# Patient Record
Sex: Male | Born: 1970 | Race: White | Hispanic: No | Marital: Single | Smoking: Never smoker
Health system: Southern US, Community
[De-identification: ages and names within clinical notes are randomized; demographics above are authoritative.]

## PROBLEM LIST (undated history)

## (undated) DIAGNOSIS — F32A Depression, unspecified: Secondary | ICD-10-CM

## (undated) DIAGNOSIS — I10 Essential (primary) hypertension: Secondary | ICD-10-CM

## (undated) DIAGNOSIS — G809 Cerebral palsy, unspecified: Secondary | ICD-10-CM

## (undated) DIAGNOSIS — F259 Schizoaffective disorder, unspecified: Secondary | ICD-10-CM

## (undated) DIAGNOSIS — E119 Type 2 diabetes mellitus without complications: Secondary | ICD-10-CM

## (undated) DIAGNOSIS — F329 Major depressive disorder, single episode, unspecified: Secondary | ICD-10-CM

## (undated) DIAGNOSIS — N200 Calculus of kidney: Secondary | ICD-10-CM

## (undated) DIAGNOSIS — M419 Scoliosis, unspecified: Secondary | ICD-10-CM

## (undated) HISTORY — PX: BOWEL RESECTION: SHX1257

---

## 1999-02-05 ENCOUNTER — Encounter: Admission: RE | Admit: 1999-02-05 | Discharge: 1999-05-06 | Payer: Self-pay | Admitting: Internal Medicine

## 2005-04-29 ENCOUNTER — Other Ambulatory Visit: Payer: Self-pay

## 2005-04-29 ENCOUNTER — Emergency Department: Payer: Self-pay | Admitting: Emergency Medicine

## 2005-08-20 ENCOUNTER — Inpatient Hospital Stay: Payer: Self-pay | Admitting: Internal Medicine

## 2005-08-21 ENCOUNTER — Other Ambulatory Visit: Payer: Self-pay

## 2005-08-30 ENCOUNTER — Inpatient Hospital Stay (HOSPITAL_COMMUNITY): Admission: AD | Admit: 2005-08-30 | Discharge: 2005-09-07 | Payer: Self-pay | Admitting: Critical Care Medicine

## 2005-08-31 ENCOUNTER — Ambulatory Visit: Payer: Self-pay | Admitting: Pulmonary Disease

## 2005-09-09 ENCOUNTER — Ambulatory Visit: Payer: Self-pay | Admitting: Pulmonary Disease

## 2005-09-14 ENCOUNTER — Ambulatory Visit: Payer: Self-pay | Admitting: Critical Care Medicine

## 2005-09-22 ENCOUNTER — Emergency Department: Payer: Self-pay | Admitting: Emergency Medicine

## 2005-09-23 ENCOUNTER — Ambulatory Visit: Payer: Self-pay | Admitting: Infectious Diseases

## 2005-09-23 ENCOUNTER — Inpatient Hospital Stay (HOSPITAL_COMMUNITY): Admission: EM | Admit: 2005-09-23 | Discharge: 2005-10-08 | Payer: Self-pay | Admitting: Emergency Medicine

## 2005-10-05 ENCOUNTER — Ambulatory Visit: Payer: Self-pay | Admitting: Cardiology

## 2005-10-19 ENCOUNTER — Ambulatory Visit: Payer: Self-pay | Admitting: Critical Care Medicine

## 2005-10-22 ENCOUNTER — Ambulatory Visit: Payer: Self-pay | Admitting: Critical Care Medicine

## 2005-10-27 ENCOUNTER — Ambulatory Visit: Payer: Self-pay | Admitting: Pulmonary Disease

## 2005-12-03 ENCOUNTER — Ambulatory Visit: Payer: Self-pay | Admitting: Pulmonary Disease

## 2006-01-19 ENCOUNTER — Ambulatory Visit: Payer: Self-pay | Admitting: Pulmonary Disease

## 2006-03-15 ENCOUNTER — Ambulatory Visit: Payer: Self-pay | Admitting: Pulmonary Disease

## 2006-04-11 ENCOUNTER — Ambulatory Visit: Payer: Self-pay | Admitting: Internal Medicine

## 2006-04-22 ENCOUNTER — Ambulatory Visit (HOSPITAL_BASED_OUTPATIENT_CLINIC_OR_DEPARTMENT_OTHER): Admission: RE | Admit: 2006-04-22 | Discharge: 2006-04-22 | Payer: Self-pay | Admitting: Pulmonary Disease

## 2006-04-22 ENCOUNTER — Ambulatory Visit: Payer: Self-pay | Admitting: Pulmonary Disease

## 2006-05-02 ENCOUNTER — Ambulatory Visit: Payer: Self-pay | Admitting: Pulmonary Disease

## 2006-05-12 ENCOUNTER — Ambulatory Visit: Payer: Self-pay | Admitting: Internal Medicine

## 2006-06-10 ENCOUNTER — Ambulatory Visit: Payer: Self-pay | Admitting: Pulmonary Disease

## 2006-06-10 ENCOUNTER — Ambulatory Visit (HOSPITAL_BASED_OUTPATIENT_CLINIC_OR_DEPARTMENT_OTHER): Admission: RE | Admit: 2006-06-10 | Discharge: 2006-06-10 | Payer: Self-pay | Admitting: Pulmonary Disease

## 2006-06-11 ENCOUNTER — Ambulatory Visit: Payer: Self-pay | Admitting: Internal Medicine

## 2006-06-29 ENCOUNTER — Ambulatory Visit: Payer: Self-pay | Admitting: Pulmonary Disease

## 2006-06-29 IMAGING — CT CT CHEST W/ CM
2 of 4 series · 15 of 36 positions shown, 18 images · IV contrast (100 ML OMNI 300)
Comparison: none

CLINICAL DATA: Refractory pneumonia and persistent fevers.  Shortness of breath.  Evaluate for obstructing mass or adenopathy.
CHEST CT WITH CONTRAST:
TECHNIQUE: Multidetector CT imaging of the chest was performed following the standard protocol during bolus administration of intravenous contrast.
Contrast:  100 cc Omnipaque 300.

[Series 2: routine chest · axial · 0.88mm/px · z∈[-388,-68]mm · 12 of 76 slices shown, 15 images]
[im 6/76  mediastinal]
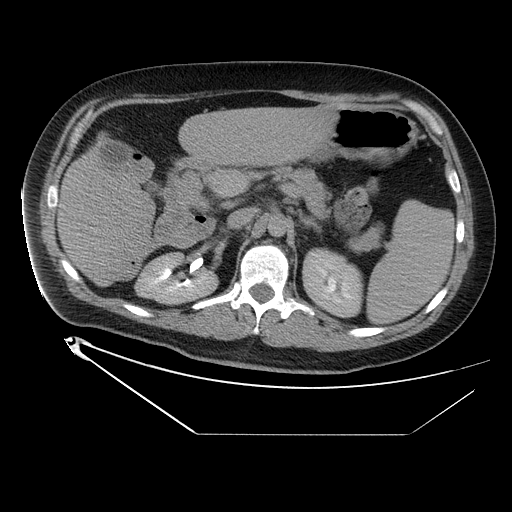
[im 6/76  lung]
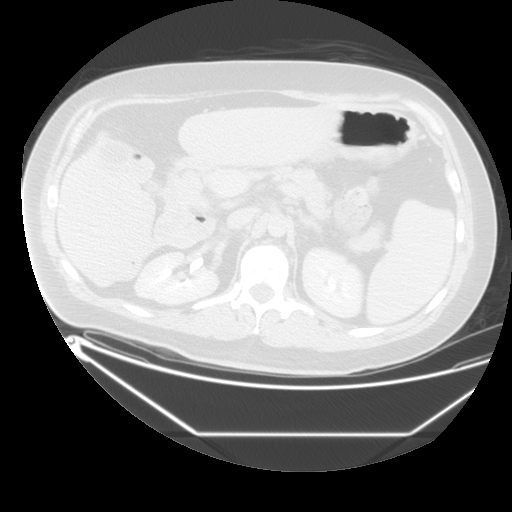
[im 12/76  lung]
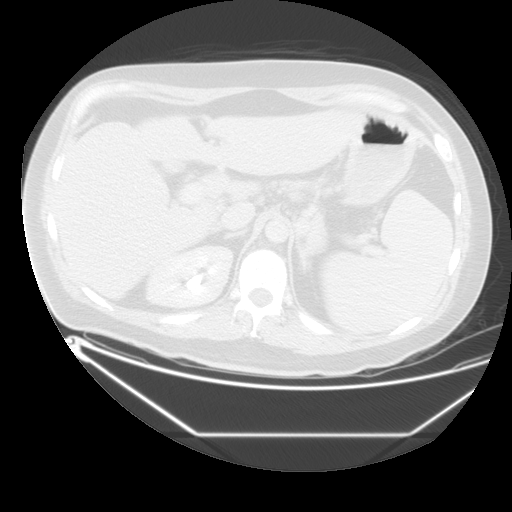
[im 18/76  lung]
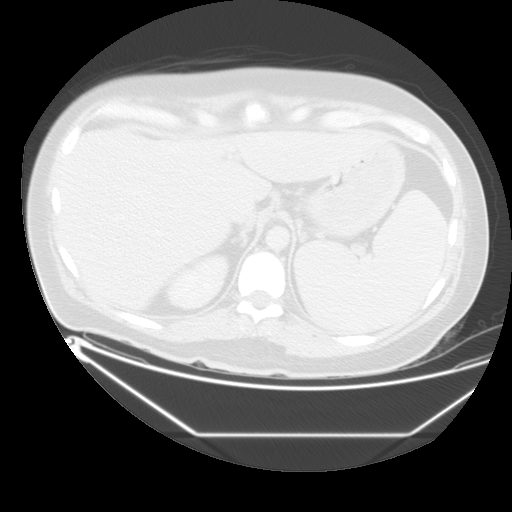
[im 24/76  lung]
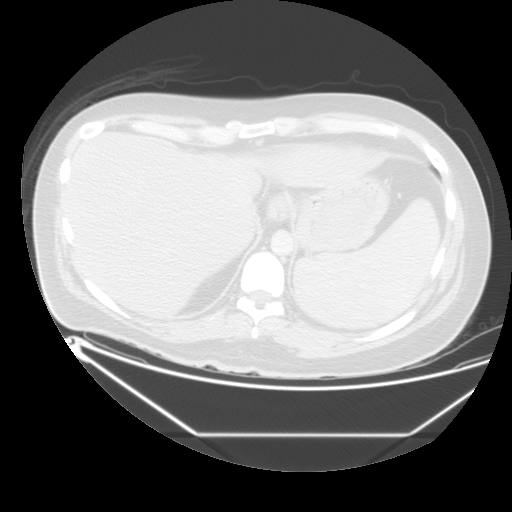
[im 29/76  mediastinal]
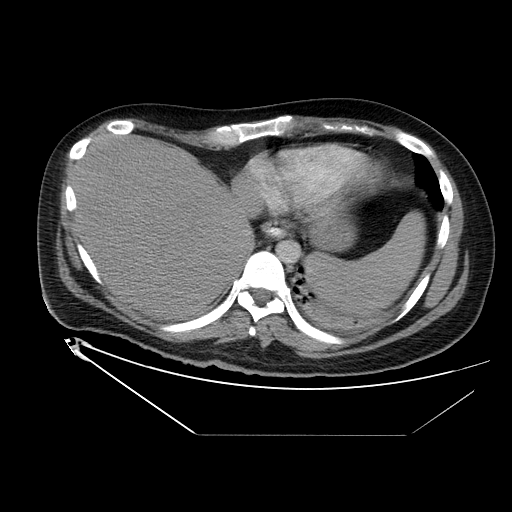
[im 29/76  lung]
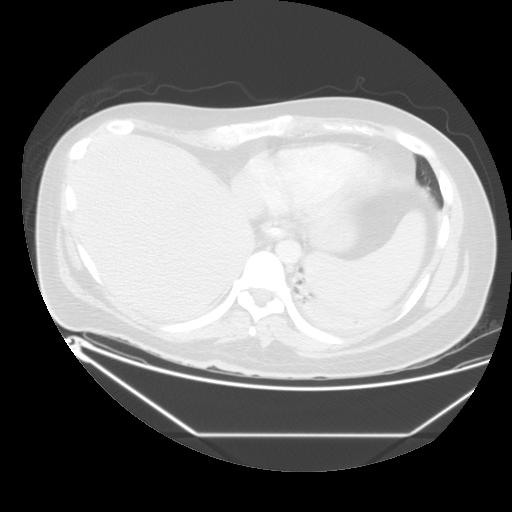
[im 35/76  lung]
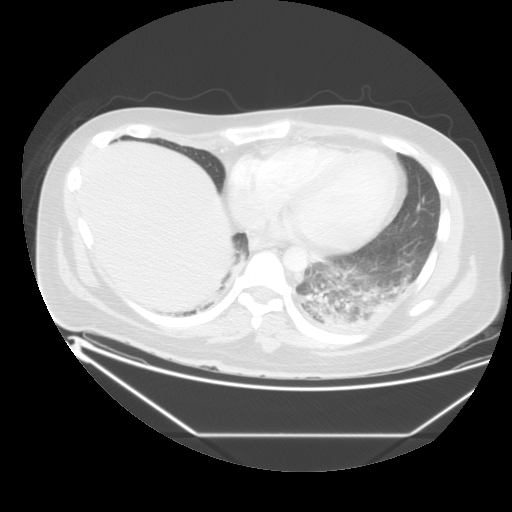
[im 41/76  lung]
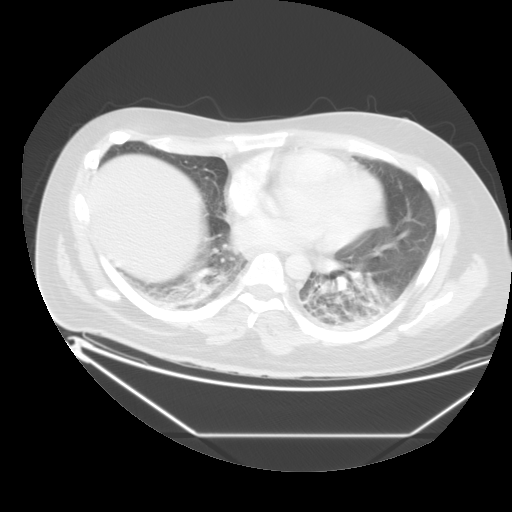
[im 47/76  lung]
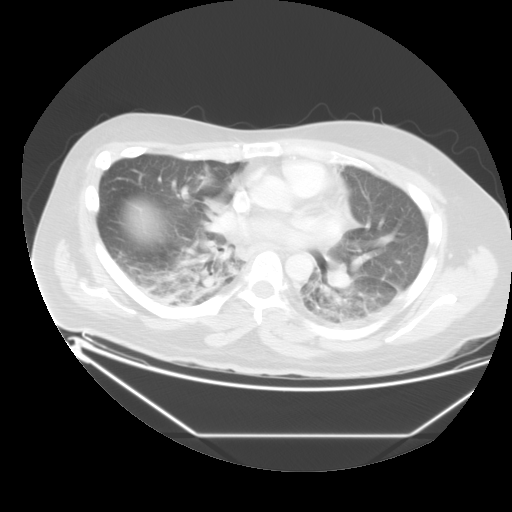
[im 52/76  mediastinal]
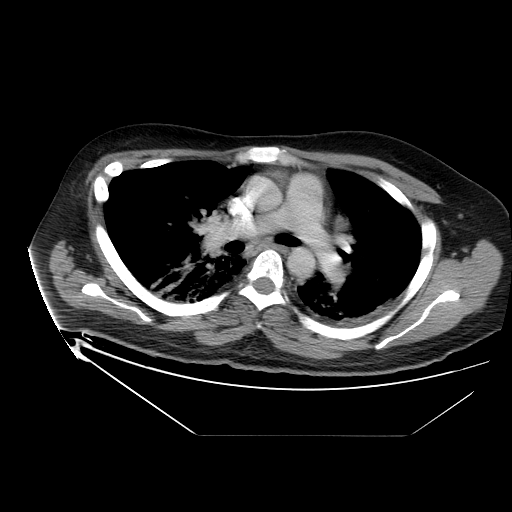
[im 52/76  lung]
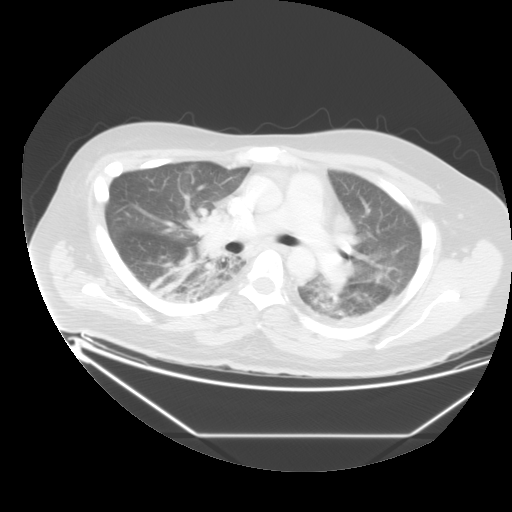
[im 58/76  lung]
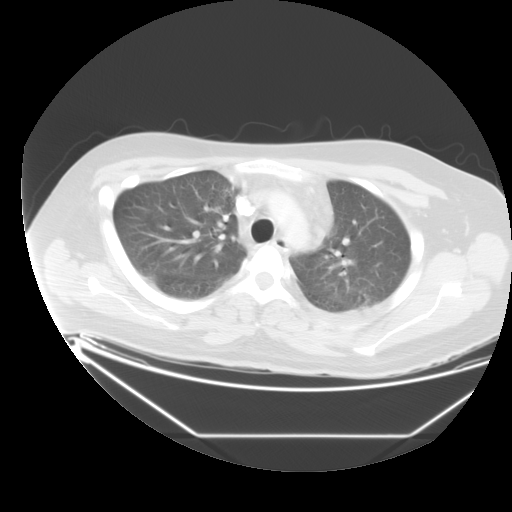
[im 64/76  lung]
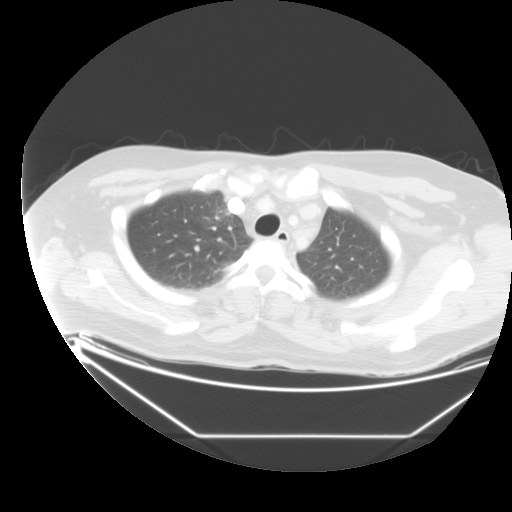
[im 70/76  lung]
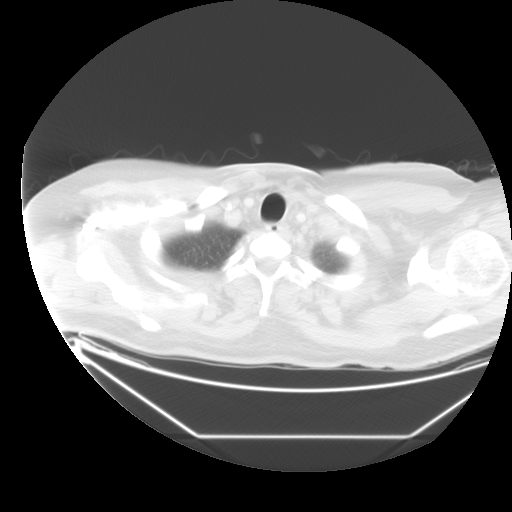

[Series 104: reformatted · coronal · 0.88mm/px · 3 of 141 slices shown]
[im 29/141  lung]
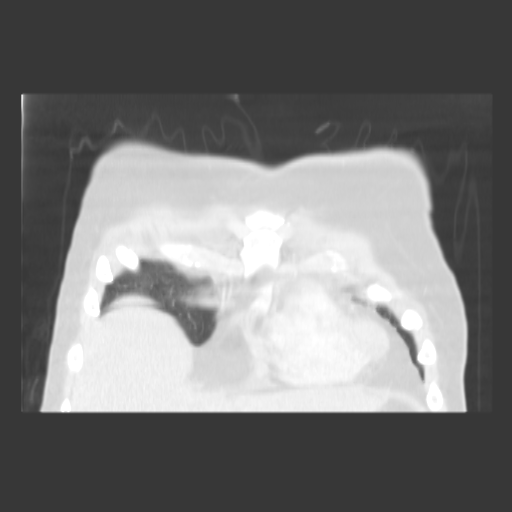
[im 57/141  lung]
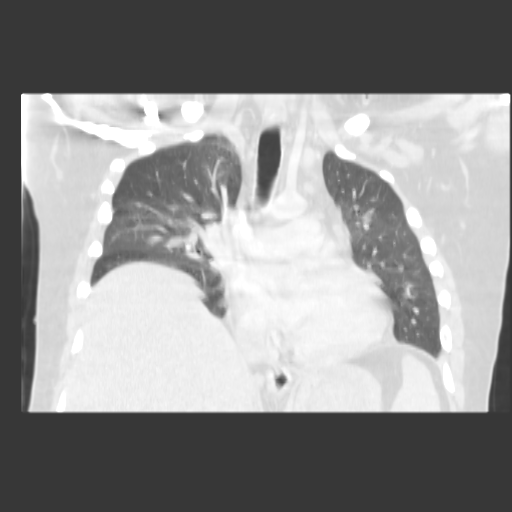
[im 85/141  lung]
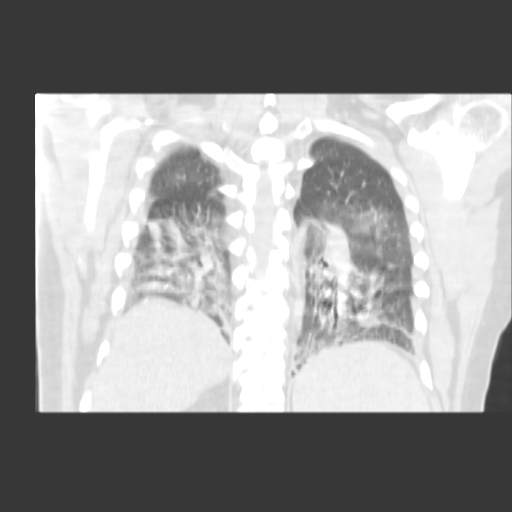

[15 of 36 positions shown; findings below may reference images not displayed]

FINDINGS: Bilateral airspace disease is seen within the lower lobes bilaterally which is relatively symmetric.  This may be due to infection or edema.  There is no evidence of pleural effusion.  
The central tracheal bronchial airways are patent and there is no evidence of centrally obstructing mass.  Small left hilar lymph nodes are seen as well as numerous tiny less than 1 cm mediastinal lymph nodes in the left lateral aortic region which are nonspecific and may be reactive.  There is no evidence of axillary adenopathy or chest wall mass.  
Images through the upper abdomen show splenomegaly.  No upper abdominal mass is noted.  Shotty less than 1 cm lymph nodes are seen in the gastrohepatic ligament which are also nonspecific.
IMPRESSION: 1.  Bilateral lower lobe infiltrates.  No evidence of centrally obstructing mass.
2.  Shotty mediastinal and left hilar adenopathy which is nonspecific and may be reactive.  Shotty gastrohepatic lymph nodes are also seen within the upper abdomen which are nonspecific.
3.  Splenomegaly.

## 2006-07-03 IMAGING — CR DG CHEST 2V
2 series · 2 of 2 positions shown · non-contrast
Comparison: none

CLINICAL DATA: Weakness, fever, follow-up pneumonia.
 CHEST - 2 VIEW:

[w chest pa]
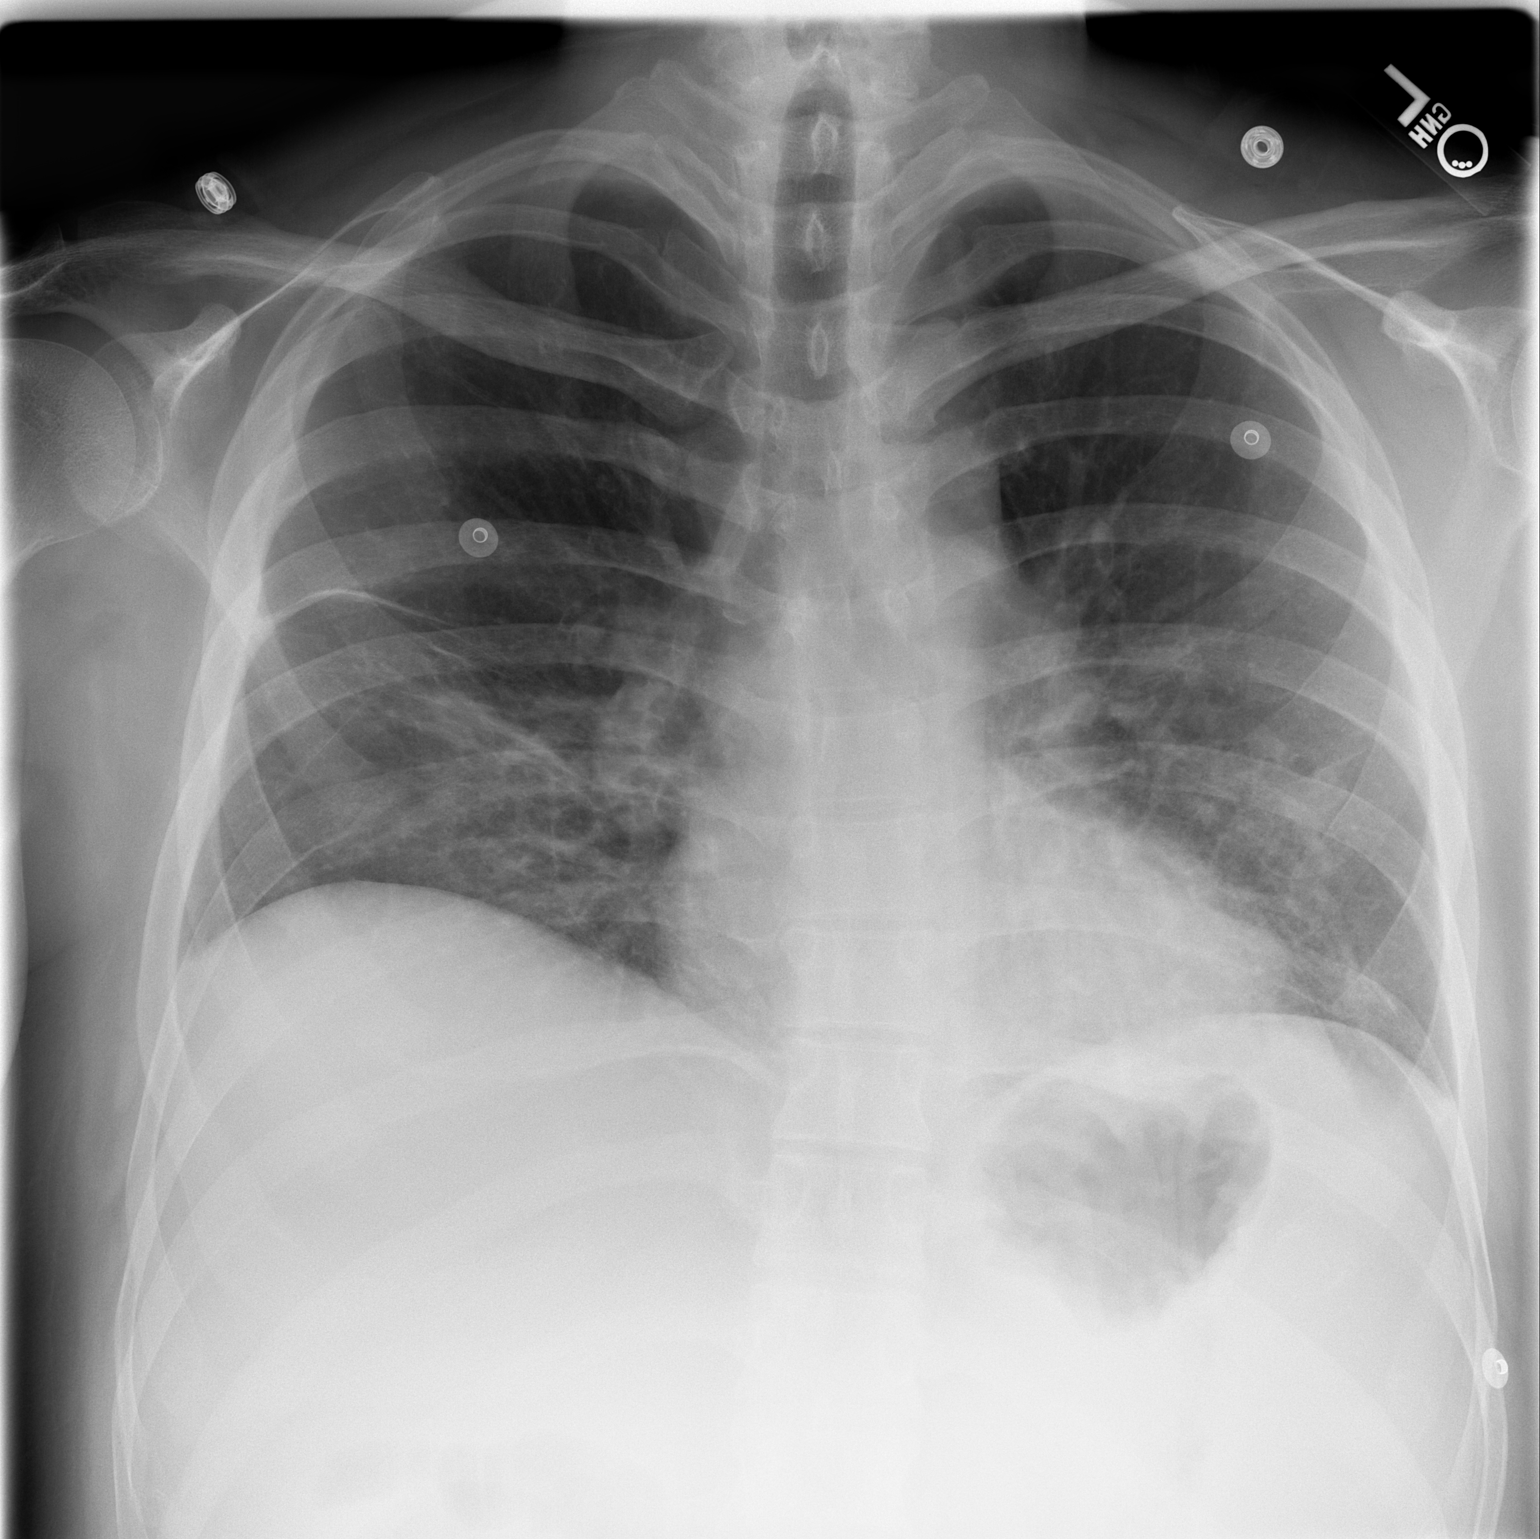

[w chest lat]
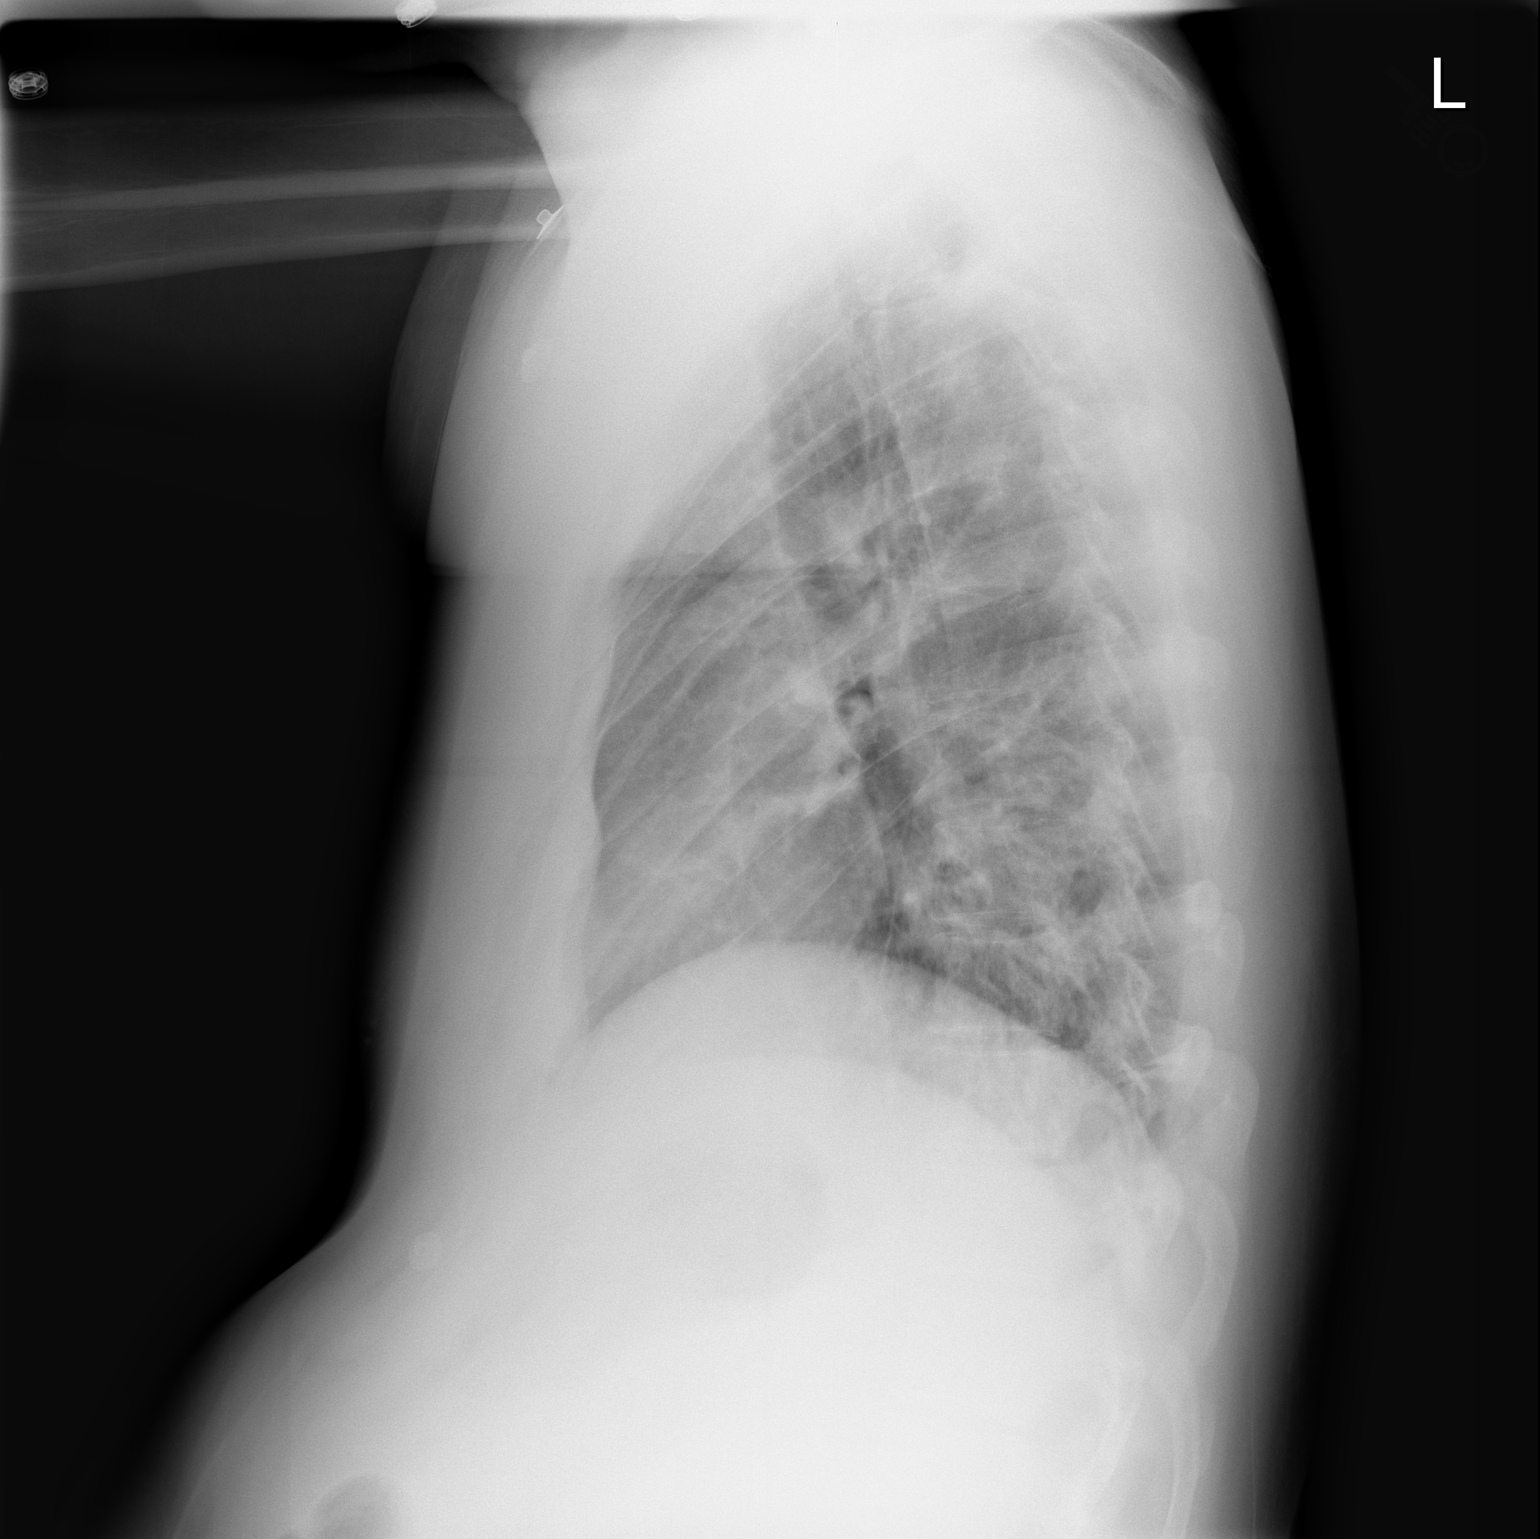

[2 of 2 positions shown; findings below may reference images not displayed]

FINDINGS: PA and lateral views of the chest are made and are compared to the previous studies of 09/25/05 and show slight improvement in left lung aeration.  There remains bilateral lower lobe peribronchial thickening, air space disease, and scarring, as well as atelectasis.  The heart and mediastinum are within the limits of normal.
IMPRESSION: No significant interval change.  There remains air space disease, scarring, and atelectasis of both lower lung fields.

## 2006-07-06 IMAGING — CR DG CHEST 1V PORT
2 series · 2 of 2 positions shown · non-contrast
Comparison: 09/29/05.

CLINICAL DATA: Pneumonia.  Status-post right VATS with central line and right chest tube placement. 
 PORTABLE CHEST:

[view not recorded (1 of 2)]
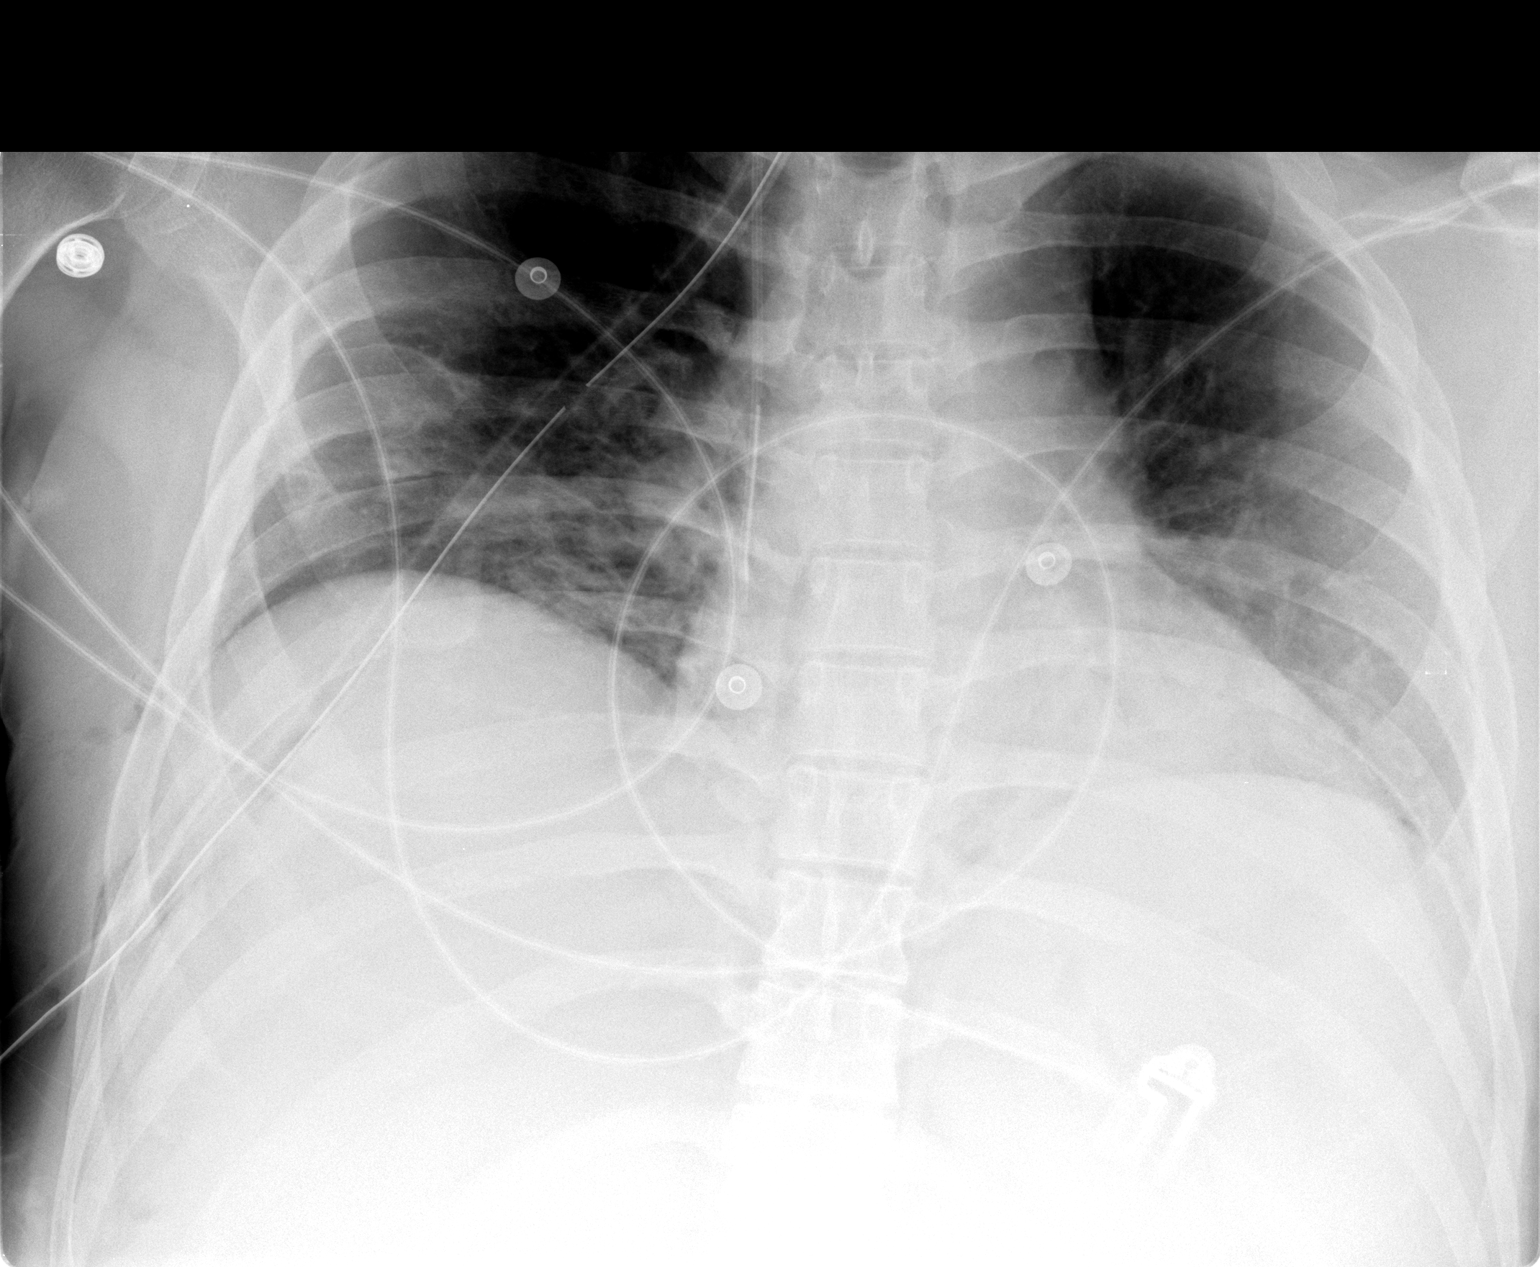

[view not recorded (2 of 2)]
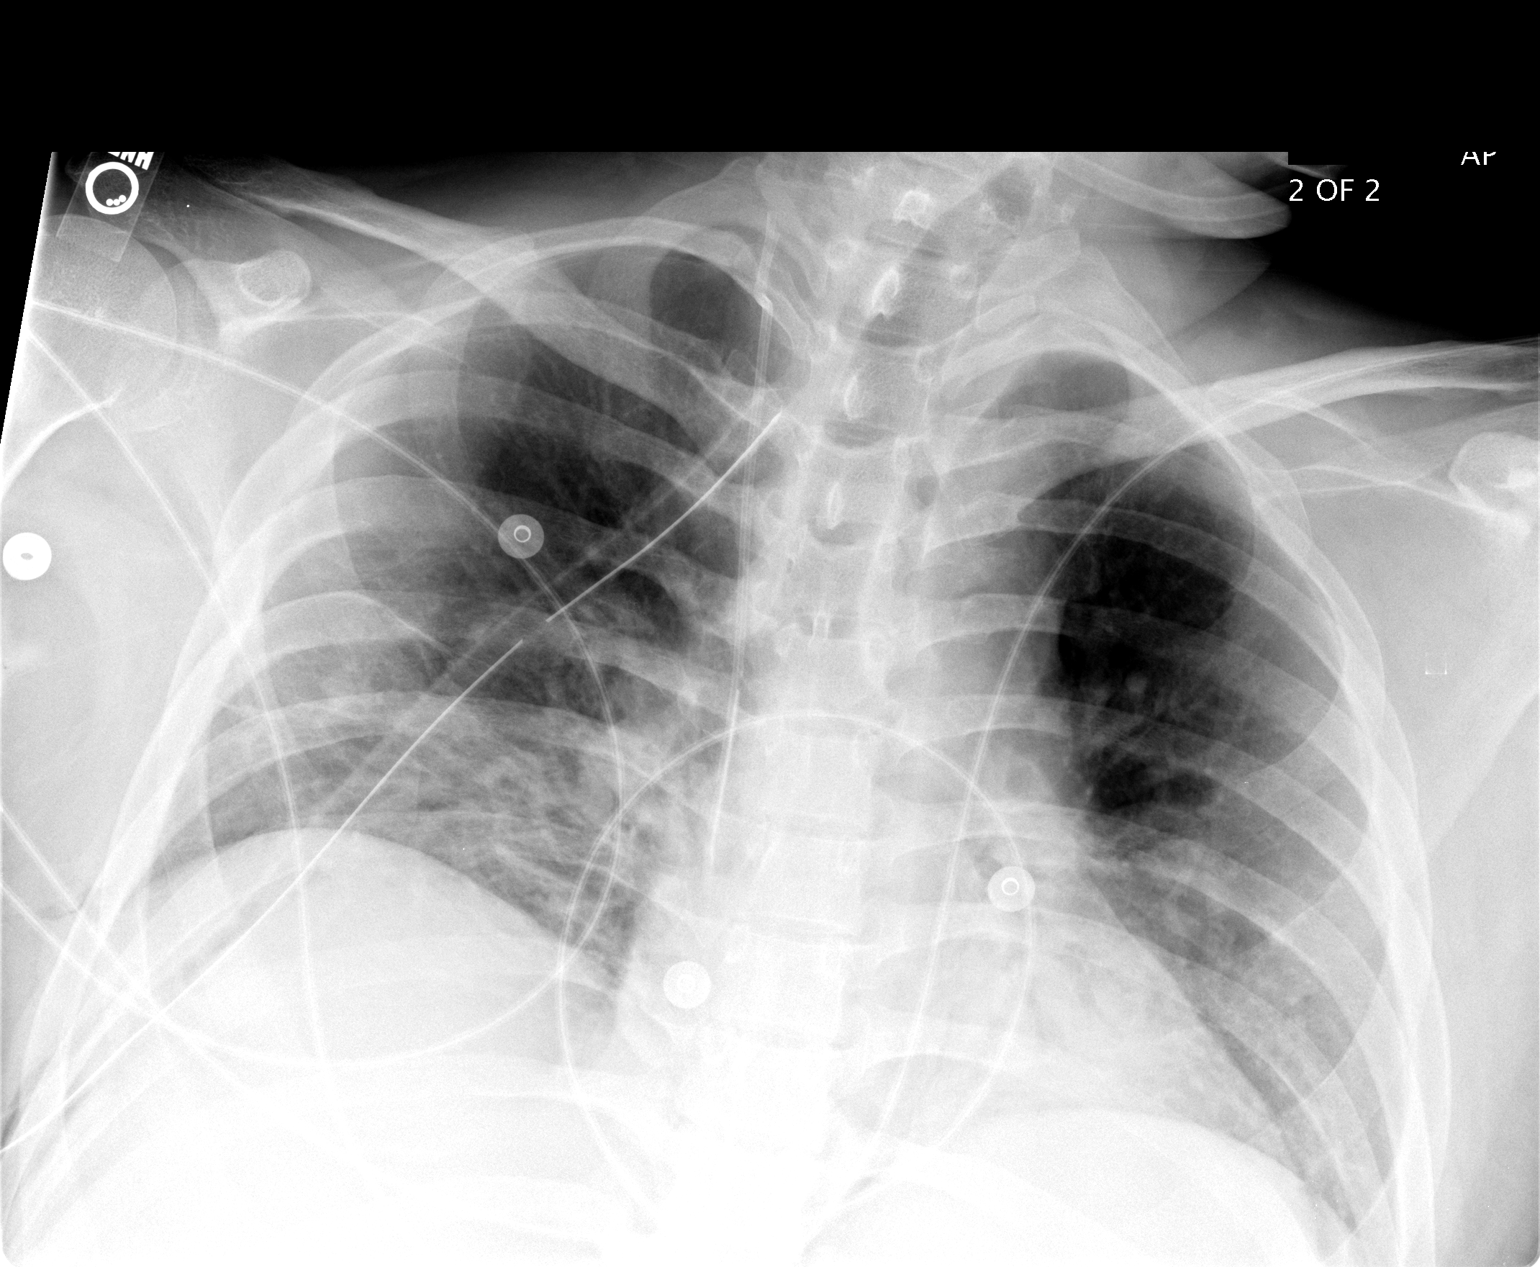

[2 of 2 positions shown; findings below may reference images not displayed]

FINDINGS: Patient has a right chest tube in place.  There is also a right internal jugular central venous catheter with the tip at the superior cavoatrial junction.  There is a tiny right apical pneumothorax.  Patchy bibasilar airspace disease persists without marked change.
IMPRESSION: 1.   Right chest tube and right IJ catheter as above with a tiny right apical pneumothorax.  
 2.  No change in bibasilar aeration disturbance.

## 2006-09-12 ENCOUNTER — Ambulatory Visit: Payer: Self-pay | Admitting: Internal Medicine

## 2006-10-11 ENCOUNTER — Ambulatory Visit: Payer: Self-pay | Admitting: Internal Medicine

## 2007-05-03 DIAGNOSIS — I10 Essential (primary) hypertension: Secondary | ICD-10-CM | POA: Insufficient documentation

## 2007-05-03 DIAGNOSIS — J690 Pneumonitis due to inhalation of food and vomit: Secondary | ICD-10-CM | POA: Insufficient documentation

## 2007-05-03 DIAGNOSIS — E119 Type 2 diabetes mellitus without complications: Secondary | ICD-10-CM | POA: Insufficient documentation

## 2007-05-03 DIAGNOSIS — Q676 Pectus excavatum: Secondary | ICD-10-CM | POA: Insufficient documentation

## 2007-05-03 DIAGNOSIS — F209 Schizophrenia, unspecified: Secondary | ICD-10-CM | POA: Insufficient documentation

## 2007-07-03 ENCOUNTER — Ambulatory Visit: Payer: Self-pay | Admitting: Internal Medicine

## 2007-07-13 ENCOUNTER — Ambulatory Visit: Payer: Self-pay | Admitting: Internal Medicine

## 2007-08-13 ENCOUNTER — Ambulatory Visit: Payer: Self-pay | Admitting: Internal Medicine

## 2008-12-20 ENCOUNTER — Ambulatory Visit: Payer: Self-pay | Admitting: Gastroenterology

## 2009-03-25 ENCOUNTER — Ambulatory Visit: Payer: Self-pay | Admitting: Gastroenterology

## 2009-04-10 ENCOUNTER — Ambulatory Visit: Payer: Self-pay | Admitting: Gastroenterology

## 2010-08-02 ENCOUNTER — Encounter: Payer: Self-pay | Admitting: Internal Medicine

## 2010-10-02 ENCOUNTER — Emergency Department: Payer: Self-pay | Admitting: Emergency Medicine

## 2012-01-24 ENCOUNTER — Emergency Department: Payer: Self-pay | Admitting: Emergency Medicine

## 2012-01-24 LAB — CBC
HCT: 42.4 % (ref 40.0–52.0)
MCHC: 33.4 g/dL (ref 32.0–36.0)
MCV: 81 fL (ref 80–100)
Platelet: 190 10*3/uL (ref 150–440)
RDW: 15.3 % — ABNORMAL HIGH (ref 11.5–14.5)
WBC: 6 10*3/uL (ref 3.8–10.6)

## 2012-01-24 LAB — URINALYSIS, COMPLETE
Bacteria: NONE SEEN
Bilirubin,UR: NEGATIVE
Blood: NEGATIVE
Glucose,UR: NEGATIVE mg/dL (ref 0–75)
Leukocyte Esterase: NEGATIVE
Nitrite: NEGATIVE
RBC,UR: 8 /HPF (ref 0–5)
Squamous Epithelial: <1
WBC UR: 4 /HPF (ref 0–5)

## 2012-01-24 LAB — COMPREHENSIVE METABOLIC PANEL
Albumin: 4.5 g/dL (ref 3.4–5.0)
Alkaline Phosphatase: 116 U/L (ref 50–136)
Anion Gap: 9 (ref 7–16)
Bilirubin,Total: 0.5 mg/dL (ref 0.2–1.0)
Calcium, Total: 9.5 mg/dL (ref 8.5–10.1)
Co2: 23 mmol/L (ref 21–32)
Creatinine: 0.83 mg/dL (ref 0.60–1.30)
EGFR (Non-African Amer.): >60
Osmolality: 280 (ref 275–301)
Potassium: 4.5 mmol/L (ref 3.5–5.1)
Sodium: 140 mmol/L (ref 136–145)

## 2012-11-21 ENCOUNTER — Emergency Department: Payer: Self-pay | Admitting: Emergency Medicine

## 2012-11-21 LAB — URINALYSIS, COMPLETE
Bacteria: NONE SEEN
Blood: NEGATIVE
Glucose,UR: NEGATIVE mg/dL (ref 0–75)
Ph: 6 (ref 4.5–8.0)
Protein: NEGATIVE
RBC,UR: NONE SEEN /HPF (ref 0–5)
Specific Gravity: 1.019 (ref 1.003–1.030)
WBC UR: NONE SEEN /HPF (ref 0–5)

## 2012-12-14 ENCOUNTER — Emergency Department: Payer: Self-pay | Admitting: Emergency Medicine

## 2012-12-14 LAB — COMPREHENSIVE METABOLIC PANEL
Alkaline Phosphatase: 79 U/L (ref 50–136)
BUN: 17 mg/dL (ref 7–18)
Chloride: 107 mmol/L (ref 98–107)
EGFR (African American): >60
EGFR (Non-African Amer.): >60
Glucose: 167 mg/dL — ABNORMAL HIGH (ref 65–99)
Potassium: 4 mmol/L (ref 3.5–5.1)
SGOT(AST): 25 U/L (ref 15–37)
SGPT (ALT): 26 U/L (ref 12–78)
Sodium: 139 mmol/L (ref 136–145)
Total Protein: 6.3 g/dL — ABNORMAL LOW (ref 6.4–8.2)

## 2012-12-14 LAB — CBC WITH DIFFERENTIAL/PLATELET
Lymphocyte #: 1.1 10*3/uL (ref 1.0–3.6)
Lymphocyte %: 18.2 %
MCH: 27.5 pg (ref 26.0–34.0)
MCHC: 34.3 g/dL (ref 32.0–36.0)
MCV: 80 fL (ref 80–100)
Monocyte #: 0.5 x10 3/mm (ref 0.2–1.0)
Monocyte %: 8.6 %
Neutrophil #: 4.2 10*3/uL (ref 1.4–6.5)
Platelet: 120 10*3/uL — ABNORMAL LOW (ref 150–440)
RBC: 4.43 10*6/uL (ref 4.40–5.90)
WBC: 5.8 10*3/uL (ref 3.8–10.6)

## 2012-12-14 LAB — URINALYSIS, COMPLETE
Bilirubin,UR: NEGATIVE
Blood: NEGATIVE
Leukocyte Esterase: NEGATIVE
Nitrite: NEGATIVE
Ph: 5 (ref 4.5–8.0)
Protein: NEGATIVE
Specific Gravity: 1.03 (ref 1.003–1.030)

## 2012-12-14 LAB — LIPASE, BLOOD: Lipase: 411 U/L — ABNORMAL HIGH (ref 73–393)

## 2013-05-11 ENCOUNTER — Ambulatory Visit: Payer: Self-pay | Admitting: Physical Medicine and Rehabilitation

## 2013-10-13 ENCOUNTER — Other Ambulatory Visit: Payer: Self-pay

## 2013-10-13 LAB — CBC WITH DIFFERENTIAL/PLATELET
Comment - H1-Com1: NORMAL
Comment - H1-Com2: NORMAL
HCT: 40.6 % (ref 40.0–52.0)
HGB: 13.4 g/dL (ref 13.0–18.0)
LYMPHS PCT: 30 %
MCH: 26.3 pg (ref 26.0–34.0)
MCHC: 33.1 g/dL (ref 32.0–36.0)
MCV: 80 fL (ref 80–100)
MONOS PCT: 7 %
PLATELETS: 110 10*3/uL — AB (ref 150–440)
RBC: 5.1 10*6/uL (ref 4.40–5.90)
RDW: 14.5 % (ref 11.5–14.5)
Segmented Neutrophils: 63 %
WBC: 4.5 10*3/uL (ref 3.8–10.6)

## 2013-12-14 ENCOUNTER — Other Ambulatory Visit: Payer: Self-pay

## 2013-12-14 LAB — CBC WITH DIFFERENTIAL/PLATELET
BASOS PCT: 0.6 %
Basophil #: 0 10*3/uL (ref 0.0–0.1)
EOS PCT: 1 %
Eosinophil #: 0 10*3/uL (ref 0.0–0.7)
HCT: 39.8 % — AB (ref 40.0–52.0)
HGB: 13.4 g/dL (ref 13.0–18.0)
LYMPHS PCT: 38.3 %
Lymphocyte #: 1.8 10*3/uL (ref 1.0–3.6)
MCH: 26.6 pg (ref 26.0–34.0)
MCHC: 33.5 g/dL (ref 32.0–36.0)
MCV: 79 fL — ABNORMAL LOW (ref 80–100)
MONO ABS: 0.3 x10 3/mm (ref 0.2–1.0)
Monocyte %: 6.9 %
NEUTROS PCT: 53.2 %
Neutrophil #: 2.5 10*3/uL (ref 1.4–6.5)
Platelet: 130 10*3/uL — ABNORMAL LOW (ref 150–440)
RBC: 5.03 10*6/uL (ref 4.40–5.90)
RDW: 14.4 % (ref 11.5–14.5)
WBC: 4.7 10*3/uL (ref 3.8–10.6)

## 2013-12-25 DIAGNOSIS — E039 Hypothyroidism, unspecified: Secondary | ICD-10-CM | POA: Insufficient documentation

## 2013-12-31 DIAGNOSIS — D509 Iron deficiency anemia, unspecified: Secondary | ICD-10-CM | POA: Insufficient documentation

## 2014-04-02 DIAGNOSIS — E781 Pure hyperglyceridemia: Secondary | ICD-10-CM | POA: Insufficient documentation

## 2014-04-02 DIAGNOSIS — Z794 Long term (current) use of insulin: Secondary | ICD-10-CM | POA: Insufficient documentation

## 2014-04-16 DIAGNOSIS — R9431 Abnormal electrocardiogram [ECG] [EKG]: Secondary | ICD-10-CM | POA: Insufficient documentation

## 2015-07-04 ENCOUNTER — Emergency Department
Admission: EM | Admit: 2015-07-04 | Discharge: 2015-07-05 | Disposition: A | Discharge status: Other Acute Inpatient Hospital | Payer: Medicare Other | Attending: Emergency Medicine | Admitting: Emergency Medicine

## 2015-07-04 ENCOUNTER — Encounter: Payer: Self-pay | Admitting: Emergency Medicine

## 2015-07-04 DIAGNOSIS — Z91040 Latex allergy status: Secondary | ICD-10-CM | POA: Insufficient documentation

## 2015-07-04 DIAGNOSIS — F200 Paranoid schizophrenia: Secondary | ICD-10-CM | POA: Diagnosis not present

## 2015-07-04 DIAGNOSIS — E119 Type 2 diabetes mellitus without complications: Secondary | ICD-10-CM | POA: Insufficient documentation

## 2015-07-04 DIAGNOSIS — F4310 Post-traumatic stress disorder, unspecified: Secondary | ICD-10-CM | POA: Diagnosis not present

## 2015-07-04 DIAGNOSIS — F329 Major depressive disorder, single episode, unspecified: Secondary | ICD-10-CM | POA: Diagnosis not present

## 2015-07-04 DIAGNOSIS — I10 Essential (primary) hypertension: Secondary | ICD-10-CM | POA: Diagnosis not present

## 2015-07-04 DIAGNOSIS — F419 Anxiety disorder, unspecified: Secondary | ICD-10-CM | POA: Diagnosis present

## 2015-07-04 HISTORY — DX: Cerebral palsy, unspecified: G80.9

## 2015-07-04 HISTORY — DX: Schizoaffective disorder, unspecified: F25.9

## 2015-07-04 HISTORY — DX: Depression, unspecified: F32.A

## 2015-07-04 HISTORY — DX: Type 2 diabetes mellitus without complications: E11.9

## 2015-07-04 HISTORY — DX: Scoliosis, unspecified: M41.9

## 2015-07-04 HISTORY — DX: Essential (primary) hypertension: I10

## 2015-07-04 HISTORY — DX: Calculus of kidney: N20.0

## 2015-07-04 HISTORY — DX: Major depressive disorder, single episode, unspecified: F32.9

## 2015-07-04 LAB — CBC WITH DIFFERENTIAL/PLATELET
BASOS ABS: 0 10*3/uL (ref 0–0.1)
BASOS PCT: 1 %
EOS ABS: 0.1 10*3/uL (ref 0–0.7)
EOS PCT: 1 %
HCT: 38 % — ABNORMAL LOW (ref 40.0–52.0)
Hemoglobin: 12.6 g/dL — ABNORMAL LOW (ref 13.0–18.0)
LYMPHS PCT: 33 %
Lymphs Abs: 1.6 10*3/uL (ref 1.0–3.6)
MCH: 26.6 pg (ref 26.0–34.0)
MCHC: 33.1 g/dL (ref 32.0–36.0)
MCV: 80.5 fL (ref 80.0–100.0)
MONO ABS: 0.3 10*3/uL (ref 0.2–1.0)
Monocytes Relative: 6 %
Neutro Abs: 3 10*3/uL (ref 1.4–6.5)
Neutrophils Relative %: 59 %
Platelets: 155 10*3/uL (ref 150–440)
RBC: 4.73 MIL/uL (ref 4.40–5.90)
RDW: 14.5 % (ref 11.5–14.5)
WBC: 5 10*3/uL (ref 3.8–10.6)

## 2015-07-04 LAB — URINE DRUG SCREEN, QUALITATIVE (ARMC ONLY)
AMPHETAMINES, UR SCREEN: NOT DETECTED
BARBITURATES, UR SCREEN: NOT DETECTED
BENZODIAZEPINE, UR SCRN: NOT DETECTED
Cannabinoid 50 Ng, Ur ~~LOC~~: NOT DETECTED
Cocaine Metabolite,Ur ~~LOC~~: NOT DETECTED
MDMA (Ecstasy)Ur Screen: NOT DETECTED
METHADONE SCREEN, URINE: NOT DETECTED
Opiate, Ur Screen: NOT DETECTED
Phencyclidine (PCP) Ur S: NOT DETECTED
TRICYCLIC, UR SCREEN: NOT DETECTED

## 2015-07-04 LAB — COMPREHENSIVE METABOLIC PANEL
ALK PHOS: 71 U/L (ref 38–126)
ALT: 40 U/L (ref 17–63)
AST: 33 U/L (ref 15–41)
Albumin: 4.3 g/dL (ref 3.5–5.0)
Anion gap: 6 (ref 5–15)
BILIRUBIN TOTAL: 0.6 mg/dL (ref 0.3–1.2)
BUN: 10 mg/dL (ref 6–20)
CALCIUM: 8.9 mg/dL (ref 8.9–10.3)
CO2: 22 mmol/L (ref 22–32)
Chloride: 109 mmol/L (ref 101–111)
Creatinine, Ser: 0.71 mg/dL (ref 0.61–1.24)
GFR calc Af Amer: >60 mL/min (ref >60)
GFR calc non Af Amer: >60 mL/min (ref >60)
Glucose, Bld: 276 mg/dL — ABNORMAL HIGH (ref 65–99)
Potassium: 5.8 mmol/L — ABNORMAL HIGH (ref 3.5–5.1)
Sodium: 137 mmol/L (ref 135–145)
TOTAL PROTEIN: 7.1 g/dL (ref 6.5–8.1)

## 2015-07-04 LAB — ETHANOL

## 2015-07-04 MED ORDER — LORAZEPAM 2 MG PO TABS
2.0000 mg | ORAL_TABLET | Freq: Once | ORAL | Status: AC
  Administered 2015-07-04: 2 mg via ORAL
  Filled 2015-07-04: qty 1
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
## 2015-07-04 NOTE — ED Notes (Signed)
Patient changed into hospital scrubs by this RN and tech per hospital policy. [facility] by this RN.

## 2015-07-05 DIAGNOSIS — F419 Anxiety disorder, unspecified: Secondary | ICD-10-CM | POA: Diagnosis not present

## 2015-07-05 DIAGNOSIS — F200 Paranoid schizophrenia: Secondary | ICD-10-CM | POA: Insufficient documentation

## 2015-07-05 LAB — GLUCOSE, CAPILLARY
GLUCOSE-CAPILLARY: 229 mg/dL — AB (ref 65–99)
GLUCOSE-CAPILLARY: 226 mg/dL — AB (ref 65–99)

## 2015-07-05 MED ORDER — ARIPIPRAZOLE 5 MG PO TABS
5.0000 mg | ORAL_TABLET | Freq: Every day | ORAL | Status: DC
  Administered 2015-07-05: 5 mg via ORAL
  Filled 2015-07-05: qty 1

## 2015-07-05 MED ORDER — METFORMIN HCL 500 MG PO TABS: 1000.0000 mg | ORAL_TABLET | Freq: Two times a day (BID) | ORAL | Status: DC

## 2015-07-05 MED ORDER — DIVALPROEX SODIUM 500 MG PO DR TAB
1000.0000 mg | DELAYED_RELEASE_TABLET | Freq: Two times a day (BID) | ORAL | Status: DC
  Administered 2015-07-05: 1000 mg via ORAL
  Filled 2015-07-05: qty 2

## 2015-07-05 MED ORDER — INSULIN ASPART PROT & ASPART (70-30 MIX) 100 UNIT/ML ~~LOC~~ SUSP
42.0000 [IU] | Freq: Once | SUBCUTANEOUS | Status: AC
  Administered 2015-07-05: 42 [IU] via SUBCUTANEOUS
  Filled 2015-07-05: qty 1

## 2015-07-05 MED ORDER — LEVOTHYROXINE SODIUM 100 MCG PO TABS: 100.0000 ug | ORAL_TABLET | Freq: Every day | ORAL | Status: DC

## 2015-07-05 MED ORDER — DIVALPROEX SODIUM 500 MG PO DR TAB: 500.0000 mg | DELAYED_RELEASE_TABLET | Freq: Four times a day (QID) | ORAL | Status: DC

## 2015-07-05 MED ORDER — LISINOPRIL 10 MG PO TABS
10.0000 mg | ORAL_TABLET | Freq: Once | ORAL | Status: AC
  Administered 2015-07-05: 10 mg via ORAL
  Filled 2015-07-05: qty 1

## 2015-07-05 MED ORDER — SIMVASTATIN 40 MG PO TABS: 20.0000 mg | ORAL_TABLET | Freq: Every day | ORAL | Status: DC

## 2015-07-05 MED ORDER — PIOGLITAZONE HCL 30 MG PO TABS
30.0000 mg | ORAL_TABLET | Freq: Every day | ORAL | Status: DC
  Administered 2015-07-05: 30 mg via ORAL
  Filled 2015-07-05: qty 1

## 2015-07-05 MED ORDER — INSULIN ASPART PROT & ASPART (70-30 MIX) 100 UNIT/ML ~~LOC~~ SUSP
26.0000 [IU] | Freq: Every day | SUBCUTANEOUS | Status: DC
  Filled 2015-07-05: qty 10

## 2015-07-05 MED ORDER — METOPROLOL SUCCINATE ER 50 MG PO TB24
50.0000 mg | ORAL_TABLET | Freq: Every day | ORAL | Status: DC
  Administered 2015-07-05: 50 mg via ORAL
  Filled 2015-07-05: qty 1

## 2015-07-05 MED ORDER — GEMFIBROZIL 600 MG PO TABS: 600.0000 mg | ORAL_TABLET | Freq: Two times a day (BID) | ORAL | Status: DC

## 2015-07-05 NOTE — ED Notes (Signed)
Pt sitting in the chair in his room. Pt didn't want the tv on. Pt awaiting discharge. No complaints. Cooperative and calm. 15 min checks in progress. Monitored for safety.

## 2015-07-05 NOTE — ED Provider Notes (Signed)
-----------------------------------------   3:46 PM on 07/05/2015 -----------------------------------------  Patient is been seen by psychiatry, Dr. Alinda Sierrasbreaking. She reports he is stable, does not have suicidal or homicidal ideation, and she is released him from IVC. We will discharge him to follow-up with his usual therapist or at [facility].  Darien Ramusavid W Donavin Audino, MD 07/05/15 [phone]

## 2015-07-05 NOTE — ED Notes (Addendum)
Patient belongings given to patient upon discharge.
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
## 2015-07-05 NOTE — ED Notes (Signed)
Patient resting quietly in room. No noted distress or abnormal behaviors noted. Will continue 15 minute checks and observation by security camera for safety. 
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
## 2015-07-05 NOTE — ED Provider Notes (Signed)
-----------------------------------------   9:51 AM on 07/05/2015 -----------------------------------------   BP 113/58 mmHg  Pulse 86  Temp(Src) 97.5 F (36.4 C) (Oral)  Resp 22  Ht 6\' 4"  (1.93 m)  Wt 280 lb (127.007 kg)  BMI 34.10 kg/m2  SpO2 98%  I have reviewed the patients recent care with the nursing staff.  The patient has had no acute events since last update.  The patient has been calm and cooperative. He has multiple home medications, for both psychiatric and for diabetes, that has not been restarted. I have reviewed the list and instructed the nurse to restart his home meds. We are checking a fingerstick blood glucose level. His last was 276 last night.     Darien Ramusavid W Linzie Criss, MD 07/05/15 [phone]

## 2015-07-05 NOTE — Progress Notes (Signed)
This Clinical research associatewriter spoke with the patient's guardian Arlys Johnlona [phone] to inform the family of the disposition to discharge.  She has agreed to contact the patient's psychiatrist with [facility].  This writer spoke with Elita QuickPam with [facility] and she requested the patient contact them about transportation.      Maryelizabeth Rowanressa Camry Theiss, MSW, Clare CharonLCSW, LCAS [facility] Triage Specialist [phone]

## 2015-07-05 NOTE — ED Notes (Signed)
Psychiatrist at bedside

## 2015-07-05 NOTE — Discharge Instructions (Signed)
Continue his current medications. [facility]. Return to the emergency department if you have any urgent concerns.

## 2015-07-05 NOTE — ED Notes (Signed)
Pt is very groggy on admission and went to sleep immediately. Pt is alert and oriented and cooperative with staff and denies SI/HI and AVH. However, his interaction is minimal. Will continue to monitor.

## 2015-07-05 NOTE — ED Notes (Signed)
Patient asleep in room. No noted distress or abnormal behavior. Will continue 15 minute checks and observation by security cameras for safety. 

## 2015-07-05 NOTE — ED Notes (Signed)
Pt resting. Not interested in talking to writer, minimal information obtained. Pt denies SI/HI and AVH. Pain 7/10 in back from BHU bed. No noted distress or abnormal behaviors noted. Will continue 15 minute checks and observation by security camera for safety.

## 2015-07-05 NOTE — ED Notes (Signed)
Pt sitting in chair in room. Calm. Agreed to a 2nd finger stick. Pt stated he does check his blood sugar at home. Pt still not forthcoming with information. No noted distress or abnormal behavior. Will continue 15 minute checks and observation by security cameras for safety.

## 2015-07-05 NOTE — ED Notes (Signed)
Pt noted to be sitting up on side of bed speaking to Amy, RN. NAD noted at this time. Pt calm and cooperative.

## 2015-07-05 NOTE — ED Notes (Signed)
Pt resting in room. Compliant with medications. Cooperative. Did not speak with RN.  No noted distress or abnormal behaviors noted. Will continue 15 minute checks and observation by security camera for safety.

## 2015-07-05 NOTE — ED Notes (Signed)
Pt is taking a shower.

## 2016-04-07 ENCOUNTER — Encounter: Payer: Self-pay | Admitting: Emergency Medicine

## 2016-04-07 ENCOUNTER — Emergency Department
Admission: EM | Admit: 2016-04-07 | Discharge: 2016-04-07 | Disposition: A | Discharge status: Home / Self Care | Payer: Medicare Other | Attending: Emergency Medicine | Admitting: Emergency Medicine

## 2016-04-07 DIAGNOSIS — G809 Cerebral palsy, unspecified: Secondary | ICD-10-CM | POA: Diagnosis not present

## 2016-04-07 DIAGNOSIS — Z794 Long term (current) use of insulin: Secondary | ICD-10-CM | POA: Insufficient documentation

## 2016-04-07 DIAGNOSIS — M549 Dorsalgia, unspecified: Secondary | ICD-10-CM

## 2016-04-07 DIAGNOSIS — Z79899 Other long term (current) drug therapy: Secondary | ICD-10-CM | POA: Insufficient documentation

## 2016-04-07 DIAGNOSIS — I10 Essential (primary) hypertension: Secondary | ICD-10-CM | POA: Insufficient documentation

## 2016-04-07 DIAGNOSIS — E119 Type 2 diabetes mellitus without complications: Secondary | ICD-10-CM | POA: Diagnosis not present

## 2016-04-07 DIAGNOSIS — Z7984 Long term (current) use of oral hypoglycemic drugs: Secondary | ICD-10-CM | POA: Diagnosis not present

## 2016-04-07 DIAGNOSIS — G8929 Other chronic pain: Secondary | ICD-10-CM | POA: Insufficient documentation

## 2016-04-07 DIAGNOSIS — M7918 Myalgia, other site: Secondary | ICD-10-CM

## 2016-04-07 LAB — CBC
HEMATOCRIT: 39.2 % — AB (ref 40.0–52.0)
Hemoglobin: 13.6 g/dL (ref 13.0–18.0)
MCH: 27.2 pg (ref 26.0–34.0)
MCHC: 34.8 g/dL (ref 32.0–36.0)
MCV: 78.3 fL — AB (ref 80.0–100.0)
PLATELETS: 130 10*3/uL — AB (ref 150–440)
RBC: 5.01 MIL/uL (ref 4.40–5.90)
RDW: 14.7 % — ABNORMAL HIGH (ref 11.5–14.5)
WBC: 5.7 10*3/uL (ref 3.8–10.6)

## 2016-04-07 LAB — BASIC METABOLIC PANEL
ANION GAP: 6 (ref 5–15)
BUN: 8 mg/dL (ref 6–20)
CO2: 24 mmol/L (ref 22–32)
Calcium: 8.8 mg/dL — ABNORMAL LOW (ref 8.9–10.3)
Chloride: 109 mmol/L (ref 101–111)
Creatinine, Ser: 0.59 mg/dL — ABNORMAL LOW (ref 0.61–1.24)
GFR calc Af Amer: >60 mL/min (ref >60)
GLUCOSE: 162 mg/dL — AB (ref 65–99)
POTASSIUM: 3.8 mmol/L (ref 3.5–5.1)
Sodium: 139 mmol/L (ref 135–145)

## 2016-04-07 LAB — SALICYLATE LEVEL: Salicylate Lvl: <4 mg/dL (ref 2.8–30.0)

## 2016-04-07 LAB — ACETAMINOPHEN LEVEL
Acetaminophen (Tylenol), Serum: <10 ug/mL — ABNORMAL LOW (ref 10–30)
Acetaminophen (Tylenol), Serum: <10 ug/mL — ABNORMAL LOW (ref 10–30)

## 2016-04-07 MED ORDER — LIDOCAINE 5 % EX PTCH
1.0000 | MEDICATED_PATCH | CUTANEOUS | Status: DC
  Administered 2016-04-07: 1 via TRANSDERMAL
  Filled 2016-04-07: qty 1

## 2016-04-07 MED ORDER — KETOROLAC TROMETHAMINE 30 MG/ML IJ SOLN
30.0000 mg | Freq: Once | INTRAMUSCULAR | Status: AC
  Administered 2016-04-07: 30 mg via INTRAVENOUS
  Filled 2016-04-07: qty 1

## 2016-04-07 MED ORDER — NAPROXEN 500 MG PO TABS: 500.0000 mg | ORAL_TABLET | Freq: Two times a day (BID) | ORAL | 0 refills | Status: DC

## 2016-04-07 MED ORDER — LIDOCAINE 5 % EX PTCH: 1.0000 | MEDICATED_PATCH | Freq: Two times a day (BID) | CUTANEOUS | 0 refills | Status: DC

## 2016-04-07 NOTE — ED Provider Notes (Signed)
[facility] Emergency Department Provider Note   ____________________________________________   First MD Initiated Contact with Patient 04/07/16 0507     (approximate)  I have reviewed the triage vital signs and the nursing notes.   HISTORY  Chief Complaint Back Pain    HPI Derrick Atkins is a 45 y.o. male who comes into the hospital today in poor health. He reports that he has a history of cerebral palsy on the right side. He reports that because of this they did tests on his back. The patient has scoliosis and reports that he is in so much pain. He reports that he's been told that his nerves or damage since birth. He reports that the pain is so bad it and 11 out of 10 in intensity in his back. He reports that he didn't know what to do so he called EMS to come in. The patient reports he had a cortisone shot at some point which she cannot tell me and the pain was better for about 2 weeks but it returned. He reports that he slipped with his pain all his life and it doesn't feel good. He reports that he is having some difficulty in regards to himself emotionally and physically. He denies any suicidal ideation. The patient wants to get rid of his back pain. He took 6-8 hydrocodone last night he reports a few hours before he came into the hospital. The patient is here for evaluation.   Past Medical History:  Diagnosis Date  . Cerebral palsy (HCC)   . Depression   . Diabetes mellitus without complication (H[facility])   . Hypertension   . Kidney stones   . Schizoaffective disorder ([facility])   . Scoliosis     Patient Active Problem List   Diagnosis Date Noted  . Schizophrenia, paranoid, chronic (H[facility])   . DIABETES MELLITUS 05/03/2007  . SCHIZOPHRENIA 05/03/2007  . HYPERTENSION 05/03/2007  . PNEUMONITIS DUE TO FOOD/VOMIT INHALATION 05/03/2007  . 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 BOWEL RESECTION      Prior to  Admission medications   Medication Sig Start Date End Date Taking? Authorizing Provider  ARIPiprazole (ABILIFY) 5 MG tablet Take 1 tablet by mouth daily. 07/03/15   Historical Provider, MD  cloZAPine (CLOZARIL) 25 MG tablet Take 1 tablet by mouth 2 (two) times daily. 07/03/15   Historical Provider, MD  divalproex (DEPAKOTE) 500 MG DR tablet Take 1 tablet by mouth 4 (four) times daily. 07/03/15   Historical Provider, MD  gemfibrozil (LOPID) 600 MG tablet Take 1 tablet by mouth 2 (two) times daily. 07/03/15   Historical Provider, MD  levothyroxine (SYNTHROID, LEVOTHROID) 88 MCG tablet Take 1 tablet by mouth daily. 07/03/15   Historical Provider, MD  lisinopril (PRINIVIL,ZESTRIL) 10 MG tablet Take 1 tablet by mouth daily. 07/03/15   Historical Provider, MD  metFORMIN (GLUCOPHAGE) 500 MG tablet Take 4 tablets by mouth daily. 07/03/15   Historical Provider, MD  metoprolol succinate (TOPROL-XL) 50 MG 24 hr tablet Take 1 tablet by mouth daily. 07/03/15   Historical Provider, MD  NOVOLOG MIX 70/30 FLEXPEN (70-30) 100 UNIT/ML FlexPen 42 units before breakfast and 26 units before dinner 07/01/15   Historical Provider, MD  pioglitazone (ACTOS) 30 MG tablet Take 1 tablet by mouth daily. 07/03/15   Historical Provider, MD  simvastatin (ZOCOR) 20 MG tablet Take 1 tablet by mouth daily. 07/03/15   Historical Provider, MD  VICTOZA 18 MG/3ML SOPN  Inject 1.8 mg into the skin as directed. 07/01/15   Historical Provider, MD    Allergies Latex  History reviewed. No pertinent family history.  Social History Social History  Substance Use Topics  . Smoking status: Never Smoker  . Smokeless tobacco: Never Used  . Alcohol use No    Review of Systems Constitutional: No fever/chills Eyes: No visual changes. ENT: No sore throat. Cardiovascular: Denies chest pain. Respiratory: Denies shortness of breath. Gastrointestinal: No abdominal pain.  No nausea, no vomiting.  No diarrhea.  No  constipation. Genitourinary: Negative for dysuria. Musculoskeletal:  back pain. Skin: Negative for rash. Neurological: Negative for headaches, focal weakness or numbness.  10-point ROS otherwise negative.  ____________________________________________   PHYSICAL EXAM:  VITAL SIGNS: ED Triage Vitals  Enc Vitals Group     BP 04/07/16 0443 (!) 166/95     Pulse Rate 04/07/16 0443 68     Resp 04/07/16 0443 18     Temp 04/07/16 0443 98.7 F (37.1 C)     Temp Source 04/07/16 0443 Oral     SpO2 04/07/16 0443 97 %     Weight 04/07/16 0444 276 lb (125.2 kg)     Height 04/07/16 0444 6\' 4"  (1.93 m)     Head Circumference --      Peak Flow --      Pain Score 04/07/16 0444 10     Pain Loc --      Pain Edu? --      Excl. in GC? --     Constitutional: Alert and oriented. Well appearing and in no acute distress. Eyes: Conjunctivae are normal. PERRL. EOMI. Head: Atraumatic. Nose: No congestion/rhinnorhea. Mouth/Throat: Mucous membranes are moist.  Oropharynx non-erythematous. Cardiovascular: Normal rate, regular rhythm. Grossly normal heart sounds.  Good peripheral circulation. Respiratory: Normal respiratory effort.  No retractions. Lungs CTAB. Gastrointestinal: Soft and nontender. No distention. Positive bowel sounds Musculoskeletal: No lower extremity tenderness nor edema.  Minimal tenderness to palpation of back. Neurologic:  Normal speech and language.  Skin:  Skin is warm, dry and intact.  Psychiatric: Mood and affect are normal.   ____________________________________________   LABS (all labs ordered are listed, but only abnormal results are displayed)  Labs Reviewed  ACETAMINOPHEN LEVEL - Abnormal; Notable for the following:       Result Value   Acetaminophen (Tylenol), Serum <10 (*)    All other components within normal limits  CBC - Abnormal; Notable for the following:    HCT 39.2 (*)    MCV 78.3 (*)    RDW 14.7 (*)    Platelets 130 (*)    All other components  within normal limits  BASIC METABOLIC PANEL - Abnormal; Notable for the following:    Glucose, Bld 162 (*)    Creatinine, Ser 0.59 (*)    Calcium 8.8 (*)    All other components within normal limits  SALICYLATE LEVEL   ____________________________________________  EKG  none ____________________________________________  RADIOLOGY  none ____________________________________________   PROCEDURES  Procedure(s) performed: None  Procedures  Critical Care performed: No  ____________________________________________   INITIAL IMPRESSION / ASSESSMENT AND PLAN / ED COURSE  Pertinent labs & imaging results that were available during my care of the patient were reviewed by me and considered in my medical decision making (see chart for details).  This is a 45 year old male who comes into the hospital today with back pain. The patient took 6-8 hydrocodone he reports in an effort to get rid of his back  pain but he reports it didn't help. The patient does have a history of chronic pain and scoliosis. I did give the patient dose of Toradol and a Lidoderm patch for his pain.  Clinical Course   The patient is sitting on the bed and he is not complaining at this time. I informed the patient that with his chronic pain he needs to have someone that he can see for the possible referral if he needs surgery. The patient also reports he's not on any nerve medication. I feel that the patient needs to follow back up with his doctor as well as see some specialist to determine if he needs any further intervention or surgery on his back. I will also repeat the patient's acetaminophen level to ensure that it is not rising. I will talk to our case manager and have her discuss with the patient the options.  The patient's care will be signed out to Dr Scotty Court who will follow up the results of the repeat acetaminophen level and disposition the patient.  ____________________________________________   FINAL  CLINICAL IMPRESSION(S) / ED DIAGNOSES  Final diagnoses:  Bilateral back pain, unspecified location  Musculoskeletal pain      NEW MEDICATIONS STARTED DURING THIS VISIT:  New Prescriptions   No medications on file     Note:  This document was prepared using Dragon voice recognition software and may include unintentional dictation errors.    Rebecka Apley, MD 04/07/16 [phone]

## 2016-04-07 NOTE — ED Triage Notes (Signed)
Pt arrived to the ED for complaints of back pain secondary to scoliosis and cerebral palsy. Pt states that his pain medication is not helping; last night he took more medication than he was supposed without relieve. Pt is AOx4 in no apparent distress.

## 2016-04-07 NOTE — Care Management Note (Signed)
Case Management Note  Patient Details  Name: Laverda SorensonMichael D Armwood MRN: [id number] Date of Birth: 10/04/1970  Subjective/Objective:    Saw patient per referral.I was told the patient needs help with getting home meds. He has medicaid , but does not know how his medicaions are paid for.  Patient is telling a narrative about driving on his restricted license. Aske him again if he needs help to get meds. He declines help. States he goes to Dr Sampson GoonFitzgerald as a PCP. No needs identified at this time.                Action/Plan:   Expected Discharge Date:                  Expected Discharge Plan:     In-House Referral:     Discharge planning Services     Post Acute Care Choice:    Choice offered to:     DME Arranged:    DME Agency:     HH Arranged:    HH Agency:     Status of Service:     If discussed at [employer] Length of Stay Meetings, dates discussed:    Additional Comments:  Berna BueCheryl Janal Haak, RN 04/07/2016, 9:13 AM

## 2016-04-07 NOTE — ED Provider Notes (Signed)
Assumed care from Dr. Zenda AlpersWebster. Repeat Tylenol level negative. Vital signs unremarkable. Case management talk with patient about his options with Medicare and home health assistance and following up with primary care doctor. For the patient's complaint of lifelong chronic pain, we'll have him try a short course of NSAIDs and Lidoderm patch. Since he just took an overdose of his opioids I will not refill these. No significant toxicity today but at risk. Denies SI HI or hallucinations. States he took the medications out of frustration and does not appear to be a danger to himself.   Derrick CheekPhillip Brylee Mcgreal, MD 04/07/16 1133

## 2017-12-09 DIAGNOSIS — Z8739 Personal history of other diseases of the musculoskeletal system and connective tissue: Secondary | ICD-10-CM | POA: Insufficient documentation

## 2017-12-09 DIAGNOSIS — G8929 Other chronic pain: Secondary | ICD-10-CM | POA: Insufficient documentation

## 2017-12-09 DIAGNOSIS — M545 Low back pain, unspecified: Secondary | ICD-10-CM | POA: Insufficient documentation

## 2018-06-23 ENCOUNTER — Emergency Department: Payer: Medicare Other

## 2018-06-23 ENCOUNTER — Other Ambulatory Visit: Payer: Self-pay

## 2018-06-23 ENCOUNTER — Emergency Department
Admission: EM | Admit: 2018-06-23 | Discharge: 2018-06-23 | Disposition: A | Discharge status: Home / Self Care | Payer: Medicare Other | Attending: Emergency Medicine | Admitting: Emergency Medicine

## 2018-06-23 DIAGNOSIS — Z91040 Latex allergy status: Secondary | ICD-10-CM | POA: Diagnosis not present

## 2018-06-23 DIAGNOSIS — I10 Essential (primary) hypertension: Secondary | ICD-10-CM | POA: Insufficient documentation

## 2018-06-23 DIAGNOSIS — I872 Venous insufficiency (chronic) (peripheral): Secondary | ICD-10-CM | POA: Diagnosis not present

## 2018-06-23 DIAGNOSIS — M25476 Effusion, unspecified foot: Secondary | ICD-10-CM

## 2018-06-23 DIAGNOSIS — Z79899 Other long term (current) drug therapy: Secondary | ICD-10-CM | POA: Insufficient documentation

## 2018-06-23 DIAGNOSIS — Z794 Long term (current) use of insulin: Secondary | ICD-10-CM | POA: Diagnosis not present

## 2018-06-23 DIAGNOSIS — I878 Other specified disorders of veins: Secondary | ICD-10-CM

## 2018-06-23 DIAGNOSIS — R2243 Localized swelling, mass and lump, lower limb, bilateral: Secondary | ICD-10-CM | POA: Insufficient documentation

## 2018-06-23 DIAGNOSIS — E119 Type 2 diabetes mellitus without complications: Secondary | ICD-10-CM | POA: Insufficient documentation

## 2018-06-23 DIAGNOSIS — M25475 Effusion, left foot: Secondary | ICD-10-CM

## 2018-06-23 DIAGNOSIS — M25473 Effusion, unspecified ankle: Secondary | ICD-10-CM

## 2018-06-23 DIAGNOSIS — G809 Cerebral palsy, unspecified: Secondary | ICD-10-CM | POA: Insufficient documentation

## 2018-06-23 MED ORDER — SIMETHICONE 80 MG PO CHEW
80.0000 mg | CHEWABLE_TABLET | Freq: Once | ORAL | Status: AC
  Administered 2018-06-23: 80 mg via ORAL
  Filled 2018-06-23 (×2): qty 1

## 2018-06-23 NOTE — Discharge Instructions (Signed)
Please keep the lower extremities elevated and wear compression stockings to help with swelling in feet and ankles.  Continue to monitor sodium content and limit your intake of sodium.  Return to the ER for any worsening symptoms or urgent changes in health.  Call PCP to schedule follow-up appointment.

## 2018-06-23 NOTE — ED Provider Notes (Signed)
[facility] EMERGENCY DEPARTMENT Provider Note   CSN: [id number] Arrival date & time: 06/23/18  1603     History   Chief Complaint Chief Complaint  Patient presents with  . Foot Swelling    HPI Derrick Atkins is a 47 y.o. male.  Presents to the emergency department evaluation of left foot and leg swelling.  Patient has a history of cerebral palsy, diabetes mellitus, hypertension.  [facility].  Patient states 3 days ago he is noticed increasing swelling throughout the left foot and leg.  He has some mild swelling in both lower extremities at baseline.  He denies any pain or discomfort.  No chest pain, shortness of breath or nocturnal dyspnea.  No history of blood clots.  He denies any trauma or injury to the left foot.  He is ambulatory with no assistive devices.  He denies any fevers or chills.  Recent CMP CBC reviewed from [facility] showing normal liver enzymes, renal function.  HPI  Past Medical History:  Diagnosis Date  . Cerebral palsy (HCC)   . Depression   . Diabetes mellitus without complication (HCC)   . Hypertension   . Kidney stones   . Schizoaffective disorder (HCC)   . Scoliosis     Patient Active Problem List   Diagnosis Date Noted  . Schizophrenia, paranoid, chronic (HCC)   . DIABETES MELLITUS 05/03/2007  . SCHIZOPHRENIA 05/03/2007  . HYPERTENSION 05/03/2007  . PNEUMONITIS DUE TO FOOD/VOMIT INHALATION 05/03/2007  . 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 BOWEL RESECTION          Home Medications    Prior to Admission medications   Medication Sig Start Date End Date Taking? Authorizing Provider  ARIPiprazole (ABILIFY) 5 MG tablet Take 1 tablet by mouth daily. 07/03/15   [provider]  canagliflozin (INVOKANA) 100 MG TABS tablet Take 1 mg by mouth daily.    [provider]  cloZAPine (CLOZARIL) 25 MG tablet Take 1 tablet by  mouth 2 (two) times daily. 07/03/15   [provider]  divalproex (DEPAKOTE) 500 MG DR tablet Take 2 tablets by mouth 2 (two) times daily.  07/03/15   [provider]  levothyroxine (SYNTHROID, LEVOTHROID) 88 MCG tablet Take 1 tablet by mouth daily. 07/03/15   [provider]  lidocaine (LIDODERM) 5 % Place 1 patch onto the skin every 12 (twelve) hours. Remove & Discard patch within 12 hours or as directed by MD 04/07/16   Sharman CheekStafford, Phillip, MD  lisinopril (PRINIVIL,ZESTRIL) 10 MG tablet Take 1 tablet by mouth daily. 07/03/15   [provider]  metFORMIN (GLUCOPHAGE) 1000 MG tablet Take 1 tablet by mouth 2 (two) times daily with a meal.  07/03/15   [provider]  metoprolol succinate (TOPROL-XL) 50 MG 24 hr tablet Take 1 tablet by mouth daily. 07/03/15   [provider]  naproxen (NAPROSYN) 500 MG tablet Take 1 tablet (500 mg total) by mouth 2 (two) times daily with a meal. 04/07/16   Sharman CheekStafford, Phillip, MD  NOVOLOG MIX 70/30 FLEXPEN (70-30) 100 UNIT/ML FlexPen Inject 32-50 Units into the skin 2 (two) times daily. 50 units before breakfast and  32 units before supper 07/01/15   [provider]  pioglitazone (ACTOS) 30 MG tablet Take 1 tablet by mouth daily. 07/03/15   [provider]  simvastatin (ZOCOR) 20 MG tablet Take 1 tablet by mouth daily. 07/03/15  [provider]    Family History No family history on file.  Social History Social History   Tobacco Use  . Smoking status: Never Smoker  . Smokeless tobacco: Never Used  Substance Use Topics  . Alcohol use: No  . Drug use: No     Allergies   Latex   Review of Systems Review of Systems  Constitutional: Negative for chills and fever.  Respiratory: Negative for cough, choking, chest tightness and shortness of breath.   Cardiovascular: Positive for leg swelling. Negative for chest pain and palpitations.  Gastrointestinal: Negative for nausea and  vomiting.  Musculoskeletal: Negative for gait problem.  Skin: Positive for rash.  Neurological: Negative for numbness.     Physical Exam Updated Vital Signs BP 138/74 (BP Location: Left Arm)   Pulse 83   Temp 98.1 F (36.7 C) (Oral)   Resp 16   Ht 6\' 4"  (1.93 m)   Wt 130.6 kg   SpO2 97%   BMI 35.06 kg/m   Physical Exam Vitals signs and nursing note reviewed.  Constitutional:      General: He is not in acute distress.    Appearance: Normal appearance. He is well-developed.     Comments: Ambulatory with no assist devices.  HENT:     Head: Normocephalic and atraumatic.  Eyes:     Conjunctiva/sclera: Conjunctivae normal.  Neck:     Musculoskeletal: Normal range of motion.  Cardiovascular:     Rate and Rhythm: Normal rate.  Pulmonary:     Effort: Pulmonary effort is normal. No respiratory distress.  Musculoskeletal: Normal range of motion.     Comments: Examination of bilateral lower extremity shows patient has good range of motion of the hips knees and ankles.  Very mild edema in both ankles and feet with skin changes consistent with chronic venous stasis.  2+ dorsalis pedis pulses are palpable bilaterally.  There is slight increase in edema in the left ankle and foot when compared to the right.  Slight swelling of the left calf and anterior shin that is not present on the right lower leg.  There is no warmth or redness or tenderness palpation in both calf regions.  Sensation is intact distally.  No open wounds noted.  Skin:    General: Skin is warm.     Findings: No rash.  Neurological:     Mental Status: He is alert and oriented to person, place, and time.  Psychiatric:        Behavior: Behavior normal.        Thought Content: Thought content normal.      ED Treatments / Results  Labs (all labs ordered are listed, but only abnormal results are displayed) Labs Reviewed - No data to display  EKG None  Radiology US Venous Img Lower Unilateral Left  Result Date:  06/23/2018 CLINICAL DATA:  Left foot swelling with pain. EXAM: LEFT LOWER EXTREMITY VENOUS DOPPLER ULTRASOUND TECHNIQUE: Gray-scale sonography with graded compression, as well as color Doppler and duplex ultrasound were performed to evaluate the lower extremity deep venous systems from the level of the common femoral vein and including the common femoral, femoral, profunda femoral, popliteal and calf veins including the posterior tibial, peroneal and gastrocnemius veins when visible. The superficial great saphenous vein was also interrogated. Spectral Doppler was utilized to evaluate flow at rest and with distal augmentation maneuvers in the common femoral, femoral and popliteal veins. COMPARISON:  Foot radiographs same date. FINDINGS: Contralateral Common Femoral Vein: Respiratory phasicity  is normal and symmetric with the symptomatic side. No evidence of thrombus. Normal compressibility. Common Femoral Vein: No evidence of thrombus. Normal compressibility, respiratory phasicity and response to augmentation. Saphenofemoral Junction: No evidence of thrombus. Normal compressibility and flow on color Doppler imaging. Profunda Femoral Vein: No evidence of thrombus. Normal compressibility and flow on color Doppler imaging. Femoral Vein: No evidence of thrombus. Normal compressibility, respiratory phasicity and response to augmentation. Popliteal Vein: No evidence of thrombus. Normal compressibility, respiratory phasicity and response to augmentation. Calf Veins: No evidence of thrombus. Normal compressibility and flow on color Doppler imaging. Superficial Great Saphenous Vein: No evidence of thrombus. Normal compressibility. Other Findings:  None. IMPRESSION: No evidence of acute left lower extremity deep venous thrombosis. Electronically Signed   By: Carey Bullocks M.D.   On: 06/23/2018 19:17   Dg Foot Complete Left  Result Date: 06/23/2018 CLINICAL DATA:  Left foot swelling EXAM: LEFT FOOT - COMPLETE 3+ VIEW  COMPARISON:  None. FINDINGS: No fracture or malalignment. No soft tissue emphysema or radiopaque foreign body. IMPRESSION: No acute osseous abnormality. Electronically Signed   By: Jasmine Pang M.D.   On: 06/23/2018 17:42    Procedures Procedures (including critical care time)  Medications Ordered in ED Medications  simethicone (MYLICON) chewable tablet 80 mg (80 mg Oral Given 06/23/18 1929)     Initial Impression / Assessment and Plan / ED Course  I have reviewed the triage vital signs and the nursing notes.  Pertinent labs & imaging results that were available during my care of the patient were reviewed by me and considered in my medical decision making (see chart for details).     47 year old male with swelling in the feet and ankles slightly more swelling in the left lower leg.  He has some venous stasis changes to both feet and ankles.  X-rays show no evidence of acute bony abnormality or foreign body.  Ultrasound of the lower extremity shows no DVT.  He is given compression stockings.  He will elevate the lower extremities.  He will decrease sodium in his diet.  He will follow-up PCP if no improvement.  He understands signs symptoms return to ED for.  Final Clinical Impressions(s) / ED Diagnoses   Final diagnoses:  Bilateral swelling of feet and ankles  Venous stasis    ED Discharge Orders    None       Ronnette Juniper 06/23/18 2004    Myrna Blazer, MD 06/23/18 2240

## 2018-06-23 NOTE — ED Notes (Signed)
See triage note  Presents with some swelling to left foot  States he noticed swelling about 3 days ago  Denies any pain

## 2018-06-23 NOTE — ED Notes (Signed)
3XL ted hose knee high applied. Pt verbalizes understanding of application.

## 2018-06-23 NOTE — ED Notes (Signed)
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

## 2018-06-23 NOTE — ED Triage Notes (Signed)
Pt states that he noticed swelling to his left foot a couple days ago and today the swelling was worse, pt's pedal pulse auscultated with the vascular doppler in triage, swelling to left foot at +1-2

## 2020-05-13 ENCOUNTER — Emergency Department: Payer: Medicare Other

## 2020-05-13 ENCOUNTER — Inpatient Hospital Stay
Admission: EM | Admit: 2020-05-13 | Discharge: 2020-05-23 | DRG: 336 | Disposition: A | Discharge status: Home Health | Payer: Medicare Other | Attending: Surgery | Admitting: Surgery

## 2020-05-13 ENCOUNTER — Encounter: Payer: Self-pay | Admitting: Radiology

## 2020-05-13 ENCOUNTER — Other Ambulatory Visit: Payer: Self-pay

## 2020-05-13 ENCOUNTER — Inpatient Hospital Stay: Payer: Medicare Other

## 2020-05-13 DIAGNOSIS — G809 Cerebral palsy, unspecified: Secondary | ICD-10-CM | POA: Diagnosis present

## 2020-05-13 DIAGNOSIS — K5652 Intestinal adhesions [bands] with complete obstruction: Principal | ICD-10-CM | POA: Diagnosis present

## 2020-05-13 DIAGNOSIS — Z87442 Personal history of urinary calculi: Secondary | ICD-10-CM

## 2020-05-13 DIAGNOSIS — E876 Hypokalemia: Secondary | ICD-10-CM | POA: Diagnosis present

## 2020-05-13 DIAGNOSIS — M419 Scoliosis, unspecified: Secondary | ICD-10-CM | POA: Diagnosis present

## 2020-05-13 DIAGNOSIS — I10 Essential (primary) hypertension: Secondary | ICD-10-CM | POA: Diagnosis present

## 2020-05-13 DIAGNOSIS — E119 Type 2 diabetes mellitus without complications: Secondary | ICD-10-CM | POA: Diagnosis present

## 2020-05-13 DIAGNOSIS — K56609 Unspecified intestinal obstruction, unspecified as to partial versus complete obstruction: Secondary | ICD-10-CM | POA: Diagnosis present

## 2020-05-13 DIAGNOSIS — Z23 Encounter for immunization: Secondary | ICD-10-CM

## 2020-05-13 DIAGNOSIS — K567 Ileus, unspecified: Secondary | ICD-10-CM | POA: Diagnosis not present

## 2020-05-13 DIAGNOSIS — E669 Obesity, unspecified: Secondary | ICD-10-CM | POA: Diagnosis present

## 2020-05-13 DIAGNOSIS — Z794 Long term (current) use of insulin: Secondary | ICD-10-CM

## 2020-05-13 DIAGNOSIS — Z9049 Acquired absence of other specified parts of digestive tract: Secondary | ICD-10-CM

## 2020-05-13 DIAGNOSIS — Z6831 Body mass index (BMI) 31.0-31.9, adult: Secondary | ICD-10-CM | POA: Diagnosis not present

## 2020-05-13 DIAGNOSIS — R188 Other ascites: Secondary | ICD-10-CM | POA: Diagnosis present

## 2020-05-13 DIAGNOSIS — F32A Depression, unspecified: Secondary | ICD-10-CM | POA: Diagnosis present

## 2020-05-13 DIAGNOSIS — F259 Schizoaffective disorder, unspecified: Secondary | ICD-10-CM | POA: Diagnosis present

## 2020-05-13 DIAGNOSIS — E871 Hypo-osmolality and hyponatremia: Secondary | ICD-10-CM | POA: Diagnosis present

## 2020-05-13 DIAGNOSIS — Z20822 Contact with and (suspected) exposure to covid-19: Secondary | ICD-10-CM | POA: Diagnosis present

## 2020-05-13 DIAGNOSIS — K660 Peritoneal adhesions (postprocedural) (postinfection): Secondary | ICD-10-CM | POA: Diagnosis not present

## 2020-05-13 DIAGNOSIS — Z91040 Latex allergy status: Secondary | ICD-10-CM

## 2020-05-13 DIAGNOSIS — R040 Epistaxis: Secondary | ICD-10-CM | POA: Diagnosis not present

## 2020-05-13 DIAGNOSIS — D72819 Decreased white blood cell count, unspecified: Secondary | ICD-10-CM | POA: Diagnosis present

## 2020-05-13 DIAGNOSIS — R1084 Generalized abdominal pain: Secondary | ICD-10-CM

## 2020-05-13 DIAGNOSIS — Z888 Allergy status to other drugs, medicaments and biological substances status: Secondary | ICD-10-CM

## 2020-05-13 LAB — CBC
HCT: 43.6 % (ref 39.0–52.0)
Hemoglobin: 14.8 g/dL (ref 13.0–17.0)
MCH: 29 pg (ref 26.0–34.0)
MCHC: 33.9 g/dL (ref 30.0–36.0)
MCV: 85.3 fL (ref 80.0–100.0)
Platelets: 216 10*3/uL (ref 150–400)
RBC: 5.11 MIL/uL (ref 4.22–5.81)
RDW: 14.5 % (ref 11.5–15.5)
WBC: 8.4 10*3/uL (ref 4.0–10.5)
nRBC: 0 % (ref 0.0–0.2)

## 2020-05-13 LAB — COMPREHENSIVE METABOLIC PANEL
ALT: 16 U/L (ref 0–44)
AST: 24 U/L (ref 15–41)
Albumin: 3.9 g/dL (ref 3.5–5.0)
Alkaline Phosphatase: 60 U/L (ref 38–126)
Anion gap: 15 (ref 5–15)
BUN: 30 mg/dL — ABNORMAL HIGH (ref 6–20)
CO2: 19 mmol/L — ABNORMAL LOW (ref 22–32)
Calcium: 8.8 mg/dL — ABNORMAL LOW (ref 8.9–10.3)
Chloride: 100 mmol/L (ref 98–111)
Creatinine, Ser: 1.09 mg/dL (ref 0.61–1.24)
GFR, Estimated: >60 mL/min (ref >60)
Glucose, Bld: 170 mg/dL — ABNORMAL HIGH (ref 70–99)
Potassium: 4.5 mmol/L (ref 3.5–5.1)
Sodium: 134 mmol/L — ABNORMAL LOW (ref 135–145)
Total Bilirubin: 1.1 mg/dL (ref 0.3–1.2)
Total Protein: 6.8 g/dL (ref 6.5–8.1)

## 2020-05-13 LAB — URINALYSIS, COMPLETE (UACMP) WITH MICROSCOPIC
Bacteria, UA: NONE SEEN
Bilirubin Urine: NEGATIVE
Glucose, UA: NEGATIVE mg/dL
Hgb urine dipstick: NEGATIVE
Ketones, ur: 5 mg/dL — AB
Leukocytes,Ua: NEGATIVE
Nitrite: NEGATIVE
Protein, ur: 30 mg/dL — AB
Specific Gravity, Urine: 1.03 (ref 1.005–1.030)
Squamous Epithelial / LPF: NONE SEEN (ref 0–5)
pH: 5 (ref 5.0–8.0)

## 2020-05-13 LAB — HEMOGLOBIN A1C
Hgb A1c MFr Bld: 5.7 % — ABNORMAL HIGH (ref 4.8–5.6)
Mean Plasma Glucose: 116.89 mg/dL

## 2020-05-13 LAB — RESPIRATORY PANEL BY RT PCR (FLU A&B, COVID)
Influenza A by PCR: NEGATIVE
Influenza B by PCR: NEGATIVE
SARS Coronavirus 2 by RT PCR: NEGATIVE

## 2020-05-13 LAB — CBG MONITORING, ED
Glucose-Capillary: 104 mg/dL — ABNORMAL HIGH (ref 70–99)
Glucose-Capillary: 103 mg/dL — ABNORMAL HIGH (ref 70–99)

## 2020-05-13 LAB — TROPONIN I (HIGH SENSITIVITY): Troponin I (High Sensitivity): 4 ng/L (ref <18)

## 2020-05-13 LAB — LIPASE, BLOOD: Lipase: 35 U/L (ref 11–51)

## 2020-05-13 LAB — GLUCOSE, CAPILLARY: Glucose-Capillary: 70 mg/dL (ref 70–99)

## 2020-05-13 MED ORDER — ENOXAPARIN SODIUM 40 MG/0.4ML ~~LOC~~ SOLN
40.0000 mg | SUBCUTANEOUS | Status: DC
  Administered 2020-05-13: 40 mg via SUBCUTANEOUS
  Filled 2020-05-13: qty 0.4

## 2020-05-13 MED ORDER — LACTATED RINGERS IV SOLN: INTRAVENOUS | Status: DC

## 2020-05-13 MED ORDER — PANTOPRAZOLE SODIUM 40 MG IV SOLR
40.0000 mg | INTRAVENOUS | Status: DC
  Administered 2020-05-13 – 2020-05-21 (×9): 40 mg via INTRAVENOUS
  Filled 2020-05-13 (×9): qty 40

## 2020-05-13 MED ORDER — BUPROPION HCL ER (XL) 150 MG PO TB24
300.0000 mg | ORAL_TABLET | Freq: Every day | ORAL | Status: DC
  Administered 2020-05-13 – 2020-05-23 (×10): 300 mg via ORAL
  Filled 2020-05-13 (×11): qty 2

## 2020-05-13 MED ORDER — MORPHINE SULFATE (PF) 2 MG/ML IV SOLN
2.0000 mg | INTRAVENOUS | Status: DC | PRN
  Administered 2020-05-13 – 2020-05-14 (×3): 2 mg via INTRAVENOUS
  Administered 2020-05-14 – 2020-05-15 (×2): 4 mg via INTRAVENOUS
  Filled 2020-05-13: qty 1
  Filled 2020-05-13 (×2): qty 2
  Filled 2020-05-13 (×2): qty 1

## 2020-05-13 MED ORDER — DIVALPROEX SODIUM 500 MG PO DR TAB
1000.0000 mg | DELAYED_RELEASE_TABLET | Freq: Two times a day (BID) | ORAL | Status: DC
  Administered 2020-05-13 – 2020-05-23 (×19): 1000 mg via ORAL
  Filled 2020-05-13 (×21): qty 2

## 2020-05-13 MED ORDER — KETOROLAC TROMETHAMINE 30 MG/ML IJ SOLN
30.0000 mg | Freq: Four times a day (QID) | INTRAMUSCULAR | Status: DC
  Administered 2020-05-13: 30 mg via INTRAVENOUS
  Filled 2020-05-13: qty 1

## 2020-05-13 MED ORDER — ONDANSETRON 4 MG PO TBDP
4.0000 mg | ORAL_TABLET | Freq: Four times a day (QID) | ORAL | Status: DC | PRN
  Filled 2020-05-13: qty 1

## 2020-05-13 MED ORDER — DEXTROSE IN LACTATED RINGERS 5 % IV SOLN: INTRAVENOUS | Status: AC

## 2020-05-13 MED ORDER — IOHEXOL 300 MG/ML  SOLN
100.0000 mL | Freq: Once | INTRAMUSCULAR | Status: AC | PRN
  Administered 2020-05-13: 100 mL via INTRAVENOUS

## 2020-05-13 MED ORDER — MORPHINE SULFATE (PF) 4 MG/ML IV SOLN
4.0000 mg | Freq: Once | INTRAVENOUS | Status: AC
  Administered 2020-05-13: 4 mg via INTRAVENOUS
  Filled 2020-05-13: qty 1

## 2020-05-13 MED ORDER — MORPHINE SULFATE (PF) 4 MG/ML IV SOLN
6.0000 mg | Freq: Once | INTRAVENOUS | Status: AC
  Administered 2020-05-13: 6 mg via INTRAVENOUS
  Filled 2020-05-13: qty 2

## 2020-05-13 MED ORDER — KETOROLAC TROMETHAMINE 30 MG/ML IJ SOLN
30.0000 mg | Freq: Four times a day (QID) | INTRAMUSCULAR | Status: DC | PRN
  Administered 2020-05-13 – 2020-05-15 (×4): 30 mg via INTRAVENOUS
  Filled 2020-05-13 (×4): qty 1

## 2020-05-13 MED ORDER — LACTATED RINGERS IV BOLUS
1000.0000 mL | Freq: Once | INTRAVENOUS | Status: AC
  Administered 2020-05-13: 1000 mL via INTRAVENOUS

## 2020-05-13 MED ORDER — IOHEXOL 9 MG/ML PO SOLN
500.0000 mL | Freq: Once | ORAL | Status: AC
  Administered 2020-05-13: 500 mL via ORAL

## 2020-05-13 MED ORDER — LEVOTHYROXINE SODIUM 88 MCG PO TABS
88.0000 ug | ORAL_TABLET | Freq: Every day | ORAL | Status: DC
  Administered 2020-05-13 – 2020-05-23 (×9): 88 ug via ORAL
  Filled 2020-05-13 (×11): qty 1

## 2020-05-13 MED ORDER — ONDANSETRON HCL 4 MG/2ML IJ SOLN
4.0000 mg | Freq: Once | INTRAMUSCULAR | Status: AC
  Administered 2020-05-13: 4 mg via INTRAVENOUS
  Filled 2020-05-13: qty 2

## 2020-05-13 MED ORDER — INSULIN ASPART 100 UNIT/ML ~~LOC~~ SOLN
0.0000 [IU] | SUBCUTANEOUS | Status: DC
  Administered 2020-05-14 – 2020-05-17 (×11): 3 [IU] via SUBCUTANEOUS
  Administered 2020-05-17: 18:00:00 4 [IU] via SUBCUTANEOUS
  Administered 2020-05-17: 3 [IU] via SUBCUTANEOUS
  Administered 2020-05-17: 13:00:00 4 [IU] via SUBCUTANEOUS
  Administered 2020-05-17: 22:00:00 3 [IU] via SUBCUTANEOUS
  Administered 2020-05-18 (×4): 4 [IU] via SUBCUTANEOUS
  Administered 2020-05-18: 7 [IU] via SUBCUTANEOUS
  Administered 2020-05-19 (×2): 4 [IU] via SUBCUTANEOUS
  Administered 2020-05-19: 09:00:00 6 [IU] via SUBCUTANEOUS
  Administered 2020-05-19 (×2): 4 [IU] via SUBCUTANEOUS
  Administered 2020-05-19: 7 [IU] via SUBCUTANEOUS
  Administered 2020-05-20 (×4): 4 [IU] via SUBCUTANEOUS
  Filled 2020-05-13 (×29): qty 1

## 2020-05-13 MED ORDER — ONDANSETRON HCL 4 MG/2ML IJ SOLN
4.0000 mg | Freq: Four times a day (QID) | INTRAMUSCULAR | Status: DC | PRN
  Administered 2020-05-13 – 2020-05-22 (×5): 4 mg via INTRAVENOUS
  Filled 2020-05-13 (×5): qty 2

## 2020-05-13 MED ORDER — ARIPIPRAZOLE 10 MG PO TABS
20.0000 mg | ORAL_TABLET | Freq: Every day | ORAL | Status: DC
  Administered 2020-05-14 – 2020-05-23 (×10): 20 mg via ORAL
  Filled 2020-05-13 (×12): qty 2

## 2020-05-13 NOTE — ED Notes (Signed)
Pt back in room. Provided urinal. Pt states feels nauseated. Provided emesis bag.

## 2020-05-13 NOTE — H&P (Signed)
[facility] SURGICAL HISTORY & PHYSICAL (cpt [phone])  HISTORY OF PRESENT ILLNESS (HPI):  49 y.o. male presented to [facility] ED overnight for abdominal pain. Patient reports around a 4 day history of generalized abdominal soreness, distension, intermittent nausea, non-bloody emesis, decreased PO intake, and decline in bowel function. He reports these symptoms have been progressively worsening. Eating seems to exacerbate these symptoms and induces vomiting. No fever, chill, urinary changes. He does not endorse a history of similar in the past. 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 Unsure who preformed this. Work up in the ED was concerning for slight bump in sCr to 1.09 from baseline, mild hyponatremia to 134, and CT Abdomen/Pelvs was concerning for small bowel obstruction.   General Surgery is consulted by emergency medicine physician Dr Delton Prairie, MD for evaluation and management of SBO   PAST MEDICAL HISTORY (PMH):  Past Medical History:  Diagnosis Date  . Cerebral palsy (HCC)   . Depression   . Diabetes mellitus without complication (HCC)   . Hypertension   . Kidney stones   . Schizoaffective disorder (HCC)   . Scoliosis     Reviewed. Otherwise negative.   PAST SURGICAL HISTORY (PSH):  Past Surgical History:  Procedure Laterality Date  . BOWEL RESECTION      Reviewed. Otherwise negative.   MEDICATIONS:  Prior to Admission medications   Medication Sig Start Date End Date Taking? Authorizing Provider  ARIPiprazole (ABILIFY) 20 MG tablet Take 1 tablet by mouth daily.  07/03/15  Yes [provider]  aspirin 81 MG EC tablet Take 1 tablet by mouth daily.   Yes [provider]  buPROPion (WELLBUTRIN XL) 300 MG 24 hr tablet Take 300 mg by mouth daily. 05/06/20  Yes [provider]  divalproex (DEPAKOTE) 500 MG DR tablet Take 1,000 mg by mouth 2 (two) times daily.  07/03/15  Yes [provider]  gemfibrozil (LOPID) 600 MG tablet Take 600 mg by mouth 2 (two) times daily. 05/12/20  Yes [provider]  levothyroxine (SYNTHROID, LEVOTHROID) 88 MCG tablet Take 1 tablet by mouth daily. 07/03/15  Yes [provider]  lisinopril (PRINIVIL,ZESTRIL) 10 MG tablet Take 1 tablet by mouth daily. 07/03/15  Yes [provider]  metFORMIN (GLUCOPHAGE) 1000 MG tablet Take 1 tablet by mouth 2 (two) times daily with a meal.  07/03/15  Yes [provider]  NOVOLOG MIX 70/30 FLEXPEN (70-30) 100 UNIT/ML FlexPen Inject 34-44 Units into the skin 2 (two) times daily. 34 units-am 44units-pm 07/01/15  Yes [provider]  pioglitazone (ACTOS) 45 MG tablet Take 1 tablet by mouth daily.  07/03/15  Yes [provider]  lidocaine (LIDODERM) 5 % Place 1 patch onto the skin every 12 (twelve) hours. Remove & Discard patch within 12 hours or as directed by MD Patient not taking: Reported on 05/13/2020 04/07/16   Sharman Cheek, MD  naproxen (NAPROSYN) 500 MG tablet Take 1 tablet (500 mg total) by mouth 2 (two) times daily with a meal. Patient not taking: Reported on 05/13/2020 04/07/16   Sharman Cheek, MD     ALLERGIES:  Allergies  Allergen Reactions  . Latex Hives  . Prednisone      SOCIAL HISTORY:  Social History   Socioeconomic History  . Marital status: Married    Spouse name: Not on file  . Number of children: Not on file  . Years of education: Not on file  . Highest education level:  Not on file  Occupational History  . Not on file  Tobacco Use  . Smoking status: Never Smoker  . Smokeless tobacco: Never Used  Substance and Sexual Activity  . Alcohol use: No  . Drug use: No  . Sexual activity: Not on file  Other Topics Concern  . Not on file  Social History Narrative  . Not on file   Social Determinants of Health   Financial Resource Strain:   . Difficulty of Paying Living Expenses: Not on file  Food Insecurity:   .  Worried About Programme researcher, broadcasting/film/video in the Last Year: Not on file  . Ran Out of Food in the Last Year: Not on file  Transportation Needs:   . Lack of Transportation (Medical): Not on file  . Lack of Transportation (Non-Medical): Not on file  Physical Activity:   . Days of Exercise per Week: Not on file  . Minutes of Exercise per Session: Not on file  Stress:   . Feeling of Stress : Not on file  Social Connections:   . Frequency of Communication with Friends and Family: Not on file  . Frequency of Social Gatherings with Friends and Family: Not on file  . Attends Religious Services: Not on file  . Active Member of Clubs or Organizations: Not on file  . Attends Banker Meetings: Not on file  . Marital Status: Not on file  Intimate Partner Violence:   . Fear of Current or Ex-Partner: Not on file  . Emotionally Abused: Not on file  . Physically Abused: Not on file  . Sexually Abused: Not on file     FAMILY HISTORY:  No family history on file.  Otherwise negative.   REVIEW OF SYSTEMS:  Review of Systems  Constitutional: Negative for chills and fever.       + Decreased PO intake  HENT: Negative for congestion and sore throat.   Respiratory: Negative for cough and shortness of breath.   Cardiovascular: Negative for chest pain and palpitations.  Gastrointestinal: Positive for abdominal pain, nausea and vomiting. Negative for constipation and diarrhea.       + Distension   Genitourinary: Negative for dysuria and urgency.  All other systems reviewed and are negative.   VITAL SIGNS:  Temp:  [97.7 F (36.5 C)-98.1 F (36.7 C)] 98.1 F (36.7 C) (11/02 0746) Pulse Rate:  [74-106] 92 (11/02 1100) Resp:  [18-30] 24 (11/02 1100) BP: (120-147)/(46-97) 137/55 (11/02 1100) SpO2:  [96 %-99 %] 98 % (11/02 1100) Weight:  [118.8 kg] 118.8 kg (11/02 0246)     Height: 6\' 4"  (193 cm) Weight: 118.8 kg BMI (Calculated): 31.9   PHYSICAL EXAM:  Physical Exam Vitals and nursing note  reviewed.  Constitutional:      General: He is not in acute distress.    Appearance: He is well-developed. He is obese. He is not ill-appearing.  HENT:     Head: Normocephalic and atraumatic.     Mouth/Throat:     Mouth: Mucous membranes are dry.     Pharynx: Oropharynx is clear.     Comments: Mucus membranes appear dry Eyes:     General: No scleral icterus.    Extraocular Movements: Extraocular movements intact.  Cardiovascular:     Rate and Rhythm: Normal rate and regular rhythm.     Heart sounds: Normal heart sounds. No murmur heard.   Pulmonary:     Effort: Pulmonary effort is normal. No respiratory distress.  Abdominal:  General: Abdomen is protuberant. There is distension.     Palpations: Abdomen is soft.     Tenderness: There is generalized abdominal tenderness. There is no guarding or rebound.     Comments: Abdomen is obese, markedly distended and tympanic, generalized soreness, no rebound/guarding. Certainly without peritonitis. He does have previous midline laparotomy incision.   Genitourinary:    Comments: Deferred Skin:    General: Skin is warm and dry.     Coloration: Skin is not jaundiced or pale.  Neurological:     General: No focal deficit present.     Mental Status: He is alert and oriented to person, place, and time.  Psychiatric:        Mood and Affect: Mood normal.        Behavior: Behavior normal.     INTAKE/OUTPUT:  This shift: No intake/output data recorded.  Last 2 shifts: @IOLAST2SHIFTS @  Labs:  CBC Latest Ref Rng & Units 05/13/2020 04/07/2016 07/04/2015  WBC 4.0 - 10.5 K/uL 8.4 5.7 5.0  Hemoglobin 13.0 - 17.0 g/dL 16.114.8 09.613.6 12.6(L)  Hematocrit 39 - 52 % 43.6 39.2(L) 38.0(L)  Platelets 150 - 400 K/uL 216 130(L) 155   CMP Latest Ref Rng & Units 05/13/2020 04/07/2016 07/04/2015  Glucose 70 - 99 mg/dL 045(W170(H) 098(J162(H) 191(Y276(H)  BUN 6 - 20 mg/dL 78(G30(H) 8 10  Creatinine 0.61 - 1.24 mg/dL 9.561.09 2.13(Y0.59(L) 8.650.71  Sodium 135 - 145 mmol/L 134(L) 139 137   Potassium 3.5 - 5.1 mmol/L 4.5 3.8 5.8(H)  Chloride 98 - 111 mmol/L 100 109 109  CO2 22 - 32 mmol/L 19(L) 24 22  Calcium 8.9 - 10.3 mg/dL 7.8(I8.8(L) 6.9(G8.8(L) 8.9  Total Protein 6.5 - 8.1 g/dL 6.8 - 7.1  Total Bilirubin 0.3 - 1.2 mg/dL 1.1 - 0.6  Alkaline Phos 38 - 126 U/L 60 - 71  AST 15 - 41 U/L 24 - 33  ALT 0 - 44 U/L 16 - 40    Imaging studies:   CT Abdomen/Pelvis (05/13/2020) personally reviewed showing small bowel dilation which appears to have some degree of transition at anastomosis, some ascites in the RUQ, s/p right and transverse colectomy with stapled ileocolonic anastomosis, and radiologist report reviewed below:  IMPRESSION: 1. High-Grade Small-bowel Obstruction with free fluid appears related to fecalization and impaction of bowel contents at a small to large bowel anastomosis in the left abdomen (series 2, image 66). 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 No free air to suggest perforation.  2. No other acute or inflammatory process identified. Bilateral lung base atelectasis or scarring.   Assessment/Plan: (ICD-10's: 38K56.609) 49 y.o. 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    - Admit to general surgery  - Recommend placement of NGT, low intermittent suction; monitor and record output  - NPO + IVF resuscitation  - Monitor abdominal examination; on-going bowel function  - Pain control prn; antiemetics prn  - Will get KUB in AM  - No emergent surgical intervention. He understands that should conservative measures fail then he may require exploration.    - Medical management of comorbidities; hold home medications for  now; added SSI  -  Mobilization encouraged as feasible  - DVT prophylaxis  All of the above findings and recommendations were discussed  with the patient, and all of his questions were answered to his expressed satisfaction.  -- Lynden Oxford, PA-C [facility] 05/13/2020, 11:19 AM [phone] M-F: 7am - 4pm

## 2020-05-13 NOTE — ED Notes (Signed)
Pt finished drinking oral contrast, CT notified.  

## 2020-05-13 NOTE — ED Notes (Signed)
LBM 10/30.

## 2020-05-13 NOTE — ED Notes (Signed)
Pt to CT

## 2020-05-13 NOTE — ED Triage Notes (Addendum)
Pt brought in by [facility] states ate raw peanut shells 3 days ago and since then has had n.v. and constipation. Has a hx of "twisted bowel" surgery. VS wnl per EMS, CBG 142. Pt states has not had BM in 3 days did take exlax for the same. Pt states to mid upper abd, vomited multiple times today.

## 2020-05-13 NOTE — ED Notes (Signed)
Report called to [id number] RN. Awaiting transport.

## 2020-05-13 NOTE — ED Notes (Signed)
Pt now stating LBM 4-5 d ago.

## 2020-05-13 NOTE — ED Notes (Signed)
Pt states abdo pain that started 3d ago on 10/30. LBM 3d ago. "Unable to keep anything down". No vomiting today but has been vomiting last 2d, unknown amount. Hx GI surgery "15-20 years ago" for strangulated bowel.

## 2020-05-13 NOTE — ED Notes (Signed)
Assumed care of this patient at this time.

## 2020-05-13 NOTE — ED Notes (Signed)
EDP at bedside. Pt now stating LBM "4-5d ago". Denies pain or burning with urination.

## 2020-05-13 NOTE — ED Provider Notes (Signed)
[facility] Emergency Department Provider Note ____________________________________________   First MD Initiated Contact with Patient 05/13/20 [phone]     (approximate)  I have reviewed the triage vital signs and the nursing notes.  HISTORY  Chief Complaint Abdominal Pain   HPI Derrick Atkins is a 49 y.o. malewho presents to the ED for evaluation of abdominal pain and emesis.  Chart review indicates history of schizoaffective disorder, cerebral palsy, diabetes, hypertension, hypothyroidism. Patient lives at home alone and handles his own ADLs.  Reports having family and friends nearby. He indicates remotely having having a surgery for "twisted bowels."  Patient reports about 4 days of progressively worsening and constant abdominal pain, nausea, vomiting and constipation.  Patient denies bowel movement or passing gas for multiple days.  Reports increasing epigastric abdominal pain and inability to tolerate p.o., "because everything goes down just comes right back up."  Denies urinary changes or dysuria.  Reports he has his medications at home, but has not been able to take them due to the symptoms.  Is currently reporting 8/10 intensity upper abdominal pain that is constant, aching in nature and with associated nausea.    Past Medical History:  Diagnosis Date  . Cerebral palsy (HCC)   . Depression   . Diabetes mellitus without complication (HCC)   . Hypertension   . Kidney stones   . Schizoaffective disorder (HCC)   . Scoliosis     Patient Active Problem List   Diagnosis Date Noted  . Schizophrenia, paranoid, chronic (HCC)   . DIABETES MELLITUS 05/03/2007  . SCHIZOPHRENIA 05/03/2007  . HYPERTENSION 05/03/2007  . PNEUMONITIS DUE TO FOOD/VOMIT INHALATION 05/03/2007  . 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 BOWEL RESECTION      Prior to Admission medications   Medication Sig Start Date End Date  Taking? Authorizing Provider  ARIPiprazole (ABILIFY) 20 MG tablet Take 1 tablet by mouth daily.  07/03/15  Yes [provider]  aspirin 81 MG EC tablet Take 1 tablet by mouth daily.   Yes [provider]  buPROPion (WELLBUTRIN XL) 300 MG 24 hr tablet Take 300 mg by mouth daily. 05/06/20  Yes [provider]  divalproex (DEPAKOTE) 500 MG DR tablet Take 1,000 mg by mouth 2 (two) times daily.  07/03/15  Yes [provider]  gemfibrozil (LOPID) 600 MG tablet Take 600 mg by mouth 2 (two) times daily. 05/12/20  Yes [provider]  levothyroxine (SYNTHROID, LEVOTHROID) 88 MCG tablet Take 1 tablet by mouth daily. 07/03/15  Yes [provider]  lisinopril (PRINIVIL,ZESTRIL) 10 MG tablet Take 1 tablet by mouth daily. 07/03/15  Yes [provider]  metFORMIN (GLUCOPHAGE) 1000 MG tablet Take 1 tablet by mouth 2 (two) times daily with a meal.  07/03/15  Yes [provider]  NOVOLOG MIX 70/30 FLEXPEN (70-30) 100 UNIT/ML FlexPen Inject 34-44 Units into the skin 2 (two) times daily. 34 units-am 44units-pm 07/01/15  Yes [provider]  pioglitazone (ACTOS) 45 MG tablet Take 1 tablet by mouth daily.  07/03/15  Yes [provider]  lidocaine (LIDODERM) 5 % Place 1 patch onto the skin every 12 (twelve) hours. Remove & Discard patch within 12 hours or as directed by MD Patient not taking: Reported on 05/13/2020 04/07/16   Sharman Cheek, MD  naproxen (NAPROSYN) 500 MG tablet Take 1 tablet (500 mg total) by mouth 2 (two) times daily with a meal. Patient not taking: Reported  on 05/13/2020 04/07/16   Sharman Cheek, MD    Allergies Latex and Prednisone  No family history on file.  Social History Social History   Tobacco Use  . Smoking status: Never Smoker  . Smokeless tobacco: Never Used  Substance Use Topics  . Alcohol use: No  . Drug use: No    Review of Systems  Constitutional: No fever/chills Eyes: No visual  changes. ENT: No sore throat. Cardiovascular: Denies chest pain. Respiratory: Denies shortness of breath. Gastrointestinal: Positive for abdominal pain, nausea, vomiting and constipation No diarrhea.   Genitourinary: Negative for dysuria. Musculoskeletal: Negative for back pain. Skin: Negative for rash. Neurological: Negative for headaches, focal weakness or numbness.  ____________________________________________   PHYSICAL EXAM:  VITAL SIGNS: Vitals:   05/13/20 1030 05/13/20 1100  BP: (!) 124/97 (!) 137/55  Pulse: 95 92  Resp: (!) 30 (!) 24  Temp:    SpO2: 98% 98%     Constitutional: Alert and oriented. Well appearing and in no acute distress.  Supine in bed, flat affect, conversational full sentences. Eyes: Conjunctivae are normal. PERRL. EOMI. Head: Atraumatic. Nose: No congestion/rhinnorhea. Mouth/Throat: Mucous membranes are dry.  Oropharynx non-erythematous. Neck: No stridor. No cervical spine tenderness to palpation. Cardiovascular: Normal rate, regular rhythm. Grossly normal heart sounds.  Good peripheral circulation. Respiratory: Normal respiratory effort.  No retractions. Lungs CTAB. Gastrointestinal: Soft , mildly distended.  Diffuse tenderness to palpation with epigastric involuntary guarding. No abdominal bruits. No CVA tenderness. Musculoskeletal: No lower extremity tenderness nor edema.  No joint effusions. No signs of acute trauma. Neurologic:  Normal speech and language. No gross focal neurologic deficits are appreciated. No gait instability noted. Skin:  Skin is warm, dry and intact. No rash noted. Psychiatric: Flat affect, but with linear thought processes.  Speech and behavior are normal.  ____________________________________________   LABS (all labs ordered are listed, but only abnormal results are displayed)  Labs Reviewed  COMPREHENSIVE METABOLIC PANEL - Abnormal; Notable for the following components:      Result Value   Sodium 134 (*)    CO2  19 (*)    Glucose, Bld 170 (*)    BUN 30 (*)    Calcium 8.8 (*)    All other components within normal limits  URINALYSIS, COMPLETE (UACMP) WITH MICROSCOPIC - Abnormal; Notable for the following components:   Color, Urine YELLOW (*)    APPearance HAZY (*)    Ketones, ur 5 (*)    Protein, ur 30 (*)    All other components within normal limits  RESPIRATORY PANEL BY RT PCR (FLU A&B, COVID)  CBC  LIPASE, BLOOD  TROPONIN I (HIGH SENSITIVITY)  TROPONIN I (HIGH SENSITIVITY)   ____________________________________________  12 Lead EKG  Sinus rhythm, rate of 97 bpm.  Normal axis.  Normal intervals.  No evidence of acute ischemia. ____________________________________________  RADIOLOGY  ED MD interpretation: CT abdomen/pelvis reviewed by me with evidence of small bowel obstruction.  Official radiology report(s): CT ABDOMEN PELVIS W CONTRAST  Result Date: 05/13/2020 CLINICAL DATA:  49 year old male with abdominal pain, distension, nausea vomiting for 2 days. History of prior bowel surgery. EXAM: CT ABDOMEN AND PELVIS WITH CONTRAST TECHNIQUE: Multidetector CT imaging of the abdomen and pelvis was performed using the standard protocol following bolus administration of intravenous contrast. CONTRAST:  OMNIPAQUE IOHEXOL 300 MG/ML  SOLN COMPARISON:  CT chest 09/23/2005. Report of CT Abdomen and Pelvis 09/28/2005 (no images available). FINDINGS: Lower chest: Chronically elevated right hemidiaphragm. No cardiomegaly. No pericardial or pleural  effusion. Streaky bilateral lower lobe opacity more resembles atelectasis and/or scarring, and is improved compared to 2007. Hepatobiliary: Simple density free fluid adjacent to the liver. Chronic elevation of the right hemidiaphragm. Negative liver. Gallbladder abutted by fluid but seems to remain normal. No dilated bile ducts. Pancreas: Negative. Spleen: Adjacent free fluid with simple fluid density. Otherwise negative. Incidental splenule. Adrenals/Urinary  Tract: Adrenal glands are within normal limits. Bilateral renal enhancement and contrast excretion is symmetric and within normal limits. No nephrolithiasis. Unremarkable ureters and urinary bladder. Stomach/Bowel: Surgically absent right colon and proximal transverse colon. There is a small to large bowel anastomosis in the mid left abdomen seen on series 2, image 66 and series 5, image 48. Downstream of that the large bowel is redundant but decompressed to the rectum. Abundant flocculated material, fecalization in the upstream anastomosis small bowel loop (coronal image 38). And diffusely dilated air and fluid containing upstream small bowel from there. Oral contrast has reached dilated small bowel in the right abdomen. Most small bowel loops are 40-47 mm diameter with associated mesenteric stranding and abundant free fluid in the small bowel mesentery. Simple fluid density. No free air. Superimposed distended proximal stomach with contrast. Decompressed distal stomach and relatively normal caliber duodenum. The duodenum does not cross midline. Proximal small bowel loops are in the right upper quadrant. Vascular/Lymphatic: Major arterial structures appear patent. Mild Aortoiliac calcified atherosclerosis. Mild proximal femoral artery calcified plaque. Portal venous system is patent. No lymphadenopathy. Reproductive: Negative. Other: Trace free fluid in the pelvis. Musculoskeletal: No acute osseous abnormality identified. Mild degeneration in the spine. IMPRESSION: 1. High-Grade Small-bowel Obstruction with free fluid appears related to fecalization and impaction of bowel contents at a small to large bowel anastomosis in the left abdomen (series 2, image 66). 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 No free air to suggest perforation. 2. No other acute or inflammatory process identified. Bilateral lung base atelectasis or scarring. Electronically Signed    By: Odessa FlemingH  Hall M.D.   On: 05/13/2020 10:00   ____________________________________________   PROCEDURES and INTERVENTIONS  Procedure(s) performed (including Critical Care):  .1-3 Lead EKG Interpretation Performed by: Delton PrairieSmith, Somer Trotter, MD Authorized by: Delton PrairieSmith, Orby Tangen, MD     Interpretation: normal     ECG rate:  94   ECG rate assessment: normal     Rhythm: sinus rhythm     Ectopy: none     Conduction: normal      Medications  lactated ringers bolus 1,000 mL (1,000 mLs Intravenous New Bag/Given 05/13/20 0811)  morphine 4 MG/ML injection 6 mg (6 mg Intravenous Given 05/13/20 0815)  ondansetron (ZOFRAN) injection 4 mg (4 mg Intravenous Given 05/13/20 0813)  iohexol (OMNIPAQUE) 9 MG/ML oral solution 500 mL (500 mLs Oral Contrast Given 05/13/20 0818)  iohexol (OMNIPAQUE) 300 MG/ML solution 100 mL (100 mLs Intravenous Contrast Given 05/13/20 0927)  morphine 4 MG/ML injection 4 mg (4 mg Intravenous Given 05/13/20 1059)    ____________________________________________   MDM / ED COURSE  [age] year old male with history of small bowel obstruction presents to the ED with few days of abdominal pain, distention and vomiting consistent with small bowel obstruction requiring surgical admission.  Tachycardic in triage, otherwise normal vital signs on room air.  Exam shows mild abdominal distention with diffuse tenderness with involuntary guarding concerning for peritonitis.  His blood work is largely unremarkable, but significant with a bicarb of 19.  CT imaging with oral and IV contrast demonstrates evidence of small bowel  obstruction at his previous anastomosis site.  Consulted surgery who evaluated the patient, recommending NGT and admission to their service.  Clinical Course as of May 13 1153  Tue May 13, 2020  5732 Patient returning to the room from CT.  Images reviewed by me concerning for SBO with air-fluid levels and dilated loops of bowel.  Will await radiology read.   [DS]  1021 Reassessed.  Patient  reports pain is well controlled.  Educated patient on CT results and plan for surgical evaluation.  He expresses understanding and agreement.   [DS]  1107 Callback from Dr. Aleen Campi, who has reviewed CT. He will have his PA come see the patient   [DS]    Clinical Course User Index [DS] Delton Prairie, MD     ____________________________________________   FINAL CLINICAL IMPRESSION(S) / ED DIAGNOSES  Final diagnoses:  Small bowel obstruction (HCC)  Generalized abdominal pain     ED Discharge Orders    None       Mearle Drew   Note:  This document was prepared using Dragon voice recognition software and may include unintentional dictation errors.   Delton Prairie, MD 05/13/20 1155

## 2020-05-14 ENCOUNTER — Inpatient Hospital Stay: Payer: Medicare Other

## 2020-05-14 ENCOUNTER — Encounter: Payer: Self-pay | Admitting: Surgery

## 2020-05-14 LAB — CBC
HCT: 39.9 % (ref 39.0–52.0)
Hemoglobin: 13.6 g/dL (ref 13.0–17.0)
MCH: 29.3 pg (ref 26.0–34.0)
MCHC: 34.1 g/dL (ref 30.0–36.0)
MCV: 86 fL (ref 80.0–100.0)
Platelets: 114 10*3/uL — ABNORMAL LOW (ref 150–400)
RBC: 4.64 MIL/uL (ref 4.22–5.81)
RDW: 14.3 % (ref 11.5–15.5)
WBC: 2.5 10*3/uL — ABNORMAL LOW (ref 4.0–10.5)
nRBC: 0 % (ref 0.0–0.2)

## 2020-05-14 LAB — BASIC METABOLIC PANEL
Anion gap: 13 (ref 5–15)
BUN: 41 mg/dL — ABNORMAL HIGH (ref 6–20)
CO2: 26 mmol/L (ref 22–32)
Calcium: 8.6 mg/dL — ABNORMAL LOW (ref 8.9–10.3)
Chloride: 97 mmol/L — ABNORMAL LOW (ref 98–111)
Creatinine, Ser: 1.13 mg/dL (ref 0.61–1.24)
GFR, Estimated: >60 mL/min (ref >60)
Glucose, Bld: 112 mg/dL — ABNORMAL HIGH (ref 70–99)
Potassium: 3.7 mmol/L (ref 3.5–5.1)
Sodium: 136 mmol/L (ref 135–145)

## 2020-05-14 LAB — GLUCOSE, CAPILLARY
Glucose-Capillary: 103 mg/dL — ABNORMAL HIGH (ref 70–99)
Glucose-Capillary: 107 mg/dL — ABNORMAL HIGH (ref 70–99)
Glucose-Capillary: 110 mg/dL — ABNORMAL HIGH (ref 70–99)
Glucose-Capillary: 134 mg/dL — ABNORMAL HIGH (ref 70–99)
Glucose-Capillary: 139 mg/dL — ABNORMAL HIGH (ref 70–99)
Glucose-Capillary: 80 mg/dL (ref 70–99)

## 2020-05-14 LAB — PHOSPHORUS: Phosphorus: 3.6 mg/dL (ref 2.5–4.6)

## 2020-05-14 LAB — MAGNESIUM: Magnesium: 1.7 mg/dL (ref 1.7–2.4)

## 2020-05-14 MED ORDER — DIATRIZOATE MEGLUMINE & SODIUM 66-10 % PO SOLN
90.0000 mL | Freq: Once | ORAL | Status: AC
  Administered 2020-05-14: 90 mL via NASOGASTRIC

## 2020-05-14 MED ORDER — MAGNESIUM SULFATE 2 GM/50ML IV SOLN
2.0000 g | Freq: Once | INTRAVENOUS | Status: AC
  Administered 2020-05-14: 17:00:00 2 g via INTRAVENOUS
  Filled 2020-05-14: qty 50

## 2020-05-14 MED ORDER — ENOXAPARIN SODIUM 60 MG/0.6ML ~~LOC~~ SOLN
55.0000 mg | SUBCUTANEOUS | Status: DC
  Administered 2020-05-14 – 2020-05-21 (×8): 55 mg via SUBCUTANEOUS
  Filled 2020-05-14 (×8): qty 0.6

## 2020-05-14 NOTE — Progress Notes (Signed)
Pt awoke with increase pain in abd 8/10. Abd feels slightly more distended than this morning. Gave PRN morphine. MD notified via secure chat, no new orders at this time. Will continue to monitor.

## 2020-05-14 NOTE — Progress Notes (Signed)
Pt given gastrographin. Suction turned off for one hour at this time.

## 2020-05-14 NOTE — Progress Notes (Signed)
[facility] SURGICAL PROGRESS NOTE (cpt [phone])  Hospital Day(s): 1.   Interval History:  Patient seen and examined no acute events or new complaints overnight.  Patient reports he feels a little bit better and less distended this morning. No fever, chills, nausea, emeis No with some degree of leukopenia to 2.5K; question component of dilution sCr normal at 1.13 but BUN slightly elevated at 41, UO - 750 ccs Mild hypochloremia to 97 otherwise no significant electrolyte derangements  NGT output recorded at 500 ccs with nothing recorded in last 12 hours He did pass a small amount of flatus reportedly  Review of Systems:  Constitutional: denies fever, chills  HEENT: denies cough or congestion  Respiratory: denies any shortness of breath  Cardiovascular: denies chest pain or palpitations  Gastrointestinal: + abdominal pain (improved), + distension (improved), denied N/V, or diarrhea/and bowel function as per interval history Genitourinary: denies burning with urination or urinary frequency  Vital signs in last 24 hours: [min-max] current  Temp:  [97.4 F (36.3 C)-98.1 F (36.7 C)] 97.8 F (36.6 C) (11/03 0501) Pulse Rate:  [72-105] 105 (11/03 0501) Resp:  [18-30] 20 (11/03 0501) BP: (110-143)/(48-97) 130/58 (11/03 0501) SpO2:  [91 %-98 %] 96 % (11/03 0501) Weight:  [116.6 kg] 116.6 kg (11/02 2042)     Height:  (6'4) Weight: 116.6 kg BMI (Calculated): 31.3   Intake/Output last 2 shifts:  11/02 0701 - 11/03 0700 In: 0  Out: 1750 [Urine:750; Emesis/NG output:500]   Physical Exam:  Constitutional: alert, cooperative and no distress  HENT: normocephalic without obvious abnormality, NGT in palce Eyes: PERRL, EOM's grossly intact and symmetric  Respiratory: breathing non-labored at rest  Cardiovascular: regular rate and sinus rhythm  Gastrointestinal: Soft, improvement in distension and tenderness, no rebound/guarind. certainly without peritonitis. Previous  exploratory laparotomy incisional scar again seen.  Musculoskeletal: no edema or wounds, motor and sensation grossly intact, NT    Labs:  CBC Latest Ref Rng & Units 05/14/2020 05/13/2020 04/07/2016  WBC 4.0 - 10.5 K/uL 2.5(L) 8.4 5.7  Hemoglobin 13.0 - 17.0 g/dL 46.5 68.1 27.5  Hematocrit 39 - 52 % 39.9 43.6 39.2(L)  Platelets 150 - 400 K/uL 114(L) 216 130(L)   CMP Latest Ref Rng & Units 05/14/2020 05/13/2020 04/07/2016  Glucose 70 - 99 mg/dL 170(Y) 174(B) 449(Q)  BUN 6 - 20 mg/dL 75(F) 16(B) 8  Creatinine 0.61 - 1.24 mg/dL 8.46 6.59 9.35(T)  Sodium 135 - 145 mmol/L 136 134(L) 139  Potassium 3.5 - 5.1 mmol/L 3.7 4.5 3.8  Chloride 98 - 111 mmol/L 97(L) 100 109  CO2 22 - 32 mmol/L 26 19(L) 24  Calcium 8.9 - 10.3 mg/dL 0.1(X) 7.9(T) 9.0(Z)  Total Protein 6.5 - 8.1 g/dL - 6.8 -  Total Bilirubin 0.3 - 1.2 mg/dL - 1.1 -  Alkaline Phos 38 - 126 U/L - 60 -  AST 15 - 41 U/L - 24 -  ALT 0 - 44 U/L - 16 -     Imaging studies:  KUB (05/14/2020) personally reviewed showing distended loops of small bowel in the right abdomen, unchanged. There is some gas in descending colon, and radiologist report reviewed below:  IMPRESSION: 1. Enteric tube placed into the stomach. 2. Dilated small bowel loops unchanged from yesterday. Slightly more gas in the descending colon.   Assessment/Plan: (ICD-10's: K70.609) 49 y.o. 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    - Will get gastrografin challenge this morning to reassess for improvement  - Continue NGT decompression to LIS; monitor and record output  - NPO + IVF resuscitation             - Monitor abdominal examination; on-going bowel function             - Pain control prn; antiemetics prn             - No  emergent surgical intervention. He understands that should conservative measures fail then he may require exploration. Pending gastrografin challenge today.              - Medical management of comorbidities; hold home medications for now; added SSI             - Mobilization encouraged as feasible             - DVT prophylaxis  All of the above findings and recommendations were discussed with the patient, and the medical team, and all of patient's questions were answered to his expressed satisfaction.  -- Lynden Oxford, PA-C [facility] 05/14/2020, 7:13 AM [phone] M-F: 7am - 4pm

## 2020-05-14 NOTE — Plan of Care (Signed)

## 2020-05-14 NOTE — Progress Notes (Signed)
Anticoagulation monitoring(Lovenox):  49yo  M ordered Lovenox 40 mg Q24h  Filed Weights   05/13/20 0246 05/13/20 2042  Weight: 118.8 kg (262 lb) 116.6 kg (257 lb)   BMI 31.28   Lab Results  Component Value Date   CREATININE 1.13 05/14/2020   CREATININE 1.09 05/13/2020   CREATININE 0.59 (L) 04/07/2016   Estimated Creatinine Clearance: 111.6 mL/min (by C-G formula based on SCr of 1.13 mg/dL). Hemoglobin & Hematocrit     Component Value Date/Time   HGB 13.6 05/14/2020 0510   HGB 13.4 12/14/2013 1344   HCT 39.9 05/14/2020 0510   HCT 39.8 (L) 12/14/2013 1344     Per Protocol for Patient with estCrcl > 30 ml/min and BMI > 30, will transition to Lovenox 0.5 mg/kg (55 mg) Q24h     Bari Mantis PharmD Clinical Pharmacist 05/14/2020

## 2020-05-14 NOTE — Progress Notes (Signed)
KUB post contrast was taken. MD notified. Orders per MD:  for now, keep NPO, IV fluids, NG to int suction.

## 2020-05-14 NOTE — Plan of Care (Signed)
  Problem: Education: Goal: Knowledge of General Education information will improve Description Including pain rating scale, medication(s)/side effects and non-pharmacologic comfort measures Outcome: Progressing   

## 2020-05-15 ENCOUNTER — Inpatient Hospital Stay: Payer: Medicare Other | Admitting: Anesthesiology

## 2020-05-15 ENCOUNTER — Inpatient Hospital Stay: Payer: Medicare Other

## 2020-05-15 ENCOUNTER — Encounter
Admission: EM | Disposition: A | Discharge status: Home Health | Payer: Self-pay | Source: Home / Self Care | Attending: Surgery

## 2020-05-15 ENCOUNTER — Encounter: Payer: Self-pay | Admitting: Surgery

## 2020-05-15 ENCOUNTER — Inpatient Hospital Stay: Payer: Self-pay

## 2020-05-15 DIAGNOSIS — K56609 Unspecified intestinal obstruction, unspecified as to partial versus complete obstruction: Secondary | ICD-10-CM

## 2020-05-15 DIAGNOSIS — K660 Peritoneal adhesions (postprocedural) (postinfection): Secondary | ICD-10-CM

## 2020-05-15 DIAGNOSIS — R188 Other ascites: Secondary | ICD-10-CM

## 2020-05-15 HISTORY — PX: LAPAROTOMY: SHX154

## 2020-05-15 LAB — BASIC METABOLIC PANEL
Anion gap: 11 (ref 5–15)
BUN: 38 mg/dL — ABNORMAL HIGH (ref 6–20)
CO2: 29 mmol/L (ref 22–32)
Calcium: 8.1 mg/dL — ABNORMAL LOW (ref 8.9–10.3)
Chloride: 95 mmol/L — ABNORMAL LOW (ref 98–111)
Creatinine, Ser: 1.04 mg/dL (ref 0.61–1.24)
GFR, Estimated: >60 mL/min (ref >60)
Glucose, Bld: 146 mg/dL — ABNORMAL HIGH (ref 70–99)
Potassium: 3.4 mmol/L — ABNORMAL LOW (ref 3.5–5.1)
Sodium: 135 mmol/L (ref 135–145)

## 2020-05-15 LAB — CBC
HCT: 33.9 % — ABNORMAL LOW (ref 39.0–52.0)
Hemoglobin: 11.5 g/dL — ABNORMAL LOW (ref 13.0–17.0)
MCH: 29.1 pg (ref 26.0–34.0)
MCHC: 33.9 g/dL (ref 30.0–36.0)
MCV: 85.8 fL (ref 80.0–100.0)
Platelets: 109 10*3/uL — ABNORMAL LOW (ref 150–400)
RBC: 3.95 MIL/uL — ABNORMAL LOW (ref 4.22–5.81)
RDW: 13.9 % (ref 11.5–15.5)
WBC: 2.4 10*3/uL — ABNORMAL LOW (ref 4.0–10.5)
nRBC: 0 % (ref 0.0–0.2)

## 2020-05-15 LAB — GLUCOSE, CAPILLARY
Glucose-Capillary: 106 mg/dL — ABNORMAL HIGH (ref 70–99)
Glucose-Capillary: 106 mg/dL — ABNORMAL HIGH (ref 70–99)
Glucose-Capillary: 115 mg/dL — ABNORMAL HIGH (ref 70–99)
Glucose-Capillary: 140 mg/dL — ABNORMAL HIGH (ref 70–99)
Glucose-Capillary: 146 mg/dL — ABNORMAL HIGH (ref 70–99)
Glucose-Capillary: 182 mg/dL — ABNORMAL HIGH (ref 70–99)

## 2020-05-15 LAB — MAGNESIUM: Magnesium: 2.3 mg/dL (ref 1.7–2.4)

## 2020-05-15 SURGERY — LAPAROTOMY, EXPLORATORY
Anesthesia: General

## 2020-05-15 MED ORDER — SUCCINYLCHOLINE CHLORIDE 200 MG/10ML IV SOSY
PREFILLED_SYRINGE | INTRAVENOUS | Status: AC
  Filled 2020-05-15: qty 10

## 2020-05-15 MED ORDER — ROCURONIUM BROMIDE 100 MG/10ML IV SOLN
INTRAVENOUS | Status: DC | PRN
  Administered 2020-05-15: 30 mg via INTRAVENOUS
  Administered 2020-05-15 (×2): 20 mg via INTRAVENOUS
  Administered 2020-05-15: 50 mg via INTRAVENOUS

## 2020-05-15 MED ORDER — DEXMEDETOMIDINE (PRECEDEX) IN NS 20 MCG/5ML (4 MCG/ML) IV SYRINGE
PREFILLED_SYRINGE | INTRAVENOUS | Status: DC | PRN
  Administered 2020-05-15: 12 ug via INTRAVENOUS
  Administered 2020-05-15: 8 ug via INTRAVENOUS

## 2020-05-15 MED ORDER — BUPIVACAINE-EPINEPHRINE (PF) 0.25% -1:200000 IJ SOLN
INTRAMUSCULAR | Status: AC
  Filled 2020-05-15: qty 30

## 2020-05-15 MED ORDER — HYDROMORPHONE HCL 1 MG/ML IJ SOLN
0.5000 mg | INTRAMUSCULAR | Status: DC | PRN
  Administered 2020-05-15 – 2020-05-19 (×11): 0.5 mg via INTRAVENOUS
  Filled 2020-05-15 (×11): qty 1

## 2020-05-15 MED ORDER — MIDAZOLAM HCL 2 MG/2ML IJ SOLN
INTRAMUSCULAR | Status: DC | PRN
  Administered 2020-05-15: 2 mg via INTRAVENOUS

## 2020-05-15 MED ORDER — PROPOFOL 10 MG/ML IV BOLUS
INTRAVENOUS | Status: AC
  Filled 2020-05-15: qty 20

## 2020-05-15 MED ORDER — DEXMEDETOMIDINE (PRECEDEX) IN NS 20 MCG/5ML (4 MCG/ML) IV SYRINGE
PREFILLED_SYRINGE | INTRAVENOUS | Status: AC
  Filled 2020-05-15: qty 5

## 2020-05-15 MED ORDER — FENTANYL CITRATE (PF) 100 MCG/2ML IJ SOLN
25.0000 ug | INTRAMUSCULAR | Status: DC | PRN
  Administered 2020-05-15: 25 ug via INTRAVENOUS

## 2020-05-15 MED ORDER — ONDANSETRON HCL 4 MG/2ML IJ SOLN
INTRAMUSCULAR | Status: AC
  Filled 2020-05-15: qty 2

## 2020-05-15 MED ORDER — ONDANSETRON HCL 4 MG/2ML IJ SOLN
INTRAMUSCULAR | Status: DC | PRN
  Administered 2020-05-15: 4 mg via INTRAVENOUS

## 2020-05-15 MED ORDER — ROCURONIUM BROMIDE 10 MG/ML (PF) SYRINGE
PREFILLED_SYRINGE | INTRAVENOUS | Status: AC
  Filled 2020-05-15: qty 20

## 2020-05-15 MED ORDER — SUCCINYLCHOLINE CHLORIDE 20 MG/ML IJ SOLN
INTRAMUSCULAR | Status: DC | PRN
  Administered 2020-05-15: 200 mg via INTRAVENOUS

## 2020-05-15 MED ORDER — CHLORHEXIDINE GLUCONATE CLOTH 2 % EX PADS
6.0000 | MEDICATED_PAD | Freq: Every day | CUTANEOUS | Status: DC
  Administered 2020-05-16 – 2020-05-23 (×9): 6 via TOPICAL

## 2020-05-15 MED ORDER — OXYCODONE HCL 5 MG PO TABS: 5.0000 mg | ORAL_TABLET | Freq: Once | ORAL | Status: DC | PRN

## 2020-05-15 MED ORDER — SODIUM CHLORIDE FLUSH 0.9 % IV SOLN
INTRAVENOUS | Status: AC
  Filled 2020-05-15: qty 50

## 2020-05-15 MED ORDER — SODIUM CHLORIDE 0.9 % IR SOLN
Status: DC | PRN
  Administered 2020-05-15: 2000 mL

## 2020-05-15 MED ORDER — TRAVASOL 10 % IV SOLN
INTRAVENOUS | Status: AC
  Filled 2020-05-15: qty 672

## 2020-05-15 MED ORDER — KETOROLAC TROMETHAMINE 30 MG/ML IJ SOLN
30.0000 mg | Freq: Four times a day (QID) | INTRAMUSCULAR | Status: AC
  Administered 2020-05-15 – 2020-05-18 (×13): 30 mg via INTRAVENOUS
  Filled 2020-05-15 (×13): qty 1

## 2020-05-15 MED ORDER — LACTATED RINGERS IV SOLN: INTRAVENOUS | Status: DC | PRN

## 2020-05-15 MED ORDER — ALBUMIN HUMAN 5 % IV SOLN: INTRAVENOUS | Status: DC | PRN

## 2020-05-15 MED ORDER — FENTANYL CITRATE (PF) 100 MCG/2ML IJ SOLN
INTRAMUSCULAR | Status: AC
  Filled 2020-05-15: qty 2

## 2020-05-15 MED ORDER — SUGAMMADEX SODIUM 500 MG/5ML IV SOLN
INTRAVENOUS | Status: DC | PRN
  Administered 2020-05-15: 500 mg via INTRAVENOUS

## 2020-05-15 MED ORDER — KETOROLAC TROMETHAMINE 30 MG/ML IJ SOLN
INTRAMUSCULAR | Status: AC
  Filled 2020-05-15: qty 1

## 2020-05-15 MED ORDER — BUPIVACAINE LIPOSOME 1.3 % IJ SUSP
INTRAMUSCULAR | Status: AC
  Filled 2020-05-15: qty 20

## 2020-05-15 MED ORDER — LIDOCAINE HCL (CARDIAC) PF 100 MG/5ML IV SOSY
PREFILLED_SYRINGE | INTRAVENOUS | Status: DC | PRN
  Administered 2020-05-15: 100 mg via INTRAVENOUS

## 2020-05-15 MED ORDER — FENTANYL CITRATE (PF) 100 MCG/2ML IJ SOLN
INTRAMUSCULAR | Status: AC
  Administered 2020-05-15: 25 ug via INTRAVENOUS
  Filled 2020-05-15: qty 2

## 2020-05-15 MED ORDER — ACETAMINOPHEN 10 MG/ML IV SOLN
INTRAVENOUS | Status: AC
  Filled 2020-05-15: qty 100

## 2020-05-15 MED ORDER — SODIUM CHLORIDE 0.9% FLUSH
10.0000 mL | Freq: Two times a day (BID) | INTRAVENOUS | Status: DC
  Administered 2020-05-15 – 2020-05-23 (×14): 10 mL

## 2020-05-15 MED ORDER — SODIUM CHLORIDE 0.9% FLUSH
10.0000 mL | INTRAVENOUS | Status: DC | PRN
  Administered 2020-05-18: 10:00:00 10 mL

## 2020-05-15 MED ORDER — MIDAZOLAM HCL 2 MG/2ML IJ SOLN
INTRAMUSCULAR | Status: AC
  Filled 2020-05-15: qty 2

## 2020-05-15 MED ORDER — SUGAMMADEX SODIUM 500 MG/5ML IV SOLN
INTRAVENOUS | Status: AC
  Filled 2020-05-15: qty 5

## 2020-05-15 MED ORDER — DEXTROSE IN LACTATED RINGERS 5 % IV SOLN: INTRAVENOUS | Status: DC

## 2020-05-15 MED ORDER — FENTANYL CITRATE (PF) 100 MCG/2ML IJ SOLN
INTRAMUSCULAR | Status: DC | PRN
  Administered 2020-05-15: 100 ug via INTRAVENOUS

## 2020-05-15 MED ORDER — PROPOFOL 10 MG/ML IV BOLUS
INTRAVENOUS | Status: DC | PRN
  Administered 2020-05-15: 120 mg via INTRAVENOUS

## 2020-05-15 MED ORDER — SODIUM CHLORIDE 0.9 % IV SOLN
INTRAVENOUS | Status: DC | PRN
  Administered 2020-05-15: 80 mL

## 2020-05-15 MED ORDER — OXYCODONE HCL 5 MG/5ML PO SOLN: 5.0000 mg | Freq: Once | ORAL | Status: DC | PRN

## 2020-05-15 MED ORDER — PHENYLEPHRINE HCL (PRESSORS) 10 MG/ML IV SOLN
INTRAVENOUS | Status: DC | PRN
  Administered 2020-05-15 (×4): 100 ug via INTRAVENOUS

## 2020-05-15 MED ORDER — DEXTROSE 5 % IV SOLN: INTRAVENOUS | Status: DC | PRN

## 2020-05-15 MED ORDER — SODIUM CHLORIDE 0.9 % IV SOLN
INTRAVENOUS | Status: AC
  Filled 2020-05-15: qty 2

## 2020-05-15 MED ORDER — ACETAMINOPHEN 10 MG/ML IV SOLN
INTRAVENOUS | Status: DC | PRN
  Administered 2020-05-15: 1000 mg via INTRAVENOUS

## 2020-05-15 MED ORDER — SODIUM CHLORIDE 0.9 % IV SOLN
2.0000 g | INTRAVENOUS | Status: AC
  Administered 2020-05-15: 2 g via INTRAVENOUS
  Filled 2020-05-15: qty 2

## 2020-05-15 MED ORDER — KETOROLAC TROMETHAMINE 30 MG/ML IJ SOLN
INTRAMUSCULAR | Status: DC | PRN
  Administered 2020-05-15: 30 mg via INTRAVENOUS

## 2020-05-15 SURGICAL SUPPLY — 45 items
ADH SKN CLS APL DERMABOND .7 (GAUZE/BANDAGES/DRESSINGS) ×1
APL PRP STRL LF ISPRP CHG 10.5 (MISCELLANEOUS) ×1
APPLICATOR CHLORAPREP 10.5 ORG (MISCELLANEOUS) ×3 IMPLANT
BRR ADH 6X5 SEPRAFILM 1 SHT (MISCELLANEOUS)
CANISTER SUCT 1200ML W/VALVE (MISCELLANEOUS) ×3 IMPLANT
COVER WAND RF STERILE (DRAPES) ×3 IMPLANT
DERMABOND ADVANCED (GAUZE/BANDAGES/DRESSINGS) ×2
DERMABOND ADVANCED .7 DNX12 (GAUZE/BANDAGES/DRESSINGS) ×1 IMPLANT
DRAPE LAPAROTOMY 100X77 ABD (DRAPES) ×6 IMPLANT
DRSG OPSITE POSTOP 4X12 (GAUZE/BANDAGES/DRESSINGS) ×3 IMPLANT
DRSG TEGADERM 4X10 (GAUZE/BANDAGES/DRESSINGS) ×3 IMPLANT
ELECT CAUTERY BLADE 6.4 (BLADE) ×3 IMPLANT
ELECT REM PT RETURN 9FT ADLT (ELECTROSURGICAL) ×3
ELECTRODE REM PT RTRN 9FT ADLT (ELECTROSURGICAL) ×1 IMPLANT
GAUZE SPONGE 4X4 12PLY STRL (GAUZE/BANDAGES/DRESSINGS) ×3 IMPLANT
GLOVE SURG SYN 7.0 (GLOVE) ×6 IMPLANT
GLOVE SURG SYN 7.5  E (GLOVE) ×6
GLOVE SURG SYN 7.5 E (GLOVE) ×2 IMPLANT
GOWN STRL REUS W/ TWL LRG LVL3 (GOWN DISPOSABLE) ×4 IMPLANT
GOWN STRL REUS W/TWL LRG LVL3 (GOWN DISPOSABLE) ×12
HOLDER FOLEY CATH W/STRAP (MISCELLANEOUS) ×3 IMPLANT
LABEL OR SOLS (LABEL) ×3 IMPLANT
LIGASURE IMPACT 36 18CM CVD LR (INSTRUMENTS) ×3 IMPLANT
NEEDLE HYPO 22GX1.5 SAFETY (NEEDLE) ×3 IMPLANT
NS IRRIG 1000ML POUR BTL (IV SOLUTION) ×3 IMPLANT
PACK BASIN MAJOR ARMC (MISCELLANEOUS) ×3 IMPLANT
PACK COLON CLEAN CLOSURE (MISCELLANEOUS) ×3 IMPLANT
PATTIES SURGICAL .5 X3 (DISPOSABLE) ×3 IMPLANT
SEPRAFILM MEMBRANE 5X6 (MISCELLANEOUS) IMPLANT
SPONGE LAP 18X18 RF (DISPOSABLE) ×6 IMPLANT
STAPLER SKIN PROX 35W (STAPLE) ×3 IMPLANT
SUT MNCRL AB 4-0 PS2 18 (SUTURE) ×6 IMPLANT
SUT PDS AB 1 CT1 36 (SUTURE) ×6 IMPLANT
SUT PROLENE 2 0 SH DA (SUTURE) ×3 IMPLANT
SUT SILK 2 0 (SUTURE) ×3
SUT SILK 2-0 18XBRD TIE 12 (SUTURE) ×1 IMPLANT
SUT SILK 3-0 (SUTURE) ×3 IMPLANT
SUT VIC AB 3-0 SH 27 (SUTURE) ×9
SUT VIC AB 3-0 SH 27X BRD (SUTURE) ×3 IMPLANT
SYR 10ML LL (SYRINGE) ×3 IMPLANT
SYR 20ML LL LF (SYRINGE) ×6 IMPLANT
SYR TOOMEY IRRIG 70ML (MISCELLANEOUS) ×3
SYRINGE TOOMEY IRRIG 70ML (MISCELLANEOUS) ×1 IMPLANT
TAMPON NAS POPE NO STRING (MISCELLANEOUS) ×3 IMPLANT
TRAY FOLEY MTR SLVR 16FR STAT (SET/KITS/TRAYS/PACK) ×3 IMPLANT

## 2020-05-15 NOTE — Anesthesia Preprocedure Evaluation (Addendum)
Anesthesia Evaluation  Patient identified by MRN, date of birth, ID band Patient awake    Reviewed: Allergy & Precautions, H&P , NPO status , Patient's Chart, lab work & pertinent test results  History of Anesthesia Complications Negative for: history of anesthetic complications  Airway Mallampati: III  TM Distance: >3 FB Neck ROM: full    Dental  (+) Chipped   Pulmonary neg pulmonary ROS, neg shortness of breath,    Pulmonary exam normal        Cardiovascular Exercise Tolerance: Good hypertension, Normal cardiovascular exam     Neuro/Psych PSYCHIATRIC DISORDERS negative neurological ROS     GI/Hepatic negative GI ROS, Neg liver ROS, neg GERD  ,  Endo/Other  diabetes, Type 2, Insulin Dependent  Renal/GU Renal disease     Musculoskeletal   Abdominal   Peds  Hematology negative hematology ROS (+)   Anesthesia Other Findings SBO  Right sided weakness at baseline  Past Medical History: No date: Cerebral palsy (HCC) No date: Depression No date: Diabetes mellitus without complication (HCC) No date: Hypertension No date: Kidney stones No date: Schizoaffective disorder (HCC) No date: Scoliosis  Past Surgical History: No date: BOWEL RESECTION  BMI    Body Mass Index: 31.28 kg/m      Reproductive/Obstetrics negative OB ROS                             Anesthesia Physical Anesthesia Plan  ASA: IV and emergent  Anesthesia Plan: General ETT, Rapid Sequence and Cricoid Pressure   Post-op Pain Management:    Induction: Intravenous  PONV Risk Score and Plan: Ondansetron, Dexamethasone, Midazolam and Treatment may vary due to age or medical condition  Airway Management Planned: Oral ETT and Video Laryngoscope Planned  Additional Equipment:   Intra-op Plan:   Post-operative Plan: Extubation in OR and Possible Post-op intubation/ventilation  Informed Consent: I have reviewed  the patients History and Physical, chart, labs and discussed the procedure including the risks, benefits and alternatives for the proposed anesthesia with the patient or authorized representative who has indicated his/her understanding and acceptance.     Dental Advisory Given  Plan Discussed with: Anesthesiologist, CRNA and Surgeon  Anesthesia Plan Comments: (Patient consented, Unable to reach the patient brother, also consented the patient aunt Bridgette Habermann Phillis at [phone] emergently    They were consented for risks of anesthesia including but not limited to:  - adverse reactions to medications - damage to eyes, teeth, lips or other oral mucosa - nerve damage due to positioning  - sore throat or hoarseness - Damage to heart, brain, nerves, lungs, other parts of body or loss of life  They voiced understanding.)       Anesthesia Quick Evaluation

## 2020-05-15 NOTE — Progress Notes (Signed)
Peripherally Inserted Central Catheter Placement  The IV Nurse has discussed with the patient and/or persons authorized to consent for the patient, the purpose of this procedure and the potential benefits and risks involved with this procedure.  The benefits include less needle sticks, lab draws from the catheter, and the patient may be discharged home with the catheter. Risks include, but not limited to, infection, bleeding, blood clot (thrombus formation), and puncture of an artery; nerve damage and irregular heartbeat and possibility to perform a PICC exchange if needed/ordered by physician.  Alternatives to this procedure were also discussed.  Bard Power PICC patient education guide, fact sheet on infection prevention and patient information card has been provided to patient /or left at bedside.    PICC Placement Documentation  PICC Double Lumen 05/15/20 PICC Left Cephalic 49 cm 0 cm (Active)  Indication for Insertion or Continuance of Line Administration of hyperosmolar/irritating solutions (i.e. TPN, Vancomycin, etc.) 05/15/20 2121  Exposed Catheter (cm) 0 cm 05/15/20 2121  Site Assessment Clean;Dry;Intact 05/15/20 2121  Lumen #1 Status Blood return noted;Flushed;Saline locked 05/15/20 2121  Lumen #2 Status Blood return noted;Flushed;Saline locked 05/15/20 2121  Dressing Type Transparent;Occlusive;Securing device 05/15/20 2121  Dressing Status Clean;Dry;Intact 05/15/20 2121  Antimicrobial disc in place? Yes 05/15/20 2121  Safety Lock Not Applicable 05/15/20 2121  Line Adjustment (NICU/IV Team Only) No 05/15/20 2121  Dressing Intervention New dressing 05/15/20 2121  Dressing Change Due 05/22/20 05/15/20 2121       Christeen Douglas 05/15/2020, 9:31 PM

## 2020-05-15 NOTE — Anesthesia Procedure Notes (Addendum)
Procedure Name: Intubation Date/Time: 05/15/2020 9:56 AM Performed by: Irving Burton, CRNA Pre-anesthesia Checklist: Patient identified, Patient being monitored, Timeout performed, Emergency Drugs available and Suction available Patient Re-evaluated:Patient Re-evaluated prior to induction Oxygen Delivery Method: Circle system utilized Preoxygenation: Pre-oxygenation with 100% oxygen Induction Type: IV induction Ventilation: Mask ventilation without difficulty Laryngoscope Size: McGraph and 4 Grade View: Grade I Tube type: Oral Tube size: 7.5 mm Number of attempts: 1 Airway Equipment and Method: Stylet Placement Confirmation: ETT inserted through vocal cords under direct vision,  positive ETCO2 and breath sounds checked- equal and bilateral Secured at: 22 cm Tube secured with: Tape Dental Injury: Teeth and Oropharynx as per pre-operative assessment

## 2020-05-15 NOTE — Progress Notes (Signed)
[facility] SURGICAL PROGRESS NOTE   Hospital Day(s): 2.   Interval History:  Patient seen and examined no acute events or new complaints overnight.  Still with leukopenia this morning to 2.4, no fevers Renal function remains reasonably normal, sCr 1.04, BUN 38, UO - 450 Mild hypokalemia to 3.4 o/w no electrolyte derangements  NGT with 750 ccs out recorded   Vital signs in last 24 hours: [min-max] current  Temp:  [97.4 F (36.3 C)-98.6 F (37 C)] 98.6 F (37 C) (11/04 0457) Pulse Rate:  [93-103] 98 (11/04 0457) Resp:  [15-20] 20 (11/04 0457) BP: (110-131)/(53-66) 110/66 (11/04 0457) SpO2:  [91 %-96 %] 92 % (11/04 0457)     Height:  (6'4) Weight: 116.6 kg BMI (Calculated): 31.3   Intake/Output last 2 shifts:  11/03 0701 - 11/04 0700 In: 3032.9 [I.V.:3032.9] Out: 1200 [Urine:450; Emesis/NG output:750]   Physical Exam:  Constitutional: alert, cooperative and no distress  HENT: normocephalic without obvious abnormality, NGT in place Eyes: PERRL, EOM's grossly intact and symmetric  Respiratory: breathing non-labored at rest  Cardiovascular: regular rate and sinus rhythm  Gastrointestinal: Soft, remains distended, no significant improvement, certainly without peritonitis. Previous exploratory laparotomy incisional scar again seen.  Musculoskeletal: no edema or wounds, motor and sensation grossly intact, NT    Labs:  CBC Latest Ref Rng & Units 05/15/2020 05/14/2020 05/13/2020  WBC 4.0 - 10.5 K/uL 2.4(L) 2.5(L) 8.4  Hemoglobin 13.0 - 17.0 g/dL 11.5(L) 13.6 14.8  Hematocrit 39 - 52 % 33.9(L) 39.9 43.6  Platelets 150 - 400 K/uL 109(L) 114(L) 216   CMP Latest Ref Rng & Units 05/15/2020 05/14/2020 05/13/2020  Glucose 70 - 99 mg/dL 106(Y) 694(W) 546(E)  BUN 6 - 20 mg/dL 70(J) 50(K) 93(G)  Creatinine 0.61 - 1.24 mg/dL 1.82 9.93 7.16  Sodium 135 - 145 mmol/L 135 136 134(L)  Potassium 3.5 - 5.1 mmol/L 3.4(L) 3.7 4.5  Chloride 98 - 111 mmol/L 95(L) 97(L) 100  CO2 22 -  32 mmol/L 29 26 19(L)  Calcium 8.9 - 10.3 mg/dL 8.1(L) 8.6(L) 8.8(L)  Total Protein 6.5 - 8.1 g/dL - - 6.8  Total Bilirubin 0.3 - 1.2 mg/dL - - 1.1  Alkaline Phos 38 - 126 U/L - - 60  AST 15 - 41 U/L - - 24  ALT 0 - 44 U/L - - 16     Imaging studies:  KUB (05/15/2020) personally reviewed showing persistently dilated small bowel in the right abdomen with air-fluid levels on upright films, no significant change, and radiologist report reviewed: IMPRESSION: 1. Enteric tube terminates in the proximal stomach. 2. Stable to worsened moderate to marked diffuse small bowel dilatation with air-fluid levels, compatible with persistent distal small bowel obstruction.   KUB - 8hr Delay (05/14/2020) personally reviewed showing contrast material in dilated small bowel, relatively unchanged from prior, and radiologist report reviewed IMPRESSION: Persistent small-bowel obstruction. Contrast material lies in the small bowel in the right abdomen but has not definitively reached the colon. Continued follow-up is recommended.   Assessment/Plan: (ICD-10's: K73.609) 49 y.o. 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    - *Unfortunately, he does not appear to have made any clinical or radiographic improvement to this point, At this tim, I think it will be in his best interest to pursue exploratory laparotomy and likely lysis of adhesions. We will plan on  proceeding this morning with Dr Aleen Campi pending OR/Anesthesia availability.   - Continue NGT decompression to LIS; monitor and record output             - NPO + IVF resuscitation - Monitor abdominal examination; on-going bowel function - Pain control prn; antiemetics prn   - Medical  management of comorbidities; hold home medications for now; added SSI - Mobilization encouraged as feasible - DVT prophylaxis; Hold  All of the above findings and recommendations were discussed with the patient, and the medical team, and all of patient's questions were answered to his expressed satisfaction.  -- Lynden Oxford, PA-C [facility] 05/15/2020, 7:17 AM [phone] M-F: 7am - 4pm

## 2020-05-15 NOTE — Progress Notes (Signed)
Pt transported to surgery by OR tech. Vitals stable, pt comfortable at this time.

## 2020-05-15 NOTE — Progress Notes (Signed)
Initial Nutrition Assessment  DOCUMENTATION CODES:   Obesity unspecified  INTERVENTION:   TPN per pharmacy   Pt at moderate refeed risk; recommend monitor potassium, magnesium and phosphorus labs daily until stable  NUTRITION DIAGNOSIS:   Inadequate oral intake related to altered GI function (SBO) as evidenced by NPO status.  GOAL:   Patient will meet greater than or equal to 90% of their needs  MONITOR:   Diet advancement, Labs, Weight trends, Skin, I & O's, Other (Comment) (TPN)  REASON FOR ASSESSMENT:   Consult New TPN/TNA  ASSESSMENT:   49 y.o. 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 Per chart review, pt with poor po for 3 days pta r/t nausea, vomiting and abdominal pain. Plan is for PICC line and TPN either today or tomorrow pending line placement. Pt likely at moderate refeed risk. NGT in place with output this morning. Per chart, pt down 43lbs(13%) since October 2020; RD unsure how recently weight loss occurred and wether or not it was intentional. RD will plan to obtain NFPE and nutrition related history at follow-up.   Medications reviewed and include: lovenox, insulin, synthroid, protonix, LRS w/ 5% dextrose @50ml /hr  Labs reviewed: K 3.4(L), BUN 38(H), Mg 2.3 wnl P 3.6 wnl- 11/3 Wbc- 2.4(L) cbgs- 115, 140, 106, 106 x 24 hrs AIC 5.7(H)- 11/2  NUTRITION - FOCUSED PHYSICAL EXAM: Unable to perform at this time   Diet Order:   Diet Order            Diet NPO time specified Except for: Sips with Meds  Diet effective now                EDUCATION NEEDS:   Not appropriate for education at this time  Skin:  Skin Assessment: Reviewed RN Assessment (incision abdomen)  Last BM:   10/30  Height:   Ht Readings from Last 1 Encounters:  05/15/20 6\' 4"  (1.93 m)    Weight:   Wt Readings from Last 1 Encounters:  05/15/20 116.6 kg    Ideal Body Weight:  91.8 kg  BMI:  Body mass index is 31.29 kg/m.  Estimated Nutritional Needs:   Kcal:  2600-2900kcal/day  Protein:  130-145g/day  Fluid:  2.8-3.1L/day  MS, RD, LDN Please refer to [facility] for RD and/or RD on-call/weekend/after hours pager

## 2020-05-15 NOTE — Op Note (Signed)
  Procedure Date:  05/15/2020  Pre-operative Diagnosis:  Small bowel obstruction  Post-operative Diagnosis:  Small bowel obstructiom  Procedure:  Exploratory Laparotomy and extensive lysis of adhesions of > 90 minutes; debridement of skin and subcutaneous tissue for 30 cm 2.  Surgeon:  Howie Ill, MD  Assistants:  Hulda Marin, MD and Sterling Big, MD (both critical for exposure, dissection), and April Greissinger, PA-S  Anesthesia:  General endotracheal  Estimated Blood Loss:  50 ml  NG contents:  2,000 ml  Specimens:  None  Complications:  None  Indications for Procedure:  This is a 49 y.o. male who presents with small bowel obstruction, which failed conservative management and requires surgery.  The risks of bleeding, abscess or infection, injury to surrounding structures, and need for further procedures were all discussed with the patient and was willing to proceed.  Description of Procedure: The patient was correctly identified in the preoperative area and brought into the operating room.  The patient was placed supine with VTE prophylaxis in place.  Appropriate time-outs were performed.  Anesthesia was induced and the patient was intubated.  Foley catheter was placed.  Appropriate antibiotics were infused.  The abdomen was prepped and draped in a sterile fashion.  A midline incision was made incorporating the patient's previous midline scar. Electrocautery was used to dissect down the subcutaneous tissue and excise the patient's previous scar with underlying subcutaneous tissue.  We then proceeded our dissection down to the fascia.  The fascia was incised and extended superiorly and inferiorly.  Immediately the omentum was adhered to the anterior abdominal wall, which required cautery to dissect free.  Once the full midline incision was made, we noted significant ascites fluid in the abdomen and very distended small bowel.  He had 1200 ml of ascitic fluid suctioned.  We then  proceeded with very extensive, lengthy lysis of adhesions between small bowel loops, omentum and small bowel, omentum and abdominal wall.  This was done with combination of cautery and LigaSure.  This allowed [place] to evaluate the patient's previous ileocolonic anastomosis.  This proved to be patent, but the contents proximal to the anastomosis were fecalized.  These were pushed distally without any problems into the colon without any evidence of obstruction or stenosis.  After this, there was further adhesions noted from small bowel to the right upper quadrant by the liver, and these were taken down using cautery as well.  The patient's malrotated anatomy was further evaluated, with duodenum not flowing in the traditional C-loop pattern.  The root of the mesentery was also oriented differently.  Once all adhesions were lysed, we ran the small bowel and no bowel injury was noted.  Two small mesenteric defects were closed using 3-0 Silk suture.  The small bowel contents were milked proximally into the stomach for about 2 L of contents.    The abdomen was thoroughly irrigated.  Exparel solution combined with 0.25% bupivacaine with epi and saline were infiltrated onto the peritoneum, fascia, and subcutaneous tissue.  The fascia was closed using #1 PDS sutures.  The midline wound was irrigated and closed in layers using 3-0 Vicryl and 4-0 Monocryl.  The wound was cleaned and sealed with DermaBond.   The patient was emerged from anesthesia and extubated and brought to the recovery room for further management.  The patient tolerated the procedure well and all counts were correct at the end of the case.   Howie Ill, MD

## 2020-05-15 NOTE — TOC Initial Note (Addendum)
Transition of Care [facility] - Initial/Assessment Note    Patient Details  Name: Derrick Atkins MRN: [id number] Date of Birth: 08-May-1971  Transition of [facility]) CM/SW Contact:    [name], LCSW Phone Number: 05/15/2020, 10:03 AM  Clinical Narrative:             RN asked CSW to help find contact information for Legal Guardian of patient. Completed chart review. Contacted Legal Guardian [name] [phone]) and added her information to chart. She confirmed she is patient's Legal Guardian. Patient lives alone. Her mother, Bridgette Habermann, is involved and helps patient with errands, etc. [name] reported updates can be given to her or Hope (both are in chart). No DME, HH, or SNF history. PCP is Dr. Laural Benes. Pharmacy is [facility].    Expected Discharge Plan: Home/Self Care Barriers to Discharge: Continued Medical Work up   Patient Goals and CMS Choice Patient states their goals for this hospitalization and ongoing recovery are:: home with family supports   Choice offered to / list presented to : [facility] POA / Guardian  Expected Discharge Plan and Services Expected Discharge Plan: Home/Self Care       Living arrangements for the past 2 months: Single Family Home                                      Prior Living Arrangements/Services Living arrangements for the past 2 months: Single Family Home Lives with:: Self Patient language and need for interpreter reviewed:: Yes Do you feel safe going back to the place where you live?: Yes      Need for Family Participation in Patient Care: Yes (Comment) Care giver support system in place?: Yes (comment)   Criminal Activity/Legal Involvement Pertinent to Current Situation/Hospitalization: No - Comment as needed  Activities of Daily Living Home Assistive Devices/Equipment: None ADL Screening (condition at time of admission) Patient's cognitive ability adequate to safely complete daily activities?: Yes Is the patient  deaf or have difficulty hearing?: No Does the patient have difficulty seeing, even when wearing glasses/contacts?: No Does the patient have difficulty concentrating, remembering, or making decisions?: No Patient able to express need for assistance with ADLs?: Yes Does the patient have difficulty dressing or bathing?: No Independently performs ADLs?: Yes (appropriate for developmental age) Does the patient have difficulty walking or climbing stairs?: No Weakness of Legs: None Weakness of Arms/Hands: Right  Permission Sought/Granted Permission sought to share information with : Facility Industrial/product designer granted to share information with : Yes, Verbal Permission Granted           Permission granted to share info w Contact Information: Hope (aunt)  Emotional Assessment         Alcohol / Substance Use: Not Applicable Psych Involvement: No (comment)  Admission diagnosis:  Small bowel obstruction (HCC) [K56.609] Generalized abdominal pain [R10.84] Patient Active Problem List   Diagnosis Date Noted  . Small bowel obstruction (HCC) 05/13/2020  . Schizophrenia, paranoid, chronic (HCC)   . DIABETES MELLITUS 05/03/2007  . SCHIZOPHRENIA 05/03/2007  . HYPERTENSION 05/03/2007  . PNEUMONITIS DUE TO FOOD/VOMIT INHALATION 05/03/2007  . PECTUS EXCAVATUM 05/03/2007   PCP:  Michaela Corner, MD (Inactive) Pharmacy:   [facility] [address] [place], [place] - [address] [place] [address] Phone: [phone] Fax: [phone]     Social Determinants of Health (SDOH) Interventions    Readmission Risk Interventions  No flowsheet data found.

## 2020-05-15 NOTE — Progress Notes (Addendum)
1200 ml of normal saline iv given in pacu

## 2020-05-15 NOTE — Transfer of Care (Signed)
Immediate Anesthesia Transfer of Care Note  Patient: Derrick Atkins  Procedure(s) Performed: EXPLORATORY LAPAROTOMY (N/A )  Patient Location: PACU  Anesthesia Type:General  Level of Consciousness: awake  Airway & Oxygen Therapy: Patient Spontanous Breathing and Patient connected to face mask oxygen  Post-op Assessment: Report given to RN and Post -op Vital signs reviewed and stable  Post vital signs: Reviewed and stable  Last Vitals:  Vitals Value Taken Time  BP 115/44 05/15/20 1428  Temp 36.6 C 05/15/20 1433  Pulse 104 05/15/20 1433  Resp 19 05/15/20 1433  SpO2 97 % 05/15/20 1433  Vitals shown include unvalidated device data.  Last Pain:  Vitals:   05/15/20 0920  TempSrc: Oral  PainSc: 3          Complications: No complications documented.

## 2020-05-15 NOTE — Consult Note (Signed)
PHARMACY - TOTAL PARENTERAL NUTRITION CONSULT NOTE   Indication: Prolonged ileus  Patient Measurements: Height: 6\' 4"  (193 cm) Weight: 116.6 kg (257 lb 0.9 oz) IBW/kg (Calculated) : 86.8 TPN AdjBW (KG): 94.2 Body mass index is 31.29 kg/m.  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  Glucose / Insulin: A1C 5.7, SSI  BG previous 24h 103 - 139 requiring 9 units SSI Electrolytes: hypokalemic, other electrolytes WNL Renal: stable at apparent baseline LFTs / TGs: LFTs wnl Prealbumin / albumin: pending / 3.9 g/dL  Intake / Output; MIVF: D5LR@100mL /hr 11/03 0701 - 11/04 0700 In: 3032.9 [I.V.:3032.9] Out: 1200 [Urine:450; Emesis/NG output:750]  GI Imaging:  11/3 KUB: Persistent small-bowel obstruction  11/4 KUB: Stable to worsened moderate to marked diffuse small bowel dilatation with air-fluid levels, compatible with persistent distal small bowel obstruction Surgeries / Procedures: none  Central access: 05/15/20 (pending) TPN start date: 05/15/20 (pending PICC placement)  Nutritional Goals  (per RD recommendation on 05/15/20): kCal: 2600 - 2900/day, Protein: 130 - 145g/day, Fluid: 2800 - 3100 mL  Current Nutrition:  NPO  Plan:   Start TPN at 50 mL/hr at 1800 (total volume 13/04/21)  Amino Acids: 56 g/L (67.2g)  Dextrose: 15.5 % (186g)  Lipids: 32 g/L (38.4g)  Total calories: 1285 kCal  Electrolytes in TPN (standard): 20mEq/L of Na, 41mEq/L of K, 24mEq/L of Ca, 46mEq/L of Mg, and 82mmol/L of Phos. Cl:Ac ratio 1:1  Add standard MVI and trace elements to TPN  continue Resistant q4h SSI and adjust as needed   Reduce MIVF to 50 mL/hr at 1800  Monitor TPN labs on Mon/Thurs, and daily until stable  12m 05/15/2020,10:24 AM

## 2020-05-16 ENCOUNTER — Encounter: Payer: Self-pay | Admitting: Surgery

## 2020-05-16 LAB — CBC
HCT: 36.4 % — ABNORMAL LOW (ref 39.0–52.0)
Hemoglobin: 11.7 g/dL — ABNORMAL LOW (ref 13.0–17.0)
MCH: 28.1 pg (ref 26.0–34.0)
MCHC: 32.1 g/dL (ref 30.0–36.0)
MCV: 87.5 fL (ref 80.0–100.0)
Platelets: 129 10*3/uL — ABNORMAL LOW (ref 150–400)
RBC: 4.16 MIL/uL — ABNORMAL LOW (ref 4.22–5.81)
RDW: 14.1 % (ref 11.5–15.5)
WBC: 3 10*3/uL — ABNORMAL LOW (ref 4.0–10.5)
nRBC: 0 % (ref 0.0–0.2)

## 2020-05-16 LAB — DIFFERENTIAL
Abs Immature Granulocytes: 0.02 10*3/uL (ref 0.00–0.07)
Basophils Absolute: 0 10*3/uL (ref 0.0–0.1)
Basophils Relative: 1 %
Eosinophils Absolute: 0.1 10*3/uL (ref 0.0–0.5)
Eosinophils Relative: 2 %
Immature Granulocytes: 1 %
Lymphocytes Relative: 23 %
Lymphs Abs: 0.7 10*3/uL (ref 0.7–4.0)
Monocytes Absolute: 0.4 10*3/uL (ref 0.1–1.0)
Monocytes Relative: 14 %
Neutro Abs: 1.8 10*3/uL (ref 1.7–7.7)
Neutrophils Relative %: 59 %
Smear Review: NORMAL
WBC Morphology: INCREASED

## 2020-05-16 LAB — GLUCOSE, CAPILLARY
Glucose-Capillary: 119 mg/dL — ABNORMAL HIGH (ref 70–99)
Glucose-Capillary: 127 mg/dL — ABNORMAL HIGH (ref 70–99)
Glucose-Capillary: 133 mg/dL — ABNORMAL HIGH (ref 70–99)
Glucose-Capillary: 133 mg/dL — ABNORMAL HIGH (ref 70–99)
Glucose-Capillary: 144 mg/dL — ABNORMAL HIGH (ref 70–99)
Glucose-Capillary: 150 mg/dL — ABNORMAL HIGH (ref 70–99)

## 2020-05-16 LAB — COMPREHENSIVE METABOLIC PANEL
ALT: 12 U/L (ref 0–44)
AST: 17 U/L (ref 15–41)
Albumin: 2.3 g/dL — ABNORMAL LOW (ref 3.5–5.0)
Alkaline Phosphatase: 30 U/L — ABNORMAL LOW (ref 38–126)
Anion gap: 9 (ref 5–15)
BUN: 36 mg/dL — ABNORMAL HIGH (ref 6–20)
CO2: 27 mmol/L (ref 22–32)
Calcium: 7.6 mg/dL — ABNORMAL LOW (ref 8.9–10.3)
Chloride: 100 mmol/L (ref 98–111)
Creatinine, Ser: 1.2 mg/dL (ref 0.61–1.24)
GFR, Estimated: >60 mL/min (ref >60)
Glucose, Bld: 146 mg/dL — ABNORMAL HIGH (ref 70–99)
Potassium: 4 mmol/L (ref 3.5–5.1)
Sodium: 136 mmol/L (ref 135–145)
Total Bilirubin: 0.7 mg/dL (ref 0.3–1.2)
Total Protein: 4.5 g/dL — ABNORMAL LOW (ref 6.5–8.1)

## 2020-05-16 LAB — MAGNESIUM: Magnesium: 2.1 mg/dL (ref 1.7–2.4)

## 2020-05-16 LAB — PREALBUMIN: Prealbumin: 6.9 mg/dL — ABNORMAL LOW (ref 18–38)

## 2020-05-16 LAB — TRIGLYCERIDES: Triglycerides: 120 mg/dL (ref <150)

## 2020-05-16 LAB — PHOSPHORUS: Phosphorus: 3.3 mg/dL (ref 2.5–4.6)

## 2020-05-16 MED ORDER — SODIUM CHLORIDE 0.9 % IV SOLN: INTRAVENOUS | Status: AC

## 2020-05-16 MED ORDER — LACTATED RINGERS IV SOLN: INTRAVENOUS | Status: DC

## 2020-05-16 MED ORDER — TRAVASOL 10 % IV SOLN
INTRAVENOUS | Status: AC
  Filled 2020-05-16: qty 1411.2

## 2020-05-16 NOTE — Evaluation (Addendum)
Physical Therapy Evaluation Patient Details Name: Derrick Atkins MRN: [id number] DOB: 1971-02-28 Today's Date: 05/16/2020   History of Present Illness  Pt is a 49 y/o M admitted on 05/13/20 with 3 days of N/V & constipation after eating raw peanut shells. CT concerning for small bowel obstruction. Pt underwent exploratory laparotomy on 05/15/20. PMH: schizoaffective disorder, CP, DM, HTN, hypothyroidism, depression, scoliosis  Clinical Impression  Pt agreeable to tx with PT educating pt on log rolling to protect abdominal incision & to limit pulling/pain with movement but pt with great difficulty rolling L despite max assist from PT & hospital bed features. PT attempts to assist pt with uprighting trunk to sit EOB but pt with too much pain in abdomen so returned supine. Pt performs BLE strengthening exercises: hip adduction squeezes & heel slides with encouragement to perform these throughout the day. Pt then declines further participation 2/2 pain & fatigue. Pt would benefit from acute PT services to address strength & increase independence with mobility. At this time recommending SNF level of care at d/c.     Follow Up Recommendations SNF    Equipment Recommendations   (TBD in next venue)    Recommendations for Other Services       Precautions / Restrictions Precautions Precautions: Fall Precaution Comments: log rolling to protect abdominal incision Restrictions Weight Bearing Restrictions: No      Mobility  Bed Mobility Overal bed mobility: Needs Assistance Bed Mobility: Supine to Sit     Supine to sit: Max assist;HOB elevated          Transfers                    Ambulation/Gait                Stairs            Wheelchair Mobility    Modified Rankin (Stroke Patients Only)       Balance                                             Pertinent Vitals/Pain Pain Assessment: 0-10 Pain Score: 8  Pain Location:  abdomen Pain Descriptors / Indicators: Discomfort;Sore;Guarding Pain Intervention(s): Limited activity within patient's tolerance;Patient requesting pain meds-RN notified;Repositioned;Monitored during session    Home Living Family/patient expects to be discharged to:: Assisted living Living Arrangements: Alone             Home Equipment: None      Prior Function Level of Independence: Independent (without AD)               Hand Dominance        Extremity/Trunk Assessment   Upper Extremity Assessment Upper Extremity Assessment: Generalized weakness (hx of CP with resultant RUE weakness/impaired neuromuscular control)    Lower Extremity Assessment Lower Extremity Assessment: Generalized weakness (hx of CP with resultant RLE weakness/impaired neuromuscular control)       Communication      Cognition Arousal/Alertness: Awake/alert Behavior During Therapy: WFL for tasks assessed/performed Overall Cognitive Status: No family/caregiver present to determine baseline cognitive functioning                                        General Comments General comments (skin integrity, edema, etc.): mobility limited  by pain    Exercises     Assessment/Plan    PT Assessment Patient needs continued PT services  PT Problem List Decreased activity tolerance;Decreased mobility;Decreased strength;Decreased balance;Pain;Decreased knowledge of precautions       PT Treatment Interventions DME instruction;Balance training;Patient/family education;Functional mobility training;Gait training;Neuromuscular re-education;Therapeutic activities;Stair training;Therapeutic exercise;Cognitive remediation;Manual techniques    PT Goals (Current goals can be found in the Care Plan section)  Acute Rehab PT Goals Patient Stated Goal: decreased pain Time For Goal Achievement: 05/30/20 Potential to Achieve Goals: Good    Frequency Min 2X/week   Barriers to discharge  Decreased caregiver support      Co-evaluation               AM-PAC PT "6 Clicks" Mobility  Outcome Measure Help needed turning from your back to your side while in a flat bed without using bedrails?: A Lot Help needed moving from lying on your back to sitting on the side of a flat bed without using bedrails?: A Lot Help needed moving to and from a bed to a chair (including a wheelchair)?: Total Help needed standing up from a chair using your arms (e.g., wheelchair or bedside chair)?: Total Help needed to walk in hospital room?: Total Help needed climbing 3-5 steps with a railing? : Total 6 Click Score: 8    End of Session   Activity Tolerance: Patient limited by pain Patient left: in bed;with bed alarm set;with call bell/phone within reach Nurse Communication: Patient requests pain meds PT Visit Diagnosis: Muscle weakness (generalized) (M62.81);Pain Pain - Right/Left:  (abdomen)    Time: 8338-2505 PT Time Calculation (min) (ACUTE ONLY): 22 min   Charges:   PT Evaluation $PT Eval Moderate Complexity: 1 Mod PT Treatments $Therapeutic Activity: 8-22 mins        Aleda Grana, PT, DPT 05/16/20, 4:00 PM   Sandi Mariscal 05/16/2020, 3:58 PM

## 2020-05-16 NOTE — Progress Notes (Signed)
[facility] SURGICAL PROGRESS NOTE  Hospital Day(s): 3.   Post op day(s): 1 Day Post-Op.   Interval History:  Patient seen and examined no acute events or new complaints overnight.  Patient reports he is doing well. He does have expected abdominal soreness, worse with movements, improved with pain regimen.  No fever, chills, nausea, emesis Leukopenia with some improvement, up to 3.0K Renal function normal but trending up, sCr - 1.20, BUN 36; UO measured at 670 ccs No electrolyte derangements  NGT output recorded at 1.2L PICC line placed yesterday afternoon, plan for TPN Still awaiting bowel function Has not mobilized   Vital signs in last 24 hours: [min-max] current  Temp:  [97.6 F (36.4 C)-98.8 F (37.1 C)] 98.4 F (36.9 C) (11/05 0341) Pulse Rate:  [86-105] 91 (11/05 0341) Resp:  [13-23] 18 (11/05 0341) BP: (109-140)/(41-64) 120/61 (11/05 0341) SpO2:  [84 %-98 %] 93 % (11/05 0341) Weight:  [116.6 kg] 116.6 kg (11/04 0920)     Height: 6\' 4"  (193 cm) Weight: 116.6 kg BMI (Calculated): 31.3   Intake/Output last 2 shifts:  11/04 0701 - 11/05 0700 In: 3875.7 [I.V.:3115.7; NG/GT:60; IV Piggyback:700] Out: 3495 [Urine:670; Emesis/NG output:1125; Blood:150]   Physical Exam:  Constitutional: alert, cooperative and no distress  HEENT: NGT in place Respiratory: breathing non-labored at rest Cardiovascular: regular rate and sinus rhythm  Gastrointestinal: Soft, incisional soreness, non-distended, no rebound/guarding Genitourinary: Foley in place Integumentary: Laparotomy incision is CDI, some early ecchymosis, no drainage or erythema  Labs:  CBC Latest Ref Rng & Units 05/15/2020 05/14/2020 05/13/2020  WBC 4.0 - 10.5 K/uL 2.4(L) 2.5(L) 8.4  Hemoglobin 13.0 - 17.0 g/dL 11.5(L) 13.6 14.8  Hematocrit 39 - 52 % 33.9(L) 39.9 43.6  Platelets 150 - 400 K/uL 109(L) 114(L) 216   CMP Latest Ref Rng & Units 05/15/2020 05/14/2020 05/13/2020  Glucose 70 - 99 mg/dL 13/08/2019)  035(K) 093(G)  BUN 6 - 20 mg/dL 182(X) 93(Z) 16(R)  Creatinine 0.61 - 1.24 mg/dL 67(E 9.38 1.01  Sodium 135 - 145 mmol/L 135 136 134(L)  Potassium 3.5 - 5.1 mmol/L 3.4(L) 3.7 4.5  Chloride 98 - 111 mmol/L 95(L) 97(L) 100  CO2 22 - 32 mmol/L 29 26 19(L)  Calcium 8.9 - 10.3 mg/dL 8.1(L) 8.6(L) 8.8(L)  Total Protein 6.5 - 8.1 g/dL - - 6.8  Total Bilirubin 0.3 - 1.2 mg/dL - - 1.1  Alkaline Phos 38 - 126 U/L - - 60  AST 15 - 41 U/L - - 24  ALT 0 - 44 U/L - - 16     Imaging studies: No new pertinent imaging studies   Assessment/Plan:  49 y.o. male doing well awaiting return of bowel function 1 Day Post-Op s/p exploratory laparotomy and lysis of adhesions for small bowel obstruction.   - Continue TPN; monitor electrolytes & advance to goal  - ContinueNGTdecompression to LIS; monitor and record output  - Discontinue foley catheter - Monitor abdominal examination; on-going bowel function - Pain control prn; antiemetics prn              - Medical management of comorbidities; hold home medications for now; added SSI - Mobilization encouraged as feasible; engage PT if needed - DVT prophylaxis  All of the above findings and recommendations were discussed with the patient, and the medical team, and all of patient's questions were answered to his expressed satisfaction.  -- 53, PA-C  [facility] 05/16/2020, 6:57 AM [phone] M-F: 7am - 4pm

## 2020-05-16 NOTE — Consult Note (Addendum)
PHARMACY - TOTAL PARENTERAL NUTRITION CONSULT NOTE   Indication: Prolonged ileus  Patient Measurements: Height: 6\' 4"  (193 cm) Weight: 116.6 kg (257 lb 0.9 oz) IBW/kg (Calculated) : 86.8 TPN AdjBW (KG): 94.2 Body mass index is 31.29 kg/m.  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  Glucose / Insulin: A1C 5.7, SSI  BG previous 24h 106 - 182 requiring 9 units SSI Electrolytes:  electrolytes WNL Renal: Scr 1.04> 1.20 LFTs / TGs: LFTs wnl  TG 120 Prealbumin / albumin: 6.9/2.3 Intake / Output; MIVF: LR@75mL /hr 11/03 0701 - 11/04 0700 In: 3032.9 [I.V.:3032.9] Out: 1200 [Urine:450; Emesis/NG output:750]  GI Imaging:  11/3 KUB: Persistent small-bowel obstruction  11/4 KUB: Stable to worsened moderate to marked diffuse small bowel dilatation with air-fluid levels, compatible with persistent distal small bowel obstruction Surgeries / Procedures: none  Central access: 05/15/20  TPN start date: 05/15/20 (pending PICC placement)  Nutritional Goals  (per RD recommendation on 05/15/20): kCal: 2600 - 2900/day, Protein: 130 - 145g/day, Fluid: 2800 - 3100 mL  Current Nutrition:  NPO  Plan:   Increase TPN to 105 mL/hr at 1800 (total volume 13/04/21)  Amino Acids-(Travasol 10%):  56 g/L (141g)  Dextrose: 15.5 % (390.6g)  Lipids(smoflipid 20%): 32 g/L (80.6g)  Total calories: 2698 kCal  Electrolytes in TPN (standard): 29mEq/L of Na, 46mEq/L of K, 60mEq/L of Ca, 70mEq/L of Mg, and 31mmol/L of Phos. Cl:Ac ratio 1:1  Add standard MVI and trace elements to TPN  continue Resistant q4h SSI and adjust as needed   Reduce LR to 20 mL/hr at 1800 fot total fluid rate of 125 ml/hr per discussion with surgery PA.  Monitor TPN labs  daily until stable then on Mon/Thurs   Derrick Atkins A 05/16/2020,1:26 PM

## 2020-05-16 NOTE — Care Management Important Message (Signed)
Important Message  Patient Details  Name: Derrick Atkins MRN: [id number] Date of Birth: 13-Sep-1970   Medicare Important Message Given:  N/A - LOS <3 / Initial given by admissions     Olegario Messier A Chenika Nevils 05/16/2020, 10:50 AM

## 2020-05-17 ENCOUNTER — Inpatient Hospital Stay: Payer: Medicare Other

## 2020-05-17 LAB — COMPREHENSIVE METABOLIC PANEL
ALT: 13 U/L (ref 0–44)
AST: 17 U/L (ref 15–41)
Albumin: 2.3 g/dL — ABNORMAL LOW (ref 3.5–5.0)
Alkaline Phosphatase: 34 U/L — ABNORMAL LOW (ref 38–126)
Anion gap: 8 (ref 5–15)
BUN: 37 mg/dL — ABNORMAL HIGH (ref 6–20)
CO2: 28 mmol/L (ref 22–32)
Calcium: 8 mg/dL — ABNORMAL LOW (ref 8.9–10.3)
Chloride: 100 mmol/L (ref 98–111)
Creatinine, Ser: 0.99 mg/dL (ref 0.61–1.24)
GFR, Estimated: >60 mL/min (ref >60)
Glucose, Bld: 165 mg/dL — ABNORMAL HIGH (ref 70–99)
Potassium: 3.8 mmol/L (ref 3.5–5.1)
Sodium: 136 mmol/L (ref 135–145)
Total Bilirubin: 0.5 mg/dL (ref 0.3–1.2)
Total Protein: 4.5 g/dL — ABNORMAL LOW (ref 6.5–8.1)

## 2020-05-17 LAB — GLUCOSE, CAPILLARY
Glucose-Capillary: 125 mg/dL — ABNORMAL HIGH (ref 70–99)
Glucose-Capillary: 137 mg/dL — ABNORMAL HIGH (ref 70–99)
Glucose-Capillary: 137 mg/dL — ABNORMAL HIGH (ref 70–99)
Glucose-Capillary: 150 mg/dL — ABNORMAL HIGH (ref 70–99)
Glucose-Capillary: 170 mg/dL — ABNORMAL HIGH (ref 70–99)
Glucose-Capillary: 176 mg/dL — ABNORMAL HIGH (ref 70–99)

## 2020-05-17 LAB — PHOSPHORUS: Phosphorus: 2.6 mg/dL (ref 2.5–4.6)

## 2020-05-17 LAB — MAGNESIUM: Magnesium: 2.3 mg/dL (ref 1.7–2.4)

## 2020-05-17 MED ORDER — TRAVASOL 10 % IV SOLN
INTRAVENOUS | Status: AC
  Filled 2020-05-17: qty 1411.2

## 2020-05-17 NOTE — Consult Note (Signed)
PHARMACY - TOTAL PARENTERAL NUTRITION CONSULT NOTE   Indication: Prolonged ileus  Patient Measurements: Height: 6\' 4"  (193 cm) Weight: 116.6 kg (257 lb 0.9 oz) IBW/kg (Calculated) : 86.8 TPN AdjBW (KG): 94.2 Body mass index is 31.29 kg/m.  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  Glucose / Insulin: A1C 5.7, SSI  BG previous 24h 119 - 137 requiring 15 units SSI Electrolytes:  electrolytes WNL Renal: Scr 1.04> 1.20 > 0.99 LFTs / TGs: LFTs wnl  TG 120 Prealbumin / albumin: 6.9/2.3 Intake / Output; MIVF: LR@20mL /hr + TPN @ 105 11/05 0701 - 11/06 0700 In: 2542 [I.V.:2542] Out: 1425 [Urine:1075; Emesis/NG output:350]  GI Imaging:  11/3 KUB: Persistent small-bowel obstruction  11/4 KUB: Stable to worsened moderate to marked diffuse small bowel dilatation with air-fluid levels, compatible with persistent distal small bowel obstruction Surgeries / Procedures: none  Central access: 05/15/20  TPN start date: 05/15/20 (pending PICC placement)  Nutritional Goals  (per RD recommendation on 05/15/20): kCal: 2600 - 2900/day, Protein: 130 - 145g/day, Fluid: 2800 - 3100 mL  Current Nutrition:  NPO  Plan:   Continue TPN @ 105 mL/hr at 1800 (total volume 13/04/21)  Amino Acids-(Travasol 10%):  56 g/L (141g)  Dextrose: 15.5 % (390.6g)  Lipids(smoflipid 20%): 32 g/L (80.6g)  Total calories: 2698 kCal  Electrolytes in TPN (standard): 66mEq/L of Na, 49mEq/L of K, 48mEq/L of Ca, 81mEq/L of Mg, and 51mmol/L of Phos. Cl:Ac ratio 1:1  Add standard MVI and trace elements to TPN  continue Resistant q4h SSI and adjust as needed   Reduce LR to 20 mL/hr at 1800 fot total fluid rate of 125 ml/hr per discussion with surgery  PA.  Monitor TPN labs daily until stable then on Mon/Thurs  Monitor UOP (1L 11/6)   13/6, PharmD, BCPS Clinical Pharmacist 05/17/2020 8:09 AM

## 2020-05-17 NOTE — Progress Notes (Signed)
CC: SBO Subjective: POD # 2, LOA Passing flatus KUB pers. reviewed showing some persistent bowel dilation NGT 350cc Labs ok w mild leukopenia  Objective: Vital signs in last 24 hours: Temp:  [97.5 F (36.4 C)-98.9 F (37.2 C)] 98.9 F (37.2 C) (11/06 1203) Pulse Rate:  [86-98] 86 (11/06 1203) Resp:  [18-20] 18 (11/06 1203) BP: (125-147)/(55-61) 147/56 (11/06 1203) SpO2:  [92 %-94 %] 92 % (11/06 1203) Last BM Date: 05/10/20  Intake/Output from previous day: 11/05 0701 - 11/06 0700 In: 2542.7 [I.V.:2522.7; NG/GT:20] Out: 1425 [Urine:1075; Emesis/NG output:350] Intake/Output this shift: Total I/O In: -  Out: 150 [Urine:150]  Physical exam:  NAD, alert Abd: soft, mild appropriate tenderness, no peritonitis, no infection   Lab Results: CBC  Recent Labs    05/15/20 0434 05/16/20 0655  WBC 2.4* 3.0*  HGB 11.5* 11.7*  HCT 33.9* 36.4*  PLT 109* 129*   BMET Recent Labs    05/16/20 0655 05/17/20 0552  NA 136 136  K 4.0 3.8  CL 100 100  CO2 27 28  GLUCOSE 146* 165*  BUN 36* 37*  CREATININE 1.20 0.99  CALCIUM 7.6* 8.0*   PT/INR No results for input(s): LABPROT, INR in the last 72 hours. ABG No results for input(s): PHART, HCO3 in the last 72 hours.  Invalid input(s): PCO2, PO2  Studies/Results: No results found.  Anti-infectives: Anti-infectives (From admission, onward)   Start     Dose/Rate Route Frequency Ordered Stop   05/15/20 1013  sodium chloride 0.9 % with cefoTEtan (CEFOTAN) ADS Med       Note to Pharmacy: Judee Clara   : cabinet override      05/15/20 1013 05/15/20 1019   05/15/20 1000  cefoTEtan (CEFOTAN) 2 g in sodium chloride 0.9 % 100 mL IVPB        2 g 200 mL/hr over 30 Minutes Intravenous On call to O.R. 05/15/20 0902 05/15/20 1031      Assessment/Plan: Expected post op ileus Keep NGT for now Mobilize Labs in am  Sterling Big, MD, [facility]  05/17/2020

## 2020-05-18 ENCOUNTER — Inpatient Hospital Stay: Payer: Medicare Other

## 2020-05-18 LAB — COMPREHENSIVE METABOLIC PANEL
ALT: 11 U/L (ref 0–44)
AST: 14 U/L — ABNORMAL LOW (ref 15–41)
Albumin: 2.3 g/dL — ABNORMAL LOW (ref 3.5–5.0)
Alkaline Phosphatase: 39 U/L (ref 38–126)
Anion gap: 7 (ref 5–15)
BUN: 40 mg/dL — ABNORMAL HIGH (ref 6–20)
CO2: 27 mmol/L (ref 22–32)
Calcium: 8.2 mg/dL — ABNORMAL LOW (ref 8.9–10.3)
Chloride: 104 mmol/L (ref 98–111)
Creatinine, Ser: 0.96 mg/dL (ref 0.61–1.24)
GFR, Estimated: >60 mL/min (ref >60)
Glucose, Bld: 197 mg/dL — ABNORMAL HIGH (ref 70–99)
Potassium: 4 mmol/L (ref 3.5–5.1)
Sodium: 138 mmol/L (ref 135–145)
Total Bilirubin: 0.5 mg/dL (ref 0.3–1.2)
Total Protein: 4.8 g/dL — ABNORMAL LOW (ref 6.5–8.1)

## 2020-05-18 LAB — PHOSPHORUS: Phosphorus: 3.1 mg/dL (ref 2.5–4.6)

## 2020-05-18 LAB — GLUCOSE, CAPILLARY
Glucose-Capillary: 109 mg/dL — ABNORMAL HIGH (ref 70–99)
Glucose-Capillary: 158 mg/dL — ABNORMAL HIGH (ref 70–99)
Glucose-Capillary: 163 mg/dL — ABNORMAL HIGH (ref 70–99)
Glucose-Capillary: 166 mg/dL — ABNORMAL HIGH (ref 70–99)
Glucose-Capillary: 170 mg/dL — ABNORMAL HIGH (ref 70–99)
Glucose-Capillary: 174 mg/dL — ABNORMAL HIGH (ref 70–99)
Glucose-Capillary: 188 mg/dL — ABNORMAL HIGH (ref 70–99)
Glucose-Capillary: 201 mg/dL — ABNORMAL HIGH (ref 70–99)

## 2020-05-18 LAB — CBC
HCT: 30.6 % — ABNORMAL LOW (ref 39.0–52.0)
Hemoglobin: 10.2 g/dL — ABNORMAL LOW (ref 13.0–17.0)
MCH: 29.1 pg (ref 26.0–34.0)
MCHC: 33.3 g/dL (ref 30.0–36.0)
MCV: 87.4 fL (ref 80.0–100.0)
Platelets: 147 10*3/uL — ABNORMAL LOW (ref 150–400)
RBC: 3.5 MIL/uL — ABNORMAL LOW (ref 4.22–5.81)
RDW: 13.8 % (ref 11.5–15.5)
WBC: 4 10*3/uL (ref 4.0–10.5)
nRBC: 0 % (ref 0.0–0.2)

## 2020-05-18 LAB — MAGNESIUM: Magnesium: 2.3 mg/dL (ref 1.7–2.4)

## 2020-05-18 MED ORDER — TRAVASOL 10 % IV SOLN
INTRAVENOUS | Status: AC
  Filled 2020-05-18: qty 1411.2

## 2020-05-18 MED ORDER — FLEET ENEMA 7-19 GM/118ML RE ENEM
1.0000 | ENEMA | Freq: Once | RECTAL | Status: AC
  Administered 2020-05-18: 1 via RECTAL

## 2020-05-18 NOTE — Progress Notes (Signed)
CC: SBO Subjective: Feeling better, he is passing flatus. NG tube 600 cc. Overall feels better. KUB personally reviewed and there is still dilation of the bowel but there is gas in the colon. NG tube is still very thick  Objective: Vital signs in last 24 hours: Temp:  [97.4 F (36.3 C)-98.4 F (36.9 C)] 98.2 F (36.8 C) (11/07 1612) Pulse Rate:  [80-91] 80 (11/07 1612) Resp:  [16-18] 18 (11/07 1612) BP: (144-164)/(53-87) 156/72 (11/07 1612) SpO2:  [94 %-96 %] 94 % (11/07 1612) Last BM Date: 05/10/20  Intake/Output from previous day: 11/06 0701 - 11/07 0700 In: 1205.6 [I.V.:1205.6] Out: 1400 [Urine:800; Emesis/NG output:600] Intake/Output this shift: No intake/output data recorded.  Physical exam:  NAD Abd: Incision c/d/i, mild distension , decrease BS, no infection or peritonitis  Lab Results: CBC  Recent Labs    05/16/20 0655 05/18/20 0556  WBC 3.0* 4.0  HGB 11.7* 10.2*  HCT 36.4* 30.6*  PLT 129* 147*   BMET Recent Labs    05/17/20 0552 05/18/20 0556  NA 136 138  K 3.8 4.0  CL 100 104  CO2 28 27  GLUCOSE 165* 197*  BUN 37* 40*  CREATININE 0.99 0.96  CALCIUM 8.0* 8.2*   PT/INR No results for input(s): LABPROT, INR in the last 72 hours. ABG No results for input(s): PHART, HCO3 in the last 72 hours.  Invalid input(s): PCO2, PO2  Studies/Results: DG Abd Portable 2V  Result Date: 05/18/2020 CLINICAL DATA:  Abdominal pain, exploratory laparotomy 05/15/2020 EXAM: PORTABLE ABDOMEN - 2 VIEW COMPARISON:  Abdominal radiograph from 1 day prior FINDINGS: Enteric tube terminates in the proximal stomach. Moderate diffuse small bowel dilatation throughout the abdomen, not substantially changed. Moderate left colonic gas and stool. No evidence of pneumatosis or pneumoperitoneum no radiopaque nephrolithiasis. Bibasilar lung opacities appear similar. IMPRESSION: 1. Enteric tube terminates in the proximal stomach. 2. Moderate diffuse small bowel dilatation, not  substantially changed, indeterminate for adynamic ileus versus distal small bowel obstruction. 3. Moderate left colonic gas and stool. Electronically Signed   By: Delbert Phenix M.D.   On: 05/18/2020 15:29   DG Abd Portable 2V  Result Date: 05/17/2020 CLINICAL DATA:  Abdominal pain.  Ileus.  Recent laparotomy. EXAM: PORTABLE ABDOMEN - 2 VIEW COMPARISON:  Radiograph 05/15/2020 FINDINGS: Tip and side port of the enteric tube below the diaphragm in the stomach. There is no abnormal gastric distension. Diffusely dilated air-filled loops of small bowel, similar in appearance to prior exam. Air and small amount of stool in the left colon. Streaky atelectasis at the right lung base. IMPRESSION: 1. Tip and side port of the enteric tube below the diaphragm in the stomach. 2. Unchanged diffuse gaseous small bowel dilatation, postoperative ileus versus recurrent obstruction Electronically Signed   By: Narda Rutherford M.D.   On: 05/17/2020 16:24    Anti-infectives: Anti-infectives (From admission, onward)   Start     Dose/Rate Route Frequency Ordered Stop   05/15/20 1013  sodium chloride 0.9 % with cefoTEtan (CEFOTAN) ADS Med       Note to Pharmacy: Judee Clara   : cabinet override      05/15/20 1013 05/15/20 1019   05/15/20 1000  cefoTEtan (CEFOTAN) 2 g in sodium chloride 0.9 % 100 mL IVPB        2 g 200 mL/hr over 30 Minutes Intravenous On call to O.R. 05/15/20 0902 05/15/20 1031      Assessment/Plan:  Postoperative ileus. Still in the gray area. We will try  to do some clamping trial. I will also write for a fleets enema since his got some retained stool. I'm not necessarily certain that he will pass NG tube clamping trial  Sterling Big, MD, FACS  05/18/2020

## 2020-05-18 NOTE — Progress Notes (Signed)
-  Patient tolerated the clamping of NG suction. Denied nausea, vomiting. - Frequently passing gas - Enema administered, patient had large BM on bedside commode.

## 2020-05-18 NOTE — Consult Note (Signed)
PHARMACY - TOTAL PARENTERAL NUTRITION CONSULT NOTE   Indication: Prolonged ileus  Patient Measurements: Height: 6\' 4"  (193 cm) Weight: 116.6 kg (257 lb 0.9 oz) IBW/kg (Calculated) : 86.8 TPN AdjBW (KG): 94.2 Body mass index is 31.29 kg/m.  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  Glucose / Insulin: A1C 5.7, SSI  BG previous 24h 125 - 197 requiring 22 units SSI Electrolytes:  electrolytes WNL Renal: Scr 1.04> 1.20 > 0.99 > 0.96 LFTs / TGs: LFTs wnl  TG 120 Prealbumin / albumin: 6.9/2.3 Intake / Output; MIVF: LR@20mL /hr + TPN @ 105 11/05 0701 - 11/06 0700 In: 1205[I.V.:1205] Out: 800[Urine:800; Emesis/NG output:0  GI Imaging:  11/3 KUB: Persistent small-bowel obstruction  11/4 KUB: Stable to worsened moderate to marked diffuse small bowel dilatation with air-fluid levels, compatible with persistent distal small bowel obstruction Surgeries / Procedures: none  Central access: 05/15/20  TPN start date: 05/15/20 (pending PICC placement)  Nutritional Goals  (per RD recommendation on 05/15/20): kCal: 2600 - 2900/day, Protein: 130 - 145g/day, Fluid: 2800 - 3100 mL  Current Nutrition:  NPO  Plan:   Continue TPN @ 105 mL/hr at 1800 (total volume 13/04/21)  Amino Acids-(Travasol 10%):  56 g/L (141g)  Dextrose: 15.5 % (390.6g)  Lipids(smoflipid 20%): 32 g/L (80.6g)  Total calories: 2698 kCal  Electrolytes in TPN (standard): 29mEq/L of Na, 51mEq/L of K, 37mEq/L of Ca, 67mEq/L of Mg, and 28mmol/L of Phos. Cl:Ac ratio 1:1  Add standard MVI and trace elements to TPN  continue Resistant q4h SSI and adjust as needed   Reduce LR to 20 mL/hr at 1800 fot total fluid rate of 125 ml/hr per discussion with surgery  PA.  Monitor TPN labs daily until stable then on Mon/Thurs  Monitor UOP (<1L 11/7)   13/7, PharmD, BCPS Clinical Pharmacist 05/18/2020 7:06 AM

## 2020-05-19 ENCOUNTER — Inpatient Hospital Stay: Payer: Medicare Other

## 2020-05-19 LAB — CBC
HCT: 31.6 % — ABNORMAL LOW (ref 39.0–52.0)
Hemoglobin: 10.3 g/dL — ABNORMAL LOW (ref 13.0–17.0)
MCH: 28.5 pg (ref 26.0–34.0)
MCHC: 32.6 g/dL (ref 30.0–36.0)
MCV: 87.3 fL (ref 80.0–100.0)
Platelets: 163 10*3/uL (ref 150–400)
RBC: 3.62 MIL/uL — ABNORMAL LOW (ref 4.22–5.81)
RDW: 13.8 % (ref 11.5–15.5)
WBC: 4.9 10*3/uL (ref 4.0–10.5)
nRBC: 0 % (ref 0.0–0.2)

## 2020-05-19 LAB — MAGNESIUM: Magnesium: 2.2 mg/dL (ref 1.7–2.4)

## 2020-05-19 LAB — DIFFERENTIAL
Abs Immature Granulocytes: 0.24 10*3/uL — ABNORMAL HIGH (ref 0.00–0.07)
Basophils Absolute: 0 10*3/uL (ref 0.0–0.1)
Basophils Relative: 1 %
Eosinophils Absolute: 0.1 10*3/uL (ref 0.0–0.5)
Eosinophils Relative: 3 %
Immature Granulocytes: 5 %
Lymphocytes Relative: 21 %
Lymphs Abs: 1 10*3/uL (ref 0.7–4.0)
Monocytes Absolute: 0.7 10*3/uL (ref 0.1–1.0)
Monocytes Relative: 15 %
Neutro Abs: 2.7 10*3/uL (ref 1.7–7.7)
Neutrophils Relative %: 55 %
Smear Review: NORMAL

## 2020-05-19 LAB — COMPREHENSIVE METABOLIC PANEL
ALT: 10 U/L (ref 0–44)
AST: 14 U/L — ABNORMAL LOW (ref 15–41)
Albumin: 2.4 g/dL — ABNORMAL LOW (ref 3.5–5.0)
Alkaline Phosphatase: 41 U/L (ref 38–126)
Anion gap: 9 (ref 5–15)
BUN: 37 mg/dL — ABNORMAL HIGH (ref 6–20)
CO2: 27 mmol/L (ref 22–32)
Calcium: 8.2 mg/dL — ABNORMAL LOW (ref 8.9–10.3)
Chloride: 103 mmol/L (ref 98–111)
Creatinine, Ser: 0.92 mg/dL (ref 0.61–1.24)
GFR, Estimated: >60 mL/min (ref >60)
Glucose, Bld: 186 mg/dL — ABNORMAL HIGH (ref 70–99)
Potassium: 4.1 mmol/L (ref 3.5–5.1)
Sodium: 139 mmol/L (ref 135–145)
Total Bilirubin: 0.5 mg/dL (ref 0.3–1.2)
Total Protein: 5 g/dL — ABNORMAL LOW (ref 6.5–8.1)

## 2020-05-19 LAB — GLUCOSE, CAPILLARY
Glucose-Capillary: 176 mg/dL — ABNORMAL HIGH (ref 70–99)
Glucose-Capillary: 184 mg/dL — ABNORMAL HIGH (ref 70–99)
Glucose-Capillary: 185 mg/dL — ABNORMAL HIGH (ref 70–99)
Glucose-Capillary: 193 mg/dL — ABNORMAL HIGH (ref 70–99)
Glucose-Capillary: 201 mg/dL — ABNORMAL HIGH (ref 70–99)
Glucose-Capillary: 210 mg/dL — ABNORMAL HIGH (ref 70–99)
Glucose-Capillary: 225 mg/dL — ABNORMAL HIGH (ref 70–99)

## 2020-05-19 LAB — PHOSPHORUS: Phosphorus: 4.4 mg/dL (ref 2.5–4.6)

## 2020-05-19 LAB — PREALBUMIN: Prealbumin: 7 mg/dL — ABNORMAL LOW (ref 18–38)

## 2020-05-19 LAB — TRIGLYCERIDES: Triglycerides: 201 mg/dL — ABNORMAL HIGH (ref <150)

## 2020-05-19 MED ORDER — BOOST / RESOURCE BREEZE PO LIQD CUSTOM
1.0000 | Freq: Three times a day (TID) | ORAL | Status: DC
  Administered 2020-05-19 (×3): 1 via ORAL

## 2020-05-19 MED ORDER — TRAVASOL 10 % IV SOLN
INTRAVENOUS | Status: AC
  Filled 2020-05-19: qty 1411.2

## 2020-05-19 MED ORDER — POLYETHYLENE GLYCOL 3350 17 G PO PACK
17.0000 g | PACK | Freq: Every day | ORAL | Status: DC
  Administered 2020-05-19 – 2020-05-23 (×5): 17 g via ORAL
  Filled 2020-05-19 (×5): qty 1

## 2020-05-19 MED ORDER — INFLUENZA VAC SPLIT QUAD 0.5 ML IM SUSY
0.5000 mL | PREFILLED_SYRINGE | INTRAMUSCULAR | Status: DC
  Filled 2020-05-19 (×2): qty 0.5

## 2020-05-19 MED ORDER — KETOROLAC TROMETHAMINE 30 MG/ML IJ SOLN
30.0000 mg | Freq: Four times a day (QID) | INTRAMUSCULAR | Status: AC | PRN
  Administered 2020-05-20: 13:00:00 30 mg via INTRAVENOUS
  Filled 2020-05-19: qty 1

## 2020-05-19 NOTE — Consult Note (Addendum)
PHARMACY - TOTAL PARENTERAL NUTRITION CONSULT NOTE   Indication: Prolonged ileus  Patient Measurements: Height: 6\' 4"  (193 cm) Weight: 116.6 kg (257 lb 0.9 oz) IBW/kg (Calculated) : 86.8 TPN AdjBW (KG): 94.2 Body mass index is 31.29 kg/m.  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  Glucose / Insulin: A1C 5.7, SSI  BG previous 24h 109 - 201 requiring 23 units SSI Electrolytes:  electrolytes WNL Renal: Scr 1.04> 1.20 > 0.99 > 0.96 LFTs / TGs: LFTs wnl  TG 120>201 Prealbumin / albumin: 6.9/2.4 Intake / Output; MIVF: LR@20mL /hr + TPN @ 105 11/05 0701 - 11/06 0700 In: ? Out: 525 + unmeasured stool LBM 11/7 GI Imaging:  11/3 KUB: Persistent small-bowel obstruction  11/4 KUB: Stable to worsened moderate to marked diffuse small bowel dilatation with air-fluid levels, compatible with persistent distal small bowel obstruction Surgeries / Procedures: none  Central access: 05/15/20  TPN start date: 05/15/20 (pending PICC placement)  Nutritional Goals  (per RD recommendation on 05/15/20): kCal: 2600 - 2900/day, Protein: 130 - 145g/day, Fluid: 2800 - 3100 mL  Current Nutrition:  NPO  Plan:   Continue TPN @ 105 mL/hr at 1800 (total volume 13/04/21)  Amino Acids-(Travasol 10%):  56 g/L (141g)  Dextrose: 15.5 % (390.6g)  Lipids(smoflipid 20%): 32 g/L (80.6g)  Total calories: 2698 kCal  Electrolytes in TPN (standard): 110mEq/L of Na, 72mEq/L of K, 74mEq/L of Ca, 79mEq/L of Mg, and 48mmol/L of Phos. Cl:Ac ratio 1:1  Add standard MVI and trace elements to TPN  continue Resistant q4h SSI and adjust as needed   Continue LR to 20 mL/hr at 1800 fot total fluid rate of 125 ml/hr   Monitor TPN labs on Mon/Thurs  Monitor UOP  (<1L 11/8)   Will monitor TG's (with slight bump)  13/8, PharmD, BCPS Clinical Pharmacist 05/19/2020 8:51 AM

## 2020-05-19 NOTE — Care Management Important Message (Signed)
Important Message  Patient Details  Name: Derrick Atkins MRN: [id number] Date of Birth: 1970-07-14   Medicare Important Message Given:  Yes     Olegario Messier A Makhayla Mcmurry 05/19/2020, 11:35 AM

## 2020-05-19 NOTE — Progress Notes (Addendum)
Physical Therapy Treatment Patient Details Name: Derrick Atkins MRN: [id number] DOB: September 03, 1970 Today's Date: 05/19/2020    History of Present Illness Pt is a 49 y/o M admitted on 05/13/20 with 3 days of N/V & constipation after eating raw peanut shells. CT concerning for small bowel obstruction. Pt underwent exploratory laparotomy on 05/15/20. PMH: schizoaffective disorder, CP, DM, HTN, hypothyroidism, depression, scoliosis    PT Comments    Pt is in far less pain on this date & motivated to participate. Pt initially requires min assist + 2 then min assist +1 for sit<>stand with cuing for safe hand placement & ambulate 140 ft + 140 ft with RW & min assist; after 1st lap HR = 97 bpm, SpO2 = 97%. Pt performs seated long arc quads 2 sets x 10 for strengthening. Will continue to follow pt acutely to progress gait with LRAD and for balance training.      Follow Up Recommendations  Supervision/Assistance - 24 hour;SNF     Equipment Recommendations    TBD in next venue   Recommendations for Other Services OT consult     Precautions / Restrictions Precautions Precautions: Fall Precaution Comments: log rolling to protect abdominal incision Restrictions Weight Bearing Restrictions: No    Mobility  Bed Mobility                  Transfers Overall transfer level: Needs assistance Equipment used: Rolling walker (2 wheeled) Transfers: Sit to/from Stand Sit to Stand: Min assist         General transfer comment: cuing to push up on recliner armrest  Ambulation/Gait Ambulation/Gait assistance: Min assist;+2 safety/equipment (min assist + chair follow for 1st lap, no chair follow for 2nd) Gait Distance (Feet): 140 Feet x 2 Assistive device: Rolling walker (2 wheeled) Gait Pattern/deviations: Decreased step length - left;Decreased step length - right;Decreased stride length;Decreased dorsiflexion - right;Decreased dorsiflexion - left Gait velocity: decreased   General Gait  Details: decreased knee extension in stance phase BLE, cuing to ambulate within base of AD   Stairs             Wheelchair Mobility    Modified Rankin (Stroke Patients Only)       Balance Overall balance assessment: Needs assistance;Mild deficits observed, not formally tested           Standing balance-Leahy Scale: Poor Standing balance comment: BUE support on RW in standing                            Cognition Arousal/Alertness: Awake/alert Behavior During Therapy: WFL for tasks assessed/performed Overall Cognitive Status: No family/caregiver present to determine baseline cognitive functioning                                 General Comments: hx of CP      Exercises      General Comments        Pertinent Vitals/Pain Pain Score: 3  (pt reports 3/10 which has improved from 7/10 earlier this morning) Pain Location: L shoulder Pain Descriptors / Indicators: Aching Pain Intervention(s): Monitored during session;Limited activity within patient's tolerance (pt reports nurse is aware)    Home Living                      Prior Function            PT Goals (  current goals can now be found in the care plan section) Acute Rehab PT Goals Patient Stated Goal: to get better Time For Goal Achievement: 05/30/20 Potential to Achieve Goals: Good Progress towards PT goals: Progressing toward goals    Frequency    Min 2X/week      PT Plan Discharge plan remains appropriate    Co-evaluation              AM-PAC PT "6 Clicks" Mobility   Outcome Measure  Help needed turning from your back to your side while in a flat bed without using bedrails?: A Little Help needed moving from lying on your back to sitting on the side of a flat bed without using bedrails?: A Little Help needed moving to and from a bed to a chair (including a wheelchair)?: A Little Help needed standing up from a chair using your arms (e.g., wheelchair or  bedside chair)?: A Little Help needed to walk in hospital room?: A Little Help needed climbing 3-5 steps with a railing? : A Lot 6 Click Score: 17    End of Session Equipment Utilized During Treatment: Gait belt Activity Tolerance: Patient tolerated treatment well Patient left: in chair;with call bell/phone within reach;with chair alarm set Nurse Communication: Mobility status PT Visit Diagnosis: Muscle weakness (generalized) (M62.81);Unsteadiness on feet (R26.81);Other abnormalities of gait and mobility (R26.89) Pain - Right/Left: Left Pain - part of body: Shoulder     Time: 6644-0347 PT Time Calculation (min) (ACUTE ONLY): 24 min  Charges:  $Therapeutic Activity: 23-37 mins                     Aleda Grana, PT, DPT 05/19/20, 1:30 PM    Sandi Mariscal 05/19/2020, 11:46 AM

## 2020-05-19 NOTE — Progress Notes (Signed)
   05/19/20 1030  Clinical Encounter Type  Visited With Patient  Visit Type Initial;Spiritual support;Social support  Referral From Nurse  Consult/Referral To Chaplain  ?Visited Pt per request from his mother. Pt is doing a lot better. Nurse came in so I cut the visit short. Ch will follow up.

## 2020-05-19 NOTE — Progress Notes (Cosign Needed Addendum)
RE:  Derrick Atkins. Lady Date of Birth: 09-15-1970 Date: 05/19/20   To Whom It May Concern:  Please be advised that the above-named patient will require a short-term nursing home stay - anticipated 30 days or less for rehabilitation and strengthening.  The plan is for return home.

## 2020-05-19 NOTE — Progress Notes (Signed)
Nutrition Follow-up  DOCUMENTATION CODES:   Obesity unspecified  INTERVENTION:  Continue TPN per pharmacy.  Continue Boost Breeze po TID, each supplement provides 250 kcal and 9 grams of protein.  If CBGs become too elevated related to Boost Breeze could also consider PROSource Plus po TID, each supplement provides 100 kcal and 15 grams of protein. PROSource Plus is also a clear liquid.  Once diet is advanced past clear liquids recommend Ensure Max Protein po TID, each supplement provides 150 kcal and 30 grams of protein.  NUTRITION DIAGNOSIS:   Inadequate oral intake related to altered GI function (SBO) as evidenced by NPO status.  Resolving - diet now advanced to clear liquids.  GOAL:   Patient will meet greater than or equal to 90% of their needs  Met with TPN.  MONITOR:   Diet advancement, Labs, Weight trends, Skin, I & O's, Other (Comment) (TPN)  REASON FOR ASSESSMENT:   Consult New TPN/TNA  ASSESSMENT:   49 y.o. 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 His NGT was removed yesterday and his diet was advanced to clears today. Patient reports he is tolerating clear liquids well and he finished most of his clear liquid lunch tray. Patient reports he enjoys the [employer] ordered today by surgery service. Patient reports he had a good appetite and intake up until approximately 3-4 days PTA, when he began to develop pain and N/V. Patient denies any post-prandial pain or N/V today. He had a large BM yesterday evening.   Patient denies having any weight loss PTA and reports he is weight-stable at around 260 lbs. Per last wt in chart patient was 116.6 kg (257.06 lbs). Of note he did weight 136.3 kg on 05/01/2019 per chart. This is a weight  loss of 19.7 kg (14.4% body weight) over approximately one year, which is not significant for time frame.   Medications reviewed and include: Novolog 0-20 units Q4hrs, levothyroxine, Protonix, Miralax.  Labs reviewed: CBG 109-210, BUN 37.  I/O: 450 mL UOP yesterday (0.2 mL/kg/hr)  Weight trend: 116.6 kg on 11/4 and no wt since then to trend  IV Access: left cephalic double lumen PICC placed 11/4  TPN regimen: Travasol 10% 56 grams/L, dextrose 15.5%, SMOFlipid 32 grams/L at 105 mL/hr (2698.5 kcal, 141 grams of protein)  Per chart plan is to continue goal TPN regimen for today.  NUTRITION - FOCUSED PHYSICAL EXAM:    Most Recent Value  Orbital Region No depletion  Upper Arm Region No depletion  Thoracic and Lumbar Region No depletion  Buccal Region No depletion  Temple Region No depletion  Clavicle Bone Region No depletion  Clavicle and Acromion Bone Region No depletion  Scapular Bone Region No depletion  Dorsal Hand No depletion  Patellar Region No depletion  Anterior Thigh Region No depletion  Posterior Calf Region No depletion  Edema (RD Assessment) None  Hair Reviewed  Eyes Reviewed  Mouth Reviewed  Skin Reviewed  Nails Reviewed     Diet Order:   Diet Order            Diet clear liquid Room service appropriate? Yes; Fluid consistency: Thin  Diet effective now                EDUCATION NEEDS:  Not appropriate for education at this time  Skin:  Skin Assessment: Skin Integrity Issues: Skin Integrity Issues:: Incisions Incisions: closed incision to abdomen  Last BM:  05/18/2020 - large type 3  Height:   Ht Readings from Last 1 Encounters:  05/15/20 $RemoveB'6\' 4"'JBfFErkp$  (1.93 m)   Weight:   Wt Readings from Last 1 Encounters:  05/15/20 116.6 kg   Ideal Body Weight:  91.8 kg  BMI:  Body mass index is 31.29 kg/m.  Estimated Nutritional Needs:   Kcal:  2600-2900kcal/day  Protein:  130-145g/day  Fluid:  2.8-3.1L/day  Jacklynn Barnacle, MS, RD, LDN Pager number  available on Amion

## 2020-05-19 NOTE — NC FL2 (Signed)
[place] MEDICAID FL2 LEVEL OF CARE SCREENING TOOL     IDENTIFICATION  Patient Name: Derrick Atkins Birthdate: 22-Jan-1971 Sex: male Admission Date (Current Location): 05/13/2020  [place] and [place] Number:  Chiropodist and Address:  [facility], [address], [place], [place] [address]      Provider Number: [id number]  Attending Physician Name and Address:  Henrene Dodge, MD  Relative Name and Phone Number:  Arlys John - Legal Guardian - [phone]    Current Level of Care: Hospital Recommended Level of Care: Skilled Nursing Facility Prior Approval Number:    Date Approved/Denied:   PASRR Number: pending  Discharge Plan: SNF    Current Diagnoses: Patient Active Problem List   Diagnosis Date Noted  . Small bowel obstruction (HCC) 05/13/2020  . Schizophrenia, paranoid, chronic (HCC)   . DIABETES MELLITUS 05/03/2007  . SCHIZOPHRENIA 05/03/2007  . HYPERTENSION 05/03/2007  . PNEUMONITIS DUE TO FOOD/VOMIT INHALATION 05/03/2007  . PECTUS EXCAVATUM 05/03/2007    Orientation RESPIRATION BLADDER Height & Weight     Self, Time, Situation, Place  Normal Incontinent Weight: 257 lb 0.9 oz (116.6 kg) Height:  6\' 4"  (193 cm)  BEHAVIORAL SYMPTOMS/MOOD NEUROLOGICAL BOWEL NUTRITION STATUS      Continent Diet (clear liquid diet)  AMBULATORY STATUS COMMUNICATION OF NEEDS Skin   Limited Assist Verbally Surgical wounds (surgical incision abdomen)                       Personal Care Assistance Level of Assistance  Bathing, Feeding, Dressing Bathing Assistance: Limited assistance Feeding assistance: Independent Dressing Assistance: Limited assistance     Functional Limitations Info             SPECIAL CARE FACTORS FREQUENCY  PT (By licensed PT), OT (By licensed OT)     PT Frequency: 5 x/week OT Frequency: 5 x/week            Contractures      Additional Factors Info  Code Status, Allergies Code Status Info: full  code Allergies Info: Latex, Prednisone           Current Medications (05/19/2020):  This is the current hospital active medication list Current Facility-Administered Medications  Medication Dose Route Frequency Provider Last Rate Last Admin  . ARIPiprazole (ABILIFY) tablet 20 mg  20 mg Oral Daily Piscoya, Jose, MD   20 mg at 05/19/20 0911  . buPROPion (WELLBUTRIN XL) 24 hr tablet 300 mg  300 mg Oral Daily Piscoya, Jose, MD   300 mg at 05/19/20 0910  . Chlorhexidine Gluconate Cloth 2 % PADS 6 each  6 each Topical Daily 13/08/21, MD   6 each at 05/19/20 1123  . divalproex (DEPAKOTE) DR tablet 1,000 mg  1,000 mg Oral BID 13/08/21, MD   1,000 mg at 05/19/20 0911  . enoxaparin (LOVENOX) injection 55 mg  55 mg Subcutaneous Q24H Piscoya, Jose, MD   55 mg at 05/18/20 2248  . feeding supplement (BOOST / RESOURCE BREEZE) liquid 1 Container  1 Container Oral TID BM 2249, PA-C   1 Container at 05/19/20 1400  . HYDROmorphone (DILAUDID) injection 0.5 mg  0.5 mg Intravenous Q4H PRN Piscoya, Jose, MD   0.5 mg at 05/19/20 1012  . [START ON 05/20/2020] influenza vac split quadrivalent PF (FLUARIX) injection 0.5 mL  0.5 mL Intramuscular Tomorrow-1000 Piscoya, Jose, MD      . insulin aspart (novoLOG) injection 0-20 Units  0-20 Units Subcutaneous Q4H  Henrene Dodge, MD   4 Units at 05/19/20 1357  . ketorolac (TORADOL) 30 MG/ML injection 30 mg  30 mg Intravenous Q6H PRN Piscoya, Jose, MD      . lactated ringers infusion   Intravenous Continuous Henrene Dodge, MD 20 mL/hr at 05/16/20 1831 Rate Verify at 05/16/20 1831  . levothyroxine (SYNTHROID) tablet 88 mcg  88 mcg Oral Q0600 Henrene Dodge, MD   88 mcg at 05/19/20 0653  . ondansetron (ZOFRAN-ODT) disintegrating tablet 4 mg  4 mg Oral Q6H PRN Piscoya, Jose, MD       Or  . ondansetron (ZOFRAN) injection 4 mg  4 mg Intravenous Q6H PRN Henrene Dodge, MD   4 mg at 05/14/20 2224  . pantoprazole (PROTONIX) injection 40 mg  40 mg Intravenous Q24H  Piscoya, Jose, MD   40 mg at 05/18/20 2247  . polyethylene glycol (MIRALAX / GLYCOLAX) packet 17 g  17 g Oral Daily Piscoya, Jose, MD   17 g at 05/19/20 1019  . sodium chloride flush (NS) 0.9 % injection 10-40 mL  10-40 mL Intracatheter Q12H Piscoya, Jose, MD   10 mL at 05/19/20 0913  . sodium chloride flush (NS) 0.9 % injection 10-40 mL  10-40 mL Intracatheter PRN Henrene Dodge, MD   10 mL at 05/18/20 0952  . TPN ADULT (ION)   Intravenous Continuous TPN Albina Billet, [place] 105 mL/hr at 05/18/20 1909 New Bag at 05/18/20 1909  . TPN ADULT (ION)   Intravenous Continuous TPN Albina Billet, [facility]         Discharge Medications: Please see discharge summary for a list of discharge medications.  Relevant Imaging Results:  Relevant Lab Results:   Additional Information SS #: [id number]  Amadu Schlageter E Evaline Waltman, LCSW

## 2020-05-19 NOTE — TOC Progression Note (Signed)
Transition of Care [facility]) - Progression Note    Patient Details  Name: Derrick Atkins MRN: [id number] Date of Birth: [birth year]  Transition of [facility]) CM/SW Contact  [name], LCSW Phone Number: 05/19/2020, 3:04 PM  Clinical Narrative:   CSW informed by PT that patient needs SNF unless he can go home with Home Health and 24 hour supervision. CSW spoke with Legal Guardian [name] via phone. [name] reported patient does not have anyone who can provide 24 hour supervision in the home and that patient does not allow visitors into his home. She prefers SNF rehab. She reported she is considering long term placement after that and is aware that SW at SNF can help with this process. She denied SNF preference. CSW started PASRR and SNF work up.     Expected Discharge Plan: Home/Self Care Barriers to Discharge: Continued Medical Work up  Expected Discharge Plan and Services Expected Discharge Plan: Home/Self Care       Living arrangements for the past 2 months: Single Family Home                                       Social Determinants of Health (SDOH) Interventions    Readmission Risk Interventions No flowsheet data found.

## 2020-05-19 NOTE — Progress Notes (Signed)
[facility] SURGICAL PROGRESS NOTE  Hospital Day(s): 6.   Post op day(s): 4 Days Post-Op.   Interval History:  Patient seen and examined no acute events or new complaints overnight.  Patient reports he is feeling much better this morning and anxious to eat No fever, chills, nausea, or emesis. Very intermittent burping.  Leukopenia improved to 4.9K this morning Renal function remains stable, sCr - 0.92, BUN - 37, UO - 450cc No significant electrolyte derangements  He did pass NGT clamping trial yesterday and this was removed Additionally, he received fleets enema with large bowel movement following this Has remained NPO Worked with PT on Friday; recommended SNF at that time  Vital signs in last 24 hours: [min-max] current  Temp:  [97.9 F (36.6 C)-98.5 F (36.9 C)] 98.5 F (36.9 C) (11/08 0041) Pulse Rate:  [80-88] 87 (11/08 0041) Resp:  [16-19] 19 (11/08 0041) BP: (139-156)/(54-72) 140/63 (11/08 0041) SpO2:  [94 %-96 %] 95 % (11/08 0041)     Height: 6\' 4"  (193 cm) Weight: 116.6 kg BMI (Calculated): 31.3   Intake/Output last 2 shifts:  11/07 0701 - 11/08 0700 In: -  Out: 275 [Urine:200; Emesis/NG output:75]   Physical Exam:  Constitutional: alert, cooperative and no distress  Respiratory: breathing non-labored at rest Cardiovascular: regular rate and sinus rhythm  Gastrointestinal: Soft, incisional soreness, non-distended, no rebound/guarding Integumentary: Laparotomy incision is CDI, some early ecchymosis, no drainage or erythema  Labs:  CBC Latest Ref Rng & Units 05/18/2020 05/16/2020 05/15/2020  WBC 4.0 - 10.5 K/uL 4.0 3.0(L) 2.4(L)  Hemoglobin 13.0 - 17.0 g/dL 10.2(L) 11.7(L) 11.5(L)  Hematocrit 39 - 52 % 30.6(L) 36.4(L) 33.9(L)  Platelets 150 - 400 K/uL 147(L) 129(L) 109(L)   CMP Latest Ref Rng & Units 05/18/2020 05/17/2020 05/16/2020  Glucose 70 - 99 mg/dL 13/11/2019) 400(Q) 676(P)  BUN 6 - 20 mg/dL 950(D) 32(I) 71(I)  Creatinine 0.61 - 1.24 mg/dL 45(Y  0.99 8.33  Sodium 135 - 145 mmol/L 138 136 136  Potassium 3.5 - 5.1 mmol/L 4.0 3.8 4.0  Chloride 98 - 111 mmol/L 104 100 100  CO2 22 - 32 mmol/L 27 28 27   Calcium 8.9 - 10.3 mg/dL 8.2(L) 8.0(L) 7.6(L)  Total Protein 6.5 - 8.1 g/dL 4.8(L) 4.5(L) 4.5(L)  Total Bilirubin 0.3 - 1.2 mg/dL 0.5 0.5 0.7  Alkaline Phos 38 - 126 U/L 39 34(L) 30(L)  AST 15 - 41 U/L 14(L) 17 17  ALT 0 - 44 U/L 11 13 12      Imaging studies:   KUB (05/19/2020) personally reviewed with scattered bowel gas, air in colon, and radiologist report reviewed:  IMPRESSION: 1. Similar degree of moderate gaseous distension of small and large bowel throughout the abdomen. 2. Elevated right hemidiaphragm with right basilar opacity.   Assessment/Plan:  49 y.o. male with clinically improved small bowel obstructions 4 Days Post-Op s/p exploratory laparotomy and lysis of adhesions for small bowel obstruction.   - Okay to advance to CLD today; add boost breeze  - Continue TPN at goal rate today   - Monitor abdominal examination; on-going bowel function - Pain control prn; antiemetics prn -Medical management of comorbidities; hold home medications for now; added SSI - Mobilization encouraged as feasible; PT following; recommending SNF - DVT prophylaxis  All of the above findings and recommendations were discussed with the patient, and the medical team, and all of patient's questions were answered to his expressed satisfaction.  -- , PA-C [facility] 05/19/2020, 7:10 AM [phone]  M-F: 7am - 4pm

## 2020-05-20 LAB — GLUCOSE, CAPILLARY
Glucose-Capillary: 161 mg/dL — ABNORMAL HIGH (ref 70–99)
Glucose-Capillary: 174 mg/dL — ABNORMAL HIGH (ref 70–99)
Glucose-Capillary: 185 mg/dL — ABNORMAL HIGH (ref 70–99)
Glucose-Capillary: 186 mg/dL — ABNORMAL HIGH (ref 70–99)
Glucose-Capillary: 197 mg/dL — ABNORMAL HIGH (ref 70–99)

## 2020-05-20 MED ORDER — ENSURE ENLIVE PO LIQD: 237.0000 mL | Freq: Two times a day (BID) | ORAL | Status: DC

## 2020-05-20 MED ORDER — ENSURE MAX PROTEIN PO LIQD
11.0000 [oz_av] | Freq: Three times a day (TID) | ORAL | Status: DC
  Administered 2020-05-20 – 2020-05-23 (×10): 11 [oz_av] via ORAL
  Filled 2020-05-20: qty 330

## 2020-05-20 MED ORDER — INSULIN ASPART 100 UNIT/ML ~~LOC~~ SOLN
0.0000 [IU] | Freq: Three times a day (TID) | SUBCUTANEOUS | Status: DC
  Administered 2020-05-20 (×2): 4 [IU] via SUBCUTANEOUS
  Administered 2020-05-21 (×2): 3 [IU] via SUBCUTANEOUS
  Administered 2020-05-21: 09:00:00 4 [IU] via SUBCUTANEOUS
  Administered 2020-05-21: 17:00:00 3 [IU] via SUBCUTANEOUS
  Administered 2020-05-22: 7 [IU] via SUBCUTANEOUS
  Administered 2020-05-22 (×2): 4 [IU] via SUBCUTANEOUS
  Administered 2020-05-23: 3 [IU] via SUBCUTANEOUS
  Filled 2020-05-20 (×8): qty 1

## 2020-05-20 MED ORDER — TRAVASOL 10 % IV SOLN
INTRAVENOUS | Status: DC
  Filled 2020-05-20: qty 672

## 2020-05-20 NOTE — Consult Note (Signed)
PHARMACY - TOTAL PARENTERAL NUTRITION CONSULT NOTE   Indication: Prolonged ileus  Patient Measurements: Height: 6\' 4"  (193 cm) Weight: 116.6 kg (257 lb 0.9 oz) IBW/kg (Calculated) : 86.8 TPN AdjBW (KG): 94.2 Body mass index is 31.29 kg/m.  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  Glucose / Insulin: A1C 5.7, SSI  BG previous 24h 176 - 225 requiring 33 units SSI Electrolytes:  Electrolytes WNL Renal: Scr stable at ~ 1 mg/dL. 1825 mL UOP documented yesterday LFTs / TGs: LFTs wnl  TG 120>201 Prealbumin / albumin: 7/2.4 Intake / Output; MIVF:  --Net + 2L for this admission per chart --LR at 58mL/hr + TPN at 105 mL/hr (3L/day) --LBM 11/8 GI Imaging:  11/3 KUB: Persistent small-bowel obstruction  11/4 KUB: Stable to worsened moderate to marked diffuse small bowel dilatation with air-fluid levels, compatible with persistent distal small bowel obstruction 11/6 Abdominal X-ray: Unchanged diffuse gaseous small bowel dilatation, postoperative ileus versus recurrent obstruction 11/7 Abdominal X-ray: Moderate diffuse small bowel dilatation, not substantially changed, indeterminate for adynamic ileus versus distal small bowel obstruction. 11/8 Abdominal X-ray: Similar degree of moderate gaseous distension of small and large bowel throughout the abdomen Surgeries / Procedures: POD # 5 from exploratory laparotomy with lysis of adhesions for SBO  Central access: 05/15/20  TPN start date: 05/15/20  Nutritional Goals   (per RD recommendation on 05/15/20): kCal: 2600 - 2900/day, Protein: 130 - 145g/day, Fluid: 2800 - 3100 mL  Current Nutrition:  Full liquids  Plan:   Wean TPN to 1/2 rate of 50 mL/hr at 1800 (total volume  1.2L)  Amino Acids: 67.2 g  Dextrose: 186 g  Lipids: 38.4 g  Total calories: 1285 kCal  Electrolytes in TPN (standard): 57mEq/L of Na, 12mEq/L of K, 18mEq/L of Ca, 64mEq/L of Mg, and 10mmol/L of Phos. Cl:Ac ratio 1:1  Add standard MVI and trace elements to TPN  continue Resistant q4h SSI and adjust as needed   Continue LR to 20 mL/hr at 1800 for total fluid rate of 70 mL/hr (1.68L/day)  Monitor TPN labs on Mon/Thurs  12m 05/20/2020 7:52 AM

## 2020-05-20 NOTE — Progress Notes (Signed)
[facility] SURGICAL PROGRESS NOTE  Hospital Day(s): 7.   Post op day(s): 5 Days Post-Op.   Interval History:  Patient seen and examined no acute events or new complaints overnight.  Patient reports he is feeling good this morning No abdominal pain, distension, nausea, emesis, fever, chills No new labs this morning, persistent hyperglycemia noted UO is excellent, recorded at 1.8L Advanced to CLD yesterday; tolerating this well He continues to have BMs and pass flatus Working with therapies; recommending SNF vs 24 home care; patient prefers SNF; CSW following  Vital signs in last 24 hours: [min-max] current  Temp:  [98.2 F (36.8 C)-99 F (37.2 C)] 99 F (37.2 C) (11/09 0507) Pulse Rate:  [85-112] 85 (11/09 0507) Resp:  [16-21] 18 (11/09 0507) BP: (136-152)/(55-86) 152/55 (11/09 0507) SpO2:  [94 %-98 %] 98 % (11/09 0507)     Height: 6\' 4"  (193 cm) Weight: 116.6 kg BMI (Calculated): 31.3   Intake/Output last 2 shifts:  11/08 0701 - 11/09 0700 In: 2920.9 [P.O.:480; I.V.:2440.9] Out: 1825 [Urine:1825]   Physical Exam:  Constitutional: alert, cooperative and no distress Respiratory: breathing non-labored at rest Cardiovascular: regular rate and sinus rhythm  Gastrointestinal:Soft,no appreciable soreness, non-distended, no rebound/guarding Integumentary:Laparotomy incision is CDI, some early ecchymosis, no drainage or erythema  Labs:  CBC Latest Ref Rng & Units 05/19/2020 05/18/2020 05/16/2020  WBC 4.0 - 10.5 K/uL 4.9 4.0 3.0(L)  Hemoglobin 13.0 - 17.0 g/dL 10.3(L) 10.2(L) 11.7(L)  Hematocrit 39 - 52 % 31.6(L) 30.6(L) 36.4(L)  Platelets 150 - 400 K/uL 163 147(L) 129(L)   CMP Latest Ref Rng & Units 05/19/2020 05/18/2020 05/17/2020  Glucose 70 - 99 mg/dL 13/12/2019) 935(T) 017(B)  BUN 6 - 20 mg/dL 939(Q) 30(S) 92(Z)  Creatinine 0.61 - 1.24 mg/dL 30(Q 7.62 2.63  Sodium 135 - 145 mmol/L 139 138 136  Potassium 3.5 - 5.1 mmol/L 4.1 4.0 3.8  Chloride 98 - 111 mmol/L  103 104 100  CO2 22 - 32 mmol/L 27 27 28   Calcium 8.9 - 10.3 mg/dL 8.2(L) 8.2(L) 8.0(L)  Total Protein 6.5 - 8.1 g/dL 5.0(L) 4.8(L) 4.5(L)  Total Bilirubin 0.3 - 1.2 mg/dL 0.5 0.5 0.5  Alkaline Phos 38 - 126 U/L 41 39 34(L)  AST 15 - 41 U/L 14(L) 14(L) 17  ALT 0 - 44 U/L 10 11 13      Imaging studies: No new pertinent imaging studies   Assessment/Plan:  49 y.o. male with more consistent return of bowel function 5 Days Post-Op s/p exploratory laparotomy and lysis of adhesionsfor small bowel obstruction.   - Advance to full liquid diet this morning; add ensure; soft diet later today             - Begin to wean TPN to 1/2 rate today             - Monitor abdominal examination; on-going bowel function - Pain control prn; antiemetics prn -Medical management of comorbidities; hold home medications for now; added SSI - Mobilization encouraged as feasible; PT following; recommending SNF - DVT prophylaxis   - Discharge Planning: will begin advancing diet and weaning from TPN today, will also need SNF placement. Anticipate he may be ready in next 24-48 hours  All of the above findings and recommendations were discussed with the patient, and the medical team, and all of patient's questions were answered to his expressed satisfaction.  -- , PA-C [facility] 05/20/2020, 6:55 AM [phone] M-F: 7am - 4pm

## 2020-05-20 NOTE — Evaluation (Signed)
Occupational Therapy Evaluation Patient Details Name: Derrick Atkins MRN: [id number] DOB: [birth year] Today's Date: 05/20/2020    History of Present Illness Pt is a 49 y/o M admitted on 05/13/20 with 3 days of N/V & constipation after eating raw peanut shells. CT concerning for small bowel obstruction. Pt underwent exploratory laparotomy on 05/15/20. PMH: schizoaffective disorder, CP, DM, HTN, hypothyroidism, depression, scoliosis   Clinical Impression   Pt seen for OT evaluation this date.  Prior to hospital admission, pt was generally independent with basic ADL, light meal prep, and using blister packs for medication mgt. No AD for mobility. Pt required assist from aunt for transportation and housekeeping tasks. Denies falls. Currently pt demonstrates impairments as described below (See OT problem list) which functionally limit his ability to perform ADL/self-care tasks. Pt currently requires Mod A for bed mobility, Min A for ADL transfers, and Min-Mod A for LB ADL Tasks. Limited this date by abdominal pain, particularly with bending. Pt instructed in falls prevention, home/routines modifications, AE/DME for ADL, pain mgt strategies. Pt would benefit from skilled OT services to address noted impairments and functional limitations (see below for any additional details) in order to maximize safety and independence while minimizing falls risk and caregiver burden. Upon hospital discharge, recommend HHOT to maximize pt safety and return to functional independence during meaningful occupations of daily life PENDING additional progress with therapy and improved pain mgt.      Follow Up Recommendations  Home health OT;Supervision - Intermittent    Equipment Recommendations  3 in 1 bedside commode    Recommendations for Other Services       Precautions / Restrictions Precautions Precautions: Fall Precaution Comments: log rolling to protect abdominal incision Restrictions Weight Bearing  Restrictions: No      Mobility Bed Mobility Overal bed mobility: Needs Assistance Bed Mobility: Supine to Sit     Supine to sit: Mod assist;HOB elevated          Transfers Overall transfer level: Needs assistance Equipment used: Rolling walker (2 wheeled) Transfers: Sit to/from Stand Sit to Stand: Min assist;From elevated surface         General transfer comment: VC for hand/foot placement    Balance Overall balance assessment: Needs assistance Sitting-balance support: Feet supported;Single extremity supported Sitting balance-Leahy Scale: Fair       Standing balance-Leahy Scale: Fair                             ADL either performed or assessed with clinical judgement   ADL Overall ADL's : Needs assistance/impaired                                       General ADL Comments: Pt requires Min-Mod A for LB ADL tasks, Min A for ADL transfers, CGA for ADL mobility     Vision Baseline Vision/History: No visual deficits Patient Visual Report: No change from baseline       Perception     Praxis      Pertinent Vitals/Pain Pain Assessment: 0-10 Pain Score: 8  Pain Location: abdomen Pain Descriptors / Indicators: Aching Pain Intervention(s): Limited activity within patient's tolerance;Monitored during session;Repositioned;Patient requesting pain meds-RN notified     Hand Dominance Left   Extremity/Trunk Assessment Upper Extremity Assessment Upper Extremity Assessment: Generalized weakness (hx of CP with resultant RUE weakness/impaired neuromuscular control)   Lower  Extremity Assessment Lower Extremity Assessment: Generalized weakness (hx of CP with resultant RLE weakness/impaired neuromuscular control)       Communication Communication Communication: No difficulties   Cognition Arousal/Alertness: Awake/alert Behavior During Therapy: WFL for tasks assessed/performed Overall Cognitive Status: Within Functional Limits for tasks  assessed                                     General Comments       Exercises Other Exercises Other Exercises: Pt instructed in falls prevention, home/routines modifications, AE/DME for ADL, pain mgt strategies   Shoulder Instructions      Home Living Family/patient expects to be discharged to:: Private residence Living Arrangements: Alone Available Help at Discharge: Family;Available PRN/intermittently (aunt and brother) Type of Home: Apartment Home Access: Level entry     Home Layout: One level     Bathroom Shower/Tub: Chief Strategy Officer: Standard     Home Equipment: Grab bars - toilet          Prior Functioning/Environment Level of Independence: Needs assistance  Gait / Transfers Assistance Needed: indep with mobility, no falls ADL's / Homemaking Assistance Needed: indep with light meal prep, bathing, dressing, toileting; blister packs for med mgt; does not drive, aunt typically provides transportation and assists with housekeeping tasks            OT Problem List: Decreased strength;Pain;Decreased activity tolerance;Impaired balance (sitting and/or standing);Decreased knowledge of use of DME or AE;Impaired UE functional use      OT Treatment/Interventions: Self-care/ADL training;Therapeutic exercise;Therapeutic activities;DME and/or AE instruction;Patient/family education;Balance training    OT Goals(Current goals can be found in the care plan section) Acute Rehab OT Goals Patient Stated Goal: to get better OT Goal Formulation: With patient Time For Goal Achievement: 06/03/20 Potential to Achieve Goals: Good ADL Goals Pt Will Perform Lower Body Dressing: with modified independence;with adaptive equipment;sit to/from stand Pt Will Transfer to Toilet: with modified independence;ambulating (BSC over toilet, LRAD for amb) Additional ADL Goal #1: Pt will verbalize plan to implement at least 1 learned energy conservation strategy to  maximize safety and return to PLOF during recovery. Additional ADL Goal #2: Pt will independently incorporate at least 1 learned adaptive strategy for LB ADL tasks to minimize abdominal pain for improved safety/independence/pain mgt during ADL tasks.  OT Frequency: Min 2X/week   Barriers to D/C:            Co-evaluation              AM-PAC OT "6 Clicks" Daily Activity     Outcome Measure Help from another person eating meals?: None Help from another person taking care of personal grooming?: A Little Help from another person toileting, which includes using toliet, bedpan, or urinal?: A Little Help from another person bathing (including washing, rinsing, drying)?: A Little Help from another person to put on and taking off regular upper body clothing?: None Help from another person to put on and taking off regular lower body clothing?: A Little 6 Click Score: 20   End of Session Equipment Utilized During Treatment: Gait belt;Rolling walker Nurse Communication: Patient requests pain meds  Activity Tolerance: Patient limited by pain Patient left: in chair;with call bell/phone within reach  OT Visit Diagnosis: Other abnormalities of gait and mobility (R26.89);Muscle weakness (generalized) (M62.81);Pain Pain - Right/Left:  (abdomen)  Time: 6286-3817 OT Time Calculation (min): 32 min Charges:  OT General Charges $OT Visit: 1 Visit OT Evaluation $OT Eval Moderate Complexity: 1 Mod OT Treatments $Self Care/Home Management : 23-37 mins  Richrd Prime, MPH, MS, OTR/L ascom [phone] 05/20/20, 10:30 AM

## 2020-05-21 LAB — GLUCOSE, CAPILLARY
Glucose-Capillary: 163 mg/dL — ABNORMAL HIGH (ref 70–99)
Glucose-Capillary: 133 mg/dL — ABNORMAL HIGH (ref 70–99)
Glucose-Capillary: 130 mg/dL — ABNORMAL HIGH (ref 70–99)
Glucose-Capillary: 129 mg/dL — ABNORMAL HIGH (ref 70–99)

## 2020-05-21 NOTE — Progress Notes (Addendum)
[facility] SURGICAL PROGRESS NOTE  Hospital Day(s): 8.   Post op day(s): 6 Days Post-Op.   Interval History:  Patient seen and examined no acute events or new complaints overnight.  Patient reports he is feeling great and his abdominal soreness continues to improve significantly  No fever, chills, nausea, emesis No new labs or imaging Diet was advanced to soft diet; tolerating well Continues to have bowel function Working with therapies; recommending HH vs SNF; patient reportedly prefers SNF; awaiting placement   Vital signs in last 24 hours: [min-max] current  Temp:  [97.5 F (36.4 C)-98.4 F (36.9 C)] 98.2 F (36.8 C) (11/10 0043) Pulse Rate:  [70-82] 70 (11/10 0043) Resp:  [14-18] 14 (11/10 0043) BP: (132-144)/(58-72) 144/58 (11/10 0043) SpO2:  [96 %-98 %] 98 % (11/10 0043)     Height: 6\' 4"  (193 cm) Weight: 116.6 kg BMI (Calculated): 31.3   Intake/Output last 2 shifts:  11/09 0701 - 11/10 0700 In: 120 [P.O.:120] Out: 1550 [Urine:1550]   Physical Exam:  Constitutional: alert, cooperative and no distress Respiratory: breathing non-labored at rest Cardiovascular: regular rate and sinus rhythm  Gastrointestinal:Soft,no appreciable soreness, non-distended, no rebound/guarding Integumentary:Laparotomy incision is CDI, some early ecchymosis, no drainage or erythema  Labs:  CBC Latest Ref Rng & Units 05/19/2020 05/18/2020 05/16/2020  WBC 4.0 - 10.5 K/uL 4.9 4.0 3.0(L)  Hemoglobin 13.0 - 17.0 g/dL 10.3(L) 10.2(L) 11.7(L)  Hematocrit 39 - 52 % 31.6(L) 30.6(L) 36.4(L)  Platelets 150 - 400 K/uL 163 147(L) 129(L)   CMP Latest Ref Rng & Units 05/19/2020 05/18/2020 05/17/2020  Glucose 70 - 99 mg/dL 13/12/2019) 885(O) 277(A)  BUN 6 - 20 mg/dL 128(N) 86(V) 67(M)  Creatinine 0.61 - 1.24 mg/dL 09(O 7.09 6.28  Sodium 135 - 145 mmol/L 139 138 136  Potassium 3.5 - 5.1 mmol/L 4.1 4.0 3.8  Chloride 98 - 111 mmol/L 103 104 100  CO2 22 - 32 mmol/L 27 27 28   Calcium 8.9 -  10.3 mg/dL 8.2(L) 8.2(L) 8.0(L)  Total Protein 6.5 - 8.1 g/dL 5.0(L) 4.8(L) 4.5(L)  Total Bilirubin 0.3 - 1.2 mg/dL 0.5 0.5 0.5  Alkaline Phos 38 - 126 U/L 41 39 34(L)  AST 15 - 41 U/L 14(L) 14(L) 17  ALT 0 - 44 U/L 10 11 13     Imaging studies: No new pertinent imaging studies   Assessment/Plan:  49 y.o. male doing well with return of bowel function 6 Days Post-Op s/p exploratory laparotomy and lysis of adhesionsfor small bowel obstruction.   - Continue soft diet + nutritional supplementation   - Okay to discontinue TPN & IVF  today   - Monitor abdominal examination; on-going bowel function - Pain control prn; antiemetics prn -Medical management of comorbidities; hold home medications for now; added SSI - Mobilization encouraged as feasible;PT following; recommending SNF - DVT prophylaxis              - Discharge Planning: Awaiting placement, patient prefers SNF over HHPT; will need bowel regimen for home with daily Miralax and Colace  All of the above findings and recommendations were discussed with the patient, and the medical team, and all of patient's questions were answered to his expressed satisfaction.  -- , PA-C [facility] 05/21/2020, 7:15 AM [phone] M-F: 7am - 4pm

## 2020-05-21 NOTE — Progress Notes (Signed)
Physical Therapy Treatment Patient Details Name: Derrick Atkins MRN: [id number] DOB: 01/24/1971 Today's Date: 05/21/2020    History of Present Illness Pt is a 49 y/o M admitted on 05/13/20 with 3 days of N/V & constipation after eating raw peanut shells. CT concerning for small bowel obstruction. Pt underwent exploratory laparotomy on 05/15/20. PMH: schizoaffective disorder, CP, DM, HTN, hypothyroidism, depression, scoliosis    PT Comments    Pt was sitting in recliner upon arriving. He agrees to PT session and is cooperative and pleasant throughout. A and O x 4. Reports," I want to go home. Do you think I need rehab?" Discussed home environment and pt reports would be willing to have [facility] services enter home. Pt was able to stand form recliner without physical assistance, ambulate 125 ft with RW with CGA at first progressing to supervision only. Was a little fatigued after gait training but overall tolerated well. HR elevated to 118 and sao2 >92% throughout. Author called pt's legal guardian to discuss DC recommendations. Pt wants to DC home but guardian requesting pt go to rehab so she can finish cleaning pt's apartment prior to DC. Chartered loss adjuster discussed with guardian that pt is progressing well from a PT standpoint and new recommendation for DC home with HHPT. Pt was sitting in recliner post session with call bell in reach and RN and SW aware of new recommendation.     Follow Up Recommendations  Home health PT;Supervision - Intermittent     Equipment Recommendations  Rolling walker with 5" wheels    Recommendations for Other Services       Precautions / Restrictions Precautions Precautions: Fall Precaution Comments: abdominal incision Restrictions Weight Bearing Restrictions: No    Mobility  Bed Mobility    General bed mobility comments: pt was in recliner pre/post session.   Transfers Overall transfer level: Needs assistance Equipment used: Rolling walker (2 wheeled) Transfers:  Sit to/from Stand Sit to Stand: Supervision         General transfer comment: pt was able to STS from recliner 3 x during session without physical assistance.   Ambulation/Gait Ambulation/Gait assistance: Min guard;Supervision Gait Distance (Feet): 125 Feet Assistive device: Rolling walker (2 wheeled) Gait Pattern/deviations: Decreased step length - left;Decreased step length - right;Decreased stride length;Decreased dorsiflexion - right;Decreased dorsiflexion - left Gait velocity: decreased   General Gait Details: pt was able to ambulate 125 ft with RW without LOB. does have slight knee flexion throughout but pt reports he has ambulated this way for awhile. pt tolerated well.       Balance Overall balance assessment: Needs assistance Sitting-balance support: Feet supported Sitting balance-Leahy Scale: Good Sitting balance - Comments: no LOB seated edge of recliner without UE support   Standing balance support: Bilateral upper extremity supported;During functional activity Standing balance-Leahy Scale: Fair Standing balance comment: no LOB with BUE support. does have balance deficits without UE support. recommend use of AD at all times at DC       Cognition Arousal/Alertness: Awake/alert Behavior During Therapy: [facility] for tasks assessed/performed Overall Cognitive Status: Within Functional Limits for tasks assessed      General Comments: hx of CP             Pertinent Vitals/Pain Pain Assessment: No/denies pain Pain Score: 0-No pain           PT Goals (current goals can now be found in the care plan section) Acute Rehab PT Goals Patient Stated Goal: to get better Progress towards PT goals: Progressing  toward goals    Frequency    Min 2X/week      PT Plan Discharge plan needs to be updated       AM-PAC PT "6 Clicks" Mobility   Outcome Measure  Help needed turning from your back to your side while in a flat bed without using bedrails?: A Little Help  needed moving from lying on your back to sitting on the side of a flat bed without using bedrails?: A Little Help needed moving to and from a bed to a chair (including a wheelchair)?: A Little Help needed standing up from a chair using your arms (e.g., wheelchair or bedside chair)?: A Little Help needed to walk in hospital room?: A Little Help needed climbing 3-5 steps with a railing? : A Lot 6 Click Score: 17    End of Session   Activity Tolerance: Patient tolerated treatment well Patient left: in chair;with call bell/phone within reach;with chair alarm set Nurse Communication: Mobility status PT Visit Diagnosis: Muscle weakness (generalized) (M62.81);Unsteadiness on feet (R26.81);Other abnormalities of gait and mobility (R26.89)     Time: 1610-9604 PT Time Calculation (min) (ACUTE ONLY): 32 min  Charges:  $Gait Training: 8-22 mins $Therapeutic Activity: 8-22 mins                     Jetta Lout PTA 05/21/20, 3:09 PM

## 2020-05-21 NOTE — TOC Progression Note (Addendum)
Transition of Care [facility]) - Progression Note    Patient Details  Name: DAMAR PETIT MRN: [id number] Date of Birth: 02-10-1971  [facility] of [facility] CM/SW Contact  [name], LCSW Phone Number: 05/21/2020, 11:19 AM  Clinical Narrative:   30 day note signed by Dr. Aleen Campi. Information uploaded into [facility] for PASRR screening. Screening pending.  3:50- Updated by PT Harrold Donath that recommendation is changing recommendation to Home Health and has spoken with Legal Guardian [name]. CSW called [name] who confirmed this. She reported she is agreeable to [facility] and HHPT, denied having an agency preference. She requested [facility] as well. CSW made referral to [facility] [employer] [facility]. [name] also agreeable to 3 in 1 and RW, DME ordered through [employer].   Expected Discharge Plan: Home/Self Care Barriers to Discharge: Continued Medical Work up  Expected Discharge Plan and Services Expected Discharge Plan: Home/Self Care       Living arrangements for the past 2 months: Single Family Home                                       Social Determinants of Health (SDOH) Interventions    Readmission Risk Interventions No flowsheet data found.

## 2020-05-22 ENCOUNTER — Encounter: Payer: Self-pay | Admitting: Surgery

## 2020-05-22 LAB — COMPREHENSIVE METABOLIC PANEL
ALT: 10 U/L (ref 0–44)
AST: 15 U/L (ref 15–41)
Albumin: 2.4 g/dL — ABNORMAL LOW (ref 3.5–5.0)
Alkaline Phosphatase: 52 U/L (ref 38–126)
Anion gap: 10 (ref 5–15)
BUN: 18 mg/dL (ref 6–20)
CO2: 25 mmol/L (ref 22–32)
Calcium: 8.2 mg/dL — ABNORMAL LOW (ref 8.9–10.3)
Chloride: 104 mmol/L (ref 98–111)
Creatinine, Ser: 0.7 mg/dL (ref 0.61–1.24)
GFR, Estimated: >60 mL/min (ref >60)
Glucose, Bld: 109 mg/dL — ABNORMAL HIGH (ref 70–99)
Potassium: 4.2 mmol/L (ref 3.5–5.1)
Sodium: 139 mmol/L (ref 135–145)
Total Bilirubin: 0.5 mg/dL (ref 0.3–1.2)
Total Protein: 5 g/dL — ABNORMAL LOW (ref 6.5–8.1)

## 2020-05-22 LAB — GLUCOSE, CAPILLARY
Glucose-Capillary: 120 mg/dL — ABNORMAL HIGH (ref 70–99)
Glucose-Capillary: 176 mg/dL — ABNORMAL HIGH (ref 70–99)
Glucose-Capillary: 165 mg/dL — ABNORMAL HIGH (ref 70–99)
Glucose-Capillary: 207 mg/dL — ABNORMAL HIGH (ref 70–99)

## 2020-05-22 LAB — MAGNESIUM: Magnesium: 1.5 mg/dL — ABNORMAL LOW (ref 1.7–2.4)

## 2020-05-22 LAB — PHOSPHORUS: Phosphorus: 4.2 mg/dL (ref 2.5–4.6)

## 2020-05-22 MED ORDER — SIMETHICONE 80 MG PO CHEW
160.0000 mg | CHEWABLE_TABLET | Freq: Three times a day (TID) | ORAL | Status: DC | PRN
  Administered 2020-05-22 – 2020-05-23 (×2): 160 mg via ORAL
  Filled 2020-05-22 (×3): qty 2

## 2020-05-22 MED ORDER — PANTOPRAZOLE SODIUM 40 MG PO TBEC
40.0000 mg | DELAYED_RELEASE_TABLET | Freq: Every day | ORAL | Status: DC
  Administered 2020-05-22: 18:00:00 40 mg via ORAL
  Filled 2020-05-22: qty 1

## 2020-05-22 MED ORDER — OXYMETAZOLINE HCL 0.05 % NA SOLN
1.0000 | Freq: Two times a day (BID) | NASAL | Status: DC
  Administered 2020-05-22 – 2020-05-23 (×3): 1 via NASAL
  Filled 2020-05-22: qty 15

## 2020-05-22 MED ORDER — MAGNESIUM SULFATE 2 GM/50ML IV SOLN
2.0000 g | Freq: Once | INTRAVENOUS | Status: AC
  Administered 2020-05-22: 2 g via INTRAVENOUS
  Filled 2020-05-22: qty 50

## 2020-05-22 NOTE — Progress Notes (Signed)
Physical Therapy Treatment Patient Details Name: Derrick Atkins MRN: [id number] DOB: 24-Feb-1971 Today's Date: 05/22/2020    History of Present Illness Pt is a 49 y/o M admitted on 05/13/20 with 3 days of N/V & constipation after eating raw peanut shells. CT concerning for small bowel obstruction. Pt underwent exploratory laparotomy on 05/15/20. PMH: schizoaffective disorder, CP, DM, HTN, hypothyroidism, depression, scoliosis    PT Comments    Patient sitting up in chair on arrival to room. Patient with earlier nose bleed requiring nasal packing to control epistaxis. Deferred ambulation. Patient agreeable to LE therapeutic exercises for strengthening in seated position. Patient required AAROM and cues for exercise technique. No shortness of breath noted, however patient does require rest breaks between exercise sets due to muscle fatigue. Recommend to continue with PT to maximize function and safety to facilitate independence.    Follow Up Recommendations  Home health PT;Supervision - Intermittent     Equipment Recommendations   (patient has had RW and BSC delivered to hospital room )    Recommendations for Other Services       Precautions / Restrictions Precautions Precautions: Fall Precaution Comments: abdominal incision Restrictions Weight Bearing Restrictions: No    Mobility  Bed Mobility               General bed mobility comments: not assessed as patient sitting up on arrival and post session   Transfers                    Ambulation/Gait             General Gait Details: gait deferred as patient with earlier nose bleed and just recently getting bleeding under control with  nasal packing to control epistaxis   Stairs             Wheelchair Mobility    Modified Rankin (Stroke Patients Only)       Balance                                            Cognition Arousal/Alertness: Awake/alert Behavior During Therapy:  WFL for tasks assessed/performed Overall Cognitive Status: Within Functional Limits for tasks assessed                                        Exercises General Exercises - Lower Extremity Ankle Circles/Pumps: AROM;Strengthening;Both;10 reps (limited AROM right due to chronic weakness ) Long Arc Quad: AAROM;Strengthening;Both;10 reps;Seated Hip ABduction/ADduction: AAROM;Strengthening;Both;10 reps;Seated Straight Leg Raises: AAROM;Strengthening;Both;10 reps;Seated Hip Flexion/Marching: AAROM;Strengthening;Both;10 reps;Seated Other Exercises Other Exercises: verbal, tactile, and visual cues for exercise technique for strengthening.     General Comments        Pertinent Vitals/Pain Pain Assessment: No/denies pain Pain Location: vague complaint about chronic back pain due to scoliosis, however patient does not give numerical value     Home Living                      Prior Function            PT Goals (current goals can now be found in the care plan section) Acute Rehab PT Goals Patient Stated Goal: to go home  Time For Goal Achievement: 05/30/20 Potential to Achieve Goals: Good Progress towards PT goals: Progressing  toward goals    Frequency    Min 2X/week      PT Plan Current plan remains appropriate    Co-evaluation              AM-PAC PT "6 Clicks" Mobility   Outcome Measure  Help needed turning from your back to your side while in a flat bed without using bedrails?: A Little Help needed moving from lying on your back to sitting on the side of a flat bed without using bedrails?: A Little Help needed moving to and from a bed to a chair (including a wheelchair)?: A Little Help needed standing up from a chair using your arms (e.g., wheelchair or bedside chair)?: A Little Help needed to walk in hospital room?: A Little Help needed climbing 3-5 steps with a railing? : A Lot 6 Click Score: 17    End of Session   Activity Tolerance:  Patient tolerated treatment well Patient left: in chair;with call bell/phone within reach Nurse Communication: Mobility status (via white board up to date with mobility status ) PT Visit Diagnosis: Muscle weakness (generalized) (M62.81);Unsteadiness on feet (R26.81);Other abnormalities of gait and mobility (R26.89)     Time: 0947-0962 PT Time Calculation (min) (ACUTE ONLY): 16 min  Charges:  $Therapeutic Exercise: 8-22 mins                     Donna Bernard, PT, MPT    [employer] 05/22/2020, 3:21 PM

## 2020-05-22 NOTE — Progress Notes (Signed)
Pt started bleeding from the nares, pressure applied, MD contacted and aware. Ice applied to forehead, pt sitting up in chair at this time, bleeding eased. MD stated to call ENT if it persists. Incoming nurse to be notified.

## 2020-05-22 NOTE — Progress Notes (Signed)
Occupational Therapy Treatment Patient Details Name: Derrick Atkins MRN: [id number] DOB: 02-Jun-1971 Today's Date: 05/22/2020    History of present illness Pt is a 49 y/o M admitted on 05/13/20 with 3 days of N/V & constipation after eating raw peanut shells. CT concerning for small bowel obstruction. Pt underwent exploratory laparotomy on 05/15/20. PMH: schizoaffective disorder, CP, DM, HTN, hypothyroidism, depression, scoliosis   OT comments  Pt seen for OT tx this date. Pt instructed in ECS with handout provided to support recall and carryover. Pt verbalized understanding. Appreciative of education provided. OT Facilitated problem solving of ADL routines as well as meal prep. Pt continues to benefit from skilled OT services. Pt progressing towards goals. HHOT remains appropriate.    Follow Up Recommendations  Home health OT;Supervision - Intermittent    Equipment Recommendations  3 in 1 bedside commode    Recommendations for Other Services      Precautions / Restrictions Precautions Precautions: Fall Precaution Comments: abdominal incision Restrictions Weight Bearing Restrictions: No       Mobility Bed Mobility               General bed mobility comments: pt was in recliner pre/post session.   Transfers                      Balance                                           ADL either performed or assessed with clinical judgement   ADL                                               Vision       Perception     Praxis      Cognition Arousal/Alertness: Awake/alert Behavior During Therapy: WFL for tasks assessed/performed Overall Cognitive Status: Within Functional Limits for tasks assessed                                          Exercises Other Exercises Other Exercises: Pt instructed in ECS with handout provided to support recall and carryover   Shoulder Instructions       General  Comments      Pertinent Vitals/ Pain       Pain Assessment: No/denies pain  Home Living                                          Prior Functioning/Environment              Frequency  Min 2X/week        Progress Toward Goals  OT Goals(current goals can now be found in the care plan section)  Progress towards OT goals: Progressing toward goals  Acute Rehab OT Goals Patient Stated Goal: to get better OT Goal Formulation: With patient Time For Goal Achievement: 06/03/20 Potential to Achieve Goals: Good  Plan Discharge plan remains appropriate;Frequency remains appropriate    Co-evaluation  AM-PAC OT "6 Clicks" Daily Activity     Outcome Measure   Help from another person eating meals?: None Help from another person taking care of personal grooming?: A Little Help from another person toileting, which includes using toliet, bedpan, or urinal?: A Little Help from another person bathing (including washing, rinsing, drying)?: A Little Help from another person to put on and taking off regular upper body clothing?: None Help from another person to put on and taking off regular lower body clothing?: A Little 6 Click Score: 20    End of Session    OT Visit Diagnosis: Other abnormalities of gait and mobility (R26.89);Muscle weakness (generalized) (M62.81)   Activity Tolerance Patient tolerated treatment well   Patient Left in chair;with call bell/phone within reach;with chair alarm set   Nurse Communication          Time: 9702-6378 OT Time Calculation (min): 9 min  Charges: OT General Charges $OT Visit: 1 Visit OT Treatments $Self Care/Home Management : 8-22 mins  Richrd Prime, MPH, MS, OTR/L ascom [phone] 05/22/20, 12:49 PM

## 2020-05-22 NOTE — Plan of Care (Signed)
  Problem: Education: Goal: Knowledge of General Education information will improve Description: Including pain rating scale, medication(s)/side effects and non-pharmacologic comfort measures Outcome: Progressing   Problem: Clinical Measurements: Goal: Ability to maintain clinical measurements within normal limits will improve Outcome: Progressing Goal: Cardiovascular complication will be avoided Outcome: Progressing   Problem: Activity: Goal: Risk for activity intolerance will decrease Outcome: Progressing   Problem: Coping: Goal: Level of anxiety will decrease Outcome: Progressing   Problem: Elimination: Goal: Will not experience complications related to urinary retention Outcome: Adequate for Discharge   Problem: Pain Managment: Goal: General experience of comfort will improve Outcome: Progressing   Problem: Safety: Goal: Ability to remain free from injury will improve Outcome: Progressing   Problem: Skin Integrity: Goal: Risk for impaired skin integrity will decrease Outcome: Progressing

## 2020-05-22 NOTE — Discharge Instructions (Signed)
In addition to included general post-operative instructions for exploratory laparotomy,  Diet: Resume home diet.   Activity: No heavy lifting >20 pounds (children, pets, laundry, garbage) for 6 weeks, but light activity and walking are encouraged. Do not drive or drink alcohol if taking narcotic pain medications or having pain that might distract from driving.  Wound care: You may shower/get incision wet with soapy water and pat dry (do not rub incisions), but no baths or submerging incision underwater until follow-up.   Medications: Resume all home medications. For mild to moderate pain: acetaminophen (Tylenol) or ibuprofen/naproxen (if no kidney disease). Combining Tylenol with alcohol can substantially increase your risk of causing liver disease. Narcotic pain medications, if prescribed, can be used for severe pain, though may cause nausea, constipation, and drowsiness. Do not combine Tylenol and Percocet (or similar) within a 6 hour period as Percocet (and similar) contain(s) Tylenol. If you do not need the narcotic pain medication, you do not need to fill the prescription.  Constipation: Continue to take Miralax and Colace once daily to help avoid constipation. These medications can be obtained over the counter.   Call office [phone]) at any time if any questions, worsening pain, fevers/chills, bleeding, drainage from incision site, or other concerns.

## 2020-05-22 NOTE — Anesthesia Postprocedure Evaluation (Signed)
Anesthesia Post Note  Patient: Derrick Atkins  Procedure(s) Performed: EXPLORATORY LAPAROTOMY (N/A )  Patient location during evaluation: PACU Anesthesia Type: General Level of consciousness: awake and alert Pain management: pain level controlled Vital Signs Assessment: post-procedure vital signs reviewed and stable Respiratory status: spontaneous breathing, nonlabored ventilation, respiratory function stable and patient connected to nasal cannula oxygen Cardiovascular status: blood pressure returned to baseline and stable Postop Assessment: no apparent nausea or vomiting Anesthetic complications: no   No complications documented.   Last Vitals:  Vitals:   05/22/20 0005 05/22/20 0429  BP: (!) 147/61 (!) 149/53  Pulse: 86 81  Resp: 18 20  Temp: 36.4 C 37.3 C  SpO2: 97% 94%    Last Pain:  Vitals:   05/22/20 0005  TempSrc: Oral  PainSc: 0-No pain                 Christia Reading

## 2020-05-22 NOTE — Progress Notes (Signed)
Patient had very large amounts of blood coming from nose, notified MD and charge nurse. MD ordered afrin nasal spray and packing. Nurse changed packing 2-3 times today. Nose bled on and off, and stopped bleeding around 1830

## 2020-05-22 NOTE — Plan of Care (Signed)

## 2020-05-22 NOTE — Progress Notes (Signed)
[facility] SURGICAL PROGRESS NOTE  Hospital Day(s): 9.   Post op day(s): 7 Days Post-Op.   Interval History:  Patient seen and examined Unfortunately, overnight he developed epistaxis which was difficult to control with pressure alone. This persisted into this morning. We did utilize Afrin which stopped the bleeding temporarily. However, when he woke up from a nap he noticed return of bleeding from left nare. No history of similar in the past.   Patient reports he is otherwise feeling well without abdominal pain, nausea, or distension No new labs Tolerating soft diet with continued bowel function Working with therapies; progressed to home health    Vital signs in last 24 hours: [min-max] current  Temp:  [96.8 F (36 C)-99.1 F (37.3 C)] 98.1 F (36.7 C) (11/11 1204) Pulse Rate:  [81-92] 92 (11/11 1204) Resp:  [16-20] 18 (11/11 1204) BP: (134-165)/(53-74) 143/74 (11/11 1204) SpO2:  [94 %-99 %] 97 % (11/11 1204)     Height: 6\' 4"  (193 cm) Weight: 116.6 kg BMI (Calculated): 31.3   Intake/Output last 2 shifts:  11/10 0701 - 11/11 0700 In: 570 [P.O.:570] Out: 3475 [Urine:3475]   Physical Exam:  Constitutional: alert, cooperative and no distress HEENT: Epistaxis present to the left nare Respiratory: breathing non-labored at rest Cardiovascular: regular rate and sinus rhythm  Gastrointestinal:Soft,no appreciable soreness, non-distended, no rebound/guarding Integumentary:Laparotomy incision is CDI, some early ecchymosis, no drainage or erythema  Labs:  CBC Latest Ref Rng & Units 05/19/2020 05/18/2020 05/16/2020  WBC 4.0 - 10.5 K/uL 4.9 4.0 3.0(L)  Hemoglobin 13.0 - 17.0 g/dL 10.3(L) 10.2(L) 11.7(L)  Hematocrit 39 - 52 % 31.6(L) 30.6(L) 36.4(L)  Platelets 150 - 400 K/uL 163 147(L) 129(L)   CMP Latest Ref Rng & Units 05/22/2020 05/19/2020 05/18/2020  Glucose 70 - 99 mg/dL 13/01/2020) 846(N) 629(B)  BUN 6 - 20 mg/dL 18 284(X) 32(G)  Creatinine 0.61 - 1.24 mg/dL 40(N  0.27 2.53  Sodium 135 - 145 mmol/L 139 139 138  Potassium 3.5 - 5.1 mmol/L 4.2 4.1 4.0  Chloride 98 - 111 mmol/L 104 103 104  CO2 22 - 32 mmol/L 25 27 27   Calcium 8.9 - 10.3 mg/dL 8.2(L) 8.2(L) 8.2(L)  Total Protein 6.5 - 8.1 g/dL 5.0(L) 5.0(L) 4.8(L)  Total Bilirubin 0.3 - 1.2 mg/dL 0.5 0.5 0.5  Alkaline Phos 38 - 126 U/L 52 41 39  AST 15 - 41 U/L 15 14(L) 14(L)  ALT 0 - 44 U/L 10 10 11      Imaging studies: No new pertinent imaging studies   Assessment/Plan:  49 y.o. male overall doing well now with epistaxis 7 Days Post-Op s/p exploratory laparotomy and lysis of adhesionsfor small bowel obstruction.   - Will continue with Afrin and nasal packing to control epistaxis. If this persists, we will consult ENT for assistance   - Continue soft diet + nutritional supplementation             - Monitor abdominal examination; on-going bowel function - Pain control prn; antiemetics prn -Medical management of comorbidities; home medications - Mobilization encouraged as feasible;PT following; recommending HHPT and OT; orders placed - DVT prophylaxis; hold in setting of epistaxis   - Discharge Planning: awaiting control of epistaxis, likely DC tomorrow  All of the above findings and recommendations were discussed with the patient, and the medical team, and all of patient's questions were answered to his expressed satisfaction.  -- , PA-C [facility] 05/22/2020, 1:03 PM [phone] M-F: 7am - 4pm

## 2020-05-22 NOTE — Care Management Important Message (Signed)
Important Message  Patient Details  Name: [name] MRN: [id number] Date of Birth: 22-Jun-1971   Medicare Important Message Given:  Yes  Spoke with the legal guardian, [name] by phone [phone] and she is in agreement with the discharge plan and understood the patient rights as stated in the Important Message from Medicare.  Stated she talked with CSW and asked that she contact the patients brother for transporation home and was aware Home Health has been arranged at discharge. Asks that MD check patient before discharge as patient told her of the nose bleed and that was packed with ice around his face. Her  concern was to make sure he did not have a brain bleed.  Olegario Messier A Shahrukh Pasch 05/22/2020, 3:05 PM

## 2020-05-22 NOTE — TOC Transition Note (Addendum)
Transition of Care [facility]) - CM/SW Discharge Note   Patient Details  Name: Derrick Atkins MRN: [id number] Date of Birth: June 06, 1971  [facility] CM/SW Contact:  [name], LCSW Phone Number: 05/22/2020, 11:23 AM   Clinical Narrative:      [facility] accepted patient for OT, PT, and Aide services. DME (3 in 1 and RW) ordered through [employer]. CSW updated patient's guardian, [name]. She reported patient's brother can provide transportation when patient is discharged if he is not at work.   3:50- Patient's Mother [name] [phone]) requested a call and patient gave CSW permission to call [name]. [name] reported patient is followed by the ACT Team. She reported she wanted patient to be placed in SNF or ALF. Explained that PT is no longer recommending SNF and ALF is something that takes a while especially with Medicaid. Asked if she would like CSW to see about adding a SW to help with patient's home environment and possible ALF placement through Home Health. CSW reached out to [facility] [employer] and asked him to add SW if able.    Final next level of care: Home w Home Health Services Barriers to Discharge: Barriers Resolved   Patient Goals and CMS Choice Patient states their goals for this hospitalization and ongoing recovery are:: home with home health CMS Medicare.gov Compare Post Acute Care list provided to:: Patient Represenative (must comment) Choice offered to / list presented to : [facility] POA / Guardian  Discharge Placement                Patient to be transferred to facility by: brother, [name] Name of family member notified: guardian, [name] Patient and family notified of of transfer: 05/22/20  Discharge Plan and Services                DME Arranged: Dan Humphreys rolling, 3-N-1 DME Agency: [facility] Date DME Agency Contacted: 05/21/20   Representative spoke with at DME Agency: Santina Evans HH Arranged: PT, OT,  Nurse's Aide HH Agency: [facility] Date HH Agency Contacted: 05/22/20   Representative spoke with at [facility] Agency: Feliberto Gottron  Social Determinants of Health (SDOH) Interventions     Readmission Risk Interventions No flowsheet data found.

## 2020-05-22 NOTE — Progress Notes (Signed)
Pt was able to ambulate on the hallway for about 7 minutes with no difficulties

## 2020-05-23 LAB — GLUCOSE, CAPILLARY
Glucose-Capillary: 128 mg/dL — ABNORMAL HIGH (ref 70–99)
Glucose-Capillary: 186 mg/dL — ABNORMAL HIGH (ref 70–99)

## 2020-05-23 MED ORDER — OXYCODONE HCL 5 MG PO TABS: 5.0000 mg | ORAL_TABLET | Freq: Four times a day (QID) | ORAL | 0 refills | Status: DC | PRN

## 2020-05-23 MED ORDER — POLYETHYLENE GLYCOL 3350 17 G PO PACK
17.0000 g | PACK | Freq: Every day | ORAL | 0 refills | Status: DC
Start: 2020-05-23 — End: 2022-02-24

## 2020-05-23 MED ORDER — IBUPROFEN 800 MG PO TABS: 800.0000 mg | ORAL_TABLET | Freq: Three times a day (TID) | ORAL | 0 refills | Status: DC | PRN

## 2020-05-23 MED ORDER — OXYMETAZOLINE HCL 0.05 % NA SOLN
1.0000 | Freq: Two times a day (BID) | NASAL | 0 refills | Status: DC
Start: 2020-05-23 — End: 2020-06-25

## 2020-05-23 MED ORDER — DOCUSATE SODIUM 250 MG PO CAPS: 250.0000 mg | ORAL_CAPSULE | Freq: Every day | ORAL | 0 refills | Status: DC

## 2020-05-23 NOTE — Progress Notes (Signed)
Pt taken OOF via w/c for discharge in stable condition. Pt able to verbalize understanding of all discharge instructions via teach back method. Pt given DME.

## 2020-05-23 NOTE — TOC Transition Note (Signed)
Transition of Care [facility]) - CM/SW Discharge Note   Patient Details  Name: Derrick Atkins MRN: [id number] Date of Birth: [birth year]  Transition of Care [facility]) CM/SW Contact:  [name], LCSW Phone Number: 05/23/2020, 2:55 PM   Clinical Narrative:   [facility] with [facility], OT, SW, and Aide services. [facility] already delivered 3 in 1 and RW to room. CSW spoke with patient at bedside and provided list of food resources as well as general [employer]. Patient denied additional needs at this time. Patient's family member Hope will provide transportation home today.    Final next level of care: Home w Home Health Services Barriers to Discharge: Barriers Resolved   Patient Goals and CMS Choice Patient states their goals for this hospitalization and ongoing recovery are:: home with home health CMS Medicare.gov Compare Post Acute Care list provided to:: Patient Represenative (must comment) Choice offered to / list presented to : [facility] POA / Guardian, Patient  Discharge Placement                Patient to be transferred to facility by: Hope Name of family member notified: legal guardian and patient Patient and family notified of of transfer: 05/23/20  Discharge Plan and Services                DME Arranged: Dan Humphreys rolling, 3-N-1 DME Agency: [facility] Date DME Agency Contacted: 05/23/20   Representative spoke with at DME Agency: Santina Evans [facility] Arranged: PT, OT, Nurse's Aide, Social Work [employer] Agency: [facility] Date HH Agency Contacted: 05/23/20   Representative spoke with at [facility] Agency: Feliberto Gottron  Social Determinants of Health (SDOH) Interventions     Readmission Risk Interventions No flowsheet data found.

## 2020-05-23 NOTE — Discharge Summary (Signed)
[facility] SUR[facility]ICAL DISCHARGE SUMMARY)  Patient ID: Derrick Atkins MRN: [id number] DOB/AGE: Jun 22, 1971 49 y.o.  Admit date: 05/13/2020 Discharge date: 05/23/2020  Discharge Diagnoses Patient Active Problem List   Diagnosis Date Noted  . Small bowel obstruction (HCC) 05/13/2020    Consultants None  Procedures 05/15/2020:  Exploratory Laparotomy and extensive lysis of adhesions of > 90 minutes; debridement of skin and subcutaneous tissue for 30 cm 2   HPI: 49 y.o. male presented to [facility] ED overnight for abdominal pain. Patient reports around a 4 day history of generalized abdominal soreness, distension, intermittent nausea, non-bloody emesis, decreased PO intake, and decline in bowel function. He reports these symptoms have been progressively worsening. Eating seems to exacerbate these symptoms and induces vomiting. No fever, chill, urinary changes. He does not endorse a history of similar in the past. 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 Unsure who preformed this. Work up in the ED was concerning for slight bump in sCr to 1.09 from baseline, mild hyponatremia to 134, and CT Abdomen/Pelvs was concerning for small bowel obstruction.    Hospital Course: Patient was admitted to the general surgery service and underwent conservative measure with NGT decompression. He failed to show any clinical progress and underwent gastrografin challenge on HD1-2. Unfortunately, he failed this. Informed consent was obtained and documented, and patient underwent uneventful exploratory laparotomy and lysis of adhesions (Dr Aleen Campi, 05/15/2020).  Post-operatively, patient did require continued NGT decompression and PICC was placed for TPN. NGT was removed on POD3. Advancement of patient's diet and ambulation with therapies were well-tolerated. He did develop epistaxis on POD7 which delayed his discharge. The remainder of patient's hospital  course was essentially unremarkable, and discharge planning was initiated accordingly with patient safely able to be discharged home with appropriate discharge instructions, pain control, and outpatient follow-up after all of his questions were answered to his expressed satisfaction.   Discharge Condition: Good   Physical Examination:  Constitutional: alert, cooperative and no distress Respiratory: breathing non-labored at rest Cardiovascular: regular rate and sinus rhythm  Gastrointestinal:Soft,no appreciable soreness, non-distended, no rebound/guarding Integumentary:Laparotomy incision is CDI, some early ecchymosis, no drainage or erythema   Allergies as of 05/23/2020      Reactions   Latex Hives   Prednisone       Medication List    TAKE these medications   ARIPiprazole 20 MG tablet Commonly known as: ABILIFY Take 1 tablet by mouth daily.   aspirin 81 MG EC tablet Take 1 tablet by mouth daily.   buPROPion 300 MG 24 hr tablet Commonly known as: WELLBUTRIN XL Take 300 mg by mouth daily.   divalproex 500 MG DR tablet Commonly known as: DEPAKOTE Take 1,000 mg by mouth 2 (two) times daily.   docusate sodium 250 MG capsule Commonly known as: COLACE Take 1 capsule (250 mg total) by mouth daily.   gemfibrozil 600 MG tablet Commonly known as: LOPID Take 600 mg by mouth 2 (two) times daily.   ibuprofen 800 MG tablet Commonly known as: ADVIL Take 1 tablet (800 mg total) by mouth every 8 (eight) hours as needed.   levothyroxine 88 MCG tablet Commonly known as: SYNTHROID Take 1 tablet by mouth daily.   lidocaine 5 % Commonly known as: Lidoderm Place 1 patch onto the skin every 12 (twelve) hours. Remove & Discard patch within 12 hours or as directed by MD   lisinopril 10 MG tablet Commonly known as: ZESTRIL Take 1 tablet by  mouth daily.   metFORMIN 1000 MG tablet Commonly known as: GLUCOPHAGE Take 1 tablet by mouth 2 (two) times daily with a meal.   naproxen  500 MG tablet Commonly known as: Naprosyn Take 1 tablet (500 mg total) by mouth 2 (two) times daily with a meal.   NovoLOG Mix 70/30 FlexPen (70-30) 100 UNIT/ML FlexPen Generic drug: insulin aspart protamine - aspart Inject 34-44 Units into the skin 2 (two) times daily. 34 units-am 44units-pm   oxyCODONE 5 MG immediate release tablet Commonly known as: Oxy IR/ROXICODONE Take 1 tablet (5 mg total) by mouth every 6 (six) hours as needed for severe pain.   oxymetazoline 0.05 % nasal spray Commonly known as: AFRIN Place 1 spray into both nostrils 2 (two) times daily. For nose bleeds   pioglitazone 45 MG tablet Commonly known as: ACTOS Take 1 tablet by mouth daily.   polyethylene glycol 17 g packet Commonly known as: MIRALAX / GLYCOLAX Take 17 g by mouth daily.            Durable Medical Equipment  (From admission, onward)         Start     Ordered   05/22/20 0748  For home use only DME 3 n 1  Once        05/22/20 0747   05/21/20 1558  For home use only DME Walker rolling  Once       Question Answer Comment  Walker: With 5 Inch Wheels   Patient needs a walker to treat with the following condition Physical deconditioning      05/21/20 1557            Follow-up Information    Piscoya, Elita Quick, MD. Schedule an appointment as soon as possible for a visit in 2 week(s).   Specialty: General Surgery Why: s/p ex lap  Contact information: [address] [place] [address] [phone]                Time spent on discharge management including discussion of hospital course, clinical condition, outpatient instructions, prescriptions, and follow up with the patient and members of the medical team: >30 minutes  -- Lynden Oxford , PA-C [facility]  05/23/2020, 8:50 AM [phone] M-F: 7am - 4pm

## 2020-06-04 ENCOUNTER — Encounter: Payer: Self-pay | Admitting: Surgery

## 2020-06-04 ENCOUNTER — Other Ambulatory Visit: Payer: Self-pay

## 2020-06-04 ENCOUNTER — Ambulatory Visit (INDEPENDENT_AMBULATORY_CARE_PROVIDER_SITE_OTHER): Payer: Medicare Other | Admitting: Physician Assistant

## 2020-06-04 VITALS — BP 124/85 | HR 84 | Temp 97.6°F | Ht 76.0 in | Wt 251.0 lb

## 2020-06-04 DIAGNOSIS — Z09 Encounter for follow-up examination after completed treatment for conditions other than malignant neoplasm: Secondary | ICD-10-CM

## 2020-06-04 DIAGNOSIS — K56609 Unspecified intestinal obstruction, unspecified as to partial versus complete obstruction: Secondary | ICD-10-CM

## 2020-06-04 MED ORDER — OXYCODONE HCL 5 MG PO TABS: 5.0000 mg | ORAL_TABLET | Freq: Four times a day (QID) | ORAL | 0 refills | Status: DC | PRN

## 2020-06-04 MED ORDER — CYCLOBENZAPRINE HCL 5 MG PO TABS: 5.0000 mg | ORAL_TABLET | Freq: Every day | ORAL | 0 refills | Status: DC

## 2020-06-04 NOTE — Progress Notes (Signed)
[facility] POST-OP OFFICE VISIT  06/04/2020  HPI: Derrick Atkins is a 49 y.o. male 20 days s/p exploratory laparotomy and lysis of adhesions for SBO with Dr Aleen Campi  He reports that he is doing well He has abdominal soreness, worse after moving around all day, no overt pain reported He otherwise reports that he is tolerating PO without issues, no fever, chills, nausea, emesis.  He is having normal bowel function Incision continues to heal well without issues or concerns.   Vital signs: BP 124/85   Pulse 84   Temp 97.6 F (36.4 C) (Oral)   Ht 6\' 4"  (1.93 m)   Wt 251 lb (113.9 kg)   SpO2 98%   BMI 30.55 kg/m    Physical Exam: Constitutional: Well appearing male, NAD Abdomen: Soft, diffuse soreness with palpation, no point tenderness, non-distended, no rebound/guarding Skin: Laparotomy incision has continued to heal well, dermabond is falling off, no erythema or drainage  Assessment/Plan: This is a 49 y.o. male 20 days s/p exploratory laparotomy and lysis of adhesions for SBO   - Pain control prn   - I will add 5mg  Flexeril QHS to aid in muscle soreness/spasm   - I will provide a short refill of oxycodone; he understands we need to work off of this medication   - Continue tylenol, ibuprofen, ice prn  - Reviewed wound care instructions  - Reviewed lifting restrictions; recommend 6 weeks total  - He will rtc in 2 weeks for re-check   -- 53, PA-C [facility] 06/04/2020, 11:01 AM [phone] M-F: 7am - 4pm

## 2020-06-04 NOTE — Patient Instructions (Addendum)
Pick your medication up today at the pharmacy.     GENERAL POST-OPERATIVE PATIENT INSTRUCTIONS   WOUND CARE INSTRUCTIONS:  Keep a dry clean dressing on the wound if there is drainage. The initial bandage may be removed after 24 hours.  Once the wound has quit draining you may leave it open to air.  If clothing rubs against the wound or causes irritation and the wound is not draining you may cover it with a dry dressing during the daytime.  Try to keep the wound dry and avoid ointments on the wound unless directed to do so.  If the wound becomes bright red and painful or starts to drain infected material that is not clear, please contact your physician immediately.  If the wound is mildly pink and has a thick firm ridge underneath it, this is normal, and is referred to as a healing ridge.  This will resolve over the next 4-6 weeks.  BATHING: You may shower if you have been informed of this by your surgeon. However, Please do not submerge in a tub, hot tub, or pool until incisions are completely sealed or have been told by your surgeon that you may do so.  DIET:  You may eat any foods that you can tolerate.  It is a good idea to eat a high fiber diet and take in plenty of fluids to prevent constipation.  If you do become constipated you may want to take a mild laxative or take ducolax tablets on a daily basis until your bowel habits are regular.  Constipation can be very uncomfortable, along with straining, after recent surgery.  ACTIVITY:  You are encouraged to cough and deep breath or use your incentive spirometer if you were given one, every 15-30 minutes when awake.  This will help prevent respiratory complications and low grade fevers post-operatively if you had a general anesthetic.  You may want to hug a pillow when coughing and sneezing to add additional support to the surgical area, if you had abdominal or chest surgery, which will decrease pain during these times.  You are encouraged to walk  and engage in light activity for the next two weeks.  You should not lift more than 20 pounds, until 06/26/20 as it could put you at increased risk for complications.  Twenty pounds is roughly equivalent to a plastic bag of groceries. At that time- Listen to your body when lifting, if you have pain when lifting, stop and then try again in a few days. Soreness after doing exercises or activities of daily living is normal as you get back in to your normal routine.  MEDICATIONS:  Try to take narcotic medications and anti-inflammatory medications, such as tylenol, ibuprofen, naprosyn, etc., with food.  This will minimize stomach upset from the medication.  Should you develop nausea and vomiting from the pain medication, or develop a rash, please discontinue the medication and contact your physician.  You should not drive, make important decisions, or operate machinery when taking narcotic pain medication.  SUNBLOCK Use sun block to incision area over the next year if this area will be exposed to sun. This helps decrease scarring and will allow you avoid a permanent darkened area over your incision.  QUESTIONS:  Please feel free to call our office if you have any questions, and we will be glad to assist you. [phone]

## 2020-06-20 ENCOUNTER — Encounter: Payer: Medicare Other | Admitting: Surgery

## 2020-06-25 ENCOUNTER — Ambulatory Visit (INDEPENDENT_AMBULATORY_CARE_PROVIDER_SITE_OTHER): Payer: Medicare Other | Admitting: Surgery

## 2020-06-25 ENCOUNTER — Other Ambulatory Visit: Payer: Self-pay

## 2020-06-25 ENCOUNTER — Encounter: Payer: Self-pay | Admitting: Surgery

## 2020-06-25 VITALS — BP 122/74 | HR 92 | Temp 97.9°F | Ht 76.0 in | Wt 253.2 lb

## 2020-06-25 DIAGNOSIS — K56609 Unspecified intestinal obstruction, unspecified as to partial versus complete obstruction: Secondary | ICD-10-CM

## 2020-06-25 DIAGNOSIS — Z09 Encounter for follow-up examination after completed treatment for conditions other than malignant neoplasm: Secondary | ICD-10-CM

## 2020-06-25 NOTE — Progress Notes (Signed)
06/25/2020  HPI: Derrick Atkins is a 49 y.o. male s/p exploratory laparotomy and lysis of adhesions for small bowel obstruction on 05/15/20.  He presents today for follow up.  From the surgical standpoint, he's been doing well, eating well, without any further pain, without any nausea or vomiting, and having good bowel function.  Unfortunately, his mother passed away on 07/13/2023 and they've been setting up viewing and services for her.  Vital signs: BP 122/74   Pulse 92   Temp 97.9 F (36.6 C) (Oral)   Ht 6\' 4"  (1.93 m)   Wt 253 lb 3.2 oz (114.9 kg)   SpO2 99%   BMI 30.82 kg/m    Physical Exam: Constitutional:  No acute distress, but grieving. Abdomen: Soft, non-distended, non-tender to palpation.  Midline incision is healing very well, without any evidence of infection or breakdown.  No incisional hernias.  Assessment/Plan: This is a 49 y.o. male s/p exlap and LOA  --Patient healing well from surgical standpoint without any noticeable issues or complications.  Encouraged him to take Miralax daily or QOD to help keep him regular going forwards. --Follow up prn.   53, MD [facility]

## 2020-06-25 NOTE — Patient Instructions (Signed)
Continue to take Miralax daily. This can be purchased over the counter at the drug store or [facility]. If your bowels become watery you may need to take it every other day to avoid diarrhea.   Please call our office if you have any questions or concerns.

## 2021-06-16 ENCOUNTER — Other Ambulatory Visit: Payer: Self-pay

## 2021-06-16 ENCOUNTER — Emergency Department
Admission: EM | Admit: 2021-06-16 | Discharge: 2021-06-16 | Disposition: A | Discharge status: Home / Self Care | Payer: Medicare Other | Attending: Emergency Medicine | Admitting: Emergency Medicine

## 2021-06-16 DIAGNOSIS — R079 Chest pain, unspecified: Secondary | ICD-10-CM

## 2021-06-16 DIAGNOSIS — Z7982 Long term (current) use of aspirin: Secondary | ICD-10-CM | POA: Diagnosis not present

## 2021-06-16 DIAGNOSIS — Z79899 Other long term (current) drug therapy: Secondary | ICD-10-CM | POA: Diagnosis not present

## 2021-06-16 DIAGNOSIS — R42 Dizziness and giddiness: Secondary | ICD-10-CM | POA: Insufficient documentation

## 2021-06-16 DIAGNOSIS — I10 Essential (primary) hypertension: Secondary | ICD-10-CM | POA: Insufficient documentation

## 2021-06-16 DIAGNOSIS — Z7984 Long term (current) use of oral hypoglycemic drugs: Secondary | ICD-10-CM | POA: Insufficient documentation

## 2021-06-16 DIAGNOSIS — R739 Hyperglycemia, unspecified: Secondary | ICD-10-CM

## 2021-06-16 DIAGNOSIS — E039 Hypothyroidism, unspecified: Secondary | ICD-10-CM | POA: Diagnosis not present

## 2021-06-16 DIAGNOSIS — E860 Dehydration: Secondary | ICD-10-CM

## 2021-06-16 DIAGNOSIS — F419 Anxiety disorder, unspecified: Secondary | ICD-10-CM | POA: Diagnosis not present

## 2021-06-16 DIAGNOSIS — E1165 Type 2 diabetes mellitus with hyperglycemia: Secondary | ICD-10-CM | POA: Diagnosis not present

## 2021-06-16 DIAGNOSIS — Z91040 Latex allergy status: Secondary | ICD-10-CM | POA: Insufficient documentation

## 2021-06-16 LAB — URINALYSIS, ROUTINE W REFLEX MICROSCOPIC
Bacteria, UA: NONE SEEN
Bilirubin Urine: NEGATIVE
Glucose, UA: >=500 mg/dL — AB
Hgb urine dipstick: NEGATIVE
Ketones, ur: 5 mg/dL — AB
Leukocytes,Ua: NEGATIVE
Nitrite: NEGATIVE
Protein, ur: NEGATIVE mg/dL
Specific Gravity, Urine: 1.025 (ref 1.005–1.030)
Squamous Epithelial / HPF: NONE SEEN (ref 0–5)
pH: 5 (ref 5.0–8.0)

## 2021-06-16 LAB — CBC
HCT: 39 % (ref 39.0–52.0)
Hemoglobin: 12.6 g/dL — ABNORMAL LOW (ref 13.0–17.0)
MCH: 27.5 pg (ref 26.0–34.0)
MCHC: 32.3 g/dL (ref 30.0–36.0)
MCV: 85.2 fL (ref 80.0–100.0)
Platelets: 160 10*3/uL (ref 150–400)
RBC: 4.58 MIL/uL (ref 4.22–5.81)
RDW: 14.3 % (ref 11.5–15.5)
WBC: 5.8 10*3/uL (ref 4.0–10.5)
nRBC: 0 % (ref 0.0–0.2)

## 2021-06-16 LAB — BASIC METABOLIC PANEL
Anion gap: 9 (ref 5–15)
BUN: 20 mg/dL (ref 6–20)
CO2: 23 mmol/L (ref 22–32)
Calcium: 9.2 mg/dL (ref 8.9–10.3)
Chloride: 101 mmol/L (ref 98–111)
Creatinine, Ser: 0.87 mg/dL (ref 0.61–1.24)
GFR, Estimated: >60 mL/min (ref >60)
Glucose, Bld: 331 mg/dL — ABNORMAL HIGH (ref 70–99)
Potassium: 4.2 mmol/L (ref 3.5–5.1)
Sodium: 133 mmol/L — ABNORMAL LOW (ref 135–145)

## 2021-06-16 LAB — TROPONIN I (HIGH SENSITIVITY): Troponin I (High Sensitivity): 5 ng/L (ref <18)

## 2021-06-16 MED ORDER — SODIUM CHLORIDE 0.9 % IV BOLUS
1000.0000 mL | Freq: Once | INTRAVENOUS | Status: AC
  Administered 2021-06-16: 1000 mL via INTRAVENOUS

## 2021-06-16 MED ORDER — LORAZEPAM 2 MG/ML IJ SOLN
0.5000 mg | Freq: Once | INTRAMUSCULAR | Status: AC
  Administered 2021-06-16: 0.5 mg via INTRAVENOUS
  Filled 2021-06-16: qty 1

## 2021-06-16 NOTE — ED Triage Notes (Addendum)
Pt comes with c/o near syncopal, hypertension and hyperglycemia. Pt states he just feels real weak and dizzy.  Pt denies any pain. Pt states sugars have been elevated in 300s.

## 2021-06-16 NOTE — Discharge Instructions (Addendum)
Please drink plenty fluids over the next several days and obtain plenty of rest.  Please follow-up with your doctor in the next 2 to 3 days for recheck/reevaluation.  Return to the emergency department for any symptom personally concerning to yourself.

## 2021-06-16 NOTE — ED Provider Notes (Signed)
[facility] Emergency Department Provider Note  Time seen: 8:59 PM  I have reviewed the triage vital signs and the nursing notes.   HISTORY  Chief Complaint Near Syncope   HPI Derrick Atkins is a 50 y.o. male with a past medical history of cerebral palsy, diabetes, hypertension, schizoaffective disorder, presents emergency department for dizziness and elevated blood sugars.  According to the the patient he has been under a lot of stress lately and has been very anxious feeling.  Patient states he has not been able to sleep recently because of the anxiety.  States earlier today he was having some chest pain but that is since resolved.  Patient states he went to his doctor today and was found to have elevated blood glucose so he was referred to the emergency department for further evaluation.   Past Medical History:  Diagnosis Date   Cerebral palsy (HCC)    Depression    Diabetes mellitus without complication (HCC)    Hypertension    Kidney stones    Schizoaffective disorder [facility])    Scoliosis     Patient Active Problem List   Diagnosis Date Noted   Small bowel obstruction (HCC) 05/13/2020   Chronic low back pain without sciatica 12/09/2017   History of scoliosis 12/09/2017   Schizophrenia, paranoid, chronic (HCC)    Long QT interval 04/16/2014   Hypertriglyceridemia 04/02/2014   Long-term insulin use (HCC) 04/02/2014   Iron deficiency anemia, unspecified 12/31/2013   Hypothyroidism 12/25/2013   DIABETES MELLITUS 05/03/2007   SCHIZOPHRENIA 05/03/2007   HYPERTENSION 05/03/2007   PNEUMONITIS DUE TO FOOD/VOMIT INHALATION 05/03/2007   PECTUS EXCAVATUM 05/03/2007   Type 2 diabetes mellitus without complications (HCC) 05/03/2007    Past Surgical History:  Procedure Laterality Date   BOWEL RESECTION     LAPAROTOMY N/A 05/15/2020   Procedure: EXPLORATORY LAPAROTOMY;  Surgeon: Henrene Dodge, MD;  Location: [facility] ORS;  Service: General;  Laterality: N/A;     Prior to Admission medications   Medication Sig Start Date End Date Taking? Authorizing Provider  ARIPiprazole (ABILIFY) 20 MG tablet Take 1 tablet by mouth daily.  07/03/15   [provider]  aspirin 81 MG EC tablet Take 1 tablet by mouth daily.    [provider]  buPROPion (WELLBUTRIN XL) 300 MG 24 hr tablet Take 300 mg by mouth daily. 05/06/20   [provider]  cyclobenzaprine (FLEXERIL) 5 MG tablet Take 1 tablet (5 mg total) by mouth at bedtime. 06/04/20   Donovan Kail, PA-C  divalproex (DEPAKOTE) 500 MG DR tablet Take 1,000 mg by mouth 2 (two) times daily.  07/03/15   [provider]  gemfibrozil (LOPID) 600 MG tablet Take 600 mg by mouth 2 (two) times daily. 05/12/20   [provider]  levothyroxine (SYNTHROID, LEVOTHROID) 88 MCG tablet Take 1 tablet by mouth daily. 07/03/15   [provider]  lidocaine (LIDODERM) 5 % Place 1 patch onto the skin every 12 (twelve) hours. Remove & Discard patch within 12 hours or as directed by MD 04/07/16   Sharman Cheek, MD  lisinopril (PRINIVIL,ZESTRIL) 10 MG tablet Take 1 tablet by mouth daily. 07/03/15   [provider]  metFORMIN (GLUCOPHAGE) 1000 MG tablet Take 1 tablet by mouth 2 (two) times daily with a meal.  07/03/15   [provider]  naproxen (NAPROSYN) 500 MG tablet Take 1 tablet (500 mg total) by mouth 2 (two) times daily with a meal. 04/07/16   Sharman Cheek,  MD  NOVOLOG MIX 70/30 FLEXPEN (70-30) 100 UNIT/ML FlexPen Inject 34-44 Units into the skin 2 (two) times daily. 34 units-am 44units-pm 07/01/15   [provider]  pioglitazone (ACTOS) 45 MG tablet Take 1 tablet by mouth daily.  07/03/15   [provider]  polyethylene glycol (MIRALAX / GLYCOLAX) 17 g packet Take 17 g by mouth daily. 05/23/20   Donovan Kail, PA-C    Allergies  Allergen Reactions   Phenytoin Sodium Extended Other (See Comments) and Nausea And Vomiting    Prednisone Other (See Comments)   Latex Hives, Nausea And Vomiting and Rash    Family History  Problem Relation Age of Onset   Heart disease Father    Heart attack Father     Social History Social History   Tobacco Use   Smoking status: Never   Smokeless tobacco: Never  Substance Use Topics   Alcohol use: No   Drug use: No    Review of Systems Constitutional: Negative for fever.  Positive for anxiety.  Positive for dizziness. Cardiovascular: Mild chest pain earlier today. Respiratory: Negative for shortness of breath. Gastrointestinal: Negative for abdominal pain, vomiting and diarrhea. Genitourinary: Negative for urinary compaints Musculoskeletal: Negative for musculoskeletal complaints Neurological: Negative for headache All other ROS negative  ____________________________________________   PHYSICAL EXAM:  VITAL SIGNS: ED Triage Vitals  Enc Vitals Group     BP 06/16/21 1708 (!) 147/74     Pulse Rate 06/16/21 1708 79     Resp 06/16/21 1708 18     Temp 06/16/21 1708 97.7 F (36.5 C)     Temp Source 06/16/21 1708 Oral     SpO2 06/16/21 1708 97 %     Weight --      Height --      Head Circumference --      Peak Flow --      Pain Score 06/16/21 1704 0     Pain Loc --      Pain Edu? --      Excl. in GC? --    Constitutional: Alert and oriented. Well appearing and in no distress. Eyes: Normal exam ENT      Head: Normocephalic and atraumatic.      Mouth/Throat: Mucous membranes are moist. Cardiovascular: Normal rate, regular rhythm Respiratory: Normal respiratory effort without tachypnea nor retractions. Breath sounds are clear and equal bilaterally. No wheezes/rales/rhonchi. Gastrointestinal: Soft and nontender. No distention.  Musculoskeletal: Nontender with normal range of motion in all extremities.  Neurologic:  Normal speech and language. No gross focal neurologic deficits  Skin:  Skin is warm, dry and intact.  Psychiatric: Mood and affect are normal.    ____________________________________________    EKG  EKG viewed and interpreted by myself shows a normal sinus rhythm at 86 bpm with a narrow QRS, normal axis, normal intervals, no concerning ST changes.  ____________________________________________   INITIAL IMPRESSION / ASSESSMENT AND PLAN / ED COURSE  Pertinent labs & imaging results that were available during my care of the patient were reviewed by me and considered in my medical decision making (see chart for details).   Patient presents to the emergency department for multiple complaints.  States he has been feeling very anxious recently, states chest pain earlier today states dizziness this evening and was found to have an elevated blood glucose earlier today by his doctor so he came to the emergency department for evaluation.  Here the patient appears well he is somewhat anxious in appearance.  Patient's  blood sugar is elevated to 331.  We will dose IV fluids, small amount of IV Ativan.  Given the patient's complaint of chest pain earlier today we will obtain an EKG as well as a troponin as a precaution.  Overall patient appears well the reassuring physical exam and reassuring vital signs.  Troponin negative.  EKG reassuring.  Patient feeling better with fluids and Ativan.  Given the patient's reassuring work-up I believe he is safe for discharge home with PCP follow-up.  Patient agreeable to plan of care.  Derrick Atkins was evaluated in Emergency Department on 06/16/2021 for the symptoms described in the history of present illness. He was evaluated in the context of the global COVID-19 pandemic, which necessitated consideration that the patient might be at risk for infection with the SARS-CoV-2 virus that causes COVID-19. Institutional protocols and algorithms that pertain to the evaluation of patients at risk for COVID-19 are in a state of rapid change based on information released by regulatory bodies including the CDC and federal  and state organizations. These policies and algorithms were followed during the patient's care in the ED.  ____________________________________________   FINAL CLINICAL IMPRESSION(S) / ED DIAGNOSES  Dizziness Hyperglycemia Anxiety   Minna Antis, MD 06/16/21 2236

## 2021-06-16 NOTE — ED Notes (Signed)
Pt legal gaurdian contact unsucessful. Pt Aunt successful. Aunt contacted the patients friend who is a Chartered loss adjuster and drives a white pick-up. Will be here in approx .

## 2021-08-30 ENCOUNTER — Emergency Department: Payer: Medicare Other

## 2021-08-30 ENCOUNTER — Emergency Department
Admission: EM | Admit: 2021-08-30 | Discharge: 2021-09-01 | Disposition: A | Discharge status: Skilled Nursing Facility | Payer: Medicare Other | Source: Home / Self Care | Attending: Emergency Medicine | Admitting: Emergency Medicine

## 2021-08-30 ENCOUNTER — Encounter: Payer: Self-pay | Admitting: Emergency Medicine

## 2021-08-30 ENCOUNTER — Other Ambulatory Visit: Payer: Self-pay

## 2021-08-30 ENCOUNTER — Emergency Department
Admission: EM | Admit: 2021-08-30 | Discharge: 2021-08-30 | Disposition: A | Discharge status: Home / Self Care | Payer: Medicare Other | Attending: Emergency Medicine | Admitting: Emergency Medicine

## 2021-08-30 DIAGNOSIS — E039 Hypothyroidism, unspecified: Secondary | ICD-10-CM | POA: Insufficient documentation

## 2021-08-30 DIAGNOSIS — I10 Essential (primary) hypertension: Secondary | ICD-10-CM | POA: Insufficient documentation

## 2021-08-30 DIAGNOSIS — M549 Dorsalgia, unspecified: Secondary | ICD-10-CM | POA: Insufficient documentation

## 2021-08-30 DIAGNOSIS — R600 Localized edema: Secondary | ICD-10-CM

## 2021-08-30 DIAGNOSIS — E119 Type 2 diabetes mellitus without complications: Secondary | ICD-10-CM | POA: Insufficient documentation

## 2021-08-30 DIAGNOSIS — M25571 Pain in right ankle and joints of right foot: Secondary | ICD-10-CM | POA: Insufficient documentation

## 2021-08-30 DIAGNOSIS — Z20822 Contact with and (suspected) exposure to covid-19: Secondary | ICD-10-CM | POA: Insufficient documentation

## 2021-08-30 DIAGNOSIS — G8929 Other chronic pain: Secondary | ICD-10-CM | POA: Insufficient documentation

## 2021-08-30 DIAGNOSIS — Z794 Long term (current) use of insulin: Secondary | ICD-10-CM | POA: Diagnosis not present

## 2021-08-30 DIAGNOSIS — Z79899 Other long term (current) drug therapy: Secondary | ICD-10-CM | POA: Diagnosis not present

## 2021-08-30 DIAGNOSIS — Z7984 Long term (current) use of oral hypoglycemic drugs: Secondary | ICD-10-CM | POA: Insufficient documentation

## 2021-08-30 DIAGNOSIS — Z7982 Long term (current) use of aspirin: Secondary | ICD-10-CM | POA: Diagnosis not present

## 2021-08-30 LAB — CBC WITH DIFFERENTIAL/PLATELET
Abs Immature Granulocytes: 0.04 10*3/uL (ref 0.00–0.07)
Basophils Absolute: 0 10*3/uL (ref 0.0–0.1)
Basophils Relative: 1 %
Eosinophils Absolute: 0.2 10*3/uL (ref 0.0–0.5)
Eosinophils Relative: 2 %
HCT: 40.5 % (ref 39.0–52.0)
Hemoglobin: 13.1 g/dL (ref 13.0–17.0)
Immature Granulocytes: 1 %
Lymphocytes Relative: 27 %
Lymphs Abs: 1.8 10*3/uL (ref 0.7–4.0)
MCH: 27.1 pg (ref 26.0–34.0)
MCHC: 32.3 g/dL (ref 30.0–36.0)
MCV: 83.9 fL (ref 80.0–100.0)
Monocytes Absolute: 0.5 10*3/uL (ref 0.1–1.0)
Monocytes Relative: 8 %
Neutro Abs: 4.1 10*3/uL (ref 1.7–7.7)
Neutrophils Relative %: 61 %
Platelets: 205 10*3/uL (ref 150–400)
RBC: 4.83 MIL/uL (ref 4.22–5.81)
RDW: 14.7 % (ref 11.5–15.5)
WBC: 6.7 10*3/uL (ref 4.0–10.5)
nRBC: 0 % (ref 0.0–0.2)

## 2021-08-30 LAB — COMPREHENSIVE METABOLIC PANEL
ALT: 18 U/L (ref 0–44)
AST: 23 U/L (ref 15–41)
Albumin: 3.7 g/dL (ref 3.5–5.0)
Alkaline Phosphatase: 52 U/L (ref 38–126)
Anion gap: 12 (ref 5–15)
BUN: 25 mg/dL — ABNORMAL HIGH (ref 6–20)
CO2: 22 mmol/L (ref 22–32)
Calcium: 9.3 mg/dL (ref 8.9–10.3)
Chloride: 106 mmol/L (ref 98–111)
Creatinine, Ser: 1.19 mg/dL (ref 0.61–1.24)
GFR, Estimated: >60 mL/min (ref >60)
Glucose, Bld: 91 mg/dL (ref 70–99)
Potassium: 4 mmol/L (ref 3.5–5.1)
Sodium: 140 mmol/L (ref 135–145)
Total Bilirubin: 0.5 mg/dL (ref 0.3–1.2)
Total Protein: 6.6 g/dL (ref 6.5–8.1)

## 2021-08-30 LAB — CBG MONITORING, ED
Glucose-Capillary: 88 mg/dL (ref 70–99)
Glucose-Capillary: 103 mg/dL — ABNORMAL HIGH (ref 70–99)

## 2021-08-30 LAB — RESP PANEL BY RT-PCR (FLU A&B, COVID) ARPGX2
Influenza A by PCR: NEGATIVE
Influenza B by PCR: NEGATIVE
SARS Coronavirus 2 by RT PCR: NEGATIVE

## 2021-08-30 LAB — BRAIN NATRIURETIC PEPTIDE: B Natriuretic Peptide: 16.5 pg/mL (ref 0.0–100.0)

## 2021-08-30 MED ORDER — GEMFIBROZIL 600 MG PO TABS
600.0000 mg | ORAL_TABLET | Freq: Two times a day (BID) | ORAL | Status: DC
  Administered 2021-08-31 – 2021-09-01 (×3): 600 mg via ORAL
  Filled 2021-08-30 (×5): qty 1

## 2021-08-30 MED ORDER — LEVOTHYROXINE SODIUM 88 MCG PO TABS
88.0000 ug | ORAL_TABLET | Freq: Every day | ORAL | Status: DC
  Administered 2021-08-31: 88 ug via ORAL
  Filled 2021-08-30 (×4): qty 1

## 2021-08-30 MED ORDER — ASPIRIN EC 81 MG PO TBEC
81.0000 mg | DELAYED_RELEASE_TABLET | Freq: Every day | ORAL | Status: DC
  Administered 2021-08-30 – 2021-09-01 (×3): 81 mg via ORAL
  Filled 2021-08-30 (×3): qty 1

## 2021-08-30 MED ORDER — SIMVASTATIN 10 MG PO TABS
20.0000 mg | ORAL_TABLET | Freq: Every day | ORAL | Status: DC
  Administered 2021-08-30: 20 mg via ORAL
  Filled 2021-08-30: qty 2

## 2021-08-30 MED ORDER — LISINOPRIL 10 MG PO TABS: 10.0000 mg | ORAL_TABLET | Freq: Every day | ORAL | Status: DC

## 2021-08-30 MED ORDER — INSULIN ASPART PROT & ASPART (70-30 MIX) 100 UNIT/ML PEN: 34.0000 [IU] | PEN_INJECTOR | Freq: Two times a day (BID) | SUBCUTANEOUS | Status: DC

## 2021-08-30 MED ORDER — IBUPROFEN 600 MG PO TABS
600.0000 mg | ORAL_TABLET | Freq: Three times a day (TID) | ORAL | Status: DC | PRN
  Administered 2021-08-30: 600 mg via ORAL
  Filled 2021-08-30: qty 1

## 2021-08-30 MED ORDER — IBUPROFEN 800 MG PO TABS
800.0000 mg | ORAL_TABLET | Freq: Once | ORAL | Status: AC
  Administered 2021-08-30: 800 mg via ORAL
  Filled 2021-08-30: qty 1

## 2021-08-30 MED ORDER — METFORMIN HCL 500 MG PO TABS
1000.0000 mg | ORAL_TABLET | Freq: Two times a day (BID) | ORAL | Status: DC
  Administered 2021-08-30 – 2021-09-01 (×5): 1000 mg via ORAL
  Filled 2021-08-30 (×5): qty 2

## 2021-08-30 MED ORDER — BUPROPION HCL ER (XL) 150 MG PO TB24
300.0000 mg | ORAL_TABLET | Freq: Every day | ORAL | Status: DC
  Administered 2021-08-30 – 2021-09-01 (×3): 300 mg via ORAL
  Filled 2021-08-30 (×3): qty 2

## 2021-08-30 MED ORDER — DIVALPROEX SODIUM 500 MG PO DR TAB: 1000.0000 mg | DELAYED_RELEASE_TABLET | Freq: Two times a day (BID) | ORAL | Status: DC

## 2021-08-30 MED ORDER — CYCLOBENZAPRINE HCL 10 MG PO TABS: 5.0000 mg | ORAL_TABLET | Freq: Every day | ORAL | Status: DC

## 2021-08-30 MED ORDER — LIDOCAINE 5 % EX PTCH
1.0000 | MEDICATED_PATCH | CUTANEOUS | Status: DC
  Administered 2021-08-30 – 2021-09-01 (×3): 1 via TRANSDERMAL
  Filled 2021-08-30 (×3): qty 1

## 2021-08-30 MED ORDER — PIOGLITAZONE HCL 30 MG PO TABS
45.0000 mg | ORAL_TABLET | Freq: Every day | ORAL | Status: DC
  Administered 2021-08-31 – 2021-09-01 (×2): 45 mg via ORAL
  Filled 2021-08-30 (×2): qty 1

## 2021-08-30 MED ORDER — ARIPIPRAZOLE 10 MG PO TABS
20.0000 mg | ORAL_TABLET | Freq: Every day | ORAL | Status: DC
  Administered 2021-08-31 – 2021-09-01 (×2): 20 mg via ORAL
  Filled 2021-08-30 (×2): qty 2

## 2021-08-30 MED ORDER — INSULIN ASPART PROT & ASPART (70-30 MIX) 100 UNIT/ML PEN
34.0000 [IU] | PEN_INJECTOR | Freq: Every day | SUBCUTANEOUS | Status: DC
  Filled 2021-08-30: qty 3

## 2021-08-30 MED ORDER — LISINOPRIL 10 MG PO TABS
10.0000 mg | ORAL_TABLET | Freq: Every day | ORAL | Status: DC
  Administered 2021-08-31 – 2021-09-01 (×2): 10 mg via ORAL
  Filled 2021-08-30 (×2): qty 1

## 2021-08-30 MED ORDER — CYCLOBENZAPRINE HCL 10 MG PO TABS
5.0000 mg | ORAL_TABLET | Freq: Every day | ORAL | Status: DC
  Administered 2021-08-30 – 2021-08-31 (×2): 5 mg via ORAL
  Filled 2021-08-30 (×2): qty 1

## 2021-08-30 MED ORDER — DIVALPROEX SODIUM 500 MG PO DR TAB
1000.0000 mg | DELAYED_RELEASE_TABLET | Freq: Two times a day (BID) | ORAL | Status: DC
Start: 2021-08-30 — End: 2021-09-01
  Administered 2021-08-30 – 2021-09-01 (×4): 1000 mg via ORAL
  Filled 2021-08-30 (×5): qty 2

## 2021-08-30 MED ORDER — NAPROXEN 500 MG PO TABS
500.0000 mg | ORAL_TABLET | Freq: Two times a day (BID) | ORAL | Status: DC
  Administered 2021-08-30 – 2021-09-01 (×5): 500 mg via ORAL
  Filled 2021-08-30 (×5): qty 1

## 2021-08-30 MED ORDER — INSULIN ASPART PROT & ASPART (70-30 MIX) 100 UNIT/ML ~~LOC~~ SUSP
44.0000 [IU] | Freq: Every day | SUBCUTANEOUS | Status: DC
  Administered 2021-08-30 – 2021-09-01 (×4): 44 [IU] via SUBCUTANEOUS
  Filled 2021-08-30: qty 10

## 2021-08-30 MED ORDER — LIDOCAINE 5 % EX PTCH: 1.0000 | MEDICATED_PATCH | Freq: Two times a day (BID) | CUTANEOUS | Status: DC

## 2021-08-30 NOTE — [place] FL2 (Signed)
[place] MEDICAID FL2 LEVEL OF CARE SCREENING TOOL     IDENTIFICATION  Patient Name: Derrick Atkins Birthdate: 09-Sep-1970 Sex: male Admission Date (Current Location): 08/30/2021  [facility] and [place] Number:  Chiropodist and Address:  [facility], [address] [place], [place] [address]      Provider Number: [id number]  Attending Physician Name and Address:  Georga Hacking, MD  Relative Name and Phone Number:  JEREMIAH, CURCI [facility])   [phone], [name], Medical POA, [phone]    Current Level of Care: Hospital Recommended Level of Care: Skilled Nursing Facility Prior Approval Number:    Date Approved/Denied:   PASRR Number: Pending  Discharge Plan: SNF    Current Diagnoses: Patient Active Problem List   Diagnosis Date Noted   Small bowel obstruction (HCC) 05/13/2020   Chronic low back pain without sciatica 12/09/2017   History of scoliosis 12/09/2017   Schizophrenia, paranoid, chronic (HCC)    Long QT interval 04/16/2014   Hypertriglyceridemia 04/02/2014   Long-term insulin use (HCC) 04/02/2014   Iron deficiency anemia, unspecified 12/31/2013   Hypothyroidism 12/25/2013   DIABETES MELLITUS 05/03/2007   SCHIZOPHRENIA 05/03/2007   HYPERTENSION 05/03/2007   PNEUMONITIS DUE TO FOOD/VOMIT INHALATION 05/03/2007   PECTUS EXCAVATUM 05/03/2007   Type 2 diabetes mellitus without complications (HCC) 05/03/2007    Orientation RESPIRATION BLADDER Height & Weight     Self, Time, Situation, Place  Normal Continent Weight: 253 lb 4.9 oz (114.9 kg) Height:  6\' 4"  (193 cm)  BEHAVIORAL SYMPTOMS/MOOD NEUROLOGICAL BOWEL NUTRITION STATUS      Continent Diet (Normal)  AMBULATORY STATUS COMMUNICATION OF NEEDS Skin   Extensive Assist Verbally Normal                       Personal Care Assistance Level of Assistance  Bathing, Feeding, Dressing Bathing Assistance: Limited assistance Feeding assistance:  Independent Dressing Assistance: Limited assistance     Functional Limitations Info  Sight, Hearing, Speech Sight Info: Adequate Hearing Info: Adequate Speech Info: Adequate    SPECIAL CARE FACTORS FREQUENCY                       Contractures Contractures Info: Not present    Additional Factors Info  Code Status, Allergies, Insulin Sliding Scale Code Status Info: Full Allergies Info: Phenytoin Sodium Extended, Prednisone, Latex   Insulin Sliding Scale Info: Patient recevies 34 units at breakfast and 44 units at dinner       Current Medications (08/30/2021):  This is the current hospital active medication list Current Facility-Administered Medications  Medication Dose Route Frequency Provider Last Rate Last Admin   [START ON 08/31/2021] ARIPiprazole (ABILIFY) tablet 20 mg  20 mg Oral Daily 09/02/2021, MD       aspirin EC tablet 81 mg  81 mg Oral Daily Georga Hacking, MD       buPROPion (WELLBUTRIN XL) 24 hr tablet 300 mg  300 mg Oral Daily Georga Hacking, MD       cyclobenzaprine (FLEXERIL) tablet 5 mg  5 mg Oral QHS Georga Hacking, MD       divalproex (DEPAKOTE) DR tablet 1,000 mg  1,000 mg Oral BID Georga Hacking, MD       gemfibrozil (LOPID) tablet 600 mg  600 mg Oral BID Georga Hacking, MD       ibuprofen (ADVIL) tablet 600 mg  600 mg Oral Q8H PRN Georga Hacking,  Teddy Spike, MD   600 mg at 08/30/21 1400   [START ON 08/31/2021] insulin aspart protamine - aspart (NOVOLOG 70/30 MIX) FlexPen 34 Units  34 Units Subcutaneous Q breakfast Georga Hacking, MD       insulin aspart protamine- aspart (NOVOLOG MIX 70/30) injection 44 Units  44 Units Subcutaneous Q supper Georga Hacking, MD       Melene Muller ON 08/31/2021] levothyroxine (SYNTHROID) tablet 88 mcg  88 mcg Oral Daily Georga Hacking, MD       lidocaine (LIDODERM) 5 % 1 patch  1 patch Transdermal Q24H Georga Hacking, MD       [START ON 08/31/2021] lisinopril (ZESTRIL) tablet 10 mg  10 mg Oral  Daily Georga Hacking, MD       metFORMIN (GLUCOPHAGE) tablet 1,000 mg  1,000 mg Oral BID WC Georga Hacking, MD       naproxen (NAPROSYN) tablet 500 mg  500 mg Oral BID WC Georga Hacking, MD       [START ON 08/31/2021] pioglitazone (ACTOS) tablet 45 mg  45 mg Oral Daily Georga Hacking, MD       simvastatin (ZOCOR) tablet 20 mg  20 mg Oral QHS Georga Hacking, MD       Current Outpatient Medications  Medication Sig Dispense Refill   ARIPiprazole (ABILIFY) 20 MG tablet Take 1 tablet by mouth daily.      buPROPion (WELLBUTRIN XL) 300 MG 24 hr tablet Take 300 mg by mouth daily.     cyclobenzaprine (FLEXERIL) 5 MG tablet Take 1 tablet (5 mg total) by mouth at bedtime. 30 tablet 0   divalproex (DEPAKOTE) 500 MG DR tablet Take 1,000 mg by mouth 2 (two) times daily.      gemfibrozil (LOPID) 600 MG tablet Take 600 mg by mouth 2 (two) times daily.     levothyroxine (SYNTHROID, LEVOTHROID) 88 MCG tablet Take 1 tablet by mouth daily.     lidocaine (LIDODERM) 5 % Place 1 patch onto the skin every 12 (twelve) hours. Remove & Discard patch within 12 hours or as directed by MD 15 patch 0   lisinopril (PRINIVIL,ZESTRIL) 10 MG tablet Take 1 tablet by mouth daily.     metFORMIN (GLUCOPHAGE) 1000 MG tablet Take 1 tablet by mouth 2 (two) times daily with a meal.      naproxen (NAPROSYN) 500 MG tablet Take 1 tablet (500 mg total) by mouth 2 (two) times daily with a meal. 20 tablet 0   NOVOLOG MIX 70/30 FLEXPEN (70-30) 100 UNIT/ML FlexPen Inject 34-44 Units into the skin 2 (two) times daily. 34 units-am 44units-pm     pioglitazone (ACTOS) 45 MG tablet Take 1 tablet by mouth daily.      simvastatin (ZOCOR) 20 MG tablet Take 20 mg by mouth at bedtime.     aspirin 81 MG EC tablet Take 1 tablet by mouth daily.     polyethylene glycol (MIRALAX / GLYCOLAX) 17 g packet Take 17 g by mouth daily. (Patient not taking: Reported on 08/30/2021) 14 each 0     Discharge Medications: Please see discharge  summary for a list of discharge medications.  Relevant Imaging Results:  Relevant Lab Results:   Additional Information SS #: [id number], [place]

## 2021-08-30 NOTE — ED Notes (Signed)
See triage note  presents with family   states he is not able to bear wt   was seen for back pain and ankle yesterday   was found on the floor this am  states they would like to see SW and consider placement

## 2021-08-30 NOTE — Discharge Instructions (Addendum)
You may alternate Tylenol 1000 mg every 6 hours as needed for pain, fever and Ibuprofen 800 mg every 6-8 hours as needed for pain, fever.  Please take Ibuprofen with food.  Do not take more than 4000 mg of Tylenol (acetaminophen) in a 24 hour period.   I recommend wearing compression hose that can help with the swelling in both of your legs which I think will help with pain and mobility.

## 2021-08-30 NOTE — ED Triage Notes (Signed)
Pt denies new injury at this time, states increasing difficulty with walking due to pain to R ankle. Palpable pedal pulses on assessment.

## 2021-08-30 NOTE — ED Triage Notes (Signed)
Pt to ED via [facility] with c/o Ankle swelling and back pain. He was seen here yesterday with for the ankle pain. Brother states that pt is unable to bear weight and wants to talk to a Child psychotherapist.

## 2021-08-30 NOTE — Evaluation (Signed)
Physical Therapy Evaluation Patient Details Name: Derrick Atkins MRN: [id number] DOB: 1970-10-25 Today's Date: 08/30/2021  History of Present Illness  Derrick Atkins is a 51 y.o. male   with past medical history of cerebral palsy, hypertension, diabetes, hypothyroidism, depression, scoliosis, and schizoaffective disorder presented to the ED twice on 08/30/21 with ankle pain and inability to ambulate. No fracture seen on x-ray. He has symmetric LE edema.   Clinical Impression  Patient reclining on ED stretcher and brother Broadus John and godmother Alana at bedside. Patient mildly drowsy but awakens and appears oriented. Patient and family participated in history taking. Patient lives alone in a single story apartment with level entry and a tub/shower combination. Prior to hospitalization he states he was I with bathing but had difficulty due to not being able to get his legs over the side of the tub easily, being too large for the tub, and the tub being too small for a shower chair. His family helped him with dressing, resulting in him wearing the same clothes for 2-3 days at a time. Someone takes him shopping and he eats of lot of microwaved meals. His brother cleans his apartment. At baseline, he ambulated with no AD and denies any falls in the last 6 months. He has a rollator walker but has been unable to use it effectively, partially due to being weaker on the R UE 2/2 CP. Upon PT evaluation, patient was unwilling to weight bear through R ankle/foot stating the discharge instructions they received earlier today said not to put weight on it and it hurt too much to weight bear on. He completed supine to sit mod I with use of handrails and increased time. He completed sit <> stand at edge of ED stretcher to RW with min A and was unable to ambulate with RW. He lost his balance with attempts to scoot his left foot forward requiring min A from PT to prevent fall. Patient was not strong enough to use L LE only to  perform sit to stand or support bodyweight through BUE or hop with L LE when attempting to ambulate. Patient appears to have experienced a significant decline in functional mobility and is not safe to return home at this time. He would benefit from short term rehab to improve function to safely return home. Patient would benefit from skilled physical therapy to address impairments and functional limitations (see PT Problem List below) to work towards stated goals and return to PLOF or maximal functional independence.       Recommendations for follow up therapy are one component of a multi-disciplinary discharge planning process, led by the attending physician.  Recommendations may be updated based on patient status, additional functional criteria and insurance authorization.  Follow Up Recommendations Skilled nursing-short term rehab (<3 hours/day)    Assistance Recommended at Discharge Intermittent Supervision/Assistance  Patient can return home with the following  Two people to help with walking and/or transfers;Two people to help with bathing/dressing/bathroom;Assist for transportation;Assistance with cooking/housework    Equipment Recommendations Rolling walker (2 wheels) (tub bench)  Recommendations for Other Services       Functional Status Assessment Patient has had a recent decline in their functional status and demonstrates the ability to make significant improvements in function in a reasonable and predictable amount of time.     Precautions / Restrictions Precautions Precautions: Fall Restrictions Weight Bearing Restrictions: No      Mobility  Bed Mobility Overal bed mobility: Modified Independent  General bed mobility comments: increased time and use of left bed rail to come to right side of bed.    Transfers Overall transfer level: Needs assistance   Transfers: Sit to/from Stand Sit to Stand: Min assist           General transfer comment: sit  <> stand at edge of ED stretcher to RW with min A and attempting to keep weight of R foot/ankle. Was unable to fully keep weight of R foot/ankle during sit to stand and complained of increased pain. Needs cuing for hand and body placement.    Ambulation/Gait               General Gait Details: unable to ambulate at this time 2/2 too painful to put weight on right foot/ankle and not strong enough to hop on L LE or support body weight through B UE. Attempts to scoot sideways along edge of bed with RW resulted in multiple losses of balance with need for min A from PT to prevent fall.  Stairs            Wheelchair Mobility    Modified Rankin (Stroke Patients Only)       Balance Overall balance assessment: Needs assistance Sitting-balance support: Feet unsupported Sitting balance-Leahy Scale: Good     Standing balance support: Reliant on assistive device for balance, During functional activity, Bilateral upper extremity supported Standing balance-Leahy Scale: Poor Standing balance comment: patient dependent on RW for balance and loses balance with attempts to scoot sideways or move L LE                             Pertinent Vitals/Pain Pain Assessment Pain Assessment: 0-10 Pain Score:  (11/10) Pain Location: right ankle Pain Intervention(s): Limited activity within patient's tolerance, Monitored during session, Repositioned, Patient requesting pain meds-RN notified    Home Living Family/patient expects to be discharged to:: Private residence Living Arrangements: Alone Available Help at Discharge: Family (godmother Alana and brother Broadus John come to assist him every few days. His case worker takes him grocery shopping) Type of Home: Apartment Home Access: Level entry       Home Layout: [facility]: Grab bars - toilet;Rollator (4 wheels) Additional Comments: Patient/family states apartment is set up for someone smaller and he is just too big to  fit well.    Prior Function Prior Level of Function : Independent/Modified Independent             Mobility Comments: Patient reports he ambulated in the home and community with no AD. He denies history of falls. States he lowered himself to the floor last night and did not fall when he originally hurt R ankle. ADLs Comments: Patient states he bathes himeself "but not well" and has trouble using the tub/shower because a shower chair won't fit and it is too small for him to get in/out and the tub is too hard for him to get his leg over. They would like him to have a tub bench and a hand held shower head.  Family assists him with changing clothing and states he wears the same clothes for 2-3 days. His brother cleans his home.     Hand Dominance   Dominant Hand: Left    Extremity/Trunk Assessment   Upper Extremity Assessment Upper Extremity Assessment: RUE deficits/detail RUE Deficits / Details: Holds R UE with fist closed, elbow and wrist flexed in resting position but is  able to open hand and has near full R elbow extension and UE ROM below shoulder. Grossly 3/5 to 3+/5 strength at R UE.    Lower Extremity Assessment Lower Extremity Assessment: RLE deficits/detail;LLE deficits/detail RLE Deficits / Details: Patient is unwilling to put weight on R ankle/foot and states he understood from his discharge paperwork and the physician who saw him the first time he was at the ED today that he is not to put weight on it. Is able to flex R hip and knee to prevent weight bearing on R foot when in standing position but complains of pain at the right thigh in this position. RLE: Unable to fully assess due to pain LLE Deficits / Details: Generalized weakness grossly 4/5 (unable to come to stand on L LE only from ED stretcher).    Cervical / Trunk Assessment Cervical / Trunk Assessment: Normal  Communication   Communication: No difficulties  Cognition Arousal/Alertness: Awake/alert Behavior  During Therapy: WFL for tasks assessed/performed Overall Cognitive Status: Within Functional Limits for tasks assessed                                 General Comments: patient is sleepy and takes increased time to respond. States he has CP that only affects the right side of his body. Godmother and brother contribute to history but patient answers questions when directly addressed and given time to answer.        General Comments General comments (skin integrity, edema, etc.): edema noted at B LE    Exercises Other Exercises Other Exercises: educated patient and familiy on role of PT in acute care setting, discharge reccomendations, lack of weight bearing orders in chart   Assessment/Plan    PT Assessment Patient needs continued PT services  PT Problem List Decreased strength;Decreased knowledge of use of DME;Decreased activity tolerance;Decreased balance;Pain;Decreased knowledge of precautions;Decreased mobility;Decreased coordination       PT Treatment Interventions DME instruction;Balance training;Gait training;Neuromuscular re-education;Stair training;Functional mobility training;Patient/family education;Therapeutic activities;Therapeutic exercise    PT Goals (Current goals can be found in the Care Plan section)  Acute Rehab PT Goals Patient Stated Goal: to go to short term rehab PT Goal Formulation: With patient/family Time For Goal Achievement: 09/13/21 Potential to Achieve Goals: Good    Frequency Min 2X/week     Co-evaluation               AM-PAC PT "6 Clicks" Mobility  Outcome Measure Help needed turning from your back to your side while in a flat bed without using bedrails?: A Little Help needed moving from lying on your back to sitting on the side of a flat bed without using bedrails?: A Little Help needed moving to and from a bed to a chair (including a wheelchair)?: A Lot Help needed standing up from a chair using your arms (e.g., wheelchair  or bedside chair)?: A Little Help needed to walk in hospital room?: Total Help needed climbing 3-5 steps with a railing? : Total 6 Click Score: 13    End of Session Equipment Utilized During Treatment: Gait belt Activity Tolerance: Patient limited by pain Patient left: in bed;with family/visitor present Nurse Communication: Mobility status;Patient requests pain meds PT Visit Diagnosis: Unsteadiness on feet (R26.81);Other abnormalities of gait and mobility (R26.89);Muscle weakness (generalized) (M62.81);Difficulty in walking, not elsewhere classified (R26.2);Pain Pain - Right/Left: Right Pain - part of body: Ankle and joints of foot    Time: 1250-1315  PT Time Calculation (min) (ACUTE ONLY): 25 min   Charges:   PT Evaluation $PT Eval Low Complexity: 1 Low          Sadiya Durand R. Graylon Good, PT, DPT 08/30/21, 1:53 PM

## 2021-08-30 NOTE — Progress Notes (Cosign Needed)
RE: Derrick Atkins   Date of Birth: 03-09-1971  Date: 08/30/2021  To Whom It May Concern:  Please be advised that the above-named patient will require a short-term nursing home stay - anticipated 30 days or less for rehabilitation and strengthening. The plan is for return home.  MD signature

## 2021-08-30 NOTE — ED Provider Notes (Signed)
[facility] Provider Note    Event Date/Time   First MD Initiated Contact with Patient 08/30/21 [id number]     (approximate)   History   Ankle Pain   HPI  Derrick Atkins is a 51 y.o. male with history of cerebral palsy, hypertension, diabetes, schizoaffective disorder who presents to the emergency department with complaints of right ankle pain that has been ongoing for the past month and a half after he twisted his ankle.  Had x-rays and an outpatient but states he never received the results.  Denies any new injury.  Has chronic bilateral lower extremity swelling.  States he is able to ambulate but it is painful to do so.  No chest pain or shortness of breath.  No calf tenderness or calf swelling.   History provided by patient.    Past Medical History:  Diagnosis Date   Cerebral palsy ([place])    Depression    Diabetes mellitus without complication ([place])    Hypertension    Kidney stones    Schizoaffective disorder ([place])    Scoliosis     Past Surgical History:  Procedure Laterality Date   BOWEL RESECTION     LAPAROTOMY N/A 05/15/2020   Procedure: EXPLORATORY LAPAROTOMY;  Surgeon: Olean Ree, MD;  Location: [facility] ORS;  Service: General;  Laterality: N/A;    MEDICATIONS:  Prior to Admission medications   Medication Sig Start Date End Date Taking? Authorizing Provider  ARIPiprazole (ABILIFY) 20 MG tablet Take 1 tablet by mouth daily.  07/03/15   [provider]  aspirin 81 MG EC tablet Take 1 tablet by mouth daily.    [provider]  buPROPion (WELLBUTRIN XL) 300 MG 24 hr tablet Take 300 mg by mouth daily. 05/06/20   [provider]  cyclobenzaprine (FLEXERIL) 5 MG tablet Take 1 tablet (5 mg total) by mouth at bedtime. 06/04/20   Tylene Fantasia, PA-C  divalproex (DEPAKOTE) 500 MG DR tablet Take 1,000 mg by mouth 2 (two) times daily.  07/03/15   [provider]  gemfibrozil (LOPID) 600 MG tablet Take 600 mg by  mouth 2 (two) times daily. 05/12/20   [provider]  levothyroxine (SYNTHROID, LEVOTHROID) 88 MCG tablet Take 1 tablet by mouth daily. 07/03/15   [provider]  lidocaine (LIDODERM) 5 % Place 1 patch onto the skin every 12 (twelve) hours. Remove & Discard patch within 12 hours or as directed by MD 04/07/16   Carrie Mew, MD  lisinopril (PRINIVIL,ZESTRIL) 10 MG tablet Take 1 tablet by mouth daily. 07/03/15   [provider]  metFORMIN (GLUCOPHAGE) 1000 MG tablet Take 1 tablet by mouth 2 (two) times daily with a meal.  07/03/15   [provider]  naproxen (NAPROSYN) 500 MG tablet Take 1 tablet (500 mg total) by mouth 2 (two) times daily with a meal. 04/07/16   Carrie Mew, MD  NOVOLOG MIX 70/30 FLEXPEN (70-30) 100 UNIT/ML FlexPen Inject 34-44 Units into the skin 2 (two) times daily. 34 units-am 44units-pm 07/01/15   [provider]  pioglitazone (ACTOS) 45 MG tablet Take 1 tablet by mouth daily.  07/03/15   [provider]  polyethylene glycol (MIRALAX / GLYCOLAX) 17 g packet Take 17 g by mouth daily. 05/23/20   Tylene Fantasia, PA-C    Physical Exam   Triage Vital Signs: ED Triage Vitals  Enc Vitals Group     BP 08/30/21 0021 (!) 140/59     Pulse Rate 08/30/21  0021 86     Resp 08/30/21 0021 18     Temp 08/30/21 0021 97.6 F (36.4 C)     Temp Source 08/30/21 0021 Oral     SpO2 08/30/21 0021 99 %     Weight --      Height --      Head Circumference --      Peak Flow --      Pain Score 08/30/21 0023 10     Pain Loc --      Pain Edu? --      Excl. in GC? --     Most recent vital signs: Vitals:   08/30/21 0021 08/30/21 0542  BP: (!) 140/59 (!) 144/56  Pulse: 86 75  Resp: 18 20  Temp: 97.6 F (36.4 C) (!) 97.5 F (36.4 C)  SpO2: 99% 98%     CONSTITUTIONAL: Alert and responds appropriately to questions. Well-appearing; well-nourished, obese HEAD: Normocephalic, atraumatic EYES: Conjunctivae clear, pupils  appear equal ENT: normal nose; moist mucous membranes NECK: Normal range of motion CARD: Regular rate and rhythm RESP: Normal chest excursion without splinting or tachypnea; no hypoxia or respiratory distress, speaking full sentences ABD/GI: non-distended EXT: Normal ROM in all joints, no major deformities noted, symmetric edema in bilateral distal lower extremities, 2+ DP pulses bilaterally, tender to palpation over the lateral and medial malleolus on the right ankle without deformity, ecchymosis, redness or warmth.  Full range of motion.  Normal capillary refill.  Compartments soft.  No calf tenderness or calf swelling. SKIN: Normal color for age and race, no rashes on exposed skin NEURO: Moves all extremities equally, normal speech, no facial asymmetry noted PSYCH: The patient's mood and manner are appropriate. Grooming and personal hygiene are appropriate.  ED Results / Procedures / Treatments   LABS: (all labs ordered are listed, but only abnormal results are displayed) Labs Reviewed - No data to display   EKG:    RADIOLOGY: My personal review and interpretation of imaging: X-ray of the right ankle shows no fracture or dislocation.  I have personally reviewed all radiology reports. DG Ankle Complete Right  Result Date: 08/30/2021 CLINICAL DATA:  Right ankle pain, injury 1 month ago, initial encounter EXAM: RIGHT ANKLE - COMPLETE 3+ VIEW COMPARISON:  By report from 07/31/2021 FINDINGS: Mild soft tissue swelling is noted about the ankle joint. No acute fracture or dislocation is noted. Mild calcaneal spurring is noted. IMPRESSION: Mild soft tissue swelling without acute bony abnormality. Electronically Signed   By: Alcide Clever M.D.   On: 08/30/2021 00:55     PROCEDURES:  Critical Care performed: No   CRITICAL CARE Performed by: Baxter Hire Marilyn Nihiser   Total critical care time: 0 minutes  Critical care time was exclusive of separately billable procedures and treating other  patients.  Critical care was necessary to treat or prevent imminent or life-threatening deterioration.  Critical care was time spent personally by me on the following activities: development of treatment plan with patient and/or surrogate as well as nursing, discussions with consultants, evaluation of patient's response to treatment, examination of patient, obtaining history from patient or surrogate, ordering and performing treatments and interventions, ordering and review of laboratory studies, ordering and review of radiographic studies, pulse oximetry and re-evaluation of patient's condition.   Procedures    IMPRESSION / MDM / ASSESSMENT AND PLAN / ED COURSE  I reviewed the triage vital signs and the nursing notes.   Patient here with right ankle injury that occurred over a  month and a half ago.     DIFFERENTIAL DIAGNOSIS (includes but not limited to):   Ankle sprain, less likely ankle fracture.  No sign of gout, septic arthritis, DVT, arterial obstruction, claudication, cellulitis on exam.  He has mild peripheral edema that is symmetric bilaterally that he states is chronic.   PLAN: We will obtain x-ray of the left ankle.  Will give ibuprofen.  He states he is having a hard time walking due to the pain but has been able to ambulate.  Have offered crutches.  We will wrap him in an Ace wrap.   MEDICATIONS GIVEN IN ED: Medications  ibuprofen (ADVIL) tablet 800 mg (800 mg Oral Given 08/30/21 0422)     ED COURSE: X-ray reviewed by myself and radiologist and shows no fracture, dislocation.  Will discharge home.  Recommend alternating Tylenol, Motrin as needed for pain.  Recommended rest, elevation and ice.  Will give outpatient orthopedic follow-up if symptoms not improving with medical management.   At this time, I do not feel there is any life-threatening condition present. I reviewed all nursing notes, vitals, pertinent previous records.  All lab and urine results, EKGs, imaging  ordered have been independently reviewed and interpreted by myself.  I reviewed all available radiology reports from any imaging ordered this visit.  Based on my assessment, I feel the patient is safe to be discharged home without further emergent workup and can continue workup as an outpatient as needed. Discussed all findings, treatment plan as well as usual and customary return precautions with patient.  They verbalize understanding and are comfortable with this plan.  Outpatient follow-up has been provided as needed.  All questions have been answered.   CONSULTS: No emergent orthopedic consultation needed given normal x-rays.  Patient was discharged and ended up leaving his crutches in the room.   OUTSIDE RECORDS REVIEWED: Reviewed patient's previous office note on 07/31/2021 where he was seen for this right ankle injury.         FINAL CLINICAL IMPRESSION(S) / ED DIAGNOSES   Final diagnoses:  Acute right ankle pain     Rx / DC Orders   ED Discharge Orders     None        Note:  This document was prepared using Dragon voice recognition software and may include unintentional dictation errors.   Delynn Olvera, Delice Bison, DO 08/30/21 [phone]

## 2021-08-30 NOTE — ED Provider Notes (Addendum)
[facility] Provider Note    Event Date/Time   First MD Initiated Contact with Patient 08/30/21 1211     (approximate)   History   Ankle Pain and Back Pain   HPI  Derrick Atkins is a 51 y.o. male   with past medical history of cerebral palsy, hypertension diabetes and schizoaffective disorder presents with ankle pain and inability to ambulate.  Patient lives independently.  Has been having pain of the right ankle for about 1 month.  Got to the point that last night he was unable to get up on his own when he had to use the bathroom.  He subsequently fell to the ground and defecated on himself and his brother had to clean him up.  He was seen in the emergency department last night had x-ray that was unremarkable and was ultimately discharged.  He returns today with his brother and mom who notes that he is unable to get around at home and is not appropriate for discharge.  They would like to have him placed.  Patient tells me he has had bilateral lower extremity swelling for some time and family says it has not been addressed.  He has been advised to use compression stockings and elevate the legs.  Patient spends a lot of time sitting in a chair without elevating the legs.  He denies shortness of breath chest pain fevers or chills.     Past Medical History:  Diagnosis Date   Cerebral palsy (HCC)    Depression    Diabetes mellitus without complication (HCC)    Hypertension    Kidney stones    Schizoaffective disorder (HCC)    Scoliosis     Patient Active Problem List   Diagnosis Date Noted   Small bowel obstruction (HCC) 05/13/2020   Chronic low back pain without sciatica 12/09/2017   History of scoliosis 12/09/2017   Schizophrenia, paranoid, chronic (HCC)    Long QT interval 04/16/2014   Hypertriglyceridemia 04/02/2014   Long-term insulin use (HCC) 04/02/2014   Iron deficiency anemia, unspecified 12/31/2013   Hypothyroidism 12/25/2013   DIABETES  MELLITUS 05/03/2007   SCHIZOPHRENIA 05/03/2007   HYPERTENSION 05/03/2007   PNEUMONITIS DUE TO FOOD/VOMIT INHALATION 05/03/2007   PECTUS EXCAVATUM 05/03/2007   Type 2 diabetes mellitus without complications (HCC) 05/03/2007     Physical Exam  Triage Vital Signs: ED Triage Vitals  Enc Vitals Group     BP 08/30/21 1157 117/66     Pulse Rate 08/30/21 1157 73     Resp 08/30/21 1157 17     Temp 08/30/21 1157 97.6 F (36.4 C)     Temp Source 08/30/21 1157 Oral     SpO2 08/30/21 1157 98 %     Weight 08/30/21 1145 253 lb 4.9 oz (114.9 kg)     Height 08/30/21 1145 6\' 4"  (1.93 m)     Head Circumference --      Peak Flow --      Pain Score 08/30/21 1145 6     Pain Loc --      Pain Edu? --      Excl. in GC? --     Most recent vital signs: Vitals:   08/30/21 1157  BP: 117/66  Pulse: 73  Resp: 17  Temp: 97.6 F (36.4 C)  SpO2: 98%     General: Awake, no distress.  CV:  Good peripheral perfusion.  Resp:  Normal effort.  Abd:  No distention.  Neuro:  Awake, Alert, Oriented x 3 Other:  Bilateral lower extremity edema, symmetric, venous stasis changes Tenderness to palpation of the right lateral ankle, creased range of motion which patient says is baseline for him due to his CP 2+ DP pulses bilaterally   ED Results / Procedures / Treatments  Labs (all labs ordered are listed, but only abnormal results are displayed) Labs Reviewed  RESP PANEL BY RT-PCR (FLU A&B, COVID) [id number]  COMPREHENSIVE METABOLIC PANEL  CBC WITH DIFFERENTIAL/PLATELET  BRAIN NATRIURETIC PEPTIDE     EKG     RADIOLOGY    PROCEDURES:    MEDICATIONS ORDERED IN ED: Medications - No data to display   IMPRESSION / MDM / ASSESSMENT AND PLAN / ED COURSE  I reviewed the triage vital signs and the nursing notes.                              Differential diagnosis includes, but is not limited to, venous stasis, CHF, cirrhosis, edema  Patient is a 51 year old male rather high  functioning with cerebral palsy and schizophrenia who presents with right ankle pain and inability to ambulate.  Has had ongoing issues with right ankle pain and lower extremity swelling which culminated in last night him being unable to get up to use the bathroom himself and subsequently defecated on himself.  He was seen in the ED last night had a normal x-ray and was discharged and is now returning with his mom and brother who notes that he is not appropriate to be at home they would like to have him placed.  On exam he does have symmetric lower extremity edema which I suspect is related to venous stasis but will obtain labs including BNP and renal function.  He is not short of breath and has normal vital signs.  He does have tenderness over the right lateral ankle, suspect overuse versus sprain.  No fracture seen on x-ray before.  Ultimately I suspect patient may need to board in the ED.  His family is unwilling to take him home.  PT OT and social work consults placed.  Patient's chest x-ray is without cardiomegaly or pulmonary edema.  CBC CMP and BNP all within normal limits.  Patient was unable to weight-bear with PT.  They are recommending rehab.  Patient placed in boarding status.      FINAL CLINICAL IMPRESSION(S) / ED DIAGNOSES   Final diagnoses:  Chronic pain of right ankle  Lower extremity edema     Rx / DC Orders   ED Discharge Orders     None        Note:  This document was prepared using Dragon voice recognition software and may include unintentional dictation errors.   Georga Hacking, MD 08/30/21 1235    Georga Hacking, MD 08/30/21 1407

## 2021-08-30 NOTE — TOC Initial Note (Signed)
Transition of Care [facility]) - Initial/Assessment Note    Patient Details  Name: Derrick Atkins MRN: [id number] Date of Birth: April 11, 1971  Transition of Care [facility]) CM/SW Contact:    Raina Mina, [place] Phone Number: 08/30/2021, 3:11 PM  Clinical Narrative: CSW spoke with patients brother, Avetis Caster, Medical POA Denman George, and ACT team member with [facility] [phone]). Family stated they would like patient to go to SNF to work on his mobility with his ankle. CSW explained with patients mental health diagnosis of Schizophrenia that patient might not get any bed offers. CSW explained that if there are no bed offers then patient would need to discharge with home health services. Patient has had [place] with [facility] in the past. Family states patient is schizoaffective. Patients prior history in his charts stated schizophrenia. Ms. Bobby Rumpf stated at the end of the day he will not be going home and the hospital will place him. CSW stated if patient does not get any bed offers he would not be able to stay in the ED. CSW explained if they refuse to pick patient up then an APS report will be filed. Patients brother stated no he will take him home and file an appeal with skilled nursing facilities. CSW explained you cannot file an appeal with the SNF's and explained the SNF's do not have to take you. Ms. Bobby Rumpf asked even if its recommended by the doctor and CSW stated yes. CSW explained the SNF's are separate from the hospital. Patient will need a level ll pasarr number. CSW will start bed search.                      Expected Discharge Plan: Skilled Nursing Facility Barriers to Discharge: Continued Medical Work up   Patient Goals and CMS Choice Patient states their goals for this hospitalization and ongoing recovery are:: Return home      Expected Discharge Plan and Services Expected Discharge Plan: [place]       Living arrangements for the past 2  months: Single Family Home                                      Prior Living Arrangements/Services Living arrangements for the past 2 months: Single Family Home Lives with:: Self Patient language and need for interpreter reviewed:: Yes Do you feel safe going back to the place where you live?: Yes      Need for Family Participation in Patient Care: Yes (Comment) Care giver support system in place?: Yes (comment)   Criminal Activity/Legal Involvement Pertinent to Current Situation/Hospitalization: No - Comment as needed  Activities of Daily Living      Permission Sought/Granted Permission sought to share information with : Family Supports Permission granted to share information with : Yes, Verbal Permission Granted  Share Information with NAME: Greggory Brandy, brother, Denman George, Medical POA     Permission granted to share info w Relationship: Brother/Godmother  Permission granted to share info w Contact Information: [phone]  Emotional Assessment Appearance:: Appears stated age Attitude/Demeanor/Rapport: Engaged Affect (typically observed): Accepting Orientation: : Oriented to Self, Oriented to Place, Oriented to  Time, Oriented to Situation Alcohol / Substance Use: Not Applicable Psych Involvement: No (comment)  Admission diagnosis:  ankle swollen --ems Patient Active Problem List   Diagnosis Date Noted   Small bowel obstruction ([place]) 05/13/2020   Chronic low back pain  without sciatica 12/09/2017   History of scoliosis 12/09/2017   Schizophrenia, paranoid, chronic ([place])    Long QT interval 04/16/2014   Hypertriglyceridemia 04/02/2014   Long-term insulin use ([place]) 04/02/2014   Iron deficiency anemia, unspecified 12/31/2013   Hypothyroidism 12/25/2013   DIABETES MELLITUS 05/03/2007   SCHIZOPHRENIA 05/03/2007   HYPERTENSION 05/03/2007   PNEUMONITIS DUE TO FOOD/VOMIT INHALATION 05/03/2007   PECTUS EXCAVATUM 05/03/2007   Type 2 diabetes  mellitus without complications ([place]) [id number]   PCP:  Meade Maw, MD (Inactive) Pharmacy:   [facility] [place] [address] [place] [address] Phone: [phone] Fax: [phone]     Social Determinants of Health (SDOH) Interventions    Readmission Risk Interventions No flowsheet data found.

## 2021-08-30 NOTE — ED Notes (Signed)
Pt given ice water. Call bell on LEFT side, pt only able to use LEFT arm. No other needs voiced at this time.

## 2021-08-30 NOTE — ED Triage Notes (Signed)
Pt to ED via [facility] with c/o R ankle pain. Per EMS pt from home. Per EMS pt c/o pain x 1 month, was seen at [facility] 1 month ago when he sprained his ankle, had X-ray's done and was told they would call him with results, however patient has not received phone call in 1 month with results of ankle X-ray. Per EMS pt c/o increasing pain to R ankle, no new injury, hx of CP and scoliosis. Per EMS pt took 500mg  Tylenol PTA, unknown what time.   87HR 99% RA 113/56 CBG 71

## 2021-08-31 DIAGNOSIS — M25571 Pain in right ankle and joints of right foot: Secondary | ICD-10-CM | POA: Diagnosis not present

## 2021-08-31 LAB — CBG MONITORING, ED
Glucose-Capillary: 126 mg/dL — ABNORMAL HIGH (ref 70–99)
Glucose-Capillary: 131 mg/dL — ABNORMAL HIGH (ref 70–99)
Glucose-Capillary: 53 mg/dL — ABNORMAL LOW (ref 70–99)
Glucose-Capillary: 74 mg/dL (ref 70–99)
Glucose-Capillary: 84 mg/dL (ref 70–99)
Glucose-Capillary: 98 mg/dL (ref 70–99)

## 2021-08-31 NOTE — TOC Progression Note (Signed)
Transition of Care [facility]) - Progression Note    Patient Details  Name: Derrick Atkins MRN: [id number] Date of Birth: 02/09/1971  Transition of Care [facility]) CM/SW Contact  Allayne Butcher, RN Phone Number: 08/31/2021, 2:18 PM  Clinical Narrative:    Bed offers reviewed with patient's HCPOA, [name], and brother, [name].  Family chooses [facility], now [facility].  RNCM called and left a message for [facility]missions for return call- need to see if they can accept tomorrow.     Expected Discharge Plan: Skilled Nursing Facility Barriers to Discharge: Continued Medical Work up  Expected Discharge Plan and Services Expected Discharge Plan: Skilled Nursing Facility       Living arrangements for the past 2 months: Single Family Home                                       Social Determinants of Health (SDOH) Interventions    Readmission Risk Interventions No flowsheet data found.

## 2021-08-31 NOTE — Evaluation (Signed)
Occupational Therapy Evaluation Patient Details Name: Derrick Atkins MRN: [id number] DOB: 10-Jul-1971 Today's Date: 08/31/2021   History of Present Illness Derrick Atkins is a 51 y.o. male   with past medical history of cerebral palsy, hypertension, diabetes, hypothyroidism, depression, scoliosis, and schizoaffective disorder presented to the ED twice on 08/30/21 with ankle pain and inability to ambulate. No fracture seen on x-ray. He has symmetric LE edema.   Clinical Impression   Patient presenting with decreased Ind in self care,balance, functional mobility/transfers, endurance, and safety awareness. Patient reports being mod I at baseline with self care tasks and mobility. He lives in an apartment but has some difficulties with getting in/out of tub and reports he is too large for some of the spaces in his small apartment. Pt is pleasant and agreeable throughout session. Min cuing for bed mobility but no physical assistance to EOB. PT attempting to stand and is unable to tolerate weight bearing through R ankle. Pt unable to take steps or transfer and reports 9/10 pain in back and R ankle. Patient will benefit from acute OT to increase overall independence in the areas of ADLs, functional mobility, and safety awareness in order to safely discharge to next venue of care.      Recommendations for follow up therapy are one component of a multi-disciplinary discharge planning process, led by the attending physician.  Recommendations may be updated based on patient status, additional functional criteria and insurance authorization.   Follow Up Recommendations  Skilled nursing-short term rehab (<3 hours/day)    Assistance Recommended at Discharge Intermittent Supervision/Assistance  Patient can return home with the following A lot of help with walking and/or transfers;A lot of help with bathing/dressing/bathroom;Help with stairs or ramp for entrance    Functional Status Assessment  Patient has  had a recent decline in their functional status and demonstrates the ability to make significant improvements in function in a reasonable and predictable amount of time.  Equipment Recommendations  Other (comment);Tub/shower bench (RW)       Precautions / Restrictions Precautions Precautions: Fall Restrictions Weight Bearing Restrictions: No      Mobility Bed Mobility Overal bed mobility: Needs Assistance Bed Mobility: Supine to Sit, Sit to Supine     Supine to sit: Supervision Sit to supine: Supervision   General bed mobility comments: min cuing for technique and hand placement    Transfers Overall transfer level: Needs assistance   Transfers: Sit to/from Stand Sit to Stand: Mod assist           General transfer comment: pt unable to bear weight through R LE while standing and reports 10/10 pain in back and R ankle      Balance Overall balance assessment: Needs assistance Sitting-balance support: Feet unsupported Sitting balance-Leahy Scale: Good     Standing balance support: Reliant on assistive device for balance, During functional activity, Bilateral upper extremity supported Standing balance-Leahy Scale: Poor                             ADL either performed or assessed with clinical judgement   ADL Overall ADL's : Needs assistance/impaired     Grooming: Wash/dry hands;Wash/dry face;Set up;Sitting               Lower Body Dressing: Maximal assistance;Moderate assistance                       Vision Patient Visual Report: No  change from baseline              Pertinent Vitals/Pain Pain Assessment Pain Assessment: 0-10 Pain Score: 10-Worst pain ever Pain Location: right ankle and back Pain Descriptors / Indicators: Aching, Discomfort Pain Intervention(s): Limited activity within patient's tolerance, Monitored during session, Repositioned, Premedicated before session     Hand Dominance Left   Extremity/Trunk  Assessment Upper Extremity Assessment Upper Extremity Assessment: Generalized weakness;RUE deficits/detail RUE Deficits / Details: 3+/5 but weaker at baseline secondary to CP   Lower Extremity Assessment Lower Extremity Assessment: Defer to PT evaluation       Communication Communication Communication: No difficulties   Cognition Arousal/Alertness: Awake/alert Behavior During Therapy: WFL for tasks assessed/performed Overall Cognitive Status: Within Functional Limits for tasks assessed                                                  Home Living Family/patient expects to be discharged to:: Private residence Living Arrangements: Alone Available Help at Discharge: Family Type of Home: Apartment Home Access: Level entry     Home Layout: One level     Bathroom Shower/Tub: Teacher, early years/pre: Standard     Home Equipment: Grab bars - toilet;Rollator (4 wheels)   Additional Comments: Patient/family states apartment is set up for someone smaller and he is just too big to fit well.      Prior Functioning/Environment Prior Level of Function : Independent/Modified Independent             Mobility Comments: Patient reports he ambulated in the home and community with no AD. He denies history of falls. He walks to grocery store if unable to get a ride ADLs Comments: Patient states he bathes himeself "but not well" and has trouble using the tub/shower because a shower chair won't fit and it is too small for him to get in/out and the tub is too hard for him to get his leg over..  Family assists him with changing clothing and states he wears the same clothes for 2-3 days. His brother cleans his home.        OT Problem List: Decreased strength;Decreased activity tolerance;Impaired balance (sitting and/or standing);Decreased safety awareness;Pain;Decreased knowledge of use of DME or AE      OT Treatment/Interventions: Self-care/ADL  training;Manual therapy;Therapeutic exercise;Patient/family education;Energy conservation;Therapeutic activities;DME and/or AE instruction;Balance training    OT Goals(Current goals can be found in the care plan section) Acute Rehab OT Goals Patient Stated Goal: to get better and decrease pain OT Goal Formulation: With patient Time For Goal Achievement: 09/14/21 Potential to Achieve Goals: Fair  OT Frequency: Min 2X/week       AM-PAC OT "6 Clicks" Daily Activity     Outcome Measure Help from another person eating meals?: None Help from another person taking care of personal grooming?: A Little Help from another person toileting, which includes using toliet, bedpan, or urinal?: A Lot Help from another person bathing (including washing, rinsing, drying)?: A Lot Help from another person to put on and taking off regular upper body clothing?: None Help from another person to put on and taking off regular lower body clothing?: A Lot 6 Click Score: 17   End of Session Nurse Communication: Mobility status  Activity Tolerance: Patient limited by pain Patient left: in bed;with call bell/phone within reach  OT Visit  Diagnosis: Unsteadiness on feet (R26.81);Muscle weakness (generalized) (M62.81);Pain Pain - Right/Left: Right Pain - part of body: Ankle and joints of foot                Time: [id number] OT Time Calculation (min): 18 min Charges:  OT General Charges $OT Visit: 1 Visit OT Evaluation $OT Eval Moderate Complexity: 1 Mod OT Treatments $Therapeutic Activity: 8-22 mins  Darleen Crocker, MS, OTR/L , CBIS ascom [phone]  08/31/21, 1:10 PM

## 2021-08-31 NOTE — Progress Notes (Signed)
Inpatient Diabetes Program Recommendations  AACE/ADA: New Consensus Statement on Inpatient Glycemic Control (2015)  Target Ranges:  Prepandial:   less than 140 mg/dL      Peak postprandial:   less than 180 mg/dL (1-2 hours)      Critically ill patients:  140 - 180 mg/dL   Lab Results  Component Value Date   GLUCAP 84 08/31/2021   HGBA1C 5.7 (H) 05/13/2020    Review of Glycemic Control  Latest Reference Range & Units 05/23/20 08:00 05/23/20 12:24 08/30/21 17:07 08/30/21 19:45 08/31/21 00:09 08/31/21 02:02 08/31/21 08:39  Glucose-Capillary 70 - 99 mg/dL 480 (H) 165 (H) 88 537 (H) 53 (L) 98 84  (H): Data is abnormally high (L): Data is abnormally low  Diabetes history: DM2 Outpatient Diabetes medications: 70/30 34 units am, 44 units pm, Actos 45 mg qd, Metformin 1 gm bid Current orders for Inpatient glycemic control: 70/30 34 units am, 44 units pm, Actos 45 mg qd, Metformin 1 gm bid  Inpatient Diabetes Program Recommendations:   Patient currently in ED. Please consider: -Decrease in 70/30 insulin by 50% while in ED  Thank you, Derrick Atkins. Marina Desire, RN, MSN, CDE  Diabetes Coordinator Inpatient Glycemic Control Team Team Pager [phone] (8am-5pm) 08/31/2021 12:19 PM

## 2021-08-31 NOTE — ED Provider Notes (Signed)
----------------------------------------- °  5:23 AM on 08/31/2021 -----------------------------------------   Blood pressure 123/66, pulse 84, temperature 97.6 F (36.4 C), temperature source Oral, resp. rate 16, height 6\' 4"  (1.93 m), weight 114.9 kg, SpO2 100 %.  The patient is calm and cooperative at this time.  There have been no acute events since the last update.  Awaiting disposition plan from Social Work team.   Paulette Blanch, MD 08/31/21 [phone]

## 2021-08-31 NOTE — ED Notes (Signed)
Alert, NAD, calm, snack eaten, scrolling/ listening to cell phone. Pending breakfast.

## 2021-08-31 NOTE — ED Notes (Signed)
Pt's CBG checked by this RN, noted to be 53, pt provided sandwich tray per pt's request. Will recheck CBG after patient finished eating.

## 2021-08-31 NOTE — ED Notes (Signed)
Awake, lying flat, knee flexed and legs crossed, scrolling on phone. Alert, NAD, calm, interactive. Synthroid given. C/o back pain.

## 2021-09-01 DIAGNOSIS — M25571 Pain in right ankle and joints of right foot: Secondary | ICD-10-CM | POA: Diagnosis not present

## 2021-09-01 LAB — CBG MONITORING, ED
Glucose-Capillary: 115 mg/dL — ABNORMAL HIGH (ref 70–99)
Glucose-Capillary: 171 mg/dL — ABNORMAL HIGH (ref 70–99)

## 2021-09-01 MED ORDER — INSULIN ASPART PROT & ASPART (70-30 MIX) 100 UNIT/ML ~~LOC~~ SUSP
34.0000 [IU] | Freq: Every day | SUBCUTANEOUS | Status: DC
  Filled 2021-09-01: qty 10

## 2021-09-01 NOTE — TOC Progression Note (Signed)
Transition of Care [facility]) - Progression Note    Patient Details  Name: Derrick Atkins MRN: [id number] Date of Birth: 1971-07-05  [facility]) CM/SW Contact  Allayne Butcher, RN Phone Number: 09/01/2021, 4:00 PM  Clinical Narrative:     Brother does not feel comfortable transporting patient and requests that EMS be called.  ED secretary to call and setup EMS.    Expected Discharge Plan: Skilled Nursing Facility Barriers to Discharge: Barriers Resolved  Expected Discharge Plan and Services Expected Discharge Plan: Skilled Nursing Facility       Living arrangements for the past 2 months: Single Family Home                 DME Arranged: N/A DME Agency: NA       HH Arranged: NA HH Agency: NA         Social Determinants of Health (SDOH) Interventions    Readmission Risk Interventions No flowsheet data found.

## 2021-09-01 NOTE — ED Notes (Signed)
Pt assisted to use bathroom. Placed back in bed without difficulty and repositioned.

## 2021-09-01 NOTE — ED Notes (Signed)
PT in room at this time 

## 2021-09-01 NOTE — ED Notes (Signed)
Pt resting in bed. NAD. Respirations equal and non labored. Call light within reach.

## 2021-09-01 NOTE — ED Provider Notes (Signed)
----------------------------------------- °  5:32 AM on 09/01/2021 -----------------------------------------   Blood pressure (!) 136/57, pulse 85, temperature 98.3 F (36.8 C), temperature source Oral, resp. rate 17, height 6\' 4"  (1.93 m), weight 114.9 kg, SpO2 96 %.  The patient is calm and cooperative at this time.  There have been no acute events since the last update.  Awaiting disposition plan from Social Work team.   , MD 09/01/21 [phone]

## 2021-09-01 NOTE — TOC Progression Note (Signed)
Transition of Care [facility]) - Progression Note    Patient Details  Name: Derrick Atkins MRN: [id number] Date of Birth: 1971-05-05  Transition of Care [facility]) CM/SW Contact  Allayne Butcher, RN Phone Number: 09/01/2021, 12:40 PM  Clinical Narrative:    Loletta Parish will transport patient to [facility] in [place],.  He should be at the hospital in the next half hour.    Expected Discharge Plan: Skilled Nursing Facility Barriers to Discharge: Barriers Resolved  Expected Discharge Plan and Services Expected Discharge Plan: Skilled Nursing Facility       Living arrangements for the past 2 months: Single Family Home                 DME Arranged: N/A DME Agency: NA       HH Arranged: NA HH Agency: NA         Social Determinants of Health (SDOH) Interventions    Readmission Risk Interventions No flowsheet data found.

## 2021-09-01 NOTE — ED Notes (Signed)
Pt resting with eyes closed. Awakens easily to verbal stimuli. NAD. He is waiting for his brother to come and take him to [facility]. No concerns or needs voiced.

## 2021-09-01 NOTE — TOC Progression Note (Signed)
Transition of Care [facility]) - Progression Note    Patient Details  Name: Derrick Atkins MRN: [id number] Date of Birth: April 30, 1971  Transition of Care [facility]) CM/SW Contact  Allayne Butcher, RN Phone Number: 09/01/2021, 9:54 AM  Clinical Narrative:    Insurance authorization approved for short term rehab at [facility].  Auth # [id number], reference [id number].  Approved for 2/21-2/23.   Message left with Demetris at [facility] to see if they have a bed available.   Expected Discharge Plan: Skilled Nursing Facility Barriers to Discharge: Continued Medical Work up  Expected Discharge Plan and Services Expected Discharge Plan: Skilled Nursing Facility       Living arrangements for the past 2 months: Single Family Home                                       Social Determinants of Health (SDOH) Interventions    Readmission Risk Interventions No flowsheet data found.

## 2021-09-01 NOTE — ED Notes (Signed)
Pts brother is here and does not feel comfortable in taking pt to [facility]. Notified Education officer, museum.

## 2021-09-01 NOTE — ED Notes (Signed)
Pt repositioned for comfort, provided snack and drink per request. No other needs verbalized at this time

## 2021-09-01 NOTE — ED Notes (Signed)
Pt provided drink per request. Voices no additional needs or c/o at this time

## 2021-09-01 NOTE — ED Notes (Signed)
Report called to [place] at [facility]

## 2021-09-01 NOTE — TOC Transition Note (Signed)
Transition of Care [facility]) - CM/SW Discharge Note   Patient Details  Name: Derrick Atkins MRN: [id number] Date of Birth: [birth year]  Transition of Care [facility]) CM/SW Contact:  Allayne Butcher, RN Phone Number: 09/01/2021, 11:58 AM   Clinical Narrative:    Insurance authorization received and patient can discharge to [facility] today.  Pasrr [id number], exp 09/30/2021.  Patient will go to room 164, ER nurse will call report to [phone].  ED secretary to arrange EMS transport.  RNCM will update patient's family on discharge.     Final next level of care: Skilled Nursing Facility Barriers to Discharge: Barriers Resolved   Patient Goals and CMS Choice Patient states their goals for this hospitalization and ongoing recovery are:: Go to skilled nursing for short term rehab CMS Medicare.gov Compare Post Acute Care list provided to:: Patient Represenative (must comment) Choice offered to / list presented to : [facility] POA / Guardian  Discharge Placement PASRR number recieved: 08/31/21            Patient chooses bed at: Other - please specify in the comment section below: [facility]) Patient to be transferred to facility by: [facility] Name of family member notified: Yandiel Bergum- brother Patient and family notified of of transfer: 09/01/21  Discharge Plan and Services                DME Arranged: N/A DME Agency: NA       HH Arranged: NA HH Agency: NA        Social Determinants of Health (SDOH) Interventions     Readmission Risk Interventions No flowsheet data found.

## 2021-09-01 NOTE — Progress Notes (Signed)
Physical Therapy Treatment Patient Details Name: Derrick Atkins MRN: [id number] DOB: Sep 01, 1970 Today's Date: 09/01/2021   History of Present Illness Derrick Atkins is a 51 y.o. male   with past medical history of cerebral palsy, hypertension, diabetes, hypothyroidism, depression, scoliosis, and schizoaffective disorder presented to the ED twice on 08/30/21 with ankle pain and inability to ambulate. No fracture seen on x-ray. He has symmetric LE edema.    PT Comments    Pt showed good willingness and eagerness to participate with PT but struggled with all attempts at stepping/WBing through R.  He was able to maintain static standing with CGA and walker but even with heavy assist to unweight on numerous occasions he never put any weight through the heel and could not effectively move L because of inability to take weight through R UorLEs.    Recommendations for follow up therapy are one component of a multi-disciplinary discharge planning process, led by the attending physician.  Recommendations may be updated based on patient status, additional functional criteria and insurance authorization.  Follow Up Recommendations  Skilled nursing-short term rehab (<3 hours/day)     Assistance Recommended at Discharge Intermittent Supervision/Assistance  Patient can return home with the following Two people to help with walking and/or transfers;Two people to help with bathing/dressing/bathroom;Assist for transportation;Assistance with Comptroller (2 wheels)    Recommendations for Other Services       Precautions / Restrictions Precautions Precautions: Fall Restrictions Weight Bearing Restrictions: No     Mobility  Bed Mobility Overal bed mobility: Needs Assistance Bed Mobility: Supine to Sit, Sit to Supine     Supine to sit: Min guard     General bed mobility comments: cuing for technique and hand placement, reliant on momentum after  multiple rocking attempts    Transfers Overall transfer level: Needs assistance   Transfers: Sit to/from Stand Sit to Stand: Mod assist           General transfer comment: Multiple sit to stand bouts, each with pt showing good effort but needing a least some assist each time and unable to effectively use R LE to do any real assist pt unable to bear weight through R heel while standing, heavy (unweighting) assist from PT and cues to lean on walker did not allow for any increased ability to put more than toe-touch weight through R    Ambulation/Gait               General Gait Details: managed one very labored side step along EOB with heavy assist and guarding and extremely poor tolerance   Stairs             Wheelchair Mobility    Modified Rankin (Stroke Patients Only)       Balance Overall balance assessment: Needs assistance Sitting-balance support: Feet unsupported Sitting balance-Leahy Scale: Good     Standing balance support: Reliant on assistive device for balance, During functional activity, Bilateral upper extremity supported Standing balance-Leahy Scale: Poor Standing balance comment: patient dependent on RW for balance and unable to take weight through the heel                            Cognition Arousal/Alertness: Awake/alert Behavior During Therapy: WFL for tasks assessed/performed Overall Cognitive Status: Within Functional Limits for tasks assessed  Exercises      General Comments General comments (skin integrity, edema, etc.): Pt eager/willing to participate with PT but very much struggled with trying to take any weight through the R heel and despite reports that he typically walks a lot (at least 2 miles some days) he could not tolerate standing much less weight shifts/R WBing more than ~2 minutes today.  Pt with significant tenderness over the medial maleolus, also  reports heel hurting though certainly less tender than medial ankle      Pertinent Vitals/Pain Pain Assessment Pain Assessment: 0-10 Pain Score: 9  Pain Location: R ankle/heel Pain Intervention(s): Limited activity within patient's tolerance    Home Living                          Prior Function            PT Goals (current goals can now be found in the care plan section) Progress towards PT goals: Progressing toward goals    Frequency    Min 2X/week      PT Plan Current plan remains appropriate    Co-evaluation              AM-PAC PT "6 Clicks" Mobility   Outcome Measure  Help needed turning from your back to your side while in a flat bed without using bedrails?: A Little Help needed moving from lying on your back to sitting on the side of a flat bed without using bedrails?: A Little Help needed moving to and from a bed to a chair (including a wheelchair)?: A Lot Help needed standing up from a chair using your arms (e.g., wheelchair or bedside chair)?: A Little Help needed to walk in hospital room?: Total Help needed climbing 3-5 steps with a railing? : Total 6 Click Score: 13    End of Session Equipment Utilized During Treatment: Gait belt Activity Tolerance: Patient limited by pain Patient left: in bed;with nursing/sitter in room Nurse Communication: Mobility status;Patient requests pain meds PT Visit Diagnosis: Unsteadiness on feet (R26.81);Other abnormalities of gait and mobility (R26.89);Muscle weakness (generalized) (M62.81);Difficulty in walking, not elsewhere classified (R26.2);Pain Pain - Right/Left: Right Pain - part of body: Ankle and joints of foot     Time: 0915-0940 PT Time Calculation (min) (ACUTE ONLY): 25 min  Charges:  $Therapeutic Activity: 23-37 mins                     Malachi Pro, DPT 09/01/2021, 12:17 PM

## 2021-09-01 NOTE — ED Provider Notes (Signed)
Vitals:   09/01/21 0514 09/01/21 1105  BP: (!) 136/57 (!) 133/55  Pulse: 85 87  Resp: 17   Temp: 98.3 F (36.8 C) 98.4 F (36.9 C)  SpO2: 96% 98%    Patient is resting comfortably at this time.  Transition of care team has secured a rehabilitation for him in a place in [place] called [facility].  Discussed with the patient, he is aware of this plan and is agreeable with that.  Believes his brother would be able to drive him there for further and ongoing care.  Appears appropriate for ongoing treatment at a skilled nursing facility.  TOC team advised the patient has a healthcare power of attorney but does not have a "legal guardian" in place.  The patient is agreeable to this plan.  Reaching out to the patient's brother for assistance with discharge to SNF   Sharyn Creamer, MD 09/01/21 1232

## 2022-01-18 ENCOUNTER — Emergency Department: Payer: Medicare Other

## 2022-01-18 ENCOUNTER — Emergency Department
Admission: EM | Admit: 2022-01-18 | Discharge: 2022-01-20 | Disposition: A | Discharge status: Other Acute Inpatient Hospital | Payer: Medicare Other | Attending: Emergency Medicine | Admitting: Emergency Medicine

## 2022-01-18 ENCOUNTER — Other Ambulatory Visit: Payer: Self-pay

## 2022-01-18 DIAGNOSIS — E1165 Type 2 diabetes mellitus with hyperglycemia: Secondary | ICD-10-CM | POA: Diagnosis not present

## 2022-01-18 DIAGNOSIS — Z7982 Long term (current) use of aspirin: Secondary | ICD-10-CM | POA: Diagnosis not present

## 2022-01-18 DIAGNOSIS — Z91040 Latex allergy status: Secondary | ICD-10-CM | POA: Insufficient documentation

## 2022-01-18 DIAGNOSIS — E039 Hypothyroidism, unspecified: Secondary | ICD-10-CM | POA: Diagnosis not present

## 2022-01-18 DIAGNOSIS — Z20822 Contact with and (suspected) exposure to covid-19: Secondary | ICD-10-CM | POA: Insufficient documentation

## 2022-01-18 DIAGNOSIS — R600 Localized edema: Secondary | ICD-10-CM | POA: Insufficient documentation

## 2022-01-18 DIAGNOSIS — F203 Undifferentiated schizophrenia: Secondary | ICD-10-CM | POA: Diagnosis not present

## 2022-01-18 DIAGNOSIS — Z7984 Long term (current) use of oral hypoglycemic drugs: Secondary | ICD-10-CM | POA: Insufficient documentation

## 2022-01-18 DIAGNOSIS — R4182 Altered mental status, unspecified: Secondary | ICD-10-CM | POA: Diagnosis present

## 2022-01-18 DIAGNOSIS — I10 Essential (primary) hypertension: Secondary | ICD-10-CM | POA: Insufficient documentation

## 2022-01-18 DIAGNOSIS — J449 Chronic obstructive pulmonary disease, unspecified: Secondary | ICD-10-CM | POA: Insufficient documentation

## 2022-01-18 DIAGNOSIS — K429 Umbilical hernia without obstruction or gangrene: Secondary | ICD-10-CM | POA: Diagnosis not present

## 2022-01-18 DIAGNOSIS — Y900 Blood alcohol level of less than 20 mg/100 ml: Secondary | ICD-10-CM | POA: Diagnosis not present

## 2022-01-18 DIAGNOSIS — Z79899 Other long term (current) drug therapy: Secondary | ICD-10-CM | POA: Diagnosis not present

## 2022-01-18 DIAGNOSIS — Z794 Long term (current) use of insulin: Secondary | ICD-10-CM | POA: Diagnosis not present

## 2022-01-18 DIAGNOSIS — F209 Schizophrenia, unspecified: Secondary | ICD-10-CM | POA: Insufficient documentation

## 2022-01-18 DIAGNOSIS — L03115 Cellulitis of right lower limb: Secondary | ICD-10-CM | POA: Diagnosis not present

## 2022-01-18 LAB — URINALYSIS, COMPLETE (UACMP) WITH MICROSCOPIC
Bacteria, UA: NONE SEEN
Bilirubin Urine: NEGATIVE
Glucose, UA: NEGATIVE mg/dL
Hgb urine dipstick: NEGATIVE
Ketones, ur: 20 mg/dL — AB
Leukocytes,Ua: NEGATIVE
Nitrite: NEGATIVE
Protein, ur: NEGATIVE mg/dL
Specific Gravity, Urine: 1.011 (ref 1.005–1.030)
pH: 5 (ref 5.0–8.0)

## 2022-01-18 LAB — CBC WITH DIFFERENTIAL/PLATELET
Abs Immature Granulocytes: 0.03 10*3/uL (ref 0.00–0.07)
Basophils Absolute: 0 10*3/uL (ref 0.0–0.1)
Basophils Relative: 0 %
Eosinophils Absolute: 0 10*3/uL (ref 0.0–0.5)
Eosinophils Relative: 0 %
HCT: 39.6 % (ref 39.0–52.0)
Hemoglobin: 13.1 g/dL (ref 13.0–17.0)
Immature Granulocytes: 1 %
Lymphocytes Relative: 17 %
Lymphs Abs: 1.1 10*3/uL (ref 0.7–4.0)
MCH: 26.6 pg (ref 26.0–34.0)
MCHC: 33.1 g/dL (ref 30.0–36.0)
MCV: 80.3 fL (ref 80.0–100.0)
Monocytes Absolute: 0.4 10*3/uL (ref 0.1–1.0)
Monocytes Relative: 7 %
Neutro Abs: 4.5 10*3/uL (ref 1.7–7.7)
Neutrophils Relative %: 75 %
Platelets: 189 10*3/uL (ref 150–400)
RBC: 4.93 MIL/uL (ref 4.22–5.81)
RDW: 13.2 % (ref 11.5–15.5)
WBC: 6.1 10*3/uL (ref 4.0–10.5)
nRBC: 0 % (ref 0.0–0.2)

## 2022-01-18 LAB — URINE DRUG SCREEN, QUALITATIVE (ARMC ONLY)
Amphetamines, Ur Screen: NOT DETECTED
Barbiturates, Ur Screen: NOT DETECTED
Benzodiazepine, Ur Scrn: NOT DETECTED
Cannabinoid 50 Ng, Ur ~~LOC~~: NOT DETECTED
Cocaine Metabolite,Ur ~~LOC~~: NOT DETECTED
MDMA (Ecstasy)Ur Screen: NOT DETECTED
Methadone Scn, Ur: NOT DETECTED
Opiate, Ur Screen: NOT DETECTED
Phencyclidine (PCP) Ur S: NOT DETECTED
Tricyclic, Ur Screen: NOT DETECTED

## 2022-01-18 LAB — SALICYLATE LEVEL: Salicylate Lvl: <7 mg/dL — ABNORMAL LOW (ref 7.0–30.0)

## 2022-01-18 LAB — COMPREHENSIVE METABOLIC PANEL
ALT: 26 U/L (ref 0–44)
AST: 40 U/L (ref 15–41)
Albumin: 3.9 g/dL (ref 3.5–5.0)
Alkaline Phosphatase: 82 U/L (ref 38–126)
Anion gap: 12 (ref 5–15)
BUN: 12 mg/dL (ref 6–20)
CO2: 26 mmol/L (ref 22–32)
Calcium: 8.9 mg/dL (ref 8.9–10.3)
Chloride: 102 mmol/L (ref 98–111)
Creatinine, Ser: 0.84 mg/dL (ref 0.61–1.24)
GFR, Estimated: >60 mL/min (ref >60)
Glucose, Bld: 163 mg/dL — ABNORMAL HIGH (ref 70–99)
Potassium: 3.6 mmol/L (ref 3.5–5.1)
Sodium: 140 mmol/L (ref 135–145)
Total Bilirubin: 1 mg/dL (ref 0.3–1.2)
Total Protein: 6.5 g/dL (ref 6.5–8.1)

## 2022-01-18 LAB — SARS CORONAVIRUS 2 BY RT PCR: SARS Coronavirus 2 by RT PCR: NEGATIVE

## 2022-01-18 LAB — ACETAMINOPHEN LEVEL: Acetaminophen (Tylenol), Serum: <10 ug/mL — ABNORMAL LOW (ref 10–30)

## 2022-01-18 LAB — CBG MONITORING, ED: Glucose-Capillary: 149 mg/dL — ABNORMAL HIGH (ref 70–99)

## 2022-01-18 LAB — PROTIME-INR
INR: 1 (ref 0.8–1.2)
Prothrombin Time: 12.9 seconds (ref 11.4–15.2)

## 2022-01-18 LAB — AMMONIA: Ammonia: <10 umol/L (ref 9–35)

## 2022-01-18 LAB — VALPROIC ACID LEVEL: Valproic Acid Lvl: <10 ug/mL — ABNORMAL LOW (ref 50.0–100.0)

## 2022-01-18 LAB — ETHANOL: Alcohol, Ethyl (B): <10 mg/dL (ref <10)

## 2022-01-18 LAB — BRAIN NATRIURETIC PEPTIDE: B Natriuretic Peptide: 34.9 pg/mL (ref 0.0–100.0)

## 2022-01-18 LAB — TSH: TSH: 2.938 u[IU]/mL (ref 0.350–4.500)

## 2022-01-18 MED ORDER — CEFADROXIL 500 MG PO CAPS
1000.0000 mg | ORAL_CAPSULE | Freq: Two times a day (BID) | ORAL | Status: DC
  Administered 2022-01-18: 1000 mg via ORAL
  Filled 2022-01-18 (×2): qty 2

## 2022-01-18 NOTE — ED Notes (Signed)
Pt received snack, pt asked for this tech to turn off pt light because it is making him itch. This tech turned light off and left little light one. Pt currently eating snack at the moment and has no other needs right now.

## 2022-01-18 NOTE — ED Notes (Deleted)
VOL/  PENDING  CONSULT 

## 2022-01-18 NOTE — BH Assessment (Signed)
Comprehensive Clinical Assessment (CCA) Note  01/18/2022 Derrick Atkins [id number]  Derrick Atkins 51 year old male who presents to [facility] ED voluntarily for treatment. Per triage note, Pt arrived to ED via [facility] with c/o AMS who was initially called to check pt CBG, which was 162. Pt told EMS he has been off his medications for the past month. Pt lethargic and weak during triage. Difficult to arouse and slow to answer questions.   During TTS assessment pt presents alert and oriented x 4, anxious but cooperative, and mood-congruent with affect. The pt does not appear to be responding to internal or external stimuli. Neither is the pt presenting with any delusional thinking. Pt verified the information provided to triage RN.   Pt identifies his main complaint to be that he has been off his medications for some time but could not explain why. Patient reports he lives alone and has a legal guardian, [name]. Patient has schizophrenia and says the guardian was taking him to get a TB test. Patient states he has not been sleeping or eating well over the past couple of days. Patient presents with tangential speech and does not make eye contact. Patient denies using any illicit substances and alcohol. Pt reports a medical hx of diabetes. Pt denies current SI/HI. Pt provided his brother, [name] as a collateral contact.    Psych Team spoke with patient's brother, [name] [phone] who states he is the patient's only living relative. [name] reports legal guardian has [employer] Mccallum Duty this week and unable to talk during the day. [name] states he does not want patient to return home as he is not able to care for himself. He reports working with Arlys John to find adequate housing, a group home. Psych NP left message on [name]'s voicemail to return call.     Per Sallye Ober, NP, pt is recommended for inpatient psychiatric admission.    Chief Complaint:  Chief Complaint  Patient presents with   Altered Mental Status    Visit Diagnosis: Schizophrenia    CCA Screening, Triage and Referral (STR)  Patient Reported Information How did you hear about [place]? Self  Referral name: No data recorded Referral phone number: No data recorded  Whom do you see for routine medical problems? No data recorded Practice/Facility Name: No data recorded Practice/Facility Phone Number: No data recorded Name of Contact: No data recorded Contact Number: No data recorded Contact Fax Number: No data recorded Prescriber Name: No data recorded Prescriber Address (if known): No data recorded  What Is the Reason for Your Visit/Call Today? Patient called EMS. Patient has been off psych medications for several months.  How Long Has This Been Causing You Problems? <Week  What Do You Feel Would Help You the Most Today? Medication(s)   Have You Recently Been in Any Inpatient Treatment (Hospital/Detox/Crisis Center/28-Day Program)? No data recorded Name/Location of Program/Hospital:No data recorded How Long Were You There? No data recorded When Were You Discharged? No data recorded  Have You Ever Received Services From [facility] Before? No data recorded Who Do You See at [facility]? No data recorded  Have You Recently Had Any Thoughts About Hurting Yourself? No  Are You Planning to Commit Suicide/Harm Yourself At This time? No   Have you Recently Had Thoughts About Hurting Someone [name]? No  Explanation: No data recorded  Have You Used Any Alcohol or Drugs in the Past 24 Hours? No  How Long Ago Did You Use Drugs or Alcohol? No data recorded  What Did You Use and How Much? No data recorded  Do You Currently Have a Therapist/Psychiatrist? No data recorded Name of Therapist/Psychiatrist: No data recorded  Have You Been Recently Discharged From Any Office Practice or Programs? No  Explanation of Discharge From Practice/Program: No data recorded    CCA Screening Triage Referral Assessment Type of Contact:  Face-to-Face  Is this Initial or Reassessment? No data recorded Date Telepsych consult ordered in [facility]:  No data recorded Time Telepsych consult ordered in [facility]:  No data recorded  Patient Reported Information Reviewed? No data recorded Patient Left Without Being Seen? No data recorded Reason for Not Completing Assessment: No data recorded  Collateral Involvement: Pratyush Ammon- brother [phone]   Does Patient Have a Court Appointed Legal Guardian? No data recorded Name and Contact of Legal Guardian: No data recorded If Minor and Not Living with Parent(s), Who has Custody? No data recorded Is CPS involved or ever been involved? Never  Is APS involved or ever been involved? Never   Patient Determined To Be At Risk for Harm To Self or Others Based on Review of Patient Reported Information or Presenting Complaint? No  Method: No data recorded Availability of Means: No data recorded Intent: No data recorded Notification Required: No data recorded Additional Information for Danger to Others Potential: No data recorded Additional Comments for Danger to Others Potential: No data recorded Are There Guns or Other Weapons in Your Home? No data recorded Types of Guns/Weapons: No data recorded Are These Weapons Safely Secured?                            No data recorded Who Could Verify You Are Able To Have These Secured: No data recorded Do You Have any Outstanding Charges, Pending Court Dates, Parole/Probation? No data recorded Contacted To Inform of Risk of Harm To Self or Others: No data recorded  Location of Assessment: [facility] ED   Does Patient Present under Involuntary Commitment? No  IVC Papers Initial File Date: No data recorded  [place] of Residence: [place]   Patient Currently Receiving the Following Services: Medication Management   Determination of Need: Emergent (2 hours)   Options For Referral: ED Visit; Inpatient Hospitalization; Medication Management; Group  Home     CCA Biopsychosocial Intake/Chief Complaint:  No data recorded Current Symptoms/Problems: No data recorded  Patient Reported Schizophrenia/Schizoaffective Diagnosis in Past: Yes   Strengths: Patient able to communicate and verbalize needs.  Preferences: No data recorded Abilities: No data recorded  Type of Services Patient Feels are Needed: No data recorded  Initial Clinical Notes/Concerns: No data recorded  Mental Health Symptoms Depression:  No data recorded  Duration of Depressive symptoms: No data recorded  Mania:   None   Anxiety:    Difficulty concentrating; Sleep   Psychosis:   None   Duration of Psychotic symptoms: No data recorded  Trauma:   None   Obsessions:   None   Compulsions:   None   Inattention:   Disorganized   Hyperactivity/Impulsivity:   None   Oppositional/Defiant Behaviors:   None   Emotional Irregularity:   None   Other Mood/Personality Symptoms:  No data recorded   Mental Status Exam Appearance and self-care  Stature:   Average   Weight:   Average weight   Clothing:   Disheveled   Grooming:   Neglected   Cosmetic use:   None   Posture/gait:   Tense; Rigid  Motor activity:   Not Remarkable   Sensorium  Attention:   Unaware   Concentration:   Focuses on irrelevancies   Orientation:   X5   Recall/memory:   Normal   Affect and Mood  Affect:   Flat   Mood:   Anxious   Relating  Eye contact:   None   Facial expression:   Tense   Attitude toward examiner:   Cooperative   Thought and Language  Speech flow:  Articulation error   Thought content:   Appropriate to Mood and Circumstances   Preoccupation:   None   Hallucinations:   None   Organization:  No data recorded  [employer] of Knowledge:   Average   Intelligence:   Average   Abstraction:   Functional   Judgement:   Impaired   Reality Testing:   Adequate   Insight:   Fair   Decision  Making:   Paralyzed   Social Functioning  Social Maturity:   Isolates   Social Judgement:   Normal   Stress  Stressors:   Illness; Housing   Coping Ability:   Exhausted   Skill Deficits:   Decision making   Supports:   Family; Friends/Service system     Religion:    Leisure/Recreation:    Exercise/Diet: Exercise/Diet Do You Have Any Trouble Sleeping?: Yes Explanation of Sleeping Difficulties: Patient reports poor sleep habits.   CCA Employment/Education Employment/Work Situation:    Education:     CCA Family/Childhood History Family and Relationship History:    Childhood History:     Child/Adolescent Assessment:     CCA Substance Use Alcohol/Drug Use: Alcohol / Drug Use Pain Medications: See PTA Prescriptions: See PTA Over the Counter: See PTA History of alcohol / drug use?: No history of alcohol / drug abuse                         ASAM's:  Six Dimensions of Multidimensional Assessment  Dimension 1:  Acute Intoxication and/or Withdrawal Potential:      Dimension 2:  Biomedical Conditions and Complications:      Dimension 3:  Emotional, Behavioral, or Cognitive Conditions and Complications:     Dimension 4:  Readiness to Change:     Dimension 5:  Relapse, Continued use, or Continued Problem Potential:     Dimension 6:  Recovery/Living Environment:     ASAM Severity Score:    ASAM Recommended Level of Treatment:     Substance use Disorder (SUD)    Recommendations for Services/Supports/Treatments:    DSM5 Diagnoses: Patient Active Problem List   Diagnosis Date Noted   Small bowel obstruction (HCC) 05/13/2020   Chronic low back pain without sciatica 12/09/2017   History of scoliosis 12/09/2017   Schizophrenia, paranoid, chronic (HCC)    Long QT interval 04/16/2014   Hypertriglyceridemia 04/02/2014   Long-term insulin use (HCC) 04/02/2014   Iron deficiency anemia, unspecified 12/31/2013   Hypothyroidism 12/25/2013    DIABETES MELLITUS 05/03/2007   Schizophrenia (HCC) 05/03/2007   HYPERTENSION 05/03/2007   PNEUMONITIS DUE TO FOOD/VOMIT INHALATION 05/03/2007   PECTUS EXCAVATUM 05/03/2007   Type 2 diabetes mellitus without complications (HCC) 05/03/2007    Patient Centered Plan: Patient is on the following Treatment Plan(s):  Schizophrenia   Referrals to Alternative Service(s): Referred to Alternative Service(s):   Place:   Date:   Time:    Referred to Alternative Service(s):   Place:   Date:  Time:    Referred to Alternative Service(s):   Place:   Date:   Time:    Referred to Alternative Service(s):   Place:   Date:   Time:      @BHCOLLABOFCARE @  Byard Carranza R , Counselor, LCAS-A

## 2022-01-18 NOTE — ED Provider Notes (Addendum)
[facility] Provider Note    Event Date/Time   First MD Initiated Contact with Patient 01/18/22 1211     (approximate)   History   Altered Mental Status   HPI  Derrick Atkins is a 51 y.o. male  with history of cerebral palsy, hypertension, diabetes, schizoaffective disorder, COPD, and kidney stones who presents for evaluation of altered mental status.  Patient reportedly lives in apartment on his own and occasionally has a nurse come to check on him does have a legal guardian.  I attempted to reach legal guardian but this went to voicemail and eventually reach patient's brother informing that patient legal guardian is in your ED today and will not be able to provide any additional history.  Patient's brother states that patient will sometimes not take his medication to "throw tantrum" but he was not aware of any other recent sick symptoms.  Patient is unable to state why he is in the emergency room other than denying he is in acute pain.  He is not oriented to year and place but not date recent events.  He is unable to state if he is taking his medications.  He is unable to provide any additional history at this time.      Physical Exam  Triage Vital Signs: ED Triage Vitals  Enc Vitals Group     BP      Pulse      Resp      Temp      Temp src      SpO2      Weight      Height      Head Circumference      Peak Flow      Pain Score      Pain Loc      Pain Edu?      Excl. in GC?     Most recent vital signs: Vitals:   01/18/22 1214  BP: (!) 145/74  Pulse: 91  Resp: 20  Temp: 99.7 F (37.6 C)  SpO2: 93%    General: Awake, no distress.  CV:  Good peripheral perfusion.  2+ radial pulses.  2+ PT pulses.   Resp:  Normal effort.  Clear bilaterally. Abd:  No distention.  Soft.  He is a reducible umbilical hernia. Other:  Bilateral lower extremity edema with some erythema around the right ankle and right calf.  No  PERRLA.  EOMI.  Patient does  seem weak in his right upper extremity which seems chronic from his cerebral palsy and withdraws all other extremities but does not otherwise participate in exam although is noted to spontaneously move his left upper extremity.  ED Results / Procedures / Treatments  Labs (all labs ordered are listed, but only abnormal results are displayed) Labs Reviewed  SALICYLATE LEVEL - Abnormal; Notable for the following components:      Result Value   Salicylate Lvl <7.0 (*)    All other components within normal limits  ACETAMINOPHEN LEVEL - Abnormal; Notable for the following components:   Acetaminophen (Tylenol), Serum <10 (*)    All other components within normal limits  VALPROIC ACID LEVEL - Abnormal; Notable for the following components:   Valproic Acid Lvl <10 (*)    All other components within normal limits  COMPREHENSIVE METABOLIC PANEL - Abnormal; Notable for the following components:   Glucose, Bld 163 (*)    All other components within normal limits  CBG MONITORING, ED - Abnormal;  Notable for the following components:   Glucose-Capillary 149 (*)    All other components within normal limits  SARS CORONAVIRUS 2 BY RT PCR  AMMONIA  ETHANOL  PROTIME-INR  BRAIN NATRIURETIC PEPTIDE  CBC WITH DIFFERENTIAL/PLATELET  TSH  URINALYSIS, COMPLETE (UACMP) WITH MICROSCOPIC  CBC WITH DIFFERENTIAL/PLATELET  URINE DRUG SCREEN, QUALITATIVE ([facility] ONLY)     EKG  EKG is remarkable for sinus rhythm with ventricular rate of 87, normal axis, QTc interval of 500 without clear evidence of acute ischemia or significant arrhythmia.   RADIOLOGY  CT head on my interpretation shows chronically appearing enlarged ventricles with evidence of hemorrhage, acute ischemia, edema or mass effect.  I reviewed radiologist interpretation and agree their findings of chronic ex-vacuo enlargement of the left lateral ventricle due to distant deep white matter infarct unchanged from 2016.  Chest reviewed by myself shows  no focal consoidation, effusion, edema, pneumothorax or other clear acute thoracic process. I also reviewed radiology interpretation and agree with findings described.  Bilateral lower extremity ultrasound my interpretation without evidence of a DVT.  Also reviewed radiology's interpretation.  PROCEDURES:  Critical Care performed: No  Procedures    MEDICATIONS ORDERED IN ED: Medications - No data to display   IMPRESSION / MDM / ASSESSMENT AND PLAN / ED COURSE  I reviewed the triage vital signs and the nursing notes. Patient's presentation is most consistent with acute presentation with potential threat to life or bodily function.                               Differential diagnosis includes, but is not limited to, CVA, metabolic derangements, intoxication, decompensated schizophrenia for medication noncompliance and acute infectious process, hepatic encephalopathy and hypothyroidism.  EKG is remarkable for sinus rhythm with ventricular rate of 87, normal axis, QTc interval of 500 without clear evidence of acute ischemia or significant arrhythmia.  CT head on my interpretation shows chronically appearing enlarged ventricles with evidence of hemorrhage, acute ischemia, edema or mass effect.  I reviewed radiologist interpretation and agree their findings of chronic ex-vacuo enlargement of the left lateral ventricle due to distant deep white matter infarct unchanged from 2016.  Chest reviewed by myself shows no focal consoidation, effusion, edema, pneumothorax or other clear acute thoracic process. I also reviewed radiology interpretation and agree with findings described.  Bilateral lower extremity ultrasound my interpretation without evidence of a DVT.  Also reviewed radiology's interpretation.  Serum ethanol undetectable.  COVID PCR is negative.  Acetaminophen and salicylates undetectable.  Depakote undetectable.  INR 1.  BMP 34 not suggestive of acute systolic heart failure.  CBC  without leukocytosis or acute anemia.  TSH and ammonia are unremarkable.  CMP without significant electrolyte or metabolic derangements.  Concern for possible catatonia in the setting medication noncompliance versus other endocrinology for his altered mental status.  Care patient signed over to same provider with plan to follow-up urine and psychiatry as I have consulted psychiatry to see if they think part of this presentation can be contributing to his decompensated schizophrenia and medication noncompliance.  If he is deemed not a candidate for inpatient or to have not primary psychiatry cause for his presentation today I think he should be admitted to medicine service for evaluation management of his altered mental status.  Suspect erythema in the right lower extremity is likely cellulitis we will start patient on cefadroxil for this pending work-up.  In addition we will order  PT and OT consults for evaluation.     FINAL CLINICAL IMPRESSION(S) / ED DIAGNOSES   Final diagnoses:  Altered mental status, unspecified altered mental status type  Cellulitis of right lower extremity     Rx / DC Orders   ED Discharge Orders     None        Note:  This document was prepared using Dragon voice recognition software and may include unintentional dictation errors.   Gilles Chiquito, MD 01/18/22 1606    Gilles Chiquito, MD 01/18/22 [phone]

## 2022-01-18 NOTE — Consult Note (Signed)
[facility] Face-to-Face Psychiatry Consult   Reason for Consult:  AMS Referring Physician:  EDP Patient Identification: Derrick Atkins MRN:  [id number] Principal Diagnosis: Schizophrenia [facility]) Diagnosis:  Principal Problem:   Schizophrenia (HCC)   Total Time spent with patient: 45 minutes  Subjective:   Derrick Atkins is a 51 y.o. male patient admitted with AMS.  HPI:  Patient with hx of schizophrenia called EMS today for an unclear reason. On evaluation, patient is alert to name, place, year. Patient is tangential, has irrelevant speech. When asked why he is hear, he repeats his name 3 times. He agrees that he has schizophrenia and has not been taking his medication, but cannot tell writer why he stopped taking it. He reports that he lives alone x 14 years and he wants to move, but "they won't let me."  He said [name]  [name], his guardian) took him  to get a TB test. He denies SI/HI. He does not make eye contact, starring straight up with stiff body. Endorses poor sleep and appetite. Denies illicit drug use.  Collateral from brother, Jacek Colson, [phone]: He states his guardian [name] is in [employer] duty this week and not available in the day.  He reports she has been trying to get him into a group home. He feels that patient is not able to care for himself. I asked [name] to send a message to Ms. [name] so that we can verify his meds and get him back on appropriate does.  Writer left HIPAA compliant voice message with Ms Melvyn Neth to call and verify medications [phone])  Patient will need to be hospitalized for stabilization. Patient has guardian. Attempts  have been made to contact guardian.     Past Psychiatric History: schizophrenia  Risk to Self:   Risk to Others:   Prior Inpatient Therapy:   Prior Outpatient Therapy:    Past Medical History:  Past Medical History:  Diagnosis Date   Cerebral palsy (HCC)    Depression    Diabetes mellitus without complication  (HCC)    Hypertension    Kidney stones    Schizoaffective disorder (HCC)    Scoliosis     Past Surgical History:  Procedure Laterality Date   BOWEL RESECTION     LAPAROTOMY N/A 05/15/2020   Procedure: EXPLORATORY LAPAROTOMY;  Surgeon: Henrene Dodge, MD;  Location: [facility] ORS;  Service: General;  Laterality: N/A;   Family History:  Family History  Problem Relation Age of Onset   Heart disease Father    Heart attack Father    Family Psychiatric  History: unknown Social History:  Social History   Substance and Sexual Activity  Alcohol Use No     Social History   Substance and Sexual Activity  Drug Use No    Social History   Socioeconomic History   Marital status: Single    Spouse name: Not on file   Number of children: Not on file   Years of education: Not on file   Highest education level: Not on file  Occupational History   Not on file  Tobacco Use   Smoking status: Never   Smokeless tobacco: Never  Vaping Use   Vaping Use: Never used  Substance and Sexual Activity   Alcohol use: No   Drug use: No   Sexual activity: Not on file  Other Topics Concern   Not on file  Social History Narrative   Not on file   Social Determinants of Health  Financial Resource Strain: Not on file  Food Insecurity: Not on file  Transportation Needs: Not on file  Physical Activity: Not on file  Stress: Not on file  Social Connections: Not on file   Additional Social History:    Allergies:   Allergies  Allergen Reactions   Phenytoin Sodium Extended Other (See Comments) and Nausea And Vomiting   Prednisone Other (See Comments)   Latex Hives, Nausea And Vomiting and Rash    Labs:  Results for orders placed or performed during the hospital encounter of 01/18/22 (from the past 48 hour(s))  CBG monitoring, ED     Status: Abnormal   Collection Time: 01/18/22 12:21 PM  Result Value Ref Range   Glucose-Capillary 149 (H) 70 - 99 mg/dL    Comment: Glucose reference range  applies only to samples taken after fasting for at least 8 hours.  Ethanol     Status: None   Collection Time: 01/18/22 12:31 PM  Result Value Ref Range   Alcohol, Ethyl (B) <10 <10 mg/dL    Comment: (NOTE) Lowest detectable limit for serum alcohol is 10 mg/dL.  For medical purposes only. Performed at [facility], [address] [place], [place] [address]   SARS Coronavirus 2 by RT PCR (hospital order, performed in [facility] hospital lab) *cepheid single result test* Anterior Nasal Swab     Status: None   Collection Time: 01/18/22 12:31 PM   Specimen: Anterior Nasal Swab  Result Value Ref Range   SARS Coronavirus 2 by RT PCR NEGATIVE NEGATIVE    Comment: (NOTE) SARS-CoV-2 target nucleic acids are NOT DETECTED.  The SARS-CoV-2 RNA is generally detectable in upper and lower respiratory specimens during the acute phase of infection. The lowest concentration of SARS-CoV-2 viral copies this assay can detect is 250 copies / mL. A negative result does not preclude SARS-CoV-2 infection and should not be used as the sole basis for treatment or other patient management decisions.  A negative result may occur with improper specimen collection / handling, submission of specimen other than nasopharyngeal swab, presence of viral mutation(s) within the areas targeted by this assay, and inadequate number of viral copies (<250 copies / mL). A negative result must be combined with clinical observations, patient history, and epidemiological information.  Fact Sheet for Patients:   RoadLapTop.co.zahttps://www.fda.gov/media/158405/download  Fact Sheet for Healthcare Providers: http://kim-miller.com/https://www.fda.gov/media/158404/download  This test is not yet approved or  cleared by the [place] FDA and has been authorized for detection and/or diagnosis of SARS-CoV-2 by FDA under an Emergency Use Authorization (EUA).  This EUA will remain in effect (meaning this test can be used) for the duration of the COVID-19  declaration under Section 564(b)(1) of the Act, 21 U.S.C. section 360bbb-3(b)(1), unless the authorization is terminated or revoked sooner.  Performed at [facility], [address] [place], [place] [address]   Salicylate level     Status: Abnormal   Collection Time: 01/18/22 12:31 PM  Result Value Ref Range   Salicylate Lvl <7.0 (L) 7.0 - 30.0 mg/dL    Comment: Performed at [facility], [address] [place], [place] [address]  Acetaminophen level     Status: Abnormal   Collection Time: 01/18/22 12:31 PM  Result Value Ref Range   Acetaminophen (Tylenol), Serum <10 (L) 10 - 30 ug/mL    Comment: (NOTE) Therapeutic concentrations vary significantly. A range of 10-30 ug/mL  may be an effective concentration for many patients. However, some  are best treated  at concentrations outside of this range. Acetaminophen concentrations >150 ug/mL at 4 hours after ingestion  and >50 ug/mL at 12 hours after ingestion are often associated with  toxic reactions.  Performed at [facility], [address] [place], [place] [address]   Valproic acid level     Status: Abnormal   Collection Time: 01/18/22 12:31 PM  Result Value Ref Range   Valproic Acid Lvl <10 (L) 50.0 - 100.0 ug/mL    Comment: RESULT CONFIRMED BY MANUAL DILUTION DE/HNM Performed at [facility], [address] [place], [place] [address]   Protime-INR     Status: None   Collection Time: 01/18/22 12:31 PM  Result Value Ref Range   Prothrombin Time 12.9 11.4 - 15.2 seconds   INR 1.0 0.8 - 1.2    Comment: (NOTE) INR goal varies based on device and disease states. Performed at [facility], [address] [place], [place] [address]   Brain natriuretic peptide     Status: None   Collection Time: 01/18/22  1:14 PM  Result Value Ref Range   B Natriuretic Peptide 34.9 0.0 - 100.0 pg/mL    Comment: Performed at [facility], [address] [place], [place]  [address]  CBC with Differential/Platelet     Status: None   Collection Time: 01/18/22  1:14 PM  Result Value Ref Range   WBC 6.1 4.0 - 10.5 K/uL   RBC 4.93 4.22 - 5.81 MIL/uL   Hemoglobin 13.1 13.0 - 17.0 g/dL   HCT 42.5 95.6 - 38.7 %   MCV 80.3 80.0 - 100.0 fL   MCH 26.6 26.0 - 34.0 pg   MCHC 33.1 30.0 - 36.0 g/dL   RDW 56.4 33.2 - 95.1 %   Platelets 189 150 - 400 K/uL   nRBC 0.0 0.0 - 0.2 %   Neutrophils Relative % 75 %   Neutro Abs 4.5 1.7 - 7.7 K/uL   Lymphocytes Relative 17 %   Lymphs Abs 1.1 0.7 - 4.0 K/uL   Monocytes Relative 7 %   Monocytes Absolute 0.4 0.1 - 1.0 K/uL   Eosinophils Relative 0 %   Eosinophils Absolute 0.0 0.0 - 0.5 K/uL   Basophils Relative 0 %   Basophils Absolute 0.0 0.0 - 0.1 K/uL   Immature Granulocytes 1 %   Abs Immature Granulocytes 0.03 0.00 - 0.07 K/uL    Comment: Performed at [facility], [address] [place], [place] [address]  Comprehensive metabolic panel     Status: Abnormal   Collection Time: 01/18/22  1:14 PM  Result Value Ref Range   Sodium 140 135 - 145 mmol/L   Potassium 3.6 3.5 - 5.1 mmol/L   Chloride 102 98 - 111 mmol/L   CO2 26 22 - 32 mmol/L   Glucose, Bld 163 (H) 70 - 99 mg/dL    Comment: Glucose reference range applies only to samples taken after fasting for at least 8 hours.   BUN 12 6 - 20 mg/dL   Creatinine, Ser 6.06 0.61 - 1.24 mg/dL   Calcium 8.9 8.9 - 30.1 mg/dL   Total Protein 6.5 6.5 - 8.1 g/dL   Albumin 3.9 3.5 - 5.0 g/dL   AST 40 15 - 41 U/L   ALT 26 0 - 44 U/L   Alkaline Phosphatase 82 38 - 126 U/L   Total Bilirubin 1.0 0.3 - 1.2 mg/dL   GFR, Estimated >60 >10 mL/min    Comment: (NOTE) Calculated using the  CKD-EPI Creatinine Equation (2021)    Anion gap 12 5 - 15    Comment: Performed at [facility], [address] [place], [place] [address]  TSH     Status: None   Collection Time: 01/18/22  1:14 PM  Result Value Ref Range   TSH 2.938 0.350 - 4.500 uIU/mL    Comment: Performed by a  3rd Generation assay with a functional sensitivity of <=0.01 uIU/mL. Performed at [facility], [address] [place], [place] [address]   Ammonia     Status: None   Collection Time: 01/18/22  1:15 PM  Result Value Ref Range   Ammonia <10 9 - 35 umol/L    Comment: Performed at [facility], [address] [place], [place] [address]    Current Facility-Administered Medications  Medication Dose Route Frequency Provider Last Rate Last Admin   cefadroxil (DURICEF) capsule 1,000 mg  1,000 mg Oral BID Gilles Chiquito, MD       Current Outpatient Medications  Medication Sig Dispense Refill   ARIPiprazole (ABILIFY) 20 MG tablet Take 1 tablet by mouth daily.      aspirin 81 MG EC tablet Take 1 tablet by mouth daily.     buPROPion (WELLBUTRIN XL) 300 MG 24 hr tablet Take 300 mg by mouth daily.     divalproex (DEPAKOTE) 500 MG DR tablet Take 1,000 mg by mouth 2 (two) times daily.      gemfibrozil (LOPID) 600 MG tablet Take 600 mg by mouth 2 (two) times daily.     levothyroxine (SYNTHROID, LEVOTHROID) 88 MCG tablet Take 88 mcg by mouth daily.     lisinopril (PRINIVIL,ZESTRIL) 10 MG tablet Take 1 tablet by mouth daily.     metFORMIN (GLUCOPHAGE) 1000 MG tablet Take 1,000 mg by mouth 2 (two) times daily with a meal.     pioglitazone (ACTOS) 45 MG tablet Take 45 mg by mouth daily.     simvastatin (ZOCOR) 20 MG tablet Take 20 mg by mouth at bedtime.     cyclobenzaprine (FLEXERIL) 5 MG tablet Take 1 tablet (5 mg total) by mouth at bedtime. (Patient not taking: Reported on 01/18/2022) 30 tablet 0   lidocaine (LIDODERM) 5 % Place 1 patch onto the skin every 12 (twelve) hours. Remove & Discard patch within 12 hours or as directed by MD (Patient not taking: Reported on 01/18/2022) 15 patch 0   naproxen (NAPROSYN) 500 MG tablet Take 1 tablet (500 mg total) by mouth 2 (two) times daily with a meal. (Patient not taking: Reported on 01/18/2022) 20 tablet 0   NOVOLOG MIX 70/30 FLEXPEN (70-30)  100 UNIT/ML FlexPen Inject 34-44 Units into the skin 2 (two) times daily. 34 units-am 44units-pm     polyethylene glycol (MIRALAX / GLYCOLAX) 17 g packet Take 17 g by mouth daily. (Patient not taking: Reported on 08/30/2021) 14 each 0    Musculoskeletal: Strength & Muscle Tone:  did not assess Gait & Station:  did not assess Patient leans: N/A   Psychiatric Specialty Exam:  Presentation  General Appearance: Casual  Eye Contact:Absent  Speech:Blocked; Slow  Speech Volume:Normal  Handedness:No data recorded  Mood and Affect  Mood:Dysphoric  Affect:Blunt   Thought Process  Thought Processes:Disorganized; Irrevelant  Descriptions of Associations:No data recorded Orientation:Partial  Thought Content:Scattered; Tangential  History of Schizophrenia/Schizoaffective disorder:No data recorded Duration of Psychotic Symptoms:No data recorded Hallucinations:Hallucinations: Other (comment) (Denies may be rtis)  Ideas of Reference:Paranoia (possible paranoid ideas)  Suicidal Thoughts:Suicidal Thoughts: No  Homicidal Thoughts:Homicidal Thoughts: No   Sensorium  Memory:Immediate Poor  Judgment:Impaired  Insight:Lacking   Executive Functions  Concentration:Poor  Attention Span:Poor  Recall:Poor  Fund of Knowledge:Poor  Language:Poor   Psychomotor Activity  Psychomotor Activity:Psychomotor Activity: Normal   Assets  Assets:Financial Resources/Insurance; Housing; Resilience   Sleep  Sleep:Sleep: Poor   Physical Exam: Physical Exam Vitals and nursing note reviewed.  HENT:     Head: Normocephalic.     Nose: No congestion or rhinorrhea.  Eyes:     General:        Right eye: No discharge.        Left eye: No discharge.  Cardiovascular:     Rate and Rhythm: Normal rate.  Pulmonary:     Effort: Pulmonary effort is normal.  Musculoskeletal:        General: Normal range of motion.     Cervical back: Normal range of motion.  Skin:    General: Skin  is dry.  Neurological:     Mental Status: He is alert and oriented to person, place, and time.  Psychiatric:        Attention and Perception: He is inattentive.        Mood and Affect: Affect is blunt.        Speech: Speech is delayed.        Behavior: Behavior is slowed.        Thought Content: Thought content is delusional (possible).        Cognition and Memory: Cognition is impaired.        Judgment: Judgment is impulsive.     Comments: schizophrenia    Review of Systems  Constitutional: Negative.   HENT: Negative.    Cardiovascular: Negative.   Skin: Negative.   Psychiatric/Behavioral:  Negative for hallucinations (possible delusions), substance abuse and suicidal ideas. The patient is not nervous/anxious.        Schizophrenia   Blood pressure (!) 145/74, pulse 91, temperature 99.7 F (37.6 C), temperature source Axillary, resp. rate 20, height 6\' 4"  (1.93 m), SpO2 93 %. Body mass index is 30.83 kg/m.  Treatment Plan Summary: Daily contact with patient to assess and evaluate symptoms and progress in treatment and Medication management. Guardian needs to be contacted to verify medications. Reviewed with EDP  Disposition: Recommend psychiatric Inpatient admission when medically cleared.  , NP 01/18/2022 4:43 PM

## 2022-01-18 NOTE — ED Triage Notes (Signed)
Pt arrived to ED via [facility] with c/o AMS who was initially called to check pt CBG, which was 162. Pt told EMS he has been off his medications for the past month. Pt lethargic and weak during triage. Difficult to arouse and slow to answer questions. AO X 4. EMS VS: BP180/70 HR87 O2 97%.

## 2022-01-18 NOTE — ED Notes (Signed)
VOL  CONSULT  DONE  PENDING  PLACEMENT 

## 2022-01-18 NOTE — ED Notes (Signed)
VOL/  PENDING  CONSULT 

## 2022-01-18 NOTE — ED Provider Notes (Signed)
Procedures     ----------------------------------------- 7:14 PM on 01/18/2022 -----------------------------------------   Evaluation discussed with psychiatry.  Tentative plan is for psychiatry admission.   Sharman Cheek, MD 01/18/22 [phone]

## 2022-01-18 NOTE — ED Notes (Addendum)
Pt to Xray via wheelchair.

## 2022-01-18 NOTE — ED Notes (Signed)
Pt guardian is at bedside; came from jury duty to check on pt. States she will not be able to come before 6 likely all week. States she is unaware of what medications pt is supposed to be taking daily and states he does not currently take his meds except for his sleeping medication after he has been up for 22-24 hours playing video games. She reports they have been attempting to find assisted living placement for him with no success. Pt has been very disappointed about this and that has made him more grumpy than usual.

## 2022-01-18 NOTE — ED Notes (Signed)
Pt returned from radiology.

## 2022-01-18 NOTE — BH Assessment (Signed)
This Clinical research associate attempted to contact pt's legal guardian [name] [phone]) for consent for treatment however there was no answer. A HIPPA compliant voicemail was left.

## 2022-01-18 NOTE — ED Notes (Signed)
Pt given dinner tray and drink at this time. 

## 2022-01-18 NOTE — Evaluation (Signed)
Physical Therapy Evaluation Patient Details Name: Derrick Atkins MRN: [id number] DOB: 06/06/1971 Today's Date: 01/18/2022  History of Present Illness  Pt is a 51 y.o. male  with history of cerebral palsy, hypertension, diabetes, schizoaffective disorder, COPD, and kidney stones who presents for evaluation of altered mental status. MD assessment includes Altered mental status unspecified and  Cellulitis of right lower extremity  Clinical Impression  Pt alert and oriented x3 and presents with a flat affect but otherwise agreeable to participate with therapy. Pt is somewhat poor historian and information of hx/PLOF gathered from pt and chart review from prior stay. Pt able to complete supine to sit w/ supervision and extra time and effort to complete. Pt able to complete sit to stands from varying level surfaces; able to do so from standard heights w/ CGA and from very low surfaces w/ minA following cuing for hand placement. Pt showed good eccentric control when coming to sit at various levels. Pt ambulated 16ft using RW w/ CGA with very slow steady gait and no LOB. Pt in more guarded position when taking a few steps using no AD when transitioning to sit. Pt will benefit from HHPT upon discharge to safely address deficits listed in patient problem list for decreased caregiver assistance and eventual return to PLOF.      Recommendations for follow up therapy are one component of a multi-disciplinary discharge planning process, led by the attending physician.  Recommendations may be updated based on patient status, additional functional criteria and insurance authorization.  Follow Up Recommendations Home health PT      Assistance Recommended at Discharge Frequent or constant Supervision/Assistance  Patient can return home with the following  A little help with walking and/or transfers;A little help with bathing/dressing/bathroom;Assistance with cooking/housework;Assist for transportation;Help with  stairs or ramp for entrance    Equipment Recommendations BSC/3in1  Recommendations for Other Services       Functional Status Assessment Patient has had a recent decline in their functional status and demonstrates the ability to make significant improvements in function in a reasonable and predictable amount of time.     Precautions / Restrictions Precautions Precautions: Fall Restrictions Weight Bearing Restrictions: No      Mobility  Bed Mobility Overal bed mobility: Needs Assistance Bed Mobility: Supine to Sit     Supine to sit: Supervision     General bed mobility comments: extra time and effort to complete    Transfers Overall transfer level: Needs assistance Equipment used: Rolling walker (2 wheels) Transfers: Sit to/from Stand Sit to Stand: Min guard           General transfer comment: able to complete sit to stands from stand height level surfaces. cuing needed for hand placement    Ambulation/Gait Ambulation/Gait assistance: Min guard Gait Distance (Feet): 50 Feet Assistive device: Rolling walker (2 wheels) Gait Pattern/deviations: Step-through pattern, Decreased step length - right, Decreased step length - left Gait velocity: decreased     General Gait Details: very slow steady gait w/ no LOB  Stairs            Wheelchair Mobility    Modified Rankin (Stroke Patients Only)       Balance Overall balance assessment: Needs assistance Sitting-balance support: Feet supported Sitting balance-Leahy Scale: Good     Standing balance support: Bilateral upper extremity supported, During functional activity Standing balance-Leahy Scale: Fair  Pertinent Vitals/Pain Pain Assessment Pain Assessment: 0-10 Pain Score:  (score not specified) Pain Location: lower back Pain Descriptors / Indicators: Aching Pain Intervention(s): Monitored during session, Repositioned    Home Living Family/patient  expects to be discharged to:: Private residence Living Arrangements: Alone Available Help at Discharge: Available PRN/intermittently;Family (brother and godmother) Type of Home: Apartment Home Access: Level entry       Home Layout: One level Home Equipment: Rollator (4 wheels);Rolling Walker (2 wheels);Grab bars - tub/shower      Prior Function Prior Level of Function : Independent/Modified Independent             Mobility Comments: Ind limited community ambulator using no AD, hx of several falls ADLs Comments: Assist w/ ADLs     Hand Dominance        Extremity/Trunk Assessment   Upper Extremity Assessment Upper Extremity Assessment: Overall WFL for tasks assessed    Lower Extremity Assessment Lower Extremity Assessment: Generalized weakness       Communication   Communication: No difficulties  Cognition Arousal/Alertness: Awake/alert Behavior During Therapy: WFL for tasks assessed/performed, Flat affect Overall Cognitive Status: No family/caregiver present to determine baseline cognitive functioning                                 General Comments: Pt somewhat poor historian, information on hx/PLOF gathered from pt and per chart review of recent hospital stay        General Comments      Exercises     Assessment/Plan    PT Assessment Patient needs continued PT services  PT Problem List Decreased strength;Decreased mobility;Decreased activity tolerance;Decreased balance;Decreased knowledge of use of DME       PT Treatment Interventions DME instruction;Therapeutic exercise;Balance training;Gait training;Stair training;Functional mobility training;Therapeutic activities;Patient/family education    PT Goals (Current goals can be found in the Care Plan section)  Acute Rehab PT Goals Patient Stated Goal: none stated PT Goal Formulation: With patient Time For Goal Achievement: 01/31/22 Potential to Achieve Goals: Good    Frequency Min  2X/week     Co-evaluation               AM-PAC PT "6 Clicks" Mobility  Outcome Measure Help needed turning from your back to your side while in a flat bed without using bedrails?: None Help needed moving from lying on your back to sitting on the side of a flat bed without using bedrails?: None Help needed moving to and from a bed to a chair (including a wheelchair)?: None Help needed standing up from a chair using your arms (e.g., wheelchair or bedside chair)?: None Help needed to walk in hospital room?: A Little Help needed climbing 3-5 steps with a railing? : A Little 6 Click Score: 22    End of Session Equipment Utilized During Treatment: Gait belt Activity Tolerance: Patient tolerated treatment well Patient left: in chair;with nursing/sitter in room Nurse Communication: Mobility status PT Visit Diagnosis: Unsteadiness on feet (R26.81);Difficulty in walking, not elsewhere classified (R26.2);Muscle weakness (generalized) (M62.81)    Time: 6720-9470 PT Time Calculation (min) (ACUTE ONLY): 14 min   Charges:              Marica Otter, SPT  01/18/2022, 5:12 PM

## 2022-01-18 NOTE — BH Assessment (Signed)
This Clinical research associate spoke with pt's legal guardian [name] [phone]) to update her on pt's plan of care.  Legal guardian informed this Clinical research associate that she is on jury duty in [employer] for the next week to week and a half and is unable to respond to contacts until after 6 PM. Legal guardian requested that text messages or voicemail be left with necessary updates.

## 2022-01-19 ENCOUNTER — Other Ambulatory Visit: Payer: Self-pay

## 2022-01-19 DIAGNOSIS — R4182 Altered mental status, unspecified: Secondary | ICD-10-CM | POA: Diagnosis not present

## 2022-01-19 LAB — CBG MONITORING, ED
Glucose-Capillary: 205 mg/dL — ABNORMAL HIGH (ref 70–99)
Glucose-Capillary: 181 mg/dL — ABNORMAL HIGH (ref 70–99)
Glucose-Capillary: 153 mg/dL — ABNORMAL HIGH (ref 70–99)
Glucose-Capillary: 147 mg/dL — ABNORMAL HIGH (ref 70–99)

## 2022-01-19 MED ORDER — METFORMIN HCL 500 MG PO TABS
1000.0000 mg | ORAL_TABLET | Freq: Two times a day (BID) | ORAL | Status: DC
  Administered 2022-01-19 – 2022-01-20 (×2): 1000 mg via ORAL
  Filled 2022-01-19 (×2): qty 2

## 2022-01-19 MED ORDER — METFORMIN HCL 500 MG PO TABS
1000.0000 mg | ORAL_TABLET | Freq: Two times a day (BID) | ORAL | Status: DC
  Administered 2022-01-19: 1000 mg via ORAL
  Filled 2022-01-19: qty 2

## 2022-01-19 MED ORDER — INSULIN ASPART 100 UNIT/ML IJ SOLN
0.0000 [IU] | Freq: Three times a day (TID) | INTRAMUSCULAR | Status: DC
  Administered 2022-01-19 – 2022-01-20 (×2): 2 [IU] via SUBCUTANEOUS
  Filled 2022-01-19 (×2): qty 1

## 2022-01-19 MED ORDER — CLINDAMYCIN HCL 150 MG PO CAPS
300.0000 mg | ORAL_CAPSULE | Freq: Three times a day (TID) | ORAL | Status: DC
  Administered 2022-01-19 – 2022-01-20 (×4): 300 mg via ORAL
  Filled 2022-01-19 (×4): qty 2

## 2022-01-19 NOTE — ED Notes (Signed)
Legal guardian, [name] here to speak with patient. Reiterated that patient would be transferred tomorrow.

## 2022-01-19 NOTE — BH Assessment (Signed)
Patient was declined by [facility] due to medical.

## 2022-01-19 NOTE — ED Notes (Signed)
Pt c/o itching to beard area, head and arms. EDP notified.

## 2022-01-19 NOTE — ED Notes (Signed)
INVOLUNTARY with all papers on chart to transfer to [facility] on 01/20/2022

## 2022-01-19 NOTE — BH Assessment (Addendum)
Referral information for Psychiatric Hospitalization faxed to:   [facility] [phone]) Per charge no, there are no appropriate beds available.  [facility] [phone]),   [facility] [phone]),  [address] [phone]),   [facility] [phone] -or- [phone]),   [facility] [phone]).  [facility] [phone])

## 2022-01-19 NOTE — ED Notes (Signed)
Hospital meal provided.  100% consumed, pt tolerated w/o complaints.  Waste discarded appropriately.   

## 2022-01-19 NOTE — ED Notes (Signed)
Patient showering at this time. Bed linens changed by this tech.

## 2022-01-19 NOTE — ED Notes (Signed)
Report received from Ally, RN including SBAR. Patient alert and oriented, warm and dry, and in no acute distress. Patient denies SI, HI, AVH and pain. Patient made aware of Q15 minute rounds and Rover and Officer presence for their safety. Patient instructed to come to this nurse with needs or concerns.  

## 2022-01-19 NOTE — Progress Notes (Signed)
Physical Therapy Treatment Patient Details Name: Derrick Atkins MRN: [id number] DOB: 02-Jan-1971 Today's Date: 01/19/2022   History of Present Illness Pt is a 51 y.o. male  with history of cerebral palsy, hypertension, diabetes, schizoaffective disorder, COPD, and kidney stones who presents for evaluation of altered mental status. MD assessment includes Altered mental status unspecified and  Cellulitis of right lower extremity    PT Comments    In bed with c/o general discomfort from immobility.  To EOB with min a x 1. Steady in sitting. Stands and walks to bathroom with RW to void and BM then continue gait around ED unit with increased speed today.  Does not use a walker at baseline but feels better with one.  Stated he does have rollator at home but it is not supportive and he does not use it.  Would like RW instead.   Recommendations for follow up therapy are one component of a multi-disciplinary discharge planning process, led by the attending physician.  Recommendations may be updated based on patient status, additional functional criteria and insurance authorization.  Follow Up Recommendations  Home health PT     Assistance Recommended at Discharge Frequent or constant Supervision/Assistance  Patient can return home with the following A little help with walking and/or transfers;A little help with bathing/dressing/bathroom;Assistance with cooking/housework;Assist for transportation;Help with stairs or ramp for entrance   Equipment Recommendations  BSC/3in1;Rolling walker (2 wheels)    Recommendations for Other Services       Precautions / Restrictions Precautions Precautions: Fall Restrictions Weight Bearing Restrictions: No     Mobility  Bed Mobility Overal bed mobility: Needs Assistance Bed Mobility: Supine to Sit     Supine to sit: Min assist     General bed mobility comments: extra time and effort to complete    Transfers Overall transfer level: Needs  assistance Equipment used: Rolling walker (2 wheels) Transfers: Sit to/from Stand Sit to Stand: Min guard                Ambulation/Gait Ambulation/Gait assistance: Min guard Gait Distance (Feet): 200 Feet Assistive device: Rolling walker (2 wheels) Gait Pattern/deviations: Step-through pattern, Decreased step length - right, Decreased step length - left       General Gait Details: good speed today   Stairs             Wheelchair Mobility    Modified Rankin (Stroke Patients Only)       Balance Overall balance assessment: Needs assistance Sitting-balance support: Feet supported Sitting balance-Leahy Scale: Good     Standing balance support: Bilateral upper extremity supported, During functional activity Standing balance-Leahy Scale: Fair Standing balance comment: trialed use of RW, with considerably improved balance with DME                            Cognition Arousal/Alertness: Awake/alert Behavior During Therapy: WFL for tasks assessed/performed, Flat affect Overall Cognitive Status: Within Functional Limits for tasks assessed                                          Exercises Other Exercises Other Exercises: to bathroom to void/bm    General Comments        Pertinent Vitals/Pain Pain Assessment Pain Assessment: Faces Faces Pain Scale: Hurts little more Pain Location: lower back Pain Descriptors / Indicators: Aching, Sore Pain  Intervention(s): Limited activity within patient's tolerance, Monitored during session, Repositioned    Home Living Family/patient expects to be discharged to:: Private residence Living Arrangements: Alone Available Help at Discharge: Available PRN/intermittently;Family ([facility]) Type of Home: Apartment Home Access: Level entry       Home Layout: One level   Additional Comments: Pt has difficulty describing his home set-up and PLOF, but is able to communicate that it is not  working well for him at present.    Prior Function            PT Goals (current goals can now be found in the care plan section) Progress towards PT goals: Progressing toward goals    Frequency    Min 2X/week      PT Plan Current plan remains appropriate    Co-evaluation              AM-PAC PT "6 Clicks" Mobility   Outcome Measure  Help needed turning from your back to your side while in a flat bed without using bedrails?: None Help needed moving from lying on your back to sitting on the side of a flat bed without using bedrails?: A Little Help needed moving to and from a bed to a chair (including a wheelchair)?: None Help needed standing up from a chair using your arms (e.g., wheelchair or bedside chair)?: None Help needed to walk in hospital room?: A Little Help needed climbing 3-5 steps with a railing? : A Little 6 Click Score: 21    End of Session Equipment Utilized During Treatment: Gait belt Activity Tolerance: Patient tolerated treatment well Patient left: in chair;with nursing/sitter in room Nurse Communication: Mobility status PT Visit Diagnosis: Unsteadiness on feet (R26.81);Difficulty in walking, not elsewhere classified (R26.2);Muscle weakness (generalized) (M62.81)     Time: 7017-7939 PT Time Calculation (min) (ACUTE ONLY): 25 min  Charges:  $Gait Training: 23-37 mins                   Danielle Dess, PTA 01/19/22, 3:35 PM

## 2022-01-19 NOTE — Evaluation (Signed)
Occupational Therapy Evaluation Patient Details Name: Derrick Atkins MRN: [id number] DOB: 02/20/1971 Today's Date: 01/19/2022   History of Present Illness Pt is a 51 y.o. male  with history of cerebral palsy, hypertension, diabetes, schizoaffective disorder, COPD, and kidney stones who presents for evaluation of altered mental status. MD assessment includes Altered mental status unspecified and  Cellulitis of right lower extremity   Clinical Impression   [name] presents with generalized weakness, limited endurance, confusion, and impaired balance. Prior to admission, he has been living alone in a small apartment, receiving PRN assistance for ADL and IADL from family, a neighbor, and a Chief Technology Officer. He does not drive, is not employed, does not use public transportation. He does not use AD and reports several recent falls. During today's evaluation, pt demonstrates impaired balance; gait and stability improved with trial RW use. Pt has difficulty engaging in conversation or describing his PLOF, health concerns, and living situation. E.g., when asked why had come to hospital, pt replies, "I was cleaning up at my house, and no one came to help me." He reports that "Derrick Atkins" from [facility] delivers medications to pt each week, but pt states that he forgets to take them. Asked how he spends his time, pt reports, "I don't have anything to do." Pt reports that he finds it difficult to get into and out of the shower at home, and it appears that he may have challenges with grooming and other basic tasks. Pt appears at this time to be at his baseline for fxl mobility, and he can not identify any particular health care need at this time. He states that his ankle "used to hurt," but reports that it feels better now. Pt could benefit from use of a RW. Post DC, HHOT could assist pt with improving ADL performance and with medication management. Ultimately pt would be safer in a group home situation  where he has regular assistance with BADL/IADL.   Recommendations for follow up therapy are one component of a multi-disciplinary discharge planning process, led by the attending physician.  Recommendations may be updated based on patient status, additional functional criteria and insurance authorization.   Follow Up Recommendations  Home health OT    Assistance Recommended at Discharge Intermittent Supervision/Assistance  Patient can return home with the following A little help with walking and/or transfers;A little help with bathing/dressing/bathroom;Assistance with cooking/housework;Direct supervision/assist for medications management    Functional Status Assessment  Patient has had a recent decline in their functional status and demonstrates the ability to make significant improvements in function in a reasonable and predictable amount of time.  Equipment Recommendations  Other (comment) (RW)    Recommendations for Other Services       Precautions / Restrictions Precautions Precautions: Fall Restrictions Weight Bearing Restrictions: No      Mobility Bed Mobility Overal bed mobility: Needs Assistance Bed Mobility: Supine to Sit     Supine to sit: Supervision     General bed mobility comments: extra time and effort to complete    Transfers Overall transfer level: Needs assistance Equipment used: Rolling walker (2 wheels) Transfers: Sit to/from Stand, Bed to chair/wheelchair/BSC Sit to Stand: Min guard     Step pivot transfers: Supervision     General transfer comment: completes transfers from variable height surfaces, with increased time and effort      Balance Overall balance assessment: Needs assistance Sitting-balance support: Feet supported Sitting balance-Leahy Scale: Good     Standing balance support: Bilateral upper  extremity supported, During functional activity Standing balance-Leahy Scale: Fair Standing balance comment: trialed use of RW, with  considerably improved balance with DME                           ADL either performed or assessed with clinical judgement   ADL Overall ADL's : Needs assistance/impaired Eating/Feeding: Modified independent Eating/Feeding Details (indicate cue type and reason): drolls or spills fluids with drinking   Grooming Details (indicate cue type and reason): Not observed; however, pt presents with unkempt, uneven facial here, indicating difficulty managing grooming tasks at home   Upper Body Bathing Details (indicate cue type and reason): Pt presents with somewhat unclean appearance and reports that he has difficulty managing showering at home   Lower Body Bathing Details (indicate cue type and reason): Pt presents with somewhat unclean appearance and reports that he has difficulty managing showering at home                             Vision         Perception     Praxis      Pertinent Vitals/Pain Pain Assessment Pain Assessment: No/denies pain     Hand Dominance     Extremity/Trunk Assessment Upper Extremity Assessment Upper Extremity Assessment: RUE deficits/detail RUE Deficits / Details: R-sided hemiplegia, since birth. Limited ROM and strength in RUE   Lower Extremity Assessment Lower Extremity Assessment: RLE deficits/detail RLE Deficits / Details: R-sided hemiplegia, since birth. Limited ROM and strength in RLE; R foot inverted       Communication Communication Communication: Expressive difficulties   Cognition Arousal/Alertness: Awake/alert Behavior During Therapy: WFL for tasks assessed/performed, Flat affect Overall Cognitive Status: History of cognitive impairments - at baseline Area of Impairment: Safety/judgement, Following commands, Awareness, Problem solving                       Following Commands: Follows one step commands inconsistently, Follows one step commands with increased time     Problem Solving: Difficulty  sequencing, Slow processing General Comments: Engages in tangential conversation, displays limited safety awareness, slow speech, increased processing time     General Comments       Exercises Other Exercises Other Exercises: Educ re: medication mgmt, importance of taking regularly; use of DME; falls prevention; routines modification; engagement in meaningful occupations   Shoulder Instructions      Home Living Family/patient expects to be discharged to:: Private residence Living Arrangements: Alone Available Help at Discharge: Available PRN/intermittently;Family ([facility]) Type of Home: Apartment Home Access: Level entry     Home Layout: One level     Bathroom Shower/Tub: Chief Strategy Officer: Standard         Additional Comments: Pt has difficulty describing his home set-up and PLOF, but is able to communicate that it is not working well for him at present.      Prior Functioning/Environment Prior Level of Function : Needs assist             Mobility Comments: IND limited community ambulator using no AD, hx of several falls ADLs Comments: Pt reports he has no daily support for ADL; does receive periodic support for IADL such as medication delivery, occasional transport        OT Problem List: Decreased strength;Decreased range of motion;Decreased activity tolerance;Decreased cognition;Decreased knowledge of use of DME or AE;Impaired  UE functional use      OT Treatment/Interventions: Self-care/ADL training;DME and/or AE instruction;Therapeutic activities;Balance training;Therapeutic exercise;Patient/family education;Cognitive remediation/compensation    OT Goals(Current goals can be found in the care plan section) Acute Rehab OT Goals Patient Stated Goal: to live somewhere else, w/ other people OT Goal Formulation: With patient Time For Goal Achievement: 02/02/22 Potential to Achieve Goals: Good ADL Goals Pt Will Perform Grooming: with  modified independence;sitting (while being able to [facility] identify his grooming and bathing needs) Pt Will Perform Upper Body Dressing: with modified independence;sitting;standing (in sitting or standing as able, and with identifying appropriate clothing) Pt Will Transfer to Toilet: with modified independence;regular height toilet;stand pivot transfer Additional ADL Goal #1: Pt will be able to identify/demonstrate 2+ falls prevention techniques Additional ADL Goal #2: Pt will be able to describe/demonstrate a strategy for regular medication mgmt  OT Frequency: Min 2X/week    Co-evaluation              AM-PAC OT "6 Clicks" Daily Activity     Outcome Measure Help from another person eating meals?: None Help from another person taking care of personal grooming?: A Little Help from another person toileting, which includes using toliet, bedpan, or urinal?: A Little Help from another person bathing (including washing, rinsing, drying)?: A Little Help from another person to put on and taking off regular upper body clothing?: A Little Help from another person to put on and taking off regular lower body clothing?: A Little 6 Click Score: 19   End of Session Equipment Utilized During Treatment: Rolling walker (2 wheels)  Activity Tolerance: Patient tolerated treatment well Patient left: in bed;with nursing/sitter in room  OT Visit Diagnosis: Unsteadiness on feet (R26.81);Repeated falls (R29.6);Muscle weakness (generalized) (M62.81);Other symptoms and signs involving cognitive function                Time: 2202-5427 OT Time Calculation (min): 31 min Charges:  OT General Charges $OT Visit: 1 Visit OT Evaluation $OT Eval Moderate Complexity: 1 Mod OT Treatments $Self Care/Home Management : 23-37 mins Latina Craver, PhD, MS, OTR/L 01/19/22, 4:47 PM

## 2022-01-19 NOTE — ED Notes (Signed)
Supervised visitation with family member and patient relations representative.

## 2022-01-19 NOTE — BH Assessment (Signed)
Writer spoke with patient's legal guardian, [name]. Writer informed guardian that patient has been accepted and will be transported on tomm 01/20/22. Writer provided address and phone number. Guardian verbalized understanding.

## 2022-01-19 NOTE — ED Notes (Signed)
Alerted EDP that pt does not have any home meds ordered and that pt is diabetic. Verbal orders received for modified diet, CBG's, and diabetes coordinator. Awaiting medication orders.

## 2022-01-19 NOTE — ED Notes (Addendum)
Pt poor historian, states that he was taking his meds at one time but then stopped taking them. He then states that [facility] brings him his medications.   Liborio Nixon with [facility] contacted at [phone] to inquire about medications. States that they are delivering them, but have found them hidden in patient apartment. Not sure how long he has been hiding them.

## 2022-01-19 NOTE — ED Provider Notes (Signed)
-----------------------------------------   8:51 AM on 01/19/2022 ----------------------------------------- Patient is complaining of some itching in the face and head after starting a new antibiotic last night.  Patient does continue to have some mild right leg erythema possibly consistent with cellulitis.  We will take the patient off of the cephalosporin and try clindamycin to see if this helps although I am not convinced that this is necessarily an allergic reaction.   Minna Antis, MD 01/19/22 [phone]

## 2022-01-19 NOTE — Inpatient Diabetes Management (Signed)
Inpatient Diabetes Program Recommendations  AACE/ADA: New Consensus Statement on Inpatient Glycemic Control   Target Ranges:  Prepandial:   less than 140 mg/dL      Peak postprandial:   less than 180 mg/dL (1-2 hours)      Critically ill patients:  140 - 180 mg/dL    Latest Reference Range & Units 01/18/22 12:21 01/19/22 10:21  Glucose-Capillary 70 - 99 mg/dL 657 (H) 846 (H)   Review of Glycemic Control  Diabetes history: DM2 Outpatient Diabetes medications: Metformin 1000 mg BID, Actos 45 mg daily, 70/30 34 units QAM, 70/30 44 units QPM Current orders for Inpatient glycemic control: none  Inpatient Diabetes Program Recommendations:    Insulin: Please consider ordering CBGS AC&HS, Novolog 0-9 units AC&HS and Metformin 1000 mg BID.   HbgA1C: A1C 7.8% on 07/24/21 in Care Everywhere.  NOTE: Noted consult for diabetes coordinator. Chart reviewed. Noted patient in ED with altered mental status, has a legal guardian, and waiting on inpatient psych placement. Per chart, patient has not been taking any of his medications and has a legal guardian. Per note on 01/18/22 by Hansel Feinstein, RN, legal guardian reported, "she is unaware of what medications pt is supposed to be taking daily and states he does not currently take his meds except for his sleeping medication after he has been up for 22-24 hours playing video games. She reports they have been attempting to find assisted living placement for him with no success."  Initial glucose 149 mg/dl on 9/62/95 and noted to be 205 mg/dl today at 28:41. Would recommend ordering Metformin, CBGs AC&HS, and Novolog 0-9 units AC&HS for correction. If glucose becomes consistently elevated, can consider adding basal insulin. Will follow along while holding in ED.  Thanks, Orlando Penner, RN, MSN, CDCES Diabetes Coordinator Inpatient Diabetes Program [phone] (Team Pager from 8am to 5pm)

## 2022-01-19 NOTE — BH Assessment (Signed)
Patient has been accepted to [facility] on tomm 01/20/22 after 8:00am.  Patient assigned to [facility] Accepting physician is Dr. Roselle Locus.  Call report to [phone].  Representative was [employer].   ER Staff is aware of it:  Misty Stanley, ER Secretary  Dr. Lenard Lance, ER MD  Connye Burkitt, Patient's Nurse     TTS left message on voicemail of Patient's Legal [name], [phone] to return call.

## 2022-01-20 DIAGNOSIS — R4182 Altered mental status, unspecified: Secondary | ICD-10-CM | POA: Diagnosis not present

## 2022-01-20 LAB — CBG MONITORING, ED
Glucose-Capillary: 118 mg/dL — ABNORMAL HIGH (ref 70–99)
Glucose-Capillary: 168 mg/dL — ABNORMAL HIGH (ref 70–99)

## 2022-01-20 MED ORDER — ONDANSETRON 4 MG PO TBDP
4.0000 mg | ORAL_TABLET | Freq: Once | ORAL | Status: AC
Start: 2022-01-20 — End: 2022-01-20
  Administered 2022-01-20: 4 mg via ORAL
  Filled 2022-01-20: qty 1

## 2022-01-20 MED ORDER — DIPHENHYDRAMINE HCL 25 MG PO CAPS
50.0000 mg | ORAL_CAPSULE | Freq: Once | ORAL | Status: AC
  Administered 2022-01-20: 50 mg via ORAL
  Filled 2022-01-20: qty 2

## 2022-01-20 NOTE — ED Notes (Signed)
Pt requests something to help with sleep, Dr. Larinda Buttery is sent secure chat at this time

## 2022-01-20 NOTE — ED Notes (Signed)
Hospital meal provided.  100% consumed, pt tolerated w/o complaints.  Waste discarded appropriately.   

## 2022-01-20 NOTE — ED Notes (Signed)
[facility]  Dept  called  for  transport to  [facility]

## 2022-01-20 NOTE — ED Notes (Signed)
Patient c/o nausea this am (recently started antibiotics and metformin). Zofran given as ordered.

## 2022-02-02 ENCOUNTER — Other Ambulatory Visit: Payer: Self-pay

## 2022-02-02 ENCOUNTER — Emergency Department (EMERGENCY_DEPARTMENT_HOSPITAL)
Admission: EM | Admit: 2022-02-02 | Discharge: 2022-02-05 | Disposition: A | Discharge status: Home / Self Care | Payer: Medicare Other | Source: Home / Self Care | Attending: Emergency Medicine | Admitting: Emergency Medicine

## 2022-02-02 ENCOUNTER — Encounter: Payer: Self-pay | Admitting: Emergency Medicine

## 2022-02-02 DIAGNOSIS — R9431 Abnormal electrocardiogram [ECG] [EKG]: Secondary | ICD-10-CM | POA: Diagnosis present

## 2022-02-02 DIAGNOSIS — I10 Essential (primary) hypertension: Secondary | ICD-10-CM | POA: Insufficient documentation

## 2022-02-02 DIAGNOSIS — E039 Hypothyroidism, unspecified: Secondary | ICD-10-CM | POA: Insufficient documentation

## 2022-02-02 DIAGNOSIS — E119 Type 2 diabetes mellitus without complications: Secondary | ICD-10-CM | POA: Insufficient documentation

## 2022-02-02 DIAGNOSIS — Z20822 Contact with and (suspected) exposure to covid-19: Secondary | ICD-10-CM | POA: Insufficient documentation

## 2022-02-02 DIAGNOSIS — F200 Paranoid schizophrenia: Secondary | ICD-10-CM | POA: Diagnosis not present

## 2022-02-02 DIAGNOSIS — E781 Pure hyperglyceridemia: Secondary | ICD-10-CM | POA: Diagnosis present

## 2022-02-02 DIAGNOSIS — F29 Unspecified psychosis not due to a substance or known physiological condition: Secondary | ICD-10-CM | POA: Insufficient documentation

## 2022-02-02 DIAGNOSIS — F209 Schizophrenia, unspecified: Secondary | ICD-10-CM | POA: Insufficient documentation

## 2022-02-02 DIAGNOSIS — D509 Iron deficiency anemia, unspecified: Secondary | ICD-10-CM | POA: Diagnosis present

## 2022-02-02 DIAGNOSIS — G8929 Other chronic pain: Secondary | ICD-10-CM | POA: Diagnosis present

## 2022-02-02 LAB — CBC
HCT: 44.5 % (ref 39.0–52.0)
Hemoglobin: 14.6 g/dL (ref 13.0–17.0)
MCH: 26.4 pg (ref 26.0–34.0)
MCHC: 32.8 g/dL (ref 30.0–36.0)
MCV: 80.5 fL (ref 80.0–100.0)
Platelets: 234 10*3/uL (ref 150–400)
RBC: 5.53 MIL/uL (ref 4.22–5.81)
RDW: 14.2 % (ref 11.5–15.5)
WBC: 9.3 10*3/uL (ref 4.0–10.5)
nRBC: 0 % (ref 0.0–0.2)

## 2022-02-02 LAB — COMPREHENSIVE METABOLIC PANEL
ALT: 25 U/L (ref 0–44)
AST: 23 U/L (ref 15–41)
Albumin: 4.7 g/dL (ref 3.5–5.0)
Alkaline Phosphatase: 88 U/L (ref 38–126)
Anion gap: 9 (ref 5–15)
BUN: 19 mg/dL (ref 6–20)
CO2: 23 mmol/L (ref 22–32)
Calcium: 9.8 mg/dL (ref 8.9–10.3)
Chloride: 108 mmol/L (ref 98–111)
Creatinine, Ser: 0.98 mg/dL (ref 0.61–1.24)
GFR, Estimated: >60 mL/min (ref >60)
Glucose, Bld: 116 mg/dL — ABNORMAL HIGH (ref 70–99)
Potassium: 3.8 mmol/L (ref 3.5–5.1)
Sodium: 140 mmol/L (ref 135–145)
Total Bilirubin: 1.1 mg/dL (ref 0.3–1.2)
Total Protein: 7.5 g/dL (ref 6.5–8.1)

## 2022-02-02 LAB — URINE DRUG SCREEN, QUALITATIVE (ARMC ONLY)
Amphetamines, Ur Screen: NOT DETECTED
Barbiturates, Ur Screen: NOT DETECTED
Benzodiazepine, Ur Scrn: NOT DETECTED
Cannabinoid 50 Ng, Ur ~~LOC~~: NOT DETECTED
Cocaine Metabolite,Ur ~~LOC~~: NOT DETECTED
MDMA (Ecstasy)Ur Screen: NOT DETECTED
Methadone Scn, Ur: NOT DETECTED
Opiate, Ur Screen: NOT DETECTED
Phencyclidine (PCP) Ur S: NOT DETECTED
Tricyclic, Ur Screen: NOT DETECTED

## 2022-02-02 LAB — ETHANOL: Alcohol, Ethyl (B): <10 mg/dL (ref <10)

## 2022-02-02 LAB — RESP PANEL BY RT-PCR (FLU A&B, COVID) ARPGX2
Influenza A by PCR: NEGATIVE
Influenza B by PCR: NEGATIVE
SARS Coronavirus 2 by RT PCR: NEGATIVE

## 2022-02-02 LAB — SALICYLATE LEVEL: Salicylate Lvl: <7 mg/dL — ABNORMAL LOW (ref 7.0–30.0)

## 2022-02-02 LAB — ACETAMINOPHEN LEVEL: Acetaminophen (Tylenol), Serum: <10 ug/mL — ABNORMAL LOW (ref 10–30)

## 2022-02-02 MED ORDER — DIVALPROEX SODIUM 500 MG PO DR TAB
500.0000 mg | DELAYED_RELEASE_TABLET | Freq: Two times a day (BID) | ORAL | Status: DC
  Administered 2022-02-03 – 2022-02-05 (×5): 500 mg via ORAL
  Filled 2022-02-02 (×6): qty 1

## 2022-02-02 MED ORDER — LEVOTHYROXINE SODIUM 88 MCG PO TABS
88.0000 ug | ORAL_TABLET | Freq: Every day | ORAL | Status: DC
  Administered 2022-02-03 – 2022-02-05 (×3): 88 ug via ORAL
  Filled 2022-02-02 (×3): qty 1

## 2022-02-02 MED ORDER — ARIPIPRAZOLE 10 MG PO TABS
10.0000 mg | ORAL_TABLET | Freq: Every day | ORAL | Status: DC
  Administered 2022-02-03 – 2022-02-05 (×3): 10 mg via ORAL
  Filled 2022-02-02 (×3): qty 1

## 2022-02-02 MED ORDER — BUPROPION HCL 75 MG PO TABS
75.0000 mg | ORAL_TABLET | Freq: Two times a day (BID) | ORAL | Status: DC
  Administered 2022-02-03 – 2022-02-05 (×5): 75 mg via ORAL
  Filled 2022-02-02 (×6): qty 1

## 2022-02-02 MED ORDER — OLANZAPINE 10 MG IM SOLR
5.0000 mg | Freq: Once | INTRAMUSCULAR | Status: DC
  Filled 2022-02-02: qty 10

## 2022-02-02 MED ORDER — LORAZEPAM 2 MG PO TABS: 2.0000 mg | ORAL_TABLET | Freq: Once | ORAL | Status: AC

## 2022-02-02 MED ORDER — LISINOPRIL 5 MG PO TABS
10.0000 mg | ORAL_TABLET | Freq: Every day | ORAL | Status: DC
  Administered 2022-02-02 – 2022-02-05 (×4): 10 mg via ORAL
  Filled 2022-02-02: qty 1
  Filled 2022-02-02 (×3): qty 2

## 2022-02-02 MED ORDER — GEMFIBROZIL 600 MG PO TABS
600.0000 mg | ORAL_TABLET | Freq: Two times a day (BID) | ORAL | Status: DC
  Administered 2022-02-03 – 2022-02-04 (×3): 600 mg via ORAL
  Filled 2022-02-02 (×4): qty 1

## 2022-02-02 MED ORDER — ASPIRIN 81 MG PO TBEC
81.0000 mg | DELAYED_RELEASE_TABLET | Freq: Every day | ORAL | Status: DC
  Administered 2022-02-02 – 2022-02-05 (×4): 81 mg via ORAL
  Filled 2022-02-02 (×4): qty 1

## 2022-02-02 MED ORDER — METFORMIN HCL 500 MG PO TABS
1000.0000 mg | ORAL_TABLET | Freq: Two times a day (BID) | ORAL | Status: DC
Start: 2022-02-03 — End: 2022-02-05
  Administered 2022-02-03 – 2022-02-05 (×5): 1000 mg via ORAL
  Filled 2022-02-02 (×5): qty 2

## 2022-02-02 MED ORDER — SIMVASTATIN 20 MG PO TABS
20.0000 mg | ORAL_TABLET | Freq: Every day | ORAL | Status: DC
  Administered 2022-02-03 – 2022-02-04 (×2): 20 mg via ORAL
  Filled 2022-02-02 (×3): qty 1

## 2022-02-02 MED ORDER — PIOGLITAZONE HCL 30 MG PO TABS
45.0000 mg | ORAL_TABLET | Freq: Every day | ORAL | Status: DC
  Administered 2022-02-03 – 2022-02-05 (×3): 45 mg via ORAL
  Filled 2022-02-02 (×3): qty 1

## 2022-02-02 MED ORDER — LORAZEPAM 2 MG/ML IJ SOLN
2.0000 mg | Freq: Once | INTRAMUSCULAR | Status: AC
  Administered 2022-02-02: 2 mg via INTRAMUSCULAR
  Filled 2022-02-02: qty 1

## 2022-02-02 NOTE — ED Notes (Signed)
Awaiting security arrival for safe medication administration

## 2022-02-02 NOTE — ED Notes (Signed)
Pt has been sitting on floor in corner of room since assessment was comleted.

## 2022-02-02 NOTE — ED Triage Notes (Signed)
Patient to ED via police under IVC. Patient psychiatrist per IVC paperwork notes that patient was dc'd from inpatient psych on 01/29/22 and has been decompensating since. Per note patient appears "disheveled with weight loss, paranoid, and hyper-religous." Patient calm but flat affect for triage.

## 2022-02-02 NOTE — BH Assessment (Signed)
Comprehensive Clinical Assessment (CCA) Note  02/02/2022 Derrick Atkins [id number]  Chief Complaint: Patient is a 51 year old male presenting to [facility] ED under IVC. Per triage note Patient to ED via police under IVC. Patient psychiatrist per IVC paperwork notes that patient was dc'd from inpatient psych on 01/29/22 and has been decompensating since. Per note patient appears "disheveled with weight loss, paranoid, and hyper-religous." Patient calm but flat affect for triage. During assessment patient appears alert and oriented x2, cooperative but anxious, paranoid and guarded, patient can be seen sitting in the corner of his room on the floor. Patient reports "I just came back from [place], I'm sure everyone is going to attack me." Patient is anxious during the assessment with his hands covering his face, patient's thoughts are disorganized and not clear but patient is able to be redirected. Unclear if patient is experiencing SI/HI  Per Psyc NP Elenore Paddy patient is recommended for Inpatient Chief Complaint  Patient presents with   Psychiatric Evaluation        Visit Diagnosis: Schizophrenia, paranoid    CCA Screening, Triage and Referral (STR)  Patient Reported Information How did you hear about [place]? Legal System  Referral name: No data recorded Referral phone number: No data recorded  Whom do you see for routine medical problems? No data recorded Practice/Facility Name: No data recorded Practice/Facility Phone Number: No data recorded Name of Contact: No data recorded Contact Number: No data recorded Contact Fax Number: No data recorded Prescriber Name: No data recorded Prescriber Address (if known): No data recorded  What Is the Reason for Your Visit/Call Today? Patient presents under IVC via patient's outpatient psychiartrist who reports that the patient is decompensating  How Long Has This Been Causing You Problems? > than 6 months  What Do You Feel Would Help You the  Most Today? Medication(s)   Have You Recently Been in Any Inpatient Treatment (Hospital/Detox/Crisis Center/28-Day Program)? No data recorded Name/Location of Program/Hospital:No data recorded How Long Were You There? No data recorded When Were You Discharged? No data recorded  Have You Ever Received Services From [facility] Before? No data recorded Who Do You See at [facility]? No data recorded  Have You Recently Had Any Thoughts About Hurting Yourself? -- (UTA)  Are You Planning to Commit Suicide/Harm Yourself At This time? -- (UTA)   Have you Recently Had Thoughts About Hurting Someone Else? -- (UTA)  Explanation: No data recorded  Have You Used Any Alcohol or Drugs in the Past 24 Hours? -- (UTA)  How Long Ago Did You Use Drugs or Alcohol? No data recorded What Did You Use and How Much? No data recorded  Do You Currently Have a Therapist/Psychiatrist? Yes  Name of Therapist/Psychiatrist: Unknown   Have You Been Recently Discharged From Any Office Practice or Programs? No  Explanation of Discharge From Practice/Program: No data recorded    CCA Screening Triage Referral Assessment Type of Contact: Face-to-Face  Is this Initial or Reassessment? No data recorded Date Telepsych consult ordered in [facility]:  No data recorded Time Telepsych consult ordered in [facility]:  No data recorded  Patient Reported Information Reviewed? No data recorded Patient Left Without Being Seen? No data recorded Reason for Not Completing Assessment: No data recorded  Collateral Involvement: Hamilton Wenzlick- brother [phone]   Does Patient Have a Court Appointed Legal Guardian? No data recorded Name and Contact of Legal Guardian: No data recorded If Minor and Not Living with Parent(s), Who has Custody? No  data recorded Is CPS involved or ever been involved? Never  Is APS involved or ever been involved? Never   Patient Determined To Be At Risk for Harm To Self or Others Based on Review of  Patient Reported Information or Presenting Complaint? No  Method: No data recorded Availability of Means: No data recorded Intent: No data recorded Notification Required: No data recorded Additional Information for Danger to Others Potential: No data recorded Additional Comments for Danger to Others Potential: No data recorded Are There Guns or Other Weapons in Your Home? No data recorded Types of Guns/Weapons: No data recorded Are These Weapons Safely Secured?                            No data recorded Who Could Verify You Are Able To Have These Secured: No data recorded Do You Have any Outstanding Charges, Pending Court Dates, Parole/Probation? No data recorded Contacted To Inform of Risk of Harm To Self or Others: No data recorded  Location of Assessment: [facility] ED   Does Patient Present under Involuntary Commitment? Yes  IVC Papers Initial File Date: 02/02/22   [place] of Residence: [place]   Patient Currently Receiving the Following Services: Medication Management   Determination of Need: Emergent (2 hours)   Options For Referral: ED Visit; Inpatient Hospitalization; Medication Management; Group Home     CCA Biopsychosocial Intake/Chief Complaint:  No data recorded Current Symptoms/Problems: No data recorded  Patient Reported Schizophrenia/Schizoaffective Diagnosis in Past: Yes   Strengths: Patient able to communicate and verbalize needs.  Preferences: No data recorded Abilities: No data recorded  Type of Services Patient Feels are Needed: No data recorded  Initial Clinical Notes/Concerns: No data recorded  Mental Health Symptoms Depression:   Difficulty Concentrating; Irritability; Weight gain/loss   Duration of Depressive symptoms:  Greater than two weeks   Mania:   Racing thoughts; Irritability   Anxiety:    Difficulty concentrating; Sleep; Irritability; Restlessness; Worrying   Psychosis:   Delusions   Duration of Psychotic symptoms:   Greater than six months   Trauma:   None   Obsessions:   Cause anxiety; Poor insight; Recurrent & persistent thoughts/impulses/images   Compulsions:   Poor Insight   Inattention:   Disorganized   Hyperactivity/Impulsivity:   None   Oppositional/Defiant Behaviors:   None   Emotional Irregularity:   None   Other Mood/Personality Symptoms:  No data recorded   Mental Status Exam Appearance and self-care  Stature:   Average   Weight:   Average weight   Clothing:   Disheveled   Grooming:   Neglected   Cosmetic use:   None   Posture/gait:   Tense; Rigid   Motor activity:   Not Remarkable   Sensorium  Attention:   Unaware   Concentration:   Focuses on irrelevancies   Orientation:   Person; Place   Recall/memory:   Normal   Affect and Mood  Affect:   Anxious   Mood:   Anxious   Relating  Eye contact:   Fleeting   Facial expression:   Tense; Fearful   Attitude toward examiner:   Cooperative; Defensive; Guarded   Thought and Language  Speech flow:  Articulation error   Thought content:   Delusions; Persecutions   Preoccupation:   Ruminations; Suicide   Hallucinations:   None   Organization:  No data recorded  [employer] of Knowledge:   Average   Intelligence:  Average   Abstraction:   Functional   Judgement:   Impaired   Reality Testing:   Distorted   Insight:   Lacking   Decision Making:   Paralyzed   Social Functioning  Social Maturity:   Isolates   Social Judgement:   Heedless   Stress  Stressors:   Illness; Housing   Coping Ability:   Exhausted   Skill Deficits:   Decision making   Supports:   Family; Friends/Service system     Religion: Religion/Spirituality Are You A Religious Person?: No  Leisure/Recreation: Leisure / Recreation Do You Have Hobbies?: No  Exercise/Diet: Exercise/Diet Do You Exercise?: No Have You Gained or Lost A Significant Amount of Weight  in the Past Six Months?: No Do You Follow a Special Diet?: No Do You Have Any Trouble Sleeping?: Yes Explanation of Sleeping Difficulties: Patient reports poor sleep habits.   CCA Employment/Education Employment/Work Situation: Employment / Work Technical sales engineer: On disability Why is Patient on Disability: Mental Health How Long has Patient Been on Disability: Unknown Has Patient ever Been in the [employer]?: No  Education: Education Is Patient Currently Attending School?: No Did You Have An Individualized Education Program (IIEP): No Did You Have Any Difficulty At School?: No Patient's Education Has Been Impacted by Current Illness: No   CCA Family/Childhood History Family and Relationship History: Family history Marital status: Single Does patient have children?: No  Childhood History:  Childhood History Did patient suffer any verbal/emotional/physical/sexual abuse as a child?: No Did patient suffer from severe childhood neglect?: No Has patient ever been sexually abused/assaulted/raped as an adolescent or adult?: No Was the patient ever a victim of a crime or a disaster?: No Witnessed domestic violence?: No Has patient been affected by domestic violence as an adult?: No  Child/Adolescent Assessment:     CCA Substance Use Alcohol/Drug Use: Alcohol / Drug Use Pain Medications: See PTA Prescriptions: See PTA Over the Counter: See PTA History of alcohol / drug use?: No history of alcohol / drug abuse                         ASAM's:  Six Dimensions of Multidimensional Assessment  Dimension 1:  Acute Intoxication and/or Withdrawal Potential:      Dimension 2:  Biomedical Conditions and Complications:      Dimension 3:  Emotional, Behavioral, or Cognitive Conditions and Complications:     Dimension 4:  Readiness to Change:     Dimension 5:  Relapse, Continued use, or Continued Problem Potential:     Dimension 6:  Recovery/Living  Environment:     ASAM Severity Score:    ASAM Recommended Level of Treatment:     Substance use Disorder (SUD)    Recommendations for Services/Supports/Treatments:    DSM5 Diagnoses: Patient Active Problem List   Diagnosis Date Noted   Small bowel obstruction ([place]) 05/13/2020   Chronic low back pain without sciatica 12/09/2017   History of scoliosis 12/09/2017   Schizophrenia, paranoid, chronic ([place])    Long QT interval 04/16/2014   Hypertriglyceridemia 04/02/2014   Long-term insulin use ([place]) 04/02/2014   Iron deficiency anemia, unspecified 12/31/2013   Hypothyroidism 12/25/2013   DIABETES MELLITUS 05/03/2007   Schizophrenia ([place]) 05/03/2007   HYPERTENSION 05/03/2007   PNEUMONITIS DUE TO FOOD/VOMIT INHALATION 05/03/2007   PECTUS EXCAVATUM 05/03/2007   Type 2 diabetes mellitus without complications ([place]) [id number]    Patient Centered Plan: Patient is on the following Treatment Plan(s):  Impulse  Control   Referrals to Alternative Service(s): Referred to Alternative Service(s):   Place:   Date:   Time:    Referred to Alternative Service(s):   Place:   Date:   Time:    Referred to Alternative Service(s):   Place:   Date:   Time:    Referred to Alternative Service(s):   Place:   Date:   Time:      @BHCOLLABOFCARE @  , LCAS-A

## 2022-02-02 NOTE — ED Notes (Signed)
Pt refuses medications after agreeing to take them. Pt states that he is not going to be a part of the plan and that he was told this would happen in his poem. Pt angry and waving arms, refusing to take anything due to the plan he heard. Attempts to reorient pt and redirect but failed. Pt remains in room sitting on side of bed. As nurse is exiting he states that he was just in [place] and that he is taking no medications because he had alll he needed there and the plan sent him to this spot

## 2022-02-02 NOTE — BH Assessment (Signed)
Referral information for Psychiatric Hospitalization faxed to;   [facility] ([phone]),   [facility] [phone]),  [address] [phone]),   [facility] [phone] -or- [phone]),   [facility] [phone])  [place] [phone])

## 2022-02-02 NOTE — ED Notes (Signed)
Snack and drink given 

## 2022-02-02 NOTE — ED Provider Notes (Signed)
Patient seen by psychiatry and they recommend inpatient treatment.   Georga Hacking, MD 02/02/22 [id number]

## 2022-02-02 NOTE — ED Notes (Signed)
Behavior has been unchanged but he has not hit the wall as much

## 2022-02-02 NOTE — ED Notes (Signed)
Patient transferred from ED to BHU room 7 after screening for contraband. Report received from Kim, RN including Situation, Background, Assessment and Recommendations. Pt oriented to unit including Q15 minute rounds as well as the security cameras for their protection. Patient is alert and oriented, warm and dry in no acute distress. Patient denies SI, HI, and AVH. Pt. Encouraged to let this nurse know if needs arise.  

## 2022-02-02 NOTE — ED Notes (Addendum)
Pt took medications begrudgingly. Did not require a force med hold. Security was around pt, pt took I'm injection while security ensured pt did not lash out.

## 2022-02-02 NOTE — ED Notes (Signed)
Pt dressed into wine colored scrubs. Pt items placed in belongings bags are the following; Black sneakers Blue jeans  Gray underwear [employer] Green lanyard Teal socks [employer] wallet  Pt seemingly tearful at times but cooperative while getting dressed into hospital scrubs.

## 2022-02-02 NOTE — ED Notes (Signed)
Continue to await security arrival. Pt remains in same state, standing in corner now. Annice Pih NP states to admin IM ativan first for behavior and if continues after an hour to give Zyprexa

## 2022-02-02 NOTE — ED Provider Notes (Signed)
[facility] Provider Note    Event Date/Time   First MD Initiated Contact with Patient 02/02/22 1901     (approximate)   History   Psychiatric Evaluation (/)   HPI  Derrick Atkins is a 51 y.o. male with past medical history of cerebral palsy, schizoaffective disorder, hypertension diabetes presents under IVC.  Patient was discharged on 721 from inpatient psych but his psychiatrist was concerned that he has been decompensating since not eating has had weight loss paranoia and is hyperreligious.  Patient tells me that he does not know why he is here he just got out of the hospital and he feels like he has been doing well denies suicidal ideation or hallucinations does say he does not eat very much cannot tell me why.  He lives alone currently.  Says he has been trying to take his medications as prescribed and has a bubble pack for it.  No other complaints currently.     Past Medical History:  Diagnosis Date   Cerebral palsy (HCC)    Depression    Diabetes mellitus without complication (HCC)    Hypertension    Kidney stones    Schizoaffective disorder [facility])    Scoliosis     Patient Active Problem List   Diagnosis Date Noted   Small bowel obstruction (HCC) 05/13/2020   Chronic low back pain without sciatica 12/09/2017   History of scoliosis 12/09/2017   Schizophrenia, paranoid, chronic (HCC)    Long QT interval 04/16/2014   Hypertriglyceridemia 04/02/2014   Long-term insulin use (HCC) 04/02/2014   Iron deficiency anemia, unspecified 12/31/2013   Hypothyroidism 12/25/2013   DIABETES MELLITUS 05/03/2007   Schizophrenia (HCC) 05/03/2007   HYPERTENSION 05/03/2007   PNEUMONITIS DUE TO FOOD/VOMIT INHALATION 05/03/2007   PECTUS EXCAVATUM 05/03/2007   Type 2 diabetes mellitus without complications (HCC) 05/03/2007     Physical Exam  Triage Vital Signs: ED Triage Vitals  Enc Vitals Group     BP 02/02/22 1741 (!) 165/101     Pulse Rate 02/02/22  1741 (!) 58     Resp 02/02/22 1741 18     Temp 02/02/22 1741 98.5 F (36.9 C)     Temp Source 02/02/22 1741 Oral     SpO2 02/02/22 1743 97 %     Weight --      Height 02/02/22 1742 6\' 4"  (1.93 m)     Head Circumference --      Peak Flow --      Pain Score 02/02/22 1742 0     Pain Loc --      Pain Edu? --      Excl. in GC? --     Most recent vital signs: Vitals:   02/02/22 1741 02/02/22 1743  BP: (!) 165/101   Pulse: (!) 58   Resp: 18   Temp: 98.5 F (36.9 C)   SpO2:  97%     General: Awake, no distress.  CV:  Good peripheral perfusion.  Resp:  Normal effort.  Abd:  No distention.  Neuro:             Awake, Alert, Oriented x 3  Other:  Patient is somewhat frustrated, stutters intermittently, no suicidal ideation he is redirectable   ED Results / Procedures / Treatments  Labs (all labs ordered are listed, but only abnormal results are displayed) Labs Reviewed  COMPREHENSIVE METABOLIC PANEL - Abnormal; Notable for the following components:      Result Value  Glucose, Bld 116 (*)    All other components within normal limits  SALICYLATE LEVEL - Abnormal; Notable for the following components:   Salicylate Lvl <7.0 (*)    All other components within normal limits  ACETAMINOPHEN LEVEL - Abnormal; Notable for the following components:   Acetaminophen (Tylenol), Serum <10 (*)    All other components within normal limits  RESP PANEL BY RT-PCR (FLU A&B, COVID) ARPGX2  ETHANOL  CBC  URINE DRUG SCREEN, QUALITATIVE ([facility] ONLY)     EKG     RADIOLOGY    PROCEDURES:  Critical Care performed: No  Procedures   MEDICATIONS ORDERED IN ED: Medications  ARIPiprazole (ABILIFY) tablet 10 mg (has no administration in time range)  aspirin EC tablet 81 mg (has no administration in time range)  buPROPion (WELLBUTRIN) tablet 75 mg (has no administration in time range)  divalproex (DEPAKOTE) DR tablet 500 mg (has no administration in time range)  gemfibrozil (LOPID)  tablet 600 mg (has no administration in time range)  levothyroxine (SYNTHROID) tablet 88 mcg (has no administration in time range)  lisinopril (ZESTRIL) tablet 10 mg (has no administration in time range)  metFORMIN (GLUCOPHAGE) tablet 1,000 mg (has no administration in time range)  pioglitazone (ACTOS) tablet 45 mg (has no administration in time range)  simvastatin (ZOCOR) tablet 20 mg (has no administration in time range)     IMPRESSION / MDM / ASSESSMENT AND PLAN / ED COURSE  I reviewed the triage vital signs and the nursing notes.                              Patient's presentation is most consistent with acute presentation with potential threat to life or bodily function.  Differential diagnosis includes, but is not limited to, decompensated psychosis, medication noncompliance, drug-induced mood disorder  The patient is a 51 year old male with history of schizoaffective disorder and cerebral palsy presents under IVC that was completed by his psychiatrist who is concerned that he has been decompensating since recent discharge from inpatient psych just 4 days ago.  Per the notes he seems disheveled has had weight loss paranoid and hyperreligious.  Patient is somewhat frustrated that he is here says he just got out of the hospital says has been doing well taking his medications and denies suicidality.  Does admit to not eating very much cannot really explain to me why.  He is mildly hypertensive and bradycardic but vitals otherwise within normal limits.  Tylenol and salicylate levels are negative as is the ethanol.  CMP and CBC largely within normal limits Accu-Chek 116.  Given patient psychiatrist concern will consult psychiatry and TTS.  I have ordered his home meds.  He remains under IVC.  The patient has been placed in psychiatric observation due to the need to provide a safe environment for the patient while obtaining psychiatric consultation and evaluation, as well as ongoing medical and  medication management to treat the patient's condition.  The patient has been placed under full IVC at this time.        FINAL CLINICAL IMPRESSION(S) / ED DIAGNOSES   Final diagnoses:  Psychosis, unspecified psychosis type (HCC)     Rx / DC Orders   ED Discharge Orders     None        Note:  This document was prepared using Dragon voice recognition software and may include unintentional dictation errors.   Georga Hacking, MD 02/02/22 [phone]

## 2022-02-02 NOTE — ED Notes (Signed)
Pt is experiencing self destructive behaviors, hitting arms and head on wall while sitting on the floor in the corner. Pt yelling, disrupting unit, word salad, inability to collect thoughts, agitated, anxiety, confusion. Pt not redirectable. Consulting with Annice Pih NP and she states she will order IM

## 2022-02-03 DIAGNOSIS — F200 Paranoid schizophrenia: Secondary | ICD-10-CM | POA: Diagnosis not present

## 2022-02-03 MED ORDER — ZIPRASIDONE MESYLATE 20 MG IM SOLR
20.0000 mg | Freq: Once | INTRAMUSCULAR | Status: AC
  Administered 2022-02-03: 20 mg via INTRAMUSCULAR

## 2022-02-03 NOTE — ED Notes (Signed)
Pt has laid down at this time 

## 2022-02-03 NOTE — ED Notes (Signed)
IVC/  PENDING  PLACEMENT 

## 2022-02-03 NOTE — ED Notes (Signed)
IVC, inpatient psych

## 2022-02-03 NOTE — ED Notes (Signed)
IVC pending placement  inpatient psych

## 2022-02-03 NOTE — BH Assessment (Addendum)
Referral information for Psychiatric Hospitalization re-faxed to;    [facility] ([phone]- [phone]),    [facility] [phone]), Facility reports currently at capacity   [facility] [phone]), Staff reports referral not received, referral re-faxed at 2:25am on 02/04/22. Contacted staff back and patient denied due to "not meeting criteria."    [facility] [phone] -or- [phone]), Denied due to medical issues and recent hospitalization    [facility] [phone]) Patient hasn't been reviewed yet   [facility] [phone]) Patient declined due to "not able to accommodate his needs"

## 2022-02-03 NOTE — ED Notes (Signed)
Patient became upset and was hitting arm against wall, nurse went and talked with him and He became calmer, He is redirectable, nurse ask him to come to the dayroom and talk with group about any issues if He wanted to and He said " ok where is it " Nurse showed him the dayroom again, and He sit in chair and did attempt to communicate with nurse and others . Patient's speech is impaired, ( stutters, and cannot complete sentences, has word salad.

## 2022-02-03 NOTE — ED Notes (Signed)
Pt apprehensive on taking IM injection, this nurse and security communicate with pt and he agrees to take injection. Administered by this nurse. Provided with drink after

## 2022-02-03 NOTE — ED Notes (Signed)
Pt is awake, experiencing severe auditory hallucinations that are causing him to respond and yell. Pt pacing, hitting the walls, floors, and is inconsolable. Defensive and guarded toward staff.

## 2022-02-03 NOTE — Consult Note (Signed)
[facility] Face-to-Face Psychiatry Consult   Reason for Consult:Psychiatric Evaluation (/) Referring Physician: Dr. Sidney Ace Patient Identification: Derrick Atkins MRN:  [id number] Principal Diagnosis: <principal problem not specified> Diagnosis:  Active Problems:   Schizophrenia (HCC)   Schizophrenia, paranoid, chronic (HCC)   Chronic low back pain without sciatica   Hypertriglyceridemia   Hypothyroidism   Iron deficiency anemia, unspecified   Long QT interval   Type 2 diabetes mellitus without complications (HCC)   Total Time spent with patient: 1 hour  Subjective: "They are trying to kill me." Derrick Atkins is a 51 y.o. male patient presented to [facility] ED via law enforcement under involuntary commitment status (IVC). It was reported that the patient was discharged from an inpatient facility on 01/29/22 and decompensating since his discharge. Per the report, the patient was disheveled with weight loss, paranoid, and hyper-religious. The patient was seen sitting in the corner of his room while being assessed. He was in a ball, yelling, "they are coming to kill me." "I am fighting a war; they want to get me." The patient was encouraged to breathe and calm down. He listened for short periods, and he would begin yelling that people were after him.  This provider saw The patient face-to-face; the chart was reviewed, and consulted with Dr.McHugh on 02/02/2022 due to the patient's care. It was discussed with the EDP that the patient does meet the criteria to be admitted to the psychiatric inpatient unit.  On evaluation, the patient is alert and oriented x 1-2, anxious, and mood-congruent with affect. The patient does appear to be responding to internal and external stimuli. The patient is presenting with delusional thinking. The patient is experiencing auditory and visual hallucinations. The patient is presenting with psychotic and paranoid behaviors.   HPI: Per Dr. Sidney Ace, Derrick Atkins is a 51 y.o.  male with past medical history of cerebral palsy, schizoaffective disorder, hypertension diabetes presents under IVC.  Patient was discharged on 721 from inpatient psych but his psychiatrist was concerned that he has been decompensating since not eating has had weight loss paranoia and is hyperreligious.  Patient tells me that he does not know why he is here he just got out of the hospital and he feels like he has been doing well denies suicidal ideation or hallucinations does say he does not eat very much cannot tell me why.  He lives alone currently.  Says he has been trying to take his medications as prescribed and has a bubble pack for it.  No other complaints currently.  Past Psychiatric History:  Schizoaffective disorder (HCC) Depression  Risk to Self:    Risk to Others:   Prior Inpatient Therapy:   Prior Outpatient Therapy:    Past Medical History:  Past Medical History:  Diagnosis Date   Cerebral palsy (HCC)    Depression    Diabetes mellitus without complication (HCC)    Hypertension    Kidney stones    Schizoaffective disorder (HCC)    Scoliosis     Past Surgical History:  Procedure Laterality Date   BOWEL RESECTION     LAPAROTOMY N/A 05/15/2020   Procedure: EXPLORATORY LAPAROTOMY;  Surgeon: Henrene Dodge, MD;  Location: [facility] ORS;  Service: General;  Laterality: N/A;   Family History:  Family History  Problem Relation Age of Onset   Heart disease Father    Heart attack Father    Family Psychiatric  History:  Social History:  Social History   Substance and Sexual Activity  Alcohol Use No     Social History   Substance and Sexual Activity  Drug Use No    Social History   Socioeconomic History   Marital status: Single    Spouse name: Not on file   Number of children: Not on file   Years of education: Not on file   Highest education level: Not on file  Occupational History   Not on file  Tobacco Use   Smoking status: Never   Smokeless tobacco: Never   Vaping Use   Vaping Use: Never used  Substance and Sexual Activity   Alcohol use: No   Drug use: No   Sexual activity: Not on file  Other Topics Concern   Not on file  Social History Narrative   Not on file   Social Determinants of Health   Financial Resource Strain: Not on file  Food Insecurity: Not on file  Transportation Needs: Not on file  Physical Activity: Not on file  Stress: Not on file  Social Connections: Not on file   Additional Social History:    Allergies:   Allergies  Allergen Reactions   Phenytoin Sodium Extended Other (See Comments) and Nausea And Vomiting   Prednisone Other (See Comments)   Latex Hives, Nausea And Vomiting and Rash    Labs:  Results for orders placed or performed during the hospital encounter of 02/02/22 (from the past 48 hour(s))  Comprehensive metabolic panel     Status: Abnormal   Collection Time: 02/02/22  5:45 PM  Result Value Ref Range   Sodium 140 135 - 145 mmol/L   Potassium 3.8 3.5 - 5.1 mmol/L   Chloride 108 98 - 111 mmol/L   CO2 23 22 - 32 mmol/L   Glucose, Bld 116 (H) 70 - 99 mg/dL    Comment: Glucose reference range applies only to samples taken after fasting for at least 8 hours.   BUN 19 6 - 20 mg/dL   Creatinine, Ser 1.94 0.61 - 1.24 mg/dL   Calcium 9.8 8.9 - 17.4 mg/dL   Total Protein 7.5 6.5 - 8.1 g/dL   Albumin 4.7 3.5 - 5.0 g/dL   AST 23 15 - 41 U/L   ALT 25 0 - 44 U/L   Alkaline Phosphatase 88 38 - 126 U/L   Total Bilirubin 1.1 0.3 - 1.2 mg/dL   GFR, Estimated >08 >14 mL/min    Comment: (NOTE) Calculated using the CKD-EPI Creatinine Equation (2021)    Anion gap 9 5 - 15    Comment: Performed at [facility], [address] [place], [place] [address]  Ethanol     Status: None   Collection Time: 02/02/22  5:45 PM  Result Value Ref Range   Alcohol, Ethyl (B) <10 <10 mg/dL    Comment: (NOTE) Lowest detectable limit for serum alcohol is 10 mg/dL.  For medical purposes only. Performed at  [facility], [address] [place], [place] [address]   Salicylate level     Status: Abnormal   Collection Time: 02/02/22  5:45 PM  Result Value Ref Range   Salicylate Lvl <7.0 (L) 7.0 - 30.0 mg/dL    Comment: Performed at [facility], [address] [place], [place] [address]  Acetaminophen level     Status: Abnormal   Collection Time: 02/02/22  5:45 PM  Result Value Ref Range   Acetaminophen (Tylenol), Serum <10 (L) 10 - 30 ug/mL    Comment: (NOTE) Therapeutic concentrations vary significantly. A  range of 10-30 ug/mL  may be an effective concentration for many patients. However, some  are best treated at concentrations outside of this range. Acetaminophen concentrations >150 ug/mL at 4 hours after ingestion  and >50 ug/mL at 12 hours after ingestion are often associated with  toxic reactions.  Performed at [facility], [address] [place], [place] [address]   cbc     Status: None   Collection Time: 02/02/22  5:45 PM  Result Value Ref Range   WBC 9.3 4.0 - 10.5 K/uL   RBC 5.53 4.22 - 5.81 MIL/uL   Hemoglobin 14.6 13.0 - 17.0 g/dL   HCT 60.444.5 54.039.0 - 98.152.0 %   MCV 80.5 80.0 - 100.0 fL   MCH 26.4 26.0 - 34.0 pg   MCHC 32.8 30.0 - 36.0 g/dL   RDW 19.114.2 47.811.5 - 29.515.5 %   Platelets 234 150 - 400 K/uL   nRBC 0.0 0.0 - 0.2 %    Comment: Performed at [facility], [address] [place], [place] [address]  Urine Drug Screen, Qualitative     Status: None   Collection Time: 02/02/22  5:45 PM  Result Value Ref Range   Tricyclic, Ur Screen NONE DETECTED NONE DETECTED   Amphetamines, Ur Screen NONE DETECTED NONE DETECTED   MDMA (Ecstasy)Ur Screen NONE DETECTED NONE DETECTED   Cocaine Metabolite,Ur [address] NONE DETECTED NONE DETECTED   Opiate, Ur Screen NONE DETECTED NONE DETECTED   Phencyclidine (PCP) Ur S NONE DETECTED NONE DETECTED   Cannabinoid 50 Ng, Ur  NONE DETECTED NONE DETECTED   Barbiturates, Ur Screen NONE DETECTED NONE DETECTED    Benzodiazepine, Ur Scrn NONE DETECTED NONE DETECTED   Methadone Scn, Ur NONE DETECTED NONE DETECTED    Comment: (NOTE) Tricyclics + metabolites, urine    Cutoff 1000 ng/mL Amphetamines + metabolites, urine  Cutoff 1000 ng/mL MDMA (Ecstasy), urine              Cutoff 500 ng/mL Cocaine Metabolite, urine          Cutoff 300 ng/mL Opiate + metabolites, urine        Cutoff 300 ng/mL Phencyclidine (PCP), urine         Cutoff 25 ng/mL Cannabinoid, urine                 Cutoff 50 ng/mL Barbiturates + metabolites, urine  Cutoff 200 ng/mL Benzodiazepine, urine              Cutoff 200 ng/mL Methadone, urine                   Cutoff 300 ng/mL  The urine drug screen provides only a preliminary, unconfirmed analytical test result and should not be used for non-medical purposes. Clinical consideration and professional judgment should be applied to any positive drug screen result due to possible interfering substances. A more specific alternate chemical method must be used in order to obtain a confirmed analytical result. Gas chromatography / mass spectrometry (GC/MS) is the preferred confirm atory method. Performed at [facility], [address] [place], [place] [address]   Resp Panel by RT-PCR (Flu A&B, Covid) Anterior Nasal Swab     Status: None   Collection Time: 02/02/22  7:29 PM   Specimen: Anterior Nasal Swab  Result Value Ref Range   SARS Coronavirus 2 by RT PCR NEGATIVE NEGATIVE    Comment: (NOTE) SARS-CoV-2 target nucleic acids are NOT DETECTED.  The SARS-CoV-2 RNA is generally  detectable in upper respiratory specimens during the acute phase of infection. The lowest concentration of SARS-CoV-2 viral copies this assay can detect is 138 copies/mL. A negative result does not preclude SARS-Cov-2 infection and should not be used as the sole basis for treatment or other patient management decisions. A negative result may occur with  improper specimen collection/handling,  submission of specimen other than nasopharyngeal swab, presence of viral mutation(s) within the areas targeted by this assay, and inadequate number of viral copies(<138 copies/mL). A negative result must be combined with clinical observations, patient history, and epidemiological information. The expected result is Negative.  Fact Sheet for Patients:  BloggerCourse.com  Fact Sheet for Healthcare Providers:  SeriousBroker.it  This test is no t yet approved or cleared by the [place] FDA and  has been authorized for detection and/or diagnosis of SARS-CoV-2 by FDA under an Emergency Use Authorization (EUA). This EUA will remain  in effect (meaning this test can be used) for the duration of the COVID-19 declaration under Section 564(b)(1) of the Act, 21 U.S.C.section 360bbb-3(b)(1), unless the authorization is terminated  or revoked sooner.       Influenza A by PCR NEGATIVE NEGATIVE   Influenza B by PCR NEGATIVE NEGATIVE    Comment: (NOTE) The Xpert Xpress SARS-CoV-2/FLU/RSV plus assay is intended as an aid in the diagnosis of influenza from Nasopharyngeal swab specimens and should not be used as a sole basis for treatment. Nasal washings and aspirates are unacceptable for Xpert Xpress SARS-CoV-2/FLU/RSV testing.  Fact Sheet for Patients: BloggerCourse.com  Fact Sheet for Healthcare Providers: SeriousBroker.it  This test is not yet approved or cleared by the [place] FDA and has been authorized for detection and/or diagnosis of SARS-CoV-2 by FDA under an Emergency Use Authorization (EUA). This EUA will remain in effect (meaning this test can be used) for the duration of the COVID-19 declaration under Section 564(b)(1) of the Act, 21 U.S.C. section 360bbb-3(b)(1), unless the authorization is terminated or revoked.  Performed at [facility], [address], [place], [place] [address]     Current Facility-Administered Medications  Medication Dose Route Frequency Provider Last Rate Last Admin   ARIPiprazole (ABILIFY) tablet 10 mg  10 mg Oral Daily Georga Hacking, MD       aspirin EC tablet 81 mg  81 mg Oral Daily Georga Hacking, MD   81 mg at 02/02/22 1929   buPROPion [facility]) tablet 75 mg  75 mg Oral BID Georga Hacking, MD       divalproex (DEPAKOTE) DR tablet 500 mg  500 mg Oral BID Georga Hacking, MD       gemfibrozil (LOPID) tablet 600 mg  600 mg Oral BID Georga Hacking, MD       levothyroxine (SYNTHROID) tablet 88 mcg  88 mcg Oral Daily Georga Hacking, MD       lisinopril (ZESTRIL) tablet 10 mg  10 mg Oral Daily Georga Hacking, MD   10 mg at 02/02/22 1929   metFORMIN (GLUCOPHAGE) tablet 1,000 mg  1,000 mg Oral BID WC Georga Hacking, MD       pioglitazone (ACTOS) tablet 45 mg  45 mg Oral Daily Georga Hacking, MD       simvastatin (ZOCOR) tablet 20 mg  20 mg Oral QHS Georga Hacking, MD       Current Outpatient Medications  Medication Sig Dispense Refill   ARIPiprazole (ABILIFY) 10 MG tablet Take 10 mg by mouth  daily.     ARIPiprazole (ABILIFY) 20 MG tablet Take 1 tablet by mouth daily.  (Patient not taking: Reported on 02/02/2022)     aspirin 81 MG EC tablet Take 1 tablet by mouth daily.     buPROPion (WELLBUTRIN XL) 300 MG 24 hr tablet Take 300 mg by mouth daily. (Patient not taking: Reported on 02/02/2022)     buPROPion (WELLBUTRIN) 75 MG tablet Take 75 mg by mouth 2 (two) times daily.     cyclobenzaprine (FLEXERIL) 5 MG tablet Take 1 tablet (5 mg total) by mouth at bedtime. (Patient not taking: Reported on 01/18/2022) 30 tablet 0   divalproex (DEPAKOTE ER) 500 MG 24 hr tablet Take 1,000 mg by mouth at bedtime. (Patient not taking: Reported on 02/02/2022)     divalproex (DEPAKOTE) 500 MG DR tablet Take 500 mg by mouth 2 (two) times daily.     gemfibrozil (LOPID) 600 MG tablet Take 600 mg by mouth 2  (two) times daily.     Insulin Lispro Prot & Lispro (HUMALOG 75/25 MIX) (75-25) 100 UNIT/ML Kwikpen Inject into the skin.     levothyroxine (SYNTHROID, LEVOTHROID) 88 MCG tablet Take 88 mcg by mouth daily.     lidocaine (LIDODERM) 5 % Place 1 patch onto the skin every 12 (twelve) hours. Remove & Discard patch within 12 hours or as directed by MD (Patient not taking: Reported on 01/18/2022) 15 patch 0   lisinopril (PRINIVIL,ZESTRIL) 10 MG tablet Take 1 tablet by mouth daily.     metFORMIN (GLUCOPHAGE) 1000 MG tablet Take 1,000 mg by mouth 2 (two) times daily with a meal.     metoprolol succinate (TOPROL-XL) 50 MG 24 hr tablet Take 50 mg by mouth daily. (Patient not taking: Reported on 02/02/2022)     naproxen (NAPROSYN) 500 MG tablet Take 1 tablet (500 mg total) by mouth 2 (two) times daily with a meal. (Patient not taking: Reported on 01/18/2022) 20 tablet 0   NOVOLOG MIX 70/30 FLEXPEN (70-30) 100 UNIT/ML FlexPen Inject 34-44 Units into the skin 2 (two) times daily. 34 units-am 44units-pm     pioglitazone (ACTOS) 45 MG tablet Take 45 mg by mouth daily.     polyethylene glycol (MIRALAX / GLYCOLAX) 17 g packet Take 17 g by mouth daily. (Patient not taking: Reported on 08/30/2021) 14 each 0   simvastatin (ZOCOR) 20 MG tablet Take 20 mg by mouth at bedtime.      Musculoskeletal: Strength & Muscle Tone: within normal limits Gait & Station: normal Patient leans: N/A Psychiatric Specialty Exam:  Presentation  General Appearance: Disheveled  Eye Contact:None  Speech:Garbled; Pressured  Speech Volume:Increased  Handedness:Right   Mood and Affect  Mood:Dysphoric; Irritable  Affect:Inappropriate; Full Range   Thought Process  Thought Processes:Disorganized  Descriptions of Associations:Loose  Orientation:Partial  Thought Content:Illogical; Tangential; Scattered; Delusions; Paranoid Ideation  History of Schizophrenia/Schizoaffective disorder:Yes  Duration of Psychotic  Symptoms:Greater than six months  Hallucinations:Hallucinations: Visual  Ideas of Reference:Paranoia  Suicidal Thoughts:Suicidal Thoughts: No  Homicidal Thoughts:Homicidal Thoughts: No   Sensorium  Memory:Immediate Poor; Recent Poor; Remote Poor  Judgment:Impaired  Insight:Lacking   Executive Functions  Concentration:Poor  Attention Span:Poor  Recall:Poor  Fund of Knowledge:Poor  Language:Poor   Psychomotor Activity  Psychomotor Activity:Psychomotor Activity: Normal   Assets  Assets:Financial Resources/Insurance; Physical Health; Resilience; Social Support   Sleep  Sleep:Sleep: Poor   Physical Exam: Physical Exam Vitals and nursing note reviewed.  Constitutional:      General: He is in acute distress.  Appearance: He is obese.  HENT:     Head: Normocephalic and atraumatic.     Right Ear: External ear normal.     Left Ear: External ear normal.     Nose: Nose normal.     Mouth/Throat:     Mouth: Mucous membranes are moist.  Cardiovascular:     Rate and Rhythm: Bradycardia present.  Pulmonary:     Effort: Pulmonary effort is normal.  Musculoskeletal:        General: Normal range of motion.     Cervical back: Normal range of motion and neck supple.  Neurological:     Mental Status: He is alert. He is disoriented.  Psychiatric:        Attention and Perception: He is inattentive. He perceives auditory and visual hallucinations.        Mood and Affect: Mood is anxious. Affect is blunt and inappropriate.        Speech: Speech is rapid and pressured.        Behavior: Behavior is agitated, withdrawn and hyperactive.        Thought Content: Thought content is paranoid and delusional.        Cognition and Memory: Cognition is impaired. Memory is impaired.        Judgment: Judgment is impulsive and inappropriate.    Review of Systems  Psychiatric/Behavioral:  Positive for hallucinations. The patient is nervous/anxious and has insomnia.   All other  systems reviewed and are negative.  Blood pressure (!) 156/93, pulse (!) 54, temperature 98.2 F (36.8 C), resp. rate 15, height 6\' 4"  (1.93 m), SpO2 97 %. Body mass index is 30.83 kg/m.  Treatment Plan Summary: Medication management and Plan Patient does meet the criteria for psychiatric inpatient admission  Disposition: Recommend psychiatric Inpatient admission when medically cleared. Supportive therapy provided about ongoing stressors.  , NP 02/03/2022 12:34 AM

## 2022-02-03 NOTE — ED Notes (Signed)
Patient ate 100% of lunch and beverage, no signs of distress, He ate and then lay back down.

## 2022-02-03 NOTE — ED Provider Notes (Signed)
Emergency Medicine Observation Re-evaluation Note  Derrick Atkins is a 51 y.o. male, seen on rounds today.  Pt initially presented to the ED for complaints of Psychiatric Evaluation (/)  Currently, the patient is calm, no acute complaints.  Physical Exam  Blood pressure 133/69, pulse 82, temperature 97.9 F (36.6 C), temperature source Oral, resp. rate 17, height 6\' 4"  (1.93 m), SpO2 98 %. Physical Exam General: NAD Lungs: CTAB Psych: not agitated  ED Course / MDM  EKG:    I have reviewed the labs performed to date as well as medications administered while in observation.  Recent changes in the last 24 hours include no acute events overnight.    Plan  Current plan is for inpatient psych treatment. Patient is under full IVC at this time.   , MD 02/03/22 1123

## 2022-02-03 NOTE — ED Notes (Signed)
Patient ate 100% of supper and beverage. Nurse will continue to monitor.

## 2022-02-03 NOTE — ED Notes (Signed)

## 2022-02-03 NOTE — ED Notes (Signed)
Pt now hitting wall and windows that separate units. Has slammed door and is yelling toward where he believes AH are coming from. Secure chat is sent to Dr. York Cerise at this time for further guidance.

## 2022-02-03 NOTE — ED Notes (Signed)
Pt given snack. 

## 2022-02-04 MED ORDER — FENOFIBRATE 54 MG PO TABS
54.0000 mg | ORAL_TABLET | Freq: Every day | ORAL | Status: DC
  Administered 2022-02-05: 54 mg via ORAL
  Filled 2022-02-04: qty 1

## 2022-02-04 NOTE — ED Notes (Signed)
Pt given snack tray and drink 

## 2022-02-04 NOTE — ED Notes (Signed)
Pt given dry pants due to front of pants being wet

## 2022-02-04 NOTE — ED Notes (Signed)
Pt given dinner tray and drink at this time. 

## 2022-02-04 NOTE — ED Notes (Signed)
Pt given lunch tray and water  

## 2022-02-04 NOTE — ED Notes (Signed)
IVC/  PENDING  PLACEMENT 

## 2022-02-05 ENCOUNTER — Other Ambulatory Visit: Payer: Self-pay

## 2022-02-05 ENCOUNTER — Inpatient Hospital Stay
Admission: AD | Admit: 2022-02-05 | Discharge: 2022-02-24 | DRG: 885 | Disposition: A | Discharge status: Home / Self Care | Payer: Medicare Other | Source: Intra-hospital | Attending: Psychiatry | Admitting: Psychiatry

## 2022-02-05 ENCOUNTER — Encounter: Payer: Self-pay | Admitting: Psychiatry

## 2022-02-05 DIAGNOSIS — Z20822 Contact with and (suspected) exposure to covid-19: Secondary | ICD-10-CM | POA: Diagnosis present

## 2022-02-05 DIAGNOSIS — E119 Type 2 diabetes mellitus without complications: Secondary | ICD-10-CM | POA: Diagnosis present

## 2022-02-05 DIAGNOSIS — F200 Paranoid schizophrenia: Principal | ICD-10-CM | POA: Diagnosis present

## 2022-02-05 DIAGNOSIS — Z79899 Other long term (current) drug therapy: Secondary | ICD-10-CM

## 2022-02-05 DIAGNOSIS — G809 Cerebral palsy, unspecified: Secondary | ICD-10-CM | POA: Diagnosis present

## 2022-02-05 DIAGNOSIS — Z79890 Hormone replacement therapy (postmenopausal): Secondary | ICD-10-CM | POA: Diagnosis not present

## 2022-02-05 DIAGNOSIS — Z87442 Personal history of urinary calculi: Secondary | ICD-10-CM

## 2022-02-05 DIAGNOSIS — Z7984 Long term (current) use of oral hypoglycemic drugs: Secondary | ICD-10-CM

## 2022-02-05 DIAGNOSIS — I10 Essential (primary) hypertension: Secondary | ICD-10-CM | POA: Diagnosis present

## 2022-02-05 DIAGNOSIS — Z91148 Patient's other noncompliance with medication regimen for other reason: Secondary | ICD-10-CM

## 2022-02-05 DIAGNOSIS — Z7982 Long term (current) use of aspirin: Secondary | ICD-10-CM | POA: Diagnosis not present

## 2022-02-05 LAB — SARS CORONAVIRUS 2 BY RT PCR: SARS Coronavirus 2 by RT PCR: NEGATIVE

## 2022-02-05 LAB — GLUCOSE, CAPILLARY: Glucose-Capillary: 199 mg/dL — ABNORMAL HIGH (ref 70–99)

## 2022-02-05 MED ORDER — LEVOTHYROXINE SODIUM 88 MCG PO TABS: 88.0000 ug | ORAL_TABLET | Freq: Every day | ORAL | Status: DC

## 2022-02-05 MED ORDER — PIOGLITAZONE HCL 30 MG PO TABS: 45.0000 mg | ORAL_TABLET | Freq: Every day | ORAL | Status: DC

## 2022-02-05 MED ORDER — MAGNESIUM HYDROXIDE 400 MG/5ML PO SUSP
30.0000 mL | Freq: Every day | ORAL | Status: DC | PRN
  Administered 2022-02-10 – 2022-02-21 (×4): 30 mL via ORAL
  Filled 2022-02-05 (×4): qty 30

## 2022-02-05 MED ORDER — INSULIN ASPART PROT & ASPART (70-30 MIX) 100 UNIT/ML PEN: 34.0000 [IU] | PEN_INJECTOR | Freq: Two times a day (BID) | SUBCUTANEOUS | Status: DC

## 2022-02-05 MED ORDER — METFORMIN HCL 500 MG PO TABS
1000.0000 mg | ORAL_TABLET | Freq: Two times a day (BID) | ORAL | Status: DC
  Administered 2022-02-05 – 2022-02-24 (×38): 1000 mg via ORAL
  Filled 2022-02-05 (×41): qty 2

## 2022-02-05 MED ORDER — LEVOTHYROXINE SODIUM 88 MCG PO TABS
88.0000 ug | ORAL_TABLET | Freq: Every day | ORAL | Status: DC
  Administered 2022-02-06 – 2022-02-24 (×17): 88 ug via ORAL
  Filled 2022-02-05 (×20): qty 1

## 2022-02-05 MED ORDER — ALUM & MAG HYDROXIDE-SIMETH 200-200-20 MG/5ML PO SUSP: 30.0000 mL | ORAL | Status: DC | PRN

## 2022-02-05 MED ORDER — INSULIN ASPART PROT & ASPART (70-30 MIX) 100 UNIT/ML ~~LOC~~ SUSP
44.0000 [IU] | Freq: Every day | SUBCUTANEOUS | Status: DC
Start: 2022-02-05 — End: 2022-02-07
  Administered 2022-02-05 – 2022-02-06 (×2): 44 [IU] via SUBCUTANEOUS
  Filled 2022-02-05: qty 0.44

## 2022-02-05 MED ORDER — METFORMIN HCL 500 MG PO TABS: 1000.0000 mg | ORAL_TABLET | Freq: Two times a day (BID) | ORAL | Status: DC

## 2022-02-05 MED ORDER — INSULIN ASPART PROT & ASPART (70-30 MIX) 100 UNIT/ML PEN: 34.0000 [IU] | PEN_INJECTOR | Freq: Every day | SUBCUTANEOUS | Status: DC

## 2022-02-05 MED ORDER — DIVALPROEX SODIUM 500 MG PO DR TAB
500.0000 mg | DELAYED_RELEASE_TABLET | Freq: Two times a day (BID) | ORAL | Status: DC
  Administered 2022-02-05 – 2022-02-07 (×5): 500 mg via ORAL
  Filled 2022-02-05 (×5): qty 1

## 2022-02-05 MED ORDER — FENOFIBRATE 54 MG PO TABS
54.0000 mg | ORAL_TABLET | Freq: Every day | ORAL | Status: DC
  Administered 2022-02-06 – 2022-02-24 (×19): 54 mg via ORAL
  Filled 2022-02-05 (×19): qty 1

## 2022-02-05 MED ORDER — SIMVASTATIN 40 MG PO TABS
20.0000 mg | ORAL_TABLET | Freq: Every day | ORAL | Status: DC
  Administered 2022-02-06 – 2022-02-23 (×17): 20 mg via ORAL
  Filled 2022-02-05 (×18): qty 1

## 2022-02-05 MED ORDER — GEMFIBROZIL 600 MG PO TABS
600.0000 mg | ORAL_TABLET | Freq: Two times a day (BID) | ORAL | Status: DC
  Administered 2022-02-05 – 2022-02-24 (×38): 600 mg via ORAL
  Filled 2022-02-05 (×39): qty 1

## 2022-02-05 MED ORDER — BUPROPION HCL ER (XL) 150 MG PO TB24
150.0000 mg | ORAL_TABLET | Freq: Every day | ORAL | Status: DC
Start: 2022-02-06 — End: 2022-02-07
  Administered 2022-02-06 – 2022-02-07 (×2): 150 mg via ORAL
  Filled 2022-02-05 (×2): qty 1

## 2022-02-05 MED ORDER — BUPROPION HCL ER (XL) 150 MG PO TB24
150.0000 mg | ORAL_TABLET | Freq: Every day | ORAL | Status: DC
Start: 2022-02-05 — End: 2022-02-05

## 2022-02-05 MED ORDER — ACETAMINOPHEN 325 MG PO TABS
650.0000 mg | ORAL_TABLET | Freq: Four times a day (QID) | ORAL | Status: DC | PRN
  Administered 2022-02-06 – 2022-02-21 (×4): 650 mg via ORAL
  Filled 2022-02-05 (×4): qty 2

## 2022-02-05 MED ORDER — ARIPIPRAZOLE 10 MG PO TABS
10.0000 mg | ORAL_TABLET | Freq: Every day | ORAL | Status: DC
  Administered 2022-02-06 – 2022-02-07 (×2): 10 mg via ORAL
  Filled 2022-02-05 (×2): qty 1

## 2022-02-05 MED ORDER — LISINOPRIL 5 MG PO TABS
10.0000 mg | ORAL_TABLET | Freq: Every day | ORAL | Status: DC
  Administered 2022-02-07: 10 mg via ORAL
  Filled 2022-02-05 (×2): qty 2

## 2022-02-05 MED ORDER — LISINOPRIL 5 MG PO TABS: 10.0000 mg | ORAL_TABLET | Freq: Every day | ORAL | Status: DC

## 2022-02-05 MED ORDER — PIOGLITAZONE HCL 30 MG PO TABS
45.0000 mg | ORAL_TABLET | Freq: Every day | ORAL | Status: DC
  Administered 2022-02-06 – 2022-02-24 (×19): 45 mg via ORAL
  Filled 2022-02-05 (×19): qty 1

## 2022-02-05 MED ORDER — ASPIRIN 81 MG PO TBEC
81.0000 mg | DELAYED_RELEASE_TABLET | Freq: Every day | ORAL | Status: DC
  Administered 2022-02-06 – 2022-02-24 (×19): 81 mg via ORAL
  Filled 2022-02-05 (×19): qty 1

## 2022-02-05 NOTE — ED Notes (Signed)
Ivc /pending placement 

## 2022-02-05 NOTE — Plan of Care (Signed)
Problem: Education: Goal: Knowledge of General Education information will improve Description: Including pain rating scale, medication(s)/side effects and non-pharmacologic comfort measures Outcome: Not Progressing   Problem: Health Behavior/Discharge Planning: Goal: Ability to manage health-related needs will improve Outcome: Not Progressing   Problem: Clinical Measurements: Goal: Ability to maintain clinical measurements within normal limits will improve Outcome: Not Progressing Goal: Will remain free from infection Outcome: Not Progressing Goal: Diagnostic test results will improve Outcome: Not Progressing Goal: Respiratory complications will improve Outcome: Not Progressing Goal: Cardiovascular complication will be avoided Outcome: Not Progressing   Problem: Activity: Goal: Risk for activity intolerance will decrease Outcome: Not Progressing   Problem: Nutrition: Goal: Adequate nutrition will be maintained Outcome: Not Progressing   Problem: Coping: Goal: Level of anxiety will decrease Outcome: Not Progressing   Problem: Elimination: Goal: Will not experience complications related to bowel motility Outcome: Not Progressing Goal: Will not experience complications related to urinary retention Outcome: Not Progressing   Problem: Pain Managment: Goal: General experience of comfort will improve Outcome: Not Progressing   Problem: Safety: Goal: Ability to remain free from injury will improve Outcome: Not Progressing   Problem: Skin Integrity: Goal: Risk for impaired skin integrity will decrease Outcome: Not Progressing   Problem: Education: Goal: Knowledge of [facility] Education information/materials will improve Outcome: Not Progressing Goal: Emotional status will improve Outcome: Not Progressing Goal: Mental status will improve Outcome: Not Progressing Goal: Verbalization of understanding the information provided will improve Outcome: Not  Progressing   Problem: Activity: Goal: Interest or engagement in activities will improve Outcome: Not Progressing Goal: Sleeping patterns will improve Outcome: Not Progressing   Problem: Coping: Goal: Ability to verbalize frustrations and anger appropriately will improve Outcome: Not Progressing Goal: Ability to demonstrate self-control will improve Outcome: Not Progressing   Problem: Health Behavior/Discharge Planning: Goal: Identification of resources available to assist in meeting health care needs will improve Outcome: Not Progressing Goal: Compliance with treatment plan for underlying cause of condition will improve Outcome: Not Progressing   Problem: Physical Regulation: Goal: Ability to maintain clinical measurements within normal limits will improve Outcome: Not Progressing   Problem: Safety: Goal: Periods of time without injury will increase Outcome: Not Progressing   Problem: Education: Goal: Utilization of techniques to improve thought processes will improve Outcome: Not Progressing Goal: Knowledge of the prescribed therapeutic regimen will improve Outcome: Not Progressing   Problem: Activity: Goal: Interest or engagement in leisure activities will improve Outcome: Not Progressing Goal: Imbalance in normal sleep/wake cycle will improve Outcome: Not Progressing   Problem: Coping: Goal: Coping ability will improve Outcome: Not Progressing Goal: Will verbalize feelings Outcome: Not Progressing   Problem: Health Behavior/Discharge Planning: Goal: Ability to make decisions will improve Outcome: Not Progressing Goal: Compliance with therapeutic regimen will improve Outcome: Not Progressing   Problem: Role Relationship: Goal: Will demonstrate positive changes in social behaviors and relationships Outcome: Not Progressing   Problem: Safety: Goal: Ability to disclose and discuss suicidal ideas will improve Outcome: Not Progressing Goal: Ability to identify  and utilize support systems that promote safety will improve Outcome: Not Progressing   Problem: Self-Concept: Goal: Will verbalize positive feelings about self Outcome: Not Progressing Goal: Level of anxiety will decrease Outcome: Not Progressing   Problem: Education: Goal: Ability to state activities that reduce stress will improve Outcome: Not Progressing   Problem: Coping: Goal: Ability to identify and develop effective coping behavior will improve Outcome: Not Progressing   Problem: Self-Concept: Goal: Ability to identify factors that promote   anxiety will improve Outcome: Not Progressing Goal: Level of anxiety will decrease Outcome: Not Progressing Goal: Ability to modify response to factors that promote anxiety will improve Outcome: Not Progressing   

## 2022-02-05 NOTE — ED Notes (Signed)
Pt given snack. 

## 2022-02-05 NOTE — ED Notes (Signed)
Patient is sitting in the dayroom at this time.
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
## 2022-02-05 NOTE — Tx Team (Signed)
Initial Treatment Plan 02/05/2022 7:09 PM Derrick Atkins EHU:[id number]    PATIENT STRESSORS: Financial difficulties   Health problems     PATIENT STRENGTHS: Motivation for treatment/growth    PATIENT IDENTIFIED PROBLEMS: Ineffective coping skill  Financial difficulty  Decline in physical health                 DISCHARGE CRITERIA:  Improved stabilization in mood, thinking, and/or behavior Motivation to continue treatment in a less acute level of care  PRELIMINARY DISCHARGE PLAN: Outpatient therapy Placement in alternative living arrangements  PATIENT/FAMILY INVOLVEMENT: This treatment plan has been presented to and reviewed with the patient, Derrick Atkins, and/or family member.  The patient and family have been given the opportunity to ask questions and make suggestions.  Sharin Mons, RN 02/05/2022, 7:09 PM

## 2022-02-05 NOTE — Consult Note (Signed)
[facility] [facility] Psychiatry Consult   Reason for Consult:Psychiatric Evaluation (/) Referring Physician: Dr. Starleen Blue Patient Identification: Derrick Atkins MRN:  [id number] Principal Diagnosis: Schizophrenia, paranoid, chronic ([place]) Diagnosis:  Principal Problem:   Schizophrenia, paranoid, chronic ([place]) Active Problems:   Long QT interval   Type 2 diabetes mellitus without complications ([employer])   Total Time spent with patient: 30 minutes  Subjective: "I'm alright."  Client is slow to respond, sitting on his bed starring into space.  He continues to respond to internal stimuli, no agitation noted on assessment or by staff.  Inpatient psychiatric admission still warranted.  On admission HPI per Caroline Sauger, PMHNP: [name] is a 50 y.o. male patient presented to [facility] ED via law enforcement under involuntary commitment status (IVC). It was reported that the patient was discharged from an inpatient facility on 01/29/22 and decompensating since his discharge. Per the report, the patient was disheveled with weight loss, paranoid, and hyper-religious. The patient was seen sitting in the corner of his room while being assessed. He was in a ball, yelling, "they are coming to kill me." "I am fighting a war; they want to get me." The patient was encouraged to breathe and calm down. He listened for short periods, and he would begin yelling that people were after him.  This provider saw The patient face-to-face; the chart was reviewed, and consulted with Dr.McHugh on 02/02/2022 due to the patient's care. It was discussed with the EDP that the patient does meet the criteria to be admitted to the psychiatric inpatient unit.  On evaluation, the patient is alert and oriented x 1-2, anxious, and mood-congruent with affect. The patient does appear to be responding to internal and external stimuli. The patient is presenting with delusional thinking. The patient is experiencing auditory and visual  hallucinations. The patient is presenting with psychotic and paranoid behaviors.   HPI: Per Dr. Starleen Blue, Derrick Atkins is a 51 y.o. male with past medical history of cerebral palsy, schizoaffective disorder, hypertension diabetes presents under IVC.  Patient was discharged on 721 from inpatient psych but his psychiatrist was concerned that he has been decompensating since not eating has had weight loss paranoia and is hyperreligious.  Patient tells me that he does not know why he is here he just got out of the hospital and he feels like he has been doing well denies suicidal ideation or hallucinations does say he does not eat very much cannot tell me why.  He lives alone currently.  Says he has been trying to take his medications as prescribed and has a bubble pack for it.  No other complaints currently.  Past Psychiatric History:  Schizoaffective disorder ([place]) Depression  Risk to Self:    Risk to Others:   Prior Inpatient Therapy:   Prior Outpatient Therapy:    Past Medical History:  Past Medical History:  Diagnosis Date   Cerebral palsy ([place])    Depression    Diabetes mellitus without complication ([place])    Hypertension    Kidney stones    Schizoaffective disorder ([place])    Scoliosis     Past Surgical History:  Procedure Laterality Date   BOWEL RESECTION     LAPAROTOMY N/A 05/15/2020   Procedure: EXPLORATORY LAPAROTOMY;  Surgeon: Olean Ree, MD;  Location: [facility] ORS;  Service: General;  Laterality: N/A;   Family History:  Family History  Problem Relation Age of Onset   Heart disease Father    Heart attack Father  Family Psychiatric  History:  Social History:  Social History   Substance and Sexual Activity  Alcohol Use No     Social History   Substance and Sexual Activity  Drug Use No    Social History   Socioeconomic History   Marital status: Single    Spouse name: Not on file   Number of children: Not on file   Years of education: Not on file   Highest  education level: Not on file  Occupational History   Not on file  Tobacco Use   Smoking status: Never   Smokeless tobacco: Never  Vaping Use   Vaping Use: Never used  Substance and Sexual Activity   Alcohol use: No   Drug use: No   Sexual activity: Not on file  Other Topics Concern   Not on file  Social History Narrative   Not on file   Social Determinants of Health   Financial Resource Strain: Not on file  Food Insecurity: Not on file  Transportation Needs: Not on file  Physical Activity: Not on file  Stress: Not on file  Social Connections: Not on file   Additional Social History:    Allergies:   Allergies  Allergen Reactions   Phenytoin Sodium Extended Other (See Comments) and Nausea And Vomiting   Prednisone Other (See Comments)   Latex Hives, Nausea And Vomiting and Rash    Labs:  No results found for this or any previous visit (from the past 48 hour(s)).   Current Facility-Administered Medications  Medication Dose Route Frequency Provider Last Rate Last Admin   ARIPiprazole (ABILIFY) tablet 10 mg  10 mg Oral Daily Georga Hacking, MD   10 mg at 02/05/22 0934   aspirin EC tablet 81 mg  81 mg Oral Daily Georga Hacking, MD   81 mg at 02/05/22 0934   buPROPion [facility]) tablet 75 mg  75 mg Oral BID Georga Hacking, MD   75 mg at 02/05/22 0936   divalproex (DEPAKOTE) DR tablet 500 mg  500 mg Oral BID Georga Hacking, MD   500 mg at 02/05/22 3220   fenofibrate tablet 54 mg  54 mg Oral Daily Merwyn Katos, MD   54 mg at 02/05/22 0936   levothyroxine (SYNTHROID) tablet 88 mcg  88 mcg Oral Daily Georga Hacking, MD   88 mcg at 02/05/22 0915   lisinopril (ZESTRIL) tablet 10 mg  10 mg Oral Daily Georga Hacking, MD   10 mg at 02/05/22 0934   metFORMIN (GLUCOPHAGE) tablet 1,000 mg  1,000 mg Oral BID WC Georga Hacking, MD   1,000 mg at 02/05/22 0915   pioglitazone (ACTOS) tablet 45 mg  45 mg Oral Daily Georga Hacking, MD   45 mg at 02/05/22  0935   simvastatin (ZOCOR) tablet 20 mg  20 mg Oral QHS Georga Hacking, MD   20 mg at 02/04/22 2140   Current Outpatient Medications  Medication Sig Dispense Refill   ARIPiprazole (ABILIFY) 10 MG tablet Take 10 mg by mouth daily.     ARIPiprazole (ABILIFY) 20 MG tablet Take 1 tablet by mouth daily.  (Patient not taking: Reported on 02/02/2022)     aspirin 81 MG EC tablet Take 1 tablet by mouth daily.     buPROPion (WELLBUTRIN XL) 300 MG 24 hr tablet Take 300 mg by mouth daily. (Patient not taking: Reported on 02/02/2022)     buPROPion (WELLBUTRIN) 75 MG tablet Take 75 mg  by mouth 2 (two) times daily.     cyclobenzaprine (FLEXERIL) 5 MG tablet Take 1 tablet (5 mg total) by mouth at bedtime. (Patient not taking: Reported on 01/18/2022) 30 tablet 0   divalproex (DEPAKOTE ER) 500 MG 24 hr tablet Take 1,000 mg by mouth at bedtime. (Patient not taking: Reported on 02/02/2022)     divalproex (DEPAKOTE) 500 MG DR tablet Take 500 mg by mouth 2 (two) times daily.     gemfibrozil (LOPID) 600 MG tablet Take 600 mg by mouth 2 (two) times daily.     Insulin Lispro Prot & Lispro (HUMALOG 75/25 MIX) (75-25) 100 UNIT/ML Kwikpen Inject into the skin.     levothyroxine (SYNTHROID, LEVOTHROID) 88 MCG tablet Take 88 mcg by mouth daily.     lidocaine (LIDODERM) 5 % Place 1 patch onto the skin every 12 (twelve) hours. Remove & Discard patch within 12 hours or as directed by MD (Patient not taking: Reported on 01/18/2022) 15 patch 0   lisinopril (PRINIVIL,ZESTRIL) 10 MG tablet Take 1 tablet by mouth daily.     metFORMIN (GLUCOPHAGE) 1000 MG tablet Take 1,000 mg by mouth 2 (two) times daily with a meal.     metoprolol succinate (TOPROL-XL) 50 MG 24 hr tablet Take 50 mg by mouth daily. (Patient not taking: Reported on 02/02/2022)     naproxen (NAPROSYN) 500 MG tablet Take 1 tablet (500 mg total) by mouth 2 (two) times daily with a meal. (Patient not taking: Reported on 01/18/2022) 20 tablet 0   NOVOLOG MIX 70/30 FLEXPEN  (70-30) 100 UNIT/ML FlexPen Inject 34-44 Units into the skin 2 (two) times daily. 34 units-am 44units-pm     pioglitazone (ACTOS) 45 MG tablet Take 45 mg by mouth daily.     polyethylene glycol (MIRALAX / GLYCOLAX) 17 g packet Take 17 g by mouth daily. (Patient not taking: Reported on 08/30/2021) 14 each 0   simvastatin (ZOCOR) 20 MG tablet Take 20 mg by mouth at bedtime.      Musculoskeletal: Strength & Muscle Tone: within normal limits Gait & Station: normal Patient leans: N/A  Psychiatric Specialty Exam: Physical Exam Vitals and nursing note reviewed.  HENT:     Head: Normocephalic.     Nose: Nose normal.  Pulmonary:     Effort: Pulmonary effort is normal.  Musculoskeletal:        General: Normal range of motion.     Cervical back: Normal range of motion.  Neurological:     General: No focal deficit present.     Mental Status: He is alert.  Psychiatric:        Attention and Perception: He perceives auditory and visual hallucinations.        Mood and Affect: Affect is blunt.        Speech: Speech normal.        Behavior: Behavior is slowed.        Thought Content: Thought content is paranoid.        Cognition and Memory: Cognition is impaired. Memory is impaired.        Judgment: Judgment normal.     Review of Systems  Psychiatric/Behavioral:  Positive for hallucinations.   All other systems reviewed and are negative.   Blood pressure (!) 158/76, pulse 100, temperature 98 F (36.7 C), temperature source Oral, resp. rate 18, height 6\' 4"  (1.93 m), SpO2 99 %.Body mass index is 30.83 kg/m.  General Appearance: Casual  Eye Contact:  Good  Speech:  Slow  Volume:  Decreased  Mood:  Depressed  Affect:  Flat  Thought Process:  Coherent  Orientation:  Full (Time, Place, and Person)  Thought Content:  Hallucinations: Auditory Visual  Suicidal Thoughts:  No  Homicidal Thoughts:  No  Memory:  Immediate;   Fair Recent;   Fair Remote;   Fair  Judgement:  Impaired   Insight:  Fair  Psychomotor Activity:  Decreased  Concentration:  Concentration: Fair and Attention Span: Fair  Recall:  [employer] of Knowledge:  Fair  Language:  Good  Akathisia:  No  Handed:  Right  AIMS (if indicated):     Assets:  Leisure Time Physical Health Resilience Social Support  ADL's:  Intact  Cognition:  Impaired,  Mild  Sleep:         Physical Exam: Physical Exam Vitals and nursing note reviewed.  HENT:     Head: Normocephalic.     Nose: Nose normal.  Pulmonary:     Effort: Pulmonary effort is normal.  Musculoskeletal:        General: Normal range of motion.     Cervical back: Normal range of motion.  Neurological:     General: No focal deficit present.     Mental Status: He is alert.  Psychiatric:        Attention and Perception: He perceives auditory and visual hallucinations.        Mood and Affect: Affect is blunt.        Speech: Speech normal.        Behavior: Behavior is slowed.        Thought Content: Thought content is paranoid.        Cognition and Memory: Cognition is impaired. Memory is impaired.        Judgment: Judgment normal.    Review of Systems  Psychiatric/Behavioral:  Positive for hallucinations.   All other systems reviewed and are negative.  Blood pressure (!) 158/76, pulse 100, temperature 98 F (36.7 C), temperature source Oral, resp. rate 18, height 6\' 4"  (1.93 m), SpO2 99 %. Body mass index is 30.83 kg/m.  Treatment Plan Summary: Schizophrenia, paranoid type: Depakote 500 mg BID Abilify 10 mg daily Wellbutrin XL 150 mg daily  Disposition: Recommend psychiatric Inpatient admission when medically cleared. Supportive therapy provided about ongoing stressors.  Waylan Boga, NP 02/05/2022 11:45 AM

## 2022-02-06 DIAGNOSIS — F200 Paranoid schizophrenia: Secondary | ICD-10-CM

## 2022-02-06 LAB — GLUCOSE, CAPILLARY
Glucose-Capillary: 92 mg/dL (ref 70–99)
Glucose-Capillary: 55 mg/dL — ABNORMAL LOW (ref 70–99)
Glucose-Capillary: 96 mg/dL (ref 70–99)
Glucose-Capillary: 94 mg/dL (ref 70–99)

## 2022-02-06 MED ORDER — ONDANSETRON HCL 4 MG PO TABS
4.0000 mg | ORAL_TABLET | Freq: Four times a day (QID) | ORAL | Status: DC | PRN
  Administered 2022-02-06: 4 mg via ORAL
  Filled 2022-02-06: qty 1

## 2022-02-06 MED ORDER — OLANZAPINE 5 MG PO TABS
5.0000 mg | ORAL_TABLET | Freq: Once | ORAL | Status: AC
  Administered 2022-02-06: 5 mg via ORAL
  Filled 2022-02-06: qty 1

## 2022-02-06 MED ORDER — INSULIN ASPART PROT & ASPART (70-30 MIX) 100 UNIT/ML ~~LOC~~ SUSP
34.0000 [IU] | Freq: Every day | SUBCUTANEOUS | Status: DC
  Administered 2022-02-06 – 2022-02-07 (×2): 34 [IU] via SUBCUTANEOUS

## 2022-02-06 NOTE — Plan of Care (Signed)
Pt rates depression and anxiety both 3/10. Pt denies SI, HI and AVH. Pt ws educated on care plan and verbalizes understanding. Torrie Mayers RN Problem: Nutrition: Goal: Adequate nutrition will be maintained Outcome: Progressing   Problem: Coping: Goal: Level of anxiety will decrease Outcome: Progressing   Problem: Safety: Goal: Ability to remain free from injury will improve Outcome: Progressing   Problem: Education: Goal: Verbalization of understanding the information provided will improve Outcome: Progressing   Problem: Activity: Goal: Sleeping patterns will improve Outcome: Progressing   Problem: Coping: Goal: Ability to verbalize frustrations and anger appropriately will improve Outcome: Progressing Goal: Ability to demonstrate self-control will improve Outcome: Progressing

## 2022-02-06 NOTE — Progress Notes (Signed)
Pt took a shower. Torrie Mayers RN

## 2022-02-06 NOTE — Progress Notes (Signed)
Pt said that he hit his right arm , underside elbow on the table while sitting his tray down.Skin was not broken just a little pink. He wanted tylenol.
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
## 2022-02-06 NOTE — Group Note (Signed)
LCSW Group Therapy Note  Group Date: 02/06/2022 Start Time: 1500 End Time: 1510   Type of Therapy and Topic:  Group Therapy - How To Cope with Nervousness about Discharge   Participation Level:  Did Not Attend   Description of Group This process group involved identification of patients' feelings about discharge. Some of them are scheduled to be discharged soon, while others are new admissions, but each of them was asked to share thoughts and feelings surrounding discharge from the hospital. One common theme was that they are excited at the prospect of going home, while another was that many of them are apprehensive about sharing why they were hospitalized. Patients were given the opportunity to discuss these feelings with their peers in preparation for discharge.  Therapeutic Goals  Patient will identify their overall feelings about pending discharge. Patient will think about how they might proactively address issues that they believe will once again arise once they get home (i.e. with parents). Patients will participate in discussion about having hope for change.   Summary of Patient Progress:   X   Therapeutic Modalities Cognitive Behavioral Therapy   Dalis Beers A Dereke Neumann, LCSWA 02/06/2022  3:35 PM   

## 2022-02-06 NOTE — H&P (Signed)
Psychiatric Admission Assessment Adult  Patient Identification: Derrick Atkins MRN:  [id number] Date of Evaluation:  02/06/2022 Chief Complaint:  Schizophrenia, paranoid type ([place]) [F20.0] Principal Diagnosis: Schizophrenia, paranoid type ([place]) Diagnosis:  Principal Problem:   Schizophrenia, paranoid type ([place])  History of Present Illness: Derrick Atkins is a 51 y.o. male patient presented to [facility] ED via law enforcement under involuntary commitment status (IVC). It was reported that the patient was discharged from an inpatient facility on 01/29/22 and decompensating since his discharge. Per the report, the patient was disheveled with weight loss, paranoid, and hyper-religious. The patient was seen sitting in the corner of his room while being assessed. He was in a ball, yelling, "they are coming to kill me." "I am fighting a war; they want to get me." The patient was encouraged to breathe and calm down. He listened for short periods, and he would begin yelling that people were after him.  On evaluation, the patient is alert and oriented x 1-2, anxious, and mood-congruent with affect. The patient does appear to be responding to internal and external stimuli. The patient is presenting with delusional thinking. The patient is experiencing auditory and visual hallucinations. The patient is presenting with psychotic and paranoid behaviors.    HPI: Per Dr. Starleen Blue, Derrick Atkins is a 51 y.o. male with past medical history of cerebral palsy, schizoaffective disorder, hypertension diabetes presents under IVC.  Patient was discharged on 721 from inpatient psych but his psychiatrist was concerned that he has been decompensating since not eating has had weight loss paranoia and is hyperreligious.  Patient tells me that he does not know why he is here he just got out of the hospital and he feels like he has been doing well denies suicidal ideation or hallucinations does say he does not eat very much cannot  tell me why.  He lives alone currently.  Says he has been trying to take his medications as prescribed and has a bubble pack for it.  No other complaints currently. Associated Signs/Symptoms: Depression Symptoms:  depressed mood, anxiety, Duration of Depression Symptoms: Greater than two weeks  (Hypo) Manic Symptoms:  Flight of Ideas, Labiality of Mood, Anxiety Symptoms:  Excessive Worry, Psychotic Symptoms:  Paranoia, PTSD Symptoms: NA Total Time spent with patient: 1 hour  Past Psychiatric History: Patient is currently being followed by [name].  Recent discharge from psychiatric hospitalization in [place].  Is the patient at risk to self? Yes.    Has the patient been a risk to self in the past 6 months? Yes.    Has the patient been a risk to self within the distant past? Yes.    Is the patient a risk to others? No.  Has the patient been a risk to others in the past 6 months? No.  Has the patient been a risk to others within the distant past? No.   [place] Scale:  [place] Admission (Current) from 02/05/2022 in [place] ED from 02/02/2022 in [place] ED from 01/18/2022 in [place] No Risk No Risk No Risk        Prior Inpatient Therapy:   Prior Outpatient Therapy:    Alcohol Screening: 1. How often do you have a drink containing alcohol?: Never 2. How many drinks containing alcohol do you have on a typical day when you are drinking?: 1 or 2 3. How often do you have  six or more drinks on one occasion?: Never AUDIT-C Score: 0 4. How often during the last year have you found that you were not able to stop drinking once you had started?: Never 5. How often during the last year have you failed to do what was normally expected from you because of drinking?: Never 6. How often during the last year have you needed a first drink in  the morning to get yourself going after a heavy drinking session?: Never 7. How often during the last year have you had a feeling of guilt of remorse after drinking?: Never 8. How often during the last year have you been unable to remember what happened the night before because you had been drinking?: Never 9. Have you or someone else been injured as a result of your drinking?: No 10. Has a relative or friend or a doctor or another health worker been concerned about your drinking or suggested you cut down?: No Alcohol Use Disorder Identification Test Final Score (AUDIT): 0 Substance Abuse History in the last 12 months:  No. Consequences of Substance Abuse: NA Previous Psychotropic Medications: Yes  Psychological Evaluations: Yes  Past Medical History:  Past Medical History:  Diagnosis Date   Cerebral palsy ([place])    Depression    Diabetes mellitus without complication ([place])    Hypertension    Kidney stones    Schizoaffective disorder ([place])    Scoliosis     Past Surgical History:  Procedure Laterality Date   BOWEL RESECTION     LAPAROTOMY N/A 05/15/2020   Procedure: EXPLORATORY LAPAROTOMY;  Surgeon: Olean Ree, MD;  Location: [facility] ORS;  Service: General;  Laterality: N/A;   Family History:  Family History  Problem Relation Age of Onset   Heart disease Father    Heart attack Father    Family Psychiatric  History: Unremarkable Tobacco Screening:   Social History:  Social History   Substance and Sexual Activity  Alcohol Use No     Social History   Substance and Sexual Activity  Drug Use No    Additional Social History:                           Allergies:   Allergies  Allergen Reactions   Phenytoin Sodium Extended Other (See Comments) and Nausea And Vomiting   Prednisone Other (See Comments)   Latex Hives, Nausea And Vomiting and Rash   Lab Results:  Results for orders placed or performed during the hospital encounter of 02/05/22 (from the past 48  hour(s))  Glucose, capillary     Status: Abnormal   Collection Time: 02/05/22  7:04 PM  Result Value Ref Range   Glucose-Capillary 199 (H) 70 - 99 mg/dL    Comment: Glucose reference range applies only to samples taken after fasting for at least 8 hours.  Glucose, capillary     Status: None   Collection Time: 02/06/22  6:55 AM  Result Value Ref Range   Glucose-Capillary 92 70 - 99 mg/dL    Comment: Glucose reference range applies only to samples taken after fasting for at least 8 hours.    Blood Alcohol level:  Lab Results  Component Value Date   [facility] <10 02/02/2022   ETH <10 0000000    Metabolic Disorder Labs:  Lab Results  Component Value Date   HGBA1C 5.7 (H) 05/13/2020   MPG 116.89 05/13/2020   No results found for: "PROLACTIN" Lab Results  Component Value  Date   TRIG 201 (H) 05/19/2020    Current Medications: Current Facility-Administered Medications  Medication Dose Route Frequency Provider Last Rate Last Admin   acetaminophen (TYLENOL) tablet 650 mg  650 mg Oral Q6H PRN Charm Rings, NP       alum & mag hydroxide-simeth (MAALOX/MYLANTA) 200-200-20 MG/5ML suspension 30 mL  30 mL Oral Q4H PRN Charm Rings, NP       ARIPiprazole (ABILIFY) tablet 10 mg  10 mg Oral Daily Charm Rings, NP   10 mg at 02/06/22 3716   aspirin EC tablet 81 mg  81 mg Oral Daily Charm Rings, NP   81 mg at 02/06/22 0803   buPROPion (WELLBUTRIN XL) 24 hr tablet 150 mg  150 mg Oral Daily Charm Rings, NP   150 mg at 02/06/22 0805   divalproex (DEPAKOTE) DR tablet 500 mg  500 mg Oral BID Charm Rings, NP   500 mg at 02/06/22 0802   fenofibrate tablet 54 mg  54 mg Oral Daily Charm Rings, NP   54 mg at 02/06/22 0803   gemfibrozil (LOPID) tablet 600 mg  600 mg Oral BID Charm Rings, NP   600 mg at 02/06/22 0803   insulin aspart protamine- aspart (NOVOLOG MIX 70/30) injection 34 Units  34 Units Subcutaneous Q breakfast [name], [name], MD   34 Units at 02/06/22 0817    insulin aspart protamine- aspart (NOVOLOG MIX 70/30) injection 44 Units  44 Units Subcutaneous Q supper Foye Deer, RPH   44 Units at 02/05/22 1907   levothyroxine (SYNTHROID) tablet 88 mcg  88 mcg Oral Daily Charm Rings, NP   88 mcg at 02/06/22 0806   lisinopril (ZESTRIL) tablet 10 mg  10 mg Oral Daily Charm Rings, NP       magnesium hydroxide (MILK OF MAGNESIA) suspension 30 mL  30 mL Oral Daily PRN Charm Rings, NP       metFORMIN (GLUCOPHAGE) tablet 1,000 mg  1,000 mg Oral BID WC Charm Rings, NP   1,000 mg at 02/06/22 0802   pioglitazone (ACTOS) tablet 45 mg  45 mg Oral Daily Charm Rings, NP   45 mg at 02/06/22 0804   simvastatin (ZOCOR) tablet 20 mg  20 mg Oral QHS Charm Rings, NP       PTA Medications: Medications Prior to Admission  Medication Sig Dispense Refill Last Dose   ARIPiprazole (ABILIFY) 10 MG tablet Take 10 mg by mouth daily.      ARIPiprazole (ABILIFY) 20 MG tablet Take 1 tablet by mouth daily.  (Patient not taking: Reported on 02/02/2022)      aspirin 81 MG EC tablet Take 1 tablet by mouth daily.      buPROPion (WELLBUTRIN XL) 300 MG 24 hr tablet Take 300 mg by mouth daily. (Patient not taking: Reported on 02/02/2022)      buPROPion (WELLBUTRIN) 75 MG tablet Take 75 mg by mouth 2 (two) times daily.      cyclobenzaprine (FLEXERIL) 5 MG tablet Take 1 tablet (5 mg total) by mouth at bedtime. (Patient not taking: Reported on 01/18/2022) 30 tablet 0    divalproex (DEPAKOTE ER) 500 MG 24 hr tablet Take 1,000 mg by mouth at bedtime. (Patient not taking: Reported on 02/02/2022)      divalproex (DEPAKOTE) 500 MG DR tablet Take 500 mg by mouth 2 (two) times daily.      gemfibrozil (LOPID) 600 MG tablet  Take 600 mg by mouth 2 (two) times daily.      Insulin Lispro Prot & Lispro (HUMALOG 75/25 MIX) (75-25) 100 UNIT/ML Kwikpen Inject into the skin.      levothyroxine (SYNTHROID, LEVOTHROID) 88 MCG tablet Take 88 mcg by mouth daily.      lidocaine (LIDODERM) 5 %  Place 1 patch onto the skin every 12 (twelve) hours. Remove & Discard patch within 12 hours or as directed by MD (Patient not taking: Reported on 01/18/2022) 15 patch 0    lisinopril (PRINIVIL,ZESTRIL) 10 MG tablet Take 1 tablet by mouth daily.      metFORMIN (GLUCOPHAGE) 1000 MG tablet Take 1,000 mg by mouth 2 (two) times daily with a meal.      metoprolol succinate (TOPROL-XL) 50 MG 24 hr tablet Take 50 mg by mouth daily. (Patient not taking: Reported on 02/02/2022)      naproxen (NAPROSYN) 500 MG tablet Take 1 tablet (500 mg total) by mouth 2 (two) times daily with a meal. (Patient not taking: Reported on 01/18/2022) 20 tablet 0    NOVOLOG MIX 70/30 FLEXPEN (70-30) 100 UNIT/ML FlexPen Inject 34-44 Units into the skin 2 (two) times daily. 34 units-am 44units-pm      pioglitazone (ACTOS) 45 MG tablet Take 45 mg by mouth daily.      polyethylene glycol (MIRALAX / GLYCOLAX) 17 g packet Take 17 g by mouth daily. (Patient not taking: Reported on 08/30/2021) 14 each 0    simvastatin (ZOCOR) 20 MG tablet Take 20 mg by mouth at bedtime.       Musculoskeletal: Strength & Muscle Tone: decreased Gait & Station: unsteady Patient leans: N/A            Psychiatric Specialty Exam:  Presentation  General Appearance: Disheveled  Eye Contact:None  Speech:Garbled; Pressured  Speech Volume:Increased  Handedness:Right   Mood and Affect  Mood:Dysphoric; Irritable  Affect:Inappropriate; Full Range   Thought Process  Thought Processes:Disorganized  Duration of Psychotic Symptoms: Greater than six months  Past Diagnosis of Schizophrenia or Psychoactive disorder: Yes  Descriptions of Associations:Loose  Orientation:Partial  Thought Content:Illogical; Tangential; Scattered; Delusions; Paranoid Ideation  Hallucinations:No data recorded Ideas of Reference:Paranoia  Suicidal Thoughts:No data recorded Homicidal Thoughts:No data recorded  Sensorium  Memory:Immediate Poor; Recent  Poor; Remote Poor  Judgment:Impaired  Insight:Lacking   Executive Functions  Concentration:Poor  Attention Span:Poor  Recall:Poor  Fund of Knowledge:Poor  Language:Poor   Psychomotor Activity  Psychomotor Activity:No data recorded  Assets  Assets:Financial Resources/Insurance; Physical Health; Resilience; Social Support   Sleep  Sleep:No data recorded   Physical Exam: Physical Exam Vitals and nursing note reviewed.  Constitutional:      Appearance: Normal appearance. He is normal weight.  HENT:     Head: Normocephalic and atraumatic.     Nose: Nose normal.     Mouth/Throat:     Pharynx: Oropharynx is clear.  Eyes:     Extraocular Movements: Extraocular movements intact.     Pupils: Pupils are equal, round, and reactive to light.  Cardiovascular:     Rate and Rhythm: Normal rate and regular rhythm.     Pulses: Normal pulses.     Heart sounds: Normal heart sounds.  Pulmonary:     Effort: Pulmonary effort is normal.     Breath sounds: Normal breath sounds.  Abdominal:     General: Abdomen is flat. Bowel sounds are normal.     Palpations: Abdomen is soft.  Musculoskeletal:  General: Normal range of motion.     Cervical back: Normal range of motion and neck supple.  Skin:    General: Skin is warm and dry.  Neurological:     General: No focal deficit present.     Mental Status: He is alert and oriented to person, place, and time.  Psychiatric:        Attention and Perception: Attention normal. He perceives auditory hallucinations.        Mood and Affect: Mood is depressed. Affect is flat.        Speech: Speech is delayed and tangential.        Behavior: Behavior normal. Behavior is cooperative.        Thought Content: Thought content is paranoid.        Cognition and Memory: Cognition is impaired. Memory is impaired.        Judgment: Judgment is inappropriate.    Review of Systems  Constitutional: Negative.   HENT: Negative.    Eyes: Negative.    Respiratory: Negative.    Cardiovascular: Negative.   Gastrointestinal: Negative.   Genitourinary: Negative.   Musculoskeletal: Negative.   Skin: Negative.   Neurological: Negative.   Endo/Heme/Allergies: Negative.   Psychiatric/Behavioral:  Positive for depression and hallucinations.    Blood pressure (!) 130/59, pulse (!) 103, temperature 98.8 F (37.1 C), temperature source Oral, resp. rate 18, height 6\' 4"  (1.93 m), weight 109.3 kg, SpO2 100 %. Body mass index is 29.34 kg/m.  Treatment Plan Summary: Daily contact with patient to assess and evaluate symptoms and progress in treatment, Medication management, and Plan see orders  Observation Level/Precautions:  15 minute checks  Laboratory:  CBC Chemistry Profile  Psychotherapy:    Medications:    Consultations:    Discharge Concerns:    Estimated LOS:  Other:     Physician Treatment Plan for Primary Diagnosis: Schizophrenia, paranoid type (HCC) Long Term Goal(s): Improvement in symptoms so as ready for discharge  Short Term Goals: Ability to identify changes in lifestyle to reduce recurrence of condition will improve, Ability to verbalize feelings will improve, Ability to disclose and discuss suicidal ideas, Ability to demonstrate self-control will improve, Ability to identify and develop effective coping behaviors will improve, Ability to maintain clinical measurements within normal limits will improve, Compliance with prescribed medications will improve, and Ability to identify triggers associated with substance abuse/mental health issues will improve  Physician Treatment Plan for Secondary Diagnosis: Principal Problem:   Schizophrenia, paranoid type (HCC)   I certify that inpatient services furnished can reasonably be expected to improve the patient's condition.    , DO 7/29/202310:47 AM

## 2022-02-06 NOTE — [facility] Counselor (Signed)
CSW made first attempt to complete PSA with pt. Pt was with PT completing therapy. CSW will come at a more opportune time to complete PSA.   Rubby Barbary [place], MSW, LCSW-A 7/29/20233:01 PM

## 2022-02-06 NOTE — Evaluation (Signed)
Physical Therapy Evaluation Patient Details Name: Derrick Atkins MRN: [id number] DOB: 12-02-1970 Today's Date: 02/06/2022  History of Present Illness  PHU RECORD is a 50yoM who [facility] via [facility] under IVC with facility reports of decompensating since DC 7/21. PMH: cerebral palsy, HTN, DM, LEE, and schizoaffective disorder. Pt reports he tripped day of evaluation, feet got tangled up while trying to rise from table and he fell and hit his right elbow, says it feels ok right now after getting some pain medication earlier.  Clinical Impression  Pt admitted with above diagnosis. Pt currently with functional limitations due to the deficits listed below (see "PT Problem List"). Upon entry, pt in chair in room, awake and agreeable to participate. The pt is alert, pleasant, interactive, but not able to provide good detailed info regarding prior level of function, both in tolerance and independence. Pt is flat of affect, delayed in his responses, has difficulty finding words to express self at times, sometimes, is tangential part of the time with concerns regarding issues he's having with his bed (later addressed by Thereasa Parkin). He appears quite weak with transfers attempts, attempts to use RW for stability but still does not appear especially stable. He looks like he feels generally poor or exhausted throughout, although he denies pain multiple times. Pt agreeable to lie down after mobility assessment and he falls asleep immediately. Discuessed with RN at end of session. Patient's performance this date reveals decreased ability, independence, and tolerance in performing all basic mobility required for performance of activities of daily living. Pt requires additional DME, close physical assistance, and cues for safe participate in mobility. Pt will benefit from skilled PT intervention to increase independence and safety with basic mobility in preparation for discharge to the venue listed below.      Orthostatic VS for the past 24 hrs (Last 3 readings):  BP- Sitting Pulse- Sitting BP- Standing at 0 minutes Pulse- Standing at 0 minutes  02/06/22 1527 126/75 93 135/72 114   *earlier BG in 50s around 12:00, then 2nd BG improved in 100s ~45 minutes prior to entry.      Recommendations for follow up therapy are one component of a multi-disciplinary discharge planning process, led by the attending physician.  Recommendations may be updated based on patient status, additional functional criteria and insurance authorization.  Follow Up Recommendations Home health PT      Assistance Recommended at Discharge Intermittent Supervision/Assistance  Patient can return home with the following  A little help with walking and/or transfers;A little help with bathing/dressing/bathroom;Assistance with cooking/housework;Assist for transportation;Help with stairs or ramp for entrance    Equipment Recommendations None recommended by PT  Recommendations for Other Services       Functional Status Assessment Patient has had a recent decline in their functional status and demonstrates the ability to make significant improvements in function in a reasonable and predictable amount of time.     Precautions / Restrictions Precautions Precautions: Fall Restrictions Weight Bearing Restrictions: No      Mobility  Bed Mobility   Bed Mobility: Sit to Supine       Sit to supine: Independent        Transfers Overall transfer level: Needs assistance Equipment used: Rolling walker (2 wheels), None Transfers: Sit to/from Stand Sit to Stand: Supervision, Min guard           General transfer comment: multiple false starts, heavy effort for full rise    Ambulation/Gait Ambulation/Gait assistance: Min  guard Gait Distance (Feet): 18 Feet Assistive device: None         General Gait Details: excessive rigid lateral sway, appears unsteady  Stairs            Wheelchair Mobility     Modified Rankin (Stroke Patients Only)       Balance                                             Pertinent Vitals/Pain Pain Assessment Pain Assessment: No/denies pain Pain Score:  (reports that he fell earier today when getting up from table and tripped, hit his elbow on table?, has taken pain meds and feels better now)    Home Living Family/patient expects to be discharged to:: Private residence Living Arrangements: Alone Available Help at Discharge: Available PRN/intermittently;Family Type of Home: Apartment Home Access: Level entry       Home Layout: One level Home Equipment: Rollator (4 wheels);Rolling Walker (2 wheels);Grab bars - tub/shower Additional Comments: Pt has difficulty describing his home set-up and PLOF, but is able to communicate that it is not working well for him at present.    Prior Function Prior Level of Function : Needs assist             Mobility Comments: IND limited community ambulator using no AD, hx of several falls ADLs Comments: Pt reports he has no daily support for ADL; does receive periodic support for IADL such as medication delivery, occasional transport     Hand Dominance   Dominant Hand: Left    Extremity/Trunk Assessment                Communication   Communication: Expressive difficulties  Cognition                                                General Comments      Exercises     Assessment/Plan    PT Assessment Patient needs continued PT services  PT Problem List Decreased strength;Decreased mobility;Decreased activity tolerance;Decreased balance;Decreased knowledge of use of DME       PT Treatment Interventions DME instruction;Therapeutic exercise;Balance training;Gait training;Stair training;Functional mobility training;Therapeutic activities;Patient/family education;Neuromuscular re-education;Cognitive remediation    PT Goals (Current goals can be found in the  Care Plan section)  Acute Rehab PT Goals Patient Stated Goal: feel better when walking, not fall again PT Goal Formulation: With patient Time For Goal Achievement: 02/20/22 Potential to Achieve Goals: Fair    Frequency Min 2X/week     Co-evaluation               AM-PAC PT "6 Clicks" Mobility  Outcome Measure Help needed turning from your back to your side while in a flat bed without using bedrails?: None Help needed moving from lying on your back to sitting on the side of a flat bed without using bedrails?: None Help needed moving to and from a bed to a chair (including a wheelchair)?: A Little Help needed standing up from a chair using your arms (e.g., wheelchair or bedside chair)?: A Little Help needed to walk in hospital room?: A Little Help needed climbing 3-5 steps with a railing? : A Lot 6 Click Score: 19    End of  Session Equipment Utilized During Treatment: Gait belt Activity Tolerance: Patient limited by fatigue;Patient limited by lethargy Patient left: in bed Nurse Communication: Mobility status PT Visit Diagnosis: Unsteadiness on feet (R26.81);Difficulty in walking, not elsewhere classified (R26.2);Muscle weakness (generalized) (M62.81);History of falling (Z91.81)    Time: 1455-1520 PT Time Calculation (min) (ACUTE ONLY): 25 min   Charges:   PT Evaluation $PT Eval Low Complexity: 1 Low         3:52 PM, 02/06/22 Rosamaria Lints, PT, DPT Physical Therapist - [facility]  [phone] (ASCOM)    Geniva Lohnes C 02/06/2022, 3:39 PM

## 2022-02-06 NOTE — Progress Notes (Signed)
Patient is disorganized required multiple redirections. He has been anxious and tearful stated Zyprexa usually helps him.OTO of Zyprexa 5 mg given per Provider orders. Patient provided with front wheel walker for ambulation stated he uses a walker at home. Patient remains safe.  Prn medication effective.   Support and encouragement provided.

## 2022-02-07 DIAGNOSIS — F200 Paranoid schizophrenia: Secondary | ICD-10-CM | POA: Diagnosis not present

## 2022-02-07 LAB — GLUCOSE, CAPILLARY
Glucose-Capillary: 101 mg/dL — ABNORMAL HIGH (ref 70–99)
Glucose-Capillary: 61 mg/dL — ABNORMAL LOW (ref 70–99)
Glucose-Capillary: 84 mg/dL (ref 70–99)
Glucose-Capillary: 68 mg/dL — ABNORMAL LOW (ref 70–99)
Glucose-Capillary: 112 mg/dL — ABNORMAL HIGH (ref 70–99)
Glucose-Capillary: 54 mg/dL — ABNORMAL LOW (ref 70–99)

## 2022-02-07 MED ORDER — OLANZAPINE 5 MG PO TABS: 5.0000 mg | ORAL_TABLET | ORAL | Status: AC | PRN

## 2022-02-07 NOTE — Progress Notes (Signed)
Patient alert and oriented x 3 with periods of confusion to situation , affect is flat but brightens upon approach. 15 minutes safety checks maintained will continue to monitor.

## 2022-02-07 NOTE — Progress Notes (Signed)
[facility] MD Progress Note  02/07/2022 2:13 PM Derrick Atkins  MRN:  [id number] Subjective: Physical therapy came and saw the patient.  He is ambulating better.  He is internally preoccupied and confused at times.  He is not talking to me today.  No evidence of any side effects.  No EPS or TD.  Principal Problem: Schizophrenia, paranoid type (HCC) Diagnosis: Principal Problem:   Schizophrenia, paranoid type (HCC)  Total Time spent with patient: 15 minutes  Past Psychiatric History: Patient is currently being followed by [name].  Recent discharge from psychiatric hospitalization in [place].  Past Medical History:  Past Medical History:  Diagnosis Date   Cerebral palsy (HCC)    Depression    Diabetes mellitus without complication (HCC)    Hypertension    Kidney stones    Schizoaffective disorder (HCC)    Scoliosis     Past Surgical History:  Procedure Laterality Date   BOWEL RESECTION     LAPAROTOMY N/A 05/15/2020   Procedure: EXPLORATORY LAPAROTOMY;  Surgeon: Henrene Dodge, MD;  Location: [facility] ORS;  Service: General;  Laterality: N/A;   Family History:  Family History  Problem Relation Age of Onset   Heart disease Father    Heart attack Father     Social History:  Social History   Substance and Sexual Activity  Alcohol Use No     Social History   Substance and Sexual Activity  Drug Use No    Social History   Socioeconomic History   Marital status: Single    Spouse name: Not on file   Number of children: Not on file   Years of education: Not on file   Highest education level: Not on file  Occupational History   Not on file  Tobacco Use   Smoking status: Never   Smokeless tobacco: Never  Vaping Use   Vaping Use: Never used  Substance and Sexual Activity   Alcohol use: No   Drug use: No   Sexual activity: Not Currently  Other Topics Concern   Not on file  Social History Narrative   Not on file   Social Determinants of Health   Financial  Resource Strain: Not on file  Food Insecurity: Not on file  Transportation Needs: Not on file  Physical Activity: Not on file  Stress: Not on file  Social Connections: Not on file   Additional Social History:                         Sleep: Good  Appetite:  Good  Current Medications: Current Facility-Administered Medications  Medication Dose Route Frequency Provider Last Rate Last Admin   acetaminophen (TYLENOL) tablet 650 mg  650 mg Oral Q6H PRN Charm Rings, NP   650 mg at 02/06/22 1237   alum & mag hydroxide-simeth (MAALOX/MYLANTA) 200-200-20 MG/5ML suspension 30 mL  30 mL Oral Q4H PRN Charm Rings, NP       ARIPiprazole (ABILIFY) tablet 10 mg  10 mg Oral Daily Charm Rings, NP   10 mg at 02/07/22 8416   aspirin EC tablet 81 mg  81 mg Oral Daily Charm Rings, NP   81 mg at 02/07/22 6063   buPROPion (WELLBUTRIN XL) 24 hr tablet 150 mg  150 mg Oral Daily Charm Rings, NP   150 mg at 02/07/22 0160   divalproex (DEPAKOTE) DR tablet 500 mg  500 mg Oral BID Charm Rings, NP  500 mg at 02/07/22 3212   fenofibrate tablet 54 mg  54 mg Oral Daily Charm Rings, NP   54 mg at 02/07/22 2482   gemfibrozil (LOPID) tablet 600 mg  600 mg Oral BID Charm Rings, NP   600 mg at 02/07/22 5003   insulin aspart protamine- aspart (NOVOLOG MIX 70/30) injection 34 Units  34 Units Subcutaneous Q breakfast [name], [name], MD   34 Units at 02/07/22 0824   insulin aspart protamine- aspart (NOVOLOG MIX 70/30) injection 44 Units  44 Units Subcutaneous Q supper Foye Deer, RPH   44 Units at 02/06/22 1632   levothyroxine (SYNTHROID) tablet 88 mcg  88 mcg Oral Daily Charm Rings, NP   88 mcg at 02/07/22 7048   lisinopril (ZESTRIL) tablet 10 mg  10 mg Oral Daily Charm Rings, NP   10 mg at 02/07/22 8891   magnesium hydroxide (MILK OF MAGNESIA) suspension 30 mL  30 mL Oral Daily PRN Charm Rings, NP       metFORMIN (GLUCOPHAGE) tablet 1,000 mg  1,000 mg Oral BID WC Charm Rings, NP   1,000 mg at 02/07/22 0823   ondansetron (ZOFRAN) tablet 4 mg  4 mg Oral Q6H PRN Sarina Ill, DO   4 mg at 02/06/22 1755   pioglitazone (ACTOS) tablet 45 mg  45 mg Oral Daily Charm Rings, NP   45 mg at 02/07/22 6945   simvastatin (ZOCOR) tablet 20 mg  20 mg Oral QHS Charm Rings, NP   20 mg at 02/06/22 2119    Lab Results:  Results for orders placed or performed during the hospital encounter of 02/05/22 (from the past 48 hour(s))  Glucose, capillary     Status: Abnormal   Collection Time: 02/05/22  7:04 PM  Result Value Ref Range   Glucose-Capillary 199 (H) 70 - 99 mg/dL    Comment: Glucose reference range applies only to samples taken after fasting for at least 8 hours.  Glucose, capillary     Status: None   Collection Time: 02/06/22  6:55 AM  Result Value Ref Range   Glucose-Capillary 92 70 - 99 mg/dL    Comment: Glucose reference range applies only to samples taken after fasting for at least 8 hours.  Glucose, capillary     Status: Abnormal   Collection Time: 02/06/22 12:08 PM  Result Value Ref Range   Glucose-Capillary 55 (L) 70 - 99 mg/dL    Comment: Glucose reference range applies only to samples taken after fasting for at least 8 hours.  Glucose, capillary     Status: None   Collection Time: 02/06/22  1:50 PM  Result Value Ref Range   Glucose-Capillary 96 70 - 99 mg/dL    Comment: Glucose reference range applies only to samples taken after fasting for at least 8 hours.  Glucose, capillary     Status: None   Collection Time: 02/06/22  5:33 PM  Result Value Ref Range   Glucose-Capillary 94 70 - 99 mg/dL    Comment: Glucose reference range applies only to samples taken after fasting for at least 8 hours.  Glucose, capillary     Status: Abnormal   Collection Time: 02/07/22  6:44 AM  Result Value Ref Range   Glucose-Capillary 61 (L) 70 - 99 mg/dL    Comment: Glucose reference range applies only to samples taken after fasting for at least 8  hours.  Glucose, capillary  Status: Abnormal   Collection Time: 02/07/22  7:06 AM  Result Value Ref Range   Glucose-Capillary 101 (H) 70 - 99 mg/dL    Comment: Glucose reference range applies only to samples taken after fasting for at least 8 hours.  Glucose, capillary     Status: None   Collection Time: 02/07/22 11:00 AM  Result Value Ref Range   Glucose-Capillary 84 70 - 99 mg/dL    Comment: Glucose reference range applies only to samples taken after fasting for at least 8 hours.    Blood Alcohol level:  Lab Results  Component Value Date   ETH <10 02/02/2022   ETH <10 01/18/2022    Metabolic Disorder Labs: Lab Results  Component Value Date   HGBA1C 5.7 (H) 05/13/2020   MPG 116.89 05/13/2020   No results found for: "PROLACTIN" Lab Results  Component Value Date   TRIG 201 (H) 05/19/2020    Physical Findings: AIMS:  , ,  ,  ,    CIWA:    COWS:     Musculoskeletal: Strength & Muscle Tone: within normal limits Gait & Station: normal Patient leans: N/A  Psychiatric Specialty Exam:  Presentation  General Appearance: Disheveled  Eye Contact:None  Speech:Garbled; Pressured  Speech Volume:Increased  Handedness:Right   Mood and Affect  Mood:Dysphoric; Irritable  Affect:Inappropriate; Full Range   Thought Process  Thought Processes:Disorganized  Descriptions of Associations:Loose  Orientation:Partial  Thought Content:Illogical; Tangential; Scattered; Delusions; Paranoid Ideation  History of Schizophrenia/Schizoaffective disorder:Yes  Duration of Psychotic Symptoms:Greater than six months  Hallucinations:No data recorded Ideas of Reference:Paranoia  Suicidal Thoughts:No data recorded Homicidal Thoughts:No data recorded  Sensorium  Memory:Immediate Poor; Recent Poor; Remote Poor  Judgment:Impaired  Insight:Lacking   Executive Functions  Concentration:Poor  Attention Span:Poor  Recall:Poor  Fund of  Knowledge:Poor  Language:Poor   Psychomotor Activity  Psychomotor Activity:No data recorded  Assets  Assets:Financial Resources/Insurance; Physical Health; Resilience; Social Support   Sleep  Sleep:No data recorded   Physical Exam: Physical Exam ROS Blood pressure (!) 145/80, pulse (!) 113, temperature 98.3 F (36.8 C), temperature source Oral, resp. rate 18, height 6\' 4"  (1.93 m), weight 109.3 kg, SpO2 100 %. Body mass index is 29.34 kg/m.   Treatment Plan Summary: Daily contact with patient to assess and evaluate symptoms and progress in treatment, Medication management, and Plan continue current medications.  , DO 02/07/2022, 2:13 PM

## 2022-02-07 NOTE — [facility] Counselor (Signed)
CSW made 2nd attempt to complete assessment. Pt was confused, incoherent and wandering the unit. Pt unable to complete assessment. CSW will make attempt to complete assessment at another more opportune time.   Latricia Cerrito [place], MSW, LCSW-A 7/30/20231:54 PM

## 2022-02-07 NOTE — Plan of Care (Signed)
Met with pt in medication room. Pt continues to be confused, unaware of situation, and requires frequent redirection. Pt was recently moved to room 25 due to wondering into other patients room. Staff continue to monitor closely and attempt to reorient pt is unit. Pt is adherent with scheduled medication. No signs of distress or injury, and denies SI / HI / AVH at this time. Staff continue to monitor for safety and maintain q15 min safety checks.    Problem: Education: Goal: Knowledge of General Education information will improve Description: Including pain rating scale, medication(s)/side effects and non-pharmacologic comfort measures Outcome: Not Progressing

## 2022-02-07 NOTE — Progress Notes (Signed)
  Hypoglycemic Event  CBG: 68  Treatment: 4 oz juice/soda  Symptoms: None  Follow-up CBG: Time:1630 CBG Result:54 Follow-up CBG: Time:1645 CBG Result: 112  Possible Reasons for Event: Unknown  Comments/MD notified: Marlou Porch DO    Berkley Harvey

## 2022-02-08 DIAGNOSIS — F200 Paranoid schizophrenia: Secondary | ICD-10-CM | POA: Diagnosis not present

## 2022-02-08 LAB — LIPID PANEL
Cholesterol: 98 mg/dL (ref 0–200)
HDL: 44 mg/dL (ref >40)
LDL Cholesterol: 42 mg/dL (ref 0–99)
Total CHOL/HDL Ratio: 2.2 RATIO
Triglycerides: 60 mg/dL (ref <150)
VLDL: 12 mg/dL (ref 0–40)

## 2022-02-08 LAB — VALPROIC ACID LEVEL: Valproic Acid Lvl: 66 ug/mL (ref 50.0–100.0)

## 2022-02-08 LAB — HEMOGLOBIN A1C
Hgb A1c MFr Bld: 7 % — ABNORMAL HIGH (ref 4.8–5.6)
Mean Plasma Glucose: 154.2 mg/dL

## 2022-02-08 LAB — GLUCOSE, CAPILLARY
Glucose-Capillary: 82 mg/dL (ref 70–99)
Glucose-Capillary: 83 mg/dL (ref 70–99)

## 2022-02-08 MED ORDER — ARIPIPRAZOLE 10 MG PO TABS
20.0000 mg | ORAL_TABLET | Freq: Every day | ORAL | Status: DC
  Administered 2022-02-08 – 2022-02-10 (×3): 20 mg via ORAL
  Filled 2022-02-08 (×3): qty 2

## 2022-02-08 MED ORDER — INSULIN ASPART 100 UNIT/ML IJ SOLN
0.0000 [IU] | Freq: Three times a day (TID) | INTRAMUSCULAR | Status: DC
  Administered 2022-02-09 – 2022-02-10 (×2): 2 [IU] via SUBCUTANEOUS
  Administered 2022-02-13: 3 [IU] via SUBCUTANEOUS
  Administered 2022-02-17 – 2022-02-22 (×3): 2 [IU] via SUBCUTANEOUS
  Administered 2022-02-22: 3 [IU] via SUBCUTANEOUS
  Administered 2022-02-22 – 2022-02-24 (×4): 2 [IU] via SUBCUTANEOUS
  Filled 2022-02-08 (×11): qty 1

## 2022-02-08 NOTE — [facility] Counselor (Signed)
Adult Comprehensive Assessment  Patient ID: Derrick Atkins, male   DOB: [birth year], 51 y.o.   MRN: [id number]  Information Source: Information source:  (Completed with patient's guardian, Derrick Atkins [phone])  Current Stressors:  Patient states their primary concerns and needs for treatment are:: "went off the walls, mumbling, screaming at neighbors, cursing" Patient states their goals for this hospitilization and ongoing recovery are:: "someone is going to have to deal with his psychological problems, long-term he's going to need a more structured living environment" Educational / Learning stressors: Unable to assess. Employment / Job issues: Unable to assess. Family Relationships: Unable to assess. Financial / Lack of resources (include bankruptcy): Unable to assess. Housing / Lack of housing: "he needs something more structured" Physical health (include injuries & life threatening diseases): "diabetic" Social relationships: Unable to assess. Substance abuse: Unable to assess. Bereavement / Loss: Unable to assess.  Living/Environment/Situation:  Living Arrangements: Alone Living conditions (as described by patient or guardian): Guardian reports that the patient lives in his own apartment.  Guardian unsure if patient can return to apartment. How long has patient lived in current situation?: "14 years"  Family History:  Marital status: Single Does patient have children?: No  Childhood History:  By whom was/is the patient raised?: Both parents Description of patient's relationship with caregiver when they were a child: "fairly good with father, his mom played up his cerebral palsy" Patient's description of current relationship with people who raised him/her: Parents are deceased. 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?: No  Financial Resources:   Financial resources: Medicare, Medicaid Does patient have a Lawyer or guardian?: Yes Name of representative payee or guardian: Derrick Atkins, family, [phone]; Pt has a payee, guardian is unsure of who  Alcohol/Substance Abuse:   What has been your use of drugs/alcohol within the last 12 months?: Unable to assess.  Social Support System:   Describe Community Support System: Unable to assess. Type of faith/religion: Unable to assess. How does patient's faith help to cope with current illness?: Unable to assess.  Leisure/Recreation:   Do You Have Hobbies?: Yes Leisure and Hobbies: "Video games, he will spend 20 of 14 hours playing games"  Strengths/Needs:   What is the patient's perception of their strengths?: "he can be a very nice friendly, outgoing person"  Discharge Plan:   Currently receiving community mental health services: Yes (From Whom) Derrick Atkins) Patient states concerns and preferences for aftercare planning are: Guardian reports frustration with [employer]. Patient states they will know when they are safe and ready for discharge when: "I have no earthly idea, I am not a psychiatrist" Does patient have access to transportation?: Yes (Guardian) Does patient have financial barriers related to discharge medications?: No Will patient be returning to same living situation after discharge?: Yes  Summary/Recommendations:   Summary and Recommendations (to be completed by the evaluator): Patient is a 51 year old male [place] [facility] [facility]  Patient has a legal guardian,  Derrick Atkins, family, [phone].  Patient unable or unwilling to complete PSA.  Information gathered from guardian and chart review.  CSW attempted to complete the assessment with the patient, however, the patient kept falling asleep. Patient reported at admission that he has not been taking his medication for the past month.  Patient reports that he has not been sleeping or eating well.   Guardian reports concerns that the patient needs long term hospitalization, "he spent a year and a half at [facility]".  She reports concerns that the patient can not care for himself on his own and thinks that the patient needs "more than assisted living".  Recommendations include: crisis stabilization, therapeutic milieu, encourage group attendance and participation, medication management for mood stabilization and development of comprehensive mental wellness plan  Derrick Atkins. 02/08/2022

## 2022-02-08 NOTE — Progress Notes (Signed)
   02/08/22 0000  Psych Admission Type (Psych Patients Only)  Admission Status Involuntary  Psychosocial Assessment  Patient Complaints Anxiety  Eye Contact Brief  Facial Expression Flat  Affect Blunted  Speech Soft  Interaction Cautious  Motor Activity Slow  Appearance/Hygiene In scrubs  Behavior Characteristics Calm  Mood Anxious  Aggressive Behavior  Effect No apparent injury  Thought Process  Coherency WDL  Content WDL  Delusions None reported or observed  Perception WDL  Hallucination None reported or observed  Judgment Poor  Confusion Moderate  Danger to Others  Danger to Others None reported or observed

## 2022-02-08 NOTE — BH IP Treatment Plan (Signed)
Interdisciplinary Treatment and Diagnostic Plan Update  02/08/2022 Time of Session: 9:30AM Derrick Atkins MRN: [id number]  Principal Diagnosis: Schizophrenia, paranoid type [facility])  Secondary Diagnoses: Principal Problem:   Schizophrenia, paranoid type ([place])   Current Medications:  Current Facility-Administered Medications  Medication Dose Route Frequency Provider Last Rate Last Admin   acetaminophen (TYLENOL) tablet 650 mg  650 mg Oral Q6H PRN Patrecia Pour, NP   650 mg at 02/06/22 1237   alum & mag hydroxide-simeth (MAALOX/MYLANTA) 200-200-20 MG/5ML suspension 30 mL  30 mL Oral Q4H PRN Patrecia Pour, NP       aspirin EC tablet 81 mg  81 mg Oral Daily Patrecia Pour, NP   81 mg at 02/08/22 9833   fenofibrate tablet 54 mg  54 mg Oral Daily Patrecia Pour, NP   54 mg at 02/08/22 8250   gemfibrozil (LOPID) tablet 600 mg  600 mg Oral BID Patrecia Pour, NP   600 mg at 02/08/22 5397   levothyroxine (SYNTHROID) tablet 88 mcg  88 mcg Oral Daily Patrecia Pour, NP   88 mcg at 02/08/22 0820   magnesium hydroxide (MILK OF MAGNESIA) suspension 30 mL  30 mL Oral Daily PRN Patrecia Pour, NP       metFORMIN (GLUCOPHAGE) tablet 1,000 mg  1,000 mg Oral BID WC Patrecia Pour, NP   1,000 mg at 02/08/22 0820   OLANZapine (ZYPREXA) tablet 5 mg  5 mg Oral Q4H PRN Parks Ranger, DO       ondansetron [facility]) tablet 4 mg  4 mg Oral Q6H PRN Parks Ranger, DO   4 mg at 02/06/22 1755   pioglitazone (ACTOS) tablet 45 mg  45 mg Oral Daily Patrecia Pour, NP   45 mg at 02/08/22 0820   simvastatin (ZOCOR) tablet 20 mg  20 mg Oral QHS Patrecia Pour, NP   20 mg at 02/07/22 2103   PTA Medications: Medications Prior to Admission  Medication Sig Dispense Refill Last Dose   ARIPiprazole (ABILIFY) 10 MG tablet Take 10 mg by mouth daily.      ARIPiprazole (ABILIFY) 20 MG tablet Take 1 tablet by mouth daily.  (Patient not taking: Reported on 02/02/2022)      aspirin 81 MG EC tablet Take 1  tablet by mouth daily.      buPROPion (WELLBUTRIN XL) 300 MG 24 hr tablet Take 300 mg by mouth daily. (Patient not taking: Reported on 02/02/2022)      buPROPion (WELLBUTRIN) 75 MG tablet Take 75 mg by mouth 2 (two) times daily.      cyclobenzaprine (FLEXERIL) 5 MG tablet Take 1 tablet (5 mg total) by mouth at bedtime. (Patient not taking: Reported on 01/18/2022) 30 tablet 0    divalproex (DEPAKOTE ER) 500 MG 24 hr tablet Take 1,000 mg by mouth at bedtime. (Patient not taking: Reported on 02/02/2022)      divalproex (DEPAKOTE) 500 MG DR tablet Take 500 mg by mouth 2 (two) times daily.      gemfibrozil (LOPID) 600 MG tablet Take 600 mg by mouth 2 (two) times daily.      Insulin Lispro Prot & Lispro (HUMALOG 75/25 MIX) (75-25) 100 UNIT/ML Kwikpen Inject into the skin.      levothyroxine (SYNTHROID, LEVOTHROID) 88 MCG tablet Take 88 mcg by mouth daily.      lidocaine (LIDODERM) 5 % Place 1 patch onto the skin every 12 (twelve) hours. Remove & Discard patch within 12  hours or as directed by MD (Patient not taking: Reported on 01/18/2022) 15 patch 0    lisinopril (PRINIVIL,ZESTRIL) 10 MG tablet Take 1 tablet by mouth daily.      metFORMIN (GLUCOPHAGE) 1000 MG tablet Take 1,000 mg by mouth 2 (two) times daily with a meal.      metoprolol succinate (TOPROL-XL) 50 MG 24 hr tablet Take 50 mg by mouth daily. (Patient not taking: Reported on 02/02/2022)      naproxen (NAPROSYN) 500 MG tablet Take 1 tablet (500 mg total) by mouth 2 (two) times daily with a meal. (Patient not taking: Reported on 01/18/2022) 20 tablet 0    NOVOLOG MIX 70/30 FLEXPEN (70-30) 100 UNIT/ML FlexPen Inject 34-44 Units into the skin 2 (two) times daily. 34 units-am 44units-pm      pioglitazone (ACTOS) 45 MG tablet Take 45 mg by mouth daily.      polyethylene glycol (MIRALAX / GLYCOLAX) 17 g packet Take 17 g by mouth daily. (Patient not taking: Reported on 08/30/2021) 14 each 0    simvastatin (ZOCOR) 20 MG tablet Take 20 mg by mouth at bedtime.        Patient Stressors: Financial difficulties   Health problems    Patient Strengths: Motivation for treatment/growth   Treatment Modalities: Medication Management, Group therapy, Case management,  1 to 1 session with clinician, Psychoeducation, Recreational therapy.   Physician Treatment Plan for Primary Diagnosis: Schizophrenia, paranoid type ([place]) Long Term Goal(s): Improvement in symptoms so as ready for discharge   Short Term Goals: Ability to identify changes in lifestyle to reduce recurrence of condition will improve Ability to verbalize feelings will improve Ability to disclose and discuss suicidal ideas Ability to demonstrate self-control will improve Ability to identify and develop effective coping behaviors will improve Ability to maintain clinical measurements within normal limits will improve Compliance with prescribed medications will improve Ability to identify triggers associated with substance abuse/mental health issues will improve  Medication Management: Evaluate patient's response, side effects, and tolerance of medication regimen.  Therapeutic Interventions: 1 to 1 sessions, Unit Group sessions and Medication administration.  Evaluation of Outcomes: Not Met  Physician Treatment Plan for Secondary Diagnosis: Principal Problem:   Schizophrenia, paranoid type ([place])  Long Term Goal(s): Improvement in symptoms so as ready for discharge   Short Term Goals: Ability to identify changes in lifestyle to reduce recurrence of condition will improve Ability to verbalize feelings will improve Ability to disclose and discuss suicidal ideas Ability to demonstrate self-control will improve Ability to identify and develop effective coping behaviors will improve Ability to maintain clinical measurements within normal limits will improve Compliance with prescribed medications will improve Ability to identify triggers associated with substance abuse/mental health issues  will improve     Medication Management: Evaluate patient's response, side effects, and tolerance of medication regimen.  Therapeutic Interventions: 1 to 1 sessions, Unit Group sessions and Medication administration.  Evaluation of Outcomes: Not Met   RN Treatment Plan for Primary Diagnosis: Schizophrenia, paranoid type ([place]) Long Term Goal(s): Knowledge of disease and therapeutic regimen to maintain health will improve  Short Term Goals: Ability to remain free from injury will improve, Ability to verbalize frustration and anger appropriately will improve, Ability to demonstrate self-control, Ability to participate in decision making will improve, Ability to verbalize feelings will improve, Ability to identify and develop effective coping behaviors will improve, and Compliance with prescribed medications will improve  Medication Management: RN will administer medications as ordered by provider, will assess and  evaluate patient's response and provide education to patient for prescribed medication. RN will report any adverse and/or side effects to prescribing provider.  Therapeutic Interventions: 1 on 1 counseling sessions, Psychoeducation, Medication administration, Evaluate responses to treatment, Monitor vital signs and CBGs as ordered, Perform/monitor CIWA, COWS, AIMS and Fall Risk screenings as ordered, Perform wound care treatments as ordered.  Evaluation of Outcomes: Not Met   LCSW Treatment Plan for Primary Diagnosis: Schizophrenia, paranoid type ([place]) Long Term Goal(s): Safe transition to appropriate next level of care at discharge, Engage patient in therapeutic group addressing interpersonal concerns.  Short Term Goals: Engage patient in aftercare planning with referrals and resources, Increase social support, Increase ability to appropriately verbalize feelings, Increase emotional regulation, Facilitate acceptance of mental health diagnosis and concerns, and Increase skills for  wellness and recovery  Therapeutic Interventions: Assess for all discharge needs, 1 to 1 time with Social worker, Explore available resources and support systems, Assess for adequacy in community support network, Educate family and significant other(s) on suicide prevention, Complete Psychosocial Assessment, Interpersonal group therapy.  Evaluation of Outcomes: Not Met   Progress in Treatment: Attending groups: No. Participating in groups: No. Taking medication as prescribed: Yes. Toleration medication: Yes. Family/Significant other contact made: No, will contact:  when given permission Patient understands diagnosis: No. Discussing patient identified problems/goals with staff: No. Medical problems stabilized or resolved: Yes. Denies suicidal/homicidal ideation: No. Issues/concerns per patient self-inventory: No. Other: None  New problem(s) identified: No, Describe:  None  New Short Term/Long Term Goal(s): Patient to work towards elimination of symptoms of psychosis, medication management for mood stabilization; development of comprehensive mental wellness plan.   Patient Goals:  Patient was unable to provide goals during treatment team.  Discharge Plan or Barriers:  CSW will assist pt with development of appropriate discharge/aftercare plan.   Reason for Continuation of Hospitalization: Delusions  Hallucinations Mania Medication stabilization  Estimated Length of Stay:  TBD  Last 3 [place] Suicide Severity Risk Score: Flowsheet Row Admission (Current) from 02/05/2022 in [place] ED from 02/02/2022 in [place] [facility] from 01/18/2022 in [place] No Risk No Risk No Risk       Last PHQ 2/9 Scores:     No data to display          Scribe for Treatment Team: Rosa Wyly A [place], [place] 02/08/2022 11:23 AM

## 2022-02-08 NOTE — Progress Notes (Signed)
Pt presents with thought blocking and is minimal with staff and peers. Pt given support. Pt observed interacting appropriately with staff and peers on the unit. Pt compliant with medication administration per MD orders. Pt being monitored Q 15 minutes for safety per unit protocol. Pt remains safe on the unit.

## 2022-02-08 NOTE — Plan of Care (Signed)
  Problem: Education: Goal: Knowledge of  General Education information/materials will improve Outcome: Progressing   Problem: Health Behavior/Discharge Planning: Goal: Compliance with treatment plan for underlying cause of condition will improve Outcome: Progressing   Problem: Safety: Goal: Periods of time without injury will increase Outcome: Progressing   

## 2022-02-08 NOTE — Progress Notes (Signed)
D: Patient alert and oriented. Patient denies pain. Patient denies anxiety and depression. Patient denies SI/HI/AVH. Patient has been isolative to self and room during shift. Redirection is needed with patient to bring him back to his room. Patient would wander down a different hall of patient rooms but would turn around after being redirected.  Patient did go outside to the courtyard when patients were given the opportunity after dinner.   A: Scheduled medications administered to patient, per MD orders.  Support and encouragement provided to patient.  Q15 minute safety checks maintained.   R: Patient compliant with medication administration and treatment plan. No adverse drug reactions noted. Patient remains safe on the unit at this time.

## 2022-02-08 NOTE — [facility] Suicide Risk Assessment (Signed)
[facility] INPATIENT:  Family/Significant Other Suicide Prevention Education  Suicide Prevention Education:  Education Completed; [name], guardian, [phone],  (name of family member/significant other) has been identified by the patient as the family member/significant other with whom the patient will be residing, and identified as the person(s) who will aid the patient in the event of a mental health crisis (suicidal ideations/suicide attempt).  With written consent from the patient, the family member/significant other has been provided the following suicide prevention education, prior to the and/or following the discharge of the patient.  The suicide prevention education provided includes the following: Suicide risk factors Suicide prevention and interventions National Suicide Hotline telephone number [facility] assessment telephone number [facility] Emergency Assistance 911 [facility] and/or Residential Mobile Crisis Unit telephone number  Request made of family/significant other to: Remove weapons (e.g., guns, rifles, knives), all items previously/currently identified as safety concern.   Remove drugs/medications (over-the-counter, prescriptions, illicit drugs), all items previously/currently identified as a safety concern.  The family member/significant other verbalizes understanding of the suicide prevention education information provided.  The family member/significant other agrees to remove the items of safety concern listed above.  Guardian reports that the patient needs long-term housing.   Derrick Atkins 02/08/2022, 3:08 PM

## 2022-02-08 NOTE — Progress Notes (Signed)
[facility] MD Progress Note  02/08/2022 2:31 PM Derrick Atkins  MRN:  [id number] Subjective: Follow-up for a 51 year old man with a history of schizophrenia.  Patient seen and chart reviewed.  He was brought to our emergency room after outpatient providers found him to be grossly decompensating with paranoid delusional thinking.  On interview today the patient refused to speak at all during treatment team meeting.  In individual meeting he spoke only a few words and would not answer most questions and would not answer any open-ended questions.  He would tell me his address but could not tell me much else about his life.  Staring off into space seems to be responding to internal thoughts.  Spoke with his guardian who is a family member.  She confirmed that despite his recent top of hospitalization at [facility] he has continued to decline with more bizarre behavior disorganized behavior and thinking and poor self-care.  Spoke briefly with the ACT team who confirmed that he has been going downhill and more uncooperative with treatment. Principal Problem: Schizophrenia, paranoid type (HCC) Diagnosis: Principal Problem:   Schizophrenia, paranoid type (HCC)  Total Time spent with patient: 30 minutes  Past Psychiatric History: Past history of schizophrenia and multiple hospitalizations including lengthy state hospital stays.  Poor functioning when living independently  Past Medical History:  Past Medical History:  Diagnosis Date   Cerebral palsy (HCC)    Depression    Diabetes mellitus without complication (HCC)    Hypertension    Kidney stones    Schizoaffective disorder (HCC)    Scoliosis     Past Surgical History:  Procedure Laterality Date   BOWEL RESECTION     LAPAROTOMY N/A 05/15/2020   Procedure: EXPLORATORY LAPAROTOMY;  Surgeon: Henrene Dodge, MD;  Location: [facility] ORS;  Service: General;  Laterality: N/A;   Family History:  Family History  Problem Relation Age of Onset   Heart  disease Father    Heart attack Father    Family Psychiatric  History: See previous Social History:  Social History   Substance and Sexual Activity  Alcohol Use No     Social History   Substance and Sexual Activity  Drug Use No    Social History   Socioeconomic History   Marital status: Single    Spouse name: Not on file   Number of children: Not on file   Years of education: Not on file   Highest education level: Not on file  Occupational History   Not on file  Tobacco Use   Smoking status: Never   Smokeless tobacco: Never  Vaping Use   Vaping Use: Never used  Substance and Sexual Activity   Alcohol use: No   Drug use: No   Sexual activity: Not Currently  Other Topics Concern   Not on file  Social History Narrative   Not on file   Social Determinants of Health   Financial Resource Strain: Not on file  Food Insecurity: Not on file  Transportation Needs: Not on file  Physical Activity: Not on file  Stress: Not on file  Social Connections: Not on file   Additional Social History:                         Sleep: Fair  Appetite:  Negative  Current Medications: Current Facility-Administered Medications  Medication Dose Route Frequency Provider Last Rate Last Admin   acetaminophen (TYLENOL) tablet 650 mg  650 mg Oral Q6H PRN  Charm Rings, NP   650 mg at 02/06/22 1237   alum & mag hydroxide-simeth (MAALOX/MYLANTA) 200-200-20 MG/5ML suspension 30 mL  30 mL Oral Q4H PRN Charm Rings, NP       ARIPiprazole (ABILIFY) tablet 20 mg  20 mg Oral Daily Canna Nickelson T, MD   20 mg at 02/08/22 1408   aspirin EC tablet 81 mg  81 mg Oral Daily Charm Rings, NP   81 mg at 02/08/22 4098   fenofibrate tablet 54 mg  54 mg Oral Daily Charm Rings, NP   54 mg at 02/08/22 1191   gemfibrozil (LOPID) tablet 600 mg  600 mg Oral BID Charm Rings, NP   600 mg at 02/08/22 4782   insulin aspart (novoLOG) injection 0-15 Units  0-15 Units Subcutaneous TID WC  Adaijah Endres, [name], MD       levothyroxine (SYNTHROID) tablet 88 mcg  88 mcg Oral Daily Charm Rings, NP   88 mcg at 02/08/22 0820   magnesium hydroxide (MILK OF MAGNESIA) suspension 30 mL  30 mL Oral Daily PRN Charm Rings, NP       metFORMIN (GLUCOPHAGE) tablet 1,000 mg  1,000 mg Oral BID WC Charm Rings, NP   1,000 mg at 02/08/22 0820   OLANZapine (ZYPREXA) tablet 5 mg  5 mg Oral Q4H PRN Sarina Ill, DO       ondansetron [facility]) tablet 4 mg  4 mg Oral Q6H PRN Sarina Ill, DO   4 mg at 02/06/22 1755   pioglitazone (ACTOS) tablet 45 mg  45 mg Oral Daily Charm Rings, NP   45 mg at 02/08/22 0820   simvastatin (ZOCOR) tablet 20 mg  20 mg Oral QHS Charm Rings, NP   20 mg at 02/07/22 2103    Lab Results:  Results for orders placed or performed during the hospital encounter of 02/05/22 (from the past 48 hour(s))  Glucose, capillary     Status: None   Collection Time: 02/06/22  5:33 PM  Result Value Ref Range   Glucose-Capillary 94 70 - 99 mg/dL    Comment: Glucose reference range applies only to samples taken after fasting for at least 8 hours.  Glucose, capillary     Status: Abnormal   Collection Time: 02/07/22  6:44 AM  Result Value Ref Range   Glucose-Capillary 61 (L) 70 - 99 mg/dL    Comment: Glucose reference range applies only to samples taken after fasting for at least 8 hours.  Glucose, capillary     Status: Abnormal   Collection Time: 02/07/22  7:06 AM  Result Value Ref Range   Glucose-Capillary 101 (H) 70 - 99 mg/dL    Comment: Glucose reference range applies only to samples taken after fasting for at least 8 hours.  Glucose, capillary     Status: None   Collection Time: 02/07/22 11:00 AM  Result Value Ref Range   Glucose-Capillary 84 70 - 99 mg/dL    Comment: Glucose reference range applies only to samples taken after fasting for at least 8 hours.  Glucose, capillary     Status: Abnormal   Collection Time: 02/07/22  4:15 PM  Result Value  Ref Range   Glucose-Capillary 68 (L) 70 - 99 mg/dL    Comment: Glucose reference range applies only to samples taken after fasting for at least 8 hours.  Glucose, capillary     Status: Abnormal   Collection Time: 02/07/22  4:32  PM  Result Value Ref Range   Glucose-Capillary 54 (L) 70 - 99 mg/dL    Comment: Glucose reference range applies only to samples taken after fasting for at least 8 hours.  Glucose, capillary     Status: Abnormal   Collection Time: 02/07/22  4:46 PM  Result Value Ref Range   Glucose-Capillary 112 (H) 70 - 99 mg/dL    Comment: Glucose reference range applies only to samples taken after fasting for at least 8 hours.  Valproic acid level     Status: None   Collection Time: 02/08/22  7:10 AM  Result Value Ref Range   Valproic Acid Lvl 66 50.0 - 100.0 ug/mL    Comment: Performed at [facility], [address] [place], [place] [address]  Lipid panel     Status: None   Collection Time: 02/08/22  1:37 PM  Result Value Ref Range   Cholesterol 98 0 - 200 mg/dL   Triglycerides 60 <272 mg/dL   HDL 44 >53 mg/dL   Total CHOL/HDL Ratio 2.2 RATIO   VLDL 12 0 - 40 mg/dL   LDL Cholesterol 42 0 - 99 mg/dL    Comment:        Total Cholesterol/HDL:CHD Risk Coronary Heart Disease Risk Table                     Men   Women  1/2 Average Risk   3.4   3.3  Average Risk       5.0   4.4  2 X Average Risk   9.6   7.1  3 X Average Risk  23.4   11.0        Use the calculated Patient Ratio above and the CHD Risk Table to determine the patient's CHD Risk.        ATP III CLASSIFICATION (LDL):  <100     mg/dL   Optimal  664-403  mg/dL   Near or Above                    Optimal  130-159  mg/dL   Borderline  474-259  mg/dL   High  >563     mg/dL   Very High Performed at [facility], [address] [place], [place] [address]     Blood Alcohol level:  Lab Results  Component Value Date   [facility] <10 02/02/2022   ETH <10 01/18/2022    Metabolic Disorder  Labs: Lab Results  Component Value Date   HGBA1C 5.7 (H) 05/13/2020   MPG 116.89 05/13/2020   No results found for: "PROLACTIN" Lab Results  Component Value Date   CHOL 98 02/08/2022   TRIG 60 02/08/2022   HDL 44 02/08/2022   CHOLHDL 2.2 02/08/2022   VLDL 12 02/08/2022   LDLCALC 42 02/08/2022    Physical Findings: AIMS:  , ,  ,  ,    CIWA:    COWS:     Musculoskeletal: Strength & Muscle Tone: within normal limits Gait & Station: normal Patient leans: N/A  Psychiatric Specialty Exam:  Presentation  General Appearance: Disheveled  Eye Contact:None  Speech:Garbled; Pressured  Speech Volume:Increased  Handedness:Right   Mood and Affect  Mood:Dysphoric; Irritable  Affect:Inappropriate; Full Range   Thought Process  Thought Processes:Disorganized  Descriptions of Associations:Loose  Orientation:Partial  Thought Content:Illogical; Tangential; Scattered; Delusions; Paranoid Ideation  History of Schizophrenia/Schizoaffective disorder:Yes  Duration of Psychotic Symptoms:Greater than six months  Hallucinations:No data recorded Ideas  of Reference:Paranoia  Suicidal Thoughts:No data recorded Homicidal Thoughts:No data recorded  Sensorium  Memory:Immediate Poor; Recent Poor; Remote Poor  Judgment:Impaired  Insight:Lacking   Executive Functions  Concentration:Poor  Attention Span:Poor  Recall:Poor  Fund of Knowledge:Poor  Language:Poor   Psychomotor Activity  Psychomotor Activity:No data recorded  Assets  Assets:Financial Resources/Insurance; Physical Health; Resilience; Social Support   Sleep  Sleep:No data recorded   Physical Exam: Physical Exam Vitals and nursing note reviewed.  Constitutional:      Appearance: Normal appearance.  HENT:     Head: Normocephalic and atraumatic.     Mouth/Throat:     Pharynx: Oropharynx is clear.  Eyes:     Pupils: Pupils are equal, round, and reactive to light.  Cardiovascular:     Rate  and Rhythm: Normal rate and regular rhythm.  Pulmonary:     Effort: Pulmonary effort is normal.     Breath sounds: Normal breath sounds.  Abdominal:     General: Abdomen is flat.     Palpations: Abdomen is soft.  Musculoskeletal:        General: Normal range of motion.  Skin:    General: Skin is warm and dry.  Neurological:     General: No focal deficit present.     Mental Status: He is alert. Mental status is at baseline.  Psychiatric:        Attention and Perception: He is inattentive.        Mood and Affect: Mood normal. Affect is blunt and inappropriate.        Speech: He is noncommunicative.        Behavior: Behavior is withdrawn.        Cognition and Memory: Cognition is impaired. Memory is impaired.        Judgment: Judgment is inappropriate.    Review of Systems  Unable to perform ROS: Psychiatric disorder   Blood pressure 140/77, pulse 98, temperature (!) 97.5 F (36.4 C), temperature source Oral, resp. rate 18, height 6\' 4"  (1.93 m), weight 109.3 kg, SpO2 100 %. Body mass index is 29.34 kg/m.   Treatment Plan Summary: Medication management and Plan patient is psychotic and disorganized and has engaged in inappropriate behavior within the last day including walking into other patient's rooms and taking their clothing even putting on the clothing of male patients.  Very disruptive.  Clearly not going to improve without medicine.  Orders placed to restart Abilify starting at 20 mg a day.  After speaking with his guardian it seems clear that the patient should be living in a supervised facility because of his inability to care for himself as well as his serious diabetes.  In the matter of the diabetes I see that he had been on large doses of insulin which have now been discontinued.  I am going to restart sliding scale and then we will see what his sugars are over the next day or so before restarting standing insulin doses.  , MD 02/08/2022, 2:31 PM

## 2022-02-09 DIAGNOSIS — F200 Paranoid schizophrenia: Secondary | ICD-10-CM | POA: Diagnosis not present

## 2022-02-09 LAB — GLUCOSE, CAPILLARY
Glucose-Capillary: 130 mg/dL — ABNORMAL HIGH (ref 70–99)
Glucose-Capillary: 99 mg/dL (ref 70–99)
Glucose-Capillary: 110 mg/dL — ABNORMAL HIGH (ref 70–99)
Glucose-Capillary: 120 mg/dL — ABNORMAL HIGH (ref 70–99)

## 2022-02-09 MED ORDER — HYDROXYZINE HCL 50 MG PO TABS
50.0000 mg | ORAL_TABLET | Freq: Three times a day (TID) | ORAL | Status: DC | PRN
Start: 2022-02-09 — End: 2022-02-24
  Administered 2022-02-09 – 2022-02-24 (×9): 50 mg via ORAL
  Filled 2022-02-09 (×9): qty 1

## 2022-02-09 MED ORDER — TEMAZEPAM 15 MG PO CAPS
15.0000 mg | ORAL_CAPSULE | Freq: Every evening | ORAL | Status: DC | PRN
  Administered 2022-02-09 – 2022-02-19 (×9): 15 mg via ORAL
  Filled 2022-02-09 (×13): qty 1

## 2022-02-09 MED ORDER — CLONAZEPAM 0.5 MG PO TBDP
0.5000 mg | ORAL_TABLET | Freq: Two times a day (BID) | ORAL | Status: DC | PRN
  Administered 2022-02-09 – 2022-02-24 (×16): 0.5 mg via ORAL
  Filled 2022-02-09 (×16): qty 1

## 2022-02-09 NOTE — Progress Notes (Signed)
D: Patient alert and oriented. Patient denies pain. Patient denies anxiety and depression. Patient denies SI/HI/AVH at time of assessment. Patient has remained in the hallway for majority of shift.  Around 1400 patient endorsed auditory hallucinations stating that he is hearing voices. He explains that he recently went through a twisted bowel surgery and that is the reason he has a abdominal scar and why he is in the hospital. He explains that he is hearing voices and "they are saying I am going to stay here forever and never get out of here." Patient endorsed high anxiety. PRN clonazepam  given to patient for anxiety.  Patient is paranoid having a room next to the exam room where the oxygen is stored stating how is he supposed to breath when the oxygen is stored in the next room.  Patient continues to walk around the halls of the unit.   A: Scheduled medications administered to patient, per MD orders.  Support and encouragement provided to patient.  Q15 minute safety checks maintained.   R: Patient compliant with medication administration and treatment plan. No adverse drug reactions noted. Patient remains safe on the unit at this time.

## 2022-02-09 NOTE — Progress Notes (Signed)
Pt presents with a better affect tonight compared to last night when this writer had him. Pt more verbal tonight expressing his concerns about medications and his anxiety. Pt given education and support. Pt observed interacting appropriately with staff and peers on the unit. Pt compliant with medication administration per MD orders. Pt being monitored Q 15 minutes for safety per unit protocol, remains safe on the unit.

## 2022-02-09 NOTE — Progress Notes (Signed)
Physical Therapy Treatment Patient Details Name: Derrick Atkins MRN: [id number] DOB: Apr 17, 1971 Today's Date: 02/09/2022   History of Present Illness Derrick Atkins is a 50yoM who comes to [facility] on 02/02/22 via [facility] under [facility]C with facility reports of decompensating since DC 7/21. PMH: cerebral palsy, HTN, DM, LEE, and schizoaffective disorder. Pt reports he tripped day of evaluation, feet got tangled up while trying to rise from table and he fell and hit his right elbow, says it feels ok right now after getting some pain medication earlier.    PT Comments    Pt received up in hallway, meandering the unit without a true destination. Pt appears more alert today compared to 3 days prior, has improved postural extension and upright standing tolerance. Pt is not using RW, but is AMB without any frank imbalance. Pt agreeable to AMB with author up and down hall for assessment, does nto appear to tolerate especially well, requires 1 standing recovery interval for ~ 30 sec. Pt tachycardic throughout, similar to other vitals assessments on unit. Pt really struggles to communicate, combination of barriers, hypophonia, tangential process, and does not repeat self when asked. Pt at two points beings to cry and hold chest quite emotionally, but these are brief, less than 30sec, he mentions something about brother, but does not clarify. It is difficult to communicate effectively with patient. Pt appears to be AMB much better, no device needed. Pt denies any falls since 3 days ago. Will continue to follow.   Recommendations for follow up therapy are one component of a multi-disciplinary discharge planning process, led by the attending physician.  Recommendations may be updated based on patient status, additional functional criteria and insurance authorization.  Follow Up Recommendations  Home health PT     Assistance Recommended at Discharge Intermittent Supervision/Assistance  Patient can return home with the  following A little help with walking and/or transfers;A little help with bathing/dressing/bathroom;Assistance with cooking/housework;Assist for transportation;Help with stairs or ramp for entrance   Equipment Recommendations  None recommended by PT    Recommendations for Other Services       Precautions / Restrictions Precautions Precautions: Fall Restrictions Weight Bearing Restrictions: No     Mobility  Bed Mobility                    Transfers                        Ambulation/Gait Ambulation/Gait assistance: Modified independent (Device/Increase time) Gait Distance (Feet): 300 Feet Assistive device: None         General Gait Details: improved postural extension, preferencial LLE dominant stance and gait. Appears to need a rest break halway through, but cannot verbalize his response to exercise   Stairs             Wheelchair Mobility    Modified Rankin (Stroke Patients Only)       Balance Overall balance assessment: Modified Independent                                          Cognition Arousal/Alertness: Awake/alert Behavior During Therapy: WFL for tasks assessed/performed Overall Cognitive Status: Within Functional Limits for tasks assessed  Exercises      General Comments        Pertinent Vitals/Pain Pain Assessment Pain Assessment: No/denies pain    Home Living                          Prior Function            PT Goals (current goals can now be found in the care plan section) Acute Rehab PT Goals Patient Stated Goal: feel better when walking, not fall again PT Goal Formulation: With patient Time For Goal Achievement: 02/20/22 Potential to Achieve Goals: Fair Progress towards PT goals: Progressing toward goals    Frequency    Min 2X/week      PT Plan Current plan remains appropriate    Co-evaluation               AM-PAC PT "6 Clicks" Mobility   Outcome Measure  Help needed turning from your back to your side while in a flat bed without using bedrails?: None Help needed moving from lying on your back to sitting on the side of a flat bed without using bedrails?: None Help needed moving to and from a bed to a chair (including a wheelchair)?: A Little Help needed standing up from a chair using your arms (e.g., wheelchair or bedside chair)?: A Little Help needed to walk in hospital room?: A Little Help needed climbing 3-5 steps with a railing? : A Lot 6 Click Score: 19    End of Session   Activity Tolerance: Patient tolerated treatment well;No increased pain Patient left: Other (comment) (in doorway leading ocourtyard) Nurse Communication: Mobility status PT Visit Diagnosis: Unsteadiness on feet (R26.81);Difficulty in walking, not elsewhere classified (R26.2);Muscle weakness (generalized) (M62.81);History of falling (Z91.81)     Time: 8144-8185 PT Time Calculation (min) (ACUTE ONLY): 9 min  Charges:  $Therapeutic Activity: 8-22 mins                    12:43 PM, 02/09/22 Rosamaria Lints, PT, DPT Physical Therapist - [facility]  [phone] (ASCOM)     Rino Hosea C 02/09/2022, 12:37 PM

## 2022-02-09 NOTE — Progress Notes (Signed)
[facility] MD Progress Note  02/09/2022 4:13 PM CHRSTOPHER MALENFANT  MRN:  [id number] Subjective: Patient seen and chart reviewed.  Patient was tearful and agitated this morning although not violent.  Seems very confused and frightened.  Indicated that he was having auditory hallucinations. Principal Problem: Schizophrenia, paranoid type (HCC) Diagnosis: Principal Problem:   Schizophrenia, paranoid type (HCC)  Total Time spent with patient: 30 minutes  Past Psychiatric History: Past history of schizophrenia with noncompliance  Past Medical History:  Past Medical History:  Diagnosis Date   Cerebral palsy (HCC)    Depression    Diabetes mellitus without complication (HCC)    Hypertension    Kidney stones    Schizoaffective disorder (HCC)    Scoliosis     Past Surgical History:  Procedure Laterality Date   BOWEL RESECTION     LAPAROTOMY N/A 05/15/2020   Procedure: EXPLORATORY LAPAROTOMY;  Surgeon: Henrene Dodge, MD;  Location: [facility] ORS;  Service: General;  Laterality: N/A;   Family History:  Family History  Problem Relation Age of Onset   Heart disease Father    Heart attack Father    Family Psychiatric  History: See previous Social History:  Social History   Substance and Sexual Activity  Alcohol Use No     Social History   Substance and Sexual Activity  Drug Use No    Social History   Socioeconomic History   Marital status: Single    Spouse name: Not on file   Number of children: Not on file   Years of education: Not on file   Highest education level: Not on file  Occupational History   Not on file  Tobacco Use   Smoking status: Never   Smokeless tobacco: Never  Vaping Use   Vaping Use: Never used  Substance and Sexual Activity   Alcohol use: No   Drug use: No   Sexual activity: Not Currently  Other Topics Concern   Not on file  Social History Narrative   Not on file   Social Determinants of Health   Financial Resource Strain: Not on file  Food Insecurity:  Not on file  Transportation Needs: Not on file  Physical Activity: Not on file  Stress: Not on file  Social Connections: Not on file   Additional Social History:                         Sleep: Fair  Appetite:  Fair  Current Medications: Current Facility-Administered Medications  Medication Dose Route Frequency Provider Last Rate Last Admin   acetaminophen (TYLENOL) tablet 650 mg  650 mg Oral Q6H PRN Charm Rings, NP   650 mg at 02/06/22 1237   alum & mag hydroxide-simeth (MAALOX/MYLANTA) 200-200-20 MG/5ML suspension 30 mL  30 mL Oral Q4H PRN Charm Rings, NP       ARIPiprazole (ABILIFY) tablet 20 mg  20 mg Oral Daily Truett Mcfarlan T, MD   20 mg at 02/09/22 0809   aspirin EC tablet 81 mg  81 mg Oral Daily Charm Rings, NP   81 mg at 02/09/22 0809   clonazePAM (KLONOPIN) disintegrating tablet 0.5 mg  0.5 mg Oral BID PRN Lliam Hoh T, MD   0.5 mg at 02/09/22 1251   fenofibrate tablet 54 mg  54 mg Oral Daily Charm Rings, NP   54 mg at 02/09/22 3790   gemfibrozil (LOPID) tablet 600 mg  600 mg Oral BID Charm Rings,  NP   600 mg at 02/09/22 0809   insulin aspart (novoLOG) injection 0-15 Units  0-15 Units Subcutaneous TID WC Melbourne Jakubiak, [name], MD   2 Units at 02/09/22 0809   levothyroxine (SYNTHROID) tablet 88 mcg  88 mcg Oral Daily Charm Rings, NP   88 mcg at 02/09/22 0258   magnesium hydroxide (MILK OF MAGNESIA) suspension 30 mL  30 mL Oral Daily PRN Charm Rings, NP       metFORMIN (GLUCOPHAGE) tablet 1,000 mg  1,000 mg Oral BID WC Charm Rings, NP   1,000 mg at 02/09/22 0808   ondansetron (ZOFRAN) tablet 4 mg  4 mg Oral Q6H PRN Sarina Ill, DO   4 mg at 02/06/22 1755   pioglitazone (ACTOS) tablet 45 mg  45 mg Oral Daily Charm Rings, NP   45 mg at 02/09/22 5277   simvastatin (ZOCOR) tablet 20 mg  20 mg Oral QHS Charm Rings, NP   20 mg at 02/08/22 2131    Lab Results:  Results for orders placed or performed during the hospital  encounter of 02/05/22 (from the past 48 hour(s))  Glucose, capillary     Status: Abnormal   Collection Time: 02/07/22  4:15 PM  Result Value Ref Range   Glucose-Capillary 68 (L) 70 - 99 mg/dL    Comment: Glucose reference range applies only to samples taken after fasting for at least 8 hours.  Glucose, capillary     Status: Abnormal   Collection Time: 02/07/22  4:32 PM  Result Value Ref Range   Glucose-Capillary 54 (L) 70 - 99 mg/dL    Comment: Glucose reference range applies only to samples taken after fasting for at least 8 hours.  Glucose, capillary     Status: Abnormal   Collection Time: 02/07/22  4:46 PM  Result Value Ref Range   Glucose-Capillary 112 (H) 70 - 99 mg/dL    Comment: Glucose reference range applies only to samples taken after fasting for at least 8 hours.  Valproic acid level     Status: None   Collection Time: 02/08/22  7:10 AM  Result Value Ref Range   Valproic Acid Lvl 66 50.0 - 100.0 ug/mL    Comment: Performed at [facility], [address] [place], [place] [address]  Hemoglobin A1c     Status: Abnormal   Collection Time: 02/08/22  1:37 PM  Result Value Ref Range   Hgb A1c MFr Bld 7.0 (H) 4.8 - 5.6 %    Comment: (NOTE) Pre diabetes:          5.7%-6.4%  Diabetes:              >6.4%  Glycemic control for   <7.0% adults with diabetes    Mean Plasma Glucose 154.2 mg/dL    Comment: Performed at [facility], [address], [place], [place] [address]  Lipid panel     Status: None   Collection Time: 02/08/22  1:37 PM  Result Value Ref Range   Cholesterol 98 0 - 200 mg/dL   Triglycerides 60 <431 mg/dL   HDL 44 >54 mg/dL   Total CHOL/HDL Ratio 2.2 RATIO   VLDL 12 0 - 40 mg/dL   LDL Cholesterol 42 0 - 99 mg/dL    Comment:        Total Cholesterol/HDL:CHD Risk Coronary Heart Disease Risk Table  Men   Women  1/2 Average Risk   3.4   3.3  Average Risk       5.0   4.4  2 X Average Risk   9.6   7.1  3 X Average Risk   23.4   11.0        Use the calculated Patient Ratio above and the CHD Risk Table to determine the patient's CHD Risk.        ATP III CLASSIFICATION (LDL):  <100     mg/dL   Optimal  676-195  mg/dL   Near or Above                    Optimal  130-159  mg/dL   Borderline  093-267  mg/dL   High  >124     mg/dL   Very High Performed at [facility], [address] [place], [place] [address]   Glucose, capillary     Status: None   Collection Time: 02/08/22  4:09 PM  Result Value Ref Range   Glucose-Capillary 82 70 - 99 mg/dL    Comment: Glucose reference range applies only to samples taken after fasting for at least 8 hours.  Glucose, capillary     Status: None   Collection Time: 02/08/22  8:12 PM  Result Value Ref Range   Glucose-Capillary 83 70 - 99 mg/dL    Comment: Glucose reference range applies only to samples taken after fasting for at least 8 hours.  Glucose, capillary     Status: Abnormal   Collection Time: 02/09/22  7:11 AM  Result Value Ref Range   Glucose-Capillary 130 (H) 70 - 99 mg/dL    Comment: Glucose reference range applies only to samples taken after fasting for at least 8 hours.  Glucose, capillary     Status: None   Collection Time: 02/09/22 11:30 AM  Result Value Ref Range   Glucose-Capillary 99 70 - 99 mg/dL    Comment: Glucose reference range applies only to samples taken after fasting for at least 8 hours.    Blood Alcohol level:  Lab Results  Component Value Date   ETH <10 02/02/2022   ETH <10 01/18/2022    Metabolic Disorder Labs: Lab Results  Component Value Date   HGBA1C 7.0 (H) 02/08/2022   MPG 154.2 02/08/2022   MPG 116.89 05/13/2020   No results found for: "PROLACTIN" Lab Results  Component Value Date   CHOL 98 02/08/2022   TRIG 60 02/08/2022   HDL 44 02/08/2022   CHOLHDL 2.2 02/08/2022   VLDL 12 02/08/2022   LDLCALC 42 02/08/2022    Physical Findings: AIMS:  , ,  ,  ,    CIWA:    COWS:      Musculoskeletal: Strength & Muscle Tone: within normal limits Gait & Station: normal Patient leans: N/A  Psychiatric Specialty Exam:  Presentation  General Appearance: Disheveled  Eye Contact:None  Speech:Garbled; Pressured  Speech Volume:Increased  Handedness:Right   Mood and Affect  Mood:Dysphoric; Irritable  Affect:Inappropriate; Full Range   Thought Process  Thought Processes:Disorganized  Descriptions of Associations:Loose  Orientation:Partial  Thought Content:Illogical; Tangential; Scattered; Delusions; Paranoid Ideation  History of Schizophrenia/Schizoaffective disorder:Yes  Duration of Psychotic Symptoms:Greater than six months  Hallucinations:No data recorded Ideas of Reference:Paranoia  Suicidal Thoughts:No data recorded Homicidal Thoughts:No data recorded  Sensorium  Memory:Immediate Poor; Recent Poor; Remote Poor  Judgment:Impaired  Insight:Lacking   Executive Functions  Concentration:Poor  Attention Span:Poor  Recall:Poor  Fund of Knowledge:Poor  Language:Poor   Psychomotor Activity  Psychomotor Activity:No data recorded  Assets  Assets:Financial Resources/Insurance; Physical Health; Resilience; Social Support   Sleep  Sleep:No data recorded   Physical Exam: Physical Exam Vitals and nursing note reviewed.  Constitutional:      Appearance: Normal appearance.  HENT:     Head: Normocephalic and atraumatic.     Mouth/Throat:     Pharynx: Oropharynx is clear.  Eyes:     Pupils: Pupils are equal, round, and reactive to light.  Cardiovascular:     Rate and Rhythm: Normal rate and regular rhythm.  Pulmonary:     Effort: Pulmonary effort is normal.     Breath sounds: Normal breath sounds.  Abdominal:     General: Abdomen is flat.     Palpations: Abdomen is soft.  Musculoskeletal:        General: Normal range of motion.  Skin:    General: Skin is warm and dry.  Neurological:     General: No focal deficit  present.     Mental Status: He is alert. Mental status is at baseline.  Psychiatric:        Attention and Perception: He is inattentive. He perceives auditory hallucinations.        Mood and Affect: Mood is anxious. Affect is tearful.        Speech: Speech is tangential.        Behavior: Behavior is withdrawn.        Thought Content: Thought content is paranoid and delusional.        Cognition and Memory: Cognition is impaired.    Review of Systems  Constitutional: Negative.   HENT: Negative.    Eyes: Negative.   Respiratory: Negative.    Cardiovascular: Negative.   Gastrointestinal: Negative.   Musculoskeletal: Negative.   Skin: Negative.   Neurological: Negative.   Psychiatric/Behavioral:  Positive for hallucinations. The patient is nervous/anxious and has insomnia.    Blood pressure (!) 150/82, pulse (!) 114, temperature 99.3 F (37.4 C), temperature source Oral, resp. rate 18, height 6\' 4"  (1.93 m), weight 109.3 kg, SpO2 98 %. Body mass index is 29.34 kg/m.   Treatment Plan Summary: Medication management and Plan although he seems more agitated today it actually might be a little improvement over yesterday and that at least he is able to talk about his symptoms.  Reassured him that we were treating him aggressively for his psychosis and things would be getting better soon.  I will make sure he has PRNs for anxiety and for sleep as well.  , MD 02/09/2022, 4:13 PM

## 2022-02-10 DIAGNOSIS — F200 Paranoid schizophrenia: Secondary | ICD-10-CM | POA: Diagnosis not present

## 2022-02-10 LAB — GLUCOSE, CAPILLARY
Glucose-Capillary: 136 mg/dL — ABNORMAL HIGH (ref 70–99)
Glucose-Capillary: 110 mg/dL — ABNORMAL HIGH (ref 70–99)
Glucose-Capillary: 119 mg/dL — ABNORMAL HIGH (ref 70–99)
Glucose-Capillary: 132 mg/dL — ABNORMAL HIGH (ref 70–99)

## 2022-02-10 MED ORDER — HALOPERIDOL LACTATE 5 MG/ML IJ SOLN
5.0000 mg | Freq: Four times a day (QID) | INTRAMUSCULAR | Status: DC | PRN
  Administered 2022-02-19 – 2022-02-23 (×2): 5 mg via INTRAMUSCULAR
  Filled 2022-02-10 (×2): qty 1

## 2022-02-10 MED ORDER — HALOPERIDOL 5 MG PO TABS
5.0000 mg | ORAL_TABLET | Freq: Four times a day (QID) | ORAL | Status: DC | PRN
  Administered 2022-02-12 – 2022-02-17 (×6): 5 mg via ORAL
  Filled 2022-02-10 (×6): qty 1

## 2022-02-10 MED ORDER — ARIPIPRAZOLE 10 MG PO TABS
30.0000 mg | ORAL_TABLET | Freq: Every day | ORAL | Status: DC
  Administered 2022-02-11 – 2022-02-12 (×2): 30 mg via ORAL
  Filled 2022-02-10 (×2): qty 3

## 2022-02-10 NOTE — Progress Notes (Signed)
   02/10/22 1545  Clinical Encounter Type  Visited With Patient  Visit Type Initial;Spiritual support;Social support   Derrick Atkins greeted pt and shared some basic information with him about spiritual care services. Pt indicated that he would welcome a visit at this time.  Chaplain B offered non-anxious, compassionate presence. Pt asked about some specific music so this chaplain offered to play music using her phone. This seemed very soothing and meaningful for pt. This also helped [place] to foster a relationship of trust and care.  Dinner arrived so the visit was ended but we made a plan to f/u on 8/3. Instructions given to pt about how to request chaplain visit during the evening through his nurse.

## 2022-02-10 NOTE — Progress Notes (Signed)
Patient at and or around this time observed up at the nursing station staring through the nursing station glass, just before suddenly stopping and swatting at another patient as they passed by him, causing another patient's glasses to fall on the floor. Patient redirected by staff to his room, and then further redirected by this writer to the medication room to take medications for comfort and safety of the patient. After several minutes and many verbal prompts and encouragement to take medications for appreciable anxiety and paranoia, patient able to take PO medications and escorted to his room to try to relax and calm down. Will update provider team. Will continue to monitor closely.

## 2022-02-10 NOTE — Plan of Care (Addendum)
Patient during assessments this morning spoken to in the dayroom while he was looking for something to watch on tv. Patient during our engagement endorsed doing "better" this morning, with variable to brief eye contact, an odd and restricted affect, and an atypical interpersonal style. Patient engagement was largely characterized by irrelevant thought process, but some questions asked from assessment questions able to be answered appropriately. Patient endorsed he slept good over the night, reporting his legs didn't bother him as much, and also that today they are improved and not hurting thus far. Patient endorsed no si/hi/avh and denied paranoid ideations. Patient endorsed he has been tolerating his medications. Patient endorsed he has been able to walk and not needing his walker. Patient given encouragement to walk with care if not using walker.   Patient has been complaint with medications and unit procedures thus far. Pt. Has been observed eating good thus far. Pt. Has been able to remain safe on the unit thus far. Patient outside of meals has largely been observed in the dayroom watching tv.   Q x 15 minute observation checks in place/maintained for safety. Patient is provided with education throughout shift when appropriate and able.  Patient is given/offered medications per orders. Patient is encouraged to attend groups, participate in unit activities and continue with plan of care. Pt. Chart and plans of care reviewed. Pt. Given support and encouragement when appropriate and able.        Problem: Pain Managment: Goal: General experience of comfort will improve Outcome: Progressing Note: Reports pain has been better today thus far.    Problem: Safety: Goal: Ability to remain free from injury will improve Outcome: Progressing Note: Patient denied si/hi/avh. Patient endorsed has been able to ambulate without pain and without utilizing walker as needed.    Problem: Education: Goal: Mental  status will improve Outcome: Progressing Note: Patient able to participate better during assessments with staff.

## 2022-02-10 NOTE — Progress Notes (Signed)
[facility] [place] MD Progress Note  02/10/2022 1:34 PM Derrick Atkins  MRN:  [id number] Subjective: This patient seen and chart reviewed.  Very frightened looking today.  Walks around the unit staring at people but would not engage in conversation.  Tried to invite the patient to speak with me in the office and he was too frightened to come in.  Shied away from me when I tried to talk with him. Principal Problem: Schizophrenia, paranoid type (HCC) Diagnosis: Principal Problem:   Schizophrenia, paranoid type (HCC)  Total Time spent with patient: 30 minutes  Past Psychiatric History: History of schizophrenia  Past Medical History:  Past Medical History:  Diagnosis Date   Cerebral palsy (HCC)    Depression    Diabetes mellitus without complication (HCC)    Hypertension    Kidney stones    Schizoaffective disorder (HCC)    Scoliosis     Past Surgical History:  Procedure Laterality Date   BOWEL RESECTION     LAPAROTOMY N/A 05/15/2020   Procedure: EXPLORATORY LAPAROTOMY;  Surgeon: Henrene Dodge, MD;  Location: [facility] ORS;  Service: General;  Laterality: N/A;   Family History:  Family History  Problem Relation Age of Onset   Heart disease Father    Heart attack Father    Family Psychiatric  History: See previous Social History:  Social History   Substance and Sexual Activity  Alcohol Use No     Social History   Substance and Sexual Activity  Drug Use No    Social History   Socioeconomic History   Marital status: Single    Spouse name: Not on file   Number of children: Not on file   Years of education: Not on file   Highest education level: Not on file  Occupational History   Not on file  Tobacco Use   Smoking status: Never   Smokeless tobacco: Never  Vaping Use   Vaping Use: Never used  Substance and Sexual Activity   Alcohol use: No   Drug use: No   Sexual activity: Not Currently  Other Topics Concern   Not on file  Social History Narrative   Not on file   Social  Determinants of Health   Financial Resource Strain: Not on file  Food Insecurity: Not on file  Transportation Needs: Not on file  Physical Activity: Not on file  Stress: Not on file  Social Connections: Not on file   Additional Social History:                         Sleep: Fair  Appetite:  Fair  Current Medications: Current Facility-Administered Medications  Medication Dose Route Frequency Provider Last Rate Last Admin   acetaminophen (TYLENOL) tablet 650 mg  650 mg Oral Q6H PRN Charm Rings, NP   650 mg at 02/10/22 0005   alum & mag hydroxide-simeth (MAALOX/MYLANTA) 200-200-20 MG/5ML suspension 30 mL  30 mL Oral Q4H PRN Charm Rings, NP       [START ON 02/11/2022] ARIPiprazole (ABILIFY) tablet 30 mg  30 mg Oral Daily Skylen Danielsen T, MD       aspirin EC tablet 81 mg  81 mg Oral Daily Charm Rings, NP   81 mg at 02/10/22 6063   clonazePAM (KLONOPIN) disintegrating tablet 0.5 mg  0.5 mg Oral BID PRN Idaliz Tinkle T, MD   0.5 mg at 02/10/22 0005   fenofibrate tablet 54 mg  54 mg Oral Daily Lord,  Herminio Heads, NP   54 mg at 02/10/22 0729   gemfibrozil (LOPID) tablet 600 mg  600 mg Oral BID Charm Rings, NP   600 mg at 02/10/22 5974   hydrOXYzine (ATARAX) tablet 50 mg  50 mg Oral TID PRN Amayia Ciano, [name], MD   50 mg at 02/09/22 2103   insulin aspart (novoLOG) injection 0-15 Units  0-15 Units Subcutaneous TID WC Sharen Youngren, [name], MD   2 Units at 02/10/22 0755   levothyroxine (SYNTHROID) tablet 88 mcg  88 mcg Oral Daily Charm Rings, NP   88 mcg at 02/10/22 0646   magnesium hydroxide (MILK OF MAGNESIA) suspension 30 mL  30 mL Oral Daily PRN Charm Rings, NP   30 mL at 02/10/22 0610   metFORMIN (GLUCOPHAGE) tablet 1,000 mg  1,000 mg Oral BID WC Charm Rings, NP   1,000 mg at 02/10/22 0729   ondansetron (ZOFRAN) tablet 4 mg  4 mg Oral Q6H PRN Sarina Ill, DO   4 mg at 02/06/22 1755   pioglitazone (ACTOS) tablet 45 mg  45 mg Oral Daily Charm Rings, NP    45 mg at 02/10/22 1638   simvastatin (ZOCOR) tablet 20 mg  20 mg Oral QHS Charm Rings, NP   20 mg at 02/09/22 2102   temazepam (RESTORIL) capsule 15 mg  15 mg Oral QHS PRN Makynna Manocchio, [name], MD   15 mg at 02/09/22 2102    Lab Results:  Results for orders placed or performed during the hospital encounter of 02/05/22 (from the past 48 hour(s))  Hemoglobin A1c     Status: Abnormal   Collection Time: 02/08/22  1:37 PM  Result Value Ref Range   Hgb A1c MFr Bld 7.0 (H) 4.8 - 5.6 %    Comment: (NOTE) Pre diabetes:          5.7%-6.4%  Diabetes:              >6.4%  Glycemic control for   <7.0% adults with diabetes    Mean Plasma Glucose 154.2 mg/dL    Comment: Performed at [facility], [address], [place], [place] [address]  Lipid panel     Status: None   Collection Time: 02/08/22  1:37 PM  Result Value Ref Range   Cholesterol 98 0 - 200 mg/dL   Triglycerides 60 <680 mg/dL   HDL 44 >32 mg/dL   Total CHOL/HDL Ratio 2.2 RATIO   VLDL 12 0 - 40 mg/dL   LDL Cholesterol 42 0 - 99 mg/dL    Comment:        Total Cholesterol/HDL:CHD Risk Coronary Heart Disease Risk Table                     Men   Women  1/2 Average Risk   3.4   3.3  Average Risk       5.0   4.4  2 X Average Risk   9.6   7.1  3 X Average Risk  23.4   11.0        Use the calculated Patient Ratio above and the CHD Risk Table to determine the patient's CHD Risk.        ATP III CLASSIFICATION (LDL):  <100     mg/dL   Optimal  122-482  mg/dL   Near or Above  Optimal  130-159  mg/dL   Borderline  824-235  mg/dL   High  >361     mg/dL   Very High Performed at [facility], [address] [place], [place] [address]   Glucose, capillary     Status: None   Collection Time: 02/08/22  4:09 PM  Result Value Ref Range   Glucose-Capillary 82 70 - 99 mg/dL    Comment: Glucose reference range applies only to samples taken after fasting for at least 8 hours.  Glucose, capillary      Status: None   Collection Time: 02/08/22  8:12 PM  Result Value Ref Range   Glucose-Capillary 83 70 - 99 mg/dL    Comment: Glucose reference range applies only to samples taken after fasting for at least 8 hours.  Glucose, capillary     Status: Abnormal   Collection Time: 02/09/22  7:11 AM  Result Value Ref Range   Glucose-Capillary 130 (H) 70 - 99 mg/dL    Comment: Glucose reference range applies only to samples taken after fasting for at least 8 hours.  Glucose, capillary     Status: None   Collection Time: 02/09/22 11:30 AM  Result Value Ref Range   Glucose-Capillary 99 70 - 99 mg/dL    Comment: Glucose reference range applies only to samples taken after fasting for at least 8 hours.  Glucose, capillary     Status: Abnormal   Collection Time: 02/09/22  4:23 PM  Result Value Ref Range   Glucose-Capillary 110 (H) 70 - 99 mg/dL    Comment: Glucose reference range applies only to samples taken after fasting for at least 8 hours.  Glucose, capillary     Status: Abnormal   Collection Time: 02/09/22  8:06 PM  Result Value Ref Range   Glucose-Capillary 120 (H) 70 - 99 mg/dL    Comment: Glucose reference range applies only to samples taken after fasting for at least 8 hours.  Glucose, capillary     Status: Abnormal   Collection Time: 02/10/22  6:50 AM  Result Value Ref Range   Glucose-Capillary 136 (H) 70 - 99 mg/dL    Comment: Glucose reference range applies only to samples taken after fasting for at least 8 hours.  Glucose, capillary     Status: Abnormal   Collection Time: 02/10/22 11:40 AM  Result Value Ref Range   Glucose-Capillary 110 (H) 70 - 99 mg/dL    Comment: Glucose reference range applies only to samples taken after fasting for at least 8 hours.    Blood Alcohol level:  Lab Results  Component Value Date   ETH <10 02/02/2022   ETH <10 01/18/2022    Metabolic Disorder Labs: Lab Results  Component Value Date   HGBA1C 7.0 (H) 02/08/2022   MPG 154.2 02/08/2022   MPG  116.89 05/13/2020   No results found for: "PROLACTIN" Lab Results  Component Value Date   CHOL 98 02/08/2022   TRIG 60 02/08/2022   HDL 44 02/08/2022   CHOLHDL 2.2 02/08/2022   VLDL 12 02/08/2022   LDLCALC 42 02/08/2022    Physical Findings: AIMS:  , ,  ,  ,    CIWA:    COWS:     Musculoskeletal: Strength & Muscle Tone: within normal limits Gait & Station: normal Patient leans: N/A  Psychiatric Specialty Exam:  Presentation  General Appearance: Disheveled  Eye Contact:None  Speech:Garbled; Pressured  Speech Volume:Increased  Handedness:Right   Mood and Affect  Mood:Dysphoric; Irritable  Affect:Inappropriate; Full Range   Thought Process  Thought Processes:Disorganized  Descriptions of Associations:Loose  Orientation:Partial  Thought Content:Illogical; Tangential; Scattered; Delusions; Paranoid Ideation  History of Schizophrenia/Schizoaffective disorder:Yes  Duration of Psychotic Symptoms:Greater than six months  Hallucinations:No data recorded Ideas of Reference:Paranoia  Suicidal Thoughts:No data recorded Homicidal Thoughts:No data recorded  Sensorium  Memory:Immediate Poor; Recent Poor; Remote Poor  Judgment:Impaired  Insight:Lacking   Executive Functions  Concentration:Poor  Attention Span:Poor  Recall:Poor  Fund of Knowledge:Poor  Language:Poor   Psychomotor Activity  Psychomotor Activity:No data recorded  Assets  Assets:Financial Resources/Insurance; Physical Health; Resilience; Social Support   Sleep  Sleep:No data recorded   Physical Exam: Physical Exam Vitals and nursing note reviewed.  Constitutional:      Appearance: Normal appearance.  HENT:     Head: Normocephalic and atraumatic.     Mouth/Throat:     Pharynx: Oropharynx is clear.  Eyes:     Pupils: Pupils are equal, round, and reactive to light.  Cardiovascular:     Rate and Rhythm: Normal rate and regular rhythm.  Pulmonary:     Effort: Pulmonary  effort is normal.     Breath sounds: Normal breath sounds.  Abdominal:     General: Abdomen is flat.     Palpations: Abdomen is soft.  Musculoskeletal:        General: Normal range of motion.  Skin:    General: Skin is warm and dry.  Neurological:     General: No focal deficit present.     Mental Status: He is alert. Mental status is at baseline.  Psychiatric:        Attention and Perception: He is inattentive.        Mood and Affect: Mood is anxious. Affect is tearful and inappropriate.        Speech: He is noncommunicative.        Behavior: Behavior is withdrawn.        Thought Content: Thought content is paranoid.        Cognition and Memory: Cognition is impaired.    Review of Systems  Unable to perform ROS: Psychiatric disorder  Skin: Negative.    Blood pressure (!) 141/84, pulse (!) 107, temperature 97.6 F (36.4 C), temperature source Oral, resp. rate 18, height 6\' 4"  (1.93 m), weight 109.3 kg, SpO2 97 %. Body mass index is 29.34 kg/m.   Treatment Plan Summary: Medication management and Plan increase Abilify to 30 mg.  I am aware that he has had extended QT corrected intervals on his EKG but he desperately needs to treat this crippling psychosis.  Abilify is generally considered relatively low risk to extend QTc.  , MD 02/10/2022, 1:34 PM

## 2022-02-10 NOTE — Group Note (Signed)
[facility] LCSW Group Therapy Note   Group Date: 02/10/2022 Start Time: 1300 End Time: 1400   Type of Therapy/Topic:  Group Therapy:  Emotion Regulation  Participation Level:  Did Not Attend    Description of Group:    The purpose of this group is to assist patients in learning to regulate negative emotions and experience positive emotions. Patients will be guided to discuss ways in which they have been vulnerable to their negative emotions. These vulnerabilities will be juxtaposed with experiences of positive emotions or situations, and patients challenged to use positive emotions to combat negative ones. Special emphasis will be placed on coping with negative emotions in conflict situations, and patients will process healthy conflict resolution skills.  Therapeutic Goals: Patient will identify two positive emotions or experiences to reflect on in order to balance out negative emotions:  Patient will label two or more emotions that they find the most difficult to experience:  Patient will be able to demonstrate positive conflict resolution skills through discussion or role plays:   Summary of Patient Progress: Despite invitation, pt declined to participate in group.   Therapeutic Modalities:   Cognitive Behavioral Therapy Feelings Identification Dialectical Behavioral Therapy   Avalina Benko R Tiarra Anastacio, LCSW 

## 2022-02-10 NOTE — Progress Notes (Signed)
Pt presents with a better affect tonight compared to last night when this writer had him. Pt more verbal tonight expressing his concerns about medications and his anxiety. Pt given education and support. Pt observed interacting appropriately with staff and peers on the unit. Pt compliant with medication administration per MD orders. Pt being monitored Q 15 minutes for safety per unit protocol, remains safe on the unit.
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
## 2022-02-11 DIAGNOSIS — F200 Paranoid schizophrenia: Secondary | ICD-10-CM | POA: Diagnosis not present

## 2022-02-11 LAB — GLUCOSE, CAPILLARY
Glucose-Capillary: 85 mg/dL (ref 70–99)
Glucose-Capillary: 92 mg/dL (ref 70–99)
Glucose-Capillary: 92 mg/dL (ref 70–99)
Glucose-Capillary: 121 mg/dL — ABNORMAL HIGH (ref 70–99)

## 2022-02-11 LAB — POTASSIUM: Potassium: 4.3 mmol/L (ref 3.5–5.1)

## 2022-02-11 LAB — MAGNESIUM: Magnesium: 2.2 mg/dL (ref 1.7–2.4)

## 2022-02-11 MED ORDER — BENZTROPINE MESYLATE 1 MG PO TABS
1.0000 mg | ORAL_TABLET | Freq: Every day | ORAL | Status: DC
  Administered 2022-02-11 – 2022-02-24 (×14): 1 mg via ORAL
  Filled 2022-02-11 (×15): qty 1

## 2022-02-11 NOTE — Progress Notes (Signed)
Physical Therapy Treatment Patient Details Name: Derrick Atkins MRN: [id number] DOB: 1971/05/02 Today's Date: 02/11/2022   History of Present Illness Derrick Atkins is a 50yoM who comes to [facility] on [facility]/02/22 via [facility] under [facility]C with facility reports of decompensating since DC 7/21. PMH: cerebral palsy, HTN, DM, LEE, and schizoaffective disorder. Pt reports he tripped day of evaluation, feet got tangled up while trying to rise from table and he fell and hit his right elbow, says it feels ok right now after getting some pain medication earlier.    PT Comments    Pt upright in hallway upon arrival complaining about another pt watching him.  Pt agreeable to work with therapist and was able to walk around the facility without any need for assistive device.  Pt not able to be directed to bedroom to perform exercises/MMT, but demonstrates independence with mobility at this time.  Pt left in front of room standing at doorway.  Patient is at baseline, all education completed, and time is given to address all questions/concerns. No additional skilled PT services needed at this time, PT signing off. PT recommends daily ambulation ad lib or with nursing staff as needed to prevent deconditioning.     Recommendations for follow up therapy are one component of a multi-disciplinary discharge planning process, led by the attending physician.  Recommendations may be updated based on patient status, additional functional criteria and insurance authorization.  Follow Up Recommendations  Home health PT     Assistance Recommended at Discharge Intermittent Supervision/Assistance  Patient can return home with the following A little help with walking and/or transfers;A little help with bathing/dressing/bathroom;Assistance with cooking/housework;Assist for transportation;Help with stairs or ramp for entrance   Equipment Recommendations  None recommended by PT    Recommendations for Other Services       Precautions  / Restrictions Precautions Precautions: Fall Restrictions Weight Bearing Restrictions: No     Mobility  Bed Mobility               General bed mobility comments: pt received standing in hallway.    Transfers                        Ambulation/Gait Ambulation/Gait assistance: Modified independent (Device/Increase time) Gait Distance (Feet): 350 Feet Assistive device: None Gait Pattern/deviations: Step-through pattern, Decreased step length - right, Decreased step length - left Gait velocity: decreased     General Gait Details: pt has forward flexed posture when in standing and during ambulation, but overall mobilizing well.   Stairs             Wheelchair Mobility    Modified Rankin (Stroke Patients Only)       Balance Overall balance assessment: Modified Independent                                          Cognition Arousal/Alertness: Awake/alert Behavior During Therapy: WFL for tasks assessed/performed Overall Cognitive Status: Within Functional Limits for tasks assessed                                          Exercises      General Comments        Pertinent Vitals/Pain Pain Assessment Pain Assessment: No/denies pain  Home Living                          Prior Function            PT Goals (current goals can now be found in the care plan section) Acute Rehab PT Goals Patient Stated Goal: feel better when walking, not fall again PT Goal Formulation: With patient Time For Goal Achievement: 02/20/22 Potential to Achieve Goals: Fair Progress towards PT goals: Goals met/education completed, patient discharged from PT    Frequency    Min 2X/week      PT Plan Current plan remains appropriate    Co-evaluation              AM-PAC PT "6 Clicks" Mobility   Outcome Measure  Help needed turning from your back to your side while in a flat bed without using bedrails?:  None Help needed moving from lying on your back to sitting on the side of a flat bed without using bedrails?: None Help needed moving to and from a bed to a chair (including a wheelchair)?: A Little Help needed standing up from a chair using your arms (e.g., wheelchair or bedside chair)?: A Little Help needed to walk in hospital room?: A Little Help needed climbing 3-5 steps with a railing? : A Little 6 Click Score: 20    End of Session   Activity Tolerance: Patient tolerated treatment well;No increased pain Patient left: Other (comment) (in hallway in front of room.) Nurse Communication: Mobility status PT Visit Diagnosis: Unsteadiness on feet (R26.81);Difficulty in walking, not elsewhere classified (R26.2);Muscle weakness (generalized) (M62.81);History of falling (Z91.81)     Time: 1100-1120 PT Time Calculation (min) (ACUTE ONLY): 20 min  Charges:  $Gait Training: 8-22 mins                     Gwenlyn Saran, PT, DPT 02/11/22, 1:39 PM

## 2022-02-11 NOTE — Group Note (Signed)
[facility] LCSW Group Therapy Note   Group Date: 02/11/2022 Start Time: 1300 End Time: 1400   Type of Therapy/Topic:  Group Therapy:  Balance in Life  Participation Level:  Did Not Attend   Description of Group:    This group will address the concept of balance and how it feels and looks when one is unbalanced. Patients will be encouraged to process areas in their lives that are out of balance, and identify reasons for remaining unbalanced. Facilitators will guide patients utilizing problem- solving interventions to address and correct the stressor making their life unbalanced. Understanding and applying boundaries will be explored and addressed for obtaining  and maintaining a balanced life. Patients will be encouraged to explore ways to assertively make their unbalanced needs known to significant others in their lives, using other group members and facilitator for support and feedback.  Therapeutic Goals: Patient will identify two or more emotions or situations they have that consume much of in their lives. Patient will identify signs/triggers that life has become out of balance:  Patient will identify two ways to set boundaries in order to achieve balance in their lives:  Patient will demonstrate ability to communicate their needs through discussion and/or role plays  Summary of Patient Progress:    X    Therapeutic Modalities:   Cognitive Behavioral Therapy Solution-Focused Therapy Assertiveness Training   Nahla Lukin J Tinlee Navarrette, LCSW 

## 2022-02-11 NOTE — Progress Notes (Signed)
D: Patient alert and oriented. Patient denies pain. Patient denies anxiety and depression. Patient denies SI/HI/AVH. Patient has been standing in the hallway in front of bedroom staring at staff members in the nurses station.   A: Scheduled medications administered to patient, per MD orders.  Support and encouragement provided to patient.  Q15 minute safety checks maintained.   R: Patient compliant with medication administration and treatment plan. No adverse drug reactions noted. Patient remains safe on the unit at this time.

## 2022-02-11 NOTE — Plan of Care (Signed)
  Problem: Education: Goal: Knowledge of [facility] Education information/materials will improve Outcome: Progressing Goal: Verbalization of understanding the information provided will improve Outcome: Progressing   Problem: Health Behavior/Discharge Planning: Goal: Compliance with treatment plan for underlying cause of condition will improve Outcome: Progressing

## 2022-02-11 NOTE — Progress Notes (Signed)
Recreation Therapy Notes  Date: 02/11/2022   Time: 10:15 am    Location: Craft room   Behavioral response: Isolated   Intervention Topic:  Stress Management    Discussion/Intervention:  Group content on today was focused on stress. The group defined stress and way to cope with stress. Participants expressed how they know when they are stresses out. Individuals described the different ways they have to cope with stress. The group stated reasons why it is important to cope with stress. Patient explained what good stress is and some examples. The group participated in the intervention "Stress Management". Individuals were separated into two group and answered questions related to stress.  Clinical Observations/Feedback: Patient came to group and was focused on what peers and staff had to say about stress management. Participant left group early and did not return.  Xan Ingraham LRT/CTRS        Antania Hoefling 02/11/2022 1:53 PM

## 2022-02-11 NOTE — Progress Notes (Signed)
   02/11/22 2000  Psych Admission Type (Psych Patients Only)  Admission Status Involuntary  Psychosocial Assessment  Patient Complaints None  Eye Contact Brief  Facial Expression Flat  Affect Constricted  Speech Soft;Tangential  Interaction Minimal  Motor Activity Slow  Appearance/Hygiene Unremarkable  Behavior Characteristics Cooperative  Mood Pleasant  Aggressive Behavior  Effect No apparent injury  Thought Process  Coherency Disorganized;Tangential  Content WDL  Delusions None reported or observed  Perception WDL  Hallucination None reported or observed  Judgment Impaired  Confusion Mild  Danger to Self  Current suicidal ideation? Denies  Danger to Others  Danger to Others None reported or observed
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
## 2022-02-12 DIAGNOSIS — F200 Paranoid schizophrenia: Secondary | ICD-10-CM | POA: Diagnosis not present

## 2022-02-12 LAB — GLUCOSE, CAPILLARY
Glucose-Capillary: 89 mg/dL (ref 70–99)
Glucose-Capillary: 99 mg/dL (ref 70–99)
Glucose-Capillary: 79 mg/dL (ref 70–99)

## 2022-02-12 MED ORDER — ARIPIPRAZOLE 10 MG PO TABS
40.0000 mg | ORAL_TABLET | Freq: Every day | ORAL | Status: DC
  Administered 2022-02-13 – 2022-02-16 (×4): 40 mg via ORAL
  Filled 2022-02-12 (×4): qty 4

## 2022-02-12 NOTE — Progress Notes (Signed)
Dar Note: Patient presents with flat affect and depressed mood.  Medications given with several encouragements.  Spit some of his meds back into his cup of water.  Patient became upset when encouraged to take his meds.  Routine safety checks maintained. Minimal interaction with staff and peers.  Support offered as needed.  Patient is safe on the unit.

## 2022-02-12 NOTE — Progress Notes (Signed)
Recreation Therapy Notes    Date: 02/12/2022   Time: 10:45 am     Location: Craft room    Behavioral response: N/A   Intervention Topic: Honesty    Discussion/Intervention: Patient refused to attend group.    Clinical Observations/Feedback:  Patient refused to attend group.    Bennie Scaff LRT/CTRS        Saranya Harlin 02/12/2022 11:29 AM

## 2022-02-12 NOTE — Group Note (Signed)
[facility] LCSW Group Therapy Note   Group Date: 02/12/2022 Start Time: 1300 End Time: 1400  Type of Therapy and Topic:  Group Therapy:  Feelings around Relapse and Recovery  Participation Level:  Minimal    Description of Group:    Patients in this group will discuss emotions they experience before and after a relapse. They will process how experiencing these feelings, or avoidance of experiencing them, relates to having a relapse. Facilitator will guide patients to explore emotions they have related to recovery. Patients will be encouraged to process which emotions are more powerful. They will be guided to discuss the emotional reaction significant others in their lives may have to patients' relapse or recovery. Patients will be assisted in exploring ways to respond to the emotions of others without this contributing to a relapse.  Therapeutic Goals: Patient will identify two or more emotions that lead to relapse for them:  Patient will identify two emotions that result when they relapse:  Patient will identify two emotions related to recovery:  Patient will demonstrate ability to communicate their needs through discussion and/or role plays.   Summary of Patient Progress: Patient was present for the entirety of CSW group. He was the only person who showed up. Pt and CSW briefly discussed his day, which he stated was going ok, lunch had been fine, and that he likes to play video games. Pt was appropriate during conversation, but spoke in a low tone and his affect was flat. Due to pt presentation, his lack of engagement in the conversation, and lack of peers attending group session; individual session was ended.    Therapeutic Modalities:   Cognitive Behavioral Therapy Solution-Focused Therapy Assertiveness Training Relapse Prevention Therapy   Glenis Smoker, LCSW

## 2022-02-12 NOTE — Progress Notes (Signed)
Patient had Restoril for sleep did not manage to get a lot of sleep this shift Patient has been requesting to get his phone during the day so he can play some games. Patient said " I just want to play games that's what I do most of the time at home' Patient was reeducated about the unit rules and support and encouragement provided. Patient was offered to attend on unit programming.   Support and encouragement provided.

## 2022-02-12 NOTE — Progress Notes (Signed)
[facility] MD Progress Note  02/12/2022 11:16 AM Derrick Atkins  MRN:  [id number] Subjective: Follow-up patient with schizophrenia.  Patient was pacing around his room naked today looking very confused.  Remains confused in conversation and admitting to hallucinations disorganized thinking.  Blood sugars are staying under very good control with just his oral agent.  Potassium and magnesium checked yesterday were normal. Principal Problem: Schizophrenia, paranoid type (HCC) Diagnosis: Principal Problem:   Schizophrenia, paranoid type (HCC)  Total Time spent with patient: 30 minutes  Past Psychiatric History: Past history of schizophrenia with previous stabilization on Abilify  Past Medical History:  Past Medical History:  Diagnosis Date   Cerebral palsy (HCC)    Depression    Diabetes mellitus without complication (HCC)    Hypertension    Kidney stones    Schizoaffective disorder (HCC)    Scoliosis     Past Surgical History:  Procedure Laterality Date   BOWEL RESECTION     LAPAROTOMY N/A 05/15/2020   Procedure: EXPLORATORY LAPAROTOMY;  Surgeon: Henrene Dodge, MD;  Location: [facility] ORS;  Service: General;  Laterality: N/A;   Family History:  Family History  Problem Relation Age of Onset   Heart disease Father    Heart attack Father    Family Psychiatric  History: See previous Social History:  Social History   Substance and Sexual Activity  Alcohol Use No     Social History   Substance and Sexual Activity  Drug Use No    Social History   Socioeconomic History   Marital status: Single    Spouse name: Not on file   Number of children: Not on file   Years of education: Not on file   Highest education level: Not on file  Occupational History   Not on file  Tobacco Use   Smoking status: Never   Smokeless tobacco: Never  Vaping Use   Vaping Use: Never used  Substance and Sexual Activity   Alcohol use: No   Drug use: No   Sexual activity: Not Currently  Other Topics  Concern   Not on file  Social History Narrative   Not on file   Social Determinants of Health   Financial Resource Strain: Not on file  Food Insecurity: Not on file  Transportation Needs: Not on file  Physical Activity: Not on file  Stress: Not on file  Social Connections: Not on file   Additional Social History:                         Sleep: Fair  Appetite:  Fair  Current Medications: Current Facility-Administered Medications  Medication Dose Route Frequency Provider Last Rate Last Admin   acetaminophen (TYLENOL) tablet 650 mg  650 mg Oral Q6H PRN Charm Rings, NP   650 mg at 02/10/22 0005   alum & mag hydroxide-simeth (MAALOX/MYLANTA) 200-200-20 MG/5ML suspension 30 mL  30 mL Oral Q4H PRN Charm Rings, NP       [START ON 02/13/2022] ARIPiprazole (ABILIFY) tablet 40 mg  40 mg Oral Daily Dylen Mcelhannon T, MD       aspirin EC tablet 81 mg  81 mg Oral Daily Charm Rings, NP   81 mg at 02/12/22 0815   benztropine (COGENTIN) tablet 1 mg  1 mg Oral Daily Alyria Krack T, MD   1 mg at 02/12/22 0815   clonazePAM (KLONOPIN) disintegrating tablet 0.5 mg  0.5 mg Oral BID PRN Ciji Boston, [name],  MD   0.5 mg at 02/11/22 2109   fenofibrate tablet 54 mg  54 mg Oral Daily Charm Rings, NP   54 mg at 02/12/22 0816   gemfibrozil (LOPID) tablet 600 mg  600 mg Oral BID Charm Rings, NP   600 mg at 02/12/22 0815   haloperidol (HALDOL) tablet 5 mg  5 mg Oral Q6H PRN Nicci Vaughan, [name], MD       Or   haloperidol lactate (HALDOL) injection 5 mg  5 mg Intramuscular Q6H PRN Pharoah Goggins, [name], MD       hydrOXYzine (ATARAX) tablet 50 mg  50 mg Oral TID PRN Kana Reimann T, MD   50 mg at 02/10/22 1349   insulin aspart (novoLOG) injection 0-15 Units  0-15 Units Subcutaneous TID WC Tanita Palinkas, [name], MD   2 Units at 02/10/22 0755   levothyroxine (SYNTHROID) tablet 88 mcg  88 mcg Oral Daily Charm Rings, NP   88 mcg at 02/12/22 2952   magnesium hydroxide (MILK OF MAGNESIA) suspension 30 mL  30  mL Oral Daily PRN Charm Rings, NP   30 mL at 02/10/22 0610   metFORMIN (GLUCOPHAGE) tablet 1,000 mg  1,000 mg Oral BID WC Charm Rings, NP   1,000 mg at 02/12/22 0814   ondansetron (ZOFRAN) tablet 4 mg  4 mg Oral Q6H PRN Sarina Ill, DO   4 mg at 02/06/22 1755   pioglitazone (ACTOS) tablet 45 mg  45 mg Oral Daily Charm Rings, NP   45 mg at 02/12/22 0815   simvastatin (ZOCOR) tablet 20 mg  20 mg Oral QHS Charm Rings, NP   20 mg at 02/11/22 2108   temazepam (RESTORIL) capsule 15 mg  15 mg Oral QHS PRN Keivon Garden, [name], MD   15 mg at 02/11/22 2108    Lab Results:  Results for orders placed or performed during the hospital encounter of 02/05/22 (from the past 48 hour(s))  Glucose, capillary     Status: Abnormal   Collection Time: 02/10/22 11:40 AM  Result Value Ref Range   Glucose-Capillary 110 (H) 70 - 99 mg/dL    Comment: Glucose reference range applies only to samples taken after fasting for at least 8 hours.  Glucose, capillary     Status: Abnormal   Collection Time: 02/10/22  4:31 PM  Result Value Ref Range   Glucose-Capillary 119 (H) 70 - 99 mg/dL    Comment: Glucose reference range applies only to samples taken after fasting for at least 8 hours.  Glucose, capillary     Status: Abnormal   Collection Time: 02/10/22  8:58 PM  Result Value Ref Range   Glucose-Capillary 132 (H) 70 - 99 mg/dL    Comment: Glucose reference range applies only to samples taken after fasting for at least 8 hours.  Glucose, capillary     Status: None   Collection Time: 02/11/22  6:21 AM  Result Value Ref Range   Glucose-Capillary 85 70 - 99 mg/dL    Comment: Glucose reference range applies only to samples taken after fasting for at least 8 hours.  Potassium     Status: None   Collection Time: 02/11/22  7:31 AM  Result Value Ref Range   Potassium 4.3 3.5 - 5.1 mmol/L    Comment: Performed at [facility], [address], [place], [place] [address]  Magnesium     Status:  None   Collection Time: 02/11/22  7:31 AM  Result Value Ref Range   Magnesium 2.2 1.7 - 2.4 mg/dL    Comment: Performed at [facility], [address] [place], [place] [address]  Glucose, capillary     Status: None   Collection Time: 02/11/22 11:33 AM  Result Value Ref Range   Glucose-Capillary 92 70 - 99 mg/dL    Comment: Glucose reference range applies only to samples taken after fasting for at least 8 hours.  Glucose, capillary     Status: None   Collection Time: 02/11/22  4:28 PM  Result Value Ref Range   Glucose-Capillary 92 70 - 99 mg/dL    Comment: Glucose reference range applies only to samples taken after fasting for at least 8 hours.  Glucose, capillary     Status: Abnormal   Collection Time: 02/11/22  7:49 PM  Result Value Ref Range   Glucose-Capillary 121 (H) 70 - 99 mg/dL    Comment: Glucose reference range applies only to samples taken after fasting for at least 8 hours.   Comment 1 Notify RN   Glucose, capillary     Status: None   Collection Time: 02/12/22  6:37 AM  Result Value Ref Range   Glucose-Capillary 89 70 - 99 mg/dL    Comment: Glucose reference range applies only to samples taken after fasting for at least 8 hours.    Blood Alcohol level:  Lab Results  Component Value Date   ETH <10 02/02/2022   ETH <10 01/18/2022    Metabolic Disorder Labs: Lab Results  Component Value Date   HGBA1C 7.0 (H) 02/08/2022   MPG 154.2 02/08/2022   MPG 116.89 05/13/2020   No results found for: "PROLACTIN" Lab Results  Component Value Date   CHOL 98 02/08/2022   TRIG 60 02/08/2022   HDL 44 02/08/2022   CHOLHDL 2.2 02/08/2022   VLDL 12 02/08/2022   LDLCALC 42 02/08/2022    Physical Findings: AIMS:  , ,  ,  ,    CIWA:    COWS:     Musculoskeletal: Strength & Muscle Tone: within normal limits Gait & Station: normal Patient leans: N/A  Psychiatric Specialty Exam:  Presentation  General Appearance: Disheveled  Eye  Contact:None  Speech:Garbled; Pressured  Speech Volume:Increased  Handedness:Right   Mood and Affect  Mood:Dysphoric; Irritable  Affect:Inappropriate; Full Range   Thought Process  Thought Processes:Disorganized  Descriptions of Associations:Loose  Orientation:Partial  Thought Content:Illogical; Tangential; Scattered; Delusions; Paranoid Ideation  History of Schizophrenia/Schizoaffective disorder:Yes  Duration of Psychotic Symptoms:Greater than six months  Hallucinations:No data recorded Ideas of Reference:Paranoia  Suicidal Thoughts:No data recorded Homicidal Thoughts:No data recorded  Sensorium  Memory:Immediate Poor; Recent Poor; Remote Poor  Judgment:Impaired  Insight:Lacking   Executive Functions  Concentration:Poor  Attention Span:Poor  Recall:Poor  Fund of Knowledge:Poor  Language:Poor   Psychomotor Activity  Psychomotor Activity:No data recorded  Assets  Assets:Financial Resources/Insurance; Physical Health; Resilience; Social Support   Sleep  Sleep:No data recorded   Physical Exam: Physical Exam Vitals and nursing note reviewed.  Constitutional:      Appearance: Normal appearance.  HENT:     Head: Normocephalic and atraumatic.     Mouth/Throat:     Pharynx: Oropharynx is clear.  Eyes:     Pupils: Pupils are equal, round, and reactive to light.  Cardiovascular:     Rate and Rhythm: Normal rate and regular rhythm.  Pulmonary:     Effort: Pulmonary effort is normal.     Breath sounds: Normal breath sounds.  Abdominal:  General: Abdomen is flat.     Palpations: Abdomen is soft.  Musculoskeletal:        General: Normal range of motion.  Skin:    General: Skin is warm and dry.  Neurological:     General: No focal deficit present.     Mental Status: He is alert. Mental status is at baseline.  Psychiatric:        Attention and Perception: He is inattentive.        Mood and Affect: Mood normal. Affect is blunt.         Speech: He is noncommunicative. Speech is tangential.        Behavior: Behavior is agitated. Behavior is not aggressive.        Thought Content: Thought content is paranoid.        Cognition and Memory: Cognition is impaired.    Review of Systems  Constitutional: Negative.   HENT: Negative.    Eyes: Negative.   Respiratory: Negative.    Cardiovascular: Negative.   Gastrointestinal: Negative.   Musculoskeletal: Negative.   Skin: Negative.   Neurological: Negative.   Psychiatric/Behavioral:  Positive for hallucinations. The patient is nervous/anxious.    Blood pressure (!) 132/97, pulse 93, temperature 98 F (36.7 C), resp. rate 19, height 6\' 4"  (1.93 m), weight 109.3 kg, SpO2 100 %. Body mass index is 29.34 kg/m.   Treatment Plan Summary: Medication management and Plan patient remains very psychotic.  Obviously it takes time for antipsychotics to work but I am going to go ahead and increase his Abilify to 40 mg today rather than adding another agent.  Under the current regimen his sugars are under good control and Abilify is relatively safe from a cardiac standpoint.  No indication of any heart side effects.  Continue daily evaluation.  , MD 02/12/2022, 11:16 AM

## 2022-02-13 DIAGNOSIS — F200 Paranoid schizophrenia: Secondary | ICD-10-CM | POA: Diagnosis not present

## 2022-02-13 LAB — GLUCOSE, CAPILLARY
Glucose-Capillary: 79 mg/dL (ref 70–99)
Glucose-Capillary: 102 mg/dL — ABNORMAL HIGH (ref 70–99)
Glucose-Capillary: 157 mg/dL — ABNORMAL HIGH (ref 70–99)

## 2022-02-13 NOTE — Progress Notes (Signed)
[facility] MD Progress Note  02/13/2022 1:44 PM Derrick Atkins  MRN:  [id number] Subjective: Follow-up 51 year old man with schizophrenia.  His speech was slightly more coherent today.  I looked at his notes that he had written down on his worksheet this morning and they were pretty incoherent.  He repeats the same words multiple times on the line without completing a sentence.  Apparently his mother visited last night and he behaved in a psychotic way and became very agitated afterwards.  I tried to talk with him today about the idea of long-acting injectables but I am not sure he was following the conversation.  Seems to be tolerating medicine adequately at the moment Principal Problem: Schizophrenia, paranoid type (HCC) Diagnosis: Principal Problem:   Schizophrenia, paranoid type (HCC)  Total Time spent with patient: 30 minutes  Past Psychiatric History: Past history of schizophrenia  Past Medical History:  Past Medical History:  Diagnosis Date   Cerebral palsy (HCC)    Depression    Diabetes mellitus without complication (HCC)    Hypertension    Kidney stones    Schizoaffective disorder (HCC)    Scoliosis     Past Surgical History:  Procedure Laterality Date   BOWEL RESECTION     LAPAROTOMY N/A 05/15/2020   Procedure: EXPLORATORY LAPAROTOMY;  Surgeon: Henrene Dodge, MD;  Location: [facility] ORS;  Service: General;  Laterality: N/A;   Family History:  Family History  Problem Relation Age of Onset   Heart disease Father    Heart attack Father    Family Psychiatric  History: See previous Social History:  Social History   Substance and Sexual Activity  Alcohol Use No     Social History   Substance and Sexual Activity  Drug Use No    Social History   Socioeconomic History   Marital status: Single    Spouse name: Not on file   Number of children: Not on file   Years of education: Not on file   Highest education level: Not on file  Occupational History   Not on file  Tobacco  Use   Smoking status: Never   Smokeless tobacco: Never  Vaping Use   Vaping Use: Never used  Substance and Sexual Activity   Alcohol use: No   Drug use: No   Sexual activity: Not Currently  Other Topics Concern   Not on file  Social History Narrative   Not on file   Social Determinants of Health   Financial Resource Strain: Not on file  Food Insecurity: Not on file  Transportation Needs: Not on file  Physical Activity: Not on file  Stress: Not on file  Social Connections: Not on file   Additional Social History:                         Sleep: Fair  Appetite:  Fair  Current Medications: Current Facility-Administered Medications  Medication Dose Route Frequency Provider Last Rate Last Admin   acetaminophen (TYLENOL) tablet 650 mg  650 mg Oral Q6H PRN Charm Rings, NP   650 mg at 02/10/22 0005   alum & mag hydroxide-simeth (MAALOX/MYLANTA) 200-200-20 MG/5ML suspension 30 mL  30 mL Oral Q4H PRN Charm Rings, NP       ARIPiprazole (ABILIFY) tablet 40 mg  40 mg Oral Daily Nasreen Goedecke, [name], MD   40 mg at 02/13/22 9518   aspirin EC tablet 81 mg  81 mg Oral Daily Charm Rings,  NP   81 mg at 02/13/22 0803   benztropine (COGENTIN) tablet 1 mg  1 mg Oral Daily Maegan Buller T, MD   1 mg at 02/13/22 0803   clonazePAM (KLONOPIN) disintegrating tablet 0.5 mg  0.5 mg Oral BID PRN Jerard Bays T, MD   0.5 mg at 02/13/22 0902   fenofibrate tablet 54 mg  54 mg Oral Daily Charm Rings, NP   54 mg at 02/13/22 0805   gemfibrozil (LOPID) tablet 600 mg  600 mg Oral BID Charm Rings, NP   600 mg at 02/13/22 0805   haloperidol (HALDOL) tablet 5 mg  5 mg Oral Q6H PRN Zykeem Bauserman, [name], MD   5 mg at 02/12/22 1930   Or   haloperidol lactate (HALDOL) injection 5 mg  5 mg Intramuscular Q6H PRN Kamonte Mcmichen, [name], MD       hydrOXYzine (ATARAX) tablet 50 mg  50 mg Oral TID PRN Zakar Brosch, [name], MD   50 mg at 02/12/22 1931   insulin aspart (novoLOG) injection 0-15 Units  0-15 Units  Subcutaneous TID WC Lallie Strahm, [name], MD   2 Units at 02/10/22 0755   levothyroxine (SYNTHROID) tablet 88 mcg  88 mcg Oral Daily Charm Rings, NP   88 mcg at 02/13/22 0631   magnesium hydroxide (MILK OF MAGNESIA) suspension 30 mL  30 mL Oral Daily PRN Charm Rings, NP   30 mL at 02/10/22 0610   metFORMIN (GLUCOPHAGE) tablet 1,000 mg  1,000 mg Oral BID WC Charm Rings, NP   1,000 mg at 02/13/22 0803   ondansetron (ZOFRAN) tablet 4 mg  4 mg Oral Q6H PRN Sarina Ill, DO   4 mg at 02/06/22 1755   pioglitazone (ACTOS) tablet 45 mg  45 mg Oral Daily Charm Rings, NP   45 mg at 02/13/22 0805   simvastatin (ZOCOR) tablet 20 mg  20 mg Oral QHS Charm Rings, NP   20 mg at 02/11/22 2108   temazepam (RESTORIL) capsule 15 mg  15 mg Oral QHS PRN Kalecia Hartney, [name], MD   15 mg at 02/11/22 2108    Lab Results:  Results for orders placed or performed during the hospital encounter of 02/05/22 (from the past 48 hour(s))  Glucose, capillary     Status: None   Collection Time: 02/11/22  4:28 PM  Result Value Ref Range   Glucose-Capillary 92 70 - 99 mg/dL    Comment: Glucose reference range applies only to samples taken after fasting for at least 8 hours.  Glucose, capillary     Status: Abnormal   Collection Time: 02/11/22  7:49 PM  Result Value Ref Range   Glucose-Capillary 121 (H) 70 - 99 mg/dL    Comment: Glucose reference range applies only to samples taken after fasting for at least 8 hours.   Comment 1 Notify RN   Glucose, capillary     Status: None   Collection Time: 02/12/22  6:37 AM  Result Value Ref Range   Glucose-Capillary 89 70 - 99 mg/dL    Comment: Glucose reference range applies only to samples taken after fasting for at least 8 hours.  Glucose, capillary     Status: None   Collection Time: 02/12/22 11:40 AM  Result Value Ref Range   Glucose-Capillary 99 70 - 99 mg/dL    Comment: Glucose reference range applies only to samples taken after fasting for at least 8 hours.   Glucose, capillary  Status: None   Collection Time: 02/12/22  4:26 PM  Result Value Ref Range   Glucose-Capillary 79 70 - 99 mg/dL    Comment: Glucose reference range applies only to samples taken after fasting for at least 8 hours.  Glucose, capillary     Status: None   Collection Time: 02/13/22  7:37 AM  Result Value Ref Range   Glucose-Capillary 79 70 - 99 mg/dL    Comment: Glucose reference range applies only to samples taken after fasting for at least 8 hours.  Glucose, capillary     Status: Abnormal   Collection Time: 02/13/22 11:23 AM  Result Value Ref Range   Glucose-Capillary 102 (H) 70 - 99 mg/dL    Comment: Glucose reference range applies only to samples taken after fasting for at least 8 hours.    Blood Alcohol level:  Lab Results  Component Value Date   ETH <10 02/02/2022   ETH <10 01/18/2022    Metabolic Disorder Labs: Lab Results  Component Value Date   HGBA1C 7.0 (H) 02/08/2022   MPG 154.2 02/08/2022   MPG 116.89 05/13/2020   No results found for: "PROLACTIN" Lab Results  Component Value Date   CHOL 98 02/08/2022   TRIG 60 02/08/2022   HDL 44 02/08/2022   CHOLHDL 2.2 02/08/2022   VLDL 12 02/08/2022   LDLCALC 42 02/08/2022    Physical Findings: AIMS:  , ,  ,  ,    CIWA:    COWS:     Musculoskeletal: Strength & Muscle Tone: within normal limits Gait & Station: normal Patient leans: N/A  Psychiatric Specialty Exam:  Presentation  General Appearance: Disheveled  Eye Contact:None  Speech:Garbled; Pressured  Speech Volume:Increased  Handedness:Right   Mood and Affect  Mood:Dysphoric; Irritable  Affect:Inappropriate; Full Range   Thought Process  Thought Processes:Disorganized  Descriptions of Associations:Loose  Orientation:Partial  Thought Content:Illogical; Tangential; Scattered; Delusions; Paranoid Ideation  History of Schizophrenia/Schizoaffective disorder:Yes  Duration of Psychotic Symptoms:Greater than six  months  Hallucinations:No data recorded Ideas of Reference:Paranoia  Suicidal Thoughts:No data recorded Homicidal Thoughts:No data recorded  Sensorium  Memory:Immediate Poor; Recent Poor; Remote Poor  Judgment:Impaired  Insight:Lacking   Executive Functions  Concentration:Poor  Attention Span:Poor  Recall:Poor  Fund of Knowledge:Poor  Language:Poor   Psychomotor Activity  Psychomotor Activity:No data recorded  Assets  Assets:Financial Resources/Insurance; Physical Health; Resilience; Social Support   Sleep  Sleep:No data recorded   Physical Exam: Physical Exam Vitals and nursing note reviewed.  Constitutional:      Appearance: Normal appearance.  HENT:     Head: Normocephalic and atraumatic.     Mouth/Throat:     Pharynx: Oropharynx is clear.  Eyes:     Pupils: Pupils are equal, round, and reactive to light.  Cardiovascular:     Rate and Rhythm: Normal rate and regular rhythm.  Pulmonary:     Effort: Pulmonary effort is normal.     Breath sounds: Normal breath sounds.  Abdominal:     General: Abdomen is flat.     Palpations: Abdomen is soft.  Musculoskeletal:        General: Normal range of motion.  Skin:    General: Skin is warm and dry.  Neurological:     General: No focal deficit present.     Mental Status: He is alert. Mental status is at baseline.  Psychiatric:        Attention and Perception: He is inattentive.        Mood and Affect:  Mood normal. Affect is blunt.        Speech: Speech is delayed.        Behavior: Behavior is slowed.        Thought Content: Thought content is paranoid.        Cognition and Memory: Cognition is impaired.    Review of Systems  Constitutional: Negative.   HENT: Negative.    Eyes: Negative.   Respiratory: Negative.    Cardiovascular: Negative.   Gastrointestinal: Negative.   Musculoskeletal: Negative.   Skin: Negative.   Neurological: Negative.   Psychiatric/Behavioral:  Positive for depression and  hallucinations. The patient is nervous/anxious.    Blood pressure 137/74, pulse 92, temperature 97.8 F (36.6 C), temperature source Oral, resp. rate 18, height 6\' 4"  (1.93 m), weight 109.3 kg, SpO2 100 %. Body mass index is 29.34 kg/m.   Treatment Plan Summary: Medication management and Plan no change to current elevated dose of Abilify.  No cardiac complaints.  Supportive counseling and therapy.  Tomorrow I will talk with him about long-acting injectables again.  , MD 02/13/2022, 1:44 PM

## 2022-02-13 NOTE — Progress Notes (Signed)
Patient denies SI, HI, and AVH. He presents as anxious and preoccupied. Patient is mumbling while answering questions and is difficult to understand. Patient is compliant with scheduled medications. Klonopin PRN also given for anxiety at 902am. After breakfast, patient stands outside his room staring into the nurses station. Patient is not able to verbalize his concerns. Support and encouragement given. Patient remains safe on the unit at this time.

## 2022-02-13 NOTE — Progress Notes (Signed)
Patient intermittently comes back to the nurses station staring through the window. When asked if he needed help, patient states that he is worried he will not have a place to go and will need to stay at the hospital forever. Patient given reassurance and support.

## 2022-02-14 DIAGNOSIS — F200 Paranoid schizophrenia: Secondary | ICD-10-CM | POA: Diagnosis not present

## 2022-02-14 LAB — GLUCOSE, CAPILLARY
Glucose-Capillary: 81 mg/dL (ref 70–99)
Glucose-Capillary: 100 mg/dL — ABNORMAL HIGH (ref 70–99)
Glucose-Capillary: 118 mg/dL — ABNORMAL HIGH (ref 70–99)

## 2022-02-14 NOTE — [facility] Group Notes (Signed)
LCSW Wellness Group Note   02/14/2022 1:00pm  Type of Group and Topic: Psychoeducational Group:  Wellness  Participation Level: very limited  Description of Group  Wellness group introduces the topic and its focus on developing healthy habits across the spectrum and its relationship to a decrease in hospital admissions.  Six areas of wellness are discussed: physical, social spiritual, intellectual, occupational, and emotional.  Patients are asked to consider their current wellness habits and to identify areas of wellness where they are interested and able to focus on improvements.    Therapeutic Goals Patients will understand components of wellness and how they can positively impact overall health.  Patients will identify areas of wellness where they have developed good habits. Patients will identify areas of wellness where they would like to make improvements.    Summary of Patient Progress: Pt attended the entire group but was not able to participate.  Pt was not able to answer when CSW called on him several times.  Appropriate behavior but did not appear able to engage with the discussion.

## 2022-02-14 NOTE — Plan of Care (Signed)
Met with PT in medication room. Pt is baseline confused and is only oriented to person. Pt is frequently seen in hallway looking lost and has to be redirected by staff. Pt is easily redirected and follows commands. Denies SI / HI / AVH. Staff will continue to round q 15 for safety.    Problem: Education: Goal: Knowledge of General Education information will improve Description: Including pain rating scale, medication(s)/side effects and non-pharmacologic comfort measures Outcome: Not Progressing   Problem: Health Behavior/Discharge Planning: Goal: Ability to manage health-related needs will improve Outcome: Not Progressing

## 2022-02-14 NOTE — Progress Notes (Signed)
[facility] MD Progress Note  02/14/2022 12:08 PM Derrick Atkins  MRN:  [id number] Subjective: Patient seen and chart reviewed.  He continues to be psychotic and disorganized.  During interview makes little eye contact and speaks in a very low tone and mumbling rapidly to himself.  He is redirectable a little bit and to the extent that I could understand him seems to be complaining about not having his cell phone with him.  Not really interacting with others on the unit.  Seems disorganized much of the time.  Some EPS possibly noted but does not appear to be having akathisia.  Slept a little better.  Not as aggressive.  Still on a higher dose Abilify and does not seem to be ready to have a conversation about long-acting injectable Principal Problem: Schizophrenia, paranoid type (HCC) Diagnosis: Principal Problem:   Schizophrenia, paranoid type (HCC)  Total Time spent with patient: 30 minutes  Past Psychiatric History: Past history of schizophrenia  Past Medical History:  Past Medical History:  Diagnosis Date   Cerebral palsy (HCC)    Depression    Diabetes mellitus without complication (HCC)    Hypertension    Kidney stones    Schizoaffective disorder (HCC)    Scoliosis     Past Surgical History:  Procedure Laterality Date   BOWEL RESECTION     LAPAROTOMY N/A 05/15/2020   Procedure: EXPLORATORY LAPAROTOMY;  Surgeon: Henrene Dodge, MD;  Location: [facility] ORS;  Service: General;  Laterality: N/A;   Family History:  Family History  Problem Relation Age of Onset   Heart disease Father    Heart attack Father    Family Psychiatric  History: See previous Social History:  Social History   Substance and Sexual Activity  Alcohol Use No     Social History   Substance and Sexual Activity  Drug Use No    Social History   Socioeconomic History   Marital status: Single    Spouse name: Not on file   Number of children: Not on file   Years of education: Not on file   Highest education  level: Not on file  Occupational History   Not on file  Tobacco Use   Smoking status: Never   Smokeless tobacco: Never  Vaping Use   Vaping Use: Never used  Substance and Sexual Activity   Alcohol use: No   Drug use: No   Sexual activity: Not Currently  Other Topics Concern   Not on file  Social History Narrative   Not on file   Social Determinants of Health   Financial Resource Strain: Not on file  Food Insecurity: Not on file  Transportation Needs: Not on file  Physical Activity: Not on file  Stress: Not on file  Social Connections: Not on file   Additional Social History:                         Sleep: Fair  Appetite:  Fair  Current Medications: Current Facility-Administered Medications  Medication Dose Route Frequency Provider Last Rate Last Admin   acetaminophen (TYLENOL) tablet 650 mg  650 mg Oral Q6H PRN Charm Rings, NP   650 mg at 02/10/22 0005   alum & mag hydroxide-simeth (MAALOX/MYLANTA) 200-200-20 MG/5ML suspension 30 mL  30 mL Oral Q4H PRN Charm Rings, NP       ARIPiprazole (ABILIFY) tablet 40 mg  40 mg Oral Daily Venisha Boehning, [name], MD   40 mg at  02/14/22 0754   aspirin EC tablet 81 mg  81 mg Oral Daily Charm Rings, NP   81 mg at 02/14/22 0754   benztropine (COGENTIN) tablet 1 mg  1 mg Oral Daily Zenobia Kuennen T, MD   1 mg at 02/14/22 0754   clonazePAM (KLONOPIN) disintegrating tablet 0.5 mg  0.5 mg Oral BID PRN Bristyl Mclees T, MD   0.5 mg at 02/13/22 2133   fenofibrate tablet 54 mg  54 mg Oral Daily Charm Rings, NP   54 mg at 02/14/22 0755   gemfibrozil (LOPID) tablet 600 mg  600 mg Oral BID Charm Rings, NP   600 mg at 02/14/22 0755   haloperidol (HALDOL) tablet 5 mg  5 mg Oral Q6H PRN Emilee Market, [name], MD   5 mg at 02/13/22 2133   Or   haloperidol lactate (HALDOL) injection 5 mg  5 mg Intramuscular Q6H PRN Darriel Utter, [name], MD       hydrOXYzine (ATARAX) tablet 50 mg  50 mg Oral TID PRN Rendy Lazard, [name], MD   50 mg at 02/13/22  2133   insulin aspart (novoLOG) injection 0-15 Units  0-15 Units Subcutaneous TID WC Dash Cardarelli, [name], MD   3 Units at 02/13/22 1622   levothyroxine (SYNTHROID) tablet 88 mcg  88 mcg Oral Daily Charm Rings, NP   88 mcg at 02/13/22 0631   magnesium hydroxide (MILK OF MAGNESIA) suspension 30 mL  30 mL Oral Daily PRN Charm Rings, NP   30 mL at 02/10/22 0610   metFORMIN (GLUCOPHAGE) tablet 1,000 mg  1,000 mg Oral BID WC Charm Rings, NP   1,000 mg at 02/14/22 0754   ondansetron (ZOFRAN) tablet 4 mg  4 mg Oral Q6H PRN Sarina Ill, DO   4 mg at 02/06/22 1755   pioglitazone (ACTOS) tablet 45 mg  45 mg Oral Daily Charm Rings, NP   45 mg at 02/14/22 0755   simvastatin (ZOCOR) tablet 20 mg  20 mg Oral QHS Charm Rings, NP   20 mg at 02/13/22 2134   temazepam (RESTORIL) capsule 15 mg  15 mg Oral QHS PRN [place], [name], MD   15 mg at 02/13/22 2133    Lab Results:  Results for orders placed or performed during the hospital encounter of 02/05/22 (from the past 48 hour(s))  Glucose, capillary     Status: None   Collection Time: 02/12/22  4:26 PM  Result Value Ref Range   Glucose-Capillary 79 70 - 99 mg/dL    Comment: Glucose reference range applies only to samples taken after fasting for at least 8 hours.  Glucose, capillary     Status: None   Collection Time: 02/13/22  7:37 AM  Result Value Ref Range   Glucose-Capillary 79 70 - 99 mg/dL    Comment: Glucose reference range applies only to samples taken after fasting for at least 8 hours.  Glucose, capillary     Status: Abnormal   Collection Time: 02/13/22 11:23 AM  Result Value Ref Range   Glucose-Capillary 102 (H) 70 - 99 mg/dL    Comment: Glucose reference range applies only to samples taken after fasting for at least 8 hours.  Glucose, capillary     Status: Abnormal   Collection Time: 02/13/22  4:17 PM  Result Value Ref Range   Glucose-Capillary 157 (H) 70 - 99 mg/dL    Comment: Glucose reference range applies only  to samples taken after  fasting for at least 8 hours.  Glucose, capillary     Status: None   Collection Time: 02/14/22  7:51 AM  Result Value Ref Range   Glucose-Capillary 81 70 - 99 mg/dL    Comment: Glucose reference range applies only to samples taken after fasting for at least 8 hours.  Glucose, capillary     Status: Abnormal   Collection Time: 02/14/22 11:32 AM  Result Value Ref Range   Glucose-Capillary 100 (H) 70 - 99 mg/dL    Comment: Glucose reference range applies only to samples taken after fasting for at least 8 hours.    Blood Alcohol level:  Lab Results  Component Value Date   ETH <10 02/02/2022   ETH <10 01/18/2022    Metabolic Disorder Labs: Lab Results  Component Value Date   HGBA1C 7.0 (H) 02/08/2022   MPG 154.2 02/08/2022   MPG 116.89 05/13/2020   No results found for: "PROLACTIN" Lab Results  Component Value Date   CHOL 98 02/08/2022   TRIG 60 02/08/2022   HDL 44 02/08/2022   CHOLHDL 2.2 02/08/2022   VLDL 12 02/08/2022   LDLCALC 42 02/08/2022    Physical Findings: AIMS:  , ,  ,  ,    CIWA:    COWS:     Musculoskeletal: Strength & Muscle Tone: within normal limits Gait & Station: normal Patient leans: N/A  Psychiatric Specialty Exam:  Presentation  General Appearance: Disheveled  Eye Contact:None  Speech:Garbled; Pressured  Speech Volume:Increased  Handedness:Right   Mood and Affect  Mood:Dysphoric; Irritable  Affect:Inappropriate; Full Range   Thought Process  Thought Processes:Disorganized  Descriptions of Associations:Loose  Orientation:Partial  Thought Content:Illogical; Tangential; Scattered; Delusions; Paranoid Ideation  History of Schizophrenia/Schizoaffective disorder:Yes  Duration of Psychotic Symptoms:Greater than six months  Hallucinations:No data recorded Ideas of Reference:Paranoia  Suicidal Thoughts:No data recorded Homicidal Thoughts:No data recorded  Sensorium  Memory:Immediate Poor; Recent Poor;  Remote Poor  Judgment:Impaired  Insight:Lacking   Executive Functions  Concentration:Poor  Attention Span:Poor  Recall:Poor  Fund of Knowledge:Poor  Language:Poor   Psychomotor Activity  Psychomotor Activity:No data recorded  Assets  Assets:Financial Resources/Insurance; Physical Health; Resilience; Social Support   Sleep  Sleep:No data recorded   Physical Exam: Physical Exam Vitals and nursing note reviewed.  Constitutional:      Appearance: Normal appearance.  HENT:     Head: Normocephalic and atraumatic.     Mouth/Throat:     Pharynx: Oropharynx is clear.  Eyes:     Pupils: Pupils are equal, round, and reactive to light.  Cardiovascular:     Rate and Rhythm: Normal rate and regular rhythm.  Pulmonary:     Effort: Pulmonary effort is normal.     Breath sounds: Normal breath sounds.  Abdominal:     General: Abdomen is flat.     Palpations: Abdomen is soft.  Musculoskeletal:        General: Normal range of motion.  Skin:    General: Skin is warm and dry.  Neurological:     General: No focal deficit present.     Mental Status: He is alert. Mental status is at baseline.  Psychiatric:        Attention and Perception: He is inattentive.        Mood and Affect: Mood normal. Affect is blunt.        Speech: He is noncommunicative.        Behavior: Behavior is slowed.    Review of Systems  Constitutional: Negative.  HENT: Negative.    Eyes: Negative.   Respiratory: Negative.    Cardiovascular: Negative.   Gastrointestinal: Negative.   Musculoskeletal: Negative.   Skin: Negative.   Neurological: Negative.   Psychiatric/Behavioral:  The patient is nervous/anxious.    Blood pressure (!) 151/80, pulse 93, temperature 97.7 F (36.5 C), temperature source Oral, resp. rate 18, height 6\' 4"  (1.93 m), weight 109.3 kg, SpO2 100 %. Body mass index is 29.34 kg/m.   Treatment Plan Summary: Plan no change to medicine for today.  Continue monitoring on the  unit.  Transient blood pressure elevated today but we will continue to monitor that before making any changes to medicine.  , MD 02/14/2022, 12:08 PM

## 2022-02-15 DIAGNOSIS — F200 Paranoid schizophrenia: Secondary | ICD-10-CM | POA: Diagnosis not present

## 2022-02-15 LAB — GLUCOSE, CAPILLARY
Glucose-Capillary: 89 mg/dL (ref 70–99)
Glucose-Capillary: 91 mg/dL (ref 70–99)
Glucose-Capillary: 105 mg/dL — ABNORMAL HIGH (ref 70–99)
Glucose-Capillary: 190 mg/dL — ABNORMAL HIGH (ref 70–99)

## 2022-02-15 NOTE — Progress Notes (Signed)
Patient alert and oriented x 3 with periods of confusion to situation , affect is flat but brightens upon approach. 15 minutes safety checks maintained will continue to monitor.

## 2022-02-15 NOTE — Progress Notes (Signed)
[facility] MD Progress Note  02/15/2022 6:13 PM Derrick Atkins  MRN:  [id number] Subjective: No new complaints.  Chronic psychosis hallucinations disorganized thinking Principal Problem: Schizophrenia, paranoid type (HCC) Diagnosis: Principal Problem:   Schizophrenia, paranoid type (HCC)  Total Time spent with patient: 30 minutes  Past Psychiatric History: Past history of schizophrenia  Past Medical History:  Past Medical History:  Diagnosis Date   Cerebral palsy (HCC)    Depression    Diabetes mellitus without complication (HCC)    Hypertension    Kidney stones    Schizoaffective disorder (HCC)    Scoliosis     Past Surgical History:  Procedure Laterality Date   BOWEL RESECTION     LAPAROTOMY N/A 05/15/2020   Procedure: EXPLORATORY LAPAROTOMY;  Surgeon: Henrene Dodge, MD;  Location: [facility] ORS;  Service: General;  Laterality: N/A;   Family History:  Family History  Problem Relation Age of Onset   Heart disease Father    Heart attack Father    Family Psychiatric  History: See previous Social History:  Social History   Substance and Sexual Activity  Alcohol Use No     Social History   Substance and Sexual Activity  Drug Use No    Social History   Socioeconomic History   Marital status: Single    Spouse name: Not on file   Number of children: Not on file   Years of education: Not on file   Highest education level: Not on file  Occupational History   Not on file  Tobacco Use   Smoking status: Never   Smokeless tobacco: Never  Vaping Use   Vaping Use: Never used  Substance and Sexual Activity   Alcohol use: No   Drug use: No   Sexual activity: Not Currently  Other Topics Concern   Not on file  Social History Narrative   Not on file   Social Determinants of Health   Financial Resource Strain: Not on file  Food Insecurity: Not on file  Transportation Needs: Not on file  Physical Activity: Not on file  Stress: Not on file  Social Connections: Not on file    Additional Social History:                         Sleep: Fair  Appetite:  Fair  Current Medications: Current Facility-Administered Medications  Medication Dose Route Frequency Provider Last Rate Last Admin   acetaminophen (TYLENOL) tablet 650 mg  650 mg Oral Q6H PRN Charm Rings, NP   650 mg at 02/10/22 0005   alum & mag hydroxide-simeth (MAALOX/MYLANTA) 200-200-20 MG/5ML suspension 30 mL  30 mL Oral Q4H PRN Charm Rings, NP       ARIPiprazole (ABILIFY) tablet 40 mg  40 mg Oral Daily Raseel Jans T, MD   40 mg at 02/15/22 0800   aspirin EC tablet 81 mg  81 mg Oral Daily Charm Rings, NP   81 mg at 02/15/22 0801   benztropine (COGENTIN) tablet 1 mg  1 mg Oral Daily Charmayne Odell T, MD   1 mg at 02/15/22 0801   clonazePAM (KLONOPIN) disintegrating tablet 0.5 mg  0.5 mg Oral BID PRN Ivelis Norgard T, MD   0.5 mg at 02/15/22 5621   fenofibrate tablet 54 mg  54 mg Oral Daily Charm Rings, NP   54 mg at 02/15/22 0801   gemfibrozil (LOPID) tablet 600 mg  600 mg Oral BID Charm Rings,  NP   600 mg at 02/15/22 1641   haloperidol (HALDOL) tablet 5 mg  5 mg Oral Q6H PRN Derisha Funderburke T, MD   5 mg at 02/13/22 2133   Or   haloperidol lactate (HALDOL) injection 5 mg  5 mg Intramuscular Q6H PRN Caden Fatica, [name], MD       hydrOXYzine (ATARAX) tablet 50 mg  50 mg Oral TID PRN Naamah Boggess T, MD   50 mg at 02/13/22 2133   insulin aspart (novoLOG) injection 0-15 Units  0-15 Units Subcutaneous TID WC Corinthian Mizrahi, [name], MD   3 Units at 02/13/22 1622   levothyroxine (SYNTHROID) tablet 88 mcg  88 mcg Oral Daily Charm Rings, NP   88 mcg at 02/13/22 0631   magnesium hydroxide (MILK OF MAGNESIA) suspension 30 mL  30 mL Oral Daily PRN Charm Rings, NP   30 mL at 02/10/22 0610   metFORMIN (GLUCOPHAGE) tablet 1,000 mg  1,000 mg Oral BID WC Charm Rings, NP   1,000 mg at 02/15/22 1641   ondansetron (ZOFRAN) tablet 4 mg  4 mg Oral Q6H PRN Sarina Ill, DO   4 mg at 02/06/22  1755   pioglitazone (ACTOS) tablet 45 mg  45 mg Oral Daily Charm Rings, NP   45 mg at 02/15/22 0801   simvastatin (ZOCOR) tablet 20 mg  20 mg Oral QHS Charm Rings, NP   20 mg at 02/14/22 2104   temazepam (RESTORIL) capsule 15 mg  15 mg Oral QHS PRN Renny Gunnarson, [name], MD   15 mg at 02/14/22 2104    Lab Results:  Results for orders placed or performed during the hospital encounter of 02/05/22 (from the past 48 hour(s))  Glucose, capillary     Status: None   Collection Time: 02/14/22  7:51 AM  Result Value Ref Range   Glucose-Capillary 81 70 - 99 mg/dL    Comment: Glucose reference range applies only to samples taken after fasting for at least 8 hours.  Glucose, capillary     Status: Abnormal   Collection Time: 02/14/22 11:32 AM  Result Value Ref Range   Glucose-Capillary 100 (H) 70 - 99 mg/dL    Comment: Glucose reference range applies only to samples taken after fasting for at least 8 hours.  Glucose, capillary     Status: Abnormal   Collection Time: 02/14/22  4:26 PM  Result Value Ref Range   Glucose-Capillary 118 (H) 70 - 99 mg/dL    Comment: Glucose reference range applies only to samples taken after fasting for at least 8 hours.  Glucose, capillary     Status: None   Collection Time: 02/15/22  6:52 AM  Result Value Ref Range   Glucose-Capillary 89 70 - 99 mg/dL    Comment: Glucose reference range applies only to samples taken after fasting for at least 8 hours.  Glucose, capillary     Status: None   Collection Time: 02/15/22 11:52 AM  Result Value Ref Range   Glucose-Capillary 91 70 - 99 mg/dL    Comment: Glucose reference range applies only to samples taken after fasting for at least 8 hours.  Glucose, capillary     Status: Abnormal   Collection Time: 02/15/22  4:35 PM  Result Value Ref Range   Glucose-Capillary 105 (H) 70 - 99 mg/dL    Comment: Glucose reference range applies only to samples taken after fasting for at least 8 hours.    Blood Alcohol level:  Lab  Results  Component Value Date   ETH <10 02/02/2022   ETH <10 01/18/2022    Metabolic Disorder Labs: Lab Results  Component Value Date   HGBA1C 7.0 (H) 02/08/2022   MPG 154.2 02/08/2022   MPG 116.89 05/13/2020   No results found for: "PROLACTIN" Lab Results  Component Value Date   CHOL 98 02/08/2022   TRIG 60 02/08/2022   HDL 44 02/08/2022   CHOLHDL 2.2 02/08/2022   VLDL 12 02/08/2022   LDLCALC 42 02/08/2022    Physical Findings: AIMS:  , ,  ,  ,    CIWA:    COWS:     Musculoskeletal: Strength & Muscle Tone: within normal limits Gait & Station: normal Patient leans: N/A  Psychiatric Specialty Exam:  Presentation  General Appearance: Disheveled  Eye Contact:None  Speech:Garbled; Pressured  Speech Volume:Increased  Handedness:Right   Mood and Affect  Mood:Dysphoric; Irritable  Affect:Inappropriate; Full Range   Thought Process  Thought Processes:Disorganized  Descriptions of Associations:Loose  Orientation:Partial  Thought Content:Illogical; Tangential; Scattered; Delusions; Paranoid Ideation  History of Schizophrenia/Schizoaffective disorder:Yes  Duration of Psychotic Symptoms:Greater than six months  Hallucinations:No data recorded Ideas of Reference:Paranoia  Suicidal Thoughts:No data recorded Homicidal Thoughts:No data recorded  Sensorium  Memory:Immediate Poor; Recent Poor; Remote Poor  Judgment:Impaired  Insight:Lacking   Executive Functions  Concentration:Poor  Attention Span:Poor  Recall:Poor  Fund of Knowledge:Poor  Language:Poor   Psychomotor Activity  Psychomotor Activity:No data recorded  Assets  Assets:Financial Resources/Insurance; Physical Health; Resilience; Social Support   Sleep  Sleep:No data recorded   Physical Exam: Physical Exam Vitals and nursing note reviewed.  Constitutional:      Appearance: Normal appearance.  HENT:     Head: Normocephalic and atraumatic.     Mouth/Throat:      Pharynx: Oropharynx is clear.  Eyes:     Pupils: Pupils are equal, round, and reactive to light.  Cardiovascular:     Rate and Rhythm: Normal rate and regular rhythm.  Pulmonary:     Effort: Pulmonary effort is normal.     Breath sounds: Normal breath sounds.  Abdominal:     General: Abdomen is flat.     Palpations: Abdomen is soft.  Musculoskeletal:        General: Normal range of motion.  Skin:    General: Skin is warm and dry.  Neurological:     General: No focal deficit present.     Mental Status: He is alert. Mental status is at baseline.  Psychiatric:        Attention and Perception: He is inattentive.        Mood and Affect: Mood normal. Affect is blunt.        Speech: He is noncommunicative. Speech is tangential.        Behavior: Behavior is slowed.        Thought Content: Thought content normal.        Cognition and Memory: Cognition is impaired.    Review of Systems  Constitutional: Negative.   HENT: Negative.    Eyes: Negative.   Respiratory: Negative.    Cardiovascular: Negative.   Gastrointestinal: Negative.   Musculoskeletal: Negative.   Skin: Negative.   Neurological: Negative.   Psychiatric/Behavioral:  Positive for hallucinations.    Blood pressure 138/78, pulse 96, temperature 98 F (36.7 C), temperature source Oral, resp. rate 18, height 6\' 4"  (1.93 m), weight 109.3 kg, SpO2 100 %. Body mass index is 29.34 kg/m.   Treatment Plan Summary:  Medication management and Plan continue Abilify.  Encourage group attendance.  Continue daily assessment of mental state.  Mordecai Rasmussen, MD 02/15/2022, 6:13 PM
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
## 2022-02-15 NOTE — Progress Notes (Signed)
Patient assessed in the hallway. He is standing outside of his room looking into the nurses station window. When asked if he needed anything, patient stares blankly and does not answer. Patient appears to be responding to internal stimuli. After some time, he sat down in the chair outside of the medication and was dozing off to sleep. Patient encouraged to return to his room to sleep, but patient refused. He is compliant with scheduled medications after much encouragement. Patient remains safe on the unit at this time.

## 2022-02-15 NOTE — BH IP Treatment Plan (Signed)
Interdisciplinary Treatment and Diagnostic Plan Update  02/15/2022 Time of Session: 8:30 AM Derrick Atkins MRN: [id number]  Principal Diagnosis: Schizophrenia, paranoid type [facility])  Secondary Diagnoses: Principal Problem:   Schizophrenia, paranoid type (HCC)   Current Medications:  Current Facility-Administered Medications  Medication Dose Route Frequency Provider Last Rate Last Admin   acetaminophen (TYLENOL) tablet 650 mg  650 mg Oral Q6H PRN Charm Rings, NP   650 mg at 02/10/22 0005   alum & mag hydroxide-simeth (MAALOX/MYLANTA) 200-200-20 MG/5ML suspension 30 mL  30 mL Oral Q4H PRN Charm Rings, NP       ARIPiprazole (ABILIFY) tablet 40 mg  40 mg Oral Daily [name], [name], MD   40 mg at 02/15/22 0800   aspirin EC tablet 81 mg  81 mg Oral Daily Charm Rings, NP   81 mg at 02/15/22 0801   benztropine (COGENTIN) tablet 1 mg  1 mg Oral Daily [name], [name], MD   1 mg at 02/15/22 0801   clonazePAM (KLONOPIN) disintegrating tablet 0.5 mg  0.5 mg Oral BID PRN [name], [name], MD   0.5 mg at 02/15/22 0175   fenofibrate tablet 54 mg  54 mg Oral Daily Charm Rings, NP   54 mg at 02/15/22 0801   gemfibrozil (LOPID) tablet 600 mg  600 mg Oral BID Charm Rings, NP   600 mg at 02/15/22 0801   haloperidol (HALDOL) tablet 5 mg  5 mg Oral Q6H PRN [name], [name], MD   5 mg at 02/13/22 2133   Or   haloperidol lactate (HALDOL) injection 5 mg  5 mg Intramuscular Q6H PRN [name], [name], MD       hydrOXYzine (ATARAX) tablet 50 mg  50 mg Oral TID PRN [name], [name], MD   50 mg at 02/13/22 2133   insulin aspart (novoLOG) injection 0-15 Units  0-15 Units Subcutaneous TID WC [name], [name], MD   3 Units at 02/13/22 1622   levothyroxine (SYNTHROID) tablet 88 mcg  88 mcg Oral Daily Charm Rings, NP   88 mcg at 02/13/22 0631   magnesium hydroxide (MILK OF MAGNESIA) suspension 30 mL  30 mL Oral Daily PRN Charm Rings, NP   30 mL at 02/10/22 0610   metFORMIN (GLUCOPHAGE) tablet 1,000 mg   1,000 mg Oral BID WC Charm Rings, NP   1,000 mg at 02/15/22 0801   ondansetron (ZOFRAN) tablet 4 mg  4 mg Oral Q6H PRN Sarina Ill, DO   4 mg at 02/06/22 1755   pioglitazone (ACTOS) tablet 45 mg  45 mg Oral Daily Charm Rings, NP   45 mg at 02/15/22 0801   simvastatin (ZOCOR) tablet 20 mg  20 mg Oral QHS Charm Rings, NP   20 mg at 02/14/22 2104   temazepam (RESTORIL) capsule 15 mg  15 mg Oral QHS PRN [name], [name], MD   15 mg at 02/14/22 2104   PTA Medications: Medications Prior to Admission  Medication Sig Dispense Refill Last Dose   ARIPiprazole (ABILIFY) 10 MG tablet Take 10 mg by mouth daily.      ARIPiprazole (ABILIFY) 20 MG tablet Take 1 tablet by mouth daily.  (Patient not taking: Reported on 02/02/2022)      aspirin 81 MG EC tablet Take 1 tablet by mouth daily.      buPROPion (WELLBUTRIN XL) 300 MG 24 hr tablet Take 300 mg by mouth daily. (Patient not taking: Reported  on 02/02/2022)      buPROPion (WELLBUTRIN) 75 MG tablet Take 75 mg by mouth 2 (two) times daily.      cyclobenzaprine (FLEXERIL) 5 MG tablet Take 1 tablet (5 mg total) by mouth at bedtime. (Patient not taking: Reported on 01/18/2022) 30 tablet 0    divalproex (DEPAKOTE ER) 500 MG 24 hr tablet Take 1,000 mg by mouth at bedtime. (Patient not taking: Reported on 02/02/2022)      divalproex (DEPAKOTE) 500 MG DR tablet Take 500 mg by mouth 2 (two) times daily.      gemfibrozil (LOPID) 600 MG tablet Take 600 mg by mouth 2 (two) times daily.      Insulin Lispro Prot & Lispro (HUMALOG 75/25 MIX) (75-25) 100 UNIT/ML Kwikpen Inject into the skin.      levothyroxine (SYNTHROID, LEVOTHROID) 88 MCG tablet Take 88 mcg by mouth daily.      lidocaine (LIDODERM) 5 % Place 1 patch onto the skin every 12 (twelve) hours. Remove & Discard patch within 12 hours or as directed by MD (Patient not taking: Reported on 01/18/2022) 15 patch 0    lisinopril (PRINIVIL,ZESTRIL) 10 MG tablet Take 1 tablet by mouth daily.       metFORMIN (GLUCOPHAGE) 1000 MG tablet Take 1,000 mg by mouth 2 (two) times daily with a meal.      metoprolol succinate (TOPROL-XL) 50 MG 24 hr tablet Take 50 mg by mouth daily. (Patient not taking: Reported on 02/02/2022)      naproxen (NAPROSYN) 500 MG tablet Take 1 tablet (500 mg total) by mouth 2 (two) times daily with a meal. (Patient not taking: Reported on 01/18/2022) 20 tablet 0    NOVOLOG MIX 70/30 FLEXPEN (70-30) 100 UNIT/ML FlexPen Inject 34-44 Units into the skin 2 (two) times daily. 34 units-am 44units-pm      pioglitazone (ACTOS) 45 MG tablet Take 45 mg by mouth daily.      polyethylene glycol (MIRALAX / GLYCOLAX) 17 g packet Take 17 g by mouth daily. (Patient not taking: Reported on 08/30/2021) 14 each 0    simvastatin (ZOCOR) 20 MG tablet Take 20 mg by mouth at bedtime.       Patient Stressors: Financial difficulties   Health problems    Patient Strengths: Motivation for treatment/growth   Treatment Modalities: Medication Management, Group therapy, Case management,  1 to 1 session with clinician, Psychoeducation, Recreational therapy.   Physician Treatment Plan for Primary Diagnosis: Schizophrenia, paranoid type (HCC) Long Term Goal(s): Improvement in symptoms so as ready for discharge   Short Term Goals: Ability to identify changes in lifestyle to reduce recurrence of condition will improve Ability to verbalize feelings will improve Ability to disclose and discuss suicidal ideas Ability to demonstrate self-control will improve Ability to identify and develop effective coping behaviors will improve Ability to maintain clinical measurements within normal limits will improve Compliance with prescribed medications will improve Ability to identify triggers associated with substance abuse/mental health issues will improve  Medication Management: Evaluate patient's response, side effects, and tolerance of medication regimen.  Therapeutic Interventions: 1 to 1 sessions, Unit  Group sessions and Medication administration.  Evaluation of Outcomes: Progressing  Physician Treatment Plan for Secondary Diagnosis: Principal Problem:   Schizophrenia, paranoid type (HCC)  Long Term Goal(s): Improvement in symptoms so as ready for discharge   Short Term Goals: Ability to identify changes in lifestyle to reduce recurrence of condition will improve Ability to verbalize feelings will improve Ability to disclose and discuss suicidal ideas  Ability to demonstrate self-control will improve Ability to identify and develop effective coping behaviors will improve Ability to maintain clinical measurements within normal limits will improve Compliance with prescribed medications will improve Ability to identify triggers associated with substance abuse/mental health issues will improve     Medication Management: Evaluate patient's response, side effects, and tolerance of medication regimen.  Therapeutic Interventions: 1 to 1 sessions, Unit Group sessions and Medication administration.  Evaluation of Outcomes: Progressing   RN Treatment Plan for Primary Diagnosis: Schizophrenia, paranoid type (HCC) Long Term Goal(s): Knowledge of disease and therapeutic regimen to maintain health will improve  Short Term Goals: Ability to remain free from injury will improve, Ability to verbalize frustration and anger appropriately will improve, Ability to demonstrate self-control, Ability to participate in decision making will improve, Ability to verbalize feelings will improve, Ability to disclose and discuss suicidal ideas, Ability to identify and develop effective coping behaviors will improve, and Compliance with prescribed medications will improve  Medication Management: RN will administer medications as ordered by provider, will assess and evaluate patient's response and provide education to patient for prescribed medication. RN will report any adverse and/or side effects to prescribing  provider.  Therapeutic Interventions: 1 on 1 counseling sessions, Psychoeducation, Medication administration, Evaluate responses to treatment, Monitor vital signs and CBGs as ordered, Perform/monitor CIWA, COWS, AIMS and Fall Risk screenings as ordered, Perform wound care treatments as ordered.  Evaluation of Outcomes: Progressing   LCSW Treatment Plan for Primary Diagnosis: Schizophrenia, paranoid type (HCC) Long Term Goal(s): Safe transition to appropriate next level of care at discharge, Engage patient in therapeutic group addressing interpersonal concerns.  Short Term Goals: Engage patient in aftercare planning with referrals and resources, Increase social support, Increase ability to appropriately verbalize feelings, Increase emotional regulation, Facilitate acceptance of mental health diagnosis and concerns, and Increase skills for wellness and recovery  Therapeutic Interventions: Assess for all discharge needs, 1 to 1 time with Social worker, Explore available resources and support systems, Assess for adequacy in community support network, Educate family and significant other(s) on suicide prevention, Complete Psychosocial Assessment, Interpersonal group therapy.  Evaluation of Outcomes: Progressing   Progress in Treatment: Attending groups: Yes. Participating in groups: Yes. and No. Taking medication as prescribed: Yes. Toleration medication: Yes. Family/Significant other contact made: Yes, individual(s) contacted:  SPE completed with patient's guardian. Patient understands diagnosis: No. Discussing patient identified problems/goals with staff: No. Medical problems stabilized or resolved: Yes. Denies suicidal/homicidal ideation: No. Issues/concerns per patient self-inventory: No. Other: none.  New problem(s) identified: No, Describe:  None Update 02/15/22: No changes at this time.   New Short Term/Long Term Goal(s): Patient to work towards elimination of symptoms of psychosis,  medication management for mood stabilization; development of comprehensive mental wellness plan. Update 02/15/22: No changes at this time.    Patient Goals:  Patient was unable to provide goals during treatment team. Update 02/15/22: No changes at this time.   Discharge Plan or Barriers:  CSW will assist pt with development of appropriate discharge/aftercare plan. Update 02/15/22: No changes at this time.   Reason for Continuation of Hospitalization: Delusions  Hallucinations Mania Medication stabilization   Estimated Length of Stay:  TBD Update 02/15/22: No changes at this time.  Last 3 [place] Suicide Severity Risk Score: Flowsheet Row Admission (Current) from 02/05/2022 in [facility] INPATIENT BEHAV[facility]AL MEDICINE ED from 02/02/2022 in [facility] EMERGENCY DEPARTMENT ED from [facility]/18/2022 in [facility]  C-SSRS RISK CATEGORY No Risk No  Risk No Risk       Last PHQ 2/9 Scores:     No data to display          Scribe for Treatment Team: Glenis Smoker, LCSW 02/15/2022 9:21 AM

## 2022-02-15 NOTE — Progress Notes (Signed)
Recreation Therapy Notes  Date: 02/15/2022   Time: 10:50 am     Location: Craft room    Behavioral response: N/A   Intervention Topic: Self-care    Discussion/Intervention: Patient refused to attend group.    Clinical Observations/Feedback:  Patient refused to attend group.    Nelma Phagan LRT/CTRS        Makayleigh Poliquin 02/15/2022 11:36 AM

## 2022-02-15 NOTE — Group Note (Signed)
[facility] LCSW Group Therapy Note    Group Date: 02/15/2022 Start Time: 1330 End Time: 1430  Type of Therapy and Topic:  Group Therapy:  Overcoming Obstacles  Participation Level:  [facility] PARTICIPATION LEVEL: Did Not Attend   Description of Group:   In this group patients will be encouraged to explore what they see as obstacles to their own wellness and recovery. They will be guided to discuss their thoughts, feelings, and behaviors related to these obstacles. The group will process together ways to cope with barriers, with attention given to specific choices patients can make. Each patient will be challenged to identify changes they are motivated to make in order to overcome their obstacles. This group will be process-oriented, with patients participating in exploration of their own experiences as well as giving and receiving support and challenge from other group members.  Therapeutic Goals: 1. Patient will identify personal and current obstacles as they relate to admission. 2. Patient will identify barriers that currently interfere with their wellness or overcoming obstacles.  3. Patient will identify feelings, thought process and behaviors related to these barriers. 4. Patient will identify two changes they are willing to make to overcome these obstacles:    Summary of Patient Progress X   Therapeutic Modalities:   Cognitive Behavioral Therapy Solution Focused Therapy Motivational Interviewing Relapse Prevention Therapy   Marleta Lapierre R Jaslynn Thome, LCSW 

## 2022-02-16 DIAGNOSIS — F200 Paranoid schizophrenia: Secondary | ICD-10-CM | POA: Diagnosis not present

## 2022-02-16 LAB — GLUCOSE, CAPILLARY
Glucose-Capillary: 109 mg/dL — ABNORMAL HIGH (ref 70–99)
Glucose-Capillary: 117 mg/dL — ABNORMAL HIGH (ref 70–99)
Glucose-Capillary: 108 mg/dL — ABNORMAL HIGH (ref 70–99)
Glucose-Capillary: 161 mg/dL — ABNORMAL HIGH (ref 70–99)

## 2022-02-16 MED ORDER — TEMAZEPAM 15 MG PO CAPS
15.0000 mg | ORAL_CAPSULE | Freq: Every day | ORAL | Status: DC
  Administered 2022-02-16 – 2022-02-23 (×8): 15 mg via ORAL
  Filled 2022-02-16 (×8): qty 1

## 2022-02-16 NOTE — Progress Notes (Signed)
Patient has wiped off all of the information on the unit whiteboard, that's outside of the nurses station. When this writer asked patient why he erased the whiteboard, he didn't answer. It was just told to this writer that patient has also thrown away the straws and some books, that were in the dayroom, while he was down there eating dinner.

## 2022-02-16 NOTE — Progress Notes (Signed)
Recreation Therapy Notes   Date: 02/16/2022   Time: 10:15 am     Location: Craft room   Behavioral response: Inappropriate  Intervention Topic:  Problem-Solving    Discussion/Intervention:  Group content on today was focused on problem solving. The group described what problem solving is. Patients expressed how problems affect them and how they deal with problems. Individuals identified healthy ways to deal with problems. Patients explained what normally happens to them when they do not deal with problems. The group expressed reoccurring problems for them. The group participated in the intervention "Ways to Solve problems" where patients were given a chance to explore different ways to solve problems.  Clinical Observations/Feedback: Patient came to group late and stared in space. Patient pulled chair from another peer. Participant left group early due to unknown reasons and pulled quotes off craft room door.   Keyoni Lapinski LRT/CTRS        Meliana Canner 02/16/2022 1:42 PM

## 2022-02-16 NOTE — Progress Notes (Signed)
[facility] MD Progress Note  02/16/2022 3:35 PM Derrick Atkins  MRN:  [id number] Subjective: Patient seen and chart reviewed.  Patient really seems to be decompensating.  He is walking with a shuffle very slowly.  When I went to see him this afternoon he was not verbally responsive although I think he was awake.  I moved his arm and detected a very slight amount of cogwheeling but really not so much that I would expect it to cause him to see him as withdrawn as he is.  He did talk to nursing and let them know he did not sleep last night.  Looks frightened and confused. Principal Problem: Schizophrenia, paranoid type (HCC) Diagnosis: Principal Problem:   Schizophrenia, paranoid type (HCC)  Total Time spent with patient: 30 minutes  Past Psychiatric History: Past history of schizophrenia  Past Medical History:  Past Medical History:  Diagnosis Date   Cerebral palsy (HCC)    Depression    Diabetes mellitus without complication (HCC)    Hypertension    Kidney stones    Schizoaffective disorder (HCC)    Scoliosis     Past Surgical History:  Procedure Laterality Date   BOWEL RESECTION     LAPAROTOMY N/A 05/15/2020   Procedure: EXPLORATORY LAPAROTOMY;  Surgeon: Henrene Dodge, MD;  Location: [facility] ORS;  Service: General;  Laterality: N/A;   Family History:  Family History  Problem Relation Age of Onset   Heart disease Father    Heart attack Father    Family Psychiatric  History: See previous Social History:  Social History   Substance and Sexual Activity  Alcohol Use No     Social History   Substance and Sexual Activity  Drug Use No    Social History   Socioeconomic History   Marital status: Single    Spouse name: Not on file   Number of children: Not on file   Years of education: Not on file   Highest education level: Not on file  Occupational History   Not on file  Tobacco Use   Smoking status: Never   Smokeless tobacco: Never  Vaping Use   Vaping Use: Never used   Substance and Sexual Activity   Alcohol use: No   Drug use: No   Sexual activity: Not Currently  Other Topics Concern   Not on file  Social History Narrative   Not on file   Social Determinants of Health   Financial Resource Strain: Not on file  Food Insecurity: Not on file  Transportation Needs: Not on file  Physical Activity: Not on file  Stress: Not on file  Social Connections: Not on file   Additional Social History:                         Sleep: Fair  Appetite:  Fair  Current Medications: Current Facility-Administered Medications  Medication Dose Route Frequency Provider Last Rate Last Admin   acetaminophen (TYLENOL) tablet 650 mg  650 mg Oral Q6H PRN Charm Rings, NP   650 mg at 02/10/22 0005   alum & mag hydroxide-simeth (MAALOX/MYLANTA) 200-200-20 MG/5ML suspension 30 mL  30 mL Oral Q4H PRN Charm Rings, NP       aspirin EC tablet 81 mg  81 mg Oral Daily Charm Rings, NP   81 mg at 02/16/22 0831   benztropine (COGENTIN) tablet 1 mg  1 mg Oral Daily Shanley Furlough, [name], MD   1 mg at  02/16/22 0831   clonazePAM (KLONOPIN) disintegrating tablet 0.5 mg  0.5 mg Oral BID PRN Bowie Doiron T, MD   0.5 mg at 02/16/22 1040   fenofibrate tablet 54 mg  54 mg Oral Daily Charm Rings, NP   54 mg at 02/16/22 0831   gemfibrozil (LOPID) tablet 600 mg  600 mg Oral BID Charm Rings, NP   600 mg at 02/16/22 0830   haloperidol (HALDOL) tablet 5 mg  5 mg Oral Q6H PRN Quaron Delacruz, [name], MD   5 mg at 02/16/22 1040   Or   haloperidol lactate (HALDOL) injection 5 mg  5 mg Intramuscular Q6H PRN Desmen Schoffstall, [name], MD       hydrOXYzine (ATARAX) tablet 50 mg  50 mg Oral TID PRN Brantly Kalman T, MD   50 mg at 02/13/22 2133   insulin aspart (novoLOG) injection 0-15 Units  0-15 Units Subcutaneous TID WC Jeri Rawlins, [name], MD   3 Units at 02/13/22 1622   levothyroxine (SYNTHROID) tablet 88 mcg  88 mcg Oral Daily Charm Rings, NP   88 mcg at 02/16/22 0615   magnesium hydroxide  (MILK OF MAGNESIA) suspension 30 mL  30 mL Oral Daily PRN Charm Rings, NP   30 mL at 02/16/22 1045   metFORMIN (GLUCOPHAGE) tablet 1,000 mg  1,000 mg Oral BID WC Charm Rings, NP   1,000 mg at 02/16/22 0830   ondansetron (ZOFRAN) tablet 4 mg  4 mg Oral Q6H PRN Sarina Ill, DO   4 mg at 02/06/22 1755   pioglitazone (ACTOS) tablet 45 mg  45 mg Oral Daily Charm Rings, NP   45 mg at 02/16/22 0831   simvastatin (ZOCOR) tablet 20 mg  20 mg Oral QHS Charm Rings, NP   20 mg at 02/15/22 2119   temazepam (RESTORIL) capsule 15 mg  15 mg Oral QHS PRN Sheyli Horwitz, [name], MD   15 mg at 02/15/22 2119   temazepam (RESTORIL) capsule 15 mg  15 mg Oral QHS Daianna Vasques, [name], MD        Lab Results:  Results for orders placed or performed during the hospital encounter of 02/05/22 (from the past 48 hour(s))  Glucose, capillary     Status: Abnormal   Collection Time: 02/14/22  4:26 PM  Result Value Ref Range   Glucose-Capillary 118 (H) 70 - 99 mg/dL    Comment: Glucose reference range applies only to samples taken after fasting for at least 8 hours.  Glucose, capillary     Status: None   Collection Time: 02/15/22  6:52 AM  Result Value Ref Range   Glucose-Capillary 89 70 - 99 mg/dL    Comment: Glucose reference range applies only to samples taken after fasting for at least 8 hours.  Glucose, capillary     Status: None   Collection Time: 02/15/22 11:52 AM  Result Value Ref Range   Glucose-Capillary 91 70 - 99 mg/dL    Comment: Glucose reference range applies only to samples taken after fasting for at least 8 hours.  Glucose, capillary     Status: Abnormal   Collection Time: 02/15/22  4:35 PM  Result Value Ref Range   Glucose-Capillary 105 (H) 70 - 99 mg/dL    Comment: Glucose reference range applies only to samples taken after fasting for at least 8 hours.  Glucose, capillary     Status: Abnormal   Collection Time: 02/15/22  8:14 PM  Result Value Ref  Range   Glucose-Capillary 190 (H)  70 - 99 mg/dL    Comment: Glucose reference range applies only to samples taken after fasting for at least 8 hours.  Glucose, capillary     Status: Abnormal   Collection Time: 02/16/22  7:45 AM  Result Value Ref Range   Glucose-Capillary 109 (H) 70 - 99 mg/dL    Comment: Glucose reference range applies only to samples taken after fasting for at least 8 hours.  Glucose, capillary     Status: Abnormal   Collection Time: 02/16/22 11:23 AM  Result Value Ref Range   Glucose-Capillary 117 (H) 70 - 99 mg/dL    Comment: Glucose reference range applies only to samples taken after fasting for at least 8 hours.    Blood Alcohol level:  Lab Results  Component Value Date   ETH <10 02/02/2022   ETH <10 01/18/2022    Metabolic Disorder Labs: Lab Results  Component Value Date   HGBA1C 7.0 (H) 02/08/2022   MPG 154.2 02/08/2022   MPG 116.89 05/13/2020   No results found for: "PROLACTIN" Lab Results  Component Value Date   CHOL 98 02/08/2022   TRIG 60 02/08/2022   HDL 44 02/08/2022   CHOLHDL 2.2 02/08/2022   VLDL 12 02/08/2022   LDLCALC 42 02/08/2022    Physical Findings: AIMS:  , ,  ,  ,    CIWA:    COWS:     Musculoskeletal: Strength & Muscle Tone: within normal limits Gait & Station: shuffle Patient leans: N/A  Psychiatric Specialty Exam:  Presentation  General Appearance: Disheveled  Eye Contact:None  Speech:Garbled; Pressured  Speech Volume:Increased  Handedness:Right   Mood and Affect  Mood:Dysphoric; Irritable  Affect:Inappropriate; Full Range   Thought Process  Thought Processes:Disorganized  Descriptions of Associations:Loose  Orientation:Partial  Thought Content:Illogical; Tangential; Scattered; Delusions; Paranoid Ideation  History of Schizophrenia/Schizoaffective disorder:Yes  Duration of Psychotic Symptoms:Greater than six months  Hallucinations:No data recorded Ideas of Reference:Paranoia  Suicidal Thoughts:No data recorded Homicidal  Thoughts:No data recorded  Sensorium  Memory:Immediate Poor; Recent Poor; Remote Poor  Judgment:Impaired  Insight:Lacking   Executive Functions  Concentration:Poor  Attention Span:Poor  Recall:Poor  Fund of Knowledge:Poor  Language:Poor   Psychomotor Activity  Psychomotor Activity:No data recorded  Assets  Assets:Financial Resources/Insurance; Physical Health; Resilience; Social Support   Sleep  Sleep:No data recorded   Physical Exam: Physical Exam Vitals and nursing note reviewed.  Constitutional:      Appearance: Normal appearance.  HENT:     Head: Normocephalic and atraumatic.     Mouth/Throat:     Pharynx: Oropharynx is clear.  Eyes:     Pupils: Pupils are equal, round, and reactive to light.  Cardiovascular:     Rate and Rhythm: Normal rate and regular rhythm.  Pulmonary:     Effort: Pulmonary effort is normal.     Breath sounds: Normal breath sounds.  Abdominal:     General: Abdomen is flat.     Palpations: Abdomen is soft.  Musculoskeletal:        General: Normal range of motion.  Skin:    General: Skin is warm and dry.  Neurological:     General: No focal deficit present.     Mental Status: He is alert. Mental status is at baseline.  Psychiatric:        Attention and Perception: He is inattentive.        Mood and Affect: Mood normal. Affect is blunt.  Speech: He is noncommunicative.        Behavior: Behavior is withdrawn.        Cognition and Memory: Cognition is impaired.    Review of Systems  Unable to perform ROS: Psychiatric disorder   Blood pressure 139/68, pulse 94, temperature 97.9 F (36.6 C), temperature source Oral, resp. rate 18, height 6\' 4"  (1.93 m), weight 109.3 kg, SpO2 100 %. Body mass index is 29.34 kg/m.   Treatment Plan Summary: Medication management and Plan patient is decompensating.  One concern I had is that he could be having severe extrapyramidal symptoms.  I detect some slight possibility of that but I  doubt that is what is causing all of his withdrawal.  I am going to stop the Abilify for a day and see if it looks a little better.  Meanwhile I will make sure he has Restoril for sleep at night.  I am thinking he seems to be getting worse rather than better and we probably if he is not showing great improvement after just stopping the Abilify need to change to a different medicine or consider ECT.  , MD 02/16/2022, 3:35 PM

## 2022-02-16 NOTE — Progress Notes (Signed)
This Clinical research associate was coming back into the nurses station when our secretary informed me that patient had grabbed [place], MHT. This writer went down to talk to patient and informed him that he could not touch staff, nor any other patients on the unit. Patient was also informed to keep his hands to himself. Patient shrugged his shoulders and walked off, mumbling something that this writer could not understand. Then patient proceeds to point at another patient walking that is already down the hall, still mumbling, as he goes into the dayroom.

## 2022-02-16 NOTE — Plan of Care (Signed)
  Problem: Education: Goal: Knowledge of General Education information will improve Description: Including pain rating scale, medication(s)/side effects and non-pharmacologic comfort measures Outcome: Not Progressing   Problem: Coping: Goal: Level of anxiety will decrease Outcome: Not Progressing   Problem: Education: Goal: Mental status will improve Outcome: Not Progressing

## 2022-02-16 NOTE — Group Note (Signed)
[facility] LCSW Group Therapy Note   Group Date: 02/16/2022 Start Time: 1300 End Time: 1400  Type of Therapy/Topic:  Group Therapy:  Feelings about Diagnosis  Participation Level:  Active    Description of Group:    This group will allow patients to explore their thoughts and feelings about diagnoses they have received. Patients will be guided to explore their level of understanding and acceptance of these diagnoses. Facilitator will encourage patients to process their thoughts and feelings about the reactions of others to their diagnosis, and will guide patients in identifying ways to discuss their diagnosis with significant others in their lives. This group will be process-oriented, with patients participating in exploration of their own experiences as well as giving and receiving support and challenge from other group members.   Therapeutic Goals: 1. Patient will demonstrate understanding of diagnosis as evidence by identifying two or more symptoms of the disorder:  2. Patient will be able to express two feelings regarding the diagnosis 3. Patient will demonstrate ability to communicate their needs through discussion and/or role plays  Summary of Patient Progress: Patient was present for the entirety of the group discussion. He spoke at length many times about a surgery, [name] and [name] or [name] and [name], and people trying to hypnotize. Pt made complaints about another peer staring at him. His comments were not connected to the discussion of the group and he appeared to be stuck in this one train of thought as he repeated the same phrases over and over again.    Therapeutic Modalities:   Cognitive Behavioral Therapy Brief Therapy Feelings Identification    Glenis Smoker, LCSW

## 2022-02-16 NOTE — Progress Notes (Incomplete)
Patient has apologized for his behavior earlier. He is sitting in the chair outside the med room in a tearful mood. Wanting to know how many days he has been here. He was told 11 days total.  He was not happy with the answer.  Advised that the moods and behaviors that he displays will dictate the amount of time that he stays. He was med compliant and received his meds without incident. Reports wanting to do better so that he can leave. Reported that some of the other patients have been agitating him and gets him angry. Was receptive to discussion regarding walking away and removing from him

## 2022-02-16 NOTE — Progress Notes (Signed)
Patient irritable, violent, demanding. Attempted to ram chair into other peer. Charge mht,  Cursing, saying fuck you. Continues with moderate re-direction. Refusing cbg check this morning. Patient banging doors against the wall. Continues to approach same peer in a threatening manner. Not easily redirectable.

## 2022-02-16 NOTE — Progress Notes (Signed)
Patient has apologized for his behavior earlier. He is sitting in the chair outside the med room in a tearful mood. Wanting to know how many days he has been here. He was told 11 days total.  He was not happy with the answer.  Advised that the moods and behaviors that he displays will dictate the amount of time that he stays. He was med compliant and received his meds without incident. Reports wanting to do better so that he can leave.   He denies si/hi/avh.  Does endorse depression and anxiety rating both 8/10. Reported that some of the other patients have been agitating him and gets him angry. Was receptive to discussion regarding walking away and removing himself from situations that cause him to get upset.  Will continue to offer support and encouragement and monitor with q 15 minute safety checks.      C Butler-Nicholson, LPN

## 2022-02-16 NOTE — Progress Notes (Signed)
This Clinical research associate asked patient why he threw the straws and books away and he looked at me as if I was speaking a foreign language. This Clinical research associate asked patient if he remembered doing this and he shook his head as if saying "no". This Clinical research associate gave patient PRN medication for agitation/anxiety. Patient tolerated med administration well, without any issues. Patient remains safe on the unit.

## 2022-02-16 NOTE — Plan of Care (Signed)
D- Patient alert and oriented to person and situation. Patient presented in an anxious but pleasant mood on assessment stating that he did not sleep at all last night, due to having nightmares. When this writer asked patient about his last bowel movement, he couldn't tell this Clinical research associate when it was, however, he did request PRN MOM from this Clinical research associate. Patient denied SI, HI, AVH, and pain at this time. When this writer asked patient about any signs/symptoms of depression and anxiety, he stated "I don't know yet". Patient does mumble and slur his speech at times, and other times his speech is very clear. Patient had no stated goals for today.  A- Scheduled medications administered to patient, per MD orders. Support and encouragement provided.  Routine safety checks conducted every 15 minutes.  Patient informed to notify staff with problems or concerns.  R- No adverse drug reactions noted. Patient contracts for safety at this time. Patient compliant with medications and treatment plan. Patient receptive, calm, and cooperative. Patient remains safe at this time.  Problem: Education: Goal: Knowledge of General Education information will improve Description: Including pain rating scale, medication(s)/side effects and non-pharmacologic comfort measures Outcome: Not Progressing   Problem: Health Behavior/Discharge Planning: Goal: Ability to manage health-related needs will improve Outcome: Not Progressing   Problem: Clinical Measurements: Goal: Ability to maintain clinical measurements within normal limits will improve Outcome: Not Progressing Goal: Will remain free from infection Outcome: Not Progressing Goal: Diagnostic test results will improve Outcome: Not Progressing Goal: Respiratory complications will improve Outcome: Not Progressing Goal: Cardiovascular complication will be avoided Outcome: Not Progressing   Problem: Activity: Goal: Risk for activity intolerance will decrease Outcome: Not  Progressing   Problem: Nutrition: Goal: Adequate nutrition will be maintained Outcome: Not Progressing   Problem: Coping: Goal: Level of anxiety will decrease Outcome: Not Progressing   Problem: Elimination: Goal: Will not experience complications related to bowel motility Outcome: Not Progressing Goal: Will not experience complications related to urinary retention Outcome: Not Progressing   Problem: Pain Managment: Goal: General experience of comfort will improve Outcome: Not Progressing   Problem: Safety: Goal: Ability to remain free from injury will improve Outcome: Not Progressing   Problem: Skin Integrity: Goal: Risk for impaired skin integrity will decrease Outcome: Not Progressing   Problem: Education: Goal: Knowledge of [place] [facility] Education information/materials will improve Outcome: Not Progressing Goal: Emotional status will improve Outcome: Not Progressing Goal: Mental status will improve Outcome: Not Progressing Goal: Verbalization of understanding the information provided will improve Outcome: Not Progressing   Problem: Activity: Goal: Interest or engagement in activities will improve Outcome: Not Progressing Goal: Sleeping patterns will improve Outcome: Not Progressing   Problem: Coping: Goal: Ability to verbalize frustrations and anger appropriately will improve Outcome: Not Progressing Goal: Ability to demonstrate self-control will improve Outcome: Not Progressing   Problem: Health Behavior/Discharge Planning: Goal: Identification of resources available to assist in meeting health care needs will improve Outcome: Not Progressing Goal: Compliance with treatment plan for underlying cause of condition will improve Outcome: Not Progressing   Problem: Physical Regulation: Goal: Ability to maintain clinical measurements within normal limits will improve Outcome: Not Progressing   Problem: Safety: Goal: Periods of time without injury will  increase Outcome: Not Progressing   Problem: Education: Goal: Utilization of techniques to improve thought processes will improve Outcome: Not Progressing Goal: Knowledge of the prescribed therapeutic regimen will improve Outcome: Not Progressing   Problem: Activity: Goal: Interest or engagement in leisure activities will improve Outcome: Not  Progressing Goal: Imbalance in normal sleep/wake cycle will improve Outcome: Not Progressing   Problem: Coping: Goal: Coping ability will improve Outcome: Not Progressing Goal: Will verbalize feelings Outcome: Not Progressing   Problem: Health Behavior/Discharge Planning: Goal: Ability to make decisions will improve Outcome: Not Progressing Goal: Compliance with therapeutic regimen will improve Outcome: Not Progressing   Problem: Role Relationship: Goal: Will demonstrate positive changes in social behaviors and relationships Outcome: Not Progressing   Problem: Safety: Goal: Ability to disclose and discuss suicidal ideas will improve Outcome: Not Progressing Goal: Ability to identify and utilize support systems that promote safety will improve Outcome: Not Progressing   Problem: Self-Concept: Goal: Will verbalize positive feelings about self Outcome: Not Progressing Goal: Level of anxiety will decrease Outcome: Not Progressing   Problem: Education: Goal: Ability to state activities that reduce stress will improve Outcome: Not Progressing   Problem: Coping: Goal: Ability to identify and develop effective coping behavior will improve Outcome: Not Progressing   Problem: Self-Concept: Goal: Ability to identify factors that promote anxiety will improve Outcome: Not Progressing Goal: Level of anxiety will decrease Outcome: Not Progressing Goal: Ability to modify response to factors that promote anxiety will improve Outcome: Not Progressing   Problem: Education: Goal: Ability to describe self-care measures that may prevent or  decrease complications (Diabetes Survival Skills Education) will improve Outcome: Not Progressing Goal: Individualized Educational Video(s) Outcome: Not Progressing   Problem: Coping: Goal: Ability to adjust to condition or change in health will improve Outcome: Not Progressing

## 2022-02-17 DIAGNOSIS — F200 Paranoid schizophrenia: Secondary | ICD-10-CM | POA: Diagnosis not present

## 2022-02-17 LAB — GLUCOSE, CAPILLARY
Glucose-Capillary: 98 mg/dL (ref 70–99)
Glucose-Capillary: 139 mg/dL — ABNORMAL HIGH (ref 70–99)
Glucose-Capillary: 80 mg/dL (ref 70–99)
Glucose-Capillary: 154 mg/dL — ABNORMAL HIGH (ref 70–99)

## 2022-02-17 NOTE — Progress Notes (Addendum)
Recreation Therapy Notes    Date: 02/17/2022   Time: 10:30 am     Location: Court yard    Behavioral response: N/A   Intervention Topic: Decision Making  Discussion/Intervention: Patient refused to attend group.    Clinical Observations/Feedback:  Patient refused to attend group.    Keydi Giel LRT/CTRS          Melchor Kirchgessner 02/17/2022 10:39 AM

## 2022-02-17 NOTE — Plan of Care (Signed)
D: Pt alert and oriented. Pt only endorses experiencing anxiety at this time. Pt denies experiencing any pain at this time. Pt denies experiencing any SI/HI, or AVH at this time however, is observed as responding to internal stimuli.  Pt was able to speak clearly with this writer this morning when assessment question were asked.   A: Scheduled medications administered to pt, per MD orders. Support and encouragement provided. Frequent verbal contact made. Routine safety checks conducted q15 minutes.   R: No adverse drug reactions noted. Pt verbally contracts for safety at this time. Pt compliant with medications. Pt interacts minimally with others on the unit. Pt remains safe at this time. Will continue to monitor.   Problem: Nutrition: Goal: Adequate nutrition will be maintained Outcome: Progressing   Problem: Coping: Goal: Level of anxiety will decrease Outcome: Progressing

## 2022-02-17 NOTE — Progress Notes (Signed)
Pt in room this morning. Pt has come onto unit for brief periods to eat and wander hallway returning to room shortly after. Pt walks slowly and independently. Pt speech is soft and clear at times, some dysarthria noted. Pt endorses A/V hallucinations this morning. When asked to describe hallucinations pt replied "I dont know." Pt denies SI/HI/thoughts of self harm. Pt remains medication compliant. Pt unsure of last bowel movement. Pt denies distress, pain, or discomfort.

## 2022-02-17 NOTE — Plan of Care (Signed)
  Problem: Education: Goal: Knowledge of General Education information will improve Description: Including pain rating scale, medication(s)/side effects and non-pharmacologic comfort measures Outcome: Progressing   Problem: Health Behavior/Discharge Planning: Goal: Ability to manage health-related needs will improve Outcome: Progressing   Problem: Clinical Measurements: Goal: Ability to maintain clinical measurements within normal limits will improve Outcome: Progressing Goal: Will remain free from infection Outcome: Progressing Goal: Diagnostic test results will improve Outcome: Progressing Goal: Respiratory complications will improve Outcome: Progressing Goal: Cardiovascular complication will be avoided Outcome: Progressing   Problem: Self-Concept: Goal: Ability to identify factors that promote anxiety will improve Outcome: Progressing Goal: Level of anxiety will decrease Outcome: Progressing Goal: Ability to modify response to factors that promote anxiety will improve Outcome: Progressing   Problem: Coping: Goal: Ability to identify and develop effective coping behavior will improve Outcome: Progressing

## 2022-02-17 NOTE — Progress Notes (Signed)
[facility] MD Progress Note  02/17/2022 3:52 PM Derrick Atkins  MRN:  [id number] Subjective: Patient seen and chart reviewed.  Patient seems more alert today possibly less sedated because of skipping medication.  Advised nursing today that he was not having hallucinations.  No agitated behavior Principal Problem: Schizophrenia, paranoid type (HCC) Diagnosis: Principal Problem:   Schizophrenia, paranoid type (HCC)  Total Time spent with patient: 30 minutes  Past Psychiatric History: Past history of schizophrenia  Past Medical History:  Past Medical History:  Diagnosis Date   Cerebral palsy (HCC)    Depression    Diabetes mellitus without complication (HCC)    Hypertension    Kidney stones    Schizoaffective disorder (HCC)    Scoliosis     Past Surgical History:  Procedure Laterality Date   BOWEL RESECTION     LAPAROTOMY N/A 05/15/2020   Procedure: EXPLORATORY LAPAROTOMY;  Surgeon: Henrene Dodge, MD;  Location: [facility] ORS;  Service: General;  Laterality: N/A;   Family History:  Family History  Problem Relation Age of Onset   Heart disease Father    Heart attack Father    Family Psychiatric  History: See previous Social History:  Social History   Substance and Sexual Activity  Alcohol Use No     Social History   Substance and Sexual Activity  Drug Use No    Social History   Socioeconomic History   Marital status: Single    Spouse name: Not on file   Number of children: Not on file   Years of education: Not on file   Highest education level: Not on file  Occupational History   Not on file  Tobacco Use   Smoking status: Never   Smokeless tobacco: Never  Vaping Use   Vaping Use: Never used  Substance and Sexual Activity   Alcohol use: No   Drug use: No   Sexual activity: Not Currently  Other Topics Concern   Not on file  Social History Narrative   Not on file   Social Determinants of Health   Financial Resource Strain: Not on file  Food Insecurity: Not on  file  Transportation Needs: Not on file  Physical Activity: Not on file  Stress: Not on file  Social Connections: Not on file   Additional Social History:                         Sleep: Fair  Appetite:  Fair  Current Medications: Current Facility-Administered Medications  Medication Dose Route Frequency Provider Last Rate Last Admin   acetaminophen (TYLENOL) tablet 650 mg  650 mg Oral Q6H PRN Charm Rings, NP   650 mg at 02/10/22 0005   alum & mag hydroxide-simeth (MAALOX/MYLANTA) 200-200-20 MG/5ML suspension 30 mL  30 mL Oral Q4H PRN Charm Rings, NP       aspirin EC tablet 81 mg  81 mg Oral Daily Charm Rings, NP   81 mg at 02/17/22 1937   benztropine (COGENTIN) tablet 1 mg  1 mg Oral Daily Zyad Boomer T, MD   1 mg at 02/17/22 9024   clonazePAM (KLONOPIN) disintegrating tablet 0.5 mg  0.5 mg Oral BID PRN Aarya Robinson T, MD   0.5 mg at 02/17/22 1108   fenofibrate tablet 54 mg  54 mg Oral Daily Charm Rings, NP   54 mg at 02/17/22 0973   gemfibrozil (LOPID) tablet 600 mg  600 mg Oral BID Charm Rings,  NP   600 mg at 02/17/22 0809   haloperidol (HALDOL) tablet 5 mg  5 mg Oral Q6H PRN Selin Eisler T, MD   5 mg at 02/16/22 1700   Or   haloperidol lactate (HALDOL) injection 5 mg  5 mg Intramuscular Q6H PRN Kweli Grassel, [name], MD       hydrOXYzine (ATARAX) tablet 50 mg  50 mg Oral TID PRN Jenie Parish T, MD   50 mg at 02/16/22 1700   insulin aspart (novoLOG) injection 0-15 Units  0-15 Units Subcutaneous TID WC Linton Stolp, [name], MD   2 Units at 02/17/22 1115   levothyroxine (SYNTHROID) tablet 88 mcg  88 mcg Oral Daily Charm Rings, NP   88 mcg at 02/17/22 0809   magnesium hydroxide (MILK OF MAGNESIA) suspension 30 mL  30 mL Oral Daily PRN Charm Rings, NP   30 mL at 02/16/22 1045   metFORMIN (GLUCOPHAGE) tablet 1,000 mg  1,000 mg Oral BID WC Charm Rings, NP   1,000 mg at 02/17/22 0808   ondansetron (ZOFRAN) tablet 4 mg  4 mg Oral Q6H PRN Sarina Ill, DO   4 mg at 02/06/22 1755   pioglitazone (ACTOS) tablet 45 mg  45 mg Oral Daily Charm Rings, NP   45 mg at 02/17/22 0809   simvastatin (ZOCOR) tablet 20 mg  20 mg Oral QHS Charm Rings, NP   20 mg at 02/16/22 2118   temazepam (RESTORIL) capsule 15 mg  15 mg Oral QHS PRN Ginny Loomer, [name], MD   15 mg at 02/15/22 2119   temazepam (RESTORIL) capsule 15 mg  15 mg Oral QHS Tipton Ballow, [name], MD   15 mg at 02/16/22 2117    Lab Results:  Results for orders placed or performed during the hospital encounter of 02/05/22 (from the past 48 hour(s))  Glucose, capillary     Status: Abnormal   Collection Time: 02/15/22  4:35 PM  Result Value Ref Range   Glucose-Capillary 105 (H) 70 - 99 mg/dL    Comment: Glucose reference range applies only to samples taken after fasting for at least 8 hours.  Glucose, capillary     Status: Abnormal   Collection Time: 02/15/22  8:14 PM  Result Value Ref Range   Glucose-Capillary 190 (H) 70 - 99 mg/dL    Comment: Glucose reference range applies only to samples taken after fasting for at least 8 hours.  Glucose, capillary     Status: Abnormal   Collection Time: 02/16/22  7:45 AM  Result Value Ref Range   Glucose-Capillary 109 (H) 70 - 99 mg/dL    Comment: Glucose reference range applies only to samples taken after fasting for at least 8 hours.  Glucose, capillary     Status: Abnormal   Collection Time: 02/16/22 11:23 AM  Result Value Ref Range   Glucose-Capillary 117 (H) 70 - 99 mg/dL    Comment: Glucose reference range applies only to samples taken after fasting for at least 8 hours.  Glucose, capillary     Status: Abnormal   Collection Time: 02/16/22  4:15 PM  Result Value Ref Range   Glucose-Capillary 108 (H) 70 - 99 mg/dL    Comment: Glucose reference range applies only to samples taken after fasting for at least 8 hours.  Glucose, capillary     Status: Abnormal   Collection Time: 02/16/22  9:22 PM  Result Value Ref Range   Glucose-Capillary 161  (H) 70 -  99 mg/dL    Comment: Glucose reference range applies only to samples taken after fasting for at least 8 hours.  Glucose, capillary     Status: None   Collection Time: 02/17/22  6:55 AM  Result Value Ref Range   Glucose-Capillary 98 70 - 99 mg/dL    Comment: Glucose reference range applies only to samples taken after fasting for at least 8 hours.  Glucose, capillary     Status: Abnormal   Collection Time: 02/17/22 11:13 AM  Result Value Ref Range   Glucose-Capillary 139 (H) 70 - 99 mg/dL    Comment: Glucose reference range applies only to samples taken after fasting for at least 8 hours.    Blood Alcohol level:  Lab Results  Component Value Date   ETH <10 02/02/2022   ETH <10 01/18/2022    Metabolic Disorder Labs: Lab Results  Component Value Date   HGBA1C 7.0 (H) 02/08/2022   MPG 154.2 02/08/2022   MPG 116.89 05/13/2020   No results found for: "PROLACTIN" Lab Results  Component Value Date   CHOL 98 02/08/2022   TRIG 60 02/08/2022   HDL 44 02/08/2022   CHOLHDL 2.2 02/08/2022   VLDL 12 02/08/2022   LDLCALC 42 02/08/2022    Physical Findings: AIMS:  , ,  ,  ,    CIWA:    COWS:     Musculoskeletal: Strength & Muscle Tone: within normal limits Gait & Station: normal Patient leans: N/A  Psychiatric Specialty Exam:  Presentation  General Appearance: Disheveled  Eye Contact:None  Speech:Garbled; Pressured  Speech Volume:Increased  Handedness:Right   Mood and Affect  Mood:Dysphoric; Irritable  Affect:Inappropriate; Full Range   Thought Process  Thought Processes:Disorganized  Descriptions of Associations:Loose  Orientation:Partial  Thought Content:Illogical; Tangential; Scattered; Delusions; Paranoid Ideation  History of Schizophrenia/Schizoaffective disorder:Yes  Duration of Psychotic Symptoms:Greater than six months  Hallucinations:No data recorded Ideas of Reference:Paranoia  Suicidal Thoughts:No data recorded Homicidal  Thoughts:No data recorded  Sensorium  Memory:Immediate Poor; Recent Poor; Remote Poor  Judgment:Impaired  Insight:Lacking   Executive Functions  Concentration:Poor  Attention Span:Poor  Recall:Poor  Fund of Knowledge:Poor  Language:Poor   Psychomotor Activity  Psychomotor Activity:No data recorded  Assets  Assets:Financial Resources/Insurance; Physical Health; Resilience; Social Support   Sleep  Sleep:No data recorded   Physical Exam: Physical Exam Vitals and nursing note reviewed.  Constitutional:      Appearance: Normal appearance.  HENT:     Head: Normocephalic and atraumatic.     Mouth/Throat:     Pharynx: Oropharynx is clear.  Eyes:     Pupils: Pupils are equal, round, and reactive to light.  Cardiovascular:     Rate and Rhythm: Normal rate and regular rhythm.  Pulmonary:     Effort: Pulmonary effort is normal.     Breath sounds: Normal breath sounds.  Abdominal:     General: Abdomen is flat.     Palpations: Abdomen is soft.  Musculoskeletal:        General: Normal range of motion.  Skin:    General: Skin is warm and dry.  Neurological:     General: No focal deficit present.     Mental Status: He is alert. Mental status is at baseline.  Psychiatric:        Attention and Perception: He is inattentive.        Mood and Affect: Mood normal. Affect is blunt.        Speech: Speech is delayed.  Behavior: Behavior is slowed.        Thought Content: Thought content normal.    Review of Systems  Constitutional: Negative.   HENT: Negative.    Eyes: Negative.   Respiratory: Negative.    Cardiovascular: Negative.   Gastrointestinal: Negative.   Musculoskeletal: Negative.   Skin: Negative.   Neurological: Negative.   Psychiatric/Behavioral: Negative.     Blood pressure 137/67, pulse 96, temperature 98 F (36.7 C), temperature source Oral, resp. rate 18, height 6\' 4"  (1.93 m), weight 109.3 kg, SpO2 100 %. Body mass index is 29.34  kg/m.   Treatment Plan Summary: Plan continue to hold medication while monitoring to see if he will improve.  , MD 02/17/2022, 3:52 PM

## 2022-02-18 DIAGNOSIS — F200 Paranoid schizophrenia: Secondary | ICD-10-CM | POA: Diagnosis not present

## 2022-02-18 LAB — GLUCOSE, CAPILLARY
Glucose-Capillary: 100 mg/dL — ABNORMAL HIGH (ref 70–99)
Glucose-Capillary: 104 mg/dL — ABNORMAL HIGH (ref 70–99)
Glucose-Capillary: 99 mg/dL (ref 70–99)

## 2022-02-18 MED ORDER — ARIPIPRAZOLE ER 400 MG IM SRER
400.0000 mg | INTRAMUSCULAR | Status: DC
  Administered 2022-02-18: 400 mg via INTRAMUSCULAR
  Filled 2022-02-18: qty 2

## 2022-02-18 NOTE — Progress Notes (Signed)
Recreation Therapy Notes   Date: 02/18/2022   Time: 9:40 am     Location: Craft room     Behavioral response: N/A   Intervention Topic: Life Planning   Discussion/Intervention: Patient refused to attend group.    Clinical Observations/Feedback:  Patient refused to attend group.    Izaia Say LRT/CTRS       Shandee Jergens 02/18/2022 10:22 AM

## 2022-02-18 NOTE — Progress Notes (Signed)
[facility] MD Progress Note  02/18/2022 4:29 PM Derrick Atkins  MRN:  [id number] Subjective: Follow-up patient with schizophrenia.  Patient seen and chart reviewed.  He seems more lucid today.  Able to have something like a generally lucid conversation although still gets disorganized quickly.  It looks like a lot of his difficulty moving is probably from his cerebral palsy but I think he is also significantly less stiff off of the oral medicine.  No threatening behavior no aggression. Principal Problem: Schizophrenia, paranoid type (HCC) Diagnosis: Principal Problem:   Schizophrenia, paranoid type (HCC)  Total Time spent with patient: 30 minutes  Past Psychiatric History: Past history of schizophrenia  Past Medical History:  Past Medical History:  Diagnosis Date   Cerebral palsy (HCC)    Depression    Diabetes mellitus without complication (HCC)    Hypertension    Kidney stones    Schizoaffective disorder (HCC)    Scoliosis     Past Surgical History:  Procedure Laterality Date   BOWEL RESECTION     LAPAROTOMY N/A 05/15/2020   Procedure: EXPLORATORY LAPAROTOMY;  Surgeon: Henrene Dodge, MD;  Location: [facility] ORS;  Service: General;  Laterality: N/A;   Family History:  Family History  Problem Relation Age of Onset   Heart disease Father    Heart attack Father    Family Psychiatric  History: See previous Social History:  Social History   Substance and Sexual Activity  Alcohol Use No     Social History   Substance and Sexual Activity  Drug Use No    Social History   Socioeconomic History   Marital status: Single    Spouse name: Not on file   Number of children: Not on file   Years of education: Not on file   Highest education level: Not on file  Occupational History   Not on file  Tobacco Use   Smoking status: Never   Smokeless tobacco: Never  Vaping Use   Vaping Use: Never used  Substance and Sexual Activity   Alcohol use: No   Drug use: No   Sexual activity: Not  Currently  Other Topics Concern   Not on file  Social History Narrative   Not on file   Social Determinants of Health   Financial Resource Strain: Not on file  Food Insecurity: Not on file  Transportation Needs: Not on file  Physical Activity: Not on file  Stress: Not on file  Social Connections: Not on file   Additional Social History:                         Sleep: Fair  Appetite:  Fair  Current Medications: Current Facility-Administered Medications  Medication Dose Route Frequency Provider Last Rate Last Admin   acetaminophen (TYLENOL) tablet 650 mg  650 mg Oral Q6H PRN Charm Rings, NP   650 mg at 02/17/22 2123   alum & mag hydroxide-simeth (MAALOX/MYLANTA) 200-200-20 MG/5ML suspension 30 mL  30 mL Oral Q4H PRN Charm Rings, NP       ARIPiprazole ER (ABILIFY MAINTENA) injection 400 mg  400 mg Intramuscular Q28 days Erdem Naas T, MD   400 mg at 02/18/22 1416   aspirin EC tablet 81 mg  81 mg Oral Daily Charm Rings, NP   81 mg at 02/18/22 3818   benztropine (COGENTIN) tablet 1 mg  1 mg Oral Daily Cleda Imel, [name], MD   1 mg at 02/18/22 [phone]  clonazePAM (KLONOPIN) disintegrating tablet 0.5 mg  0.5 mg Oral BID PRN Calib Wadhwa T, MD   0.5 mg at 02/18/22 0934   fenofibrate tablet 54 mg  54 mg Oral Daily Charm Rings, NP   54 mg at 02/18/22 0809   gemfibrozil (LOPID) tablet 600 mg  600 mg Oral BID Charm Rings, NP   600 mg at 02/18/22 0809   haloperidol (HALDOL) tablet 5 mg  5 mg Oral Q6H PRN Kalan Rinn, [name], MD   5 mg at 02/17/22 2124   Or   haloperidol lactate (HALDOL) injection 5 mg  5 mg Intramuscular Q6H PRN Brant Peets, [name], MD       hydrOXYzine (ATARAX) tablet 50 mg  50 mg Oral TID PRN Abbagale Goguen T, MD   50 mg at 02/18/22 1356   insulin aspart (novoLOG) injection 0-15 Units  0-15 Units Subcutaneous TID WC Kayln Garceau, [name], MD   2 Units at 02/17/22 1115   levothyroxine (SYNTHROID) tablet 88 mcg  88 mcg Oral Daily Charm Rings, NP   88 mcg at  02/18/22 7412   magnesium hydroxide (MILK OF MAGNESIA) suspension 30 mL  30 mL Oral Daily PRN Charm Rings, NP   30 mL at 02/18/22 0934   metFORMIN (GLUCOPHAGE) tablet 1,000 mg  1,000 mg Oral BID WC Charm Rings, NP   1,000 mg at 02/18/22 1627   ondansetron (ZOFRAN) tablet 4 mg  4 mg Oral Q6H PRN Sarina Ill, DO   4 mg at 02/06/22 1755   pioglitazone (ACTOS) tablet 45 mg  45 mg Oral Daily Charm Rings, NP   45 mg at 02/18/22 0809   simvastatin (ZOCOR) tablet 20 mg  20 mg Oral QHS Charm Rings, NP   20 mg at 02/17/22 2124   temazepam (RESTORIL) capsule 15 mg  15 mg Oral QHS PRN Junius Faucett, [name], MD   15 mg at 02/17/22 2124   temazepam (RESTORIL) capsule 15 mg  15 mg Oral QHS Emaad Nanna, [name], MD   15 mg at 02/17/22 2124    Lab Results:  Results for orders placed or performed during the hospital encounter of 02/05/22 (from the past 48 hour(s))  Glucose, capillary     Status: Abnormal   Collection Time: 02/16/22  9:22 PM  Result Value Ref Range   Glucose-Capillary 161 (H) 70 - 99 mg/dL    Comment: Glucose reference range applies only to samples taken after fasting for at least 8 hours.  Glucose, capillary     Status: None   Collection Time: 02/17/22  6:55 AM  Result Value Ref Range   Glucose-Capillary 98 70 - 99 mg/dL    Comment: Glucose reference range applies only to samples taken after fasting for at least 8 hours.  Glucose, capillary     Status: Abnormal   Collection Time: 02/17/22 11:13 AM  Result Value Ref Range   Glucose-Capillary 139 (H) 70 - 99 mg/dL    Comment: Glucose reference range applies only to samples taken after fasting for at least 8 hours.  Glucose, capillary     Status: None   Collection Time: 02/17/22  4:11 PM  Result Value Ref Range   Glucose-Capillary 80 70 - 99 mg/dL    Comment: Glucose reference range applies only to samples taken after fasting for at least 8 hours.   Comment 1 Notify RN   Glucose, capillary     Status: Abnormal    Collection Time: 02/17/22  9:18 PM  Result Value Ref Range   Glucose-Capillary 154 (H) 70 - 99 mg/dL    Comment: Glucose reference range applies only to samples taken after fasting for at least 8 hours.  Glucose, capillary     Status: Abnormal   Collection Time: 02/18/22  6:32 AM  Result Value Ref Range   Glucose-Capillary 100 (H) 70 - 99 mg/dL    Comment: Glucose reference range applies only to samples taken after fasting for at least 8 hours.  Glucose, capillary     Status: Abnormal   Collection Time: 02/18/22 11:17 AM  Result Value Ref Range   Glucose-Capillary 104 (H) 70 - 99 mg/dL    Comment: Glucose reference range applies only to samples taken after fasting for at least 8 hours.   Comment 1 Notify RN   Glucose, capillary     Status: None   Collection Time: 02/18/22  4:25 PM  Result Value Ref Range   Glucose-Capillary 99 70 - 99 mg/dL    Comment: Glucose reference range applies only to samples taken after fasting for at least 8 hours.    Blood Alcohol level:  Lab Results  Component Value Date   ETH <10 02/02/2022   ETH <10 01/18/2022    Metabolic Disorder Labs: Lab Results  Component Value Date   HGBA1C 7.0 (H) 02/08/2022   MPG 154.2 02/08/2022   MPG 116.89 05/13/2020   No results found for: "PROLACTIN" Lab Results  Component Value Date   CHOL 98 02/08/2022   TRIG 60 02/08/2022   HDL 44 02/08/2022   CHOLHDL 2.2 02/08/2022   VLDL 12 02/08/2022   LDLCALC 42 02/08/2022    Physical Findings: AIMS:  , ,  ,  ,    CIWA:    COWS:     Musculoskeletal: Strength & Muscle Tone: within normal limits Gait & Station: normal Patient leans: N/A  Psychiatric Specialty Exam:  Presentation  General Appearance: Disheveled  Eye Contact:None  Speech:Garbled; Pressured  Speech Volume:Increased  Handedness:Right   Mood and Affect  Mood:Dysphoric; Irritable  Affect:Inappropriate; Full Range   Thought Process  Thought Processes:Disorganized  Descriptions of  Associations:Loose  Orientation:Partial  Thought Content:Illogical; Tangential; Scattered; Delusions; Paranoid Ideation  History of Schizophrenia/Schizoaffective disorder:Yes  Duration of Psychotic Symptoms:Greater than six months  Hallucinations:No data recorded Ideas of Reference:Paranoia  Suicidal Thoughts:No data recorded Homicidal Thoughts:No data recorded  Sensorium  Memory:Immediate Poor; Recent Poor; Remote Poor  Judgment:Impaired  Insight:Lacking   Executive Functions  Concentration:Poor  Attention Span:Poor  Recall:Poor  Fund of Knowledge:Poor  Language:Poor   Psychomotor Activity  Psychomotor Activity:No data recorded  Assets  Assets:Financial Resources/Insurance; Physical Health; Resilience; Social Support   Sleep  Sleep:No data recorded   Physical Exam: Physical Exam Vitals and nursing note reviewed.  Constitutional:      Appearance: Normal appearance.  HENT:     Head: Normocephalic and atraumatic.     Mouth/Throat:     Pharynx: Oropharynx is clear.  Eyes:     Pupils: Pupils are equal, round, and reactive to light.  Cardiovascular:     Rate and Rhythm: Normal rate and regular rhythm.  Pulmonary:     Effort: Pulmonary effort is normal.     Breath sounds: Normal breath sounds.  Abdominal:     General: Abdomen is flat.     Palpations: Abdomen is soft.  Musculoskeletal:        General: Normal range of motion.  Skin:    General: Skin is warm and dry.  Neurological:     General: No focal deficit present.     Mental Status: He is alert. Mental status is at baseline.  Psychiatric:        Attention and Perception: Attention normal.        Mood and Affect: Mood normal. Affect is blunt.        Speech: Speech is delayed.        Behavior: Behavior is slowed.        Thought Content: Thought content normal.    Review of Systems  Constitutional: Negative.   HENT: Negative.    Eyes: Negative.   Respiratory: Negative.    Cardiovascular:  Negative.   Gastrointestinal: Negative.   Musculoskeletal: Negative.   Skin: Negative.   Neurological: Negative.   Psychiatric/Behavioral: Negative.     Blood pressure 131/69, pulse 85, temperature 97.8 F (36.6 C), temperature source Oral, resp. rate 18, height 6\' 4"  (1.93 m), weight 109.3 kg, SpO2 100 %. Body mass index is 29.34 kg/m.   Treatment Plan Summary: Medication management and Plan I am going to restart the injectable Abilify long-acting today which he agrees to.  We will hold off on restarting any oral medicine.  I think we can start working on discharge planning soon.  There had been talk previously about a group home.  I am not sure patient has been able to converse about that yet we will have to follow up tomorrow.  , MD 02/18/2022, 4:29 PM

## 2022-02-18 NOTE — Plan of Care (Signed)

## 2022-02-18 NOTE — BH IP Treatment Plan (Signed)
Interdisciplinary Treatment and Diagnostic Plan Update  02/18/2022 Time of Session: 8:30AM Derrick Atkins MRN: [id number]  Principal Diagnosis: Schizophrenia, paranoid type [facility])  Secondary Diagnoses: Principal Problem:   Schizophrenia, paranoid type (HCC)   Current Medications:  Current Facility-Administered Medications  Medication Dose Route Frequency Provider Last Rate Last Admin   acetaminophen (TYLENOL) tablet 650 mg  650 mg Oral Q6H PRN Charm Rings, NP   650 mg at 02/17/22 2123   alum & mag hydroxide-simeth (MAALOX/MYLANTA) 200-200-20 MG/5ML suspension 30 mL  30 mL Oral Q4H PRN Charm Rings, NP       aspirin EC tablet 81 mg  81 mg Oral Daily Charm Rings, NP   81 mg at 02/18/22 5427   benztropine (COGENTIN) tablet 1 mg  1 mg Oral Daily [name], [name], MD   1 mg at 02/18/22 0809   clonazePAM (KLONOPIN) disintegrating tablet 0.5 mg  0.5 mg Oral BID PRN [name], [name], MD   0.5 mg at 02/17/22 1108   fenofibrate tablet 54 mg  54 mg Oral Daily Charm Rings, NP   54 mg at 02/18/22 0809   gemfibrozil (LOPID) tablet 600 mg  600 mg Oral BID Charm Rings, NP   600 mg at 02/18/22 0809   haloperidol (HALDOL) tablet 5 mg  5 mg Oral Q6H PRN [name], [name], MD   5 mg at 02/17/22 2124   Or   haloperidol lactate (HALDOL) injection 5 mg  5 mg Intramuscular Q6H PRN [name], [name], MD       hydrOXYzine (ATARAX) tablet 50 mg  50 mg Oral TID PRN [name], [name], MD   50 mg at 02/16/22 1700   insulin aspart (novoLOG) injection 0-15 Units  0-15 Units Subcutaneous TID WC [name], [name], MD   2 Units at 02/17/22 1115   levothyroxine (SYNTHROID) tablet 88 mcg  88 mcg Oral Daily Charm Rings, NP   88 mcg at 02/18/22 0623   magnesium hydroxide (MILK OF MAGNESIA) suspension 30 mL  30 mL Oral Daily PRN Charm Rings, NP   30 mL at 02/16/22 1045   metFORMIN (GLUCOPHAGE) tablet 1,000 mg  1,000 mg Oral BID WC Charm Rings, NP   1,000 mg at 02/18/22 0808   ondansetron (ZOFRAN) tablet 4  mg  4 mg Oral Q6H PRN Sarina Ill, DO   4 mg at 02/06/22 1755   pioglitazone (ACTOS) tablet 45 mg  45 mg Oral Daily Charm Rings, NP   45 mg at 02/18/22 0809   simvastatin (ZOCOR) tablet 20 mg  20 mg Oral QHS Charm Rings, NP   20 mg at 02/17/22 2124   temazepam (RESTORIL) capsule 15 mg  15 mg Oral QHS PRN [name], [name], MD   15 mg at 02/17/22 2124   temazepam (RESTORIL) capsule 15 mg  15 mg Oral QHS [name], [name], MD   15 mg at 02/17/22 2124   PTA Medications: Medications Prior to Admission  Medication Sig Dispense Refill Last Dose   ARIPiprazole (ABILIFY) 10 MG tablet Take 10 mg by mouth daily.      ARIPiprazole (ABILIFY) 20 MG tablet Take 1 tablet by mouth daily.  (Patient not taking: Reported on 02/02/2022)      aspirin 81 MG EC tablet Take 1 tablet by mouth daily.      buPROPion (WELLBUTRIN XL) 300 MG 24 hr tablet Take 300 mg by mouth daily. (Patient not taking: Reported on  02/02/2022)      buPROPion (WELLBUTRIN) 75 MG tablet Take 75 mg by mouth 2 (two) times daily.      cyclobenzaprine (FLEXERIL) 5 MG tablet Take 1 tablet (5 mg total) by mouth at bedtime. (Patient not taking: Reported on 01/18/2022) 30 tablet 0    divalproex (DEPAKOTE ER) 500 MG 24 hr tablet Take 1,000 mg by mouth at bedtime. (Patient not taking: Reported on 02/02/2022)      divalproex (DEPAKOTE) 500 MG DR tablet Take 500 mg by mouth 2 (two) times daily.      gemfibrozil (LOPID) 600 MG tablet Take 600 mg by mouth 2 (two) times daily.      Insulin Lispro Prot & Lispro (HUMALOG 75/25 MIX) (75-25) 100 UNIT/ML Kwikpen Inject into the skin.      levothyroxine (SYNTHROID, LEVOTHROID) 88 MCG tablet Take 88 mcg by mouth daily.      lidocaine (LIDODERM) 5 % Place 1 patch onto the skin every 12 (twelve) hours. Remove & Discard patch within 12 hours or as directed by MD (Patient not taking: Reported on 01/18/2022) 15 patch 0    lisinopril (PRINIVIL,ZESTRIL) 10 MG tablet Take 1 tablet by mouth daily.      metFORMIN  (GLUCOPHAGE) 1000 MG tablet Take 1,000 mg by mouth 2 (two) times daily with a meal.      metoprolol succinate (TOPROL-XL) 50 MG 24 hr tablet Take 50 mg by mouth daily. (Patient not taking: Reported on 02/02/2022)      naproxen (NAPROSYN) 500 MG tablet Take 1 tablet (500 mg total) by mouth 2 (two) times daily with a meal. (Patient not taking: Reported on 01/18/2022) 20 tablet 0    NOVOLOG MIX 70/30 FLEXPEN (70-30) 100 UNIT/ML FlexPen Inject 34-44 Units into the skin 2 (two) times daily. 34 units-am 44units-pm      pioglitazone (ACTOS) 45 MG tablet Take 45 mg by mouth daily.      polyethylene glycol (MIRALAX / GLYCOLAX) 17 g packet Take 17 g by mouth daily. (Patient not taking: Reported on 08/30/2021) 14 each 0    simvastatin (ZOCOR) 20 MG tablet Take 20 mg by mouth at bedtime.       Patient Stressors: Financial difficulties   Health problems    Patient Strengths: Motivation for treatment/growth   Treatment Modalities: Medication Management, Group therapy, Case management,  1 to 1 session with clinician, Psychoeducation, Recreational therapy.   Physician Treatment Plan for Primary Diagnosis: Schizophrenia, paranoid type (HCC) Long Term Goal(s): Improvement in symptoms so as ready for discharge   Short Term Goals: Ability to identify changes in lifestyle to reduce recurrence of condition will improve Ability to verbalize feelings will improve Ability to disclose and discuss suicidal ideas Ability to demonstrate self-control will improve Ability to identify and develop effective coping behaviors will improve Ability to maintain clinical measurements within normal limits will improve Compliance with prescribed medications will improve Ability to identify triggers associated with substance abuse/mental health issues will improve  Medication Management: Evaluate patient's response, side effects, and tolerance of medication regimen.  Therapeutic Interventions: 1 to 1 sessions, Unit Group  sessions and Medication administration.  Evaluation of Outcomes: Progressing  Physician Treatment Plan for Secondary Diagnosis: Principal Problem:   Schizophrenia, paranoid type (HCC)  Long Term Goal(s): Improvement in symptoms so as ready for discharge   Short Term Goals: Ability to identify changes in lifestyle to reduce recurrence of condition will improve Ability to verbalize feelings will improve Ability to disclose and discuss suicidal ideas Ability  to demonstrate self-control will improve Ability to identify and develop effective coping behaviors will improve Ability to maintain clinical measurements within normal limits will improve Compliance with prescribed medications will improve Ability to identify triggers associated with substance abuse/mental health issues will improve     Medication Management: Evaluate patient's response, side effects, and tolerance of medication regimen.  Therapeutic Interventions: 1 to 1 sessions, Unit Group sessions and Medication administration.  Evaluation of Outcomes: Progressing   RN Treatment Plan for Primary Diagnosis: Schizophrenia, paranoid type (HCC) Long Term Goal(s): Knowledge of disease and therapeutic regimen to maintain health will improve  Short Term Goals: Ability to remain free from injury will improve, Ability to verbalize frustration and anger appropriately will improve, Ability to demonstrate self-control, Ability to participate in decision making will improve, Ability to verbalize feelings will improve, Ability to disclose and discuss suicidal ideas, Ability to identify and develop effective coping behaviors will improve, and Compliance with prescribed medications will improve  Medication Management: RN will administer medications as ordered by provider, will assess and evaluate patient's response and provide education to patient for prescribed medication. RN will report any adverse and/or side effects to prescribing  provider.  Therapeutic Interventions: 1 on 1 counseling sessions, Psychoeducation, Medication administration, Evaluate responses to treatment, Monitor vital signs and CBGs as ordered, Perform/monitor CIWA, COWS, AIMS and Fall Risk screenings as ordered, Perform wound care treatments as ordered.  Evaluation of Outcomes: Progressing   LCSW Treatment Plan for Primary Diagnosis: Schizophrenia, paranoid type (HCC) Long Term Goal(s): Safe transition to appropriate next level of care at discharge, Engage patient in therapeutic group addressing interpersonal concerns.  Short Term Goals: Engage patient in aftercare planning with referrals and resources, Increase social support, Increase ability to appropriately verbalize feelings, Increase emotional regulation, Facilitate acceptance of mental health diagnosis and concerns, and Increase skills for wellness and recovery  Therapeutic Interventions: Assess for all discharge needs, 1 to 1 time with Social worker, Explore available resources and support systems, Assess for adequacy in community support network, Educate family and significant other(s) on suicide prevention, Complete Psychosocial Assessment, Interpersonal group therapy.  Evaluation of Outcomes: Progressing   Progress in Treatment: Attending groups: Yes. Participating in groups: Yes. and No. Taking medication as prescribed: Yes. Toleration medication: Yes. Family/Significant other contact made: Yes, individual(s) contacted:  SPE completed with patient's guardian. Patient understands diagnosis: No. Discussing patient identified problems/goals with staff: No. Medical problems stabilized or resolved: Yes. Denies suicidal/homicidal ideation: No. Issues/concerns per patient self-inventory: No. Other: none.  New problem(s) identified: No, Describe:  None Update 02/15/22: No changes at this time. Update 02/18/22: No changes at this time.    New Short Term/Long Term Goal(s): Patient to work  towards elimination of symptoms of psychosis, medication management for mood stabilization; development of comprehensive mental wellness plan. Update 02/15/22: No changes at this time. Update 02/18/22: No changes at this time.    Patient Goals:  Patient was unable to provide goals during treatment team. Update 02/15/22: No changes at this time. Update 02/18/22: No changes at this time.   Discharge Plan or Barriers:  CSW will assist pt with development of appropriate discharge/aftercare plan. Update 02/15/22: No changes at this time. Update 02/18/22: No changes at this time.   Reason for Continuation of Hospitalization: Delusions  Hallucinations Mania Medication stabilization   Estimated Length of Stay:  TBD Update 02/15/22: No changes at this time. Update 02/18/22: No changes at this time.  Last 3 [place] Suicide Severity Risk Score: Flowsheet Row Admission (  Current) from 02/05/2022 in [name] INPATIENT BEHAVIORAL MEDICINE ED from 02/02/2022 in [facility] EMERGENCY DEPARTMENT ED from 01/18/2022 in [facility] EMERGENCY DEPARTMENT  C-SSRS RISK CATEGORY No Risk No Risk No Risk       Last PHQ 2/9 Scores:     No data to display          Scribe for Treatment Team: Glenis Smoker, LCSW 02/18/2022 9:10 AM

## 2022-02-18 NOTE — Group Note (Signed)
[facility] LCSW Group Therapy Note   Group Date: 02/18/2022 Start Time: 1300 End Time: 1400   Type of Therapy/Topic:  Group Therapy:  Balance in Life  Participation Level:  None   Description of Group:    This group will address the concept of balance and how it feels and looks when one is unbalanced. Patients will be encouraged to process areas in their lives that are out of balance, and identify reasons for remaining unbalanced. Facilitators will guide patients utilizing problem- solving interventions to address and correct the stressor making their life unbalanced. Understanding and applying boundaries will be explored and addressed for obtaining  and maintaining a balanced life. Patients will be encouraged to explore ways to assertively make their unbalanced needs known to significant others in their lives, using other group members and facilitator for support and feedback.  Therapeutic Goals: Patient will identify two or more emotions or situations they have that consume much of in their lives. Patient will identify signs/triggers that life has become out of balance:  Patient will identify two ways to set boundaries in order to achieve balance in their lives:  Patient will demonstrate ability to communicate their needs through discussion and/or role plays  Summary of Patient Progress: Patient was present for the entirety of the meeting. He did not contribute to the discussion.    Therapeutic Modalities:   Cognitive Behavioral Therapy Solution-Focused Therapy Assertiveness Training   Glenis Smoker, LCSW

## 2022-02-18 NOTE — Progress Notes (Signed)
Overall he is improving. Speech and body mechanics are better.  He is spending less time in his room and out more in the dayroom trying to interact with his peers. He denies si/hi/avh. He does endorse depression and anxiety but reports it is because he is in here and he wants to go. He says he needs to go [address] and he has it wrote down in the [employer] that was given to him yesterday. He does endorse having a headache to which Tylenol was given to help with pain, rated at 6/10.  He does still have difficulty getting his words out. Appears to be having some thought blocking or responding to internal stimuli to which he denies. He remains safe on the unit with q 15 minute safety checks. Encouraged him to seek staff with any concerns that he may have.       C Butler-Nicholson, LPN

## 2022-02-18 NOTE — Plan of Care (Signed)
D: Pt alert and oriented x 3. Pt rates depression 0/10, hopelessness 0/10, and anxiety 0/10. Pt reports energy level as low and concentration as being poor. Pt reports sleep last night as being poor. Pt denies experiencing any pain at this time. Pt denies experiencing any SI/HI, or AVH at this time however, can be observed responding to internal stimuli at times.   At 1520 pt was standing outside of nurse's station directly located in front of this Clinical research associate. This Clinical research associate politely waved at pt, to which he waved back and then flicked his tongue at this Clinical research associate in a sexually manner.   At 1527 pt was blocking nurse's station door, staring intensely inside while leaning facing forward and arms on either side. Pt directed to back away from the door. Pt stated why. Pt directed again firmly to back away for the door. Pt finally moved away from door and was educated on appropriated and expected behavior on this unit.  A: Scheduled medications administered to pt, per MD orders. Support and encouragement provided. Frequent verbal contact made. Routine safety checks conducted q15 minutes.   R: No adverse drug reactions noted. Pt verbally contracts for safety at this time. Pt compliant with medications. Pt interacts minimally with others on the unit. Pt remains safe at this time. Will continue to monitor.   Problem: Nutrition: Goal: Adequate nutrition will be maintained Outcome: Progressing   Problem: Coping: Goal: Level of anxiety will decrease Outcome: Progressing

## 2022-02-19 DIAGNOSIS — F200 Paranoid schizophrenia: Secondary | ICD-10-CM | POA: Diagnosis not present

## 2022-02-19 LAB — GLUCOSE, CAPILLARY
Glucose-Capillary: 160 mg/dL — ABNORMAL HIGH (ref 70–99)
Glucose-Capillary: 119 mg/dL — ABNORMAL HIGH (ref 70–99)
Glucose-Capillary: 109 mg/dL — ABNORMAL HIGH (ref 70–99)
Glucose-Capillary: 125 mg/dL — ABNORMAL HIGH (ref 70–99)

## 2022-02-19 NOTE — Group Note (Signed)
[facility] LCSW Group Therapy Note   Group Date: 02/19/2022 Start Time: 1300 End Time: 1400  Type of Therapy and Topic:  Group Therapy:  Feelings around Relapse and Recovery  Participation Level:  Did Not Attend    Description of Group:    Patients in this group will discuss emotions they experience before and after a relapse. They will process how experiencing these feelings, or avoidance of experiencing them, relates to having a relapse. Facilitator will guide patients to explore emotions they have related to recovery. Patients will be encouraged to process which emotions are more powerful. They will be guided to discuss the emotional reaction significant others in their lives may have to patients' relapse or recovery. Patients will be assisted in exploring ways to respond to the emotions of others without this contributing to a relapse.  Therapeutic Goals: Patient will identify two or more emotions that lead to relapse for them:  Patient will identify two emotions that result when they relapse:  Patient will identify two emotions related to recovery:  Patient will demonstrate ability to communicate their needs through discussion and/or role plays.   Summary of Patient Progress: Pt did not attend group despite invitation from CSW.   Therapeutic Modalities:   Cognitive Behavioral Therapy Solution-Focused Therapy Assertiveness Training Relapse Prevention Therapy   Zareth Rippetoe R Luka Stohr, LCSW 

## 2022-02-19 NOTE — Plan of Care (Signed)
  Problem: Education: Goal: Knowledge of General Education information will improve Description: Including pain rating scale, medication(s)/side effects and non-pharmacologic comfort measures Outcome: Not Progressing   Problem: Health Behavior/Discharge Planning: Goal: Ability to manage health-related needs will improve Outcome: Not Progressing   Problem: Clinical Measurements: Goal: Ability to maintain clinical measurements within normal limits will improve Outcome: Not Progressing Goal: Will remain free from infection Outcome: Not Progressing Goal: Cardiovascular complication will be avoided Outcome: Not Progressing   Problem: Education: Goal: Ability to describe self-care measures that may prevent or decrease complications (Diabetes Survival Skills Education) will improve Outcome: Not Progressing Goal: Individualized Educational Video(s) Outcome: Not Progressing   Problem: Self-Concept: Goal: Ability to identify factors that promote anxiety will improve Outcome: Not Progressing Goal: Level of anxiety will decrease Outcome: Not Progressing Goal: Ability to modify response to factors that promote anxiety will improve Outcome: Not Progressing   Problem: Coping: Goal: Ability to identify and develop effective coping behavior will improve Outcome: Not Progressing   Problem: Education: Goal: Ability to state activities that reduce stress will improve Outcome: Not Progressing   Problem: Self-Concept: Goal: Will verbalize positive feelings about self Outcome: Not Progressing Goal: Level of anxiety will decrease Outcome: Not Progressing

## 2022-02-19 NOTE — Progress Notes (Signed)
He is improving. Less thought blocking.  More social asking for needed items like medication and coffee.  He is med compliant.  Still with some confusion, but easily redirectable.  Will continue  to monitor with q15 minute safety checks.       C Butler-Nicholson, LPN

## 2022-02-19 NOTE — Plan of Care (Addendum)
Pt is alert to self, flat affect, slow gait observed. Speech is mumbled, denies SI/HI. Complaint with morning medications. Blood sugar 119, no s/s of hypo/hyperglycemia. Pt is visible on unit, safety rounds continued. 1623- Pt is irritable,Pt refused blood sugar, pt is currently eating dinner. 1645-Pt is refusing evening medications. 1717-Pt is restless, agitated, and diaphoretic. IM Haldol 5mg  given in Lt deltoid without difficulties.  Problem: Activity: Goal: Risk for activity intolerance will decrease Outcome: Progressing   Problem: Nutrition: Goal: Adequate nutrition will be maintained Outcome: Progressing   Problem: Coping: Goal: Level of anxiety will decrease Outcome: Progressing   Problem: Elimination: Goal: Will not experience complications related to bowel motility Outcome: Progressing   Problem: Safety: Goal: Ability to remain free from injury will improve Outcome: Progressing

## 2022-02-19 NOTE — Progress Notes (Signed)
[facility] MD Progress Note  02/19/2022 4:24 PM Derrick Atkins  MRN:  [id number] Subjective: Patient seen.  Patient seems more lucid today.  Able to hold a reasonably calm and appropriate conversation.  Denies hallucinations.  Seems physically more stable Principal Problem: Schizophrenia, paranoid type (HCC) Diagnosis: Principal Problem:   Schizophrenia, paranoid type (HCC)  Total Time spent with patient: 30 minutes  Past Psychiatric History: Past history of schizophrenia  Past Medical History:  Past Medical History:  Diagnosis Date   Cerebral palsy (HCC)    Depression    Diabetes mellitus without complication (HCC)    Hypertension    Kidney stones    Schizoaffective disorder (HCC)    Scoliosis     Past Surgical History:  Procedure Laterality Date   BOWEL RESECTION     LAPAROTOMY N/A 05/15/2020   Procedure: EXPLORATORY LAPAROTOMY;  Surgeon: Henrene Dodge, MD;  Location: [facility] ORS;  Service: General;  Laterality: N/A;   Family History:  Family History  Problem Relation Age of Onset   Heart disease Father    Heart attack Father    Family Psychiatric  History: See previous Social History:  Social History   Substance and Sexual Activity  Alcohol Use No     Social History   Substance and Sexual Activity  Drug Use No    Social History   Socioeconomic History   Marital status: Single    Spouse name: Not on file   Number of children: Not on file   Years of education: Not on file   Highest education level: Not on file  Occupational History   Not on file  Tobacco Use   Smoking status: Never   Smokeless tobacco: Never  Vaping Use   Vaping Use: Never used  Substance and Sexual Activity   Alcohol use: No   Drug use: No   Sexual activity: Not Currently  Other Topics Concern   Not on file  Social History Narrative   Not on file   Social Determinants of Health   Financial Resource Strain: Not on file  Food Insecurity: Not on file  Transportation Needs: Not on file   Physical Activity: Not on file  Stress: Not on file  Social Connections: Not on file   Additional Social History:                         Sleep: Fair  Appetite:  Negative  Current Medications: Current Facility-Administered Medications  Medication Dose Route Frequency Provider Last Rate Last Admin   acetaminophen (TYLENOL) tablet 650 mg  650 mg Oral Q6H PRN Charm Rings, NP   650 mg at 02/17/22 2123   alum & mag hydroxide-simeth (MAALOX/MYLANTA) 200-200-20 MG/5ML suspension 30 mL  30 mL Oral Q4H PRN Charm Rings, NP       ARIPiprazole ER (ABILIFY MAINTENA) injection 400 mg  400 mg Intramuscular Q28 days Mishaal Lansdale T, MD   400 mg at 02/18/22 1416   aspirin EC tablet 81 mg  81 mg Oral Daily Charm Rings, NP   81 mg at 02/19/22 0815   benztropine (COGENTIN) tablet 1 mg  1 mg Oral Daily Heily Carlucci T, MD   1 mg at 02/19/22 0815   clonazePAM (KLONOPIN) disintegrating tablet 0.5 mg  0.5 mg Oral BID PRN Nadir Vasques, [name], MD   0.5 mg at 02/18/22 2124   fenofibrate tablet 54 mg  54 mg Oral Daily Charm Rings, NP  54 mg at 02/19/22 0815   gemfibrozil (LOPID) tablet 600 mg  600 mg Oral BID Charm Rings, NP   600 mg at 02/19/22 4580   haloperidol (HALDOL) tablet 5 mg  5 mg Oral Q6H PRN Akiva Brassfield, [name], MD   5 mg at 02/17/22 2124   Or   haloperidol lactate (HALDOL) injection 5 mg  5 mg Intramuscular Q6H PRN Sevin Langenbach, [name], MD       hydrOXYzine (ATARAX) tablet 50 mg  50 mg Oral TID PRN Lorrie Strauch T, MD   50 mg at 02/18/22 1356   insulin aspart (novoLOG) injection 0-15 Units  0-15 Units Subcutaneous TID WC Ezrael Sam, [name], MD   2 Units at 02/17/22 1115   levothyroxine (SYNTHROID) tablet 88 mcg  88 mcg Oral Daily Charm Rings, NP   88 mcg at 02/19/22 0641   magnesium hydroxide (MILK OF MAGNESIA) suspension 30 mL  30 mL Oral Daily PRN Charm Rings, NP   30 mL at 02/18/22 0934   metFORMIN (GLUCOPHAGE) tablet 1,000 mg  1,000 mg Oral BID WC Charm Rings, NP    1,000 mg at 02/19/22 0815   ondansetron (ZOFRAN) tablet 4 mg  4 mg Oral Q6H PRN Sarina Ill, DO   4 mg at 02/06/22 1755   pioglitazone (ACTOS) tablet 45 mg  45 mg Oral Daily Charm Rings, NP   45 mg at 02/19/22 0815   simvastatin (ZOCOR) tablet 20 mg  20 mg Oral QHS Charm Rings, NP   20 mg at 02/18/22 2125   temazepam (RESTORIL) capsule 15 mg  15 mg Oral QHS PRN Lizet Kelso, [name], MD   15 mg at 02/18/22 2124   temazepam (RESTORIL) capsule 15 mg  15 mg Oral QHS Dorrance Sellick, [name], MD   15 mg at 02/18/22 2124    Lab Results:  Results for orders placed or performed during the hospital encounter of 02/05/22 (from the past 48 hour(s))  Glucose, capillary     Status: Abnormal   Collection Time: 02/17/22  9:18 PM  Result Value Ref Range   Glucose-Capillary 154 (H) 70 - 99 mg/dL    Comment: Glucose reference range applies only to samples taken after fasting for at least 8 hours.  Glucose, capillary     Status: Abnormal   Collection Time: 02/18/22  6:32 AM  Result Value Ref Range   Glucose-Capillary 100 (H) 70 - 99 mg/dL    Comment: Glucose reference range applies only to samples taken after fasting for at least 8 hours.  Glucose, capillary     Status: Abnormal   Collection Time: 02/18/22 11:17 AM  Result Value Ref Range   Glucose-Capillary 104 (H) 70 - 99 mg/dL    Comment: Glucose reference range applies only to samples taken after fasting for at least 8 hours.   Comment 1 Notify RN   Glucose, capillary     Status: None   Collection Time: 02/18/22  4:25 PM  Result Value Ref Range   Glucose-Capillary 99 70 - 99 mg/dL    Comment: Glucose reference range applies only to samples taken after fasting for at least 8 hours.  Glucose, capillary     Status: Abnormal   Collection Time: 02/19/22  6:49 AM  Result Value Ref Range   Glucose-Capillary 160 (H) 70 - 99 mg/dL    Comment: Glucose reference range applies only to samples taken after fasting for at least 8 hours.  Glucose,  capillary  Status: Abnormal   Collection Time: 02/19/22  7:52 AM  Result Value Ref Range   Glucose-Capillary 119 (H) 70 - 99 mg/dL    Comment: Glucose reference range applies only to samples taken after fasting for at least 8 hours.  Glucose, capillary     Status: Abnormal   Collection Time: 02/19/22 11:08 AM  Result Value Ref Range   Glucose-Capillary 109 (H) 70 - 99 mg/dL    Comment: Glucose reference range applies only to samples taken after fasting for at least 8 hours.    Blood Alcohol level:  Lab Results  Component Value Date   ETH <10 02/02/2022   ETH <10 01/18/2022    Metabolic Disorder Labs: Lab Results  Component Value Date   HGBA1C 7.0 (H) 02/08/2022   MPG 154.2 02/08/2022   MPG 116.89 05/13/2020   No results found for: "PROLACTIN" Lab Results  Component Value Date   CHOL 98 02/08/2022   TRIG 60 02/08/2022   HDL 44 02/08/2022   CHOLHDL 2.2 02/08/2022   VLDL 12 02/08/2022   LDLCALC 42 02/08/2022    Physical Findings: AIMS:  , ,  ,  ,    CIWA:    COWS:     Musculoskeletal: Strength & Muscle Tone: within normal limits Gait & Station: normal Patient leans: N/A  Psychiatric Specialty Exam:  Presentation  General Appearance: Disheveled  Eye Contact:None  Speech:Garbled; Pressured  Speech Volume:Increased  Handedness:Right   Mood and Affect  Mood:Dysphoric; Irritable  Affect:Inappropriate; Full Range   Thought Process  Thought Processes:Disorganized  Descriptions of Associations:Loose  Orientation:Partial  Thought Content:Illogical; Tangential; Scattered; Delusions; Paranoid Ideation  History of Schizophrenia/Schizoaffective disorder:Yes  Duration of Psychotic Symptoms:Greater than six months  Hallucinations:No data recorded Ideas of Reference:Paranoia  Suicidal Thoughts:No data recorded Homicidal Thoughts:No data recorded  Sensorium  Memory:Immediate Poor; Recent Poor; Remote  Poor  Judgment:Impaired  Insight:Lacking   Executive Functions  Concentration:Poor  Attention Span:Poor  Recall:Poor  Fund of Knowledge:Poor  Language:Poor   Psychomotor Activity  Psychomotor Activity:No data recorded  Assets  Assets:Financial Resources/Insurance; Physical Health; Resilience; Social Support   Sleep  Sleep:No data recorded   Physical Exam: Physical Exam Vitals and nursing note reviewed.  Constitutional:      Appearance: Normal appearance.  HENT:     Head: Normocephalic and atraumatic.     Mouth/Throat:     Pharynx: Oropharynx is clear.  Eyes:     Pupils: Pupils are equal, round, and reactive to light.  Cardiovascular:     Rate and Rhythm: Normal rate and regular rhythm.  Pulmonary:     Effort: Pulmonary effort is normal.     Breath sounds: Normal breath sounds.  Abdominal:     General: Abdomen is flat.     Palpations: Abdomen is soft.  Musculoskeletal:        General: Normal range of motion.  Skin:    General: Skin is warm and dry.  Neurological:     General: No focal deficit present.     Mental Status: He is alert. Mental status is at baseline.  Psychiatric:        Mood and Affect: Mood normal.        Thought Content: Thought content normal.    Review of Systems  Constitutional: Negative.   HENT: Negative.    Eyes: Negative.   Respiratory: Negative.    Cardiovascular: Negative.   Gastrointestinal: Negative.   Musculoskeletal: Negative.   Skin: Negative.   Neurological: Negative.   Psychiatric/Behavioral: Negative.  Blood pressure 134/78, pulse 95, temperature 98.9 F (37.2 C), temperature source Oral, resp. rate 18, height 6\' 4"  (1.93 m), weight 109.3 kg, SpO2 100 %. Body mass index is 29.34 kg/m.   Treatment Plan Summary: Plan now he is on a long-acting injectable no change to that.  I brought up with him the suggestion that his guardian has had about getting him into a group home.  He is very opposed to it does not  even want to discuss it.  May be looking at discharge back to his previous environment after the weekend  , MD 02/19/2022, 4:24 PM

## 2022-02-20 DIAGNOSIS — F200 Paranoid schizophrenia: Secondary | ICD-10-CM | POA: Diagnosis not present

## 2022-02-20 LAB — GLUCOSE, CAPILLARY
Glucose-Capillary: 97 mg/dL (ref 70–99)
Glucose-Capillary: 92 mg/dL (ref 70–99)
Glucose-Capillary: 111 mg/dL — ABNORMAL HIGH (ref 70–99)
Glucose-Capillary: 105 mg/dL — ABNORMAL HIGH (ref 70–99)

## 2022-02-20 NOTE — Group Note (Signed)
[facility] LCSW Group Therapy Note   Group Date: 02/20/2022 Start Time: 0950 End Time: 1050   Type of Therapy and Topic: Group Therapy: Avoiding Self-Sabotaging and Enabling Behaviors  Participation Level: None   Description of Group:  In this group, patients will learn how to identify obstacles, self-sabotaging and enabling behaviors, as well as: what are they, why do we do them and what needs these behaviors meet. Discuss unhealthy relationships and how to have positive healthy boundaries with those that sabotage and enable. Explore aspects of self-sabotage and enabling in yourself and how to limit these self-destructive behaviors in everyday life.   Therapeutic Goals: 1. Patient will identify one obstacle that relates to self-sabotage and enabling behaviors 2. Patient will identify one personal self-sabotaging or enabling behavior they did prior to admission 3. Patient will state a plan to change the above identified behavior 4. Patient will demonstrate ability to communicate their needs through discussion and/or role play.    Summary of Patient Progress: Patient was present for the entirety of the group process. He participated in the [address] identifying Lance Muss as his favorite food. Outside of this pt did not participate in the discussion.    Therapeutic Modalities:  Cognitive Behavioral Therapy Person-Centered Therapy Motivational Interviewing    Glenis Smoker, LCSW

## 2022-02-20 NOTE — Plan of Care (Signed)
D- Patient alert and oriented to person, place, and situation. Patient presented in a pleasant mood on assessment stating that he slept "alright" last night and it was reported that he slept all night from the previous shift nurse. Patient had no complaints to voice to this Clinical research associate. Patient endorsed both depression and anxiety, stating that he is "scared about my brother", and that he gets up "early because I'm worried if someone is going to scare me". Patient denied SI, HI, and pain at this time. When asked about AVH, patient stated "I'm not really sure, they're not as bad as they have been". Patient had no stated goals for today.  A- Scheduled medications administered to patient, per MD orders. Support and encouragement provided.  Routine safety checks conducted every 15 minutes.  Patient informed to notify staff with problems or concerns.  R- No adverse drug reactions noted. Patient contracts for safety at this time. Patient compliant with medications and treatment plan. Patient receptive, calm, and cooperative. Patient interacts well with others on the unit.  Patient remains safe at this time.  Problem: Education: Goal: Knowledge of General Education information will improve Description: Including pain rating scale, medication(s)/side effects and non-pharmacologic comfort measures Outcome: Progressing   Problem: Health Behavior/Discharge Planning: Goal: Ability to manage health-related needs will improve Outcome: Progressing   Problem: Clinical Measurements: Goal: Ability to maintain clinical measurements within normal limits will improve Outcome: Progressing Goal: Will remain free from infection Outcome: Progressing Goal: Cardiovascular complication will be avoided Outcome: Progressing   Problem: Activity: Goal: Risk for activity intolerance will decrease Outcome: Progressing   Problem: Nutrition: Goal: Adequate nutrition will be maintained Outcome: Progressing   Problem:  Coping: Goal: Level of anxiety will decrease Outcome: Progressing   Problem: Elimination: Goal: Will not experience complications related to bowel motility Outcome: Progressing Goal: Will not experience complications related to urinary retention Outcome: Progressing   Problem: Pain Managment: Goal: General experience of comfort will improve Outcome: Progressing   Problem: Safety: Goal: Ability to remain free from injury will improve Outcome: Progressing   Problem: Skin Integrity: Goal: Risk for impaired skin integrity will decrease Outcome: Progressing   Problem: Education: Goal: Knowledge of [place] [facility] Education information/materials will improve Outcome: Progressing Goal: Emotional status will improve Outcome: Progressing Goal: Mental status will improve Outcome: Progressing Goal: Verbalization of understanding the information provided will improve Outcome: Progressing   Problem: Activity: Goal: Interest or engagement in activities will improve Outcome: Progressing Goal: Sleeping patterns will improve Outcome: Progressing   Problem: Coping: Goal: Ability to verbalize frustrations and anger appropriately will improve Outcome: Progressing Goal: Ability to demonstrate self-control will improve Outcome: Progressing   Problem: Health Behavior/Discharge Planning: Goal: Identification of resources available to assist in meeting health care needs will improve Outcome: Progressing Goal: Compliance with treatment plan for underlying cause of condition will improve Outcome: Progressing   Problem: Physical Regulation: Goal: Ability to maintain clinical measurements within normal limits will improve Outcome: Progressing   Problem: Safety: Goal: Periods of time without injury will increase Outcome: Progressing   Problem: Education: Goal: Utilization of techniques to improve thought processes will improve Outcome: Progressing Goal: Knowledge of the prescribed  therapeutic regimen will improve Outcome: Progressing   Problem: Activity: Goal: Interest or engagement in leisure activities will improve Outcome: Progressing Goal: Imbalance in normal sleep/wake cycle will improve Outcome: Progressing   Problem: Coping: Goal: Coping ability will improve Outcome: Progressing Goal: Will verbalize feelings Outcome: Progressing   Problem: Health Behavior/Discharge Planning: Goal: Ability  to make decisions will improve Outcome: Progressing Goal: Compliance with therapeutic regimen will improve Outcome: Progressing   Problem: Role Relationship: Goal: Will demonstrate positive changes in social behaviors and relationships Outcome: Progressing   Problem: Safety: Goal: Ability to disclose and discuss suicidal ideas will improve Outcome: Progressing Goal: Ability to identify and utilize support systems that promote safety will improve Outcome: Progressing   Problem: Self-Concept: Goal: Will verbalize positive feelings about self Outcome: Progressing Goal: Level of anxiety will decrease Outcome: Progressing   Problem: Education: Goal: Ability to state activities that reduce stress will improve Outcome: Progressing   Problem: Coping: Goal: Ability to identify and develop effective coping behavior will improve Outcome: Progressing   Problem: Self-Concept: Goal: Ability to identify factors that promote anxiety will improve Outcome: Progressing Goal: Level of anxiety will decrease Outcome: Progressing Goal: Ability to modify response to factors that promote anxiety will improve Outcome: Progressing   Problem: Education: Goal: Ability to describe self-care measures that may prevent or decrease complications (Diabetes Survival Skills Education) will improve Outcome: Progressing Goal: Individualized Educational Video(s) Outcome: Progressing   Problem: Coping: Goal: Ability to adjust to condition or change in health will improve Outcome:  Progressing   Problem: Fluid Volume: Goal: Ability to maintain a balanced intake and output will improve Outcome: Progressing

## 2022-02-20 NOTE — Progress Notes (Signed)
Patient compliant with medications continues to stand at the Nurse station staring. Denies SI/HI/A/VH and verbally  contracts for safety. Support and encouragement provided.

## 2022-02-20 NOTE — Progress Notes (Signed)
[facility] MD Progress Note  02/20/2022 12:53 PM Derrick Atkins  MRN:  [id number]  Principal Problem: Schizophrenia, paranoid type [facility]) Diagnosis: Principal Problem:   Schizophrenia, paranoid type ([place])  Patient is a  51y.o. male with schizophrenia who presents to the [facility] unit due to worsened psychosis.    Interval History Patient was seen today for re-evaluation.  Nursing reports no events overnight. The patient has no issues with performing ADLs.  Patient has been medication compliant.    Subjective:  On assessment patient reports he is feeling very anxious due to not wanting go to back to his previous group home. Says he had a good visit with his brother last night, he hopes he will be able to live with brother and get some electronic game for Christmas. He denies suicidal/homicidal ideations. Denies auditory/visual hallucinations. The patient reports no side effects from medications.   Keeps perseverating re refusal to return back to his previous group home.  Labs: no new results for review.      Total Time spent with patient: 30 minutes  Past Psychiatric History: Schizophrenia  Past Medical History:  Past Medical History:  Diagnosis Date   Cerebral palsy ([place])    Depression    Diabetes mellitus without complication ([place])    Hypertension    Kidney stones    Schizoaffective disorder ([place])    Scoliosis     Past Surgical History:  Procedure Laterality Date   BOWEL RESECTION     LAPAROTOMY N/A 05/15/2020   Procedure: EXPLORATORY LAPAROTOMY;  Surgeon: Olean Ree, MD;  Location: [facility] ORS;  Service: General;  Laterality: N/A;   Family History:  Family History  Problem Relation Age of Onset   Heart disease Father    Heart attack Father    Family Psychiatric  History:  Social History:  Social History   Substance and Sexual Activity  Alcohol Use No     Social History   Substance and Sexual Activity  Drug Use No    Social History   Socioeconomic History    Marital status: Single    Spouse name: Not on file   Number of children: Not on file   Years of education: Not on file   Highest education level: Not on file  Occupational History   Not on file  Tobacco Use   Smoking status: Never   Smokeless tobacco: Never  Vaping Use   Vaping Use: Never used  Substance and Sexual Activity   Alcohol use: No   Drug use: No   Sexual activity: Not Currently  Other Topics Concern   Not on file  Social History Narrative   Not on file   Social Determinants of Health   Financial Resource Strain: Not on file  Food Insecurity: Not on file  Transportation Needs: Not on file  Physical Activity: Not on file  Stress: Not on file  Social Connections: Not on file   Additional Social History:                         Sleep: Good  Appetite:  Good  Current Medications: Current Facility-Administered Medications  Medication Dose Route Frequency Provider Last Rate Last Admin   acetaminophen (TYLENOL) tablet 650 mg  650 mg Oral Q6H PRN Patrecia Pour, NP   650 mg at 02/17/22 2123   alum & mag hydroxide-simeth (MAALOX/MYLANTA) 200-200-20 MG/5ML suspension 30 mL  30 mL Oral Q4H PRN Patrecia Pour, NP  ARIPiprazole ER (ABILIFY MAINTENA) injection 400 mg  400 mg Intramuscular Q28 days [name], [name], MD   400 mg at 02/18/22 1416   aspirin EC tablet 81 mg  81 mg Oral Daily Charm Rings, NP   81 mg at 02/20/22 0835   benztropine (COGENTIN) tablet 1 mg  1 mg Oral Daily [name], [name], MD   1 mg at 02/20/22 0835   clonazePAM (KLONOPIN) disintegrating tablet 0.5 mg  0.5 mg Oral BID PRN [name], [name], MD   0.5 mg at 02/18/22 2124   fenofibrate tablet 54 mg  54 mg Oral Daily Charm Rings, NP   54 mg at 02/20/22 0835   gemfibrozil (LOPID) tablet 600 mg  600 mg Oral BID Charm Rings, NP   600 mg at 02/20/22 0835   haloperidol (HALDOL) tablet 5 mg  5 mg Oral Q6H PRN [name], [name], MD   5 mg at 02/17/22 2124   Or   haloperidol lactate  (HALDOL) injection 5 mg  5 mg Intramuscular Q6H PRN [name], [name], MD   5 mg at 02/19/22 1712   hydrOXYzine (ATARAX) tablet 50 mg  50 mg Oral TID PRN [name], [name], MD   50 mg at 02/19/22 2129   insulin aspart (novoLOG) injection 0-15 Units  0-15 Units Subcutaneous TID WC [name], [name], MD   2 Units at 02/17/22 1115   levothyroxine (SYNTHROID) tablet 88 mcg  88 mcg Oral Daily Charm Rings, NP   88 mcg at 02/20/22 0640   magnesium hydroxide (MILK OF MAGNESIA) suspension 30 mL  30 mL Oral Daily PRN Charm Rings, NP   30 mL at 02/18/22 0934   metFORMIN (GLUCOPHAGE) tablet 1,000 mg  1,000 mg Oral BID WC Charm Rings, NP   1,000 mg at 02/20/22 0835   ondansetron (ZOFRAN) tablet 4 mg  4 mg Oral Q6H PRN Sarina Ill, DO   4 mg at 02/06/22 1755   pioglitazone (ACTOS) tablet 45 mg  45 mg Oral Daily Charm Rings, NP   45 mg at 02/20/22 0835   simvastatin (ZOCOR) tablet 20 mg  20 mg Oral QHS Charm Rings, NP   20 mg at 02/19/22 2128   temazepam (RESTORIL) capsule 15 mg  15 mg Oral QHS PRN [name], [name], MD   15 mg at 02/19/22 2300   temazepam (RESTORIL) capsule 15 mg  15 mg Oral QHS [name], [name], MD   15 mg at 02/19/22 2129    Lab Results:  Results for orders placed or performed during the hospital encounter of 02/05/22 (from the past 48 hour(s))  Glucose, capillary     Status: None   Collection Time: 02/18/22  4:25 PM  Result Value Ref Range   Glucose-Capillary 99 70 - 99 mg/dL    Comment: Glucose reference range applies only to samples taken after fasting for at least 8 hours.  Glucose, capillary     Status: Abnormal   Collection Time: 02/19/22  6:49 AM  Result Value Ref Range   Glucose-Capillary 160 (H) 70 - 99 mg/dL    Comment: Glucose reference range applies only to samples taken after fasting for at least 8 hours.  Glucose, capillary     Status: Abnormal   Collection Time: 02/19/22  7:52 AM  Result Value Ref Range   Glucose-Capillary 119 (H) 70 - 99 mg/dL     Comment: Glucose reference range applies only to samples taken after  fasting for at least 8 hours.  Glucose, capillary     Status: Abnormal   Collection Time: 02/19/22 11:08 AM  Result Value Ref Range   Glucose-Capillary 109 (H) 70 - 99 mg/dL    Comment: Glucose reference range applies only to samples taken after fasting for at least 8 hours.  Glucose, capillary     Status: Abnormal   Collection Time: 02/19/22  8:29 PM  Result Value Ref Range   Glucose-Capillary 125 (H) 70 - 99 mg/dL    Comment: Glucose reference range applies only to samples taken after fasting for at least 8 hours.   Comment 1 Notify RN   Glucose, capillary     Status: None   Collection Time: 02/20/22  6:53 AM  Result Value Ref Range   Glucose-Capillary 97 70 - 99 mg/dL    Comment: Glucose reference range applies only to samples taken after fasting for at least 8 hours.   Comment 1 Notify RN   Glucose, capillary     Status: None   Collection Time: 02/20/22 12:09 PM  Result Value Ref Range   Glucose-Capillary 92 70 - 99 mg/dL    Comment: Glucose reference range applies only to samples taken after fasting for at least 8 hours.    Blood Alcohol level:  Lab Results  Component Value Date   ETH <10 02/02/2022   ETH <10 0000000    Metabolic Disorder Labs: Lab Results  Component Value Date   HGBA1C 7.0 (H) 02/08/2022   MPG 154.2 02/08/2022   MPG 116.89 05/13/2020   No results found for: "PROLACTIN" Lab Results  Component Value Date   CHOL 98 02/08/2022   TRIG 60 02/08/2022   HDL 44 02/08/2022   CHOLHDL 2.2 02/08/2022   VLDL 12 02/08/2022   LDLCALC 42 02/08/2022    Physical Findings: AIMS:  , ,  ,  ,    CIWA:    COWS:     Musculoskeletal: Strength & Muscle Tone: within normal limits Gait & Station: normal Patient leans: N/A  Psychiatric Specialty Exam:  Presentation  General Appearance: Disheveled  Eye Contact:None  Speech:Garbled; Pressured  Speech  Volume:Increased  Handedness:Right   Mood and Affect  Mood:Dysphoric; Irritable  Affect:Inappropriate; Full Range   Thought Process  Thought Processes:Disorganized  Descriptions of Associations:Loose  Orientation:Partial  Thought Content:Illogical; Tangential; Scattered; Delusions; Paranoid Ideation  History of Schizophrenia/Schizoaffective disorder:Yes  Duration of Psychotic Symptoms:Greater than six months  Hallucinations:No data recorded Ideas of Reference:Paranoia  Suicidal Thoughts:No data recorded Homicidal Thoughts:No data recorded  Sensorium  Memory:Immediate Poor; Recent Poor; Remote Poor  Judgment:Impaired  Insight:Lacking   Executive Functions  Concentration:Poor  Attention Span:Poor  Recall:Poor  Fund of Knowledge:Poor  Language:Poor   Psychomotor Activity  Psychomotor Activity:No data recorded  Assets  Assets:Financial Resources/Insurance; Physical Health; Resilience; Social Support   Sleep  Sleep:No data recorded   Physical Exam: Physical Exam ROS Blood pressure 133/72, pulse (!) 106, temperature 98.2 F (36.8 C), temperature source Oral, resp. rate 18, height 6\' 4"  (1.93 m), weight 109.3 kg, SpO2 100 %. Body mass index is 29.34 kg/m.   Treatment Plan Summary: Daily contact with patient to assess and evaluate symptoms and progress in treatment and Medication management  Patient is a 51 year old male with the above-stated past psychiatric history who is seen in follow-up.  Chart reviewed. Patient discussed with nursing. Patient is on a long-acting injectable, no change to that.     Plan:  -continue inpatient psych admission; 15-minute checks; daily contact  with patient to assess and evaluate symptoms and progress in treatment; psychoeducation.  -continue scheduled medications:  ARIPiprazole ER  400 mg Intramuscular Q28 days   aspirin EC  81 mg Oral Daily   benztropine  1 mg Oral Daily   fenofibrate  54 mg Oral Daily    gemfibrozil  600 mg Oral BID   insulin aspart  0-15 Units Subcutaneous TID WC   levothyroxine  88 mcg Oral Daily   metFORMIN  1,000 mg Oral BID WC   pioglitazone  45 mg Oral Daily   simvastatin  20 mg Oral QHS   temazepam  15 mg Oral QHS    -continue PRN medications.  acetaminophen, alum & mag hydroxide-simeth, clonazepam, haloperidol **OR** haloperidol lactate, hydrOXYzine, magnesium hydroxide, ondansetron, temazepam   -Disposition: possible discharge back to his previous environment after the weekend   Thalia Party, MD 02/20/2022, 12:53 PM

## 2022-02-21 DIAGNOSIS — F200 Paranoid schizophrenia: Secondary | ICD-10-CM | POA: Diagnosis not present

## 2022-02-21 LAB — GLUCOSE, CAPILLARY
Glucose-Capillary: 118 mg/dL — ABNORMAL HIGH (ref 70–99)
Glucose-Capillary: 108 mg/dL — ABNORMAL HIGH (ref 70–99)
Glucose-Capillary: 144 mg/dL — ABNORMAL HIGH (ref 70–99)

## 2022-02-21 NOTE — Progress Notes (Signed)
[facility] MD Progress Note  02/21/2022 8:25 AM Derrick Atkins  MRN:  [id number]  Principal Problem: Schizophrenia, paranoid type [facility]) Diagnosis: Principal Problem:   Schizophrenia, paranoid type ([facility])  Patient is a  51y.o. male with schizophrenia who presents to the [facility] unit due to worsened psychosis.    Interval History Patient was seen today for re-evaluation.  Nursing reports no events overnight. The patient has no issues with performing ADLs.  Patient has been medication compliant.    Subjective:  On assessment patient reports feeling very anxious this morning and upset due to not getting the breakfast he ordered, also due to not sleeping well last night. He is making multiple request to change his hospital clothes, socks, menu items. Says he is scared, although not able to provide any details. He took PRN medication for anxiety. He continues to perseverate that he is not wanting go to back to his previous group home. Reports feeling depressed, denies active suicidal/homicidal ideations. Denies auditory/visual hallucinations. The patient reports no side effects from medications.     Labs: no new results for review.      Total Time spent with patient: 30 minutes  Past Psychiatric History: Schizophrenia  Past Medical History:  Past Medical History:  Diagnosis Date   Cerebral palsy ([facility])    Depression    Diabetes mellitus without complication ([facility])    Hypertension    Kidney stones    Schizoaffective disorder ([facility])    Scoliosis     Past Surgical History:  Procedure Laterality Date   BOWEL RESECTION     LAPAROTOMY N/A 05/15/2020   Procedure: EXPLORATORY LAPAROTOMY;  Surgeon: Henrene Dodge, MD;  Location: [facility] ORS;  Service: General;  Laterality: N/A;   Family History:  Family History  Problem Relation Age of Onset   Heart disease Father    Heart attack Father    Family Psychiatric  History:  Social History:  Social History   Substance and Sexual Activity  Alcohol Use  No     Social History   Substance and Sexual Activity  Drug Use No    Social History   Socioeconomic History   Marital status: Single    Spouse name: Not on file   Number of children: Not on file   Years of education: Not on file   Highest education level: Not on file  Occupational History   Not on file  Tobacco Use   Smoking status: Never   Smokeless tobacco: Never  Vaping Use   Vaping Use: Never used  Substance and Sexual Activity   Alcohol use: No   Drug use: No   Sexual activity: Not Currently  Other Topics Concern   Not on file  Social History Narrative   Not on file   Social Determinants of Health   Financial Resource Strain: Not on file  Food Insecurity: Not on file  Transportation Needs: Not on file  Physical Activity: Not on file  Stress: Not on file  Social Connections: Not on file   Additional Social History:                         Sleep: Good  Appetite:  Good  Current Medications: Current Facility-Administered Medications  Medication Dose Route Frequency Provider Last Rate Last Admin   acetaminophen (TYLENOL) tablet 650 mg  650 mg Oral Q6H PRN Charm Rings, NP   650 mg at 02/21/22 0811   alum & mag hydroxide-simeth (MAALOX/MYLANTA) 200-200-20 MG/5ML  suspension 30 mL  30 mL Oral Q4H PRN Charm Rings, NP       ARIPiprazole ER (ABILIFY MAINTENA) injection 400 mg  400 mg Intramuscular Q28 days [name], [name], MD   400 mg at 02/18/22 1416   aspirin EC tablet 81 mg  81 mg Oral Daily Charm Rings, NP   81 mg at 02/21/22 7829   benztropine (COGENTIN) tablet 1 mg  1 mg Oral Daily [name], [name], MD   1 mg at 02/21/22 5621   clonazePAM (KLONOPIN) disintegrating tablet 0.5 mg  0.5 mg Oral BID PRN [name], [name], MD   0.5 mg at 02/20/22 2114   fenofibrate tablet 54 mg  54 mg Oral Daily Charm Rings, NP   54 mg at 02/21/22 3086   gemfibrozil (LOPID) tablet 600 mg  600 mg Oral BID Charm Rings, NP   600 mg at 02/21/22 5784    haloperidol (HALDOL) tablet 5 mg  5 mg Oral Q6H PRN [name], [name], MD   5 mg at 02/17/22 2124   Or   haloperidol lactate (HALDOL) injection 5 mg  5 mg Intramuscular Q6H PRN [name], [name], MD   5 mg at 02/19/22 1712   hydrOXYzine (ATARAX) tablet 50 mg  50 mg Oral TID PRN [name], [name], MD   50 mg at 02/21/22 0811   insulin aspart (novoLOG) injection 0-15 Units  0-15 Units Subcutaneous TID WC [name], [name], MD   2 Units at 02/17/22 1115   levothyroxine (SYNTHROID) tablet 88 mcg  88 mcg Oral Daily Charm Rings, NP   88 mcg at 02/21/22 6962   magnesium hydroxide (MILK OF MAGNESIA) suspension 30 mL  30 mL Oral Daily PRN Charm Rings, NP   30 mL at 02/21/22 0811   metFORMIN (GLUCOPHAGE) tablet 1,000 mg  1,000 mg Oral BID WC Charm Rings, NP   1,000 mg at 02/21/22 0811   ondansetron (ZOFRAN) tablet 4 mg  4 mg Oral Q6H PRN Sarina Ill, DO   4 mg at 02/06/22 1755   pioglitazone (ACTOS) tablet 45 mg  45 mg Oral Daily Charm Rings, NP   45 mg at 02/21/22 9528   simvastatin (ZOCOR) tablet 20 mg  20 mg Oral QHS Charm Rings, NP   20 mg at 02/20/22 2113   temazepam (RESTORIL) capsule 15 mg  15 mg Oral QHS PRN [name], [name], MD   15 mg at 02/19/22 2300   temazepam (RESTORIL) capsule 15 mg  15 mg Oral QHS [name], [name], MD   15 mg at 02/20/22 2114    Lab Results:  Results for orders placed or performed during the hospital encounter of 02/05/22 (from the past 48 hour(s))  Glucose, capillary     Status: Abnormal   Collection Time: 02/19/22 11:08 AM  Result Value Ref Range   Glucose-Capillary 109 (H) 70 - 99 mg/dL    Comment: Glucose reference range applies only to samples taken after fasting for at least 8 hours.  Glucose, capillary     Status: Abnormal   Collection Time: 02/19/22  8:29 PM  Result Value Ref Range   Glucose-Capillary 125 (H) 70 - 99 mg/dL    Comment: Glucose reference range applies only to samples taken after fasting for at least 8 hours.   Comment 1  Notify RN   Glucose, capillary     Status: None   Collection Time: 02/20/22  6:53 AM  Result Value Ref Range   Glucose-Capillary 97 70 - 99 mg/dL    Comment: Glucose reference range applies only to samples taken after fasting for at least 8 hours.   Comment 1 Notify RN   Glucose, capillary     Status: None   Collection Time: 02/20/22 12:09 PM  Result Value Ref Range   Glucose-Capillary 92 70 - 99 mg/dL    Comment: Glucose reference range applies only to samples taken after fasting for at least 8 hours.  Glucose, capillary     Status: Abnormal   Collection Time: 02/20/22  4:17 PM  Result Value Ref Range   Glucose-Capillary 111 (H) 70 - 99 mg/dL    Comment: Glucose reference range applies only to samples taken after fasting for at least 8 hours.  Glucose, capillary     Status: Abnormal   Collection Time: 02/20/22  8:37 PM  Result Value Ref Range   Glucose-Capillary 105 (H) 70 - 99 mg/dL    Comment: Glucose reference range applies only to samples taken after fasting for at least 8 hours.  Glucose, capillary     Status: Abnormal   Collection Time: 02/21/22  6:50 AM  Result Value Ref Range   Glucose-Capillary 118 (H) 70 - 99 mg/dL    Comment: Glucose reference range applies only to samples taken after fasting for at least 8 hours.   Comment 1 Notify RN     Blood Alcohol level:  Lab Results  Component Value Date   ETH <10 02/02/2022   ETH <10 01/18/2022    Metabolic Disorder Labs: Lab Results  Component Value Date   HGBA1C 7.0 (H) 02/08/2022   MPG 154.2 02/08/2022   MPG 116.89 05/13/2020   No results found for: "PROLACTIN" Lab Results  Component Value Date   CHOL 98 02/08/2022   TRIG 60 02/08/2022   HDL 44 02/08/2022   CHOLHDL 2.2 02/08/2022   VLDL 12 02/08/2022   LDLCALC 42 02/08/2022    Physical Findings: AIMS:  , ,  ,  ,    CIWA:    COWS:     Musculoskeletal: Strength & Muscle Tone: within normal limits Gait & Station: normal Patient leans:  N/A  Psychiatric Specialty Exam:  Presentation  General Appearance: Disheveled  Eye Contact:None  Speech:Garbled; Pressured  Speech Volume:Increased  Handedness:Right   Mood and Affect  Mood:Dysphoric; Irritable  Affect:Inappropriate; Full Range   Thought Process  Thought Processes:Disorganized  Descriptions of Associations:Loose  Orientation:Partial  Thought Content:Illogical; Tangential; Scattered; Delusions; Paranoid Ideation  History of Schizophrenia/Schizoaffective disorder:Yes  Duration of Psychotic Symptoms:Greater than six months  Hallucinations:No data recorded Ideas of Reference:Paranoia  Suicidal Thoughts:No data recorded Homicidal Thoughts:No data recorded  Sensorium  Memory:Immediate Poor; Recent Poor; Remote Poor  Judgment:Impaired  Insight:Lacking   Executive Functions  Concentration:Poor  Attention Span:Poor  Recall:Poor  Fund of Knowledge:Poor  Language:Poor   Psychomotor Activity  Psychomotor Activity:No data recorded  Assets  Assets:Financial Resources/Insurance; Physical Health; Resilience; Social Support   Sleep  Sleep:No data recorded   Physical Exam: Physical Exam ROS Blood pressure (!) 144/79, pulse (!) 108, temperature 98 F (36.7 C), temperature source Oral, resp. rate 18, height 6\' 4"  (1.93 m), weight 109.3 kg, SpO2 100 %. Body mass index is 29.34 kg/m.   Treatment Plan Summary: Daily contact with patient to assess and evaluate symptoms and progress in treatment and Medication management  Patient is a 51 year old male with the above-stated past psychiatric history who is seen in follow-up.  Chart reviewed.  Patient discussed with nursing. Patient is on a long-acting injectable, no change to that.     Plan:  -continue inpatient psych admission; 15-minute checks; daily contact with patient to assess and evaluate symptoms and progress in treatment; psychoeducation.  -continue scheduled medications:   ARIPiprazole ER  400 mg Intramuscular Q28 days   aspirin EC  81 mg Oral Daily   benztropine  1 mg Oral Daily   fenofibrate  54 mg Oral Daily   gemfibrozil  600 mg Oral BID   insulin aspart  0-15 Units Subcutaneous TID WC   levothyroxine  88 mcg Oral Daily   metFORMIN  1,000 mg Oral BID WC   pioglitazone  45 mg Oral Daily   simvastatin  20 mg Oral QHS   temazepam  15 mg Oral QHS    -continue PRN medications.  acetaminophen, alum & mag hydroxide-simeth, clonazepam, haloperidol **OR** haloperidol lactate, hydrOXYzine, magnesium hydroxide, ondansetron, temazepam   -Disposition: possible discharge back to his previous environment after the weekend   Thalia Party, MD 02/21/2022, 8:25 AM

## 2022-02-21 NOTE — Progress Notes (Signed)
Patient alert and oriented x 4, affect is blunted thoughts are disorganized, he appears restless and irritable not interacting appropriately with peers noted fixated on a male patient. Patient was frequently redirected and he became agitated slamming doors and upset. Patient was complaint with medication, 15 minutes safety checks maintained will continue to closely monitor.

## 2022-02-21 NOTE — Plan of Care (Signed)
D: Pt alert and oriented. Pt reports experiencing anxiety/depression at this time. Pt reports experiencing 10/10 chronic back pain at this time, prn medication given and found helpful. Pt denies experiencing any SI/HI however, endorses experiencing AVH at this time. Pt gives no details as to what he is hearing or seeing at this time.  Pt took a shower and changed the linens on his bed today. Pt also had his nails filed and stated that this all made him feel better. Pt has not been staring into the nurse's station intensely for prolonged periods of time. Pt also had not become fixated on individuals today, not has he been following them around the unit or going into other pt's room.  A: Scheduled medications administered to pt, per MD orders. Support and encouragement provided. Frequent verbal contact made. Routine safety checks conducted q15 minutes.   R: No adverse drug reactions noted. Pt verbally contracts for safety at this time. Pt compliant with medications and treatment plan. Pt interacts poorly with others on the unit. Pt remains safe at this time. Plan of care ongoing.   Problem: Nutrition: Goal: Adequate nutrition will be maintained Outcome: Progressing   Problem: Coping: Goal: Level of anxiety will decrease Outcome: Not Progressing

## 2022-02-22 DIAGNOSIS — F200 Paranoid schizophrenia: Secondary | ICD-10-CM | POA: Diagnosis not present

## 2022-02-22 LAB — GLUCOSE, CAPILLARY
Glucose-Capillary: 176 mg/dL — ABNORMAL HIGH (ref 70–99)
Glucose-Capillary: 125 mg/dL — ABNORMAL HIGH (ref 70–99)
Glucose-Capillary: 139 mg/dL — ABNORMAL HIGH (ref 70–99)
Glucose-Capillary: 105 mg/dL — ABNORMAL HIGH (ref 70–99)

## 2022-02-22 MED ORDER — TRIPLE ANTIBIOTIC 3.5-400-5000 EX OINT
TOPICAL_OINTMENT | Freq: Two times a day (BID) | CUTANEOUS | Status: DC
  Filled 2022-02-22 (×5): qty 1

## 2022-02-22 NOTE — Group Note (Signed)
[facility] LCSW Group Therapy Note    Group Date: 02/22/2022 Start Time: 1300 End Time: 1400  Type of Therapy and Topic:  Group Therapy:  Overcoming Obstacles  Participation Level:  [facility] PARTICIPATION LEVEL: Active   Description of Group:   In this group patients will be encouraged to explore what they see as obstacles to their own wellness and recovery. They will be guided to discuss their thoughts, feelings, and behaviors related to these obstacles. The group will process together ways to cope with barriers, with attention given to specific choices patients can make. Each patient will be challenged to identify changes they are motivated to make in order to overcome their obstacles. This group will be process-oriented, with patients participating in exploration of their own experiences as well as giving and receiving support and challenge from other group members.  Therapeutic Goals: 1. Patient will identify personal and current obstacles as they relate to admission. 2. Patient will identify barriers that currently interfere with their wellness or overcoming obstacles.  3. Patient will identify feelings, thought process and behaviors related to these barriers. 4. Patient will identify two changes they are willing to make to overcome these obstacles:    Summary of Patient Progress Patient was present for the entirety of the group discussion. He stated that he wants to go home. Pt spoke about needing to go somewhere where they will take good care of him. He talked about his ACTT services through [employer]. Pt also stated that his brother is a support although, he must care for his family and cannot focus on pt. Pt did give some constructive feedback to peer in group. He appeared open and receptive to feedback/comments from the facilitator.    Therapeutic Modalities:   Cognitive Behavioral Therapy Solution Focused Therapy Motivational  Interviewing Relapse Prevention Therapy   Glenis Smoker, LCSW

## 2022-02-22 NOTE — Progress Notes (Signed)
Recreation Therapy Notes   Date: 02/22/2022   Time: 10:45 am     Location: Courtyard  Behavioral response: Appropriate  Intervention Topic:  Wellness   Discussion/Intervention:  Group content today was focused on Wellness. The group defined wellness and some positive ways they make decisions for themselves. Individuals expressed reasons why they neglected any wellness in the past. Patients described ways to improve wellness skills in the future. The group explained what could happen if they did not do any wellness at all. Participants express how bad choices has affected them and others around them. Individual explained the importance of wellness. The group participated in the intervention "Testing my Wellness" where they had a chance to identify some of their weaknesses and strengths in wellness.  Clinical Observations/Feedback: Patient came to group and was appropriate while focusing on group content. Individual was social with peers and staff while participating in the intervention.  Torsten Weniger LRT/CTRS        Rand Boller 02/22/2022 12:14 PM

## 2022-02-22 NOTE — Progress Notes (Signed)
Patient alert and oriented x 4 no distress noted, he denies SI/HI/AVH will continue to monitor.

## 2022-02-22 NOTE — Plan of Care (Signed)
D: Pt alert and oriented. Pt rates depression 9/10, hopelessness 0/10, and anxiety 5/10. Pt goal: Trying to get along with my staff and take meds when told to. Pt reports energy level as high and concentration as being poor. Pt reports sleep last night as being good. Pt did receive medications for sleep and did find them helpful. Pt denies experiencing any pain at this time. Pt denies experiencing any SI/HI, or AVH at this time.   Pt initiated and carried a conversation this morning with this Clinical research associate. Pt spoke about how his father worked at [employer] in [employer] as a Investment banker, operational. Pt spoke about father's passing. Pt then requested if I could speak the the MD about getting him some neosporin for the abrasions on the knuckle of his Left hand and his toe. Pt voiced concern for being diabetic and needing to provide care for his feet. This lead the pt to ask about possible being able to have his shoes for going outside and how he didn't want anything to happen to his feet do to being diabetic. This writer reassured his that I would speak with the MD about getting an order for neosporin. MD was notified and order was placed.  Pt went outside for recreational therapy and attended social work group this afternoon.    A: Scheduled medications administered to pt, per MD orders. Support and encouragement provided. Frequent verbal contact made. Routine safety checks conducted q15 minutes.   R: No adverse drug reactions noted. Pt verbally contracts for safety at this time. Pt compliant with medications and treatment plan. Pt interacts minimally with others on the unit. Pt remains safe at this time. Plan of care ongoing.   Problem: Activity: Goal: Risk for activity intolerance will decrease Outcome: Progressing   Problem: Nutrition: Goal: Adequate nutrition will be maintained Outcome: Progressing

## 2022-02-22 NOTE — Progress Notes (Signed)
[facility] MD Progress Note  02/22/2022 3:28 PM Derrick Atkins  MRN:  [id number] Subjective: Follow-up patient with schizophrenia.  Appears to be stabilizing.  As he tells me himself he has been able to express himself more clearly and think more clearly for the last few days.  He is able to hold a pretty reasonable conversation.  Denies hallucinations.  Does not want to be placed anywhere right now Principal Problem: Schizophrenia, paranoid type (HCC) Diagnosis: Principal Problem:   Schizophrenia, paranoid type (HCC)  Total Time spent with patient: 30 minutes  Past Psychiatric History: Past history of schizophrenia  Past Medical History:  Past Medical History:  Diagnosis Date   Cerebral palsy (HCC)    Depression    Diabetes mellitus without complication (HCC)    Hypertension    Kidney stones    Schizoaffective disorder (HCC)    Scoliosis     Past Surgical History:  Procedure Laterality Date   BOWEL RESECTION     LAPAROTOMY N/A 05/15/2020   Procedure: EXPLORATORY LAPAROTOMY;  Surgeon: Henrene Dodge, MD;  Location: [facility] ORS;  Service: General;  Laterality: N/A;   Family History:  Family History  Problem Relation Age of Onset   Heart disease Father    Heart attack Father    Family Psychiatric  History: See previous Social History:  Social History   Substance and Sexual Activity  Alcohol Use No     Social History   Substance and Sexual Activity  Drug Use No    Social History   Socioeconomic History   Marital status: Single    Spouse name: Not on file   Number of children: Not on file   Years of education: Not on file   Highest education level: Not on file  Occupational History   Not on file  Tobacco Use   Smoking status: Never   Smokeless tobacco: Never  Vaping Use   Vaping Use: Never used  Substance and Sexual Activity   Alcohol use: No   Drug use: No   Sexual activity: Not Currently  Other Topics Concern   Not on file  Social History Narrative   Not on  file   Social Determinants of Health   Financial Resource Strain: Not on file  Food Insecurity: Not on file  Transportation Needs: Not on file  Physical Activity: Not on file  Stress: Not on file  Social Connections: Not on file   Additional Social History:                         Sleep: Fair  Appetite:  Fair  Current Medications: Current Facility-Administered Medications  Medication Dose Route Frequency Provider Last Rate Last Admin   acetaminophen (TYLENOL) tablet 650 mg  650 mg Oral Q6H PRN Charm Rings, NP   650 mg at 02/21/22 0811   alum & mag hydroxide-simeth (MAALOX/MYLANTA) 200-200-20 MG/5ML suspension 30 mL  30 mL Oral Q4H PRN Charm Rings, NP       ARIPiprazole ER (ABILIFY MAINTENA) injection 400 mg  400 mg Intramuscular Q28 days Mckenzy Salazar T, MD   400 mg at 02/18/22 1416   aspirin EC tablet 81 mg  81 mg Oral Daily Charm Rings, NP   81 mg at 02/22/22 0725   benztropine (COGENTIN) tablet 1 mg  1 mg Oral Daily Christalynn Boise T, MD   1 mg at 02/22/22 0725   clonazePAM (KLONOPIN) disintegrating tablet 0.5 mg  0.5 mg Oral  BID PRN Cree Napoli, [name], MD   0.5 mg at 02/20/22 2114   fenofibrate tablet 54 mg  54 mg Oral Daily Charm Rings, NP   54 mg at 02/22/22 0725   gemfibrozil (LOPID) tablet 600 mg  600 mg Oral BID Charm Rings, NP   600 mg at 02/22/22 0725   haloperidol (HALDOL) tablet 5 mg  5 mg Oral Q6H PRN Macy Polio, [name], MD   5 mg at 02/17/22 2124   Or   haloperidol lactate (HALDOL) injection 5 mg  5 mg Intramuscular Q6H PRN Lua Feng, [name], MD   5 mg at 02/19/22 1712   hydrOXYzine (ATARAX) tablet 50 mg  50 mg Oral TID PRN Veneda Kirksey, [name], MD   50 mg at 02/21/22 0811   insulin aspart (novoLOG) injection 0-15 Units  0-15 Units Subcutaneous TID WC Kamorie Aldous, [name], MD   2 Units at 02/22/22 1129   levothyroxine (SYNTHROID) tablet 88 mcg  88 mcg Oral Daily Charm Rings, NP   88 mcg at 02/22/22 1194   magnesium hydroxide (MILK OF MAGNESIA)  suspension 30 mL  30 mL Oral Daily PRN Charm Rings, NP   30 mL at 02/21/22 0811   metFORMIN (GLUCOPHAGE) tablet 1,000 mg  1,000 mg Oral BID WC Charm Rings, NP   1,000 mg at 02/22/22 0725   neomycin-bacitracin-polymyxin (NEOSPORIN) ointment   Topical BID Isamu Trammel, [name], MD       ondansetron [facility]) tablet 4 mg  4 mg Oral Q6H PRN Sarina Ill, DO   4 mg at 02/06/22 1755   pioglitazone (ACTOS) tablet 45 mg  45 mg Oral Daily Charm Rings, NP   45 mg at 02/22/22 0725   simvastatin (ZOCOR) tablet 20 mg  20 mg Oral QHS Charm Rings, NP   20 mg at 02/21/22 2116   temazepam (RESTORIL) capsule 15 mg  15 mg Oral QHS PRN Pocahontas Cohenour, [name], MD   15 mg at 02/19/22 2300   temazepam (RESTORIL) capsule 15 mg  15 mg Oral QHS Malasia Torain, [name], MD   15 mg at 02/21/22 2116    Lab Results:  Results for orders placed or performed during the hospital encounter of 02/05/22 (from the past 48 hour(s))  Glucose, capillary     Status: Abnormal   Collection Time: 02/20/22  4:17 PM  Result Value Ref Range   Glucose-Capillary 111 (H) 70 - 99 mg/dL    Comment: Glucose reference range applies only to samples taken after fasting for at least 8 hours.  Glucose, capillary     Status: Abnormal   Collection Time: 02/20/22  8:37 PM  Result Value Ref Range   Glucose-Capillary 105 (H) 70 - 99 mg/dL    Comment: Glucose reference range applies only to samples taken after fasting for at least 8 hours.  Glucose, capillary     Status: Abnormal   Collection Time: 02/21/22  6:50 AM  Result Value Ref Range   Glucose-Capillary 118 (H) 70 - 99 mg/dL    Comment: Glucose reference range applies only to samples taken after fasting for at least 8 hours.   Comment 1 Notify RN   Glucose, capillary     Status: Abnormal   Collection Time: 02/21/22 11:27 AM  Result Value Ref Range   Glucose-Capillary 108 (H) 70 - 99 mg/dL    Comment: Glucose reference range applies only to samples taken after fasting for at least 8  hours.  Glucose,  capillary     Status: Abnormal   Collection Time: 02/21/22  4:18 PM  Result Value Ref Range   Glucose-Capillary 144 (H) 70 - 99 mg/dL    Comment: Glucose reference range applies only to samples taken after fasting for at least 8 hours.  Glucose, capillary     Status: Abnormal   Collection Time: 02/22/22  7:11 AM  Result Value Ref Range   Glucose-Capillary 176 (H) 70 - 99 mg/dL    Comment: Glucose reference range applies only to samples taken after fasting for at least 8 hours.  Glucose, capillary     Status: Abnormal   Collection Time: 02/22/22 11:22 AM  Result Value Ref Range   Glucose-Capillary 125 (H) 70 - 99 mg/dL    Comment: Glucose reference range applies only to samples taken after fasting for at least 8 hours.    Blood Alcohol level:  Lab Results  Component Value Date   ETH <10 02/02/2022   ETH <10 01/18/2022    Metabolic Disorder Labs: Lab Results  Component Value Date   HGBA1C 7.0 (H) 02/08/2022   MPG 154.2 02/08/2022   MPG 116.89 05/13/2020   No results found for: "PROLACTIN" Lab Results  Component Value Date   CHOL 98 02/08/2022   TRIG 60 02/08/2022   HDL 44 02/08/2022   CHOLHDL 2.2 02/08/2022   VLDL 12 02/08/2022   LDLCALC 42 02/08/2022    Physical Findings: AIMS:  , ,  ,  ,    CIWA:    COWS:     Musculoskeletal: Strength & Muscle Tone: within normal limits Gait & Station: normal Patient leans: N/A  Psychiatric Specialty Exam:  Presentation  General Appearance: Disheveled  Eye Contact:None  Speech:Garbled; Pressured  Speech Volume:Increased  Handedness:Right   Mood and Affect  Mood:Dysphoric; Irritable  Affect:Inappropriate; Full Range   Thought Process  Thought Processes:Disorganized  Descriptions of Associations:Loose  Orientation:Partial  Thought Content:Illogical; Tangential; Scattered; Delusions; Paranoid Ideation  History of Schizophrenia/Schizoaffective disorder:Yes  Duration of Psychotic  Symptoms:Greater than six months  Hallucinations:No data recorded Ideas of Reference:Paranoia  Suicidal Thoughts:No data recorded Homicidal Thoughts:No data recorded  Sensorium  Memory:Immediate Poor; Recent Poor; Remote Poor  Judgment:Impaired  Insight:Lacking   Executive Functions  Concentration:Poor  Attention Span:Poor  Recall:Poor  Fund of Knowledge:Poor  Language:Poor   Psychomotor Activity  Psychomotor Activity:No data recorded  Assets  Assets:Financial Resources/Insurance; Physical Health; Resilience; Social Support   Sleep  Sleep:No data recorded   Physical Exam: Physical Exam Vitals and nursing note reviewed.  Constitutional:      Appearance: Normal appearance.  HENT:     Head: Normocephalic and atraumatic.     Mouth/Throat:     Pharynx: Oropharynx is clear.  Eyes:     Pupils: Pupils are equal, round, and reactive to light.  Cardiovascular:     Rate and Rhythm: Normal rate and regular rhythm.  Pulmonary:     Effort: Pulmonary effort is normal.     Breath sounds: Normal breath sounds.  Abdominal:     General: Abdomen is flat.     Palpations: Abdomen is soft.  Musculoskeletal:        General: Normal range of motion.  Skin:    General: Skin is warm and dry.  Neurological:     General: No focal deficit present.     Mental Status: He is alert. Mental status is at baseline.  Psychiatric:        Attention and Perception: Attention normal.  Mood and Affect: Mood normal. Affect is blunt.        Speech: Speech is delayed.        Behavior: Behavior normal.        Thought Content: Thought content normal.        Cognition and Memory: Cognition normal.        Judgment: Judgment is impulsive.    Review of Systems  Constitutional: Negative.   HENT: Negative.    Eyes: Negative.   Respiratory: Negative.    Cardiovascular: Negative.   Gastrointestinal: Negative.   Musculoskeletal: Negative.   Skin: Negative.   Neurological: Negative.    Psychiatric/Behavioral: Negative.     Blood pressure 133/75, pulse 88, temperature 98.1 F (36.7 C), temperature source Oral, resp. rate 18, height 6\' 4"  (1.93 m), weight 109.3 kg, SpO2 100 %. Body mass index is 29.34 kg/m.   Treatment Plan Summary: Plan no change to medication except to add the Neosporin he is requesting.  We will need to contact his guardian but I can hope that we may be able to get him discharged in the next couple days as he seems to be much more stable  , MD 02/22/2022, 3:28 PM

## 2022-02-22 NOTE — Progress Notes (Signed)
Pt presents with a flat affect, denies SI/HI/AVH. Pt endorses anxiety and depression. Pt observed interacting appropriately with staff and peers on the unit. Pt compliant with medication administration per MD orders. Pt given education, support, and encouragement to be active in his treatment plan. Pt being monitored Q 15 minutes for safety per unit protocol, remains safe on the unit

## 2022-02-23 DIAGNOSIS — F200 Paranoid schizophrenia: Secondary | ICD-10-CM | POA: Diagnosis not present

## 2022-02-23 LAB — GLUCOSE, CAPILLARY
Glucose-Capillary: 105 mg/dL — ABNORMAL HIGH (ref 70–99)
Glucose-Capillary: 125 mg/dL — ABNORMAL HIGH (ref 70–99)
Glucose-Capillary: 136 mg/dL — ABNORMAL HIGH (ref 70–99)
Glucose-Capillary: 220 mg/dL — ABNORMAL HIGH (ref 70–99)

## 2022-02-23 MED ORDER — ZIPRASIDONE MESYLATE 20 MG IM SOLR
20.0000 mg | Freq: Two times a day (BID) | INTRAMUSCULAR | Status: DC | PRN
  Administered 2022-02-24: 20 mg via INTRAMUSCULAR
  Filled 2022-02-23: qty 20

## 2022-02-23 NOTE — Progress Notes (Signed)
Recreation Therapy Notes  Date: 02/23/2022   Time: 10:25 am     Location: Craft room    Behavioral response: N/A   Intervention Topic: Time Management   Discussion/Intervention: Patient refused to attend group.    Clinical Observations/Feedback:  Patient refused to attend group.    Dayna Alia LRT/CTRS        Keawe Marcello 02/23/2022 11:23 AM

## 2022-02-23 NOTE — Progress Notes (Signed)
Pt presents with a flat affect, denies SI/HI/AVH. Pt endorses anxiety and depression. Pt observed interacting appropriately with staff and peers on the unit. Pt compliant with medication administration per MD orders. Pt given education, support, and encouragement to be active in his treatment plan. Pt being monitored Q 15 minutes for safety per unit protocol, remains safe on the unit 

## 2022-02-23 NOTE — Progress Notes (Signed)
Pt refused evening neosporin stating he didn't need any at this time. MD notified and aware.

## 2022-02-23 NOTE — BH IP Treatment Plan (Signed)
Interdisciplinary Treatment and Diagnostic Plan Update  02/23/2022 Time of Session: 8:30AM Derrick Atkins MRN: [id number]  Principal Diagnosis: Schizophrenia, paranoid type [facility])  Secondary Diagnoses: Principal Problem:   Schizophrenia, paranoid type (HCC)   Current Medications:  Current Facility-Administered Medications  Medication Dose Route Frequency Provider Last Rate Last Admin   acetaminophen (TYLENOL) tablet 650 mg  650 mg Oral Q6H PRN Charm Rings, NP   650 mg at 02/21/22 0811   alum & mag hydroxide-simeth (MAALOX/MYLANTA) 200-200-20 MG/5ML suspension 30 mL  30 mL Oral Q4H PRN Charm Rings, NP       ARIPiprazole ER (ABILIFY MAINTENA) injection 400 mg  400 mg Intramuscular Q28 days [name], [name], MD   400 mg at 02/18/22 1416   aspirin EC tablet 81 mg  81 mg Oral Daily Charm Rings, NP   81 mg at 02/23/22 7893   benztropine (COGENTIN) tablet 1 mg  1 mg Oral Daily [name], [name], MD   1 mg at 02/23/22 8101   clonazePAM (KLONOPIN) disintegrating tablet 0.5 mg  0.5 mg Oral BID PRN [name], [name], MD   0.5 mg at 02/20/22 2114   fenofibrate tablet 54 mg  54 mg Oral Daily Charm Rings, NP   54 mg at 02/23/22 0734   gemfibrozil (LOPID) tablet 600 mg  600 mg Oral BID Charm Rings, NP   600 mg at 02/23/22 0734   haloperidol (HALDOL) tablet 5 mg  5 mg Oral Q6H PRN [name], [name], MD   5 mg at 02/17/22 2124   Or   haloperidol lactate (HALDOL) injection 5 mg  5 mg Intramuscular Q6H PRN [name], [name], MD   5 mg at 02/19/22 1712   hydrOXYzine (ATARAX) tablet 50 mg  50 mg Oral TID PRN [name], [name], MD   50 mg at 02/21/22 0811   insulin aspart (novoLOG) injection 0-15 Units  0-15 Units Subcutaneous TID WC [name], [name], MD   2 Units at 02/22/22 1624   levothyroxine (SYNTHROID) tablet 88 mcg  88 mcg Oral Daily Charm Rings, NP   88 mcg at 02/23/22 0647   magnesium hydroxide (MILK OF MAGNESIA) suspension 30 mL  30 mL Oral Daily PRN Charm Rings, NP   30 mL at  02/21/22 0811   metFORMIN (GLUCOPHAGE) tablet 1,000 mg  1,000 mg Oral BID WC Charm Rings, NP   1,000 mg at 02/23/22 7510   neomycin-bacitracin-polymyxin [phone] OINT   Topical BID [name], [name], MD   Given at 02/23/22 0734   ondansetron (ZOFRAN) tablet 4 mg  4 mg Oral Q6H PRN Sarina Ill, DO   4 mg at 02/06/22 1755   pioglitazone (ACTOS) tablet 45 mg  45 mg Oral Daily Charm Rings, NP   45 mg at 02/23/22 0734   simvastatin (ZOCOR) tablet 20 mg  20 mg Oral QHS Charm Rings, NP   20 mg at 02/22/22 2115   temazepam (RESTORIL) capsule 15 mg  15 mg Oral QHS PRN [name], [name], MD   15 mg at 02/19/22 2300   temazepam (RESTORIL) capsule 15 mg  15 mg Oral QHS [name], [name], MD   15 mg at 02/22/22 2115   PTA Medications: Medications Prior to Admission  Medication Sig Dispense Refill Last Dose   ARIPiprazole (ABILIFY) 10 MG tablet Take 10 mg by mouth daily.      ARIPiprazole (ABILIFY) 20 MG tablet Take 1 tablet by mouth  daily.  (Patient not taking: Reported on 02/02/2022)      aspirin 81 MG EC tablet Take 1 tablet by mouth daily.      buPROPion (WELLBUTRIN XL) 300 MG 24 hr tablet Take 300 mg by mouth daily. (Patient not taking: Reported on 02/02/2022)      buPROPion (WELLBUTRIN) 75 MG tablet Take 75 mg by mouth 2 (two) times daily.      cyclobenzaprine (FLEXERIL) 5 MG tablet Take 1 tablet (5 mg total) by mouth at bedtime. (Patient not taking: Reported on 01/18/2022) 30 tablet 0    divalproex (DEPAKOTE ER) 500 MG 24 hr tablet Take 1,000 mg by mouth at bedtime. (Patient not taking: Reported on 02/02/2022)      divalproex (DEPAKOTE) 500 MG DR tablet Take 500 mg by mouth 2 (two) times daily.      gemfibrozil (LOPID) 600 MG tablet Take 600 mg by mouth 2 (two) times daily.      Insulin Lispro Prot & Lispro (HUMALOG 75/25 MIX) (75-25) 100 UNIT/ML Kwikpen Inject into the skin.      levothyroxine (SYNTHROID, LEVOTHROID) 88 MCG tablet Take 88 mcg by mouth daily.      lidocaine  (LIDODERM) 5 % Place 1 patch onto the skin every 12 (twelve) hours. Remove & Discard patch within 12 hours or as directed by MD (Patient not taking: Reported on 01/18/2022) 15 patch 0    lisinopril (PRINIVIL,ZESTRIL) 10 MG tablet Take 1 tablet by mouth daily.      metFORMIN (GLUCOPHAGE) 1000 MG tablet Take 1,000 mg by mouth 2 (two) times daily with a meal.      metoprolol succinate (TOPROL-XL) 50 MG 24 hr tablet Take 50 mg by mouth daily. (Patient not taking: Reported on 02/02/2022)      naproxen (NAPROSYN) 500 MG tablet Take 1 tablet (500 mg total) by mouth 2 (two) times daily with a meal. (Patient not taking: Reported on 01/18/2022) 20 tablet 0    NOVOLOG MIX 70/30 FLEXPEN (70-30) 100 UNIT/ML FlexPen Inject 34-44 Units into the skin 2 (two) times daily. 34 units-am 44units-pm      pioglitazone (ACTOS) 45 MG tablet Take 45 mg by mouth daily.      polyethylene glycol (MIRALAX / GLYCOLAX) 17 g packet Take 17 g by mouth daily. (Patient not taking: Reported on 08/30/2021) 14 each 0    simvastatin (ZOCOR) 20 MG tablet Take 20 mg by mouth at bedtime.       Patient Stressors: Financial difficulties   Health problems    Patient Strengths: Motivation for treatment/growth   Treatment Modalities: Medication Management, Group therapy, Case management,  1 to 1 session with clinician, Psychoeducation, Recreational therapy.   Physician Treatment Plan for Primary Diagnosis: Schizophrenia, paranoid type (HCC) Long Term Goal(s): Improvement in symptoms so as ready for discharge   Short Term Goals: Ability to identify changes in lifestyle to reduce recurrence of condition will improve Ability to verbalize feelings will improve Ability to disclose and discuss suicidal ideas Ability to demonstrate self-control will improve Ability to identify and develop effective coping behaviors will improve Ability to maintain clinical measurements within normal limits will improve Compliance with prescribed medications  will improve Ability to identify triggers associated with substance abuse/mental health issues will improve  Medication Management: Evaluate patient's response, side effects, and tolerance of medication regimen.  Therapeutic Interventions: 1 to 1 sessions, Unit Group sessions and Medication administration.  Evaluation of Outcomes: Progressing  Physician Treatment Plan for Secondary Diagnosis: Principal Problem:  Schizophrenia, paranoid type (HCC)  Long Term Goal(s): Improvement in symptoms so as ready for discharge   Short Term Goals: Ability to identify changes in lifestyle to reduce recurrence of condition will improve Ability to verbalize feelings will improve Ability to disclose and discuss suicidal ideas Ability to demonstrate self-control will improve Ability to identify and develop effective coping behaviors will improve Ability to maintain clinical measurements within normal limits will improve Compliance with prescribed medications will improve Ability to identify triggers associated with substance abuse/mental health issues will improve     Medication Management: Evaluate patient's response, side effects, and tolerance of medication regimen.  Therapeutic Interventions: 1 to 1 sessions, Unit Group sessions and Medication administration.  Evaluation of Outcomes: Progressing   RN Treatment Plan for Primary Diagnosis: Schizophrenia, paranoid type (HCC) Long Term Goal(s): Knowledge of disease and therapeutic regimen to maintain health will improve  Short Term Goals: Ability to demonstrate self-control, Ability to participate in decision making will improve, Ability to verbalize feelings will improve, Ability to disclose and discuss suicidal ideas, Ability to identify and develop effective coping behaviors will improve, and Compliance with prescribed medications will improve  Medication Management: RN will administer medications as ordered by provider, will assess and evaluate  patient's response and provide education to patient for prescribed medication. RN will report any adverse and/or side effects to prescribing provider.  Therapeutic Interventions: 1 on 1 counseling sessions, Psychoeducation, Medication administration, Evaluate responses to treatment, Monitor vital signs and CBGs as ordered, Perform/monitor CIWA, COWS, AIMS and Fall Risk screenings as ordered, Perform wound care treatments as ordered.  Evaluation of Outcomes: Progressing   LCSW Treatment Plan for Primary Diagnosis: Schizophrenia, paranoid type (HCC) Long Term Goal(s): Safe transition to appropriate next level of care at discharge, Engage patient in therapeutic group addressing interpersonal concerns.  Short Term Goals: Engage patient in aftercare planning with referrals and resources, Increase social support, Increase ability to appropriately verbalize feelings, Increase emotional regulation, Facilitate acceptance of mental health diagnosis and concerns, and Increase skills for wellness and recovery  Therapeutic Interventions: Assess for all discharge needs, 1 to 1 time with Social worker, Explore available resources and support systems, Assess for adequacy in community support network, Educate family and significant other(s) on suicide prevention, Complete Psychosocial Assessment, Interpersonal group therapy.  Evaluation of Outcomes: Progressing   Progress in Treatment: Attending groups: Yes. Participating in groups: Yes. Taking medication as prescribed: Yes. Toleration medication: Yes. Family/Significant other contact made: Yes, individual(s) contacted:  SPE completed with the patient's guardian.  Patient understands diagnosis: Yes. Discussing patient identified problems/goals with staff: No. Medical problems stabilized or resolved: Yes. Denies suicidal/homicidal ideation: No. Issues/concerns per patient self-inventory: No. Other: none  New problem(s) identified: No, Describe:  None  Update 02/15/22: No changes at this time. Update 02/18/22: No changes at this time. Update 02/23/2022:  No changes at this time.    New Short Term/Long Term Goal(s): Patient to work towards elimination of symptoms of psychosis, medication management for mood stabilization; development of comprehensive mental wellness plan. Update 02/15/22: No changes at this time. Update 02/18/22: No changes at this time.  Update 02/23/2022:  No changes at this time.    Patient Goals:  Patient was unable to provide goals during treatment team. Update 02/15/22: No changes at this time. Update 02/18/22: No changes at this time.  Update 02/23/2022:  No changes at this time.    Discharge Plan or Barriers:  CSW will assist pt with development of appropriate discharge/aftercare plan. Update 02/15/22:  No changes at this time. Update 02/18/22: No changes at this time.  Update 02/23/2022:  Patient declining referral for long term care.  Guardian at last discussion continues to recommend that patient go to a facility.     Reason for Continuation of Hospitalization: Delusions  Hallucinations Mania Medication stabilization   Estimated Length of Stay:  TBD Update 02/15/22: No changes at this time. Update 02/18/22: No changes at this time.  Update 02/23/2022:  No changes at this time.   Last 3 [place] Suicide Severity Risk Score: Flowsheet Row Admission (Current) from 02/05/2022 in [facility]PATIENT [facility] MEDICINE ED from 02/02/2022 in [facility] EMERGENCY DEPARTMENT ED from 01/18/2022 in [facility] EMERGENCY DEPARTMENT  C-SSRS RISK CATEGORY No Risk No Risk No Risk       Last PHQ 2/9 Scores:     No data to display          Scribe for Treatment Team: MERDITH BOYD, [place] 02/23/2022 9:11 AM

## 2022-02-23 NOTE — Progress Notes (Signed)
[facility] MD Progress Note  02/23/2022 11:23 AM Derrick Atkins  MRN:  [id number] Subjective: Follow-up for this 51 year old man with schizophrenia.  No new complaint.  Talked with me today about his computer and what he is looking forward to doing when he gets home.  Denies hallucinations.  Staff tells me that the patient has bizarre behavior in the evening but from what I can see seems to at least be generally getting better.  No acting out or dangerous behavior today.  Tolerating medicine. Principal Problem: Schizophrenia, paranoid type ([place]) Diagnosis: Principal Problem:   Schizophrenia, paranoid type ([place])  Total Time spent with patient: 30 minutes  Past Psychiatric History: Past history of schizophrenia  Past Medical History:  Past Medical History:  Diagnosis Date   Cerebral palsy ([place])    Depression    Diabetes mellitus without complication ([place])    Hypertension    Kidney stones    Schizoaffective disorder ([place])    Scoliosis     Past Surgical History:  Procedure Laterality Date   BOWEL RESECTION     LAPAROTOMY N/A 05/15/2020   Procedure: EXPLORATORY LAPAROTOMY;  Surgeon: Olean Ree, MD;  Location: [facility] ORS;  Service: General;  Laterality: N/A;   Family History:  Family History  Problem Relation Age of Onset   Heart disease Father    Heart attack Father    Family Psychiatric  History: See previous Social History:  Social History   Substance and Sexual Activity  Alcohol Use No     Social History   Substance and Sexual Activity  Drug Use No    Social History   Socioeconomic History   Marital status: Single    Spouse name: Not on file   Number of children: Not on file   Years of education: Not on file   Highest education level: Not on file  Occupational History   Not on file  Tobacco Use   Smoking status: Never   Smokeless tobacco: Never  Vaping Use   Vaping Use: Never used  Substance and Sexual Activity   Alcohol use: No   Drug use: No   Sexual  activity: Not Currently  Other Topics Concern   Not on file  Social History Narrative   Not on file   Social Determinants of Health   Financial Resource Strain: Not on file  Food Insecurity: Not on file  Transportation Needs: Not on file  Physical Activity: Not on file  Stress: Not on file  Social Connections: Not on file   Additional Social History:                         Sleep: Fair  Appetite:  Fair  Current Medications: Current Facility-Administered Medications  Medication Dose Route Frequency Provider Last Rate Last Admin   acetaminophen (TYLENOL) tablet 650 mg  650 mg Oral Q6H PRN Patrecia Pour, NP   650 mg at 02/21/22 0811   alum & mag hydroxide-simeth (MAALOX/MYLANTA) 200-200-20 MG/5ML suspension 30 mL  30 mL Oral Q4H PRN Patrecia Pour, NP       ARIPiprazole ER (ABILIFY MAINTENA) injection 400 mg  400 mg Intramuscular Q28 days Jhayla Podgorski T, MD   400 mg at 02/18/22 1416   aspirin EC tablet 81 mg  81 mg Oral Daily Patrecia Pour, NP   81 mg at 02/23/22 0733   benztropine (COGENTIN) tablet 1 mg  1 mg Oral Daily Yoltzin Ransom, [name], MD   1  mg at 02/23/22 0733   clonazePAM (KLONOPIN) disintegrating tablet 0.5 mg  0.5 mg Oral BID PRN Afreen Siebels T, MD   0.5 mg at 02/20/22 2114   fenofibrate tablet 54 mg  54 mg Oral Daily Charm Rings, NP   54 mg at 02/23/22 0734   gemfibrozil (LOPID) tablet 600 mg  600 mg Oral BID Charm Rings, NP   600 mg at 02/23/22 0734   haloperidol (HALDOL) tablet 5 mg  5 mg Oral Q6H PRN Lashena Signer, [name], MD   5 mg at 02/17/22 2124   Or   haloperidol lactate (HALDOL) injection 5 mg  5 mg Intramuscular Q6H PRN Charmagne Buhl, [name], MD   5 mg at 02/19/22 1712   hydrOXYzine (ATARAX) tablet 50 mg  50 mg Oral TID PRN Myles Mallicoat, [name], MD   50 mg at 02/21/22 0811   insulin aspart (novoLOG) injection 0-15 Units  0-15 Units Subcutaneous TID WC Shukri Nistler, [name], MD   2 Units at 02/22/22 1624   levothyroxine (SYNTHROID) tablet 88 mcg  88 mcg Oral  Daily Charm Rings, NP   88 mcg at 02/23/22 0647   magnesium hydroxide (MILK OF MAGNESIA) suspension 30 mL  30 mL Oral Daily PRN Charm Rings, NP   30 mL at 02/21/22 0811   metFORMIN (GLUCOPHAGE) tablet 1,000 mg  1,000 mg Oral BID WC Charm Rings, NP   1,000 mg at 02/23/22 3382   neomycin-bacitracin-polymyxin [phone] OINT   Topical BID Morty Ortwein, [name], MD   Given at 02/23/22 0734   ondansetron (ZOFRAN) tablet 4 mg  4 mg Oral Q6H PRN Sarina Ill, DO   4 mg at 02/06/22 1755   pioglitazone (ACTOS) tablet 45 mg  45 mg Oral Daily Charm Rings, NP   45 mg at 02/23/22 0734   simvastatin (ZOCOR) tablet 20 mg  20 mg Oral QHS Charm Rings, NP   20 mg at 02/22/22 2115   temazepam (RESTORIL) capsule 15 mg  15 mg Oral QHS PRN Ashaya Raftery, [name], MD   15 mg at 02/19/22 2300   temazepam (RESTORIL) capsule 15 mg  15 mg Oral QHS Leahanna Buser, [name], MD   15 mg at 02/22/22 2115    Lab Results:  Results for orders placed or performed during the hospital encounter of 02/05/22 (from the past 48 hour(s))  Glucose, capillary     Status: Abnormal   Collection Time: 02/21/22 11:27 AM  Result Value Ref Range   Glucose-Capillary 108 (H) 70 - 99 mg/dL    Comment: Glucose reference range applies only to samples taken after fasting for at least 8 hours.  Glucose, capillary     Status: Abnormal   Collection Time: 02/21/22  4:18 PM  Result Value Ref Range   Glucose-Capillary 144 (H) 70 - 99 mg/dL    Comment: Glucose reference range applies only to samples taken after fasting for at least 8 hours.  Glucose, capillary     Status: Abnormal   Collection Time: 02/22/22  7:11 AM  Result Value Ref Range   Glucose-Capillary 176 (H) 70 - 99 mg/dL    Comment: Glucose reference range applies only to samples taken after fasting for at least 8 hours.  Glucose, capillary     Status: Abnormal   Collection Time: 02/22/22 11:22 AM  Result Value Ref Range   Glucose-Capillary 125 (H) 70 - 99 mg/dL    Comment:  Glucose reference range applies only to samples  taken after fasting for at least 8 hours.  Glucose, capillary     Status: Abnormal   Collection Time: 02/22/22  4:22 PM  Result Value Ref Range   Glucose-Capillary 139 (H) 70 - 99 mg/dL    Comment: Glucose reference range applies only to samples taken after fasting for at least 8 hours.  Glucose, capillary     Status: Abnormal   Collection Time: 02/22/22  8:08 PM  Result Value Ref Range   Glucose-Capillary 105 (H) 70 - 99 mg/dL    Comment: Glucose reference range applies only to samples taken after fasting for at least 8 hours.  Glucose, capillary     Status: Abnormal   Collection Time: 02/23/22  6:37 AM  Result Value Ref Range   Glucose-Capillary 105 (H) 70 - 99 mg/dL    Comment: Glucose reference range applies only to samples taken after fasting for at least 8 hours.  Glucose, capillary     Status: Abnormal   Collection Time: 02/23/22 11:21 AM  Result Value Ref Range   Glucose-Capillary 125 (H) 70 - 99 mg/dL    Comment: Glucose reference range applies only to samples taken after fasting for at least 8 hours.    Blood Alcohol level:  Lab Results  Component Value Date   ETH <10 02/02/2022   ETH <10 01/18/2022    Metabolic Disorder Labs: Lab Results  Component Value Date   HGBA1C 7.0 (H) 02/08/2022   MPG 154.2 02/08/2022   MPG 116.89 05/13/2020   No results found for: "PROLACTIN" Lab Results  Component Value Date   CHOL 98 02/08/2022   TRIG 60 02/08/2022   HDL 44 02/08/2022   CHOLHDL 2.2 02/08/2022   VLDL 12 02/08/2022   LDLCALC 42 02/08/2022    Physical Findings: AIMS:  , ,  ,  ,    CIWA:    COWS:     Musculoskeletal: Strength & Muscle Tone: within normal limits Gait & Station: normal Patient leans: N/A  Psychiatric Specialty Exam:  Presentation  General Appearance: Disheveled  Eye Contact:None  Speech:Garbled; Pressured  Speech Volume:Increased  Handedness:Right   Mood and Affect  Mood:Dysphoric;  Irritable  Affect:Inappropriate; Full Range   Thought Process  Thought Processes:Disorganized  Descriptions of Associations:Loose  Orientation:Partial  Thought Content:Illogical; Tangential; Scattered; Delusions; Paranoid Ideation  History of Schizophrenia/Schizoaffective disorder:Yes  Duration of Psychotic Symptoms:Greater than six months  Hallucinations:No data recorded Ideas of Reference:Paranoia  Suicidal Thoughts:No data recorded Homicidal Thoughts:No data recorded  Sensorium  Memory:Immediate Poor; Recent Poor; Remote Poor  Judgment:Impaired  Insight:Lacking   Executive Functions  Concentration:Poor  Attention Span:Poor  Recall:Poor  Fund of Knowledge:Poor  Language:Poor   Psychomotor Activity  Psychomotor Activity:No data recorded  Assets  Assets:Financial Resources/Insurance; Physical Health; Resilience; Social Support   Sleep  Sleep:No data recorded   Physical Exam: Physical Exam Vitals and nursing note reviewed.  Constitutional:      Appearance: Normal appearance.  HENT:     Head: Normocephalic and atraumatic.     Mouth/Throat:     Pharynx: Oropharynx is clear.  Eyes:     Pupils: Pupils are equal, round, and reactive to light.  Cardiovascular:     Rate and Rhythm: Normal rate and regular rhythm.  Pulmonary:     Effort: Pulmonary effort is normal.     Breath sounds: Normal breath sounds.  Abdominal:     General: Abdomen is flat.     Palpations: Abdomen is soft.  Musculoskeletal:  General: Normal range of motion.  Skin:    General: Skin is warm and dry.  Neurological:     General: No focal deficit present.     Mental Status: He is alert. Mental status is at baseline.  Psychiatric:        Attention and Perception: Attention normal.        Mood and Affect: Mood normal. Affect is blunt.        Speech: Speech normal.        Behavior: Behavior is slowed.        Thought Content: Thought content normal.    Review of  Systems  Constitutional: Negative.   HENT: Negative.    Eyes: Negative.   Respiratory: Negative.    Cardiovascular: Negative.   Gastrointestinal: Negative.   Musculoskeletal: Negative.   Skin: Negative.   Neurological: Negative.   Psychiatric/Behavioral: Negative.     Blood pressure (!) 140/99, pulse 100, temperature 98.7 F (37.1 C), temperature source Oral, resp. rate 18, height 6\' 4"  (1.93 m), weight 109.3 kg, SpO2 97 %. Body mass index is 29.34 kg/m.   Treatment Plan Summary: Plan no change to medication.  We will contact his guardian and start working on plans for discharge back to the care of the ACT team.  Alethia Berthold, MD 02/23/2022, 11:23 AM

## 2022-02-23 NOTE — Plan of Care (Signed)
D: Pt alert and oriented. Pt rates depression 5/10 and anxiety 5/10. Pt reports energy level as hyper and concentration as being poor. Pt reports sleep last night as being poor. Pt denies experiencing any pain at this time. Pt reports experiencing any SI and AVH at this time. Pt verbally contracts for safety at while here. Pt reports having no plan while here. Pt does not give any details to AVH.    1119 Pt pushed into pharmacy tech attempting to not let her leave the medication room. Pt was redirected and assisted to clear area so the pharmacy tech could leave the medication room safety. Pt then became upset and did not initially want to allow this writer to check his CBG.   1330 pt was slamming door multiple times then pushed both call buttons in his bathroom. When this Clinical research associate and security went to check on pt and push cancel buttons pt was verbally aggressive and told this Clinical research associate and security "I just want you to get the hell out of my room!"   1645 Pt took MHT's rounding board and attempted to hide it, then became angry when it was obtained and secured. Pt began making facial snarls, cussing, and slamming door to room.   A: Scheduled medications administered to pt, per MD orders. Support and encouragement provided. Frequent verbal contact made. Routine safety checks conducted q15 minutes.   R: No adverse drug reactions noted. Pt verbally contracts for safety at this time. Pt compliant with medications and treatment plan. Pt interacts poorly with others on the unit. Pt remains safe at this time. Plan of care ongoing.   Problem: Nutrition: Goal: Adequate nutrition will be maintained Outcome: Progressing   Problem: Coping: Goal: Level of anxiety will decrease Outcome: Not Progressing

## 2022-02-24 DIAGNOSIS — F200 Paranoid schizophrenia: Secondary | ICD-10-CM | POA: Diagnosis not present

## 2022-02-24 LAB — GLUCOSE, CAPILLARY
Glucose-Capillary: 110 mg/dL — ABNORMAL HIGH (ref 70–99)
Glucose-Capillary: 138 mg/dL — ABNORMAL HIGH (ref 70–99)
Glucose-Capillary: 104 mg/dL — ABNORMAL HIGH (ref 70–99)

## 2022-02-24 MED ORDER — PIOGLITAZONE HCL 45 MG PO TABS: 45.0000 mg | ORAL_TABLET | Freq: Every day | ORAL | 1 refills | Status: DC

## 2022-02-24 MED ORDER — LEVOTHYROXINE SODIUM 88 MCG PO TABS: 88.0000 ug | ORAL_TABLET | Freq: Every day | ORAL | 1 refills | Status: DC

## 2022-02-24 MED ORDER — GEMFIBROZIL 600 MG PO TABS: 600.0000 mg | ORAL_TABLET | Freq: Two times a day (BID) | ORAL | 1 refills | Status: DC

## 2022-02-24 MED ORDER — FENOFIBRATE 54 MG PO TABS: 54.0000 mg | ORAL_TABLET | Freq: Every day | ORAL | 1 refills | Status: DC

## 2022-02-24 MED ORDER — BENZTROPINE MESYLATE 1 MG PO TABS: 1.0000 mg | ORAL_TABLET | Freq: Every day | ORAL | 1 refills | Status: DC

## 2022-02-24 MED ORDER — TEMAZEPAM 15 MG PO CAPS: 15.0000 mg | ORAL_CAPSULE | Freq: Every day | ORAL | 1 refills | Status: DC

## 2022-02-24 MED ORDER — SIMVASTATIN 20 MG PO TABS: 20.0000 mg | ORAL_TABLET | Freq: Every day | ORAL | 1 refills | Status: DC

## 2022-02-24 MED ORDER — METFORMIN HCL 1000 MG PO TABS: 1000.0000 mg | ORAL_TABLET | Freq: Two times a day (BID) | ORAL | 1 refills | Status: DC

## 2022-02-24 MED ORDER — ASPIRIN 81 MG PO TBEC: 81.0000 mg | DELAYED_RELEASE_TABLET | Freq: Every day | ORAL | 1 refills | Status: DC

## 2022-02-24 MED ORDER — ASPIRIN 81 MG PO TBEC
81.0000 mg | DELAYED_RELEASE_TABLET | Freq: Every day | ORAL | 1 refills | Status: DC
Start: 2022-02-25 — End: 2022-04-09

## 2022-02-24 MED ORDER — ARIPIPRAZOLE ER 400 MG IM SRER: 400.0000 mg | INTRAMUSCULAR | 1 refills | Status: DC

## 2022-02-24 NOTE — Group Note (Signed)
[facility] LCSW Group Therapy Note   Group Date: 02/24/2022 Start Time: 1330 End Time: 1430   Type of Therapy/Topic:  Group Therapy:  Emotion Regulation  Participation Level:  Minimal    Description of Group:    The purpose of this group is to assist patients in learning to regulate negative emotions and experience positive emotions. Patients will be guided to discuss ways in which they have been vulnerable to their negative emotions. These vulnerabilities will be juxtaposed with experiences of positive emotions or situations, and patients challenged to use positive emotions to combat negative ones. Special emphasis will be placed on coping with negative emotions in conflict situations, and patients will process healthy conflict resolution skills.  Therapeutic Goals: Patient will identify two positive emotions or experiences to reflect on in order to balance out negative emotions:  Patient will label two or more emotions that they find the most difficult to experience:  Patient will be able to demonstrate positive conflict resolution skills through discussion or role plays:   Summary of Patient Progress: Patient was present for the majority of the meeting leaving approximately five-ten minute prior to end of discussion. During his time in there he spoke about grief from the loss of his mother. Outside of this pt, did not participate in the discussion further. However, he was appropriate during the process.    Therapeutic Modalities:   Cognitive Behavioral Therapy Feelings Identification Dialectical Behavioral Therapy   Glenis Smoker, LCSW

## 2022-02-24 NOTE — [facility] Counselor (Signed)
CSW phoned guardian, [name] ([phone]) to update regarding discharge. Unable to make contact but HIPPA compliant voicemail left with contact information for follow through.   Vilma Meckel. Algis Greenhouse, MSW, LCSW, LCAS 02/24/2022 9:52 AM

## 2022-02-24 NOTE — Progress Notes (Signed)
  [facility] Adult Case Management Discharge Plan :  Will you be returning to the same living situation after discharge:  Yes,  pt to return home. At discharge, do you have transportation home?: Yes,  CSW to arrange transportation home. Do you have the ability to pay for your medications: Yes,  [employer].  Release of information consent forms completed and in the chart;  Patient's signature needed at discharge.  Patient to Follow up at:  Follow-up Information     [address] [facility] [place] [employer]. Follow up.   Why: Your team will follow up with you on tomorrow, 02/25/22. You will be seeing the doctor on Monday, 03/01/22. Thanks! Contact information: [address] [place] [address] [phone]                 Next level of care provider has access to [facility] Link:no  Safety Planning and Suicide Prevention discussed: Yes,  SPE completed with guardian, [name].     Has patient been referred to the Quitline?: Patient refused referral  Patient has been referred for addiction treatment: N/A  Glenis Smoker, LCSW 02/24/2022, 3:38 PM

## 2022-02-24 NOTE — Progress Notes (Signed)
Patient denies SI,HI and AVH. Appropriate with staff & peers.Verbalized understanding discharge instructions and follow up care. All belongings returned from [employer]. Patient escorted out by staff and transported by cab.

## 2022-02-24 NOTE — Progress Notes (Signed)
Patient restless tonight and became more and more agitated with the MHT working. During the taking of his V/S patient became irate at the MHT, cussing her our, taking her keys from her pocket, calling her a bitch and raising his hand saying he was going to slap her. Staff along with security came and verbally deescalated the situation and got the patient back to his room. Pt came back out and started posturing towards the MHT, hitting his fist into his hand. Pt was escorted to his room and given PRN shot, check MAR. Pt willingly took the shot in his left arm, administered by this Clinical research associate.

## 2022-02-24 NOTE — Care Management Important Message (Signed)
Important Message  Patient Details  Name: Derrick Atkins MRN: [id number] Date of Birth: March 05, 1971   Medicare Important Message Given:  Other (see comment) (CSW left voicemail regarding fact that guardian can appeal discharge.)     Glenis Smoker, LCSW 02/24/2022, 2:38 PM

## 2022-02-24 NOTE — Progress Notes (Signed)
Recreation Therapy Notes   Date: 02/24/2022   Time: 10:50 am     Location: Courtyard    Behavioral response: Appropriate  Intervention Topic:  Leisure    Discussion/Intervention:  Group content today was focused on leisure. The group defined what leisure is and some positive leisure activities they participate in. Individuals identified the difference between good and bad leisure. Participants expressed how they feel after participating in the leisure of their choice. The group discussed how they go about picking a leisure activity and if others are involved in their leisure activities. The patient stated how many leisure activities they have to choose from and reasons why it is important to have leisure time. Individuals participated in the intervention "Exploration of Leisure" where they had a chance to identify new leisure activities as well as benefits of leisure. Clinical Observations/Feedback: Patient came to group and was able to identify leisure activities that they participate in outside of the hospital. Individual was social with peers and staff while participating in the intervention.  Derrick Atkins LRT/CTRS        Derrick Atkins 02/24/2022 1:49 PM

## 2022-02-24 NOTE — [facility] Suicide Risk Assessment (Signed)
[facility] Discharge Suicide Risk Assessment   Principal Problem: Schizophrenia, paranoid type [facility]) Discharge Diagnoses: Principal Problem:   Schizophrenia, paranoid type (HCC)   Total Time spent with patient: 30 minutes  Musculoskeletal: Strength & Muscle Tone: within normal limits Gait & Station: normal Patient leans: N/A  Psychiatric Specialty Exam  Presentation  General Appearance: Disheveled  Eye Contact:None  Speech:Garbled; Pressured  Speech Volume:Increased  Handedness:Right   Mood and Affect  Mood:Dysphoric; Irritable  Duration of Depression Symptoms: Greater than two weeks  Affect:Inappropriate; Full Range   Thought Process  Thought Processes:Disorganized  Descriptions of Associations:Loose  Orientation:Partial  Thought Content:Illogical; Tangential; Scattered; Delusions; Paranoid Ideation  History of Schizophrenia/Schizoaffective disorder:Yes  Duration of Psychotic Symptoms:Greater than six months  Hallucinations:No data recorded Ideas of Reference:Paranoia  Suicidal Thoughts:No data recorded Homicidal Thoughts:No data recorded  Sensorium  Memory:Immediate Poor; Recent Poor; Remote Poor  Judgment:Impaired  Insight:Lacking   Executive Functions  Concentration:Poor  Attention Span:Poor  Recall:Poor  Fund of Knowledge:Poor  Language:Poor   Psychomotor Activity  Psychomotor Activity:No data recorded  Assets  Assets:Financial Resources/Insurance; Physical Health; Resilience; Social Support   Sleep  Sleep:No data recorded  Physical Exam: Physical Exam Vitals and nursing note reviewed.  Constitutional:      Appearance: Normal appearance.  HENT:     Head: Normocephalic and atraumatic.     Mouth/Throat:     Pharynx: Oropharynx is clear.  Eyes:     Pupils: Pupils are equal, round, and reactive to light.  Cardiovascular:     Rate and Rhythm: Normal rate and regular rhythm.  Pulmonary:     Effort: Pulmonary effort is normal.      Breath sounds: Normal breath sounds.  Abdominal:     General: Abdomen is flat.     Palpations: Abdomen is soft.  Musculoskeletal:        General: Normal range of motion.  Skin:    General: Skin is warm and dry.  Neurological:     General: No focal deficit present.     Mental Status: He is alert. Mental status is at baseline.  Psychiatric:        Attention and Perception: Attention normal.        Mood and Affect: Mood normal. Affect is blunt.        Speech: Speech is delayed.        Behavior: Behavior is slowed.        Thought Content: Thought content normal.        Cognition and Memory: Cognition is impaired.        Judgment: Judgment is impulsive.    Review of Systems  Constitutional: Negative.   HENT: Negative.    Eyes: Negative.   Respiratory: Negative.    Cardiovascular: Negative.   Gastrointestinal: Negative.   Musculoskeletal: Negative.   Skin: Negative.   Neurological: Negative.   Psychiatric/Behavioral: Negative.     Blood pressure (!) 143/75, pulse 93, temperature 98.6 F (37 C), temperature source Oral, resp. rate 20, height 6\' 4"  (1.93 m), weight 109.3 kg, SpO2 100 %. Body mass index is 29.34 kg/m.  Mental Status Per Nursing Assessment::   On Admission:  NA  Demographic Factors:  Caucasian and Living alone  Loss Factors: Financial problems/change in socioeconomic status  Historical Factors: Impulsivity  Risk Reduction Factors:   Positive therapeutic relationship  Continued Clinical Symptoms:  Schizophrenia:   Paranoid or undifferentiated type  Cognitive Features That Contribute To Risk:  Closed-mindedness    Suicide Risk:  Minimal: No identifiable  suicidal ideation.  Patients presenting with no risk factors but with morbid ruminations; may be classified as minimal risk based on the severity of the depressive symptoms    Plan Of Care/Follow-up recommendations:  Other:  Follow-up with outpatient treatment.  Follow up with ACT team.   Continue current medicine.  Patient is not showing any signs of self-injury or dangerous behavior to himself and is denying any suicidal ideation.  Mordecai Rasmussen, MD 02/24/2022, 10:24 AM

## 2022-02-24 NOTE — Discharge Summary (Signed)
Physician Discharge Summary Note  Patient:  Derrick Atkins is an 51 y.o., male MRN:  [id number] DOB:  1971/06/09 Patient phone:  [phone] (home)  Patient address:   [address] [place] [address],  Total Time spent with patient: 30 minutes  Date of Admission:  02/05/2022 Date of Discharge: 02/24/2022  Reason for Admission: Admitted after presenting with worsening psychosis medicine noncompliance  Principal Problem: Schizophrenia, paranoid type [facility]) Discharge Diagnoses: Principal Problem:   Schizophrenia, paranoid type (HCC)   Past Psychiatric History: Past history of schizophrenia.  Previous admissions.  Past Medical History:  Past Medical History:  Diagnosis Date   Cerebral palsy (HCC)    Depression    Diabetes mellitus without complication (HCC)    Hypertension    Kidney stones    Schizoaffective disorder (HCC)    Scoliosis     Past Surgical History:  Procedure Laterality Date   BOWEL RESECTION     LAPAROTOMY N/A 05/15/2020   Procedure: EXPLORATORY LAPAROTOMY;  Surgeon: Henrene Dodge, MD;  Location: [facility] ORS;  Service: General;  Laterality: N/A;   Family History:  Family History  Problem Relation Age of Onset   Heart disease Father    Heart attack Father    Family Psychiatric  History: See previous Social History:  Social History   Substance and Sexual Activity  Alcohol Use No     Social History   Substance and Sexual Activity  Drug Use No    Social History   Socioeconomic History   Marital status: Single    Spouse name: Not on file   Number of children: Not on file   Years of education: Not on file   Highest education level: Not on file  Occupational History   Not on file  Tobacco Use   Smoking status: Never   Smokeless tobacco: Never  Vaping Use   Vaping Use: Never used  Substance and Sexual Activity   Alcohol use: No   Drug use: No   Sexual activity: Not Currently  Other Topics Concern   Not on file  Social History Narrative    Not on file   Social Determinants of Health   Financial Resource Strain: Not on file  Food Insecurity: Not on file  Transportation Needs: Not on file  Physical Activity: Not on file  Stress: Not on file  Social Connections: Not on file    Hospital Course: Admitted to psychiatric hospital.  Continue 15-minute precautions.  Patient was bizarre in his behavior.  Would have labile spells of occasionally becoming very agitated and disruptive on the unit while at other times being very withdrawn.  Patient did show some response to Abilify dose was increased up to 40 mg a day.  He appeared to be getting over sedated and probably having dystonic reactions.  Oral medicine was discontinued and patient agreed to take an injection.  He was given Abilify maintainer.  Oral medicines were mostly discontinued.  Blood sugars stayed actually pretty stable without needing insulin just with oral agents.  Even at discharge continued to have some disruptive behaviors at times being rude unpleasant with staff stealing objects on the unit but no longer appeared to be grossly bizarre and was able to hold a lucid conversation.  No indication of acute dangerousness to self or others.  Discharged with plans to continue follow-up with ACT team  Physical Findings: AIMS:  , ,  ,  ,    CIWA:    COWS:     Musculoskeletal:  Strength & Muscle Tone: within normal limits Gait & Station: normal Patient leans: N/A   Psychiatric Specialty Exam:  Presentation  General Appearance: Disheveled  Eye Contact:None  Speech:Garbled; Pressured  Speech Volume:Increased  Handedness:Right   Mood and Affect  Mood:Dysphoric; Irritable  Affect:Inappropriate; Full Range   Thought Process  Thought Processes:Disorganized  Descriptions of Associations:Loose  Orientation:Partial  Thought Content:Illogical; Tangential; Scattered; Delusions; Paranoid Ideation  History of Schizophrenia/Schizoaffective disorder:Yes  Duration  of Psychotic Symptoms:Greater than six months  Hallucinations:No data recorded Ideas of Reference:Paranoia  Suicidal Thoughts:No data recorded Homicidal Thoughts:No data recorded  Sensorium  Memory:Immediate Poor; Recent Poor; Remote Poor  Judgment:Impaired  Insight:Lacking   Executive Functions  Concentration:Poor  Attention Span:Poor  Recall:Poor  Fund of Knowledge:Poor  Language:Poor   Psychomotor Activity  Psychomotor Activity:No data recorded  Assets  Assets:Financial Resources/Insurance; Physical Health; Resilience; Social Support   Sleep  Sleep:No data recorded   Physical Exam: Physical Exam Vitals and nursing note reviewed.  Constitutional:      Appearance: Normal appearance.  HENT:     Head: Normocephalic and atraumatic.     Mouth/Throat:     Pharynx: Oropharynx is clear.  Eyes:     Pupils: Pupils are equal, round, and reactive to light.  Cardiovascular:     Rate and Rhythm: Normal rate and regular rhythm.  Pulmonary:     Effort: Pulmonary effort is normal.     Breath sounds: Normal breath sounds.  Abdominal:     General: Abdomen is flat.     Palpations: Abdomen is soft.  Musculoskeletal:        General: Normal range of motion.  Skin:    General: Skin is warm and dry.  Neurological:     General: No focal deficit present.     Mental Status: He is alert. Mental status is at baseline.  Psychiatric:        Attention and Perception: Attention normal.        Mood and Affect: Mood normal. Affect is blunt.        Speech: Speech normal.        Behavior: Behavior is cooperative.        Thought Content: Thought content normal.        Cognition and Memory: Cognition is impaired.    Review of Systems  Constitutional: Negative.   HENT: Negative.    Eyes: Negative.   Respiratory: Negative.    Cardiovascular: Negative.   Gastrointestinal: Negative.   Musculoskeletal: Negative.   Skin: Negative.   Neurological: Negative.    Psychiatric/Behavioral: Negative.     Blood pressure (!) 143/75, pulse 93, temperature 98.6 F (37 C), temperature source Oral, resp. rate 20, height 6\' 4"  (1.93 m), weight 109.3 kg, SpO2 100 %. Body mass index is 29.34 kg/m.   Social History   Tobacco Use  Smoking Status Never  Smokeless Tobacco Never   Tobacco Cessation:  N/A, patient does not currently use tobacco products   Blood Alcohol level:  Lab Results  Component Value Date   ETH <10 02/02/2022   ETH <10 01/18/2022    Metabolic Disorder Labs:  Lab Results  Component Value Date   HGBA1C 7.0 (H) 02/08/2022   MPG 154.2 02/08/2022   MPG 116.89 05/13/2020   No results found for: "PROLACTIN" Lab Results  Component Value Date   CHOL 98 02/08/2022   TRIG 60 02/08/2022   HDL 44 02/08/2022   CHOLHDL 2.2 02/08/2022   VLDL 12 02/08/2022   LDLCALC 42 02/08/2022  See Psychiatric Specialty Exam and Suicide Risk Assessment completed by Attending Physician prior to discharge.  Discharge destination:  Home  Is patient on multiple antipsychotic therapies at discharge:  No   Has Patient had three or more failed trials of antipsychotic monotherapy by history:  No  Recommended Plan for Multiple Antipsychotic Therapies: NA  Discharge Instructions     Diet - low sodium heart healthy   Complete by: As directed    Increase activity slowly   Complete by: As directed       Allergies as of 02/24/2022       Reactions   Phenytoin Sodium Extended Other (See Comments), Nausea And Vomiting   Prednisone Other (See Comments)   Latex Hives, Nausea And Vomiting, Rash        Medication List     STOP taking these medications    ARIPiprazole 10 MG tablet Commonly known as: ABILIFY   ARIPiprazole 20 MG tablet Commonly known as: ABILIFY Replaced by: ARIPiprazole ER 400 MG Srer injection   buPROPion 300 MG 24 hr tablet Commonly known as: WELLBUTRIN XL   buPROPion 75 MG tablet Commonly known as: WELLBUTRIN    cyclobenzaprine 5 MG tablet Commonly known as: FLEXERIL   divalproex 500 MG 24 hr tablet Commonly known as: DEPAKOTE ER   divalproex 500 MG DR tablet Commonly known as: DEPAKOTE   Insulin Lispro Prot & Lispro (75-25) 100 UNIT/ML Kwikpen Commonly known as: HUMALOG 75/25 MIX   lidocaine 5 % Commonly known as: Lidoderm   lisinopril 10 MG tablet Commonly known as: ZESTRIL   metoprolol succinate 50 MG 24 hr tablet Commonly known as: TOPROL-XL   naproxen 500 MG tablet Commonly known as: Naprosyn   NovoLOG Mix 70/30 FlexPen (70-30) 100 UNIT/ML FlexPen Generic drug: insulin aspart protamine - aspart   polyethylene glycol 17 g packet Commonly known as: MIRALAX / GLYCOLAX       TAKE these medications      Indication  ARIPiprazole ER 400 MG Srer injection Commonly known as: ABILIFY MAINTENA Inject 2 mLs (400 mg total) into the muscle every 28 (twenty-eight) days. Next dose due 03/18/22 Start taking on: March 18, 2022 Replaces: ARIPiprazole 20 MG tablet  Indication: Schizophrenia   aspirin EC 81 MG tablet Take 1 tablet (81 mg total) by mouth daily. Swallow whole. Start taking on: February 25, 2022 What changed: additional instructions  Indication: Disease involving a Thrombosis or an Embolism   benztropine 1 MG tablet Commonly known as: COGENTIN Take 1 tablet (1 mg total) by mouth daily. Start taking on: February 25, 2022  Indication: Extrapyramidal Reaction caused by Medications   fenofibrate 54 MG tablet Take 1 tablet (54 mg total) by mouth daily. Start taking on: February 25, 2022  Indication: Elevation of Both Cholesterol and Triglycerides in Blood   gemfibrozil 600 MG tablet Commonly known as: LOPID Take 1 tablet (600 mg total) by mouth 2 (two) times daily.  Indication: High Amount of Triglycerides in the Blood   levothyroxine 88 MCG tablet Commonly known as: SYNTHROID Take 1 tablet (88 mcg total) by mouth daily.  Indication: Underactive Thyroid   metFORMIN  1000 MG tablet Commonly known as: GLUCOPHAGE Take 1 tablet (1,000 mg total) by mouth 2 (two) times daily with a meal.  Indication: Type 2 Diabetes   pioglitazone 45 MG tablet Commonly known as: ACTOS Take 1 tablet (45 mg total) by mouth daily. Start taking on: February 25, 2022  Indication: Type 2 Diabetes   simvastatin 20 MG  tablet Commonly known as: ZOCOR Take 1 tablet (20 mg total) by mouth at bedtime.  Indication: High Amount of Fats in the Blood   temazepam 15 MG capsule Commonly known as: RESTORIL Take 1 capsule (15 mg total) by mouth at bedtime.  Indication: Trouble Sleeping        Follow-up Information     [facility] [place] [employer]. Follow up.   Why: Your team will follow up with you on... Contact information: [address] [place] [address] [phone]                 Follow-up recommendations:  Other:  Follow-up with [name]  Comments: Prescriptions provided and sent to [facility] with the request of the [employer] team  Signed: Mordecai Rasmussen, MD 02/24/2022, 2:06 PM

## 2022-02-26 ENCOUNTER — Emergency Department
Admission: EM | Admit: 2022-02-26 | Discharge: 2022-02-26 | Disposition: A | Discharge status: Home / Self Care | Payer: Medicare Other | Attending: Student in an Organized Health Care Education/Training Program | Admitting: Student in an Organized Health Care Education/Training Program

## 2022-02-26 ENCOUNTER — Encounter: Payer: Self-pay | Admitting: Emergency Medicine

## 2022-02-26 ENCOUNTER — Other Ambulatory Visit: Payer: Self-pay

## 2022-02-26 DIAGNOSIS — F209 Schizophrenia, unspecified: Secondary | ICD-10-CM | POA: Diagnosis present

## 2022-02-26 DIAGNOSIS — E119 Type 2 diabetes mellitus without complications: Secondary | ICD-10-CM | POA: Insufficient documentation

## 2022-02-26 DIAGNOSIS — F22 Delusional disorders: Secondary | ICD-10-CM | POA: Insufficient documentation

## 2022-02-26 DIAGNOSIS — Z20822 Contact with and (suspected) exposure to covid-19: Secondary | ICD-10-CM | POA: Insufficient documentation

## 2022-02-26 DIAGNOSIS — I10 Essential (primary) hypertension: Secondary | ICD-10-CM | POA: Diagnosis not present

## 2022-02-26 DIAGNOSIS — F419 Anxiety disorder, unspecified: Secondary | ICD-10-CM | POA: Diagnosis not present

## 2022-02-26 DIAGNOSIS — Z87442 Personal history of urinary calculi: Secondary | ICD-10-CM | POA: Insufficient documentation

## 2022-02-26 DIAGNOSIS — R45851 Suicidal ideations: Secondary | ICD-10-CM | POA: Diagnosis not present

## 2022-02-26 DIAGNOSIS — F200 Paranoid schizophrenia: Secondary | ICD-10-CM | POA: Diagnosis present

## 2022-02-26 LAB — CBC
HCT: 40 % (ref 39.0–52.0)
Hemoglobin: 13.2 g/dL (ref 13.0–17.0)
MCH: 26.5 pg (ref 26.0–34.0)
MCHC: 33 g/dL (ref 30.0–36.0)
MCV: 80.2 fL (ref 80.0–100.0)
Platelets: 244 10*3/uL (ref 150–400)
RBC: 4.99 MIL/uL (ref 4.22–5.81)
RDW: 14.8 % (ref 11.5–15.5)
WBC: 8.7 10*3/uL (ref 4.0–10.5)
nRBC: 0 % (ref 0.0–0.2)

## 2022-02-26 LAB — COMPREHENSIVE METABOLIC PANEL
ALT: 27 U/L (ref 0–44)
AST: 37 U/L (ref 15–41)
Albumin: 4.4 g/dL (ref 3.5–5.0)
Alkaline Phosphatase: 69 U/L (ref 38–126)
Anion gap: 9 (ref 5–15)
BUN: 17 mg/dL (ref 6–20)
CO2: 23 mmol/L (ref 22–32)
Calcium: 9.7 mg/dL (ref 8.9–10.3)
Chloride: 108 mmol/L (ref 98–111)
Creatinine, Ser: 0.83 mg/dL (ref 0.61–1.24)
GFR, Estimated: >60 mL/min (ref >60)
Glucose, Bld: 92 mg/dL (ref 70–99)
Potassium: 3.9 mmol/L (ref 3.5–5.1)
Sodium: 140 mmol/L (ref 135–145)
Total Bilirubin: 0.8 mg/dL (ref 0.3–1.2)
Total Protein: 7.3 g/dL (ref 6.5–8.1)

## 2022-02-26 LAB — RESP PANEL BY RT-PCR (FLU A&B, COVID) ARPGX2
Influenza A by PCR: NEGATIVE
Influenza B by PCR: NEGATIVE
SARS Coronavirus 2 by RT PCR: NEGATIVE

## 2022-02-26 LAB — ETHANOL: Alcohol, Ethyl (B): <10 mg/dL (ref <10)

## 2022-02-26 LAB — SALICYLATE LEVEL: Salicylate Lvl: <7 mg/dL — ABNORMAL LOW (ref 7.0–30.0)

## 2022-02-26 LAB — ACETAMINOPHEN LEVEL: Acetaminophen (Tylenol), Serum: <10 ug/mL — ABNORMAL LOW (ref 10–30)

## 2022-02-26 NOTE — Consult Note (Signed)
Derrick Atkins   Reason for Atkins:  suicidal ideations Referring Physician:  EDP Patient Identification: Derrick Atkins MRN:  [id number] Principal Diagnosis: Schizophrenia, paranoid, chronic ([place]) Diagnosis:  Principal Problem:   Schizophrenia, paranoid, chronic ([place])   Total Time spent with patient: 45 minutes  Subjective:   Derrick Atkins is a 51 y.o. male patient admitted with suicidal ideations.  HPI:  51 yo male with schizophrenia, discharged from the unit after a 19 day stay.  He reports he does not like his group home and told the nurse it was because it was cold there.  He is well known to the ED and tells staff he likes it here.  He is not psychotic.  Based on prior assessment, he is at his baseline with no threat to himself or others.  He has passive, intermittent suicidal ideations with no plan which is often his baseline.  Explained that if he does not like his group home, he will need to discuss this with his guardian.  He has lived there for 14 years, psych cleared.  Past Psychiatric History: schizophrenia  Risk to Self:  none Risk to Others:  none Prior Inpatient Therapy:  multiple times Prior Outpatient Therapy:  in place  Past Medical History:  Past Medical History:  Diagnosis Date   Cerebral palsy ([place])    Depression    Diabetes mellitus without complication ([place])    Hypertension    Kidney stones    Schizoaffective disorder ([place])    Scoliosis     Past Surgical History:  Procedure Laterality Date   BOWEL RESECTION     LAPAROTOMY N/A 05/15/2020   Procedure: EXPLORATORY LAPAROTOMY;  Surgeon: Olean Ree, MD;  Location: [facility] ORS;  Service: General;  Laterality: N/A;   Family History:  Family History  Problem Relation Age of Onset   Heart disease Father    Heart attack Father    Family Psychiatric  History: see above Social History:  Social History   Substance and Sexual Activity  Alcohol Use No     Social History    Substance and Sexual Activity  Drug Use No    Social History   Socioeconomic History   Marital status: Single    Spouse name: Not on file   Number of children: Not on file   Years of education: Not on file   Highest education level: Not on file  Occupational History   Not on file  Tobacco Use   Smoking status: Never   Smokeless tobacco: Never  Vaping Use   Vaping Use: Never used  Substance and Sexual Activity   Alcohol use: No   Drug use: No   Sexual activity: Not Currently  Other Topics Concern   Not on file  Social History Narrative   Not on file   Social Determinants of Health   Financial Resource Strain: Not on file  Food Insecurity: Not on file  Transportation Needs: Not on file  Physical Activity: Not on file  Stress: Not on file  Social Connections: Not on file   Additional Social History:    Allergies:   Allergies  Allergen Reactions   Phenytoin Sodium Extended Other (See Comments) and Nausea And Vomiting   Prednisone Other (See Comments)   Latex Hives, Nausea And Vomiting and Rash    Labs:  Results for orders placed or performed during the hospital encounter of 02/26/22 (from the past 48 hour(s))  Comprehensive metabolic panel     Status: None  Collection Time: 02/26/22  2:44 PM  Result Value Ref Range   Sodium 140 135 - 145 mmol/L   Potassium 3.9 3.5 - 5.1 mmol/L   Chloride 108 98 - 111 mmol/L   CO2 23 22 - 32 mmol/L   Glucose, Bld 92 70 - 99 mg/dL    Comment: Glucose reference range applies only to samples taken after fasting for at least 8 hours.   BUN 17 6 - 20 mg/dL   Creatinine, Ser 1.610.83 0.61 - 1.24 mg/dL   Calcium 9.7 8.9 - 09.610.3 mg/dL   Total Protein 7.3 6.5 - 8.1 g/dL   Albumin 4.4 3.5 - 5.0 g/dL   AST 37 15 - 41 U/L   ALT 27 0 - 44 U/L   Alkaline Phosphatase 69 38 - 126 U/L   Total Bilirubin 0.8 0.3 - 1.2 mg/dL   GFR, Estimated >04>60 >54>60 mL/min    Comment: (NOTE) Calculated using the CKD-EPI Creatinine Equation (2021)     Anion gap 9 5 - 15    Comment: Performed at [facility], [address] [place], [place] [address]  Ethanol     Status: None   Collection Time: 02/26/22  2:44 PM  Result Value Ref Range   Alcohol, Ethyl (B) <10 <10 mg/dL    Comment: (NOTE) Lowest detectable limit for serum alcohol is 10 mg/dL.  For medical purposes only. Performed at [facility], [address] [place], [place] [address]   Salicylate level     Status: Abnormal   Collection Time: 02/26/22  2:44 PM  Result Value Ref Range   Salicylate Lvl <7.0 (L) 7.0 - 30.0 mg/dL    Comment: Performed at [facility], [address] [place], [place] [address]  Acetaminophen level     Status: Abnormal   Collection Time: 02/26/22  2:44 PM  Result Value Ref Range   Acetaminophen (Tylenol), Serum <10 (L) 10 - 30 ug/mL    Comment: (NOTE) Therapeutic concentrations vary significantly. A range of 10-30 ug/mL  may be an effective concentration for many patients. However, some  are best treated at concentrations outside of this range. Acetaminophen concentrations >150 ug/mL at 4 hours after ingestion  and >50 ug/mL at 12 hours after ingestion are often associated with  toxic reactions.  Performed at [facility], [address] [place], [place] [address]   cbc     Status: None   Collection Time: 02/26/22  2:44 PM  Result Value Ref Range   WBC 8.7 4.0 - 10.5 K/uL   RBC 4.99 4.22 - 5.81 MIL/uL   Hemoglobin 13.2 13.0 - 17.0 g/dL   HCT 57.840.0 46.939.0 - 62.952.0 %   MCV 80.2 80.0 - 100.0 fL   MCH 26.5 26.0 - 34.0 pg   MCHC 33.0 30.0 - 36.0 g/dL   RDW 52.814.8 41.311.5 - 24.415.5 %   Platelets 244 150 - 400 K/uL   nRBC 0.0 0.0 - 0.2 %    Comment: Performed at [facility], [address] [place], [place] [address]  Resp Panel by RT-PCR (Flu A&B, Covid) Anterior Nasal Swab     Status: None   Collection Time: 02/26/22  3:24 PM   Specimen: Anterior Nasal Swab  Result Value Ref Range   SARS  Coronavirus 2 by RT PCR NEGATIVE NEGATIVE    Comment: (NOTE) SARS-CoV-2 target nucleic acids are NOT DETECTED.  The SARS-CoV-2 RNA is generally detectable in upper respiratory specimens during the acute phase of  infection. The lowest concentration of SARS-CoV-2 viral copies this assay can detect is 138 copies/mL. A negative result does not preclude SARS-Cov-2 infection and should not be used as the sole basis for treatment or other patient management decisions. A negative result may occur with  improper specimen collection/handling, submission of specimen other than nasopharyngeal swab, presence of viral mutation(s) within the areas targeted by this assay, and inadequate number of viral copies(<138 copies/mL). A negative result must be combined with clinical observations, patient history, and epidemiological information. The expected result is Negative.  Fact Sheet for Patients:  BloggerCourse.com  Fact Sheet for Healthcare Providers:  SeriousBroker.it  This test is no t yet approved or cleared by the [place] FDA and  has been authorized for detection and/or diagnosis of SARS-CoV-2 by FDA under an Emergency Use Authorization (EUA). This EUA will remain  in effect (meaning this test can be used) for the duration of the COVID-19 declaration under Section 564(b)(1) of the Act, 21 U.S.C.section 360bbb-3(b)(1), unless the authorization is terminated  or revoked sooner.       Influenza A by PCR NEGATIVE NEGATIVE   Influenza B by PCR NEGATIVE NEGATIVE    Comment: (NOTE) The Xpert Xpress SARS-CoV-2/FLU/RSV plus assay is intended as an aid in the diagnosis of influenza from Nasopharyngeal swab specimens and should not be used as a sole basis for treatment. Nasal washings and aspirates are unacceptable for Xpert Xpress SARS-CoV-2/FLU/RSV testing.  Fact Sheet for Patients: BloggerCourse.com  Fact Sheet  for Healthcare Providers: SeriousBroker.it  This test is not yet approved or cleared by the [place] FDA and has been authorized for detection and/or diagnosis of SARS-CoV-2 by FDA under an Emergency Use Authorization (EUA). This EUA will remain in effect (meaning this test can be used) for the duration of the COVID-19 declaration under Section 564(b)(1) of the Act, 21 U.S.C. section 360bbb-3(b)(1), unless the authorization is terminated or revoked.  Performed at [facility], [address] [place], [place] [address]     No current facility-administered medications for this encounter.   Current Outpatient Medications  Medication Sig Dispense Refill   [START ON 03/18/2022] ARIPiprazole ER (ABILIFY MAINTENA) 400 MG SRER injection Inject 2 mLs (400 mg total) into the muscle every 28 (twenty-eight) days. Next dose due 03/18/22 1 each 1   aspirin EC 81 MG tablet Take 1 tablet (81 mg total) by mouth daily. Swallow whole. 30 tablet 1   benztropine (COGENTIN) 1 MG tablet Take 1 tablet (1 mg total) by mouth daily. 30 tablet 1   fenofibrate 54 MG tablet Take 1 tablet (54 mg total) by mouth daily. 30 tablet 1   gemfibrozil (LOPID) 600 MG tablet Take 1 tablet (600 mg total) by mouth 2 (two) times daily. 60 tablet 1   levothyroxine (SYNTHROID) 88 MCG tablet Take 1 tablet (88 mcg total) by mouth daily. 30 tablet 1   metFORMIN (GLUCOPHAGE) 1000 MG tablet Take 1 tablet (1,000 mg total) by mouth 2 (two) times daily with a meal. 60 tablet 1   pioglitazone (ACTOS) 45 MG tablet Take 1 tablet (45 mg total) by mouth daily. 30 tablet 1   simvastatin (ZOCOR) 20 MG tablet Take 1 tablet (20 mg total) by mouth at bedtime. 30 tablet 1   temazepam (RESTORIL) 15 MG capsule Take 1 capsule (15 mg total) by mouth at bedtime. 30 capsule 1    Musculoskeletal: Strength & Muscle Tone: within normal limits Gait & Station: normal Patient leans: N/A  Psychiatric Specialty  Exam: Physical Exam Vitals and nursing note reviewed.  Constitutional:      Appearance: Normal appearance.  HENT:     Head: Normocephalic.     Nose: Nose normal.  Pulmonary:     Effort: Pulmonary effort is normal.  Musculoskeletal:        General: Normal range of motion.     Cervical back: Normal range of motion.  Neurological:     General: No focal deficit present.     Mental Status: He is alert and oriented to person, place, and time.  Psychiatric:        Attention and Perception: Attention and perception normal.        Mood and Affect: Mood is anxious. Affect is blunt.        Speech: Speech normal.        Behavior: Behavior is slowed. Behavior is cooperative.        Thought Content: Thought content normal.        Cognition and Memory: Cognition and memory normal.        Judgment: Judgment normal.     Review of Systems  Psychiatric/Behavioral:  The patient is nervous/anxious.   All other systems reviewed and are negative.   Blood pressure 137/79, pulse 89, temperature 99 F (37.2 C), temperature source Oral, resp. rate 16, height 6\' 4"  (1.93 m), weight 109.3 kg, SpO2 100 %.Body mass index is 29.34 kg/m.  General Appearance: Casual  Eye Contact:  Good  Speech:  Slow  Volume:  Normal  Mood:  Anxious  Affect:  Blunt  Thought Process:  Coherent  Orientation:  Full (Time, Place, and Person)  Thought Content:  WDL and Logical  Suicidal Thoughts:  No  Homicidal Thoughts:  No  Memory:  Immediate;   Good Recent;   Good Remote;   Good  Judgement:  Good  Insight:  Fair  Psychomotor Activity:  Normal  Concentration:  Concentration: Good and Attention Span: Good  Recall:  Good  Fund of Knowledge:  Good  Language:  Good  Akathisia:  No  Handed:  Right  AIMS (if indicated):     Assets:  Housing Leisure Time Physical Health Resilience Social Support  ADL's:  Intact  Cognition:  WNL  Sleep:        Physical Exam: Physical Exam Vitals and nursing note reviewed.   Constitutional:      Appearance: Normal appearance.  HENT:     Head: Normocephalic.     Nose: Nose normal.  Pulmonary:     Effort: Pulmonary effort is normal.  Musculoskeletal:        General: Normal range of motion.     Cervical back: Normal range of motion.  Neurological:     General: No focal deficit present.     Mental Status: He is alert and oriented to person, place, and time.  Psychiatric:        Attention and Perception: Attention and perception normal.        Mood and Affect: Mood is anxious. Affect is blunt.        Speech: Speech normal.        Behavior: Behavior is slowed. Behavior is cooperative.        Thought Content: Thought content normal.        Cognition and Memory: Cognition and memory normal.        Judgment: Judgment normal.    Review of Systems  Psychiatric/Behavioral:  The patient is nervous/anxious.   All other systems  reviewed and are negative.  Blood pressure 137/79, pulse 89, temperature 99 F (37.2 C), temperature source Oral, resp. rate 16, height 6\' 4"  (1.93 m), weight 109.3 kg, SpO2 100 %. Body mass index is 29.34 kg/m.  Treatment Plan Summary: Schizophrenia, paranoid type: Continue Abilify 400 mg injection monthly Follow up with [facility] team  EPS: Continue Cogentin 1 mg daily  Insomnia: Continue temazepam 15 mg daily  Disposition: No evidence of imminent risk to self or others at present.   Patient does not meet criteria for psychiatric inpatient admission.  , NP 02/26/2022 4:46 PM

## 2022-02-26 NOTE — ED Notes (Signed)
Pt states his house is too cold so he came back to the ED.

## 2022-02-26 NOTE — ED Provider Notes (Addendum)
[facility] Provider Note    Event Date/Time   First MD Initiated Contact with Patient 02/26/22 1458     (approximate)   History   Suicidal   HPI  Derrick Atkins is a 51 y.o. male   history of schizophrenia just discharged from facility presents to the ER for feeling claustrophobic while at home.  States he is having thoughts of harming himself but does not endorse a plan.  States he is very anxious living at home.  States his legal guardian is supposed to see him tomorrow but states that he feels like his house is too cold right now.      Physical Exam   Triage Vital Signs: ED Triage Vitals  Enc Vitals Group     BP 02/26/22 1439 137/79     Pulse Rate 02/26/22 1439 89     Resp 02/26/22 1439 16     Temp 02/26/22 1439 99 F (37.2 C)     Temp Source 02/26/22 1439 Oral     SpO2 02/26/22 1439 100 %     Weight 02/26/22 1441 241 lb (109.3 kg)     Height 02/26/22 1441 6\' 4"  (1.93 m)     Head Circumference --      Peak Flow --      Pain Score 02/26/22 1440 0     Pain Loc --      Pain Edu? --      Excl. in GC? --     Most recent vital signs: Vitals:   02/26/22 1439  BP: 137/79  Pulse: 89  Resp: 16  Temp: 99 F (37.2 C)  SpO2: 100%     Constitutional: Alert  Eyes: Conjunctivae are normal.  Head: Atraumatic. Nose: No congestion/rhinnorhea. Mouth/Throat: Mucous membranes are moist.   Neck: Painless ROM.  Cardiovascular:   Good peripheral circulation. Respiratory: Normal respiratory effort.  No retractions.  Gastrointestinal: Soft and nontender.  Musculoskeletal:  no deformity Neurologic:  MAE spontaneously. No gross focal neurologic deficits are appreciated.  Skin:  Skin is warm, dry and intact. No rash noted. Psychiatric: Mood and affect are anxious    ED Results / Procedures / Treatments   Labs (all labs ordered are listed, but only abnormal results are displayed) Labs Reviewed  SALICYLATE LEVEL - Abnormal; Notable for the  following components:      Result Value   Salicylate Lvl <7.0 (*)    All other components within normal limits  ACETAMINOPHEN LEVEL - Abnormal; Notable for the following components:   Acetaminophen (Tylenol), Serum <10 (*)    All other components within normal limits  RESP PANEL BY RT-PCR (FLU A&B, COVID) ARPGX2  COMPREHENSIVE METABOLIC PANEL  ETHANOL  CBC  URINE DRUG SCREEN, QUALITATIVE ([facility] ONLY)     EKG     RADIOLOGY    PROCEDURES:  Critical Care performed:   Procedures   MEDICATIONS ORDERED IN ED: Medications - No data to display   IMPRESSION / MDM / ASSESSMENT AND PLAN / ED COURSE  I reviewed the triage vital signs and the nursing notes.                              Differential diagnosis includes, but is not limited to, Psychosis, delirium, medication effect, noncompliance, polysubstance abuse, Si, Hi, depression  Patient here for evaluation of anxiety and SI.  Patient has psych history of schizophrenia.  Laboratory testing was ordered to  evaluation for underlying electrolyte derangement or signs of underlying organic pathology to explain today's presentation.  Based on history and physical and laboratory evaluation, it appears that the patient's presentation is 2/2 underlying psychiatric disorder and will require further evaluation and management by inpatient psychiatry.    The patient has been placed in psychiatric observation due to the need to provide a safe environment for the patient while obtaining psychiatric consultation and evaluation, as well as ongoing medical and medication management to treat the patient's condition.    The patient has been evaluated at bedside by psychiatry.  Patient is clinically stable.  Not felt to be a danger to self or others.  No SI or Hi.  No indication for inpatient psychiatric admission at this time.  Appropriate for continued outpatient therapy.      FINAL CLINICAL IMPRESSION(S) / ED DIAGNOSES   Final diagnoses:   Suicidal ideation     Rx / DC Orders   ED Discharge Orders     None        Note:  This document was prepared using Dragon voice recognition software and may include unintentional dictation errors.     Willy Eddy, MD 02/26/22 [phone]

## 2022-02-26 NOTE — ED Notes (Addendum)
Patient unable to sign for self; has legal guardian.  Legal Guardian, [name] is who arrived to pick him up but did not get out of the vehicle.  This RN walked out with patient; patient willingly got into vehicle with legal guardian at this time.

## 2022-02-26 NOTE — ED Notes (Signed)
Patient belongings provided to patient; advised that his ride is on the way and that he can change back in to personal clothes if he wishes.

## 2022-02-26 NOTE — ED Triage Notes (Signed)
Patient brought in by [employer] however officer left without speaking with any staff. Patient states he was just discharged and sent home last night but he is sad and having suicidal ideations. Denies any plan.

## 2022-02-26 NOTE — ED Notes (Signed)
Call placed to [name] (pt's guardian), call continues to go to voicemail. This Clinical research associate contacted pt's brother [name] who will call [name] to get her to call the ED back to figure out transport for the pt back home.

## 2022-02-26 NOTE — ED Notes (Signed)
Pt given dinner tray and drink at this time. 

## 2022-03-05 ENCOUNTER — Emergency Department
Admission: EM | Admit: 2022-03-05 | Discharge: 2022-03-05 | Disposition: A | Discharge status: Home / Self Care | Payer: Medicare Other | Attending: Emergency Medicine | Admitting: Emergency Medicine

## 2022-03-05 ENCOUNTER — Other Ambulatory Visit: Payer: Self-pay

## 2022-03-05 DIAGNOSIS — Z046 Encounter for general psychiatric examination, requested by authority: Secondary | ICD-10-CM | POA: Diagnosis present

## 2022-03-05 DIAGNOSIS — I10 Essential (primary) hypertension: Secondary | ICD-10-CM | POA: Diagnosis not present

## 2022-03-05 DIAGNOSIS — F1310 Sedative, hypnotic or anxiolytic abuse, uncomplicated: Secondary | ICD-10-CM | POA: Diagnosis not present

## 2022-03-05 DIAGNOSIS — E119 Type 2 diabetes mellitus without complications: Secondary | ICD-10-CM | POA: Insufficient documentation

## 2022-03-05 DIAGNOSIS — Z79899 Other long term (current) drug therapy: Secondary | ICD-10-CM | POA: Diagnosis not present

## 2022-03-05 DIAGNOSIS — E039 Hypothyroidism, unspecified: Secondary | ICD-10-CM | POA: Diagnosis not present

## 2022-03-05 DIAGNOSIS — Y900 Blood alcohol level of less than 20 mg/100 ml: Secondary | ICD-10-CM | POA: Insufficient documentation

## 2022-03-05 DIAGNOSIS — F419 Anxiety disorder, unspecified: Secondary | ICD-10-CM | POA: Insufficient documentation

## 2022-03-05 DIAGNOSIS — R45851 Suicidal ideations: Secondary | ICD-10-CM | POA: Diagnosis not present

## 2022-03-05 DIAGNOSIS — F3170 Bipolar disorder, currently in remission, most recent episode unspecified: Secondary | ICD-10-CM | POA: Diagnosis not present

## 2022-03-05 DIAGNOSIS — Z7982 Long term (current) use of aspirin: Secondary | ICD-10-CM | POA: Diagnosis not present

## 2022-03-05 DIAGNOSIS — Z7984 Long term (current) use of oral hypoglycemic drugs: Secondary | ICD-10-CM | POA: Diagnosis not present

## 2022-03-05 DIAGNOSIS — F200 Paranoid schizophrenia: Secondary | ICD-10-CM | POA: Insufficient documentation

## 2022-03-05 DIAGNOSIS — F319 Bipolar disorder, unspecified: Secondary | ICD-10-CM

## 2022-03-05 LAB — CBC
HCT: 41.3 % (ref 39.0–52.0)
Hemoglobin: 13.6 g/dL (ref 13.0–17.0)
MCH: 26.9 pg (ref 26.0–34.0)
MCHC: 32.9 g/dL (ref 30.0–36.0)
MCV: 81.6 fL (ref 80.0–100.0)
Platelets: 212 10*3/uL (ref 150–400)
RBC: 5.06 MIL/uL (ref 4.22–5.81)
RDW: 14.6 % (ref 11.5–15.5)
WBC: 6.1 10*3/uL (ref 4.0–10.5)
nRBC: 0 % (ref 0.0–0.2)

## 2022-03-05 LAB — COMPREHENSIVE METABOLIC PANEL
ALT: 31 U/L (ref 0–44)
AST: 40 U/L (ref 15–41)
Albumin: 4.6 g/dL (ref 3.5–5.0)
Alkaline Phosphatase: 63 U/L (ref 38–126)
Anion gap: 8 (ref 5–15)
BUN: 7 mg/dL (ref 6–20)
CO2: 26 mmol/L (ref 22–32)
Calcium: 9.7 mg/dL (ref 8.9–10.3)
Chloride: 105 mmol/L (ref 98–111)
Creatinine, Ser: 0.8 mg/dL (ref 0.61–1.24)
GFR, Estimated: >60 mL/min (ref >60)
Glucose, Bld: 116 mg/dL — ABNORMAL HIGH (ref 70–99)
Potassium: 4.1 mmol/L (ref 3.5–5.1)
Sodium: 139 mmol/L (ref 135–145)
Total Bilirubin: 1.2 mg/dL (ref 0.3–1.2)
Total Protein: 7.6 g/dL (ref 6.5–8.1)

## 2022-03-05 LAB — ACETAMINOPHEN LEVEL: Acetaminophen (Tylenol), Serum: <10 ug/mL — ABNORMAL LOW (ref 10–30)

## 2022-03-05 LAB — URINE DRUG SCREEN, QUALITATIVE (ARMC ONLY)
Amphetamines, Ur Screen: NOT DETECTED
Barbiturates, Ur Screen: NOT DETECTED
Benzodiazepine, Ur Scrn: POSITIVE — AB
Cannabinoid 50 Ng, Ur ~~LOC~~: NOT DETECTED
Cocaine Metabolite,Ur ~~LOC~~: NOT DETECTED
MDMA (Ecstasy)Ur Screen: NOT DETECTED
Methadone Scn, Ur: NOT DETECTED
Opiate, Ur Screen: NOT DETECTED
Phencyclidine (PCP) Ur S: NOT DETECTED
Tricyclic, Ur Screen: NOT DETECTED

## 2022-03-05 LAB — SALICYLATE LEVEL: Salicylate Lvl: <7 mg/dL — ABNORMAL LOW (ref 7.0–30.0)

## 2022-03-05 NOTE — BH Assessment (Addendum)
Comprehensive Clinical Assessment (CCA) Screening, Triage and Referral Note  03/05/2022 CORTLAN DOLIN [id number]  Chief Complaint:  Chief Complaint  Patient presents with   Suicidal    Per police with pt, pt not IVC'ed   Visit Diagnosis: Schizophrenia, paranoid, chronic   Davon Abdelaziz. Paullin is a 51 year old male who presents to the ER because he was upset about not locating a group home. He states he currently lives alone and doesn't have much food. Patient denies SI/HI and AV/H. After speaking with the patient and him eating, he was requesting to return home. Patient also denies the use of mind-alternating substances.   Patient Reported Information How did you hear about [place]? Self  What Is the Reason for Your Visit/Call Today? Upset because he couldn't find a group home.  How Long Has This Been Causing You Problems? <Week  What Do You Feel Would Help You the Most Today? Treatment for Depression or other mood problem   Have You Recently Had Any Thoughts About Hurting Yourself? No  Are You Planning to Commit Suicide/Harm Yourself At This time? No   Have you Recently Had Thoughts About Hurting Someone [name]? No  Are You Planning to Harm Someone at This Time? No  Explanation: No data recorded  Have You Used Any Alcohol or Drugs in the Past 24 Hours? No  How Long Ago Did You Use Drugs or Alcohol? No data recorded What Did You Use and How Much? No data recorded  Do You Currently Have a Therapist/Psychiatrist? No  Name of Therapist/Psychiatrist: Unknown   Have You Been Recently Discharged From Any Office Practice or Programs? No  Explanation of Discharge From Practice/Program: No data recorded   CCA Screening Triage Referral Assessment Type of Contact: Face-to-Face  Telemedicine Service Delivery:   Is this Initial or Reassessment? No data recorded Date Telepsych consult ordered in [facility]:  No data recorded Time Telepsych consult ordered in [facility]:  No data  recorded Location of Assessment: [facility] ED  Provider Location: [facility] ED   Collateral Involvement: Yann Biehn- brother [phone]   Does Patient Have a Court Appointed Legal Guardian? No data recorded Name and Contact of Legal Guardian: No data recorded If Minor and Not Living with Parent(s), Who has Custody? No data recorded Is CPS involved or ever been involved? Never  Is APS involved or ever been involved? Never   Patient Determined To Be At Risk for Harm To Self or Others Based on Review of Patient Reported Information or Presenting Complaint? No  Method: No data recorded Availability of Means: No data recorded Intent: No data recorded Notification Required: No data recorded Additional Information for Danger to Others Potential: No data recorded Additional Comments for Danger to Others Potential: No data recorded Are There Guns or Other Weapons in Your Home? No data recorded Types of Guns/Weapons: No data recorded Are These Weapons Safely Secured?                            No data recorded Who Could Verify You Are Able To Have These Secured: No data recorded Do You Have any Outstanding Charges, Pending Court Dates, Parole/Probation? No data recorded Contacted To Inform of Risk of Harm To Self or Others: No data recorded  Does Patient Present under Involuntary Commitment? No  IVC Papers Initial File Date: 02/02/22   [place] of Residence: [place]   Patient Currently Receiving the Following Services: Medication Management   Determination of Need:  Emergent (2 hours)   Options For Referral: ED Visit   Discharge Disposition:     Lilyan Gilford MS, LCAS, [facility], [facility] Therapeutic Triage Specialist 03/05/2022 6:20 PM

## 2022-03-05 NOTE — ED Notes (Signed)
Left HIPPA compliant message for [name], patient's legal guardian, to call back.

## 2022-03-05 NOTE — ED Notes (Signed)
 PD Officer Chestine Spore here to transport patient home.

## 2022-03-05 NOTE — ED Notes (Signed)
This RN attempted to call legal guardian to let her know of pt's status and plan of care, no answer and this RN did not leave a message.

## 2022-03-05 NOTE — ED Notes (Signed)
Patient's guardian returned this nurse's call.  Derrick Atkins states that she refuses to come pick up the patient because he made threats to her earlier today.

## 2022-03-05 NOTE — ED Notes (Signed)
Hospital meal provided, pt tolerated w/o complaints.  Waste discarded appropriately.  

## 2022-03-05 NOTE — ED Notes (Addendum)
Derrick Atkins of [facility] [employer] returned called.  Explained that legal guardian was refusing to pick patient up and that reason was patient had threatened her earlier today, also explained that patient does not live with guardian and was trying to clarify that it was okay to discharge patient if transportation could be found.  Ms Andrey Campanile explained that she would talk to her manager and one of them would call this RN back.

## 2022-03-05 NOTE — ED Notes (Signed)
Attempted to call legal guardian. No answer. Left HIPPA compliant voice message and callback number.

## 2022-03-05 NOTE — ED Triage Notes (Signed)
Pt presents to ED with [employer]. [employer] states pt has been isolated for 2 days and has not been taking his meds and is having SI, when pt is asked if he has a plan, pt just keeps speaking under his breath and not making any sense.

## 2022-03-05 NOTE — ED Notes (Signed)
Spoke with Irving Shows of [facility] [employer].  Since guardian was notified patient would be discharged if transportation can be arranged and that they would open a case since she refused to come and get him.

## 2022-03-05 NOTE — Consult Note (Signed)
[facility] Face-to-Face Psychiatry Consult   Reason for Consult:  suicidal ideations Referring Physician:  EDP Patient Identification: Derrick Atkins MRN:  [id number] Principal Diagnosis: Schizophrenia, paranoid, chronic (HCC) Diagnosis:  Principal Problem:   Schizophrenia, paranoid, chronic (HCC)   Total Time spent with patient: 45 minutes  Subjective:   Derrick Atkins is a 51 y.o. male patient admitted with suicidal ideations.  HPI:  51 yo male with schizoaffective disorder who came to the ED with suicidal ideations. On assessment, he voices he wants a group home because he is isolated in his place as he cannot drive.  Denies suicidal/homicidal ideations, hallucinations, and substance abuse.  Eating heartily on his bed watching television.  He is frustrated with his legal guardian as he wants a group home.  No other concerns, feels safe returning home.  Caveat:  He reports missing his medications related to waiting for pharmacy delivery.  Past Psychiatric History: schizoaffective d/o  Risk to Self:  none Risk to Others:  none Prior Inpatient Therapy:  multiple Prior Outpatient Therapy:  yes  Past Medical History:  Past Medical History:  Diagnosis Date   Cerebral palsy (HCC)    Depression    Diabetes mellitus without complication (HCC)    Hypertension    Kidney stones    Schizoaffective disorder (HCC)    Scoliosis     Past Surgical History:  Procedure Laterality Date   BOWEL RESECTION     LAPAROTOMY N/A 05/15/2020   Procedure: EXPLORATORY LAPAROTOMY;  Surgeon: Henrene Dodge, MD;  Location: [facility] ORS;  Service: General;  Laterality: N/A;   Family History:  Family History  Problem Relation Age of Onset   Heart disease Father    Heart attack Father    Family Psychiatric  History: see above Social History:  Social History   Substance and Sexual Activity  Alcohol Use No     Social History   Substance and Sexual Activity  Drug Use No    Social History    Socioeconomic History   Marital status: Single    Spouse name: Not on file   Number of children: Not on file   Years of education: Not on file   Highest education level: Not on file  Occupational History   Not on file  Tobacco Use   Smoking status: Never   Smokeless tobacco: Never  Vaping Use   Vaping Use: Never used  Substance and Sexual Activity   Alcohol use: No   Drug use: No   Sexual activity: Not Currently  Other Topics Concern   Not on file  Social History Narrative   Not on file   Social Determinants of Health   Financial Resource Strain: Not on file  Food Insecurity: Not on file  Transportation Needs: Not on file  Physical Activity: Not on file  Stress: Not on file  Social Connections: Not on file   Additional Social History:    Allergies:   Allergies  Allergen Reactions   Phenytoin Sodium Extended Other (See Comments) and Nausea And Vomiting   Prednisone Other (See Comments)   Latex Hives, Nausea And Vomiting and Rash    Labs:  Results for orders placed or performed during the hospital encounter of 03/05/22 (from the past 48 hour(s))  Comprehensive metabolic panel     Status: Abnormal   Collection Time: 03/05/22  4:18 PM  Result Value Ref Range   Sodium 139 135 - 145 mmol/L   Potassium 4.1 3.5 - 5.1 mmol/L   Chloride  105 98 - 111 mmol/L   CO2 26 22 - 32 mmol/L   Glucose, Bld 116 (H) 70 - 99 mg/dL    Comment: Glucose reference range applies only to samples taken after fasting for at least 8 hours.   BUN 7 6 - 20 mg/dL   Creatinine, Ser 3.38 0.61 - 1.24 mg/dL   Calcium 9.7 8.9 - 32.9 mg/dL   Total Protein 7.6 6.5 - 8.1 g/dL   Albumin 4.6 3.5 - 5.0 g/dL   AST 40 15 - 41 U/L   ALT 31 0 - 44 U/L   Alkaline Phosphatase 63 38 - 126 U/L   Total Bilirubin 1.2 0.3 - 1.2 mg/dL   GFR, Estimated >19 >16 mL/min    Comment: (NOTE) Calculated using the CKD-EPI Creatinine Equation (2021)    Anion gap 8 5 - 15    Comment: Performed at [facility], [address] [place], [place] [address]  cbc     Status: None   Collection Time: 03/05/22  4:18 PM  Result Value Ref Range   WBC 6.1 4.0 - 10.5 K/uL   RBC 5.06 4.22 - 5.81 MIL/uL   Hemoglobin 13.6 13.0 - 17.0 g/dL   HCT 45.9 97.7 - 41.4 %   MCV 81.6 80.0 - 100.0 fL   MCH 26.9 26.0 - 34.0 pg   MCHC 32.9 30.0 - 36.0 g/dL   RDW 23.9 53.2 - 02.3 %   Platelets 212 150 - 400 K/uL   nRBC 0.0 0.0 - 0.2 %    Comment: Performed at [facility], [address] [place], [place] [address]    No current facility-administered medications for this encounter.   Current Outpatient Medications  Medication Sig Dispense Refill   [START ON 03/18/2022] ARIPiprazole ER (ABILIFY MAINTENA) 400 MG SRER injection Inject 2 mLs (400 mg total) into the muscle every 28 (twenty-eight) days. Next dose due 03/18/22 1 each 1   aspirin EC 81 MG tablet Take 1 tablet (81 mg total) by mouth daily. Swallow whole. 30 tablet 1   benztropine (COGENTIN) 1 MG tablet Take 1 tablet (1 mg total) by mouth daily. 30 tablet 1   fenofibrate 54 MG tablet Take 1 tablet (54 mg total) by mouth daily. 30 tablet 1   gemfibrozil (LOPID) 600 MG tablet Take 1 tablet (600 mg total) by mouth 2 (two) times daily. 60 tablet 1   levothyroxine (SYNTHROID) 88 MCG tablet Take 1 tablet (88 mcg total) by mouth daily. 30 tablet 1   metFORMIN (GLUCOPHAGE) 1000 MG tablet Take 1 tablet (1,000 mg total) by mouth 2 (two) times daily with a meal. 60 tablet 1   pioglitazone (ACTOS) 45 MG tablet Take 1 tablet (45 mg total) by mouth daily. 30 tablet 1   simvastatin (ZOCOR) 20 MG tablet Take 1 tablet (20 mg total) by mouth at bedtime. 30 tablet 1   temazepam (RESTORIL) 15 MG capsule Take 1 capsule (15 mg total) by mouth at bedtime. 30 capsule 1    Musculoskeletal: Strength & Muscle Tone: within normal limits Gait & Station: normal Patient leans: N/A  Psychiatric Specialty Exam: Physical Exam Vitals and nursing note reviewed.  Constitutional:       Appearance: Normal appearance.  HENT:     Head: Normocephalic.     Nose: Nose normal.  Pulmonary:     Effort: Pulmonary effort is normal.  Musculoskeletal:        General: Normal range of motion.  Cervical back: Normal range of motion.  Neurological:     General: No focal deficit present.     Mental Status: He is alert and oriented to person, place, and time.  Psychiatric:        Attention and Perception: Attention and perception normal.        Mood and Affect: Mood is anxious. Affect is flat.        Speech: Speech normal.        Behavior: Behavior normal. Behavior is cooperative.        Thought Content: Thought content normal.        Cognition and Memory: Cognition and memory normal.        Judgment: Judgment normal.     Review of Systems  Psychiatric/Behavioral:  The patient is nervous/anxious.   All other systems reviewed and are negative.   Blood pressure (!) 152/61, pulse 100, temperature 97.7 F (36.5 C), temperature source Oral, resp. rate 20, SpO2 100 %.There is no height or weight on file to calculate BMI.  General Appearance: Casual  Eye Contact:  Good  Speech:  Normal Rate  Volume:  Normal  Mood:  Anxious  Affect:  Flat  Thought Process:  Coherent  Orientation:  Full (Time, Place, and Person)  Thought Content:  WDL and Logical  Suicidal Thoughts:  No  Homicidal Thoughts:  No  Memory:  Immediate;   Good Recent;   Good Remote;   Good  Judgement:  Good  Insight:  Good  Psychomotor Activity:  Normal  Concentration:  Concentration: Good and Attention Span: Good  Recall:  Good  Fund of Knowledge:  Good  Language:  Good  Akathisia:  No  Handed:  Right  AIMS (if indicated):     Assets:  Housing Leisure Time Physical Health Resilience Social Support  ADL's:  Intact  Cognition:  WNL  Sleep:        Physical Exam: Physical Exam Vitals and nursing note reviewed.  Constitutional:      Appearance: Normal appearance.  HENT:     Head:  Normocephalic.     Nose: Nose normal.  Pulmonary:     Effort: Pulmonary effort is normal.  Musculoskeletal:        General: Normal range of motion.     Cervical back: Normal range of motion.  Neurological:     General: No focal deficit present.     Mental Status: He is alert and oriented to person, place, and time.  Psychiatric:        Attention and Perception: Attention and perception normal.        Mood and Affect: Mood is anxious. Affect is flat.        Speech: Speech normal.        Behavior: Behavior normal. Behavior is cooperative.        Thought Content: Thought content normal.        Cognition and Memory: Cognition and memory normal.        Judgment: Judgment normal.    Review of Systems  Psychiatric/Behavioral:  The patient is nervous/anxious.   All other systems reviewed and are negative.  Blood pressure (!) 152/61, pulse 100, temperature 97.7 F (36.5 C), temperature source Oral, resp. rate 20, SpO2 100 %. There is no height or weight on file to calculate BMI.  Treatment Plan Summary: Schizoaffective disorder, bipolar type: Follow up with outpatient provider, no medication changes  Disposition: No evidence of imminent risk to self or others at present.  Patient does not meet criteria for psychiatric inpatient admission. Supportive therapy provided about ongoing stressors.  Nanine Means, NP 03/05/2022 5:12 PM

## 2022-03-05 NOTE — ED Notes (Signed)
pt recieved snack and drink 

## 2022-03-05 NOTE — ED Notes (Signed)
Vol / pending discharge 

## 2022-03-05 NOTE — ED Notes (Signed)
Attempted to call guardian listed in chart. No answer.

## 2022-03-05 NOTE — ED Notes (Signed)
Attempted to call guardian again with no answer.

## 2022-03-05 NOTE — ED Provider Notes (Signed)
[facility] Provider Note   Event Date/Time   First MD Initiated Contact with Patient 03/05/22 1459     (approximate) History  Suicidal (Per police with pt, pt not IVC'ed)  HPI Derrick Atkins is a 51 y.o. male with past medical history of bipolar disorder who presents via law enforcement stating that he has been isolated for 2 days and not taking his medications as well as having suicidal ideation.  Law enforcement did not see fit to put this patient under involuntary commitment.  Patient states that he is "fine" and when asked if he has a plan for his suicidal ideation he mumbles under his breath and will not answer this question directly.  Patient currently denies any auditory/visual hallucinations, homicidal ideation ROS: Patient currently denies any vision changes, tinnitus, difficulty speaking, facial droop, sore throat, chest pain, shortness of breath, abdominal pain, nausea/vomiting/diarrhea, dysuria, or weakness/numbness/paresthesias in any extremity   Physical Exam  Triage Vital Signs: ED Triage Vitals  Enc Vitals Group     BP 03/05/22 1443 (!) 152/61     Pulse Rate 03/05/22 1443 100     Resp 03/05/22 1443 20     Temp 03/05/22 1443 97.7 F (36.5 C)     Temp Source 03/05/22 1443 Oral     SpO2 03/05/22 1443 100 %     Weight --      Height --      Head Circumference --      Peak Flow --      Pain Score 03/05/22 1444 0     Pain Loc --      Pain Edu? --      Excl. in GC? --    Most recent vital signs: Vitals:   03/05/22 1443  BP: (!) 152/61  Pulse: 100  Resp: 20  Temp: 97.7 F (36.5 C)  SpO2: 100%   General: Awake, oriented x4. CV:  Good peripheral perfusion.  Resp:  Normal effort.  Abd:  No distention.  Other:  Withdrawn middle-aged overweight Caucasian male laying in bed in no acute distress ED Results / Procedures / Treatments  Labs (all labs ordered are listed, but only abnormal results are displayed) Labs Reviewed  COMPREHENSIVE  METABOLIC PANEL - Abnormal; Notable for the following components:      Result Value   Glucose, Bld 116 (*)    All other components within normal limits  RESP PANEL BY RT-PCR (FLU A&B, COVID) ARPGX2  CBC  ETHANOL  SALICYLATE LEVEL  ACETAMINOPHEN LEVEL  URINE DRUG SCREEN, QUALITATIVE ([facility] ONLY)  PROCEDURES: Critical Care performed: No Procedures MEDICATIONS ORDERED IN ED: Medications - No data to display IMPRESSION / MDM / ASSESSMENT AND PLAN / ED COURSE  I reviewed the triage vital signs and the nursing notes.                             The patient is on the cardiac monitor to evaluate for evidence of arrhythmia and/or significant heart rate changes. Patient's presentation is most consistent with acute presentation with potential threat to life or bodily function. Thoughts are linear and organized, and patient has no AH, VH, or HI. Prior suicide attempt: Denies Prior Psychiatric Hospitalizations: Multiple  Clinically patient displays no overt toxidrome; they are well appearing, with low suspicion for toxic ingestion given history and exam. Thoughts unlikely 2/2 anemia, hypothyroidism, infection, or ICH.  Consult: Psychiatry to evaluate patient for potential hold  for danger to self. Discharge. Patient evaluated by both emergency and psychiatric professionals and deemed safe at this time for discharge. They have a plan for psychiatric follow up and a safe place to stay with access to physical and emotional support.    FINAL CLINICAL IMPRESSION(S) / ED DIAGNOSES   Final diagnoses:  Bipolar affective disorder, remission status unspecified (HCC)   Rx / DC Orders   ED Discharge Orders     None      Note:  This document was prepared using Dragon voice recognition software and may include unintentional dictation errors.   Merwyn Katos, MD 03/05/22 1710

## 2022-03-05 NOTE — ED Notes (Signed)
Pt belongings are as follows:   Recruitment consultant Black belt Black pair of sketcher sneakers 1 pair of white fruit of the loom underwear      Pt is cam and cooperative in triage

## 2022-03-06 LAB — ETHANOL: Alcohol, Ethyl (B): <10 mg/dL (ref <10)

## 2022-03-09 ENCOUNTER — Emergency Department
Admission: EM | Admit: 2022-03-09 | Discharge: 2022-03-11 | Disposition: A | Discharge status: Home / Self Care | Payer: Medicare Other | Attending: Emergency Medicine | Admitting: Emergency Medicine

## 2022-03-09 ENCOUNTER — Other Ambulatory Visit: Payer: Self-pay

## 2022-03-09 DIAGNOSIS — F22 Delusional disorders: Secondary | ICD-10-CM | POA: Diagnosis not present

## 2022-03-09 DIAGNOSIS — F200 Paranoid schizophrenia: Secondary | ICD-10-CM | POA: Diagnosis not present

## 2022-03-09 DIAGNOSIS — I10 Essential (primary) hypertension: Secondary | ICD-10-CM | POA: Insufficient documentation

## 2022-03-09 DIAGNOSIS — I4581 Long QT syndrome: Secondary | ICD-10-CM | POA: Insufficient documentation

## 2022-03-09 DIAGNOSIS — R451 Restlessness and agitation: Secondary | ICD-10-CM | POA: Diagnosis not present

## 2022-03-09 DIAGNOSIS — R9431 Abnormal electrocardiogram [ECG] [EKG]: Secondary | ICD-10-CM | POA: Diagnosis present

## 2022-03-09 DIAGNOSIS — E119 Type 2 diabetes mellitus without complications: Secondary | ICD-10-CM | POA: Diagnosis not present

## 2022-03-09 DIAGNOSIS — Z20822 Contact with and (suspected) exposure to covid-19: Secondary | ICD-10-CM | POA: Diagnosis not present

## 2022-03-09 DIAGNOSIS — Z046 Encounter for general psychiatric examination, requested by authority: Secondary | ICD-10-CM | POA: Diagnosis present

## 2022-03-09 DIAGNOSIS — E876 Hypokalemia: Secondary | ICD-10-CM | POA: Diagnosis not present

## 2022-03-09 LAB — CBC
HCT: 38.7 % — ABNORMAL LOW (ref 39.0–52.0)
Hemoglobin: 12.8 g/dL — ABNORMAL LOW (ref 13.0–17.0)
MCH: 26.4 pg (ref 26.0–34.0)
MCHC: 33.1 g/dL (ref 30.0–36.0)
MCV: 80 fL (ref 80.0–100.0)
Platelets: 188 10*3/uL (ref 150–400)
RBC: 4.84 MIL/uL (ref 4.22–5.81)
RDW: 14.5 % (ref 11.5–15.5)
WBC: 5.2 10*3/uL (ref 4.0–10.5)
nRBC: 0 % (ref 0.0–0.2)

## 2022-03-09 LAB — COMPREHENSIVE METABOLIC PANEL
ALT: 37 U/L (ref 0–44)
AST: 51 U/L — ABNORMAL HIGH (ref 15–41)
Albumin: 4.3 g/dL (ref 3.5–5.0)
Alkaline Phosphatase: 61 U/L (ref 38–126)
Anion gap: 8 (ref 5–15)
BUN: 10 mg/dL (ref 6–20)
CO2: 22 mmol/L (ref 22–32)
Calcium: 9.2 mg/dL (ref 8.9–10.3)
Chloride: 107 mmol/L (ref 98–111)
Creatinine, Ser: 0.77 mg/dL (ref 0.61–1.24)
GFR, Estimated: >60 mL/min (ref >60)
Glucose, Bld: 96 mg/dL (ref 70–99)
Potassium: 3.3 mmol/L — ABNORMAL LOW (ref 3.5–5.1)
Sodium: 137 mmol/L (ref 135–145)
Total Bilirubin: 1.8 mg/dL — ABNORMAL HIGH (ref 0.3–1.2)
Total Protein: 7 g/dL (ref 6.5–8.1)

## 2022-03-09 LAB — ETHANOL: Alcohol, Ethyl (B): <10 mg/dL (ref <10)

## 2022-03-09 LAB — RESP PANEL BY RT-PCR (FLU A&B, COVID) ARPGX2
Influenza A by PCR: NEGATIVE
Influenza B by PCR: NEGATIVE
SARS Coronavirus 2 by RT PCR: NEGATIVE

## 2022-03-09 LAB — SALICYLATE LEVEL: Salicylate Lvl: <7 mg/dL — ABNORMAL LOW (ref 7.0–30.0)

## 2022-03-09 LAB — TROPONIN I (HIGH SENSITIVITY)
Troponin I (High Sensitivity): 7 ng/L (ref <18)
Troponin I (High Sensitivity): 6 ng/L (ref <18)

## 2022-03-09 LAB — ACETAMINOPHEN LEVEL: Acetaminophen (Tylenol), Serum: <10 ug/mL — ABNORMAL LOW (ref 10–30)

## 2022-03-09 MED ORDER — GEMFIBROZIL 600 MG PO TABS: 600.0000 mg | ORAL_TABLET | Freq: Two times a day (BID) | ORAL | Status: DC

## 2022-03-09 MED ORDER — LORAZEPAM 1 MG PO TABS
1.0000 mg | ORAL_TABLET | Freq: Once | ORAL | Status: AC
  Administered 2022-03-09: 1 mg via ORAL

## 2022-03-09 MED ORDER — TEMAZEPAM 15 MG PO CAPS
15.0000 mg | ORAL_CAPSULE | Freq: Every day | ORAL | Status: DC
  Administered 2022-03-10: 15 mg via ORAL
  Filled 2022-03-09 (×2): qty 1

## 2022-03-09 MED ORDER — PIOGLITAZONE HCL 30 MG PO TABS
45.0000 mg | ORAL_TABLET | Freq: Every day | ORAL | Status: DC
  Administered 2022-03-10 – 2022-03-11 (×2): 45 mg via ORAL
  Filled 2022-03-09 (×2): qty 1

## 2022-03-09 MED ORDER — METFORMIN HCL 500 MG PO TABS
1000.0000 mg | ORAL_TABLET | Freq: Two times a day (BID) | ORAL | Status: DC
  Administered 2022-03-10 – 2022-03-11 (×3): 1000 mg via ORAL
  Filled 2022-03-09 (×3): qty 2

## 2022-03-09 MED ORDER — ZIPRASIDONE MESYLATE 20 MG IM SOLR
20.0000 mg | Freq: Once | INTRAMUSCULAR | Status: AC
Start: 2022-03-09 — End: 2022-03-09
  Administered 2022-03-09: 20 mg via INTRAMUSCULAR
  Filled 2022-03-09: qty 20

## 2022-03-09 MED ORDER — ASPIRIN 81 MG PO TBEC
81.0000 mg | DELAYED_RELEASE_TABLET | Freq: Every day | ORAL | Status: DC
  Administered 2022-03-10 – 2022-03-11 (×2): 81 mg via ORAL
  Filled 2022-03-09 (×2): qty 1

## 2022-03-09 MED ORDER — SIMVASTATIN 10 MG PO TABS
20.0000 mg | ORAL_TABLET | Freq: Every day | ORAL | Status: DC
  Administered 2022-03-10: 20 mg via ORAL
  Filled 2022-03-09 (×2): qty 2

## 2022-03-09 MED ORDER — LORAZEPAM 1 MG PO TABS
1.0000 mg | ORAL_TABLET | Freq: Once | ORAL | Status: AC
Start: 2022-03-09 — End: 2022-03-09
  Administered 2022-03-09: 1 mg via ORAL
  Filled 2022-03-09: qty 1

## 2022-03-09 MED ORDER — LISINOPRIL 10 MG PO TABS
10.0000 mg | ORAL_TABLET | Freq: Every day | ORAL | Status: DC
  Administered 2022-03-10 – 2022-03-11 (×2): 10 mg via ORAL
  Filled 2022-03-09 (×2): qty 1

## 2022-03-09 MED ORDER — FENOFIBRATE 54 MG PO TABS
54.0000 mg | ORAL_TABLET | Freq: Every day | ORAL | Status: DC
  Administered 2022-03-10 – 2022-03-11 (×2): 54 mg via ORAL
  Filled 2022-03-09 (×2): qty 1

## 2022-03-09 MED ORDER — LEVOTHYROXINE SODIUM 88 MCG PO TABS
88.0000 ug | ORAL_TABLET | Freq: Every day | ORAL | Status: DC
  Administered 2022-03-11: 88 ug via ORAL
  Filled 2022-03-09 (×2): qty 1

## 2022-03-09 MED ORDER — BENZTROPINE MESYLATE 1 MG PO TABS
1.0000 mg | ORAL_TABLET | Freq: Every day | ORAL | Status: DC
  Administered 2022-03-10 – 2022-03-11 (×2): 1 mg via ORAL
  Filled 2022-03-09 (×2): qty 1

## 2022-03-09 NOTE — ED Notes (Signed)
Attempted to contact legal guardian at this time, no answer voice mail left requesting call back

## 2022-03-09 NOTE — ED Notes (Signed)
Hospital meal provided, pt tolerated w/o complaints.  Waste discarded appropriately.  

## 2022-03-09 NOTE — Consult Note (Signed)
  1600: Patient was medicated for aggression. Asleep. Will need t o be assessed later.  Gabriel Cirri, PMHNP

## 2022-03-09 NOTE — ED Triage Notes (Signed)
PD was called for a person with no shirt on yelling and throwing things. Pt was kind to PD and was frustrated was his situation. Pt has a diagnosis of schizophrenia. Pt has not been taking medication because he ran out. Pt states the [employer] are taking his money for things he doesn't need so he has none left for food. Pd states he had no food in the home so they bought him a snack and pt has been happy since.

## 2022-03-09 NOTE — ED Notes (Signed)
Pt  vol  pending  consult 

## 2022-03-09 NOTE — ED Notes (Signed)
Pt given a turkey sandwich tray and a cup of sprite at this time.  

## 2022-03-09 NOTE — ED Notes (Signed)
Pt has repeatedly been asked to provide urine sample, will not. Pt was provided with crackers after claiming he was never given a snack tonight. This was in fact not true after checking chart. Pt educated no more food will be provided tonight until breakfast.

## 2022-03-09 NOTE — ED Notes (Signed)
PT VOL PENDING CONSULT/ NEEDS TO BE ASSESSED.Marland Kitchen

## 2022-03-09 NOTE — ED Notes (Signed)
Report received from Annie, RN including SBAR. Patient alert and oriented, warm and dry, and in no acute distress. Patient denies SI, HI, AVH and pain. Patient made aware of Q15 minute rounds and Rover and Officer presence for their safety. Patient instructed to come to this nurse with needs or concerns.  

## 2022-03-09 NOTE — ED Provider Notes (Signed)
[facility] Provider Note    Event Date/Time   First MD Initiated Contact with Patient 03/09/22 1218     (approximate)   History   Psychiatric Evaluation   HPI  Derrick Atkins is a 51 y.o. male   past medical history of bipolar disorder schizophrenia presents today by PD for agitation.  Per the triage note neighbors called the police because the patient had no shirt on and was yelling and throwing things.  They found him without any food in his house gave him a snack and he has been calm since.  Patient tells me that his legal guardian does not care for him he has not eaten has no food in his house.  Says there are maggots and spiders.  He is concerned about his neighbors and says that his neighbors have been very aggressive toward him.  Denies any physical violence.  Patient says he does not have any of his medications.  Denies suicidality.     Past Medical History:  Diagnosis Date   Cerebral palsy (HCC)    Depression    Diabetes mellitus without complication (HCC)    Hypertension    Kidney stones    Schizoaffective disorder [facility])    Scoliosis     Patient Active Problem List   Diagnosis Date Noted   Schizophrenia, paranoid type (HCC) 02/05/2022   Schizophrenia, paranoid, chronic (HCC)    Long QT interval 04/16/2014   Type 2 diabetes mellitus without complications (HCC) 05/03/2007     Physical Exam  Triage Vital Signs: ED Triage Vitals  Enc Vitals Group     BP 03/09/22 1152 (!) 142/76     Pulse Rate 03/09/22 1152 98     Resp 03/09/22 1152 17     Temp 03/09/22 1152 98.3 F (36.8 C)     Temp Source 03/09/22 1152 Oral     SpO2 03/09/22 1152 100 %     Weight 03/09/22 1146 240 lb 4.8 oz (109 kg)     Height 03/09/22 1146 6\' 4"  (1.93 m)     Head Circumference --      Peak Flow --      Pain Score 03/09/22 1145 0     Pain Loc --      Pain Edu? --      Excl. in GC? --     Most recent vital signs: Vitals:   03/09/22 1152  BP: (!) 142/76   Pulse: 98  Resp: 17  Temp: 98.3 F (36.8 C)  SpO2: 100%     General: Awake, no distress.  CV:  Good peripheral perfusion.  Resp:  Normal effort.  Abd:  No distention. Neuro:             Awake, Alert, Oriented x 3  Other:  Patient has flat affect, mildly paranoid, no SI   ED Results / Procedures / Treatments  Labs (all labs ordered are listed, but only abnormal results are displayed) Labs Reviewed  COMPREHENSIVE METABOLIC PANEL - Abnormal; Notable for the following components:      Result Value   Potassium 3.3 (*)    AST 51 (*)    Total Bilirubin 1.8 (*)    All other components within normal limits  SALICYLATE LEVEL - Abnormal; Notable for the following components:   Salicylate Lvl <7.0 (*)    All other components within normal limits  ACETAMINOPHEN LEVEL - Abnormal; Notable for the following components:   Acetaminophen (Tylenol), Serum <10 (*)  All other components within normal limits  CBC - Abnormal; Notable for the following components:   Hemoglobin 12.8 (*)    HCT 38.7 (*)    All other components within normal limits  ETHANOL  URINE DRUG SCREEN, QUALITATIVE ([facility] ONLY)     EKG     RADIOLOGY    PROCEDURES:  Critical Care performed: No  Procedures   MEDICATIONS ORDERED IN ED: Medications - No data to display   IMPRESSION / MDM / ASSESSMENT AND PLAN / ED COURSE  I reviewed the triage vital signs and the nursing notes.                              Patient's presentation is most consistent with exacerbation of chronic illness.  Differential diagnosis includes, but is not limited to, decompensated schizophrenia, medication noncompliance, substance-induced psychosis  Patient is a 52 year old male who presents because of agitation and inability to care for self.  He is currently living by himself but has a legal guardian.  PD was called out because he was yelling and throwing things and was not wearing a shirt.  Patient is calm and cooperative on  my assessment he tells me he has no food his legal guardian does not take care of him and he is scared of his neighbors who have been aggressive towards him.  Patient appears somewhat paranoid.  He was seen 4 days ago in the ED for Seidel ideation and feeling isolated was seen by psychiatry and cleared for discharge.  Patient's vitals are reassuring labs show mild hypokalemia with potassium 3.3.  Tylenol ethanol and salicylate levels are negative.  Plan to consult psychiatry.  Seems like social issues may be at the heart of his presentations to the ED.  Has not been placed in a group home in the past.  Patient is here voluntarily no indication for IVC at this time.  The patient has been placed in psychiatric observation due to the need to provide a safe environment for the patient while obtaining psychiatric consultation and evaluation, as well as ongoing medical and medication management to treat the patient's condition.  The patient has not been placed under full IVC at this time.        FINAL CLINICAL IMPRESSION(S) / ED DIAGNOSES   Final diagnoses:  Paranoia (HCC)     Rx / DC Orders   ED Discharge Orders     None        Note:  This document was prepared using Dragon voice recognition software and may include unintentional dictation errors.   Georga Hacking, MD 03/09/22 [phone]

## 2022-03-09 NOTE — ED Notes (Addendum)
Patient is being dressed out by their Clinical research associate and EDT Jonny Ruiz, patient is provided with hospital attire scrubs and belonging bag that is labeled with patient sticker and green sticker. Bag 1 of 1.   Belongings  Pair of slides Gray socks Black sweat pants Red long sleeve shirt Green lanyard keys  Fruit of loom underwear Bag of dorito chips Bottle water
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
## 2022-03-09 NOTE — ED Notes (Signed)
Patient sleeping at this time.
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
## 2022-03-09 NOTE — BH Assessment (Signed)
Patient given medication for aggression. Unable to assess.

## 2022-03-10 DIAGNOSIS — F200 Paranoid schizophrenia: Secondary | ICD-10-CM | POA: Diagnosis not present

## 2022-03-10 MED ORDER — OLANZAPINE 5 MG PO TBDP
10.0000 mg | ORAL_TABLET | Freq: Once | ORAL | Status: AC
  Administered 2022-03-10: 10 mg via ORAL

## 2022-03-10 MED ORDER — LORAZEPAM 2 MG/ML IJ SOLN
2.0000 mg | Freq: Once | INTRAMUSCULAR | Status: AC
  Administered 2022-03-10: 2 mg via INTRAMUSCULAR
  Filled 2022-03-10: qty 1

## 2022-03-10 MED ORDER — GEMFIBROZIL 600 MG PO TABS
600.0000 mg | ORAL_TABLET | Freq: Two times a day (BID) | ORAL | Status: DC
  Administered 2022-03-10: 600 mg via ORAL
  Filled 2022-03-10: qty 1

## 2022-03-10 MED ORDER — ZIPRASIDONE MESYLATE 20 MG IM SOLR
20.0000 mg | Freq: Once | INTRAMUSCULAR | Status: AC
  Administered 2022-03-10: 20 mg via INTRAMUSCULAR
  Filled 2022-03-10: qty 20

## 2022-03-10 MED ORDER — FENOFIBRATE 54 MG PO TABS
54.0000 mg | ORAL_TABLET | Freq: Every day | ORAL | Status: DC
  Filled 2022-03-10: qty 1

## 2022-03-10 NOTE — ED Notes (Signed)
Pt requested shower; provided clean hospital clothing and linens.  Shower setup provided with soap, shampoo, toothbrush/toothpaste, and deodorant.  Pt able to preform own ADL's with no assistance.    

## 2022-03-10 NOTE — ED Notes (Signed)
Vol /psych consult complete 

## 2022-03-10 NOTE — ED Notes (Addendum)
Call from [employer] with [employer] and informed of guardian's request. He then requested guardian's phone number and name.
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
## 2022-03-10 NOTE — ED Notes (Signed)
Pt stepped out of room. Apologized to nurse, pt is provided education about safety and rules of ED. Pt apologized to nurse and again, hurries into room stating he is not sorry to the officer. Once in room he shuts door and starts to yell profanity toward officer that can be heard in hallway. Will allow pt to self deescalate
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
## 2022-03-10 NOTE — ED Notes (Signed)
Given lunch

## 2022-03-10 NOTE — ED Notes (Signed)
RN contacted [employer] as instructed by guardian to attempt to get pt ride home via police, however pt brought in voluntarily. Awaiting phone call back.
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
## 2022-03-10 NOTE — ED Notes (Signed)
Breakfast given.  

## 2022-03-10 NOTE — ED Notes (Signed)
Patient laying in bed naked that was soaked with urine. Urine in the floor. ED tech went in to get vitals and patient refused and cursing at ED tech.  Security went to talk to patient, patient then starts screaming "fuck you too." Patient continues with aggressive behavoirs. EDP made aware of behaviors.

## 2022-03-10 NOTE — Consult Note (Signed)
Client does not like where he is living and does not want to return.  Explained that he is not meeting criteria for hospitalization and he would need to work with his guardian for a new place to move.  "I don't want to go back home" were his exact words.  After politely explaining the process of him utilizing his guardian and [name] vs the ED. He responded with "Ok, just send me home."  Psych cleared to return home.  Nanine Means, PMHNP
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
## 2022-03-10 NOTE — ED Notes (Addendum)
[name] Legal Guardian [phone]   [phone] Legal Guardian  Called and updated that police are not able to pick up patient. Legal guardian unable to set up transportation up for patient via ACT team or taxi as she is not able to pay for it. Guardian approves for patient to be taken back to his apartment via police, ACT team or taxi; but guardian unwilling to assist with discharge planing at this time.   RN then called ACT team herself at [phone] and informed of situation. ACT team staff state that they will pick up patient, unable to give specific time.

## 2022-03-10 NOTE — ED Notes (Signed)
Pt had hit self in head, witnessed by [employer], Idalia Needle. This nurse checks on pt and he reported he was in the room for too long and needed fresh air. Pt educated on safe behaviors, does not appear to be receptive

## 2022-03-10 NOTE — ED Notes (Signed)
Pt was standing outside of his rm hitting himself. RN Erie Noe asked pt was wrong. Pt requested to get temp check. Pt's temp was 97.5

## 2022-03-10 NOTE — ED Notes (Signed)
This RN attempted to contact social work for update- no answer

## 2022-03-11 DIAGNOSIS — F200 Paranoid schizophrenia: Secondary | ICD-10-CM | POA: Diagnosis not present

## 2022-03-11 DIAGNOSIS — F22 Delusional disorders: Secondary | ICD-10-CM

## 2022-03-11 NOTE — ED Notes (Signed)
Belongings bag 1/1 returned to patient at time of discharge 

## 2022-03-11 NOTE — Consult Note (Signed)
Patient seen face-to-face, standing in doorway of his room. He is calm, not combative with this provider. He states he is here because he "doesn't want to go back to group home." Patient advised that he does not meet criteria to be hospitalized and needs to return home and talk to his guardian regarding living situation. Per nursing, the guardian and group home have not been returning calls.   Patient does not meet criteria for involuntary commitment. IVC released by this provider.   [name], PMHNP
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
## 2022-03-11 NOTE — ED Notes (Signed)
Pt came out of his rm. When asked what he needed pt mumbled when security asked what he needed he mumbled again and said never mind

## 2022-03-11 NOTE — ED Notes (Signed)
Received hospital breakfast.  

## 2022-03-11 NOTE — ED Provider Notes (Signed)
Emergency Medicine Observation Re-evaluation Note  Derrick Atkins is a 51 y.o. male, seen on rounds today.  Pt initially presented to the ED for complaints of Psychiatric Evaluation Currently, the patient is resting, voices no medical complaints.  Physical Exam  BP (!) 148/74   Pulse 87   Temp (!) 97.4 F (36.3 C) (Oral)   Resp 16   Ht 6\' 4"  (1.93 m)   Wt 109 kg   SpO2 98%   BMI 29.25 kg/m  Physical Exam General: Resting in no acute distress Cardiac: No cyanosis Lungs: Equal rise and fall Psych: Not agitated  ED Course / MDM  EKG:EKG Interpretation  Date/Time:  Tuesday March 09 2022 13:46:13 EDT Ventricular Rate:  85 PR Interval:  130 QRS Duration: 90 QT Interval:  418 QTC Calculation: 497 R Axis:   38 Text Interpretation: Normal sinus rhythm with sinus arrhythmia Low voltage QRS T wave abnormality, consider lateral ischemia Prolonged QT Abnormal ECG When compared with ECG of 18-Jan-2022 13:42, Premature supraventricular complexes are no longer Present Confirmed by UNCONFIRMED, DOCTOR (20-Jan-2022), editor [id number] [id number]) on 03/09/2022 1:58:36 PM  I have reviewed the labs performed to date as well as medications administered while in observation.  Recent changes in the last 24 hours include no events overnight.  Plan  Current plan is for psychiatric disposition.    03/11/2022, MD 03/11/22 [phone]

## 2022-03-11 NOTE — TOC Progression Note (Signed)
Transition of Care [facility]) - Progression Note    Patient Details  Name: Derrick Atkins MRN: [id number] Date of Birth: Dec 01, 1970  [facility]) CM/SW Contact  Allayne Butcher, RN Phone Number: 03/11/2022, 12:42 PM  Clinical Narrative:     APS report given to Trinna Post at [facility].    [facility] has been arranged to pick patient up, they will call the ED nurse when they arrive at the ED.        Expected Discharge Plan and Services                                                 Social Determinants of Health (SDOH) Interventions    Readmission Risk Interventions     No data to display

## 2022-03-11 NOTE — ED Notes (Signed)
Pt threw orange juice on this RN and water onto floor. Remaining cups taken away from patient at this time.

## 2022-03-11 NOTE — ED Notes (Signed)
Case manager Charisse March, RN attempted to contact legal guardian, no answer. Voice mail left. Safe transport arranged for patient at this time. Awaiting arrival

## 2022-03-11 NOTE — ED Notes (Signed)
Pt given towel to clean up the floor where he threw his orange juice, pt slapped towel out of this Rns hand and refused to clean up mess.

## 2022-03-11 NOTE — ED Notes (Signed)
Pt had urinated the bed, bed sheet was changed and was given new blankets and new pants

## 2022-03-11 NOTE — ED Notes (Signed)
Pt given lunch tray and drink at this time. 

## 2022-03-11 NOTE — ED Notes (Signed)
E-signature not working at this time. Pt verbalized understanding of D/C instructions, prescriptions and follow up care with no further questions at this time. Pt in NAD and ambulatory at time of D/C. Belongings bag 1/1 returned to patient at time of departure. Unable to get into contact with legal guardian to go over discharge instruction

## 2022-03-12 ENCOUNTER — Other Ambulatory Visit: Payer: Self-pay

## 2022-03-12 ENCOUNTER — Emergency Department
Admission: EM | Admit: 2022-03-12 | Discharge: 2022-03-13 | Disposition: A | Discharge status: Home / Self Care | Payer: Medicare Other | Attending: Emergency Medicine | Admitting: Emergency Medicine

## 2022-03-12 DIAGNOSIS — Z20822 Contact with and (suspected) exposure to covid-19: Secondary | ICD-10-CM | POA: Diagnosis not present

## 2022-03-12 DIAGNOSIS — R4689 Other symptoms and signs involving appearance and behavior: Secondary | ICD-10-CM | POA: Diagnosis not present

## 2022-03-12 DIAGNOSIS — F200 Paranoid schizophrenia: Secondary | ICD-10-CM | POA: Insufficient documentation

## 2022-03-12 DIAGNOSIS — U071 COVID-19: Secondary | ICD-10-CM

## 2022-03-12 LAB — CBC WITH DIFFERENTIAL/PLATELET
Abs Immature Granulocytes: 0.01 10*3/uL (ref 0.00–0.07)
Basophils Absolute: 0 10*3/uL (ref 0.0–0.1)
Basophils Relative: 1 %
Eosinophils Absolute: 0.2 10*3/uL (ref 0.0–0.5)
Eosinophils Relative: 5 %
HCT: 36.5 % — ABNORMAL LOW (ref 39.0–52.0)
Hemoglobin: 12.3 g/dL — ABNORMAL LOW (ref 13.0–17.0)
Immature Granulocytes: 0 %
Lymphocytes Relative: 24 %
Lymphs Abs: 1.2 10*3/uL (ref 0.7–4.0)
MCH: 27 pg (ref 26.0–34.0)
MCHC: 33.7 g/dL (ref 30.0–36.0)
MCV: 80 fL (ref 80.0–100.0)
Monocytes Absolute: 0.3 10*3/uL (ref 0.1–1.0)
Monocytes Relative: 6 %
Neutro Abs: 3.2 10*3/uL (ref 1.7–7.7)
Neutrophils Relative %: 64 %
Platelets: 198 10*3/uL (ref 150–400)
RBC: 4.56 MIL/uL (ref 4.22–5.81)
RDW: 14.9 % (ref 11.5–15.5)
WBC: 4.9 10*3/uL (ref 4.0–10.5)
nRBC: 0 % (ref 0.0–0.2)

## 2022-03-12 LAB — URINALYSIS, ROUTINE W REFLEX MICROSCOPIC
Bilirubin Urine: NEGATIVE
Glucose, UA: NEGATIVE mg/dL
Hgb urine dipstick: NEGATIVE
Ketones, ur: NEGATIVE mg/dL
Leukocytes,Ua: NEGATIVE
Nitrite: NEGATIVE
Protein, ur: NEGATIVE mg/dL
Specific Gravity, Urine: 1.006 (ref 1.005–1.030)
pH: 5 (ref 5.0–8.0)

## 2022-03-12 LAB — URINE DRUG SCREEN, QUALITATIVE (ARMC ONLY)
Amphetamines, Ur Screen: NOT DETECTED
Barbiturates, Ur Screen: NOT DETECTED
Benzodiazepine, Ur Scrn: POSITIVE — AB
Cannabinoid 50 Ng, Ur ~~LOC~~: NOT DETECTED
Cocaine Metabolite,Ur ~~LOC~~: NOT DETECTED
MDMA (Ecstasy)Ur Screen: NOT DETECTED
Methadone Scn, Ur: NOT DETECTED
Opiate, Ur Screen: NOT DETECTED
Phencyclidine (PCP) Ur S: NOT DETECTED
Tricyclic, Ur Screen: NOT DETECTED

## 2022-03-12 LAB — COMPREHENSIVE METABOLIC PANEL
ALT: 35 U/L (ref 0–44)
AST: 41 U/L (ref 15–41)
Albumin: 3.9 g/dL (ref 3.5–5.0)
Alkaline Phosphatase: 71 U/L (ref 38–126)
Anion gap: 6 (ref 5–15)
BUN: 8 mg/dL (ref 6–20)
CO2: 22 mmol/L (ref 22–32)
Calcium: 9 mg/dL (ref 8.9–10.3)
Chloride: 112 mmol/L — ABNORMAL HIGH (ref 98–111)
Creatinine, Ser: 0.77 mg/dL (ref 0.61–1.24)
GFR, Estimated: >60 mL/min (ref >60)
Glucose, Bld: 139 mg/dL — ABNORMAL HIGH (ref 70–99)
Potassium: 3.7 mmol/L (ref 3.5–5.1)
Sodium: 140 mmol/L (ref 135–145)
Total Bilirubin: 0.5 mg/dL (ref 0.3–1.2)
Total Protein: 6.5 g/dL (ref 6.5–8.1)

## 2022-03-12 LAB — RESP PANEL BY RT-PCR (FLU A&B, COVID) ARPGX2
Influenza A by PCR: NEGATIVE
Influenza B by PCR: NEGATIVE
SARS Coronavirus 2 by RT PCR: POSITIVE — AB

## 2022-03-12 LAB — ACETAMINOPHEN LEVEL: Acetaminophen (Tylenol), Serum: <10 ug/mL — ABNORMAL LOW (ref 10–30)

## 2022-03-12 LAB — SALICYLATE LEVEL: Salicylate Lvl: <7 mg/dL — ABNORMAL LOW (ref 7.0–30.0)

## 2022-03-12 LAB — ETHANOL: Alcohol, Ethyl (B): <10 mg/dL (ref <10)

## 2022-03-12 NOTE — ED Notes (Addendum)
Pt belongings:   2 socks 2 black shoes Beige sweater Pair of black sweat pants  1 pair of white underwear 1 green lanyard with keys 1 blister pack of meds

## 2022-03-12 NOTE — ED Notes (Signed)
IVC, pend consult 
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
## 2022-03-12 NOTE — BH Assessment (Signed)
Could not complete TTS Consult, patient would not speak with Clinical research associate.
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
## 2022-03-13 DIAGNOSIS — F200 Paranoid schizophrenia: Secondary | ICD-10-CM | POA: Diagnosis not present

## 2022-03-13 MED ORDER — BENZTROPINE MESYLATE 1 MG PO TABS
1.0000 mg | ORAL_TABLET | Freq: Every day | ORAL | Status: DC
  Administered 2022-03-13: 1 mg via ORAL
  Filled 2022-03-13: qty 1

## 2022-03-13 MED ORDER — NIRMATRELVIR/RITONAVIR (PAXLOVID)TABLET
3.0000 | ORAL_TABLET | Freq: Two times a day (BID) | ORAL | Status: DC
  Administered 2022-03-13 (×2): 3 via ORAL
  Filled 2022-03-13: qty 30

## 2022-03-13 MED ORDER — GEMFIBROZIL 600 MG PO TABS
600.0000 mg | ORAL_TABLET | Freq: Two times a day (BID) | ORAL | Status: DC
  Administered 2022-03-13: 600 mg via ORAL
  Filled 2022-03-13: qty 1

## 2022-03-13 MED ORDER — PIOGLITAZONE HCL 30 MG PO TABS
45.0000 mg | ORAL_TABLET | Freq: Every day | ORAL | Status: DC
  Administered 2022-03-13: 45 mg via ORAL
  Filled 2022-03-13: qty 1

## 2022-03-13 MED ORDER — METFORMIN HCL 500 MG PO TABS
1000.0000 mg | ORAL_TABLET | Freq: Two times a day (BID) | ORAL | Status: DC
  Administered 2022-03-13: 1000 mg via ORAL
  Filled 2022-03-13: qty 2

## 2022-03-13 MED ORDER — FENOFIBRATE 54 MG PO TABS
54.0000 mg | ORAL_TABLET | Freq: Every day | ORAL | Status: DC
  Administered 2022-03-13: 54 mg via ORAL
  Filled 2022-03-13: qty 1

## 2022-03-13 MED ORDER — SIMVASTATIN 10 MG PO TABS: 20.0000 mg | ORAL_TABLET | Freq: Every day | ORAL | Status: DC

## 2022-03-13 MED ORDER — LISINOPRIL 10 MG PO TABS
10.0000 mg | ORAL_TABLET | Freq: Every day | ORAL | Status: DC
  Administered 2022-03-13: 10 mg via ORAL
  Filled 2022-03-13: qty 1

## 2022-03-13 MED ORDER — LEVOTHYROXINE SODIUM 88 MCG PO TABS
88.0000 ug | ORAL_TABLET | Freq: Every day | ORAL | Status: DC
  Administered 2022-03-13: 88 ug via ORAL
  Filled 2022-03-13: qty 1

## 2022-03-13 MED ORDER — ASPIRIN 81 MG PO TBEC
81.0000 mg | DELAYED_RELEASE_TABLET | Freq: Every day | ORAL | Status: DC
  Administered 2022-03-13: 81 mg via ORAL
  Filled 2022-03-13: qty 1

## 2022-03-13 MED ORDER — TEMAZEPAM 15 MG PO CAPS
15.0000 mg | ORAL_CAPSULE | Freq: Every day | ORAL | Status: DC
  Administered 2022-03-13: 15 mg via ORAL
  Filled 2022-03-13: qty 1
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
## 2022-03-13 NOTE — ED Notes (Signed)
Breakfast tray given to pt 

## 2022-03-13 NOTE — ED Notes (Signed)
Pt given shower supplies to shower.
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
## 2022-03-13 NOTE — ED Notes (Signed)
Patient cursing at this RN.  When asked why he is upset patient states because he is hungry and we are given food to everyone but him.  Explained to patient that no one in the unit he is in gets food at 9:30 pm per hospital policy.  Patient then begins talking about not having food or anything to drink at home.

## 2022-03-13 NOTE — ED Notes (Signed)
IVC/Consult Completed/Observe Overnight with D/C this Am

## 2022-03-13 NOTE — Consult Note (Signed)
Client continues to deny suicidal/homicidal ideations, hallucinations, and substance abuse.  Recommended he self-isolate to prevent the spread of COVID, psych cleared.  Nanine Means, pMHNP

## 2022-03-13 NOTE — Discharge Instructions (Signed)
You were started on medications for COVID.  You should remain quarantine for 10 days and return to the ER for any other concerns.  Psychiatry has cleared you for discharge home.

## 2022-03-13 NOTE — ED Provider Notes (Signed)
Emergency Medicine Observation Re-evaluation Note  Derrick Atkins is a 51 y.o. male, seen on rounds today.  Pt initially presented to the ED for complaints of Agitation  Currently, the patient is no acute distress. Pt is IVC    Physical Exam  Blood pressure (!) 153/75, pulse 79, temperature 98 F (36.7 C), temperature source Oral, resp. rate 17, SpO2 98 %.  Physical Exam General: No apparent distress Pulm: Normal WOB Psych: resting     ED Course / MDM     I have reviewed the labs performed to date as well as medications administered while in observation.  Recent changes in the last 24 hours include none  Plan   Current plan is to continue to wait for psych plan/placement if felt warranted  Patient is under full IVC at this time.   Concha Se, MD 03/13/22 [id number]

## 2022-03-13 NOTE — ED Provider Notes (Signed)
Pt cleared by Asher Muir Lord-reviewed patient's blood work and patient was COVID-positive but was already started on Paxlovid and given treatment for it.     Concha Se, MD 03/13/22 1047

## 2022-03-13 NOTE — BH Assessment (Signed)
This writer attempted to contact pt's legal guardian [name] [phone]) to notify of pt's plan of care; however there was no answer. A HIPPA compliant voicemail was left requesting a call back.

## 2022-03-15 ENCOUNTER — Emergency Department: Payer: Medicare Other

## 2022-03-15 ENCOUNTER — Encounter: Payer: Self-pay | Admitting: Intensive Care

## 2022-03-15 ENCOUNTER — Emergency Department
Admission: EM | Admit: 2022-03-15 | Discharge: 2022-03-19 | Disposition: A | Discharge status: Other Acute Inpatient Hospital | Payer: Medicare Other | Attending: Emergency Medicine | Admitting: Emergency Medicine

## 2022-03-15 ENCOUNTER — Other Ambulatory Visit: Payer: Self-pay

## 2022-03-15 DIAGNOSIS — F200 Paranoid schizophrenia: Secondary | ICD-10-CM | POA: Insufficient documentation

## 2022-03-15 DIAGNOSIS — U071 COVID-19: Secondary | ICD-10-CM | POA: Diagnosis not present

## 2022-03-15 DIAGNOSIS — M25549 Pain in joints of unspecified hand: Secondary | ICD-10-CM | POA: Insufficient documentation

## 2022-03-15 DIAGNOSIS — F329 Major depressive disorder, single episode, unspecified: Secondary | ICD-10-CM | POA: Diagnosis not present

## 2022-03-15 DIAGNOSIS — M7989 Other specified soft tissue disorders: Secondary | ICD-10-CM | POA: Insufficient documentation

## 2022-03-15 DIAGNOSIS — R45851 Suicidal ideations: Secondary | ICD-10-CM | POA: Diagnosis not present

## 2022-03-15 DIAGNOSIS — R0602 Shortness of breath: Secondary | ICD-10-CM | POA: Diagnosis not present

## 2022-03-15 DIAGNOSIS — F32A Depression, unspecified: Secondary | ICD-10-CM

## 2022-03-15 LAB — CBC
HCT: 38 % — ABNORMAL LOW (ref 39.0–52.0)
Hemoglobin: 12.6 g/dL — ABNORMAL LOW (ref 13.0–17.0)
MCH: 26.8 pg (ref 26.0–34.0)
MCHC: 33.2 g/dL (ref 30.0–36.0)
MCV: 80.9 fL (ref 80.0–100.0)
Platelets: 211 10*3/uL (ref 150–400)
RBC: 4.7 MIL/uL (ref 4.22–5.81)
RDW: 14.9 % (ref 11.5–15.5)
WBC: 5.6 10*3/uL (ref 4.0–10.5)
nRBC: 0 % (ref 0.0–0.2)

## 2022-03-15 LAB — URINE DRUG SCREEN, QUALITATIVE (ARMC ONLY)
Amphetamines, Ur Screen: NOT DETECTED
Barbiturates, Ur Screen: NOT DETECTED
Benzodiazepine, Ur Scrn: NOT DETECTED
Cannabinoid 50 Ng, Ur ~~LOC~~: NOT DETECTED
Cocaine Metabolite,Ur ~~LOC~~: NOT DETECTED
MDMA (Ecstasy)Ur Screen: NOT DETECTED
Methadone Scn, Ur: NOT DETECTED
Opiate, Ur Screen: NOT DETECTED
Phencyclidine (PCP) Ur S: NOT DETECTED
Tricyclic, Ur Screen: NOT DETECTED

## 2022-03-15 LAB — COMPREHENSIVE METABOLIC PANEL
ALT: 32 U/L (ref 0–44)
AST: 35 U/L (ref 15–41)
Albumin: 4 g/dL (ref 3.5–5.0)
Alkaline Phosphatase: 74 U/L (ref 38–126)
Anion gap: 8 (ref 5–15)
BUN: 11 mg/dL (ref 6–20)
CO2: 21 mmol/L — ABNORMAL LOW (ref 22–32)
Calcium: 8.9 mg/dL (ref 8.9–10.3)
Chloride: 108 mmol/L (ref 98–111)
Creatinine, Ser: 0.77 mg/dL (ref 0.61–1.24)
GFR, Estimated: >60 mL/min (ref >60)
Glucose, Bld: 210 mg/dL — ABNORMAL HIGH (ref 70–99)
Potassium: 3.8 mmol/L (ref 3.5–5.1)
Sodium: 137 mmol/L (ref 135–145)
Total Bilirubin: 0.8 mg/dL (ref 0.3–1.2)
Total Protein: 6.7 g/dL (ref 6.5–8.1)

## 2022-03-15 LAB — ETHANOL: Alcohol, Ethyl (B): <10 mg/dL (ref <10)

## 2022-03-15 LAB — TROPONIN I (HIGH SENSITIVITY): Troponin I (High Sensitivity): 6 ng/L (ref <18)

## 2022-03-15 LAB — SARS CORONAVIRUS 2 BY RT PCR: SARS Coronavirus 2 by RT PCR: POSITIVE — AB

## 2022-03-15 LAB — BRAIN NATRIURETIC PEPTIDE: B Natriuretic Peptide: 13.2 pg/mL (ref 0.0–100.0)

## 2022-03-15 LAB — ACETAMINOPHEN LEVEL: Acetaminophen (Tylenol), Serum: <10 ug/mL — ABNORMAL LOW (ref 10–30)

## 2022-03-15 LAB — SALICYLATE LEVEL: Salicylate Lvl: <7 mg/dL — ABNORMAL LOW (ref 7.0–30.0)

## 2022-03-15 NOTE — ED Notes (Signed)
Pt has soaking wet shirt on due to turning on shower and getting in shower with clothes on. Pt will not take off shirt despite being provided with clean, dry clothes.

## 2022-03-15 NOTE — BH Assessment (Signed)
Comprehensive Clinical Assessment (CCA) Screening, Triage and Referral Note  03/15/2022 REA RESER [id number]  Derrick Atkins, 51 year old male who presents to [facility] ED voluntarily for treatment. Per triage note, Patient c/o being suicidal, left-hand pain, and left rib cage pain. Patient reports no one brings him his medicines, so he doesn't have them to take at home. He reported an Technical sales engineer showed up at his house this morning to bring him here and he is not sure why. Officer left before this RN could ask her questions. Patient noted to have swelling in the right and left feet/legs.   During TTS assessment pt presents alert and oriented x 4, anxious but cooperative, and mood-congruent with affect. The pt does not appear to be responding to internal or external stimuli. Neither is the pt presenting with any delusional thinking. Pt verified the information provided to triage RN.   Pt identifies his main complaint to be that he is endorsing suicidal ideations. Patient states he wants to live in a group home. Patient reports he is lonely and unable to drive. Patient reports he does not like his legal guardian and prefers to live with others at a group home. Patient presents sitting up in bed eating. Patient was seen in [facility] 2 days ago for same issue. Patient will follow up with ACT [facility]- [facility] for additional support. Patient denies using any illicit substances and alcohol. Pt denies current HI/AH/VH.    Per Derrick Muir, NP, pt shows no evidence of imminent risk to self or others at present and does not meet criteria for psychiatric inpatient admission.  Chief Complaint:  Chief Complaint  Patient presents with   Suicidal   Hand Pain   Visit Diagnosis: Schizophrenia, paranoid, chronic  Patient Reported Information How did you hear about [place]? -- Mudlogger)  What Is the Reason for Your Visit/Call Today? Patient was brought to the ED by law enforcement due to endorsing SI.  How Long Has  This Been Causing You Problems? > than 6 months  What Do You Feel Would Help You the Most Today? Treatment for Depression or other mood problem; Housing Assistance   Have You Recently Had Any Thoughts About Hurting Yourself? No  Are You Planning to Commit Suicide/Harm Yourself At This time? No   Have you Recently Had Thoughts About Hurting Someone Derrick Atkins? No  Are You Planning to Harm Someone at This Time? No  Explanation: No data recorded  Have You Used Any Alcohol or Drugs in the Past 24 Hours? No  How Long Ago Did You Use Drugs or Alcohol? No data recorded What Did You Use and How Much? No data recorded  Do You Currently Have a Therapist/Psychiatrist? Yes  Name of Therapist/Psychiatrist: Easter Atkins   Have You Been Recently Discharged From Any Public relations account executive or Programs? No  Explanation of Discharge From Practice/Program: No data recorded   CCA Screening Triage Referral Assessment Type of Contact: Face-to-Face  Telemedicine Service Delivery:   Is this Initial or Reassessment? No data recorded Date Telepsych consult ordered in CHL:  No data recorded Time Telepsych consult ordered in CHL:  No data recorded Location of Assessment: [facility] ED  Provider Location: [facility] ED   Collateral Involvement: None provided   Does Patient Have a Court Appointed Legal Guardian? No data recorded Name and Contact of Legal Guardian: No data recorded If Minor and Not Living with Parent(s), Who has Custody? n/a  Is CPS involved or ever been involved? Never  Is APS involved  or ever been involved? Never   Patient Determined To Be At Risk for Harm To Self or Others Based on Review of Patient Reported Information or Presenting Complaint? No  Method: No data recorded Availability of Means: No data recorded Intent: No data recorded Notification Required: No data recorded Additional Information for Danger to Others Potential: No data recorded Additional Comments for Danger to Others  Potential: No data recorded Are There Guns or Other Weapons in Your Home? No data recorded Types of Guns/Weapons: No data recorded Are These Weapons Safely Secured?                            No data recorded Who Could Verify You Are Able To Have These Secured: No data recorded Do You Have any Outstanding Charges, Pending Court Dates, Parole/Probation? No data recorded Contacted To Inform of Risk of Harm To Self or Others: No data recorded  Does Patient Present under Involuntary Commitment? No  IVC Papers Initial File Date: 03/12/22   [place] of Residence: [place]   Patient Currently Receiving the Following Services: Medication Management; ACTT (Assertive Community Treatment)   Determination of Need: Emergent (2 hours)   Options For Referral: ED Visit   Discharge Disposition:     Derrick Atkins, Counselor, LCAS-A

## 2022-03-15 NOTE — ED Notes (Signed)
Pt returned from xray

## 2022-03-15 NOTE — Discharge Instructions (Addendum)
Follow up with [facility] Stay quarantine for your known COVID

## 2022-03-15 NOTE — ED Notes (Signed)
Vol/ Pysch consult ordered/pending

## 2022-03-15 NOTE — ED Notes (Signed)
Dr. Fuller Plan, MD in triage speaking to patient at this time.

## 2022-03-15 NOTE — ED Notes (Signed)
Contacted [facility] to attempt to have [employer] take pt back home as they were the ones to bring pt to ED. Spoke with Vonna Kotyk with [place]. Call back number provided, will attempt to find a ride and update
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
## 2022-03-15 NOTE — Consult Note (Signed)
[facility] Face-to-Face Psychiatry Consult   Reason for Consult:  suicidal ideations Referring Physician:  EDP Patient Identification: Derrick Atkins MRN:  [id number] Principal Diagnosis: Schizophrenia, paranoid, chronic (HCC) Diagnosis:  Principal Problem:   Schizophrenia, paranoid, chronic (HCC)   Total Time spent with patient: 45 minutes  Subjective:   Derrick Atkins is a 51 y.o. male patient admitted with suicidal ideations.  HPI:  51 yo male with schizoaffective disorder who came to the ED with suicidal ideations. Chronic, passive suicidal ideations; at his baseline.  On assessment, he voices he wants a group home because he is isolated in his place as he cannot drive.  Denies homicidal ideations, hallucinations, and substance abuse.  Eating heartily on his bed watching television.  He is frustrated with his legal guardian as he wants a group home.  No other concerns, feels safe returning home.  Psych cleared, follow up with ACT team, Delma Post.  Past Psychiatric History: schizoaffective d/o  Risk to Self:  none Risk to Others:  none Prior Inpatient Therapy:  multiple Prior Outpatient Therapy:  yes  Past Medical History:  Past Medical History:  Diagnosis Date   Cerebral palsy (HCC)    Depression    Diabetes mellitus without complication (HCC)    Hypertension    Kidney stones    Schizoaffective disorder (HCC)    Scoliosis     Past Surgical History:  Procedure Laterality Date   BOWEL RESECTION     LAPAROTOMY N/A 05/15/2020   Procedure: EXPLORATORY LAPAROTOMY;  Surgeon: Henrene Dodge, MD;  Location: [facility] ORS;  Service: General;  Laterality: N/A;   Family History:  Family History  Problem Relation Age of Onset   Heart disease Father    Heart attack Father    Family Psychiatric  History: see above Social History:  Social History   Substance and Sexual Activity  Alcohol Use No     Social History   Substance and Sexual Activity  Drug Use No    Social History    Socioeconomic History   Marital status: Single    Spouse name: Not on file   Number of children: Not on file   Years of education: Not on file   Highest education level: Not on file  Occupational History   Not on file  Tobacco Use   Smoking status: Never   Smokeless tobacco: Never  Vaping Use   Vaping Use: Never used  Substance and Sexual Activity   Alcohol use: No   Drug use: No   Sexual activity: Not Currently  Other Topics Concern   Not on file  Social History Narrative   Not on file   Social Determinants of Health   Financial Resource Strain: Not on file  Food Insecurity: Not on file  Transportation Needs: Not on file  Physical Activity: Not on file  Stress: Not on file  Social Connections: Not on file   Additional Social History:    Allergies:   Allergies  Allergen Reactions   Phenytoin Sodium Extended Other (See Comments) and Nausea And Vomiting   Prednisone Other (See Comments)   Latex Hives, Nausea And Vomiting and Rash    Labs:  Results for orders placed or performed during the hospital encounter of 03/15/22 (from the past 48 hour(s))  Comprehensive metabolic panel     Status: Abnormal   Collection Time: 03/15/22 12:12 PM  Result Value Ref Range   Sodium 137 135 - 145 mmol/L   Potassium 3.8 3.5 - 5.1 mmol/L  Chloride 108 98 - 111 mmol/L   CO2 21 (L) 22 - 32 mmol/L   Glucose, Bld 210 (H) 70 - 99 mg/dL    Comment: Glucose reference range applies only to samples taken after fasting for at least 8 hours.   BUN 11 6 - 20 mg/dL   Creatinine, Ser 1.610.77 0.61 - 1.24 mg/dL   Calcium 8.9 8.9 - 09.610.3 mg/dL   Total Protein 6.7 6.5 - 8.1 g/dL   Albumin 4.0 3.5 - 5.0 g/dL   AST 35 15 - 41 U/L   ALT 32 0 - 44 U/L   Alkaline Phosphatase 74 38 - 126 U/L   Total Bilirubin 0.8 0.3 - 1.2 mg/dL   GFR, Estimated >04>60 >54>60 mL/min    Comment: (NOTE) Calculated using the CKD-EPI Creatinine Equation (2021)    Anion gap 8 5 - 15    Comment: Performed at [facility], [address] [place], [place] [address]  Ethanol     Status: None   Collection Time: 03/15/22 12:12 PM  Result Value Ref Range   Alcohol, Ethyl (B) <10 <10 mg/dL    Comment: (NOTE) Lowest detectable limit for serum alcohol is 10 mg/dL.  For medical purposes only. Performed at [facility], [address] [place], [place] [address]   Salicylate level     Status: Abnormal   Collection Time: 03/15/22 12:12 PM  Result Value Ref Range   Salicylate Lvl <7.0 (L) 7.0 - 30.0 mg/dL    Comment: Performed at [facility], [address] [place], [place] [address]  Acetaminophen level     Status: Abnormal   Collection Time: 03/15/22 12:12 PM  Result Value Ref Range   Acetaminophen (Tylenol), Serum <10 (L) 10 - 30 ug/mL    Comment: (NOTE) Therapeutic concentrations vary significantly. A range of 10-30 ug/mL  may be an effective concentration for many patients. However, some  are best treated at concentrations outside of this range. Acetaminophen concentrations >150 ug/mL at 4 hours after ingestion  and >50 ug/mL at 12 hours after ingestion are often associated with  toxic reactions.  Performed at [facility], [address] [place], [place] [address]   cbc     Status: Abnormal   Collection Time: 03/15/22 12:12 PM  Result Value Ref Range   WBC 5.6 4.0 - 10.5 K/uL   RBC 4.70 4.22 - 5.81 MIL/uL   Hemoglobin 12.6 (L) 13.0 - 17.0 g/dL   HCT 57.838.0 (L) 46.939.0 - 62.952.0 %   MCV 80.9 80.0 - 100.0 fL   MCH 26.8 26.0 - 34.0 pg   MCHC 33.2 30.0 - 36.0 g/dL   RDW 52.814.9 41.311.5 - 24.415.5 %   Platelets 211 150 - 400 K/uL   nRBC 0.0 0.0 - 0.2 %    Comment: Performed at [facility], [address] [place], [place] [address]  Urine Drug Screen, Qualitative     Status: None   Collection Time: 03/15/22 12:12 PM  Result Value Ref Range   Tricyclic, Ur Screen NONE DETECTED NONE DETECTED   Amphetamines, Ur Screen NONE DETECTED NONE DETECTED   MDMA  (Ecstasy)Ur Screen NONE DETECTED NONE DETECTED   Cocaine Metabolite,Ur [place] NONE DETECTED NONE DETECTED   Opiate, Ur Screen NONE DETECTED NONE DETECTED   Phencyclidine (PCP) Ur S NONE DETECTED NONE DETECTED   Cannabinoid 50 Ng, Ur [place] NONE DETECTED NONE DETECTED   Barbiturates, Ur Screen NONE DETECTED NONE DETECTED   Benzodiazepine, Ur Scrn  NONE DETECTED NONE DETECTED   Methadone Scn, Ur NONE DETECTED NONE DETECTED    Comment: (NOTE) Tricyclics + metabolites, urine    Cutoff 1000 ng/mL Amphetamines + metabolites, urine  Cutoff 1000 ng/mL MDMA (Ecstasy), urine              Cutoff 500 ng/mL Cocaine Metabolite, urine          Cutoff 300 ng/mL Opiate + metabolites, urine        Cutoff 300 ng/mL Phencyclidine (PCP), urine         Cutoff 25 ng/mL Cannabinoid, urine                 Cutoff 50 ng/mL Barbiturates + metabolites, urine  Cutoff 200 ng/mL Benzodiazepine, urine              Cutoff 200 ng/mL Methadone, urine                   Cutoff 300 ng/mL  The urine drug screen provides only a preliminary, unconfirmed analytical test result and should not be used for non-medical purposes. Clinical consideration and professional judgment should be applied to any positive drug screen result due to possible interfering substances. A more specific alternate chemical method must be used in order to obtain a confirmed analytical result. Gas chromatography / mass spectrometry (GC/MS) is the preferred confirm atory method. Performed at [facility], [address] [place], [place] [address]   SARS Coronavirus 2 by RT PCR (hospital order, performed in [facility] hospital lab) *cepheid single result test* Anterior Nasal Swab     Status: Abnormal   Collection Time: 03/15/22  2:32 PM   Specimen: Anterior Nasal Swab  Result Value Ref Range   SARS Coronavirus 2 by RT PCR POSITIVE (A) NEGATIVE    Comment: (NOTE) SARS-CoV-2 target nucleic acids are DETECTED  SARS-CoV-2 RNA is generally detectable  in upper respiratory specimens  during the acute phase of infection.  Positive results are indicative  of the presence of the identified virus, but do not rule out bacterial infection or co-infection with other pathogens not detected by the test.  Clinical correlation with patient history and  other diagnostic information is necessary to determine patient infection status.  The expected result is negative.  Fact Sheet for Patients:   RoadLapTop.co.za   Fact Sheet for Healthcare Providers:   http://kim-miller.com/    This test is not yet approved or cleared by the [place] FDA and  has been authorized for detection and/or diagnosis of SARS-CoV-2 by FDA under an Emergency Use Authorization (EUA).  This EUA will remain in effect (meaning this test can be used) for the duration of  the COVID-19 declaration under Section 564(b)(1)  of the Act, 21 U.S.C. section 360-bbb-3(b)(1), unless the authorization is terminated or revoked sooner.   Performed at [facility], [address] [place], [place] [address]     No current facility-administered medications for this encounter.   Current Outpatient Medications  Medication Sig Dispense Refill   [START ON 03/18/2022] ARIPiprazole ER (ABILIFY MAINTENA) 400 MG SRER injection Inject 2 mLs (400 mg total) into the muscle every 28 (twenty-eight) days. Next dose due 03/18/22 1 each 1   aspirin EC 81 MG tablet Take 1 tablet (81 mg total) by mouth daily. Swallow whole. 30 tablet 1   benztropine (COGENTIN) 1 MG tablet Take 1 tablet (1 mg total) by mouth daily. (Patient not taking: Reported on 03/09/2022) 30 tablet 1  fenofibrate 54 MG tablet Take 1 tablet (54 mg total) by mouth daily. 30 tablet 1   gemfibrozil (LOPID) 600 MG tablet Take 1 tablet (600 mg total) by mouth 2 (two) times daily. 60 tablet 1   levothyroxine (SYNTHROID) 88 MCG tablet Take 1 tablet (88 mcg total) by mouth daily. 30 tablet  1   lisinopril (ZESTRIL) 10 MG tablet Take 10 mg by mouth daily.     metFORMIN (GLUCOPHAGE) 1000 MG tablet Take 1 tablet (1,000 mg total) by mouth 2 (two) times daily with a meal. 60 tablet 1   pioglitazone (ACTOS) 45 MG tablet Take 1 tablet (45 mg total) by mouth daily. 30 tablet 1   simvastatin (ZOCOR) 20 MG tablet Take 1 tablet (20 mg total) by mouth at bedtime. 30 tablet 1   temazepam (RESTORIL) 15 MG capsule Take 1 capsule (15 mg total) by mouth at bedtime. 30 capsule 1    Musculoskeletal: Strength & Muscle Tone: within normal limits Gait & Station: normal Patient leans: N/A  Psychiatric Specialty Exam: Physical Exam Vitals and nursing note reviewed.  Constitutional:      Appearance: Normal appearance.  HENT:     Head: Normocephalic.     Nose: Nose normal.  Pulmonary:     Effort: Pulmonary effort is normal.  Musculoskeletal:        General: Normal range of motion.     Cervical back: Normal range of motion.  Neurological:     General: No focal deficit present.     Mental Status: He is alert and oriented to person, place, and time.  Psychiatric:        Attention and Perception: Attention and perception normal.        Mood and Affect: Mood is anxious. Affect is flat.        Speech: Speech normal.        Behavior: Behavior normal. Behavior is cooperative.        Thought Content: Thought content normal.        Cognition and Memory: Cognition and memory normal.        Judgment: Judgment normal.     Review of Systems  Psychiatric/Behavioral:  The patient is nervous/anxious.   All other systems reviewed and are negative.   Blood pressure (!) 154/78, pulse (!) 102, temperature 98.7 F (37.1 C), temperature source Oral, resp. rate 16, height 6\' 4"  (1.93 m), weight 109 kg, SpO2 97 %.Body mass index is 29.25 kg/m.  General Appearance: Casual  Eye Contact:  Good  Speech:  Normal Rate  Volume:  Normal  Mood:  Anxious  Affect:  Flat  Thought Process:  Coherent  Orientation:   Full (Time, Place, and Person)  Thought Content:  WDL and Logical  Suicidal Thoughts:  No  Homicidal Thoughts:  No  Memory:  Immediate;   Good Recent;   Good Remote;   Good  Judgement:  Good  Insight:  Good  Psychomotor Activity:  Normal  Concentration:  Concentration: Good and Attention Span: Good  Recall:  Good  Fund of Knowledge:  Good  Language:  Good  Akathisia:  No  Handed:  Right  AIMS (if indicated):     Assets:  Housing Leisure Time Physical Health Resilience Social Support  ADL's:  Intact  Cognition:  WNL  Sleep:        Physical Exam: Physical Exam Vitals and nursing note reviewed.  Constitutional:      Appearance: Normal appearance.  HENT:     Head:  Normocephalic.     Nose: Nose normal.  Pulmonary:     Effort: Pulmonary effort is normal.  Musculoskeletal:        General: Normal range of motion.     Cervical back: Normal range of motion.  Neurological:     General: No focal deficit present.     Mental Status: He is alert and oriented to person, place, and time.  Psychiatric:        Attention and Perception: Attention and perception normal.        Mood and Affect: Mood is anxious. Affect is flat.        Speech: Speech normal.        Behavior: Behavior normal. Behavior is cooperative.        Thought Content: Thought content normal.        Cognition and Memory: Cognition and memory normal.        Judgment: Judgment normal.    Review of Systems  Psychiatric/Behavioral:  The patient is nervous/anxious.   All other systems reviewed and are negative.  Blood pressure (!) 154/78, pulse (!) 102, temperature 98.7 F (37.1 C), temperature source Oral, resp. rate 16, height 6\' 4"  (1.93 m), weight 109 kg, SpO2 97 %. Body mass index is 29.25 kg/m.  Treatment Plan Summary: Schizoaffective disorder, bipolar type: Follow up with outpatient provider, no medication changes Follow up with  Disposition: No evidence of imminent risk to self or others  at present.   Patient does not meet criteria for psychiatric inpatient admission. Supportive therapy provided about ongoing stressors.  [name], NP 03/15/2022 4:24 PM

## 2022-03-15 NOTE — ED Triage Notes (Addendum)
Patient c/o being suicidal, left hand pain, and left rib cage pain . Patient reports no one brings him his medicines so he doesn't have them to take at home.   He reported an Technical sales engineer showed up at his house this morning to bring him here and he is not sure why. Officer left before this RN could ask her questions.   Patient noted to have swelling in the right and left feet/legs.

## 2022-03-15 NOTE — ED Notes (Signed)
Report received from Andrea, RN including SBAR. Patient alert and oriented, warm and dry, in no acute distress. Patient denies SI, HI, AVH and pain. Patient made aware of Q15 minute rounds and security cameras for their safety. Patient instructed to come to this nurse with needs or concerns.  

## 2022-03-15 NOTE — ED Notes (Signed)
X-ray took pt to x-ray 

## 2022-03-16 DIAGNOSIS — F200 Paranoid schizophrenia: Secondary | ICD-10-CM | POA: Diagnosis not present

## 2022-03-16 MED ORDER — BENZTROPINE MESYLATE 1 MG PO TABS
1.0000 mg | ORAL_TABLET | Freq: Every day | ORAL | Status: DC
  Administered 2022-03-17 – 2022-03-23 (×7): 1 mg via ORAL
  Filled 2022-03-16 (×7): qty 1

## 2022-03-16 MED ORDER — FENOFIBRATE 54 MG PO TABS
54.0000 mg | ORAL_TABLET | Freq: Every day | ORAL | Status: DC
  Administered 2022-03-17 – 2022-03-23 (×7): 54 mg via ORAL
  Filled 2022-03-16 (×8): qty 1

## 2022-03-16 MED ORDER — PIOGLITAZONE HCL 30 MG PO TABS
45.0000 mg | ORAL_TABLET | Freq: Every day | ORAL | Status: DC
  Administered 2022-03-17 – 2022-03-23 (×7): 45 mg via ORAL
  Filled 2022-03-16 (×8): qty 1

## 2022-03-16 MED ORDER — MIDAZOLAM HCL 2 MG/2ML IJ SOLN
INTRAMUSCULAR | Status: AC
  Administered 2022-03-16: 2 mg via INTRAMUSCULAR
  Filled 2022-03-16: qty 2

## 2022-03-16 MED ORDER — LEVOTHYROXINE SODIUM 88 MCG PO TABS
88.0000 ug | ORAL_TABLET | Freq: Every day | ORAL | Status: DC
  Administered 2022-03-17 – 2022-03-23 (×7): 88 ug via ORAL
  Filled 2022-03-16 (×7): qty 1

## 2022-03-16 MED ORDER — ASPIRIN 81 MG PO TBEC
81.0000 mg | DELAYED_RELEASE_TABLET | Freq: Every day | ORAL | Status: DC
  Administered 2022-03-17 – 2022-03-23 (×7): 81 mg via ORAL
  Filled 2022-03-16 (×7): qty 1

## 2022-03-16 MED ORDER — METFORMIN HCL 500 MG PO TABS
1000.0000 mg | ORAL_TABLET | Freq: Two times a day (BID) | ORAL | Status: DC
  Administered 2022-03-17 – 2022-03-23 (×13): 1000 mg via ORAL
  Filled 2022-03-16 (×13): qty 2

## 2022-03-16 MED ORDER — HALOPERIDOL LACTATE 5 MG/ML IJ SOLN: 5.0000 mg | Freq: Once | INTRAMUSCULAR | Status: AC

## 2022-03-16 MED ORDER — TEMAZEPAM 15 MG PO CAPS
15.0000 mg | ORAL_CAPSULE | Freq: Every day | ORAL | Status: DC
  Administered 2022-03-16 – 2022-03-22 (×7): 15 mg via ORAL
  Filled 2022-03-16 (×7): qty 1

## 2022-03-16 MED ORDER — SIMVASTATIN 10 MG PO TABS
20.0000 mg | ORAL_TABLET | Freq: Every day | ORAL | Status: DC
  Administered 2022-03-16 – 2022-03-22 (×7): 20 mg via ORAL
  Filled 2022-03-16 (×7): qty 2

## 2022-03-16 MED ORDER — HALOPERIDOL LACTATE 5 MG/ML IJ SOLN
INTRAMUSCULAR | Status: AC
  Administered 2022-03-16: 5 mg via INTRAMUSCULAR
  Filled 2022-03-16: qty 1

## 2022-03-16 MED ORDER — MIDAZOLAM HCL 2 MG/2ML IJ SOLN: 2.0000 mg | Freq: Once | INTRAMUSCULAR | Status: AC

## 2022-03-16 MED ORDER — LISINOPRIL 10 MG PO TABS
10.0000 mg | ORAL_TABLET | Freq: Every day | ORAL | Status: DC
  Administered 2022-03-17 – 2022-03-23 (×7): 10 mg via ORAL
  Filled 2022-03-16 (×7): qty 1

## 2022-03-16 MED ORDER — GEMFIBROZIL 600 MG PO TABS
600.0000 mg | ORAL_TABLET | Freq: Two times a day (BID) | ORAL | Status: DC
  Administered 2022-03-16 – 2022-03-23 (×14): 600 mg via ORAL
  Filled 2022-03-16 (×15): qty 1

## 2022-03-16 NOTE — ED Notes (Signed)
Pt heard singing loudly in his room. Alert with room darkened per his request.

## 2022-03-16 NOTE — ED Notes (Signed)
Pt has grabbed [employer], [place]. This nurse approaches pt and shuts pt door. He is argumentative with nurse as he claims he does not have COVID. Pt does not believe staff and yells. Does close door on own now after opening repeatedly
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
## 2022-03-16 NOTE — ED Notes (Signed)
Derrick Atkins, Consulting civil engineer and Dr. Fuller Plan inform this nurse that pt is not to be kept here or IVC'd should he act out and plan will remain to be discharged.

## 2022-03-16 NOTE — ED Provider Notes (Signed)
Emergency Medicine Observation Re-evaluation Note  Derrick Atkins is a 51 y.o. male, seen on rounds today.  Pt initially presented to the ED for complaints of Suicidal and Hand Pain Currently, the patient is resting.  Physical Exam  BP (!) 169/79 (BP Location: Left Arm)   Pulse 64   Temp 98.6 F (37 C) (Oral)   Resp 16   Ht 1.93 m (6\' 4" )   Wt 109 kg   SpO2 99%   BMI 29.25 kg/m  Physical Exam Gen:  No acute distress Resp:  Breathing easily and comfortably, no accessory muscle usage Neuro:  Moving all four extremities, no gross focal neuro deficits Psych:  Resting currently, frequently difficult when awake with inappropriate behavior.  ED Course / MDM  EKG:   I have reviewed the labs performed to date as well as medications administered while in observation.  Recent changes in the last 24 hours include evaluation by EDP.  Patient has been cleared for discharge home.  He has a legal guardian but lives by himself.  Unfortunately there is no transportation available to take him home tonight and the Department refused to take him home (even though they brought him here) because he tested COVID-positive.  He is awaiting transportation in the morning.  Plan  Current plan is for discharge home in the morning.    [employer], MD 03/16/22 [phone]

## 2022-03-16 NOTE — ED Notes (Signed)
[employer] is unable to take pt home due to COVID + status. They offer to go to residence to find a ride, updated pt lives alone. They ask for updates on what attempts have been made and are updated of unsuccess. They ask for pt legal guardian contact information and leave. Dr. York Cerise and Aundra Millet, charge RN notified of situation

## 2022-03-16 NOTE — ED Notes (Signed)
This NT went in to do vitals. Pt refused stating "No. Go away."

## 2022-03-16 NOTE — ED Notes (Signed)
Patient is sleep.

## 2022-03-16 NOTE — Consult Note (Signed)
Dr Toni Amend request he stays in the ED as he is being troublesome to the public.  After he clears COVID, hospitalization will be sought.  Nanine Means, PMHNP

## 2022-03-16 NOTE — ED Notes (Signed)
Breakfast tray and beverage provided 

## 2022-03-16 NOTE — ED Notes (Signed)
Pt redressed himself into hospital clothes, black shoes, navy pants, black jacket, keys on lanyard

## 2022-03-16 NOTE — ED Notes (Signed)
Patient given IM medications with assistance of security guard. Patient was assisted to mattress on floor due to the height mattress, patient held security hand while receiving medication.

## 2022-03-16 NOTE — ED Notes (Signed)
Vol / pending discharge

## 2022-03-16 NOTE — ED Notes (Signed)
Lunch tray and beverage provided 

## 2022-03-16 NOTE — ED Notes (Signed)
[employer] has officer in route, pt provided with bag 1 of 1 and is putting his clothes on

## 2022-03-17 DIAGNOSIS — F200 Paranoid schizophrenia: Secondary | ICD-10-CM | POA: Diagnosis not present

## 2022-03-17 MED ORDER — ACETAMINOPHEN 500 MG PO TABS
1000.0000 mg | ORAL_TABLET | Freq: Once | ORAL | Status: AC
  Administered 2022-03-17: 1000 mg via ORAL
  Filled 2022-03-17: qty 2

## 2022-03-17 MED ORDER — RISPERIDONE 1 MG PO TABS
1.0000 mg | ORAL_TABLET | Freq: Two times a day (BID) | ORAL | Status: DC
  Administered 2022-03-17 – 2022-03-18 (×3): 1 mg via ORAL
  Filled 2022-03-17 (×4): qty 1

## 2022-03-17 NOTE — ED Notes (Signed)
Report received from Annie, RN including SBAR. Patient alert and oriented, warm and dry, and in no acute distress. Patient denies SI, HI, AVH and pain. Patient made aware of Q15 minute rounds and Rover and Officer presence for their safety. Patient instructed to come to this nurse with needs or concerns.  

## 2022-03-17 NOTE — ED Notes (Signed)
VOL pending placement when covid clear

## 2022-03-17 NOTE — ED Notes (Signed)
Pt to restroom, does not flush toilet. Noted to have diarrhea

## 2022-03-17 NOTE — Consult Note (Signed)
[facility] Face-to-Face Psychiatry Consult   Reason for Consult:  paranoia, delusions Referring Physician:  EDP Patient Identification: Derrick Atkins MRN:  [id number] Principal Diagnosis: Schizophrenia, paranoid, chronic (HCC) Diagnosis:  Principal Problem:   Schizophrenia, paranoid, chronic (HCC)   Total Time spent with patient: 45 minutes  Subjective:   Derrick Atkins is a 51 y.o. male patient admitted with paranoia and delusions.  HPI:  51 yo male who presented to the ED with concerns from the community, agitated at times and causes issues in the community.  He has been to the ED frequently in the past couple of weeks.  His ACT team cannot manage him at this time.  He is volatile at  times in the ED with throwing clothes at staff,yelling, and cursing.  On assessment, he was told the plan to admit to an inpatient unit when he is COVID cleared.  He continues to have paranoia and delusions about people in his community, psych admissions needed, COVID day 5, mild cough at times.  Past Psychiatric History: schizophrenia  Risk to Self:  none Risk to Others:  yes Prior Inpatient Therapy:  multiple Prior Outpatient Therapy:  [facility]  Past Medical History:  Past Medical History:  Diagnosis Date   Cerebral palsy (HCC)    Depression    Diabetes mellitus without complication (HCC)    Hypertension    Kidney stones    Schizoaffective disorder (HCC)    Scoliosis     Past Surgical History:  Procedure Laterality Date   BOWEL RESECTION     LAPAROTOMY N/A 05/15/2020   Procedure: EXPLORATORY LAPAROTOMY;  Surgeon: Henrene Dodge, MD;  Location: [facility] ORS;  Service: General;  Laterality: N/A;   Family History:  Family History  Problem Relation Age of Onset   Heart disease Father    Heart attack Father    Family Psychiatric  History: see above Social History:  Social History   Substance and Sexual Activity  Alcohol Use No     Social History   Substance and Sexual Activity  Drug  Use No    Social History   Socioeconomic History   Marital status: Single    Spouse name: Not on file   Number of children: Not on file   Years of education: Not on file   Highest education level: Not on file  Occupational History   Not on file  Tobacco Use   Smoking status: Never   Smokeless tobacco: Never  Vaping Use   Vaping Use: Never used  Substance and Sexual Activity   Alcohol use: No   Drug use: No   Sexual activity: Not Currently  Other Topics Concern   Not on file  Social History Narrative   Not on file   Social Determinants of Health   Financial Resource Strain: Not on file  Food Insecurity: Not on file  Transportation Needs: Not on file  Physical Activity: Not on file  Stress: Not on file  Social Connections: Not on file   Additional Social History:    Allergies:   Allergies  Allergen Reactions   Phenytoin Sodium Extended Other (See Comments) and Nausea And Vomiting   Prednisone Other (See Comments)   Latex Hives, Nausea And Vomiting and Rash    Labs: No results found for this or any previous visit (from the past 48 hour(s)).  Current Facility-Administered Medications  Medication Dose Route Frequency Provider Last Rate Last Admin   aspirin EC tablet 81 mg  81 mg Oral Daily  Charm Rings, NP   81 mg at 03/17/22 0920   benztropine (COGENTIN) tablet 1 mg  1 mg Oral Daily Charm Rings, NP   1 mg at 03/17/22 0920   fenofibrate tablet 54 mg  54 mg Oral Daily Charm Rings, NP   54 mg at 03/17/22 6010   gemfibrozil (LOPID) tablet 600 mg  600 mg Oral BID Charm Rings, NP   600 mg at 03/17/22 9323   levothyroxine (SYNTHROID) tablet 88 mcg  88 mcg Oral Daily Charm Rings, NP   88 mcg at 03/17/22 5573   lisinopril (ZESTRIL) tablet 10 mg  10 mg Oral Daily Charm Rings, NP   10 mg at 03/17/22 2202   metFORMIN (GLUCOPHAGE) tablet 1,000 mg  1,000 mg Oral BID WC Charm Rings, NP   1,000 mg at 03/17/22 1719   pioglitazone (ACTOS) tablet 45 mg  45  mg Oral Daily Charm Rings, NP   45 mg at 03/17/22 5427   simvastatin (ZOCOR) tablet 20 mg  20 mg Oral QHS Charm Rings, NP   20 mg at 03/16/22 2231   temazepam (RESTORIL) capsule 15 mg  15 mg Oral QHS Charm Rings, NP   15 mg at 03/16/22 2231   Current Outpatient Medications  Medication Sig Dispense Refill   [START ON 03/18/2022] ARIPiprazole ER (ABILIFY MAINTENA) 400 MG SRER injection Inject 2 mLs (400 mg total) into the muscle every 28 (twenty-eight) days. Next dose due 03/18/22 1 each 1   aspirin EC 81 MG tablet Take 1 tablet (81 mg total) by mouth daily. Swallow whole. 30 tablet 1   benztropine (COGENTIN) 1 MG tablet Take 1 tablet (1 mg total) by mouth daily. (Patient not taking: Reported on 03/09/2022) 30 tablet 1   fenofibrate 54 MG tablet Take 1 tablet (54 mg total) by mouth daily. 30 tablet 1   gemfibrozil (LOPID) 600 MG tablet Take 1 tablet (600 mg total) by mouth 2 (two) times daily. 60 tablet 1   levothyroxine (SYNTHROID) 88 MCG tablet Take 1 tablet (88 mcg total) by mouth daily. 30 tablet 1   lisinopril (ZESTRIL) 10 MG tablet Take 10 mg by mouth daily.     metFORMIN (GLUCOPHAGE) 1000 MG tablet Take 1 tablet (1,000 mg total) by mouth 2 (two) times daily with a meal. 60 tablet 1   pioglitazone (ACTOS) 45 MG tablet Take 1 tablet (45 mg total) by mouth daily. 30 tablet 1   simvastatin (ZOCOR) 20 MG tablet Take 1 tablet (20 mg total) by mouth at bedtime. 30 tablet 1   temazepam (RESTORIL) 15 MG capsule Take 1 capsule (15 mg total) by mouth at bedtime. 30 capsule 1    Musculoskeletal: Strength & Muscle Tone: within normal limits Gait & Station: normal Patient leans: N/A  Psychiatric Specialty Exam: Physical Exam Vitals and nursing note reviewed.  Constitutional:      Appearance: Normal appearance.  HENT:     Head: Normocephalic.     Nose: Nose normal.  Pulmonary:     Effort: Pulmonary effort is normal.  Musculoskeletal:        General: Normal range of motion.      Cervical back: Normal range of motion.  Neurological:     General: No focal deficit present.     Mental Status: He is alert and oriented to person, place, and time.  Psychiatric:        Attention and Perception: Attention and perception normal.  Mood and Affect: Mood is anxious and depressed. Affect is blunt.        Speech: Speech normal.        Behavior: Behavior normal. Behavior is cooperative.        Thought Content: Thought content is paranoid and delusional.        Cognition and Memory: Cognition is impaired.        Judgment: Judgment is impulsive and inappropriate.     Review of Systems  Respiratory:  Positive for cough.   Psychiatric/Behavioral:  Positive for depression. The patient is nervous/anxious.   All other systems reviewed and are negative.   Blood pressure 114/78, pulse 97, temperature 99.3 F (37.4 C), resp. rate 16, height 6\' 4"  (1.93 m), weight 109 kg, SpO2 98 %.Body mass index is 29.25 kg/m.  General Appearance: Disheveled  Eye Contact:  Fair  Speech:  Normal Rate  Volume:  Normal  Mood:  Anxious, Depressed, and Irritable  Affect:  Blunt  Thought Process:  Coherent  Orientation:  Full (Time, Place, and Person)  Thought Content:  Delusions, Paranoid Ideation, and Rumination  Suicidal Thoughts:  No  Homicidal Thoughts:  No  Memory:  Immediate;   Fair Recent;   Fair Remote;   Fair  Judgement:  Poor  Insight:  Lacking  Psychomotor Activity:  Normal  Concentration:  Concentration: Fair and Attention Span: Fair  Recall:  of Knowledge:  Fair  Language:  Fair  Akathisia:  No  Handed:  Right  AIMS (if indicated):     Assets:  Housing Leisure Time Physical Health Resilience Social Support  ADL's:  Intact  Cognition:  Impaired,  Mild  Sleep:        Physical Exam: Physical Exam Vitals and nursing note reviewed.  Constitutional:      Appearance: Normal appearance.  HENT:     Head: Normocephalic.     Nose: Nose normal.  Pulmonary:      Effort: Pulmonary effort is normal.  Musculoskeletal:        General: Normal range of motion.     Cervical back: Normal range of motion.  Neurological:     General: No focal deficit present.     Mental Status: He is alert and oriented to person, place, and time.  Psychiatric:        Attention and Perception: Attention and perception normal.        Mood and Affect: Mood is anxious and depressed. Affect is blunt.        Speech: Speech normal.        Behavior: Behavior normal. Behavior is cooperative.        Thought Content: Thought content is paranoid and delusional.        Cognition and Memory: Cognition is impaired.        Judgment: Judgment is impulsive and inappropriate.    Review of Systems  Respiratory:  Positive for cough.   Psychiatric/Behavioral:  Positive for depression. The patient is nervous/anxious.   All other systems reviewed and are negative.  Blood pressure 114/78, pulse 97, temperature 99.3 F (37.4 C), resp. rate 16, height 6\' 4"  (1.93 m), weight 109 kg, SpO2 98 %. Body mass index is 29.25 kg/m.  Treatment Plan Summary: Daily contact with patient to assess and evaluate symptoms and progress in treatment, Medication management, and Plan : Schizophrenia, paranoid type: Risperdal 1 mg BID started  EPS: Cogentin 1 mg daily  Insomnia: Restoril 15 mg daily at bedtime  Disposition:  Recommend psychiatric Inpatient admission when medically cleared.  Nanine Means, NP 03/17/2022 5:21 PM

## 2022-03-17 NOTE — ED Notes (Signed)
VOL/Pending Placement when Covid clear

## 2022-03-17 NOTE — ED Notes (Signed)
Pt has generalized not feeling well and anxiety. Night time meds to be given and tylenol ordered by Dr. Derrill Kay

## 2022-03-18 DIAGNOSIS — F200 Paranoid schizophrenia: Secondary | ICD-10-CM | POA: Diagnosis not present

## 2022-03-18 LAB — CBC WITH DIFFERENTIAL/PLATELET
Abs Immature Granulocytes: 0.01 10*3/uL (ref 0.00–0.07)
Basophils Absolute: 0 10*3/uL (ref 0.0–0.1)
Basophils Relative: 0 %
Eosinophils Absolute: 0 10*3/uL (ref 0.0–0.5)
Eosinophils Relative: 1 %
HCT: 35.4 % — ABNORMAL LOW (ref 39.0–52.0)
Hemoglobin: 11.8 g/dL — ABNORMAL LOW (ref 13.0–17.0)
Immature Granulocytes: 0 %
Lymphocytes Relative: 21 %
Lymphs Abs: 1 10*3/uL (ref 0.7–4.0)
MCH: 26.7 pg (ref 26.0–34.0)
MCHC: 33.3 g/dL (ref 30.0–36.0)
MCV: 80.1 fL (ref 80.0–100.0)
Monocytes Absolute: 0.5 10*3/uL (ref 0.1–1.0)
Monocytes Relative: 11 %
Neutro Abs: 3.1 10*3/uL (ref 1.7–7.7)
Neutrophils Relative %: 67 %
Platelets: 144 10*3/uL — ABNORMAL LOW (ref 150–400)
RBC: 4.42 MIL/uL (ref 4.22–5.81)
RDW: 14.5 % (ref 11.5–15.5)
WBC: 4.7 10*3/uL (ref 4.0–10.5)
nRBC: 0 % (ref 0.0–0.2)

## 2022-03-18 LAB — PROTIME-INR
INR: 1.2 (ref 0.8–1.2)
Prothrombin Time: 15.1 seconds (ref 11.4–15.2)

## 2022-03-18 LAB — T4, FREE: Free T4: 0.94 ng/dL (ref 0.61–1.12)

## 2022-03-18 LAB — BASIC METABOLIC PANEL
Anion gap: 8 (ref 5–15)
BUN: 19 mg/dL (ref 6–20)
CO2: 20 mmol/L — ABNORMAL LOW (ref 22–32)
Calcium: 8.8 mg/dL — ABNORMAL LOW (ref 8.9–10.3)
Chloride: 108 mmol/L (ref 98–111)
Creatinine, Ser: 1.2 mg/dL (ref 0.61–1.24)
GFR, Estimated: >60 mL/min (ref >60)
Glucose, Bld: 119 mg/dL — ABNORMAL HIGH (ref 70–99)
Potassium: 3.6 mmol/L (ref 3.5–5.1)
Sodium: 136 mmol/L (ref 135–145)

## 2022-03-18 LAB — TSH: TSH: 2.718 u[IU]/mL (ref 0.350–4.500)

## 2022-03-18 MED ORDER — ACETAMINOPHEN 325 MG PO TABS
650.0000 mg | ORAL_TABLET | Freq: Four times a day (QID) | ORAL | Status: DC | PRN
  Administered 2022-03-18 – 2022-03-23 (×2): 650 mg via ORAL
  Filled 2022-03-18 (×2): qty 2

## 2022-03-18 MED ORDER — DROPERIDOL 2.5 MG/ML IJ SOLN: 5.0000 mg | Freq: Once | INTRAMUSCULAR | Status: DC

## 2022-03-18 MED ORDER — DROPERIDOL 2.5 MG/ML IJ SOLN
5.0000 mg | Freq: Once | INTRAMUSCULAR | Status: AC
  Administered 2022-03-18: 5 mg via INTRAMUSCULAR
  Filled 2022-03-18: qty 2

## 2022-03-18 NOTE — ED Notes (Signed)
Pt rearranging room, pt states he needs surgery but can't say why. Pt encouraged to rest on bed. Per EDP, encourage fluids, pt given milk and diet ginger ale to drink.

## 2022-03-18 NOTE — ED Notes (Signed)
Derrick Atkins Legal Guardian [phone]   [phone] Legal Guardian   Updated to patient having to be medicated due to aggression.

## 2022-03-18 NOTE — ED Notes (Signed)
Patient is currently asleep and lunch tray is at nursing station.
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
## 2022-03-18 NOTE — ED Notes (Signed)
Pt given drink,refused anything to eat. Pt allowed me to get pt vitals. Pt is content at the moment.

## 2022-03-18 NOTE — Consult Note (Signed)
[facility] Face-to-Face Psychiatry Consult   Reason for Consult:  paranoia, delusions Referring Physician:  EDP Patient Identification: Derrick Atkins MRN:  [id number] Principal Diagnosis: Schizophrenia, paranoid, chronic (HCC) Diagnosis:  Principal Problem:   Schizophrenia, paranoid, chronic (HCC)   Total Time spent with patient: 45 minutes  Subjective:   Derrick Atkins is a 51 y.o. male patient admitted with paranoia and delusions.  Client continues to be off his baseline with paranoia and delusions of persecution from his neighbors.  His behaviors have been stable for since yesterday with no acting out or cursing.  Psych placement continues when COVID free, Covid day 6.  HPI from 9/6:  51 yo male who presented to the ED with concerns from the community, agitated at times and causes issues in the community.  He has been to the ED frequently in the past couple of weeks.  His ACT team cannot manage him at this time.  He is volatile at  times in the ED with throwing clothes at staff,yelling, and cursing.  On assessment, he was told the plan to admit to an inpatient unit when he is COVID cleared.  He continues to have paranoia and delusions about people in his community, psych admissions needed, COVID day 5, mild cough at times.  Past Psychiatric History: schizophrenia  Risk to Self:  none Risk to Others:  yes Prior Inpatient Therapy:  multiple Prior Outpatient Therapy:  [facility]  Past Medical History:  Past Medical History:  Diagnosis Date   Cerebral palsy (HCC)    Depression    Diabetes mellitus without complication (HCC)    Hypertension    Kidney stones    Schizoaffective disorder (HCC)    Scoliosis     Past Surgical History:  Procedure Laterality Date   BOWEL RESECTION     LAPAROTOMY N/A 05/15/2020   Procedure: EXPLORATORY LAPAROTOMY;  Surgeon: Henrene Dodge, MD;  Location: [facility] ORS;  Service: General;  Laterality: N/A;   Family History:  Family History  Problem  Relation Age of Onset   Heart disease Father    Heart attack Father    Family Psychiatric  History: see above Social History:  Social History   Substance and Sexual Activity  Alcohol Use No     Social History   Substance and Sexual Activity  Drug Use No    Social History   Socioeconomic History   Marital status: Single    Spouse name: Not on file   Number of children: Not on file   Years of education: Not on file   Highest education level: Not on file  Occupational History   Not on file  Tobacco Use   Smoking status: Never   Smokeless tobacco: Never  Vaping Use   Vaping Use: Never used  Substance and Sexual Activity   Alcohol use: No   Drug use: No   Sexual activity: Not Currently  Other Topics Concern   Not on file  Social History Narrative   Not on file   Social Determinants of Health   Financial Resource Strain: Not on file  Food Insecurity: Not on file  Transportation Needs: Not on file  Physical Activity: Not on file  Stress: Not on file  Social Connections: Not on file   Additional Social History:    Allergies:   Allergies  Allergen Reactions   Phenytoin Sodium Extended Other (See Comments) and Nausea And Vomiting   Prednisone Other (See Comments)   Latex Hives, Nausea And Vomiting and Rash  Labs: No results found for this or any previous visit (from the past 48 hour(s)).  Current Facility-Administered Medications  Medication Dose Route Frequency Provider Last Rate Last Admin   acetaminophen (TYLENOL) tablet 650 mg  650 mg Oral Q6H PRN Pilar Jarvis, MD       aspirin EC tablet 81 mg  81 mg Oral Daily Charm Rings, NP   81 mg at 03/17/22 0920   benztropine (COGENTIN) tablet 1 mg  1 mg Oral Daily Charm Rings, NP   1 mg at 03/17/22 0920   fenofibrate tablet 54 mg  54 mg Oral Daily Charm Rings, NP   54 mg at 03/17/22 6213   gemfibrozil (LOPID) tablet 600 mg  600 mg Oral BID Charm Rings, NP   600 mg at 03/17/22 2043   levothyroxine  (SYNTHROID) tablet 88 mcg  88 mcg Oral Daily Charm Rings, NP   88 mcg at 03/17/22 0865   lisinopril (ZESTRIL) tablet 10 mg  10 mg Oral Daily Charm Rings, NP   10 mg at 03/17/22 7846   metFORMIN (GLUCOPHAGE) tablet 1,000 mg  1,000 mg Oral BID WC Charm Rings, NP   1,000 mg at 03/17/22 1719   pioglitazone (ACTOS) tablet 45 mg  45 mg Oral Daily Charm Rings, NP   45 mg at 03/17/22 9629   risperiDONE (RISPERDAL) tablet 1 mg  1 mg Oral BID Charm Rings, NP   1 mg at 03/17/22 2043   simvastatin (ZOCOR) tablet 20 mg  20 mg Oral QHS Charm Rings, NP   20 mg at 03/17/22 2042   temazepam (RESTORIL) capsule 15 mg  15 mg Oral QHS Charm Rings, NP   15 mg at 03/17/22 2043   Current Outpatient Medications  Medication Sig Dispense Refill   ARIPiprazole ER (ABILIFY MAINTENA) 400 MG SRER injection Inject 2 mLs (400 mg total) into the muscle every 28 (twenty-eight) days. Next dose due 03/18/22 1 each 1   aspirin EC 81 MG tablet Take 1 tablet (81 mg total) by mouth daily. Swallow whole. 30 tablet 1   benztropine (COGENTIN) 1 MG tablet Take 1 tablet (1 mg total) by mouth daily. (Patient not taking: Reported on 03/09/2022) 30 tablet 1   fenofibrate 54 MG tablet Take 1 tablet (54 mg total) by mouth daily. 30 tablet 1   gemfibrozil (LOPID) 600 MG tablet Take 1 tablet (600 mg total) by mouth 2 (two) times daily. 60 tablet 1   levothyroxine (SYNTHROID) 88 MCG tablet Take 1 tablet (88 mcg total) by mouth daily. 30 tablet 1   lisinopril (ZESTRIL) 10 MG tablet Take 10 mg by mouth daily.     metFORMIN (GLUCOPHAGE) 1000 MG tablet Take 1 tablet (1,000 mg total) by mouth 2 (two) times daily with a meal. 60 tablet 1   pioglitazone (ACTOS) 45 MG tablet Take 1 tablet (45 mg total) by mouth daily. 30 tablet 1   simvastatin (ZOCOR) 20 MG tablet Take 1 tablet (20 mg total) by mouth at bedtime. 30 tablet 1   temazepam (RESTORIL) 15 MG capsule Take 1 capsule (15 mg total) by mouth at bedtime. 30 capsule 1     Musculoskeletal: Strength & Muscle Tone: within normal limits Gait & Station: normal Patient leans: N/A  Psychiatric Specialty Exam: Physical Exam Vitals and nursing note reviewed.  Constitutional:      Appearance: Normal appearance.  HENT:     Head: Normocephalic.     Nose:  Nose normal.  Pulmonary:     Effort: Pulmonary effort is normal.  Musculoskeletal:        General: Normal range of motion.     Cervical back: Normal range of motion.  Neurological:     General: No focal deficit present.     Mental Status: He is alert and oriented to person, place, and time.  Psychiatric:        Attention and Perception: Attention and perception normal.        Mood and Affect: Mood is anxious and depressed. Affect is blunt.        Speech: Speech normal.        Behavior: Behavior normal. Behavior is cooperative.        Thought Content: Thought content is paranoid and delusional.        Cognition and Memory: Cognition is impaired.        Judgment: Judgment is impulsive and inappropriate.     Review of Systems  Respiratory:  Positive for cough.   Psychiatric/Behavioral:  Positive for depression. The patient is nervous/anxious.   All other systems reviewed and are negative.   Blood pressure 132/84, pulse (!) 128, temperature 99.2 F (37.3 C), temperature source Oral, resp. rate 18, height 6\' 4"  (1.93 m), weight 109 kg, SpO2 100 %.Body mass index is 29.25 kg/m.  General Appearance: Disheveled  Eye Contact:  Fair  Speech:  Normal Rate  Volume:  Normal  Mood:  Anxious, Depressed, and Irritable  Affect:  Blunt  Thought Process:  Coherent  Orientation:  Full (Time, Place, and Person)  Thought Content:  Delusions, Paranoid Ideation, and Rumination  Suicidal Thoughts:  No  Homicidal Thoughts:  No  Memory:  Immediate;   Fair Recent;   Fair Remote;   Fair  Judgement:  Poor  Insight:  Lacking  Psychomotor Activity:  Normal  Concentration:  Concentration: Fair and Attention Span:  Fair  Recall:  of Knowledge:  Fair  Language:  Fair  Akathisia:  No  Handed:  Right  AIMS (if indicated):     Assets:  Housing Leisure Time Physical Health Resilience Social Support  ADL's:  Intact  Cognition:  Impaired,  Mild  Sleep:        Physical Exam: Physical Exam Vitals and nursing note reviewed.  Constitutional:      Appearance: Normal appearance.  HENT:     Head: Normocephalic.     Nose: Nose normal.  Pulmonary:     Effort: Pulmonary effort is normal.  Musculoskeletal:        General: Normal range of motion.     Cervical back: Normal range of motion.  Neurological:     General: No focal deficit present.     Mental Status: He is alert and oriented to person, place, and time.  Psychiatric:        Attention and Perception: Attention and perception normal.        Mood and Affect: Mood is anxious and depressed. Affect is blunt.        Speech: Speech normal.        Behavior: Behavior normal. Behavior is cooperative.        Thought Content: Thought content is paranoid and delusional.        Cognition and Memory: Cognition is impaired.        Judgment: Judgment is impulsive and inappropriate.    Review of Systems  Respiratory:  Positive for cough.   Psychiatric/Behavioral:  Positive for  depression. The patient is nervous/anxious.   All other systems reviewed and are negative.  Blood pressure 132/84, pulse (!) 128, temperature 99.2 F (37.3 C), temperature source Oral, resp. rate 18, height 6\' 4"  (1.93 m), weight 109 kg, SpO2 100 %. Body mass index is 29.25 kg/m.  Treatment Plan Summary: Daily contact with patient to assess and evaluate symptoms and progress in treatment, Medication management, and Plan : Schizophrenia, paranoid type: Risperdal 1 mg BID s  EPS: Cogentin 1 mg daily  Insomnia: Restoril 15 mg daily at bedtime  Disposition: Recommend psychiatric Inpatient admission when medically cleared.  , NP 03/18/2022 9:57 AM

## 2022-03-18 NOTE — BH Assessment (Signed)
Referral information for Psychiatric Hospitalization faxed to;   [facility] ([phone]),  [facility] ([phone]),   [facility] ([phone] -or- [phone]),   [facility] ([phone])  [facility] ([phone])  [facility] ([phone]) 

## 2022-03-18 NOTE — ED Notes (Signed)
Pt walking in hall with only underwear on, pt redirected to room, pt pale with dark circles under his eyes. Pt VS done, EDP Stafford notified of VS. Pt redressed in new briefs and IVC clothes, pt helped to bed, room cleaned of trash.

## 2022-03-18 NOTE — ED Notes (Signed)
Hospital meal provided.  100% consumed, pt tolerated w/o complaints.  Waste discarded appropriately.   

## 2022-03-18 NOTE — ED Notes (Signed)
Pt compliantly sat down in room and allowed RN to given IM shot without any difficulties. Agreeable to repeat vitals signs and daily medications.

## 2022-03-18 NOTE — ED Provider Notes (Signed)
Emergency Medicine Observation Re-evaluation Note  Derrick Atkins is a 51 y.o. male, seen on rounds today.  Pt initially presented to the ED for complaints of Suicidal and Hand Pain  Currently, the patient is is no acute distress. Denies any concerns at this time.  Physical Exam  Blood pressure 125/66, pulse 96, temperature (!) 101 F (38.3 C), temperature source Oral, resp. rate 16, height 6\' 4"  (1.93 m), weight 109 kg, SpO2 92 %.  Physical Exam: General: No apparent distress Pulm: Normal WOB Neuro: Moving all extremities Psych: Resting comfortably     ED Course / MDM     I have reviewed the labs performed to date as well as medications administered while in observation.  Recent changes in the last 24 hours include: No acute events overnight.  Plan   Current plan: Patient awaiting psychiatric disposition.  Psychiatry recommends inpatient psychiatric treatment once COVID cleared. Patient is not under full IVC at this time.    Elisabetta Mishra, , DO 03/18/22 [phone]

## 2022-03-18 NOTE — ED Notes (Signed)
EDP notified of most recent vital signs.

## 2022-03-18 NOTE — ED Notes (Signed)
Pt walking out of room, pt reminded to stay in room, and to use bedside commode in room. Pt compliant.
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
## 2022-03-18 NOTE — ED Notes (Signed)
Breakfast and drink provided. 

## 2022-03-19 DIAGNOSIS — F200 Paranoid schizophrenia: Secondary | ICD-10-CM | POA: Diagnosis not present

## 2022-03-19 MED ORDER — ARIPIPRAZOLE 5 MG PO TABS
5.0000 mg | ORAL_TABLET | Freq: Every day | ORAL | Status: DC
  Administered 2022-03-19 – 2022-03-23 (×5): 5 mg via ORAL
  Filled 2022-03-19 (×6): qty 1

## 2022-03-19 MED ORDER — LORAZEPAM 2 MG/ML IJ SOLN
2.0000 mg | Freq: Once | INTRAMUSCULAR | Status: AC
  Administered 2022-03-19: 2 mg via INTRAMUSCULAR
  Filled 2022-03-19: qty 1

## 2022-03-19 MED ORDER — LORAZEPAM 2 MG PO TABS
2.0000 mg | ORAL_TABLET | Freq: Once | ORAL | Status: DC
  Filled 2022-03-19: qty 1

## 2022-03-19 MED ORDER — RISPERIDONE 1 MG PO TABS
2.0000 mg | ORAL_TABLET | Freq: Two times a day (BID) | ORAL | Status: DC
  Administered 2022-03-19 – 2022-03-23 (×9): 2 mg via ORAL
  Filled 2022-03-19 (×9): qty 2

## 2022-03-19 NOTE — ED Notes (Signed)
VOL/pending placement 

## 2022-03-19 NOTE — BH Assessment (Signed)
Referral information for Psychiatric Hospitalization faxed to;   [facility] ([phone]- [phone]),   [facility] ([phone]---[phone]),  [facility] ([phone], [phone], [phone] or [phone]),   [facility] ([phone]--- [phone]--- [phone]--- [phone])  [facility] ([phone]),   [facility] ([phone] -or- [phone]),   [facility] ([phone])  [facility] ([phone]).  [facility] ([phone]) 

## 2022-03-19 NOTE — ED Notes (Signed)
Breakfast given.  

## 2022-03-19 NOTE — BH Assessment (Addendum)
Referral checks;    [facility] ([phone]- [phone]), Denied due to "progressive behavior"   [facility] [phone]), No male beds available at this time   [facility] [phone], [phone], [phone] or [phone]),    [facility] [phone]--- [phone]--- [phone]--- [phone])   [facility] [phone]), Facility reports if patient has been accepted the facility will contact back   [facility] [phone] -[phone]), Facility denied due to COVID, this Clinical research associate explained to staff that patient has been in quarantine for 7 days but facility would like for the patient to be re-referred next week   [facility] [phone]) Spoke with Ranell Patrick and reports that he will call TTS back   [facility] [phone]).   [facility] [phone])

## 2022-03-19 NOTE — ED Notes (Signed)
Pt came out of his rm with no pants on and left blankets and clothing outside the rm. Writer and security told pt to go back in the rm and put pants on. Pt was handed new underwear and pants

## 2022-03-19 NOTE — ED Provider Notes (Signed)
Emergency Medicine Observation Re-evaluation Note  Derrick Atkins is a 51 y.o. male, seen on rounds today.  Pt initially presented to the ED for complaints of Suicidal and Hand Pain Currently, the patient is resting, voices no medical complaints.  Physical Exam  BP 117/60 (BP Location: Right Arm)   Pulse (!) 111   Temp 98.3 F (36.8 C) (Oral)   Resp 18   Ht 6\' 4"  (1.93 m)   Wt 109 kg   SpO2 96%   BMI 29.25 kg/m  Physical Exam General: Resting in no acute distress Cardiac: No cyanosis Lungs: Equal rise and fall Psych: Not agitated  ED Course / MDM  EKG:EKG Interpretation  Date/Time:  Monday March 15 2022 16:54:35 EDT Ventricular Rate:  89 PR Interval:  132 QRS Duration: 90 QT Interval:  392 QTC Calculation: 476 R Axis:   38 Text Interpretation: Normal sinus rhythm Possible Left atrial enlargement Borderline ECG When compared with ECG of 09-Mar-2022 13:46, No significant change was found Confirmed by UNCONFIRMED, DOCTOR (11-Mar-2022), editor [id number], Tammy [phone]) on 03/16/2022 12:40:26 PM  I have reviewed the labs performed to date as well as medications administered while in observation.  Recent changes in the last 24 hours include patient restless and agitated, took oral Ativan with good response.  Plan  Current plan is for psychiatric disposition.    05/16/2022, MD 03/19/22 [phone]

## 2022-03-19 NOTE — ED Notes (Addendum)
Pt had bowel movement all over floor and wall. Pt naked in room. Pt instructed to put clean clothes on and go into bathroom while this rn and tech, [place] clean room. New linens placed on bed.

## 2022-03-19 NOTE — ED Notes (Signed)
Pt had went to the bathroom and came out requesting new clothes writer handed over new pants and shirt. Pt then requested if I can turn on the lights I had asked security to stay near me due to not feeling safe near the pt. Writer tried turning on the lights and pt tried to grabs writers wrist. Security told pt to bad up and to not IT trainer. Light was turned on by security RN was notified of the incident

## 2022-03-19 NOTE — ED Notes (Signed)
Dinner given

## 2022-03-19 NOTE — ED Notes (Signed)
Snack and drink given 

## 2022-03-19 NOTE — Consult Note (Signed)
[facility] Face-to-Face Psychiatry Consult   Reason for Consult:  paranoia, delusions Referring Physician:  EDP Patient Identification: Derrick Atkins MRN:  [id number] Principal Diagnosis: Schizophrenia, paranoid, chronic (HCC) Diagnosis:  Principal Problem:   Schizophrenia, paranoid, chronic (HCC)   Total Time spent with patient: 45 minutes  Subjective:   Derrick Atkins is a 51 y.o. male patient admitted with paranoia and delusions.  Client continues to be off his baseline with paranoia and delusions of persecution from his neighbors.  His behaviors escalate at times with an occasional yell or loud noise.  He is taking his clothes off more frequently with redirection needed at times.  Medications adjusted to assist.  Psych placement continues when COVID free, Covid day 7.  HPI from 9/6:  51 yo male who presented to the ED with concerns from the community, agitated at times and causes issues in the community.  He has been to the ED frequently in the past couple of weeks.  His ACT team cannot manage him at this time.  He is volatile at  times in the ED with throwing clothes at staff,yelling, and cursing.  On assessment, he was told the plan to admit to an inpatient unit when he is COVID cleared.  He continues to have paranoia and delusions about people in his community, psych admissions needed, COVID day 5, mild cough at times.  Past Psychiatric History: schizophrenia  Risk to Self:  none Risk to Others:  yes Prior Inpatient Therapy:  multiple Prior Outpatient Therapy:  [facility]  Past Medical History:  Past Medical History:  Diagnosis Date   Cerebral palsy (HCC)    Depression    Diabetes mellitus without complication (HCC)    Hypertension    Kidney stones    Schizoaffective disorder (HCC)    Scoliosis     Past Surgical History:  Procedure Laterality Date   BOWEL RESECTION     LAPAROTOMY N/A 05/15/2020   Procedure: EXPLORATORY LAPAROTOMY;  Surgeon: Henrene Dodge, MD;  Location:  [facility] ORS;  Service: General;  Laterality: N/A;   Family History:  Family History  Problem Relation Age of Onset   Heart disease Father    Heart attack Father    Family Psychiatric  History: see above Social History:  Social History   Substance and Sexual Activity  Alcohol Use No     Social History   Substance and Sexual Activity  Drug Use No    Social History   Socioeconomic History   Marital status: Single    Spouse name: Not on file   Number of children: Not on file   Years of education: Not on file   Highest education level: Not on file  Occupational History   Not on file  Tobacco Use   Smoking status: Never   Smokeless tobacco: Never  Vaping Use   Vaping Use: Never used  Substance and Sexual Activity   Alcohol use: No   Drug use: No   Sexual activity: Not Currently  Other Topics Concern   Not on file  Social History Narrative   Not on file   Social Determinants of Health   Financial Resource Strain: Not on file  Food Insecurity: Not on file  Transportation Needs: Not on file  Physical Activity: Not on file  Stress: Not on file  Social Connections: Not on file   Additional Social History:    Allergies:   Allergies  Allergen Reactions   Phenytoin Sodium Extended Other (See Comments) and Nausea  And Vomiting   Prednisone Other (See Comments)   Latex Hives, Nausea And Vomiting and Rash    Labs:  Results for orders placed or performed during the hospital encounter of 03/15/22 (from the past 48 hour(s))  Protime-INR     Status: None   Collection Time: 03/18/22  8:18 PM  Result Value Ref Range   Prothrombin Time 15.1 11.4 - 15.2 seconds   INR 1.2 0.8 - 1.2    Comment: (NOTE) INR goal varies based on device and disease states. Performed at [facility], [address] [place], [place] [address]   Basic metabolic panel     Status: Abnormal   Collection Time: 03/18/22  8:18 PM  Result Value Ref Range   Sodium 136 135 - 145 mmol/L    Potassium 3.6 3.5 - 5.1 mmol/L   Chloride 108 98 - 111 mmol/L   CO2 20 (L) 22 - 32 mmol/L   Glucose, Bld 119 (H) 70 - 99 mg/dL    Comment: Glucose reference range applies only to samples taken after fasting for at least 8 hours.   BUN 19 6 - 20 mg/dL   Creatinine, Ser 8.67 0.61 - 1.24 mg/dL   Calcium 8.8 (L) 8.9 - 10.3 mg/dL   GFR, Estimated >54 >49 mL/min    Comment: (NOTE) Calculated using the CKD-EPI Creatinine Equation (2021)    Anion gap 8 5 - 15    Comment: Performed at [facility], [address] [place], [place] [address]  CBC with Differential/Platelet     Status: Abnormal   Collection Time: 03/18/22  8:18 PM  Result Value Ref Range   WBC 4.7 4.0 - 10.5 K/uL   RBC 4.42 4.22 - 5.81 MIL/uL   Hemoglobin 11.8 (L) 13.0 - 17.0 g/dL   HCT 71.2 (L) 19.7 - 58.8 %   MCV 80.1 80.0 - 100.0 fL   MCH 26.7 26.0 - 34.0 pg   MCHC 33.3 30.0 - 36.0 g/dL   RDW 32.5 49.8 - 26.4 %   Platelets 144 (L) 150 - 400 K/uL   nRBC 0.0 0.0 - 0.2 %   Neutrophils Relative % 67 %   Neutro Abs 3.1 1.7 - 7.7 K/uL   Lymphocytes Relative 21 %   Lymphs Abs 1.0 0.7 - 4.0 K/uL   Monocytes Relative 11 %   Monocytes Absolute 0.5 0.1 - 1.0 K/uL   Eosinophils Relative 1 %   Eosinophils Absolute 0.0 0.0 - 0.5 K/uL   Basophils Relative 0 %   Basophils Absolute 0.0 0.0 - 0.1 K/uL   Immature Granulocytes 0 %   Abs Immature Granulocytes 0.01 0.00 - 0.07 K/uL    Comment: Performed at [facility], [address] [place], [place] [address]  T4, free     Status: None   Collection Time: 03/18/22  8:18 PM  Result Value Ref Range   Free T4 0.94 0.61 - 1.12 ng/dL    Comment: (NOTE) Biotin ingestion may interfere with free T4 tests. If the results are inconsistent with the TSH level, previous test results, or the clinical presentation, then consider biotin interference. If needed, order repeat testing after stopping biotin. Performed at [facility], [address]  [place], [place] [address]   TSH     Status: None   Collection Time: 03/18/22  8:18 PM  Result Value Ref Range   TSH 2.718 0.350 - 4.500 uIU/mL    Comment: Performed by a 3rd Generation assay with a  functional sensitivity of <=0.01 uIU/mL. Performed at [facility], [address] [place], [place] [address]     Current Facility-Administered Medications  Medication Dose Route Frequency Provider Last Rate Last Admin   acetaminophen (TYLENOL) tablet 650 mg  650 mg Oral Q6H PRN Pilar JarvisWong, Silas, MD   650 mg at 03/18/22 1058   aspirin EC tablet 81 mg  81 mg Oral Daily Charm RingsLord, Raine Blodgett Y, NP   81 mg at 03/18/22 1057   benztropine (COGENTIN) tablet 1 mg  1 mg Oral Daily Charm RingsLord, Alitza Cowman Y, NP   1 mg at 03/18/22 1056   fenofibrate tablet 54 mg  54 mg Oral Daily Charm RingsLord, Yecheskel Kurek Y, NP   54 mg at 03/18/22 1057   gemfibrozil (LOPID) tablet 600 mg  600 mg Oral BID Charm RingsLord, Noal Abshier Y, NP   600 mg at 03/18/22 2156   levothyroxine (SYNTHROID) tablet 88 mcg  88 mcg Oral Daily Charm RingsLord, Lahoma Constantin Y, NP   88 mcg at 03/18/22 1057   lisinopril (ZESTRIL) tablet 10 mg  10 mg Oral Daily Charm RingsLord, Jadalee Westcott Y, NP   10 mg at 03/18/22 1057   LORazepam (ATIVAN) tablet 2 mg  2 mg Oral Once Sung, Jade J, MD       metFORMIN (GLUCOPHAGE) tablet 1,000 mg  1,000 mg Oral BID WC Charm RingsLord, Lamari Beckles Y, NP   1,000 mg at 03/18/22 1614   pioglitazone (ACTOS) tablet 45 mg  45 mg Oral Daily Charm RingsLord, Bracy Pepper Y, NP   45 mg at 03/18/22 1056   risperiDONE (RISPERDAL) tablet 1 mg  1 mg Oral BID Charm RingsLord, Marabelle Cushman Y, NP   1 mg at 03/18/22 2156   simvastatin (ZOCOR) tablet 20 mg  20 mg Oral QHS Charm RingsLord, Tenasia Aull Y, NP   20 mg at 03/18/22 2155   temazepam (RESTORIL) capsule 15 mg  15 mg Oral QHS Charm RingsLord, Geneveive Furness Y, NP   15 mg at 03/18/22 2155   Current Outpatient Medications  Medication Sig Dispense Refill   ARIPiprazole ER (ABILIFY MAINTENA) 400 MG SRER injection Inject 2 mLs (400 mg total) into the muscle every 28 (twenty-eight) days. Next dose due 03/18/22 1 each 1   aspirin  EC 81 MG tablet Take 1 tablet (81 mg total) by mouth daily. Swallow whole. 30 tablet 1   benztropine (COGENTIN) 1 MG tablet Take 1 tablet (1 mg total) by mouth daily. (Patient not taking: Reported on 03/09/2022) 30 tablet 1   fenofibrate 54 MG tablet Take 1 tablet (54 mg total) by mouth daily. 30 tablet 1   gemfibrozil (LOPID) 600 MG tablet Take 1 tablet (600 mg total) by mouth 2 (two) times daily. 60 tablet 1   levothyroxine (SYNTHROID) 88 MCG tablet Take 1 tablet (88 mcg total) by mouth daily. 30 tablet 1   lisinopril (ZESTRIL) 10 MG tablet Take 10 mg by mouth daily.     metFORMIN (GLUCOPHAGE) 1000 MG tablet Take 1 tablet (1,000 mg total) by mouth 2 (two) times daily with a meal. 60 tablet 1   pioglitazone (ACTOS) 45 MG tablet Take 1 tablet (45 mg total) by mouth daily. 30 tablet 1   simvastatin (ZOCOR) 20 MG tablet Take 1 tablet (20 mg total) by mouth at bedtime. 30 tablet 1   temazepam (RESTORIL) 15 MG capsule Take 1 capsule (15 mg total) by mouth at bedtime. 30 capsule 1    Musculoskeletal: Strength & Muscle Tone: within normal limits Gait & Station: normal Patient leans: N/A  Psychiatric Specialty Exam: Physical Exam Vitals  and nursing note reviewed.  Constitutional:      Appearance: Normal appearance.  HENT:     Head: Normocephalic.     Nose: Nose normal.  Pulmonary:     Effort: Pulmonary effort is normal.  Musculoskeletal:        General: Normal range of motion.     Cervical back: Normal range of motion.  Neurological:     General: No focal deficit present.     Mental Status: He is alert and oriented to person, place, and time.  Psychiatric:        Attention and Perception: Attention and perception normal.        Mood and Affect: Mood is anxious and depressed. Affect is blunt.        Speech: Speech normal.        Behavior: Behavior normal. Behavior is cooperative.        Thought Content: Thought content is paranoid and delusional.        Cognition and Memory: Cognition  is impaired.        Judgment: Judgment is impulsive and inappropriate.     Review of Systems  Respiratory:  Positive for cough.   Psychiatric/Behavioral:  Positive for depression. The patient is nervous/anxious.   All other systems reviewed and are negative.   Blood pressure 117/60, pulse (!) 111, temperature 98.3 F (36.8 C), temperature source Oral, resp. rate 18, height 6\' 4"  (1.93 m), weight 109 kg, SpO2 96 %.Body mass index is 29.25 kg/m.  General Appearance: Disheveled  Eye Contact:  Fair  Speech:  Normal Rate  Volume:  Normal  Mood:  Anxious, Depressed, and Irritable  Affect:  Blunt  Thought Process:  Coherent  Orientation:  Full (Time, Place, and Person)  Thought Content:  Delusions, Paranoid Ideation, and Rumination  Suicidal Thoughts:  No  Homicidal Thoughts:  No  Memory:  Immediate;   Fair Recent;   Fair Remote;   Fair  Judgement:  Poor  Insight:  Lacking  Psychomotor Activity:  Normal  Concentration:  Concentration: Fair and Attention Span: Fair  Recall:  of Knowledge:  Fair  Language:  Fair  Akathisia:  No  Handed:  Right  AIMS (if indicated):     Assets:  Housing Leisure Time Physical Health Resilience Social Support  ADL's:  Intact  Cognition:  Impaired,  Mild  Sleep:        Physical Exam: Physical Exam Vitals and nursing note reviewed.  Constitutional:      Appearance: Normal appearance.  HENT:     Head: Normocephalic.     Nose: Nose normal.  Pulmonary:     Effort: Pulmonary effort is normal.  Musculoskeletal:        General: Normal range of motion.     Cervical back: Normal range of motion.  Neurological:     General: No focal deficit present.     Mental Status: He is alert and oriented to person, place, and time.  Psychiatric:        Attention and Perception: Attention and perception normal.        Mood and Affect: Mood is anxious and depressed. Affect is blunt.        Speech: Speech normal.        Behavior: Behavior  normal. Behavior is cooperative.        Thought Content: Thought content is paranoid and delusional.        Cognition and Memory: Cognition is impaired.  Judgment: Judgment is impulsive and inappropriate.    Review of Systems  Respiratory:  Positive for cough.   Psychiatric/Behavioral:  Positive for depression. The patient is nervous/anxious.   All other systems reviewed and are negative.  Blood pressure 117/60, pulse (!) 111, temperature 98.3 F (36.8 C), temperature source Oral, resp. rate 18, height 6\' 4"  (1.93 m), weight 109 kg, SpO2 96 %. Body mass index is 29.25 kg/m.  Treatment Plan Summary: Daily contact with patient to assess and evaluate symptoms and progress in treatment, Medication management, and Plan : Schizophrenia, paranoid type: Risperdal 1 mg BID increased to 2 mg BID Started Abilify 5 mg daily  EPS: Cogentin 1 mg daily  Insomnia: Restoril 15 mg daily at bedtime  Disposition: Recommend psychiatric Inpatient admission when medically cleared.  , NP 03/19/2022 10:00 AM

## 2022-03-20 DIAGNOSIS — F209 Schizophrenia, unspecified: Secondary | ICD-10-CM | POA: Diagnosis not present

## 2022-03-20 DIAGNOSIS — F200 Paranoid schizophrenia: Secondary | ICD-10-CM | POA: Diagnosis not present

## 2022-03-20 NOTE — ED Notes (Signed)
Patient is vol pending placement 

## 2022-03-20 NOTE — ED Notes (Signed)
Hospital meal provided, pt tolerated w/o complaints.  Waste discarded appropriately.  

## 2022-03-20 NOTE — Consult Note (Signed)
[facility] Face-to-Face Psychiatry Consult   Reason for Consult:  paranoia, delusions Referring Physician:  EDP Patient Identification: Derrick Atkins MRN:  [id number] Principal Diagnosis: Schizophrenia, paranoid, chronic (HCC) Diagnosis:  Principal Problem:   Schizophrenia, paranoid, chronic (HCC)   Total Time spent with patient: 45 minutes  Subjective:   Derrick Atkins is a 51 y.o. male patient admitted with paranoia and delusions.  Client continues to be off his baseline with paranoia and delusions of persecution from his neighbors.  He spends his time watching television with an occasional yell, denies any physical discomforts.  Psych placement continues when COVID free, Covid day 9.  HPI from 9/6:  51 yo male who presented to the ED with concerns from the community, agitated at times and causes issues in the community.  He has been to the ED frequently in the past couple of weeks.  His ACT team cannot manage him at this time.  He is volatile at  times in the ED with throwing clothes at staff,yelling, and cursing.  On assessment, he was told the plan to admit to an inpatient unit when he is COVID cleared.  He continues to have paranoia and delusions about people in his community, psych admissions needed, COVID day 5, mild cough at times.  Past Psychiatric History: schizophrenia  Risk to Self:  none Risk to Others:  yes Prior Inpatient Therapy:  multiple Prior Outpatient Therapy:  [facility]  Past Medical History:  Past Medical History:  Diagnosis Date   Cerebral palsy (HCC)    Depression    Diabetes mellitus without complication (HCC)    Hypertension    Kidney stones    Schizoaffective disorder (HCC)    Scoliosis     Past Surgical History:  Procedure Laterality Date   BOWEL RESECTION     LAPAROTOMY N/A 05/15/2020   Procedure: EXPLORATORY LAPAROTOMY;  Surgeon: Henrene Dodge, MD;  Location: [facility] ORS;  Service: General;  Laterality: N/A;   Family History:  Family History   Problem Relation Age of Onset   Heart disease Father    Heart attack Father    Family Psychiatric  History: see above Social History:  Social History   Substance and Sexual Activity  Alcohol Use No     Social History   Substance and Sexual Activity  Drug Use No    Social History   Socioeconomic History   Marital status: Single    Spouse name: Not on file   Number of children: Not on file   Years of education: Not on file   Highest education level: Not on file  Occupational History   Not on file  Tobacco Use   Smoking status: Never   Smokeless tobacco: Never  Vaping Use   Vaping Use: Never used  Substance and Sexual Activity   Alcohol use: No   Drug use: No   Sexual activity: Not Currently  Other Topics Concern   Not on file  Social History Narrative   Not on file   Social Determinants of Health   Financial Resource Strain: Not on file  Food Insecurity: Not on file  Transportation Needs: Not on file  Physical Activity: Not on file  Stress: Not on file  Social Connections: Not on file   Additional Social History:    Allergies:   Allergies  Allergen Reactions   Phenytoin Sodium Extended Other (See Comments) and Nausea And Vomiting   Prednisone Other (See Comments)   Latex Hives, Nausea And Vomiting and Rash  Labs:  Results for orders placed or performed during the hospital encounter of 03/15/22 (from the past 48 hour(s))  Protime-INR     Status: None   Collection Time: 03/18/22  8:18 PM  Result Value Ref Range   Prothrombin Time 15.1 11.4 - 15.2 seconds   INR 1.2 0.8 - 1.2    Comment: (NOTE) INR goal varies based on device and disease states. Performed at [facility], [address] [place], [place] [address]   Basic metabolic panel     Status: Abnormal   Collection Time: 03/18/22  8:18 PM  Result Value Ref Range   Sodium 136 135 - 145 mmol/L   Potassium 3.6 3.5 - 5.1 mmol/L   Chloride 108 98 - 111 mmol/L   CO2 20 (L) 22 - 32  mmol/L   Glucose, Bld 119 (H) 70 - 99 mg/dL    Comment: Glucose reference range applies only to samples taken after fasting for at least 8 hours.   BUN 19 6 - 20 mg/dL   Creatinine, Ser 6.04 0.61 - 1.24 mg/dL   Calcium 8.8 (L) 8.9 - 10.3 mg/dL   GFR, Estimated >54 >09 mL/min    Comment: (NOTE) Calculated using the CKD-EPI Creatinine Equation (2021)    Anion gap 8 5 - 15    Comment: Performed at [facility], [address] [place], [place] [address]  CBC with Differential/Platelet     Status: Abnormal   Collection Time: 03/18/22  8:18 PM  Result Value Ref Range   WBC 4.7 4.0 - 10.5 K/uL   RBC 4.42 4.22 - 5.81 MIL/uL   Hemoglobin 11.8 (L) 13.0 - 17.0 g/dL   HCT 47.8 (L) 29.5 - 62.1 %   MCV 80.1 80.0 - 100.0 fL   MCH 26.7 26.0 - 34.0 pg   MCHC 33.3 30.0 - 36.0 g/dL   RDW 30.8 65.7 - 84.6 %   Platelets 144 (L) 150 - 400 K/uL   nRBC 0.0 0.0 - 0.2 %   Neutrophils Relative % 67 %   Neutro Abs 3.1 1.7 - 7.7 K/uL   Lymphocytes Relative 21 %   Lymphs Abs 1.0 0.7 - 4.0 K/uL   Monocytes Relative 11 %   Monocytes Absolute 0.5 0.1 - 1.0 K/uL   Eosinophils Relative 1 %   Eosinophils Absolute 0.0 0.0 - 0.5 K/uL   Basophils Relative 0 %   Basophils Absolute 0.0 0.0 - 0.1 K/uL   Immature Granulocytes 0 %   Abs Immature Granulocytes 0.01 0.00 - 0.07 K/uL    Comment: Performed at [facility], [address] [place], [place] [address]  T4, free     Status: None   Collection Time: 03/18/22  8:18 PM  Result Value Ref Range   Free T4 0.94 0.61 - 1.12 ng/dL    Comment: (NOTE) Biotin ingestion may interfere with free T4 tests. If the results are inconsistent with the TSH level, previous test results, or the clinical presentation, then consider biotin interference. If needed, order repeat testing after stopping biotin. Performed at [facility], [address] [name], [place] [address]   TSH     Status: None   Collection Time: 03/18/22  8:18 PM  Result  Value Ref Range   TSH 2.718 0.350 - 4.500 uIU/mL    Comment: Performed by a 3rd Generation assay with a functional sensitivity of <=0.01 uIU/mL. Performed at [facility], [address], [place], [place] [address]  Current Facility-Administered Medications  Medication Dose Route Frequency Provider Last Rate Last Admin   acetaminophen (TYLENOL) tablet 650 mg  650 mg Oral Q6H PRN Pilar Jarvis, MD   650 mg at 03/18/22 1058   ARIPiprazole (ABILIFY) tablet 5 mg  5 mg Oral Daily Charm Rings, NP   5 mg at 03/20/22 3354   aspirin EC tablet 81 mg  81 mg Oral Daily Charm Rings, NP   81 mg at 03/20/22 0908   benztropine (COGENTIN) tablet 1 mg  1 mg Oral Daily Charm Rings, NP   1 mg at 03/20/22 5625   fenofibrate tablet 54 mg  54 mg Oral Daily Charm Rings, NP   54 mg at 03/20/22 0909   gemfibrozil (LOPID) tablet 600 mg  600 mg Oral BID Charm Rings, NP   600 mg at 03/20/22 0908   levothyroxine (SYNTHROID) tablet 88 mcg  88 mcg Oral Daily Charm Rings, NP   88 mcg at 03/20/22 0909   lisinopril (ZESTRIL) tablet 10 mg  10 mg Oral Daily Charm Rings, NP   10 mg at 03/20/22 0908   LORazepam (ATIVAN) tablet 2 mg  2 mg Oral Once Sung, Jade J, MD       metFORMIN (GLUCOPHAGE) tablet 1,000 mg  1,000 mg Oral BID WC Charm Rings, NP   1,000 mg at 03/20/22 0908   pioglitazone (ACTOS) tablet 45 mg  45 mg Oral Daily Charm Rings, NP   45 mg at 03/20/22 0908   risperiDONE (RISPERDAL) tablet 2 mg  2 mg Oral BID Charm Rings, NP   2 mg at 03/20/22 0908   simvastatin (ZOCOR) tablet 20 mg  20 mg Oral QHS Charm Rings, NP   20 mg at 03/19/22 2131   temazepam (RESTORIL) capsule 15 mg  15 mg Oral QHS Charm Rings, NP   15 mg at 03/19/22 2131   Current Outpatient Medications  Medication Sig Dispense Refill   ARIPiprazole ER (ABILIFY MAINTENA) 400 MG SRER injection Inject 2 mLs (400 mg total) into the muscle every 28 (twenty-eight) days. Next dose due 03/18/22 1 each 1    aspirin EC 81 MG tablet Take 1 tablet (81 mg total) by mouth daily. Swallow whole. 30 tablet 1   benztropine (COGENTIN) 1 MG tablet Take 1 tablet (1 mg total) by mouth daily. (Patient not taking: Reported on 03/09/2022) 30 tablet 1   fenofibrate 54 MG tablet Take 1 tablet (54 mg total) by mouth daily. 30 tablet 1   gemfibrozil (LOPID) 600 MG tablet Take 1 tablet (600 mg total) by mouth 2 (two) times daily. 60 tablet 1   levothyroxine (SYNTHROID) 88 MCG tablet Take 1 tablet (88 mcg total) by mouth daily. 30 tablet 1   lisinopril (ZESTRIL) 10 MG tablet Take 10 mg by mouth daily.     metFORMIN (GLUCOPHAGE) 1000 MG tablet Take 1 tablet (1,000 mg total) by mouth 2 (two) times daily with a meal. 60 tablet 1   pioglitazone (ACTOS) 45 MG tablet Take 1 tablet (45 mg total) by mouth daily. 30 tablet 1   simvastatin (ZOCOR) 20 MG tablet Take 1 tablet (20 mg total) by mouth at bedtime. 30 tablet 1   temazepam (RESTORIL) 15 MG capsule Take 1 capsule (15 mg total) by mouth at bedtime. 30 capsule 1    Musculoskeletal: Strength & Muscle Tone: within normal limits Gait & Station: normal Patient leans: N/A  Psychiatric Specialty Exam: Physical  Exam Vitals and nursing note reviewed.  Constitutional:      Appearance: Normal appearance.  HENT:     Head: Normocephalic.     Nose: Nose normal.  Pulmonary:     Effort: Pulmonary effort is normal.  Musculoskeletal:        General: Normal range of motion.     Cervical back: Normal range of motion.  Neurological:     General: No focal deficit present.     Mental Status: He is alert and oriented to person, place, and time.  Psychiatric:        Attention and Perception: Attention and perception normal.        Mood and Affect: Mood is anxious and depressed. Affect is blunt.        Speech: Speech normal.        Behavior: Behavior normal. Behavior is cooperative.        Thought Content: Thought content is paranoid and delusional.        Cognition and Memory:  Cognition is impaired.        Judgment: Judgment is impulsive and inappropriate.     Review of Systems  Psychiatric/Behavioral:  Positive for depression. The patient is nervous/anxious.   All other systems reviewed and are negative.   Blood pressure 126/74, pulse (!) 102, temperature 97.9 F (36.6 C), temperature source Oral, resp. rate 18, height 6\' 4"  (1.93 m), weight 109 kg, SpO2 96 %.Body mass index is 29.25 kg/m.  General Appearance: Disheveled  Eye Contact:  Fair  Speech:  Normal Rate  Volume:  Normal  Mood:  Anxious, Depressed, and Irritable  Affect:  Blunt  Thought Process:  Coherent  Orientation:  Full (Time, Place, and Person)  Thought Content:  Delusions, Paranoid Ideation, and Rumination  Suicidal Thoughts:  No  Homicidal Thoughts:  No  Memory:  Immediate;   Fair Recent;   Fair Remote;   Fair  Judgement:  Poor  Insight:  Lacking  Psychomotor Activity:  Normal  Concentration:  Concentration: Fair and Attention Span: Fair  Recall:  [employer]  Fund of Knowledge:  Fair  Language:  Fair  Akathisia:  No  Handed:  Right  AIMS (if indicated):     Assets:  Housing Leisure Time Physical Health Resilience Social Support  ADL's:  Intact  Cognition:  Impaired,  Mild  Sleep:        Physical Exam: Physical Exam Vitals and nursing note reviewed.  Constitutional:      Appearance: Normal appearance.  HENT:     Head: Normocephalic.     Nose: Nose normal.  Pulmonary:     Effort: Pulmonary effort is normal.  Musculoskeletal:        General: Normal range of motion.     Cervical back: Normal range of motion.  Neurological:     General: No focal deficit present.     Mental Status: He is alert and oriented to person, place, and time.  Psychiatric:        Attention and Perception: Attention and perception normal.        Mood and Affect: Mood is anxious and depressed. Affect is blunt.        Speech: Speech normal.        Behavior: Behavior normal. Behavior is cooperative.         Thought Content: Thought content is paranoid and delusional.        Cognition and Memory: Cognition is impaired.        Judgment:  Judgment is impulsive and inappropriate.    Review of Systems  Psychiatric/Behavioral:  Positive for depression. The patient is nervous/anxious.   All other systems reviewed and are negative.  Blood pressure 126/74, pulse (!) 102, temperature 97.9 F (36.6 C), temperature source Oral, resp. rate 18, height 6\' 4"  (1.93 m), weight 109 kg, SpO2 96 %. Body mass index is 29.25 kg/m.  Treatment Plan Summary: Daily contact with patient to assess and evaluate symptoms and progress in treatment, Medication management, and Plan : Schizophrenia, paranoid type: Risperdal 1 mg BID increased to 2 mg BID Abilify 5 mg daily  EPS: Cogentin 1 mg daily  Insomnia: Restoril 15 mg daily at bedtime  Disposition: Recommend psychiatric Inpatient admission when medically cleared.  , NP 03/20/2022 10:06 AM

## 2022-03-20 NOTE — ED Notes (Signed)
Pt given nighttime snack. 

## 2022-03-20 NOTE — ED Notes (Signed)
BREAKFAST TRAY GIVEN 

## 2022-03-20 NOTE — ED Notes (Signed)
Vol /pending placement 

## 2022-03-20 NOTE — ED Provider Notes (Signed)
Emergency Medicine Observation Re-evaluation Note  Derrick Atkins is a 51 y.o. male, seen on rounds today.  Pt initially presented to the ED for complaints of Suicidal and Hand Pain  Currently, the patient is is no acute distress. Denies any concerns at this time.  Physical Exam  Blood pressure 125/70, pulse (!) 101, temperature 98.4 F (36.9 C), temperature source Oral, resp. rate 16, height 6\' 4"  (1.93 m), weight 109 kg, SpO2 100 %.  Physical Exam: General: No apparent distress Pulm: Normal WOB Neuro: Moving all extremities Psych: Resting comfortably     ED Course / MDM     I have reviewed the labs performed to date as well as medications administered while in observation.  Recent changes in the last 24 hours include: No acute events overnight.  Plan   Current plan: Patient awaiting psychiatric disposition. Patient is not under full IVC at this time.    Derrick Atkins, , DO 03/20/22 [phone]

## 2022-03-21 DIAGNOSIS — F200 Paranoid schizophrenia: Secondary | ICD-10-CM | POA: Diagnosis not present

## 2022-03-21 NOTE — ED Notes (Signed)
Pt clean and dressed in the clean scrubs provided and back in room. Bathroom cleaned by this tech.

## 2022-03-21 NOTE — ED Notes (Signed)
Hospital meal provided, pt tolerated w/o complaints.  Waste discarded appropriately.  

## 2022-03-21 NOTE — ED Notes (Signed)
Drink, sand tray, icecream provided

## 2022-03-21 NOTE — ED Notes (Signed)
Pt given breakfast tray and drink at this time. 

## 2022-03-21 NOTE — Consult Note (Signed)
[facility] Face-to-Face Psychiatry Consult   Reason for Consult:  paranoia, delusions Referring Physician:  EDP Patient Identification: ABUNDIO TEUSCHER MRN:  [id number] Principal Diagnosis: Schizophrenia, paranoid, chronic (HCC) Diagnosis:  Principal Problem:   Schizophrenia, paranoid, chronic (HCC)   Total Time spent with patient: 45 minutes  Subjective:   BARAN KUHRT is a 51 y.o. male patient admitted with paranoia and delusions.  Client continues to be off his baseline with paranoia and delusions of persecution from his neighbors.  He spends his time watching television with an occasional yell, denies any physical discomforts.  Psych placement continues now that he is ten days from being COVID positive with no current symptoms, off quarantine.  HPI from 9/6:  51 yo male who presented to the ED with concerns from the community, agitated at times and causes issues in the community.  He has been to the ED frequently in the past couple of weeks.  His ACT team cannot manage him at this time.  He is volatile at  times in the ED with throwing clothes at staff,yelling, and cursing.  On assessment, he was told the plan to admit to an inpatient unit when he is COVID cleared.  He continues to have paranoia and delusions about people in his community, psych admissions needed, COVID day 5, mild cough at times.  Past Psychiatric History: schizophrenia  Risk to Self:  none Risk to Others:  yes Prior Inpatient Therapy:  multiple Prior Outpatient Therapy:  [facility]  Past Medical History:  Past Medical History:  Diagnosis Date   Cerebral palsy (HCC)    Depression    Diabetes mellitus without complication (HCC)    Hypertension    Kidney stones    Schizoaffective disorder (HCC)    Scoliosis     Past Surgical History:  Procedure Laterality Date   BOWEL RESECTION     LAPAROTOMY N/A 05/15/2020   Procedure: EXPLORATORY LAPAROTOMY;  Surgeon: Henrene Dodge, MD;  Location: [facility] ORS;  Service:  General;  Laterality: N/A;   Family History:  Family History  Problem Relation Age of Onset   Heart disease Father    Heart attack Father    Family Psychiatric  History: see above Social History:  Social History   Substance and Sexual Activity  Alcohol Use No     Social History   Substance and Sexual Activity  Drug Use No    Social History   Socioeconomic History   Marital status: Single    Spouse name: Not on file   Number of children: Not on file   Years of education: Not on file   Highest education level: Not on file  Occupational History   Not on file  Tobacco Use   Smoking status: Never   Smokeless tobacco: Never  Vaping Use   Vaping Use: Never used  Substance and Sexual Activity   Alcohol use: No   Drug use: No   Sexual activity: Not Currently  Other Topics Concern   Not on file  Social History Narrative   Not on file   Social Determinants of Health   Financial Resource Strain: Not on file  Food Insecurity: Not on file  Transportation Needs: Not on file  Physical Activity: Not on file  Stress: Not on file  Social Connections: Not on file   Additional Social History:    Allergies:   Allergies  Allergen Reactions   Phenytoin Sodium Extended Other (See Comments) and Nausea And Vomiting   Prednisone Other (See  Comments)   Latex Hives, Nausea And Vomiting and Rash    Labs: No results found for this or any previous visit (from the past 48 hour(s)).   Current Facility-Administered Medications  Medication Dose Route Frequency Provider Last Rate Last Admin   acetaminophen (TYLENOL) tablet 650 mg  650 mg Oral Q6H PRN Lucillie Garfinkel, MD   650 mg at 03/18/22 1058   ARIPiprazole (ABILIFY) tablet 5 mg  5 mg Oral Daily Patrecia Pour, NP   5 mg at 03/20/22 [id number]   aspirin EC tablet 81 mg  81 mg Oral Daily Patrecia Pour, NP   81 mg at 03/21/22 1012   benztropine (COGENTIN) tablet 1 mg  1 mg Oral Daily Patrecia Pour, NP   1 mg at 03/20/22 [id number]    fenofibrate tablet 54 mg  54 mg Oral Daily Patrecia Pour, NP   54 mg at 03/20/22 0909   gemfibrozil (LOPID) tablet 600 mg  600 mg Oral BID Patrecia Pour, NP   600 mg at 03/20/22 2131   levothyroxine (SYNTHROID) tablet 88 mcg  88 mcg Oral Daily Patrecia Pour, NP   88 mcg at 03/21/22 1012   lisinopril (ZESTRIL) tablet 10 mg  10 mg Oral Daily Patrecia Pour, NP   10 mg at 03/21/22 1011   LORazepam (ATIVAN) tablet 2 mg  2 mg Oral Once Sung, Jade J, MD       metFORMIN (GLUCOPHAGE) tablet 1,000 mg  1,000 mg Oral BID WC Patrecia Pour, NP   1,000 mg at 03/21/22 1010   pioglitazone (ACTOS) tablet 45 mg  45 mg Oral Daily Patrecia Pour, NP   45 mg at 03/20/22 0908   risperiDONE (RISPERDAL) tablet 2 mg  2 mg Oral BID Patrecia Pour, NP   2 mg at 03/21/22 1011   simvastatin (ZOCOR) tablet 20 mg  20 mg Oral QHS Patrecia Pour, NP   20 mg at 03/20/22 2130   temazepam (RESTORIL) capsule 15 mg  15 mg Oral QHS Patrecia Pour, NP   15 mg at 03/20/22 2130   Current Outpatient Medications  Medication Sig Dispense Refill   ARIPiprazole ER (ABILIFY MAINTENA) 400 MG SRER injection Inject 2 mLs (400 mg total) into the muscle every 28 (twenty-eight) days. Next dose due 03/18/22 1 each 1   aspirin EC 81 MG tablet Take 1 tablet (81 mg total) by mouth daily. Swallow whole. 30 tablet 1   benztropine (COGENTIN) 1 MG tablet Take 1 tablet (1 mg total) by mouth daily. (Patient not taking: Reported on 03/09/2022) 30 tablet 1   fenofibrate 54 MG tablet Take 1 tablet (54 mg total) by mouth daily. 30 tablet 1   gemfibrozil (LOPID) 600 MG tablet Take 1 tablet (600 mg total) by mouth 2 (two) times daily. 60 tablet 1   levothyroxine (SYNTHROID) 88 MCG tablet Take 1 tablet (88 mcg total) by mouth daily. 30 tablet 1   lisinopril (ZESTRIL) 10 MG tablet Take 10 mg by mouth daily.     metFORMIN (GLUCOPHAGE) 1000 MG tablet Take 1 tablet (1,000 mg total) by mouth 2 (two) times daily with a meal. 60 tablet 1   pioglitazone (ACTOS)  45 MG tablet Take 1 tablet (45 mg total) by mouth daily. 30 tablet 1   simvastatin (ZOCOR) 20 MG tablet Take 1 tablet (20 mg total) by mouth at bedtime. 30 tablet 1   temazepam (RESTORIL) 15 MG capsule Take 1 capsule (15 mg  total) by mouth at bedtime. 30 capsule 1    Musculoskeletal: Strength & Muscle Tone: within normal limits Gait & Station: normal Patient leans: N/A  Psychiatric Specialty Exam: Physical Exam Vitals and nursing note reviewed.  Constitutional:      Appearance: Normal appearance.  HENT:     Head: Normocephalic.     Nose: Nose normal.  Pulmonary:     Effort: Pulmonary effort is normal.  Musculoskeletal:        General: Normal range of motion.     Cervical back: Normal range of motion.  Neurological:     General: No focal deficit present.     Mental Status: He is alert and oriented to person, place, and time.  Psychiatric:        Attention and Perception: Attention and perception normal.        Mood and Affect: Mood is anxious and depressed. Affect is blunt.        Speech: Speech normal.        Behavior: Behavior normal. Behavior is cooperative.        Thought Content: Thought content is paranoid and delusional.        Cognition and Memory: Cognition is impaired.        Judgment: Judgment is impulsive and inappropriate.     Review of Systems  Psychiatric/Behavioral:  Positive for depression. The patient is nervous/anxious.   All other systems reviewed and are negative.   Blood pressure 133/80, pulse 100, temperature 97.7 F (36.5 C), temperature source Oral, resp. rate 18, height 6\' 4"  (1.93 m), weight 109 kg, SpO2 99 %.Body mass index is 29.25 kg/m.  General Appearance: Disheveled  Eye Contact:  Fair  Speech:  Normal Rate  Volume:  Normal  Mood:  Anxious, Depressed, and Irritable  Affect:  Blunt  Thought Process:  Coherent  Orientation:  Full (Time, Place, and Person)  Thought Content:  Delusions, Paranoid Ideation, and Rumination  Suicidal  Thoughts:  No  Homicidal Thoughts:  No  Memory:  Immediate;   Fair Recent;   Fair Remote;   Fair  Judgement:  Poor  Insight:  Lacking  Psychomotor Activity:  Normal  Concentration:  Concentration: Fair and Attention Span: Fair  Recall:  of Knowledge:  Fair  Language:  Fair  Akathisia:  No  Handed:  Right  AIMS (if indicated):     Assets:  Housing Leisure Time Physical Health Resilience Social Support  ADL's:  Intact  Cognition:  Impaired,  Mild  Sleep:        Physical Exam: Physical Exam Vitals and nursing note reviewed.  Constitutional:      Appearance: Normal appearance.  HENT:     Head: Normocephalic.     Nose: Nose normal.  Pulmonary:     Effort: Pulmonary effort is normal.  Musculoskeletal:        General: Normal range of motion.     Cervical back: Normal range of motion.  Neurological:     General: No focal deficit present.     Mental Status: He is alert and oriented to person, place, and time.  Psychiatric:        Attention and Perception: Attention and perception normal.        Mood and Affect: Mood is anxious and depressed. Affect is blunt.        Speech: Speech normal.        Behavior: Behavior normal. Behavior is cooperative.  Thought Content: Thought content is paranoid and delusional.        Cognition and Memory: Cognition is impaired.        Judgment: Judgment is impulsive and inappropriate.    Review of Systems  Psychiatric/Behavioral:  Positive for depression. The patient is nervous/anxious.   All other systems reviewed and are negative.  Blood pressure 133/80, pulse 100, temperature 97.7 F (36.5 C), temperature source Oral, resp. rate 18, height 6\' 4"  (1.93 m), weight 109 kg, SpO2 99 %. Body mass index is 29.25 kg/m.  Treatment Plan Summary: Daily contact with patient to assess and evaluate symptoms and progress in treatment, Medication management, and Plan : Schizophrenia, paranoid type: Risperdal 1 mg BID increased to  2 mg BID Abilify 5 mg daily  EPS: Cogentin 1 mg daily  Insomnia: Restoril 15 mg daily at bedtime  Disposition: Recommend psychiatric Inpatient admission when medically cleared.  Waylan Boga, NP 03/21/2022 10:14 AM

## 2022-03-21 NOTE — ED Notes (Signed)
Pt given shower supplies and clean wine colored scrubs. Pt currently showering.

## 2022-03-21 NOTE — ED Provider Notes (Signed)
Emergency Medicine Observation Re-evaluation Note  Derrick Atkins is a 51 y.o. male, seen on rounds today.  Pt initially presented to the ED for complaints of Suicidal and Hand Pain Currently, the patient is resting, voices no medical complaints.  Physical Exam  BP 131/78 (BP Location: Right Arm)   Pulse 92   Temp 97.6 F (36.4 C)   Resp 16   Ht 6\' 4"  (1.93 m)   Wt 109 kg   SpO2 99%   BMI 29.25 kg/m  Physical Exam General: Resting in no acute distress Cardiac: No cyanosis Lungs: Equal rise and fall Psych: Not agitated  ED Course / MDM  EKG:EKG Interpretation  Date/Time:  Monday March 15 2022 16:54:35 EDT Ventricular Rate:  89 PR Interval:  132 QRS Duration: 90 QT Interval:  392 QTC Calculation: 476 R Axis:   38 Text Interpretation: Normal sinus rhythm Possible Left atrial enlargement Borderline ECG When compared with ECG of 09-Mar-2022 13:46, No significant change was found Confirmed by UNCONFIRMED, DOCTOR (11-Mar-2022), editor [id number], Tammy [phone]) on 03/16/2022 12:40:26 PM  I have reviewed the labs performed to date as well as medications administered while in observation.  Recent changes in the last 24 hours include no events overnight.  Plan  Current plan is for psychiatric disposition.    05/16/2022, MD 03/21/22 [phone]

## 2022-03-21 NOTE — ED Notes (Signed)
VOL/pending psych inpatient admit when medically cleared

## 2022-03-21 NOTE — BH Assessment (Signed)
Updated referral information for Psychiatric Hospitalization re-faxed to;    [facility] [phone]- [phone]),    [facility] [phone]),   [facility] [phone], [phone], [phone] or [phone]),    [place] [phone])   [address] [phone]),    [facility] [phone] -or- [phone]),    [facility] [phone])   [facility] [phone]).   [facility] [phone])

## 2022-03-22 NOTE — ED Notes (Signed)
Resumed care from [place] rn  pt alert, watching tv in room

## 2022-03-22 NOTE — ED Notes (Signed)
Pt given nighttime snack. 

## 2022-03-22 NOTE — ED Notes (Signed)
VOL  PENDING  PLACEMENT 

## 2022-03-22 NOTE — ED Provider Notes (Signed)
Emergency Medicine Observation Re-evaluation Note  Derrick Atkins is a 51 y.o. male, seen on rounds today.  Pt initially presented to the ED for complaints of Suicidal and Hand Pain  Currently, the patient is is no acute distress. Denies any concerns at this time.  Physical Exam  Blood pressure 114/72, pulse (!) 126, temperature 97.6 F (36.4 C), temperature source Oral, resp. rate 19, height 6\' 4"  (1.93 m), weight 109 kg, SpO2 100 %.  Physical Exam: General: No apparent distress Pulm: Normal WOB Neuro: Moving all extremities Psych: Resting comfortably     ED Course / MDM     I have reviewed the labs performed to date as well as medications administered while in observation.  Recent changes in the last 24 hours include: No acute events overnight.  Plan   Current plan: Patient awaiting psychiatric disposition. Patient is not under full IVC at this time.    Krystyna Cleckley, , DO 03/22/22 [phone]

## 2022-03-23 ENCOUNTER — Other Ambulatory Visit: Payer: Self-pay

## 2022-03-23 ENCOUNTER — Inpatient Hospital Stay
Admission: AD | Admit: 2022-03-23 | Discharge: 2022-04-09 | DRG: 885 | Disposition: A | Discharge status: Home / Self Care | Payer: Medicare Other | Source: Intra-hospital | Attending: Psychiatry | Admitting: Psychiatry

## 2022-03-23 ENCOUNTER — Encounter: Payer: Self-pay | Admitting: Psychiatry

## 2022-03-23 DIAGNOSIS — Z8616 Personal history of COVID-19: Secondary | ICD-10-CM

## 2022-03-23 DIAGNOSIS — F209 Schizophrenia, unspecified: Secondary | ICD-10-CM | POA: Diagnosis present

## 2022-03-23 DIAGNOSIS — Z79890 Hormone replacement therapy (postmenopausal): Secondary | ICD-10-CM | POA: Diagnosis not present

## 2022-03-23 DIAGNOSIS — E119 Type 2 diabetes mellitus without complications: Secondary | ICD-10-CM | POA: Diagnosis present

## 2022-03-23 DIAGNOSIS — Z888 Allergy status to other drugs, medicaments and biological substances status: Secondary | ICD-10-CM

## 2022-03-23 DIAGNOSIS — I10 Essential (primary) hypertension: Secondary | ICD-10-CM | POA: Diagnosis present

## 2022-03-23 DIAGNOSIS — F419 Anxiety disorder, unspecified: Secondary | ICD-10-CM | POA: Diagnosis present

## 2022-03-23 DIAGNOSIS — Z87442 Personal history of urinary calculi: Secondary | ICD-10-CM

## 2022-03-23 DIAGNOSIS — X58XXXA Exposure to other specified factors, initial encounter: Secondary | ICD-10-CM | POA: Diagnosis present

## 2022-03-23 DIAGNOSIS — Z8249 Family history of ischemic heart disease and other diseases of the circulatory system: Secondary | ICD-10-CM | POA: Diagnosis not present

## 2022-03-23 DIAGNOSIS — G809 Cerebral palsy, unspecified: Secondary | ICD-10-CM | POA: Diagnosis present

## 2022-03-23 DIAGNOSIS — Z23 Encounter for immunization: Secondary | ICD-10-CM | POA: Diagnosis present

## 2022-03-23 DIAGNOSIS — M419 Scoliosis, unspecified: Secondary | ICD-10-CM | POA: Diagnosis present

## 2022-03-23 DIAGNOSIS — Z7984 Long term (current) use of oral hypoglycemic drugs: Secondary | ICD-10-CM | POA: Diagnosis not present

## 2022-03-23 DIAGNOSIS — Z91040 Latex allergy status: Secondary | ICD-10-CM | POA: Diagnosis not present

## 2022-03-23 DIAGNOSIS — S0010XA Contusion of unspecified eyelid and periocular area, initial encounter: Secondary | ICD-10-CM | POA: Diagnosis present

## 2022-03-23 DIAGNOSIS — F32A Depression, unspecified: Secondary | ICD-10-CM | POA: Diagnosis present

## 2022-03-23 DIAGNOSIS — Z91148 Patient's other noncompliance with medication regimen for other reason: Secondary | ICD-10-CM | POA: Diagnosis not present

## 2022-03-23 DIAGNOSIS — F200 Paranoid schizophrenia: Secondary | ICD-10-CM | POA: Diagnosis not present

## 2022-03-23 DIAGNOSIS — F203 Undifferentiated schizophrenia: Secondary | ICD-10-CM | POA: Diagnosis not present

## 2022-03-23 LAB — GLUCOSE, CAPILLARY: Glucose-Capillary: 128 mg/dL — ABNORMAL HIGH (ref 70–99)

## 2022-03-23 MED ORDER — RISPERIDONE 1 MG PO TABS
2.0000 mg | ORAL_TABLET | Freq: Two times a day (BID) | ORAL | Status: DC
  Administered 2022-03-23 – 2022-03-24 (×2): 2 mg via ORAL
  Filled 2022-03-23 (×2): qty 2

## 2022-03-23 MED ORDER — GEMFIBROZIL 600 MG PO TABS
600.0000 mg | ORAL_TABLET | Freq: Two times a day (BID) | ORAL | Status: DC
  Administered 2022-03-23 – 2022-04-09 (×33): 600 mg via ORAL
  Filled 2022-03-23 (×35): qty 1

## 2022-03-23 MED ORDER — ACETAMINOPHEN 325 MG PO TABS
650.0000 mg | ORAL_TABLET | Freq: Four times a day (QID) | ORAL | Status: DC | PRN
  Administered 2022-03-25 – 2022-04-09 (×19): 650 mg via ORAL
  Filled 2022-03-23 (×18): qty 2

## 2022-03-23 MED ORDER — PIOGLITAZONE HCL 30 MG PO TABS
45.0000 mg | ORAL_TABLET | Freq: Every day | ORAL | Status: DC
  Administered 2022-03-24 – 2022-04-09 (×17): 45 mg via ORAL
  Filled 2022-03-23 (×17): qty 1

## 2022-03-23 MED ORDER — LISINOPRIL 20 MG PO TABS
10.0000 mg | ORAL_TABLET | Freq: Every day | ORAL | Status: DC
  Administered 2022-03-24 – 2022-04-09 (×17): 10 mg via ORAL
  Filled 2022-03-23: qty 2
  Filled 2022-03-23: qty 1
  Filled 2022-03-23: qty 2
  Filled 2022-03-23: qty 1
  Filled 2022-03-23: qty 2
  Filled 2022-03-23 (×2): qty 1
  Filled 2022-03-23 (×3): qty 2
  Filled 2022-03-23 (×4): qty 1
  Filled 2022-03-23 (×3): qty 2

## 2022-03-23 MED ORDER — TEMAZEPAM 15 MG PO CAPS
15.0000 mg | ORAL_CAPSULE | Freq: Every day | ORAL | Status: DC
  Administered 2022-03-23 – 2022-04-01 (×10): 15 mg via ORAL
  Filled 2022-03-23 (×10): qty 1

## 2022-03-23 MED ORDER — INFLUENZA VAC SPLIT QUAD 0.5 ML IM SUSY
0.5000 mL | PREFILLED_SYRINGE | INTRAMUSCULAR | Status: AC
  Administered 2022-03-24: 0.5 mL via INTRAMUSCULAR
  Filled 2022-03-23: qty 0.5

## 2022-03-23 MED ORDER — ALUM & MAG HYDROXIDE-SIMETH 200-200-20 MG/5ML PO SUSP
30.0000 mL | ORAL | Status: DC | PRN
  Administered 2022-04-01: 30 mL via ORAL
  Filled 2022-03-23: qty 30

## 2022-03-23 MED ORDER — LEVOTHYROXINE SODIUM 88 MCG PO TABS
88.0000 ug | ORAL_TABLET | Freq: Every day | ORAL | Status: DC
  Administered 2022-03-24 – 2022-04-09 (×16): 88 ug via ORAL
  Filled 2022-03-23 (×17): qty 1

## 2022-03-23 MED ORDER — FENOFIBRATE 54 MG PO TABS
54.0000 mg | ORAL_TABLET | Freq: Every day | ORAL | Status: DC
  Administered 2022-03-24 – 2022-04-09 (×17): 54 mg via ORAL
  Filled 2022-03-23 (×18): qty 1

## 2022-03-23 MED ORDER — ASPIRIN 81 MG PO TBEC
81.0000 mg | DELAYED_RELEASE_TABLET | Freq: Every day | ORAL | Status: DC
  Administered 2022-03-24 – 2022-04-09 (×17): 81 mg via ORAL
  Filled 2022-03-23 (×17): qty 1

## 2022-03-23 MED ORDER — SIMVASTATIN 40 MG PO TABS
20.0000 mg | ORAL_TABLET | Freq: Every day | ORAL | Status: DC
  Administered 2022-03-23 – 2022-04-08 (×17): 20 mg via ORAL
  Filled 2022-03-23 (×17): qty 1

## 2022-03-23 MED ORDER — BENZTROPINE MESYLATE 1 MG PO TABS
1.0000 mg | ORAL_TABLET | Freq: Every day | ORAL | Status: DC
  Administered 2022-03-24 – 2022-04-09 (×17): 1 mg via ORAL
  Filled 2022-03-23 (×16): qty 1

## 2022-03-23 MED ORDER — ARIPIPRAZOLE 5 MG PO TABS
5.0000 mg | ORAL_TABLET | Freq: Every day | ORAL | Status: DC
Start: 2022-03-24 — End: 2022-03-24
  Filled 2022-03-23: qty 1

## 2022-03-23 MED ORDER — PNEUMOCOCCAL 20-VAL CONJ VACC 0.5 ML IM SUSY
0.5000 mL | PREFILLED_SYRINGE | INTRAMUSCULAR | Status: DC
  Filled 2022-03-23: qty 0.5

## 2022-03-23 MED ORDER — METFORMIN HCL 500 MG PO TABS
1000.0000 mg | ORAL_TABLET | Freq: Two times a day (BID) | ORAL | Status: DC
  Administered 2022-03-23 – 2022-04-09 (×33): 1000 mg via ORAL
  Filled 2022-03-23 (×35): qty 2

## 2022-03-23 MED ORDER — MAGNESIUM HYDROXIDE 400 MG/5ML PO SUSP
30.0000 mL | Freq: Every day | ORAL | Status: DC | PRN
  Administered 2022-04-01 – 2022-04-08 (×3): 30 mL via ORAL
  Filled 2022-03-23 (×3): qty 30

## 2022-03-23 NOTE — Progress Notes (Signed)
Admission Note:   Report was received from [place], [place] on a 51 year-old male who initially presented Voluntary, on 03/15/2022, in no acute distress for the treatment of SI and medication noncompliance. Patient appears flat and depressed. Patient was calm and cooperative with admission process. Patient stated that he doesn't know why he's here, "somebody must have called for me. I was just sitting outside and then they transferred me here". Patient endorsed both depression and anxiety to this writer stating "I always wonder when my food is going to come. I usually borrow from the neighbors or from the church I used to go to". Patient denies SI/HI/AVH, but reports having nightmares. Patient rated back pain a "9/10", stating that he has Scoliosis, but he did not request any PRN pain medication from this Clinical research associate. Patient reports that he has an ACTT with [facility] and that they can get him to his appointments, and that he gets his medication from [employer]. However, it was stated in a previous note that nobody brings him his medicine. Patient has a past medical history of HTN, Paralysis, Kidney disease, and DM. His CBG was 128 upon admission. Skin was assessed with Wendall Mola, RN and found to some bruising to bilateral upper/lower arms and left leg by his knee, a scab to the right knee, and skin tags all over back. Patient searched and no contraband found and unit policies explained and understanding verbalized. Consents obtained. Food and fluids offered, and both accepted. Patient had no additional questions or concerns to voice at this time. Patient remains safe on the unit and will continue to monitor.

## 2022-03-23 NOTE — BH Assessment (Signed)
Writer called and left a HIPPA Compliant message with guardian ([name] [name]-[phone]), requesting a return phone call.

## 2022-03-23 NOTE — Tx Team (Signed)
Initial Treatment Plan 03/23/2022 6:42 PM Derrick Atkins [id number]    PATIENT STRESSORS: Financial difficulties   Medication change or noncompliance     PATIENT STRENGTHS: General fund of knowledge  Motivation for treatment/growth    PATIENT IDENTIFIED PROBLEMS: SI on arrival to ED on 03/15/2022  Medication noncompliance  Anxiety                 DISCHARGE CRITERIA:  Adequate post-discharge living arrangements Improved stabilization in mood, thinking, and/or behavior Need for constant or close observation no longer present Reduction of life-threatening or endangering symptoms to within safe limits  PRELIMINARY DISCHARGE PLAN: Outpatient therapy Placement in alternative living arrangements  PATIENT/FAMILY INVOLVEMENT: This treatment plan has been presented to and reviewed with the patient, Derrick Atkins. The patient has been given the opportunity to ask questions and make suggestions.  Ryliegh Mcduffey, RN 03/23/2022, 6:42 PM

## 2022-03-23 NOTE — ED Notes (Signed)
Derrick Atkins, legal guardian gave verbal consent for patient to go downstairs to behaviorial inpatient unit. Witnessed by [employer], Charity fundraiser. Voluntary consent filled out by this RN and Illene Labrador, Charity fundraiser. Verbal consent discussed with [name] on phone, verbalizes understanding at this time.

## 2022-03-23 NOTE — Progress Notes (Signed)
Patient presents with a bizarre affect, pacing the halls with an angry look on his face. Pt states it's because he doesn't know why he is here with [place]. Pt given education. Pt compliant with medication administration per MD orders. Pt given education, support, and encouragement to be active in his treatment plan. Pt being monitored Q 15 minutes for safety per unit protocol, remains safe on the unit.

## 2022-03-23 NOTE — Plan of Care (Signed)
New admission.  Problem: Education: Goal: Knowledge of General Education information will improve Description: Including pain rating scale, medication(s)/side effects and non-pharmacologic comfort measures Outcome: Not Progressing   Problem: Health Behavior/Discharge Planning: Goal: Ability to manage health-related needs will improve Outcome: Not Progressing   Problem: Clinical Measurements: Goal: Ability to maintain clinical measurements within normal limits will improve Outcome: Not Progressing Goal: Will remain free from infection Outcome: Not Progressing Goal: Diagnostic test results will improve Outcome: Not Progressing Goal: Respiratory complications will improve Outcome: Not Progressing Goal: Cardiovascular complication will be avoided Outcome: Not Progressing   Problem: Activity: Goal: Risk for activity intolerance will decrease Outcome: Not Progressing   Problem: Nutrition: Goal: Adequate nutrition will be maintained Outcome: Not Progressing   Problem: Coping: Goal: Level of anxiety will decrease Outcome: Not Progressing   Problem: Elimination: Goal: Will not experience complications related to bowel motility Outcome: Not Progressing Goal: Will not experience complications related to urinary retention Outcome: Not Progressing   Problem: Pain Managment: Goal: General experience of comfort will improve Outcome: Not Progressing   Problem: Safety: Goal: Ability to remain free from injury will improve Outcome: Not Progressing   Problem: Skin Integrity: Goal: Risk for impaired skin integrity will decrease Outcome: Not Progressing   Problem: Education: Goal: Knowledge of [facility] Education information/materials will improve Outcome: Not Progressing Goal: Emotional status will improve Outcome: Not Progressing Goal: Mental status will improve Outcome: Not Progressing Goal: Verbalization of understanding the information provided will  improve Outcome: Not Progressing   Problem: Health Behavior/Discharge Planning: Goal: Compliance with treatment plan for underlying cause of condition will improve Outcome: Not Progressing   Problem: Safety: Goal: Periods of time without injury will increase Outcome: Not Progressing   Problem: Coping: Goal: Coping ability will improve Outcome: Not Progressing   Problem: Self-Concept: Goal: Ability to disclose and discuss suicidal ideas will improve Outcome: Not Progressing Goal: Will verbalize positive feelings about self Outcome: Not Progressing   Problem: Self-Concept: Goal: Ability to identify factors that promote anxiety will improve Outcome: Not Progressing Goal: Level of anxiety will decrease Outcome: Not Progressing Goal: Ability to modify response to factors that promote anxiety will improve Outcome: Not Progressing

## 2022-03-23 NOTE — Consult Note (Signed)
[facility] Psych ED Progress Note  03/23/2022 12:15 PM Derrick Atkins  MRN:  [id number]   Method of visit?: Face to Face   Subjective:  "I've had some hard things in my life."  Patient has been calm and cooperative last several shifts. Spends time watching TV. On evaluation today, he admits that he ""needs help." He cannot explain why he has done some of the irrational behaviors at home that have been documented. He states that he is taking his medicines "mostly." Patient's COVID quarantine is over today and he will be admitted to inpatient psychiatry.      Principal Problem: Schizophrenia, paranoid, chronic (HCC) Diagnosis:  Principal Problem:   Schizophrenia, paranoid, chronic (HCC)  Total Time spent with patient: 20 minutes  Past Psychiatric History: see previous  Past Medical History:  Past Medical History:  Diagnosis Date   Cerebral palsy (HCC)    Depression    Diabetes mellitus without complication (HCC)    Hypertension    Kidney stones    Schizoaffective disorder (HCC)    Scoliosis     Past Surgical History:  Procedure Laterality Date   BOWEL RESECTION     LAPAROTOMY N/A 05/15/2020   Procedure: EXPLORATORY LAPAROTOMY;  Surgeon: Henrene Dodge, MD;  Location: [facility] ORS;  Service: General;  Laterality: N/A;   Family History:  Family History  Problem Relation Age of Onset   Heart disease Father    Heart attack Father    Family Psychiatric  History:  Social History:  Social History   Substance and Sexual Activity  Alcohol Use No     Social History   Substance and Sexual Activity  Drug Use No    Social History   Socioeconomic History   Marital status: Single    Spouse name: Not on file   Number of children: Not on file   Years of education: Not on file   Highest education level: Not on file  Occupational History   Not on file  Tobacco Use   Smoking status: Never   Smokeless tobacco: Never  Vaping Use   Vaping Use: Never used  Substance and Sexual  Activity   Alcohol use: No   Drug use: No   Sexual activity: Not Currently  Other Topics Concern   Not on file  Social History Narrative   Not on file   Social Determinants of Health   Financial Resource Strain: Not on file  Food Insecurity: Not on file  Transportation Needs: Not on file  Physical Activity: Not on file  Stress: Not on file  Social Connections: Not on file    Sleep: Good  Appetite:  Good  Current Medications: Current Facility-Administered Medications  Medication Dose Route Frequency Provider Last Rate Last Admin   acetaminophen (TYLENOL) tablet 650 mg  650 mg Oral Q6H PRN Pilar Jarvis, MD   650 mg at 03/23/22 0131   ARIPiprazole (ABILIFY) tablet 5 mg  5 mg Oral Daily Charm Rings, NP   5 mg at 03/23/22 1004   aspirin EC tablet 81 mg  81 mg Oral Daily Charm Rings, NP   81 mg at 03/23/22 1003   benztropine (COGENTIN) tablet 1 mg  1 mg Oral Daily Charm Rings, NP   1 mg at 03/23/22 1003   fenofibrate tablet 54 mg  54 mg Oral Daily Charm Rings, NP   54 mg at 03/23/22 1004   gemfibrozil (LOPID) tablet 600 mg  600 mg Oral BID Charm Rings,  NP   600 mg at 03/23/22 1004   levothyroxine (SYNTHROID) tablet 88 mcg  88 mcg Oral Daily Charm Rings, NP   88 mcg at 03/23/22 1003   lisinopril (ZESTRIL) tablet 10 mg  10 mg Oral Daily Charm Rings, NP   10 mg at 03/23/22 1003   LORazepam (ATIVAN) tablet 2 mg  2 mg Oral Once Irean Hong, MD       metFORMIN (GLUCOPHAGE) tablet 1,000 mg  1,000 mg Oral BID WC Charm Rings, NP   1,000 mg at 03/23/22 1003   pioglitazone (ACTOS) tablet 45 mg  45 mg Oral Daily Charm Rings, NP   45 mg at 03/23/22 1004   risperiDONE (RISPERDAL) tablet 2 mg  2 mg Oral BID Charm Rings, NP   2 mg at 03/23/22 1003   simvastatin (ZOCOR) tablet 20 mg  20 mg Oral QHS Charm Rings, NP   20 mg at 03/22/22 2146   temazepam (RESTORIL) capsule 15 mg  15 mg Oral QHS Charm Rings, NP   15 mg at 03/22/22 2147   Current Outpatient  Medications  Medication Sig Dispense Refill   ARIPiprazole ER (ABILIFY MAINTENA) 400 MG SRER injection Inject 2 mLs (400 mg total) into the muscle every 28 (twenty-eight) days. Next dose due 03/18/22 1 each 1   aspirin EC 81 MG tablet Take 1 tablet (81 mg total) by mouth daily. Swallow whole. 30 tablet 1   benztropine (COGENTIN) 1 MG tablet Take 1 tablet (1 mg total) by mouth daily. (Patient not taking: Reported on 03/09/2022) 30 tablet 1   fenofibrate 54 MG tablet Take 1 tablet (54 mg total) by mouth daily. 30 tablet 1   gemfibrozil (LOPID) 600 MG tablet Take 1 tablet (600 mg total) by mouth 2 (two) times daily. 60 tablet 1   levothyroxine (SYNTHROID) 88 MCG tablet Take 1 tablet (88 mcg total) by mouth daily. 30 tablet 1   lisinopril (ZESTRIL) 10 MG tablet Take 10 mg by mouth daily.     metFORMIN (GLUCOPHAGE) 1000 MG tablet Take 1 tablet (1,000 mg total) by mouth 2 (two) times daily with a meal. 60 tablet 1   pioglitazone (ACTOS) 45 MG tablet Take 1 tablet (45 mg total) by mouth daily. 30 tablet 1   simvastatin (ZOCOR) 20 MG tablet Take 1 tablet (20 mg total) by mouth at bedtime. 30 tablet 1   temazepam (RESTORIL) 15 MG capsule Take 1 capsule (15 mg total) by mouth at bedtime. 30 capsule 1    Lab Results: No results found for this or any previous visit (from the past 48 hour(s)).  Blood Alcohol level:  Lab Results  Component Value Date   ETH <10 03/15/2022   ETH <10 03/12/2022    Physical Findings: AIMS:  , ,  ,  ,    CIWA:    COWS:     Musculoskeletal: Strength & Muscle Tone: within normal limits Gait & Station: normal Patient leans: N/A  Psychiatric Specialty Exam:  Presentation  General Appearance: Appropriate for Environment  Eye Contact:Fair  Speech:Clear and Coherent  Speech Volume:Normal  Handedness:Right   Mood and Affect  Mood:Euthymic  Affect:Congruent   Thought Process  Thought Processes:Coherent  Descriptions of  Associations:Intact  Orientation:Full (Time, Place and Person)  Thought Content:Logical  History of Schizophrenia/Schizoaffective disorder:Yes  Duration of Psychotic Symptoms:Greater than six months  Hallucinations:No data recorded Ideas of Reference:None  Suicidal Thoughts:No data recorded Homicidal Thoughts:No data recorded  Sensorium  Memory:Immediate Fair; Remote Fair  Judgment:Fair  Insight:Present   Executive Functions  Concentration:Fair  Attention Span:Fair  Recall:Fair  [employer] of Knowledge:Fair  Language:Fair   Psychomotor Activity  Psychomotor Activity:No data recorded  Assets  Assets:Communication Skills; Financial Resources/Insurance; Housing; Social Support   Sleep  Sleep:No data recorded   Physical Exam: Physical Exam ROS Blood pressure 112/70, pulse 79, temperature 98.5 F (36.9 C), temperature source Oral, resp. rate 16, height 6\' 4"  (1.93 m), weight 109 kg, SpO2 98 %. Body mass index is 29.25 kg/m.  Treatment Plan Summary: Plan Admit to inpatient psychiatry  , NP 03/23/2022, 12:15 PM

## 2022-03-24 DIAGNOSIS — F203 Undifferentiated schizophrenia: Secondary | ICD-10-CM

## 2022-03-24 MED ORDER — ARIPIPRAZOLE ER 400 MG IM SRER
400.0000 mg | INTRAMUSCULAR | Status: DC
  Administered 2022-03-25: 400 mg via INTRAMUSCULAR
  Filled 2022-03-24 (×2): qty 2

## 2022-03-24 MED ORDER — OLANZAPINE 10 MG PO TABS
10.0000 mg | ORAL_TABLET | Freq: Every day | ORAL | Status: DC
  Administered 2022-03-24 – 2022-03-31 (×8): 10 mg via ORAL
  Filled 2022-03-24 (×8): qty 1

## 2022-03-24 MED ORDER — DIVALPROEX SODIUM 500 MG PO DR TAB
500.0000 mg | DELAYED_RELEASE_TABLET | Freq: Two times a day (BID) | ORAL | Status: DC
  Administered 2022-03-24 – 2022-04-01 (×16): 500 mg via ORAL
  Filled 2022-03-24 (×17): qty 1

## 2022-03-24 NOTE — Plan of Care (Signed)
Patient appears with a sad flat affect. Patient states " I don't love myself but I am in love." Patient speech soft ,slow and disorganized. In & out of his room,pacing in the hall way. Minimal interactions with peers. Patient denies SI,HI and AVH. Appetite and energy level good. Compliant with medications. Abilify injection is rescheduled for tomorrow since it is not available in the pharmacy. Support and encouragement given.

## 2022-03-24 NOTE — Progress Notes (Signed)
Recreation Therapy Notes    Date: 03/24/2022   Time: 10:30 am   Location: Craft room    Behavioral response: N/A   Intervention Topic: Stress Management    Discussion/Intervention: Patient refused to attend group.   Clinical Observations/Feedback:  Patient refused to attend group.   Jaxson Keener LRT/CTRS          Julita Ozbun 03/24/2022 11:23 AM

## 2022-03-24 NOTE — [facility] Suicide Risk Assessment (Signed)
[facility] INPATIENT:  Family/Significant Other Suicide Prevention Education  Suicide Prevention Education:  Contact Attempts: [name] [name]/legal guardian [phone]), has been identified by the patient as the family member/significant other with whom the patient will be residing, and identified as the person(s) who will aid the patient in the event of a mental health crisis.  With written consent from the patient, two attempts were made to provide suicide prevention education, prior to and/or following the patient's discharge.  We were unsuccessful in providing suicide prevention education.  A suicide education pamphlet was given to the patient to share with family/significant other.  Date and time of first attempt: 03/24/22 at 15:32 Date and time of second attempt: Second attempt needed.  Glenis Smoker 03/24/2022, 3:33 PM

## 2022-03-24 NOTE — [facility] Group Notes (Signed)
[facility] Group Notes:  (Nursing/MHT/Case Management/Adjunct)  Date:  03/24/2022  Time:  9:07 PM  Type of Therapy:   Wrap up  Participation Level:  None  Participation Quality:   Stood outside the door didn't come in and walk away   Summary of Progress/Problems:  Derrick Atkins 03/24/2022, 9:07 PM

## 2022-03-24 NOTE — H&P (Signed)
Psychiatric Admission Assessment Adult  Patient Identification: Derrick Atkins MRN:  [id number] Date of Evaluation:  03/24/2022 Chief Complaint:  Schizophrenia (HCC) [F20.9] Principal Diagnosis: Schizophrenia (HCC) Diagnosis:  Principal Problem:   Schizophrenia (HCC) Active Problems:   Type 2 diabetes mellitus without complications (HCC)  History of Present Illness: Patient seen and chart reviewed.  Patient known from previous hospital visits.  This is a 51 year old man with a history of schizophrenia or schizoaffective disorder who has been in the emergency room for about 10 days recovering from COVID.  Initially brought to the hospital after petition by ACT team.  He had been engaging in worsening agitated behavior disruptive to the neighborhood all driven by paranoia about neighbors.  On interview today the patient is tearful and somewhat disorganized.  Talks about how the neighbors in his apartment complex were somehow doing something that was bothering him.  Does not deny that he was getting upset in public.  Patient is not a very clear historian and often disorganized perseverating on minor details.  Denies current hallucinations denies suicidal thought intent or plan.  Within the last day of coming to the unit the patient has managed to start fights with other patients and then to tearfully apologize.  Spoke with ACT team leader confirming that patient has been more labile and noncompliant with oral medicine outside the hospital.  Denies that he had been using any recent drugs or alcohol Associated Signs/Symptoms: Depression Symptoms:  psychomotor agitation, feelings of worthlessness/guilt, difficulty concentrating, anxiety, Duration of Depression Symptoms: Greater than two weeks  (Hypo) Manic Symptoms:  Impulsivity, Irritable Mood, Labiality of Mood, Anxiety Symptoms:  Excessive Worry, Psychotic Symptoms:  Paranoia, PTSD Symptoms: Negative Total Time spent with patient: 1  hour  Past Psychiatric History: Patient has a history of chronic psychotic disorder followed by an ACT team.  Had been living independently which seems to not be going very well recently.  More hospitalizations and more visits from emergency services because of his inability to cooperate with his ACT team.  Is the patient at risk to self? Yes.    Has the patient been a risk to self in the past 6 months? Yes.    Has the patient been a risk to self within the distant past? Yes.    Is the patient a risk to others? Yes.    Has the patient been a risk to others in the past 6 months? Yes.    Has the patient been a risk to others within the distant past? Yes.     [place] Scale:  Flowsheet Row Admission (Current) from 03/23/2022 in [facility] INPATIENT BEHAVIORAL MEDICINE ED from 03/15/2022 in [facility] EMERGENCY DEPARTMENT ED from 03/12/2022 in [facility]  C-SSRS RISK CATEGORY Error: Q3, 4, or 5 should not be populated when Q2 is No Low Risk High Risk        Prior Inpatient Therapy:   Prior Outpatient Therapy:    Alcohol Screening: 1. How often do you have a drink containing alcohol?: Monthly or less 2. How many drinks containing alcohol do you have on a typical day when you are drinking?: 1 or 2 3. How often do you have six or more drinks on one occasion?: Never AUDIT-C Score: 1 4. How often during the last year have you found that you were not able to stop drinking once you had started?: Never 5. How often during the last year have you failed to do what was normally  expected from you because of drinking?: Never 6. How often during the last year have you needed a first drink in the morning to get yourself going after a heavy drinking session?: Never 7. How often during the last year have you had a feeling of guilt of remorse after drinking?: Never 8. How often during the last year have you been unable to remember what happened the night  before because you had been drinking?: Never 9. Have you or someone else been injured as a result of your drinking?: No 10. Has a relative or friend or a doctor or another health worker been concerned about your drinking or suggested you cut down?: No Alcohol Use Disorder Identification Test Final Score (AUDIT): 1 Alcohol Brief Interventions/Follow-up: Alcohol education/Brief advice Substance Abuse History in the last 12 months:  No. Consequences of Substance Abuse: Negative Previous Psychotropic Medications: Yes  Psychological Evaluations: Yes  Past Medical History:  Past Medical History:  Diagnosis Date   Cerebral palsy (HCC)    Depression    Diabetes mellitus without complication (HCC)    Hypertension    Kidney stones    Schizoaffective disorder (HCC)    Scoliosis     Past Surgical History:  Procedure Laterality Date   BOWEL RESECTION     LAPAROTOMY N/A 05/15/2020   Procedure: EXPLORATORY LAPAROTOMY;  Surgeon: Henrene Dodge, MD;  Location: [facility] ORS;  Service: General;  Laterality: N/A;   Family History:  Family History  Problem Relation Age of Onset   Heart disease Father    Heart attack Father    Family Psychiatric  History: None Tobacco Screening:   Social History:  Social History   Substance and Sexual Activity  Alcohol Use No     Social History   Substance and Sexual Activity  Drug Use No    Additional Social History:                           Allergies:   Allergies  Allergen Reactions   Phenytoin Sodium Extended Other (See Comments) and Nausea And Vomiting   Prednisone Other (See Comments)   Latex Hives, Nausea And Vomiting and Rash   Lab Results:  Results for orders placed or performed during the hospital encounter of 03/23/22 (from the past 48 hour(s))  Glucose, capillary     Status: Abnormal   Collection Time: 03/23/22  5:09 PM  Result Value Ref Range   Glucose-Capillary 128 (H) 70 - 99 mg/dL    Comment: Glucose reference range  applies only to samples taken after fasting for at least 8 hours.    Blood Alcohol level:  Lab Results  Component Value Date   ETH <10 03/15/2022   ETH <10 03/12/2022    Metabolic Disorder Labs:  Lab Results  Component Value Date   HGBA1C 7.0 (H) 02/08/2022   MPG 154.2 02/08/2022   MPG 116.89 05/13/2020   No results found for: "PROLACTIN" Lab Results  Component Value Date   CHOL 98 02/08/2022   TRIG 60 02/08/2022   HDL 44 02/08/2022   CHOLHDL 2.2 02/08/2022   VLDL 12 02/08/2022   LDLCALC 42 02/08/2022    Current Medications: Current Facility-Administered Medications  Medication Dose Route Frequency Provider Last Rate Last Admin   acetaminophen (TYLENOL) tablet 650 mg  650 mg Oral Q6H PRN [name], NP       alum & mag hydroxide-simeth (MAALOX/MYLANTA) 200-200-20 MG/5ML suspension 30 mL  30 mL Oral Q4H  PRN [name], NP       ARIPiprazole ER (ABILIFY MAINTENA) injection 400 mg  400 mg Intramuscular Q28 days Ruger Saxer, [name], MD       aspirin EC tablet 81 mg  81 mg Oral Daily Gabriel Cirri F, NP   81 mg at 03/24/22 0856   benztropine (COGENTIN) tablet 1 mg  1 mg Oral Daily Gabriel Cirri F, NP   1 mg at 03/24/22 0747   divalproex (DEPAKOTE) DR tablet 500 mg  500 mg Oral Q12H Fern Canova T, MD       fenofibrate tablet 54 mg  54 mg Oral Daily Gabriel Cirri F, NP   54 mg at 03/24/22 0748   gemfibrozil (LOPID) tablet 600 mg  600 mg Oral BID Gabriel Cirri F, NP   600 mg at 03/24/22 0748   levothyroxine (SYNTHROID) tablet 88 mcg  88 mcg Oral Q0600 [name], NP   88 mcg at 03/24/22 0700   lisinopril (ZESTRIL) tablet 10 mg  10 mg Oral Daily Gabriel Cirri F, NP   10 mg at 03/24/22 0748   magnesium hydroxide (MILK OF MAGNESIA) suspension 30 mL  30 mL Oral Daily PRN Gabriel Cirri F, NP       metFORMIN (GLUCOPHAGE) tablet 1,000 mg  1,000 mg Oral BID WC Gabriel Cirri F, NP   1,000 mg at 03/24/22 0748   OLANZapine (ZYPREXA) tablet 10 mg  10  mg Oral QHS Marquerite Forsman T, MD       pioglitazone (ACTOS) tablet 45 mg  45 mg Oral Daily Gabriel Cirri F, NP   45 mg at 03/24/22 0749   pneumococcal 20-valent conjugate vaccine (PREVNAR 20) injection 0.5 mL  0.5 mL Intramuscular Tomorrow-1000 Maurice Ramseur T, MD       simvastatin (ZOCOR) tablet 20 mg  20 mg Oral QHS Gabriel Cirri F, NP   20 mg at 03/23/22 2123   temazepam (RESTORIL) capsule 15 mg  15 mg Oral QHS Gabriel Cirri F, NP   15 mg at 03/23/22 2123   PTA Medications: Medications Prior to Admission  Medication Sig Dispense Refill Last Dose   ARIPiprazole ER (ABILIFY MAINTENA) 400 MG SRER injection Inject 2 mLs (400 mg total) into the muscle every 28 (twenty-eight) days. Next dose due 03/18/22 1 each 1    aspirin EC 81 MG tablet Take 1 tablet (81 mg total) by mouth daily. Swallow whole. 30 tablet 1    benztropine (COGENTIN) 1 MG tablet Take 1 tablet (1 mg total) by mouth daily. (Patient not taking: Reported on 03/09/2022) 30 tablet 1    fenofibrate 54 MG tablet Take 1 tablet (54 mg total) by mouth daily. 30 tablet 1    gemfibrozil (LOPID) 600 MG tablet Take 1 tablet (600 mg total) by mouth 2 (two) times daily. 60 tablet 1    levothyroxine (SYNTHROID) 88 MCG tablet Take 1 tablet (88 mcg total) by mouth daily. 30 tablet 1    lisinopril (ZESTRIL) 10 MG tablet Take 10 mg by mouth daily.      metFORMIN (GLUCOPHAGE) 1000 MG tablet Take 1 tablet (1,000 mg total) by mouth 2 (two) times daily with a meal. 60 tablet 1    pioglitazone (ACTOS) 45 MG tablet Take 1 tablet (45 mg total) by mouth daily. 30 tablet 1    simvastatin (ZOCOR) 20 MG tablet Take 1 tablet (20 mg total) by mouth at bedtime. 30 tablet 1    temazepam (RESTORIL) 15 MG capsule Take 1  capsule (15 mg total) by mouth at bedtime. 30 capsule 1     Musculoskeletal: Strength & Muscle Tone: within normal limits Gait & Station: normal Patient leans: N/A            Psychiatric Specialty Exam:  Presentation  General  Appearance: Appropriate for Environment  Eye Contact:Fair  Speech:Clear and Coherent  Speech Volume:Normal  Handedness:Right   Mood and Affect  Mood:Euthymic  Affect:Congruent   Thought Process  Thought Processes:Coherent  Duration of Psychotic Symptoms: Greater than six months  Past Diagnosis of Schizophrenia or Psychoactive disorder: Yes  Descriptions of Associations:Intact  Orientation:Full (Time, Place and Person)  Thought Content:Logical  Hallucinations:No data recorded Ideas of Reference:None  Suicidal Thoughts:No data recorded Homicidal Thoughts:No data recorded  Sensorium  Memory:Immediate Fair; Remote Fair  Judgment:Fair  Insight:Present   Executive Functions  Concentration:Fair  Attention Span:Fair  Recall:Fair  Fund of Knowledge:Fair  Language:Fair   Psychomotor Activity  Psychomotor Activity:No data recorded  Assets  Assets:Communication Skills; Financial Resources/Insurance; Housing; Social Support   Sleep  Sleep:No data recorded   Physical Exam: Physical Exam Vitals and nursing note reviewed.  Constitutional:      Appearance: Normal appearance.  HENT:     Head: Normocephalic and atraumatic.     Mouth/Throat:     Pharynx: Oropharynx is clear.  Eyes:     Pupils: Pupils are equal, round, and reactive to light.  Cardiovascular:     Rate and Rhythm: Normal rate and regular rhythm.  Pulmonary:     Effort: Pulmonary effort is normal.     Breath sounds: Normal breath sounds.  Abdominal:     General: Abdomen is flat.     Palpations: Abdomen is soft.  Musculoskeletal:        General: Normal range of motion.  Skin:    General: Skin is warm and dry.  Neurological:     General: No focal deficit present.     Mental Status: He is alert. Mental status is at baseline.  Psychiatric:        Attention and Perception: He is inattentive.        Mood and Affect: Mood is anxious. Affect is labile.        Speech: Speech is  tangential.        Behavior: Behavior is agitated. Behavior is not aggressive.        Thought Content: Thought content is paranoid.        Cognition and Memory: Memory is impaired.    Review of Systems  Constitutional: Negative.   HENT: Negative.    Eyes: Negative.   Respiratory: Negative.    Cardiovascular: Negative.   Gastrointestinal: Negative.   Musculoskeletal: Negative.   Skin: Negative.   Neurological: Negative.   Psychiatric/Behavioral:  Positive for depression. The patient is nervous/anxious and has insomnia.    Blood pressure 121/71, pulse (!) 111, temperature 98 F (36.7 C), temperature source Oral, resp. rate 18, height 6\' 4"  (1.93 m), weight 101.2 kg, SpO2 100 %. Body mass index is 27.14 kg/m.  Treatment Plan Summary: Medication management and Plan reviewed medication.  He is due for his Abilify maintainer shot which will be given.  That shot alone does not seem to be controlling his symptoms.  Restart Depakote which had previously been effective.  Patient also tells me that Zyprexa had been effective and in the past.  Zyprexa tends to be relatively benign as far as EKG so that would be a good addition start 10 mg at  night.  Patient has been saying that he wants to attend a group home for his living situation social work and treatment team will work on that.  He attended treatment team today and was appropriate in his interactions.  Observation Level/Precautions:  15 minute checks  Laboratory:  Chemistry Profile  Psychotherapy:    Medications:    Consultations:    Discharge Concerns:    Estimated LOS:  Other:     Physician Treatment Plan for Primary Diagnosis: Schizophrenia (HCC) Long Term Goal(s): Improvement in symptoms so as ready for discharge  Short Term Goals: Ability to verbalize feelings will improve, Ability to disclose and discuss suicidal ideas, Ability to demonstrate self-control will improve, Ability to identify and develop effective coping behaviors  will improve, and Compliance with prescribed medications will improve  Physician Treatment Plan for Secondary Diagnosis: Principal Problem:   Schizophrenia (HCC) Active Problems:   Type 2 diabetes mellitus without complications (HCC)  Long Term Goal(s): Improvement in symptoms so as ready for discharge  Short Term Goals: Ability to verbalize feelings will improve, Ability to disclose and discuss suicidal ideas, Ability to demonstrate self-control will improve, Ability to identify and develop effective coping behaviors will improve, Ability to maintain clinical measurements within normal limits will improve, and Compliance with prescribed medications will improve  I certify that inpatient services furnished can reasonably be expected to improve the patient's condition.    Mordecai RasmussenJohn Dharma Pare, MD 9/13/20233:27 PM

## 2022-03-24 NOTE — Group Note (Signed)
[facility] LCSW Group Therapy Note   Group Date: 03/24/2022 Start Time: 1330 End Time: 1430   Type of Therapy/Topic:  Group Therapy:  Emotion Regulation  Participation Level:  Active    Description of Group:    The purpose of this group is to assist patients in learning to regulate negative emotions and experience positive emotions. Patients will be guided to discuss ways in which they have been vulnerable to their negative emotions. These vulnerabilities will be juxtaposed with experiences of positive emotions or situations, and patients challenged to use positive emotions to combat negative ones. Special emphasis will be placed on coping with negative emotions in conflict situations, and patients will process healthy conflict resolution skills.  Therapeutic Goals: Patient will identify two positive emotions or experiences to reflect on in order to balance out negative emotions:  Patient will label two or more emotions that they find the most difficult to experience:  Patient will be able to demonstrate positive conflict resolution skills through discussion or role plays:   Summary of Patient Progress: Patient was present for the majority of the group progression. He defined emotional regulation as trying to keep calm and think happy thoughts. Pt was unable to identify any negative emotions but stated that he is calmer now because he got plenty of rest and ate a balanced meal. When asked about how he was feeling prior to admission, pt was unable to track and stated that he was going to leave.    Therapeutic Modalities:   Cognitive Behavioral Therapy Feelings Identification Dialectical Behavioral Therapy   Glenis Smoker, LCSW

## 2022-03-24 NOTE — [facility] Suicide Risk Assessment (Signed)
[facility] Admission Suicide Risk Assessment   Nursing information obtained from:  Patient Demographic factors:  Male, Caucasian, Low socioeconomic status, Living alone Current Mental Status:  NA Loss Factors:  Financial problems / change in socioeconomic status, Decline in physical health Historical Factors:  Family history of mental illness or substance abuse Risk Reduction Factors:  NA  Total Time spent with patient: 1 hour Principal Problem: Schizophrenia (HCC) Diagnosis:  Principal Problem:   Schizophrenia (HCC) Active Problems:   Type 2 diabetes mellitus without complications (HCC)  Subjective Data: Patient seen and chart reviewed.  Patient known from previous encounters.  Brought into the hospital initially because of worsening paranoia hostile bizarre behavior disruptive to the community.  Patient on interview today is tearful emotionally labile disorganized.  Denies suicidal intent or plan.  Has not acted out to harm himself here and has been cooperative with medication and treatment  Continued Clinical Symptoms:  Alcohol Use Disorder Identification Test Final Score (AUDIT): 1 The "Alcohol Use Disorders Identification Test", Guidelines for Use in Primary Care, Second Edition.  World Science writer [facility]). Score between 0-7:  no or low risk or alcohol related problems. Score between 8-15:  moderate risk of alcohol related problems. Score between 16-19:  high risk of alcohol related problems. Score 20 or above:  warrants further diagnostic evaluation for alcohol dependence and treatment.   CLINICAL FACTORS:   Bipolar Disorder:   Mixed State Schizophrenia:   Depressive state   Musculoskeletal: Strength & Muscle Tone: within normal limits Gait & Station: normal Patient leans: N/A  Psychiatric Specialty Exam:  Presentation  General Appearance: Appropriate for Environment  Eye Contact:Fair  Speech:Clear and Coherent  Speech Volume:Normal  Handedness:Right   Mood  and Affect  Mood:Euthymic  Affect:Congruent   Thought Process  Thought Processes:Coherent  Descriptions of Associations:Intact  Orientation:Full (Time, Place and Person)  Thought Content:Logical  History of Schizophrenia/Schizoaffective disorder:Yes  Duration of Psychotic Symptoms:Greater than six months  Hallucinations:No data recorded Ideas of Reference:None  Suicidal Thoughts:No data recorded Homicidal Thoughts:No data recorded  Sensorium  Memory:Immediate Fair; Remote Fair  Judgment:Fair  Insight:Present   Executive Functions  Concentration:Fair  Attention Span:Fair  Recall:Fair  Fund of Knowledge:Fair  Language:Fair   Psychomotor Activity  Psychomotor Activity:No data recorded  Assets  Assets:Communication Skills; Financial Resources/Insurance; Housing; Social Support   Sleep  Sleep:No data recorded   Physical Exam: Physical Exam Vitals and nursing note reviewed.  Constitutional:      Appearance: Normal appearance.  HENT:     Head: Normocephalic and atraumatic.     Mouth/Throat:     Pharynx: Oropharynx is clear.  Eyes:     Pupils: Pupils are equal, round, and reactive to light.  Cardiovascular:     Rate and Rhythm: Normal rate and regular rhythm.  Pulmonary:     Effort: Pulmonary effort is normal.     Breath sounds: Normal breath sounds.  Abdominal:     General: Abdomen is flat.     Palpations: Abdomen is soft.  Musculoskeletal:        General: Normal range of motion.  Skin:    General: Skin is warm and dry.  Neurological:     General: No focal deficit present.     Mental Status: He is alert. Mental status is at baseline.  Psychiatric:        Attention and Perception: He is inattentive.        Mood and Affect: Mood normal. Affect is labile and inappropriate.  Speech: Speech is tangential.        Behavior: Behavior is agitated. Behavior is not aggressive.        Thought Content: Thought content is paranoid and  delusional.    Review of Systems  Constitutional: Negative.   HENT: Negative.    Eyes: Negative.   Respiratory: Negative.    Cardiovascular: Negative.   Gastrointestinal: Negative.   Musculoskeletal: Negative.   Skin: Negative.   Neurological: Negative.   Psychiatric/Behavioral:  Positive for depression. Negative for suicidal ideas. The patient is nervous/anxious and has insomnia.    Blood pressure 121/71, pulse (!) 111, temperature 98 F (36.7 C), temperature source Oral, resp. rate 18, height 6\' 4"  (1.93 m), weight 101.2 kg, SpO2 100 %. Body mass index is 27.14 kg/m.   COGNITIVE FEATURES THAT CONTRIBUTE TO RISK:  Thought constriction (tunnel vision)    SUICIDE RISK:   Minimal: No identifiable suicidal ideation.  Patients presenting with no risk factors but with morbid ruminations; may be classified as minimal risk based on the severity of the depressive symptoms  PLAN OF CARE: Continue 15-minute checks.  Restart medications for psychosis and mood stability.  Engage in individual and group therapy.  Check appropriate labs.  Ongoing assessment of dangerousness prior to discharge  I certify that inpatient services furnished can reasonably be expected to improve the patient's condition.   , MD 03/24/2022, 3:25 PM

## 2022-03-24 NOTE — B[facility] Counselor (Signed)
CSW attempted contact with legal guardian, [name] [phone]) regarding completion of the PSA and SPE. Contact unable to be established but HIPPA compliant voicemail left with contact information for follow through.   Vilma Meckel. Algis Greenhouse, MSW, LCSW, LCAS 03/24/2022 3:36 PM

## 2022-03-24 NOTE — BH IP Treatment Plan (Signed)
Interdisciplinary Treatment and Diagnostic Plan Update  03/24/2022 Time of Session: 9:44AM Derrick Atkins MRN: [id number]  Principal Diagnosis: <principal problem not specified>  Secondary Diagnoses: Active Problems:   Schizophrenia ([place])   Current Medications:  Current Facility-Administered Medications  Medication Dose Route Frequency Provider Last Rate Last Admin   acetaminophen (TYLENOL) tablet 650 mg  650 mg Oral Q6H PRN [name], NP       alum & mag hydroxide-simeth (MAALOX/MYLANTA) 200-200-20 MG/5ML suspension 30 mL  30 mL Oral Q4H PRN Barthold, Louise F, NP       ARIPiprazole (ABILIFY) tablet 5 mg  5 mg Oral Daily Waldon Merl F, NP       aspirin EC tablet 81 mg  81 mg Oral Daily Waldon Merl F, NP   81 mg at 03/24/22 0856   benztropine (COGENTIN) tablet 1 mg  1 mg Oral Daily Waldon Merl F, NP   1 mg at 03/24/22 0747   fenofibrate tablet 54 mg  54 mg Oral Daily Waldon Merl F, NP   54 mg at 03/24/22 0748   gemfibrozil (LOPID) tablet 600 mg  600 mg Oral BID Waldon Merl F, NP   600 mg at 03/24/22 0748   levothyroxine (SYNTHROID) tablet 88 mcg  88 mcg Oral Q0600 [name], NP   88 mcg at 03/24/22 0700   lisinopril (ZESTRIL) tablet 10 mg  10 mg Oral Daily Waldon Merl F, NP   10 mg at 03/24/22 0748   magnesium hydroxide (MILK OF MAGNESIA) suspension 30 mL  30 mL Oral Daily PRN Waldon Merl F, NP       metFORMIN (GLUCOPHAGE) tablet 1,000 mg  1,000 mg Oral BID WC Waldon Merl F, NP   1,000 mg at 03/24/22 0748   pioglitazone (ACTOS) tablet 45 mg  45 mg Oral Daily Waldon Merl F, NP   45 mg at 03/24/22 0749   pneumococcal 20-valent conjugate vaccine (PREVNAR 20) injection 0.5 mL  0.5 mL Intramuscular Tomorrow-1000 [name], [name], MD       risperiDONE (RISPERDAL) tablet 2 mg  2 mg Oral BID Waldon Merl F, NP   2 mg at 03/24/22 0747   simvastatin (ZOCOR) tablet 20 mg  20 mg Oral QHS Waldon Merl F, NP   20 mg at 03/23/22  2123   temazepam (RESTORIL) capsule 15 mg  15 mg Oral QHS Waldon Merl F, NP   15 mg at 03/23/22 2123   PTA Medications: Medications Prior to Admission  Medication Sig Dispense Refill Last Dose   ARIPiprazole ER (ABILIFY MAINTENA) 400 MG SRER injection Inject 2 mLs (400 mg total) into the muscle every 28 (twenty-eight) days. Next dose due 03/18/22 1 each 1    aspirin EC 81 MG tablet Take 1 tablet (81 mg total) by mouth daily. Swallow whole. 30 tablet 1    benztropine (COGENTIN) 1 MG tablet Take 1 tablet (1 mg total) by mouth daily. (Patient not taking: Reported on 03/09/2022) 30 tablet 1    fenofibrate 54 MG tablet Take 1 tablet (54 mg total) by mouth daily. 30 tablet 1    gemfibrozil (LOPID) 600 MG tablet Take 1 tablet (600 mg total) by mouth 2 (two) times daily. 60 tablet 1    levothyroxine (SYNTHROID) 88 MCG tablet Take 1 tablet (88 mcg total) by mouth daily. 30 tablet 1    lisinopril (ZESTRIL) 10 MG tablet Take 10 mg by mouth daily.      metFORMIN (GLUCOPHAGE) 1000 MG tablet Take  1 tablet (1,000 mg total) by mouth 2 (two) times daily with a meal. 60 tablet 1    pioglitazone (ACTOS) 45 MG tablet Take 1 tablet (45 mg total) by mouth daily. 30 tablet 1    simvastatin (ZOCOR) 20 MG tablet Take 1 tablet (20 mg total) by mouth at bedtime. 30 tablet 1    temazepam (RESTORIL) 15 MG capsule Take 1 capsule (15 mg total) by mouth at bedtime. 30 capsule 1     Patient Stressors: Financial difficulties   Medication change or noncompliance    Patient Strengths: Psychologist, clinical for treatment/growth   Treatment Modalities: Medication Management, Group therapy, Case management,  1 to 1 session with clinician, Psychoeducation, Recreational therapy.   Physician Treatment Plan for Primary Diagnosis: <principal problem not specified> Long Term Goal(s):     Short Term Goals:    Medication Management: Evaluate patient's response, side effects, and tolerance of medication  regimen.  Therapeutic Interventions: 1 to 1 sessions, Unit Group sessions and Medication administration.  Evaluation of Outcomes: Not Met  Physician Treatment Plan for Secondary Diagnosis: Active Problems:   Schizophrenia ([place])  Long Term Goal(s):     Short Term Goals:       Medication Management: Evaluate patient's response, side effects, and tolerance of medication regimen.  Therapeutic Interventions: 1 to 1 sessions, Unit Group sessions and Medication administration.  Evaluation of Outcomes: Not Met   RN Treatment Plan for Primary Diagnosis: <principal problem not specified> Long Term Goal(s): Knowledge of disease and therapeutic regimen to maintain health will improve  Short Term Goals: Ability to remain free from injury will improve, Ability to verbalize frustration and anger appropriately will improve, Ability to demonstrate self-control, Ability to participate in decision making will improve, Ability to verbalize feelings will improve, Ability to disclose and discuss suicidal ideas, Ability to identify and develop effective coping behaviors will improve, and Compliance with prescribed medications will improve  Medication Management: RN will administer medications as ordered by provider, will assess and evaluate patient's response and provide education to patient for prescribed medication. RN will report any adverse and/or side effects to prescribing provider.  Therapeutic Interventions: 1 on 1 counseling sessions, Psychoeducation, Medication administration, Evaluate responses to treatment, Monitor vital signs and CBGs as ordered, Perform/monitor CIWA, COWS, AIMS and Fall Risk screenings as ordered, Perform wound care treatments as ordered.  Evaluation of Outcomes: Not Met   LCSW Treatment Plan for Primary Diagnosis: <principal problem not specified> Long Term Goal(s): Safe transition to appropriate next level of care at discharge, Engage patient in therapeutic group addressing  interpersonal concerns.  Short Term Goals: Engage patient in aftercare planning with referrals and resources, Increase social support, Increase ability to appropriately verbalize feelings, Increase emotional regulation, Facilitate acceptance of mental health diagnosis and concerns, and Increase skills for wellness and recovery  Therapeutic Interventions: Assess for all discharge needs, 1 to 1 time with Social worker, Explore available resources and support systems, Assess for adequacy in community support network, Educate family and significant other(s) on suicide prevention, Complete Psychosocial Assessment, Interpersonal group therapy.  Evaluation of Outcomes: Not Met   Progress in Treatment: Attending groups: No. Participating in groups: No. Taking medication as prescribed: Yes. Toleration medication: Yes. Family/Significant other contact made: No, will contact:  guardian, Denman George. Patient understands diagnosis: Yes. Discussing patient identified problems/goals with staff: Yes. Medical problems stabilized or resolved: Yes. Denies suicidal/homicidal ideation: Yes. Issues/concerns per patient self-inventory: No. Other: none.  New problem(s) identified: No, Describe:  none identified.  New Short Term/Long Term Goal(s): elimination of symptoms of psychosis, medication management for mood stabilization; elimination of SI thoughts; development of comprehensive mental wellness plan.  Patient Goals:  "I just feel like I don't have any friends here."  Discharge Plan or Barriers: CSW will assist pt with development of an appropriate aftercare/discharge plan. Currently pt is interested in group home placement.   Reason for Continuation of Hospitalization: Delusions  Hallucinations Medication stabilization Other; describe psychosis.  Estimated Length of Stay: 1-7 days  Last 3 [place] Suicide Severity Risk Score: Flowsheet Row Admission (Current) from 03/23/2022 in [facility] from 03/15/2022 in [facility] from 03/12/2022 in [facility] Error: Q3, 4, or 5 should not be populated when Q2 is No Low Risk High Risk       Last PHQ 2/9 Scores:     No data to display          Scribe for Treatment Team: Shirl Harris, LCSW 03/24/2022 10:27 AM

## 2022-03-24 NOTE — Plan of Care (Signed)
Pt endorses anxiety/depression/SI without a plan while here. Pt verbally contracts for safety. Pt denies HI/AVH. When asked if he had a plan while here pt stated I'm doing it now. This Clinical research associate asked what are you doing. Pt replied "whacking off." This writer told the pt that was appropriate behavior for the unit and needed to stop then promptly left the pt's room. Pt is up at 0134 in the morning and has no prn bedtime medication that can be give. Pt given education. Pt compliant with medication administration per MD orders. Pt give education, support, and encouragement. Pt being monitored q15 minute for safety per unit protocol. Plan of care ongoing.  Pt needs prn bedtime medication. Pt was up restlessly throughout the night. Pt only slept approximately 3 hours.  Problem: Coping: Goal: Level of anxiety will decrease Outcome: Not Progressing   Problem: Education: Goal: Emotional status will improve Outcome: Not Progressing

## 2022-03-25 DIAGNOSIS — F203 Undifferentiated schizophrenia: Secondary | ICD-10-CM | POA: Diagnosis not present

## 2022-03-25 LAB — LIPID PANEL
Cholesterol: 95 mg/dL (ref 0–200)
HDL: 42 mg/dL (ref >40)
LDL Cholesterol: 43 mg/dL (ref 0–99)
Total CHOL/HDL Ratio: 2.3 RATIO
Triglycerides: 52 mg/dL (ref <150)
VLDL: 10 mg/dL (ref 0–40)

## 2022-03-25 LAB — HEMOGLOBIN A1C
Hgb A1c MFr Bld: 6 % — ABNORMAL HIGH (ref 4.8–5.6)
Mean Plasma Glucose: 125.5 mg/dL

## 2022-03-25 LAB — TSH: TSH: 0.737 u[IU]/mL (ref 0.350–4.500)

## 2022-03-25 NOTE — Progress Notes (Signed)
Patient denies HI and AVH. He reports passive SI and says that he has been having some crying spells. Patient is observed to be pacing the hallways and occassionally staring into the nurses station. Patient does answer questions appropriately when asked. Patient is compliant with scheduled medications. He reports that he "got good rest last night". However, staff reports that patient slept 3 hours last night.

## 2022-03-25 NOTE — Progress Notes (Signed)
[facility] MD Progress Note  03/25/2022 3:26 PM Derrick Atkins  MRN:  [id number] Subjective: Patient seen for follow-up.  Awake and alert somewhat blunted and dysphoric affect.  Denies suicidal ideation.  Denies hallucinations.  Sluggish and slow but has not been having any of the disruptive behaviors that he had on his last hospitalization so far.  Compliant with medicine Principal Problem: Schizophrenia (HCC) Diagnosis: Principal Problem:   Schizophrenia (HCC) Active Problems:   Type 2 diabetes mellitus without complications (HCC)  Total Time spent with patient: 30 minutes  Past Psychiatric History: Past history of chronic mental illness schizophrenia or schizoaffective  Past Medical History:  Past Medical History:  Diagnosis Date   Cerebral palsy (HCC)    Depression    Diabetes mellitus without complication (HCC)    Hypertension    Kidney stones    Schizoaffective disorder (HCC)    Scoliosis     Past Surgical History:  Procedure Laterality Date   BOWEL RESECTION     LAPAROTOMY N/A 05/15/2020   Procedure: EXPLORATORY LAPAROTOMY;  Surgeon: Henrene Dodge, MD;  Location: [facility] ORS;  Service: General;  Laterality: N/A;   Family History:  Family History  Problem Relation Age of Onset   Heart disease Father    Heart attack Father    Family Psychiatric  History: See previous Social History:  Social History   Substance and Sexual Activity  Alcohol Use No     Social History   Substance and Sexual Activity  Drug Use No    Social History   Socioeconomic History   Marital status: Single    Spouse name: Not on file   Number of children: Not on file   Years of education: Not on file   Highest education level: Not on file  Occupational History   Not on file  Tobacco Use   Smoking status: Never   Smokeless tobacco: Never  Vaping Use   Vaping Use: Never used  Substance and Sexual Activity   Alcohol use: No   Drug use: No   Sexual activity: Not Currently  Other Topics  Concern   Not on file  Social History Narrative   Not on file   Social Determinants of Health   Financial Resource Strain: Not on file  Food Insecurity: Food Insecurity Present (03/23/2022)   Hunger Vital Sign    Worried About Running Out of Food in the Last Year: Often true    Ran Out of Food in the Last Year: Often true  Transportation Needs: Unmet Transportation Needs (03/23/2022)   PRAPARE - Administrator, Civil Service (Medical): Yes    Lack of Transportation (Non-Medical): Yes  Physical Activity: Not on file  Stress: Not on file  Social Connections: Not on file   Additional Social History:                         Sleep: Fair  Appetite:  Fair  Current Medications: Current Facility-Administered Medications  Medication Dose Route Frequency Provider Last Rate Last Admin   acetaminophen (TYLENOL) tablet 650 mg  650 mg Oral Q6H PRN Gabriel Cirri F, NP   650 mg at 03/25/22 1524   alum & mag hydroxide-simeth (MAALOX/MYLANTA) 200-200-20 MG/5ML suspension 30 mL  30 mL Oral Q4H PRN Gabriel Cirri F, NP       ARIPiprazole ER (ABILIFY MAINTENA) injection 400 mg  400 mg Intramuscular Q28 days Shresta Risden, [name], MD   400 mg at 03/25/22  1516   aspirin EC tablet 81 mg  81 mg Oral Daily [name], NP   81 mg at 03/25/22 4235   benztropine (COGENTIN) tablet 1 mg  1 mg Oral Daily Gabriel Cirri F, NP   1 mg at 03/25/22 0805   divalproex (DEPAKOTE) DR tablet 500 mg  500 mg Oral Q12H Corinda Ammon T, MD   500 mg at 03/25/22 0805   fenofibrate tablet 54 mg  54 mg Oral Daily Gabriel Cirri F, NP   54 mg at 03/25/22 0804   gemfibrozil (LOPID) tablet 600 mg  600 mg Oral BID Gabriel Cirri F, NP   600 mg at 03/25/22 0804   levothyroxine (SYNTHROID) tablet 88 mcg  88 mcg Oral Q0600 [name], NP   88 mcg at 03/25/22 0618   lisinopril (ZESTRIL) tablet 10 mg  10 mg Oral Daily Gabriel Cirri F, NP   10 mg at 03/25/22 0805   magnesium hydroxide (MILK OF  MAGNESIA) suspension 30 mL  30 mL Oral Daily PRN Gabriel Cirri F, NP       metFORMIN (GLUCOPHAGE) tablet 1,000 mg  1,000 mg Oral BID WC Gabriel Cirri F, NP   1,000 mg at 03/25/22 0804   OLANZapine (ZYPREXA) tablet 10 mg  10 mg Oral QHS Taelor Moncada T, MD   10 mg at 03/24/22 2129   pioglitazone (ACTOS) tablet 45 mg  45 mg Oral Daily Gabriel Cirri F, NP   45 mg at 03/25/22 0804   pneumococcal 20-valent conjugate vaccine (PREVNAR 20) injection 0.5 mL  0.5 mL Intramuscular Tomorrow-1000 Constancia Geeting T, MD       simvastatin (ZOCOR) tablet 20 mg  20 mg Oral QHS Gabriel Cirri F, NP   20 mg at 03/24/22 2129   temazepam (RESTORIL) capsule 15 mg  15 mg Oral QHS [name], NP   15 mg at 03/24/22 2129    Lab Results:  Results for orders placed or performed during the hospital encounter of 03/23/22 (from the past 48 hour(s))  Glucose, capillary     Status: Abnormal   Collection Time: 03/23/22  5:09 PM  Result Value Ref Range   Glucose-Capillary 128 (H) 70 - 99 mg/dL    Comment: Glucose reference range applies only to samples taken after fasting for at least 8 hours.  Hemoglobin A1c     Status: Abnormal   Collection Time: 03/25/22  6:49 AM  Result Value Ref Range   Hgb A1c MFr Bld 6.0 (H) 4.8 - 5.6 %    Comment: (NOTE) Pre diabetes:          5.7%-6.4%  Diabetes:              >6.4%  Glycemic control for   <7.0% adults with diabetes    Mean Plasma Glucose 125.5 mg/dL    Comment: Performed at [facility], [address], [place], [place] [address]  Lipid panel     Status: None   Collection Time: 03/25/22  6:49 AM  Result Value Ref Range   Cholesterol 95 0 - 200 mg/dL   Triglycerides 52 <315 mg/dL   HDL 42 >40 mg/dL   Total CHOL/HDL Ratio 2.3 RATIO   VLDL 10 0 - 40 mg/dL   LDL Cholesterol 43 0 - 99 mg/dL    Comment:        Total Cholesterol/HDL:CHD Risk Coronary Heart Disease Risk Table  Men   Women  1/2 Average Risk   3.4   3.3  Average Risk        5.0   4.4  2 X Average Risk   9.6   7.1  3 X Average Risk  23.4   11.0        Use the calculated Patient Ratio above and the CHD Risk Table to determine the patient's CHD Risk.        ATP III CLASSIFICATION (LDL):  <100     mg/dL   Optimal  951-884  mg/dL   Near or Above                    Optimal  130-159  mg/dL   Borderline  166-063  mg/dL   High  >016     mg/dL   Very High Performed at [facility], [address] [place], [place] [address]   TSH     Status: None   Collection Time: 03/25/22  6:49 AM  Result Value Ref Range   TSH 0.737 0.350 - 4.500 uIU/mL    Comment: Performed by a 3rd Generation assay with a functional sensitivity of <=0.01 uIU/mL. Performed at [facility], [address] [place], [place] [address]     Blood Alcohol level:  Lab Results  Component Value Date   [facility] <10 03/15/2022   ETH <10 03/12/2022    Metabolic Disorder Labs: Lab Results  Component Value Date   HGBA1C 6.0 (H) 03/25/2022   MPG 125.5 03/25/2022   MPG 154.2 02/08/2022   No results found for: "PROLACTIN" Lab Results  Component Value Date   CHOL 95 03/25/2022   TRIG 52 03/25/2022   HDL 42 03/25/2022   CHOLHDL 2.3 03/25/2022   VLDL 10 03/25/2022   LDLCALC 43 03/25/2022   LDLCALC 42 02/08/2022    Physical Findings: AIMS:  , ,  ,  ,    CIWA:    COWS:     Musculoskeletal: Strength & Muscle Tone: within normal limits Gait & Station: normal Patient leans: N/A  Psychiatric Specialty Exam:  Presentation  General Appearance: Appropriate for Environment  Eye Contact:Fair  Speech:Clear and Coherent  Speech Volume:Normal  Handedness:Right   Mood and Affect  Mood:Euthymic  Affect:Congruent   Thought Process  Thought Processes:Coherent  Descriptions of Associations:Intact  Orientation:Full (Time, Place and Person)  Thought Content:Logical  History of Schizophrenia/Schizoaffective disorder:Yes  Duration of Psychotic  Symptoms:Greater than six months  Hallucinations:No data recorded Ideas of Reference:None  Suicidal Thoughts:No data recorded Homicidal Thoughts:No data recorded  Sensorium  Memory:Immediate Fair; Remote Fair  Judgment:Fair  Insight:Present   Executive Functions  Concentration:Fair  Attention Span:Fair  Recall:Fair  Fund of Knowledge:Fair  Language:Fair   Psychomotor Activity  Psychomotor Activity:No data recorded  Assets  Assets:Communication Skills; Financial Resources/Insurance; Housing; Social Support   Sleep  Sleep:No data recorded   Physical Exam: Physical Exam Vitals and nursing note reviewed.  Constitutional:      Appearance: Normal appearance.  HENT:     Head: Normocephalic and atraumatic.     Mouth/Throat:     Pharynx: Oropharynx is clear.  Eyes:     Pupils: Pupils are equal, round, and reactive to light.  Cardiovascular:     Rate and Rhythm: Normal rate and regular rhythm.  Pulmonary:     Effort: Pulmonary effort is normal.     Breath sounds: Normal breath sounds.  Abdominal:     General: Abdomen is flat.  Palpations: Abdomen is soft.  Musculoskeletal:        General: Normal range of motion.  Skin:    General: Skin is warm and dry.  Neurological:     General: No focal deficit present.     Mental Status: He is alert. Mental status is at baseline.  Psychiatric:        Attention and Perception: Attention normal.        Mood and Affect: Mood normal. Affect is blunt.        Speech: Speech is delayed.        Behavior: Behavior is slowed.        Thought Content: Thought content normal.        Cognition and Memory: Cognition is impaired.    Review of Systems  Constitutional: Negative.   HENT: Negative.    Eyes: Negative.   Respiratory: Negative.    Cardiovascular: Negative.   Gastrointestinal: Negative.   Musculoskeletal: Negative.   Skin: Negative.   Neurological: Negative.   Psychiatric/Behavioral:  Positive for depression.  Negative for suicidal ideas.    Blood pressure 127/61, pulse 95, temperature 97.8 F (36.6 C), temperature source Oral, resp. rate 18, height 6\' 4"  (1.93 m), weight 101.2 kg, SpO2 100 %. Body mass index is 27.14 kg/m.   Treatment Plan Summary: Medication management and Plan restarted Depakote and olanzapine.  Tolerating okay.  Tried to talk with him a little bit today about whether he will still be agreeable to a group home.  He is indicating that he is ambivalent about it.  Strongly encouraged him to be cooperative with this as I really think it is the most important long-term plan.  , MD 03/25/2022, 3:26 PM

## 2022-03-25 NOTE — Progress Notes (Signed)
Recreation Therapy Notes  INPATIENT RECREATION TR PLAN  Patient Details Name: Derrick Atkins MRN: [id number] DOB: [birth year] Today's Date: 03/25/2022  Rec Therapy Plan Is patient appropriate for Therapeutic Recreation?: Yes Treatment times per week: at least 3 Estimated Length of Stay: 5-7 days TR Treatment/Interventions: Group participation (Comment)  Discharge Criteria Pt will be discharged from therapy if:: Discharged Treatment plan/goals/alternatives discussed and agreed upon by:: Patient/family  Discharge Summary     Liadan Guizar 03/25/2022, 1:18 PM

## 2022-03-25 NOTE — Group Note (Signed)
[facility] LCSW Group Therapy Note   Group Date: 03/25/2022 Start Time: 1500 End Time: 1600   Type of Therapy/Topic:  Group Therapy:  Balance in Life  Participation Level:  Minimal   Description of Group:    This group will address the concept of balance and how it feels and looks when one is unbalanced. Patients will be encouraged to process areas in their lives that are out of balance, and identify reasons for remaining unbalanced. Facilitators will guide patients utilizing problem- solving interventions to address and correct the stressor making their life unbalanced. Understanding and applying boundaries will be explored and addressed for obtaining  and maintaining a balanced life. Patients will be encouraged to explore ways to assertively make their unbalanced needs known to significant others in their lives, using other group members and facilitator for support and feedback.  Therapeutic Goals: Patient will identify two or more emotions or situations they have that consume much of in their lives. Patient will identify signs/triggers that life has become out of balance:  Patient will identify two ways to set boundaries in order to achieve balance in their lives:  Patient will demonstrate ability to communicate their needs through discussion and/or role plays  Summary of Patient Progress: Patient was present for the entirety of the group process. He agrees that balance means being stable. Loneliness was identified as some thing that can throw him off balance. He shared that isolation causes his loneliness and he stated that weeping is how he responded to this in the past.    Therapeutic Modalities:   Cognitive Behavioral Therapy Solution-Focused Therapy Assertiveness Training   Glenis Smoker, LCSW

## 2022-03-25 NOTE — Progress Notes (Signed)
Recreation Therapy Notes  INPATIENT RECREATION THERAPY ASSESSMENT  Patient Details Name: Derrick Atkins MRN: [id number] DOB: 1970/08/31 Today's Date: 03/25/2022       Information Obtained From: Patient  Able to Participate in Assessment/Interview: Yes  Patient Presentation: Responsive  Reason for Admission (Per Patient): Active Symptoms  Patient Stressors: Friends  Pharmacologist:   Film/video editor, Talk  Leisure Interests (2+):  Games - Video games, Music - Listen  Frequency of Recreation/Participation: Pharmacist, community Resources:  No  Community Resources:     Current Use:    If no, Barriers?:    Expressed Interest in [employer] Information: Yes  [employer] of Residence:  Film/video editor  Patient Main Form of Transportation: Other (Comment) (I dont go anywhere)  Patient Strengths:  N/A  Patient Identified Areas of Improvement:  Learn to communicate better  Patient Goal for Hospitalization:  Meet people, make friends  Current SI (including self-harm):  Yes (No plan)  Current HI:  No  Current AVH:  (I am not sure)  Staff Intervention Plan: Collaborate with Interdisciplinary Treatment Team, Group Attendance  Consent to Intern Participation: N/A  Derrick Atkins 03/25/2022, 1:18 PM

## 2022-03-25 NOTE — Progress Notes (Signed)
Recreation Therapy Notes   Date: 03/25/2022  Time: 10:10 am   Location: Courtyard     Behavioral response: N/A   Intervention Topic: Leisure   Discussion/Intervention: Patient refused to attend group.   Clinical Observations/Feedback:  Patient refused to attend group.   Cairo Lingenfelter LRT/CTRS         Lyndell Allaire 03/25/2022 12:28 PM

## 2022-03-25 NOTE — Plan of Care (Signed)
Pt does not endorses experiencing any anxiety/depression at this time. Pt does not endorses experiencing any SI/HI/AVH or pain at this time. Pt provided with support and encouragement. Pt cooperative with medication administration. Pt monitored q15 minutes for safety per unit policy. Plan of care ongoing.   Pt has slept a total of 2hrs and 45 minutes total.  Problem: Activity: Goal: Risk for activity intolerance will decrease Outcome: Progressing   Problem: Coping: Goal: Level of anxiety will decrease Outcome: Progressing

## 2022-03-26 DIAGNOSIS — F203 Undifferentiated schizophrenia: Secondary | ICD-10-CM | POA: Diagnosis not present

## 2022-03-26 MED ORDER — BENZOCAINE 10 % MT GEL
Freq: Three times a day (TID) | OROMUCOSAL | Status: DC | PRN
  Administered 2022-03-27 – 2022-03-28 (×4): 1 via OROMUCOSAL
  Filled 2022-03-26 (×2): qty 9

## 2022-03-26 NOTE — Progress Notes (Signed)
[facility] MD Progress Note  03/26/2022 2:07 PM Derrick Atkins  MRN:  [id number] Subjective: Follow-up 51 year old man with schizophrenia.  Patient has no new complaints.  Remains somewhat blunted in his affect speaks minimally but has not been aggressive and is compliant with medicine. Principal Problem: Schizophrenia (HCC) Diagnosis: Principal Problem:   Schizophrenia (HCC) Active Problems:   Type 2 diabetes mellitus without complications (HCC)  Total Time spent with patient: 30 minutes  Past Psychiatric History: Past history of schizophrenia with poor functioning outside the hospital.  Has ACT team services through [facility]  Past Medical History:  Past Medical History:  Diagnosis Date   Cerebral palsy (HCC)    Depression    Diabetes mellitus without complication (HCC)    Hypertension    Kidney stones    Schizoaffective disorder (HCC)    Scoliosis     Past Surgical History:  Procedure Laterality Date   BOWEL RESECTION     LAPAROTOMY N/A 05/15/2020   Procedure: EXPLORATORY LAPAROTOMY;  Surgeon: Henrene Dodge, MD;  Location: [facility] ORS;  Service: General;  Laterality: N/A;   Family History:  Family History  Problem Relation Age of Onset   Heart disease Father    Heart attack Father    Family Psychiatric  History: See previous Social History:  Social History   Substance and Sexual Activity  Alcohol Use No     Social History   Substance and Sexual Activity  Drug Use No    Social History   Socioeconomic History   Marital status: Single    Spouse name: Not on file   Number of children: Not on file   Years of education: Not on file   Highest education level: Not on file  Occupational History   Not on file  Tobacco Use   Smoking status: Never   Smokeless tobacco: Never  Vaping Use   Vaping Use: Never used  Substance and Sexual Activity   Alcohol use: No   Drug use: No   Sexual activity: Not Currently  Other Topics Concern   Not on file  Social History  Narrative   Not on file   Social Determinants of Health   Financial Resource Strain: Not on file  Food Insecurity: Food Insecurity Present (03/23/2022)   Hunger Vital Sign    Worried About Running Out of Food in the Last Year: Often true    Ran Out of Food in the Last Year: Often true  Transportation Needs: Unmet Transportation Needs (03/23/2022)   PRAPARE - Administrator, Civil Service (Medical): Yes    Lack of Transportation (Non-Medical): Yes  Physical Activity: Not on file  Stress: Not on file  Social Connections: Not on file   Additional Social History:                         Sleep: Fair  Appetite:  Fair  Current Medications: Current Facility-Administered Medications  Medication Dose Route Frequency Provider Last Rate Last Admin   acetaminophen (TYLENOL) tablet 650 mg  650 mg Oral Q6H PRN Gabriel Cirri F, NP   650 mg at 03/26/22 1316   alum & mag hydroxide-simeth (MAALOX/MYLANTA) 200-200-20 MG/5ML suspension 30 mL  30 mL Oral Q4H PRN Gabriel Cirri F, NP       ARIPiprazole ER (ABILIFY MAINTENA) injection 400 mg  400 mg Intramuscular Q28 days Vernia Teem T, MD   400 mg at 03/25/22 1516   aspirin EC tablet 81 mg  81 mg Oral Daily Gabriel Cirri F, NP   81 mg at 03/26/22 1016   benzocaine (ORAJEL) 10 % mucosal gel   Mouth/Throat TID PRN Edell Mesenbrink, [name], MD       benztropine (COGENTIN) tablet 1 mg  1 mg Oral Daily Gabriel Cirri F, NP   1 mg at 03/26/22 0709   divalproex (DEPAKOTE) DR tablet 500 mg  500 mg Oral Q12H Raheim Beutler T, MD   500 mg at 03/26/22 5400   fenofibrate tablet 54 mg  54 mg Oral Daily Gabriel Cirri F, NP   54 mg at 03/26/22 0814   gemfibrozil (LOPID) tablet 600 mg  600 mg Oral BID Gabriel Cirri F, NP   600 mg at 03/26/22 0813   levothyroxine (SYNTHROID) tablet 88 mcg  88 mcg Oral Q0600 [name], NP   88 mcg at 03/26/22 0814   lisinopril (ZESTRIL) tablet 10 mg  10 mg Oral Daily Gabriel Cirri F, NP   10 mg at  03/26/22 8676   magnesium hydroxide (MILK OF MAGNESIA) suspension 30 mL  30 mL Oral Daily PRN Gabriel Cirri F, NP       metFORMIN (GLUCOPHAGE) tablet 1,000 mg  1,000 mg Oral BID WC Gabriel Cirri F, NP   1,000 mg at 03/26/22 0708   OLANZapine (ZYPREXA) tablet 10 mg  10 mg Oral QHS Omarri Eich T, MD   10 mg at 03/25/22 2133   pioglitazone (ACTOS) tablet 45 mg  45 mg Oral Daily Gabriel Cirri F, NP   45 mg at 03/26/22 1950   pneumococcal 20-valent conjugate vaccine (PREVNAR 20) injection 0.5 mL  0.5 mL Intramuscular Tomorrow-1000 Kania Regnier T, MD       simvastatin (ZOCOR) tablet 20 mg  20 mg Oral QHS Gabriel Cirri F, NP   20 mg at 03/25/22 2133   temazepam (RESTORIL) capsule 15 mg  15 mg Oral QHS [name], NP   15 mg at 03/25/22 2133    Lab Results:  Results for orders placed or performed during the hospital encounter of 03/23/22 (from the past 48 hour(s))  Hemoglobin A1c     Status: Abnormal   Collection Time: 03/25/22  6:49 AM  Result Value Ref Range   Hgb A1c MFr Bld 6.0 (H) 4.8 - 5.6 %    Comment: (NOTE) Pre diabetes:          5.7%-6.4%  Diabetes:              >6.4%  Glycemic control for   <7.0% adults with diabetes    Mean Plasma Glucose 125.5 mg/dL    Comment: Performed at [facility], [address], [place], [place] [address]  Lipid panel     Status: None   Collection Time: 03/25/22  6:49 AM  Result Value Ref Range   Cholesterol 95 0 - 200 mg/dL   Triglycerides 52 <124 mg/dL   HDL 42 >58 mg/dL   Total CHOL/HDL Ratio 2.3 RATIO   VLDL 10 0 - 40 mg/dL   LDL Cholesterol 43 0 - 99 mg/dL    Comment:        Total Cholesterol/HDL:CHD Risk Coronary Heart Disease Risk Table                     Men   Women  1/2 Average Risk   3.4   3.3  Average Risk       5.0   4.4  2 X Average  Risk   9.6   7.1  3 X Average Risk  23.4   11.0        Use the calculated Patient Ratio above and the CHD Risk Table to determine the patient's CHD Risk.        ATP  III CLASSIFICATION (LDL):  <100     mg/dL   Optimal  233-007  mg/dL   Near or Above                    Optimal  130-159  mg/dL   Borderline  622-633  mg/dL   High  >354     mg/dL   Very High Performed at [facility], [address] [place], [place] [address]   TSH     Status: None   Collection Time: 03/25/22  6:49 AM  Result Value Ref Range   TSH 0.737 0.350 - 4.500 uIU/mL    Comment: Performed by a 3rd Generation assay with a functional sensitivity of <=0.01 uIU/mL. Performed at [facility], [address] [place], [place] [address]     Blood Alcohol level:  Lab Results  Component Value Date   [facility] <10 03/15/2022   ETH <10 03/12/2022    Metabolic Disorder Labs: Lab Results  Component Value Date   HGBA1C 6.0 (H) 03/25/2022   MPG 125.5 03/25/2022   MPG 154.2 02/08/2022   No results found for: "PROLACTIN" Lab Results  Component Value Date   CHOL 95 03/25/2022   TRIG 52 03/25/2022   HDL 42 03/25/2022   CHOLHDL 2.3 03/25/2022   VLDL 10 03/25/2022   LDLCALC 43 03/25/2022   LDLCALC 42 02/08/2022    Physical Findings: AIMS:  , ,  ,  ,    CIWA:    COWS:     Musculoskeletal: Strength & Muscle Tone: within normal limits Gait & Station: normal Patient leans: N/A  Psychiatric Specialty Exam:  Presentation  General Appearance: Appropriate for Environment  Eye Contact:Fair  Speech:Clear and Coherent  Speech Volume:Normal  Handedness:Right   Mood and Affect  Mood:Euthymic  Affect:Congruent   Thought Process  Thought Processes:Coherent  Descriptions of Associations:Intact  Orientation:Full (Time, Place and Person)  Thought Content:Logical  History of Schizophrenia/Schizoaffective disorder:Yes  Duration of Psychotic Symptoms:Greater than six months  Hallucinations:No data recorded Ideas of Reference:None  Suicidal Thoughts:No data recorded Homicidal Thoughts:No data recorded  Sensorium  Memory:Immediate Fair; Remote  Fair  Judgment:Fair  Insight:Present   Executive Functions  Concentration:Fair  Attention Span:Fair  Recall:Fair  Fund of Knowledge:Fair  Language:Fair   Psychomotor Activity  Psychomotor Activity:No data recorded  Assets  Assets:Communication Skills; Financial Resources/Insurance; Housing; Social Support   Sleep  Sleep:No data recorded   Physical Exam: Physical Exam Vitals and nursing note reviewed.  Constitutional:      Appearance: Normal appearance.  HENT:     Head: Normocephalic and atraumatic.     Mouth/Throat:     Pharynx: Oropharynx is clear.  Eyes:     Pupils: Pupils are equal, round, and reactive to light.  Cardiovascular:     Rate and Rhythm: Normal rate and regular rhythm.  Pulmonary:     Effort: Pulmonary effort is normal.     Breath sounds: Normal breath sounds.  Abdominal:     General: Abdomen is flat.     Palpations: Abdomen is soft.  Musculoskeletal:        General: Normal range of motion.  Skin:    General: Skin is warm and  dry.  Neurological:     General: No focal deficit present.     Mental Status: He is alert. Mental status is at baseline.  Psychiatric:        Attention and Perception: Attention normal.        Mood and Affect: Mood normal. Affect is blunt.        Speech: Speech is delayed.        Behavior: Behavior is slowed.        Thought Content: Thought content normal.        Cognition and Memory: Cognition is impaired.    Review of Systems  Constitutional: Negative.   HENT: Negative.    Eyes: Negative.   Respiratory: Negative.    Cardiovascular: Negative.   Gastrointestinal: Negative.   Musculoskeletal: Negative.   Skin: Negative.   Neurological: Negative.   Psychiatric/Behavioral:  The patient is nervous/anxious.    Blood pressure 126/60, pulse 99, temperature 98.4 F (36.9 C), temperature source Oral, resp. rate (!) 22, height 6\' 4"  (1.93 m), weight 101.2 kg, SpO2 99 %. Body mass index is 27.14  kg/m.   Treatment Plan Summary: Medication management and Plan patient is now on Depakote and olanzapine in addition to the Abilify.  He is not over sedated during the day and is up interacting with others and has not displayed any of his aggressive or disruptive behavior recently.  Reminded him that we are continuing to try to work on placement in a group home.  , MD 03/26/2022, 2:07 PM

## 2022-03-26 NOTE — [facility] Group Notes (Signed)
[facility] Group Notes:  (Nursing/MHT/Case Management/Adjunct)  Date:  03/26/2022  Time:  8:27 PM  Type of Therapy:   Wrap up  Participation Level:  Minimal  Participation Quality:  Appropriate  Affect:  Appropriate  Cognitive:  Alert  Insight:  Good  Engagement in Group:  Engaged and didn't talk much because of toothache he had told me early.  Modes of Intervention:  Support  Summary of Progress/Problems:  Mayra Neer 03/26/2022, 8:27 PM

## 2022-03-26 NOTE — Group Note (Signed)
[facility] LCSW Group Therapy Note   Group Date: 03/26/2022 Start Time: 1300 End Time: 1400  Type of Therapy and Topic:  Group Therapy:  Feelings around Relapse and Recovery  Participation Level:  Minimal    Description of Group:    Patients in this group will discuss emotions they experience before and after a relapse. They will process how experiencing these feelings, or avoidance of experiencing them, relates to having a relapse. Facilitator will guide patients to explore emotions they have related to recovery. Patients will be encouraged to process which emotions are more powerful. They will be guided to discuss the emotional reaction significant others in their lives may have to patients' relapse or recovery. Patients will be assisted in exploring ways to respond to the emotions of others without this contributing to a relapse.  Therapeutic Goals: Patient will identify two or more emotions that lead to relapse for them:  Patient will identify two emotions that result when they relapse:  Patient will identify two emotions related to recovery:  Patient will demonstrate ability to communicate their needs through discussion and/or role plays.   Summary of Patient Progress:  Patient was present for the entirety of group session. Patient participated in opening and closing remarks. However, patient did not contribute at all to the topic of discussion despite encouraged participation.   Patient unable to understand concepts discussed in groups.    Therapeutic Modalities:   Cognitive Behavioral Therapy Solution-Focused Therapy Assertiveness Training Relapse Prevention Therapy   Corky Crafts, [place]

## 2022-03-26 NOTE — Plan of Care (Signed)
Pt denies experiencing any anxiety/depression at this time. Pt reports 10/10 right lower tooth pain. Orajel given and found helpful. Pt denies experiencing any SI/HI/AVH at this time. Pt reports he hopes to gets some sleep tonight because he hasn't been sleeping well. Pt shares the tooth pain is a little bit why he has not slept. Pt also makes the statement that he takes his medication while here because he is on a schedule, that having a schedule and someone making sure he takes his meds is helpful. Pt shares he doesn't have a schedule at home or someone to given him his meds and that he really needs a schedule. Pt is calm and cooperative at this time. Pt is medication compliant. Pt is monitored q15 minute safety checks per unit policy. Pt is given support and encouragement. Plan of care is ongoing.   Pt is frequently awake throughout the night, sleeping very little.    Problem: Coping: Goal: Level of anxiety will decrease Outcome: Progressing   Problem: Education: Goal: Emotional status will improve Outcome: Progressing

## 2022-03-26 NOTE — Progress Notes (Addendum)
Recreation Therapy Notes   Date: 03/26/2022    Time: 10:50 am    Location: Courtyard    Behavioral response: Appropriate,Redirection needed   Intervention Topic:  Social Skills    Discussion/Intervention:  Group content on today was focused on Pharmacist, community. The group defined social skills and identified ways they use social skills. Patients expressed what obstacles they face when trying to be social. Participants described the importance of social skills. The group listed ways to improve social skills and reasons to improve social skills. Individuals had an opportunity to learn new and improve social skills as well as identify their weaknesses.   Clinical Observations/Feedback: Patient came to group and was pleasant and cooperative during group. Patient lowered himself to the ground while playing basketball telling staff he fell. MHTs  helped patient up and escorted him inside,took his vitals. Patient made his way back outside to play basket ball again MHTs advise patient against it. Patient yelled "Fuck you" at [facility] while throwing up his middle finger and almost losing his balance;expressing he does what he wants. Patient got upset and eventually returned to his room.   Milan Clare LRT/CTRS             [place]hower 03/26/2022 12:35 PM
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
## 2022-03-26 NOTE — [facility] Suicide Risk Assessment (Signed)
[facility] INPATIENT:  Family/Significant Other Suicide Prevention Education  Suicide Prevention Education:  Education Completed; [name] [name]/guardian/family member [phone]), has been identified by the patient as the family member/significant other with whom the patient will be residing, and identified as the person(s) who will aid the patient in the event of a mental health crisis (suicidal ideations/suicide attempt).  With written consent from the patient, the family member/significant other has been provided the following suicide prevention education, prior to the and/or following the discharge of the patient.  The suicide prevention education provided includes the following: Suicide risk factors Suicide prevention and interventions National Suicide Hotline telephone number [facility] assessment telephone number [facility] Emergency Assistance 911 [facility] and/or Residential Mobile Crisis Unit telephone number  Request made of family/significant other to: Remove weapons (e.g., guns, rifles, knives), all items previously/currently identified as safety concern.   Remove drugs/medications (over-the-counter, prescriptions, illicit drugs), all items previously/currently identified as a safety concern.  The family member/significant other verbalizes understanding of the suicide prevention education information provided.  The family member/significant other agrees to remove the items of safety concern listed above.  [name] shares a number of concerns around the pt including safety for others. She states that pt has been threatening his neighbors and family, including her. [name] expresses that pt needs to be somewhere for an extended period of time. She also shares that she plans to giver up guardianship over the pt.   Glenis Smoker 03/26/2022, 3:19 PM

## 2022-03-27 DIAGNOSIS — F200 Paranoid schizophrenia: Secondary | ICD-10-CM

## 2022-03-27 NOTE — Plan of Care (Signed)
Patient has been ambulating around unit this morning with no complaints noted. He did come to the nurses station earlier and asked writer if we needed a stool sample from him. Writer asked patient if he had been experiencing any GI distress or blood in the stool and he said no. Writer redirected pt. Denies any current anxiety/depression/SI/HI/AVH. Pt was compliant with medications this morning. Will continue q72min checks and monitor.   Problem: Clinical Measurements: Goal: Will remain free from infection Outcome: Progressing   Problem: Coping: Goal: Level of anxiety will decrease Outcome: Progressing

## 2022-03-27 NOTE — Progress Notes (Signed)
[facility] MD Progress Note  03/27/2022 10:57 AM Derrick Atkins  MRN:  [id number] Subjective: Derrick Atkins is seen on rounds.  He states that he feels okay.  His biggest complaint is not getting enough food on his tray.  He denies any side effects from his medications.  He denies any auditory or visual hallucinations.  Principal Problem: Schizophrenia (HCC) Diagnosis: Principal Problem:   Schizophrenia (HCC) Active Problems:   Type 2 diabetes mellitus without complications (HCC)  Total Time spent with patient: 15 minutes  Past Psychiatric History: Past history of schizophrenia with poor functioning outside the hospital.  Has ACT team services through [facility]  Past Medical History:  Past Medical History:  Diagnosis Date   Cerebral palsy (HCC)    Depression    Diabetes mellitus without complication (HCC)    Hypertension    Kidney stones    Schizoaffective disorder (HCC)    Scoliosis     Past Surgical History:  Procedure Laterality Date   BOWEL RESECTION     LAPAROTOMY N/A 05/15/2020   Procedure: EXPLORATORY LAPAROTOMY;  Surgeon: Henrene Dodge, MD;  Location: [facility] ORS;  Service: General;  Laterality: N/A;   Family History:  Family History  Problem Relation Age of Onset   Heart disease Father    Heart attack Father     Social History:  Social History   Substance and Sexual Activity  Alcohol Use No     Social History   Substance and Sexual Activity  Drug Use No    Social History   Socioeconomic History   Marital status: Single    Spouse name: Not on file   Number of children: Not on file   Years of education: Not on file   Highest education level: Not on file  Occupational History   Not on file  Tobacco Use   Smoking status: Never   Smokeless tobacco: Never  Vaping Use   Vaping Use: Never used  Substance and Sexual Activity   Alcohol use: No   Drug use: No   Sexual activity: Not Currently  Other Topics Concern   Not on file  Social History Narrative   Not  on file   Social Determinants of Health   Financial Resource Strain: Not on file  Food Insecurity: Food Insecurity Present (03/23/2022)   Hunger Vital Sign    Worried About Running Out of Food in the Last Year: Often true    Ran Out of Food in the Last Year: Often true  Transportation Needs: Unmet Transportation Needs (03/23/2022)   PRAPARE - Administrator, Civil Service (Medical): Yes    Lack of Transportation (Non-Medical): Yes  Physical Activity: Not on file  Stress: Not on file  Social Connections: Not on file   Additional Social History:                         Sleep: Good  Appetite:  Good  Current Medications: Current Facility-Administered Medications  Medication Dose Route Frequency Provider Last Rate Last Admin   acetaminophen (TYLENOL) tablet 650 mg  650 mg Oral Q6H PRN Gabriel Cirri F, NP   650 mg at 03/27/22 0110   alum & mag hydroxide-simeth (MAALOX/MYLANTA) 200-200-20 MG/5ML suspension 30 mL  30 mL Oral Q4H PRN Gabriel Cirri F, NP       ARIPiprazole ER (ABILIFY MAINTENA) injection 400 mg  400 mg Intramuscular Q28 days [name], [name], MD   400 mg at 03/25/22 1516  aspirin EC tablet 81 mg  81 mg Oral Daily [name], NP   81 mg at 03/27/22 0820   benzocaine (ORAJEL) 10 % mucosal gel   Mouth/Throat TID PRN [name], [name], MD   1 Application at 03/27/22 0824   benztropine (COGENTIN) tablet 1 mg  1 mg Oral Daily Gabriel Cirri F, NP   1 mg at 03/27/22 0831   divalproex (DEPAKOTE) DR tablet 500 mg  500 mg Oral Q12H [name], [name], MD   500 mg at 03/27/22 0820   fenofibrate tablet 54 mg  54 mg Oral Daily Gabriel Cirri F, NP   54 mg at 03/27/22 0820   gemfibrozil (LOPID) tablet 600 mg  600 mg Oral BID Gabriel Cirri F, NP   600 mg at 03/27/22 0820   levothyroxine (SYNTHROID) tablet 88 mcg  88 mcg Oral Q0600 [name], NP   88 mcg at 03/27/22 0820   lisinopril (ZESTRIL) tablet 10 mg  10 mg Oral Daily Gabriel Cirri F,  NP   10 mg at 03/27/22 0820   magnesium hydroxide (MILK OF MAGNESIA) suspension 30 mL  30 mL Oral Daily PRN Gabriel Cirri F, NP       metFORMIN (GLUCOPHAGE) tablet 1,000 mg  1,000 mg Oral BID WC Gabriel Cirri F, NP   1,000 mg at 03/27/22 0820   OLANZapine (ZYPREXA) tablet 10 mg  10 mg Oral QHS [name], [name], MD   10 mg at 03/26/22 2111   pioglitazone (ACTOS) tablet 45 mg  45 mg Oral Daily Gabriel Cirri F, NP   45 mg at 03/27/22 5809   pneumococcal 20-valent conjugate vaccine (PREVNAR 20) injection 0.5 mL  0.5 mL Intramuscular Tomorrow-1000 [name], [name], MD       simvastatin (ZOCOR) tablet 20 mg  20 mg Oral QHS Gabriel Cirri F, NP   20 mg at 03/26/22 2111   temazepam (RESTORIL) capsule 15 mg  15 mg Oral QHS [name], NP   15 mg at 03/26/22 2111    Lab Results: No results found for this or any previous visit (from the past 48 hour(s)).  Blood Alcohol level:  Lab Results  Component Value Date   ETH <10 03/15/2022   ETH <10 03/12/2022    Metabolic Disorder Labs: Lab Results  Component Value Date   HGBA1C 6.0 (H) 03/25/2022   MPG 125.5 03/25/2022   MPG 154.2 02/08/2022   No results found for: "PROLACTIN" Lab Results  Component Value Date   CHOL 95 03/25/2022   TRIG 52 03/25/2022   HDL 42 03/25/2022   CHOLHDL 2.3 03/25/2022   VLDL 10 03/25/2022   LDLCALC 43 03/25/2022   LDLCALC 42 02/08/2022    Physical Findings: AIMS:  , ,  ,  ,    CIWA:    COWS:     Musculoskeletal: Strength & Muscle Tone: within normal limits Gait & Station: normal Patient leans: N/A  Psychiatric Specialty Exam:  Presentation  General Appearance: Appropriate for Environment  Eye Contact:Fair  Speech:Clear and Coherent  Speech Volume:Normal  Handedness:Right   Mood and Affect  Mood:Euthymic  Affect:Congruent   Thought Process  Thought Processes:Coherent  Descriptions of Associations:Intact  Orientation:Full (Time, Place and Person)  Thought  Content:Logical  History of Schizophrenia/Schizoaffective disorder:Yes  Duration of Psychotic Symptoms:Greater than six months  Hallucinations:No data recorded Ideas of Reference:None  Suicidal Thoughts:No data recorded Homicidal Thoughts:No data recorded  Sensorium  Memory:Immediate Fair; Remote Fair  Judgment:Fair  Insight:Present  Executive Functions  Concentration:Fair  Attention Span:Fair  [place]   Psychomotor Activity  Psychomotor Activity:No data recorded  Assets  Assets:Communication Skills; Financial Resources/Insurance; Housing; Social Support   Sleep  Sleep:No data recorded  Blood pressure (!) 142/77, pulse (!) 104, temperature 97.8 F (36.6 C), temperature source Oral, resp. rate 18, height 6\' 4"  (1.93 m), weight 101.2 kg, SpO2 100 %. Body mass index is 27.14 kg/m.   Treatment Plan Summary: Daily contact with patient to assess and evaluate symptoms and progress in treatment continue current medications.  He is on a regular diet.  Parks Ranger, DO 03/27/2022, 10:57 AM

## 2022-03-28 DIAGNOSIS — F200 Paranoid schizophrenia: Secondary | ICD-10-CM | POA: Diagnosis not present

## 2022-03-28 NOTE — Progress Notes (Signed)
[facility] MD Progress Note  03/28/2022 11:28 AM Derrick Atkins  MRN:  [id number] Subjective: Derrick Atkins was seen on rounds.  He has been compliant with his medications.  Nurses tell me that he has been very interested in the new patient.  He tells me that he needs a cold beer.  He does not have any other complaints.  Principal Problem: Schizophrenia (HCC) Diagnosis: Principal Problem:   Schizophrenia (HCC) Active Problems:   Type 2 diabetes mellitus without complications (HCC)  Total Time spent with patient: 15 minutes  Past Psychiatric History:  Past history of schizophrenia with poor functioning outside the hospital.  Has ACT team services through [facility]  Past Medical History:  Past Medical History:  Diagnosis Date   Cerebral palsy (HCC)    Depression    Diabetes mellitus without complication (HCC)    Hypertension    Kidney stones    Schizoaffective disorder (HCC)    Scoliosis     Past Surgical History:  Procedure Laterality Date   BOWEL RESECTION     LAPAROTOMY N/A 05/15/2020   Procedure: EXPLORATORY LAPAROTOMY;  Surgeon: Henrene Dodge, MD;  Location: [facility] ORS;  Service: General;  Laterality: N/A;   Family History:  Family History  Problem Relation Age of Onset   Heart disease Father    Heart attack Father     Social History:  Social History   Substance and Sexual Activity  Alcohol Use No     Social History   Substance and Sexual Activity  Drug Use No    Social History   Socioeconomic History   Marital status: Single    Spouse name: Not on file   Number of children: Not on file   Years of education: Not on file   Highest education level: Not on file  Occupational History   Not on file  Tobacco Use   Smoking status: Never   Smokeless tobacco: Never  Vaping Use   Vaping Use: Never used  Substance and Sexual Activity   Alcohol use: No   Drug use: No   Sexual activity: Not Currently  Other Topics Concern   Not on file  Social History Narrative    Not on file   Social Determinants of Health   Financial Resource Strain: Not on file  Food Insecurity: Food Insecurity Present (03/23/2022)   Hunger Vital Sign    Worried About Running Out of Food in the Last Year: Often true    Ran Out of Food in the Last Year: Often true  Transportation Needs: Unmet Transportation Needs (03/23/2022)   PRAPARE - Administrator, Civil Service (Medical): Yes    Lack of Transportation (Non-Medical): Yes  Physical Activity: Not on file  Stress: Not on file  Social Connections: Not on file   Additional Social History:                         Sleep: Good  Appetite:  Good  Current Medications: Current Facility-Administered Medications  Medication Dose Route Frequency Provider Last Rate Last Admin   acetaminophen (TYLENOL) tablet 650 mg  650 mg Oral Q6H PRN Gabriel Cirri F, NP   650 mg at 03/27/22 1326   alum & mag hydroxide-simeth (MAALOX/MYLANTA) 200-200-20 MG/5ML suspension 30 mL  30 mL Oral Q4H PRN Gabriel Cirri F, NP       ARIPiprazole ER (ABILIFY MAINTENA) injection 400 mg  400 mg Intramuscular Q28 days [name], [name], MD  400 mg at 03/25/22 1516   aspirin EC tablet 81 mg  81 mg Oral Daily [name], NP   81 mg at 03/28/22 0906   benzocaine (ORAJEL) 10 % mucosal gel   Mouth/Throat TID PRN [name], [name], MD   1 Application at 03/28/22 0853   benztropine (COGENTIN) tablet 1 mg  1 mg Oral Daily Gabriel Cirri F, NP   1 mg at 03/28/22 0849   divalproex (DEPAKOTE) DR tablet 500 mg  500 mg Oral Q12H [name], [name], MD   500 mg at 03/28/22 0848   fenofibrate tablet 54 mg  54 mg Oral Daily Gabriel Cirri F, NP   54 mg at 03/28/22 0849   gemfibrozil (LOPID) tablet 600 mg  600 mg Oral BID Gabriel Cirri F, NP   600 mg at 03/28/22 0849   levothyroxine (SYNTHROID) tablet 88 mcg  88 mcg Oral Q0600 [name], NP   88 mcg at 03/28/22 0848   lisinopril (ZESTRIL) tablet 10 mg  10 mg Oral Daily Gabriel Cirri  F, NP   10 mg at 03/28/22 0848   magnesium hydroxide (MILK OF MAGNESIA) suspension 30 mL  30 mL Oral Daily PRN Gabriel Cirri F, NP       metFORMIN (GLUCOPHAGE) tablet 1,000 mg  1,000 mg Oral BID WC Gabriel Cirri F, NP   1,000 mg at 03/28/22 0849   OLANZapine (ZYPREXA) tablet 10 mg  10 mg Oral QHS [name], [name], MD   10 mg at 03/27/22 2109   pioglitazone (ACTOS) tablet 45 mg  45 mg Oral Daily Gabriel Cirri F, NP   45 mg at 03/28/22 0848   pneumococcal 20-valent conjugate vaccine (PREVNAR 20) injection 0.5 mL  0.5 mL Intramuscular Tomorrow-1000 [name], [name], MD       simvastatin (ZOCOR) tablet 20 mg  20 mg Oral QHS Gabriel Cirri F, NP   20 mg at 03/27/22 2109   temazepam (RESTORIL) capsule 15 mg  15 mg Oral QHS [name], NP   15 mg at 03/27/22 2109    Lab Results: No results found for this or any previous visit (from the past 48 hour(s)).  Blood Alcohol level:  Lab Results  Component Value Date   ETH <10 03/15/2022   ETH <10 03/12/2022    Metabolic Disorder Labs: Lab Results  Component Value Date   HGBA1C 6.0 (H) 03/25/2022   MPG 125.5 03/25/2022   MPG 154.2 02/08/2022   No results found for: "PROLACTIN" Lab Results  Component Value Date   CHOL 95 03/25/2022   TRIG 52 03/25/2022   HDL 42 03/25/2022   CHOLHDL 2.3 03/25/2022   VLDL 10 03/25/2022   LDLCALC 43 03/25/2022   LDLCALC 42 02/08/2022    Physical Findings: AIMS:  , ,  ,  ,    CIWA:    COWS:     Musculoskeletal: Strength & Muscle Tone: within normal limits Gait & Station: normal Patient leans: N/A  Psychiatric Specialty Exam:  Presentation  General Appearance: Appropriate for Environment  Eye Contact:Fair  Speech:Clear and Coherent  Speech Volume:Normal  Handedness:Right   Mood and Affect  Mood:Euthymic  Affect:Congruent   Thought Process  Thought Processes:Coherent  Descriptions of Associations:Intact  Orientation:Full (Time, Place and Person)  Thought  Content:Logical  History of Schizophrenia/Schizoaffective disorder:Yes  Duration of Psychotic Symptoms:Greater than six months  Hallucinations:No data recorded Ideas of Reference:None  Suicidal Thoughts:No data recorded Homicidal Thoughts:No data recorded  Sensorium  Memory:Immediate Fair; Remote  [place]  Insight:Present   Executive Functions  Concentration:Fair  Attention Span:Fair  [place]   Psychomotor Activity  Psychomotor Activity:No data recorded  Assets  Assets:Communication Skills; Financial Resources/Insurance; Housing; Social Support   Sleep  Sleep:No data recorded  Blood pressure (!) 125/55, pulse 95, temperature 98.5 F (36.9 C), temperature source Oral, resp. rate 18, height 6\' 4"  (1.93 m), weight 101.2 kg, SpO2 100 %. Body mass index is 27.14 kg/m.   Treatment Plan Summary: Daily contact with patient to assess and evaluate symptoms and progress in treatment, Medication management, and Plan continue current medications.  Parks Ranger, DO 03/28/2022, 11:28 AM

## 2022-03-28 NOTE — Progress Notes (Signed)
Patient appears restless and confused to time and situation, he was pacing and looks restless he was frequently redirected as needed for safety. Patient complained of tooth ache was offered pain anesthetic as ordered with great relief. Patient denies SI/HI/AVH,will continue to monitor.

## 2022-03-28 NOTE — Plan of Care (Signed)
The patient was resistant to taking his AM medications stating, "I don't know you" and he walked away twice before agreeing to take them.  He stated that he had some thoughts of SI but said he had no plan.  He denied desire to hurt others.    Pt has had some difficulty with appropriate coping and boundaries today on the unit.  Pt is intrusive with some of the other patients, particularly a male patient who stays in her room much of the day.  He was observed attempting to peer into the patient's room while she slept, at least twice, and was immediately redirected by staff. He became angry whenever redirected and swore and gestured at staff.   Pt also became agitated when he wanted to watch something other than football and it was reported to this Probation officer that he attempted to charge at a staff member who redirected him. No injuries were reported.    After dinner Pt apologized for attempting to charge the staff member and was calm and cooperative when this writer approached him about taking his medications.

## 2022-03-29 DIAGNOSIS — F203 Undifferentiated schizophrenia: Secondary | ICD-10-CM | POA: Diagnosis not present

## 2022-03-29 NOTE — Progress Notes (Signed)
[facility] MD Progress Note  03/29/2022 5:09 PM Derrick Atkins  MRN:  [id number] Subjective: Patient seen and chart reviewed.  Patient had no complaints today.  Made little eye contact with me.  Withdrawn much of the time.  Had some irritable episodes over the weekend.  He denies that he is having any hallucinations. Principal Problem: Schizophrenia ([place]) Diagnosis: Principal Problem:   Schizophrenia ([place]) Active Problems:   Type 2 diabetes mellitus without complications ([place])  Total Time spent with patient: 30 minutes  Past Psychiatric History: Past history of schizophrenia  Past Medical History:  Past Medical History:  Diagnosis Date   Cerebral palsy ([place])    Depression    Diabetes mellitus without complication ([place])    Hypertension    Kidney stones    Schizoaffective disorder ([place])    Scoliosis     Past Surgical History:  Procedure Laterality Date   BOWEL RESECTION     LAPAROTOMY N/A 05/15/2020   Procedure: EXPLORATORY LAPAROTOMY;  Surgeon: Olean Ree, MD;  Location: [facility] ORS;  Service: General;  Laterality: N/A;   Family History:  Family History  Problem Relation Age of Onset   Heart disease Father    Heart attack Father    Family Psychiatric  History: See previous Social History:  Social History   Substance and Sexual Activity  Alcohol Use No     Social History   Substance and Sexual Activity  Drug Use No    Social History   Socioeconomic History   Marital status: Single    Spouse name: Not on file   Number of children: Not on file   Years of education: Not on file   Highest education level: Not on file  Occupational History   Not on file  Tobacco Use   Smoking status: Never   Smokeless tobacco: Never  Vaping Use   Vaping Use: Never used  Substance and Sexual Activity   Alcohol use: No   Drug use: No   Sexual activity: Not Currently  Other Topics Concern   Not on file  Social History Narrative   Not on file   Social Determinants of Health    Financial Resource Strain: Not on file  Food Insecurity: Food Insecurity Present (03/23/2022)   Hunger Vital Sign    Worried About Running Out of Food in the Last Year: Often true    Ran Out of Food in the Last Year: Often true  Transportation Needs: Unmet Transportation Needs (03/23/2022)   PRAPARE - Hydrologist (Medical): Yes    Lack of Transportation (Non-Medical): Yes  Physical Activity: Not on file  Stress: Not on file  Social Connections: Not on file   Additional Social History:                         Sleep: Fair  Appetite:  Fair  Current Medications: Current Facility-Administered Medications  Medication Dose Route Frequency Provider Last Rate Last Admin   acetaminophen (TYLENOL) tablet 650 mg  650 mg Oral Q6H PRN Waldon Merl F, NP   650 mg at 03/29/22 1648   alum & mag hydroxide-simeth (MAALOX/MYLANTA) 200-200-20 MG/5ML suspension 30 mL  30 mL Oral Q4H PRN Waldon Merl F, NP       ARIPiprazole ER (ABILIFY MAINTENA) injection 400 mg  400 mg Intramuscular Q28 days Jalaysia Lobb T, MD   400 mg at 03/25/22 1516   aspirin EC tablet 81 mg  81 mg  Oral Daily [name], NP   81 mg at 03/29/22 0830   benzocaine (ORAJEL) 10 % mucosal gel   Mouth/Throat TID PRN Ashdon Gillson, [name], MD   Given at 03/28/22 2221   benztropine (COGENTIN) tablet 1 mg  1 mg Oral Daily Gabriel Cirri F, NP   1 mg at 03/29/22 0830   divalproex (DEPAKOTE) DR tablet 500 mg  500 mg Oral Q12H Loryn Haacke T, MD   500 mg at 03/29/22 0830   fenofibrate tablet 54 mg  54 mg Oral Daily Gabriel Cirri F, NP   54 mg at 03/29/22 0831   gemfibrozil (LOPID) tablet 600 mg  600 mg Oral BID Gabriel Cirri F, NP   600 mg at 03/29/22 1649   levothyroxine (SYNTHROID) tablet 88 mcg  88 mcg Oral Q0600 [name], NP   88 mcg at 03/29/22 0831   lisinopril (ZESTRIL) tablet 10 mg  10 mg Oral Daily Gabriel Cirri F, NP   10 mg at 03/29/22 0830   magnesium hydroxide  (MILK OF MAGNESIA) suspension 30 mL  30 mL Oral Daily PRN Gabriel Cirri F, NP       metFORMIN (GLUCOPHAGE) tablet 1,000 mg  1,000 mg Oral BID WC Gabriel Cirri F, NP   1,000 mg at 03/29/22 1650   OLANZapine (ZYPREXA) tablet 10 mg  10 mg Oral QHS Denette Hass T, MD   10 mg at 03/28/22 2120   pioglitazone (ACTOS) tablet 45 mg  45 mg Oral Daily Gabriel Cirri F, NP   45 mg at 03/29/22 0831   pneumococcal 20-valent conjugate vaccine (PREVNAR 20) injection 0.5 mL  0.5 mL Intramuscular Tomorrow-1000 Crystalann Korf T, MD       simvastatin (ZOCOR) tablet 20 mg  20 mg Oral QHS Gabriel Cirri F, NP   20 mg at 03/28/22 2120   temazepam (RESTORIL) capsule 15 mg  15 mg Oral QHS Gabriel Cirri F, NP   15 mg at 03/28/22 2120    Lab Results: No results found for this or any previous visit (from the past 48 hour(s)).  Blood Alcohol level:  Lab Results  Component Value Date   ETH <10 03/15/2022   ETH <10 03/12/2022    Metabolic Disorder Labs: Lab Results  Component Value Date   HGBA1C 6.0 (H) 03/25/2022   MPG 125.5 03/25/2022   MPG 154.2 02/08/2022   No results found for: "PROLACTIN" Lab Results  Component Value Date   CHOL 95 03/25/2022   TRIG 52 03/25/2022   HDL 42 03/25/2022   CHOLHDL 2.3 03/25/2022   VLDL 10 03/25/2022   LDLCALC 43 03/25/2022   LDLCALC 42 02/08/2022    Physical Findings: AIMS:  , ,  ,  ,    CIWA:    COWS:     Musculoskeletal: Strength & Muscle Tone: within normal limits Gait & Station: normal Patient leans: N/A  Psychiatric Specialty Exam:  Presentation  General Appearance: Appropriate for Environment  Eye Contact:Fair  Speech:Clear and Coherent  Speech Volume:Normal  Handedness:Right   Mood and Affect  Mood:Euthymic  Affect:Congruent   Thought Process  Thought Processes:Coherent  Descriptions of Associations:Intact  Orientation:Full (Time, Place and Person)  Thought Content:Logical  History of Schizophrenia/Schizoaffective  disorder:Yes  Duration of Psychotic Symptoms:Greater than six months  Hallucinations:No data recorded Ideas of Reference:None  Suicidal Thoughts:No data recorded Homicidal Thoughts:No data recorded  Sensorium  Memory:Immediate Fair; Remote Fair  Judgment:Fair  Insight:Present   Executive Functions  Concentration:Fair  Attention Span:Fair  Recall:Fair  Fund of Knowledge:Fair  Language:Fair   Psychomotor Activity  Psychomotor Activity:No data recorded  Assets  Assets:Communication Skills; Financial Resources/Insurance; Housing; Social Support   Sleep  Sleep:No data recorded   Physical Exam: Physical Exam Vitals and nursing note reviewed.  Constitutional:      Appearance: Normal appearance.  HENT:     Head: Normocephalic and atraumatic.     Mouth/Throat:     Pharynx: Oropharynx is clear.  Eyes:     Pupils: Pupils are equal, round, and reactive to light.  Cardiovascular:     Rate and Rhythm: Normal rate and regular rhythm.  Pulmonary:     Effort: Pulmonary effort is normal.     Breath sounds: Normal breath sounds.  Abdominal:     General: Abdomen is flat.     Palpations: Abdomen is soft.  Musculoskeletal:        General: Normal range of motion.  Skin:    General: Skin is warm and dry.  Neurological:     General: No focal deficit present.     Mental Status: He is alert. Mental status is at baseline.  Psychiatric:        Attention and Perception: He is inattentive.        Mood and Affect: Mood normal. Affect is blunt.        Speech: Speech is delayed.        Behavior: Behavior is slowed.        Thought Content: Thought content normal.    Review of Systems  Constitutional: Negative.   HENT: Negative.    Eyes: Negative.   Respiratory: Negative.    Cardiovascular: Negative.   Gastrointestinal: Negative.   Musculoskeletal: Negative.   Skin: Negative.   Neurological: Negative.   Psychiatric/Behavioral: Negative.     Blood pressure (!)  126/59, pulse 64, temperature 97.7 F (36.5 C), temperature source Oral, resp. rate 18, height 6\' 4"  (1.93 m), weight 101.2 kg, SpO2 (!) 55 %. Body mass index is 27.14 kg/m.   Treatment Plan Summary: Medication management and Plan  sullen nd withdrawn behavior.  Still disorganized thinking.  Review medication.  Consider possible increase in antipsychotic.  Check Depakote level tomorrow morning.  Still working on discharge planning  , MD 03/29/2022, 5:09 PM

## 2022-03-29 NOTE — BH IP Treatment Plan (Signed)
Interdisciplinary Treatment and Diagnostic Plan Update  03/29/2022 Time of Session: 08:30 OSHER THU MRN: [id number]  Principal Diagnosis: Schizophrenia [facility])  Secondary Diagnoses: Principal Problem:   Schizophrenia (Derrick Atkins) Active Problems:   Type 2 diabetes mellitus without complications ([name])   Current Medications:  Current Facility-Administered Medications  Medication Dose Route Frequency Provider Last Rate Last Admin   acetaminophen (TYLENOL) tablet 650 mg  650 mg Oral Q6H PRN Waldon Merl F, NP   650 mg at 03/28/22 2221   alum & mag hydroxide-simeth (MAALOX/MYLANTA) 200-200-20 MG/5ML suspension 30 mL  30 mL Oral Q4H PRN [name], NP       ARIPiprazole ER (ABILIFY MAINTENA) injection 400 mg  400 mg Intramuscular Q28 days [name], [name], MD   400 mg at 03/25/22 1516   aspirin EC tablet 81 mg  81 mg Oral Daily Waldon Merl F, NP   81 mg at 03/29/22 0830   benzocaine (ORAJEL) 10 % mucosal gel   Mouth/Throat TID PRN [name], [name], MD   Given at 03/28/22 2221   benztropine (COGENTIN) tablet 1 mg  1 mg Oral Daily Waldon Merl F, NP   1 mg at 03/29/22 0830   divalproex (DEPAKOTE) DR tablet 500 mg  500 mg Oral Q12H [name], [name], MD   500 mg at 03/29/22 0830   fenofibrate tablet 54 mg  54 mg Oral Daily Waldon Merl F, NP   54 mg at 03/29/22 0831   gemfibrozil (LOPID) tablet 600 mg  600 mg Oral BID Waldon Merl F, NP   600 mg at 03/29/22 [id number]   levothyroxine (SYNTHROID) tablet 88 mcg  88 mcg Oral Q0600 [name], NP   88 mcg at 03/29/22 0831   lisinopril (ZESTRIL) tablet 10 mg  10 mg Oral Daily Waldon Merl F, NP   10 mg at 03/29/22 0830   magnesium hydroxide (MILK OF MAGNESIA) suspension 30 mL  30 mL Oral Daily PRN Waldon Merl F, NP       metFORMIN (GLUCOPHAGE) tablet 1,000 mg  1,000 mg Oral BID WC Waldon Merl F, NP   1,000 mg at 03/29/22 0830   OLANZapine (ZYPREXA) tablet 10 mg  10 mg Oral QHS [name], [name], MD   10 mg at  03/28/22 2120   pioglitazone (ACTOS) tablet 45 mg  45 mg Oral Daily Waldon Merl F, NP   45 mg at 03/29/22 0831   pneumococcal 20-valent conjugate vaccine (PREVNAR 20) injection 0.5 mL  0.5 mL Intramuscular Tomorrow-1000 [name], [name], MD       simvastatin (ZOCOR) tablet 20 mg  20 mg Oral QHS Waldon Merl F, NP   20 mg at 03/28/22 2120   temazepam (RESTORIL) capsule 15 mg  15 mg Oral QHS Waldon Merl F, NP   15 mg at 03/28/22 2120   PTA Medications: Medications Prior to Admission  Medication Sig Dispense Refill Last Dose   ARIPiprazole ER (ABILIFY MAINTENA) 400 MG SRER injection Inject 2 mLs (400 mg total) into the muscle every 28 (twenty-eight) days. Next dose due 03/18/22 1 each 1    aspirin EC 81 MG tablet Take 1 tablet (81 mg total) by mouth daily. Swallow whole. 30 tablet 1    benztropine (COGENTIN) 1 MG tablet Take 1 tablet (1 mg total) by mouth daily. (Patient not taking: Reported on 03/09/2022) 30 tablet 1    fenofibrate 54 MG tablet Take 1 tablet (54 mg total) by mouth daily. 30 tablet 1  gemfibrozil (LOPID) 600 MG tablet Take 1 tablet (600 mg total) by mouth 2 (two) times daily. 60 tablet 1    levothyroxine (SYNTHROID) 88 MCG tablet Take 1 tablet (88 mcg total) by mouth daily. 30 tablet 1    lisinopril (ZESTRIL) 10 MG tablet Take 10 mg by mouth daily.      metFORMIN (GLUCOPHAGE) 1000 MG tablet Take 1 tablet (1,000 mg total) by mouth 2 (two) times daily with a meal. 60 tablet 1    pioglitazone (ACTOS) 45 MG tablet Take 1 tablet (45 mg total) by mouth daily. 30 tablet 1    simvastatin (ZOCOR) 20 MG tablet Take 1 tablet (20 mg total) by mouth at bedtime. 30 tablet 1    temazepam (RESTORIL) 15 MG capsule Take 1 capsule (15 mg total) by mouth at bedtime. 30 capsule 1     Patient Stressors: Financial difficulties   Medication change or noncompliance    Patient Strengths: Psychologist, clinical for treatment/growth   Treatment Modalities: Medication  Management, Group therapy, Case management,  1 to 1 session with clinician, Psychoeducation, Recreational therapy.   Physician Treatment Plan for Primary Diagnosis: Schizophrenia ([name]) Long Term Goal(s): Improvement in symptoms so as ready for discharge   Short Term Goals: Ability to verbalize feelings will improve Ability to disclose and discuss suicidal ideas Ability to demonstrate self-control will improve Ability to identify and develop effective coping behaviors will improve Ability to maintain clinical measurements within normal limits will improve Compliance with prescribed medications will improve  Medication Management: Evaluate patient's response, side effects, and tolerance of medication regimen.  Therapeutic Interventions: 1 to 1 sessions, Unit Group sessions and Medication administration.  Evaluation of Outcomes: Progressing  Physician Treatment Plan for Secondary Diagnosis: Principal Problem:   Schizophrenia ([place]) Active Problems:   Type 2 diabetes mellitus without complications ([name])  Long Term Goal(s): Improvement in symptoms so as ready for discharge   Short Term Goals: Ability to verbalize feelings will improve Ability to disclose and discuss suicidal ideas Ability to demonstrate self-control will improve Ability to identify and develop effective coping behaviors will improve Ability to maintain clinical measurements within normal limits will improve Compliance with prescribed medications will improve     Medication Management: Evaluate patient's response, side effects, and tolerance of medication regimen.  Therapeutic Interventions: 1 to 1 sessions, Unit Group sessions and Medication administration.  Evaluation of Outcomes: Progressing   RN Treatment Plan for Primary Diagnosis: Schizophrenia ([name]) Long Term Goal(s): Knowledge of disease and therapeutic regimen to maintain health will improve  Short Term Goals: Ability to remain free from injury will  improve, Ability to verbalize frustration and anger appropriately will improve, Ability to demonstrate self-control, Ability to participate in decision making will improve, Ability to verbalize feelings will improve, Ability to disclose and discuss suicidal ideas, Ability to identify and develop effective coping behaviors will improve, and Compliance with prescribed medications will improve  Medication Management: RN will administer medications as ordered by provider, will assess and evaluate patient's response and provide education to patient for prescribed medication. RN will report any adverse and/or side effects to prescribing provider.  Therapeutic Interventions: 1 on 1 counseling sessions, Psychoeducation, Medication administration, Evaluate responses to treatment, Monitor vital signs and CBGs as ordered, Perform/monitor CIWA, COWS, AIMS and Fall Risk screenings as ordered, Perform wound care treatments as ordered.  Evaluation of Outcomes: Progressing   LCSW Treatment Plan for Primary Diagnosis: Schizophrenia ([place]) Long Term Goal(s): Safe transition to appropriate next level  of care at discharge, Engage patient in therapeutic group addressing interpersonal concerns.  Short Term Goals: Engage patient in aftercare planning with referrals and resources, Increase social support, Increase ability to appropriately verbalize feelings, Increase emotional regulation, Facilitate acceptance of mental health diagnosis and concerns, and Increase skills for wellness and recovery  Therapeutic Interventions: Assess for all discharge needs, 1 to 1 time with Social worker, Explore available resources and support systems, Assess for adequacy in community support network, Educate family and significant other(s) on suicide prevention, Complete Psychosocial Assessment, Interpersonal group therapy.  Evaluation of Outcomes: Progressing   Progress in Treatment: Attending groups: Yes. Participating in groups:  Yes. Taking medication as prescribed: Yes. Toleration medication: Yes. Family/Significant other contact made: Yes, individual(s) contacted:  guardian, Alone [name]. Patient understands diagnosis: Yes. Discussing patient identified problems/goals with staff: Yes. Medical problems stabilized or resolved: Yes. Denies suicidal/homicidal ideation: Yes. Issues/concerns per patient self-inventory: No. Other: none.  New problem(s) identified: No, Describe:  none identified. 03/29/22 Update: No changes at this time.   New Short Term/Long Term Goal(s): elimination of symptoms of psychosis, medication management for mood stabilization; elimination of SI thoughts; development of comprehensive mental wellness plan. 03/29/22 Update: No changes at this time.   Patient Goals:  "I just feel like I don't have any friends here." 03/29/22 Update: No changes at this time.   Discharge Plan or Barriers: CSW will assist pt with development of an appropriate aftercare/discharge plan. Currently pt is interested in group home placement. 03/29/22 Update: Pt became agitated last night with both staff and peers.    Reason for Continuation of Hospitalization: Delusions  Hallucinations Medication stabilization Other; describe psychosis.   Estimated Length of Stay: 1-7 days 03/29/22 Update: No changes at this time.  Last 3 [place] Suicide Severity Risk Score: Flowsheet Row Admission (Current) from 03/23/2022 in [place] ED from 03/15/2022 in [facility] ED from 03/12/2022 in [place] No Risk Low Risk High Risk       Last PHQ 2/9 Scores:     No data to display          Scribe for Treatment Team: Shirl Harris, LCSW 03/29/2022 9:01 AM

## 2022-03-29 NOTE — Group Note (Signed)
LCSW Group Therapy Note   Group Date: 03/29/2022 Start Time: 1300 End Time: 1400   Type of Therapy and Topic:  Group Therapy: Boundaries  Participation Level:  Active  Description of Group: This group will address the use of boundaries in their personal lives. Patients will explore why boundaries are important, the difference between healthy and unhealthy boundaries, and negative and postive outcomes of different boundaries and will look at how boundaries can be crossed.  Patients will be encouraged to identify current boundaries in their own lives and identify what kind of boundary is being set. Facilitators will guide patients in utilizing problem-solving interventions to address and correct types boundaries being used and to address when no boundary is being used. Understanding and applying boundaries will be explored and addressed for obtaining and maintaining a balanced life. Patients will be encouraged to explore ways to assertively make their boundaries and needs known to significant others in their lives, using other group members and facilitator for role play, support, and feedback.  Therapeutic Goals:  1.  Patient will identify areas in their life where setting clear boundaries could be  used to improve their life.  2.  Patient will identify signs/triggers that a boundary is not being respected. 3.  Patient will identify two ways to set boundaries in order to achieve balance in  their lives: 4.  Patient will demonstrate ability to communicate their needs and set boundaries  through discussion and/or role plays  Summary of Patient Progress:  Patient was present for the entirety of the group session. Patient was an active listener and participated in the topic of discussion, provided helpful advice to others, and added nuance to topic of conversation. Patient became increasingly bothered by the topic, states he becomes bothered when others ask him to do things. States he feels as though  they are being demeaning. Patient was offered encouragement by the group.  Therapeutic Modalities:   Cognitive Behavioral Therapy Solution-Focused Therapy  [place], Shader 03/29/2022  3:26 PM

## 2022-03-29 NOTE — Progress Notes (Signed)
Recreation Therapy Notes   Date: 03/29/2022   Time: 10:20 am    Location: Craft room      Behavioral response: Appropriate   Intervention Topic: Coping Skills     Discussion/Intervention:  Group content on today was focused on coping skills. The group defined what coping skills are and when they can be used. Individuals described how they normally cope with thing and the coping skills they normally use. Patients expressed why it is important to cope with things and how not coping with things can affect you. The group participated in the intervention "Exploring coping skills" where they had a chance to test new coping skills they could use in the future.  Clinical Observations/Feedback: Patient came to group and provided no contributions to group.    Tavarious Freel LRT/CTRS         Emmie Frakes 03/29/2022 1:26 PM

## 2022-03-29 NOTE — Plan of Care (Signed)
Patient ambulating around the unit with a sad blunted affect. Patient denies SI,HI and AVH. Patient sates that he is not feeling good but does not elaborate on it. Compliant with medications. No aggressive behaviors noted. Attended groups. Support and encouragement given.

## 2022-03-29 NOTE — Progress Notes (Signed)
Patient is alert but noted with some confusion to situation, his thoughts are disorganized, speech is tangential, he was noted angry and irritable towards staff and peers. In a situation witnessed he snatched a remote control from a patient and started cursing at them using profanities he was subsequent advised by staff to stop the aggression he was not receptive , he became agitated, angry, hostile and threatening and was asked to leave the dayroom for the safety of other staff members.  Patient was administered nighttime medication he also complained of tooth pain and was given pain medication . 15 minutes safety checks maintained will continue to monitor

## 2022-03-30 DIAGNOSIS — F203 Undifferentiated schizophrenia: Secondary | ICD-10-CM | POA: Diagnosis not present

## 2022-03-30 LAB — VALPROIC ACID LEVEL: Valproic Acid Lvl: 71 ug/mL (ref 50.0–100.0)

## 2022-03-30 NOTE — Progress Notes (Signed)
[name] Progress Note  03/30/2022 4:22 PM Derrick Atkins  MRN:  [id number] Subjective: Follow-up 51 year old man with schizophrenia.  No new complaint.  Mostly interacting appropriately.  Going outside at times.  Not fighting with other patients.  Tends to get more revved up later in the evening when there is less stimulation. Principal Problem: Schizophrenia ([place]) Diagnosis: Principal Problem:   Schizophrenia ([place]) Active Problems:   Type 2 diabetes mellitus without complications ([place])  Total Time spent with patient: 20 minutes  Past Psychiatric History: Past history of schizophrenia with disruptive behavior at times  Past Medical History:  Past Medical History:  Diagnosis Date   Cerebral palsy ([place])    Depression    Diabetes mellitus without complication ([place])    Hypertension    Kidney stones    Schizoaffective disorder ([place])    Scoliosis     Past Surgical History:  Procedure Laterality Date   BOWEL RESECTION     LAPAROTOMY N/A 05/15/2020   Procedure: EXPLORATORY LAPAROTOMY;  Surgeon: Olean Ree, MD;  Location: [facility] ORS;  Service: General;  Laterality: N/A;   Family History:  Family History  Problem Relation Age of Onset   Heart disease Father    Heart attack Father    Family Psychiatric  History: See previous Social History:  Social History   Substance and Sexual Activity  Alcohol Use No     Social History   Substance and Sexual Activity  Drug Use No    Social History   Socioeconomic History   Marital status: Single    Spouse name: Not on file   Number of children: Not on file   Years of education: Not on file   Highest education level: Not on file  Occupational History   Not on file  Tobacco Use   Smoking status: Never   Smokeless tobacco: Never  Vaping Use   Vaping Use: Never used  Substance and Sexual Activity   Alcohol use: No   Drug use: No   Sexual activity: Not Currently  Other Topics Concern   Not on file  Social History Narrative    Not on file   Social Determinants of Health   Financial Resource Strain: Not on file  Food Insecurity: Food Insecurity Present (03/23/2022)   Hunger Vital Sign    Worried About Running Out of Food in the Last Year: Often true    Ran Out of Food in the Last Year: Often true  Transportation Needs: Unmet Transportation Needs (03/23/2022)   PRAPARE - Hydrologist (Medical): Yes    Lack of Transportation (Non-Medical): Yes  Physical Activity: Not on file  Stress: Not on file  Social Connections: Not on file   Additional Social History:                         Sleep: Fair  Appetite:  Fair  Current Medications: Current Facility-Administered Medications  Medication Dose Route Frequency Provider Last Rate Last Admin   acetaminophen (TYLENOL) tablet 650 mg  650 mg Oral Q6H PRN Waldon Merl F, NP   650 mg at 03/30/22 0215   alum & mag hydroxide-simeth (MAALOX/MYLANTA) 200-200-20 MG/5ML suspension 30 mL  30 mL Oral Q4H PRN Waldon Merl F, NP       ARIPiprazole ER (ABILIFY MAINTENA) injection 400 mg  400 mg Intramuscular Q28 days Steed Kanaan, [name], MD   400 mg at 03/25/22 1516   aspirin EC tablet 81  mg  81 mg Oral Daily [name], NP   81 mg at 03/30/22 7858   benzocaine (ORAJEL) 10 % mucosal gel   Mouth/Throat TID PRN Timothy Townsel, [name], MD   Given at 03/28/22 2221   benztropine (COGENTIN) tablet 1 mg  1 mg Oral Daily Gabriel Cirri F, NP   1 mg at 03/30/22 0814   divalproex (DEPAKOTE) DR tablet 500 mg  500 mg Oral Q12H Ramiyah Mcclenahan T, MD   500 mg at 03/30/22 8502   fenofibrate tablet 54 mg  54 mg Oral Daily Gabriel Cirri F, NP   54 mg at 03/30/22 0814   gemfibrozil (LOPID) tablet 600 mg  600 mg Oral BID Gabriel Cirri F, NP   600 mg at 03/30/22 7741   levothyroxine (SYNTHROID) tablet 88 mcg  88 mcg Oral Q0600 [name], NP   88 mcg at 03/30/22 0658   lisinopril (ZESTRIL) tablet 10 mg  10 mg Oral Daily Gabriel Cirri F, NP    10 mg at 03/30/22 0813   magnesium hydroxide (MILK OF MAGNESIA) suspension 30 mL  30 mL Oral Daily PRN Gabriel Cirri F, NP       metFORMIN (GLUCOPHAGE) tablet 1,000 mg  1,000 mg Oral BID WC Gabriel Cirri F, NP   1,000 mg at 03/30/22 0813   OLANZapine (ZYPREXA) tablet 10 mg  10 mg Oral QHS Raney Antwine T, MD   10 mg at 03/29/22 2100   pioglitazone (ACTOS) tablet 45 mg  45 mg Oral Daily Gabriel Cirri F, NP   45 mg at 03/30/22 2878   pneumococcal 20-valent conjugate vaccine (PREVNAR 20) injection 0.5 mL  0.5 mL Intramuscular Tomorrow-1000 Ramyah Pankowski T, MD       simvastatin (ZOCOR) tablet 20 mg  20 mg Oral QHS Gabriel Cirri F, NP   20 mg at 03/29/22 2101   temazepam (RESTORIL) capsule 15 mg  15 mg Oral QHS Gabriel Cirri F, NP   15 mg at 03/29/22 2100    Lab Results:  Results for orders placed or performed during the hospital encounter of 03/23/22 (from the past 48 hour(s))  Valproic acid level     Status: None   Collection Time: 03/30/22  7:16 AM  Result Value Ref Range   Valproic Acid Lvl 71 50.0 - 100.0 ug/mL    Comment: Performed at [facility], [address] [place], [place] [address]    Blood Alcohol level:  Lab Results  Component Value Date   [facility] <10 03/15/2022   ETH <10 03/12/2022    Metabolic Disorder Labs: Lab Results  Component Value Date   HGBA1C 6.0 (H) 03/25/2022   MPG 125.5 03/25/2022   MPG 154.2 02/08/2022   No results found for: "PROLACTIN" Lab Results  Component Value Date   CHOL 95 03/25/2022   TRIG 52 03/25/2022   HDL 42 03/25/2022   CHOLHDL 2.3 03/25/2022   VLDL 10 03/25/2022   LDLCALC 43 03/25/2022   LDLCALC 42 02/08/2022    Physical Findings: AIMS:  , ,  ,  ,    CIWA:    COWS:     Musculoskeletal: Strength & Muscle Tone: within normal limits Gait & Station: normal Patient leans: N/A  Psychiatric Specialty Exam:  Presentation  General Appearance: Appropriate for Environment  Eye Contact:Fair  Speech:Clear  and Coherent  Speech Volume:Normal  Handedness:Right   Mood and Affect  Mood:Euthymic  Affect:Congruent   Thought Process  Thought Processes:Coherent  Descriptions of Associations:Intact  Orientation:Full (Time, Place and Person)  Thought Content:Logical  History of Schizophrenia/Schizoaffective disorder:Yes  Duration of Psychotic Symptoms:Greater than six months  Hallucinations:No data recorded Ideas of Reference:None  Suicidal Thoughts:No data recorded Homicidal Thoughts:No data recorded  Sensorium  Memory:Immediate Fair; Remote Fair  Judgment:Fair  Insight:Present   Executive Functions  Concentration:Fair  Attention Span:Fair  Recall:Fair  Fund of Knowledge:Fair  Language:Fair   Psychomotor Activity  Psychomotor Activity:No data recorded  Assets  Assets:Communication Skills; Financial Resources/Insurance; Housing; Social Support   Sleep  Sleep:No data recorded   Physical Exam: Physical Exam Vitals and nursing note reviewed.  Constitutional:      Appearance: Normal appearance.  HENT:     Head: Normocephalic and atraumatic.     Mouth/Throat:     Pharynx: Oropharynx is clear.  Eyes:     Pupils: Pupils are equal, round, and reactive to light.  Cardiovascular:     Rate and Rhythm: Normal rate and regular rhythm.  Pulmonary:     Effort: Pulmonary effort is normal.     Breath sounds: Normal breath sounds.  Abdominal:     General: Abdomen is flat.     Palpations: Abdomen is soft.  Musculoskeletal:        General: Normal range of motion.  Skin:    General: Skin is warm and dry.  Neurological:     General: No focal deficit present.     Mental Status: He is alert. Mental status is at baseline.  Psychiatric:        Mood and Affect: Mood normal.        Thought Content: Thought content normal.    Review of Systems  Constitutional: Negative.   HENT: Negative.    Eyes: Negative.   Respiratory: Negative.    Cardiovascular: Negative.    Gastrointestinal: Negative.   Musculoskeletal: Negative.   Skin: Negative.   Neurological: Negative.   Psychiatric/Behavioral: Negative.     Blood pressure 115/70, pulse (!) 57, temperature 97.8 F (36.6 C), temperature source Oral, resp. rate 18, height 6\' 4"  (1.93 m), weight 101.2 kg, SpO2 (!) 75 %. Body mass index is 27.14 kg/m.   Treatment Plan Summary: Medication management and Plan no change to medication management.  Supportive counseling and therapy.  Encourage group attendance.  Continue working on finding a group home for discharge plan  , MD 03/30/2022, 4:22 PM

## 2022-03-30 NOTE — Progress Notes (Signed)
Patient noted staring in patient 25 room, staring at male patient as well. Male informed staff that noted patient was making her feel unsafe. Staff attempted to re-direct patient to go to his room or to the dayroom and to stop watching male patient. Noted patient began cursing and yelling at staff calling him the N word, and repeatedly swearing at staff. Patient did go to room. Would come out and peek his head out at staff then, go back in. Patient did come out for snacks and meds, went back to bed. No other issues at this time noted.  Encouragement and support provided. Safety checks maintained. Medications given as prescribed. Pt receptive and remains safe on unit with q 15 min check.s

## 2022-03-30 NOTE — Progress Notes (Signed)
Recreation Therapy Notes   Date: 03/30/2022  Time: 10:05am   Location: Courtyard   Behavioral response: Appropriate  Intervention Topic:  Wellness    Discussion/Intervention:  Group content today was focused on Wellness. The group defined wellness and some positive ways they make decisions for themselves. Individuals expressed reasons why they neglected any wellness in the past. Patients described ways to improve wellness skills in the future. The group explained what could happen if they did not do any wellness at all. Participants express how bad choices has affected them and others around them. Individual explained the importance of wellness. The group participated in the intervention "Testing my Wellness" where they had a chance to identify some of their weaknesses and strengths in wellness.  Clinical Observations/Feedback: Patient came to group and identified ways they participate in wellness outside of the hospital. Individual was social with peers and staff while participating in the intervention.    Aerika Groll LRT/CTRS         Addysen Louth 03/30/2022 11:49 AM

## 2022-03-30 NOTE — Progress Notes (Signed)
[facility] MD Progress Note  03/30/2022 10:56 AM Derrick Atkins  MRN:  [id number] Subjective: Follow-up patient with schizophrenia.  Patient seen and chart reviewed.  He was up out of his room outdoors listening to music attending social activity.  Denies any acute symptoms.  Says his mood is feeling okay today.  No evidence of acute suicidality or dangerousness.  He reports that his hallucinations are much improved.  Labs reviewed the new valproic level is 71 this morning which is fine.  Patient tells me that he is working with the staff and looking for a place to live. Principal Problem: Schizophrenia ([facility]) Diagnosis: Principal Problem:   Schizophrenia ([facility]) Active Problems:   Type 2 diabetes mellitus without complications ([facility])  Total Time spent with patient: 30 minutes  Past Psychiatric History: Past history of schizophrenia  Past Medical History:  Past Medical History:  Diagnosis Date   Cerebral palsy (H[facility])    Depression    Diabetes mellitus without complication (H[facility])    Hypertension    Kidney stones    Schizoaffective disorder ([facility])    Scoliosis     Past Surgical History:  Procedure Laterality Date   BOWEL RESECTION     LAPAROTOMY N/A 05/15/2020   Procedure: EXPLORATORY LAPAROTOMY;  Surgeon: Henrene Dodge, MD;  Location: [facility] ORS;  Service: General;  Laterality: N/A;   Family History:  Family History  Problem Relation Age of Onset   Heart disease Father    Heart attack Father    Family Psychiatric  History: See previous Social History:  Social History   Substance and Sexual Activity  Alcohol Use No     Social History   Substance and Sexual Activity  Drug Use No    Social History   Socioeconomic History   Marital status: Single    Spouse name: Not on file   Number of children: Not on file   Years of education: Not on file   Highest education level: Not on file  Occupational History   Not on file  Tobacco Use   Smoking status: Never   Smokeless tobacco:  Never  Vaping Use   Vaping Use: Never used  Substance and Sexual Activity   Alcohol use: No   Drug use: No   Sexual activity: Not Currently  Other Topics Concern   Not on file  Social History Narrative   Not on file   Social Determinants of Health   Financial Resource Strain: Not on file  Food Insecurity: Food Insecurity Present (03/23/2022)   Hunger Vital Sign    Worried About Running Out of Food in the Last Year: Often true    Ran Out of Food in the Last Year: Often true  Transportation Needs: Unmet Transportation Needs (03/23/2022)   PRAPARE - Administrator, Civil Service (Medical): Yes    Lack of Transportation (Non-Medical): Yes  Physical Activity: Not on file  Stress: Not on file  Social Connections: Not on file   Additional Social History:                         Sleep: Fair  Appetite:  Fair  Current Medications: Current Facility-Administered Medications  Medication Dose Route Frequency Provider Last Rate Last Admin   acetaminophen (TYLENOL) tablet 650 mg  650 mg Oral Q6H PRN Gabriel Cirri F, NP   650 mg at 03/30/22 0215   alum & mag hydroxide-simeth (MAALOX/MYLANTA) 200-200-20 MG/5ML suspension 30 mL  30 mL Oral  Q4H PRN [name], NP       ARIPiprazole ER (ABILIFY MAINTENA) injection 400 mg  400 mg Intramuscular Q28 days Finlay Mills, [name], MD   400 mg at 03/25/22 1516   aspirin EC tablet 81 mg  81 mg Oral Daily Gabriel Cirri F, NP   81 mg at 03/30/22 0814   benzocaine (ORAJEL) 10 % mucosal gel   Mouth/Throat TID PRN Maleaha Hughett, [name], MD   Given at 03/28/22 2221   benztropine (COGENTIN) tablet 1 mg  1 mg Oral Daily Gabriel Cirri F, NP   1 mg at 03/30/22 0814   divalproex (DEPAKOTE) DR tablet 500 mg  500 mg Oral Q12H Brinlee Gambrell T, MD   500 mg at 03/30/22 1610   fenofibrate tablet 54 mg  54 mg Oral Daily Gabriel Cirri F, NP   54 mg at 03/30/22 0814   gemfibrozil (LOPID) tablet 600 mg  600 mg Oral BID Gabriel Cirri F, NP    600 mg at 03/30/22 9604   levothyroxine (SYNTHROID) tablet 88 mcg  88 mcg Oral Q0600 [name], NP   88 mcg at 03/30/22 0658   lisinopril (ZESTRIL) tablet 10 mg  10 mg Oral Daily Gabriel Cirri F, NP   10 mg at 03/30/22 0813   magnesium hydroxide (MILK OF MAGNESIA) suspension 30 mL  30 mL Oral Daily PRN Gabriel Cirri F, NP       metFORMIN (GLUCOPHAGE) tablet 1,000 mg  1,000 mg Oral BID WC Gabriel Cirri F, NP   1,000 mg at 03/30/22 0813   OLANZapine (ZYPREXA) tablet 10 mg  10 mg Oral QHS Nalee Lightle T, MD   10 mg at 03/29/22 2100   pioglitazone (ACTOS) tablet 45 mg  45 mg Oral Daily Gabriel Cirri F, NP   45 mg at 03/30/22 5409   pneumococcal 20-valent conjugate vaccine (PREVNAR 20) injection 0.5 mL  0.5 mL Intramuscular Tomorrow-1000 Tamari Busic T, MD       simvastatin (ZOCOR) tablet 20 mg  20 mg Oral QHS Gabriel Cirri F, NP   20 mg at 03/29/22 2101   temazepam (RESTORIL) capsule 15 mg  15 mg Oral QHS Gabriel Cirri F, NP   15 mg at 03/29/22 2100    Lab Results:  Results for orders placed or performed during the hospital encounter of 03/23/22 (from the past 48 hour(s))  Valproic acid level     Status: None   Collection Time: 03/30/22  7:16 AM  Result Value Ref Range   Valproic Acid Lvl 71 50.0 - 100.0 ug/mL    Comment: Performed at [facility], [address] [place], [place] [address]    Blood Alcohol level:  Lab Results  Component Value Date   [facility] <10 03/15/2022   ETH <10 03/12/2022    Metabolic Disorder Labs: Lab Results  Component Value Date   HGBA1C 6.0 (H) 03/25/2022   MPG 125.5 03/25/2022   MPG 154.2 02/08/2022   No results found for: "PROLACTIN" Lab Results  Component Value Date   CHOL 95 03/25/2022   TRIG 52 03/25/2022   HDL 42 03/25/2022   CHOLHDL 2.3 03/25/2022   VLDL 10 03/25/2022   LDLCALC 43 03/25/2022   LDLCALC 42 02/08/2022    Physical Findings: AIMS:  , ,  ,  ,    CIWA:    COWS:     Musculoskeletal: Strength  & Muscle Tone: within normal limits Gait & Station: normal Patient leans: N/A  Psychiatric Specialty  Exam:  Presentation  General Appearance: Appropriate for Environment  Eye Contact:Fair  Speech:Clear and Coherent  Speech Volume:Normal  Handedness:Right   Mood and Affect  Mood:Euthymic  Affect:Congruent   Thought Process  Thought Processes:Coherent  Descriptions of Associations:Intact  Orientation:Full (Time, Place and Person)  Thought Content:Logical  History of Schizophrenia/Schizoaffective disorder:Yes  Duration of Psychotic Symptoms:Greater than six months  Hallucinations:No data recorded Ideas of Reference:None  Suicidal Thoughts:No data recorded Homicidal Thoughts:No data recorded  Sensorium  Memory:Immediate Fair; Remote Fair  Judgment:Fair  Insight:Present   Executive Functions  Concentration:Fair  Attention Span:Fair  [place]   Psychomotor Activity  Psychomotor Activity:No data recorded  Assets  Assets:Communication Skills; Financial Resources/Insurance; Housing; Social Support   Sleep  Sleep:No data recorded   Physical Exam: Physical Exam Vitals reviewed.  Constitutional:      Appearance: Normal appearance.  HENT:     Head: Normocephalic and atraumatic.     Mouth/Throat:     Pharynx: Oropharynx is clear.  Eyes:     Pupils: Pupils are equal, round, and reactive to light.  Cardiovascular:     Rate and Rhythm: Normal rate and regular rhythm.  Pulmonary:     Effort: Pulmonary effort is normal.     Breath sounds: Normal breath sounds.  Abdominal:     General: Abdomen is flat.     Palpations: Abdomen is soft.  Musculoskeletal:        General: Normal range of motion.  Skin:    General: Skin is warm and dry.  Neurological:     General: No focal deficit present.     Mental Status: He is alert. Mental status is at baseline.  Psychiatric:        Attention and Perception:  Attention normal.        Mood and Affect: Mood normal. Affect is blunt.        Speech: Speech normal.        Behavior: Behavior is cooperative.        Thought Content: Thought content normal.        Cognition and Memory: Cognition normal.    Review of Systems  Constitutional: Negative.   HENT: Negative.    Eyes: Negative.   Respiratory: Negative.    Cardiovascular: Negative.   Gastrointestinal: Negative.   Musculoskeletal: Negative.   Skin: Negative.   Neurological: Negative.   Psychiatric/Behavioral: Negative.     Blood pressure 115/70, pulse (!) 57, temperature 97.8 F (36.6 C), temperature source Oral, resp. rate 18, height 6\' 4"  (1.93 m), weight 101.2 kg, SpO2 (!) 75 %. Body mass index is 27.14 kg/m.   Treatment Plan Summary: Medication management and Plan no change to medication management.  Encourage more group attendance and interaction with staff.  We are working on placement issues.  Alethia Berthold, MD 03/30/2022, 10:56 AM

## 2022-03-30 NOTE — Progress Notes (Signed)
Patient was noted to put male patients name and room number on his menu. Patient has also been noted to stare at a male peer at times and need frequent redirection. Male peer room is within line of sight of nurses station. Will con't to monitor. MHT and staff made aware to continue to monitor patients behaviors.

## 2022-03-30 NOTE — Group Note (Signed)
[facility] LCSW Group Therapy Note   Group Date: 03/30/2022 Start Time: 1300 End Time: 1400  Type of Therapy/Topic:  Group Therapy:  Feelings about Diagnosis  Participation Level:  Active    Description of Group:    This group will allow patients to explore their thoughts and feelings about diagnoses they have received. Patients will be guided to explore their level of understanding and acceptance of these diagnoses. Facilitator will encourage patients to process their thoughts and feelings about the reactions of others to their diagnosis, and will guide patients in identifying ways to discuss their diagnosis with significant others in their lives. This group will be process-oriented, with patients participating in exploration of their own experiences as well as giving and receiving support and challenge from other group members.   Therapeutic Goals: 1. Patient will demonstrate understanding of diagnosis as evidence by identifying two or more symptoms of the disorder:  2. Patient will be able to express two feelings regarding the diagnosis 3. Patient will demonstrate ability to communicate their needs through discussion and/or role plays  Summary of Patient Progress: Patient was present for the majority of the group although he was in and out throughout. He asked to share a reading from the bible that he feels describes how he feels. Pt endorses feelings of loneliness and expressed that everyone including Jesus needs friends at times. His comments were often random and off topic.    Therapeutic Modalities:   Cognitive Behavioral Therapy Brief Therapy Feelings Identification    Shirl Harris, LCSW

## 2022-03-30 NOTE — Progress Notes (Signed)
   03/30/22 1500  Clinical Encounter Type  Visited With Patient  Visit Type Initial  Referral From Nurse  Consult/Referral To Chaplain   Chaplain responded to nurse consult. Chaplain provided a compassionate presence and reflective listening as patient spoke about health challenges. Patient seemed sad and requested a Bible and prayer. Chaplain provided space for patient to express spiritual challenges and frustrations. Patient is knowledgeable about the Bible and believes in God. Patient appreciated Chaplain visit and would like a follow up visit.

## 2022-03-30 NOTE — Plan of Care (Signed)
  Problem: Safety: Goal: Ability to remain free from injury will improve Outcome: Progressing   

## 2022-03-30 NOTE — Progress Notes (Signed)
Patient presents with depressed mood, affect congruent. Lem states he is '' doing okay, but I didn't really sleep well lastnight. '' Patient states '' but overall I'm okay. I want a bible, can I speak with a chaplain'' Patient denies any SI or HI or A/V Hallucinations. Patient completed his self inventory and rates his depression at 9/10 on scale with 10 being worst, and 0 being none. He rates his anxiety at a 10/10 on scale with 10 being worst but on approach states he is overall '' ok''  Patient is ambulatory on the unit, appears calm. Eating well and no acute concerns with physical complaints voices '' cerebal palsy pain from chronic condition. ''  Patient compliant with am medications. Able to make his needs known and no further voiced concerns. Above discussed in treatment team. Order placed for chaplain per pt request.  Pt is safe, will con't to monitor.

## 2022-03-31 DIAGNOSIS — F203 Undifferentiated schizophrenia: Secondary | ICD-10-CM | POA: Diagnosis not present

## 2022-03-31 NOTE — Plan of Care (Signed)
This morning patient appears little irritable and refused to answer any questions. Patient visible in the milieu and watching TV in the day room. No aggressive behaviors noted. Refused medications at first but took later. Minimal interactions with staff & peers. Appetite and energy level good. Support and encouragement given.

## 2022-03-31 NOTE — Progress Notes (Signed)
   03/31/22 1800  Clinical Encounter Type  Visited With Patient  Visit Type Follow-up  Referral From Nurse  Consult/Referral To Chaplain   Chaplain followed up with patient. Patient seemed sad and withdrawn. Chaplain provided a compassionate presence and reflective listening as patient spoke about daily life challenges. Patient shared a poem with Chaplain. Chaplain explored ways to cope with frustrations and disappointments. Patient stated "I have no friends and am all alone". Chaplain services are available for continued care if desired.

## 2022-03-31 NOTE — Plan of Care (Signed)
  Problem: Coping: Goal: Level of anxiety will decrease Outcome: Progressing   Problem: Safety: Goal: Ability to remain free from injury will improve Outcome: Progressing   Problem: Education: Goal: Mental status will improve Outcome: Progressing Goal: Verbalization of understanding the information provided will improve Outcome: Progressing

## 2022-03-31 NOTE — BHH Counselor (Signed)
CSW received call from guardian, Derrick Atkins ([phone]). She inquired regarding pt code and received this. She shared that pt's pastor is interested in visiting and asked if this would be ok. CSW explained that she, as the guardian, has to approve visitors. She voiced understanding. CSW informed her of the visitation hours. CSW inquired if she consented to CSW being able to speak with [facility] as they are looking to transfer pt to assisted living. She agreed, giving verbal consent for CSW to speak with [facility] and ALF. No other concerns expressed. Contact ended without incident.   Derrick Atkins. Guerry Bruin, MSW, [place], [place] 03/31/2022 3:55 PM

## 2022-03-31 NOTE — Progress Notes (Signed)
[facility] MD Progress Note  03/31/2022 2:27 PM Derrick Atkins  MRN:  [id number] Subjective: Patient seen and chart reviewed.  Patient had a reasonably good day yesterday.  This morning he was grumpy and dysphoric and complaining but by the time I saw him he had relaxed.  He attended some groups and has not been acting out.  Denies current hallucinations. Principal Problem: Schizophrenia (HCC) Diagnosis: Principal Problem:   Schizophrenia (HCC) Active Problems:   Type 2 diabetes mellitus without complications (HCC)  Total Time spent with patient: 30 minutes  Past Psychiatric History: Past history of schizophrenia  Past Medical History:  Past Medical History:  Diagnosis Date   Cerebral palsy (HCC)    Depression    Diabetes mellitus without complication (HCC)    Hypertension    Kidney stones    Schizoaffective disorder (HCC)    Scoliosis     Past Surgical History:  Procedure Laterality Date   BOWEL RESECTION     LAPAROTOMY N/A 05/15/2020   Procedure: EXPLORATORY LAPAROTOMY;  Surgeon: Henrene Dodge, MD;  Location: [facility] ORS;  Service: General;  Laterality: N/A;   Family History:  Family History  Problem Relation Age of Onset   Heart disease Father    Heart attack Father    Family Psychiatric  History: See previous Social History:  Social History   Substance and Sexual Activity  Alcohol Use No     Social History   Substance and Sexual Activity  Drug Use No    Social History   Socioeconomic History   Marital status: Single    Spouse name: Not on file   Number of children: Not on file   Years of education: Not on file   Highest education level: Not on file  Occupational History   Not on file  Tobacco Use   Smoking status: Never   Smokeless tobacco: Never  Vaping Use   Vaping Use: Never used  Substance and Sexual Activity   Alcohol use: No   Drug use: No   Sexual activity: Not Currently  Other Topics Concern   Not on file  Social History Narrative   Not on  file   Social Determinants of Health   Financial Resource Strain: Not on file  Food Insecurity: Food Insecurity Present (03/23/2022)   Hunger Vital Sign    Worried About Running Out of Food in the Last Year: Often true    Ran Out of Food in the Last Year: Often true  Transportation Needs: Unmet Transportation Needs (03/23/2022)   PRAPARE - Administrator, Civil Service (Medical): Yes    Lack of Transportation (Non-Medical): Yes  Physical Activity: Not on file  Stress: Not on file  Social Connections: Not on file   Additional Social History:                         Sleep: Fair  Appetite:  Fair  Current Medications: Current Facility-Administered Medications  Medication Dose Route Frequency Provider Last Rate Last Admin   acetaminophen (TYLENOL) tablet 650 mg  650 mg Oral Q6H PRN Gabriel Cirri F, NP   650 mg at 03/31/22 0530   alum & mag hydroxide-simeth (MAALOX/MYLANTA) 200-200-20 MG/5ML suspension 30 mL  30 mL Oral Q4H PRN Gabriel Cirri F, NP       ARIPiprazole ER (ABILIFY MAINTENA) injection 400 mg  400 mg Intramuscular Q28 days Beatriz Settles, [name], MD   400 mg at 03/25/22 1516   aspirin  EC tablet 81 mg  81 mg Oral Daily [name], NP   81 mg at 03/31/22 0813   benzocaine (ORAJEL) 10 % mucosal gel   Mouth/Throat TID PRN Kaicen Desena, [name], MD   Given at 03/28/22 2221   benztropine (COGENTIN) tablet 1 mg  1 mg Oral Daily Gabriel Cirri F, NP   1 mg at 03/31/22 0744   divalproex (DEPAKOTE) DR tablet 500 mg  500 mg Oral Q12H Maddyson Keil T, MD   500 mg at 03/31/22 0744   fenofibrate tablet 54 mg  54 mg Oral Daily Gabriel Cirri F, NP   54 mg at 03/31/22 0745   gemfibrozil (LOPID) tablet 600 mg  600 mg Oral BID Gabriel Cirri F, NP   600 mg at 03/31/22 0745   levothyroxine (SYNTHROID) tablet 88 mcg  88 mcg Oral Q0600 [name], NP   88 mcg at 03/31/22 0743   lisinopril (ZESTRIL) tablet 10 mg  10 mg Oral Daily Gabriel Cirri F, NP   10 mg  at 03/31/22 0744   magnesium hydroxide (MILK OF MAGNESIA) suspension 30 mL  30 mL Oral Daily PRN Gabriel Cirri F, NP       metFORMIN (GLUCOPHAGE) tablet 1,000 mg  1,000 mg Oral BID WC Gabriel Cirri F, NP   1,000 mg at 03/31/22 0744   OLANZapine (ZYPREXA) tablet 10 mg  10 mg Oral QHS Alaiyah Bollman T, MD   10 mg at 03/30/22 2059   pioglitazone (ACTOS) tablet 45 mg  45 mg Oral Daily Gabriel Cirri F, NP   45 mg at 03/31/22 0744   pneumococcal 20-valent conjugate vaccine (PREVNAR 20) injection 0.5 mL  0.5 mL Intramuscular Tomorrow-1000 Afsana Liera T, MD       simvastatin (ZOCOR) tablet 20 mg  20 mg Oral QHS Gabriel Cirri F, NP   20 mg at 03/30/22 2059   temazepam (RESTORIL) capsule 15 mg  15 mg Oral QHS Gabriel Cirri F, NP   15 mg at 03/30/22 2059    Lab Results:  Results for orders placed or performed during the hospital encounter of 03/23/22 (from the past 48 hour(s))  Valproic acid level     Status: None   Collection Time: 03/30/22  7:16 AM  Result Value Ref Range   Valproic Acid Lvl 71 50.0 - 100.0 ug/mL    Comment: Performed at [facility], [address] [place], [place] [address]    Blood Alcohol level:  Lab Results  Component Value Date   [facility] <10 03/15/2022   ETH <10 03/12/2022    Metabolic Disorder Labs: Lab Results  Component Value Date   HGBA1C 6.0 (H) 03/25/2022   MPG 125.5 03/25/2022   MPG 154.2 02/08/2022   No results found for: "PROLACTIN" Lab Results  Component Value Date   CHOL 95 03/25/2022   TRIG 52 03/25/2022   HDL 42 03/25/2022   CHOLHDL 2.3 03/25/2022   VLDL 10 03/25/2022   LDLCALC 43 03/25/2022   LDLCALC 42 02/08/2022    Physical Findings: AIMS:  , ,  ,  ,    CIWA:    COWS:     Musculoskeletal: Strength & Muscle Tone: within normal limits Gait & Station: normal Patient leans: N/A  Psychiatric Specialty Exam:  Presentation  General Appearance: Appropriate for Environment  Eye Contact:Fair  Speech:Clear and  Coherent  Speech Volume:Normal  Handedness:Right   Mood and Affect  Mood:Euthymic  Affect:Congruent   Thought Process  Thought Processes:Coherent  Descriptions  of Associations:Intact  Orientation:Full (Time, Place and Person)  Thought Content:Logical  History of Schizophrenia/Schizoaffective disorder:Yes  Duration of Psychotic Symptoms:Greater than six months  Hallucinations:No data recorded Ideas of Reference:None  Suicidal Thoughts:No data recorded Homicidal Thoughts:No data recorded  Sensorium  Memory:Immediate Fair; Remote Fair  Judgment:Fair  Insight:Present   Executive Functions  Concentration:Fair  Attention Span:Fair  [place]   Psychomotor Activity  Psychomotor Activity:No data recorded  Assets  Assets:Communication Skills; Financial Resources/Insurance; Housing; Social Support   Sleep  Sleep:No data recorded   Physical Exam: Physical Exam Vitals reviewed.  Constitutional:      Appearance: Normal appearance.  HENT:     Head: Normocephalic and atraumatic.     Mouth/Throat:     Pharynx: Oropharynx is clear.  Eyes:     Pupils: Pupils are equal, round, and reactive to light.  Cardiovascular:     Rate and Rhythm: Normal rate and regular rhythm.  Pulmonary:     Effort: Pulmonary effort is normal.     Breath sounds: Normal breath sounds.  Abdominal:     General: Abdomen is flat.     Palpations: Abdomen is soft.  Musculoskeletal:        General: Normal range of motion.  Skin:    General: Skin is warm and dry.  Neurological:     General: No focal deficit present.     Mental Status: He is alert. Mental status is at baseline.  Psychiatric:        Attention and Perception: Attention normal.        Mood and Affect: Mood normal.        Speech: Speech normal.        Behavior: Behavior is cooperative.        Thought Content: Thought content normal.        Cognition and Memory: Cognition  is impaired.    Review of Systems  Constitutional: Negative.   HENT: Negative.    Eyes: Negative.   Respiratory: Negative.    Cardiovascular: Negative.   Gastrointestinal: Negative.   Musculoskeletal: Negative.   Skin: Negative.   Neurological: Negative.   Psychiatric/Behavioral: Negative.     Blood pressure 135/67, pulse 96, temperature 98.7 F (37.1 C), temperature source Oral, resp. rate 18, height 6\' 4"  (1.93 m), weight 101.2 kg, SpO2 100 %. Body mass index is 27.14 kg/m.   Treatment Plan Summary: Medication management and Plan no change to medication management.  Working on group home placement.  Alethia Berthold, MD 03/31/2022, 2:27 PM

## 2022-03-31 NOTE — Group Note (Signed)
[place] LCSW Group Therapy Note   Group Date: 03/31/2022 Start Time: 1300 End Time: 1400   Type of Therapy/Topic:  Group Therapy:  Emotion Regulation  Participation Level:  Minimal    Description of Group:    The purpose of this group is to assist patients in learning to regulate negative emotions and experience positive emotions. Patients will be guided to discuss ways in which they have been vulnerable to their negative emotions. These vulnerabilities will be juxtaposed with experiences of positive emotions or situations, and patients challenged to use positive emotions to combat negative ones. Special emphasis will be placed on coping with negative emotions in conflict situations, and patients will process healthy conflict resolution skills.  Therapeutic Goals: Patient will identify two positive emotions or experiences to reflect on in order to balance out negative emotions:  Patient will label two or more emotions that they find the most difficult to experience:  Patient will be able to demonstrate positive conflict resolution skills through discussion or role plays:   Summary of Patient Progress: Patient was present for the first half of the class. His comments while in class were not on topic. He appeared easily agitated as evidenced by getting upset with another pt who interrupted him during discussion. Pt had to be reminded that he was ok.    Therapeutic Modalities:   Cognitive Behavioral Therapy Feelings Identification Dialectical Behavioral Therapy   Shirl Harris, LCSW

## 2022-03-31 NOTE — Progress Notes (Signed)
Patient cooperative this shift. Visible in milieu. Interacting appropriately with others. Complaints of tooth pain this am. Prn given. Slept well, no other complaints voiced. Denies SI, HI, AVh.  Encouragement and support provided. Safety checks maintained. Medications given as prescribed. Pt remains safe on unit with q 15 min checks

## 2022-03-31 NOTE — Progress Notes (Signed)
Patient presents with a better affect than when this writer had him previously this admission. Pt denies SI/HI/AVH. Pt observed interacting appropriately with staff and peers on the unit. Pt compliant with medication administration per MD orders. Pt given education, support, and encouragement to be active in his treatment plan. Pt being monitored Q 15 minutes for safety per unit protocol, remains safe on the unit

## 2022-04-01 DIAGNOSIS — F203 Undifferentiated schizophrenia: Secondary | ICD-10-CM | POA: Diagnosis not present

## 2022-04-01 MED ORDER — HYDROXYZINE HCL 50 MG PO TABS
50.0000 mg | ORAL_TABLET | ORAL | Status: DC | PRN
  Administered 2022-04-01 – 2022-04-07 (×6): 50 mg via ORAL
  Filled 2022-04-01 (×7): qty 1

## 2022-04-01 MED ORDER — DIVALPROEX SODIUM 500 MG PO DR TAB
750.0000 mg | DELAYED_RELEASE_TABLET | Freq: Two times a day (BID) | ORAL | Status: DC
  Administered 2022-04-01 – 2022-04-09 (×16): 750 mg via ORAL
  Filled 2022-04-01 (×16): qty 1

## 2022-04-01 MED ORDER — OLANZAPINE 5 MG PO TABS
15.0000 mg | ORAL_TABLET | Freq: Every day | ORAL | Status: DC
  Administered 2022-04-01: 15 mg via ORAL
  Filled 2022-04-01: qty 1

## 2022-04-01 NOTE — BHH Group Notes (Signed)
[facility] Group Notes:  (Nursing/MHT/Case Management/Adjunct)  Date:  04/01/2022  Time:  9:56 AM  Type of Therapy:   Community Meeting  Participation Level:  Did Not Attend  Adela Lank [facility] 04/01/2022, 9:56 AM

## 2022-04-01 NOTE — Progress Notes (Signed)
Patient came into the medication room, after this writer asked him to come get evening medication. Patient walked into the medication room and said "Hailey's my nurse". This Probation officer explained to patient that I am his nurse. Once this Probation officer said this, patient stated "I'm not taking medicine", and walked off to the dayroom.

## 2022-04-01 NOTE — Progress Notes (Signed)
[facility] MD Progress Note  04/01/2022 10:33 AM Derrick Atkins  MRN:  [id number] Subjective: Patient continues to complain of anxiety.  He found a tick supposedly on himself either this morning or last night and is anxious about it even though there is no particular lesion.  Still tends to get irritable at times Principal Problem: Schizophrenia ([place]) Diagnosis: Principal Problem:   Schizophrenia ([place]) Active Problems:   Type 2 diabetes mellitus without complications ([place])  Total Time spent with patient: 30 minutes  Past Psychiatric History: Past history of bipolar disorder and schizophrenia  Past Medical History:  Past Medical History:  Diagnosis Date   Cerebral palsy ([place])    Depression    Diabetes mellitus without complication ([place])    Hypertension    Kidney stones    Schizoaffective disorder ([place])    Scoliosis     Past Surgical History:  Procedure Laterality Date   BOWEL RESECTION     LAPAROTOMY N/A 05/15/2020   Procedure: EXPLORATORY LAPAROTOMY;  Surgeon: Olean Ree, MD;  Location: [facility] ORS;  Service: General;  Laterality: N/A;   Family History:  Family History  Problem Relation Age of Onset   Heart disease Father    Heart attack Father    Family Psychiatric  History: See previous Social History:  Social History   Substance and Sexual Activity  Alcohol Use No     Social History   Substance and Sexual Activity  Drug Use No    Social History   Socioeconomic History   Marital status: Single    Spouse name: Not on file   Number of children: Not on file   Years of education: Not on file   Highest education level: Not on file  Occupational History   Not on file  Tobacco Use   Smoking status: Never   Smokeless tobacco: Never  Vaping Use   Vaping Use: Never used  Substance and Sexual Activity   Alcohol use: No   Drug use: No   Sexual activity: Not Currently  Other Topics Concern   Not on file  Social History Narrative   Not on file   Social  Determinants of Health   Financial Resource Strain: Not on file  Food Insecurity: Food Insecurity Present (03/23/2022)   Hunger Vital Sign    Worried About Running Out of Food in the Last Year: Often true    Ran Out of Food in the Last Year: Often true  Transportation Needs: Unmet Transportation Needs (03/23/2022)   PRAPARE - Hydrologist (Medical): Yes    Lack of Transportation (Non-Medical): Yes  Physical Activity: Not on file  Stress: Not on file  Social Connections: Not on file   Additional Social History:                         Sleep: Fair  Appetite:  Fair  Current Medications: Current Facility-Administered Medications  Medication Dose Route Frequency Provider Last Rate Last Admin   acetaminophen (TYLENOL) tablet 650 mg  650 mg Oral Q6H PRN Waldon Merl F, NP   650 mg at 04/01/22 0419   alum & mag hydroxide-simeth (MAALOX/MYLANTA) 200-200-20 MG/5ML suspension 30 mL  30 mL Oral Q4H PRN Waldon Merl F, NP       ARIPiprazole ER (ABILIFY MAINTENA) injection 400 mg  400 mg Intramuscular Q28 days Lititia Sen, [name], MD   400 mg at 03/25/22 1516   aspirin EC tablet 81 mg  81 mg Oral Daily [name], NP   81 mg at 04/01/22 0945   benzocaine (ORAJEL) 10 % mucosal gel   Mouth/Throat TID PRN Kanon Colunga, [name], MD   Given at 03/28/22 2221   benztropine (COGENTIN) tablet 1 mg  1 mg Oral Daily Waldon Merl F, NP   1 mg at 04/01/22 [id number]   divalproex (DEPAKOTE) DR tablet 750 mg  750 mg Oral Q12H Dorse Locy T, MD       fenofibrate tablet 54 mg  54 mg Oral Daily Waldon Merl F, NP   54 mg at 04/01/22 [id number]   gemfibrozil (LOPID) tablet 600 mg  600 mg Oral BID Waldon Merl F, NP   600 mg at 04/01/22 [id number]   hydrOXYzine (ATARAX) tablet 50 mg  50 mg Oral Q4H PRN Arlena Marsan, [name], MD   50 mg at 04/01/22 0945   levothyroxine (SYNTHROID) tablet 88 mcg  88 mcg Oral Q0600 [name], NP   88 mcg at 03/31/22 0743   lisinopril (ZESTRIL)  tablet 10 mg  10 mg Oral Daily Waldon Merl F, NP   10 mg at 04/01/22 [id number]   magnesium hydroxide (MILK OF MAGNESIA) suspension 30 mL  30 mL Oral Daily PRN Waldon Merl F, NP   30 mL at 04/01/22 0811   metFORMIN (GLUCOPHAGE) tablet 1,000 mg  1,000 mg Oral BID WC Waldon Merl F, NP   1,000 mg at 04/01/22 0812   OLANZapine (ZYPREXA) tablet 15 mg  15 mg Oral QHS Andray Assefa T, MD       pioglitazone (ACTOS) tablet 45 mg  45 mg Oral Daily Waldon Merl F, NP   45 mg at 04/01/22 [id number]   pneumococcal 20-valent conjugate vaccine (PREVNAR 20) injection 0.5 mL  0.5 mL Intramuscular Tomorrow-1000 Lanecia Sliva T, MD       simvastatin (ZOCOR) tablet 20 mg  20 mg Oral QHS Waldon Merl F, NP   20 mg at 03/31/22 2116   temazepam (RESTORIL) capsule 15 mg  15 mg Oral QHS [name], NP   15 mg at 03/31/22 2116    Lab Results: No results found for this or any previous visit (from the past 48 hour(s)).  Blood Alcohol level:  Lab Results  Component Value Date   ETH <10 03/15/2022   ETH <10 [id number]    Metabolic Disorder Labs: Lab Results  Component Value Date   HGBA1C 6.0 (H) 03/25/2022   MPG 125.5 03/25/2022   MPG 154.2 02/08/2022   No results found for: "PROLACTIN" Lab Results  Component Value Date   CHOL 95 03/25/2022   TRIG 52 03/25/2022   HDL 42 03/25/2022   CHOLHDL 2.3 03/25/2022   VLDL 10 03/25/2022   LDLCALC 43 03/25/2022   LDLCALC 42 02/08/2022    Physical Findings: AIMS:  , ,  ,  ,    CIWA:    COWS:     Musculoskeletal: Strength & Muscle Tone: within normal limits Gait & Station: normal Patient leans: N/A  Psychiatric Specialty Exam:  Presentation  General Appearance: Appropriate for Environment  Eye Contact:Fair  Speech:Clear and Coherent  Speech Volume:Normal  Handedness:Right   Mood and Affect  Mood:Euthymic  Affect:Congruent   Thought Process  Thought Processes:Coherent  Descriptions of  Associations:Intact  Orientation:Full (Time, Place and Person)  Thought Content:Logical  History of Schizophrenia/Schizoaffective disorder:Yes  Duration of Psychotic Symptoms:Greater than six months  Hallucinations:No data recorded Ideas of Reference:None  Suicidal Thoughts:No data recorded Homicidal Thoughts:No data  recorded  Sensorium  Memory:Immediate Fair; Remote Derrick Atkins   Executive Functions  Concentration:Fair  Attention Span:Fair  [place]   Psychomotor Activity  Psychomotor Activity:No data recorded  Assets  Assets:Communication Skills; Financial Resources/Insurance; Housing; Social Support   Sleep  Sleep:No data recorded   Physical Exam: Physical Exam Vitals and nursing note reviewed.  Constitutional:      Appearance: Normal appearance.  HENT:     Head: Normocephalic and atraumatic.     Mouth/Throat:     Pharynx: Oropharynx is clear.  Eyes:     Pupils: Pupils are equal, round, and reactive to light.  Cardiovascular:     Rate and Rhythm: Normal rate and regular rhythm.  Pulmonary:     Effort: Pulmonary effort is normal.     Breath sounds: Normal breath sounds.  Abdominal:     General: Abdomen is flat.     Palpations: Abdomen is soft.  Musculoskeletal:        General: Normal range of motion.  Skin:    General: Skin is warm and dry.  Neurological:     General: No focal deficit present.     Mental Status: He is alert. Mental status is at baseline.  Psychiatric:        Mood and Affect: Mood normal.        Thought Content: Thought content normal.    Review of Systems  Constitutional: Negative.   HENT: Negative.    Eyes: Negative.   Respiratory: Negative.    Cardiovascular: Negative.   Gastrointestinal: Negative.   Musculoskeletal: Negative.   Skin: Negative.   Neurological: Negative.   Psychiatric/Behavioral:  The patient is nervous/anxious.    Blood  pressure (!) 144/78, pulse (!) 111, temperature 97.7 F (36.5 C), temperature source Oral, resp. rate 18, height 6\' 4"  (1.93 m), weight 101.2 kg, SpO2 100 %. Body mass index is 27.14 kg/m.   Treatment Plan Summary: Medication management and Plan increase dose of olanzapine to 15 mg at night and Depakote to 750 mg twice a day at patient's request.  Continue the rest of medications at this time.  Encourage group attendance.  Outpatient team and social work are working on finding assisted living facility  Alethia Berthold, MD 04/01/2022, 10:33 AM

## 2022-04-01 NOTE — Group Note (Signed)
[facility] LCSW Group Therapy Note   Group Date: 04/01/2022 Start Time: 1300 End Time: 1400   Type of Therapy/Topic:  Group Therapy:  Balance in Life  Participation Level:  Minimal   Description of Group:    This group will address the concept of balance and how it feels and looks when one is unbalanced. Patients will be encouraged to process areas in their lives that are out of balance, and identify reasons for remaining unbalanced. Facilitators will guide patients utilizing problem- solving interventions to address and correct the stressor making their life unbalanced. Understanding and applying boundaries will be explored and addressed for obtaining  and maintaining a balanced life. Patients will be encouraged to explore ways to assertively make their unbalanced needs known to significant others in their lives, using other group members and facilitator for support and feedback.  Therapeutic Goals: Patient will identify two or more emotions or situations they have that consume much of in their lives. Patient will identify signs/triggers that life has become out of balance:  Patient will identify two ways to set boundaries in order to achieve balance in their lives:  Patient will demonstrate ability to communicate their needs through discussion and/or role plays  Summary of Patient Progress: Patient was in and out of group throughout the time in the room. He participated in the [place]. However, his comments throughout the discussion were off topic and did not appear to connect with the discussion.    Therapeutic Modalities:   Cognitive Behavioral Therapy Solution-Focused Therapy Assertiveness Training   Shirl Harris, LCSW

## 2022-04-01 NOTE — Plan of Care (Signed)
D- Patient alert and oriented. Patient presented in a preoccupied, but pleasant mood on assessment stating that he slept "I don't know, alright" last night and had complaints of constipation, in which he requested PRN MOM from this writer. Patient denied anxiety, but endorsed depression, rating it a "3,000" on a scale of 1-10. When this writer asked patient what's making him feel this way, he stated "I don't know". Patient reported that he hears voices, but could not tell what they are saying. Patient also stated that he needs his medication increased because it's not helping. Patient denied SI, HI, VH, and pain at the time of assessment. Patient had no stated goals for today.  A- Scheduled medications administered to patient, per MD orders. Support and encouragement provided.  Routine safety checks conducted every 15 minutes.  Patient informed to notify staff with problems or concerns.  R- No adverse drug reactions noted. Patient contracts for safety at this time. Patient compliant with some medications and treatment plan. Patient remains safe at this time.  Problem: Education: Goal: Knowledge of General Education information will improve Description: Including pain rating scale, medication(s)/side effects and non-pharmacologic comfort measures Outcome: Not Progressing   Problem: Health Behavior/Discharge Planning: Goal: Ability to manage health-related needs will improve Outcome: Not Progressing   Problem: Clinical Measurements: Goal: Ability to maintain clinical measurements within normal limits will improve Outcome: Not Progressing Goal: Will remain free from infection Outcome: Not Progressing Goal: Diagnostic test results will improve Outcome: Not Progressing Goal: Respiratory complications will improve Outcome: Not Progressing Goal: Cardiovascular complication will be avoided Outcome: Not Progressing   Problem: Activity: Goal: Risk for activity intolerance will decrease Outcome:  Not Progressing   Problem: Nutrition: Goal: Adequate nutrition will be maintained Outcome: Not Progressing   Problem: Coping: Goal: Level of anxiety will decrease Outcome: Not Progressing   Problem: Elimination: Goal: Will not experience complications related to bowel motility Outcome: Not Progressing Goal: Will not experience complications related to urinary retention Outcome: Not Progressing   Problem: Pain Managment: Goal: General experience of comfort will improve Outcome: Not Progressing   Problem: Safety: Goal: Ability to remain free from injury will improve Outcome: Not Progressing   Problem: Skin Integrity: Goal: Risk for impaired skin integrity will decrease Outcome: Not Progressing   Problem: Education: Goal: Knowledge of [facility] Education information/materials will improve Outcome: Not Progressing Goal: Emotional status will improve Outcome: Not Progressing Goal: Mental status will improve Outcome: Not Progressing Goal: Verbalization of understanding the information provided will improve Outcome: Not Progressing   Problem: Health Behavior/Discharge Planning: Goal: Compliance with treatment plan for underlying cause of condition will improve Outcome: Not Progressing   Problem: Safety: Goal: Periods of time without injury will increase Outcome: Not Progressing   Problem: Coping: Goal: Coping ability will improve Outcome: Not Progressing   Problem: Self-Concept: Goal: Ability to disclose and discuss suicidal ideas will improve Outcome: Not Progressing Goal: Will verbalize positive feelings about self Outcome: Not Progressing   Problem: Self-Concept: Goal: Ability to identify factors that promote anxiety will improve Outcome: Not Progressing Goal: Level of anxiety will decrease Outcome: Not Progressing Goal: Ability to modify response to factors that promote anxiety will improve Outcome: Not Progressing

## 2022-04-01 NOTE — Progress Notes (Signed)
Recreation Therapy Notes  Date: 04/01/2022   Time: 10:40 am    Location: Craft room      Behavioral response: Not Engaged   Intervention Topic: Happiness      Discussion/Intervention:  Group content today was focused on Happiness. The group defined happiness and described where happiness comes from. Individuals identified what makes them happy and how they go about making others happy. Patients expressed things that stop them from being happy and ways they can improve their happiness. The group stated reasons why it is important to be happy. The group participated in the intervention "My Happiness", where they had a chance to identify and express things that make them happy. Clinical Observations/Feedback: Patient came to group and was not engaged in the group discussion.Participant left group early and did not return.    Ryden Wainer LRT/CTRS             Dawnette Mione 04/01/2022 12:30 PM

## 2022-04-01 NOTE — Progress Notes (Signed)
Patient irritable when this writer got on the unit. Patient was redirectable. Pt denies SI/HI/AVH, endorses anxiety. Pt observed interacting appropriately with staff and peers on the unit. Pt compliant with medication administration per MD orders. Pt given education, support, and encouragement to be active in his treatment plan. Pt being monitored Q 15 minutes for safety per unit protocol, remains safe on the unit

## 2022-04-01 NOTE — Progress Notes (Signed)
Patient came up to the nurses station and stated "I need something for anxiety, I'm gonna die". This Probation officer gave patient PRN Vistaril, in which he tolerated it well, without any issues. Patient remains safe on the unit, will continue to monitor.

## 2022-04-02 DIAGNOSIS — F203 Undifferentiated schizophrenia: Secondary | ICD-10-CM | POA: Diagnosis not present

## 2022-04-02 MED ORDER — OLANZAPINE 10 MG PO TABS
20.0000 mg | ORAL_TABLET | Freq: Every day | ORAL | Status: DC
  Administered 2022-04-02 – 2022-04-08 (×7): 20 mg via ORAL
  Filled 2022-04-02 (×7): qty 2

## 2022-04-02 MED ORDER — ZIPRASIDONE MESYLATE 20 MG IM SOLR
20.0000 mg | Freq: Once | INTRAMUSCULAR | Status: AC
  Administered 2022-04-02: 20 mg via INTRAMUSCULAR
  Filled 2022-04-02: qty 20

## 2022-04-02 MED ORDER — TRAZODONE HCL 50 MG PO TABS
150.0000 mg | ORAL_TABLET | Freq: Every day | ORAL | Status: DC
  Administered 2022-04-02 – 2022-04-08 (×7): 150 mg via ORAL
  Filled 2022-04-02 (×7): qty 1

## 2022-04-02 NOTE — Plan of Care (Signed)
D- Patient alert and oriented. Patient presented in a pleasant mood on assessment stating that he slept poorly last night, "very little". It was reported from night shift that patient had an eventful night last night, being agitated with staff. Patient endorsed both depression and anxiety again today, however, he did not go into detail as to why he's feeling this way. Patient continues to endorse AH, but can not voice to this writer what they are saying. Patient denied SI, HI, VH, and pain at this time. Patient had no stated goals for today.   A- Scheduled medications administered to patient, per MD orders. Support and encouragement provided.  Routine safety checks conducted every 15 minutes.  Patient informed to notify staff with problems or concerns.  R- No adverse drug reactions noted. Patient contracts for safety at this time. Patient compliant with medications and treatment plan. Patient receptive, calm, and cooperative. Patient remains safe at this time.  Problem: Education: Goal: Knowledge of General Education information will improve Description: Including pain rating scale, medication(s)/side effects and non-pharmacologic comfort measures Outcome: Not Progressing   Problem: Health Behavior/Discharge Planning: Goal: Ability to manage health-related needs will improve Outcome: Not Progressing   Problem: Clinical Measurements: Goal: Ability to maintain clinical measurements within normal limits will improve Outcome: Not Progressing Goal: Will remain free from infection Outcome: Not Progressing Goal: Diagnostic test results will improve Outcome: Not Progressing Goal: Respiratory complications will improve Outcome: Not Progressing Goal: Cardiovascular complication will be avoided Outcome: Not Progressing   Problem: Activity: Goal: Risk for activity intolerance will decrease Outcome: Not Progressing   Problem: Nutrition: Goal: Adequate nutrition will be maintained Outcome: Not  Progressing   Problem: Coping: Goal: Level of anxiety will decrease Outcome: Not Progressing   Problem: Elimination: Goal: Will not experience complications related to bowel motility Outcome: Not Progressing Goal: Will not experience complications related to urinary retention Outcome: Not Progressing   Problem: Pain Managment: Goal: General experience of comfort will improve Outcome: Not Progressing   Problem: Safety: Goal: Ability to remain free from injury will improve Outcome: Not Progressing   Problem: Skin Integrity: Goal: Risk for impaired skin integrity will decrease Outcome: Not Progressing   Problem: Education: Goal: Knowledge of [place] [facility] Education information/materials will improve Outcome: Not Progressing Goal: Emotional status will improve Outcome: Not Progressing Goal: Mental status will improve Outcome: Not Progressing Goal: Verbalization of understanding the information provided will improve Outcome: Not Progressing   Problem: Health Behavior/Discharge Planning: Goal: Compliance with treatment plan for underlying cause of condition will improve Outcome: Not Progressing   Problem: Safety: Goal: Periods of time without injury will increase Outcome: Not Progressing   Problem: Coping: Goal: Coping ability will improve Outcome: Not Progressing   Problem: Self-Concept: Goal: Ability to disclose and discuss suicidal ideas will improve Outcome: Not Progressing Goal: Will verbalize positive feelings about self Outcome: Not Progressing   Problem: Self-Concept: Goal: Ability to identify factors that promote anxiety will improve Outcome: Not Progressing Goal: Level of anxiety will decrease Outcome: Not Progressing Goal: Ability to modify response to factors that promote anxiety will improve Outcome: Not Progressing
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
## 2022-04-02 NOTE — Progress Notes (Signed)
Patient irritable, cussing out staff stating, "this is bullshit, nobody is helping me". Pt verbally deescalated by staff. Pt informed if he comes out of his room yelling and slamming doors again that he would be given PRN medication.   This Probation officer called the NP on-call and got PRN medications ready to go if needed

## 2022-04-02 NOTE — Progress Notes (Addendum)
Recreation Therapy Notes    Date: 04/02/2022   Time: 10:40 am     Location: Courtyard      Behavioral response: Appropriate   Intervention Topic: Leisure     Discussion/Intervention:  Group content today was focused on leisure. The group defined what leisure is and some positive leisure activities they participate in. Individuals identified the difference between good and bad leisure. Participants expressed how they feel after participating in the leisure of their choice. The group discussed how they go about picking a leisure activity and if others are involved in their leisure activities. The patient stated how many leisure activities they have to choose from and reasons why it is important to have leisure time. Individuals participated in the intervention "Exploration of Leisure" where they had a chance to identify new leisure activities as well as benefits of leisure. Clinical Observations/Feedback: Patient came to group and was able to explore and participate in many leisure activities. Participant was able to identify leisure activities they enjoy outside of the hospital. Individual was social with peers and staff while participating in the intervention.    Jaxan Michel LRT/CTRS               Danyela Posas 04/02/2022 11:47 AM

## 2022-04-02 NOTE — Progress Notes (Signed)
Patient presents with a better affect than when this writer had him previously this admission. Pt denies SI/HI/AVH. Pt observed interacting appropriately with staff and peers on the unit. Pt compliant with medication administration per MD orders. Pt given education, support, and encouragement to be active in his treatment plan. Pt being monitored Q 15 minutes for safety per unit protocol, remains safe on the unit 

## 2022-04-02 NOTE — Progress Notes (Signed)
Patient became agitated with staff doing rounds. Pt spit at staff, cussed [place] out. Pt sat down on the bed and was given PRN medication into his left arm. Pt was resistant at first but did let us give the medication per MD orders.
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
## 2022-04-02 NOTE — Group Note (Signed)
[facility] LCSW Group Therapy Note   Group Date: 04/02/2022 Start Time: 1300 End Time: 1400  Type of Therapy and Topic:  Group Therapy:  Feelings around Relapse and Recovery  Participation Level:  Minimal    Description of Group:    Patients in this group will discuss emotions they experience before and after a relapse. They will process how experiencing these feelings, or avoidance of experiencing them, relates to having a relapse. Facilitator will guide patients to explore emotions they have related to recovery. Patients will be encouraged to process which emotions are more powerful. They will be guided to discuss the emotional reaction significant others in their lives may have to patients' relapse or recovery. Patients will be assisted in exploring ways to respond to the emotions of others without this contributing to a relapse.  Therapeutic Goals: Patient will identify two or more emotions that lead to relapse for them:  Patient will identify two emotions that result when they relapse:  Patient will identify two emotions related to recovery:  Patient will demonstrate ability to communicate their needs through discussion and/or role plays.   Summary of Patient Progress: Patient was present for the beginning of class but left prematurely. He participated in the icebreaker and volunteered to read from worksheet. His behavior was appropriate for the group setting.   Therapeutic Modalities:   Cognitive Behavioral Therapy Solution-Focused Therapy Assertiveness Training Relapse Prevention Therapy   Shirl Harris, LCSW

## 2022-04-03 DIAGNOSIS — F203 Undifferentiated schizophrenia: Secondary | ICD-10-CM | POA: Diagnosis not present
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
## 2022-04-03 NOTE — Plan of Care (Signed)
Pt woke up twice in the middle of the night. Pt's needs were addressed and pt went back to bed. Pt denies experiencing any depression at this time. Pt endorses experiencing anxiety. Pt denies experiencing any SI/HI/AVH at this time. Pt provided support and encouragement. Pt is calm and cooperative. Pt is medication compliant. Pt is monitored q15 minutes for safety per unit policy. Plan of care is ongoing.   Problem: Pain Managment: Goal: General experience of comfort will improve Outcome: Progressing   Problem: Coping: Goal: Level of anxiety will decrease Outcome: Not Progressing
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
## 2022-04-03 NOTE — [facility] Group Notes (Signed)
Patients were given education on positive reframing and how positive mindset can impact mood. Patients were given a poem to read as well and discussed how anxiety can impact thinking. Pt attended but did not engage even when called upon.

## 2022-04-03 NOTE — [facility] Group Notes (Signed)
[facility] Group Notes:  (Nursing/MHT/Case Management/Adjunct)  Date:  04/03/2022  Time:  3:16 PM  Type of Therapy:  Group Therapy  Participation Level:  Minimal  Participation Quality:  Appropriate  Affect:  Angry, Defensive, and Irritable  Cognitive:  Alert  Insight:  Lacking  Engagement in Group:  Lacking  Modes of Intervention:  Discussion and Education  Summary of Progress/Problems: pt attended group with no interaction or participation. Pt is not approachable or redirectable without getting upset and/or angry response back  Maglione,Avner Stroder E 04/03/2022, 3:16 PM

## 2022-04-04 DIAGNOSIS — F203 Undifferentiated schizophrenia: Secondary | ICD-10-CM | POA: Diagnosis not present

## 2022-04-04 NOTE — Group Note (Signed)
[facility] LCSW Group Therapy Note   Group Date: 04/04/2022 Start Time: 1230 End Time: 1330  Type of Therapy and Topic:  Group Therapy:  Feelings around Relapse and Recovery  Participation Level:  Did Not Attend   Mood:  Description of Group:    Patients in this group will discuss emotions they experience before and after a relapse. They will process how experiencing these feelings, or avoidance of experiencing them, relates to having a relapse. Facilitator will guide patients to explore emotions they have related to recovery. Patients will be encouraged to process which emotions are more powerful. They will be guided to discuss the emotional reaction significant others in their lives may have to patients' relapse or recovery. Patients will be assisted in exploring ways to respond to the emotions of others without this contributing to a relapse.  Therapeutic Goals: Patient will identify two or more emotions that lead to relapse for them:  Patient will identify two emotions that result when they relapse:  Patient will identify two emotions related to recovery:  Patient will demonstrate ability to communicate their needs through discussion and/or role plays.   Summary of Patient Progress:  Patient did not attend group despite encouraged participation.    Therapeutic Modalities:   Cognitive Behavioral Therapy Solution-Focused Therapy Assertiveness Training Relapse Prevention Therapy   Kejon W Emila Steinhauser, LCSWA 

## 2022-04-04 NOTE — Progress Notes (Signed)
[facility] MD Progress Note  04/04/2022 11:55 AM Derrick Atkins  MRN:  [id number] Subjective: Patient seen and chart reviewed.  Patient has no new complaints today.  Mood stable.  Hallucinations minimal.  Behavior reasonably good today without any lashing out.  Tolerating medicine Principal Problem: Schizophrenia ([facility]) Diagnosis: Principal Problem:   Schizophrenia ([facility]) Active Problems:   Type 2 diabetes mellitus without complications ([facility])  Total Time spent with patient: 30 minutes  Past Psychiatric History: Past history of schizophrenia  Past Medical History:  Past Medical History:  Diagnosis Date   Cerebral palsy ([facility])    Depression    Diabetes mellitus without complication ([facility])    Hypertension    Kidney stones    Schizoaffective disorder ([facility])    Scoliosis     Past Surgical History:  Procedure Laterality Date   BOWEL RESECTION     LAPAROTOMY N/A 05/15/2020   Procedure: EXPLORATORY LAPAROTOMY;  Surgeon: Henrene Dodge, MD;  Location: [facility] ORS;  Service: General;  Laterality: N/A;   Family History:  Family History  Problem Relation Age of Onset   Heart disease Father    Heart attack Father    Family Psychiatric  History: See previous Social History:  Social History   Substance and Sexual Activity  Alcohol Use No     Social History   Substance and Sexual Activity  Drug Use No    Social History   Socioeconomic History   Marital status: Single    Spouse name: Not on file   Number of children: Not on file   Years of education: Not on file   Highest education level: Not on file  Occupational History   Not on file  Tobacco Use   Smoking status: Never   Smokeless tobacco: Never  Vaping Use   Vaping Use: Never used  Substance and Sexual Activity   Alcohol use: No   Drug use: No   Sexual activity: Not Currently  Other Topics Concern   Not on file  Social History Narrative   Not on file   Social Determinants of Health   Financial Resource Strain: Not on  file  Food Insecurity: Food Insecurity Present (03/23/2022)   Hunger Vital Sign    Worried About Running Out of Food in the Last Year: Often true    Ran Out of Food in the Last Year: Often true  Transportation Needs: Unmet Transportation Needs (03/23/2022)   PRAPARE - Administrator, Civil Service (Medical): Yes    Lack of Transportation (Non-Medical): Yes  Physical Activity: Not on file  Stress: Not on file  Social Connections: Not on file   Additional Social History:                         Sleep: Fair  Appetite:  Fair  Current Medications: Current Facility-Administered Medications  Medication Dose Route Frequency Provider Last Rate Last Admin   acetaminophen (TYLENOL) tablet 650 mg  650 mg Oral Q6H PRN Gabriel Cirri F, NP   650 mg at 04/04/22 0214   alum & mag hydroxide-simeth (MAALOX/MYLANTA) 200-200-20 MG/5ML suspension 30 mL  30 mL Oral Q4H PRN Gabriel Cirri F, NP   30 mL at 04/01/22 2321   ARIPiprazole ER (ABILIFY MAINTENA) injection 400 mg  400 mg Intramuscular Q28 days Gao Mitnick, [name], MD   400 mg at 03/25/22 1516   aspirin EC tablet 81 mg  81 mg Oral Daily [name], NP  81 mg at 04/04/22 1044   benzocaine (ORAJEL) 10 % mucosal gel   Mouth/Throat TID PRN Amani Marseille, [name], MD   Given at 04/04/22 0214   benztropine (COGENTIN) tablet 1 mg  1 mg Oral Daily Gabriel Cirri F, NP   1 mg at 04/04/22 0745   divalproex (DEPAKOTE) DR tablet 750 mg  750 mg Oral Q12H Lani Mendiola T, MD   750 mg at 04/04/22 0745   fenofibrate tablet 54 mg  54 mg Oral Daily Gabriel Cirri F, NP   54 mg at 04/04/22 0744   gemfibrozil (LOPID) tablet 600 mg  600 mg Oral BID Gabriel Cirri F, NP   600 mg at 04/04/22 0745   hydrOXYzine (ATARAX) tablet 50 mg  50 mg Oral Q4H PRN Neeta Storey, [name], MD   50 mg at 04/04/22 0214   levothyroxine (SYNTHROID) tablet 88 mcg  88 mcg Oral Q0600 [name], NP   88 mcg at 04/04/22 0530   lisinopril (ZESTRIL) tablet 10 mg  10  mg Oral Daily Gabriel Cirri F, NP   10 mg at 04/04/22 0744   magnesium hydroxide (MILK OF MAGNESIA) suspension 30 mL  30 mL Oral Daily PRN Gabriel Cirri F, NP   30 mL at 04/01/22 0811   metFORMIN (GLUCOPHAGE) tablet 1,000 mg  1,000 mg Oral BID WC Gabriel Cirri F, NP   1,000 mg at 04/04/22 0743   OLANZapine (ZYPREXA) tablet 20 mg  20 mg Oral QHS Remie Mathison T, MD   20 mg at 04/03/22 2118   pioglitazone (ACTOS) tablet 45 mg  45 mg Oral Daily Gabriel Cirri F, NP   45 mg at 04/04/22 0744   pneumococcal 20-valent conjugate vaccine (PREVNAR 20) injection 0.5 mL  0.5 mL Intramuscular Tomorrow-1000 Milianna Ericsson T, MD       simvastatin (ZOCOR) tablet 20 mg  20 mg Oral QHS Gabriel Cirri F, NP   20 mg at 04/03/22 2118   traZODone (DESYREL) tablet 150 mg  150 mg Oral QHS Coralyn Roselli, [name], MD   150 mg at 04/03/22 2118    Lab Results: No results found for this or any previous visit (from the past 48 hour(s)).  Blood Alcohol level:  Lab Results  Component Value Date   ETH <10 03/15/2022   ETH <10 03/12/2022    Metabolic Disorder Labs: Lab Results  Component Value Date   HGBA1C 6.0 (H) 03/25/2022   MPG 125.5 03/25/2022   MPG 154.2 02/08/2022   No results found for: "PROLACTIN" Lab Results  Component Value Date   CHOL 95 03/25/2022   TRIG 52 03/25/2022   HDL 42 03/25/2022   CHOLHDL 2.3 03/25/2022   VLDL 10 03/25/2022   LDLCALC 43 03/25/2022   LDLCALC 42 02/08/2022    Physical Findings: AIMS:  , ,  ,  ,    CIWA:    COWS:     Musculoskeletal: Strength & Muscle Tone: within normal limits Gait & Station: normal Patient leans: N/A  Psychiatric Specialty Exam:  Presentation  General Appearance: Appropriate for Environment  Eye Contact:Fair  Speech:Clear and Coherent  Speech Volume:Normal  Handedness:Right   Mood and Affect  Mood:Euthymic  Affect:Congruent   Thought Process  Thought Processes:Coherent  Descriptions of  Associations:Intact  Orientation:Full (Time, Place and Person)  Thought Content:Logical  History of Schizophrenia/Schizoaffective disorder:Yes  Duration of Psychotic Symptoms:Greater than six months  Hallucinations:No data recorded Ideas of Reference:None  Suicidal Thoughts:No data recorded Homicidal Thoughts:No data recorded  Sensorium  Memory:Immediate Fair; Remote Fair  Judgment:Fair  Insight:Present   Executive Functions  Concentration:Fair  Attention Span:Fair  [place]   Psychomotor Activity  Psychomotor Activity:No data recorded  Assets  Assets:Communication Skills; Financial Resources/Insurance; Housing; Social Support   Sleep  Sleep:No data recorded   Physical Exam: Physical Exam Vitals and nursing note reviewed.  Constitutional:      Appearance: Normal appearance.  HENT:     Head: Normocephalic and atraumatic.     Mouth/Throat:     Pharynx: Oropharynx is clear.  Eyes:     Pupils: Pupils are equal, round, and reactive to light.  Cardiovascular:     Rate and Rhythm: Normal rate and regular rhythm.  Pulmonary:     Effort: Pulmonary effort is normal.     Breath sounds: Normal breath sounds.  Abdominal:     General: Abdomen is flat.     Palpations: Abdomen is soft.  Musculoskeletal:        General: Normal range of motion.  Skin:    General: Skin is warm and dry.  Neurological:     General: No focal deficit present.     Mental Status: He is alert. Mental status is at baseline.  Psychiatric:        Attention and Perception: Attention normal.        Mood and Affect: Mood normal.        Speech: Speech normal.        Behavior: Behavior normal.        Thought Content: Thought content normal.        Cognition and Memory: Cognition normal.    Review of Systems  Constitutional: Negative.   HENT: Negative.    Eyes: Negative.   Respiratory: Negative.    Cardiovascular: Negative.    Gastrointestinal: Negative.   Musculoskeletal: Negative.   Skin: Negative.   Neurological: Negative.   Psychiatric/Behavioral: Negative.     Blood pressure (!) 140/83, pulse (!) 110, temperature 97.7 F (36.5 C), temperature source Oral, resp. rate 19, height 6\' 4"  (1.93 m), weight 101.2 kg, SpO2 100 %. Body mass index is 27.14 kg/m.   Treatment Plan Summary: Plan no change to medication.  Encourage patient to keep going to group activities stay out of bed sleep regularly at night.  We are hoping to have some word about living facility that would be appropriate this week.  Alethia Berthold, MD 04/04/2022, 11:55 AM

## 2022-04-04 NOTE — Plan of Care (Signed)
Met with pt in room. Pt is lethargic and slow to answer questions. Pt reports depression, denies SI / HI / AVH. Staff will continue to monitor for safety. Pt has been adherent with scheduled medications. Pt has attended groups, and been seen in the milieu during meal times.    Problem: Education: Goal: Knowledge of General Education information will improve Description: Including pain rating scale, medication(s)/side effects and non-pharmacologic comfort measures Outcome: Not Progressing   Problem: Health Behavior/Discharge Planning: Goal: Ability to manage health-related needs will improve Outcome: Not Progressing   Problem: Clinical Measurements: Goal: Ability to maintain clinical measurements within normal limits will improve Outcome: Not Progressing

## 2022-04-04 NOTE — [facility] Group Notes (Signed)
[facility] Group Notes:  (Nursing/MHT/Case Management/Adjunct)  Date:  04/04/2022  Time:  5:25 PM  Type of Therapy:  Group Therapy  Participation Level:  Minimal  Participation Quality:  Appropriate  Affect:  Angry, Anxious, Blunted, Defensive, Depressed, and Irritable  Cognitive:  Alert  Insight:  Lacking  Engagement in Group:  Lacking and Poor  Modes of Intervention:  Discussion and Education  Summary of Progress/Problems:  Maglione,Pleas Carneal E 04/04/2022, 5:25 PM

## 2022-04-05 DIAGNOSIS — F203 Undifferentiated schizophrenia: Secondary | ICD-10-CM | POA: Diagnosis not present

## 2022-04-05 NOTE — Progress Notes (Signed)
Recreation Therapy Notes  Date: 04/05/2022   Time: 10:00 am    Location: Craft room      Behavioral response: Appropriate   Intervention Topic: Relaxation       Discussion/Intervention:  Group content today was focused on relaxation. The group defined relaxation and identified healthy ways to relax. Individuals expressed how much time they spend relaxing. Patients expressed how much their life would be if they did not make time for themselves to relax. The group stated ways they could improve their relaxation techniques in the future.  Individuals participated in the intervention "Time to Relax" where they had a chance to experience different relaxation techniques.  Clinical Observations/Feedback: Patient came to group. Toree Edling LRT/CTRS             Armany Mano 04/05/2022 11:39 AM        Creedence Kunesh 04/05/2022 11:39 AM

## 2022-04-05 NOTE — Progress Notes (Signed)
[facility] MD Progress Note  04/05/2022 2:11 PM Derrick Atkins  MRN:  [id number] Subjective: Patient has no new complaints.  Mood stable.  No behavior problems.  Hoping that outpatient team will achieve some kind of living facility before long Principal Problem: Schizophrenia ([place]) Diagnosis: Principal Problem:   Schizophrenia ([place]) Active Problems:   Type 2 diabetes mellitus without complications ([place])  Total Time spent with patient: 20 minutes  Past Psychiatric History: Past history of bipolar or schizophrenia or schizoaffective disorder  Past Medical History:  Past Medical History:  Diagnosis Date   Cerebral palsy ([place])    Depression    Diabetes mellitus without complication ([place])    Hypertension    Kidney stones    Schizoaffective disorder ([place])    Scoliosis     Past Surgical History:  Procedure Laterality Date   BOWEL RESECTION     LAPAROTOMY N/A 05/15/2020   Procedure: EXPLORATORY LAPAROTOMY;  Surgeon: Olean Ree, MD;  Location: [facility] ORS;  Service: General;  Laterality: N/A;   Family History:  Family History  Problem Relation Age of Onset   Heart disease Father    Heart attack Father    Family Psychiatric  History: See previous Social History:  Social History   Substance and Sexual Activity  Alcohol Use No     Social History   Substance and Sexual Activity  Drug Use No    Social History   Socioeconomic History   Marital status: Single    Spouse name: Not on file   Number of children: Not on file   Years of education: Not on file   Highest education level: Not on file  Occupational History   Not on file  Tobacco Use   Smoking status: Never   Smokeless tobacco: Never  Vaping Use   Vaping Use: Never used  Substance and Sexual Activity   Alcohol use: No   Drug use: No   Sexual activity: Not Currently  Other Topics Concern   Not on file  Social History Narrative   Not on file   Social Determinants of Health   Financial Resource Strain: Not on  file  Food Insecurity: Food Insecurity Present (03/23/2022)   Hunger Vital Sign    Worried About Running Out of Food in the Last Year: Often true    Ran Out of Food in the Last Year: Often true  Transportation Needs: Unmet Transportation Needs (03/23/2022)   PRAPARE - Hydrologist (Medical): Yes    Lack of Transportation (Non-Medical): Yes  Physical Activity: Not on file  Stress: Not on file  Social Connections: Not on file   Additional Social History:                         Sleep: Fair  Appetite:  Fair  Current Medications: Current Facility-Administered Medications  Medication Dose Route Frequency Provider Last Rate Last Admin   acetaminophen (TYLENOL) tablet 650 mg  650 mg Oral Q6H PRN Waldon Merl F, NP   650 mg at 04/05/22 0730   alum & mag hydroxide-simeth (MAALOX/MYLANTA) 200-200-20 MG/5ML suspension 30 mL  30 mL Oral Q4H PRN Waldon Merl F, NP   30 mL at 04/01/22 2321   ARIPiprazole ER (ABILIFY MAINTENA) injection 400 mg  400 mg Intramuscular Q28 days Omauri Boeve, [name], MD   400 mg at 03/25/22 1516   aspirin EC tablet 81 mg  81 mg Oral Daily [name], NP  81 mg at 04/05/22 0949   benzocaine (ORAJEL) 10 % mucosal gel   Mouth/Throat TID PRN Jacobe Study, [name], MD   Given at 04/04/22 0214   benztropine (COGENTIN) tablet 1 mg  1 mg Oral Daily Waldon Merl F, NP   1 mg at 04/05/22 0729   divalproex (DEPAKOTE) DR tablet 750 mg  750 mg Oral Q12H Aubriella Perezgarcia T, MD   750 mg at 04/05/22 [id number]   fenofibrate tablet 54 mg  54 mg Oral Daily Waldon Merl F, NP   54 mg at 04/05/22 0730   gemfibrozil (LOPID) tablet 600 mg  600 mg Oral BID Waldon Merl F, NP   600 mg at 04/05/22 0730   hydrOXYzine (ATARAX) tablet 50 mg  50 mg Oral Q4H PRN Haliey Romberg, [name], MD   50 mg at 04/04/22 0214   levothyroxine (SYNTHROID) tablet 88 mcg  88 mcg Oral Q0600 [name], NP   88 mcg at 04/05/22 0610   lisinopril (ZESTRIL) tablet 10 mg  10  mg Oral Daily Waldon Merl F, NP   10 mg at 04/05/22 0731   magnesium hydroxide (MILK OF MAGNESIA) suspension 30 mL  30 mL Oral Daily PRN Waldon Merl F, NP   30 mL at 04/05/22 0730   metFORMIN (GLUCOPHAGE) tablet 1,000 mg  1,000 mg Oral BID WC Waldon Merl F, NP   1,000 mg at 04/05/22 0729   OLANZapine (ZYPREXA) tablet 20 mg  20 mg Oral QHS Chavez Rosol T, MD   20 mg at 04/04/22 2105   pioglitazone (ACTOS) tablet 45 mg  45 mg Oral Daily Waldon Merl F, NP   45 mg at 04/05/22 0730   pneumococcal 20-valent conjugate vaccine (PREVNAR 20) injection 0.5 mL  0.5 mL Intramuscular Tomorrow-1000 Amandeep Nesmith T, MD       simvastatin (ZOCOR) tablet 20 mg  20 mg Oral QHS Waldon Merl F, NP   20 mg at 04/04/22 2105   traZODone (DESYREL) tablet 150 mg  150 mg Oral QHS Azie Mcconahy, [name], MD   150 mg at 04/04/22 2105    Lab Results: No results found for this or any previous visit (from the past 48 hour(s)).  Blood Alcohol level:  Lab Results  Component Value Date   ETH <10 03/15/2022   ETH <10 [id number]    Metabolic Disorder Labs: Lab Results  Component Value Date   HGBA1C 6.0 (H) 03/25/2022   MPG 125.5 03/25/2022   MPG 154.2 02/08/2022   No results found for: "PROLACTIN" Lab Results  Component Value Date   CHOL 95 03/25/2022   TRIG 52 03/25/2022   HDL 42 03/25/2022   CHOLHDL 2.3 03/25/2022   VLDL 10 03/25/2022   LDLCALC 43 03/25/2022   LDLCALC 42 02/08/2022    Physical Findings: AIMS:  , ,  ,  ,    CIWA:    COWS:     Musculoskeletal: Strength & Muscle Tone: within normal limits Gait & Station: normal Patient leans: N/A  Psychiatric Specialty Exam:  Presentation  General Appearance: Appropriate for Environment  Eye Contact:Fair  Speech:Clear and Coherent  Speech Volume:Normal  Handedness:Right   Mood and Affect  Mood:Euthymic  Affect:Congruent   Thought Process  Thought Processes:Coherent  Descriptions of  Associations:Intact  Orientation:Full (Time, Place and Person)  Thought Content:Logical  History of Schizophrenia/Schizoaffective disorder:Yes  Duration of Psychotic Symptoms:Greater than six months  Hallucinations:No data recorded Ideas of Reference:None  Suicidal Thoughts:No data recorded Homicidal Thoughts:No data recorded  Sensorium  Memory:Immediate Fair; Remote Fair  Judgment:Fair  Insight:Present   Executive Functions  Concentration:Fair  Attention Span:Fair  [place]   Psychomotor Activity  Psychomotor Activity:No data recorded  Assets  Assets:Communication Skills; Financial Resources/Insurance; Housing; Social Support   Sleep  Sleep:No data recorded   Physical Exam: Physical Exam Vitals and nursing note reviewed.  Constitutional:      Appearance: Normal appearance.  HENT:     Head: Normocephalic and atraumatic.     Mouth/Throat:     Pharynx: Oropharynx is clear.  Eyes:     Pupils: Pupils are equal, round, and reactive to light.  Cardiovascular:     Rate and Rhythm: Normal rate and regular rhythm.  Pulmonary:     Effort: Pulmonary effort is normal.     Breath sounds: Normal breath sounds.  Abdominal:     General: Abdomen is flat.     Palpations: Abdomen is soft.  Musculoskeletal:        General: Normal range of motion.  Skin:    General: Skin is warm and dry.  Neurological:     General: No focal deficit present.     Mental Status: He is alert. Mental status is at baseline.  Psychiatric:        Mood and Affect: Mood normal.        Thought Content: Thought content normal.    Review of Systems  Constitutional: Negative.   HENT: Negative.    Eyes: Negative.   Respiratory: Negative.    Cardiovascular: Negative.   Gastrointestinal: Negative.   Musculoskeletal: Negative.   Skin: Negative.   Neurological: Negative.   Psychiatric/Behavioral: Negative.     Blood pressure 131/67, pulse (!)  103, temperature 98.2 F (36.8 C), temperature source Oral, resp. rate 18, height 6\' 4"  (1.93 m), weight 101.2 kg, SpO2 100 %. Body mass index is 27.14 kg/m.   Treatment Plan Summary: Plan no change to overall treatment plan.  Continue current medicine.  Reassess daily for dangerousness.  Work with outpatient team in finding a living situation  Alethia Berthold, MD 04/05/2022, 2:11 PM

## 2022-04-05 NOTE — Progress Notes (Signed)
Pt denies SI/HI/AVH and verbally agrees to approach staff if these become apparent or before harming themselves/others. Rates depression 0/10. Rates anxiety 7/10. Rates pain 9/10. Pt has not had any issues. Pt has paced the unit and has been out of his room for most of the day. Pt will complain of random things at random times. Pt became very depressed at one point. Pt will stand in the hallway and just stare. Scheduled medications administered to pt, per MD orders. RN provided support and encouragement to pt. Q15 min safety checks implemented and continued. Pt safe on the unit. RN will continue to monitor and intervene as needed.  Problem: Education: Goal: Mental status will improve Outcome: Progressing Goal: Verbalization of understanding the information provided will improve Outcome: Progressing     04/05/22 0730  Psych Admission Type (Psych Patients Only)  Admission Status Voluntary  Psychosocial Assessment  Patient Complaints Anxiety  Eye Contact Fair  Facial Expression Sullen  Affect Sullen;Depressed  Speech Soft;Logical/coherent  Interaction Needy;Guarded  Motor Activity Shuffling  Appearance/Hygiene In scrubs;Disheveled  Behavior Characteristics Cooperative;Appropriate to situation;Anxious  Mood Anxious;Sullen  Thought Process  Coherency Circumstantial  Content Blaming self  Delusions None reported or observed  Perception WDL  Hallucination None reported or observed  Judgment Limited  Confusion None  Danger to Self  Current suicidal ideation? Denies  Danger to Others  Danger to Others None reported or observed

## 2022-04-05 NOTE — [facility] Group Notes (Signed)
[place] [facility] Group Notes:  (Nursing/MHT/Case Management/Adjunct)  Date:  04/05/2022  Time:  9:54 AM  Type of Therapy:   community meeting  Participation Level:  None  Participation Quality:  Drowsy  Affect:  Flat  Cognitive:  Lacking  Insight:  None  Engagement in Group:  None  Modes of Intervention:  Discussion and Education  Summary of Progress/Problems:  Derrick Atkins 04/05/2022, 9:54 AM

## 2022-04-05 NOTE — Plan of Care (Signed)
  Problem: Safety: Goal: Ability to remain free from injury will improve Outcome: Progressing   Problem: Safety: Goal: Periods of time without injury will increase Outcome: Progressing   

## 2022-04-05 NOTE — Group Note (Signed)
[facility] LCSW Group Therapy Note    Group Date: 04/05/2022 Start Time: 1300 End Time: 1400  Type of Therapy and Topic:  Group Therapy:  Overcoming Obstacles  Participation Level:  [facility] PARTICIPATION LEVEL: Did Not Attend   Description of Group:   In this group patients will be encouraged to explore what they see as obstacles to their own wellness and recovery. They will be guided to discuss their thoughts, feelings, and behaviors related to these obstacles. The group will process together ways to cope with barriers, with attention given to specific choices patients can make. Each patient will be challenged to identify changes they are motivated to make in order to overcome their obstacles. This group will be process-oriented, with patients participating in exploration of their own experiences as well as giving and receiving support and challenge from other group members.  Therapeutic Goals: 1. Patient will identify personal and current obstacles as they relate to admission. 2. Patient will identify barriers that currently interfere with their wellness or overcoming obstacles.  3. Patient will identify feelings, thought process and behaviors related to these barriers. 4. Patient will identify two changes they are willing to make to overcome these obstacles:    Summary of Patient Progress Group not held due to a complex discharge which spanned the entire day.   Therapeutic Modalities:   Cognitive Behavioral Therapy Solution Focused Therapy Motivational Interviewing Relapse Prevention Therapy   Navjot Pilgrim R Marlyne Totaro, LCSW 

## 2022-04-05 NOTE — Progress Notes (Signed)
Patient in dayroom beginning of shift, did not answer questions in beginning. Came to med room for medications. No complaints or concerns voiced. Minimal interaction with staff and peers. Snack offered and accepted. Encouragement and support provided. Safety checks maintained. Meds given as prescribed. Pt remains safe on unit with q 15 min checks.

## 2022-04-05 NOTE — [facility] Counselor (Signed)
CSW received call from [place] ([phone]) of [employer]. Thayer Headings inquired regarding how pt was doing. CSW updated her regarding pt behavior over the past week but also noting that pt has been fine today. CSW inquired regarding ALF and if there was anything that needed to be done on the CSW's part. She stated that she did not believe that there was anything that needed to be done. She stated that she may try to come see pt tomorrow after 14:00. CSW agreed. No other concerns expressed. Contact ended without incident.  Chalmers Guest. Guerry Bruin, MSW, [place], [place] 04/05/2022 2:59 PM

## 2022-04-06 DIAGNOSIS — F203 Undifferentiated schizophrenia: Secondary | ICD-10-CM | POA: Diagnosis not present

## 2022-04-06 NOTE — Progress Notes (Signed)
Patient came up to the nurse's station stating to the security guard that he isn't feeling well. Patient stated to this writer that his right shoulder is feeling stiff and he requested PRN pain medication from this Probation officer. Patient was given PRN pain and anxiety medication. Patient tolerated medication well, without any issues. Patient remains safe on the unit and staff will continue to monitor.

## 2022-04-06 NOTE — Plan of Care (Signed)
  Problem: Nutrition: Goal: Adequate nutrition will be maintained Outcome: Progressing   Problem: Coping: Goal: Level of anxiety will decrease 04/06/2022 0451 by Jacinto Halim, RN Outcome: Progressing 04/06/2022 0403 by Jacinto Halim, RN Outcome: Progressing   Problem: Coping: Goal: Level of anxiety will decrease 04/06/2022 0451 by Jacinto Halim, RN Outcome: Progressing 04/06/2022 0403 by Jacinto Halim, RN Outcome: Progressing   Problem: Safety: Goal: Ability to remain free from injury will improve 04/06/2022 0451 by Jacinto Halim, RN Outcome: Progressing 04/06/2022 0403 by Jacinto Halim, RN Outcome: Progressing   Problem: Education: Goal: Verbalization of understanding the information provided will improve Outcome: Not Progressing

## 2022-04-06 NOTE — Progress Notes (Signed)
Pt calm and pleasant during assessment denies SI/HI, endorses A/H but did say they were non-commanding. Pt observed interacting appropriately with staff and peers on the unit. Pt compliant with medication administration per MD orders. Pt given education, support, and encouragement to be active in his treatment plan. Pt being monitored Q 15 minutes for safety per unit protocol, remains safe on the unit 

## 2022-04-06 NOTE — Progress Notes (Signed)
[facility] MD Progress Note  04/06/2022 3:30 PM Derrick Atkins  MRN:  [id number] Subjective: Follow-up 51 year old man with schizophrenia.  No new complaints.  Denies hallucinations.  Denies suicidal thoughts.  Behavior pretty calm.  No physical issues Principal Problem: Schizophrenia ([facility]) Diagnosis: Principal Problem:   Schizophrenia ([facility]) Active Problems:   Type 2 diabetes mellitus without complications ([facility])  Total Time spent with patient: 30 minutes  Past Psychiatric History: Past history of schizophrenia  Past Medical History:  Past Medical History:  Diagnosis Date   Cerebral palsy ([facility])    Depression    Diabetes mellitus without complication ([facility])    Hypertension    Kidney stones    Schizoaffective disorder ([facility])    Scoliosis     Past Surgical History:  Procedure Laterality Date   BOWEL RESECTION     LAPAROTOMY N/A 05/15/2020   Procedure: EXPLORATORY LAPAROTOMY;  Surgeon: Henrene Dodge, MD;  Location: [facility] ORS;  Service: General;  Laterality: N/A;   Family History:  Family History  Problem Relation Age of Onset   Heart disease Father    Heart attack Father    Family Psychiatric  History: See previous Social History:  Social History   Substance and Sexual Activity  Alcohol Use No     Social History   Substance and Sexual Activity  Drug Use No    Social History   Socioeconomic History   Marital status: Single    Spouse name: Not on file   Number of children: Not on file   Years of education: Not on file   Highest education level: Not on file  Occupational History   Not on file  Tobacco Use   Smoking status: Never   Smokeless tobacco: Never  Vaping Use   Vaping Use: Never used  Substance and Sexual Activity   Alcohol use: No   Drug use: No   Sexual activity: Not Currently  Other Topics Concern   Not on file  Social History Narrative   Not on file   Social Determinants of Health   Financial Resource Strain: Not on file  Food Insecurity: Food  Insecurity Present (03/23/2022)   Hunger Vital Sign    Worried About Running Out of Food in the Last Year: Often true    Ran Out of Food in the Last Year: Often true  Transportation Needs: Unmet Transportation Needs (03/23/2022)   PRAPARE - Administrator, Civil Service (Medical): Yes    Lack of Transportation (Non-Medical): Yes  Physical Activity: Not on file  Stress: Not on file  Social Connections: Not on file   Additional Social History:                         Sleep: Fair  Appetite:  Fair  Current Medications: Current Facility-Administered Medications  Medication Dose Route Frequency Provider Last Rate Last Admin   acetaminophen (TYLENOL) tablet 650 mg  650 mg Oral Q6H PRN Gabriel Cirri F, NP   650 mg at 04/06/22 0821   alum & mag hydroxide-simeth (MAALOX/MYLANTA) 200-200-20 MG/5ML suspension 30 mL  30 mL Oral Q4H PRN Gabriel Cirri F, NP   30 mL at 04/01/22 2321   ARIPiprazole ER (ABILIFY MAINTENA) injection 400 mg  400 mg Intramuscular Q28 days Veneda Kirksey T, MD   400 mg at 03/25/22 1516   aspirin EC tablet 81 mg  81 mg Oral Daily Gabriel Cirri F, NP   81 mg at 04/06/22 1143  benzocaine (ORAJEL) 10 % mucosal gel   Mouth/Throat TID PRN Colette Dicamillo, [name], MD   Given at 04/04/22 0214   benztropine (COGENTIN) tablet 1 mg  1 mg Oral Daily Waldon Merl F, NP   1 mg at 04/06/22 0820   divalproex (DEPAKOTE) DR tablet 750 mg  750 mg Oral Q12H Maicie Vanderloop T, MD   750 mg at 04/06/22 0820   fenofibrate tablet 54 mg  54 mg Oral Daily Waldon Merl F, NP   54 mg at 04/06/22 0820   gemfibrozil (LOPID) tablet 600 mg  600 mg Oral BID Waldon Merl F, NP   600 mg at 04/06/22 0820   hydrOXYzine (ATARAX) tablet 50 mg  50 mg Oral Q4H PRN Lewi Drost, [name], MD   50 mg at 04/05/22 1651   levothyroxine (SYNTHROID) tablet 88 mcg  88 mcg Oral Q0600 [name], NP   88 mcg at 04/06/22 0618   lisinopril (ZESTRIL) tablet 10 mg  10 mg Oral Daily Waldon Merl F, NP   10 mg at 04/06/22 8676   magnesium hydroxide (MILK OF MAGNESIA) suspension 30 mL  30 mL Oral Daily PRN Waldon Merl F, NP   30 mL at 04/05/22 0730   metFORMIN (GLUCOPHAGE) tablet 1,000 mg  1,000 mg Oral BID WC Waldon Merl F, NP   1,000 mg at 04/06/22 0820   OLANZapine (ZYPREXA) tablet 20 mg  20 mg Oral QHS Madalen Gavin T, MD   20 mg at 04/05/22 2053   pioglitazone (ACTOS) tablet 45 mg  45 mg Oral Daily Waldon Merl F, NP   45 mg at 04/06/22 0820   pneumococcal 20-valent conjugate vaccine (PREVNAR 20) injection 0.5 mL  0.5 mL Intramuscular Tomorrow-1000 Florestine Carmical T, MD       simvastatin (ZOCOR) tablet 20 mg  20 mg Oral QHS Waldon Merl F, NP   20 mg at 04/05/22 2053   traZODone (DESYREL) tablet 150 mg  150 mg Oral QHS Lallie Strahm, [name], MD   150 mg at 04/05/22 2052    Lab Results: No results found for this or any previous visit (from the past 48 hour(s)).  Blood Alcohol level:  Lab Results  Component Value Date   ETH <10 03/15/2022   ETH <10 19/50/9326    Metabolic Disorder Labs: Lab Results  Component Value Date   HGBA1C 6.0 (H) 03/25/2022   MPG 125.5 03/25/2022   MPG 154.2 02/08/2022   No results found for: "PROLACTIN" Lab Results  Component Value Date   CHOL 95 03/25/2022   TRIG 52 03/25/2022   HDL 42 03/25/2022   CHOLHDL 2.3 03/25/2022   VLDL 10 03/25/2022   LDLCALC 43 03/25/2022   LDLCALC 42 02/08/2022    Physical Findings: AIMS: Facial and Oral Movements Muscles of Facial Expression: None, normal Lips and Perioral Area: None, normal Jaw: None, normal Tongue: None, normal,Extremity Movements Upper (arms, wrists, hands, fingers): None, normal Lower (legs, knees, ankles, toes): None, normal, Trunk Movements Neck, shoulders, hips: None, normal, Overall Severity Severity of abnormal movements (highest score from questions above): None, normal Incapacitation due to abnormal movements: None, normal Patient's awareness of abnormal  movements (rate only patient's report): No Awareness, Dental Status Current problems with teeth and/or dentures?: No Does patient usually wear dentures?: No  CIWA:    COWS:     Musculoskeletal: Strength & Muscle Tone: within normal limits Gait & Station: normal Patient leans: N/A  Psychiatric Specialty Exam:  Presentation  General Appearance: Appropriate for  Environment  Eye Contact:Fair  Speech:Clear and Coherent  Speech Volume:Normal  Handedness:Right   Mood and Affect  Mood:Euthymic  Affect:Congruent   Thought Process  Thought Processes:Coherent  Descriptions of Associations:Intact  Orientation:Full (Time, Place and Person)  Thought Content:Logical  History of Schizophrenia/Schizoaffective disorder:Yes  Duration of Psychotic Symptoms:Greater than six months  Hallucinations:No data recorded Ideas of Reference:None  Suicidal Thoughts:No data recorded Homicidal Thoughts:No data recorded  Sensorium  Memory:Immediate Fair; Remote Fair  Judgment:Fair  Insight:Present   Executive Functions  Concentration:Fair  Attention Span:Fair  Recall:Fair  Fund of Knowledge:Fair  Language:Fair   Psychomotor Activity  Psychomotor Activity:No data recorded  Assets  Assets:Communication Skills; Financial Resources/Insurance; Housing; Social Support   Sleep  Sleep:No data recorded   Physical Exam: Physical Exam Vitals and nursing note reviewed.  Constitutional:      Appearance: Normal appearance.  HENT:     Head: Normocephalic and atraumatic.     Mouth/Throat:     Pharynx: Oropharynx is clear.  Eyes:     Pupils: Pupils are equal, round, and reactive to light.  Cardiovascular:     Rate and Rhythm: Normal rate and regular rhythm.  Pulmonary:     Effort: Pulmonary effort is normal.     Breath sounds: Normal breath sounds.  Abdominal:     General: Abdomen is flat.     Palpations: Abdomen is soft.  Musculoskeletal:        General: Normal  range of motion.  Skin:    General: Skin is warm and dry.  Neurological:     General: No focal deficit present.     Mental Status: He is alert. Mental status is at baseline.  Psychiatric:        Attention and Perception: Attention normal.        Mood and Affect: Mood normal. Affect is blunt.        Speech: Speech is delayed.        Behavior: Behavior is withdrawn.        Thought Content: Thought content normal.        Cognition and Memory: Cognition normal.    Review of Systems  Constitutional: Negative.   HENT: Negative.    Eyes: Negative.   Respiratory: Negative.    Cardiovascular: Negative.   Gastrointestinal: Negative.   Musculoskeletal: Negative.   Skin: Negative.   Neurological: Negative.   Psychiatric/Behavioral: Negative.     Blood pressure 130/81, pulse (!) 107, temperature 97.7 F (36.5 C), temperature source Oral, resp. rate 18, height 6\' 4"  (1.93 m), weight 101.2 kg, SpO2 100 %. Body mass index is 27.14 kg/m.   Treatment Plan Summary: Medication management and Plan I am told today that the outpatient team is losing faith that they will find a place for him to live and are suggesting we consider discharge.  I will communicate with the ACT team.  Possible discharge in a couple days if it looks like nothing is going to happen.  , MD 04/06/2022, 3:30 PM

## 2022-04-06 NOTE — Plan of Care (Signed)
D- Patient alert and oriented. Patient presented in a sullen, but pleasant mood on assessment reporting that he slept good last night, but had some complaints of nightmares and back pain. Patient rated his pain a "7/10", in which he did request PRN medication from this Probation officer. Patient endorsed hopelessness, anxiety, and depression on hi self-inventory. Patient stated to this writer that he is "emotionally and physically tired". Patient stated that he is hearing voices saying that "they're going to sue me". Patient denied SI, HI, VH at this time. Patient had no stated goals for today.  A- Scheduled medications administered to patient, per MD orders. Support and encouragement provided.  Routine safety checks conducted every 15 minutes.  Patient informed to notify staff with problems or concerns.  R- No adverse drug reactions noted. Patient contracts for safety at this time. Patient compliant with medications and treatment plan. Patient receptive, calm, and cooperative. Patient interacts well with others on the unit. Patient remains safe at this time.  Problem: Education: Goal: Knowledge of General Education information will improve Description: Including pain rating scale, medication(s)/side effects and non-pharmacologic comfort measures Outcome: Not Progressing   Problem: Health Behavior/Discharge Planning: Goal: Ability to manage health-related needs will improve Outcome: Not Progressing   Problem: Clinical Measurements: Goal: Ability to maintain clinical measurements within normal limits will improve Outcome: Not Progressing Goal: Will remain free from infection Outcome: Not Progressing Goal: Diagnostic test results will improve Outcome: Not Progressing Goal: Respiratory complications will improve Outcome: Not Progressing Goal: Cardiovascular complication will be avoided Outcome: Not Progressing   Problem: Activity: Goal: Risk for activity intolerance will decrease Outcome: Not  Progressing   Problem: Nutrition: Goal: Adequate nutrition will be maintained Outcome: Not Progressing   Problem: Coping: Goal: Level of anxiety will decrease Outcome: Not Progressing   Problem: Elimination: Goal: Will not experience complications related to bowel motility Outcome: Not Progressing Goal: Will not experience complications related to urinary retention Outcome: Not Progressing   Problem: Pain Managment: Goal: General experience of comfort will improve Outcome: Not Progressing   Problem: Safety: Goal: Ability to remain free from injury will improve Outcome: Not Progressing   Problem: Skin Integrity: Goal: Risk for impaired skin integrity will decrease Outcome: Not Progressing   Problem: Education: Goal: Knowledge of [place] [facility] Education information/materials will improve Outcome: Not Progressing Goal: Emotional status will improve Outcome: Not Progressing Goal: Mental status will improve Outcome: Not Progressing Goal: Verbalization of understanding the information provided will improve Outcome: Not Progressing   Problem: Health Behavior/Discharge Planning: Goal: Compliance with treatment plan for underlying cause of condition will improve Outcome: Not Progressing   Problem: Safety: Goal: Periods of time without injury will increase Outcome: Not Progressing   Problem: Coping: Goal: Coping ability will improve Outcome: Not Progressing   Problem: Self-Concept: Goal: Ability to disclose and discuss suicidal ideas will improve Outcome: Not Progressing Goal: Will verbalize positive feelings about self Outcome: Not Progressing   Problem: Self-Concept: Goal: Ability to identify factors that promote anxiety will improve Outcome: Not Progressing Goal: Level of anxiety will decrease Outcome: Not Progressing Goal: Ability to modify response to factors that promote anxiety will improve Outcome: Not Progressing

## 2022-04-06 NOTE — Progress Notes (Signed)
Patient isolative to self and room. Minimal interaction, just nods yes or no. Medication compliant. No complaints or concerns voiced. Encouragement and support provided. Safety checks maintained. Meds given as prescribed, pt receptive and remains safe on unit with q 15 min checks.

## 2022-04-06 NOTE — Group Note (Signed)
[facility] LCSW Group Therapy Note   Group Date: 04/06/2022 Start Time: 1300 End Time: 1400  Type of Therapy/Topic:  Group Therapy:  Feelings about Diagnosis  Participation Level:  None    Description of Group:    This group will allow patients to explore their thoughts and feelings about diagnoses they have received. Patients will be guided to explore their level of understanding and acceptance of these diagnoses. Facilitator will encourage patients to process their thoughts and feelings about the reactions of others to their diagnosis, and will guide patients in identifying ways to discuss their diagnosis with significant others in their lives. This group will be process-oriented, with patients participating in exploration of their own experiences as well as giving and receiving support and challenge from other group members.   Therapeutic Goals: 1. Patient will demonstrate understanding of diagnosis as evidence by identifying two or more symptoms of the disorder:  2. Patient will be able to express two feelings regarding the diagnosis 3. Patient will demonstrate ability to communicate their needs through discussion and/or role plays  Summary of Patient Progress: Patient was physically present for the entirety of group. He participated in the icebreaker but did not participate in the discussion further.    Therapeutic Modalities:   Cognitive Behavioral Therapy Brief Therapy Feelings Identification    Shirl Harris, LCSW

## 2022-04-06 NOTE — BH IP Treatment Plan (Signed)
Interdisciplinary Treatment and Diagnostic Plan Update  04/06/2022 Time of Session: 08:30 JAQUAWN SAFFRAN MRN: [id number]  Principal Diagnosis: Schizophrenia [facility])  Secondary Diagnoses: Principal Problem:   Schizophrenia ([place]) Active Problems:   Type 2 diabetes mellitus without complications ([place])   Current Medications:  Current Facility-Administered Medications  Medication Dose Route Frequency Provider Last Rate Last Admin   acetaminophen (TYLENOL) tablet 650 mg  650 mg Oral Q6H PRN Waldon Merl F, NP   650 mg at 04/06/22 0821   alum & mag hydroxide-simeth (MAALOX/MYLANTA) 200-200-20 MG/5ML suspension 30 mL  30 mL Oral Q4H PRN Waldon Merl F, NP   30 mL at 04/01/22 2321   ARIPiprazole ER (ABILIFY MAINTENA) injection 400 mg  400 mg Intramuscular Q28 days [name], [name], MD   400 mg at 03/25/22 1516   aspirin EC tablet 81 mg  81 mg Oral Daily Waldon Merl F, NP   81 mg at 04/05/22 0949   benzocaine (ORAJEL) 10 % mucosal gel   Mouth/Throat TID PRN [name], [name], MD   Given at 04/04/22 0214   benztropine (COGENTIN) tablet 1 mg  1 mg Oral Daily Waldon Merl F, NP   1 mg at 04/06/22 0820   divalproex (DEPAKOTE) DR tablet 750 mg  750 mg Oral Q12H [name], [name], MD   750 mg at 04/06/22 0820   fenofibrate tablet 54 mg  54 mg Oral Daily Waldon Merl F, NP   54 mg at 04/06/22 0820   gemfibrozil (LOPID) tablet 600 mg  600 mg Oral BID Waldon Merl F, NP   600 mg at 04/06/22 0820   hydrOXYzine (ATARAX) tablet 50 mg  50 mg Oral Q4H PRN [name], [name], MD   50 mg at 04/05/22 1651   levothyroxine (SYNTHROID) tablet 88 mcg  88 mcg Oral Q0600 [name], NP   88 mcg at 04/06/22 0618   lisinopril (ZESTRIL) tablet 10 mg  10 mg Oral Daily Waldon Merl F, NP   10 mg at 04/06/22 3536   magnesium hydroxide (MILK OF MAGNESIA) suspension 30 mL  30 mL Oral Daily PRN Waldon Merl F, NP   30 mL at 04/05/22 0730   metFORMIN (GLUCOPHAGE) tablet 1,000 mg  1,000 mg Oral BID  WC Waldon Merl F, NP   1,000 mg at 04/06/22 0820   OLANZapine (ZYPREXA) tablet 20 mg  20 mg Oral QHS [name], [name], MD   20 mg at 04/05/22 2053   pioglitazone (ACTOS) tablet 45 mg  45 mg Oral Daily Waldon Merl F, NP   45 mg at 04/06/22 0820   pneumococcal 20-valent conjugate vaccine (PREVNAR 20) injection 0.5 mL  0.5 mL Intramuscular Tomorrow-1000 [name], [name], MD       simvastatin (ZOCOR) tablet 20 mg  20 mg Oral QHS Waldon Merl F, NP   20 mg at 04/05/22 2053   traZODone (DESYREL) tablet 150 mg  150 mg Oral QHS [name], [name], MD   150 mg at 04/05/22 2052   PTA Medications: Medications Prior to Admission  Medication Sig Dispense Refill Last Dose   ARIPiprazole ER (ABILIFY MAINTENA) 400 MG SRER injection Inject 2 mLs (400 mg total) into the muscle every 28 (twenty-eight) days. Next dose due 03/18/22 1 each 1    aspirin EC 81 MG tablet Take 1 tablet (81 mg total) by mouth daily. Swallow whole. 30 tablet 1    benztropine (COGENTIN) 1 MG tablet Take 1 tablet (1 mg total) by mouth daily. (Patient  not taking: Reported on 03/09/2022) 30 tablet 1    fenofibrate 54 MG tablet Take 1 tablet (54 mg total) by mouth daily. 30 tablet 1    gemfibrozil (LOPID) 600 MG tablet Take 1 tablet (600 mg total) by mouth 2 (two) times daily. 60 tablet 1    levothyroxine (SYNTHROID) 88 MCG tablet Take 1 tablet (88 mcg total) by mouth daily. 30 tablet 1    lisinopril (ZESTRIL) 10 MG tablet Take 10 mg by mouth daily.      metFORMIN (GLUCOPHAGE) 1000 MG tablet Take 1 tablet (1,000 mg total) by mouth 2 (two) times daily with a meal. 60 tablet 1    pioglitazone (ACTOS) 45 MG tablet Take 1 tablet (45 mg total) by mouth daily. 30 tablet 1    simvastatin (ZOCOR) 20 MG tablet Take 1 tablet (20 mg total) by mouth at bedtime. 30 tablet 1    temazepam (RESTORIL) 15 MG capsule Take 1 capsule (15 mg total) by mouth at bedtime. 30 capsule 1     Patient Stressors: Financial difficulties   Medication change or  noncompliance    Patient Strengths: Automotive engineer for treatment/growth   Treatment Modalities: Medication Management, Group therapy, Case management,  1 to 1 session with clinician, Psychoeducation, Recreational therapy.   Physician Treatment Plan for Primary Diagnosis: Schizophrenia (HCC) Long Term Goal(s): Improvement in symptoms so as ready for discharge   Short Term Goals: Ability to verbalize feelings will improve Ability to disclose and discuss suicidal ideas Ability to demonstrate self-control will improve Ability to identify and develop effective coping behaviors will improve Ability to maintain clinical measurements within normal limits will improve Compliance with prescribed medications will improve  Medication Management: Evaluate patient's response, side effects, and tolerance of medication regimen.  Therapeutic Interventions: 1 to 1 sessions, Unit Group sessions and Medication administration.  Evaluation of Outcomes: Progressing  Physician Treatment Plan for Secondary Diagnosis: Principal Problem:   Schizophrenia (HCC) Active Problems:   Type 2 diabetes mellitus without complications (HCC)  Long Term Goal(s): Improvement in symptoms so as ready for discharge   Short Term Goals: Ability to verbalize feelings will improve Ability to disclose and discuss suicidal ideas Ability to demonstrate self-control will improve Ability to identify and develop effective coping behaviors will improve Ability to maintain clinical measurements within normal limits will improve Compliance with prescribed medications will improve     Medication Management: Evaluate patient's response, side effects, and tolerance of medication regimen.  Therapeutic Interventions: 1 to 1 sessions, Unit Group sessions and Medication administration.  Evaluation of Outcomes: Progressing   RN Treatment Plan for Primary Diagnosis: Schizophrenia (HCC) Long Term Goal(s):  Knowledge of disease and therapeutic regimen to maintain health will improve  Short Term Goals: Ability to remain free from injury will improve, Ability to verbalize frustration and anger appropriately will improve, Ability to demonstrate self-control, Ability to participate in decision making will improve, Ability to verbalize feelings will improve, Ability to disclose and discuss suicidal ideas, Ability to identify and develop effective coping behaviors will improve, and Compliance with prescribed medications will improve  Medication Management: RN will administer medications as ordered by provider, will assess and evaluate patient's response and provide education to patient for prescribed medication. RN will report any adverse and/or side effects to prescribing provider.  Therapeutic Interventions: 1 on 1 counseling sessions, Psychoeducation, Medication administration, Evaluate responses to treatment, Monitor vital signs and CBGs as ordered, Perform/monitor CIWA, COWS, AIMS and Fall Risk screenings as ordered,  Perform wound care treatments as ordered.  Evaluation of Outcomes: Progressing   LCSW Treatment Plan for Primary Diagnosis: Schizophrenia (HCC) Long Term Goal(s): Safe transition to appropriate next level of care at discharge, Engage patient in therapeutic group addressing interpersonal concerns.  Short Term Goals: Engage patient in aftercare planning with referrals and resources, Increase social support, Increase ability to appropriately verbalize feelings, Increase emotional regulation, Facilitate acceptance of mental health diagnosis and concerns, and Increase skills for wellness and recovery  Therapeutic Interventions: Assess for all discharge needs, 1 to 1 time with Social worker, Explore available resources and support systems, Assess for adequacy in community support network, Educate family and significant other(s) on suicide prevention, Complete Psychosocial Assessment, Interpersonal  group therapy.  Evaluation of Outcomes: Progressing   Progress in Treatment: Attending groups: Yes. Participating in groups: Yes. Taking medication as prescribed: Yes. Toleration medication: Yes. Family/Significant other contact made: Yes, individual(s) contacted:  guardian, [name]. Patient understands diagnosis: Yes. Discussing patient identified problems/goals with staff: Yes. Medical problems stabilized or resolved: Yes. Denies suicidal/homicidal ideation: Yes. Issues/concerns per patient self-inventory: No. Other: none.  New problem(s) identified: No, Describe:  none identified. 03/29/22 Update: No changes at this time. 04/06/22 Update: No changes at this time.   New Short Term/Long Term Goal(s): elimination of symptoms of psychosis, medication management for mood stabilization; elimination of SI thoughts; development of comprehensive mental wellness plan. 03/29/22 Update: No changes at this time. 04/06/22 Update: No changes at this time.   Patient Goals:  "I just feel like I don't have any friends here." 03/29/22 Update: No changes at this time. 04/06/22 Update: No changes at this time.   Discharge Plan or Barriers: CSW will assist pt with development of an appropriate aftercare/discharge plan. Currently pt is interested in group home placement. 03/29/22 Update: Pt became agitated last night with both staff and peers. 04/06/22 Update: Last week pt was agitated but appears better so far this week. Currently waiting to hear back from [facility] lead regarding potential ALF placement. Also [facility] assisting with looking for ALF as well. Uncertain regarding pt disposition post discharge.    Reason for Continuation of Hospitalization: Delusions  Hallucinations Medication stabilization Other; describe psychosis.   Estimated Length of Stay: 1-7 days 03/29/22 Update: No changes at this time. 04/06/22 Update: No changes at this time.  Last 3 [place] Suicide Severity Risk  Score: Flowsheet Row Admission (Current) from 03/23/2022 in [facility] INPATIENT [facility]AL MEDICINE ED from 03/15/2022 in [facility] EMERGENCY [facility]PARTMENT ED from 03/12/2022 in [facility]  C-SSRS RISK CATEGORY No Risk Low Risk High Risk       Last PHQ 2/9 Scores:     No data to display          Scribe for Treatment Team: Glenis Smoker, LCSW 04/06/2022 9:59 AM

## 2022-04-06 NOTE — Plan of Care (Signed)
  Problem: Nutrition: Goal: Adequate nutrition will be maintained Outcome: Progressing   Problem: Coping: Goal: Level of anxiety will decrease Outcome: Progressing   Problem: Safety: Goal: Ability to remain free from injury will improve Outcome: Progressing   Problem: Education: Goal: Verbalization of understanding the information provided will improve Outcome: Not Progressing

## 2022-04-07 DIAGNOSIS — F203 Undifferentiated schizophrenia: Secondary | ICD-10-CM | POA: Diagnosis not present

## 2022-04-07 NOTE — Progress Notes (Signed)
[facility] MD Progress Note  04/07/2022 3:04 PM Derrick Atkins  MRN:  [id number] Subjective: Patient seen and chart reviewed.  Patient stays calm.  No new behavior problems.  Denies hallucinations.  Compliant. Principal Problem: Schizophrenia (HCC) Diagnosis: Principal Problem:   Schizophrenia (HCC) Active Problems:   Type 2 diabetes mellitus without complications (HCC)  Total Time spent with patient: 30 minutes  Past Psychiatric History: Past history of psychosis  Past Medical History:  Past Medical History:  Diagnosis Date   Cerebral palsy (HCC)    Depression    Diabetes mellitus without complication (HCC)    Hypertension    Kidney stones    Schizoaffective disorder (HCC)    Scoliosis     Past Surgical History:  Procedure Laterality Date   BOWEL RESECTION     LAPAROTOMY N/A 05/15/2020   Procedure: EXPLORATORY LAPAROTOMY;  Surgeon: Henrene Dodge, MD;  Location: [facility] ORS;  Service: General;  Laterality: N/A;   Family History:  Family History  Problem Relation Age of Onset   Heart disease Father    Heart attack Father    Family Psychiatric  History: See previous Social History:  Social History   Substance and Sexual Activity  Alcohol Use No     Social History   Substance and Sexual Activity  Drug Use No    Social History   Socioeconomic History   Marital status: Single    Spouse name: Not on file   Number of children: Not on file   Years of education: Not on file   Highest education level: Not on file  Occupational History   Not on file  Tobacco Use   Smoking status: Never   Smokeless tobacco: Never  Vaping Use   Vaping Use: Never used  Substance and Sexual Activity   Alcohol use: No   Drug use: No   Sexual activity: Not Currently  Other Topics Concern   Not on file  Social History Narrative   Not on file   Social Determinants of Health   Financial Resource Strain: Not on file  Food Insecurity: Food Insecurity Present (03/23/2022)   Hunger Vital  Sign    Worried About Running Out of Food in the Last Year: Often true    Ran Out of Food in the Last Year: Often true  Transportation Needs: Unmet Transportation Needs (03/23/2022)   PRAPARE - Administrator, Civil Service (Medical): Yes    Lack of Transportation (Non-Medical): Yes  Physical Activity: Not on file  Stress: Not on file  Social Connections: Not on file   Additional Social History:                         Sleep: Fair  Appetite:  Fair  Current Medications: Current Facility-Administered Medications  Medication Dose Route Frequency Provider Last Rate Last Admin   acetaminophen (TYLENOL) tablet 650 mg  650 mg Oral Q6H PRN Gabriel Cirri F, NP   650 mg at 04/06/22 1745   alum & mag hydroxide-simeth (MAALOX/MYLANTA) 200-200-20 MG/5ML suspension 30 mL  30 mL Oral Q4H PRN Gabriel Cirri F, NP   30 mL at 04/01/22 2321   ARIPiprazole ER (ABILIFY MAINTENA) injection 400 mg  400 mg Intramuscular Q28 days Lyllie Cobbins T, MD   400 mg at 03/25/22 1516   aspirin EC tablet 81 mg  81 mg Oral Daily Gabriel Cirri F, NP   81 mg at 04/07/22 1051   benzocaine (ORAJEL) 10 %  mucosal gel   Mouth/Throat TID PRN Keziah Drotar, [name], MD   Given at 04/04/22 0214   benztropine (COGENTIN) tablet 1 mg  1 mg Oral Daily Gabriel Cirri F, NP   1 mg at 04/07/22 0807   divalproex (DEPAKOTE) DR tablet 750 mg  750 mg Oral Q12H Varsha Knock T, MD   750 mg at 04/07/22 9371   fenofibrate tablet 54 mg  54 mg Oral Daily Gabriel Cirri F, NP   54 mg at 04/07/22 0807   gemfibrozil (LOPID) tablet 600 mg  600 mg Oral BID Gabriel Cirri F, NP   600 mg at 04/07/22 6967   hydrOXYzine (ATARAX) tablet 50 mg  50 mg Oral Q4H PRN Emerick Weatherly, [name], MD   50 mg at 04/07/22 0811   levothyroxine (SYNTHROID) tablet 88 mcg  88 mcg Oral Q0600 [name], NP   88 mcg at 04/07/22 8938   lisinopril (ZESTRIL) tablet 10 mg  10 mg Oral Daily Gabriel Cirri F, NP   10 mg at 04/07/22 1017   magnesium  hydroxide (MILK OF MAGNESIA) suspension 30 mL  30 mL Oral Daily PRN Gabriel Cirri F, NP   30 mL at 04/05/22 0730   metFORMIN (GLUCOPHAGE) tablet 1,000 mg  1,000 mg Oral BID WC Gabriel Cirri F, NP   1,000 mg at 04/07/22 0807   OLANZapine (ZYPREXA) tablet 20 mg  20 mg Oral QHS Amya Hlad T, MD   20 mg at 04/06/22 2117   pioglitazone (ACTOS) tablet 45 mg  45 mg Oral Daily Gabriel Cirri F, NP   45 mg at 04/07/22 5102   pneumococcal 20-valent conjugate vaccine (PREVNAR 20) injection 0.5 mL  0.5 mL Intramuscular Tomorrow-1000 Adellyn Capek T, MD       simvastatin (ZOCOR) tablet 20 mg  20 mg Oral QHS Gabriel Cirri F, NP   20 mg at 04/06/22 2117   traZODone (DESYREL) tablet 150 mg  150 mg Oral QHS Jennie Bolar, [name], MD   150 mg at 04/06/22 2116    Lab Results: No results found for this or any previous visit (from the past 48 hour(s)).  Blood Alcohol level:  Lab Results  Component Value Date   ETH <10 03/15/2022   ETH <10 03/12/2022    Metabolic Disorder Labs: Lab Results  Component Value Date   HGBA1C 6.0 (H) 03/25/2022   MPG 125.5 03/25/2022   MPG 154.2 02/08/2022   No results found for: "PROLACTIN" Lab Results  Component Value Date   CHOL 95 03/25/2022   TRIG 52 03/25/2022   HDL 42 03/25/2022   CHOLHDL 2.3 03/25/2022   VLDL 10 03/25/2022   LDLCALC 43 03/25/2022   LDLCALC 42 02/08/2022    Physical Findings: AIMS: Facial and Oral Movements Muscles of Facial Expression: None, normal Lips and Perioral Area: None, normal Jaw: None, normal Tongue: None, normal,Extremity Movements Upper (arms, wrists, hands, fingers): None, normal Lower (legs, knees, ankles, toes): None, normal, Trunk Movements Neck, shoulders, hips: None, normal, Overall Severity Severity of abnormal movements (highest score from questions above): None, normal Incapacitation due to abnormal movements: None, normal Patient's awareness of abnormal movements (rate only patient's report): No Awareness,  Dental Status Current problems with teeth and/or dentures?: No Does patient usually wear dentures?: No  CIWA:    COWS:     Musculoskeletal: Strength & Muscle Tone: within normal limits Gait & Station: normal Patient leans: N/A  Psychiatric Specialty Exam:  Presentation  General Appearance: Appropriate for Environment  Eye Contact:Fair  Speech:Clear and Coherent  Speech Volume:Normal  Handedness:Right   Mood and Affect  Mood:Euthymic  Affect:Congruent   Thought Process  Thought Processes:Coherent  Descriptions of Associations:Intact  Orientation:Full (Time, Place and Person)  Thought Content:Logical  History of Schizophrenia/Schizoaffective disorder:Yes  Duration of Psychotic Symptoms:Greater than six months  Hallucinations:No data recorded Ideas of Reference:None  Suicidal Thoughts:No data recorded Homicidal Thoughts:No data recorded  Sensorium  Memory:Immediate Fair; Remote Fair  Judgment:Fair  Insight:Present   Executive Functions  Concentration:Fair  Attention Span:Fair  [place]   Psychomotor Activity  Psychomotor Activity:No data recorded  Assets  Assets:Communication Skills; Financial Resources/Insurance; Housing; Social Support   Sleep  Sleep:No data recorded   Physical Exam: Physical Exam Vitals and nursing note reviewed.  Constitutional:      Appearance: Normal appearance.  HENT:     Head: Normocephalic and atraumatic.     Mouth/Throat:     Pharynx: Oropharynx is clear.  Eyes:     Pupils: Pupils are equal, round, and reactive to light.  Cardiovascular:     Rate and Rhythm: Normal rate and regular rhythm.  Pulmonary:     Effort: Pulmonary effort is normal.     Breath sounds: Normal breath sounds.  Abdominal:     General: Abdomen is flat.     Palpations: Abdomen is soft.  Musculoskeletal:        General: Normal range of motion.  Skin:    General: Skin is warm and  dry.  Neurological:     General: No focal deficit present.     Mental Status: He is alert. Mental status is at baseline.  Psychiatric:        Mood and Affect: Mood normal.        Thought Content: Thought content normal.    Review of Systems  Constitutional: Negative.   HENT: Negative.    Eyes: Negative.   Respiratory: Negative.    Cardiovascular: Negative.   Gastrointestinal: Negative.   Musculoskeletal: Negative.   Skin: Negative.   Neurological: Negative.   Psychiatric/Behavioral: Negative.     Blood pressure 129/67, pulse (!) 102, temperature 98.4 F (36.9 C), temperature source Oral, resp. rate 18, height 6\' 4"  (1.93 m), weight 101.2 kg, SpO2 100 %. Body mass index is 27.14 kg/m.   Treatment Plan Summary: Improved.  No aggression lately.  Denies hallucinations.  Still probably is going to have trouble functioning independently but apparently the assisted living facility option is following through.  [place] to let them know.  Alethia Berthold, MD 04/07/2022, 3:04 PM

## 2022-04-07 NOTE — Progress Notes (Signed)
Pt calm and pleasant during assessment denies SI/HI, endorses A/H but did say they were non-commanding. Pt observed interacting appropriately with staff and peers on the unit. Pt compliant with medication administration per MD orders. Pt given education, support, and encouragement to be active in his treatment plan. Pt being monitored Q 15 minutes for safety per unit protocol, remains safe on the unit 

## 2022-04-07 NOTE — Progress Notes (Signed)
Pt denies SI/HI/VH but endorses AH saying negative things and verbally agrees to approach staff if these become apparent or before harming themselves/others. Rates depression 7/10. Rates anxiety 8/10. Rates pain 0/10. Pt also reports that he feels he is not able to make decisions. Pt has slept some today and been out of his room for some. Pt is still very sullen and his eye contact gets worse throughout the day. Pt had a call from someone that claimed to be his girlfriend but when mentioned to him, he says that she is not and he doesn't know what she is to him. Scheduled medications administered to pt, per MD orders. RN provided support and encouragement to pt. Q15 min safety checks implemented and continued. Pt safe on the unit. RN will continue to monitor and intervene as needed.  Problem: Education: Goal: Emotional status will improve Outcome: Not Progressing   Problem: Self-Concept: Goal: Will verbalize positive feelings about self Outcome: Not Progressing   04/07/22 0811  Psych Admission Type (Psych Patients Only)  Admission Status Voluntary  Psychosocial Assessment  Patient Complaints Anxiety;Depression  Eye Contact Fair  Facial Expression Sullen;Sad  Affect Sad;Sullen  Speech Logical/coherent;Soft  Interaction Assertive  Motor Activity Slow  Appearance/Hygiene In scrubs;Poor hygiene;Disheveled  Behavior Characteristics Cooperative;Calm;Appropriate to situation  Mood Depressed;Anxious;Sullen;Pleasant  Thought Process  Coherency Circumstantial  Content Blaming self  Delusions None reported or observed  Perception Hallucinations  Hallucination Auditory  Judgment Impaired  Confusion None  Danger to Self  Current suicidal ideation? Denies  Danger to Others  Danger to Others None reported or observed

## 2022-04-07 NOTE — Group Note (Signed)
[facility] LCSW Group Therapy Note   Group Date: 04/07/2022 Start Time: 1300 End Time: 1400   Type of Therapy and Topic: Group Therapy: Avoiding Self-Sabotaging and Enabling Behaviors  Participation Level: Minimal  Mood: blunted   Description of Group:  In this group, patients will learn how to identify obstacles, self-sabotaging and enabling behaviors, as well as: what are they, why do we do them and what needs these behaviors meet. Discuss unhealthy relationships and how to have positive healthy boundaries with those that sabotage and enable. Explore aspects of self-sabotage and enabling in yourself and how to limit these self-destructive behaviors in everyday life.   Therapeutic Goals: 1. Patient will identify one obstacle that relates to self-sabotage and enabling behaviors 2. Patient will identify one personal self-sabotaging or enabling behavior they did prior to admission 3. Patient will state a plan to change the above identified behavior 4. Patient will demonstrate ability to communicate their needs through discussion and/or role play.    Summary of Patient Progress: Patient was present for the entirety of group session. Patient participated in opening and closing remarks. However, patient did not contribute at all to the topic of discussion despite encouraged participation.    Therapeutic Modalities:  Cognitive Behavioral Therapy Person-Centered Therapy Motivational Interviewing    Durenda Hurt, [place]

## 2022-04-08 DIAGNOSIS — F203 Undifferentiated schizophrenia: Secondary | ICD-10-CM | POA: Diagnosis not present

## 2022-04-08 MED ORDER — LISINOPRIL 10 MG PO TABS: 10.0000 mg | ORAL_TABLET | Freq: Every day | ORAL | 1 refills | Status: DC

## 2022-04-08 MED ORDER — TRAZODONE HCL 150 MG PO TABS: 150.0000 mg | ORAL_TABLET | Freq: Every day | ORAL | 1 refills | Status: DC

## 2022-04-08 MED ORDER — GEMFIBROZIL 600 MG PO TABS: 600.0000 mg | ORAL_TABLET | Freq: Two times a day (BID) | ORAL | 1 refills | Status: DC

## 2022-04-08 MED ORDER — HYDROXYZINE HCL 50 MG PO TABS: 50.0000 mg | ORAL_TABLET | ORAL | 1 refills | Status: DC | PRN

## 2022-04-08 MED ORDER — BENZTROPINE MESYLATE 1 MG PO TABS: 1.0000 mg | ORAL_TABLET | Freq: Every day | ORAL | 1 refills | Status: DC

## 2022-04-08 MED ORDER — METFORMIN HCL 1000 MG PO TABS: 1000.0000 mg | ORAL_TABLET | Freq: Two times a day (BID) | ORAL | 1 refills | Status: DC

## 2022-04-08 MED ORDER — PIOGLITAZONE HCL 45 MG PO TABS: 45.0000 mg | ORAL_TABLET | Freq: Every day | ORAL | 1 refills | Status: DC

## 2022-04-08 MED ORDER — OLANZAPINE 20 MG PO TABS: 20.0000 mg | ORAL_TABLET | Freq: Every day | ORAL | 1 refills | Status: DC

## 2022-04-08 MED ORDER — ARIPIPRAZOLE ER 400 MG IM SRER: 400.0000 mg | INTRAMUSCULAR | 1 refills | Status: DC

## 2022-04-08 MED ORDER — ASPIRIN 81 MG PO TBEC: 81.0000 mg | DELAYED_RELEASE_TABLET | Freq: Every day | ORAL | 12 refills | Status: DC

## 2022-04-08 MED ORDER — DIVALPROEX SODIUM 250 MG PO DR TAB: 750.0000 mg | DELAYED_RELEASE_TABLET | Freq: Two times a day (BID) | ORAL | 1 refills | Status: DC

## 2022-04-08 MED ORDER — SIMVASTATIN 20 MG PO TABS: 20.0000 mg | ORAL_TABLET | Freq: Every day | ORAL | 1 refills | Status: DC

## 2022-04-08 MED ORDER — FENOFIBRATE 54 MG PO TABS: 54.0000 mg | ORAL_TABLET | Freq: Every day | ORAL | 1 refills | Status: DC

## 2022-04-08 MED ORDER — LEVOTHYROXINE SODIUM 88 MCG PO TABS: 88.0000 ug | ORAL_TABLET | Freq: Every day | ORAL | 1 refills | Status: DC

## 2022-04-08 NOTE — Progress Notes (Signed)
This Probation officer spoke with Junita Push, Midwife, and was notified that the police wanted to come and take a picture of patient's eye. This Probation officer was also notified that this act was approved by risk management, and would be ok only if the patient is ok with this. This writer went to speak to patient to see if he would be ok with the police coming to take a picture. Patient asked this writer "what's it for", and this writer explained that this was for whatever investigation is going on regarding his black eye. Patient stated that he was ok with this. This Cabin crew and MD of this.

## 2022-04-08 NOTE — Progress Notes (Signed)
[facility] MD Progress Note  04/08/2022 2:11 PM Derrick Atkins  MRN:  [id number] Subjective: Follow-up patient with schizophrenia.  No new complaints.  Staying pretty calm today.  Not agitated. Principal Problem: Schizophrenia (HCC) Diagnosis: Principal Problem:   Schizophrenia (HCC) Active Problems:   Type 2 diabetes mellitus without complications (HCC)  Total Time spent with patient: 30 minutes  Past Psychiatric History: Past history of schizophrenia  Past Medical History:  Past Medical History:  Diagnosis Date   Cerebral palsy (HCC)    Depression    Diabetes mellitus without complication (HCC)    Hypertension    Kidney stones    Schizoaffective disorder (HCC)    Scoliosis     Past Surgical History:  Procedure Laterality Date   BOWEL RESECTION     LAPAROTOMY N/A 05/15/2020   Procedure: EXPLORATORY LAPAROTOMY;  Surgeon: Henrene Dodge, MD;  Location: [facility] ORS;  Service: General;  Laterality: N/A;   Family History:  Family History  Problem Relation Age of Onset   Heart disease Father    Heart attack Father    Family Psychiatric  History: See previous Social History:  Social History   Substance and Sexual Activity  Alcohol Use No     Social History   Substance and Sexual Activity  Drug Use No    Social History   Socioeconomic History   Marital status: Single    Spouse name: Not on file   Number of children: Not on file   Years of education: Not on file   Highest education level: Not on file  Occupational History   Not on file  Tobacco Use   Smoking status: Never   Smokeless tobacco: Never  Vaping Use   Vaping Use: Never used  Substance and Sexual Activity   Alcohol use: No   Drug use: No   Sexual activity: Not Currently  Other Topics Concern   Not on file  Social History Narrative   Not on file   Social Determinants of Health   Financial Resource Strain: Not on file  Food Insecurity: Food Insecurity Present (03/23/2022)   Hunger Vital Sign     Worried About Running Out of Food in the Last Year: Often true    Ran Out of Food in the Last Year: Often true  Transportation Needs: Unmet Transportation Needs (03/23/2022)   PRAPARE - Administrator, Civil Service (Medical): Yes    Lack of Transportation (Non-Medical): Yes  Physical Activity: Not on file  Stress: Not on file  Social Connections: Not on file   Additional Social History:                         Sleep: Fair  Appetite:  Fair  Current Medications: Current Facility-Administered Medications  Medication Dose Route Frequency Provider Last Rate Last Admin   acetaminophen (TYLENOL) tablet 650 mg  650 mg Oral Q6H PRN Gabriel Cirri F, NP   650 mg at 04/08/22 0826   alum & mag hydroxide-simeth (MAALOX/MYLANTA) 200-200-20 MG/5ML suspension 30 mL  30 mL Oral Q4H PRN Gabriel Cirri F, NP   30 mL at 04/01/22 2321   ARIPiprazole ER (ABILIFY MAINTENA) injection 400 mg  400 mg Intramuscular Q28 days Nissan Frazzini T, MD   400 mg at 03/25/22 1516   aspirin EC tablet 81 mg  81 mg Oral Daily Gabriel Cirri F, NP   81 mg at 04/08/22 0952   benzocaine (ORAJEL) 10 % mucosal gel  Mouth/Throat TID PRN Janayla Marik, [name], MD   Given at 04/04/22 0214   benztropine (COGENTIN) tablet 1 mg  1 mg Oral Daily Gabriel Cirri F, NP   1 mg at 04/08/22 0827   divalproex (DEPAKOTE) DR tablet 750 mg  750 mg Oral Q12H Takota Cahalan T, MD   750 mg at 04/08/22 0827   fenofibrate tablet 54 mg  54 mg Oral Daily Gabriel Cirri F, NP   54 mg at 04/08/22 0826   gemfibrozil (LOPID) tablet 600 mg  600 mg Oral BID Gabriel Cirri F, NP   600 mg at 04/08/22 2841   hydrOXYzine (ATARAX) tablet 50 mg  50 mg Oral Q4H PRN Nadalyn Deringer, [name], MD   50 mg at 04/07/22 0811   levothyroxine (SYNTHROID) tablet 88 mcg  88 mcg Oral Q0600 [name], NP   88 mcg at 04/08/22 3244   lisinopril (ZESTRIL) tablet 10 mg  10 mg Oral Daily Gabriel Cirri F, NP   10 mg at 04/08/22 0827   magnesium hydroxide  (MILK OF MAGNESIA) suspension 30 mL  30 mL Oral Daily PRN Gabriel Cirri F, NP   30 mL at 04/08/22 0827   metFORMIN (GLUCOPHAGE) tablet 1,000 mg  1,000 mg Oral BID WC Gabriel Cirri F, NP   1,000 mg at 04/08/22 0826   OLANZapine (ZYPREXA) tablet 20 mg  20 mg Oral QHS Dionne Knoop T, MD   20 mg at 04/07/22 2136   pioglitazone (ACTOS) tablet 45 mg  45 mg Oral Daily Gabriel Cirri F, NP   45 mg at 04/08/22 0102   pneumococcal 20-valent conjugate vaccine (PREVNAR 20) injection 0.5 mL  0.5 mL Intramuscular Tomorrow-1000 Manases Etchison T, MD       simvastatin (ZOCOR) tablet 20 mg  20 mg Oral QHS Gabriel Cirri F, NP   20 mg at 04/07/22 2136   traZODone (DESYREL) tablet 150 mg  150 mg Oral QHS Maricarmen Braziel, [name], MD   150 mg at 04/07/22 2137    Lab Results: No results found for this or any previous visit (from the past 48 hour(s)).  Blood Alcohol level:  Lab Results  Component Value Date   ETH <10 03/15/2022   ETH <10 03/12/2022    Metabolic Disorder Labs: Lab Results  Component Value Date   HGBA1C 6.0 (H) 03/25/2022   MPG 125.5 03/25/2022   MPG 154.2 02/08/2022   No results found for: "PROLACTIN" Lab Results  Component Value Date   CHOL 95 03/25/2022   TRIG 52 03/25/2022   HDL 42 03/25/2022   CHOLHDL 2.3 03/25/2022   VLDL 10 03/25/2022   LDLCALC 43 03/25/2022   LDLCALC 42 02/08/2022    Physical Findings: AIMS: Facial and Oral Movements Muscles of Facial Expression: None, normal Lips and Perioral Area: None, normal Jaw: None, normal Tongue: None, normal,Extremity Movements Upper (arms, wrists, hands, fingers): None, normal Lower (legs, knees, ankles, toes): None, normal, Trunk Movements Neck, shoulders, hips: None, normal, Overall Severity Severity of abnormal movements (highest score from questions above): None, normal Incapacitation due to abnormal movements: None, normal Patient's awareness of abnormal movements (rate only patient's report): No Awareness, Dental  Status Current problems with teeth and/or dentures?: No Does patient usually wear dentures?: No  CIWA:    COWS:     Musculoskeletal: Strength & Muscle Tone: within normal limits Gait & Station: normal Patient leans: N/A  Psychiatric Specialty Exam:  Presentation  General Appearance: Appropriate for Environment  Eye Contact:Fair  Speech:Clear and Coherent  Speech Volume:Normal  Handedness:Right   Mood and Affect  Mood:Euthymic  Affect:Congruent   Thought Process  Thought Processes:Coherent  Descriptions of Associations:Intact  Orientation:Full (Time, Place and Person)  Thought Content:Logical  History of Schizophrenia/Schizoaffective disorder:Yes  Duration of Psychotic Symptoms:Greater than six months  Hallucinations:No data recorded Ideas of Reference:None  Suicidal Thoughts:No data recorded Homicidal Thoughts:No data recorded  Sensorium  Memory:Immediate Fair; Remote Fair  Judgment:Fair  Insight:Present   Executive Functions  Concentration:Fair  Attention Span:Fair  [place]   Psychomotor Activity  Psychomotor Activity:No data recorded  Assets  Assets:Communication Skills; Financial Resources/Insurance; Housing; Social Support   Sleep  Sleep:No data recorded   Physical Exam: Physical Exam Vitals reviewed.  Constitutional:      Appearance: Normal appearance.  HENT:     Head: Normocephalic and atraumatic.     Mouth/Throat:     Pharynx: Oropharynx is clear.  Eyes:     Pupils: Pupils are equal, round, and reactive to light.  Cardiovascular:     Rate and Rhythm: Normal rate and regular rhythm.  Pulmonary:     Effort: Pulmonary effort is normal.     Breath sounds: Normal breath sounds.  Abdominal:     General: Abdomen is flat.     Palpations: Abdomen is soft.  Musculoskeletal:        General: Normal range of motion.  Skin:    General: Skin is warm and dry.  Neurological:      General: No focal deficit present.     Mental Status: He is alert. Mental status is at baseline.  Psychiatric:        Mood and Affect: Mood normal.        Thought Content: Thought content normal.    Review of Systems  Constitutional: Negative.   HENT: Negative.    Eyes: Negative.   Respiratory: Negative.    Cardiovascular: Negative.   Gastrointestinal: Negative.   Musculoskeletal: Negative.   Skin: Negative.   Neurological: Negative.   Psychiatric/Behavioral: Negative.     Blood pressure (!) 141/82, pulse (!) 118, temperature 98.9 F (37.2 C), temperature source Oral, resp. rate 18, height 6\' 4"  (1.93 m), weight 101.2 kg, SpO2 98 %. Body mass index is 27.14 kg/m.   Treatment Plan Summary: Medication management and Plan anticipate likely discharge tomorrow.  We will make preparations.  His ACT team is already being informed.  Alethia Berthold, MD 04/08/2022, 2:11 PM

## 2022-04-08 NOTE — Progress Notes (Signed)
Recreation Therapy Notes  Date: 04/08/2022   Time: 10:50 am   Location: Craft room    Behavioral response: N/A   Intervention Topic: Decision Making    Discussion/Intervention: Patient refused to attend group.   Clinical Observations/Feedback:  Patient refused to attend group.   Che Rachal LRT/CTRS          Marletta Bousquet 04/08/2022 11:06 AM

## 2022-04-08 NOTE — Progress Notes (Signed)
This Probation officer spoke with the patient's brother, Broadus John, who had concerns about his brother's injury and had called the police last night to independently investigate the matter. The brother expressed doubt that the patient could have hurt himself as much as was done. This Probation officer explained the process of investigations and assured him that the hospital will do everything possible to create a comforting, therapeutic environment. He was made aware that he can call the undersigned at any time and that we are being transparent and keeping him apprised of the status of the investigation .

## 2022-04-08 NOTE — BH IP Treatment Plan (Signed)
Interdisciplinary Treatment and Diagnostic Plan Update  04/08/2022 Time of Session: 08:30 Derrick Atkins MRN: [id number]  Principal Diagnosis: Schizophrenia [facility])  Secondary Diagnoses: Principal Problem:   Schizophrenia (HCC) Active Problems:   Type 2 diabetes mellitus without complications (HCC)   Current Medications:  Current Facility-Administered Medications  Medication Dose Route Frequency Provider Last Rate Last Admin   acetaminophen (TYLENOL) tablet 650 mg  650 mg Oral Q6H PRN Gabriel Cirri F, NP   650 mg at 04/08/22 0826   alum & mag hydroxide-simeth (MAALOX/MYLANTA) 200-200-20 MG/5ML suspension 30 mL  30 mL Oral Q4H PRN Gabriel Cirri F, NP   30 mL at 04/01/22 2321   ARIPiprazole ER (ABILIFY MAINTENA) injection 400 mg  400 mg Intramuscular Q28 days [name], [name], MD   400 mg at 03/25/22 1516   aspirin EC tablet 81 mg  81 mg Oral Daily Gabriel Cirri F, NP   81 mg at 04/07/22 1051   benzocaine (ORAJEL) 10 % mucosal gel   Mouth/Throat TID PRN [name], [name], MD   Given at 04/04/22 0214   benztropine (COGENTIN) tablet 1 mg  1 mg Oral Daily Gabriel Cirri F, NP   1 mg at 04/08/22 0827   divalproex (DEPAKOTE) DR tablet 750 mg  750 mg Oral Q12H [name], [name], MD   750 mg at 04/08/22 0827   fenofibrate tablet 54 mg  54 mg Oral Daily Gabriel Cirri F, NP   54 mg at 04/08/22 0826   gemfibrozil (LOPID) tablet 600 mg  600 mg Oral BID Gabriel Cirri F, NP   600 mg at 04/08/22 4097   hydrOXYzine (ATARAX) tablet 50 mg  50 mg Oral Q4H PRN [name], [name], MD   50 mg at 04/07/22 0811   levothyroxine (SYNTHROID) tablet 88 mcg  88 mcg Oral Q0600 [name], NP   88 mcg at 04/08/22 3532   lisinopril (ZESTRIL) tablet 10 mg  10 mg Oral Daily Gabriel Cirri F, NP   10 mg at 04/08/22 0827   magnesium hydroxide (MILK OF MAGNESIA) suspension 30 mL  30 mL Oral Daily PRN Gabriel Cirri F, NP   30 mL at 04/08/22 0827   metFORMIN (GLUCOPHAGE) tablet 1,000 mg  1,000 mg Oral BID  WC Gabriel Cirri F, NP   1,000 mg at 04/08/22 0826   OLANZapine (ZYPREXA) tablet 20 mg  20 mg Oral QHS [name], [name], MD   20 mg at 04/07/22 2136   pioglitazone (ACTOS) tablet 45 mg  45 mg Oral Daily Gabriel Cirri F, NP   45 mg at 04/08/22 9924   pneumococcal 20-valent conjugate vaccine (PREVNAR 20) injection 0.5 mL  0.5 mL Intramuscular Tomorrow-1000 [name], [name], MD       simvastatin (ZOCOR) tablet 20 mg  20 mg Oral QHS Gabriel Cirri F, NP   20 mg at 04/07/22 2136   traZODone (DESYREL) tablet 150 mg  150 mg Oral QHS [name], [name], MD   150 mg at 04/07/22 2137   PTA Medications: Medications Prior to Admission  Medication Sig Dispense Refill Last Dose   ARIPiprazole ER (ABILIFY MAINTENA) 400 MG SRER injection Inject 2 mLs (400 mg total) into the muscle every 28 (twenty-eight) days. Next dose due 03/18/22 1 each 1    aspirin EC 81 MG tablet Take 1 tablet (81 mg total) by mouth daily. Swallow whole. 30 tablet 1    benztropine (COGENTIN) 1 MG tablet Take 1 tablet (1 mg total) by mouth daily. (Patient  not taking: Reported on 03/09/2022) 30 tablet 1    fenofibrate 54 MG tablet Take 1 tablet (54 mg total) by mouth daily. 30 tablet 1    gemfibrozil (LOPID) 600 MG tablet Take 1 tablet (600 mg total) by mouth 2 (two) times daily. 60 tablet 1    levothyroxine (SYNTHROID) 88 MCG tablet Take 1 tablet (88 mcg total) by mouth daily. 30 tablet 1    lisinopril (ZESTRIL) 10 MG tablet Take 10 mg by mouth daily.      metFORMIN (GLUCOPHAGE) 1000 MG tablet Take 1 tablet (1,000 mg total) by mouth 2 (two) times daily with a meal. 60 tablet 1    pioglitazone (ACTOS) 45 MG tablet Take 1 tablet (45 mg total) by mouth daily. 30 tablet 1    simvastatin (ZOCOR) 20 MG tablet Take 1 tablet (20 mg total) by mouth at bedtime. 30 tablet 1    temazepam (RESTORIL) 15 MG capsule Take 1 capsule (15 mg total) by mouth at bedtime. 30 capsule 1     Patient Stressors: Financial difficulties   Medication change or  noncompliance    Patient Strengths: Automotive engineer for treatment/growth   Treatment Modalities: Medication Management, Group therapy, Case management,  1 to 1 session with clinician, Psychoeducation, Recreational therapy.   Physician Treatment Plan for Primary Diagnosis: Schizophrenia (HCC) Long Term Goal(s): Improvement in symptoms so as ready for discharge   Short Term Goals: Ability to verbalize feelings will improve Ability to disclose and discuss suicidal ideas Ability to demonstrate self-control will improve Ability to identify and develop effective coping behaviors will improve Ability to maintain clinical measurements within normal limits will improve Compliance with prescribed medications will improve  Medication Management: Evaluate patient's response, side effects, and tolerance of medication regimen.  Therapeutic Interventions: 1 to 1 sessions, Unit Group sessions and Medication administration.  Evaluation of Outcomes: Progressing  Physician Treatment Plan for Secondary Diagnosis: Principal Problem:   Schizophrenia (HCC) Active Problems:   Type 2 diabetes mellitus without complications (HCC)  Long Term Goal(s): Improvement in symptoms so as ready for discharge   Short Term Goals: Ability to verbalize feelings will improve Ability to disclose and discuss suicidal ideas Ability to demonstrate self-control will improve Ability to identify and develop effective coping behaviors will improve Ability to maintain clinical measurements within normal limits will improve Compliance with prescribed medications will improve     Medication Management: Evaluate patient's response, side effects, and tolerance of medication regimen.  Therapeutic Interventions: 1 to 1 sessions, Unit Group sessions and Medication administration.  Evaluation of Outcomes: Progressing   RN Treatment Plan for Primary Diagnosis: Schizophrenia (HCC) Long Term Goal(s):  Knowledge of disease and therapeutic regimen to maintain health will improve  Short Term Goals: Ability to remain free from injury will improve, Ability to verbalize frustration and anger appropriately will improve, Ability to demonstrate self-control, Ability to participate in decision making will improve, Ability to verbalize feelings will improve, Ability to disclose and discuss suicidal ideas, Ability to identify and develop effective coping behaviors will improve, and Compliance with prescribed medications will improve  Medication Management: RN will administer medications as ordered by provider, will assess and evaluate patient's response and provide education to patient for prescribed medication. RN will report any adverse and/or side effects to prescribing provider.  Therapeutic Interventions: 1 on 1 counseling sessions, Psychoeducation, Medication administration, Evaluate responses to treatment, Monitor vital signs and CBGs as ordered, Perform/monitor CIWA, COWS, AIMS and Fall Risk screenings as ordered,  Perform wound care treatments as ordered.  Evaluation of Outcomes: Progressing   LCSW Treatment Plan for Primary Diagnosis: Schizophrenia ([place]) Long Term Goal(s): Safe transition to appropriate next level of care at discharge, Engage patient in therapeutic group addressing interpersonal concerns.  Short Term Goals: Engage patient in aftercare planning with referrals and resources, Increase social support, Increase ability to appropriately verbalize feelings, Increase emotional regulation, Facilitate acceptance of mental health diagnosis and concerns, and Increase skills for wellness and recovery  Therapeutic Interventions: Assess for all discharge needs, 1 to 1 time with Social worker, Explore available resources and support systems, Assess for adequacy in community support network, Educate family and significant other(s) on suicide prevention, Complete Psychosocial Assessment, Interpersonal  group therapy.  Evaluation of Outcomes: Progressing   Progress in Treatment: Attending groups: Yes. Participating in groups: Yes. Taking medication as prescribed: Yes. Toleration medication: Yes. Family/Significant other contact made: Yes, individual(s) contacted:  guardian, Denman George. Patient understands diagnosis: Yes. Discussing patient identified problems/goals with staff: Yes. Medical problems stabilized or resolved: Yes. Denies suicidal/homicidal ideation: Yes. Issues/concerns per patient self-inventory: No. Other: none.  New problem(s) identified: No, Describe:  none identified. 03/29/22 Update: No changes at this time. 04/06/22 Update: No changes at this time. 04/08/22 Update: No changes at this time.    New Short Term/Long Term Goal(s): elimination of symptoms of psychosis, medication management for mood stabilization; elimination of SI thoughts; development of comprehensive mental wellness plan. 03/29/22 Update: No changes at this time. 04/06/22 Update: No changes at this time. 04/08/22 Update: No changes at this time.   Patient Goals:  "I just feel like I don't have any friends here." 03/29/22 Update: No changes at this time. 04/06/22 Update: No changes at this time. 04/08/22 Update: No changes at this time.   Discharge Plan or Barriers: CSW will assist pt with development of an appropriate aftercare/discharge plan. Currently pt is interested in group home placement. 03/29/22 Update: Pt became agitated last night with both staff and peers. 04/06/22 Update: Last week pt was agitated but appears better so far this week. Currently waiting to hear back from ACTT lead regarding potential ALF placement. Also [facility] assisting with looking for ALF as well. Uncertain regarding pt disposition post discharge. 04/08/22 Update: No changes at this time.   Reason for Continuation of Hospitalization: Delusions  Hallucinations Medication stabilization Other; describe psychosis.   Estimated  Length of Stay: 1-7 days 03/29/22 Update: No changes at this time. 04/06/22 Update: No changes at this time. 04/08/22 Update: TBD  Last 3 [place] Suicide Severity Risk Score: Flowsheet Row Admission (Current) from 03/23/2022 in [place] ED from 03/15/2022 in [place] ED from 03/12/2022 in [place] No Risk Low Risk High Risk       Last PHQ 2/9 Scores:     No data to display          Scribe for Treatment Team: Shirl Harris, LCSW 04/08/2022 9:10 AM

## 2022-04-08 NOTE — Plan of Care (Signed)
D- Patient alert and oriented. Patient presented in a sullen, but pleasant mood on assessment reporting that he slept "good" last night and had complaints of left rib cage pain, as well as some constipation issues. Patient rated his pain a "7/10", in which he requested PRN pain medication and a laxative from this Probation officer. Patient endorsed depression, hopelessness, and anxiety on his self-inventory, however, he did not go into detail as to why he's feeling this way. Although patient endorsed SI on his self-inventory, he denied it to this Probation officer during assessment. Patient also denied HI/AVH at this time. Patient had no stated goals for today.  A- Scheduled medications administered to patient, per MD orders. Support and encouragement provided.  Routine safety checks conducted every 15 minutes.  Patient informed to notify staff with problems or concerns.  R- No adverse drug reactions noted. Patient contracts for safety at this time. Patient compliant with medications. Patient receptive, calm, and cooperative, although he has not attended any unit groups today. Patient remains safe at this time.  Problem: Education: Goal: Knowledge of General Education information will improve Description: Including pain rating scale, medication(s)/side effects and non-pharmacologic comfort measures Outcome: Progressing   Problem: Health Behavior/Discharge Planning: Goal: Ability to manage health-related needs will improve Outcome: Progressing   Problem: Clinical Measurements: Goal: Ability to maintain clinical measurements within normal limits will improve Outcome: Progressing Goal: Will remain free from infection Outcome: Progressing Goal: Diagnostic test results will improve Outcome: Progressing Goal: Respiratory complications will improve Outcome: Progressing Goal: Cardiovascular complication will be avoided Outcome: Progressing   Problem: Activity: Goal: Risk for activity intolerance will  decrease Outcome: Progressing   Problem: Nutrition: Goal: Adequate nutrition will be maintained Outcome: Progressing   Problem: Coping: Goal: Level of anxiety will decrease Outcome: Progressing   Problem: Elimination: Goal: Will not experience complications related to bowel motility Outcome: Progressing Goal: Will not experience complications related to urinary retention Outcome: Progressing   Problem: Pain Managment: Goal: General experience of comfort will improve Outcome: Progressing   Problem: Safety: Goal: Ability to remain free from injury will improve Outcome: Progressing   Problem: Skin Integrity: Goal: Risk for impaired skin integrity will decrease Outcome: Progressing   Problem: Education: Goal: Knowledge of [facility] Education information/materials will improve Outcome: Progressing Goal: Emotional status will improve Outcome: Progressing Goal: Mental status will improve Outcome: Progressing Goal: Verbalization of understanding the information provided will improve Outcome: Progressing   Problem: Health Behavior/Discharge Planning: Goal: Compliance with treatment plan for underlying cause of condition will improve Outcome: Progressing   Problem: Safety: Goal: Periods of time without injury will increase Outcome: Progressing   Problem: Coping: Goal: Coping ability will improve Outcome: Progressing   Problem: Self-Concept: Goal: Ability to disclose and discuss suicidal ideas will improve Outcome: Progressing Goal: Will verbalize positive feelings about self Outcome: Progressing   Problem: Self-Concept: Goal: Ability to identify factors that promote anxiety will improve Outcome: Progressing Goal: Level of anxiety will decrease Outcome: Progressing Goal: Ability to modify response to factors that promote anxiety will improve Outcome: Progressing

## 2022-04-08 NOTE — Progress Notes (Signed)
Pt calm and pleasant during assessment denies SI/HI, endorses A/H but did say they were non-commanding. Pt observed interacting appropriately with staff and peers on the unit. Pt compliant with medication administration per MD orders. Pt given education, support, and encouragement to be active in his treatment plan. Pt being monitored Q 15 minutes for safety per unit protocol, remains safe on the unit

## 2022-04-08 NOTE — Group Note (Signed)
[facility] LCSW Group Therapy Note   Group Date: 04/08/2022 Start Time: 1300 End Time: 1400   Type of Therapy/Topic:  Group Therapy:  Balance in Life  Participation Level:  None   Description of Group:    This group will address the concept of balance and how it feels and looks when one is unbalanced. Patients will be encouraged to process areas in their lives that are out of balance, and identify reasons for remaining unbalanced. Facilitators will guide patients utilizing problem- solving interventions to address and correct the stressor making their life unbalanced. Understanding and applying boundaries will be explored and addressed for obtaining  and maintaining a balanced life. Patients will be encouraged to explore ways to assertively make their unbalanced needs known to significant others in their lives, using other group members and facilitator for support and feedback.  Therapeutic Goals: Patient will identify two or more emotions or situations they have that consume much of in their lives. Patient will identify signs/triggers that life has become out of balance:  Patient will identify two ways to set boundaries in order to achieve balance in their lives:  Patient will demonstrate ability to communicate their needs through discussion and/or role plays  Summary of Patient Progress: Patient was present for the entirety of the group process, however, he did not participate in the discussion.    Therapeutic Modalities:   Cognitive Behavioral Therapy Solution-Focused Therapy Assertiveness Training   Shirl Harris, LCSW

## 2022-04-08 NOTE — [facility] Group Notes (Signed)
[facility] Group Notes:  (Nursing/MHT/Case Management/Adjunct)  Date:  04/08/2022  Time:  11:23 AM  Type of Therapy:   community meeting  Participation Level:  Did Not Attend    Bhavin Monjaraz P Jhoan Schmieder 04/08/2022, 11:23 AM 

## 2022-04-09 DIAGNOSIS — F203 Undifferentiated schizophrenia: Secondary | ICD-10-CM | POA: Diagnosis not present

## 2022-04-09 NOTE — Progress Notes (Signed)
Recreation Therapy Notes  INPATIENT RECREATION TR PLAN  Patient Details Name: Derrick Atkins MRN: [id number] DOB: [birth year] Today's Date: 04/09/2022  Rec Therapy Plan Is patient appropriate for Therapeutic Recreation?: Yes Treatment times per week: at least 3 Estimated Length of Stay: 5-7 days TR Treatment/Interventions: Group participation (Comment)  Discharge Criteria Pt will be discharged from therapy if:: Discharged Treatment plan/goals/alternatives discussed and agreed upon by:: Patient/family  Discharge Summary Short term goals set: Patient will engage in groups without prompting or encouragement from LRT x3 group sessions within 5 recreation therapy group sessions Short term goals met: Complete Progress toward goals comments: Groups attended Which groups?: Social skills, Coping skills, Wellness, Leisure education, Other (Comment) (Relaxation, Happiness) Reason goals not met: N/A Therapeutic equipment acquired: N/A Reason patient discharged from therapy: Discharge from hospital Pt/family agrees with progress & goals achieved: Yes Date patient discharged from therapy: 04/09/22   [name] 04/09/2022, 12:58 PM

## 2022-04-09 NOTE — Progress Notes (Signed)
  [facility] Adult Case Management Discharge Plan :  Will you be returning to the same living situation after discharge:  Yes,  guardian verified that pt can return home. At discharge, do you have transportation home?: Yes,  CSW to arrange.  Do you have the ability to pay for your medications: Yes,  [employer].  Release of information consent forms completed and in the chart;  Patient's signature needed at discharge.  Patient to Follow up at:  Follow-up [facility]. Follow up.   Why: Your ACTT services will resume on Friday, 04/09/22. They will be bringing your medications as well. Thanks! Contact information: [place] [address] [phone]                 Next level of care provider has access to [place] and Suicide Prevention discussed: Yes,  SPE completed with guardian, Denman George.      Has patient been referred to the Quitline?: N/A patient is not a smoker  Patient has been referred for addiction treatment: N/A  Shirl Harris, LCSW 04/09/2022, 11:26 AM

## 2022-04-09 NOTE — Progress Notes (Signed)
Writer offered patient fresh clothes and hygiene items. Patient refused shower items or clean clothing. Patient states, "I will take a shower when I get home".

## 2022-04-09 NOTE — Plan of Care (Signed)
D- Patient alert and oriented. Patient affect and mood is flat. Patient denies SI, HI, AVH. Patient informed Probation officer of mid back pain. Patient states he slept good last night. PRN tylenol was administered.  Patient states his goal is not have any pain and to go home.   A- Scheduled medications administered to patient, per MD orders. Support and encouragement provided.  Routine safety checks conducted every 15 minutes.  Patient informed to notify staff with problems or concerns.  R- No adverse drug reactions noted. Patient contracts for safety at this time. Patient compliant with medications and treatment plan. Patient receptive, calm, and cooperative. Patient did not interact well with others on the unit.  Patient remains safe at this time.   Problem: Health Behavior/Discharge Planning: Goal: Ability to manage health-related needs will improve Outcome: Progressing   Problem: Coping: Goal: Level of anxiety will decrease Outcome: Progressing   Problem: Education: Goal: Emotional status will improve Outcome: Progressing Goal: Mental status will improve Outcome: Progressing   Problem: Self-Concept: Goal: Ability to disclose and discuss suicidal ideas will improve Outcome: Progressing

## 2022-04-09 NOTE — Care Management Important Message (Signed)
Important Message  Patient Details  Name: Derrick Atkins MRN: [id number] Date of Birth: Feb 20, 1971   Medicare Important Message Given:  Other (see comment) (IM reviewed with guardian via telephone.)  Guardian was reminded of right to appeal discharge but declined stating, "He'll be back just save him a bed."  Shirl Harris, LCSW 04/09/2022, 1:07 PM

## 2022-04-09 NOTE — Progress Notes (Signed)
Recreation Therapy Notes   Date: 04/09/2022  Time: 10:30 am   Location: Craft room    Behavioral response: N/A   Intervention Topic: Time Management   Discussion/Intervention: Patient refused to attend group.   Clinical Observations/Feedback:  Patient refused to attend group.   Demontray Franta LRT/CTRS         [name] 04/09/2022 12:14 PM

## 2022-04-09 NOTE — BHH Suicide Risk Assessment (Signed)
[facility] Discharge Suicide Risk Assessment   Principal Problem: Schizophrenia [facility]) Discharge Diagnoses: Principal Problem:   Schizophrenia ([place]) Active Problems:   Type 2 diabetes mellitus without complications ([place])   Total Time spent with patient: 30 minutes  Musculoskeletal: Strength & Muscle Tone: within normal limits Gait & Station: normal Patient leans: N/A  Psychiatric Specialty Exam  Presentation  General Appearance:  Appropriate for Environment  Eye Contact: Fair  Speech: Clear and Coherent  Speech Volume: Normal  Handedness: Right   Mood and Affect  Mood: Euthymic  Duration of Depression Symptoms: Greater than two weeks  Affect: Congruent   Thought Process  Thought Processes: Coherent  Descriptions of Associations:Intact  Orientation:Full (Time, Place and Person)  Thought Content:Logical  History of Schizophrenia/Schizoaffective disorder:Yes  Duration of Psychotic Symptoms:Greater than six months  Hallucinations:No data recorded Ideas of Reference:None  Suicidal Thoughts:No data recorded Homicidal Thoughts:No data recorded  Sensorium  Memory: Immediate Fair; Remote Fair  Judgment: Fair  Insight: Present   Executive Functions  Concentration: Fair  Attention Span: Fair  Recall: [employer] of Knowledge: Fair  Language: Fair   Psychomotor Activity  Psychomotor Activity:No data recorded  Assets  Assets: Communication Skills; Financial Resources/Insurance; Housing; Social Support   Sleep  Sleep:No data recorded  Physical Exam: Physical Exam Vitals and nursing note reviewed.  Constitutional:      Appearance: Normal appearance.  HENT:     Head: Normocephalic and atraumatic.     Mouth/Throat:     Pharynx: Oropharynx is clear.  Eyes:     Pupils: Pupils are equal, round, and reactive to light.  Cardiovascular:     Rate and Rhythm: Normal rate and regular rhythm.  Pulmonary:     Effort: Pulmonary effort  is normal.     Breath sounds: Normal breath sounds.  Abdominal:     General: Abdomen is flat.     Palpations: Abdomen is soft.  Musculoskeletal:        General: Normal range of motion.  Skin:    General: Skin is warm and dry.  Neurological:     General: No focal deficit present.     Mental Status: He is alert. Mental status is at baseline.  Psychiatric:        Mood and Affect: Mood normal.        Thought Content: Thought content normal.    Review of Systems  Constitutional: Negative.   HENT: Negative.    Eyes: Negative.   Respiratory: Negative.    Cardiovascular: Negative.   Gastrointestinal: Negative.   Musculoskeletal: Negative.   Skin: Negative.   Neurological: Negative.   Psychiatric/Behavioral: Negative.     Blood pressure 120/65, pulse (!) 122, temperature (!) 97.5 F (36.4 C), temperature source Oral, resp. rate 18, height 6\' 4"  (1.93 m), weight 101.2 kg, SpO2 99 %. Body mass index is 27.14 kg/m.  Mental Status Per Nursing Assessment::   On Admission:  NA  Demographic Factors:  Male, Caucasian, and Living alone  Loss Factors: Decline in physical health  Historical Factors: Impulsivity  Risk Reduction Factors:   Positive therapeutic relationship  Continued Clinical Symptoms:  Schizophrenia:   Depressive state  Cognitive Features That Contribute To Risk:  Thought constriction (tunnel vision)    Suicide Risk:  Minimal: No identifiable suicidal ideation.  Patients presenting with no risk factors but with morbid ruminations; may be classified as minimal risk based on the severity of the depressive symptoms   Follow-up Information     [facility]  [facility] [place] [employer]. Follow up.   Why: Your ACTT services will resume on Friday, 04/09/22. They will be bringing your medications as well. Thanks! Contact information: [address] [place] [address] [phone]                 Plan Of Care/Follow-up recommendations:   Other:  Patient denies suicidal ideation.  Affect euthymic.  Thoughts lucid.  Agrees to continued medication management.  Has ACT team services already arranged  Mordecai Rasmussen, MD 04/09/2022, 10:19 AM
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
## 2022-04-09 NOTE — Progress Notes (Signed)
Patient ID: Derrick Atkins, male   DOB: [birth year], 51 y.o.   MRN: [id number]  Discharge Note:  Patient denies SI/HI/AVH at this time. Discharge instructions, AVS, medications, and transition record gone over with patient. Patient agrees to comply with medication management, follow-up visit, and outpatient therapy. Patient belongings returned to patient. Patient questions and concerns addressed and answered. Patient wheeled off unit. Patient discharged to home via [facility] services.

## 2022-04-11 ENCOUNTER — Emergency Department: Payer: Medicare Other

## 2022-04-11 ENCOUNTER — Emergency Department
Admission: EM | Admit: 2022-04-11 | Discharge: 2022-04-12 | Disposition: A | Discharge status: Home / Self Care | Payer: Medicare Other | Attending: Emergency Medicine | Admitting: Emergency Medicine

## 2022-04-11 ENCOUNTER — Other Ambulatory Visit: Payer: Self-pay

## 2022-04-11 DIAGNOSIS — F200 Paranoid schizophrenia: Secondary | ICD-10-CM | POA: Diagnosis present

## 2022-04-11 DIAGNOSIS — Z91040 Latex allergy status: Secondary | ICD-10-CM | POA: Insufficient documentation

## 2022-04-11 DIAGNOSIS — Z79899 Other long term (current) drug therapy: Secondary | ICD-10-CM | POA: Diagnosis not present

## 2022-04-11 DIAGNOSIS — Y900 Blood alcohol level of less than 20 mg/100 ml: Secondary | ICD-10-CM | POA: Insufficient documentation

## 2022-04-11 DIAGNOSIS — I10 Essential (primary) hypertension: Secondary | ICD-10-CM | POA: Diagnosis not present

## 2022-04-11 DIAGNOSIS — Z91148 Patient's other noncompliance with medication regimen for other reason: Secondary | ICD-10-CM

## 2022-04-11 DIAGNOSIS — Z7982 Long term (current) use of aspirin: Secondary | ICD-10-CM | POA: Diagnosis not present

## 2022-04-11 DIAGNOSIS — E119 Type 2 diabetes mellitus without complications: Secondary | ICD-10-CM | POA: Insufficient documentation

## 2022-04-11 DIAGNOSIS — F2089 Other schizophrenia: Secondary | ICD-10-CM

## 2022-04-11 DIAGNOSIS — Z046 Encounter for general psychiatric examination, requested by authority: Secondary | ICD-10-CM | POA: Diagnosis present

## 2022-04-11 DIAGNOSIS — Z7984 Long term (current) use of oral hypoglycemic drugs: Secondary | ICD-10-CM | POA: Diagnosis not present

## 2022-04-11 LAB — COMPREHENSIVE METABOLIC PANEL
ALT: 28 U/L (ref 0–44)
AST: 38 U/L (ref 15–41)
Albumin: 3.9 g/dL (ref 3.5–5.0)
Alkaline Phosphatase: 69 U/L (ref 38–126)
Anion gap: 8 (ref 5–15)
BUN: 12 mg/dL (ref 6–20)
CO2: 27 mmol/L (ref 22–32)
Calcium: 9.1 mg/dL (ref 8.9–10.3)
Chloride: 102 mmol/L (ref 98–111)
Creatinine, Ser: 0.8 mg/dL (ref 0.61–1.24)
GFR, Estimated: >60 mL/min (ref >60)
Glucose, Bld: 190 mg/dL — ABNORMAL HIGH (ref 70–99)
Potassium: 3.5 mmol/L (ref 3.5–5.1)
Sodium: 137 mmol/L (ref 135–145)
Total Bilirubin: 0.8 mg/dL (ref 0.3–1.2)
Total Protein: 7 g/dL (ref 6.5–8.1)

## 2022-04-11 LAB — ETHANOL: Alcohol, Ethyl (B): <10 mg/dL (ref <10)

## 2022-04-11 LAB — URINE DRUG SCREEN, QUALITATIVE (ARMC ONLY)
Amphetamines, Ur Screen: NOT DETECTED
Barbiturates, Ur Screen: NOT DETECTED
Benzodiazepine, Ur Scrn: NOT DETECTED
Cannabinoid 50 Ng, Ur ~~LOC~~: NOT DETECTED
Cocaine Metabolite,Ur ~~LOC~~: NOT DETECTED
MDMA (Ecstasy)Ur Screen: NOT DETECTED
Methadone Scn, Ur: NOT DETECTED
Opiate, Ur Screen: NOT DETECTED
Phencyclidine (PCP) Ur S: NOT DETECTED
Tricyclic, Ur Screen: NOT DETECTED

## 2022-04-11 LAB — ACETAMINOPHEN LEVEL: Acetaminophen (Tylenol), Serum: <10 ug/mL — ABNORMAL LOW (ref 10–30)

## 2022-04-11 LAB — CBC
HCT: 36.5 % — ABNORMAL LOW (ref 39.0–52.0)
Hemoglobin: 11.8 g/dL — ABNORMAL LOW (ref 13.0–17.0)
MCH: 26.2 pg (ref 26.0–34.0)
MCHC: 32.3 g/dL (ref 30.0–36.0)
MCV: 80.9 fL (ref 80.0–100.0)
Platelets: 120 10*3/uL — ABNORMAL LOW (ref 150–400)
RBC: 4.51 MIL/uL (ref 4.22–5.81)
RDW: 15.2 % (ref 11.5–15.5)
WBC: 3.3 10*3/uL — ABNORMAL LOW (ref 4.0–10.5)
nRBC: 0 % (ref 0.0–0.2)

## 2022-04-11 LAB — SALICYLATE LEVEL: Salicylate Lvl: <7 mg/dL — ABNORMAL LOW (ref 7.0–30.0)

## 2022-04-11 LAB — TROPONIN I (HIGH SENSITIVITY): Troponin I (High Sensitivity): 11 ng/L (ref <18)

## 2022-04-11 MED ORDER — BENZTROPINE MESYLATE 1 MG PO TABS
1.0000 mg | ORAL_TABLET | Freq: Every day | ORAL | Status: DC
  Administered 2022-04-12: 1 mg via ORAL
  Filled 2022-04-11: qty 1

## 2022-04-11 MED ORDER — ASPIRIN 81 MG PO TBEC
81.0000 mg | DELAYED_RELEASE_TABLET | Freq: Every day | ORAL | Status: DC
  Administered 2022-04-12: 81 mg via ORAL
  Filled 2022-04-11: qty 1

## 2022-04-11 MED ORDER — HYDROXYZINE HCL 25 MG PO TABS: 50.0000 mg | ORAL_TABLET | ORAL | Status: DC | PRN

## 2022-04-11 MED ORDER — PIOGLITAZONE HCL 30 MG PO TABS
45.0000 mg | ORAL_TABLET | Freq: Every day | ORAL | Status: DC
  Administered 2022-04-12: 45 mg via ORAL
  Filled 2022-04-11: qty 1

## 2022-04-11 MED ORDER — DIVALPROEX SODIUM 500 MG PO DR TAB
750.0000 mg | DELAYED_RELEASE_TABLET | Freq: Two times a day (BID) | ORAL | Status: DC
  Administered 2022-04-12 (×2): 750 mg via ORAL
  Filled 2022-04-11 (×4): qty 1

## 2022-04-11 MED ORDER — GEMFIBROZIL 600 MG PO TABS: 600.0000 mg | ORAL_TABLET | Freq: Two times a day (BID) | ORAL | Status: DC

## 2022-04-11 MED ORDER — METFORMIN HCL 500 MG PO TABS
1000.0000 mg | ORAL_TABLET | Freq: Two times a day (BID) | ORAL | Status: DC
  Administered 2022-04-12 (×2): 1000 mg via ORAL
  Filled 2022-04-11 (×2): qty 2

## 2022-04-11 MED ORDER — LEVOTHYROXINE SODIUM 88 MCG PO TABS
88.0000 ug | ORAL_TABLET | Freq: Every day | ORAL | Status: DC
  Administered 2022-04-12: 88 ug via ORAL
  Filled 2022-04-11: qty 1

## 2022-04-11 MED ORDER — SIMVASTATIN 10 MG PO TABS
20.0000 mg | ORAL_TABLET | Freq: Every day | ORAL | Status: DC
  Administered 2022-04-12: 20 mg via ORAL
  Filled 2022-04-11 (×3): qty 2

## 2022-04-11 MED ORDER — TRAZODONE HCL 50 MG PO TABS
150.0000 mg | ORAL_TABLET | Freq: Every day | ORAL | Status: DC
  Administered 2022-04-12: 150 mg via ORAL
  Filled 2022-04-11 (×3): qty 1

## 2022-04-11 MED ORDER — OLANZAPINE 10 MG PO TABS
20.0000 mg | ORAL_TABLET | Freq: Every day | ORAL | Status: DC
  Administered 2022-04-12: 20 mg via ORAL
  Filled 2022-04-11 (×3): qty 2

## 2022-04-11 MED ORDER — FENOFIBRATE 54 MG PO TABS
54.0000 mg | ORAL_TABLET | Freq: Every day | ORAL | Status: DC
  Administered 2022-04-12: 54 mg via ORAL
  Filled 2022-04-11: qty 1

## 2022-04-11 MED ORDER — LISINOPRIL 10 MG PO TABS
10.0000 mg | ORAL_TABLET | Freq: Every day | ORAL | Status: DC
  Administered 2022-04-12: 10 mg via ORAL
  Filled 2022-04-11: qty 1

## 2022-04-11 NOTE — ED Notes (Signed)
Patient reports having auditory hallucinations, having si but will not state if he has a plan.  Patient also reports he was supposed to have medications delivery yesterday and did not receive them.

## 2022-04-11 NOTE — ED Notes (Signed)
TTS speaking with patient. 

## 2022-04-11 NOTE — ED Provider Notes (Signed)
[facility] Provider Note    Event Date/Time   First MD Initiated Contact with Patient 04/11/22 2256     (approximate)   History   Suicidal   HPI  Derrick Atkins is a 51 y.o. male who presents to the ED for evaluation of Suicidal   I review psychiatric DC summary from a couple days ago.  Schizophrenic patient who was admitted for agitation and paranoia.  Patient presents to the ED for evaluation of increasing auditory hallucinations that are telling him to cut himself with a knife.  Reports he has not followed through with that yet.  Reports has not been taking his medications.   Physical Exam   Triage Vital Signs: ED Triage Vitals  Enc Vitals Group     BP 04/11/22 2040 (!) 157/78     Pulse Rate 04/11/22 2040 100     Resp 04/11/22 2040 20     Temp 04/11/22 2040 98.5 F (36.9 C)     Temp Source 04/11/22 2040 Oral     SpO2 04/11/22 2040 100 %     Weight 04/11/22 2041 240 lb (108.9 kg)     Height 04/11/22 2041 6\' 4"  (1.93 m)     Head Circumference --      Peak Flow --      Pain Score 04/11/22 2041 10     Pain Loc --      Pain Edu? --      Excl. in [place]? --     Most recent vital signs: Vitals:   04/11/22 2040  BP: (!) 157/78  Pulse: 100  Resp: 20  Temp: 98.5 F (36.9 C)  SpO2: 100%    General: Awake, no distress.  CV:  Good peripheral perfusion.  Resp:  Normal effort.  Abd:  No distention.  MSK:  No deformity noted.  Neuro:  No focal deficits appreciated. Other:     ED Results / Procedures / Treatments   Labs (all labs ordered are listed, but only abnormal results are displayed) Labs Reviewed  COMPREHENSIVE METABOLIC PANEL - Abnormal; Notable for the following components:      Result Value   Glucose, Bld 190 (*)    All other components within normal limits  SALICYLATE LEVEL - Abnormal; Notable for the following components:   Salicylate Lvl <0.6 (*)    All other components within normal limits  ACETAMINOPHEN LEVEL -  Abnormal; Notable for the following components:   Acetaminophen (Tylenol), Serum <10 (*)    All other components within normal limits  CBC - Abnormal; Notable for the following components:   WBC 3.3 (*)    Hemoglobin 11.8 (*)    HCT 36.5 (*)    Platelets 120 (*)    All other components within normal limits  ETHANOL  URINE DRUG SCREEN, QUALITATIVE ([facility] ONLY)  TROPONIN I (HIGH SENSITIVITY)  TROPONIN I (HIGH SENSITIVITY)    EKG Sinus tachycardia the rate of 101 bpm.  Normal axis and intervals.  Nonspecific ST changes inferiorly and laterally without STEMI.  RADIOLOGY CXR interpreted by me without evidence of acute cardiopulmonary pathology.  Official radiology report(s): DG Chest Port 1 View  Result Date: 04/11/2022 CLINICAL DATA:  Altered mental status, hallucinations EXAM: PORTABLE CHEST 1 VIEW COMPARISON:  03/15/2022 FINDINGS: The heart size and mediastinal contours are within normal limits. Both lungs are clear. The visualized skeletal structures are unremarkable. IMPRESSION: No acute abnormality of the lungs in AP portable projection. Electronically Signed   By:  Jearld Lesch M.D.   On: 04/11/2022 21:18    PROCEDURES and INTERVENTIONS:  Procedures  Medications  benztropine (COGENTIN) tablet 1 mg (has no administration in time range)  divalproex (DEPAKOTE) DR tablet 750 mg (750 mg Oral Patient Refused/Not Given 04/12/22 0013)  fenofibrate tablet 54 mg (has no administration in time range)  gemfibrozil (LOPID) tablet 600 mg (600 mg Oral Patient Refused/Not Given 04/12/22 0019)  hydrOXYzine (ATARAX) tablet 50 mg (has no administration in time range)  levothyroxine (SYNTHROID) tablet 88 mcg (has no administration in time range)  lisinopril (ZESTRIL) tablet 10 mg (has no administration in time range)  metFORMIN (GLUCOPHAGE) tablet 1,000 mg (has no administration in time range)  OLANZapine (ZYPREXA) tablet 20 mg (20 mg Oral Patient Refused/Not Given 04/12/22 0014)  pioglitazone  (ACTOS) tablet 45 mg (has no administration in time range)  simvastatin (ZOCOR) tablet 20 mg (20 mg Oral Patient Refused/Not Given 04/12/22 0014)  traZODone (DESYREL) tablet 150 mg (150 mg Oral Patient Refused/Not Given 04/12/22 0014)  aspirin EC tablet 81 mg (has no administration in time range)     IMPRESSION / MDM / ASSESSMENT AND PLAN / ED COURSE  I reviewed the triage vital signs and the nursing notes.  Differential diagnosis includes, but is not limited to, ACS, PE, medication noncompliance, malingering, polysubstance abuse  {Patient presents with symptoms of an acute illness or injury that is potentially life-threatening.  51 year old schizophrenic who was just admitted psychiatrically presents with worsening hallucinations in the setting of medication noncompliance.  We will provide him his medications and hold him voluntarily as we consult psychiatry for evaluation.  While he does note chest pain on his triage intake, he denies this with me.  He is a nonischemic EKG and negative troponin.  I think PE or ACS is unlikely.  Blood work without metabolic derangements, negative troponin and tox screen.      FINAL CLINICAL IMPRESSION(S) / ED DIAGNOSES   Final diagnoses:  Noncompliance with medication regimen  Other schizophrenia (HCC)     Rx / DC Orders   ED Discharge Orders     None        Note:  This document was prepared using Dragon voice recognition software and may include unintentional dictation errors.   Delton Prairie, MD 04/12/22 [phone]

## 2022-04-11 NOTE — ED Triage Notes (Signed)
Arrived via EMS from neighbor's home. EMS state patient was reporting auditory and visual hallucinations and that patient had thoughts of stabbing himself with a knife. Bruising noted to left eye. Patient very reluctant to answer questions. States he also is having chest pain. Alert, resp even, unlabored on RA.

## 2022-04-11 NOTE — ED Notes (Signed)
Patient changed out into paper scrubs with two staff members and security present. Items inventoried: One pair of blue pants, one pair of grey shoes, one blue shirt, one plaid pair of underwear.

## 2022-04-12 DIAGNOSIS — F200 Paranoid schizophrenia: Secondary | ICD-10-CM

## 2022-04-12 LAB — TROPONIN I (HIGH SENSITIVITY): Troponin I (High Sensitivity): 10 ng/L (ref <18)

## 2022-04-12 MED ORDER — GEMFIBROZIL 600 MG PO TABS
600.0000 mg | ORAL_TABLET | Freq: Two times a day (BID) | ORAL | Status: DC
  Administered 2022-04-12: 600 mg via ORAL
  Filled 2022-04-12: qty 1

## 2022-04-12 NOTE — ED Notes (Signed)
Patient given breakfast tray.

## 2022-04-12 NOTE — ED Notes (Signed)
Patient ambulated to bathroom x2. Patient wiped down with bath wipes. Changed patient's scrubs and bedding. Patient returned to bed.

## 2022-04-12 NOTE — ED Notes (Signed)
Called [place] cab at [phone] to request pickup for pt with voucher. Cab en route.

## 2022-04-12 NOTE — ED Notes (Addendum)
Pt assisted to bathroom with 2 staff assist. This RN and EDT cleaned pt and pt placed in clean scrubs. Pt assisted back to bed at this time. Healing bruise noted to pt's right inner thigh.

## 2022-04-12 NOTE — Consult Note (Signed)
[facility] Consult   Reason for Consult:  paranoia Referring Physician:  EDP Patient Identification: Derrick Atkins MRN:  [id number] Principal Diagnosis: Schizophrenia, paranoid, chronic ([place]) Diagnosis:  Principal Problem:   Schizophrenia, paranoid, chronic ([place])   Total Time spent with patient: 30 minutes  Subjective: "I was not feeling safe at home." Derrick Atkins is a 51 y.o. male patient admitted with not feeling safe at home, [facility].  HPI: Patient is well known to this facility.  He was discharged from the inpatient unit on 04/09/2022.  He states that he has not been taking his medicine since he left.  He reports that the ACT team did not come and see him on Saturday, and he did not have any medications.  He says that he does not have her phone and he "got scared."  This prompted him to go to a neighbor's house, who called EMS.  Patient has been sleeping comfortably in the ED today.  There have been no incidents of behavioral outbursts or expression of suicidal thoughts.  On evaluation, patient is calm and cooperative.  He is speaking in linear sentences, appears to be at his baseline or very close, stating that he gets "scared sometimes.  He denies any current thoughts of suicidal ideation.  He states that he feels safe here.  He has been taking his medications as prescribed.  He does say that his guardian, Derrick Atkins, is working on different placement for him.  I spoke to his guardian, Derrick Atkins, [phone]: She  is a family member, 50 years old, and she states that she cannot be his guardian anymore.  She has a court date on October 13 to relinquish guardianship.  She reports that Derrick Atkins has "plenty of medication at home."  She states that "he is doing this to punish his family."  She has no concerns about his safety.  Derrick Atkins ACT team was going to come out today.  She states that he broke his phone that [facility] gave him and she went out and got him a Trac  phone herself, but he refuses to spend the money to get the  minute card. She does not have money to continue to pay for minutes on his phone.   [employer] [phone]: Writer spoke with receptionist, Thayer Headings was not available.  Informed them that patient will be discharged and asked that they follow up with him tomorrow.  Past Psychiatric History: Paranoid schizophrenia  Risk to Self:   Risk to Others:   Prior Inpatient Therapy:   Prior Outpatient Therapy:    Past Medical History:  Past Medical History:  Diagnosis Date   Cerebral palsy ([place])    Depression    Diabetes mellitus without complication ([place])    Hypertension    Kidney stones    Schizoaffective disorder ([place])    Scoliosis     Past Surgical History:  Procedure Laterality Date   BOWEL RESECTION     LAPAROTOMY N/A 05/15/2020   Procedure: EXPLORATORY LAPAROTOMY;  Surgeon: Olean Ree, MD;  Location: [facility] ORS;  Service: General;  Laterality: N/A;   Family History:  Family History  Problem Relation Age of Onset   Heart disease Father    Heart attack Father    Family Psychiatric  History:  Social History:  Social History   Substance and Sexual Activity  Alcohol Use No     Social History   Substance and Sexual Activity  Drug Use No    Social History  Socioeconomic History   Marital status: Single    Spouse name: Not on file   Number of children: Not on file   Years of education: Not on file   Highest education level: Not on file  Occupational History   Not on file  Tobacco Use   Smoking status: Never   Smokeless tobacco: Never  Vaping Use   Vaping Use: Never used  Substance and Sexual Activity   Alcohol use: No   Drug use: No   Sexual activity: Not Currently  Other Topics Concern   Not on file  Social History Narrative   Not on file   Social Determinants of Health   Financial Resource Strain: Not on file  Food Insecurity: Food Insecurity Present (04/09/2022)   Hunger Vital Sign    Worried  About Running Out of Food in the Last Year: Often true    Ran Out of Food in the Last Year: Often true  Transportation Needs: Unmet Transportation Needs (04/09/2022)   PRAPARE - Hydrologist (Medical): Yes    Lack of Transportation (Non-Medical): Yes  Physical Activity: Not on file  Stress: Not on file  Social Connections: Not on file   Additional Social History:    Allergies:   Allergies  Allergen Reactions   Phenytoin Sodium Extended Other (See Comments) and Nausea And Vomiting   Prednisone Other (See Comments)   Latex Hives, Nausea And Vomiting and Rash    Labs:  Results for orders placed or performed during the hospital encounter of 04/11/22 (from the past 48 hour(s))  Comprehensive metabolic panel     Status: Abnormal   Collection Time: 04/11/22  8:49 PM  Result Value Ref Range   Sodium 137 135 - 145 mmol/L   Potassium 3.5 3.5 - 5.1 mmol/L   Chloride 102 98 - 111 mmol/L   CO2 27 22 - 32 mmol/L   Glucose, Bld 190 (H) 70 - 99 mg/dL    Comment: Glucose reference range applies only to samples taken after fasting for at least 8 hours.   BUN 12 6 - 20 mg/dL   Creatinine, Ser 0.80 0.61 - 1.24 mg/dL   Calcium 9.1 8.9 - 10.3 mg/dL   Total Protein 7.0 6.5 - 8.1 g/dL   Albumin 3.9 3.5 - 5.0 g/dL   AST 38 15 - 41 U/L   ALT 28 0 - 44 U/L   Alkaline Phosphatase 69 38 - 126 U/L   Total Bilirubin 0.8 0.3 - 1.2 mg/dL   GFR, Estimated >60 >60 mL/min    Comment: (NOTE) Calculated using the CKD-EPI Creatinine Equation (2021)    Anion gap 8 5 - 15    Comment: Performed at [facility], [address] [employer] [place], [place] [address]  Ethanol     Status: None   Collection Time: 04/11/22  8:49 PM  Result Value Ref Range   Alcohol, Ethyl (B) <10 <10 mg/dL    Comment: (NOTE) Lowest detectable limit for serum alcohol is 10 mg/dL.  For medical purposes only. Performed at [facility], [address] [place], [place] [id number]   Salicylate  level     Status: Abnormal   Collection Time: 04/11/22  8:49 PM  Result Value Ref Range   Salicylate Lvl Q000111Q (L) 7.0 - 30.0 mg/dL    Comment: Performed at [facility], [address] [place], [place] [address]  Acetaminophen level     Status: Abnormal   Collection Time: 04/11/22  8:49 PM  Result Value Ref Range   Acetaminophen (Tylenol), Serum <10 (L) 10 - 30 ug/mL    Comment: (NOTE) Therapeutic concentrations vary significantly. A range of 10-30 ug/mL  may be an effective concentration for many patients. However, some  are best treated at concentrations outside of this range. Acetaminophen concentrations >150 ug/mL at 4 hours after ingestion  and >50 ug/mL at 12 hours after ingestion are often associated with  toxic reactions.  Performed at [facility], [address] [place], [place] [address]   cbc     Status: Abnormal   Collection Time: 04/11/22  8:49 PM  Result Value Ref Range   WBC 3.3 (L) 4.0 - 10.5 K/uL   RBC 4.51 4.22 - 5.81 MIL/uL   Hemoglobin 11.8 (L) 13.0 - 17.0 g/dL   HCT 36.5 (L) 39.0 - 52.0 %   MCV 80.9 80.0 - 100.0 fL   MCH 26.2 26.0 - 34.0 pg   MCHC 32.3 30.0 - 36.0 g/dL   RDW 15.2 11.5 - 15.5 %   Platelets 120 (L) 150 - 400 K/uL   nRBC 0.0 0.0 - 0.2 %    Comment: Performed at [facility], [address] [place], [place] [address]  Troponin I (High Sensitivity)     Status: None   Collection Time: 04/11/22  8:49 PM  Result Value Ref Range   Troponin I (High Sensitivity) 11 <18 ng/L    Comment: (NOTE) Elevated high sensitivity troponin I (hsTnI) values and significant  changes across serial measurements may suggest ACS but many other  chronic and acute conditions are known to elevate hsTnI results.  Refer to the "Links" section for chest pain algorithms and additional  guidance. Performed at [facility], [address] [place], [place] [address]   Urine Drug Screen, Qualitative     Status: None    Collection Time: 04/11/22  9:10 PM  Result Value Ref Range   Tricyclic, Ur Screen NONE DETECTED NONE DETECTED   Amphetamines, Ur Screen NONE DETECTED NONE DETECTED   MDMA (Ecstasy)Ur Screen NONE DETECTED NONE DETECTED   Cocaine Metabolite,Ur [place] NONE DETECTED NONE DETECTED   Opiate, Ur Screen NONE DETECTED NONE DETECTED   Phencyclidine (PCP) Ur S NONE DETECTED NONE DETECTED   Cannabinoid 50 Ng, Ur [place] NONE DETECTED NONE DETECTED   Barbiturates, Ur Screen NONE DETECTED NONE DETECTED   Benzodiazepine, Ur Scrn NONE DETECTED NONE DETECTED   Methadone Scn, Ur NONE DETECTED NONE DETECTED    Comment: (NOTE) Tricyclics + metabolites, urine    Cutoff 1000 ng/mL Amphetamines + metabolites, urine  Cutoff 1000 ng/mL MDMA (Ecstasy), urine              Cutoff 500 ng/mL Cocaine Metabolite, urine          Cutoff 300 ng/mL Opiate + metabolites, urine        Cutoff 300 ng/mL Phencyclidine (PCP), urine         Cutoff 25 ng/mL Cannabinoid, urine                 Cutoff 50 ng/mL Barbiturates + metabolites, urine  Cutoff 200 ng/mL Benzodiazepine, urine              Cutoff 200 ng/mL Methadone, urine                   Cutoff 300 ng/mL  The urine drug screen provides only a preliminary, unconfirmed analytical test result and should not be used  for non-medical purposes. Clinical consideration and professional judgment should be applied to any positive drug screen result due to possible interfering substances. A more specific alternate chemical method must be used in order to obtain a confirmed analytical result. Gas chromatography / mass spectrometry (GC/MS) is the preferred confirm atory method. Performed at [facility], [address] [place], [place] [address]   Troponin I (High Sensitivity)     Status: None   Collection Time: 04/12/22  1:52 AM  Result Value Ref Range   Troponin I (High Sensitivity) 10 <18 ng/L    Comment: (NOTE) Elevated high sensitivity troponin I (hsTnI) values and  significant  changes across serial measurements may suggest ACS but many other  chronic and acute conditions are known to elevate hsTnI results.  Refer to the "Links" section for chest pain algorithms and additional  guidance. Performed at [facility], [address] [place], [place] [address]     Current Facility-Administered Medications  Medication Dose Route Frequency Provider Last Rate Last Admin   aspirin EC tablet 81 mg  81 mg Oral Daily Delton Prairie, MD   81 mg at 04/12/22 1025   benztropine (COGENTIN) tablet 1 mg  1 mg Oral Daily Delton Prairie, MD   1 mg at 04/12/22 1025   divalproex (DEPAKOTE) DR tablet 750 mg  750 mg Oral Q12H Delton Prairie, MD   750 mg at 04/12/22 1025   fenofibrate tablet 54 mg  54 mg Oral Daily Delton Prairie, MD   54 mg at 04/12/22 1025   gemfibrozil (LOPID) tablet 600 mg  600 mg Oral BID Delton Prairie, MD   600 mg at 04/12/22 1025   hydrOXYzine (ATARAX) tablet 50 mg  50 mg Oral Q4H PRN Delton Prairie, MD       levothyroxine (SYNTHROID) tablet 88 mcg  88 mcg Oral Q0600 Delton Prairie, MD   88 mcg at 04/12/22 1025   lisinopril (ZESTRIL) tablet 10 mg  10 mg Oral Daily Delton Prairie, MD   10 mg at 04/12/22 1025   metFORMIN (GLUCOPHAGE) tablet 1,000 mg  1,000 mg Oral BID WC Delton Prairie, MD   1,000 mg at 04/12/22 1025   OLANZapine (ZYPREXA) tablet 20 mg  20 mg Oral QHS Delton Prairie, MD   20 mg at 04/12/22 0130   pioglitazone (ACTOS) tablet 45 mg  45 mg Oral Daily Delton Prairie, MD   45 mg at 04/12/22 1026   simvastatin (ZOCOR) tablet 20 mg  20 mg Oral QHS Delton Prairie, MD   20 mg at 04/12/22 0128   traZODone (DESYREL) tablet 150 mg  150 mg Oral QHS Delton Prairie, MD   150 mg at 04/12/22 0130   Current Outpatient Medications  Medication Sig Dispense Refill   [START ON 04/21/2022] ARIPiprazole ER (ABILIFY MAINTENA) 400 MG SRER injection Inject 2 mLs (400 mg total) into the muscle every 28 (twenty-eight) days. 1 each 1   aspirin EC 81 MG tablet Take 1 tablet (81  mg total) by mouth daily. Swallow whole. 30 tablet 12   benztropine (COGENTIN) 1 MG tablet Take 1 tablet (1 mg total) by mouth daily. 30 tablet 1   divalproex (DEPAKOTE) 250 MG DR tablet Take 3 tablets (750 mg total) by mouth every 12 (twelve) hours. 180 tablet 1   fenofibrate 54 MG tablet Take 1 tablet (54 mg total) by mouth daily. 30 tablet 1   gemfibrozil (LOPID) 600 MG tablet Take 1 tablet (600 mg total) by mouth 2 (two)  times daily. 60 tablet 1   hydrOXYzine (ATARAX) 50 MG tablet Take 1 tablet (50 mg total) by mouth every 4 (four) hours as needed for anxiety. 30 tablet 1   levothyroxine (SYNTHROID) 88 MCG tablet Take 1 tablet (88 mcg total) by mouth daily at 6 (six) AM. 30 tablet 1   lisinopril (ZESTRIL) 10 MG tablet Take 1 tablet (10 mg total) by mouth daily. 30 tablet 1   metFORMIN (GLUCOPHAGE) 1000 MG tablet Take 1 tablet (1,000 mg total) by mouth 2 (two) times daily with a meal. 60 tablet 1   OLANZapine (ZYPREXA) 20 MG tablet Take 1 tablet (20 mg total) by mouth at bedtime. 30 tablet 1   pioglitazone (ACTOS) 45 MG tablet Take 1 tablet (45 mg total) by mouth daily. 30 tablet 1   simvastatin (ZOCOR) 20 MG tablet Take 1 tablet (20 mg total) by mouth at bedtime. 30 tablet 1   traZODone (DESYREL) 150 MG tablet Take 1 tablet (150 mg total) by mouth at bedtime. 30 tablet 1    Musculoskeletal: Strength & Muscle Tone: within normal limits Gait & Station: normal Patient leans: N/A            Psychiatric Specialty Exam:  Presentation  General Appearance:  Casual  Eye Contact: Good  Speech: Clear and Coherent  Speech Volume: Normal  Handedness: Atkins   Mood and Affect  Mood: Euthymic (Baseline)  Affect: Blunt (At baseline)   Thought Process  Thought Processes: Goal Directed  Descriptions of Associations:Intact  Orientation:Full (Time, Place and Person)  Thought Content:Perseveration  History of Schizophrenia/Schizoaffective disorder:Yes  Duration of  Psychotic Symptoms:Greater than six months  Hallucinations:Hallucinations: None  Ideas of Reference:None  Suicidal Thoughts:Suicidal Thoughts: No  Homicidal Thoughts:Homicidal Thoughts: No   Sensorium  Memory: Immediate Fair  Judgment: Impaired  Insight: Poor   Executive Functions  Concentration: Poor  Attention Span: Fair  Recall: [employer] of Knowledge: Fair  Language: Fair   Psychomotor Activity  Psychomotor Activity:Psychomotor Activity: Normal   Assets  Assets: Resilience; Social Support; Catering manager; Housing   Sleep  Sleep:Sleep: Good   Physical Exam: Physical Exam Vitals and nursing note reviewed.  HENT:     Head: Normocephalic.     Nose: No congestion or rhinorrhea.  Eyes:     General:        Atkins eye: No discharge.        Left eye: No discharge.  Cardiovascular:     Rate and Rhythm: Normal rate.  Pulmonary:     Effort: Pulmonary effort is normal.  Musculoskeletal:        General: Normal range of motion.     Cervical back: Normal range of motion.  Skin:    General: Skin is dry.  Neurological:     Mental Status: He is alert and oriented to person, place, and time.  Psychiatric:        Attention and Perception: Attention normal.        Mood and Affect: Affect is blunt (at baseline).        Speech: Speech normal.    Review of Systems  HENT: Negative.    Respiratory: Negative.    Musculoskeletal: Negative.   Psychiatric/Behavioral:  Negative for hallucinations, memory loss, substance abuse and suicidal ideas. The patient is not nervous/anxious and does not have insomnia.        Paranoid schizophrenia, appears to be at baseline   Blood pressure 122/65, pulse 98, temperature 98.3 F (36.8 C), temperature source  Oral, resp. rate 16, height 6\' 4"  (1.93 m), weight 108.9 kg, SpO2 99 %. Body mass index is 29.21 kg/m.  Treatment Plan Summary: Plan discharge home for follow-up with his ACT team.  Disposition:  No evidence of imminent risk to self or others at present.   Patient does not meet criteria for psychiatric inpatient admission. Discussed crisis plan, support from social network, calling 911, coming to the Emergency Department, and calling Suicide Hotline.  [name], NP 04/12/2022 4:14 PM

## 2022-04-12 NOTE — Progress Notes (Signed)
   04/12/22 1600  Clinical Encounter Type  Visited With Patient  Visit Type Follow-up  Referral From Chaplain  Consult/Referral To [facility] followed up with patient. Patient was sleepy and mumbling. Chaplain services are available for follow up as needed.

## 2022-04-12 NOTE — BH Assessment (Signed)
This spoke with pt's legal guardian Denman George [phone] to notify her of pt's arrival and disposition. [name] verbalized an understanding of the information discussed.

## 2022-04-12 NOTE — ED Notes (Signed)
Per psych NP, pt does not currently meet inpatient criteria and is appropriate for discharge.
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
## 2022-06-07 ENCOUNTER — Emergency Department
Admission: EM | Admit: 2022-06-07 | Discharge: 2022-06-08 | Disposition: A | Discharge status: Home / Self Care | Payer: Medicare Other | Attending: Emergency Medicine | Admitting: Emergency Medicine

## 2022-06-07 ENCOUNTER — Encounter: Payer: Self-pay | Admitting: Radiology

## 2022-06-07 ENCOUNTER — Other Ambulatory Visit: Payer: Self-pay

## 2022-06-07 DIAGNOSIS — F200 Paranoid schizophrenia: Secondary | ICD-10-CM | POA: Diagnosis not present

## 2022-06-07 DIAGNOSIS — R443 Hallucinations, unspecified: Secondary | ICD-10-CM

## 2022-06-07 DIAGNOSIS — F419 Anxiety disorder, unspecified: Secondary | ICD-10-CM | POA: Diagnosis not present

## 2022-06-07 DIAGNOSIS — E119 Type 2 diabetes mellitus without complications: Secondary | ICD-10-CM | POA: Insufficient documentation

## 2022-06-07 DIAGNOSIS — I10 Essential (primary) hypertension: Secondary | ICD-10-CM | POA: Insufficient documentation

## 2022-06-07 DIAGNOSIS — R440 Auditory hallucinations: Secondary | ICD-10-CM | POA: Diagnosis present

## 2022-06-07 LAB — CBC
HCT: 35 % — ABNORMAL LOW (ref 39.0–52.0)
Hemoglobin: 11.5 g/dL — ABNORMAL LOW (ref 13.0–17.0)
MCH: 27.4 pg (ref 26.0–34.0)
MCHC: 32.9 g/dL (ref 30.0–36.0)
MCV: 83.5 fL (ref 80.0–100.0)
Platelets: 139 10*3/uL — ABNORMAL LOW (ref 150–400)
RBC: 4.19 MIL/uL — ABNORMAL LOW (ref 4.22–5.81)
RDW: 16.9 % — ABNORMAL HIGH (ref 11.5–15.5)
WBC: 5.3 10*3/uL (ref 4.0–10.5)
nRBC: 0 % (ref 0.0–0.2)

## 2022-06-07 LAB — COMPREHENSIVE METABOLIC PANEL
ALT: 15 U/L (ref 0–44)
AST: 19 U/L (ref 15–41)
Albumin: 3.7 g/dL (ref 3.5–5.0)
Alkaline Phosphatase: 72 U/L (ref 38–126)
Anion gap: 4 — ABNORMAL LOW (ref 5–15)
BUN: 20 mg/dL (ref 6–20)
CO2: 25 mmol/L (ref 22–32)
Calcium: 9.1 mg/dL (ref 8.9–10.3)
Chloride: 110 mmol/L (ref 98–111)
Creatinine, Ser: 0.74 mg/dL (ref 0.61–1.24)
GFR, Estimated: >60 mL/min (ref >60)
Glucose, Bld: 136 mg/dL — ABNORMAL HIGH (ref 70–99)
Potassium: 5 mmol/L (ref 3.5–5.1)
Sodium: 139 mmol/L (ref 135–145)
Total Bilirubin: 0.6 mg/dL (ref 0.3–1.2)
Total Protein: 6.6 g/dL (ref 6.5–8.1)

## 2022-06-07 LAB — URINE DRUG SCREEN, QUALITATIVE (ARMC ONLY)
Amphetamines, Ur Screen: NOT DETECTED
Barbiturates, Ur Screen: NOT DETECTED
Benzodiazepine, Ur Scrn: NOT DETECTED
Cannabinoid 50 Ng, Ur ~~LOC~~: NOT DETECTED
Cocaine Metabolite,Ur ~~LOC~~: NOT DETECTED
MDMA (Ecstasy)Ur Screen: NOT DETECTED
Methadone Scn, Ur: NOT DETECTED
Opiate, Ur Screen: NOT DETECTED
Phencyclidine (PCP) Ur S: NOT DETECTED
Tricyclic, Ur Screen: POSITIVE — AB

## 2022-06-07 LAB — SALICYLATE LEVEL: Salicylate Lvl: <7 mg/dL — ABNORMAL LOW (ref 7.0–30.0)

## 2022-06-07 LAB — ETHANOL: Alcohol, Ethyl (B): <10 mg/dL (ref <10)

## 2022-06-07 LAB — CBG MONITORING, ED: Glucose-Capillary: 203 mg/dL — ABNORMAL HIGH (ref 70–99)

## 2022-06-07 LAB — ACETAMINOPHEN LEVEL: Acetaminophen (Tylenol), Serum: <10 ug/mL — ABNORMAL LOW (ref 10–30)

## 2022-06-07 LAB — VALPROIC ACID LEVEL: Valproic Acid Lvl: 20 ug/mL — ABNORMAL LOW (ref 50.0–100.0)

## 2022-06-07 MED ORDER — FENOFIBRATE 54 MG PO TABS
54.0000 mg | ORAL_TABLET | Freq: Every day | ORAL | Status: DC
  Administered 2022-06-08: 54 mg via ORAL
  Filled 2022-06-07: qty 1

## 2022-06-07 MED ORDER — GEMFIBROZIL 600 MG PO TABS
600.0000 mg | ORAL_TABLET | Freq: Two times a day (BID) | ORAL | Status: DC
  Administered 2022-06-07 – 2022-06-08 (×2): 600 mg via ORAL
  Filled 2022-06-07 (×2): qty 1

## 2022-06-07 MED ORDER — PIOGLITAZONE HCL 30 MG PO TABS
45.0000 mg | ORAL_TABLET | Freq: Every day | ORAL | Status: DC
  Administered 2022-06-07 – 2022-06-08 (×2): 45 mg via ORAL
  Filled 2022-06-07 (×2): qty 1

## 2022-06-07 MED ORDER — ASPIRIN 81 MG PO TBEC
81.0000 mg | DELAYED_RELEASE_TABLET | Freq: Every day | ORAL | Status: DC
  Administered 2022-06-07 – 2022-06-08 (×2): 81 mg via ORAL
  Filled 2022-06-07 (×2): qty 1

## 2022-06-07 MED ORDER — LISINOPRIL 10 MG PO TABS
10.0000 mg | ORAL_TABLET | Freq: Every day | ORAL | Status: DC
  Administered 2022-06-07 – 2022-06-08 (×2): 10 mg via ORAL
  Filled 2022-06-07 (×2): qty 1

## 2022-06-07 MED ORDER — HYDROXYZINE HCL 25 MG PO TABS
50.0000 mg | ORAL_TABLET | ORAL | Status: DC | PRN
  Administered 2022-06-07: 50 mg via ORAL
  Filled 2022-06-07: qty 2

## 2022-06-07 MED ORDER — TRAZODONE HCL 50 MG PO TABS
150.0000 mg | ORAL_TABLET | Freq: Every day | ORAL | Status: DC
  Administered 2022-06-07: 150 mg via ORAL
  Filled 2022-06-07: qty 1

## 2022-06-07 MED ORDER — DIVALPROEX SODIUM 500 MG PO DR TAB
750.0000 mg | DELAYED_RELEASE_TABLET | Freq: Two times a day (BID) | ORAL | Status: DC
  Administered 2022-06-07 – 2022-06-08 (×2): 750 mg via ORAL
  Filled 2022-06-07 (×3): qty 1

## 2022-06-07 MED ORDER — METFORMIN HCL 500 MG PO TABS
1000.0000 mg | ORAL_TABLET | Freq: Two times a day (BID) | ORAL | Status: DC
  Administered 2022-06-07 – 2022-06-08 (×2): 1000 mg via ORAL
  Filled 2022-06-07 (×2): qty 2

## 2022-06-07 MED ORDER — BENZTROPINE MESYLATE 1 MG PO TABS
1.0000 mg | ORAL_TABLET | Freq: Every day | ORAL | Status: DC
  Administered 2022-06-07 – 2022-06-08 (×2): 1 mg via ORAL
  Filled 2022-06-07 (×2): qty 1

## 2022-06-07 MED ORDER — SIMVASTATIN 10 MG PO TABS
20.0000 mg | ORAL_TABLET | Freq: Every day | ORAL | Status: DC
  Administered 2022-06-07: 20 mg via ORAL
  Filled 2022-06-07: qty 2

## 2022-06-07 MED ORDER — LEVOTHYROXINE SODIUM 88 MCG PO TABS
88.0000 ug | ORAL_TABLET | Freq: Every day | ORAL | Status: DC
  Administered 2022-06-08: 88 ug via ORAL
  Filled 2022-06-07: qty 1

## 2022-06-07 MED ORDER — OLANZAPINE 10 MG PO TABS
20.0000 mg | ORAL_TABLET | Freq: Every day | ORAL | Status: DC
  Administered 2022-06-07: 20 mg via ORAL
  Filled 2022-06-07: qty 2

## 2022-06-07 NOTE — ED Notes (Signed)
Patient is resting quietly sitting on side of bed. Patient continues to wait for transport. DSS has not responded to voice mails or pages.

## 2022-06-07 NOTE — ED Notes (Signed)
Patient was given hydroxyzine per request for anxiety.

## 2022-06-07 NOTE — ED Provider Triage Note (Signed)
Emergency Medicine Provider Triage Evaluation Note  Derrick Atkins , a 51 y.o. male  was evaluated in triage.  Pt complains of anxiety regarding being alone. Working with [employer] to find a group home. He denies SI or HI.  Physical Exam  There were no vitals taken for this visit. Gen:   Awake, no distress   Resp:  Normal effort  MSK:   Moves extremities without difficulty  Other:    Medical Decision Making  Medically screening exam initiated at 1:21 PM.  Appropriate orders placed.  Derrick Atkins was informed that the remainder of the evaluation will be completed by another provider, this initial triage assessment does not replace that evaluation, and the importance of remaining in the ED until their evaluation is complete.    Derrick Pester, FNP 06/07/22 1322

## 2022-06-07 NOTE — ED Provider Notes (Signed)
[facility] Provider Note    Event Date/Time   First MD Initiated Contact with Patient 06/07/22 1339     (approximate)   History   Chief Complaint Anxiety   HPI  Derrick Atkins is a 51 y.o. male with past medical history of hypertension, diabetes, cerebral palsy, and schizoaffective disorder who presents to the ED complaining of anxiety.  Patient reports that he has been feeling increasingly anxious with auditory hallucinations for the past few days.  He states that this is due to his current living situation as he lives by himself and is attempting to get into a group home.  He states he has a Child psychotherapist helping him with this but it causes him great anxiety.  He denies any suicidal homicidal ideation.  He denies any alcohol or drug use, also denies any medical complaints.  He does admit that he has not taken his regular medications yet today.     Physical Exam   Triage Vital Signs: ED Triage Vitals  Enc Vitals Group     BP 06/07/22 1321 (!) 146/78     Pulse Rate 06/07/22 1321 87     Resp 06/07/22 1321 19     Temp 06/07/22 1321 97.6 F (36.4 C)     Temp Source 06/07/22 1321 Oral     SpO2 06/07/22 1321 100 %     Weight 06/07/22 1322 (!) 540 lb (244.9 kg)     Height 06/07/22 1322 6\' 3"  (1.905 m)     Head Circumference --      Peak Flow --      Pain Score --      Pain Loc --      Pain Edu? --      Excl. in GC? --     Most recent vital signs: Vitals:   06/07/22 1321  BP: (!) 146/78  Pulse: 87  Resp: 19  Temp: 97.6 F (36.4 C)  SpO2: 100%    Constitutional: Alert and oriented. Eyes: Conjunctivae are normal. Head: Atraumatic. Nose: No congestion/rhinnorhea. Mouth/Throat: Mucous membranes are moist.  Cardiovascular: Normal rate, regular rhythm. Grossly normal heart sounds.  2+ radial pulses bilaterally. Respiratory: Normal respiratory effort.  No retractions. Lungs CTAB. Gastrointestinal: Soft and nontender. No  distention. Musculoskeletal: No lower extremity tenderness nor edema.  Neurologic:  Normal speech and language. No gross focal neurologic deficits are appreciated.    ED Results / Procedures / Treatments   Labs (all labs ordered are listed, but only abnormal results are displayed) Labs Reviewed  COMPREHENSIVE METABOLIC PANEL - Abnormal; Notable for the following components:      Result Value   Glucose, Bld 136 (*)    Anion gap 4 (*)    All other components within normal limits  SALICYLATE LEVEL - Abnormal; Notable for the following components:   Salicylate Lvl <7.0 (*)    All other components within normal limits  ACETAMINOPHEN LEVEL - Abnormal; Notable for the following components:   Acetaminophen (Tylenol), Serum <10 (*)    All other components within normal limits  CBC - Abnormal; Notable for the following components:   RBC 4.19 (*)    Hemoglobin 11.5 (*)    HCT 35.0 (*)    RDW 16.9 (*)    Platelets 139 (*)    All other components within normal limits  URINE DRUG SCREEN, QUALITATIVE ([facility] ONLY) - Abnormal; Notable for the following components:   Tricyclic, Ur Screen POSITIVE (*)    All  other components within normal limits  VALPROIC ACID LEVEL - Abnormal; Notable for the following components:   Valproic Acid Lvl 20 (*)    All other components within normal limits  ETHANOL    PROCEDURES:  Critical Care performed: No  Procedures   MEDICATIONS ORDERED IN ED: Medications  aspirin EC tablet 81 mg (has no administration in time range)  benztropine (COGENTIN) tablet 1 mg (has no administration in time range)  divalproex (DEPAKOTE) DR tablet 750 mg (has no administration in time range)  fenofibrate tablet 54 mg (has no administration in time range)  gemfibrozil (LOPID) tablet 600 mg (has no administration in time range)  hydrOXYzine (ATARAX) tablet 50 mg (has no administration in time range)  levothyroxine (SYNTHROID) tablet 88 mcg (has no administration in time range)   lisinopril (ZESTRIL) tablet 10 mg (has no administration in time range)  metFORMIN (GLUCOPHAGE) tablet 1,000 mg (has no administration in time range)  OLANZapine (ZYPREXA) tablet 20 mg (has no administration in time range)  pioglitazone (ACTOS) tablet 45 mg (has no administration in time range)  simvastatin (ZOCOR) tablet 20 mg (has no administration in time range)  traZODone (DESYREL) tablet 150 mg (has no administration in time range)     IMPRESSION / MDM / ASSESSMENT AND PLAN / ED COURSE  I reviewed the triage vital signs and the nursing notes.                              51 y.o. male with past medical history of hypertension, diabetes, cerebral palsy, and schizoaffective disorder who presents to the ED complaining of increasing anxiety and auditory hallucinations with no suicidal or homicidal ideation.  Patient's presentation is most consistent with acute presentation with potential threat to life or bodily function.  Differential diagnosis includes, but is not limited to, anxiety, depression, psychosis, medication noncompliance.  Patient nontoxic-appearing and in no acute distress, vital signs are unremarkable.  He reports significant anxiety and hallucinations, but does not appear to be a threat to himself or others at this time and we will maintain voluntary status.  He denies any medical complaints and screening labs are unremarkable with no significant anemia, leukocytosis, electrolyte abnormality, or AKI.  Depakote level is subtherapeutic and we will restart patient's home medications.  He may be medically cleared for psychiatric disposition.  Psychiatry consult is pending at this time.  The patient has been placed in psychiatric observation due to the need to provide a safe environment for the patient while obtaining psychiatric consultation and evaluation, as well as ongoing medical and medication management to treat the patient's condition.  The patient has not been placed under  full IVC at this time.      FINAL CLINICAL IMPRESSION(S) / ED DIAGNOSES   Final diagnoses:  Anxiety  Hallucinations     Rx / DC Orders   ED Discharge Orders     None        Note:  This document was prepared using Dragon voice recognition software and may include unintentional dictation errors.   Chesley Noon, MD 06/07/22 [phone]

## 2022-06-07 NOTE — ED Notes (Signed)
This Clinical research associate spoke with Sallye Ober NP, who states patient is discharged and to give medications. DSS will have to be contacted for transport.

## 2022-06-07 NOTE — Consult Note (Signed)
[facility] Face-to-Face Psychiatry Consult   Reason for Consult:  Anxiety Referring Physician:  Larinda Buttery Patient Identification: Derrick Atkins MRN:  [id number] Principal Diagnosis: Schizophrenia, paranoid, chronic (HCC) Diagnosis:  Principal Problem:   Schizophrenia, paranoid, chronic (HCC)   Total Time spent with patient: 30 minutes  Subjective:"I guess I shouldn't call 911 just for someone to talk to."   Derrick Atkins is a 51 y.o. male patient admitted with patient called EMS because he "needed someone to talk to."   HPI:  Patient reports that his ACT team, [place], is making progress and getting him into a group home.  However, he says that he is taking so long that he is getting anxious.  He states that he does not have anybody to talk to and he is looking forward to being in a group home.  Patient was provided understanding, with empathetic listening.  Patient denies any suicidal thoughts.  Denies auditory or visual hallucinations.  Patient presents as calm and cooperative, mildly anxious, actually slightly improved from his usual baseline.  Patient states that he probably should not call 911 because he wants somebody to talk to.  Discussed reaching out to his ACT team for peer support.  Patient is speaking in linear sentences with goal directed thoughts and behavior.  Patient talks about losing both of his parents and not seeing his family very much.   Writer verified with Liborio Nixon at  [facility], patient received LAI on 05/19/22. She also verified that they do have a group home for him, but there are just some steps that need to be taken for the process to be complete. She states patient is doing better overall.   Patient reports that he is taking his medication as directed, the ACT team from [facility] brings it weekly.  He says that he got his long-acting injectable last week.  Patient does not meet criteria for inpatient psychiatric hospitalization at this time.  Past Psychiatric  History: Schizophrenia  Risk to Self:   Risk to Others:   Prior Inpatient Therapy:   Prior Outpatient Therapy:    Past Medical History:  Past Medical History:  Diagnosis Date   Cerebral palsy (HCC)    Depression    Diabetes mellitus without complication (HCC)    Hypertension    Kidney stones    Schizoaffective disorder (HCC)    Scoliosis     Past Surgical History:  Procedure Laterality Date   BOWEL RESECTION     LAPAROTOMY N/A 05/15/2020   Procedure: EXPLORATORY LAPAROTOMY;  Surgeon: Henrene Dodge, MD;  Location: [facility] ORS;  Service: General;  Laterality: N/A;   Family History:  Family History  Problem Relation Age of Onset   Heart disease Father    Heart attack Father    Family Psychiatric  History:  Social History:  Social History   Substance and Sexual Activity  Alcohol Use No     Social History   Substance and Sexual Activity  Drug Use No    Social History   Socioeconomic History   Marital status: Single    Spouse name: Not on file   Number of children: Not on file   Years of education: Not on file   Highest education level: Not on file  Occupational History   Not on file  Tobacco Use   Smoking status: Never   Smokeless tobacco: Never  Vaping Use   Vaping Use: Never used  Substance and Sexual Activity   Alcohol use: No  Drug use: No   Sexual activity: Not Currently  Other Topics Concern   Not on file  Social History Narrative   Not on file   Social Determinants of Health   Financial Resource Strain: Not on file  Food Insecurity: Food Insecurity Present (04/09/2022)   Hunger Vital Sign    Worried About Running Out of Food in the Last Year: Often true    Ran Out of Food in the Last Year: Often true  Transportation Needs: Unmet Transportation Needs (04/09/2022)   PRAPARE - Administrator, Civil Service (Medical): Yes    Lack of Transportation (Non-Medical): Yes  Physical Activity: Not on file  Stress: Not on file  Social  Connections: Not on file   Additional Social History:    Allergies:   Allergies  Allergen Reactions   Phenytoin Sodium Extended Other (See Comments) and Nausea And Vomiting   Prednisone Other (See Comments)   Latex Hives, Nausea And Vomiting and Rash    Labs:  Results for orders placed or performed during the hospital encounter of 06/07/22 (from the past 48 hour(s))  Comprehensive metabolic panel     Status: Abnormal   Collection Time: 06/07/22  1:26 PM  Result Value Ref Range   Sodium 139 135 - 145 mmol/L   Potassium 5.0 3.5 - 5.1 mmol/L   Chloride 110 98 - 111 mmol/L   CO2 25 22 - 32 mmol/L   Glucose, Bld 136 (H) 70 - 99 mg/dL    Comment: Glucose reference range applies only to samples taken after fasting for at least 8 hours.   BUN 20 6 - 20 mg/dL   Creatinine, Ser 1.61 0.61 - 1.24 mg/dL   Calcium 9.1 8.9 - 09.6 mg/dL   Total Protein 6.6 6.5 - 8.1 g/dL   Albumin 3.7 3.5 - 5.0 g/dL   AST 19 15 - 41 U/L   ALT 15 0 - 44 U/L   Alkaline Phosphatase 72 38 - 126 U/L   Total Bilirubin 0.6 0.3 - 1.2 mg/dL   GFR, Estimated >04 >54 mL/min    Comment: (NOTE) Calculated using the CKD-EPI Creatinine Equation (2021)    Anion gap 4 (L) 5 - 15    Comment: Performed at [facility], [address], [place], [place] [address]  Ethanol     Status: None   Collection Time: 06/07/22  1:26 PM  Result Value Ref Range   Alcohol, Ethyl (B) <10 <10 mg/dL    Comment: (NOTE) Lowest detectable limit for serum alcohol is 10 mg/dL.  For medical purposes only. Performed at [facility], [address] [place], [place] [address]   Salicylate level     Status: Abnormal   Collection Time: 06/07/22  1:26 PM  Result Value Ref Range   Salicylate Lvl <7.0 (L) 7.0 - 30.0 mg/dL    Comment: Performed at [facility], [address] [place], [place] [address]  Acetaminophen level     Status: Abnormal   Collection Time: 06/07/22  1:26 PM  Result Value Ref Range    Acetaminophen (Tylenol), Serum <10 (L) 10 - 30 ug/mL    Comment: (NOTE) Therapeutic concentrations vary significantly. A range of 10-30 ug/mL  may be an effective concentration for many patients. However, some  are best treated at concentrations outside of this range. Acetaminophen concentrations >150 ug/mL at 4 hours after ingestion  and >50 ug/mL at 12 hours after ingestion are often associated with  toxic reactions.  Performed at [facility], [address] [place], [place] [address]   cbc     Status: Abnormal   Collection Time: 06/07/22  1:26 PM  Result Value Ref Range   WBC 5.3 4.0 - 10.5 K/uL   RBC 4.19 (L) 4.22 - 5.81 MIL/uL   Hemoglobin 11.5 (L) 13.0 - 17.0 g/dL   HCT 32.435.0 (L) 40.139.0 - 02.752.0 %   MCV 83.5 80.0 - 100.0 fL   MCH 27.4 26.0 - 34.0 pg   MCHC 32.9 30.0 - 36.0 g/dL   RDW 25.316.9 (H) 66.411.5 - 40.315.5 %   Platelets 139 (L) 150 - 400 K/uL   nRBC 0.0 0.0 - 0.2 %    Comment: Performed at [facility], [address] [place], [place] [address]  Urine Drug Screen, Qualitative     Status: Abnormal   Collection Time: 06/07/22  1:30 PM  Result Value Ref Range   Tricyclic, Ur Screen POSITIVE (A) NONE DETECTED   Amphetamines, Ur Screen NONE DETECTED NONE DETECTED   MDMA (Ecstasy)Ur Screen NONE DETECTED NONE DETECTED   Cocaine Metabolite,Ur [place] NONE DETECTED NONE DETECTED   Opiate, Ur Screen NONE DETECTED NONE DETECTED   Phencyclidine (PCP) Ur S NONE DETECTED NONE DETECTED   Cannabinoid 50 Ng, Ur [place] NONE DETECTED NONE DETECTED   Barbiturates, Ur Screen NONE DETECTED NONE DETECTED   Benzodiazepine, Ur Scrn NONE DETECTED NONE DETECTED   Methadone Scn, Ur NONE DETECTED NONE DETECTED    Comment: (NOTE) Tricyclics + metabolites, urine    Cutoff 1000 ng/mL Amphetamines + metabolites, urine  Cutoff 1000 ng/mL MDMA (Ecstasy), urine              Cutoff 500 ng/mL Cocaine Metabolite, urine          Cutoff 300 ng/mL Opiate + metabolites, urine        Cutoff 300  ng/mL Phencyclidine (PCP), urine         Cutoff 25 ng/mL Cannabinoid, urine                 Cutoff 50 ng/mL Barbiturates + metabolites, urine  Cutoff 200 ng/mL Benzodiazepine, urine              Cutoff 200 ng/mL Methadone, urine                   Cutoff 300 ng/mL  The urine drug screen provides only a preliminary, unconfirmed analytical test result and should not be used for non-medical purposes. Clinical consideration and professional judgment should be applied to any positive drug screen result due to possible interfering substances. A more specific alternate chemical method must be used in order to obtain a confirmed analytical result. Gas chromatography / mass spectrometry (GC/MS) is the preferred confirm atory method. Performed at [facility], [address] [place], [place] [address]   Valproic acid level     Status: Abnormal   Collection Time: 06/07/22  1:41 PM  Result Value Ref Range   Valproic Acid Lvl 20 (L) 50.0 - 100.0 ug/mL    Comment: Performed at [facility], [address] [place], [place] [address]    Current Facility-Administered Medications  Medication Dose Route Frequency Provider Last Rate Last Admin   aspirin EC tablet 81 mg  81 mg Oral Daily Chesley NoonJessup, Charles, MD       benztropine (COGENTIN) tablet 1 mg  1 mg Oral Daily Chesley NoonJessup, Charles, MD       divalproex (DEPAKOTE) DR tablet  750 mg  750 mg Oral Q12H Chesley Noon, MD       Melene Muller ON 06/08/2022] fenofibrate tablet 54 mg  54 mg Oral Daily Chesley Noon, MD       gemfibrozil (LOPID) tablet 600 mg  600 mg Oral BID Chesley Noon, MD       hydrOXYzine (ATARAX) tablet 50 mg  50 mg Oral Q4H PRN Chesley Noon, MD       Melene Muller ON 06/08/2022] levothyroxine (SYNTHROID) tablet 88 mcg  88 mcg Oral Q0600 Chesley Noon, MD       lisinopril (ZESTRIL) tablet 10 mg  10 mg Oral Daily Chesley Noon, MD       metFORMIN (GLUCOPHAGE) tablet 1,000 mg  1,000 mg Oral BID WC Chesley Noon, MD        OLANZapine (ZYPREXA) tablet 20 mg  20 mg Oral QHS Chesley Noon, MD       pioglitazone (ACTOS) tablet 45 mg  45 mg Oral Daily Chesley Noon, MD       simvastatin (ZOCOR) tablet 20 mg  20 mg Oral QHS Chesley Noon, MD       traZODone (DESYREL) tablet 150 mg  150 mg Oral QHS Chesley Noon, MD       Current Outpatient Medications  Medication Sig Dispense Refill   aspirin EC 81 MG tablet Take 1 tablet (81 mg total) by mouth daily. Swallow whole. 30 tablet 12   benztropine (COGENTIN) 1 MG tablet Take 1 tablet (1 mg total) by mouth daily. 30 tablet 1   divalproex (DEPAKOTE) 250 MG DR tablet Take 3 tablets (750 mg total) by mouth every 12 (twelve) hours. 180 tablet 1   fenofibrate 54 MG tablet Take 1 tablet (54 mg total) by mouth daily. 30 tablet 1   furosemide (LASIX) 20 MG tablet Take 20 mg by mouth daily.     gemfibrozil (LOPID) 600 MG tablet Take 1 tablet (600 mg total) by mouth 2 (two) times daily. 60 tablet 1   hydrOXYzine (ATARAX) 50 MG tablet Take 1 tablet (50 mg total) by mouth every 4 (four) hours as needed for anxiety. 30 tablet 1   levothyroxine (SYNTHROID) 88 MCG tablet Take 1 tablet (88 mcg total) by mouth daily at 6 (six) AM. 30 tablet 1   lisinopril (ZESTRIL) 10 MG tablet Take 1 tablet (10 mg total) by mouth daily. 30 tablet 1   metFORMIN (GLUCOPHAGE) 1000 MG tablet Take 1 tablet (1,000 mg total) by mouth 2 (two) times daily with a meal. 60 tablet 1   OLANZapine (ZYPREXA) 20 MG tablet Take 1 tablet (20 mg total) by mouth at bedtime. 30 tablet 1   pioglitazone (ACTOS) 45 MG tablet Take 1 tablet (45 mg total) by mouth daily. 30 tablet 1   simvastatin (ZOCOR) 20 MG tablet Take 1 tablet (20 mg total) by mouth at bedtime. 30 tablet 1   traZODone (DESYREL) 150 MG tablet Take 1 tablet (150 mg total) by mouth at bedtime. 30 tablet 1   ABILIFY MAINTENA 400 MG PRSY prefilled syringe Inject 400 mg into the muscle every 28 (twenty-eight) days.     ARIPiprazole ER (ABILIFY MAINTENA) 400 MG  SRER injection Inject 2 mLs (400 mg total) into the muscle every 28 (twenty-eight) days. 1 each 1    Musculoskeletal: Strength & Muscle Tone: within normal limits Gait & Station: normal Patient leans: N/A     Psychiatric Specialty Exam:  Presentation  General Appearance:  Appropriate for Environment  Eye Contact: Good  Speech:  Clear and Coherent  Speech Volume: Normal  Handedness: Right   Mood and Affect  Mood: Euthymic (at baseline)  Affect: Congruent (at baseline)   Thought Process  Thought Processes: Goal Directed  Descriptions of Associations:Intact  Orientation:Full (Time, Place and Person)  Thought Content:WDL (at baseline)  History of Schizophrenia/Schizoaffective disorder:Yes  Duration of Psychotic Symptoms:Greater than six months  Hallucinations:Hallucinations: None  Ideas of Reference:None  Suicidal Thoughts:Suicidal Thoughts: No  Homicidal Thoughts:Homicidal Thoughts: No   Sensorium  Memory: Immediate Good  Judgment: Poor  Insight: Poor   Executive Functions  Concentration: Fair  Attention Span: Fair  Recall: Fair  Fund of Knowledge: Fair  Language: Fair   Psychomotor Activity  Psychomotor Activity:Psychomotor Activity: Normal   Assets  Assets: Communication Skills; Desire for Improvement; Financial Resources/Insurance; Housing; Social Support; Resilience; Physical Health   Sleep  Sleep:Sleep: Good   Physical Exam: Physical Exam Vitals and nursing note reviewed.  HENT:     Head: Normocephalic.     Nose: No congestion or rhinorrhea.  Eyes:     General:        Right eye: No discharge.        Left eye: No discharge.  Cardiovascular:     Rate and Rhythm: Normal rate.  Pulmonary:     Effort: Pulmonary effort is normal.  Musculoskeletal:        General: Normal range of motion.     Cervical back: Normal range of motion.  Skin:    General: Skin is dry.  Neurological:     Mental Status: He is  alert and oriented to person, place, and time.  Psychiatric:        Attention and Perception: Attention normal.        Mood and Affect: Affect is blunt (at baseline).        Speech: Speech normal.        Behavior: Behavior normal. Behavior is cooperative.        Thought Content: Thought content is not paranoid or delusional. Thought content does not include homicidal or suicidal ideation.        Cognition and Memory: Cognition is impaired.        Judgment: Judgment is impulsive.    Review of Systems  Constitutional: Negative.   HENT: Negative.    Respiratory: Negative.    Skin: Negative.   Neurological:        At baseline. Has cerebral palsy. Minimal impairment in movement  Psychiatric/Behavioral:  Positive for depression (chronic/stable).        Schizophrenia   Blood pressure (!) 146/78, pulse 87, temperature 97.6 F (36.4 C), temperature source Oral, resp. rate 19, height 6\' 3"  (1.905 m), weight (!) 244.9 kg, SpO2 100 %. Body mass index is 67.5 kg/m.  Treatment Plan Summary: Plan Patient is anxious about the upcoming move from his apartment to  a group home. He doesn't know when this is going to happen, which causes him stress. He wants to move to a group home because he gets lonely and wants people  to talk to.  Patient says he is compliant with his meds, brings them weekly. He says he got his LAI last week. Reviewed with Dr. [name]   Disposition: No evidence of imminent risk to self or others at present.   Patient does not meet criteria for psychiatric inpatient admission. Supportive therapy provided about ongoing stressors. Discussed crisis plan, support from social network, calling 911, coming to the Emergency Department, and calling Suicide Hotline.  [name], NP 06/07/2022 4:14 PM

## 2022-06-07 NOTE — ED Notes (Addendum)
[name] Legal Guardian [phone]  [phone] Legal Guardian   Returned phone call, states that she is no longer pt's legal guardian. [place] [facility], Nancy Fetter has been assigned to patient as legal guardian.

## 2022-06-07 NOTE — ED Notes (Signed)
[employer] [place] DSS  [phone], [place]  left messages,  called for on call DSS through c com  [phone]

## 2022-06-07 NOTE — ED Notes (Signed)
Called [facility] and left HIPAA compliant VM.

## 2022-06-07 NOTE — ED Notes (Signed)
Called C com for DSS to be called  [phone]

## 2022-06-07 NOTE — ED Notes (Addendum)
First Nurse Note: Patient to ED via [facility] from home. Patient c/o anxiety. Patient's social worker is trying to find placement for him in group home which has created this anxiety. Patient also confused about medication and therefore; has not taken any this AM. Patient sitting calmly in wheelchair in waiting room at this time.

## 2022-06-07 NOTE — ED Notes (Signed)
Several attempts have been made to call DSS for patient's discharge. Will page social worker on call after 1700.

## 2022-06-07 NOTE — ED Triage Notes (Signed)
States he has been living in his current residence for 14 years, lives alone. Pt states he is trying to get into a group home. Today he just feels a lot of anxiety. Didn't take his daily medications today. Pt denies SI/HI.

## 2022-06-07 NOTE — ED Notes (Addendum)
DSS called and said pt's legal guardian will be able to get transport for pt after 8 am tomorrow 11/28. Pt will need to remain in ED until then to ensure pt safety. Charge nurse and pt made aware.

## 2022-06-07 NOTE — ED Notes (Signed)
Pt given nighttime snack. 

## 2022-06-07 NOTE — ED Notes (Addendum)
Dressed out into hospital scrubs by RN and EDT, faith. Pair of black tennis shoes, underwear, belt, jeans and t shirt, cell phone, charger, keys, wallet with ID only placed into 1 patient belonging bag.

## 2022-06-07 NOTE — ED Notes (Signed)
Report given to Andrea Bryant RN 

## 2022-06-07 NOTE — BH Assessment (Signed)
Comprehensive Clinical Assessment (CCA) Screening, Triage and Referral Note  06/07/2022 Derrick Atkins [id number]  Derrick Atkins, 51 year old male who presents to [facility] ED voluntarily for treatment. Per triage note, states he has been living in his current residence for 14 years, lives alone. Pt states he is trying to get into a group home. Today he just feels a lot of anxiety. Didn't take his daily medications today. Pt denies SI/HI.   During TTS assessment pt presents alert and oriented x 4, restless but cooperative, and mood-congruent with affect. The pt does not appear to be responding to internal or external stimuli. Neither is the pt presenting with any delusional thinking. Pt verified the information provided to triage RN.   Pt identifies his main complaint to be that he had a nervous breakdown. Patient reports he has become anxious while waiting for Derrick Atkins to find him future housing. Patient reports he does not have anyone to talk to so he called 911. Patient was aware that he should not call 911 for those reasons where he is not in imminent danger. Patient states he looks forward to being in a group home where he will have housemates that he can be around. Psych Team suggested patient call his ACT Team for peer support when he is feeling this way. Patient verbalized understanding. Patient reports he would love to get a part time job to keep him busy but he does not have a driver's license. Psych Team encouraged patient to become more independent. Patient states he has been feeling lonely since the passing of his parents. Patient reports he has not seen his brother that often because he is busy with his family. Patient reports he is compliant with his meds and takes them as prescribed; however, he states they make him feel "intoxicated." Pt denies current SI/HI/AH/VH.    Per Derrick Ober, NP, pt shows no evidence of imminent risk to self or others at present and does not meet criteria for  psychiatric inpatient admission.   Chief Complaint:  Chief Complaint  Patient presents with   Anxiety   Visit Diagnosis: Schizophrenia, paranoid, chronic  Patient Reported Information How did you hear about [place]? Self  What Is the Reason for Your Visit/Call Today? Patient reports he called 911 because he was feeling anxious about his current housing situation and did not have anyone to talk to.  How Long Has This Been Causing You Problems? > than 6 months  What Do You Feel Would Help You the Most Today? -- (Assessment only)   Have You Recently Had Any Thoughts About Hurting Yourself? No  Are You Planning to Commit Suicide/Harm Yourself At This time? No   Have you Recently Had Thoughts About Hurting Someone Derrick Atkins? No  Are You Planning to Harm Someone at This Time? No  Explanation: No data recorded  Have You Used Any Alcohol or Drugs in the Past 24 Hours? No  How Long Ago Did You Use Drugs or Alcohol? No data recorded What Did You Use and How Much? No data recorded  Do You Currently Have a Therapist/Psychiatrist? Yes  Name of Therapist/Psychiatrist: Easter Atkins   Have You Been Recently Discharged From Any Public relations account executive or Programs? No  Explanation of Discharge From Practice/Program: No data recorded   CCA Screening Triage Referral Assessment Type of Contact: Face-to-Face  Telemedicine Service Delivery:   Is this Initial or Reassessment?   Date Telepsych consult ordered in [facility]:    Time Telepsych consult ordered in [facility]:  Location of Assessment: [facility] ED  Provider Location: [facility] ED    Collateral Involvement: None provided   Does Patient Have a Court Appointed Legal Guardian? No data recorded Name and Contact of Legal Guardian: No data recorded If Minor and Not Living with Parent(s), Who has Custody? n/a  Is CPS involved or ever been involved? Never  Is APS involved or ever been involved? Never   Patient Determined To Be At Risk for Harm To Self or  Others Based on Review of Patient Reported Information or Presenting Complaint? No  Method: No data recorded Availability of Means: No data recorded Intent: No data recorded Notification Required: No data recorded Additional Information for Danger to Others Potential: No data recorded Additional Comments for Danger to Others Potential: No data recorded Are There Guns or Other Weapons in Your Home? No data recorded Types of Guns/Weapons: No data recorded Are These Weapons Safely Secured?                            No data recorded Who Could Verify You Are Able To Have These Secured: No data recorded Do You Have any Outstanding Charges, Pending Court Dates, Parole/Probation? No data recorded Contacted To Inform of Risk of Harm To Self or Others: Other: Comment   Does Patient Present under Involuntary Commitment? No    [place] of Residence: [place]   Patient Currently Receiving the Following Services: ACTT Psychologist, educational); Medication Management   Determination of Need: Emergent (2 hours)   Options For Referral: ED Visit   Discharge Disposition:     Derrick Atkins, Counselor, LCAS-A

## 2022-06-07 NOTE — ED Notes (Signed)
Lucretia Roers from DSS called and stated that she will call back with directions for transport.

## 2022-06-07 NOTE — ED Notes (Signed)
Patient is being seen by pysch.

## 2022-06-07 NOTE — ED Notes (Signed)
Report received from Ally RN. 

## 2022-06-07 NOTE — ED Notes (Signed)
Pt given dinner tray and drink 

## 2022-06-08 DIAGNOSIS — F200 Paranoid schizophrenia: Secondary | ICD-10-CM | POA: Diagnosis not present

## 2022-06-08 NOTE — ED Notes (Signed)
Breakfast meal tray given with orange juice.

## 2022-06-08 NOTE — ED Notes (Signed)
Lunch meal tray given at this time with Sprite.

## 2022-06-08 NOTE — ED Notes (Signed)
Pt's social worker from DSS here to speak to patient. SW informed this RN that they need to go assess his home and will come back and then pick up patient.   [address] [place], [place]  [phone] (phone) [phone] (cell)

## 2022-06-08 NOTE — ED Notes (Signed)
Belongings returned at this time.

## 2022-06-08 NOTE — ED Notes (Addendum)
Attempted to call DSS SW with numbers provided at this time, pt continues to inquire when they will be back. Explained to pt that this RN attempted to call with no success and this RN was told the same thing the pt was told but they did not give an ETA.

## 2022-06-08 NOTE — ED Notes (Signed)
Pt given graham crackers at this time. °

## 2022-06-08 NOTE — ED Notes (Signed)
Pt sitting up on side of stretcher; provided a cup of ice water per his request. No distress noted.

## 2022-06-08 NOTE — ED Notes (Signed)
DSS called at this time stating that ride is on their way.

## 2022-07-29 ENCOUNTER — Encounter: Payer: Self-pay | Admitting: Emergency Medicine

## 2022-07-29 ENCOUNTER — Emergency Department
Admission: EM | Admit: 2022-07-29 | Discharge: 2022-07-29 | Disposition: A | Discharge status: Home / Self Care | Payer: 59 | Attending: Emergency Medicine | Admitting: Emergency Medicine

## 2022-07-29 ENCOUNTER — Other Ambulatory Visit: Payer: Self-pay

## 2022-07-29 DIAGNOSIS — X58XXXA Exposure to other specified factors, initial encounter: Secondary | ICD-10-CM | POA: Insufficient documentation

## 2022-07-29 DIAGNOSIS — S058X1A Other injuries of right eye and orbit, initial encounter: Secondary | ICD-10-CM | POA: Diagnosis present

## 2022-07-29 DIAGNOSIS — S0501XA Injury of conjunctiva and corneal abrasion without foreign body, right eye, initial encounter: Secondary | ICD-10-CM | POA: Insufficient documentation

## 2022-07-29 MED ORDER — FLUORESCEIN SODIUM 1 MG OP STRP
1.0000 | ORAL_STRIP | Freq: Once | OPHTHALMIC | Status: AC
  Administered 2022-07-29: 1 via OPHTHALMIC
  Filled 2022-07-29: qty 1

## 2022-07-29 MED ORDER — TETRACAINE HCL 0.5 % OP SOLN
1.0000 [drp] | Freq: Once | OPHTHALMIC | Status: AC
  Administered 2022-07-29: 1 [drp] via OPHTHALMIC
  Filled 2022-07-29: qty 4

## 2022-07-29 NOTE — ED Triage Notes (Signed)
First Nurse Note:  Arrives from [facility] for ED evaluation of right eye and bilateral leg swelling since Monday.  AAox3.  Skin warm and dry. NAD

## 2022-07-29 NOTE — ED Provider Notes (Signed)
   [facility] Provider Note    Event Date/Time   First MD Initiated Contact with Patient 07/29/22 1341     (approximate)   History   Right eye blurriness   HPI  CLIFFORD BENNINGER is a 52 y.o. male who presents with complaints of right eye blurriness.  Patient reports several days ago he felt he got something in his right eye, his eye was painful and watery, now he reports it seems a little blurry to look out of his right eye.  He reports it is no longer painful.  No neurodeficits     Physical Exam   Triage Vital Signs: ED Triage Vitals  Enc Vitals Group     BP 07/29/22 1311 111/63     Pulse Rate 07/29/22 1311 85     Resp 07/29/22 1311 18     Temp 07/29/22 1311 98 F (36.7 C)     Temp src --      SpO2 07/29/22 1311 100 %     Weight 07/29/22 1338 (!) 244 kg (537 lb 14.8 oz)     Height 07/29/22 1338 1.905 m (6\' 3" )     Head Circumference --      Peak Flow --      Pain Score 07/29/22 1310 5     Pain Loc --      Pain Edu? --      Excl. in [place]? --     Most recent vital signs: Vitals:   07/29/22 1311  BP: 111/63  Pulse: 85  Resp: 18  Temp: 98 F (36.7 C)  SpO2: 100%     General: Awake, no distress.  CV:  Good peripheral perfusion.  Resp:  Normal effort.  Abd:  No distention.   Other:  PERRLA, EOMI, visual acuity reportedly 20/30 in the right eye, 20/20 in the left eye   ED Results / Procedures / Treatments   Labs (all labs ordered are listed, but only abnormal results are displayed) Labs Reviewed - No data to display   EKG     RADIOLOGY     PROCEDURES:  Critical Care performed:   Procedures   MEDICATIONS ORDERED IN ED: Medications  tetracaine (PONTOCAINE) 0.5 % ophthalmic solution 1 drop (1 drop Right Eye Given by Other 07/29/22 1454)  fluorescein ophthalmic strip 1 strip (1 strip Right Eye Given by Other 07/29/22 1454)     IMPRESSION / MDM / [place] / ED COURSE  I reviewed the triage vital signs and  the nursing notes. Patient's presentation is most consistent with acute presentation with potential threat to life or bodily function.   Patient presents with blurry vision.  Differential is extensive, history suggests possible corneal abrasion   Fluorescein exam demonstrates faint uptake over the right pupil suspect healing corneal abrasion but I have urged legal guardian and patient to follow-up closely with ophthalmology for further exam       FINAL CLINICAL IMPRESSION(S) / ED DIAGNOSES   Final diagnoses:  Abrasion of right cornea, initial encounter     Rx / DC Orders   ED Discharge Orders     None        Note:  This document was prepared using Dragon voice recognition software and may include unintentional dictation errors.   Lavonia Drafts, MD 07/29/22 [phone]

## 2022-07-29 NOTE — Discharge Instructions (Addendum)
I suspect your eye has been 'scratched' and that is why your vision is blurry, I do feel this needs to be evaluated by an eye doctor. Please follow up closely with Dr. Wallace Going

## 2022-07-29 NOTE — ED Triage Notes (Signed)
Pt comes with c/o bilateral leg swelling and some redness. Pt also states right eye pain and swelling.

## 2022-08-01 ENCOUNTER — Emergency Department
Admission: EM | Admit: 2022-08-01 | Discharge: 2022-08-02 | Disposition: A | Discharge status: Home / Self Care | Payer: 59 | Attending: Student in an Organized Health Care Education/Training Program | Admitting: Student in an Organized Health Care Education/Training Program

## 2022-08-01 DIAGNOSIS — Z0000 Encounter for general adult medical examination without abnormal findings: Secondary | ICD-10-CM | POA: Diagnosis not present

## 2022-08-01 NOTE — ED Notes (Signed)
Notified group home that patient is ready for discharge.  Worker at home stated she would call the owner regarding transportation.

## 2022-08-01 NOTE — ED Notes (Signed)
Patient remains in his clothing and has his belongings.  Patient is voluntary and denies si/hi at this time.

## 2022-08-01 NOTE — ED Notes (Signed)
Patient reports recently placed in a new group home and not adjusting well.  Patient reports feels like he is being given too much medication and the food is not good.  Also reported that it is loud and that increases his anxiety.

## 2022-08-01 NOTE — ED Notes (Signed)
First Nurse Note: Pt arrives via [facility] from [facility] home. States he's having "issues with staff" and a "nervous break down". Pt calm and cooperative in triage.   VS with EMS 140/103 60

## 2022-08-01 NOTE — ED Notes (Signed)
Received call from Stephanie Coup, social worker that they would come pick patient up at 8:30 am tomorrow.

## 2022-08-01 NOTE — ED Triage Notes (Signed)
Pt sts that he has been at a new group home and is having a lot of anxiety due to the fact that the lady who runs the place is very loud and causes the pt a lot of anxiety and also the other residents. Pt sts that he use to live by himself and since moving into the group home there is just a lot of stuff going on.

## 2022-08-01 NOTE — ED Provider Notes (Signed)
[facility] Provider Note    Event Date/Time   First MD Initiated Contact with Patient 08/01/22 2141     (approximate)   History   Panic Attack   HPI  Derrick Atkins is a 52 y.o. male extensive past psychiatric history who presents to the ER for evaluation of concern that the group home members are giving him the wrong medications.  He denies any pain no nausea.  No headache.  States he got an argument with the group staff member.  He states that he does not think that she likes.  He feels better now having some time away.  Denies any SI or HI.     Physical Exam   Triage Vital Signs: ED Triage Vitals  Enc Vitals Group     BP 08/01/22 2125 106/64     Pulse Rate 08/01/22 2125 89     Resp 08/01/22 2125 18     Temp 08/01/22 2125 97.8 F (36.6 C)     Temp Source 08/01/22 2125 Oral     SpO2 08/01/22 2125 98 %     Weight 08/01/22 2126 230 lb (104.3 kg)     Height --      Head Circumference --      Peak Flow --      Pain Score 08/01/22 2126 7     Pain Loc --      Pain Edu? --      Excl. in [place]? --     Most recent vital signs: Vitals:   08/01/22 2125  BP: 106/64  Pulse: 89  Resp: 18  Temp: 97.8 F (36.6 C)  SpO2: 98%     Constitutional: Alert  Eyes: Conjunctivae are normal.  Head: Atraumatic. Nose: No congestion/rhinnorhea. Mouth/Throat: Mucous membranes are moist.   Neck: Painless ROM.  Cardiovascular:   Good peripheral circulation. Respiratory: Normal respiratory effort.  No retractions.  Gastrointestinal: Soft and nontender.  Musculoskeletal:  no deformity Neurologic:  MAE spontaneously. No gross focal neurologic deficits are appreciated.  Skin:  Skin is warm, dry and intact. No rash noted. Psychiatric: calm and cooperative    ED Results / Procedures / Treatments   Labs (all labs ordered are listed, but only abnormal results are displayed) Labs Reviewed - No data to display   EKG      PROCEDURES:  Critical Care  performed: No  Procedures   MEDICATIONS ORDERED IN ED: Medications - No data to display   IMPRESSION / MDM / [place] / ED COURSE  I reviewed the triage vital signs and the nursing notes.                              Differential diagnosis includes, but is not limited to, medication evaluation, electrolyte, anxiety, depression, psychosis  Patient presented to ER.  He is hemodynamically stable.  No acute distress.  Appears at his baseline.  No SI or HI.  No indication for IVC.  No indication for further diagnostic testing here in the ER.  Does appear stable and appropriate for discharge back to home.       FINAL CLINICAL IMPRESSION(S) / ED DIAGNOSES   Final diagnoses:  Well adult health check     Rx / DC Orders   ED Discharge Orders     None        Note:  This document was prepared using Dragon voice recognition software and may  include unintentional dictation errors.    Merlyn Lot, MD 08/01/22 2224

## 2022-08-02 NOTE — ED Provider Notes (Signed)
Patient group home here to pick him up.  I reviewed his documentation and labs and vitals.  No apparent emergent medical pathology or indication for further admission or workup at this time. He is well appearing and in NAD.    Duffy Bruce, MD 08/02/22 [phone]

## 2022-08-02 NOTE — ED Notes (Signed)
VOL/D/C in Am

## 2022-09-08 ENCOUNTER — Encounter: Payer: Self-pay | Admitting: Ophthalmology

## 2022-09-10 NOTE — Anesthesia Preprocedure Evaluation (Signed)
Anesthesia Evaluation  Patient identified by MRN, date of birth, ID band Patient awake    Reviewed: Allergy & Precautions, NPO status , Patient's Chart, lab work & pertinent test results  History of Anesthesia Complications Negative for: history of anesthetic complications  Airway Mallampati: III  TM Distance: >3 FB Neck ROM: full    Dental  (+) Chipped, Poor Dentition   Pulmonary neg pulmonary ROS, neg shortness of breath   Pulmonary exam normal        Cardiovascular Exercise Tolerance: Good hypertension, Normal cardiovascular exam  ECG 04/11/22:  Sinus tachycardia ST & T wave abnormality, consider lateral ischemia Abnormal ECG When compared with ECG of 15-Mar-2022 16:54, No significant change was found   Neuro/Psych  PSYCHIATRIC DISORDERS  Depression  Schizophrenia  Cerebral palsy; scoliosis  Neuromuscular disease (diabetic polyneuropathy)    GI/Hepatic negative GI ROS, Neg liver ROS,,,  Endo/Other  diabetes, Type 2    Renal/GU Renal disease (nephrolithiasis)     Musculoskeletal   Abdominal   Peds  Hematology negative hematology ROS (+)   Anesthesia Other Findings Past Medical History: No date: Cerebral palsy (HCC) No date: Depression No date: Diabetes mellitus without complication (HCC) No date: Hypertension No date: Kidney stones No date: Schizoaffective disorder (HCC) No date: Scoliosis  Past Surgical History: No date: BOWEL RESECTION 05/15/2020: LAPAROTOMY; N/A     Comment:  Procedure: EXPLORATORY LAPAROTOMY;  Surgeon: Olean Ree, MD;  Location: [facility] ORS;  Service: General;                Laterality: N/A;  BMI    Body Mass Index: 30.16 kg/m      Reproductive/Obstetrics negative OB ROS                             Anesthesia Physical Anesthesia Plan  ASA: 3  Anesthesia Plan: MAC   Post-op Pain Management:    Induction: Intravenous  PONV Risk  Score and Plan:   Airway Management Planned: Natural Airway and Nasal Cannula  Additional Equipment:   Intra-op Plan:   Post-operative Plan:   Informed Consent: I have reviewed the patients History and Physical, chart, labs and discussed the procedure including the risks, benefits and alternatives for the proposed anesthesia with the patient or authorized representative who has indicated his/her understanding and acceptance.     Consent reviewed with POA  Plan Discussed with: Anesthesiologist, CRNA and Surgeon  Anesthesia Plan Comments: (Phone consent obtained from [facility], legal guardian with DSS, by [facility] on 09/10/22.)        Anesthesia Quick Evaluation

## 2022-09-13 NOTE — Discharge Instructions (Signed)

## 2022-09-15 ENCOUNTER — Ambulatory Visit
Admission: RE | Admit: 2022-09-15 | Discharge: 2022-09-15 | Disposition: A | Discharge status: Home / Self Care | Payer: 59 | Attending: Ophthalmology | Admitting: Ophthalmology

## 2022-09-15 ENCOUNTER — Encounter: Payer: Self-pay | Admitting: Ophthalmology

## 2022-09-15 ENCOUNTER — Other Ambulatory Visit: Payer: Self-pay

## 2022-09-15 ENCOUNTER — Encounter
Admission: RE | Disposition: A | Discharge status: Home / Self Care | Payer: Self-pay | Source: Home / Self Care | Attending: Ophthalmology

## 2022-09-15 ENCOUNTER — Ambulatory Visit: Payer: 59 | Admitting: Anesthesiology

## 2022-09-15 DIAGNOSIS — H2589 Other age-related cataract: Secondary | ICD-10-CM | POA: Insufficient documentation

## 2022-09-15 DIAGNOSIS — I10 Essential (primary) hypertension: Secondary | ICD-10-CM | POA: Insufficient documentation

## 2022-09-15 DIAGNOSIS — G809 Cerebral palsy, unspecified: Secondary | ICD-10-CM | POA: Diagnosis not present

## 2022-09-15 DIAGNOSIS — E1136 Type 2 diabetes mellitus with diabetic cataract: Secondary | ICD-10-CM | POA: Diagnosis present

## 2022-09-15 DIAGNOSIS — M419 Scoliosis, unspecified: Secondary | ICD-10-CM | POA: Insufficient documentation

## 2022-09-15 HISTORY — PX: CATARACT EXTRACTION W/PHACO: SHX586

## 2022-09-15 LAB — GLUCOSE, CAPILLARY
Glucose-Capillary: 77 mg/dL (ref 70–99)
Glucose-Capillary: 86 mg/dL (ref 70–99)

## 2022-09-15 SURGERY — PHACOEMULSIFICATION, CATARACT, WITH IOL INSERTION
Anesthesia: Monitor Anesthesia Care | Site: Eye | Laterality: Right

## 2022-09-15 MED ORDER — LACTATED RINGERS IV SOLN: INTRAVENOUS | Status: DC

## 2022-09-15 MED ORDER — TETRACAINE HCL 0.5 % OP SOLN
1.0000 [drp] | OPHTHALMIC | Status: DC | PRN
  Administered 2022-09-15 (×3): 1 [drp] via OPHTHALMIC

## 2022-09-15 MED ORDER — SIGHTPATH DOSE#1 BSS IO SOLN
INTRAOCULAR | Status: DC | PRN
  Administered 2022-09-15: 15 mL

## 2022-09-15 MED ORDER — SIGHTPATH DOSE#1 BSS IO SOLN
INTRAOCULAR | Status: DC | PRN
  Administered 2022-09-15: 63 mL via OPHTHALMIC

## 2022-09-15 MED ORDER — BRIMONIDINE TARTRATE-TIMOLOL 0.2-0.5 % OP SOLN
OPHTHALMIC | Status: DC | PRN
  Administered 2022-09-15: 1 [drp] via OPHTHALMIC

## 2022-09-15 MED ORDER — ARMC OPHTHALMIC DILATING DROPS
1.0000 | OPHTHALMIC | Status: DC | PRN
  Administered 2022-09-15 (×3): 1 via OPHTHALMIC

## 2022-09-15 MED ORDER — FENTANYL CITRATE (PF) 100 MCG/2ML IJ SOLN
INTRAMUSCULAR | Status: DC | PRN
  Administered 2022-09-15: 100 ug via INTRAVENOUS

## 2022-09-15 MED ORDER — MIDAZOLAM HCL 2 MG/2ML IJ SOLN
INTRAMUSCULAR | Status: DC | PRN
  Administered 2022-09-15: 2 mg via INTRAVENOUS

## 2022-09-15 MED ORDER — CEFUROXIME OPHTHALMIC INJECTION 1 MG/0.1 ML
INJECTION | OPHTHALMIC | Status: DC | PRN
  Administered 2022-09-15: .1 mL via INTRACAMERAL

## 2022-09-15 MED ORDER — SIGHTPATH DOSE#1 NA HYALUR & NA CHOND-NA HYALUR IO KIT
PACK | INTRAOCULAR | Status: DC | PRN
  Administered 2022-09-15: 1 via OPHTHALMIC

## 2022-09-15 MED ORDER — TRYPAN BLUE 0.06 % IO SOSY
PREFILLED_SYRINGE | INTRAOCULAR | Status: DC | PRN
  Administered 2022-09-15: .2 mL via INTRAOCULAR

## 2022-09-15 MED ORDER — SIGHTPATH DOSE#1 BSS IO SOLN
INTRAOCULAR | Status: DC | PRN
  Administered 2022-09-15: 1 mL via INTRAMUSCULAR

## 2022-09-15 MED ORDER — DEXTROSE 50 % IV SOLN
12.5000 g | Freq: Once | INTRAVENOUS | Status: AC
  Administered 2022-09-15: 12.5 g via INTRAVENOUS

## 2022-09-15 MED ORDER — SODIUM HYALURONATE 23MG/ML IO SOSY
PREFILLED_SYRINGE | INTRAOCULAR | Status: DC | PRN
  Administered 2022-09-15: .6 mL via INTRAOCULAR

## 2022-09-15 SURGICAL SUPPLY — 14 items
CANNULA ANT/CHMB 27G (MISCELLANEOUS) IMPLANT
CANNULA ANT/CHMB 27GA (MISCELLANEOUS) ×1 IMPLANT
CATARACT SUITE SIGHTPATH (MISCELLANEOUS) ×1 IMPLANT
FEE CATARACT SUITE SIGHTPATH (MISCELLANEOUS) ×1 IMPLANT
GLOVE SRG 8 PF TXTR STRL LF DI (GLOVE) ×1 IMPLANT
GLOVE SURG GAMMEX PI TX LF 7.5 (GLOVE) IMPLANT
GLOVE SURG UNDER POLY LF SZ8 (GLOVE) ×1
LENS IOL TECNIS EYHANCE 20.0 (Intraocular Lens) IMPLANT
NDL FILTER BLUNT 18X1 1/2 (NEEDLE) ×1 IMPLANT
NEEDLE FILTER BLUNT 18X1 1/2 (NEEDLE) ×1 IMPLANT
SUT VICRYL  9 0 (SUTURE)
SUT VICRYL 9 0 (SUTURE) IMPLANT
SYR 3ML LL SCALE MARK (SYRINGE) ×1 IMPLANT
WATER STERILE IRR 250ML POUR (IV SOLUTION) ×1 IMPLANT

## 2022-09-15 NOTE — Anesthesia Postprocedure Evaluation (Signed)
Anesthesia Post Note  Patient: Derrick Atkins  Procedure(s) Performed: CATARACT EXTRACTION PHACO AND INTRAOCULAR LENS PLACEMENT (IOC) RIGHT DIABETIC VISION BLUE HEALON 5  15.79  01:31.9 (Right: Eye)  Patient location during evaluation: PACU Anesthesia Type: MAC Level of consciousness: awake and alert Pain management: pain level controlled Vital Signs Assessment: post-procedure vital signs reviewed and stable Respiratory status: spontaneous breathing, nonlabored ventilation, respiratory function stable and patient connected to nasal cannula oxygen Cardiovascular status: blood pressure returned to baseline and stable Postop Assessment: no apparent nausea or vomiting Anesthetic complications: no   No notable events documented.   Last Vitals:  Vitals:   09/15/22 1139 09/15/22 1144  BP:  127/76  Pulse:  79  Resp:  16  Temp: 36.8 C   SpO2:  100%    Last Pain:  Vitals:   09/15/22 1144  PainSc: 0-No pain                 Precious Haws Vang Kraeger

## 2022-09-15 NOTE — H&P (Signed)
[place]   Primary Care Physician:  Baxter Hire, MD Ophthalmologist: Dr. Leandrew Koyanagi  Pre-Procedure History & Physical: HPI:  DAMIEAN MITSCH is a 52 y.o. male here for ophthalmic surgery.   Past Medical History:  Diagnosis Date   Cerebral palsy ([place])    Depression    Diabetes mellitus without complication ([place])    Hypertension    Kidney stones    Schizoaffective disorder ([place])    Scoliosis     Past Surgical History:  Procedure Laterality Date   BOWEL RESECTION     LAPAROTOMY N/A 05/15/2020   Procedure: EXPLORATORY LAPAROTOMY;  Surgeon: Olean Ree, MD;  Location: [facility] ORS;  Service: General;  Laterality: N/A;    Prior to Admission medications   Medication Sig Start Date End Date Taking? Authorizing Provider  ABILIFY MAINTENA 400 MG PRSY prefilled syringe Inject 400 mg into the muscle every 28 (twenty-eight) days.   Yes [provider]  ARIPiprazole ER (ABILIFY MAINTENA) 400 MG SRER injection Inject 2 mLs (400 mg total) into the muscle every 28 (twenty-eight) days. 04/21/22  Yes [name], [name], MD  aspirin EC 81 MG tablet Take 1 tablet (81 mg total) by mouth daily. Swallow whole. 04/09/22  Yes [name], [name], MD  benztropine (COGENTIN) 1 MG tablet Take 1 tablet (1 mg total) by mouth daily. 04/08/22  Yes [name], [name], MD  divalproex (DEPAKOTE) 250 MG DR tablet Take 3 tablets (750 mg total) by mouth every 12 (twelve) hours. 04/08/22  Yes [name], [name], MD  fenofibrate 54 MG tablet Take 1 tablet (54 mg total) by mouth daily. 04/08/22  Yes [name], [name], MD  furosemide (LASIX) 20 MG tablet Take 20 mg by mouth daily. 05/06/22 05/06/23 Yes [provider]  gemfibrozil (LOPID) 600 MG tablet Take 1 tablet (600 mg total) by mouth 2 (two) times daily. 04/08/22  Yes [name], [name], MD  hydrOXYzine (ATARAX) 50 MG tablet Take 1 tablet (50 mg total) by mouth every 4 (four) hours as needed for anxiety. 04/08/22  Yes [name], [name], MD   levothyroxine (SYNTHROID) 88 MCG tablet Take 1 tablet (88 mcg total) by mouth daily at 6 (six) AM. 04/09/22  Yes [name], [name], MD  lisinopril (ZESTRIL) 10 MG tablet Take 1 tablet (10 mg total) by mouth daily. 04/08/22  Yes [name], [name], MD  metFORMIN (GLUCOPHAGE) 1000 MG tablet Take 1 tablet (1,000 mg total) by mouth 2 (two) times daily with a meal. 04/08/22  Yes [name], [name], MD  OLANZapine (ZYPREXA) 20 MG tablet Take 1 tablet (20 mg total) by mouth at bedtime. 04/08/22  Yes [name], [name], MD  pioglitazone (ACTOS) 45 MG tablet Take 1 tablet (45 mg total) by mouth daily. 04/09/22  Yes [name], [name], MD  simvastatin (ZOCOR) 20 MG tablet Take 1 tablet (20 mg total) by mouth at bedtime. 04/08/22  Yes [name], [name], MD  traZODone (DESYREL) 150 MG tablet Take 1 tablet (150 mg total) by mouth at bedtime. 04/08/22  Yes [name], [name], MD    Allergies as of 08/16/2022 - Review Complete 08/01/2022  Allergen Reaction Noted   Phenytoin sodium extended Other (See Comments) and Nausea And Vomiting 12/31/2013   Prednisone Other (See Comments) 12/31/2013   Latex Hives, Nausea And Vomiting, and Rash 12/31/2013    Family History  Problem Relation Age of Onset   Heart disease Father    Heart attack Father     Social History  Socioeconomic History   Marital status: Single    Spouse name: Not on file   Number of children: Not on file   Years of education: Not on file   Highest education level: Not on file  Occupational History   Not on file  Tobacco Use   Smoking status: Never   Smokeless tobacco: Never  Vaping Use   Vaping Use: Never used  Substance and Sexual Activity   Alcohol use: No   Drug use: No   Sexual activity: Not Currently  Other Topics Concern   Not on file  Social History Narrative   Not on file   Social Determinants of Health   Financial Resource Strain: Not on file  Food Insecurity: Food Insecurity Present (04/09/2022)   Hunger Vital Sign    Worried About  Running Out of Food in the Last Year: Often true    Ran Out of Food in the Last Year: Often true  Transportation Needs: Unmet Transportation Needs (04/09/2022)   PRAPARE - Hydrologist (Medical): Yes    Lack of Transportation (Non-Medical): Yes  Physical Activity: Not on file  Stress: Not on file  Social Connections: Not on file  Intimate Partner Violence: Not At Risk (03/23/2022)   Humiliation, Afraid, Rape, and Kick questionnaire    Fear of Current or Ex-Partner: No    Emotionally Abused: No    Physically Abused: No    Sexually Abused: No    Review of Systems: See HPI, otherwise negative ROS  Physical Exam: BP 127/61   Temp (!) 97.3 F (36.3 C)   Ht 6' 2.02" (1.88 m)   Wt 106.6 kg   SpO2 99%   BMI 30.16 kg/m  General:   Alert,  pleasant and cooperative in NAD Head:  Normocephalic and atraumatic. Lungs:  Clear to auscultation.    Heart:  Regular rate and rhythm.   Impression/Plan: Marga Melnick is here for ophthalmic surgery.  Risks, benefits, limitations, and alternatives regarding ophthalmic surgery have been reviewed with the patient.  Questions have been answered.  All parties agreeable.   Leandrew Koyanagi, MD  09/15/2022, 10:22 AM

## 2022-09-15 NOTE — Transfer of Care (Signed)
Immediate Anesthesia Transfer of Care Note  Patient: Derrick Atkins  Procedure(s) Performed: CATARACT EXTRACTION PHACO AND INTRAOCULAR LENS PLACEMENT (IOC) RIGHT DIABETIC VISION BLUE HEALON 5  15.79  01:31.9 (Right: Eye)  Patient Location: PACU  Anesthesia Type: MAC  Level of Consciousness: awake, alert  and patient cooperative  Airway and Oxygen Therapy: Patient Spontanous Breathing and Patient connected to supplemental oxygen  Post-op Assessment: Post-op Vital signs reviewed, Patient's Cardiovascular Status Stable, Respiratory Function Stable, Patent Airway and No signs of Nausea or vomiting  Post-op Vital Signs: Reviewed and stable  Complications: No notable events documented.

## 2022-09-15 NOTE — Op Note (Signed)
OPERATIVE NOTE  LIZZIE DAGAN [id number] 09/15/2022   PREOPERATIVE DIAGNOSIS:  H25.89 Cataract    Mature (Total) Cataract Right Eye H25.89   POSTOPERATIVE DIAGNOSIS:same      PROCEDURE:  Phacoemusification with posterior chamber intraocular lens placement of the right eye .  Vision Blue dye was used to stain the lens capsule.  LENS:   Implant Name Type Inv. Item Serial No. Manufacturer Lot No. LRB No. Used Action  LENS IOL TECNIS EYHANCE 20.0 - [id number] Intraocular Lens LENS IOL TECNIS EYHANCE 20.0 [id number] [employer]  Right 1 Implanted       ULTRASOUND TIME:  1 minutes 32 seconds, CDE 15.8  SURGEON:  Wyonia Hough, MD   ANESTHESIA:  Topical with tetracaine drops augmented with 1% preservative-free intracameral lidocaine.   COMPLICATIONS:  None.   DESCRIPTION OF PROCEDURE: The patient was identified in the holding room and transported to the operating room and placed in the supine position under the operating microscope.  The right eye was identified as the operative eye and it was prepped and draped in the usual sterile ophthalmic fashion.  A 1 millimeter clear-corneal paracentesis was made at the 12:00 position.  0.5 ml of preservative-free 1% lidocaine was injected into the anterior chamber. The anterior chamber was filled with Healon 5 viscoelastic.    Vision Blue dye was then injected under the viscoelastic to stain the lens capsule.  BSS was then used to wash the dye out.  Additional Healon 5 was placed into the anterior chamber. A 2.4 millimeter keratome was used to make a near-clear corneal incision at the 9:00 position. A curvilinear capsulorrhexis was made with a cystotome and capsulorrhexis forceps.  Balanced salt solution was used to hydrodissect and hydrodelineate the nucleus.  Viscoat was then placed in the anterior chamber.   Phacoemulsification was then used in stop and chop fashion to remove the lens nucleus and epinucleus.  The remaining cortex was then  removed using the irrigation and aspiration handpiece. Provisc was then placed into the capsular bag to distend it for lens placement.  A 20.0 -diopter lens was then injected into the capsular bag.  The remaining viscoelastic was aspirated.   Wounds were hydrated with balanced salt solution.  The anterior chamber was inflated to a physiologic pressure with balanced salt solution. Cefuroxime 0.1 ml of a '10mg'$ /ml solution was injected into the anterior chamber for a dose of 1 mg of intracameral antibiotic at the completion of the case.  No wound leaks were noted.  Topical Timolol and Brimonidine drops were applied to the eye.  The patient was taken to the recovery room in stable condition without complications of anesthesia or surgery.  Lashe Oliveira 09/15/2022, 11:37 AM

## 2022-09-16 ENCOUNTER — Encounter: Payer: Self-pay | Admitting: Ophthalmology

## 2022-12-13 ENCOUNTER — Other Ambulatory Visit: Payer: Self-pay

## 2022-12-13 ENCOUNTER — Emergency Department: Payer: 59

## 2022-12-13 ENCOUNTER — Inpatient Hospital Stay
Admission: EM | Admit: 2022-12-13 | Discharge: 2022-12-23 | DRG: 571 | Disposition: A | Discharge status: Home / Self Care | Payer: 59 | Attending: Internal Medicine | Admitting: Internal Medicine

## 2022-12-13 DIAGNOSIS — Z8249 Family history of ischemic heart disease and other diseases of the circulatory system: Secondary | ICD-10-CM | POA: Diagnosis not present

## 2022-12-13 DIAGNOSIS — E039 Hypothyroidism, unspecified: Secondary | ICD-10-CM | POA: Diagnosis present

## 2022-12-13 DIAGNOSIS — E785 Hyperlipidemia, unspecified: Secondary | ICD-10-CM | POA: Insufficient documentation

## 2022-12-13 DIAGNOSIS — Z91040 Latex allergy status: Secondary | ICD-10-CM | POA: Diagnosis not present

## 2022-12-13 DIAGNOSIS — Z888 Allergy status to other drugs, medicaments and biological substances status: Secondary | ICD-10-CM

## 2022-12-13 DIAGNOSIS — Z9841 Cataract extraction status, right eye: Secondary | ICD-10-CM

## 2022-12-13 DIAGNOSIS — L03116 Cellulitis of left lower limb: Principal | ICD-10-CM | POA: Diagnosis present

## 2022-12-13 DIAGNOSIS — Z7982 Long term (current) use of aspirin: Secondary | ICD-10-CM

## 2022-12-13 DIAGNOSIS — E559 Vitamin D deficiency, unspecified: Secondary | ICD-10-CM | POA: Diagnosis present

## 2022-12-13 DIAGNOSIS — Z79899 Other long term (current) drug therapy: Secondary | ICD-10-CM

## 2022-12-13 DIAGNOSIS — D638 Anemia in other chronic diseases classified elsewhere: Secondary | ICD-10-CM | POA: Diagnosis present

## 2022-12-13 DIAGNOSIS — B9561 Methicillin susceptible Staphylococcus aureus infection as the cause of diseases classified elsewhere: Secondary | ICD-10-CM | POA: Diagnosis not present

## 2022-12-13 DIAGNOSIS — E119 Type 2 diabetes mellitus without complications: Secondary | ICD-10-CM

## 2022-12-13 DIAGNOSIS — M419 Scoliosis, unspecified: Secondary | ICD-10-CM | POA: Diagnosis present

## 2022-12-13 DIAGNOSIS — Z961 Presence of intraocular lens: Secondary | ICD-10-CM | POA: Diagnosis present

## 2022-12-13 DIAGNOSIS — Z79890 Hormone replacement therapy (postmenopausal): Secondary | ICD-10-CM

## 2022-12-13 DIAGNOSIS — D696 Thrombocytopenia, unspecified: Secondary | ICD-10-CM | POA: Diagnosis not present

## 2022-12-13 DIAGNOSIS — I10 Essential (primary) hypertension: Secondary | ICD-10-CM | POA: Diagnosis present

## 2022-12-13 DIAGNOSIS — L02416 Cutaneous abscess of left lower limb: Secondary | ICD-10-CM | POA: Diagnosis present

## 2022-12-13 DIAGNOSIS — F200 Paranoid schizophrenia: Secondary | ICD-10-CM | POA: Diagnosis present

## 2022-12-13 DIAGNOSIS — N179 Acute kidney failure, unspecified: Secondary | ICD-10-CM

## 2022-12-13 DIAGNOSIS — F32A Depression, unspecified: Secondary | ICD-10-CM | POA: Diagnosis present

## 2022-12-13 DIAGNOSIS — G809 Cerebral palsy, unspecified: Secondary | ICD-10-CM | POA: Diagnosis present

## 2022-12-13 DIAGNOSIS — Z794 Long term (current) use of insulin: Secondary | ICD-10-CM | POA: Diagnosis not present

## 2022-12-13 DIAGNOSIS — E860 Dehydration: Secondary | ICD-10-CM | POA: Diagnosis present

## 2022-12-13 LAB — COMPREHENSIVE METABOLIC PANEL
ALT: 11 U/L (ref 0–44)
AST: 19 U/L (ref 15–41)
Albumin: 3.1 g/dL — ABNORMAL LOW (ref 3.5–5.0)
Alkaline Phosphatase: 66 U/L (ref 38–126)
Anion gap: 11 (ref 5–15)
BUN: 36 mg/dL — ABNORMAL HIGH (ref 6–20)
CO2: 23 mmol/L (ref 22–32)
Calcium: 8.6 mg/dL — ABNORMAL LOW (ref 8.9–10.3)
Chloride: 106 mmol/L (ref 98–111)
Creatinine, Ser: 1.56 mg/dL — ABNORMAL HIGH (ref 0.61–1.24)
GFR, Estimated: 54 mL/min — ABNORMAL LOW (ref >60)
Glucose, Bld: 121 mg/dL — ABNORMAL HIGH (ref 70–99)
Potassium: 3.6 mmol/L (ref 3.5–5.1)
Sodium: 140 mmol/L (ref 135–145)
Total Bilirubin: 1.7 mg/dL — ABNORMAL HIGH (ref 0.3–1.2)
Total Protein: 6.1 g/dL — ABNORMAL LOW (ref 6.5–8.1)

## 2022-12-13 LAB — CBC WITH DIFFERENTIAL/PLATELET
Abs Immature Granulocytes: 0.17 10*3/uL — ABNORMAL HIGH (ref 0.00–0.07)
Basophils Absolute: 0 10*3/uL (ref 0.0–0.1)
Basophils Relative: 0 %
Eosinophils Absolute: 0 10*3/uL (ref 0.0–0.5)
Eosinophils Relative: 0 %
HCT: 32.7 % — ABNORMAL LOW (ref 39.0–52.0)
Hemoglobin: 10.5 g/dL — ABNORMAL LOW (ref 13.0–17.0)
Immature Granulocytes: 2 %
Lymphocytes Relative: 7 %
Lymphs Abs: 0.7 10*3/uL (ref 0.7–4.0)
MCH: 28.5 pg (ref 26.0–34.0)
MCHC: 32.1 g/dL (ref 30.0–36.0)
MCV: 88.9 fL (ref 80.0–100.0)
Monocytes Absolute: 1 10*3/uL (ref 0.1–1.0)
Monocytes Relative: 10 %
Neutro Abs: 8.5 10*3/uL — ABNORMAL HIGH (ref 1.7–7.7)
Neutrophils Relative %: 81 %
Platelets: 123 10*3/uL — ABNORMAL LOW (ref 150–400)
RBC: 3.68 MIL/uL — ABNORMAL LOW (ref 4.22–5.81)
RDW: 14.6 % (ref 11.5–15.5)
WBC: 10.5 10*3/uL (ref 4.0–10.5)
nRBC: 0 % (ref 0.0–0.2)

## 2022-12-13 LAB — PROTIME-INR
INR: 1.2 (ref 0.8–1.2)
Prothrombin Time: 15.9 seconds — ABNORMAL HIGH (ref 11.4–15.2)

## 2022-12-13 LAB — BRAIN NATRIURETIC PEPTIDE: B Natriuretic Peptide: 212.2 pg/mL — ABNORMAL HIGH (ref 0.0–100.0)

## 2022-12-13 LAB — LACTIC ACID, PLASMA
Lactic Acid, Venous: 1.5 mmol/L (ref 0.5–1.9)
Lactic Acid, Venous: 1.2 mmol/L (ref 0.5–1.9)

## 2022-12-13 LAB — CBG MONITORING, ED: Glucose-Capillary: 86 mg/dL (ref 70–99)

## 2022-12-13 MED ORDER — FENOFIBRATE 54 MG PO TABS
54.0000 mg | ORAL_TABLET | Freq: Every day | ORAL | Status: DC
  Administered 2022-12-14 – 2022-12-23 (×10): 54 mg via ORAL
  Filled 2022-12-13 (×10): qty 1

## 2022-12-13 MED ORDER — LIDOCAINE HCL (PF) 1 % IJ SOLN
20.0000 mL | Freq: Once | INTRAMUSCULAR | Status: DC
  Filled 2022-12-13: qty 20

## 2022-12-13 MED ORDER — MAGNESIUM HYDROXIDE 400 MG/5ML PO SUSP: 30.0000 mL | Freq: Every day | ORAL | Status: DC | PRN

## 2022-12-13 MED ORDER — INSULIN ASPART 100 UNIT/ML IJ SOLN
0.0000 [IU] | Freq: Three times a day (TID) | INTRAMUSCULAR | Status: DC
  Administered 2022-12-14 – 2022-12-18 (×5): 1 [IU] via SUBCUTANEOUS
  Administered 2022-12-18: 2 [IU] via SUBCUTANEOUS
  Administered 2022-12-20: 1 [IU] via SUBCUTANEOUS
  Administered 2022-12-20: 2 [IU] via SUBCUTANEOUS
  Administered 2022-12-21 (×2): 1 [IU] via SUBCUTANEOUS
  Administered 2022-12-23: 2 [IU] via SUBCUTANEOUS
  Filled 2022-12-13 (×12): qty 1

## 2022-12-13 MED ORDER — ASPIRIN 81 MG PO TBEC
81.0000 mg | DELAYED_RELEASE_TABLET | Freq: Every day | ORAL | Status: DC
  Administered 2022-12-14 – 2022-12-23 (×10): 81 mg via ORAL
  Filled 2022-12-13 (×10): qty 1

## 2022-12-13 MED ORDER — METOCLOPRAMIDE HCL 5 MG/ML IJ SOLN: 5.0000 mg | Freq: Four times a day (QID) | INTRAMUSCULAR | Status: DC | PRN

## 2022-12-13 MED ORDER — VANCOMYCIN HCL 2000 MG/400ML IV SOLN
2000.0000 mg | Freq: Once | INTRAVENOUS | Status: AC
  Administered 2022-12-13: 2000 mg via INTRAVENOUS
  Filled 2022-12-13: qty 400

## 2022-12-13 MED ORDER — ENOXAPARIN SODIUM 40 MG/0.4ML IJ SOSY
40.0000 mg | PREFILLED_SYRINGE | INTRAMUSCULAR | Status: DC
  Administered 2022-12-13 – 2022-12-22 (×10): 40 mg via SUBCUTANEOUS
  Filled 2022-12-13 (×10): qty 0.4

## 2022-12-13 MED ORDER — SODIUM CHLORIDE 0.9 % IV BOLUS
1000.0000 mL | Freq: Once | INTRAVENOUS | Status: AC
  Administered 2022-12-13: 1000 mL via INTRAVENOUS

## 2022-12-13 MED ORDER — PIOGLITAZONE HCL 30 MG PO TABS: 45.0000 mg | ORAL_TABLET | Freq: Every day | ORAL | Status: DC

## 2022-12-13 MED ORDER — GEMFIBROZIL 600 MG PO TABS
600.0000 mg | ORAL_TABLET | Freq: Two times a day (BID) | ORAL | Status: DC
  Administered 2022-12-14 – 2022-12-23 (×19): 600 mg via ORAL
  Filled 2022-12-13 (×20): qty 1

## 2022-12-13 MED ORDER — OLANZAPINE 10 MG PO TABS
20.0000 mg | ORAL_TABLET | Freq: Every day | ORAL | Status: DC
  Administered 2022-12-14: 20 mg via ORAL
  Filled 2022-12-13: qty 2

## 2022-12-13 MED ORDER — TRAZODONE HCL 50 MG PO TABS
150.0000 mg | ORAL_TABLET | Freq: Every day | ORAL | Status: DC
  Administered 2022-12-13: 150 mg via ORAL
  Filled 2022-12-13 (×2): qty 1

## 2022-12-13 MED ORDER — INSULIN ASPART 100 UNIT/ML IJ SOLN
0.0000 [IU] | Freq: Every day | INTRAMUSCULAR | Status: DC
  Administered 2022-12-13: 0 [IU] via SUBCUTANEOUS

## 2022-12-13 MED ORDER — BENZTROPINE MESYLATE 1 MG PO TABS
1.0000 mg | ORAL_TABLET | Freq: Every day | ORAL | Status: DC
  Administered 2022-12-14: 1 mg via ORAL
  Filled 2022-12-13: qty 1

## 2022-12-13 MED ORDER — SIMVASTATIN 20 MG PO TABS
20.0000 mg | ORAL_TABLET | Freq: Every day | ORAL | Status: DC
  Administered 2022-12-14 – 2022-12-22 (×10): 20 mg via ORAL
  Filled 2022-12-13 (×2): qty 1
  Filled 2022-12-13: qty 2
  Filled 2022-12-13 (×7): qty 1

## 2022-12-13 MED ORDER — ACETAMINOPHEN 650 MG RE SUPP: 650.0000 mg | Freq: Four times a day (QID) | RECTAL | Status: DC | PRN

## 2022-12-13 MED ORDER — TRAZODONE HCL 50 MG PO TABS: 25.0000 mg | ORAL_TABLET | Freq: Every evening | ORAL | Status: DC | PRN

## 2022-12-13 MED ORDER — SODIUM CHLORIDE 0.9 % IV SOLN: INTRAVENOUS | Status: DC

## 2022-12-13 MED ORDER — LEVOTHYROXINE SODIUM 88 MCG PO TABS
88.0000 ug | ORAL_TABLET | Freq: Every day | ORAL | Status: DC
  Administered 2022-12-14 – 2022-12-23 (×10): 88 ug via ORAL
  Filled 2022-12-13 (×10): qty 1

## 2022-12-13 MED ORDER — VANCOMYCIN HCL IN DEXTROSE 1-5 GM/200ML-% IV SOLN: 1000.0000 mg | Freq: Once | INTRAVENOUS | Status: DC

## 2022-12-13 MED ORDER — HYDROXYZINE HCL 25 MG PO TABS: 50.0000 mg | ORAL_TABLET | ORAL | Status: DC | PRN

## 2022-12-13 MED ORDER — ACETAMINOPHEN 325 MG PO TABS
650.0000 mg | ORAL_TABLET | Freq: Four times a day (QID) | ORAL | Status: DC | PRN
  Administered 2022-12-14 – 2022-12-23 (×3): 650 mg via ORAL
  Filled 2022-12-13 (×3): qty 2

## 2022-12-13 MED ORDER — DIVALPROEX SODIUM 500 MG PO DR TAB
750.0000 mg | DELAYED_RELEASE_TABLET | Freq: Two times a day (BID) | ORAL | Status: DC
  Administered 2022-12-13 – 2022-12-23 (×20): 750 mg via ORAL
  Filled 2022-12-13 (×20): qty 1

## 2022-12-13 NOTE — ED Triage Notes (Signed)
Arrives from [facility] group home via [facility].  Called for leg pain.  Patient seen by ?home health on Friday and was told he has cellulitis.  Antibiotics ordered, but have not arrived.  Today both legs are worsening.  Left leg worse than right, increased swelling and weeping. VS wnl.  Hx  Schizophrenia  Social worker will come to hospital.

## 2022-12-13 NOTE — Assessment & Plan Note (Addendum)
-   While holding off ACE inhibitor therapy due to AKI. - She will be placed on as needed IV labetalol.

## 2022-12-13 NOTE — ED Provider Triage Note (Signed)
Emergency Medicine Provider Triage Evaluation Note  Derrick Atkins , a 52 y.o. male  was evaluated in triage.  Pt complains of left lower leg swelling, redness, and weeping for several days.  Review of Systems  Positive:  Negative:   Physical Exam  BP 97/72   Pulse 95   Resp 18   Ht 6\' 4"  (1.93 m)   Wt 96.2 kg   SpO2 100%   BMI 25.81 kg/m  Gen:   Awake, no distress   Resp:  Normal effort  MSK:   Moves extremities without difficulty , redness noted on left lower leg from foot to knee, tender, wounds with drainage at lower part of leg, 2+ pulse in left foot noted Other:    Medical Decision Making  Medically screening exam initiated at 1:57 PM.  Appropriate orders placed.  Derrick Atkins was informed that the remainder of the evaluation will be completed by another provider, this initial triage assessment does not replace that evaluation, and the importance of remaining in the ED until their evaluation is complete.     Derrick Ghee, PA-C 12/13/22 1358

## 2022-12-13 NOTE — H&P (Signed)
Page   PATIENT NAME: Derrick Atkins    MR#:  [id number]  DATE OF BIRTH:  [birth year]  DATE OF ADMISSION:  12/13/2022  PRIMARY CARE PHYSICIAN: Gracelyn Nurse, MD   Patient is coming from: Home  REQUESTING/REFERRING PHYSICIAN: Pilar Jarvis, MD  CHIEF COMPLAINT:   Chief Complaint  Patient presents with   Leg Pain    HISTORY OF PRESENT ILLNESS:  Derrick Atkins is a 52 y.o. Caucasian male with medical history significant for type II diabetes mellitus, hypertension, schizoaffective disorder, cerebral palsy and depression, who presented to the emergency room with acute onset of worsening left lower extremity swelling with erythema and, tenderness and pain.  He stated that it has been going on over the last 6 months but got significantly worse over the last 6 days.  Did not notice any drainage though.  No fever but he experienced chills.  No chest pain or dyspnea or cough.  No nausea or vomiting or abdominal pain.  No dysuria, oliguria or hematuria or flank pain.  He did not experience significant elevation of his blood glucose levels.  ED Course: When the the ER, BP was 97/72 and later 126/59 with otherwise normal vital signs.  Labs revealed blood glucose of 121 with a BUN of 36 and creatinine 1.56 compared to 20/0.74 on 06/07/2022.  Calcium 8.6 and protein 6.1 albumin 3.1.  Total bili was 1.7 and GFR 54.  BNP was 212.2.  Lactic acid was 1.5.  CBC showed hemoglobin 10.5 hematocrit 32.7 slightly lower than previous levels.  Platelets were 123 compared to 139. EKG as reviewed by me : EKG showed normal sinus rhythm with a rate of 94 with right atrial enlargement, poor R wave progression and T wave inversion inferiorly Imaging: Left lower extremity venous Doppler came back negative for DVT.  It showed subcutaneous soft tissue swelling.  Left tib-fib x-ray showed no acute bony abnormality.  It showed diffuse edema in the subcutaneous plane.  The patient was given IV vancomycin and  had I&D by the ED physician and small abscess that was adequately drained.  He will be admitted to a medical bed for further evaluation and management. PAST MEDICAL HISTORY:   Past Medical History:  Diagnosis Date   Cerebral palsy (HCC)    Depression    Diabetes mellitus without complication (HCC)    Hypertension    Kidney stones    Schizoaffective disorder (HCC)    Scoliosis     PAST SURGICAL HISTORY:   Past Surgical History:  Procedure Laterality Date   BOWEL RESECTION     CATARACT EXTRACTION W/PHACO Right 09/15/2022   Procedure: CATARACT EXTRACTION PHACO AND INTRAOCULAR LENS PLACEMENT (IOC) RIGHT DIABETIC VISION BLUE HEALON 5  15.79  01:31.9;  Surgeon: Lockie Mola, MD;  Location: [facility];  Service: Ophthalmology;  Laterality: Right;  Latex Diabetic   LAPAROTOMY N/A 05/15/2020   Procedure: EXPLORATORY LAPAROTOMY;  Surgeon: Henrene Dodge, MD;  Location: [facility];  Service: General;  Laterality: N/A;    SOCIAL HISTORY:   Social History   Tobacco Use   Smoking status: Never   Smokeless tobacco: Never  Substance Use Topics   Alcohol use: No    FAMILY HISTORY:   Family History  Problem Relation Age of Onset   Heart disease Father    Heart attack Father     DRUG ALLERGIES:   Allergies  Allergen Reactions   Phenytoin Sodium Extended Other (See Comments) and Nausea And Vomiting  Prednisone Hives and Nausea And Vomiting   Latex Hives, Nausea And Vomiting and Rash    REVIEW OF SYSTEMS:   ROS As per history of present illness. All pertinent systems were reviewed above. Constitutional, HEENT, cardiovascular, respiratory, GI, GU, musculoskeletal, neuro, psychiatric, endocrine, integumentary and hematologic systems were reviewed and are otherwise negative/unremarkable except for positive findings mentioned above in the HPI.   MEDICATIONS AT HOME:   Prior to Admission medications   Medication Sig Start Date End Date Taking? Authorizing Provider   ABILIFY MAINTENA 400 MG PRSY prefilled syringe Inject 400 mg into the muscle every 28 (twenty-eight) days.   Yes [provider]  albuterol (VENTOLIN HFA) 108 (90 Base) MCG/ACT inhaler Inhale 1-2 puffs into the lungs every 4 (four) hours as needed. 08/16/22  Yes [provider]  aspirin EC 81 MG tablet Take 1 tablet (81 mg total) by mouth daily. Swallow whole. 04/09/22  Yes [name], [name], MD  benztropine (COGENTIN) 1 MG tablet Take 1 tablet (1 mg total) by mouth daily. Patient taking differently: Take 1 mg by mouth 2 (two) times daily. 04/08/22  Yes [name], [name], MD  busPIRone (BUSPAR) 15 MG tablet Take 15 mg by mouth 3 (three) times daily. 12/11/22  Yes [provider]  cholecalciferol (VITAMIN D3) 25 MCG (1000 UNIT) tablet Take 1,000 Units by mouth daily.   Yes [provider]  divalproex (DEPAKOTE) 250 MG DR tablet Take 3 tablets (750 mg total) by mouth every 12 (twelve) hours. 04/08/22  Yes [name], [name], MD  fenofibrate 54 MG tablet Take 1 tablet (54 mg total) by mouth daily. 04/08/22  Yes [name], [name], MD  gemfibrozil (LOPID) 600 MG tablet Take 1 tablet (600 mg total) by mouth 2 (two) times daily. 04/08/22  Yes [name], [name], MD  hydrOXYzine (ATARAX) 50 MG tablet Take 1 tablet (50 mg total) by mouth every 4 (four) hours as needed for anxiety. 04/08/22  Yes [name], [name], MD  levothyroxine (SYNTHROID) 88 MCG tablet Take 1 tablet (88 mcg total) by mouth daily at 6 (six) AM. 04/09/22  Yes [name], [name], MD  naproxen sodium (ALEVE) 220 MG tablet Take 220 mg by mouth 2 (two) times daily with a meal.   Yes [provider]  OLANZapine (ZYPREXA) 10 MG tablet Take 10 mg by mouth at bedtime.   Yes [provider]  OLANZapine (ZYPREXA) 20 MG tablet Take 1 tablet (20 mg total) by mouth at bedtime. 04/08/22  Yes [name], [name], MD  polyethylene glycol (MIRALAX / GLYCOLAX) 17 g packet Take 17 g by mouth daily.   Yes [provider]   simvastatin (ZOCOR) 20 MG tablet Take 1 tablet (20 mg total) by mouth at bedtime. 04/08/22  Yes [name], [name], MD  zolpidem (AMBIEN) 5 MG tablet Take 5 mg by mouth at bedtime as needed. 10/29/22  Yes [provider]  furosemide (LASIX) 20 MG tablet Take 20 mg by mouth daily. Patient not taking: Reported on 12/13/2022 05/06/22 05/06/23  [provider]  lisinopril (ZESTRIL) 10 MG tablet Take 1 tablet (10 mg total) by mouth daily. Patient not taking: Reported on 12/13/2022 04/08/22   [name], [name], MD  metFORMIN (GLUCOPHAGE) 1000 MG tablet Take 1 tablet (1,000 mg total) by mouth 2 (two) times daily with a meal. Patient not taking: Reported on 12/13/2022 04/08/22   [name], [name], MD  pioglitazone (ACTOS) 45 MG tablet Take 1 tablet (45 mg total) by mouth daily.  Patient not taking: Reported on 12/13/2022 04/09/22   [name], [name], MD  sulfamethoxazole-trimethoprim (BACTRIM DS) 800-160 MG tablet Take 1 tablet by mouth 2 (two) times daily. Patient not taking: Reported on 12/13/2022 12/10/22   [provider]  traZODone (DESYREL) 150 MG tablet Take 1 tablet (150 mg total) by mouth at bedtime. Patient not taking: Reported on 12/13/2022 04/08/22   [name], [name], MD      VITAL SIGNS:  Blood pressure (!) 126/59, pulse 99, temperature 98.2 F (36.8 C), temperature source Oral, resp. rate 15, height 6\' 4"  (1.93 m), weight 96.2 kg, SpO2 (!) 1 %.  PHYSICAL EXAMINATION:  Physical Exam  GENERAL:  52 y.o.-year-old Caucasian male patient lying in the bed with no acute distress.  EYES: Pupils equal, round, reactive to light and accommodation. No scleral icterus. Extraocular muscles intact.  HEENT: Head atraumatic, normocephalic. Oropharynx and nasopharynx clear.  NECK:  Supple, no jugular venous distention. No thyroid enlargement, no tenderness.  LUNGS: Normal breath sounds bilaterally, no wheezing, rales,rhonchi or crepitation. No use of accessory muscles of respiration.  CARDIOVASCULAR:  Regular rate and rhythm, S1, S2 normal. No murmurs, rubs, or gallops.  ABDOMEN: Soft, nondistended, nontender. Bowel sounds present. No organomegaly or mass.  EXTREMITIES: No pedal edema, cyanosis, or clubbing.  NEUROLOGIC: Cranial nerves II through XII are intact. Muscle strength 5/5 in all extremities. Sensation intact. Gait not checked.  PSYCHIATRIC: The patient is alert and oriented x 3.  Normal affect and good eye contact. SKIN: Left leg and ankle erythema with induration, warmth, tenderness s/p I&D.  LABORATORY PANEL:   CBC Recent Labs  Lab 12/13/22 1358  WBC 10.5  HGB 10.5*  HCT 32.7*  PLT 123*   ------------------------------------------------------------------------------------------------------------------  Chemistries  Recent Labs  Lab 12/13/22 1358  NA 140  K 3.6  CL 106  CO2 23  GLUCOSE 121*  BUN 36*  CREATININE 1.56*  CALCIUM 8.6*  AST 19  ALT 11  ALKPHOS 66  BILITOT 1.7*   ------------------------------------------------------------------------------------------------------------------  Cardiac Enzymes No results for input(s): "TROPONINI" in the last 168 hours. ------------------------------------------------------------------------------------------------------------------  RADIOLOGY:  US Venous Img Lower Unilateral Left  Result Date: 12/13/2022 CLINICAL DATA:  Left lower extremity pain, redness, and swelling EXAM: LEFT LOWER EXTREMITY VENOUS DOPPLER ULTRASOUND TECHNIQUE: Gray-scale sonography with compression, as well as color and duplex ultrasound, were performed to evaluate the deep venous system(s) from the level of the common femoral vein through the popliteal and proximal calf veins. COMPARISON:  None Available. FINDINGS: VENOUS Normal compressibility of the common femoral, superficial femoral, and popliteal veins, as well as the visualized calf veins. Visualized portions of profunda femoral vein and great saphenous vein unremarkable. No filling  defects to suggest DVT on grayscale or color Doppler imaging. Doppler waveforms show normal direction of venous flow, normal respiratory plasticity and response to augmentation. Limited views of the contralateral common femoral vein are unremarkable. OTHER Subcutaneous soft tissue swelling overlying the popliteal region and calf. Limitations: none IMPRESSION: Negative. Electronically Signed   By: Agustin Cree M.D.   On: 12/13/2022 16:38   DG Tibia/Fibula Left  Result Date: 12/13/2022 CLINICAL DATA:  Pain EXAM: LEFT TIBIA AND FIBULA - 2 VIEW COMPARISON:  None Available. FINDINGS: No fracture or dislocation is seen. There are no focal lytic lesions. There is no effusion in the left knee. There is edema in subcutaneous plane. IMPRESSION: No radiographic abnormality is seen in bony structures in left lower leg. There is diffuse edema in subcutaneous plane. Electronically Signed  By: Ernie Avena M.D.   On: 12/13/2022 15:06      IMPRESSION AND PLAN:  Assessment and Plan: * Cellulitis and abscess of left leg - The patient will be admitted to a medical bed. - We will continue antibiotic therapy with IV vancomycin and Rocephin. - Warm compresses will be applied. - We will follow blood cultures.  AKI (acute kidney injury) (HCC) - This is likely prerenal likely related to volume depletion and mild dehydration. - He will be hydrated with IV normal saline and will follow BMP. - Will avoid nephrotoxins.  Type 2 diabetes mellitus without complications (HCC) - The patient will be placed on supplement coverage with NovoLog. - We will hold off metformin for now.  Dyslipidemia - We will continue statin therapy.  Hypothyroidism - We will continue Synthroid.  Schizophrenia, paranoid, chronic (HCC) - We will continue his Zyprexa and Cogentin. - He is onMonthly monthly Abilify injections.  Essential hypertension - While holding off ACE inhibitor therapy due to AKI. - She will be placed on as  needed IV labetalol.   DVT prophylaxis: Lovenox.  Advanced Care Planning:  Code Status: full code.  Family Communication:  The plan of care was discussed in details with the patient (and family). I answered all questions. The patient agreed to proceed with the above mentioned plan. Further management will depend upon hospital course. Disposition Plan: Back to previous home environment Consults called: none.  All the records are reviewed and case discussed with ED provider.  Status is: Inpatient   At the time of the admission, it appears that the appropriate admission status for this patient is inpatient.  This is judged to be reasonable and necessary in order to provide the required intensity of service to ensure the patient's safety given the presenting symptoms, physical exam findings and initial radiographic and laboratory data in the context of comorbid conditions.  The patient requires inpatient status due to high intensity of service, high risk of further deterioration and high frequency of surveillance required.  I certify that at the time of admission, it is my clinical judgment that the patient will require inpatient hospital care extending more than 2 midnights.                            Dispo: The patient is from: Home              Anticipated d/c is to: Home              Patient currently is not medically stable to d/c.              Difficult to place patient: No  Hannah Beat M.D on 12/13/2022 at 10:51 PM  [facility]   From 7 PM-7 AM, contact night-coverage [email or web address]  CC: Primary care physician; Gracelyn Nurse, MD

## 2022-12-13 NOTE — Consult Note (Signed)
PHARMACY -  BRIEF ANTIBIOTIC NOTE   Pharmacy has received consult(s) for Vancomycin from an ED provider.  The patient's profile has been reviewed for ht/wt/allergies/indication/available labs.    One time order(s) placed for Vancomycin 2g IV.  Further antibiotics/pharmacy consults should be ordered by admitting physician if indicated.                       Thank you, Bettey Costa 12/13/2022  6:28 PM

## 2022-12-13 NOTE — Assessment & Plan Note (Signed)
The patient will be placed on supplement coverage with NovoLog. ?We will hold off metformin for now. ?

## 2022-12-13 NOTE — ED Provider Notes (Signed)
[facility] Provider Note    Event Date/Time   First MD Initiated Contact with Patient 12/13/22 1807     (approximate)   History   Leg Pain   HPI  Derrick Atkins is a 52 y.o. male   Past medical history of schizoaffective, cerebral palsy, diabetes, who presents to the emergency department with left lower extremity swelling redness and pain.  Developed over the last 1 week.  No trauma.  No systemic symptoms like fevers or chills.  Denies chest pain or shortness of breath.  He has no history of blood clots   External Medical Documents Reviewed: Office visit from [facility] internal medicine dated January 2024 is noted to have bilateral equal lower extremity edema with redness      Physical Exam   Triage Vital Signs: ED Triage Vitals  Enc Vitals Group     BP 12/13/22 1355 97/72     Pulse Rate 12/13/22 1355 95     Resp 12/13/22 1355 18     Temp 12/13/22 1359 97.7 F (36.5 C)     Temp src --      SpO2 12/13/22 1355 100 %     Weight 12/13/22 1356 212 lb (96.2 kg)     Height 12/13/22 1356 6\' 4"  (1.93 m)     Head Circumference --      Peak Flow --      Pain Score 12/13/22 1356 7     Pain Loc --      Pain Edu? --      Excl. in GC? --     Most recent vital signs: Vitals:   12/13/22 1819 12/13/22 1820  BP: (!) 126/59   Pulse:  87  Resp:    Temp:    SpO2:  100%    General: Awake, no distress.  CV:  Good peripheral perfusion.  Resp:  Normal effort.  Abd:  No distention.  Other:  Left leg cellulitis swelling from the ankles to the inferior knee circumferential with some sloughing skin around the anterior ankle and a small area of fluctuance.  Neurovascular intact - no pain out of proportion or crepitus.   ED Results / Procedures / Treatments   Labs (all labs ordered are listed, but only abnormal results are displayed) Labs Reviewed  COMPREHENSIVE METABOLIC PANEL - Abnormal; Notable for the following components:      Result  Value   Glucose, Bld 121 (*)    BUN 36 (*)    Creatinine, Ser 1.56 (*)    Calcium 8.6 (*)    Total Protein 6.1 (*)    Albumin 3.1 (*)    Total Bilirubin 1.7 (*)    GFR, Estimated 54 (*)    All other components within normal limits  BRAIN NATRIURETIC PEPTIDE - Abnormal; Notable for the following components:   B Natriuretic Peptide 212.2 (*)    All other components within normal limits  CBC WITH DIFFERENTIAL/PLATELET - Abnormal; Notable for the following components:   RBC 3.68 (*)    Hemoglobin 10.5 (*)    HCT 32.7 (*)    Platelets 123 (*)    Neutro Abs 8.5 (*)    Abs Immature Granulocytes 0.17 (*)    All other components within normal limits  PROTIME-INR - Abnormal; Notable for the following components:   Prothrombin Time 15.9 (*)    All other components within normal limits  AEROBIC/ANAEROBIC CULTURE W GRAM STAIN (SURGICAL/DEEP WOUND)  LACTIC ACID, PLASMA  LACTIC  ACID, PLASMA  URINALYSIS, ROUTINE W REFLEX MICROSCOPIC     I ordered and reviewed the above labs they are notable for white blood cell count is normal  EKG  ED ECG REPORT I, Pilar Jarvis, the attending physician, personally viewed and interpreted this ECG.   Date: 12/13/2022  EKG Time: 1407  Rate: 94  Rhythm: sinus  Intervals:none  ST&T Change: no stemi    RADIOLOGY I independently reviewed and interpreted x-ray of the tibia and I see no bony erosive changes   PROCEDURES:  Critical Care performed: No  ..Incision and Drainage  Date/Time: 12/13/2022 7:43 PM  Performed by: Pilar Jarvis, MD Authorized by: Pilar Jarvis, MD   Consent:    Consent obtained:  Verbal   Consent given by:  Patient   Risks discussed:  Incomplete drainage, infection and damage to other organs   Alternatives discussed:  No treatment and delayed treatment Universal protocol:    Procedure explained and questions answered to patient or proxy's satisfaction: yes     Patient identity confirmed:  Verbally with patient Location:     Type:  Abscess   Size:  4   Location:  Lower extremity   Lower extremity location:  Leg   Leg location:  L lower leg Pre-procedure details:    Skin preparation:  Chlorhexidine with alcohol Sedation:    Sedation type:  None Anesthesia:    Anesthesia method:  Local infiltration   Local anesthetic:  Lidocaine 1% w/o epi Procedure type:    Complexity:  Complex Procedure details:    Ultrasound guidance: yes     Needle aspiration: no     Incision types:  Stab incision   Incision depth:  Dermal   Wound management:  Probed and deloculated   Drainage:  Purulent   Drainage amount:  Moderate   Wound treatment:  Wound left open   Packing materials:  None Post-procedure details:    Procedure completion:  Tolerated    MEDICATIONS ORDERED IN ED: Medications  sodium chloride 0.9 % bolus 1,000 mL (has no administration in time range)  lidocaine (PF) (XYLOCAINE) 1 % injection 20 mL (has no administration in time range)  vancomycin (VANCOREADY) IVPB 2000 mg/400 mL (has no administration in time range)    External physician / consultants:  I spoke with hospitalist for admit and  regarding care plan for this patient.   IMPRESSION / MDM / ASSESSMENT AND PLAN / ED COURSE  I reviewed the triage vital signs and the nursing notes.                                Patient's presentation is most consistent with acute presentation with potential threat to life or bodily function.  Differential diagnosis includes, but is not limited to, abscess, cellulitis, necrotizing fasciitis, DVT, septic joint   The patient is on the cardiac monitor to evaluate for evidence of arrhythmia and/or significant heart rate changes.  MDM: Abscess and cellulitis low clinical suspicion for septic joint given inability to range, no crepitus or pain out of proportion or toxic appearance to suggest necrotizing fasciitis.  Abscess drained as above, admit for IV vancomycin cellulitis given the extent of his  infection.         FINAL CLINICAL IMPRESSION(S) / ED DIAGNOSES   Final diagnoses:  Cellulitis and abscess of left leg     Rx / DC Orders   ED Discharge Orders  None        Note:  This document was prepared using Dragon voice recognition software and may include unintentional dictation errors.    Pilar Jarvis, MD 12/13/22 [phone]

## 2022-12-13 NOTE — Assessment & Plan Note (Signed)
-   The patient will be admitted to a medical bed. - We will continue antibiotic therapy with IV vancomycin and Rocephin. - Warm compresses will be applied. - We will follow blood cultures.

## 2022-12-13 NOTE — Assessment & Plan Note (Signed)
-   This is likely prerenal likely related to volume depletion and mild dehydration. - He will be hydrated with IV normal saline and will follow BMP. - Will avoid nephrotoxins.

## 2022-12-13 NOTE — Assessment & Plan Note (Signed)
-   We will continue Synthroid. 

## 2022-12-13 NOTE — Assessment & Plan Note (Signed)
-   We will continue his Zyprexa and Cogentin. - He is onMonthly monthly Abilify injections.

## 2022-12-13 NOTE — Assessment & Plan Note (Signed)
-   We will continue statin therapy. 

## 2022-12-13 NOTE — ED Triage Notes (Signed)
Pt to ED [facility] with social worker for left leg pain worsening over the past couple days. Redness and swelling noted.  Social worker reports DSS is legal guardian and is aware pt is here.

## 2022-12-14 DIAGNOSIS — L02416 Cutaneous abscess of left lower limb: Secondary | ICD-10-CM | POA: Diagnosis not present

## 2022-12-14 DIAGNOSIS — L03116 Cellulitis of left lower limb: Secondary | ICD-10-CM | POA: Diagnosis not present

## 2022-12-14 LAB — CBG MONITORING, ED
Glucose-Capillary: 103 mg/dL — ABNORMAL HIGH (ref 70–99)
Glucose-Capillary: 143 mg/dL — ABNORMAL HIGH (ref 70–99)

## 2022-12-14 LAB — CBC
HCT: 23.8 % — ABNORMAL LOW (ref 39.0–52.0)
HCT: 27.4 % — ABNORMAL LOW (ref 39.0–52.0)
Hemoglobin: 7.7 g/dL — ABNORMAL LOW (ref 13.0–17.0)
Hemoglobin: 8.8 g/dL — ABNORMAL LOW (ref 13.0–17.0)
MCH: 28.3 pg (ref 26.0–34.0)
MCH: 28.1 pg (ref 26.0–34.0)
MCHC: 32.4 g/dL (ref 30.0–36.0)
MCHC: 32.1 g/dL (ref 30.0–36.0)
MCV: 87.5 fL (ref 80.0–100.0)
MCV: 87.5 fL (ref 80.0–100.0)
Platelets: 95 10*3/uL — ABNORMAL LOW (ref 150–400)
Platelets: 97 10*3/uL — ABNORMAL LOW (ref 150–400)
RBC: 2.72 MIL/uL — ABNORMAL LOW (ref 4.22–5.81)
RBC: 3.13 MIL/uL — ABNORMAL LOW (ref 4.22–5.81)
RDW: 14.4 % (ref 11.5–15.5)
RDW: 14.6 % (ref 11.5–15.5)
WBC: 6.2 10*3/uL (ref 4.0–10.5)
WBC: 7.1 10*3/uL (ref 4.0–10.5)
nRBC: 0 % (ref 0.0–0.2)
nRBC: 0 % (ref 0.0–0.2)

## 2022-12-14 LAB — URINALYSIS, ROUTINE W REFLEX MICROSCOPIC
Bilirubin Urine: NEGATIVE
Glucose, UA: NEGATIVE mg/dL
Hgb urine dipstick: NEGATIVE
Ketones, ur: NEGATIVE mg/dL
Leukocytes,Ua: NEGATIVE
Nitrite: NEGATIVE
Protein, ur: NEGATIVE mg/dL
Specific Gravity, Urine: 1.023 (ref 1.005–1.030)
pH: 5 (ref 5.0–8.0)

## 2022-12-14 LAB — BASIC METABOLIC PANEL
Anion gap: 11 (ref 5–15)
BUN: 32 mg/dL — ABNORMAL HIGH (ref 6–20)
CO2: 21 mmol/L — ABNORMAL LOW (ref 22–32)
Calcium: 7.9 mg/dL — ABNORMAL LOW (ref 8.9–10.3)
Chloride: 107 mmol/L (ref 98–111)
Creatinine, Ser: 1.37 mg/dL — ABNORMAL HIGH (ref 0.61–1.24)
GFR, Estimated: >60 mL/min (ref >60)
Glucose, Bld: 138 mg/dL — ABNORMAL HIGH (ref 70–99)
Potassium: 3.4 mmol/L — ABNORMAL LOW (ref 3.5–5.1)
Sodium: 139 mmol/L (ref 135–145)

## 2022-12-14 LAB — GLUCOSE, CAPILLARY
Glucose-Capillary: 110 mg/dL — ABNORMAL HIGH (ref 70–99)
Glucose-Capillary: 98 mg/dL (ref 70–99)

## 2022-12-14 MED ORDER — OXYCODONE HCL 5 MG PO TABS
5.0000 mg | ORAL_TABLET | Freq: Four times a day (QID) | ORAL | Status: DC | PRN
  Administered 2022-12-14 – 2022-12-22 (×6): 5 mg via ORAL
  Filled 2022-12-14 (×6): qty 1

## 2022-12-14 MED ORDER — MORPHINE SULFATE (PF) 2 MG/ML IV SOLN
2.0000 mg | Freq: Once | INTRAVENOUS | Status: AC
  Administered 2022-12-14: 2 mg via INTRAVENOUS

## 2022-12-14 MED ORDER — OLANZAPINE 10 MG PO TABS
30.0000 mg | ORAL_TABLET | Freq: Every day | ORAL | Status: DC
  Administered 2022-12-14 – 2022-12-22 (×9): 30 mg via ORAL
  Filled 2022-12-14 (×9): qty 3

## 2022-12-14 MED ORDER — BENZTROPINE MESYLATE 0.5 MG PO TABS
1.0000 mg | ORAL_TABLET | Freq: Two times a day (BID) | ORAL | Status: DC
  Administered 2022-12-14 – 2022-12-23 (×18): 1 mg via ORAL
  Filled 2022-12-14 (×19): qty 2

## 2022-12-14 MED ORDER — BUSPIRONE HCL 10 MG PO TABS
15.0000 mg | ORAL_TABLET | Freq: Three times a day (TID) | ORAL | Status: DC
  Administered 2022-12-14 – 2022-12-23 (×29): 15 mg via ORAL
  Filled 2022-12-14 (×20): qty 2
  Filled 2022-12-14: qty 3
  Filled 2022-12-14 (×8): qty 2

## 2022-12-14 MED ORDER — MORPHINE SULFATE (PF) 2 MG/ML IV SOLN
2.0000 mg | Freq: Once | INTRAVENOUS | Status: DC
  Filled 2022-12-14: qty 1

## 2022-12-14 MED ORDER — VANCOMYCIN HCL 1250 MG/250ML IV SOLN
1250.0000 mg | Freq: Two times a day (BID) | INTRAVENOUS | Status: DC
  Administered 2022-12-14 – 2022-12-15 (×4): 1250 mg via INTRAVENOUS
  Filled 2022-12-14 (×5): qty 250

## 2022-12-14 NOTE — Consult Note (Signed)
Pharmacy Antibiotic Note  Derrick Atkins is a 52 y.o. male admitted on 12/13/2022 with cellulitis.  Pharmacy has been consulted for vancomycin dosing.  Vancomycin 2000 mg IV x 1 received 6/3 @ 2248  Scr improving 1.56>>1.37  Plan: Start vancomycin 1250 mg IV every 12 hours Estimated AUC 514.6, Cmin 15.2 Wt 96.2 kg, Scr 1.37, Vd coefficient 0.72  Vancomycin levels at steady state or as clinically indicated Follow renal function for needed adjustments  Height: 6\' 4"  (193 cm) Weight: 96.2 kg (212 lb) IBW/kg (Calculated) : 86.8  Temp (24hrs), Avg:98 F (36.7 C), Min:97.7 F (36.5 C), Max:98.2 F (36.8 C)  Recent Labs  Lab 12/13/22 1358 12/13/22 2232 12/14/22 0438  WBC 10.5  --  6.2  CREATININE 1.56*  --  1.37*  LATICACIDVEN 1.5 1.2  --     Estimated Creatinine Clearance: 79.2 mL/min (A) (by C-G formula based on SCr of 1.37 mg/dL (H)).    Allergies  Allergen Reactions   Phenytoin Sodium Extended Other (See Comments) and Nausea And Vomiting   Prednisone Hives and Nausea And Vomiting   Latex Hives, Nausea And Vomiting and Rash    Antimicrobials this admission: vancomycin 6/3 >>   Dose adjustments this admission: N/A  Microbiology results: 6/4 BCx: pending 6/4 Wound culture: pending  Thank you for allowing pharmacy to be a part of this patient's care.  Barrie Folk, PharmD 12/14/2022 10:03 AM

## 2022-12-14 NOTE — ED Notes (Signed)
L ankle culture swab recollected by MD, blue swab sent to lab.

## 2022-12-14 NOTE — ED Notes (Signed)
MD at [facility]. L ankle wound expressed, purulent drainage present, culture sent.

## 2022-12-14 NOTE — Consult Note (Signed)
WOC Nurse Consult Note: Reason for Consult: LLE abscess Seen in ED and I&D has been performed, see ED note. History of schizophrenia, DM, CP. From group home.  Wound type:abscess/infectious  Pressure Injury POA: NA Measurement: see ED MD notes Wound bed:NA Drainage (amount, consistency, odor) NA Periwound:erythema  Dressing procedure/placement/frequency: Cleanse with saline, using syringe to flush Pack with 1/4 iodoform packing strip, to serve as wick for wound, top with foam. Change every other day.   Re consult if needed, will not follow at this time. Thanks  Terrianne Cavness [employer], RN,CWOCN, CNS, CWON-AP [phone])

## 2022-12-14 NOTE — Progress Notes (Addendum)
PROGRESS NOTE    Derrick Atkins  ZOX:[id number] DOB: 08-30-1970 DOA: 12/13/2022 PCP: Gracelyn Nurse, MD     Brief Narrative:   Derrick Atkins is a 52 y.o. Caucasian male with medical history significant for type II diabetes mellitus, hypertension, schizoaffective disorder, cerebral palsy and depression, who presented to the emergency room with acute onset of worsening left lower extremity swelling with erythema and, tenderness and pain.  He stated that it has been going on over the last 6 months but got significantly worse over the last 6 days.  Did not notice any drainage though.  No fever but he experienced chills.  No chest pain or dyspnea or cough.  No nausea or vomiting or abdominal pain.  No dysuria, oliguria or hematuria or flank pain.  He did not experience significant elevation of his blood glucose levels.    Assessment & Plan:   Principal Problem:   Cellulitis and abscess of left leg Active Problems:   Type 2 diabetes mellitus without complications (HCC)   AKI (acute kidney injury) (HCC)   Essential hypertension   Schizophrenia, paranoid, chronic (HCC)   Hypothyroidism   Dyslipidemia  # Abscess with cellulitis LLE. No DVT on PVL. S/p I and D in the ED with report of marked purulence noted. Today I was able to express a large amount of purulent bloody fluid from the 1 cm stab incision just above the left ankle. Culture ordered by ED provider but appears was never collected and/or sent. Patient had a one-time dose of vancomycin but further antibiotics have not been ordered - I will order vancomycin - I've collected and sent a culture - I will order blood cultures though note this is after start of IV antibiotics - would involve general surgery if does not improve over the next day or so  # AKI Likely prerenal, responding to fluids, 1.56 on presentation, 1.37 today - continue gentle fluids  # Guardianship Candice Abran Cantor is listed as patient's legal guardian, with  DSS, patient is a ward of the state. I updated her today. Patient resides in a group home. Derrick Atkins is the Child psychotherapist (also listed in the demographics tab) who works with the patient most closely.   # T2DM Normoglycemic here - SSI  # Hypothyroid - home levothyroxine  # Schizophrenia Without behavioral disturbance - cont home zyprexa and buspar and cogentin, is also on monthly abilify IM at home  # HTN Here bp wnl - hold home antihypertensives   DVT prophylaxis: lovenox Code Status: full Family Communication: none  Level of care: Med-Surg Status is: Inpatient Remains inpatient appropriate because: need for IV abx and inpatient monitoring    Consultants:  none  Procedures: I and D int he ER  Antimicrobials:  vancomycin    Subjective: Right leg pain otherwise feeling well  Objective: Vitals:   12/14/22 0710 12/14/22 0715 12/14/22 0720 12/14/22 0737  BP:      Pulse: 67 69 76   Resp: 18 19 17    Temp:    98 F (36.7 C)  TempSrc:    Oral  SpO2: 100% 100% 100%   Weight:      Height:        Intake/Output Summary (Last 24 hours) at 12/14/2022 0939 Last data filed at 12/14/2022 0114 Gross per 24 hour  Intake 1400 ml  Output --  Net 1400 ml   Filed Weights   12/13/22 1356  Weight: 96.2 kg    Examination:  General exam:  Appears calm and comfortable  Respiratory system: Clear to auscultation. Respiratory effort normal. Cardiovascular system: S1 & S2 heard, RRR. No JVD, murmurs, rubs, gallops or clicks. No pedal edema. Gastrointestinal system: Abdomen is nondistended, soft and nontender. No organomegaly or masses felt. Normal bowel sounds heard. Central nervous system: Alert and oriented. No focal neurological deficits. Extremities: Symmetric 5 x 5 power. Skin:  Psychiatry: Judgement and insight appear normal. Mood & affect appropriate.     Data Reviewed: I have personally reviewed following labs and imaging studies  CBC: Recent Labs   Lab 12/13/22 1358 12/14/22 0438  WBC 10.5 6.2  NEUTROABS 8.5*  --   HGB 10.5* 7.7*  HCT 32.7* 23.8*  MCV 88.9 87.5  PLT 123* 95*   Basic Metabolic Panel: Recent Labs  Lab 12/13/22 1358 12/14/22 0438  NA 140 139  K 3.6 3.4*  CL 106 107  CO2 23 21*  GLUCOSE 121* 138*  BUN 36* 32*  CREATININE 1.56* 1.37*  CALCIUM 8.6* 7.9*   GFR: Estimated Creatinine Clearance: 79.2 mL/min (A) (by C-G formula based on SCr of 1.37 mg/dL (H)). Liver Function Tests: Recent Labs  Lab 12/13/22 1358  AST 19  ALT 11  ALKPHOS 66  BILITOT 1.7*  PROT 6.1*  ALBUMIN 3.1*   No results for input(s): "LIPASE", "AMYLASE" in the last 168 hours. No results for input(s): "AMMONIA" in the last 168 hours. Coagulation Profile: Recent Labs  Lab 12/13/22 1358  INR 1.2   Cardiac Enzymes: No results for input(s): "CKTOTAL", "CKMB", "CKMBINDEX", "TROPONINI" in the last 168 hours. BNP (last 3 results) No results for input(s): "PROBNP" in the last 8760 hours. HbA1C: No results for input(s): "HGBA1C" in the last 72 hours. CBG: Recent Labs  Lab 12/13/22 2259 12/14/22 0735  GLUCAP 86 103*   Lipid Profile: No results for input(s): "CHOL", "HDL", "LDLCALC", "TRIG", "CHOLHDL", "LDLDIRECT" in the last 72 hours. Thyroid Function Tests: No results for input(s): "TSH", "T4TOTAL", "FREET4", "T3FREE", "THYROIDAB" in the last 72 hours. Anemia Panel: No results for input(s): "VITAMINB12", "FOLATE", "FERRITIN", "TIBC", "IRON", "RETICCTPCT" in the last 72 hours. Urine analysis:    Component Value Date/Time   COLORURINE AMBER (A) 12/14/2022 0437   APPEARANCEUR CLEAR (A) 12/14/2022 0437   APPEARANCEUR Clear 12/14/2012 0619   LABSPEC 1.023 12/14/2022 0437   LABSPEC 1.030 12/14/2012 0619   PHURINE 5.0 12/14/2022 0437   GLUCOSEU NEGATIVE 12/14/2022 0437   GLUCOSEU 50 mg/dL 16/04/9603 5409   HGBUR NEGATIVE 12/14/2022 0437   BILIRUBINUR NEGATIVE 12/14/2022 0437   BILIRUBINUR Negative 12/14/2012 0619    KETONESUR NEGATIVE 12/14/2022 0437   PROTEINUR NEGATIVE 12/14/2022 0437   NITRITE NEGATIVE 12/14/2022 0437   LEUKOCYTESUR NEGATIVE 12/14/2022 0437   LEUKOCYTESUR Negative 12/14/2012 0619   Sepsis Labs: @LABRCNTIP (procalcitonin:4,lacticidven:4)  )No results found for this or any previous visit (from the past 240 hour(s)).       Radiology Studies: US Venous Img Lower Unilateral Left  Result Date: 12/13/2022 CLINICAL DATA:  Left lower extremity pain, redness, and swelling EXAM: LEFT LOWER EXTREMITY VENOUS DOPPLER ULTRASOUND TECHNIQUE: Gray-scale sonography with compression, as well as color and duplex ultrasound, were performed to evaluate the deep venous system(s) from the level of the common femoral vein through the popliteal and proximal calf veins. COMPARISON:  None Available. FINDINGS: VENOUS Normal compressibility of the common femoral, superficial femoral, and popliteal veins, as well as the visualized calf veins. Visualized portions of profunda femoral vein and great saphenous vein unremarkable. No filling defects to suggest DVT on  grayscale or color Doppler imaging. Doppler waveforms show normal direction of venous flow, normal respiratory plasticity and response to augmentation. Limited views of the contralateral common femoral vein are unremarkable. OTHER Subcutaneous soft tissue swelling overlying the popliteal region and calf. Limitations: none IMPRESSION: Negative. Electronically Signed   By: Agustin Cree M.D.   On: 12/13/2022 16:38   DG Tibia/Fibula Left  Result Date: 12/13/2022 CLINICAL DATA:  Pain EXAM: LEFT TIBIA AND FIBULA - 2 VIEW COMPARISON:  None Available. FINDINGS: No fracture or dislocation is seen. There are no focal lytic lesions. There is no effusion in the left knee. There is edema in subcutaneous plane. IMPRESSION: No radiographic abnormality is seen in bony structures in left lower leg. There is diffuse edema in subcutaneous plane. Electronically Signed   By: Ernie Avena M.D.   On: 12/13/2022 15:06        Scheduled Meds:  aspirin EC  81 mg Oral Daily   benztropine  1 mg Oral Daily   divalproex  750 mg Oral Q12H   enoxaparin (LOVENOX) injection  40 mg Subcutaneous Q24H   fenofibrate  54 mg Oral Daily   gemfibrozil  600 mg Oral BID   insulin aspart  0-5 Units Subcutaneous QHS   insulin aspart  0-9 Units Subcutaneous TID WC   levothyroxine  88 mcg Oral Q0600   lidocaine (PF)  20 mL Other Once   OLANZapine  20 mg Oral QHS   simvastatin  20 mg Oral QHS   traZODone  150 mg Oral QHS   Continuous Infusions:  sodium chloride 100 mL/hr at 12/14/22 0612     LOS: 1 day     Silvano Bilis, MD [facility]   If 7PM-7AM, please contact night-coverage [email or web address] Password [facility] 12/14/2022, 9:39 AM

## 2022-12-14 NOTE — ED Notes (Signed)
ED TO INPATIENT HANDOFF REPORT  ED Nurse Name and Phone #: Al Corpus, RN [phone]  S Name/Age/Gender Derrick Atkins 52 y.o. male Room/Bed: ED30A/ED30A  Code Status   Code Status: Full Code  Home/SNF/Other Group home Patient oriented to: self, place, time, and situation Is this baseline? Yes   Triage Complete: Triage complete  Chief Complaint Cellulitis of left lower extremity [L03.116]  Triage Note [facility] via [facility].  Called for leg pain.  Patient seen by ?home health on Friday and was told he has cellulitis.  Antibiotics ordered, but have not arrived.  Today both legs are worsening.  Left leg worse than right, increased swelling and weeping. VS wnl.  Hx  Schizophrenia  Social worker will come to hospital.  Pt to ED [facility] with social worker for left leg pain worsening over the past couple days. Redness and swelling noted.  Social worker reports DSS is legal guardian and is aware pt is here.    Allergies Allergies  Allergen Reactions   Phenytoin Sodium Extended Other (See Comments) and Nausea And Vomiting   Prednisone Hives and Nausea And Vomiting   Latex Hives, Nausea And Vomiting and Rash    Level of Care/Admitting Diagnosis ED Disposition     ED Disposition  Admit   Condition  --   Comment  Hospital Area: [facility] [[id number]]  Level of Care: Med-Surg [16]  Covid Evaluation: Asymptomatic - no recent exposure (last 10 days) testing not required  Diagnosis: Cellulitis of left lower extremity [454098]  Admitting Physician: Hannah Beat [[id number]]  Attending Physician: Hannah Beat [[id number]]  Certification:: I certify this patient will need inpatient services for at least 2 midnights  Estimated Length of Stay: 2          B Medical/Surgery History Past Medical History:  Diagnosis Date   Cerebral palsy (HCC)    Depression    Diabetes mellitus without complication (HCC)    Hypertension    Kidney  stones    Schizoaffective disorder (HCC)    Scoliosis    Past Surgical History:  Procedure Laterality Date   BOWEL RESECTION     CATARACT EXTRACTION W/PHACO Right 09/15/2022   Procedure: CATARACT EXTRACTION PHACO AND INTRAOCULAR LENS PLACEMENT (IOC) RIGHT DIABETIC VISION BLUE HEALON 5  15.79  01:31.9;  Surgeon: Lockie Mola, MD;  Location: [facility];  Service: Ophthalmology;  Laterality: Right;  Latex Diabetic   LAPAROTOMY N/A 05/15/2020   Procedure: EXPLORATORY LAPAROTOMY;  Surgeon: Henrene Dodge, MD;  Location: [facility];  Service: General;  Laterality: N/A;     A IV Location/Drains/Wounds Patient Lines/Drains/Airways Status     Active Line/Drains/Airways     Name Placement date Placement time Site Days   Peripheral IV 12/14/22 20 G 1" Right;Posterior Forearm 12/14/22  1102  Forearm  less than 1   Wound / Incision (Open or Dehisced) 12/13/22 Non-pressure wound Leg Anterior;Left;Distal I&D in ED for abcess 12/13/22  1025  Leg  1            Intake/Output Last 24 hours  Intake/Output Summary (Last 24 hours) at 12/14/2022 1150 Last data filed at 12/14/2022 0114 Gross per 24 hour  Intake 1400 ml  Output --  Net 1400 ml    Labs/Imaging Results for orders placed or performed during the hospital encounter of 12/13/22 (from the past 48 hour(s))  Comprehensive metabolic panel     Status: Abnormal   Collection Time: 12/13/22  1:58 PM  Result Value Ref Range   Sodium 140 135 - 145 mmol/L   Potassium 3.6 3.5 - 5.1 mmol/L   Chloride 106 98 - 111 mmol/L   CO2 23 22 - 32 mmol/L   Glucose, Bld 121 (H) 70 - 99 mg/dL    Comment: Glucose reference range applies only to samples taken after fasting for at least 8 hours.   BUN 36 (H) 6 - 20 mg/dL   Creatinine, Ser 1.61 (H) 0.61 - 1.24 mg/dL   Calcium 8.6 (L) 8.9 - 10.3 mg/dL   Total Protein 6.1 (L) 6.5 - 8.1 g/dL   Albumin 3.1 (L) 3.5 - 5.0 g/dL   AST 19 15 - 41 U/L   ALT 11 0 - 44 U/L   Alkaline Phosphatase 66 38 - 126  U/L   Total Bilirubin 1.7 (H) 0.3 - 1.2 mg/dL   GFR, Estimated 54 (L) >60 mL/min    Comment: (NOTE) Calculated using the CKD-EPI Creatinine Equation (2021)    Anion gap 11 5 - 15    Comment: Performed at [facility], [address] [place], [place] [address]  Brain natriuretic peptide     Status: Abnormal   Collection Time: 12/13/22  1:58 PM  Result Value Ref Range   B Natriuretic Peptide 212.2 (H) 0.0 - 100.0 pg/mL    Comment: Performed at [facility], [address] [place], [place] [address]  Lactic acid, plasma     Status: None   Collection Time: 12/13/22  1:58 PM  Result Value Ref Range   Lactic Acid, Venous 1.5 0.5 - 1.9 mmol/L    Comment: Performed at [facility], [address] [place], [place] [address]  CBC with Differential     Status: Abnormal   Collection Time: 12/13/22  1:58 PM  Result Value Ref Range   WBC 10.5 4.0 - 10.5 K/uL   RBC 3.68 (L) 4.22 - 5.81 MIL/uL   Hemoglobin 10.5 (L) 13.0 - 17.0 g/dL   HCT 78.2 (L) 95.6 - 21.3 %   MCV 88.9 80.0 - 100.0 fL   MCH 28.5 26.0 - 34.0 pg   MCHC 32.1 30.0 - 36.0 g/dL   RDW 08.6 57.8 - 46.9 %   Platelets 123 (L) 150 - 400 K/uL   nRBC 0.0 0.0 - 0.2 %   Neutrophils Relative % 81 %   Neutro Abs 8.5 (H) 1.7 - 7.7 K/uL   Lymphocytes Relative 7 %   Lymphs Abs 0.7 0.7 - 4.0 K/uL   Monocytes Relative 10 %   Monocytes Absolute 1.0 0.1 - 1.0 K/uL   Eosinophils Relative 0 %   Eosinophils Absolute 0.0 0.0 - 0.5 K/uL   Basophils Relative 0 %   Basophils Absolute 0.0 0.0 - 0.1 K/uL   Immature Granulocytes 2 %   Abs Immature Granulocytes 0.17 (H) 0.00 - 0.07 K/uL    Comment: Performed at [facility], [address] [place], [place] [address]  Protime-INR     Status: Abnormal   Collection Time: 12/13/22  1:58 PM  Result Value Ref Range   Prothrombin Time 15.9 (H) 11.4 - 15.2 seconds   INR 1.2 0.8 - 1.2    Comment: (NOTE) INR goal varies based on device and disease  states. Performed at [facility], [address] [place], [place] [address]   Lactic acid, plasma     Status: None   Collection Time: 12/13/22 10:32 PM  Result Value Ref Range  Lactic Acid, Venous 1.2 0.5 - 1.9 mmol/L    Comment: Performed at [facility], [address] [place], [place] [address]  CBG monitoring, ED     Status: None   Collection Time: 12/13/22 10:59 PM  Result Value Ref Range   Glucose-Capillary 86 70 - 99 mg/dL    Comment: Glucose reference range applies only to samples taken after fasting for at least 8 hours.  Urinalysis, Routine w reflex microscopic -Urine, Clean Catch     Status: Abnormal   Collection Time: 12/14/22  4:37 AM  Result Value Ref Range   Color, Urine AMBER (A) YELLOW    Comment: BIOCHEMICALS MAY BE AFFECTED BY COLOR   APPearance CLEAR (A) CLEAR   Specific Gravity, Urine 1.023 1.005 - 1.030   pH 5.0 5.0 - 8.0   Glucose, UA NEGATIVE NEGATIVE mg/dL   Hgb urine dipstick NEGATIVE NEGATIVE   Bilirubin Urine NEGATIVE NEGATIVE   Ketones, ur NEGATIVE NEGATIVE mg/dL   Protein, ur NEGATIVE NEGATIVE mg/dL   Nitrite NEGATIVE NEGATIVE   Leukocytes,Ua NEGATIVE NEGATIVE    Comment: Performed at [facility], [address] [place], [place] [address]  Basic metabolic panel     Status: Abnormal   Collection Time: 12/14/22  4:38 AM  Result Value Ref Range   Sodium 139 135 - 145 mmol/L   Potassium 3.4 (L) 3.5 - 5.1 mmol/L   Chloride 107 98 - 111 mmol/L   CO2 21 (L) 22 - 32 mmol/L   Glucose, Bld 138 (H) 70 - 99 mg/dL    Comment: Glucose reference range applies only to samples taken after fasting for at least 8 hours.   BUN 32 (H) 6 - 20 mg/dL   Creatinine, Ser 6.21 (H) 0.61 - 1.24 mg/dL   Calcium 7.9 (L) 8.9 - 10.3 mg/dL   GFR, Estimated >30 >86 mL/min    Comment: (NOTE) Calculated using the CKD-EPI Creatinine Equation (2021)    Anion gap 11 5 - 15    Comment: Performed at [facility], [address]  [place], [place] [address]  CBC     Status: Abnormal   Collection Time: 12/14/22  4:38 AM  Result Value Ref Range   WBC 6.2 4.0 - 10.5 K/uL   RBC 2.72 (L) 4.22 - 5.81 MIL/uL   Hemoglobin 7.7 (L) 13.0 - 17.0 g/dL    Comment: REPEATED TO VERIFY   HCT 23.8 (L) 39.0 - 52.0 %   MCV 87.5 80.0 - 100.0 fL   MCH 28.3 26.0 - 34.0 pg   MCHC 32.4 30.0 - 36.0 g/dL   RDW 96.2 95.2 - 84.1 %   Platelets 95 (L) 150 - 400 K/uL    Comment: Immature Platelet Fraction may be clinically indicated, consider ordering this additional test [id number]    nRBC 0.0 0.0 - 0.2 %    Comment: Performed at [facility], [address] [place], [place] [address]  CBG monitoring, ED     Status: Abnormal   Collection Time: 12/14/22  7:35 AM  Result Value Ref Range   Glucose-Capillary 103 (H) 70 - 99 mg/dL    Comment: Glucose reference range applies only to samples taken after fasting for at least 8 hours.  CBC     Status: Abnormal   Collection Time: 12/14/22 11:04 AM  Result Value Ref Range   WBC 7.1 4.0 - 10.5 K/uL   RBC 3.13 (L) 4.22 - 5.81 MIL/uL   Hemoglobin 8.8 (L) 13.0 -  17.0 g/dL   HCT 09.8 (L) 11.9 - 14.7 %   MCV 87.5 80.0 - 100.0 fL   MCH 28.1 26.0 - 34.0 pg   MCHC 32.1 30.0 - 36.0 g/dL   RDW 82.9 56.2 - 13.0 %   Platelets 97 (L) 150 - 400 K/uL    Comment: Immature Platelet Fraction may be clinically indicated, consider ordering this additional test [id number]    nRBC 0.0 0.0 - 0.2 %    Comment: Performed at [facility], [address] [place], [place] [address]  CBG monitoring, ED     Status: Abnormal   Collection Time: 12/14/22 11:27 AM  Result Value Ref Range   Glucose-Capillary 143 (H) 70 - 99 mg/dL    Comment: Glucose reference range applies only to samples taken after fasting for at least 8 hours.   Comment 1 Notify RN    Comment 2 Document in Chart    US Venous Img Lower Unilateral Left  Result Date: 12/13/2022 CLINICAL DATA:  Left lower extremity pain, redness, and  swelling EXAM: LEFT LOWER EXTREMITY VENOUS DOPPLER ULTRASOUND TECHNIQUE: Gray-scale sonography with compression, as well as color and duplex ultrasound, were performed to evaluate the deep venous system(s) from the level of the common femoral vein through the popliteal and proximal calf veins. COMPARISON:  None Available. FINDINGS: VENOUS Normal compressibility of the common femoral, superficial femoral, and popliteal veins, as well as the visualized calf veins. Visualized portions of profunda femoral vein and great saphenous vein unremarkable. No filling defects to suggest DVT on grayscale or color Doppler imaging. Doppler waveforms show normal direction of venous flow, normal respiratory plasticity and response to augmentation. Limited views of the contralateral common femoral vein are unremarkable. OTHER Subcutaneous soft tissue swelling overlying the popliteal region and calf. Limitations: none IMPRESSION: Negative. Electronically Signed   By: Agustin Cree M.D.   On: 12/13/2022 16:38   DG Tibia/Fibula Left  Result Date: 12/13/2022 CLINICAL DATA:  Pain EXAM: LEFT TIBIA AND FIBULA - 2 VIEW COMPARISON:  None Available. FINDINGS: No fracture or dislocation is seen. There are no focal lytic lesions. There is no effusion in the left knee. There is edema in subcutaneous plane. IMPRESSION: No radiographic abnormality is seen in bony structures in left lower leg. There is diffuse edema in subcutaneous plane. Electronically Signed   By: Ernie Avena M.D.   On: 12/13/2022 15:06    Pending Labs Unresulted Labs (From admission, onward)     Start     Ordered   12/20/22 0500  Creatinine, serum  (enoxaparin (LOVENOX)    CrCl >/= 30 ml/min)  Weekly,   TIMED     Comments: while on enoxaparin therapy    12/13/22 1944   12/15/22 0500  Basic metabolic panel  Daily,   R      12/14/22 0958   12/14/22 1041  Aerobic Culture w Gram Stain (superficial specimen)  Once,   R        12/14/22 1040   12/14/22 0958  CBC   Daily,   R      12/14/22 0957   12/14/22 0950  Culture, blood (Routine X 2) w Reflex to ID Panel  BLOOD CULTURE X 2,   TIMED      12/14/22 0957   12/13/22 2230  HIV Antibody (routine testing w rflx)  Once,   R        12/13/22 2230   12/13/22 2230  Hemoglobin A1c  Once,   R  12/13/22 2230            Vitals/Pain Today's Vitals   12/14/22 1045 12/14/22 1100 12/14/22 1110 12/14/22 1111  BP:    (!) 115/51  Pulse: 78 80  75  Resp: (!) 22 16  16   Temp:      TempSrc:      SpO2: 100% 100%  100%  Weight:      Height:      PainSc:   3      Isolation Precautions No active isolations  Medications Medications  lidocaine (PF) (XYLOCAINE) 1 % injection 20 mL (has no administration in time range)  enoxaparin (LOVENOX) injection 40 mg (40 mg Subcutaneous Given 12/13/22 2250)  0.9 %  sodium chloride infusion ( Intravenous New Bag/Given 12/14/22 1109)  acetaminophen (TYLENOL) tablet 650 mg (650 mg Oral Given 12/14/22 1002)    Or  acetaminophen (TYLENOL) suppository 650 mg ( Rectal See Alternative 12/14/22 1002)  traZODone (DESYREL) tablet 25 mg (has no administration in time range)  magnesium hydroxide (MILK OF MAGNESIA) suspension 30 mL (has no administration in time range)  metoCLOPramide (REGLAN) injection 5 mg (has no administration in time range)  aspirin EC tablet 81 mg (81 mg Oral Given 12/14/22 1002)  fenofibrate tablet 54 mg (54 mg Oral Given 12/14/22 1001)  gemfibrozil (LOPID) tablet 600 mg (600 mg Oral Given 12/14/22 1002)  simvastatin (ZOCOR) tablet 20 mg (20 mg Oral Given 12/14/22 0200)  hydrOXYzine (ATARAX) tablet 50 mg (has no administration in time range)  levothyroxine (SYNTHROID) tablet 88 mcg (88 mcg Oral Given 12/14/22 0642)  divalproex (DEPAKOTE) DR tablet 750 mg (750 mg Oral Given 12/14/22 1001)  insulin aspart (novoLOG) injection 0-9 Units ( Subcutaneous Not Given 12/14/22 0738)  insulin aspart (novoLOG) injection 0-5 Units (0 Units Subcutaneous Given 12/13/22 2259)  morphine  (PF) 2 MG/ML injection 2 mg (2 mg Intravenous Not Given 12/14/22 1000)  oxyCODONE (Oxy IR/ROXICODONE) immediate release tablet 5 mg (5 mg Oral Given 12/14/22 1009)  vancomycin (VANCOREADY) IVPB 1250 mg/250 mL (1,250 mg Intravenous New Bag/Given 12/14/22 1110)  busPIRone (BUSPAR) tablet 15 mg (15 mg Oral Given 12/14/22 1010)  benztropine (COGENTIN) tablet 1 mg (has no administration in time range)  OLANZapine (ZYPREXA) tablet 30 mg (has no administration in time range)  sodium chloride 0.9 % bolus 1,000 mL (0 mLs Intravenous Stopped 12/14/22 0114)  vancomycin (VANCOREADY) IVPB 2000 mg/400 mL (0 mg Intravenous Stopped 12/14/22 0051)  morphine (PF) 2 MG/ML injection 2 mg (2 mg Intravenous Given 12/14/22 1000)    Mobility walks     Focused Assessments Cardiac Assessment Handoff:  Cardiac Rhythm: Normal sinus rhythm (HR 68) No results found for: "CKTOTAL", "CKMB", "CKMBINDEX", "TROPONINI" No results found for: "DDIMER" Does the Patient currently have chest pain? No    R Recommendations: See Admitting Provider Note  Report given to:   Additional Notes:  A&O, calm, NAD, interactive, pleasant

## 2022-12-14 NOTE — Plan of Care (Signed)

## 2022-12-15 DIAGNOSIS — L02416 Cutaneous abscess of left lower limb: Secondary | ICD-10-CM | POA: Diagnosis not present

## 2022-12-15 DIAGNOSIS — L03116 Cellulitis of left lower limb: Secondary | ICD-10-CM | POA: Diagnosis not present

## 2022-12-15 LAB — BASIC METABOLIC PANEL
Anion gap: 6 (ref 5–15)
BUN: 28 mg/dL — ABNORMAL HIGH (ref 6–20)
CO2: 25 mmol/L (ref 22–32)
Calcium: 7.8 mg/dL — ABNORMAL LOW (ref 8.9–10.3)
Chloride: 106 mmol/L (ref 98–111)
Creatinine, Ser: 1.28 mg/dL — ABNORMAL HIGH (ref 0.61–1.24)
GFR, Estimated: >60 mL/min (ref >60)
Glucose, Bld: 92 mg/dL (ref 70–99)
Potassium: 3.6 mmol/L (ref 3.5–5.1)
Sodium: 137 mmol/L (ref 135–145)

## 2022-12-15 LAB — GLUCOSE, CAPILLARY
Glucose-Capillary: 84 mg/dL (ref 70–99)
Glucose-Capillary: 133 mg/dL — ABNORMAL HIGH (ref 70–99)
Glucose-Capillary: 104 mg/dL — ABNORMAL HIGH (ref 70–99)
Glucose-Capillary: 129 mg/dL — ABNORMAL HIGH (ref 70–99)

## 2022-12-15 LAB — CBC
HCT: 23.8 % — ABNORMAL LOW (ref 39.0–52.0)
Hemoglobin: 7.7 g/dL — ABNORMAL LOW (ref 13.0–17.0)
MCH: 28.1 pg (ref 26.0–34.0)
MCHC: 32.4 g/dL (ref 30.0–36.0)
MCV: 86.9 fL (ref 80.0–100.0)
Platelets: 97 10*3/uL — ABNORMAL LOW (ref 150–400)
RBC: 2.74 MIL/uL — ABNORMAL LOW (ref 4.22–5.81)
RDW: 14.4 % (ref 11.5–15.5)
WBC: 6 10*3/uL (ref 4.0–10.5)
nRBC: 0 % (ref 0.0–0.2)

## 2022-12-15 LAB — VITAMIN D 25 HYDROXY (VIT D DEFICIENCY, FRACTURES): Vit D, 25-Hydroxy: 19.45 ng/mL — ABNORMAL LOW (ref 30–100)

## 2022-12-15 LAB — HIV ANTIBODY (ROUTINE TESTING W REFLEX): HIV Screen 4th Generation wRfx: NONREACTIVE

## 2022-12-15 LAB — VITAMIN B12: Vitamin B-12: 1208 pg/mL — ABNORMAL HIGH (ref 180–914)

## 2022-12-15 LAB — IRON AND TIBC
Iron: 42 ug/dL — ABNORMAL LOW (ref 45–182)
Saturation Ratios: 21 % (ref 17.9–39.5)
TIBC: 196 ug/dL — ABNORMAL LOW (ref 250–450)
UIBC: 154 ug/dL

## 2022-12-15 LAB — HEMOGLOBIN A1C
Hgb A1c MFr Bld: 4.7 % — ABNORMAL LOW (ref 4.8–5.6)
Mean Plasma Glucose: 88 mg/dL

## 2022-12-15 LAB — AEROBIC CULTURE W GRAM STAIN (SUPERFICIAL SPECIMEN)

## 2022-12-15 LAB — FOLATE: Folate: 7.7 ng/mL (ref >5.9)

## 2022-12-15 LAB — CULTURE, BLOOD (ROUTINE X 2)

## 2022-12-15 MED ORDER — PIPERACILLIN-TAZOBACTAM 3.375 G IVPB
3.3750 g | Freq: Three times a day (TID) | INTRAVENOUS | Status: DC
  Administered 2022-12-15 – 2022-12-16 (×3): 3.375 g via INTRAVENOUS
  Filled 2022-12-15 (×3): qty 50

## 2022-12-15 MED ORDER — PIPERACILLIN-TAZOBACTAM 3.375 G IVPB 30 MIN: 3.3750 g | Freq: Four times a day (QID) | INTRAVENOUS | Status: DC

## 2022-12-15 NOTE — Plan of Care (Signed)

## 2022-12-15 NOTE — Progress Notes (Signed)
[facility] Progress Note  Patient: Derrick Atkins    ZOX:[id number]  DOA: 12/13/2022     Date of Service: the patient was seen and examined on 12/15/2022  Chief Complaint  Patient presents with   Leg Pain   Brief hospital course: [name] is a 53 y.o. Caucasian male with medical history significant for type II diabetes mellitus, hypertension, schizoaffective disorder, cerebral palsy and depression, who presented to the emergency room with acute onset of worsening left lower extremity swelling with erythema and, tenderness and pain.  He stated that it has been going on over the last 6 months but got significantly worse over the last 6 days.  Did not notice any drainage though.  No fever but he experienced chills.  No chest pain or dyspnea or cough.  No nausea or vomiting or abdominal pain.  No dysuria, oliguria or hematuria or flank pain.  He did not experience significant elevation of his blood glucose levels.    Assessment and Plan: Principal Problem:   Cellulitis and abscess of left leg Active Problems:   Type 2 diabetes mellitus without complications (HCC)   AKI (acute kidney injury) (HCC)   Essential hypertension   Schizophrenia, paranoid, chronic (HCC)   Hypothyroidism   Dyslipidemia   # Abscess with cellulitis LLE. No DVT on PVL. S/p I and D in the ED with report of marked purulence noted. On 6/4 Dr Ashok Pall was able to express a large amount of purulent bloody fluid from the 1 cm stab incision just above the left ankle.  Culture ordered by ED provider but appears was never collected and/or sent. Patient had a one-time dose of vancomycin but further antibiotics have not been ordered -Continue vancomycin and started Zosyn IV every 6 hourly -Abscess culture was collected and sent  - blood cultures were ordered but after start of IV antibiotics - would involve general surgery if does not improve over the next day or so   # AKI Likely prerenal, responding to fluids, 1.56  on presentation, 1.37--1.28 today - continue gentle fluids   # Guardianship Candice Abran Cantor is listed as patient's legal guardian, with DSS, patient is a ward of the state. I updated her today. Patient resides in a group home. Swentrail Allen-Bird is the Child psychotherapist (also listed in the demographics tab) who works with the patient most closely.    # T2DM Normoglycemic here - SSI   # Hypothyroid - home levothyroxine   # Schizophrenia Without behavioral disturbance - cont home zyprexa and buspar and cogentin, is also on monthly abilify IM at home   # HTN Here bp wnl - hold home antihypertensives  # Anemia of chronic disease, anemia workup within normal range Continue monitor H&H  # Vitamin D deficiency: started vitamin D 50,000 units p.o. weekly, follow with PCP to repeat vitamin D level after 3 to 6 months.  Body mass index is 25.81 kg/m.  Interventions:       Diet: Diabetic diet DVT Prophylaxis: Subcutaneous Lovenox   Advance goals of care discussion: Full code  Family Communication: family was not present at bedside, at the time of interview.  The pt provided permission to discuss medical plan with the family. Opportunity was given to ask question and all questions were answered satisfactorily.   Disposition:  Pt is from group home, admitted with left lower extremity wound infection, still on IV antibiotics, which precludes a safe discharge. Discharge to group home, when clinically stable, may need few days more  IV antibiotics.  Subjective: No significant events overnight, patient does not have pain, left lower extremity is still tender and swollen.  Denies any other active issues.  Physical Exam: General: NAD, lying comfortably Appear in no distress, affect appropriate Eyes: PERRLA ENT: Oral Mucosa Clear, moist  Neck: no JVD,  Cardiovascular: S1 and S2 Present, no Murmur,  Respiratory: good respiratory effort, Bilateral Air entry equal and Decreased, no  Crackles, no wheezes Abdomen: Bowel Sound present, Soft and no tenderness,  Skin: no rashes Extremities: no Pedal edema, no calf tenderness Neurologic: without any new focal findings Gait not checked due to patient safety concerns  Vitals:   12/14/22 1615 12/14/22 2014 12/15/22 0502 12/15/22 0752  BP: 117/65 111/67 (!) 96/48 (!) 110/58  Pulse: 82 79 73 83  Resp: 16 18 18 18   Temp: 97.8 F (36.6 C) 98.1 F (36.7 C)  98.9 F (37.2 C)  TempSrc: Oral Oral    SpO2: 100% 99% 97% 97%  Weight:      Height:        Intake/Output Summary (Last 24 hours) at 12/15/2022 1531 Last data filed at 12/15/2022 1431 Gross per 24 hour  Intake 2434.19 ml  Output 850 ml  Net 1584.19 ml   Filed Weights   12/13/22 1356  Weight: 96.2 kg    Data Reviewed: I have personally reviewed and interpreted daily labs, tele strips, imagings as discussed above. I reviewed all nursing notes, pharmacy notes, vitals, pertinent old records I have discussed plan of care as described above with RN and patient/family.  CBC: Recent Labs  Lab 12/13/22 1358 12/14/22 0438 12/14/22 1104 12/15/22 0440  WBC 10.5 6.2 7.1 6.0  NEUTROABS 8.5*  --   --   --   HGB 10.5* 7.7* 8.8* 7.7*  HCT 32.7* 23.8* 27.4* 23.8*  MCV 88.9 87.5 87.5 86.9  PLT 123* 95* 97* 97*   Basic Metabolic Panel: Recent Labs  Lab 12/13/22 1358 12/14/22 0438 12/15/22 0440  NA 140 139 137  K 3.6 3.4* 3.6  CL 106 107 106  CO2 23 21* 25  GLUCOSE 121* 138* 92  BUN 36* 32* 28*  CREATININE 1.56* 1.37* 1.28*  CALCIUM 8.6* 7.9* 7.8*    Studies: No results found.  Scheduled Meds:  aspirin EC  81 mg Oral Daily   benztropine  1 mg Oral BID   busPIRone  15 mg Oral TID   divalproex  750 mg Oral Q12H   enoxaparin (LOVENOX) injection  40 mg Subcutaneous Q24H   fenofibrate  54 mg Oral Daily   gemfibrozil  600 mg Oral BID   insulin aspart  0-5 Units Subcutaneous QHS   insulin aspart  0-9 Units Subcutaneous TID WC   levothyroxine  88 mcg Oral  Q0600    morphine injection  2 mg Intravenous Once   OLANZapine  30 mg Oral QHS   simvastatin  20 mg Oral QHS   Continuous Infusions:  sodium chloride 100 mL/hr at 12/14/22 2159   vancomycin 1,250 mg (12/15/22 1215)   PRN Meds: acetaminophen **OR** acetaminophen, hydrOXYzine, magnesium hydroxide, metoCLOPramide (REGLAN) injection, oxyCODONE, traZODone  Time spent: 35 minutes  Author: Gillis Santa. MD [facility] 12/15/2022 3:31 PM  To reach On-call, see care teams to locate the attending and reach out to them via [email or web address]. If 7PM-7AM, please contact night-coverage If you still have difficulty reaching the attending provider, please page the [facility] (Director on Call) for [facility] on amion for assistance.

## 2022-12-16 ENCOUNTER — Inpatient Hospital Stay: Payer: 59

## 2022-12-16 DIAGNOSIS — L02416 Cutaneous abscess of left lower limb: Secondary | ICD-10-CM | POA: Diagnosis not present

## 2022-12-16 DIAGNOSIS — L03116 Cellulitis of left lower limb: Secondary | ICD-10-CM | POA: Diagnosis not present

## 2022-12-16 LAB — CBC
HCT: 24.2 % — ABNORMAL LOW (ref 39.0–52.0)
Hemoglobin: 7.8 g/dL — ABNORMAL LOW (ref 13.0–17.0)
MCH: 27.9 pg (ref 26.0–34.0)
MCHC: 32.2 g/dL (ref 30.0–36.0)
MCV: 86.4 fL (ref 80.0–100.0)
Platelets: 97 10*3/uL — ABNORMAL LOW (ref 150–400)
RBC: 2.8 MIL/uL — ABNORMAL LOW (ref 4.22–5.81)
RDW: 14.3 % (ref 11.5–15.5)
WBC: 5.8 10*3/uL (ref 4.0–10.5)
nRBC: 0 % (ref 0.0–0.2)

## 2022-12-16 LAB — BASIC METABOLIC PANEL
Anion gap: 6 (ref 5–15)
BUN: 22 mg/dL — ABNORMAL HIGH (ref 6–20)
CO2: 25 mmol/L (ref 22–32)
Calcium: 7.9 mg/dL — ABNORMAL LOW (ref 8.9–10.3)
Chloride: 108 mmol/L (ref 98–111)
Creatinine, Ser: 1.07 mg/dL (ref 0.61–1.24)
GFR, Estimated: >60 mL/min (ref >60)
Glucose, Bld: 126 mg/dL — ABNORMAL HIGH (ref 70–99)
Potassium: 4 mmol/L (ref 3.5–5.1)
Sodium: 139 mmol/L (ref 135–145)

## 2022-12-16 LAB — AEROBIC CULTURE W GRAM STAIN (SUPERFICIAL SPECIMEN)

## 2022-12-16 LAB — PHOSPHORUS: Phosphorus: 3.8 mg/dL (ref 2.5–4.6)

## 2022-12-16 LAB — GLUCOSE, CAPILLARY
Glucose-Capillary: 88 mg/dL (ref 70–99)
Glucose-Capillary: 112 mg/dL — ABNORMAL HIGH (ref 70–99)
Glucose-Capillary: 94 mg/dL (ref 70–99)
Glucose-Capillary: 130 mg/dL — ABNORMAL HIGH (ref 70–99)

## 2022-12-16 LAB — CULTURE, BLOOD (ROUTINE X 2)
Culture: NO GROWTH
Special Requests: ADEQUATE

## 2022-12-16 LAB — MAGNESIUM: Magnesium: 2.3 mg/dL (ref 1.7–2.4)

## 2022-12-16 MED ORDER — VITAMIN C 500 MG PO TABS
500.0000 mg | ORAL_TABLET | Freq: Every day | ORAL | Status: DC
  Administered 2022-12-16 – 2022-12-23 (×8): 500 mg via ORAL
  Filled 2022-12-16 (×8): qty 1

## 2022-12-16 MED ORDER — CEFAZOLIN SODIUM-DEXTROSE 2-4 GM/100ML-% IV SOLN
2.0000 g | Freq: Three times a day (TID) | INTRAVENOUS | Status: AC
  Administered 2022-12-16 – 2022-12-22 (×20): 2 g via INTRAVENOUS
  Filled 2022-12-16 (×20): qty 100

## 2022-12-16 MED ORDER — FOLIC ACID 1 MG PO TABS
1.0000 mg | ORAL_TABLET | Freq: Every day | ORAL | Status: DC
  Administered 2022-12-16 – 2022-12-23 (×8): 1 mg via ORAL
  Filled 2022-12-16 (×8): qty 1

## 2022-12-16 MED ORDER — POLYSACCHARIDE IRON COMPLEX 150 MG PO CAPS
150.0000 mg | ORAL_CAPSULE | Freq: Every day | ORAL | Status: DC
  Administered 2022-12-16 – 2022-12-23 (×8): 150 mg via ORAL
  Filled 2022-12-16 (×8): qty 1

## 2022-12-16 MED ORDER — VITAMIN D (ERGOCALCIFEROL) 1.25 MG (50000 UNIT) PO CAPS
50000.0000 [IU] | ORAL_CAPSULE | ORAL | Status: DC
  Administered 2022-12-16 – 2022-12-23 (×2): 50000 [IU] via ORAL
  Filled 2022-12-16 (×2): qty 1

## 2022-12-16 MED ORDER — IOHEXOL 300 MG/ML  SOLN
100.0000 mL | Freq: Once | INTRAMUSCULAR | Status: AC | PRN
  Administered 2022-12-16: 100 mL via INTRAVENOUS

## 2022-12-16 NOTE — Progress Notes (Signed)
[facility] Progress Note  Patient: Derrick Atkins    JJO:[id number]  DOA: 12/13/2022     Date of Service: the patient was seen and examined on 12/16/2022  Chief Complaint  Patient presents with   Leg Pain   Brief hospital course: Derrick Atkins is a 52 y.o. Caucasian male with medical history significant for type II diabetes mellitus, hypertension, schizoaffective disorder, cerebral palsy and depression, who presented to the emergency room with acute onset of worsening left lower extremity swelling with erythema and, tenderness and pain.  He stated that it has been going on over the last 6 months but got significantly worse over the last 6 days.  Did not notice any drainage though.  No fever but he experienced chills.  No chest pain or dyspnea or cough.  No nausea or vomiting or abdominal pain.  No dysuria, oliguria or hematuria or flank pain.  He did not experience significant elevation of his blood glucose levels.    Assessment and Plan: Principal Problem:   Cellulitis and abscess of left leg Active Problems:   Type 2 diabetes mellitus without complications (HCC)   AKI (acute kidney injury) (HCC)   Essential hypertension   Schizophrenia, paranoid, chronic (HCC)   Hypothyroidism   Dyslipidemia   # Abscess with cellulitis LLE. No DVT on PVL. S/p I and D in the ED with report of marked purulence noted. On 6/4 Dr Derrick Atkins was able to express a large amount of purulent bloody fluid from the 1 cm stab incision just above the left ankle.  Culture ordered by ED provider but appears was never collected and/or sent. Patient had a one-time dose of vancomycin but further antibiotics have not been ordered -s/p vancomycin and started Zosyn,  wound Cx growing MSSA, so started Ancef 2g q8h  blood cultures were ordered but after start of IV antibiotics - would involve general surgery if does not improve over the next day or so 6/6 f/u CT LLE to r/o Abscess    # AKI Likely prerenal, responding  to fluids, 1.56 on presentation, 1.37--1.07 today - continue gentle fluids   # Guardianship Derrick Atkins is listed as patient's legal guardian, with DSS, patient is a ward of the state. I updated her today. Patient resides in a group home. Derrick Atkins is the Child psychotherapist (also listed in the demographics tab) who works with the patient most closely.    # T2DM Normoglycemic here - SSI   # Hypothyroid - home levothyroxine   # Schizophrenia Without behavioral disturbance - cont home zyprexa and buspar and cogentin, is also on monthly abilify IM at home   # HTN Here bp wnl - hold home antihypertensives  # Anemia of chronic disease Iron 42, slight, started oral iron continue with vitamin C. Folic acid 7.7 at lower end, started oral Folic acid  B12 over the limit  # Vitamin D deficiency: started vitamin D 50,000 units p.o. weekly, follow with PCP to repeat vitamin D level after 3 to 6 months.  Body mass index is 25.81 kg/m.  Interventions:       Diet: Diabetic diet DVT Prophylaxis: Subcutaneous Lovenox   Advance goals of care discussion: Full code  Family Communication: family was not present at bedside, at the time of interview.  The pt provided permission to discuss medical plan with the family. Opportunity was given to ask question and all questions were answered satisfactorily.   Disposition:  Pt is from group home, admitted with left lower  extremity wound infection, still on IV antibiotics, which precludes a safe discharge. Discharge to group home, when clinically stable, may need few days more IV antibiotics.  Subjective: No significant events overnight, patient does not have pain, left lower extremity is still tender and swollen.  Denies any other active issues.  Physical Exam: General: NAD, lying comfortably Appear in no distress, affect appropriate Eyes: PERRLA ENT: Oral Mucosa Clear, moist  Neck: no JVD,  Cardiovascular: S1 and S2 Present, no  Murmur,  Respiratory: good respiratory effort, Bilateral Air entry equal and Decreased, no Crackles, no wheezes Abdomen: Bowel Sound present, Soft and no tenderness,  Skin: no rashes Extremities: RLEno Pedal edema, no calf tenderness. LLE swelling, erythematous and tender Neurologic: without any new focal findings Gait not checked due to patient safety concerns  Vitals:   12/15/22 1720 12/15/22 2053 12/16/22 0400 12/16/22 0845  BP: (!) 127/107 (!) 126/54 (!) 112/58 (!) 108/48  Pulse: 78 70 71 81  Resp: 18 20 17 18   Temp: 98 F (36.7 C) 98.2 F (36.8 C) 97.8 F (36.6 C) 98 F (36.7 C)  TempSrc:  Oral Oral   SpO2: 100% 97% 99% 99%  Weight:      Height:        Intake/Output Summary (Last 24 hours) at 12/16/2022 1508 Last data filed at 12/16/2022 0500 Gross per 24 hour  Intake 719.51 ml  Output 600 ml  Net 119.51 ml   Filed Weights   12/13/22 1356  Weight: 96.2 kg    Data Reviewed: I have personally reviewed and interpreted daily labs, tele strips, imagings as discussed above. I reviewed all nursing notes, pharmacy notes, vitals, pertinent old records I have discussed plan of care as described above with RN and patient/family.  CBC: Recent Labs  Lab 12/13/22 1358 12/14/22 0438 12/14/22 1104 12/15/22 0440 12/16/22 0527  WBC 10.5 6.2 7.1 6.0 5.8  NEUTROABS 8.5*  --   --   --   --   HGB 10.5* 7.7* 8.8* 7.7* 7.8*  HCT 32.7* 23.8* 27.4* 23.8* 24.2*  MCV 88.9 87.5 87.5 86.9 86.4  PLT 123* 95* 97* 97* 97*   Basic Metabolic Panel: Recent Labs  Lab 12/13/22 1358 12/14/22 0438 12/15/22 0440 12/16/22 0527  NA 140 139 137 139  K 3.6 3.4* 3.6 4.0  CL 106 107 106 108  CO2 23 21* 25 25  GLUCOSE 121* 138* 92 126*  BUN 36* 32* 28* 22*  CREATININE 1.56* 1.37* 1.28* 1.07  CALCIUM 8.6* 7.9* 7.8* 7.9*  MG  --   --   --  2.3  PHOS  --   --   --  3.8    Studies: No results found.  Scheduled Meds:  aspirin EC  81 mg Oral Daily   benztropine  1 mg Oral BID   busPIRone   15 mg Oral TID   divalproex  750 mg Oral Q12H   enoxaparin (LOVENOX) injection  40 mg Subcutaneous Q24H   fenofibrate  54 mg Oral Daily   gemfibrozil  600 mg Oral BID   insulin aspart  0-5 Units Subcutaneous QHS   insulin aspart  0-9 Units Subcutaneous TID WC   levothyroxine  88 mcg Oral Q0600    morphine injection  2 mg Intravenous Once   OLANZapine  30 mg Oral QHS   simvastatin  20 mg Oral QHS   Continuous Infusions:  sodium chloride 100 mL/hr at 12/14/22 2159    ceFAZolin (ANCEF) IV  PRN Meds: acetaminophen **OR** acetaminophen, hydrOXYzine, magnesium hydroxide, metoCLOPramide (REGLAN) injection, oxyCODONE, traZODone  Time spent: 35 minutes  Author: Gillis Santa. MD [facility] 12/16/2022 3:08 PM  To reach On-call, see care teams to locate the attending and reach out to them via [email or web address]. If 7PM-7AM, please contact night-coverage If you still have difficulty reaching the attending provider, please page the [facility] (Director on Call) for [facility] on amion for assistance.

## 2022-12-17 DIAGNOSIS — L02416 Cutaneous abscess of left lower limb: Secondary | ICD-10-CM | POA: Diagnosis not present

## 2022-12-17 DIAGNOSIS — L03116 Cellulitis of left lower limb: Secondary | ICD-10-CM | POA: Diagnosis not present

## 2022-12-17 LAB — BASIC METABOLIC PANEL
Anion gap: 6 (ref 5–15)
BUN: 20 mg/dL (ref 6–20)
CO2: 26 mmol/L (ref 22–32)
Calcium: 8.1 mg/dL — ABNORMAL LOW (ref 8.9–10.3)
Chloride: 107 mmol/L (ref 98–111)
Creatinine, Ser: 1.1 mg/dL (ref 0.61–1.24)
GFR, Estimated: >60 mL/min (ref >60)
Glucose, Bld: 74 mg/dL (ref 70–99)
Potassium: 4.2 mmol/L (ref 3.5–5.1)
Sodium: 139 mmol/L (ref 135–145)

## 2022-12-17 LAB — CBC
HCT: 25.5 % — ABNORMAL LOW (ref 39.0–52.0)
Hemoglobin: 8.3 g/dL — ABNORMAL LOW (ref 13.0–17.0)
MCH: 28.2 pg (ref 26.0–34.0)
MCHC: 32.5 g/dL (ref 30.0–36.0)
MCV: 86.7 fL (ref 80.0–100.0)
Platelets: 112 10*3/uL — ABNORMAL LOW (ref 150–400)
RBC: 2.94 MIL/uL — ABNORMAL LOW (ref 4.22–5.81)
RDW: 14.5 % (ref 11.5–15.5)
WBC: 6.8 10*3/uL (ref 4.0–10.5)
nRBC: 0 % (ref 0.0–0.2)

## 2022-12-17 LAB — GLUCOSE, CAPILLARY
Glucose-Capillary: 61 mg/dL — ABNORMAL LOW (ref 70–99)
Glucose-Capillary: 73 mg/dL (ref 70–99)
Glucose-Capillary: 145 mg/dL — ABNORMAL HIGH (ref 70–99)
Glucose-Capillary: 125 mg/dL — ABNORMAL HIGH (ref 70–99)
Glucose-Capillary: 147 mg/dL — ABNORMAL HIGH (ref 70–99)

## 2022-12-17 LAB — CULTURE, BLOOD (ROUTINE X 2)

## 2022-12-17 MED ORDER — BISACODYL 5 MG PO TBEC: 10.0000 mg | DELAYED_RELEASE_TABLET | Freq: Every day | ORAL | Status: DC | PRN

## 2022-12-17 MED ORDER — POLYETHYLENE GLYCOL 3350 17 G PO PACK
17.0000 g | PACK | Freq: Two times a day (BID) | ORAL | Status: DC
  Administered 2022-12-17 – 2022-12-22 (×12): 17 g via ORAL
  Filled 2022-12-17 (×12): qty 1

## 2022-12-17 MED ORDER — BISACODYL 10 MG RE SUPP: 10.0000 mg | Freq: Every day | RECTAL | Status: DC | PRN

## 2022-12-17 MED ORDER — BISACODYL 5 MG PO TBEC
10.0000 mg | DELAYED_RELEASE_TABLET | Freq: Once | ORAL | Status: AC
  Administered 2022-12-17: 10 mg via ORAL
  Filled 2022-12-17: qty 2

## 2022-12-17 NOTE — TOC Initial Note (Addendum)
Transition of Care [facility]) - Initial/Assessment Note    Patient Details  Name: Derrick Atkins MRN: [id number] Date of Birth: 07-17-1970  Transition of [facility]) CM/SW Contact:    Margarito Liner, LCSW Phone Number: 12/17/2022, 9:45 AM  Clinical Narrative: CSW called patient's legal guardian through [facility] [place]. CSW introduced role and explained that discharge planning would be discussed. She confirmed that patient is from [facility] and contact person at the facility is [employer]. CSW left voicemail for Ms. Rey to see if patient was getting home health or using DME prior to admission. SDOH flags for transportation and food. Resources added to AVS. No further concerns. CSW encouraged patient to contact CSW as needed. CSW will continue to follow patient for support and facilitate return to [facility] once medically stable.   2:45 pm: No call back from [place]. Tried calling again. Did not leave a second voicemail. Per MD, patient will more than likely be here over the weekend.       Expected Discharge Plan: Group Home Barriers to Discharge: Continued Medical Work up   Patient Goals and CMS Choice            Expected Discharge Plan and Services     Post Acute Care Choice: NA Living arrangements for the past 2 months: Group Home                                      Prior Living Arrangements/Services Living arrangements for the past 2 months: Group Home Lives with:: Facility Resident Patient language and need for interpreter reviewed:: Yes Do you feel safe going back to the place where you live?: Yes      Need for Family Participation in Patient Care: Yes (Comment) Care giver support system in place?: Yes (comment)   Criminal Activity/Legal Involvement Pertinent to Current Situation/Hospitalization: No - Comment as needed  Activities of Daily Living      Permission Sought/Granted Permission sought to share information with : Facility  Medical sales representative, Guardian    Share Information with NAME: Essie Christine Rey  Permission granted to share info w AGENCY: DSS, [facility]  Permission granted to share info w Relationship: Candice: Legal guardian, Lawanda: Group home owner  Permission granted to share info w Contact Information: Candice: [phone], Rowan Blase: [phone]  Emotional Assessment Appearance:: Appears stated age Attitude/Demeanor/Rapport: Unable to Assess Affect (typically observed): Unable to Assess Orientation: : Oriented to Self, Oriented to Place, Oriented to  Time, Oriented to Situation Alcohol / Substance Use: Not Applicable Psych Involvement: No (comment)  Admission diagnosis:  Cellulitis of left lower extremity [L03.116] Cellulitis and abscess of left leg [L03.116, L02.416] Patient Active Problem List   Diagnosis Date Noted   Cellulitis and abscess of left leg 12/13/2022   AKI (acute kidney injury) (HCC) 12/13/2022   Dyslipidemia 12/13/2022   Schizophrenia (HCC) 03/23/2022   Aggressive behavior    Schizophrenia, paranoid, chronic (HCC)    Long QT interval 04/16/2014   Hypothyroidism 12/25/2013   Essential hypertension 05/03/2007   Type 2 diabetes mellitus without complications (HCC) 05/03/2007   PCP:  Gracelyn Nurse, MD Pharmacy:   [facility] - [place], [place] - [address] [place] [address] Phone: [phone] Fax: [phone]  [facility] - [place], [place] - [address] [place]  [address] Phone: [phone] Fax: [phone]     Social Determinants of Health (SDOH) Social History: SDOH Screenings   Food Insecurity: Food Insecurity Present (04/09/2022)  Housing: Low Risk  (03/23/2022)  Transportation Needs: Unmet Transportation Needs (04/09/2022)  Utilities: Not At Risk (03/23/2022)  Alcohol Screen: Low Risk  (03/23/2022)  Tobacco Use: Low Risk  (09/16/2022)   SDOH  Interventions:     Readmission Risk Interventions     No data to display

## 2022-12-17 NOTE — Care Management Important Message (Signed)
Important Message  Patient Details  Name: [name] MRN: [id number] Date of Birth: 1971-05-03   Medicare Important Message Given:  N/A - LOS <3 / Initial given by admissions     Johnell Comings 12/17/2022, 3:18 PM

## 2022-12-17 NOTE — Progress Notes (Signed)
[facility] Progress Note  Patient: Derrick Atkins    ZOX:[id number]  DOA: 12/13/2022     Date of Service: the patient was seen and examined on 12/17/2022  Chief Complaint  Patient presents with   Leg Pain   Brief hospital course: HABEN BILLINGSLEY is a 52 y.o. Caucasian male with medical history significant for type II diabetes mellitus, hypertension, schizoaffective disorder, cerebral palsy and depression, who presented to the emergency room with acute onset of worsening left lower extremity swelling with erythema and, tenderness and pain.  He stated that it has been going on over the last 6 months but got significantly worse over the last 6 days.  Did not notice any drainage though.  No fever but he experienced chills.  No chest pain or dyspnea or cough.  No nausea or vomiting or abdominal pain.  No dysuria, oliguria or hematuria or flank pain.  He did not experience significant elevation of his blood glucose levels.    Assessment and Plan: Principal Problem:   Cellulitis and abscess of left leg Active Problems:   Type 2 diabetes mellitus without complications (HCC)   AKI (acute kidney injury) (HCC)   Essential hypertension   Schizophrenia, paranoid, chronic (HCC)   Hypothyroidism   Dyslipidemia   # Abscess with cellulitis LLE. No DVT on PVL. S/p I and D in the ED with report of marked purulence noted. On 6/4 Dr Ashok Pall was able to express a large amount of purulent bloody fluid from the 1 cm stab incision just above the left ankle.  Culture ordered by ED provider but appears was never collected and/or sent. Patient had a one-time dose of vancomycin but further antibiotics have not been ordered -s/p vancomycin and Zosyn,  wound Cx growing MSSA, so started Ancef 2g q8h on 6/6  blood cultures were ordered but after start of IV antibiotics - would involve general surgery if does not improve over the next day or so  f/u CT LLE to r/o Abscess , still report is pending 6/7  #  AKI Likely prerenal, responding to fluids, 1.56 on presentation, 1.37--1.1 today - continue gentle fluids    # T2DM Normoglycemic here - SSI   # Hypothyroid - home levothyroxine   # Schizophrenia Without behavioral disturbance - cont home zyprexa and buspar and cogentin, is also on monthly abilify IM at home   # HTN Here bp wnl - hold home antihypertensives  # Anemia of chronic disease Iron 42, slight, started oral iron continue with vitamin C. Folic acid 7.7 at lower end, started oral Folic acid  B12 over the limit  # Vitamin D deficiency: started vitamin D 50,000 units p.o. weekly, follow with PCP to repeat vitamin D level after 3 to 6 months.  # Guardianship Lenn Sink is listed as patient's legal guardian, with DSS, patient is a ward of the state. She was updated. Patient resides in a group home. Swentrail Allen-Bird is the Child psychotherapist (also listed in the demographics tab) who works with the patient most closely.   Body mass index is 25.81 kg/m.  Interventions:       Diet: Diabetic diet DVT Prophylaxis: Subcutaneous Lovenox   Advance goals of care discussion: Full code  Family Communication: family was not present at bedside, at the time of interview.  The pt provided permission to discuss medical plan with the family. Opportunity was given to ask question and all questions were answered satisfactorily.   Disposition:  Pt is from group home,  admitted with left lower extremity wound infection, still on IV antibiotics, which precludes a safe discharge. Discharge to group home, when clinically stable, may need few days more IV antibiotics.  Subjective: No significant events overnight, patient does not have pain, left lower extremity is still tender and swollen.  Denies any other active issues.  Physical Exam: General: NAD, lying comfortably Appear in no distress, affect appropriate Eyes: PERRLA ENT: Oral Mucosa Clear, moist  Neck: no JVD,  Cardiovascular:  S1 and S2 Present, no Murmur,  Respiratory: good respiratory effort, Bilateral Air entry equal and Decreased, no Crackles, no wheezes Abdomen: Bowel Sound present, Soft and no tenderness,  Skin: no rashes Extremities: RLEno Pedal edema, no calf tenderness. LLE swelling, erythematous and tender Neurologic: without any new focal findings Gait not checked due to patient safety concerns  Vitals:   12/16/22 0845 12/16/22 2009 12/17/22 0350 12/17/22 0839  BP: (!) 108/48 (!) 118/56 (!) 116/56 (!) 122/48  Pulse: 81 72 78 78  Resp: 18 14 18 20   Temp: 98 F (36.7 C) 98.1 F (36.7 C) 98.6 F (37 C) 97.9 F (36.6 C)  TempSrc:  Oral Oral Oral  SpO2: 99% 100% 100% 100%  Weight:      Height:        Intake/Output Summary (Last 24 hours) at 12/17/2022 1435 Last data filed at 12/17/2022 1158 Gross per 24 hour  Intake 1420 ml  Output 1900 ml  Net -480 ml   Filed Weights   12/13/22 1356  Weight: 96.2 kg    Data Reviewed: I have personally reviewed and interpreted daily labs, tele strips, imagings as discussed above. I reviewed all nursing notes, pharmacy notes, vitals, pertinent old records I have discussed plan of care as described above with RN and patient/family.  CBC: Recent Labs  Lab 12/13/22 1358 12/14/22 0438 12/14/22 1104 12/15/22 0440 12/16/22 0527 12/17/22 0521  WBC 10.5 6.2 7.1 6.0 5.8 6.8  NEUTROABS 8.5*  --   --   --   --   --   HGB 10.5* 7.7* 8.8* 7.7* 7.8* 8.3*  HCT 32.7* 23.8* 27.4* 23.8* 24.2* 25.5*  MCV 88.9 87.5 87.5 86.9 86.4 86.7  PLT 123* 95* 97* 97* 97* 112*   Basic Metabolic Panel: Recent Labs  Lab 12/13/22 1358 12/14/22 0438 12/15/22 0440 12/16/22 0527 12/17/22 0521  NA 140 139 137 139 139  K 3.6 3.4* 3.6 4.0 4.2  CL 106 107 106 108 107  CO2 23 21* 25 25 26   GLUCOSE 121* 138* 92 126* 74  BUN 36* 32* 28* 22* 20  CREATININE 1.56* 1.37* 1.28* 1.07 1.10  CALCIUM 8.6* 7.9* 7.8* 7.9* 8.1*  MG  --   --   --  2.3  --   PHOS  --   --   --  3.8  --      Studies: No results found.  Scheduled Meds:  vitamin C  500 mg Oral Daily   aspirin EC  81 mg Oral Daily   benztropine  1 mg Oral BID   busPIRone  15 mg Oral TID   divalproex  750 mg Oral Q12H   enoxaparin (LOVENOX) injection  40 mg Subcutaneous Q24H   fenofibrate  54 mg Oral Daily   folic acid  1 mg Oral Daily   gemfibrozil  600 mg Oral BID   insulin aspart  0-5 Units Subcutaneous QHS   insulin aspart  0-9 Units Subcutaneous TID WC   iron polysaccharides  150 mg Oral  Daily   levothyroxine  88 mcg Oral Q0600   OLANZapine  30 mg Oral QHS   polyethylene glycol  17 g Oral BID   simvastatin  20 mg Oral QHS   Vitamin D (Ergocalciferol)  50,000 Units Oral Q7 days   Continuous Infusions:   ceFAZolin (ANCEF) IV 2 g (12/17/22 0543)   PRN Meds: acetaminophen **OR** acetaminophen, [START ON 12/18/2022] bisacodyl, bisacodyl, hydrOXYzine, magnesium hydroxide, metoCLOPramide (REGLAN) injection, oxyCODONE, traZODone  Time spent: 35 minutes  Author: Gillis Santa. MD [facility] 12/17/2022 2:35 PM  To reach On-call, see care teams to locate the attending and reach out to them via [email or web address]. If 7PM-7AM, please contact night-coverage If you still have difficulty reaching the attending provider, please page the [facility] (Director on Call) for [facility] on amion for assistance.

## 2022-12-17 NOTE — Discharge Instructions (Signed)
Wound Care:  Cover left lower leg wound with gauze, wrap with rolled gauze, secure and wrap with ACE wrap. This should be changed daily and PRN. He can remove dressing to take a short shower. Do not submerge leg. We will remove this "drain" at his follow up appointment   Transportation Agency Name: [facility] Address: [address] [place], [place] [address] Phone: [phone] Email: [email or web address] Website: [email or web address] Service(s) Offered: [employer], self-sufficiency, congregate meal  program, weatherization program, Field seismologist program, emergency food assistance,  housing counseling, home ownership program, wheels-towork program. November 04, 2016 22  Agency Name: [facility] [employer] [phone]) Address: [address] [place], [place] [address] Phone: [phone] Email:  Website: [email or web address].[email or web address] Service(s) Offered: Transportation for general public, subscription and demand  response; Dial-a-Ride for citizens 28 years of age or older. Agency Name: Department of Social Services Address: [address] [place], [place] [address] Phone: [phone] Service(s) Offered: Child support services; child welfare services; food stamps;  Medicaid; work first family assistance; and aid with fuel,  rent, food and medicine, transportation assistance.  Agency Name: [employer]abled [employer] ([employer]) Transportation  Network Address: Phone: [phone] Service(s) Offered: Transports veterans to the [facility]. Call  forty-eight hours in advance and leave the name, telephone  number, date, and time of appointment. Veteran will be  contacted by the driver the day before the appointment to  arrange a pick up point    Food Resources  Agency Name: [facility] Agency Address: [address] [place], [place] [address] Phone: [phone] Website:  www.[email or web address].org  Service(s) Offered: Housing services, self-sufficiency, congregate meal  program, weatherization program, Field seismologist program, emergency food assistance,  housing counseling, home ownership program, wheels -towork program. Meals free for 60 and older at various  locations from 9am-1pm, Monday-Friday:  [employer], [address] [place], [phone] [facility], [address] [place] [phone]   [facility], [address]   [place] [phone] The [address], [address]   [place], [phone]  Agency Name: [facility] Address: [phone] [address], [place], [place] [address] Phone: [phone] Website: www.[email or web address] Service(s) Offered: Home delivered hot, frozen, and emergency  meals. Grocery assistance program which matches  volunteers one-on-one with seniors unable to grocery shop  for themselves. Must be 60 years and older; less than 20  hours of in-home aide service, limited or no driving ability;  live alone or with someone with a disability; live in  [place].  Agency Name: [facility] [employer]) Address: [address], [place], [place] [address] Phone: [phone] Service(s) Offered: Food is served to shut-ins, homeless, elderly, and low  income people in the community every Saturday (11:30  am-12:30 pm) and Sunday (12:30 pm-1:30pm). Volunteers  also offer help and encouragement in seeking employment,  and spiritual guidance. November 04, 2016 8  Agency Name: Department of Social Services Address: [address] [place], [place] [address] Phone: [phone] Service(s) Offered: Child support services; child welfare services; food stamps;  Medicaid; work first family assistance; and aid with fuel,  rent, food and medicine.  Agency Name: [employer]itian Address: [address] [place], [place] Phone: [phone] Website: www.dreamalign.com Services Offered: Monday 10:00am-12:00, 8:00pm-9:00pm, and Friday  10:00am-12:00. Agency Name: [employer] [facility] [place] Address: [address] [place], [place] [address] Phone: [phone] Website: [email or web address] Service(s) Offered: Serves weekday meals, open from 11:30  am- 1:00 pm., and  6:30-7:30pm, Monday-Wednesday-Friday distributes food  3:30-6pm, Monday-Wednesday-Friday.  Agency Name: [facility] Address: [address] [place], [place] Phone: [phone] Website: [email or web address] Services Offered: Distributes food the 4th Saturday of the month, starting at  8:00 am Agency Name: [facility] Address: [phone] [address] [place], [place] [address] Phone: [phone] Website: [email or web address] Service(s) Offered: Bread of life, weekly food pantry. Open Wednesdays from  10:00am-noon.  Agency Name: [facility] [employer] Address: [address] [place], [place], [place] Phone: [phone] Services Offered: Distributes food 9am-1pm, Monday-Thursday. Call for details.   Agency Name: [facility] Address: [address], [place], [place] [address] Phone: [phone] Website: [email or web address] Service(s) Offered: Games developer. Call for assistance. Agency Name: [facility] Address: [address] [place], [place] [address] Phone: [phone] Service Offered: Emergency Food Pantry. Call for appointment.  Agency Name: [facility] Address: [address], [place], [place] [address] Phone: [phone] Website: [email or web address] Services Offered: Games developer. Call for details Agency Name: [facility] Address: [address] [place], [place] Phone: [phone] Website: [email or web address] Service(s) Offered: Emergency Food Pantry. Call for  details.  Agency Name: Holiday representative Address: [address], [place], [place] [address] Phone: [phone] or [phone] Website: [email or web address] Service(s) Offered: Distribute food 9am-11:30 am, Tuesday-Friday, and 1- 3:30pm, Monday-Friday. Food pantry Monday-Friday  1pm-3pm, fresh items, Mon.-Wed.-Fri.  Agency Name: [facility] Address: [address] [place], [place] [address] Phone: [phone] Website: www.[email or web address].org Services Offered: Distribute food Tues and Sats from 9:00am-noon. Closed  1st Saturday of each month. Call for details

## 2022-12-17 NOTE — Progress Notes (Signed)
Hypoglycemic Event  CBG: 61  Treatment: 8 oz juice/soda  Symptoms: None  Follow-up CBG: Time 0908 CBG Result:73  Possible Reasons for Event: Inadequate meal intake  Comments/MD notified:     Shavon Zenz N Joella Saefong

## 2022-12-18 DIAGNOSIS — L03116 Cellulitis of left lower limb: Secondary | ICD-10-CM | POA: Diagnosis not present

## 2022-12-18 DIAGNOSIS — L02416 Cutaneous abscess of left lower limb: Secondary | ICD-10-CM | POA: Diagnosis not present

## 2022-12-18 LAB — GLUCOSE, CAPILLARY
Glucose-Capillary: 83 mg/dL (ref 70–99)
Glucose-Capillary: 62 mg/dL — ABNORMAL LOW (ref 70–99)
Glucose-Capillary: 124 mg/dL — ABNORMAL HIGH (ref 70–99)
Glucose-Capillary: 122 mg/dL — ABNORMAL HIGH (ref 70–99)
Glucose-Capillary: 200 mg/dL — ABNORMAL HIGH (ref 70–99)
Glucose-Capillary: 169 mg/dL — ABNORMAL HIGH (ref 70–99)

## 2022-12-18 LAB — BASIC METABOLIC PANEL
Anion gap: 9 (ref 5–15)
BUN: 21 mg/dL — ABNORMAL HIGH (ref 6–20)
CO2: 27 mmol/L (ref 22–32)
Calcium: 8 mg/dL — ABNORMAL LOW (ref 8.9–10.3)
Chloride: 102 mmol/L (ref 98–111)
Creatinine, Ser: 1.02 mg/dL (ref 0.61–1.24)
GFR, Estimated: >60 mL/min (ref >60)
Glucose, Bld: 99 mg/dL (ref 70–99)
Potassium: 4.1 mmol/L (ref 3.5–5.1)
Sodium: 138 mmol/L (ref 135–145)

## 2022-12-18 LAB — CBC
HCT: 24.8 % — ABNORMAL LOW (ref 39.0–52.0)
Hemoglobin: 8 g/dL — ABNORMAL LOW (ref 13.0–17.0)
MCH: 27.9 pg (ref 26.0–34.0)
MCHC: 32.3 g/dL (ref 30.0–36.0)
MCV: 86.4 fL (ref 80.0–100.0)
Platelets: 101 10*3/uL — ABNORMAL LOW (ref 150–400)
RBC: 2.87 MIL/uL — ABNORMAL LOW (ref 4.22–5.81)
RDW: 14.3 % (ref 11.5–15.5)
WBC: 6.4 10*3/uL (ref 4.0–10.5)
nRBC: 0 % (ref 0.0–0.2)

## 2022-12-18 LAB — CULTURE, BLOOD (ROUTINE X 2): Culture: NO GROWTH

## 2022-12-18 NOTE — Progress Notes (Signed)
Hypoglycemic Event  CBG: 62  Treatment: 8 oz juice/soda  Symptoms: None  Follow-up CBG: Time:1017 CBG Result:124  Possible Reasons for Event: Unknown  Comments/MD notified:    Derrick Atkins N Derrick Atkins

## 2022-12-18 NOTE — Progress Notes (Addendum)
Progress Note   Patient: Derrick Atkins ZOX:[id number] DOB: Dec 26, 1970 DOA: 12/13/2022     5 DOS: the patient was seen and examined on 12/18/2022   Brief hospital course: Derrick Atkins is a 52 y.o. Caucasian male with medical history significant for type II diabetes mellitus, hypertension, schizoaffective disorder, cerebral palsy and depression, who presented to the emergency room with acute onset of worsening left lower extremity swelling with erythema and, tenderness and pain.  He stated that it has been going on over the last 6 months but got significantly worse over the last 6 days.  Did not notice any drainage though.  No fever but he experienced chills.  No chest pain or dyspnea or cough.  No nausea or vomiting or abdominal pain.  No dysuria, oliguria or hematuria or flank pain.  He did not experience significant elevation of his blood glucose levels. He is being treated with iv antibiotics after I and D that was done on admission in the ED.  Assessment and Plan:  Abscess with cellulitis LLE. No DVT on PVL. S/p I and D in the ED with report of marked purulence noted. On 6/4 Dr Ashok Pall was able to express a large amount of purulent bloody fluid from the 1 cm stab incision just above the left ankle.  Culture ordered by ED provider but appears was never collected and/or sent. Patient had a one-time dose of vancomycin but further antibiotics have not been ordered -s/p vancomycin and Zosyn,  wound Cx growing MSSA, continue Ancef 2g q8h on 6/6  blood cultures were ordered but after start of IV antibiotics has NGTD  CT of the left Tibia and fibula  showed no evidence of abscess and there was moderate to severe fat layer edema of the left leg to ankle  AKI resolved Likely prerenal, responded to fluids, 1.56 on presentation, 1.37--1.02 on 12/18/2022     T2DM hb aic of 4.7 6/3 Normoglycemic here - SSI   Hypothyroid - home levothyroxine   Schizophrenia Without behavioral disturbance - cont  home zyprexa and buspar and cogentin, is also on monthly abilify IM at home   HTN Here bp wnl - consider restarting  home antihypertensives if bp is elevated at discharge otherwise adjust antibiotics as needed    Anemia of chronic disease hb of 8.0 on 12/18/2022 Iron 42, transferrin saturation of 21%,continue oral iron continue with vitamin C. Folic acid 7.7 at lower end, continue oral Folic acid  B12 over the limit   Vitamin D deficiency: continue vitamin D 50,000 units p.o. weekly, follow with PCP to repeat vitamin D level after 3 to 6 months.   Guardianship Candice Abran Cantor is listed as patient's legal guardian, with DSS, patient is a ward of the state. Patient resides in a group home. Swentrail Allen-Bird is the Child psychotherapist (also listed in the demographics tab) who works with the patient most closely.   -thrombocytopenia Platelet count of  above 100,000.  Continue pharmacologic DVT ppx     Subjective: Seen patient at the bedside.  Patient was wondering whether he could order lunch. And when he could go home.He seemed confused to me but this could be his baseline.     Physical Exam: Vitals:   12/17/22 1638 12/17/22 2005 12/18/22 0426 12/18/22 0806  BP: (!) 117/43 114/73 114/61 (!) 116/48  Pulse: 83 83 79 77  Resp: 20 18 18 16   Temp: 97.7 F (36.5 C) 98.4 F (36.9 C) 98.3 F (36.8 C) 97.9 F (36.6 C)  TempSrc: Oral  Oral   SpO2: 99% 100% 100% 97%  Weight:      Height:      physical Exam: General: NAD, lying comfortably Appear in no distress, affect appropriate Eyes: PERRLA ENT: Oral Mucosa Clear, moist  Neck: no JVD,  Cardiovascular: S1 and S2 Present, no Murmur,  Respiratory: good respiratory effort, Bilateral Air entry equal and Decreased, no Crackles, no wheezes Abdomen: Bowel Sound present, Soft and no tenderness,  Skin: no rashes Extremities: RLEno Pedal edema, no calf tenderness. LLE swelling, erythematous and tender with bandage. Neurologic: without any new  focal findings Gait not checked due to patient safety concerns    Data Reviewed:     Family Communication:  I have updated Legal Guardian Candice Gobble  over the phone and all questions answered I told her that he needs a few more days of iv antibiotic therapy  Disposition: Status is: Inpatient Remains inpatient appropriate because: needs iv antibiotics every 8 hrs  Planned Discharge Destination:  group home    Time spent: 35 minutes  Author: Florencia Reasons, MD 12/18/2022 1:52 PM  For on call review [email or web address].

## 2022-12-19 DIAGNOSIS — L03116 Cellulitis of left lower limb: Secondary | ICD-10-CM | POA: Diagnosis not present

## 2022-12-19 DIAGNOSIS — L02416 Cutaneous abscess of left lower limb: Secondary | ICD-10-CM | POA: Diagnosis not present

## 2022-12-19 LAB — BASIC METABOLIC PANEL
Anion gap: 8 (ref 5–15)
BUN: 19 mg/dL (ref 6–20)
CO2: 29 mmol/L (ref 22–32)
Calcium: 8.1 mg/dL — ABNORMAL LOW (ref 8.9–10.3)
Chloride: 102 mmol/L (ref 98–111)
Creatinine, Ser: 0.92 mg/dL (ref 0.61–1.24)
GFR, Estimated: >60 mL/min (ref >60)
Glucose, Bld: 98 mg/dL (ref 70–99)
Potassium: 4.4 mmol/L (ref 3.5–5.1)
Sodium: 139 mmol/L (ref 135–145)

## 2022-12-19 LAB — GLUCOSE, CAPILLARY
Glucose-Capillary: 67 mg/dL — ABNORMAL LOW (ref 70–99)
Glucose-Capillary: 110 mg/dL — ABNORMAL HIGH (ref 70–99)
Glucose-Capillary: 108 mg/dL — ABNORMAL HIGH (ref 70–99)
Glucose-Capillary: 92 mg/dL (ref 70–99)
Glucose-Capillary: 137 mg/dL — ABNORMAL HIGH (ref 70–99)

## 2022-12-19 LAB — CULTURE, BLOOD (ROUTINE X 2)

## 2022-12-19 NOTE — Progress Notes (Addendum)
Progress Note   Patient: Derrick Atkins ZOX:[id number] DOB: November 06, 1970 DOA: 12/13/2022     6 DOS: the patient was seen and examined on 12/19/2022   Brief hospital course: Derrick Atkins is a 52 y.o. Caucasian male with medical history significant for type II diabetes mellitus, hypertension, schizoaffective disorder, cerebral palsy and depression, who presented to the emergency room with acute onset of worsening left lower extremity swelling with erythema and, tenderness and pain.  He stated that it has been going on over the last 6 months but got significantly worse over the last 6 days.  Did not notice any drainage though.  No fever but he experienced chills.  No chest pain or dyspnea or cough.  No nausea or vomiting or abdominal pain.  No dysuria, oliguria or hematuria or flank pain.  He did not experience significant elevation of his blood glucose levels. He is being treated with iv antibiotics after I and D that was done on admission in the ED.  Assessment and Plan:  Abscess with cellulitis LLE. No DVT on PVL. S/p I and D in the ED with report of marked purulence noted. On 6/4 Dr Ashok Pall was able to express a large amount of purulent bloody fluid from the 1 cm stab incision just above the left ankle.  Culture ordered by ED provider but appears was never collected and/or sent. Patient had a one-time dose of vancomycin but further antibiotics have not been ordered -s/p vancomycin and Zosyn,  wound Cx growing MSSA, continue Ancef 2g q8h on 6/6  blood cultures were ordered but after start of IV antibiotics has NGTD  CT of the left Tibia and fibula  showed no evidence of abscess and there was moderate to severe fat layer edema of the left leg to ankle Cellulitis seem to be improving  AKI resolved Likely prerenal, responded to fluids, 1.56 on presentation, 1.37--1.02 on 12/18/2022     T2DM hb aic of 4.7 6/3 Normoglycemic here - SSI   Hypothyroid - home levothyroxine   Schizophrenia Without  behavioral disturbance - cont home zyprexa and buspar and cogentin, is also on monthly abilify IM at home   HTN Here bp wnl - consider restarting  home antihypertensives if bp is elevated at discharge otherwise adjust antibiotics as needed    Anemia of chronic disease hb of 8.0 on 12/18/2022 Iron 42, transferrin saturation of 21%,continue oral iron continue with vitamin C. Folic acid 7.7 at lower end, continue oral Folic acid  B12 over the limit   Vitamin D deficiency: continue vitamin D 50,000 units p.o. weekly, follow with PCP to repeat vitamin D level after 3 to 6 months.   Guardianship Derrick Atkins is listed as patient's legal guardian, with DSS, patient is a ward of the state. Patient resides in a group home. Derrick Atkins is the Child psychotherapist (also listed in the demographics tab) who works with the patient most closely.   -thrombocytopenia Platelet count of  above 100,000.  Continue pharmacologic DVT ppx     Subjective: Seen patient at the bedside.No new events today     Physical Exam: Vitals:   12/18/22 2033 12/19/22 0405 12/19/22 0754 12/19/22 1614  BP: (!) 117/47 (!) 103/56 (!) 97/56 (!) 103/57  Pulse: 85 91 95 86  Resp: 16 16 16 20   Temp: 98 F (36.7 C) (!) 97.5 F (36.4 C) 98.4 F (36.9 C) 97.9 F (36.6 C)  TempSrc: Oral Oral Oral   SpO2: 100% 99% 99% 100%  Weight:  Height:      physical Exam: General: NAD, lying comfortably Appear in no distress, affect appropriate Eyes: PERRLA ENT: Oral Mucosa Clear, moist  Neck: no JVD,  Cardiovascular: S1 and S2 Present, no Murmur,  Respiratory: good respiratory effort, Bilateral Air entry equal and Decreased, no Crackles, no wheezes Abdomen: Bowel Sound present, Soft and no tenderness,  Skin: no rashes Extremities: RLEno Pedal edema, no calf tenderness. LLE swelling, erythematous and tender with bandage. Neurologic: without any new focal findings Gait not checked due to patient safety concerns     Data Reviewed:     Family Communication:  I have updated Legal Guardian Derrick Atkins  over the phone and all questions answered I told her that he needs a few more days of iv antibiotic therapy  Disposition: Status is: Inpatient Remains inpatient appropriate because: needs iv antibiotics every 8 hrs  Planned Discharge Destination:  group home    Time spent: 36 minutes  Author: Florencia Reasons, MD 12/19/2022 4:39 PM  For on call review [email or web address].

## 2022-12-19 NOTE — Progress Notes (Signed)
Hypoglycemic Event  CBG: 67  Treatment: 8 oz juice/soda  Symptoms: None  Follow-up CBG: Time:0935 CBG Result:110  Possible Reasons for Event: Unknown  Comments/MD notified: Eat breakfast    Agapito Hanway N Tyjae Issa

## 2022-12-20 ENCOUNTER — Encounter
Admission: EM | Disposition: A | Discharge status: Home / Self Care | Payer: Self-pay | Source: Home / Self Care | Attending: Student

## 2022-12-20 ENCOUNTER — Inpatient Hospital Stay: Payer: 59 | Admitting: Anesthesiology

## 2022-12-20 ENCOUNTER — Encounter: Payer: Self-pay | Admitting: Family Medicine

## 2022-12-20 ENCOUNTER — Inpatient Hospital Stay: Payer: 59

## 2022-12-20 ENCOUNTER — Other Ambulatory Visit: Payer: Self-pay

## 2022-12-20 DIAGNOSIS — L03116 Cellulitis of left lower limb: Secondary | ICD-10-CM | POA: Diagnosis not present

## 2022-12-20 DIAGNOSIS — L02416 Cutaneous abscess of left lower limb: Secondary | ICD-10-CM | POA: Diagnosis not present

## 2022-12-20 HISTORY — PX: INCISION AND DRAINAGE ABSCESS: SHX5864

## 2022-12-20 LAB — CBC
HCT: 23.8 % — ABNORMAL LOW (ref 39.0–52.0)
Hemoglobin: 7.9 g/dL — ABNORMAL LOW (ref 13.0–17.0)
MCH: 28.4 pg (ref 26.0–34.0)
MCHC: 33.2 g/dL (ref 30.0–36.0)
MCV: 85.6 fL (ref 80.0–100.0)
Platelets: 117 10*3/uL — ABNORMAL LOW (ref 150–400)
RBC: 2.78 MIL/uL — ABNORMAL LOW (ref 4.22–5.81)
RDW: 14.4 % (ref 11.5–15.5)
WBC: 7.6 10*3/uL (ref 4.0–10.5)
nRBC: 0 % (ref 0.0–0.2)

## 2022-12-20 LAB — BASIC METABOLIC PANEL
Anion gap: 7 (ref 5–15)
BUN: 21 mg/dL — ABNORMAL HIGH (ref 6–20)
CO2: 28 mmol/L (ref 22–32)
Calcium: 8 mg/dL — ABNORMAL LOW (ref 8.9–10.3)
Chloride: 101 mmol/L (ref 98–111)
Creatinine, Ser: 1 mg/dL (ref 0.61–1.24)
GFR, Estimated: >60 mL/min (ref >60)
Glucose, Bld: 169 mg/dL — ABNORMAL HIGH (ref 70–99)
Potassium: 4.2 mmol/L (ref 3.5–5.1)
Sodium: 136 mmol/L (ref 135–145)

## 2022-12-20 LAB — GLUCOSE, CAPILLARY
Glucose-Capillary: 147 mg/dL — ABNORMAL HIGH (ref 70–99)
Glucose-Capillary: 176 mg/dL — ABNORMAL HIGH (ref 70–99)
Glucose-Capillary: 108 mg/dL — ABNORMAL HIGH (ref 70–99)
Glucose-Capillary: 76 mg/dL (ref 70–99)
Glucose-Capillary: 138 mg/dL — ABNORMAL HIGH (ref 70–99)

## 2022-12-20 SURGERY — INCISION AND DRAINAGE, ABSCESS
Anesthesia: General | Laterality: Left

## 2022-12-20 MED ORDER — ACETAMINOPHEN 10 MG/ML IV SOLN: 1000.0000 mg | Freq: Once | INTRAVENOUS | Status: DC | PRN

## 2022-12-20 MED ORDER — OXYCODONE HCL 5 MG/5ML PO SOLN: 5.0000 mg | Freq: Once | ORAL | Status: DC | PRN

## 2022-12-20 MED ORDER — ACETAMINOPHEN 10 MG/ML IV SOLN
INTRAVENOUS | Status: AC
  Filled 2022-12-20: qty 100

## 2022-12-20 MED ORDER — PROPOFOL 10 MG/ML IV BOLUS
INTRAVENOUS | Status: AC
  Filled 2022-12-20: qty 20

## 2022-12-20 MED ORDER — PROPOFOL 500 MG/50ML IV EMUL
INTRAVENOUS | Status: DC | PRN
  Administered 2022-12-20: 50 ug/kg/min via INTRAVENOUS

## 2022-12-20 MED ORDER — ACETAMINOPHEN 10 MG/ML IV SOLN
1000.0000 mg | Freq: Once | INTRAVENOUS | Status: AC
  Administered 2022-12-20: 1000 mg via INTRAVENOUS

## 2022-12-20 MED ORDER — FENTANYL CITRATE (PF) 100 MCG/2ML IJ SOLN
INTRAMUSCULAR | Status: AC
  Filled 2022-12-20: qty 2

## 2022-12-20 MED ORDER — BUPIVACAINE HCL (PF) 0.25 % IJ SOLN
INTRAMUSCULAR | Status: AC
  Filled 2022-12-20: qty 30

## 2022-12-20 MED ORDER — SODIUM CHLORIDE 0.9 % IV SOLN: INTRAVENOUS | Status: DC

## 2022-12-20 MED ORDER — MIDAZOLAM HCL 2 MG/2ML IJ SOLN
INTRAMUSCULAR | Status: AC
  Filled 2022-12-20: qty 2

## 2022-12-20 MED ORDER — LACTATED RINGERS IV SOLN: INTRAVENOUS | Status: DC

## 2022-12-20 MED ORDER — MIDAZOLAM HCL 2 MG/2ML IJ SOLN
INTRAMUSCULAR | Status: DC | PRN
  Administered 2022-12-20: 2 mg via INTRAVENOUS

## 2022-12-20 MED ORDER — ORAL CARE MOUTH RINSE: 15.0000 mL | Freq: Once | OROMUCOSAL | Status: AC

## 2022-12-20 MED ORDER — CHLORHEXIDINE GLUCONATE 0.12 % MT SOLN
OROMUCOSAL | Status: AC
  Filled 2022-12-20: qty 15

## 2022-12-20 MED ORDER — FENTANYL CITRATE (PF) 100 MCG/2ML IJ SOLN: 25.0000 ug | INTRAMUSCULAR | Status: DC | PRN

## 2022-12-20 MED ORDER — EPINEPHRINE PF 1 MG/ML IJ SOLN
INTRAMUSCULAR | Status: AC
  Filled 2022-12-20: qty 1

## 2022-12-20 MED ORDER — IOHEXOL 300 MG/ML  SOLN
100.0000 mL | Freq: Once | INTRAMUSCULAR | Status: AC | PRN
  Administered 2022-12-20: 100 mL via INTRAVENOUS

## 2022-12-20 MED ORDER — OXYCODONE HCL 5 MG PO TABS: 5.0000 mg | ORAL_TABLET | Freq: Once | ORAL | Status: DC | PRN

## 2022-12-20 MED ORDER — PROPOFOL 10 MG/ML IV BOLUS
INTRAVENOUS | Status: DC | PRN
  Administered 2022-12-20: 50 mg via INTRAVENOUS

## 2022-12-20 MED ORDER — DEXMEDETOMIDINE HCL IN NACL 80 MCG/20ML IV SOLN
INTRAVENOUS | Status: DC | PRN
  Administered 2022-12-20: 4 ug via INTRAVENOUS
  Administered 2022-12-20: 8 ug via INTRAVENOUS

## 2022-12-20 MED ORDER — 0.9 % SODIUM CHLORIDE (POUR BTL) OPTIME
TOPICAL | Status: DC | PRN
  Administered 2022-12-20: 500 mL

## 2022-12-20 MED ORDER — CHLORHEXIDINE GLUCONATE 0.12 % MT SOLN
15.0000 mL | Freq: Once | OROMUCOSAL | Status: AC
  Administered 2022-12-20: 15 mL via OROMUCOSAL

## 2022-12-20 MED ORDER — BUPIVACAINE-EPINEPHRINE 0.25% -1:200000 IJ SOLN
INTRAMUSCULAR | Status: DC | PRN
  Administered 2022-12-20: 10 mL

## 2022-12-20 MED ORDER — FENTANYL CITRATE (PF) 100 MCG/2ML IJ SOLN
INTRAMUSCULAR | Status: DC | PRN
  Administered 2022-12-20 (×2): 25 ug via INTRAVENOUS
  Administered 2022-12-20: 50 ug via INTRAVENOUS

## 2022-12-20 MED ORDER — CEFAZOLIN SODIUM-DEXTROSE 2-4 GM/100ML-% IV SOLN
INTRAVENOUS | Status: AC
  Filled 2022-12-20: qty 100

## 2022-12-20 SURGICAL SUPPLY — 34 items
BLADE SURG 15 STRL LF DISP TIS (BLADE) ×1 IMPLANT
BLADE SURG 15 STRL SS (BLADE) ×1
CATH FOLEY 2WAY 5CC 16FR (CATHETERS) ×1
CATH URTH 16FR FL 2W BLN LF (CATHETERS) IMPLANT
DRAIN PENROSE 12X.25 LTX STRL (MISCELLANEOUS) IMPLANT
DRAIN PENROSE 5/8X18 LTX STRL (DRAIN) IMPLANT
DRAPE LAPAROTOMY 77X122 PED (DRAPES) ×1 IMPLANT
DRSG AQUACEL ADVANTAGE 4X5 (GAUZE/BANDAGES/DRESSINGS) IMPLANT
ELECT CAUTERY BLADE 6.4 (BLADE) ×1 IMPLANT
ELECT REM PT RETURN 9FT ADLT (ELECTROSURGICAL) ×1
ELECTRODE REM PT RTRN 9FT ADLT (ELECTROSURGICAL) ×1 IMPLANT
GAUZE SPONGE 4X4 12PLY STRL (GAUZE/BANDAGES/DRESSINGS) IMPLANT
GLOVE ORTHO TXT STRL SZ7.5 (GLOVE) ×1 IMPLANT
GOWN STRL REUS W/ TWL LRG LVL3 (GOWN DISPOSABLE) ×1 IMPLANT
GOWN STRL REUS W/ TWL XL LVL3 (GOWN DISPOSABLE) ×1 IMPLANT
GOWN STRL REUS W/TWL LRG LVL3 (GOWN DISPOSABLE) ×1
GOWN STRL REUS W/TWL XL LVL3 (GOWN DISPOSABLE) ×1
KIT TURNOVER KIT A (KITS) ×1 IMPLANT
LOOP VESSEL MAXI 1X406 RED (MISCELLANEOUS) IMPLANT
MANIFOLD NEPTUNE II (INSTRUMENTS) ×1 IMPLANT
NDL HYPO 22X1.5 SAFETY MO (MISCELLANEOUS) ×1 IMPLANT
NEEDLE HYPO 22X1.5 SAFETY MO (MISCELLANEOUS) ×1 IMPLANT
NS IRRIG 1000ML POUR BTL (IV SOLUTION) ×1 IMPLANT
PACK BASIN MINOR ARMC (MISCELLANEOUS) ×1 IMPLANT
PAD ABD DERMACEA PRESS 5X9 (GAUZE/BANDAGES/DRESSINGS) IMPLANT
SOL PREP PVP 2OZ (MISCELLANEOUS) ×1
SOLUTION PREP PVP 2OZ (MISCELLANEOUS) ×1 IMPLANT
SPONGE T-LAP 18X18 ~~LOC~~+RFID (SPONGE) IMPLANT
SUT ETHILON 2 0 FS 18 (SUTURE) IMPLANT
SUT ETHILON 3-0 FS-10 30 BLK (SUTURE)
SUTURE EHLN 3-0 FS-10 30 BLK (SUTURE) IMPLANT
SWAB CULTURE AMIES ANAERIB BLU (MISCELLANEOUS) IMPLANT
SYR 10ML LL (SYRINGE) ×1 IMPLANT
TRAP FLUID SMOKE EVACUATOR (MISCELLANEOUS) ×1 IMPLANT

## 2022-12-20 NOTE — Anesthesia Preprocedure Evaluation (Addendum)
Anesthesia Evaluation  Patient identified by MRN, date of birth, ID band Patient awake    Reviewed: Allergy & Precautions, NPO status , Patient's Chart, lab work & pertinent test results  History of Anesthesia Complications Negative for: history of anesthetic complications  Airway Mallampati: I   Neck ROM: Full    Dental  (+) Poor Dentition, Chipped, Missing   Pulmonary neg pulmonary ROS   Pulmonary exam normal breath sounds clear to auscultation       Cardiovascular hypertension, Normal cardiovascular exam Rhythm:Regular Rate:Normal     Neuro/Psych  PSYCHIATRIC DISORDERS  Depression  Schizophrenia  Cerebral palsy; scoliosis  Neuromuscular disease (diabetic polyneuropathy)    GI/Hepatic negative GI ROS,,,  Endo/Other  diabetes, Type 2    Renal/GU Renal disease (nephrolithiasis)     Musculoskeletal   Abdominal   Peds  Hematology negative hematology ROS (+)   Anesthesia Other Findings   Reproductive/Obstetrics                             Anesthesia Physical Anesthesia Plan  ASA: 3  Anesthesia Plan: General   Post-op Pain Management:    Induction: Intravenous  PONV Risk Score and Plan: 2 and Dexamethasone, Treatment may vary due to age or medical condition, Propofol infusion and TIVA  Airway Management Planned: Natural Airway  Additional Equipment:   Intra-op Plan:   Post-operative Plan:   Informed Consent: I have reviewed the patients History and Physical, chart, labs and discussed the procedure including the risks, benefits and alternatives for the proposed anesthesia with the patient or authorized representative who has indicated his/her understanding and acceptance.     Dental advisory given and Consent reviewed with POA  Plan Discussed with: CRNA  Anesthesia Plan Comments: (Plan for GA with natural airway with GETA/LMA backup. Patient's legal guardian [employer]  consented via phone for risks of anesthesia including but not limited to:  - adverse reactions to medications - damage to eyes, teeth, lips or other oral mucosa - nerve damage due to positioning  - sore throat or hoarseness - damage to heart, brain, nerves, lungs, other parts of body or loss of life  Informed legal guardian about role of CRNA in peri- and intra-operative care; she voiced understanding.)        Anesthesia Quick Evaluation

## 2022-12-20 NOTE — Op Note (Signed)
Incision and drainage, with excisional debridement of complex distal left anterior leg necrotizing abscess.    Pre-operative Diagnosis: Sensitive staph aureus abscess with cellulitis of anterior distal left leg.  Post-operative Diagnosis: same.    Surgeon: Campbell Lerner, M.D., FACS  Anesthesia: MAC  Findings: Residual purulent drainage of cavity, along with evidence of liquefactive necrosis of adjacent soft tissues.  Estimated Blood Loss: 25 mL         Specimens: No new cultures or tissue specimens collected for repeat culture.          Complications: none              Procedure Details  The patient was seen again in the Holding Room. The benefits, complications, treatment options, and expected outcomes were discussed with the patient. The risks of bleeding, infection, recurrence of symptoms, failure to resolve symptoms, unanticipated injury, prosthetic placement, prosthetic infection, any of which could require further surgery were reviewed with the patient. The likelihood of improving the patient's symptoms with return to their baseline status is part of this surgical process, but may require additional procedures/debridement.  The patient and/or family concurred with the proposed plan, giving informed consent.  The patient was taken to Operating Room, identified and the procedure verified.    Prior to the induction of general anesthesia, antibiotic prophylaxis was administered. VTE prophylaxis was in place. MAC was then administered and tolerated well. After the induction, the patient was positioned in the supine position and the left leg and foot was prepped with Chloraprep and draped in the sterile fashion.  A Time Out was held and the above information confirmed.  Local infiltration of quarter percent Marcaine with epinephrine is utilized to the incision on the distal left leg, and to the cephalad lying cavity and circumferentially to it to obtain adequate anesthetic effect.   Explored the wound, and was able to identify a significant volume of purulent material still with in this cavity, along with evidence of liquefactive necrosis.  I made a counterincision along the axis of the leg at the cephalad most extent of the cavity.  I then explored the wound with a medium sized bone curette, excisionally debriding the necrotic soft tissues of the cavity.  We irrigated the cavity multiple times with normal saline solution and reexplored the wound ensuring absolutely every small recess could be draining to and communicating with this primary cavity.  The width of the cavity was approximately 6 or more centimeters.  Due to his latex allergy we thought about using Vesseloops and lieu of a Penrose.  I subsequently decided to utilize a latex free 16 French Foley catheter.  I passed it through the 2 wounds, and tied them to each other with 2-0 nylon suture, so as to provide a longer acting conduit for drainage. I then reapplied some Aquacel Ag to the incision, layers of gauze, ABD pad, Kerlix wrap and the Ace wrap to secure at all. Tolerated procedure well, was transferred to recovery room in stable condition.   Campbell Lerner M.D., [facility] 12/20/2022 6:02 PM

## 2022-12-20 NOTE — Transfer of Care (Signed)
Immediate Anesthesia Transfer of Care Note  Patient: Derrick Atkins  Procedure(s) Performed: INCISION AND DRAINAGE ABSCESS (Left)  Patient Location: PACU  Anesthesia Type:General  Level of Consciousness: drowsy and patient cooperative  Airway & Oxygen Therapy: Patient Spontanous Breathing and Patient connected to face mask oxygen  Post-op Assessment: Report given to RN and Post -op Vital signs reviewed and stable  Post vital signs: Reviewed and stable  Last Vitals:  Vitals Value Taken Time  BP 95/45 12/20/22 1803  Temp 37.1 C 12/20/22 1803  Pulse 67 12/20/22 1805  Resp 20 12/20/22 1807  SpO2 100 % 12/20/22 1805  Vitals shown include unvalidated device data.  Last Pain:  Vitals:   12/20/22 1541  TempSrc: Temporal  PainSc: 0-No pain      Patients Stated Pain Goal: 0 (12/17/22 0531)  Complications: No notable events documented.

## 2022-12-20 NOTE — Consult Note (Signed)
[facility] SURGICAL CONSULTATION NOTE (initial) - cpt: 99253   HISTORY OF PRESENT ILLNESS (HPI):  Patient with history of schizoaffective disorder, somewhat poor historian, and unable to contribute much this morning. Primary history obtained through chart review and discussion with other members of his care team.   52 y.o. male presented to [facility] ED initially on 06/03 for evaluation of left lower leg pain. This morning, he is unable to tell me how long he has been having pain or swelling to this left lower leg. He reports "he can not remember much." Seems that on presentation he had been having around 1 week of swelling, pain, and redness to his LLE just above the ankle. Does not appear to have had fever, chills,. No reported injuries to this area. He did undergo bedside I&D with EDP. Culture from this ultimately grew MSSA. He is currently on Ancef. Most recent labs revealed a normal WBC at 7.6K, Hgb to 7.9 which is stable, sCr normal at 1.00, no electrolyte derangements. He did have CT Tib/Fib on admission which showed soft tissue edema, no gross abscess, no subcutaneous emphysema.   Surgery is consulted by hospitalist physician Dr. Noah Wouk, MD in this context for evaluation and management of left lower extremity celullitis and abscess.  PAST MEDICAL HISTORY (PMH):  Past Medical History:  Diagnosis Date   Cerebral palsy (HCC)    Depression    Diabetes mellitus without complication (HCC)    Hypertension    Kidney stones    Schizoaffective disorder (HCC)    Scoliosis      PAST SURGICAL HISTORY (PSH):  Past Surgical History:  Procedure Laterality Date   BOWEL RESECTION     CATARACT EXTRACTION W/PHACO Right 09/15/2022   Procedure: CATARACT EXTRACTION PHACO AND INTRAOCULAR LENS PLACEMENT (IOC) RIGHT DIABETIC VISION BLUE HEALON 5  15.79  01:31.9;  Surgeon: Brasington, Chadwick, MD;  Location: [facility];  Service: Ophthalmology;  Laterality: Right;  Latex Diabetic    LAPAROTOMY N/A 05/15/2020   Procedure: EXPLORATORY LAPAROTOMY;  Surgeon: Piscoya, Jose, MD;  Location: [facility]S;  Service: General;  Laterality: N/A;     MEDICATIONS:  Prior to Admission medications   Medication Sig Start Date End Date Taking? Authorizing Provider  ABILIFY MAINTENA 400 MG PRSY prefilled syringe Inject 400 mg into the muscle every 28 (twenty-eight) days.   Yes [provider]  albuterol (VENTOLIN HFA) 108 (90 Base) MCG/ACT inhaler Inhale 1-2 puffs into the lungs every 4 (four) hours as needed. 08/16/22  Yes [provider]  aspirin EC 81 MG tablet Take 1 tablet (81 mg total) by mouth daily. Swallow whole. 04/09/22  Yes [name], [name], MD  benztropine (COGENTIN) 1 MG tablet Take 1 tablet (1 mg total) by mouth daily. Patient taking differently: Take 1 mg by mouth 2 (two) times daily. 04/08/22  Yes [name], [name], MD  busPIRone (BUSPAR) 15 MG tablet Take 15 mg by mouth 3 (three) times daily. 12/11/22  Yes [provider]  cholecalciferol (VITAMIN D3) 25 MCG (1000 UNIT) tablet Take 1,000 Units by mouth daily.   Yes [provider]  divalproex (DEPAKOTE) 250 MG DR tablet Take 3 tablets (750 mg total) by mouth every 12 (twelve) hours. 04/08/22  Yes [name], [name], MD  fenofibrate 54 MG tablet Take 1 tablet (54 mg total) by mouth daily. 04/08/22  Yes [name], [name], MD  gemfibrozil (LOPID) 600 MG tablet Take 1 tablet (600 mg total) by mouth 2 (two) times daily. 04/08/22    Yes [name], [name], MD  hydrOXYzine (ATARAX) 50 MG tablet Take 1 tablet (50 mg total) by mouth every 4 (four) hours as needed for anxiety. 04/08/22  Yes [name], [name], MD  levothyroxine (SYNTHROID) 88 MCG tablet Take 1 tablet (88 mcg total) by mouth daily at 6 (six) AM. 04/09/22  Yes [name], [name], MD  naproxen sodium (ALEVE) 220 MG tablet Take 220 mg by mouth 2 (two) times daily with a meal.   Yes [provider]  OLANZapine (ZYPREXA) 10 MG tablet Take 10 mg by mouth at bedtime.    Yes [provider]  OLANZapine (ZYPREXA) 20 MG tablet Take 1 tablet (20 mg total) by mouth at bedtime. 04/08/22  Yes [name], [name], MD  polyethylene glycol (MIRALAX / GLYCOLAX) 17 g packet Take 17 g by mouth daily.   Yes [provider]  simvastatin (ZOCOR) 20 MG tablet Take 1 tablet (20 mg total) by mouth at bedtime. 04/08/22  Yes [name], [name], MD  zolpidem (AMBIEN) 5 MG tablet Take 5 mg by mouth at bedtime as needed. 10/29/22  Yes [provider]  furosemide (LASIX) 20 MG tablet Take 20 mg by mouth daily. Patient not taking: Reported on 12/13/2022 05/06/22 05/06/23  [provider]  lisinopril (ZESTRIL) 10 MG tablet Take 1 tablet (10 mg total) by mouth daily. Patient not taking: Reported on 12/13/2022 04/08/22   [name], [name], MD  metFORMIN (GLUCOPHAGE) 1000 MG tablet Take 1 tablet (1,000 mg total) by mouth 2 (two) times daily with a meal. Patient not taking: Reported on 12/13/2022 04/08/22   [name], [name], MD  pioglitazone (ACTOS) 45 MG tablet Take 1 tablet (45 mg total) by mouth daily. Patient not taking: Reported on 12/13/2022 04/09/22   [name], [name], MD  sulfamethoxazole-trimethoprim (BACTRIM DS) 800-160 MG tablet Take 1 tablet by mouth 2 (two) times daily. Patient not taking: Reported on 12/13/2022 12/10/22   [provider]  traZODone (DESYREL) 150 MG tablet Take 1 tablet (150 mg total) by mouth at bedtime. Patient not taking: Reported on 12/13/2022 04/08/22   [name], [name], MD     ALLERGIES:  Allergies  Allergen Reactions   Phenytoin Sodium Extended Other (See Comments) and Nausea And Vomiting   Prednisone Hives and Nausea And Vomiting   Latex Hives, Nausea And Vomiting and Rash     SOCIAL HISTORY:  Social History   Socioeconomic History   Marital status: Single    Spouse name: Not on file   Number of children: Not on file   Years of education: Not on file   Highest education level: Not on file  Occupational History   Not on file   Tobacco Use   Smoking status: Never   Smokeless tobacco: Never  Vaping Use   Vaping Use: Never used  Substance and Sexual Activity   Alcohol use: No   Drug use: No   Sexual activity: Not Currently  Other Topics Concern   Not on file  Social History Narrative   Not on file   Social Determinants of Health   Financial Resource Strain: Not on file  Food Insecurity: Food Insecurity Present (04/09/2022)   Hunger Vital Sign    Worried About Running Out of Food in the Last Year: Often true    Ran Out of Food in the Last Year: Often true  Transportation Needs: Unmet Transportation Needs (04/09/2022)   PRAPARE - Transportation    Lack of Transportation (Medical): Yes      Lack of Transportation (Non-Medical): Yes  Physical Activity: Not on file  Stress: Not on file  Social Connections: Not on file  Intimate Partner Violence: Not At Risk (03/23/2022)   Humiliation, Afraid, Rape, and Kick questionnaire    Fear of Current or Ex-Partner: No    Emotionally Abused: No    Physically Abused: No    Sexually Abused: No     FAMILY HISTORY:  Family History  Problem Relation Age of Onset   Heart disease Father    Heart attack Father       REVIEW OF SYSTEMS:  Review of Systems  Constitutional:  Negative for chills and fever.  Respiratory:  Negative for cough and shortness of breath.   Cardiovascular:  Negative for chest pain and palpitations.  Gastrointestinal:  Negative for abdominal pain, nausea and vomiting.  Genitourinary:  Negative for dysuria and urgency.  Skin:        + LLE Abscess   All other systems reviewed and are negative.   VITAL SIGNS:  Temp:  [97.2 F (36.2 C)-98.7 F (37.1 C)] 98 F (36.7 C) (06/10 0843) Pulse Rate:  [86-95] 92 (06/10 0843) Resp:  [18-20] 18 (06/10 0843) BP: (103-112)/(42-57) 107/52 (06/10 0843) SpO2:  [100 %] 100 % (06/10 0843)     Height: 6' 4" (193 cm) Weight: 96.2 kg BMI (Calculated): 25.82   INTAKE/OUTPUT:  06/09 0701 - 06/10 0700 In: 900  [P.O.:600; IV Piggyback:300] Out: 5100 [Urine:5100]  PHYSICAL EXAM:  Physical Exam Vitals and nursing note reviewed. Exam conducted with a chaperone present.  Constitutional:      General: He is not in acute distress.    Appearance: Normal appearance. He is normal weight. He is not ill-appearing.  HENT:     Head: Normocephalic and atraumatic.  Eyes:     General: No scleral icterus.    Conjunctiva/sclera: Conjunctivae normal.  Cardiovascular:     Rate and Rhythm: Normal rate.     Pulses: Normal pulses.  Pulmonary:     Effort: Pulmonary effort is normal. No respiratory distress.  Genitourinary:    Comments: Deferred Musculoskeletal:     Right lower leg: Edema (Trace) present.     Left lower leg: Edema (Trace) present.  Skin:    General: Skin is warm and dry.     Findings: Erythema and wound present.     Comments: Just superior to the left ankle on the anterior surface of the shin is a 1-2 I&D wound, there is a scant amount of purulent drainage present to this. The surrounding tissue is erythematous and tender. Just superior to this is an area of softness vs fluctuance. With extensive palpation I was not able to express significant drainage. No crepitus. No evidence of necrotizing infection.   Neurological:     General: No focal deficit present.     Mental Status: He is alert and oriented to person, place, and time.  Psychiatric:        Mood and Affect: Mood normal.        Behavior: Behavior normal.      Labs:     Latest Ref Rng & Units 12/20/2022    5:06 AM 12/18/2022    4:59 AM 12/17/2022    5:21 AM  CBC  WBC 4.0 - 10.5 K/uL 7.6  6.4  6.8   Hemoglobin 13.0 - 17.0 g/dL 7.9  8.0  8.3   Hematocrit 39.0 - 52.0 % 23.8  24.8  25.5   Platelets 150 - 400 K/uL   117  101  112       Latest Ref Rng & Units 12/20/2022    5:06 AM 12/19/2022    5:06 AM 12/18/2022    4:59 AM  CMP  Glucose 70 - 99 mg/dL 169  98  99   BUN 6 - 20 mg/dL 21  19  21   Creatinine 0.61 - 1.24 mg/dL 1.00  0.92   1.02   Sodium 135 - 145 mmol/L 136  139  138   Potassium 3.5 - 5.1 mmol/L 4.2  4.4  4.1   Chloride 98 - 111 mmol/L 101  102  102   CO2 22 - 32 mmol/L 28  29  27   Calcium 8.9 - 10.3 mg/dL 8.0  8.1  8.0      Imaging studies:   CT Tib/Fib (12/14/2022) personally reviewed, no undrained fluid collections, no subcutaneous air, soft tissue edema present, and radiologist report reviewed below:  IMPRESSION: 1. Moderate diffuse subcutaneous fat edema and swelling throughout the visualized calf from the knee to the ankle. 2. No walled-off abscess is identified. 3. No acute osseous abnormality. 4. Mild likely degenerative subchondral cystic change within the anterolateral aspect of the tibial plafond.   Assessment/Plan: (ICD-10's: L03.116) 52 y.o. male with LLE cellulitis and abscess s/p I&D by EDP on 06/03, complicated by pertinent comorbidities including T2DM, schizoaffective disorder.   - Appreciate area of concern noted by hospitalist MD just superior to I&D site on left anterior distal shin. On my examination, I was unable to express any significant drainage. Will repeat CT Left Tib/Fib to evaluate for undrained collections, ensure no worsening. Pending results, may need more formal drainage/debridement in the OR.   - Continue NPO for now pending CT  - Continue IV Abx (Ancef); Cx with MSSA  - Wound Care: Will recommend addition of ACE wrap and elevation of LLE to help with edema  - Pain control prn  - Glycemic control; doing well    - Okay to mobilize as feasible  - Further management per primary service; we will follow   All of the above findings and recommendations were discussed with the patient, and all of patient's questions were answered to his expressed satisfaction.  Thank you for the opportunity to participate in this patient's care.   -- Rayquan Amrhein, PA-C [facility] 12/20/2022, 11:12 AM M-F: 7am - 4pm  

## 2022-12-20 NOTE — Interval H&P Note (Signed)
History and Physical Interval Note:  12/20/2022 4:57 PM  Derrick Atkins  has presented today for surgery, with the diagnosis of Left Lower Leg Abscess.  The various methods of treatment have been discussed with the patient and family. After consideration of risks, benefits and other options for treatment, the patient has consented to  Procedure(s): INCISION AND DRAINAGE ABSCESS (Left) anterior ankle as a surgical intervention.  The patient's history has been reviewed, patient examined, no change in status, stable for surgery.  I have reviewed the patient's chart and labs.  Questions were answered to the patient's satisfaction.    Left dorsal foot is marked, just distal to the dressing in place.  Campbell Lerner

## 2022-12-20 NOTE — H&P (View-Only) (Signed)
[facility] SURGICAL CONSULTATION NOTE (initial) - cpt: 96045   HISTORY OF PRESENT ILLNESS (HPI):  Patient with history of schizoaffective disorder, somewhat poor historian, and unable to contribute much this morning. Primary history obtained through chart review and discussion with other members of his care team.   52 y.o. male presented to [facility] ED initially on 06/03 for evaluation of left lower leg pain. This morning, he is unable to tell me how long he has been having pain or swelling to this left lower leg. He reports "he can not remember much." Seems that on presentation he had been having around 1 week of swelling, pain, and redness to his LLE just above the ankle. Does not appear to have had fever, chills,. No reported injuries to this area. He did undergo bedside I&D with EDP. Culture from this ultimately grew MSSA. He is currently on Ancef. Most recent labs revealed a normal WBC at 7.6K, Hgb to 7.9 which is stable, sCr normal at 1.00, no electrolyte derangements. He did have CT Tib/Fib on admission which showed soft tissue edema, no gross abscess, no subcutaneous emphysema.   Surgery is consulted by hospitalist physician Dr. Shonna Chock, MD in this context for evaluation and management of left lower extremity celullitis and abscess.  PAST MEDICAL HISTORY (PMH):  Past Medical History:  Diagnosis Date   Cerebral palsy (HCC)    Depression    Diabetes mellitus without complication (HCC)    Hypertension    Kidney stones    Schizoaffective disorder (HCC)    Scoliosis      PAST SURGICAL HISTORY (PSH):  Past Surgical History:  Procedure Laterality Date   BOWEL RESECTION     CATARACT EXTRACTION W/PHACO Right 09/15/2022   Procedure: CATARACT EXTRACTION PHACO AND INTRAOCULAR LENS PLACEMENT (IOC) RIGHT DIABETIC VISION BLUE HEALON 5  15.79  01:31.9;  Surgeon: Lockie Mola, MD;  Location: [facility];  Service: Ophthalmology;  Laterality: Right;  Latex Diabetic    LAPAROTOMY N/A 05/15/2020   Procedure: EXPLORATORY LAPAROTOMY;  Surgeon: Henrene Dodge, MD;  Location: [facility] ORS;  Service: General;  Laterality: N/A;     MEDICATIONS:  Prior to Admission medications   Medication Sig Start Date End Date Taking? Authorizing Provider  ABILIFY MAINTENA 400 MG PRSY prefilled syringe Inject 400 mg into the muscle every 28 (twenty-eight) days.   Yes [provider]  albuterol (VENTOLIN HFA) 108 (90 Base) MCG/ACT inhaler Inhale 1-2 puffs into the lungs every 4 (four) hours as needed. 08/16/22  Yes [provider]  aspirin EC 81 MG tablet Take 1 tablet (81 mg total) by mouth daily. Swallow whole. 04/09/22  Yes [name], [name], MD  benztropine (COGENTIN) 1 MG tablet Take 1 tablet (1 mg total) by mouth daily. Patient taking differently: Take 1 mg by mouth 2 (two) times daily. 04/08/22  Yes [name], [name], MD  busPIRone (BUSPAR) 15 MG tablet Take 15 mg by mouth 3 (three) times daily. 12/11/22  Yes [provider]  cholecalciferol (VITAMIN D3) 25 MCG (1000 UNIT) tablet Take 1,000 Units by mouth daily.   Yes [provider]  divalproex (DEPAKOTE) 250 MG DR tablet Take 3 tablets (750 mg total) by mouth every 12 (twelve) hours. 04/08/22  Yes [name], [name], MD  fenofibrate 54 MG tablet Take 1 tablet (54 mg total) by mouth daily. 04/08/22  Yes [name], [name], MD  gemfibrozil (LOPID) 600 MG tablet Take 1 tablet (600 mg total) by mouth 2 (two) times daily. 04/08/22  Yes [name], [name], MD  hydrOXYzine (ATARAX) 50 MG tablet Take 1 tablet (50 mg total) by mouth every 4 (four) hours as needed for anxiety. 04/08/22  Yes [name], [name], MD  levothyroxine (SYNTHROID) 88 MCG tablet Take 1 tablet (88 mcg total) by mouth daily at 6 (six) AM. 04/09/22  Yes [name], [name], MD  naproxen sodium (ALEVE) 220 MG tablet Take 220 mg by mouth 2 (two) times daily with a meal.   Yes [provider]  OLANZapine (ZYPREXA) 10 MG tablet Take 10 mg by mouth at bedtime.    Yes [provider]  OLANZapine (ZYPREXA) 20 MG tablet Take 1 tablet (20 mg total) by mouth at bedtime. 04/08/22  Yes [name], [name], MD  polyethylene glycol (MIRALAX / GLYCOLAX) 17 g packet Take 17 g by mouth daily.   Yes [provider]  simvastatin (ZOCOR) 20 MG tablet Take 1 tablet (20 mg total) by mouth at bedtime. 04/08/22  Yes [name], [name], MD  zolpidem (AMBIEN) 5 MG tablet Take 5 mg by mouth at bedtime as needed. 10/29/22  Yes [provider]  furosemide (LASIX) 20 MG tablet Take 20 mg by mouth daily. Patient not taking: Reported on 12/13/2022 05/06/22 05/06/23  [provider]  lisinopril (ZESTRIL) 10 MG tablet Take 1 tablet (10 mg total) by mouth daily. Patient not taking: Reported on 12/13/2022 04/08/22   [name], [name], MD  metFORMIN (GLUCOPHAGE) 1000 MG tablet Take 1 tablet (1,000 mg total) by mouth 2 (two) times daily with a meal. Patient not taking: Reported on 12/13/2022 04/08/22   [name], [name], MD  pioglitazone (ACTOS) 45 MG tablet Take 1 tablet (45 mg total) by mouth daily. Patient not taking: Reported on 12/13/2022 04/09/22   [name], [name], MD  sulfamethoxazole-trimethoprim (BACTRIM DS) 800-160 MG tablet Take 1 tablet by mouth 2 (two) times daily. Patient not taking: Reported on 12/13/2022 12/10/22   [provider]  traZODone (DESYREL) 150 MG tablet Take 1 tablet (150 mg total) by mouth at bedtime. Patient not taking: Reported on 12/13/2022 04/08/22   [name], [name], MD     ALLERGIES:  Allergies  Allergen Reactions   Phenytoin Sodium Extended Other (See Comments) and Nausea And Vomiting   Prednisone Hives and Nausea And Vomiting   Latex Hives, Nausea And Vomiting and Rash     SOCIAL HISTORY:  Social History   Socioeconomic History   Marital status: Single    Spouse name: Not on file   Number of children: Not on file   Years of education: Not on file   Highest education level: Not on file  Occupational History   Not on file   Tobacco Use   Smoking status: Never   Smokeless tobacco: Never  Vaping Use   Vaping Use: Never used  Substance and Sexual Activity   Alcohol use: No   Drug use: No   Sexual activity: Not Currently  Other Topics Concern   Not on file  Social History Narrative   Not on file   Social Determinants of Health   Financial Resource Strain: Not on file  Food Insecurity: Food Insecurity Present (04/09/2022)   Hunger Vital Sign    Worried About Running Out of Food in the Last Year: Often true    Ran Out of Food in the Last Year: Often true  Transportation Needs: Unmet Transportation Needs (04/09/2022)   PRAPARE - Administrator, Civil Service (Medical): Yes  Lack of Transportation (Non-Medical): Yes  Physical Activity: Not on file  Stress: Not on file  Social Connections: Not on file  Intimate Partner Violence: Not At Risk (03/23/2022)   Humiliation, Afraid, Rape, and Kick questionnaire    Fear of Current or Ex-Partner: No    Emotionally Abused: No    Physically Abused: No    Sexually Abused: No     FAMILY HISTORY:  Family History  Problem Relation Age of Onset   Heart disease Father    Heart attack Father       REVIEW OF SYSTEMS:  Review of Systems  Constitutional:  Negative for chills and fever.  Respiratory:  Negative for cough and shortness of breath.   Cardiovascular:  Negative for chest pain and palpitations.  Gastrointestinal:  Negative for abdominal pain, nausea and vomiting.  Genitourinary:  Negative for dysuria and urgency.  Skin:        + LLE Abscess   All other systems reviewed and are negative.   VITAL SIGNS:  Temp:  [97.2 F (36.2 C)-98.7 F (37.1 C)] 98 F (36.7 C) (06/10 0843) Pulse Rate:  [86-95] 92 (06/10 0843) Resp:  [18-20] 18 (06/10 0843) BP: (103-112)/(42-57) 107/52 (06/10 0843) SpO2:  [100 %] 100 % (06/10 0843)     Height: 6\' 4"  (193 cm) Weight: 96.2 kg BMI (Calculated): 25.82   INTAKE/OUTPUT:  06/09 0701 - 06/10 0700 In: 900  [P.O.:600; IV Piggyback:300] Out: 5100 [Urine:5100]  PHYSICAL EXAM:  Physical Exam Vitals and nursing note reviewed. Exam conducted with a chaperone present.  Constitutional:      General: He is not in acute distress.    Appearance: Normal appearance. He is normal weight. He is not ill-appearing.  HENT:     Head: Normocephalic and atraumatic.  Eyes:     General: No scleral icterus.    Conjunctiva/sclera: Conjunctivae normal.  Cardiovascular:     Rate and Rhythm: Normal rate.     Pulses: Normal pulses.  Pulmonary:     Effort: Pulmonary effort is normal. No respiratory distress.  Genitourinary:    Comments: Deferred Musculoskeletal:     Right lower leg: Edema (Trace) present.     Left lower leg: Edema (Trace) present.  Skin:    General: Skin is warm and dry.     Findings: Erythema and wound present.     Comments: Just superior to the left ankle on the anterior surface of the shin is a 1-2 I&D wound, there is a scant amount of purulent drainage present to this. The surrounding tissue is erythematous and tender. Just superior to this is an area of softness vs fluctuance. With extensive palpation I was not able to express significant drainage. No crepitus. No evidence of necrotizing infection.   Neurological:     General: No focal deficit present.     Mental Status: He is alert and oriented to person, place, and time.  Psychiatric:        Mood and Affect: Mood normal.        Behavior: Behavior normal.      Labs:     Latest Ref Rng & Units 12/20/2022    5:06 AM 12/18/2022    4:59 AM 12/17/2022    5:21 AM  CBC  WBC 4.0 - 10.5 K/uL 7.6  6.4  6.8   Hemoglobin 13.0 - 17.0 g/dL 7.9  8.0  8.3   Hematocrit 39.0 - 52.0 % 23.8  24.8  25.5   Platelets 150 - 400 K/uL  117  101  112       Latest Ref Rng & Units 12/20/2022    5:06 AM 12/19/2022    5:06 AM 12/18/2022    4:59 AM  CMP  Glucose 70 - 99 mg/dL 540  98  99   BUN 6 - 20 mg/dL 21  19  21    Creatinine 0.61 - 1.24 mg/dL 9.81  1.91   4.78   Sodium 135 - 145 mmol/L 136  139  138   Potassium 3.5 - 5.1 mmol/L 4.2  4.4  4.1   Chloride 98 - 111 mmol/L 101  102  102   CO2 22 - 32 mmol/L 28  29  27    Calcium 8.9 - 10.3 mg/dL 8.0  8.1  8.0      Imaging studies:   CT Tib/Fib (12/14/2022) personally reviewed, no undrained fluid collections, no subcutaneous air, soft tissue edema present, and radiologist report reviewed below:  IMPRESSION: 1. Moderate diffuse subcutaneous fat edema and swelling throughout the visualized calf from the knee to the ankle. 2. No walled-off abscess is identified. 3. No acute osseous abnormality. 4. Mild likely degenerative subchondral cystic change within the anterolateral aspect of the tibial plafond.   Assessment/Plan: (ICD-10's: L03.116) 52 y.o. male with LLE cellulitis and abscess s/p I&D by EDP on 06/03, complicated by pertinent comorbidities including T2DM, schizoaffective disorder.   - Appreciate area of concern noted by hospitalist MD just superior to I&D site on left anterior distal shin. On my examination, I was unable to express any significant drainage. Will repeat CT Left Tib/Fib to evaluate for undrained collections, ensure no worsening. Pending results, may need more formal drainage/debridement in the OR.   - Continue NPO for now pending CT  - Continue IV Abx (Ancef); Cx with MSSA  - Wound Care: Will recommend addition of ACE wrap and elevation of LLE to help with edema  - Pain control prn  - Glycemic control; doing well    - Okay to mobilize as feasible  - Further management per primary service; we will follow   All of the above findings and recommendations were discussed with the patient, and all of patient's questions were answered to his expressed satisfaction.  Thank you for the opportunity to participate in this patient's care.   -- Lynden Oxford, PA-C [facility] 12/20/2022, 11:12 AM M-F: 7am - 4pm

## 2022-12-20 NOTE — Progress Notes (Signed)
Brief Progress Note  Plan for Incision and Drainage of left lower leg abscess this afternoon/evening with Dr Claudine Mouton pending OR/Anesthesia availability  All risks, benefits, and alternatives to above procedure(s) were discussed via telephone with the patient's legal guardian, Lenn Sink [phone]), all of the patient's legal guardian's questions were answered to their expressed satisfaction, patient's legal guardian expresses wish to proceed, and verbal informed consent was obtained via telephone.  Patient also updated and aware  -- Lynden Oxford, PA-C [facility] 12/20/2022, 3:03 PM M-F: 7am - 4pm

## 2022-12-20 NOTE — Progress Notes (Signed)
Progress Note   Patient: Derrick Atkins:[id number] DOB: [birth year] DOA: 12/13/2022     7 DOS: the patient was seen and examined on 12/20/2022   Brief hospital course: Derrick Atkins is a 52 y.o. Caucasian male with medical history significant for type II diabetes mellitus, hypertension, schizoaffective disorder, cerebral palsy and depression, who presented to the emergency room with acute onset of worsening left lower extremity swelling with erythema and, tenderness and pain.  He stated that it has been going on over the last 6 months but got significantly worse over the last 6 days.  Did not notice any drainage though.  No fever but he experienced chills.  No chest pain or dyspnea or cough.  No nausea or vomiting or abdominal pain.  No dysuria, oliguria or hematuria or flank pain.  He did not experience significant elevation of his blood glucose levels. He is being treated with iv antibiotics after I and D that was done on admission in the ED.  Assessment and Plan:  Abscess with cellulitis 2/2 MSSA LLE. No DVT on PVL. S/p I and D in the ED with report of marked purulence noted.  -s/p vancomycin and Zosyn,  wound Cx growing MSSA, continue Ancef 2g q8h on 6/6  blood cultures were ordered but after start of IV antibiotics has NGTD Ongoing pain, swelling, fluctuance, purulence - will consult gen surg today, think needs OR I and d and washout  AKI resolved Likely prerenal, resolved with fluids    T2DM hb aic of 4.7 6/3 Normoglycemic here - SSI   Hypothyroid - home levothyroxine   Schizophrenia Without behavioral disturbance - cont home zyprexa and buspar and cogentin, is also on monthly abilify IM at home   HTN Here bp wnl - consider restarting  home antihypertensives if bp is elevated at discharge otherwise adjust antibiotics as needed    Anemia of chronic disease hb of 8.0 on 12/18/2022 Iron 42, transferrin saturation of 21%,continue oral iron continue with vitamin C. Folic  acid 7.7 at lower end, continue oral Folic acid  B12 over the limit   Vitamin D deficiency: continue vitamin D 50,000 units p.o. weekly, follow with PCP to repeat vitamin D level after 3 to 6 months.   Guardianship Candice Abran Cantor is listed as patient's legal guardian, with DSS, patient is a ward of the state. Patient resides in a group home. Swentrail Allen-Bird is the Child psychotherapist (also listed in the demographics tab) who works with the patient most closely.   Thrombocytopenia Likely reactive, Platelet count of  above 100,000.  Continue pharmacologic DVT ppx     Subjective: Seen patient at the bedside.No new events today. Ongoing RLE pain     Physical Exam: Vitals:   12/19/22 1614 12/19/22 2203 12/20/22 0526 12/20/22 0843  BP: (!) 103/57 (!) 112/49 (!) 103/42 (!) 107/52  Pulse: 86 92 95 92  Resp: 20 18 19 18   Temp: 97.9 F (36.6 C) (!) 97.2 F (36.2 C) 98.7 F (37.1 C) 98 F (36.7 C)  TempSrc:      SpO2: 100% 100% 100% 100%  Weight:      Height:      physical Exam: General: NAD, lying comfortably Appear in no distress, affect appropriate Eyes: PERRLA ENT: Oral Mucosa Clear, moist  Neck: no JVD,  Cardiovascular: S1 and S2 Present, no Murmur,  Respiratory: good respiratory effort, Bilateral Air entry equal and Decreased, no Crackles, no wheezes Abdomen: Bowel Sound present, Soft and no tenderness,  Skin:  erythema and tenderness and fluctuance and purulent discharge from LLE infection Extremities: swelling LLE Neurologic: without any new focal findings    Data Reviewed:     Family Communication:    Disposition: Status is: Inpatient Remains inpatient appropriate because: needs surgical consult  Planned Discharge Destination:  group home    Time spent: 35 minutes  Author: Silvano Bilis, MD 12/20/2022 10:48 AM  For on call review [email or web address].

## 2022-12-20 NOTE — Anesthesia Postprocedure Evaluation (Signed)
Anesthesia Post Note  Patient: DAVED MCFANN  Procedure(s) Performed: INCISION AND DRAINAGE ABSCESS (Left)  Patient location during evaluation: PACU Anesthesia Type: General Level of consciousness: awake and alert, oriented and patient cooperative Pain management: pain level controlled Vital Signs Assessment: post-procedure vital signs reviewed and stable Respiratory status: spontaneous breathing, nonlabored ventilation and respiratory function stable Cardiovascular status: blood pressure returned to baseline and stable Postop Assessment: adequate PO intake Anesthetic complications: no   No notable events documented.   Last Vitals:  Vitals:   12/20/22 1830 12/20/22 1835  BP: 112/64   Pulse: 85 84  Resp: 17 17  Temp:    SpO2: 100% 100%    Last Pain:  Vitals:   12/20/22 1830  TempSrc:   PainSc: 0-No pain                 Reed Breech

## 2022-12-21 ENCOUNTER — Encounter: Payer: Self-pay | Admitting: Surgery

## 2022-12-21 DIAGNOSIS — Z794 Long term (current) use of insulin: Secondary | ICD-10-CM

## 2022-12-21 DIAGNOSIS — E039 Hypothyroidism, unspecified: Secondary | ICD-10-CM

## 2022-12-21 DIAGNOSIS — F200 Paranoid schizophrenia: Secondary | ICD-10-CM

## 2022-12-21 LAB — GLUCOSE, CAPILLARY
Glucose-Capillary: 131 mg/dL — ABNORMAL HIGH (ref 70–99)
Glucose-Capillary: 111 mg/dL — ABNORMAL HIGH (ref 70–99)
Glucose-Capillary: 149 mg/dL — ABNORMAL HIGH (ref 70–99)
Glucose-Capillary: 105 mg/dL — ABNORMAL HIGH (ref 70–99)

## 2022-12-21 LAB — CBC
HCT: 23.6 % — ABNORMAL LOW (ref 39.0–52.0)
Hemoglobin: 7.6 g/dL — ABNORMAL LOW (ref 13.0–17.0)
MCH: 28 pg (ref 26.0–34.0)
MCHC: 32.2 g/dL (ref 30.0–36.0)
MCV: 87.1 fL (ref 80.0–100.0)
Platelets: 134 10*3/uL — ABNORMAL LOW (ref 150–400)
RBC: 2.71 MIL/uL — ABNORMAL LOW (ref 4.22–5.81)
RDW: 14.5 % (ref 11.5–15.5)
WBC: 9.8 10*3/uL (ref 4.0–10.5)
nRBC: 0 % (ref 0.0–0.2)

## 2022-12-21 LAB — BASIC METABOLIC PANEL
Anion gap: 10 (ref 5–15)
BUN: 19 mg/dL (ref 6–20)
CO2: 28 mmol/L (ref 22–32)
Calcium: 8 mg/dL — ABNORMAL LOW (ref 8.9–10.3)
Chloride: 100 mmol/L (ref 98–111)
Creatinine, Ser: 0.97 mg/dL (ref 0.61–1.24)
GFR, Estimated: >60 mL/min (ref >60)
Glucose, Bld: 196 mg/dL — ABNORMAL HIGH (ref 70–99)
Potassium: 4.2 mmol/L (ref 3.5–5.1)
Sodium: 138 mmol/L (ref 135–145)

## 2022-12-21 MED ORDER — SODIUM CHLORIDE 0.9 % IV SOLN: INTRAVENOUS | Status: DC | PRN

## 2022-12-21 NOTE — Plan of Care (Signed)
The patient ambulates with assistance to the bathroom. Call bell at reach. Problem: Education: Goal: Ability to describe self-care measures that may prevent or decrease complications (Diabetes Survival Skills Education) will improve Outcome: Progressing Goal: Individualized Educational Video(s) Outcome: Progressing   Problem: Coping: Goal: Ability to adjust to condition or change in health will improve Outcome: Progressing   Problem: Fluid Volume: Goal: Ability to maintain a balanced intake and output will improve Outcome: Progressing   Problem: Health Behavior/Discharge Planning: Goal: Ability to identify and utilize available resources and services will improve Outcome: Progressing Goal: Ability to manage health-related needs will improve Outcome: Progressing   Problem: Metabolic: Goal: Ability to maintain appropriate glucose levels will improve Outcome: Progressing   Problem: Nutritional: Goal: Maintenance of adequate nutrition will improve Outcome: Progressing Goal: Progress toward achieving an optimal weight will improve Outcome: Progressing   Problem: Skin Integrity: Goal: Risk for impaired skin integrity will decrease Outcome: Progressing

## 2022-12-21 NOTE — Progress Notes (Signed)
  Progress Note   Patient: Derrick Atkins ZOX:[id number] DOB: [birth year] DOA: 12/13/2022     8 DOS: the patient was seen and examined on 12/21/2022   Brief hospital course: DEARIS DANIS is a 52 y.o. Caucasian male with medical history significant for type II diabetes mellitus, hypertension, schizoaffective disorder, cerebral palsy and depression, presented for evaluation of acute onset of worsening left lower extremity swelling with erythema and, tenderness and pain.  He is being treated with iv antibiotics after I and D that was done on admission in the ED. Later surgery team evaluated him performed incision drainage of anterior dorsal left leg.  Assessment and Plan: * Cellulitis and abscess of left leg S/p I &D by general surgery team Continue with IV ancef regimen for MSSA wound culture growth. Pain control.  Ace wrap dressing, foot elevated per surgery team.  AKI (acute kidney injury) - resolved with IV hydration. Avoid nephrotoxic drugs.  Type 2 diabetes mellitus without complications (HCC) Continue supplement coverage with NovoLog. We will hold off oral hypoglycemics.  Dyslipidemia On statin therapy.  Hypothyroidism continue Synthroid.  Schizophrenia, paranoid, chronic (HCC) on Zyprexa and Cogentin. Monthly Abilify injections as outpatient.  Essential hypertension Hold ACE inhibitor therapy due to AKI. Continue as needed IV labetalol.        Subjective: Patient is seen and examined today morning, He has left leg soreness. Advised to work with PT, out of bed.  Physical Exam: Vitals:   12/20/22 1859 12/21/22 0536 12/21/22 0800 12/21/22 1616  BP: 134/86 122/64 (!) 117/57 (!) 110/59  Pulse: 87 96 (!) 101 85  Resp: 16 18 18 16   Temp: 98.5 F (36.9 C) 98.2 F (36.8 C) 98.1 F (36.7 C) 97.9 F (36.6 C)  TempSrc: Oral Oral  Oral  SpO2: 100% 100% 100% 100%  Weight:      Height:       General -  Middle aged Caucasian male, no apparent distress HEENT -  PERRLA, EOMI, atraumatic head, non tender sinuses. Lung - Clear, rales, rhonchi, wheezes. Heart - S1, S2 heard, no murmurs, rubs, trace pedal edema Neuro - Alert, awake and oriented , non focal exam. Abdomen soft, non tender.  Skin - Warm and dry. Data Reviewed:  Blood sugars, CBC, BMP  Family Communication: Patient lives alone, able to take his own decision.  Disposition: Status is: Inpatient Remains inpatient appropriate because: IV antibiotics awaiting cultures, path.  Planned Discharge Destination: Home with Home Health    Time spent: 46 minutes  Author: Marcelino Duster, MD 12/21/2022 4:56 PM  For on call review [email or web address].

## 2022-12-21 NOTE — Progress Notes (Signed)
[facility] SURGICAL PROGRESS NOTE  Hospital Day(s): 8.   Post op day(s): 1 Day Post-Op.   Interval History:  Patient seen and examined No acute events or new complaints overnight.  Patient reports he is doing well; thankful for his care LLE still sore expectedly No fever, chills Remains without leukocytosis; 9.8K Hgb to 7.6; stable Renal function normal; sCr - 0.97; UO - 1600 ccs On carb modified diet On Ancef   Vital signs in last 24 hours: [min-max] current  Temp:  [97.2 F (36.2 C)-100.2 F (37.9 C)] 98.2 F (36.8 C) (06/11 0536) Pulse Rate:  [67-96] 96 (06/11 0536) Resp:  [16-30] 18 (06/11 0536) BP: (95-134)/(45-86) 122/64 (06/11 0536) SpO2:  [99 %-100 %] 100 % (06/11 0536) Weight:  [96.2 kg] 96.2 kg (06/10 1541)     Height: 6\' 4"  (193 cm) Weight: 96.2 kg BMI (Calculated): 25.82   Intake/Output last 2 shifts:  06/10 0701 - 06/11 0700 In: 1100 [P.O.:600; I.V.:500] Out: 1600 [Urine:1600]   Physical Exam:  Constitutional: alert, cooperative and no distress  Respiratory: breathing non-labored at rest  Cardiovascular: regular rate and sinus rhythm  Integumentary: Incision x2 to anterior LLE; foley in place acting as penrose drain, serosanguinous drainage, wound re-wrapped   Labs:     Latest Ref Rng & Units 12/21/2022    5:33 AM 12/20/2022    5:06 AM 12/18/2022    4:59 AM  CBC  WBC 4.0 - 10.5 K/uL 9.8  7.6  6.4   Hemoglobin 13.0 - 17.0 g/dL 7.6  7.9  8.0   Hematocrit 39.0 - 52.0 % 23.6  23.8  24.8   Platelets 150 - 400 K/uL 134  117  101       Latest Ref Rng & Units 12/21/2022    5:33 AM 12/20/2022    5:06 AM 12/19/2022    5:06 AM  CMP  Glucose 70 - 99 mg/dL 960  454  98   BUN 6 - 20 mg/dL 19  21  19    Creatinine 0.61 - 1.24 mg/dL 0.98  1.19  1.47   Sodium 135 - 145 mmol/L 138  136  139   Potassium 3.5 - 5.1 mmol/L 4.2  4.2  4.4   Chloride 98 - 111 mmol/L 100  101  102   CO2 22 - 32 mmol/L 28  28  29    Calcium 8.9 - 10.3 mg/dL 8.0  8.0  8.1      Imaging studies: No new pertinent imaging studies   Assessment/Plan:  52 y.o. male 1 Day Post-Op s/p excision and debridement of complex distal left lower extremity abscess/liquefactive necrosis    - Okay to continue IV Abx  - Continue wound drain (foley acting as penrose given latex allergy); he will go with this  - Wound Care: Continue ACE wrap and elevation of LLE to help with edema             - Pain control prn             - Glycemic control; doing well               - Okay to mobilize as feasible             - Further management per primary service   - Discharge Planning: Okay for DC in next 24 hours from surgery perspective; wound care as above. Will follow up in ~10 days for drain removal  All of the above findings and recommendations  were discussed with the patient, and the medical team, and all of patient's questions were answered to his expressed satisfaction.  -- Lynden Oxford, PA-C [facility] 12/21/2022, 7:56 AM M-F: 7am - 4pm

## 2022-12-22 LAB — BASIC METABOLIC PANEL
Anion gap: 7 (ref 5–15)
BUN: 24 mg/dL — ABNORMAL HIGH (ref 6–20)
CO2: 28 mmol/L (ref 22–32)
Calcium: 8.1 mg/dL — ABNORMAL LOW (ref 8.9–10.3)
Chloride: 100 mmol/L (ref 98–111)
Creatinine, Ser: 0.86 mg/dL (ref 0.61–1.24)
GFR, Estimated: >60 mL/min (ref >60)
Glucose, Bld: 182 mg/dL — ABNORMAL HIGH (ref 70–99)
Potassium: 4.1 mmol/L (ref 3.5–5.1)
Sodium: 135 mmol/L (ref 135–145)

## 2022-12-22 LAB — GLUCOSE, CAPILLARY
Glucose-Capillary: 114 mg/dL — ABNORMAL HIGH (ref 70–99)
Glucose-Capillary: 86 mg/dL (ref 70–99)
Glucose-Capillary: 117 mg/dL — ABNORMAL HIGH (ref 70–99)
Glucose-Capillary: 107 mg/dL — ABNORMAL HIGH (ref 70–99)

## 2022-12-22 LAB — CBC
HCT: 23.7 % — ABNORMAL LOW (ref 39.0–52.0)
Hemoglobin: 7.5 g/dL — ABNORMAL LOW (ref 13.0–17.0)
MCH: 28 pg (ref 26.0–34.0)
MCHC: 31.6 g/dL (ref 30.0–36.0)
MCV: 88.4 fL (ref 80.0–100.0)
Platelets: 179 10*3/uL (ref 150–400)
RBC: 2.68 MIL/uL — ABNORMAL LOW (ref 4.22–5.81)
RDW: 14.6 % (ref 11.5–15.5)
WBC: 9.7 10*3/uL (ref 4.0–10.5)
nRBC: 0 % (ref 0.0–0.2)

## 2022-12-22 MED ORDER — CEPHALEXIN 500 MG PO CAPS
500.0000 mg | ORAL_CAPSULE | Freq: Four times a day (QID) | ORAL | Status: DC
  Administered 2022-12-23 (×2): 500 mg via ORAL
  Filled 2022-12-22 (×2): qty 1

## 2022-12-22 NOTE — Progress Notes (Signed)
  Progress Note   Patient: Derrick Atkins ZOX:[id number] DOB: 24-Apr-1971 DOA: 12/13/2022     9 DOS: the patient was seen and examined on 12/22/2022   Brief hospital course: Derrick Atkins is a 52 y.o. Caucasian male with medical history significant for type II diabetes mellitus, hypertension, schizoaffective disorder, cerebral palsy and depression, presented for evaluation of acute onset of worsening left lower extremity swelling with erythema and, tenderness and pain.  He is being treated with iv antibiotics after I and D that was done on admission in the ED. Later surgery team evaluated him performed incision drainage of anterior dorsal left leg. Cultures grew MSSA. Antibiotics to be transitioned to oral Keflex.  Assessment and Plan: * Cellulitis and abscess of left leg S/p I &D by general surgery team  IV ancef regimen to be changed to Keflex for MSSA wound culture growth. Pain control.  Ace wrap dressing, foot elevated per surgery team.  AKI (acute kidney injury) - resolved with IV hydration. Avoid nephrotoxic drugs.  Type 2 diabetes mellitus without complications (HCC) Continue supplement coverage with NovoLog. We will hold off oral hypoglycemics.  Dyslipidemia On statin therapy.  Chronic anemia- Hb stable, no active bleeding.  Hypothyroidism continue Synthroid.  Schizophrenia, paranoid, chronic (HCC) on Zyprexa and Cogentin. Monthly Abilify injections as outpatient.  Essential hypertension Hold ACE inhibitor therapy due to AKI. Continue as needed IV labetalol.        Subjective: Patient is seen and examined today morning, He has left leg soreness. Advised to work with PT, out of bed.  Physical Exam: Vitals:   12/21/22 1616 12/21/22 2009 12/22/22 0408 12/22/22 0715  BP: (!) 110/59 (!) 106/50 (!) 114/46 102/60  Pulse: 85 91 95 86  Resp: 16 19 18 16   Temp: 97.9 F (36.6 C) 98.5 F (36.9 C) 98.3 F (36.8 C) 97.9 F (36.6 C)  TempSrc: Oral Oral  Oral  SpO2:  100% 100% 100% 96%  Weight:      Height:       General -  Middle aged Caucasian male, no apparent distress HEENT - PERRLA, EOMI, atraumatic head, non tender sinuses. Lung - Clear, rales, rhonchi, wheezes. Heart - S1, S2 heard, no murmurs, rubs, trace pedal edema Neuro - Alert, awake and oriented , non focal exam. Abdomen soft, non tender.  Skin - Warm and dry. Data Reviewed:  Blood sugars, CBC, BMP  Family Communication: Patient lives alone, able to take his own decision.  Disposition: Status is: Inpatient Remains inpatient appropriate because:  awaiting path, placement  Planned Discharge Destination: awaiting acceptance by Assisted living    Time spent: 42 minutes  Author: Marcelino Duster, MD 12/22/2022 5:29 PM  For on call review [email or web address].

## 2022-12-22 NOTE — Progress Notes (Signed)
Mobility Specialist - Progress Note   12/22/22 1055  Mobility  Activity Ambulated with assistance in hallway  Level of Assistance Standby assist, set-up cues, supervision of patient - no hands on  Assistive Device Front wheel walker  Distance Ambulated (ft) 180 ft  Activity Response Tolerated well  Mobility Referral Yes  $Mobility charge 1 Mobility  Mobility Specialist Start Time (ACUTE ONLY) 0947  Mobility Specialist Stop Time (ACUTE ONLY) 1001  Mobility Specialist Time Calculation (min) (ACUTE ONLY) 14 min   Pt sitting in recliner on RA upon arrival. Pt STS with ModA and ambulates in hallway SBA with no LOB noted. Pt returns to recliner with needs in reach, chair alarm activated, and RN student in room.   Terrilyn Saver  Mobility Specialist  12/22/22 10:57 AM

## 2022-12-23 LAB — BASIC METABOLIC PANEL
Anion gap: 6 (ref 5–15)
BUN: 23 mg/dL — ABNORMAL HIGH (ref 6–20)
CO2: 28 mmol/L (ref 22–32)
Calcium: 8.4 mg/dL — ABNORMAL LOW (ref 8.9–10.3)
Chloride: 102 mmol/L (ref 98–111)
Creatinine, Ser: 0.87 mg/dL (ref 0.61–1.24)
GFR, Estimated: >60 mL/min (ref >60)
Glucose, Bld: 132 mg/dL — ABNORMAL HIGH (ref 70–99)
Potassium: 4.4 mmol/L (ref 3.5–5.1)
Sodium: 136 mmol/L (ref 135–145)

## 2022-12-23 LAB — CBC
HCT: 23.7 % — ABNORMAL LOW (ref 39.0–52.0)
Hemoglobin: 7.6 g/dL — ABNORMAL LOW (ref 13.0–17.0)
MCH: 28.4 pg (ref 26.0–34.0)
MCHC: 32.1 g/dL (ref 30.0–36.0)
MCV: 88.4 fL (ref 80.0–100.0)
Platelets: 254 10*3/uL (ref 150–400)
RBC: 2.68 MIL/uL — ABNORMAL LOW (ref 4.22–5.81)
RDW: 14.8 % (ref 11.5–15.5)
WBC: 9.7 10*3/uL (ref 4.0–10.5)
nRBC: 0 % (ref 0.0–0.2)

## 2022-12-23 LAB — GLUCOSE, CAPILLARY
Glucose-Capillary: 81 mg/dL (ref 70–99)
Glucose-Capillary: 154 mg/dL — ABNORMAL HIGH (ref 70–99)

## 2022-12-23 MED ORDER — ASCORBIC ACID 500 MG PO TABS: 500.0000 mg | ORAL_TABLET | Freq: Every day | ORAL | 3 refills | Status: DC

## 2022-12-23 MED ORDER — POLYSACCHARIDE IRON COMPLEX 150 MG PO CAPS: 150.0000 mg | ORAL_CAPSULE | Freq: Every day | ORAL | 3 refills | Status: DC

## 2022-12-23 MED ORDER — FOLIC ACID 1 MG PO TABS: 1.0000 mg | ORAL_TABLET | Freq: Every day | ORAL | 3 refills | Status: DC

## 2022-12-23 MED ORDER — CEPHALEXIN 500 MG PO CAPS: 500.0000 mg | ORAL_CAPSULE | Freq: Four times a day (QID) | ORAL | 0 refills | Status: AC

## 2022-12-23 NOTE — Plan of Care (Signed)

## 2022-12-23 NOTE — Progress Notes (Signed)
Mobility Specialist - Progress Note   12/23/22 1500  Mobility  Activity Ambulated with assistance in room;Ambulated with assistance to bathroom  Level of Assistance Standby assist, set-up cues, supervision of patient - no hands on  Assistive Device Front wheel walker  Distance Ambulated (ft) 10 ft  Activity Response Tolerated well  $Mobility charge 1 Mobility     Pt in bathroom upon arrival, utilizing RA. Pt had large BM, able to perform self peri-care but assist needed for more thorough hygiene care. Pt ambulated to EOB, no complaints, with RW. Pt left in EOB with alarm set, needs in reach. RN and NT notified.    Filiberto Pinks Mobility Specialist 12/23/22, 3:25 PM

## 2022-12-23 NOTE — Care Management Important Message (Signed)
Important Message  Patient Details  Name: Derrick Atkins MRN: [id number] Date of Birth: 12/27/1970   Medicare Important Message Given:  Yes  Reviewed Medicare IM with Lenn Sink, DSS legal guardian, at [phone].  Copy of Medicare IM faxed to Candice's attention at [phone].   Johnell Comings 12/23/2022, 2:51 PM

## 2022-12-23 NOTE — Progress Notes (Signed)
Report given to [facility], AVS and dressing change explained. Dressing supplies provided, awaiting for his ride.

## 2022-12-23 NOTE — NC FL2 (Signed)
MEDICAID FL2 LEVEL OF CARE FORM     IDENTIFICATION  Patient Name: Derrick Atkins Birthdate: 05-10-1971 Sex: male Admission Date (Current Location): 12/13/2022  [facility] and [place] Number:  Chiropodist and Address:         Provider Number: [id number]  Attending Physician Name and Address:  Marcelino Duster, MD  Relative Name and Phone Number:       Current Level of Care: Hospital Recommended Level of Care: Other (Comment) (group home) Prior Approval Number:    Date Approved/Denied:   PASRR Number:    Discharge Plan: Other (Comment) (group home)    Current Diagnoses: Patient Active Problem List   Diagnosis Date Noted   Cellulitis and abscess of left leg 12/13/2022   AKI (acute kidney injury) (HCC) 12/13/2022   Dyslipidemia 12/13/2022   Schizophrenia (HCC) 03/23/2022   Aggressive behavior    Schizophrenia, paranoid, chronic (HCC)    Long QT interval 04/16/2014   Hypothyroidism 12/25/2013   Essential hypertension 05/03/2007   Type 2 diabetes mellitus without complications (HCC) 05/03/2007    Orientation RESPIRATION BLADDER Height & Weight     Self, Time, Situation, Place  Normal Continent Weight: 96.2 kg Height:  6\' 4"  (193 cm)  BEHAVIORAL SYMPTOMS/MOOD NEUROLOGICAL BOWEL NUTRITION STATUS      Incontinent Diet (low sodium heart healthy)  AMBULATORY STATUS COMMUNICATION OF NEEDS Skin   Supervision Verbally Surgical wounds, Other (Comment) (Drain.  see dc summary for wound care instructions)                       Personal Care Assistance Level of Assistance              Functional Limitations Info             SPECIAL CARE FACTORS FREQUENCY                       Contractures Contractures Info: Not present    Additional Factors Info  Code Status, Allergies Code Status Info: Full Allergies Info: Phenytoin Sodium Extended, Prednisone, Latex           Medication List       STOP taking these medications      furosemide 20 MG tablet Commonly known as: LASIX    lisinopril 10 MG tablet Commonly known as: ZESTRIL    metFORMIN 1000 MG tablet Commonly known as: GLUCOPHAGE    naproxen sodium 220 MG tablet Commonly known as: ALEVE    pioglitazone 45 MG tablet Commonly known as: ACTOS    traZODone 150 MG tablet Commonly known as: DESYREL    zolpidem 5 MG tablet Commonly known as: AMBIEN           TAKE these medications     Abilify Maintena 400 MG Prsy prefilled syringe Generic drug: ARIPiprazole ER Inject 400 mg into the muscle every 28 (twenty-eight) days.    albuterol 108 (90 Base) MCG/ACT inhaler Commonly known as: VENTOLIN HFA Inhale 1-2 puffs into the lungs every 4 (four) hours as needed.    ascorbic acid 500 MG tablet Commonly known as: VITAMIN C Take 1 tablet (500 mg total) by mouth daily. Start taking on: December 24, 2022    aspirin EC 81 MG tablet Take 1 tablet (81 mg total) by mouth daily. Swallow whole.    benztropine 1 MG tablet Commonly known as: COGENTIN Take 1 tablet (1 mg total) by mouth daily. What changed: when to  take this    busPIRone 15 MG tablet Commonly known as: BUSPAR Take 15 mg by mouth 3 (three) times daily.    cephALEXin 500 MG capsule Commonly known as: KEFLEX Take 1 capsule (500 mg total) by mouth every 6 (six) hours for 4 days.    cholecalciferol 25 MCG (1000 UNIT) tablet Commonly known as: VITAMIN D3 Take 1,000 Units by mouth daily.    divalproex 250 MG DR tablet Commonly known as: DEPAKOTE Take 3 tablets (750 mg total) by mouth every 12 (twelve) hours.    fenofibrate 54 MG tablet Take 1 tablet (54 mg total) by mouth daily.    folic acid 1 MG tablet Commonly known as: FOLVITE Take 1 tablet (1 mg total) by mouth daily. Start taking on: December 24, 2022    gemfibrozil 600 MG tablet Commonly known as: LOPID Take 1 tablet (600 mg total) by mouth 2 (two) times daily.    hydrOXYzine 50 MG tablet Commonly known as: ATARAX Take 1  tablet (50 mg total) by mouth every 4 (four) hours as needed for anxiety.    iron polysaccharides 150 MG capsule Commonly known as: NIFEREX Take 1 capsule (150 mg total) by mouth daily. Start taking on: December 24, 2022    levothyroxine 88 MCG tablet Commonly known as: SYNTHROID Take 1 tablet (88 mcg total) by mouth daily at 6 (six) AM.    OLANZapine 10 MG tablet Commonly known as: ZYPREXA Take 10 mg by mouth at bedtime.    OLANZapine 20 MG tablet Commonly known as: ZYPREXA Take 1 tablet (20 mg total) by mouth at bedtime.    polyethylene glycol 17 g packet Commonly known as: MIRALAX / GLYCOLAX Take 17 g by mouth daily.    simvastatin 20 MG tablet Commonly known as: ZOCOR Take 1 tablet (20 mg total) by mouth at bedtime.    sulfamethoxazole-trimethoprim 800-160 MG tablet Commonly known as: BACTRIM DS Take 1 tablet by mouth 2 (two) times daily.                       Relevant Imaging Results:  Relevant Lab Results:   Additional Information SS #: [id number]  Chapman Fitch, RN

## 2022-12-23 NOTE — Discharge Summary (Signed)
Physician Discharge Summary   Patient: Derrick Atkins MRN: [id number] DOB: 1971-05-22  Admit date:     12/13/2022  Discharge date: 12/23/22  Discharge Physician: Marcelino Duster   PCP: Gracelyn Nurse, MD   Recommendations at discharge:    PCP follow up in 1 week Complete antibiotic regimen. Surgery clinic follow up a scheduled for wound drain removal. Wound care as instructed.  Discharge Diagnoses: Principal Problem:   Cellulitis and abscess of left leg Active Problems:   Type 2 diabetes mellitus without complications (HCC)   AKI (acute kidney injury) (HCC)   Essential hypertension   Schizophrenia, paranoid, chronic (HCC)   Hypothyroidism   Dyslipidemia  Resolved Problems:   * No resolved hospital problems. *  Hospital Course: Derrick Atkins is a 52 y.o. Caucasian male with medical history significant for type II diabetes mellitus, hypertension, schizoaffective disorder, cerebral palsy and depression, presented for evaluation of acute onset of worsening left lower extremity swelling with erythema and, tenderness and pain.  He is being treated with iv antibiotics after I and D that was done on admission in the ED.   During hospital stay, patient was continued on Vancomycin and Zosyn later changed to Ancef therapy for MSSA wound cultures. Surgery team evaluated him performed  excision and debridement of complex distal left lower extremity abscess/liquefactive necrosis. Cultures sent from ED grew MSSA.  Mild drop in hemoglobin noted but is stable since last 3 days.  Antibiotics transitioned to oral Keflex therapy to complete 14 days.  He is hemodynamically stable to be discharged back to group home.  Prescriptions printed out.  Wound care orders given for [facility].  Advised PCP follow-up in surgery clinic follow-up as suggested.  He understands and agrees with the discharge plan.       Consultants: Surgery Procedures performed: 12/20/22 s/p excision and debridement of complex  distal left lower extremity abscess/liquefactive necrosis   Disposition: Group home Diet recommendation:  Discharge Diet Orders (From admission, onward)     Start     Ordered   12/23/22 0000  Diet - low sodium heart healthy        12/23/22 1336           Cardiac diet DISCHARGE MEDICATION: Allergies as of 12/23/2022       Reactions   Phenytoin Sodium Extended Other (See Comments), Nausea And Vomiting   Prednisone Hives, Nausea And Vomiting   Latex Hives, Nausea And Vomiting, Rash        Medication List     STOP taking these medications    furosemide 20 MG tablet Commonly known as: LASIX   lisinopril 10 MG tablet Commonly known as: ZESTRIL   metFORMIN 1000 MG tablet Commonly known as: GLUCOPHAGE   naproxen sodium 220 MG tablet Commonly known as: ALEVE   pioglitazone 45 MG tablet Commonly known as: ACTOS   traZODone 150 MG tablet Commonly known as: DESYREL   zolpidem 5 MG tablet Commonly known as: AMBIEN       TAKE these medications    Abilify Maintena 400 MG Prsy prefilled syringe Generic drug: ARIPiprazole ER Inject 400 mg into the muscle every 28 (twenty-eight) days.   albuterol 108 (90 Base) MCG/ACT inhaler Commonly known as: VENTOLIN HFA Inhale 1-2 puffs into the lungs every 4 (four) hours as needed.   ascorbic acid 500 MG tablet Commonly known as: VITAMIN C Take 1 tablet (500 mg total) by mouth daily. Start taking on: December 24, 2022   aspirin EC 81  MG tablet Take 1 tablet (81 mg total) by mouth daily. Swallow whole.   benztropine 1 MG tablet Commonly known as: COGENTIN Take 1 tablet (1 mg total) by mouth daily. What changed: when to take this   busPIRone 15 MG tablet Commonly known as: BUSPAR Take 15 mg by mouth 3 (three) times daily.   cephALEXin 500 MG capsule Commonly known as: KEFLEX Take 1 capsule (500 mg total) by mouth every 6 (six) hours for 4 days.   cholecalciferol 25 MCG (1000 UNIT) tablet Commonly known as: VITAMIN  D3 Take 1,000 Units by mouth daily.   divalproex 250 MG DR tablet Commonly known as: DEPAKOTE Take 3 tablets (750 mg total) by mouth every 12 (twelve) hours.   fenofibrate 54 MG tablet Take 1 tablet (54 mg total) by mouth daily.   folic acid 1 MG tablet Commonly known as: FOLVITE Take 1 tablet (1 mg total) by mouth daily. Start taking on: December 24, 2022   gemfibrozil 600 MG tablet Commonly known as: LOPID Take 1 tablet (600 mg total) by mouth 2 (two) times daily.   hydrOXYzine 50 MG tablet Commonly known as: ATARAX Take 1 tablet (50 mg total) by mouth every 4 (four) hours as needed for anxiety.   iron polysaccharides 150 MG capsule Commonly known as: NIFEREX Take 1 capsule (150 mg total) by mouth daily. Start taking on: December 24, 2022   levothyroxine 88 MCG tablet Commonly known as: SYNTHROID Take 1 tablet (88 mcg total) by mouth daily at 6 (six) AM.   OLANZapine 10 MG tablet Commonly known as: ZYPREXA Take 10 mg by mouth at bedtime.   OLANZapine 20 MG tablet Commonly known as: ZYPREXA Take 1 tablet (20 mg total) by mouth at bedtime.   polyethylene glycol 17 g packet Commonly known as: MIRALAX / GLYCOLAX Take 17 g by mouth daily.   simvastatin 20 MG tablet Commonly known as: ZOCOR Take 1 tablet (20 mg total) by mouth at bedtime.   sulfamethoxazole-trimethoprim 800-160 MG tablet Commonly known as: BACTRIM DS Take 1 tablet by mouth 2 (two) times daily.               Discharge Care Instructions  (From admission, onward)           Start     Ordered   12/23/22 0000  Discharge wound care:       Comments: Apply aquacel Ag around drains, and replace as needed.  Cover with abd/wrap with Ace/Kerlix daily and as needed. Keep left leg elevated.   12/23/22 1336            Follow-up Information     Donovan Kail, PA-C. Schedule an appointment as soon as possible for a visit.   Specialty: Physician Assistant Why: s/p I&D of left leg abscess, has  drain Contact information: [address] [place] [address] [phone]                Discharge Exam: Derrick Atkins Weights   12/13/22 1356 12/20/22 1541  Weight: 96.2 kg 96.2 kg   General -  Middle aged Caucasian male, no apparent distress HEENT - PERRLA, EOMI, atraumatic head, non tender sinuses. Lung - Clear, rales, rhonchi, wheezes. Heart - S1, S2 heard, no murmurs, rubs, trace pedal edema Neuro - Alert, awake and oriented , non focal exam. Abdomen soft, non tender.  Skin - Warm and dry. Left leg wound dressing noted with ACE wrap.  Condition at discharge: stable  The results of  significant diagnostics from this hospitalization (including imaging, microbiology, ancillary and laboratory) are listed below for reference.   Imaging Studies: CT TIBIA FIBULA LEFT W CONTRAST  Result Date: 12/21/2022 CLINICAL DATA:  Soft tissue infection suspected, lower leg, xray done EXAM: CT OF THE LOWER LEFT EXTREMITY WITH CONTRAST TECHNIQUE: Multidetector CT imaging of the lower left extremity was performed according to the standard protocol following intravenous contrast administration. RADIATION DOSE REDUCTION: This exam was performed according to the departmental dose-optimization program which includes automated exposure control, adjustment of the mA and/or kV according to patient size and/or use of iterative reconstruction technique. CONTRAST:  OMNIPAQUE IOHEXOL 300 MG/ML  SOLN COMPARISON:  12/16/2022 CT FINDINGS: Bones/Joint/Cartilage No acute fracture. No dislocation. No erosion or periosteal elevation. Similar degenerative changes at the knee, ankle, and hindfoot. No significant effusion of the knee, ankle, or subtalar joints. Ligaments Suboptimally assessed by CT. Muscles and Tendons No acute muscular abnormality by CT. No intramuscular fluid collections are evident. Focal tenosynovitis of the tibialis anterior tendon just above the level of the ankle joint (series 5, image  737). Remaining tendinous structures are otherwise within normal limits. Soft tissues Diffuse subcutaneous edema throughout the lower leg is more pronounced at the anterior aspect of the lower leg where there is a developing fluid collection with partial rim enhancement measuring approximately 8.0 x 1.1 x 5.3 cm (series 5, image 657; series 10, image 76). There appears to be overlying skin wound or ulceration. No additional fluid collections. No deep fascial fluid. No soft tissue gas. IMPRESSION: 1. Diffuse subcutaneous edema throughout the lower leg is more pronounced at the anterior aspect of the lower leg where there is a developing fluid collection with partial rim enhancement measuring approximately 8.0 x 1.1 x 5.3 cm. There appears to be overlying skin wound or ulceration. Findings are most compatible with phlegmon/developing abscess. 2. Focal tenosynovitis of the tibialis anterior tendon just above the level of the ankle joint. 3. No CT evidence of osteomyelitis or septic arthritis. Electronically Signed   By: Duanne Guess D.O.   On: 12/21/2022 09:33   CT TIBIA FIBULA LEFT W CONTRAST  Result Date: 12/17/2022 CLINICAL DATA:  Soft tissue infection suspected. Evaluate for abscess. EXAM: CT OF THE LOWER LEFT EXTREMITY WITH CONTRAST TECHNIQUE: Multidetector CT imaging of the lower left extremity was performed according to the standard protocol following intravenous contrast administration. RADIATION DOSE REDUCTION: This exam was performed according to the departmental dose-optimization program which includes automated exposure control, adjustment of the mA and/or kV according to patient size and/or use of iterative reconstruction technique. CONTRAST:  OMNIPAQUE IOHEXOL 300 MG/ML  SOLN COMPARISON:  Left tibia and fibula radiographs 12/13/2022 FINDINGS: Bones/Joint/Cartilage O the cortices are intact. No acute fracture. Tiny plantar and posterior calcaneal heel spurs. There is mild likely degenerative  subchondral cystic change within the anterolateral aspect of the tibial plafond (coronal series 6, image 78). Ligaments Suboptimally assessed by CT. Muscles and Tendons Normal size and density of the regional musculature. No gross tendon tear is seen. Soft tissues There is moderate diffuse subcutaneous fat edema and swelling throughout the visualized calf from the knee to the ankle. Within the distal posterior calf there is some coalescing a fluid at the deep aspect of the subcutaneous fat, measuring up to 9 mm in AP dimension (sagittal series 9, image 44 and axial series 4, image 158). Within the distal anterior calf there is similar mild coalescing of fluid measuring up to 6 mm in AP dimension (  sagittal series 9, image 41 and axial series 4, image 618). No walled-off abscess is identified. No knee joint effusion. IMPRESSION: 1. Moderate diffuse subcutaneous fat edema and swelling throughout the visualized calf from the knee to the ankle. 2. No walled-off abscess is identified. 3. No acute osseous abnormality. 4. Mild likely degenerative subchondral cystic change within the anterolateral aspect of the tibial plafond. Electronically Signed   By: Neita Garnet M.D.   On: 12/17/2022 08:45   US Venous Img Lower Unilateral Left  Result Date: 12/13/2022 CLINICAL DATA:  Left lower extremity pain, redness, and swelling EXAM: LEFT LOWER EXTREMITY VENOUS DOPPLER ULTRASOUND TECHNIQUE: Gray-scale sonography with compression, as well as color and duplex ultrasound, were performed to evaluate the deep venous system(s) from the level of the common femoral vein through the popliteal and proximal calf veins. COMPARISON:  None Available. FINDINGS: VENOUS Normal compressibility of the common femoral, superficial femoral, and popliteal veins, as well as the visualized calf veins. Visualized portions of profunda femoral vein and great saphenous vein unremarkable. No filling defects to suggest DVT on grayscale or color Doppler  imaging. Doppler waveforms show normal direction of venous flow, normal respiratory plasticity and response to augmentation. Limited views of the contralateral common femoral vein are unremarkable. OTHER Subcutaneous soft tissue swelling overlying the popliteal region and calf. Limitations: none IMPRESSION: Negative. Electronically Signed   By: Agustin Cree M.D.   On: 12/13/2022 16:38   DG Tibia/Fibula Left  Result Date: 12/13/2022 CLINICAL DATA:  Pain EXAM: LEFT TIBIA AND FIBULA - 2 VIEW COMPARISON:  None Available. FINDINGS: No fracture or dislocation is seen. There are no focal lytic lesions. There is no effusion in the left knee. There is edema in subcutaneous plane. IMPRESSION: No radiographic abnormality is seen in bony structures in left lower leg. There is diffuse edema in subcutaneous plane. Electronically Signed   By: Ernie Avena M.D.   On: 12/13/2022 15:06    Microbiology: Results for orders placed or performed during the hospital encounter of 12/13/22  Culture, blood (Routine X 2) w Reflex to ID Panel     Status: None   Collection Time: 12/14/22 10:50 AM   Specimen: BLOOD  Result Value Ref Range Status   Specimen Description BLOOD LEFT ANTECUBITAL  Final   Special Requests   Final    BOTTLES DRAWN AEROBIC AND ANAEROBIC Blood Culture results may not be optimal due to an inadequate volume of blood received in culture bottles   Culture   Final    NO GROWTH 5 DAYS Performed at [facility], [address], [place], [place] [address]    Report Status 12/19/2022 FINAL  Final  Aerobic Culture w Gram Stain (superficial specimen)     Status: None   Collection Time: 12/14/22 10:50 AM   Specimen: Abscess  Result Value Ref Range Status   Specimen Description   Final    ABSCESS Performed at [facility], [address] [place], [place] [address]    Special Requests   Final    LEFT ANKLE ABSCESS Performed at [facility], [address]  [place], [place] [address]    Gram Stain   Final    FEW WBC PRESENT, PREDOMINANTLY PMN RARE GRAM POSITIVE COCCI IN PAIRS Performed at [facility], [address], [place], [place] [address]    Culture ABUNDANT STAPHYLOCOCCUS AUREUS  Final   Report Status 12/16/2022 FINAL  Final   Organism ID, Bacteria STAPHYLOCOCCUS AUREUS  Final  Susceptibility   Staphylococcus aureus - MIC*    CIPROFLOXACIN <=0.5 SENSITIVE Sensitive     ERYTHROMYCIN <=0.25 SENSITIVE Sensitive     GENTAMICIN <=0.5 SENSITIVE Sensitive     OXACILLIN 0.5 SENSITIVE Sensitive     TETRACYCLINE <=1 SENSITIVE Sensitive     VANCOMYCIN <=0.5 SENSITIVE Sensitive     TRIMETH/SULFA <=10 SENSITIVE Sensitive     CLINDAMYCIN <=0.25 SENSITIVE Sensitive     RIFAMPIN <=0.5 SENSITIVE Sensitive     Inducible Clindamycin NEGATIVE Sensitive     LINEZOLID 2 SENSITIVE Sensitive     * ABUNDANT STAPHYLOCOCCUS AUREUS  Culture, blood (Routine X 2) w Reflex to ID Panel     Status: None   Collection Time: 12/14/22 11:30 AM   Specimen: BLOOD  Result Value Ref Range Status   Specimen Description BLOOD RIGHT FA  Final   Special Requests   Final    BOTTLES DRAWN AEROBIC AND ANAEROBIC Blood Culture adequate volume   Culture   Final    NO GROWTH 5 DAYS Performed at [facility], [address] [place], [place] [address]    Report Status 12/19/2022 FINAL  Final    Labs: CBC: Recent Labs  Lab 12/18/22 0459 12/20/22 0506 12/21/22 0533 12/22/22 0628 12/23/22 0455  WBC 6.4 7.6 9.8 9.7 9.7  HGB 8.0* 7.9* 7.6* 7.5* 7.6*  HCT 24.8* 23.8* 23.6* 23.7* 23.7*  MCV 86.4 85.6 87.1 88.4 88.4  PLT 101* 117* 134* 179 254   Basic Metabolic Panel: Recent Labs  Lab 12/19/22 0506 12/20/22 0506 12/21/22 0533 12/22/22 0628 12/23/22 0455  NA 139 136 138 135 136  K 4.4 4.2 4.2 4.1 4.4  CL 102 101 100 100 102  CO2 29 28 28 28 28   GLUCOSE 98 169* 196* 182* 132*  BUN 19 21* 19 24* 23*  CREATININE 0.92 1.00 0.97 0.86 0.87  CALCIUM  8.1* 8.0* 8.0* 8.1* 8.4*   Liver Function Tests: No results for input(s): "AST", "ALT", "ALKPHOS", "BILITOT", "PROT", "ALBUMIN" in the last 168 hours. CBG: Recent Labs  Lab 12/22/22 1142 12/22/22 1704 12/22/22 2108 12/23/22 0747 12/23/22 1154  GLUCAP 86 117* 107* 81 154*    Discharge time spent: greater than 30 minutes.  Signed: Marcelino Duster, MD [facility] 12/23/2022

## 2022-12-23 NOTE — Progress Notes (Signed)
    Durable Medical Equipment  (From admission, onward)           Start     Ordered   12/23/22 1423  For home use only DME Hospital bed  Once       Question Answer Comment  Length of Need Lifetime   Patient has (list medical condition): , leg swelling   The above medical condition requires: Patient requires the ability to reposition frequently   Head must be elevated greater than: 30 degrees   Bed type Semi-electric   Support Surface: Gel Overlay      12/23/22 1429

## 2022-12-23 NOTE — TOC Transition Note (Addendum)
Transition of Care [facility]) - CM/SW Discharge Note   Patient Details  Name: Derrick Atkins MRN: [id number] Date of Birth: [birth year]  Transition of [facility] CM/SW Contact:  Chapman Fitch, RN Phone Number: 12/23/2022, 2:46 PM   Clinical Narrative:     Patient to discharge today Voicemail left for Candice and Sentrell with DSS  Brandi at [facility] group [facility] confirms that patient can return today Merry Proud states they do not have a preference of agency.  Referral made to Cindi with [facility] with start of care 12/28/22 Bedside RN to send patient with 4 days of dressing supplies.   Merry Proud confirms she will change the dressing and or train staff at the group home to change on days that home health doesn't come out  Merry Proud states that DSS has requested a hospital bed.  Referral sent to Jon with [employer]  Brandi to arrange transport, and to call TOC back with details Fl2 snd DC summary secure emailed to [place].  Provided bedside RN number to call report   400 pm - Brandi at group home has arranged transport Received return call from [place] at DSS.  She is in agreement to plan     Barriers to Discharge: Continued Medical Work up   Patient Goals and CMS Choice      Discharge Placement                         Discharge Plan and Services Additional resources added to the After Visit Summary for       Post Acute Care Choice: NA                               Social Determinants of Health (SDOH) Interventions SDOH Screenings   Food Insecurity: Food Insecurity Present (04/09/2022)  Housing: Low Risk  (03/23/2022)  Transportation Needs: Unmet Transportation Needs (04/09/2022)  Utilities: Not At Risk (03/23/2022)  Alcohol Screen: Low Risk  (03/23/2022)  Tobacco Use: Low Risk  (12/21/2022)     Readmission Risk Interventions     No data to display

## 2022-12-23 NOTE — Plan of Care (Signed)
  Problem: Health Behavior/Discharge Planning: Goal: Ability to manage health-related needs will improve Outcome: Progressing   Problem: Clinical Measurements: Goal: Will remain free from infection Outcome: Progressing   Problem: Clinical Measurements: Goal: Respiratory complications will improve Outcome: Progressing   Problem: Clinical Measurements: Goal: Cardiovascular complication will be avoided Outcome: Progressing   Problem: Activity: Goal: Risk for activity intolerance will decrease Outcome: Progressing   Problem: Pain Managment: Goal: General experience of comfort will improve Outcome: Progressing   

## 2022-12-24 NOTE — Progress Notes (Addendum)
Patient require gel overlay for hospital bed due to limited mobility and bowel incontinence

## 2022-12-28 ENCOUNTER — Ambulatory Visit (INDEPENDENT_AMBULATORY_CARE_PROVIDER_SITE_OTHER): Payer: 59 | Admitting: Physician Assistant

## 2022-12-28 ENCOUNTER — Encounter: Payer: Self-pay | Admitting: Physician Assistant

## 2022-12-28 VITALS — BP 115/78 | HR 98 | Temp 98.1°F | Ht 75.0 in | Wt 214.0 lb

## 2022-12-28 DIAGNOSIS — L03116 Cellulitis of left lower limb: Secondary | ICD-10-CM

## 2022-12-28 DIAGNOSIS — Z09 Encounter for follow-up examination after completed treatment for conditions other than malignant neoplasm: Secondary | ICD-10-CM

## 2022-12-28 DIAGNOSIS — L02416 Cutaneous abscess of left lower limb: Secondary | ICD-10-CM

## 2022-12-28 NOTE — Patient Instructions (Signed)
If you have any concerns or questions, please feel free to call our office.   Surgical Wound Debridement, Care After The following information offers guidance on how to care for yourself after your procedure. Your health care provider may also give you more specific instructions. If you have problems or questions, contact your health care provider. What can I expect after the procedure? After the procedure, it is common to have: Pain or soreness. Fluid that leaks from your wound. Stiffness. A larger wound. This is because the dead tissue has been removed. Follow these instructions at home: Medicines Take over-the-counter and prescription medicines only as told by your health care provider. If you were prescribed an antibiotic medicine, take it or apply it as told by your health care provider. Do not stop using the antibiotic even if you start to feel better. Wound care Follow instructions from your health care provider about how to take care of your wound. Make sure you: Wash your hands with soap and water for at least 20 seconds before and after you change your bandage (dressing). If soap and water are not available, use hand sanitizer. Change your dressing as told by your health care provider. If your dressing is dry and stuck when you try to remove it, moisten or wet the dressing with a saline solution, which is made of salt and water, or water so that it can be removed without harming your skin or wound tissue. Check your wound for signs of infection every time your dressing is changed. Have someone help you do this if you are not able. Watch for: More redness, swelling, or pain. More fluid or blood. Warmth. Pus. A bad smell coming from your wound even after you clean it. Do not take baths, swim, or use a hot tub until your health care provider approves. Ask your health care provider if you may take showers. You may only be allowed to take sponge baths. Activity Ask your health care  provider if the medicine prescribed to you requires you to avoid driving or using machinery. Return to your normal activities as told by your health care provider. Ask your health care provider what activities are safe for you. General instructions     Eat a healthy diet with lots of protein. Sources of protein include meats, cheese, nuts, beans, and protein supplement drinks. Ask your health care provider to suggest the best diet for you. Do not use any products that contain nicotine or tobacco. These products include cigarettes, chewing tobacco, and vaping devices, such as e-cigarettes. These can delay healing after surgery. If you need help quitting, ask your health care provider. Keep all follow-up visits. This is important. Contact a health care provider if: You have a fever. Your pain medicine is not helping. Your wound is more red and swollen. Your wound bed declines: areas that were red and pink develop new dead (necrotic) tissue (black, grey, yellow). You have increased bleeding. You have pus coming from your wound. You have a bad smell coming from your wound. Your wound is not getting better after 1-2 weeks of treatment. You develop a new medical condition, such as diabetes, peripheral vascular disease, or conditions that affect your body's defence system (immune system). Get help right away if: You develop a fever associated with increasing pain and redness around your wound. Summary After the procedure, it is common to have pain, soreness, stiffness, or fluid leaking from your wound. Follow instructions from your health care provider about how to  take care of your wound. Check your wound for signs of infection every time your dressing is changed. Eat a healthy diet with lots of protein. Ask your health care provider to suggest the best diet for you. Contact a health care provider if you have fever, more swelling and redness, increased bleeding, or a bad smell coming from your  wound. This information is not intended to replace advice given to you by your health care provider. Make sure you discuss any questions you have with your health care provider. Document Revised: 06/27/2020 Document Reviewed: 06/27/2020 Elsevier Patient Education  2024 [employer].

## 2022-12-28 NOTE — Progress Notes (Signed)
[facility] POST-OP OFFICE VISIT  12/28/2022  HPI: Derrick Atkins is a 52 y.o. male 8 days s/p incision and drainage with excisional debridement of complex distal LLE abscess  Overall doing well - difficult history given baseline psychiatric history Pain to LLE improved Does report balance issues No fever, chills Dressing changed by group home staff Completing ABx  Vital signs: BP 115/78   Pulse 98   Temp 98.1 F (36.7 C)   Ht 6\' 3"  (1.905 m)   Wt 214 lb (97.1 kg)   SpO2 98%   BMI 26.75 kg/m    Physical Exam: Constitutional: Well appearing male, NAD Skin: Two I&D sites to the distal anterior LLE, foley in place acting as penrose drain (removed), edema resolving, no erythema   Assessment/Plan: This is a 52 y.o. male 8 days s/p incision and drainage with excisional debridement of complex distal LLE abscess   - Drain removed  - Continue local wound care with dry gauze, Kerlix, and ACE wrap  - Complete Abx  - Will attempt to arrange PT either ambulatory or HH given balance issue  - Will follow up in 3-4 weeks for wound check   -- Lynden Oxford, PA-C [facility] 12/28/2022, 2:28 PM M-F: 7am - 4pm

## 2023-01-05 ENCOUNTER — Emergency Department: Payer: 59

## 2023-01-05 ENCOUNTER — Emergency Department
Admission: EM | Admit: 2023-01-05 | Discharge: 2023-01-05 | Disposition: A | Discharge status: Home / Self Care | Payer: 59 | Attending: Emergency Medicine | Admitting: Emergency Medicine

## 2023-01-05 ENCOUNTER — Other Ambulatory Visit: Payer: Self-pay

## 2023-01-05 DIAGNOSIS — R410 Disorientation, unspecified: Secondary | ICD-10-CM | POA: Diagnosis not present

## 2023-01-05 DIAGNOSIS — L03116 Cellulitis of left lower limb: Secondary | ICD-10-CM | POA: Diagnosis not present

## 2023-01-05 DIAGNOSIS — E11649 Type 2 diabetes mellitus with hypoglycemia without coma: Secondary | ICD-10-CM | POA: Insufficient documentation

## 2023-01-05 DIAGNOSIS — E162 Hypoglycemia, unspecified: Secondary | ICD-10-CM

## 2023-01-05 LAB — COMPREHENSIVE METABOLIC PANEL
ALT: 14 U/L (ref 0–44)
AST: 41 U/L (ref 15–41)
Albumin: 3.1 g/dL — ABNORMAL LOW (ref 3.5–5.0)
Alkaline Phosphatase: 57 U/L (ref 38–126)
Anion gap: 9 (ref 5–15)
BUN: 23 mg/dL — ABNORMAL HIGH (ref 6–20)
CO2: 19 mmol/L — ABNORMAL LOW (ref 22–32)
Calcium: 8.5 mg/dL — ABNORMAL LOW (ref 8.9–10.3)
Chloride: 110 mmol/L (ref 98–111)
Creatinine, Ser: 1.85 mg/dL — ABNORMAL HIGH (ref 0.61–1.24)
GFR, Estimated: 44 mL/min — ABNORMAL LOW (ref >60)
Glucose, Bld: 99 mg/dL (ref 70–99)
Potassium: 3.8 mmol/L (ref 3.5–5.1)
Sodium: 138 mmol/L (ref 135–145)
Total Bilirubin: 1.2 mg/dL (ref 0.3–1.2)
Total Protein: 6.4 g/dL — ABNORMAL LOW (ref 6.5–8.1)

## 2023-01-05 LAB — LACTIC ACID, PLASMA
Lactic Acid, Venous: 2.7 mmol/L (ref 0.5–1.9)
Lactic Acid, Venous: 1.7 mmol/L (ref 0.5–1.9)

## 2023-01-05 LAB — CBG MONITORING, ED: Glucose-Capillary: 100 mg/dL — ABNORMAL HIGH (ref 70–99)

## 2023-01-05 MED ORDER — SULFAMETHOXAZOLE-TRIMETHOPRIM 800-160 MG PO TABS: 1.0000 | ORAL_TABLET | Freq: Two times a day (BID) | ORAL | 0 refills | Status: DC

## 2023-01-05 MED ORDER — SULFAMETHOXAZOLE-TRIMETHOPRIM 800-160 MG PO TABS
1.0000 | ORAL_TABLET | Freq: Once | ORAL | Status: AC
  Administered 2023-01-05: 1 via ORAL
  Filled 2023-01-05: qty 1

## 2023-01-05 MED ORDER — SODIUM CHLORIDE 0.9 % IV BOLUS
1000.0000 mL | Freq: Once | INTRAVENOUS | Status: AC
  Administered 2023-01-05: 1000 mL via INTRAVENOUS

## 2023-01-05 NOTE — ED Provider Triage Note (Signed)
Emergency Medicine Provider Triage Evaluation Note  EUGINE BUBB , a 52 y.o. male  was evaluated in triage.  Pt sent for hypoglycemia. Patient unable to communicate symptoms-- lives at [facility]. Recent discharge from hospital after I&D of wound to left lower extremity.   Physical Exam  There were no vitals taken for this visit. Gen:   Awake, no distress  Pale Resp:  Normal effort  MSK:   Dressing to LLE clean and dry   Medical Decision Making  Medically screening exam initiated at 5:36 PM.  Appropriate orders placed.  [name] was informed that the remainder of the evaluation will be completed by another provider, this initial triage assessment does not replace that evaluation, and the importance of remaining in the ED until their evaluation is complete.  Patient seems altered, however I am unsure if this is his baseline. Glucose is 100 in triage. CT head ordered in addition to labs.   Chinita Pester, FNP 01/05/23 2315

## 2023-01-05 NOTE — ED Notes (Addendum)
No returned phone calls from facility. Followed up with facility and they were able to contact the owner Derrick Atkins who stated she would try a taxi. I informed her there are no taxis at this hour of the night. She is to call the owner back and ask for update.

## 2023-01-05 NOTE — ED Notes (Signed)
IV access attempted x2

## 2023-01-05 NOTE — ED Notes (Signed)
Per staff at facility a driver was found by the name of Penni Bombard and will be arriving to transport the patient. Patient was provided box lunch and soda while waiting.

## 2023-01-05 NOTE — ED Notes (Signed)
Patient released to Brita Romp transportation to group home.

## 2023-01-05 NOTE — ED Notes (Signed)
Spoke with [employer] in regards to the patient needing transportation back to [facility] and she provided a number to the facility. This nurse called the facility and they do not have the ability to transport the patient at this time and requested for [facility] to keep him until the morning. This nurse explained that he is ready for dc and we are unable to keep him at this time. The staff member stated they will make some phone calls to attempt to find transport and will call back regarding the same. Candice was updated on the same and stated she will reach out to the on call social worker for transportation options as well. Awaiting to hear back from them both at this time. Patient in chair at the bedside and updated on dc progress.

## 2023-01-05 NOTE — ED Triage Notes (Signed)
Pt to ED [facility] from group home for 2 episodes of hypoglycemia. Cbg 100. Pt seems confused regarding situation. Also reports he is having problems with left foot from previous hospital stay.   DSS Latoya notified of pt arrival.

## 2023-01-05 NOTE — ED Provider Notes (Signed)
[facility] Provider Note   Event Date/Time   First MD Initiated Contact with Patient 01/05/23 1831     (approximate) History  Hypoglycemia  HPI Derrick Atkins is a 52 y.o. male who presents for 2 episodes of hypoglycemia just prior to arrival.  Patient was found to have sugar in the 64 range prior to getting juice and calling EMS.  Patient arrives with glucose of 100 on fingerstick with no new complaints.  Patient states he is having chronic pain overlying the left anterior shin where he was treated for cellulitis recently.  Patient states that this pain has been somewhat worsening after finishing his course of antibiotics 3 days prior to arrival. ROS: Patient currently denies any vision changes, tinnitus, difficulty speaking, facial droop, sore throat, chest pain, shortness of breath, abdominal pain, nausea/vomiting/diarrhea, dysuria, or weakness/numbness/paresthesias in any extremity   Physical Exam  Triage Vital Signs: ED Triage Vitals  Enc Vitals Group     BP 01/05/23 1738 137/66     Pulse Rate 01/05/23 1738 97     Resp 01/05/23 1738 18     Temp 01/05/23 1738 97.9 F (36.6 C)     Temp src --      SpO2 01/05/23 1738 95 %     Weight 01/05/23 1740 213 lb 13.5 oz (97 kg)     Height 01/05/23 1740 6\' 3"  (1.905 m)     Head Circumference --      Peak Flow --      Pain Score 01/05/23 1740 5     Pain Loc --      Pain Edu? --      Excl. in GC? --    Most recent vital signs: Vitals:   01/05/23 1840 01/05/23 1930  BP:  (!) 140/76  Pulse: 81 73  Resp: 15 19  Temp:    SpO2: 100% 100%   General: Awake, oriented x4. CV:  Good peripheral perfusion.  Resp:  Normal effort.  Abd:  No distention.  Other:  Middle-aged overweight Caucasian male laying in bed in no acute distress.  The left distal anterior shin shows 2 areas of of ulceration with surrounding erythema ED Results / Procedures / Treatments  Labs (all labs ordered are listed, but only abnormal  results are displayed) Labs Reviewed  COMPREHENSIVE METABOLIC PANEL - Abnormal; Notable for the following components:      Result Value   CO2 19 (*)    BUN 23 (*)    Creatinine, Ser 1.85 (*)    Calcium 8.5 (*)    Total Protein 6.4 (*)    Albumin 3.1 (*)    GFR, Estimated 44 (*)    All other components within normal limits  LACTIC ACID, PLASMA - Abnormal; Notable for the following components:   Lactic Acid, Venous 2.7 (*)    All other components within normal limits  CBG MONITORING, ED - Abnormal; Notable for the following components:   Glucose-Capillary 100 (*)    All other components within normal limits  LACTIC ACID, PLASMA  CBC WITH DIFFERENTIAL/PLATELET  CBC WITH DIFFERENTIAL/PLATELET  CBC WITH DIFFERENTIAL/PLATELET  RADIOLOGY ED MD interpretation: CT of the head without contrast interpreted by me shows no evidence of acute abnormalities including no intracerebral hemorrhage, obvious masses, or significant edema  X-ray of the tibia and fibula on the left does not show any evidence of acute abnormalities. 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 Patient unable to assist with positioning. Best possible films obtained. EXAM: LEFT TIBIA AND FIBULA - 2 VIEW COMPARISON:  CT left tibia fibula with contrast 12/20/2022 FINDINGS: There is no evidence of fracture or other focal bone lesions. No findings suspicious for acute osteomyelitis or other primary pathologic process. As seen previously there is diffuse edema in the foreleg. On the third image, there is a faint lucency in the anterior soft tissues in the distal foreleg which probably corresponds to the draining subcutaneous abscess noted on CT, measuring 2.3 cm in height. Another faint subcutaneous lucency 3-5 cm above this level is also noted and could indicate a second abscess pocket, measuring 2.1 cm  height. The more inferior collection did appear to extend up to this level on the CT. No other focal abnormality is evident.  No foreign body is seen. There is mild degenerative arthrosis of the medial femorotibial joint and the tibiotalar joint. IMPRESSION: 1. No findings suspicious for acute osteomyelitis or other primary pathologic process. 2. Diffuse edema in the foreleg. 3. Faint lucencies in the anterior soft tissues in the distal foreleg, probably corresponding to the draining subcutaneous abscess noted on CT. 4. 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 Electronically Signed   By: Almira Bar M.D.   On: 01/05/2023 20:10   CT Head Wo Contrast  Result Date: 01/05/2023 CLINICAL DATA:  Hypoglycemia, confusion EXAM: CT HEAD WITHOUT CONTRAST TECHNIQUE: Contiguous axial images were obtained from the base of the skull through the vertex without intravenous contrast. RADIATION DOSE REDUCTION: This exam was performed according to the departmental dose-optimization program which includes automated exposure control, adjustment of the mA and/or kV according to patient size and/or use of iterative reconstruction technique. COMPARISON:  01/18/2022 FINDINGS: Brain: No evidence of acute infarction, hemorrhage, mass, mass effect, or midline shift. No hydrocephalus or extra-axial fluid collection. Somewhat advanced cerebral atrophy for age. Vascular: No hyperdense vessel. Atherosclerotic calcifications in the intracranial carotid and vertebral arteries. Skull: Negative for fracture or focal lesion. Sinuses/Orbits: Mucosal thickening in the ethmoid air cells. Status post right lens replacement. Other: The mastoid air cells are well aerated. IMPRESSION: No acute intracranial process. Electronically Signed   By: Wiliam Ke M.D.   On: 01/05/2023 18:15   PROCEDURES: Critical Care performed: No .1-3 Lead EKG Interpretation  Performed by: Merwyn Katos, MD Authorized by: Merwyn Katos, MD      Interpretation: normal     ECG rate:  71   ECG rate assessment: normal     Rhythm: sinus rhythm     Ectopy: none     Conduction: normal    MEDICATIONS ORDERED IN ED: Medications  sodium chloride 0.9 % bolus 1,000 mL (0 mLs Intravenous Stopped 01/05/23 2139)  sulfamethoxazole-trimethoprim (BACTRIM DS) 800-160 MG per tablet 1 tablet (1 tablet Oral Given 01/05/23 2138)   IMPRESSION / MDM / ASSESSMENT AND PLAN / ED COURSE  I reviewed the triage vital signs and the nursing notes.                             The patient is on the cardiac monitor to evaluate for evidence of arrhythmia and/or significant heart rate changes. Patient's presentation is most consistent with acute presentation with potential threat to life or bodily function. 52 year old male diabetic presents BIBA for hypoglycemia  Given History, Exam, and Workup I have a low suspicion for sepsis induced hypoglycemia,  long lasting medication overdose, suicidal ideation/purposeful overdose attempt.  Reassessment: Patient maintained adequate glucose throughout emergency department course.  Patient is concerned about possible reinfection of this left shin and was given empiric antibiotics prior to follow-up with his primary care physician for further evaluation and management of this chronic wound. After observation in the emergency department for several hours, the Blood sugar has remained stable, and based on the history is expected that the Blood sugar should remain stable after discharge. Disposition: Plan discharge home with close primary care follow-up   FINAL CLINICAL IMPRESSION(S) / ED DIAGNOSES   Final diagnoses:  Hypoglycemia  Cellulitis of left ankle   Rx / DC Orders   ED Discharge Orders          Ordered    sulfamethoxazole-trimethoprim (BACTRIM DS) 800-160 MG tablet  2 times daily        01/05/23 2132           Note:  This document was prepared using Dragon voice recognition software and may include  unintentional dictation errors.   Merwyn Katos, MD 01/06/23 [phone]

## 2023-01-06 ENCOUNTER — Observation Stay
Admission: EM | Admit: 2023-01-06 | Discharge: 2023-01-07 | Payer: 59 | Attending: Internal Medicine | Admitting: Internal Medicine

## 2023-01-06 ENCOUNTER — Other Ambulatory Visit: Payer: Self-pay

## 2023-01-06 ENCOUNTER — Emergency Department: Payer: 59

## 2023-01-06 ENCOUNTER — Observation Stay: Payer: 59

## 2023-01-06 ENCOUNTER — Encounter: Payer: Self-pay | Admitting: Emergency Medicine

## 2023-01-06 DIAGNOSIS — Z91040 Latex allergy status: Secondary | ICD-10-CM | POA: Insufficient documentation

## 2023-01-06 DIAGNOSIS — E785 Hyperlipidemia, unspecified: Secondary | ICD-10-CM

## 2023-01-06 DIAGNOSIS — R112 Nausea with vomiting, unspecified: Secondary | ICD-10-CM | POA: Insufficient documentation

## 2023-01-06 DIAGNOSIS — E039 Hypothyroidism, unspecified: Secondary | ICD-10-CM | POA: Insufficient documentation

## 2023-01-06 DIAGNOSIS — R600 Localized edema: Secondary | ICD-10-CM | POA: Insufficient documentation

## 2023-01-06 DIAGNOSIS — D509 Iron deficiency anemia, unspecified: Secondary | ICD-10-CM | POA: Diagnosis not present

## 2023-01-06 DIAGNOSIS — F200 Paranoid schizophrenia: Secondary | ICD-10-CM

## 2023-01-06 DIAGNOSIS — L02416 Cutaneous abscess of left lower limb: Secondary | ICD-10-CM | POA: Diagnosis present

## 2023-01-06 DIAGNOSIS — Z7982 Long term (current) use of aspirin: Secondary | ICD-10-CM | POA: Insufficient documentation

## 2023-01-06 DIAGNOSIS — N179 Acute kidney failure, unspecified: Secondary | ICD-10-CM | POA: Diagnosis not present

## 2023-01-06 DIAGNOSIS — I10 Essential (primary) hypertension: Secondary | ICD-10-CM | POA: Insufficient documentation

## 2023-01-06 DIAGNOSIS — L03116 Cellulitis of left lower limb: Secondary | ICD-10-CM | POA: Diagnosis not present

## 2023-01-06 DIAGNOSIS — E119 Type 2 diabetes mellitus without complications: Secondary | ICD-10-CM | POA: Insufficient documentation

## 2023-01-06 DIAGNOSIS — Z79899 Other long term (current) drug therapy: Secondary | ICD-10-CM | POA: Diagnosis not present

## 2023-01-06 DIAGNOSIS — M79605 Pain in left leg: Secondary | ICD-10-CM | POA: Diagnosis present

## 2023-01-06 LAB — URINALYSIS, ROUTINE W REFLEX MICROSCOPIC
Bilirubin Urine: NEGATIVE
Glucose, UA: NEGATIVE mg/dL
Hgb urine dipstick: NEGATIVE
Ketones, ur: NEGATIVE mg/dL
Leukocytes,Ua: NEGATIVE
Nitrite: NEGATIVE
Protein, ur: NEGATIVE mg/dL
Specific Gravity, Urine: 1.017 (ref 1.005–1.030)
pH: 5 (ref 5.0–8.0)

## 2023-01-06 LAB — CBC WITH DIFFERENTIAL/PLATELET
Abs Immature Granulocytes: 0.01 10*3/uL (ref 0.00–0.07)
Basophils Absolute: 0 10*3/uL (ref 0.0–0.1)
Basophils Relative: 1 %
Eosinophils Absolute: 0.2 10*3/uL (ref 0.0–0.5)
Eosinophils Relative: 7 %
HCT: 27.5 % — ABNORMAL LOW (ref 39.0–52.0)
Hemoglobin: 8.9 g/dL — ABNORMAL LOW (ref 13.0–17.0)
Immature Granulocytes: 0 %
Lymphocytes Relative: 33 %
Lymphs Abs: 1 10*3/uL (ref 0.7–4.0)
MCH: 28.6 pg (ref 26.0–34.0)
MCHC: 32.4 g/dL (ref 30.0–36.0)
MCV: 88.4 fL (ref 80.0–100.0)
Monocytes Absolute: 0.3 10*3/uL (ref 0.1–1.0)
Monocytes Relative: 10 %
Neutro Abs: 1.5 10*3/uL — ABNORMAL LOW (ref 1.7–7.7)
Neutrophils Relative %: 49 %
Platelets: 105 10*3/uL — ABNORMAL LOW (ref 150–400)
RBC: 3.11 MIL/uL — ABNORMAL LOW (ref 4.22–5.81)
RDW: 15.7 % — ABNORMAL HIGH (ref 11.5–15.5)
WBC: 3.1 10*3/uL — ABNORMAL LOW (ref 4.0–10.5)
nRBC: 0 % (ref 0.0–0.2)

## 2023-01-06 LAB — APTT: aPTT: 40 seconds — ABNORMAL HIGH (ref 24–36)

## 2023-01-06 LAB — COMPREHENSIVE METABOLIC PANEL
ALT: 17 U/L (ref 0–44)
AST: 48 U/L — ABNORMAL HIGH (ref 15–41)
Albumin: 3.1 g/dL — ABNORMAL LOW (ref 3.5–5.0)
Alkaline Phosphatase: 60 U/L (ref 38–126)
Anion gap: 8 (ref 5–15)
BUN: 20 mg/dL (ref 6–20)
CO2: 18 mmol/L — ABNORMAL LOW (ref 22–32)
Calcium: 8.7 mg/dL — ABNORMAL LOW (ref 8.9–10.3)
Chloride: 109 mmol/L (ref 98–111)
Creatinine, Ser: 1.65 mg/dL — ABNORMAL HIGH (ref 0.61–1.24)
GFR, Estimated: 50 mL/min — ABNORMAL LOW (ref >60)
Glucose, Bld: 80 mg/dL (ref 70–99)
Potassium: 4 mmol/L (ref 3.5–5.1)
Sodium: 135 mmol/L (ref 135–145)
Total Bilirubin: 1.2 mg/dL (ref 0.3–1.2)
Total Protein: 6.2 g/dL — ABNORMAL LOW (ref 6.5–8.1)

## 2023-01-06 LAB — BRAIN NATRIURETIC PEPTIDE: B Natriuretic Peptide: 69.2 pg/mL (ref 0.0–100.0)

## 2023-01-06 LAB — PROTIME-INR
INR: 1.3 — ABNORMAL HIGH (ref 0.8–1.2)
Prothrombin Time: 16.3 seconds — ABNORMAL HIGH (ref 11.4–15.2)

## 2023-01-06 LAB — C-REACTIVE PROTEIN: CRP: 0.7 mg/dL (ref <1.0)

## 2023-01-06 LAB — SEDIMENTATION RATE: Sed Rate: 10 mm/hr (ref 0–20)

## 2023-01-06 LAB — LACTIC ACID, PLASMA
Lactic Acid, Venous: 0.8 mmol/L (ref 0.5–1.9)
Lactic Acid, Venous: 2 mmol/L (ref 0.5–1.9)

## 2023-01-06 LAB — LIPASE, BLOOD: Lipase: 26 U/L (ref 11–51)

## 2023-01-06 MED ORDER — SENNA 8.6 MG PO TABS
2.0000 | ORAL_TABLET | Freq: Every day | ORAL | Status: DC
  Filled 2023-01-06: qty 2

## 2023-01-06 MED ORDER — SODIUM CHLORIDE 0.9 % IV BOLUS
1000.0000 mL | Freq: Once | INTRAVENOUS | Status: AC
  Administered 2023-01-06: 1000 mL via INTRAVENOUS

## 2023-01-06 MED ORDER — OLANZAPINE 10 MG PO TABS: 10.0000 mg | ORAL_TABLET | Freq: Every day | ORAL | Status: DC

## 2023-01-06 MED ORDER — GEMFIBROZIL 600 MG PO TABS
600.0000 mg | ORAL_TABLET | Freq: Two times a day (BID) | ORAL | Status: DC
  Administered 2023-01-07: 600 mg via ORAL
  Filled 2023-01-06 (×4): qty 1

## 2023-01-06 MED ORDER — BUSPIRONE HCL 10 MG PO TABS
15.0000 mg | ORAL_TABLET | Freq: Three times a day (TID) | ORAL | Status: DC
  Filled 2023-01-06: qty 2

## 2023-01-06 MED ORDER — SODIUM CHLORIDE 0.9 % IV SOLN
1.0000 g | Freq: Once | INTRAVENOUS | Status: AC
  Administered 2023-01-06: 1 g via INTRAVENOUS
  Filled 2023-01-06: qty 10

## 2023-01-06 MED ORDER — LEVOTHYROXINE SODIUM 88 MCG PO TABS
88.0000 ug | ORAL_TABLET | Freq: Every day | ORAL | Status: DC
  Administered 2023-01-07: 88 ug via ORAL
  Filled 2023-01-06: qty 1

## 2023-01-06 MED ORDER — BENZTROPINE MESYLATE 0.5 MG PO TABS
1.0000 mg | ORAL_TABLET | Freq: Two times a day (BID) | ORAL | Status: DC
  Administered 2023-01-07: 1 mg via ORAL
  Filled 2023-01-06 (×3): qty 2

## 2023-01-06 MED ORDER — VITAMIN C 500 MG PO TABS
500.0000 mg | ORAL_TABLET | Freq: Every day | ORAL | Status: DC
  Administered 2023-01-07: 500 mg via ORAL
  Filled 2023-01-06 (×2): qty 1

## 2023-01-06 MED ORDER — POLYSACCHARIDE IRON COMPLEX 150 MG PO CAPS
150.0000 mg | ORAL_CAPSULE | Freq: Every day | ORAL | Status: DC
  Administered 2023-01-07: 150 mg via ORAL
  Filled 2023-01-06 (×2): qty 1

## 2023-01-06 MED ORDER — ASPIRIN 81 MG PO TBEC
81.0000 mg | DELAYED_RELEASE_TABLET | Freq: Every day | ORAL | Status: DC
  Administered 2023-01-07: 81 mg via ORAL
  Filled 2023-01-06 (×2): qty 1

## 2023-01-06 MED ORDER — HEPARIN SODIUM (PORCINE) 5000 UNIT/ML IJ SOLN
5000.0000 [IU] | Freq: Three times a day (TID) | INTRAMUSCULAR | Status: DC
  Administered 2023-01-06 – 2023-01-07 (×4): 5000 [IU] via SUBCUTANEOUS
  Filled 2023-01-06 (×4): qty 1

## 2023-01-06 MED ORDER — SODIUM CHLORIDE 0.9 % IV SOLN
1.0000 g | INTRAVENOUS | Status: DC
  Administered 2023-01-07: 1 g via INTRAVENOUS
  Filled 2023-01-06: qty 10

## 2023-01-06 MED ORDER — POLYETHYLENE GLYCOL 3350 17 G PO PACK
17.0000 g | PACK | Freq: Every day | ORAL | Status: DC
  Administered 2023-01-07: 17 g via ORAL
  Filled 2023-01-06 (×2): qty 1

## 2023-01-06 MED ORDER — ACETAMINOPHEN 325 MG PO TABS: 650.0000 mg | ORAL_TABLET | Freq: Four times a day (QID) | ORAL | Status: DC | PRN

## 2023-01-06 MED ORDER — HYDROXYZINE HCL 25 MG PO TABS: 50.0000 mg | ORAL_TABLET | ORAL | Status: DC | PRN

## 2023-01-06 MED ORDER — ONDANSETRON HCL 4 MG/2ML IJ SOLN: 4.0000 mg | Freq: Three times a day (TID) | INTRAMUSCULAR | Status: DC | PRN

## 2023-01-06 MED ORDER — OLANZAPINE 10 MG PO TABS
30.0000 mg | ORAL_TABLET | Freq: Every day | ORAL | Status: DC
  Filled 2023-01-06 (×2): qty 3

## 2023-01-06 MED ORDER — HYDRALAZINE HCL 20 MG/ML IJ SOLN: 5.0000 mg | INTRAMUSCULAR | Status: DC | PRN

## 2023-01-06 MED ORDER — ALBUTEROL SULFATE (2.5 MG/3ML) 0.083% IN NEBU: 3.0000 mL | INHALATION_SOLUTION | RESPIRATORY_TRACT | Status: DC | PRN

## 2023-01-06 MED ORDER — VITAMIN D 25 MCG (1000 UNIT) PO TABS
1000.0000 [IU] | ORAL_TABLET | Freq: Every day | ORAL | Status: DC
  Administered 2023-01-07: 1000 [IU] via ORAL
  Filled 2023-01-06 (×2): qty 1

## 2023-01-06 MED ORDER — FOLIC ACID 1 MG PO TABS
1.0000 mg | ORAL_TABLET | Freq: Every day | ORAL | Status: DC
  Administered 2023-01-07: 1 mg via ORAL
  Filled 2023-01-06 (×2): qty 1

## 2023-01-06 MED ORDER — FUROSEMIDE 40 MG PO TABS: 40.0000 mg | ORAL_TABLET | Freq: Every day | ORAL | Status: DC

## 2023-01-06 MED ORDER — DIPHENHYDRAMINE HCL 50 MG/ML IJ SOLN: 12.5000 mg | Freq: Three times a day (TID) | INTRAMUSCULAR | Status: DC | PRN

## 2023-01-06 MED ORDER — VANCOMYCIN HCL IN DEXTROSE 1-5 GM/200ML-% IV SOLN
1000.0000 mg | Freq: Once | INTRAVENOUS | Status: AC
  Administered 2023-01-06: 1000 mg via INTRAVENOUS
  Filled 2023-01-06: qty 200

## 2023-01-06 MED ORDER — SIMVASTATIN 20 MG PO TABS
20.0000 mg | ORAL_TABLET | Freq: Every day | ORAL | Status: DC
  Filled 2023-01-06: qty 1

## 2023-01-06 MED ORDER — SODIUM CHLORIDE 0.9 % IV BOLUS
500.0000 mL | Freq: Once | INTRAVENOUS | Status: AC
  Administered 2023-01-06: 500 mL via INTRAVENOUS

## 2023-01-06 MED ORDER — VANCOMYCIN VARIABLE DOSE PER UNSTABLE RENAL FUNCTION (PHARMACIST DOSING): Status: DC

## 2023-01-06 MED ORDER — DIVALPROEX SODIUM 500 MG PO DR TAB
750.0000 mg | DELAYED_RELEASE_TABLET | Freq: Two times a day (BID) | ORAL | Status: DC
  Administered 2023-01-07: 750 mg via ORAL
  Filled 2023-01-06 (×3): qty 1

## 2023-01-06 MED ORDER — OXYCODONE-ACETAMINOPHEN 5-325 MG PO TABS: 1.0000 | ORAL_TABLET | ORAL | Status: DC | PRN

## 2023-01-06 NOTE — H&P (Addendum)
History and Physical    Derrick Atkins:[id number] DOB: 1970/08/26 DOA: 01/06/2023  Referring MD/NP/PA:   PCP: Gracelyn Nurse, MD   Patient coming from:  The patient is coming from group home.     Chief Complaint: pain in left lower leg  HPI: Derrick Atkins is a 52 y.o. male with medical history significant of schizophrenia, hypertension, hyperlipidemia, diabetes mellitus, hypothyroidism, anemia, cerebral palsy, aggressive behavior, who presents with pain in left lower leg.  Patient was recently hospitalized from 6/3 - 6/13 due to left lower leg cellulitis with abscess.  Patient is s/p of I&D by Dr. Claudine Mouton on 6/10.  Patient was treated with vancomycin and Zosyn in ED. Wound culture was positive for MSSA.  Patient was discharged on Keflex.  Pt reports increased pain and swelling in left lower leg in the past 2 days. He was started on Bactrim.  Since then, he has developed nausea and vomiting. Patient has had several episodes of nonbilious nonbloody vomiting, denies diarrhea or abdominal pain.  His abdomen is mildly distended. He has had difficult time tolerating his antibiotics. The pain is constant, moderate to severe, sharp, nonradiating.  Patient has blisters in left lower leg. Patient denies fever or chills.  Denies chest pain, cough, shortness of breath. Denies symptoms of UTI.  Data reviewed independently and ED Course: pt was found to have WBC 9.7,  AKI  with creatinine 1.65, BUN 20, GFR 50 (baseline creatinine 0.87 on 12/23/2022), lipase 26, liver function (ALP 60, AST 48, ALT 17, total bilirubin 1.2), temperature normal, blood pressure 131/78, heart rate 88, RR 18, oxygen saturation 100% on room air.  X-ray of left tibia/fibular showed soft tissue swelling, negative for gas.  Patient is placed on MedSurg bed for observation.   EKG: Not done in ED, will get one.    Review of Systems:   General: no fevers, chills, no body weight gain, has fatigue HEENT: no blurry vision,  hearing changes or sore throat Respiratory: no dyspnea, coughing, wheezing CV: no chest pain, no palpitations GI: has nausea, vomiting, no abdominal pain, diarrhea, constipation GU: no dysuria, burning on urination, increased urinary frequency, hematuria  Ext: has leg edema, pain in left  lower leg Neuro: no unilateral weakness, numbness, or tingling, no vision change or hearing loss Skin: no rash, no skin tear. MSK: No muscle spasm, no deformity, no limitation of range of movement in spin Heme: No easy bruising.  Travel history: No recent long distant travel.   Allergy:  Allergies  Allergen Reactions   Phenytoin Sodium Extended Other (See Comments) and Nausea And Vomiting   Prednisone Hives and Nausea And Vomiting   Latex Hives, Nausea And Vomiting and Rash    Past Medical History:  Diagnosis Date   Cerebral palsy (HCC)    Depression    Diabetes mellitus without complication (HCC)    Hypertension    Kidney stones    Schizoaffective disorder (HCC)    Scoliosis     Past Surgical History:  Procedure Laterality Date   BOWEL RESECTION     CATARACT EXTRACTION W/PHACO Right 09/15/2022   Procedure: CATARACT EXTRACTION PHACO AND INTRAOCULAR LENS PLACEMENT (IOC) RIGHT DIABETIC VISION BLUE HEALON 5  15.79  01:31.9;  Surgeon: Lockie Mola, MD;  Location: [facility];  Service: Ophthalmology;  Laterality: Right;  Latex Diabetic   INCISION AND DRAINAGE ABSCESS Left 12/20/2022   Procedure: INCISION AND DRAINAGE ABSCESS;  Surgeon: Campbell Lerner, MD;  Location: [facility];  Service: General;  Laterality: Left;   LAPAROTOMY N/A 05/15/2020   Procedure: EXPLORATORY LAPAROTOMY;  Surgeon: Henrene Dodge, MD;  Location: [facility];  Service: General;  Laterality: N/A;    Social History:  reports that he has never smoked. He has never been exposed to tobacco smoke. He has never used smokeless tobacco. He reports that he does not drink alcohol and does not use drugs.  Family History:   Family History  Problem Relation Age of Onset   Heart disease Father    Heart attack Father      Prior to Admission medications   Medication Sig Start Date End Date Taking? Authorizing Provider  ABILIFY MAINTENA 400 MG PRSY prefilled syringe Inject 400 mg into the muscle every 28 (twenty-eight) days.    [provider]  albuterol (VENTOLIN HFA) 108 (90 Base) MCG/ACT inhaler Inhale 1-2 puffs into the lungs every 4 (four) hours as needed. 08/16/22   [provider]  ascorbic acid (VITAMIN C) 500 MG tablet Take 1 tablet (500 mg total) by mouth daily. 12/24/22   Marcelino Duster, MD  aspirin EC 81 MG tablet Take 1 tablet (81 mg total) by mouth daily. Swallow whole. 04/09/22   [name], [name], MD  benztropine (COGENTIN) 1 MG tablet Take 1 tablet (1 mg total) by mouth daily. Patient taking differently: Take 1 mg by mouth 2 (two) times daily. 04/08/22   [name], [name], MD  busPIRone (BUSPAR) 15 MG tablet Take 15 mg by mouth 3 (three) times daily. 12/11/22   [provider]  cephALEXin (KEFLEX) 500 MG capsule Take 500 mg by mouth 4 (four) times daily.    [provider]  cholecalciferol (VITAMIN D3) 25 MCG (1000 UNIT) tablet Take 1,000 Units by mouth daily.    [provider]  divalproex (DEPAKOTE) 250 MG DR tablet Take 3 tablets (750 mg total) by mouth every 12 (twelve) hours. 04/08/22   [name], [name], MD  fenofibrate 54 MG tablet Take 1 tablet (54 mg total) by mouth daily. 04/08/22   [name], [name], MD  folic acid (FOLVITE) 1 MG tablet Take 1 tablet (1 mg total) by mouth daily. 12/24/22   Marcelino Duster, MD  gemfibrozil (LOPID) 600 MG tablet Take 1 tablet (600 mg total) by mouth 2 (two) times daily. 04/08/22   [name], [name], MD  hydrOXYzine (ATARAX) 50 MG tablet Take 1 tablet (50 mg total) by mouth every 4 (four) hours as needed for anxiety. 04/08/22   [name], [name], MD  iron polysaccharides (NIFEREX) 150 MG capsule Take 1 capsule (150 mg total)  by mouth daily. 12/24/22   Marcelino Duster, MD  levothyroxine (SYNTHROID) 88 MCG tablet Take 1 tablet (88 mcg total) by mouth daily at 6 (six) AM. 04/09/22   [name], [name], MD  OLANZapine (ZYPREXA) 10 MG tablet Take 10 mg by mouth at bedtime.    [provider]  OLANZapine (ZYPREXA) 20 MG tablet Take 1 tablet (20 mg total) by mouth at bedtime. 04/08/22   [name], [name], MD  polyethylene glycol (MIRALAX / GLYCOLAX) 17 g packet Take 17 g by mouth daily.    [provider]  simvastatin (ZOCOR) 20 MG tablet Take 1 tablet (20 mg total) by mouth at bedtime. 04/08/22   [name], [name], MD  sulfamethoxazole-trimethoprim (BACTRIM DS) 800-160 MG tablet Take 1 tablet by mouth 2 (two) times daily for 10 days. 01/05/23 01/15/23  Merwyn Katos, MD    Physical Exam: Vitals:   01/06/23 1610 01/06/23  1610 01/06/23 0928 01/06/23 1337  BP:  130/64  131/78  Pulse:  88  80  Resp:  18  18  Temp:  97.8 F (36.6 C)  98 F (36.7 C)  TempSrc:  Oral  Oral  SpO2:  100%  100%  Weight: 86.2 kg  86.2 kg   Height: 6\' 3"  (1.905 m)  6\' 3"  (1.905 m)    General: Not in acute distress HEENT:       Eyes: PERRL, EOMI, no jaundice       ENT: No discharge from the ears and nose, no pharynx injection, no tonsillar enlargement.        Neck: No JVD, no bruit, no mass felt. Heme: No neck lymph node enlargement. Cardiac: S1/S2, RRR, No murmurs, No gallops or rubs. Respiratory: No rales, wheezing, rhonchi or rubs. GI: Soft, mildy distended, nontender, no rebound pain, no organomegaly, BS present. GU: No hematuria Ext: 2+ bilateral lower leg edema, has blisters in left lower leg.  Left lower leg is tender, mild erythema, warm, with blisters, has wounds from previous I&D.      Musculoskeletal: No joint deformities, No joint redness or warmth, no limitation of ROM in spin. Skin: No rashes.  Neuro: Lethargic, but arousable, oriented X3, cranial nerves II-XII grossly intact, moves all extremities  Psych:  Patient is not psychotic, no suicidal or hemocidal ideation. Pt is calm now  Labs on Admission: I have personally reviewed following labs and imaging studies  CBC: No results for input(s): "WBC", "NEUTROABS", "HGB", "HCT", "MCV", "PLT" in the last 168 hours. Basic Metabolic Panel: Recent Labs  Lab 01/05/23 1742 01/06/23 0929  NA 138 135  K 3.8 4.0  CL 110 109  CO2 19* 18*  GLUCOSE 99 80  BUN 23* 20  CREATININE 1.85* 1.65*  CALCIUM 8.5* 8.7*   GFR: Estimated Creatinine Clearance: 64 mL/min (A) (by C-G formula based on SCr of 1.65 mg/dL (H)). Liver Function Tests: Recent Labs  Lab 01/05/23 1742 01/06/23 0929  AST 41 48*  ALT 14 17  ALKPHOS 57 60  BILITOT 1.2 1.2  PROT 6.4* 6.2*  ALBUMIN 3.1* 3.1*   Recent Labs  Lab 01/06/23 0929  LIPASE 26   No results for input(s): "AMMONIA" in the last 168 hours. Coagulation Profile: Recent Labs  Lab 01/06/23 1223  INR 1.3*   Cardiac Enzymes: No results for input(s): "CKTOTAL", "CKMB", "CKMBINDEX", "TROPONINI" in the last 168 hours. BNP (last 3 results) No results for input(s): "PROBNP" in the last 8760 hours. HbA1C: No results for input(s): "HGBA1C" in the last 72 hours. CBG: Recent Labs  Lab 01/05/23 1735  GLUCAP 100*   Lipid Profile: No results for input(s): "CHOL", "HDL", "LDLCALC", "TRIG", "CHOLHDL", "LDLDIRECT" in the last 72 hours. Thyroid Function Tests: No results for input(s): "TSH", "T4TOTAL", "FREET4", "T3FREE", "THYROIDAB" in the last 72 hours. Anemia Panel: No results for input(s): "VITAMINB12", "FOLATE", "FERRITIN", "TIBC", "IRON", "RETICCTPCT" in the last 72 hours. Urine analysis:    Component Value Date/Time   COLORURINE YELLOW (A) 01/06/2023 1259   APPEARANCEUR CLEAR (A) 01/06/2023 1259   APPEARANCEUR Clear 12/14/2012 0619   LABSPEC 1.017 01/06/2023 1259   LABSPEC 1.030 12/14/2012 0619   PHURINE 5.0 01/06/2023 1259   GLUCOSEU NEGATIVE 01/06/2023 1259   GLUCOSEU 50 mg/dL 96/10/5407 8119    HGBUR NEGATIVE 01/06/2023 1259   BILIRUBINUR NEGATIVE 01/06/2023 1259   BILIRUBINUR Negative 12/14/2012 0619   KETONESUR NEGATIVE 01/06/2023 1259   PROTEINUR NEGATIVE 01/06/2023 1259   NITRITE NEGATIVE  01/06/2023 1259   LEUKOCYTESUR NEGATIVE 01/06/2023 1259   LEUKOCYTESUR Negative 12/14/2012 0619   Sepsis Labs: @LABRCNTIP (procalcitonin:4,lacticidven:4) )No results found for this or any previous visit (from the past 240 hour(s)).   Radiological Exams on Admission: DG Tibia/Fibula Left  Result Date: 01/06/2023 CLINICAL DATA:  Pain and swelling EXAM: LEFT TIBIA AND FIBULA - 2 VIEW COMPARISON:  Previous studies including the examination of 01/05/2023 FINDINGS: No fracture or dislocation is seen. No focal lytic lesions are seen. There is no periosteal new bone formation. There is diffuse edema in subcutaneous plane. There are faint lucencies in the anterior aspect of left lower leg suggesting possible open wounds in the skin. There are few smooth marginated soft tissue nodular densities measuring up to 1.4 cm in diameter in the anterior aspect of left lower leg. Significance of this finding is not clear. There are no opaque foreign bodies. IMPRESSION: No fracture or dislocation is seen. There are no focal lytic lesions in left tibia and fibula. There is soft tissue swelling in left lower leg with interval worsening. No opaque foreign bodies are seen. Electronically Signed   By: Ernie Avena M.D.   On: 01/06/2023 11:53   DG Tibia/Fibula Left  Result Date: 01/05/2023 CLINICAL DATA:  Worsening chronic ulcer inferior left lower foreleg. Patient unable to assist with positioning. Best possible films obtained. EXAM: LEFT TIBIA AND FIBULA - 2 VIEW COMPARISON:  CT left tibia fibula with contrast 12/20/2022 FINDINGS: There is no evidence of fracture or other focal bone lesions. No findings suspicious for acute osteomyelitis or other primary pathologic process. As seen previously there is diffuse edema  in the foreleg. On the third image, there is a faint lucency in the anterior soft tissues in the distal foreleg which probably corresponds to the draining subcutaneous abscess noted on CT, measuring 2.3 cm in height. Another faint subcutaneous lucency 3-5 cm above this level is also noted and could indicate a second abscess pocket, measuring 2.1 cm height. The more inferior collection did appear to extend up to this level on the CT. No other focal abnormality is evident.  No foreign body is seen. There is mild degenerative arthrosis of the medial femorotibial joint and the tibiotalar joint. IMPRESSION: 1. No findings suspicious for acute osteomyelitis or other primary pathologic process. 2. Diffuse edema in the foreleg. 3. Faint lucencies in the anterior soft tissues in the distal foreleg, probably corresponding to the draining subcutaneous abscess noted on CT. 4. 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 Electronically Signed   By: Almira Bar M.D.   On: 01/05/2023 20:10   CT Head Wo Contrast  Result Date: 01/05/2023 CLINICAL DATA:  Hypoglycemia, confusion EXAM: CT HEAD WITHOUT CONTRAST TECHNIQUE: Contiguous axial images were obtained from the base of the skull through the vertex without intravenous contrast. RADIATION DOSE REDUCTION: This exam was performed according to the departmental dose-optimization program which includes automated exposure control, adjustment of the mA and/or kV according to patient size and/or use of iterative reconstruction technique. COMPARISON:  01/18/2022 FINDINGS: Brain: No evidence of acute infarction, hemorrhage, mass, mass effect, or midline shift. No hydrocephalus or extra-axial fluid collection. Somewhat advanced cerebral atrophy for age. Vascular: No hyperdense vessel. Atherosclerotic calcifications in the intracranial carotid and vertebral arteries. Skull: Negative for fracture or focal lesion. Sinuses/Orbits: Mucosal thickening in the ethmoid air cells. Status  post right lens replacement. Other: The mastoid air cells are well aerated. IMPRESSION: No acute intracranial process. Electronically Signed   By: Wiliam Ke  M.D.   On: 01/05/2023 18:15      Assessment/Plan Principal Problem:   Cellulitis and abscess of left lower extremity Active Problems:   Nausea & vomiting   Type 2 diabetes mellitus without complications (HCC)   AKI (acute kidney injury) (HCC)   Essential hypertension   Schizophrenia, paranoid, chronic (HCC)   Hypothyroidism   Dyslipidemia   Iron deficiency anemia   Assessment and Plan:   Cellulitis and abscess of left lower extremity: pt is s/p I&D on 6/10. Finished course of Keflex, developing worsening pain, consistent with cellulitis. Could not tolerate Bactrim. Lactic acid is 2.0, but patient does not meets criteria for sepsis (WBC 9.7, HR 73 --> 97 --> 80s now, RR 18 and normal temperature). Pt has 2+ bilateral leg edema.  Will need to rule out DVT.  - will place on med-surg bed for obs - Empiric antimicrobial treatment with vancomycin, Rocephin - PRN Zofran for nausea, tylenol and Percocet for pain - Blood cultures x 2  - ESR and CRP - wound care consult - IVF: 1.5L of NS bolus in ED, will not give more IVF - will check BNP due to bilateral leg edema -Follow-up lower extremity venous Doppler to rule out DVT -Follow-up CT scan of left lower leg --> no discrete fluid collection to suggest a drainable soft tissue abscess.  Nausea & vomiting: Etiology is not clear.  Lipase normal.   -f/u CT-abd/pelvis --> no bowel obstruction, showed constipation.  Patient is 7.6 today, stable.  Hemoglobin MiraLAX and senna code  Diet controled type 2 diabetes mellitus without complications (HCC): Recent A1c 4.7 on 10/13/2022.  Well-controlled.  Patient not taking medications currently.  CBG 100 -No treatment needed now  AKI (acute kidney injury) (HCC): Likely due to dehydration and Bactrim use -Avoid using renal toxic  medications. -Patient received 1 L normal saline bolus  Essential hypertension: Blood pressure 131/78.  Patient is not taking medications currently -IV hydralazine as needed  Schizophrenia, paranoid, chronic (HCC) -Continue home medications, hydroxyzine, olanzapine, Depakote, Cogentin -will hold buspar due to QT prolongation (503)  Hypothyroidism -Synthroid  Dyslipidemia -Lopid and Zocor  Iron deficiency anemia: stable HH. Hemoglobin 7.6 on 12/23/2022 --> 7.6 today -Continue home iron supplement        DVT ppx: SQ Heparin   Code Status: Full code   Family Communication: Yes, patient's legal guardian, Ms. Goble by phone  Disposition Plan:  Anticipate discharge back to previous environment, grou  Consults called:  none  Admission status and Level of care: Med-Surg: for obs    Dispo: The patient is from: Group home              Anticipated d/c is to: Group home              Anticipated d/c date is: 1 day                Patient currently is not medically stable to d/c.    Severity of Illness:  The appropriate patient status for this patient is OBSERVATION. Observation status is judged to be reasonable and necessary in order to provide the required intensity of service to ensure the patient's safety. The patient's presenting symptoms, physical exam findings, and initial radiographic and laboratory data in the context of their medical condition is felt to place them at decreased risk for further clinical deterioration. Furthermore, it is anticipated that the patient will be medically stable for discharge from the hospital within 2 midnights of admission.  Date of Service 01/06/2023    Lorretta Harp [facility]   If 7PM-7AM, please contact night-coverage [email or web address] 01/06/2023, 4:10 PM History

## 2023-01-06 NOTE — ED Notes (Signed)
First Nurse Note: Patient to ED via ACMES from group home (EMS unsure of name) for hallucinations and vomiting. Seen at this facility yesterday for hypoglycemia.

## 2023-01-06 NOTE — Consult Note (Signed)
WOC Nurse Consult Note: Reason for Consult:LLE. Patient known to our service from last admission.  Wound type:infectious Pressure Injury POA: N/A Measurement:To be obtained by Bedside Rn and documented on the Nursing Flow Sheet with next dressing change Wound WUJ:WJXB, moist Drainage (amount, consistency, odor) serous, small Periwound:erythematous, edematous  Dressing procedure/placement/frequency: I have provided Nursing with guidance in the care of the LLE using a daily soap and water cleanse, rise and dry followed by covering the area with an antimicrobial dressing (silver hydrofiber). This is to be covered with dry gauze or an ABD and secured with Kerlix roll gauze topped with an ACE bandage. Heels are to be floated and a silicone foam placed to the sacrum for prophylactic PI use.  Recommend consultation with surgery as they were consulted on last admission. If you agree, please order/arrange.  WOC nursing team will not follow, but will remain available to this patient, the nursing and medical teams.  Please re-consult if needed.  Thank you for inviting [place] to participate in this patient's Plan of Care.  Ladona Mow, MSN, RN, CNS, GNP, Leda Min, [employer], [employer] phone:  [phone]

## 2023-01-06 NOTE — Consult Note (Addendum)
Pharmacy Antibiotic Note  ASSESSMENT: 52 y.o. male with PMH schizophrenia, HTN, HLD, DM, hypothyroid, anemia, cerebral palsy is presenting with LLE cellulitis. Pharmacy has been consulted to manage vancomycin dosing.  Patient measurements: Height: 6\' 3"  (190.5 cm) Weight: 86.2 kg (190 lb 0.6 oz) IBW/kg (Calculated) : 84.5  Vital signs: Temp: 98 F (36.7 C) (06/27 1337) Temp Source: Oral (06/27 1337) BP: 131/78 (06/27 1337) Pulse Rate: 80 (06/27 1337) Recent Labs  Lab 01/05/23 1742 01/06/23 0929  CREATININE 1.85* 1.65*   Estimated Creatinine Clearance: 64 mL/min (A) (by C-G formula based on SCr of 1.65 mg/dL (H)).  Allergies: Allergies  Allergen Reactions   Phenytoin Sodium Extended Other (See Comments) and Nausea And Vomiting   Prednisone Hives and Nausea And Vomiting   Latex Hives, Nausea And Vomiting and Rash    Antimicrobials this admission: Bactrim 6/26 x 1 Ceftriaxone 6/27 >> Vancomycin 6/27 >>  Dose adjustments this admission: N/A  Microbiology results: 6/27 BCx: sent  PLAN: Patient in AKI, with Scr 1.65 up from baseline of 0.8-1.0. Scr appears to be improving because it was 1.85 yesterday but, given these new antibiotics, renal function may worsen again tomorrow. Patient loaded with vancomycin 2000 mg this afternoon. Load of 2000 mg yields an estimated Cmax of 32 mg/L and with current renal function, estimated half-life is 12 hours. Will check vancomycin random with AM labs tomorrow (this will represent Follow up culture results to assess for antibiotic optimization. Monitor renal function to assess for any necessary antibiotic dosing changes.   Thank you for allowing pharmacy to be a part of this patient's care.  Will M. Dareen Piano, PharmD PGY-1 Pharmacy Resident 01/06/2023 2:02 PM

## 2023-01-06 NOTE — ED Triage Notes (Signed)
Pt here via ACMES with N/V. Pt denies abd pain. Pt denies hallucinations at this time.

## 2023-01-06 NOTE — ED Provider Notes (Signed)
[facility] Provider Note    Event Date/Time   First MD Initiated Contact with Patient 01/06/23 1031     (approximate)   History   Chief Complaint Leg Pain   HPI  Derrick Atkins is a 52 y.o. male with past medical history of hypertension, diabetes, schizoaffective disorder and cerebral palsy who presents to the ED complaining of leg pain.  Patient reports that he has had increased pain and swelling in his lower left leg over the past 2 days.  He required admission to the hospital earlier this month for cellulitis and abscess to this area, subsequently completed a course of Keflex.  He came back to the ED yesterday following 2 episodes of hypoglycemia with increased leg pain and swelling, was started on Bactrim.  Since then, he has developed nausea and vomiting, has had a difficult time tolerating his antibiotics.  He denies any fevers, but has been feeling increasingly weak with difficulty walking.  He denies any pain in his abdomen and has not had any diarrhea or dysuria.  He has noticed drainage from prior wound to his left lower leg.     Physical Exam   Triage Vital Signs: ED Triage Vitals  Enc Vitals Group     BP 01/06/23 0925 130/64     Pulse Rate 01/06/23 0925 88     Resp 01/06/23 0925 18     Temp 01/06/23 0925 97.8 F (36.6 C)     Temp Source 01/06/23 0925 Oral     SpO2 01/06/23 0925 100 %     Weight 01/06/23 0924 190 lb (86.2 kg)     Height 01/06/23 0924 6\' 3"  (1.905 m)     Head Circumference --      Peak Flow --      Pain Score 01/06/23 0928 0     Pain Loc --      Pain Edu? --      Excl. in GC? --     Most recent vital signs: Vitals:   01/06/23 0925 01/06/23 1337  BP: 130/64 131/78  Pulse: 88 80  Resp: 18 18  Temp: 97.8 F (36.6 C) 98 F (36.7 C)  SpO2: 100% 100%    Constitutional: Alert and oriented. Eyes: Conjunctivae are normal. Head: Atraumatic. Nose: No congestion/rhinnorhea. Mouth/Throat: Mucous membranes are moist.   Cardiovascular: Normal rate, regular rhythm. Grossly normal heart sounds.  2+ radial and DP pulses bilaterally. Respiratory: Normal respiratory effort.  No retractions. Lungs CTAB. Gastrointestinal: Soft and nontender. No distention. Musculoskeletal: Erythema, edema, and warmth noted circumferentially to left lower leg with wound anteriorly just above the ankle with purulent drainage. Neurologic:  Normal speech and language. No gross focal neurologic deficits are appreciated.    ED Results / Procedures / Treatments   Labs (all labs ordered are listed, but only abnormal results are displayed) Labs Reviewed  COMPREHENSIVE METABOLIC PANEL - Abnormal; Notable for the following components:      Result Value   CO2 18 (*)    Creatinine, Ser 1.65 (*)    Calcium 8.7 (*)    Total Protein 6.2 (*)    Albumin 3.1 (*)    AST 48 (*)    GFR, Estimated 50 (*)    All other components within normal limits  URINALYSIS, ROUTINE W REFLEX MICROSCOPIC - Abnormal; Notable for the following components:   Color, Urine YELLOW (*)    APPearance CLEAR (*)    All other components within normal limits  CULTURE,  BLOOD (ROUTINE X 2)  CULTURE, BLOOD (ROUTINE X 2)  LIPASE, BLOOD  LACTIC ACID, PLASMA  CBC WITH DIFFERENTIAL/PLATELET  CBC  LACTIC ACID, PLASMA   RADIOLOGY Left tib-fib x-ray reviewed and interpreted by me with soft tissue swelling, no findings concerning for osteomyelitis.  PROCEDURES:  Critical Care performed: No  Procedures   MEDICATIONS ORDERED IN ED: Medications  vancomycin (VANCOCIN) IVPB 1000 mg/200 mL premix (1,000 mg Intravenous New Bag/Given 01/06/23 1332)  oxyCODONE-acetaminophen (PERCOCET/ROXICET) 5-325 MG per tablet 1 tablet (has no administration in time range)  ondansetron (ZOFRAN) injection 4 mg (has no administration in time range)  acetaminophen (TYLENOL) tablet 650 mg (has no administration in time range)  hydrALAZINE (APRESOLINE) injection 5 mg (has no administration in  time range)  cefTRIAXone (ROCEPHIN) 1 g in sodium chloride 0.9 % 100 mL IVPB (0 g Intravenous Stopped 01/06/23 1332)     IMPRESSION / MDM / ASSESSMENT AND PLAN / ED COURSE  I reviewed the triage vital signs and the nursing notes.                              52 y.o. male with past medical history of hypertension, diabetes, schizoaffective disorder, and cerebral palsy who presents to the ED complaining of left lower leg pain and swelling following recent admission for cellulitis and abscess.  Patient's presentation is most consistent with acute presentation with potential threat to life or bodily function.  Differential diagnosis includes, but is not limited to, sepsis, osteomyelitis, cellulitis, recurrent abscess, electrolyte abnormality, AKI, UTI.  Patient chronically ill-appearing but nontoxic and in no acute distress, vital signs are reassuring and do not appear concerning for sepsis.  He does have erythema, warmth, and tenderness to his left lower leg concerning for developing recurrent cellulitis.  He has an open wound with purulent drainage, no associated fluctuance concerning for worsening abscess.  He remains neurovascular intact distally, we will start on IV antibiotics as he was unable to tolerate oral antibiotics prescribed yesterday.  X-ray shows no evidence of osteomyelitis, but he may require MRI to rule this out.  Labs with mild AKI, no acute electrolyte abnormality noted, lactic acid within normal limits.  There was a delay in the result of patient CBC, apparently lab has been resulted but has not crossed over into epic.  Results obtained verbally and discussion with the lab, patient with hemoglobin of 8.9 and WBC of 3.1.  Lactic acid within normal limits and suspicion for sepsis remains low.  Case discussed with hospitalist for admission.      FINAL CLINICAL IMPRESSION(S) / ED DIAGNOSES   Final diagnoses:  Left leg cellulitis  Nausea and vomiting, unspecified vomiting type      Rx / DC Orders   ED Discharge Orders     None        Note:  This document was prepared using Dragon voice recognition software and may include unintentional dictation errors.   Chesley Noon, MD 01/06/23 1401

## 2023-01-07 ENCOUNTER — Observation Stay: Payer: 59

## 2023-01-07 DIAGNOSIS — L03116 Cellulitis of left lower limb: Secondary | ICD-10-CM | POA: Diagnosis not present

## 2023-01-07 DIAGNOSIS — L02416 Cutaneous abscess of left lower limb: Secondary | ICD-10-CM | POA: Diagnosis not present

## 2023-01-07 LAB — CBC
HCT: 28.4 % — ABNORMAL LOW (ref 39.0–52.0)
Hemoglobin: 8.9 g/dL — ABNORMAL LOW (ref 13.0–17.0)
MCH: 28.3 pg (ref 26.0–34.0)
MCHC: 31.3 g/dL (ref 30.0–36.0)
MCV: 90.2 fL (ref 80.0–100.0)
Platelets: 83 K/uL — ABNORMAL LOW (ref 150–400)
RBC: 3.15 MIL/uL — ABNORMAL LOW (ref 4.22–5.81)
RDW: 15.8 % — ABNORMAL HIGH (ref 11.5–15.5)
WBC: 3 K/uL — ABNORMAL LOW (ref 4.0–10.5)
nRBC: 0 % (ref 0.0–0.2)

## 2023-01-07 LAB — BASIC METABOLIC PANEL WITH GFR
Anion gap: 7 (ref 5–15)
BUN: 16 mg/dL (ref 6–20)
CO2: 22 mmol/L (ref 22–32)
Calcium: 8.3 mg/dL — ABNORMAL LOW (ref 8.9–10.3)
Chloride: 110 mmol/L (ref 98–111)
Creatinine, Ser: 1.39 mg/dL — ABNORMAL HIGH (ref 0.61–1.24)
GFR, Estimated: >60 mL/min
Glucose, Bld: 77 mg/dL (ref 70–99)
Potassium: 4.1 mmol/L (ref 3.5–5.1)
Sodium: 139 mmol/L (ref 135–145)

## 2023-01-07 LAB — CULTURE, BLOOD (ROUTINE X 2): Culture: NO GROWTH

## 2023-01-07 LAB — VANCOMYCIN, RANDOM: Vancomycin Rm: 10 ug/mL

## 2023-01-07 MED ORDER — CEPHALEXIN 500 MG PO CAPS: 500.0000 mg | ORAL_CAPSULE | Freq: Three times a day (TID) | ORAL | 0 refills | Status: DC

## 2023-01-07 MED ORDER — CEPHALEXIN 500 MG PO CAPS: 500.0000 mg | ORAL_CAPSULE | Freq: Three times a day (TID) | ORAL | 0 refills | Status: AC

## 2023-01-07 MED ORDER — ROSUVASTATIN CALCIUM 5 MG PO TABS: 5.0000 mg | ORAL_TABLET | Freq: Every day | ORAL | 0 refills | Status: DC

## 2023-01-07 MED ORDER — ROSUVASTATIN CALCIUM 10 MG PO TABS: 5.0000 mg | ORAL_TABLET | Freq: Every day | ORAL | Status: DC

## 2023-01-07 MED ORDER — CEPHALEXIN 500 MG PO CAPS: 500.0000 mg | ORAL_CAPSULE | Freq: Three times a day (TID) | ORAL | Status: DC

## 2023-01-07 NOTE — NC FL2 (Signed)
[place] MEDICAID FL2 LEVEL OF CARE FORM     IDENTIFICATION  Patient Name: Derrick Atkins Birthdate: [birth year] Sex: male Admission Date (Current Location): 01/06/2023  [facility] and [place] Number:  Chiropodist and Address:         Provider Number: [id number]  Attending Physician Name and Address:  Enedina Finner, MD  Relative Name and Phone Number:       Current Level of Care: Hospital Recommended Level of Care: Other (Comment) (group home) Prior Approval Number:    Date Approved/Denied:   PASRR Number:    Discharge Plan: Other (Comment) (group home)    Current Diagnoses: Patient Active Problem List   Diagnosis Date Noted   Cellulitis of left lower extremity 01/06/2023   Iron deficiency anemia 01/06/2023   Cellulitis and abscess of left lower extremity 01/06/2023   Nausea & vomiting 01/06/2023   AKI (acute kidney injury) (HCC) 12/13/2022   Dyslipidemia 12/13/2022   Schizophrenia (HCC) 03/23/2022   Aggressive behavior    Schizophrenia, paranoid, chronic (HCC)    Long QT interval 04/16/2014   Hypothyroidism 12/25/2013   Essential hypertension 05/03/2007   Type 2 diabetes mellitus without complications (HCC) 05/03/2007    Orientation RESPIRATION BLADDER Height & Weight     Self, Situation  Normal Continent Weight: 86.2 kg Height:  6\' 3"  (190.5 cm)  BEHAVIORAL SYMPTOMS/MOOD NEUROLOGICAL BOWEL NUTRITION STATUS      Incontinent Diet (low sodium)  AMBULATORY STATUS COMMUNICATION OF NEEDS Skin   Supervision Verbally Surgical wounds (see dc summary for wound care)                       Personal Care Assistance Level of Assistance              Functional Limitations Info             SPECIAL CARE FACTORS FREQUENCY                       Contractures Contractures Info: Not present    Additional Factors Info  Code Status, Allergies Code Status Info: full Allergies Info: phenytoinsodium extended, prednisone, latex            Medication List       STOP taking these medications     fenofibrate 54 MG tablet    simvastatin 20 MG tablet Commonly known as: ZOCOR    sulfamethoxazole-trimethoprim 800-160 MG tablet Commonly known as: BACTRIM DS           TAKE these medications     Abilify Maintena 400 MG Prsy prefilled syringe Generic drug: ARIPiprazole ER Inject 400 mg into the muscle every 28 (twenty-eight) days.    acetaminophen 500 MG tablet Commonly known as: TYLENOL Take 500 mg by mouth 3 (three) times daily as needed.    albuterol 108 (90 Base) MCG/ACT inhaler Commonly known as: VENTOLIN HFA Inhale 1-2 puffs into the lungs every 4 (four) hours as needed.    ascorbic acid 500 MG tablet Commonly known as: VITAMIN C Take 1 tablet (500 mg total) by mouth daily.    aspirin EC 81 MG tablet Take 1 tablet (81 mg total) by mouth daily. Swallow whole.    benztropine 1 MG tablet Commonly known as: COGENTIN Take 1 tablet (1 mg total) by mouth daily. What changed: when to take this    busPIRone 15 MG tablet Commonly known as: BUSPAR Take 15 mg by mouth 3 (three) times  daily.    cephALEXin 500 MG capsule Commonly known as: KEFLEX Take 1 capsule (500 mg total) by mouth every 8 (eight) hours for 7 days. Start taking on: January 08, 2023 What changed: when to take this    cholecalciferol 25 MCG (1000 UNIT) tablet Commonly known as: VITAMIN D3 Take 1,000 Units by mouth daily.    divalproex 250 MG DR tablet Commonly known as: DEPAKOTE Take 3 tablets (750 mg total) by mouth every 12 (twelve) hours.    folic acid 1 MG tablet Commonly known as: FOLVITE Take 1 tablet (1 mg total) by mouth daily.    gemfibrozil 600 MG tablet Commonly known as: LOPID Take 1 tablet (600 mg total) by mouth 2 (two) times daily.    hydrOXYzine 50 MG tablet Commonly known as: ATARAX Take 1 tablet (50 mg total) by mouth every 4 (four) hours as needed for anxiety.    iron polysaccharides 150 MG capsule Commonly  known as: NIFEREX Take 1 capsule (150 mg total) by mouth daily.    levothyroxine 88 MCG tablet Commonly known as: SYNTHROID Take 1 tablet (88 mcg total) by mouth daily at 6 (six) AM.    OLANZapine 10 MG tablet Commonly known as: ZYPREXA Take 10 mg by mouth at bedtime.    OLANZapine 20 MG tablet Commonly known as: ZYPREXA Take 1 tablet (20 mg total) by mouth at bedtime.    polyethylene glycol 17 g packet Commonly known as: MIRALAX / GLYCOLAX Take 17 g by mouth daily.    rosuvastatin 5 MG tablet Commonly known as: CRESTOR Take 1 tablet (5 mg total) by mouth at bedtime.    senna 8.6 MG Tabs tablet Commonly known as: SENOKOT Take 2 tablets by mouth at bedtime.     Relevant Imaging Results:  Relevant Lab Results:   Additional Information SS #: [id number]  Chapman Fitch, RN

## 2023-01-07 NOTE — TOC Initial Note (Addendum)
Transition of Care [facility]) - Initial/Assessment Note    Patient Details  Name: Derrick Atkins MRN: [id number] Date of Birth: [birth year]  [facility] CM/SW Contact:    Chapman Fitch, RN Phone Number: 01/07/2023, 4:15 PM  Clinical Narrative:                    Admitted [id number] [facility]  Current home health/prior home health/DME:RN thought [facility]. RW    Patient to DC today.  VM left for [place] with DSS to review obs letter.  Awaiting return call MD has updated DSS that patient to discharge  DC summary and Fl2 secure emailed to [place] at [facility] [place] with [facility] notified of discharge Bedside RN provided with number to call report, and arrange transport with group home  Patient Goals and CMS Choice            Expected Discharge Plan and Services         Expected Discharge Date: 01/07/23                                    Prior Living Arrangements/Services                       Activities of Daily Living   ADL Screening (condition at time of admission) Patient's cognitive ability adequate to safely complete daily activities?: No Is the patient deaf or have difficulty hearing?: No Does the patient have difficulty seeing, even when wearing glasses/contacts?: No Does the patient have difficulty concentrating, remembering, or making decisions?: Yes Patient able to express need for assistance with ADLs?: Yes Does the patient have difficulty dressing or bathing?: Yes Independently performs ADLs?: No Communication: Independent Dressing (OT): Needs assistance Grooming: Needs assistance Feeding: Independent Bathing: Needs assistance Toileting: Needs assistance In/Out Bed: Needs assistance Walks in Home: Needs assistance Does the patient have difficulty walking or climbing stairs?: Yes Weakness of Arms/Hands: Both  Permission Sought/Granted                  Emotional Assessment               Admission diagnosis:  Cellulitis of left lower extremity [L03.116] Left leg cellulitis [L03.116] Cellulitis and abscess of left lower extremity [L03.116, L02.416] Nausea and vomiting, unspecified vomiting type [R11.2] Patient Active Problem List   Diagnosis Date Noted   Cellulitis of left lower extremity 01/06/2023   Iron deficiency anemia 01/06/2023   Cellulitis and abscess of left lower extremity 01/06/2023   Nausea & vomiting 01/06/2023   AKI (acute kidney injury) (HCC) 12/13/2022   Dyslipidemia 12/13/2022   Schizophrenia (HCC) 03/23/2022   Aggressive behavior    Schizophrenia, paranoid, chronic (HCC)    Long QT interval 04/16/2014   Hypothyroidism 12/25/2013   Essential hypertension 05/03/2007   Type 2 diabetes mellitus without complications (HCC) 05/03/2007   PCP:  Gracelyn Nurse, MD Pharmacy:   [facility] - [place], [place] - [address] [place] [address] Phone: [phone] Fax: [phone]  [facility] - [place], [place] - [address] [place] [address] Phone: [phone] Fax: [phone]     Social Determinants of Health (SDOH) Social History: SDOH Screenings   Food Insecurity: Food Insecurity Present (01/06/2023)  Housing: Low Risk  (01/06/2023)  Transportation Needs: Unmet Transportation Needs (  01/06/2023)  Utilities: Not At Risk (01/06/2023)  Alcohol Screen: Low Risk  (03/23/2022)  Tobacco Use: Low Risk  (01/06/2023)   SDOH Interventions:     Readmission Risk Interventions     No data to display

## 2023-01-07 NOTE — Discharge Summary (Signed)
Physician Discharge Summary   Patient: Derrick Atkins MRN: [id number] DOB: 04-Feb-1971  Admit date:     01/06/2023  Discharge date: 01/07/23  Discharge Physician: Enedina Finner   PCP: Gracelyn Nurse, MD   Recommendations at discharge:    follow-up with PCP in 1 to 2 weeks. Continue dressing change on left leg as per instruction  Discharge Diagnoses: Principal Problem:   Cellulitis and abscess of left lower extremity Active Problems:   Nausea & vomiting   Type 2 diabetes mellitus without complications (HCC)   AKI (acute kidney injury) (HCC)   Essential hypertension   Schizophrenia, paranoid, chronic (HCC)   Hypothyroidism   Dyslipidemia   Iron deficiency anemia    Derrick Atkins is a 52 y.o. male with medical history significant of schizophrenia, hypertension, hyperlipidemia, diabetes mellitus, hypothyroidism, anemia, cerebral palsy, aggressive behavior, who presents with pain in left lower leg.   Patient was recently hospitalized from 6/3 - 6/13 due to left lower leg cellulitis with abscess.  Patient is s/p of I&D by Dr. Claudine Mouton on 6/10.  Patient was treated with vancomycin and Zosyn in ED. Wound culture was positive for MSSA.  Patient was discharged on Keflex. Pt reports increased pain and swelling in left lower leg in the past 2 days. He was started on Bactrim.  Since then, he has developed nausea and vomiting. P   Cellulitis and abscess of left lower extremity: pt is s/p I&D on 6/10. Finished course of Keflex, developing worsening pain, consistent with cellulitis. Could not tolerate Bactrim. Lactic acid is 2.0, but patient does not meets criteria for sepsis (WBC 9.7, HR 73 --> 97 --> 80s now, RR 18 and normal temperature). Pt has 2+ bilateral leg edema.    - wound care consult apprecaited - CT Tibia-fibula-- no collection of fluid to suggest abscess. Inflammatory changes suggestive of cellulitis. -- Patient recently was on Bactrim now discontinued Gatorade nausea vomiting.  He got a dose of IV cefazolin. Will change  and give another course of Keflex for seven days. \  Nausea & vomiting: Etiology is not clear could be intolerance to Bactrim. Lipase normal.   -f/u CT-abd/pelvis --> no bowel obstruction, showed constipation.  Patient is 7.6 today, stable.  Hemoglobin MiraLAX and senna code -- patient tolerating PO diet well.   Diet controled type 2 diabetes mellitus without complications (HCC): Recent A1c 4.7 on 10/13/2022.  Well-controlled.  Patient not taking medications currently.  CBG 100 -No treatment needed now   AKI (acute kidney injury) (HCC): Likely due to dehydration and Bactrim use -Avoid using renal toxic medications. -Patient received 1 L normal saline bolus   Essential hypertension: Blood pressure 131/78.  Patient is not taking medications currently -IV hydralazine as needed   Schizophrenia, paranoid, chronic (HCC) -Continue home medications  Hypothyroidism -Synthroid   Dyslipidemia --cont gemfibrozil and crestor   Iron deficiency anemia: stable HH. Hemoglobin 7.6 on 12/23/2022 --> 7.6 today -Continue home iron supplement  Patient overall feels better. Recommended keep his leg elevated. Dressing change done. Patient is eager to go back to the facilities tolerating PO diet. I did leave a voicemail for patient's legal guardian Candace Gobble        DVT ppx: SQ Heparin    Code Status: Full code    Family Communication: Yes, left VM for patient's legal guardian, Ms. Goble   Diet recommendation:  Discharge Diet Orders (From admission, onward)     Start     Ordered   01/07/23 0000  Diet - low sodium heart healthy        01/07/23 1509            DISCHARGE MEDICATION: Allergies as of 01/07/2023       Reactions   Phenytoin Sodium Extended Other (See Comments), Nausea And Vomiting   Prednisone Hives, Nausea And Vomiting   Latex Hives, Nausea And Vomiting, Rash        Medication List     STOP taking these medications     fenofibrate 54 MG tablet   simvastatin 20 MG tablet Commonly known as: ZOCOR   sulfamethoxazole-trimethoprim 800-160 MG tablet Commonly known as: BACTRIM DS       TAKE these medications    Abilify Maintena 400 MG Prsy prefilled syringe Generic drug: ARIPiprazole ER Inject 400 mg into the muscle every 28 (twenty-eight) days.   acetaminophen 500 MG tablet Commonly known as: TYLENOL Take 500 mg by mouth 3 (three) times daily as needed.   albuterol 108 (90 Base) MCG/ACT inhaler Commonly known as: VENTOLIN HFA Inhale 1-2 puffs into the lungs every 4 (four) hours as needed.   ascorbic acid 500 MG tablet Commonly known as: VITAMIN C Take 1 tablet (500 mg total) by mouth daily.   aspirin EC 81 MG tablet Take 1 tablet (81 mg total) by mouth daily. Swallow whole.   benztropine 1 MG tablet Commonly known as: COGENTIN Take 1 tablet (1 mg total) by mouth daily. What changed: when to take this   busPIRone 15 MG tablet Commonly known as: BUSPAR Take 15 mg by mouth 3 (three) times daily.   cephALEXin 500 MG capsule Commonly known as: KEFLEX Take 1 capsule (500 mg total) by mouth every 8 (eight) hours for 7 days. Start taking on: January 08, 2023 What changed: when to take this   cholecalciferol 25 MCG (1000 UNIT) tablet Commonly known as: VITAMIN D3 Take 1,000 Units by mouth daily.   divalproex 250 MG DR tablet Commonly known as: DEPAKOTE Take 3 tablets (750 mg total) by mouth every 12 (twelve) hours.   folic acid 1 MG tablet Commonly known as: FOLVITE Take 1 tablet (1 mg total) by mouth daily.   gemfibrozil 600 MG tablet Commonly known as: LOPID Take 1 tablet (600 mg total) by mouth 2 (two) times daily.   hydrOXYzine 50 MG tablet Commonly known as: ATARAX Take 1 tablet (50 mg total) by mouth every 4 (four) hours as needed for anxiety.   iron polysaccharides 150 MG capsule Commonly known as: NIFEREX Take 1 capsule (150 mg total) by mouth daily.   levothyroxine  88 MCG tablet Commonly known as: SYNTHROID Take 1 tablet (88 mcg total) by mouth daily at 6 (six) AM.   OLANZapine 10 MG tablet Commonly known as: ZYPREXA Take 10 mg by mouth at bedtime.   OLANZapine 20 MG tablet Commonly known as: ZYPREXA Take 1 tablet (20 mg total) by mouth at bedtime.   polyethylene glycol 17 g packet Commonly known as: MIRALAX / GLYCOLAX Take 17 g by mouth daily.   rosuvastatin 5 MG tablet Commonly known as: CRESTOR Take 1 tablet (5 mg total) by mouth at bedtime.   senna 8.6 MG Tabs tablet Commonly known as: SENOKOT Take 2 tablets by mouth at bedtime.               Discharge Care Instructions  (From admission, onward)           Start     Ordered   01/07/23 0000  Discharge wound care:       Comments: 01/06/23 1611    Wound care  Daily      Comments: Wound care to left LE: Wash LE with soap and water, rinse and gently dry. Cover open areas with size appropriate pieces of silver hydrofiber (Aquacel Ag+ Advantage, [employer] [place] # [id number]). Top with dry gauze or ABD pads and secure with Kerlix roll gauze applied from just below toes to just below knee, secure with paper tape. Place no tape on skin. Top Kerlix roll gauze with ACE bandage applied in a similar manner. Change daily   01/07/23 1509            Follow-up Information     Gracelyn Nurse, MD. Schedule an appointment as soon as possible for a visit in 1 week(s).   Specialty: Internal Medicine Contact information: [address] [place] [address] [phone]                Discharge Exam: Ceasar Mons Weights   01/06/23 0865 01/06/23 0928  Weight: 86.2 kg 86.2 kg    Condition at discharge: fair  The results of significant diagnostics from this hospitalization (including imaging, microbiology, ancillary and laboratory) are listed below for reference.   Imaging Studies: US Venous Img Lower Bilateral (DVT)  Result Date: 01/07/2023 CLINICAL DATA:  Bilateral lower  extremity pain edema. Recent left leg (12/20/2022). Evaluate for DVT. EXAM: BILATERAL LOWER EXTREMITY VENOUS DOPPLER ULTRASOUND TECHNIQUE: Gray-scale sonography with graded compression, as well as color Doppler and duplex ultrasound were performed to evaluate the lower extremity deep venous systems from the level of the common femoral vein and including the common femoral, femoral, profunda femoral, popliteal and calf veins including the posterior tibial, peroneal and gastrocnemius veins when visible. The superficial great saphenous vein was also interrogated. Spectral Doppler was utilized to evaluate flow at rest and with distal augmentation maneuvers in the common femoral, femoral and popliteal veins. COMPARISON:  None Available. FINDINGS: RIGHT LOWER EXTREMITY Common Femoral Vein: No evidence of thrombus. Normal compressibility, respiratory phasicity and response to augmentation. Saphenofemoral Junction: No evidence of thrombus. Normal compressibility and flow on color Doppler imaging. Profunda Femoral Vein: No evidence of thrombus. Normal compressibility and flow on color Doppler imaging. Femoral Vein: No evidence of thrombus. Normal compressibility, respiratory phasicity and response to augmentation. Popliteal Vein: No evidence of thrombus. Normal compressibility, respiratory phasicity and response to augmentation. Calf Veins: No evidence of thrombus. Normal compressibility and flow on color Doppler imaging. Superficial Great Saphenous Vein: No evidence of thrombus. Normal compressibility. Other Findings: Note is made of a benign-appearing right inguinal lymph node, presumably reactive in etiology. LEFT LOWER EXTREMITY Common Femoral Vein: No evidence of thrombus. Normal compressibility, respiratory phasicity and response to augmentation. Saphenofemoral Junction: No evidence of thrombus. Normal compressibility and flow on color Doppler imaging. Profunda Femoral Vein: No evidence of thrombus. Normal  compressibility and flow on color Doppler imaging. Femoral Vein: No evidence of thrombus. Normal compressibility, respiratory phasicity and response to augmentation. Popliteal Vein: No evidence of thrombus. Normal compressibility, respiratory phasicity and response to augmentation. Calf Veins: No evidence of thrombus. Normal compressibility and flow on color Doppler imaging. Superficial Great Saphenous Vein: No evidence of thrombus. Normal compressibility. Other Findings: There is a minimal amount of subcutaneous edema at the level of the right lower leg and calf. Note is made of a benign-appearing non pathologically enlarged left inguinal lymph node, presumably reactive in etiology. IMPRESSION: No evidence of DVT within either lower extremity. Electronically  Signed   By: Simonne Come M.D.   On: 01/07/2023 10:21   CT TIBIA FIBULA LEFT WO CONTRAST  Result Date: 01/06/2023 CLINICAL DATA:  Pain and swelling of the left lower extremity EXAM: CT OF THE LOWER LEFT EXTREMITY WITHOUT CONTRAST TECHNIQUE: Multidetector CT imaging of the lower left extremity was performed according to the standard protocol. RADIATION DOSE REDUCTION: This exam was performed according to the departmental dose-optimization program which includes automated exposure control, adjustment of the mA and/or kV according to patient size and/or use of iterative reconstruction technique. COMPARISON:  Prior study 12/20/2022 FINDINGS: Persistent severe changes of cellulitis involving the left lower extremity with skin thickening, subcutaneous inflammation/edema, open wounds and skin blisters. No discrete fluid collection to suggest a drainable soft tissue abscess. No findings suspicious for septic arthritis at the knee or ankle joints. No findings to suggest osteomyelitis. IMPRESSION: 1. Persistent severe changes of cellulitis involving the left lower extremity with skin thickening, subcutaneous inflammation/edema, open wounds and skin blisters. 2. No  discrete fluid collection to suggest a drainable soft tissue abscess. 3. No findings suspicious for septic arthritis at the knee or ankle joints. 4. No findings to suggest osteomyelitis. Electronically Signed   By: Rudie Meyer M.D.   On: 01/06/2023 16:41   CT ABDOMEN PELVIS WO CONTRAST  Result Date: 01/06/2023 CLINICAL DATA:  Nausea and vomiting EXAM: CT ABDOMEN AND PELVIS WITHOUT CONTRAST TECHNIQUE: Multidetector CT imaging of the abdomen and pelvis was performed following the standard protocol without IV contrast. Unenhanced CT was performed per clinician order. Lack of IV contrast limits sensitivity and specificity, especially for evaluation of abdominal/pelvic solid viscera. RADIATION DOSE REDUCTION: This exam was performed according to the departmental dose-optimization program which includes automated exposure control, adjustment of the mA and/or kV according to patient size and/or use of iterative reconstruction technique. COMPARISON:  05/13/2020 FINDINGS: Lower chest: No acute pleural or parenchymal lung disease. Hepatobiliary: High density material layering in the gallbladder consistent with sludge. No calcified gallstones or cholecystitis. Unremarkable unenhanced appearance of the liver. Pancreas: Unremarkable unenhanced appearance. Spleen: Unremarkable unenhanced appearance. Adrenals/Urinary Tract: Punctate less than 2 mm nonobstructing right renal calculus. No left-sided calculi. No obstructive uropathy within either kidney. The adrenals and bladder are grossly unremarkable. Stomach/Bowel: No bowel obstruction or ileus. Moderate retained stool within the colon consistent with constipation. Postsurgical changes from partial right colon resection and ileocolic reanastomosis. No bowel wall thickening or inflammatory change. Vascular/Lymphatic: Aortic atherosclerosis. No enlarged abdominal or pelvic lymph nodes. Reproductive: Prostate is not enlarged. Peripheral calcifications are noted. Other: No free  fluid or free intraperitoneal gas. No abdominal wall hernia. Musculoskeletal: No acute or destructive bony abnormalities. Reconstructed images demonstrate no additional findings. IMPRESSION: 1. Moderate retained stool throughout the colon consistent with constipation. No obstruction or ileus. 2. Punctate nonobstructing 2 mm right renal calculus. 3. Gallbladder sludge without evidence of cholecystitis or cholelithiasis. 4.  Aortic Atherosclerosis (ICD10-I70.0). Electronically Signed   By: Sharlet Salina M.D.   On: 01/06/2023 16:29   DG Tibia/Fibula Left  Result Date: 01/06/2023 CLINICAL DATA:  Pain and swelling EXAM: LEFT TIBIA AND FIBULA - 2 VIEW COMPARISON:  Previous studies including the examination of 01/05/2023 FINDINGS: No fracture or dislocation is seen. No focal lytic lesions are seen. There is no periosteal new bone formation. There is diffuse edema in subcutaneous plane. There are faint lucencies in the anterior aspect of left lower leg suggesting possible open wounds in the skin. There are few smooth marginated soft tissue nodular densities  measuring up to 1.4 cm in diameter in the anterior aspect of left lower leg. Significance of this finding is not clear. There are no opaque foreign bodies. IMPRESSION: No fracture or dislocation is seen. There are no focal lytic lesions in left tibia and fibula. There is soft tissue swelling in left lower leg with interval worsening. No opaque foreign bodies are seen. Electronically Signed   By: Ernie Avena M.D.   On: 01/06/2023 11:53   DG Tibia/Fibula Left  Result Date: 01/05/2023 CLINICAL DATA:  Worsening chronic ulcer inferior left lower foreleg. Patient unable to assist with positioning. Best possible films obtained. EXAM: LEFT TIBIA AND FIBULA - 2 VIEW COMPARISON:  CT left tibia fibula with contrast 12/20/2022 FINDINGS: There is no evidence of fracture or other focal bone lesions. No findings suspicious for acute osteomyelitis or other primary  pathologic process. As seen previously there is diffuse edema in the foreleg. On the third image, there is a faint lucency in the anterior soft tissues in the distal foreleg which probably corresponds to the draining subcutaneous abscess noted on CT, measuring 2.3 cm in height. Another faint subcutaneous lucency 3-5 cm above this level is also noted and could indicate a second abscess pocket, measuring 2.1 cm height. The more inferior collection did appear to extend up to this level on the CT. No other focal abnormality is evident.  No foreign body is seen. There is mild degenerative arthrosis of the medial femorotibial joint and the tibiotalar joint. IMPRESSION: 1. No findings suspicious for acute osteomyelitis or other primary pathologic process. 2. Diffuse edema in the foreleg. 3. Faint lucencies in the anterior soft tissues in the distal foreleg, probably corresponding to the draining subcutaneous abscess noted on CT. 4. 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 Electronically Signed   By: Almira Bar M.D.   On: 01/05/2023 20:10   CT Head Wo Contrast  Result Date: 01/05/2023 CLINICAL DATA:  Hypoglycemia, confusion EXAM: CT HEAD WITHOUT CONTRAST TECHNIQUE: Contiguous axial images were obtained from the base of the skull through the vertex without intravenous contrast. RADIATION DOSE REDUCTION: This exam was performed according to the departmental dose-optimization program which includes automated exposure control, adjustment of the mA and/or kV according to patient size and/or use of iterative reconstruction technique. COMPARISON:  01/18/2022 FINDINGS: Brain: No evidence of acute infarction, hemorrhage, mass, mass effect, or midline shift. No hydrocephalus or extra-axial fluid collection. Somewhat advanced cerebral atrophy for age. Vascular: No hyperdense vessel. Atherosclerotic calcifications in the intracranial carotid and vertebral arteries. Skull: Negative for fracture or focal lesion.  Sinuses/Orbits: Mucosal thickening in the ethmoid air cells. Status post right lens replacement. Other: The mastoid air cells are well aerated. IMPRESSION: No acute intracranial process. Electronically Signed   By: Wiliam Ke M.D.   On: 01/05/2023 18:15   CT TIBIA FIBULA LEFT W CONTRAST  Result Date: 12/21/2022 CLINICAL DATA:  Soft tissue infection suspected, lower leg, xray done EXAM: CT OF THE LOWER LEFT EXTREMITY WITH CONTRAST TECHNIQUE: Multidetector CT imaging of the lower left extremity was performed according to the standard protocol following intravenous contrast administration. RADIATION DOSE REDUCTION: This exam was performed according to the departmental dose-optimization program which includes automated exposure control, adjustment of the mA and/or kV according to patient size and/or use of iterative reconstruction technique. CONTRAST:  OMNIPAQUE IOHEXOL 300 MG/ML  SOLN COMPARISON:  12/16/2022 CT FINDINGS: Bones/Joint/Cartilage No acute fracture. No dislocation. No erosion or periosteal elevation. Similar degenerative changes at the knee, ankle,  and hindfoot. No significant effusion of the knee, ankle, or subtalar joints. Ligaments Suboptimally assessed by CT. Muscles and Tendons No acute muscular abnormality by CT. No intramuscular fluid collections are evident. Focal tenosynovitis of the tibialis anterior tendon just above the level of the ankle joint (series 5, image 737). Remaining tendinous structures are otherwise within normal limits. Soft tissues Diffuse subcutaneous edema throughout the lower leg is more pronounced at the anterior aspect of the lower leg where there is a developing fluid collection with partial rim enhancement measuring approximately 8.0 x 1.1 x 5.3 cm (series 5, image 657; series 10, image 76). There appears to be overlying skin wound or ulceration. No additional fluid collections. No deep fascial fluid. No soft tissue gas. IMPRESSION: 1. Diffuse subcutaneous edema  throughout the lower leg is more pronounced at the anterior aspect of the lower leg where there is a developing fluid collection with partial rim enhancement measuring approximately 8.0 x 1.1 x 5.3 cm. There appears to be overlying skin wound or ulceration. Findings are most compatible with phlegmon/developing abscess. 2. Focal tenosynovitis of the tibialis anterior tendon just above the level of the ankle joint. 3. No CT evidence of osteomyelitis or septic arthritis. Electronically Signed   By: Duanne Guess D.O.   On: 12/21/2022 09:33   CT TIBIA FIBULA LEFT W CONTRAST  Result Date: 12/17/2022 CLINICAL DATA:  Soft tissue infection suspected. Evaluate for abscess. EXAM: CT OF THE LOWER LEFT EXTREMITY WITH CONTRAST TECHNIQUE: Multidetector CT imaging of the lower left extremity was performed according to the standard protocol following intravenous contrast administration. RADIATION DOSE REDUCTION: This exam was performed according to the departmental dose-optimization program which includes automated exposure control, adjustment of the mA and/or kV according to patient size and/or use of iterative reconstruction technique. CONTRAST:  OMNIPAQUE IOHEXOL 300 MG/ML  SOLN COMPARISON:  Left tibia and fibula radiographs 12/13/2022 FINDINGS: Bones/Joint/Cartilage O the cortices are intact. No acute fracture. Tiny plantar and posterior calcaneal heel spurs. There is mild likely degenerative subchondral cystic change within the anterolateral aspect of the tibial plafond (coronal series 6, image 78). Ligaments Suboptimally assessed by CT. Muscles and Tendons Normal size and density of the regional musculature. No gross tendon tear is seen. Soft tissues There is moderate diffuse subcutaneous fat edema and swelling throughout the visualized calf from the knee to the ankle. Within the distal posterior calf there is some coalescing a fluid at the deep aspect of the subcutaneous fat, measuring up to 9 mm in AP dimension  (sagittal series 9, image 44 and axial series 4, image 158). Within the distal anterior calf there is similar mild coalescing of fluid measuring up to 6 mm in AP dimension (sagittal series 9, image 41 and axial series 4, image 618). No walled-off abscess is identified. No knee joint effusion. IMPRESSION: 1. Moderate diffuse subcutaneous fat edema and swelling throughout the visualized calf from the knee to the ankle. 2. No walled-off abscess is identified. 3. No acute osseous abnormality. 4. Mild likely degenerative subchondral cystic change within the anterolateral aspect of the tibial plafond. Electronically Signed   By: Neita Garnet M.D.   On: 12/17/2022 08:45   US Venous Img Lower Unilateral Left  Result Date: 12/13/2022 CLINICAL DATA:  Left lower extremity pain, redness, and swelling EXAM: LEFT LOWER EXTREMITY VENOUS DOPPLER ULTRASOUND TECHNIQUE: Gray-scale sonography with compression, as well as color and duplex ultrasound, were performed to evaluate the deep venous system(s) from the level of the common femoral vein  through the popliteal and proximal calf veins. COMPARISON:  None Available. FINDINGS: VENOUS Normal compressibility of the common femoral, superficial femoral, and popliteal veins, as well as the visualized calf veins. Visualized portions of profunda femoral vein and great saphenous vein unremarkable. No filling defects to suggest DVT on grayscale or color Doppler imaging. Doppler waveforms show normal direction of venous flow, normal respiratory plasticity and response to augmentation. Limited views of the contralateral common femoral vein are unremarkable. OTHER Subcutaneous soft tissue swelling overlying the popliteal region and calf. Limitations: none IMPRESSION: Negative. Electronically Signed   By: Agustin Cree M.D.   On: 12/13/2022 16:38   DG Tibia/Fibula Left  Result Date: 12/13/2022 CLINICAL DATA:  Pain EXAM: LEFT TIBIA AND FIBULA - 2 VIEW COMPARISON:  None Available. FINDINGS: No  fracture or dislocation is seen. There are no focal lytic lesions. There is no effusion in the left knee. There is edema in subcutaneous plane. IMPRESSION: No radiographic abnormality is seen in bony structures in left lower leg. There is diffuse edema in subcutaneous plane. Electronically Signed   By: Ernie Avena M.D.   On: 12/13/2022 15:06    Microbiology: Results for orders placed or performed during the hospital encounter of 01/06/23  Culture, blood (routine x 2)     Status: None (Preliminary result)   Collection Time: 01/06/23 12:27 PM   Specimen: BLOOD  Result Value Ref Range Status   Specimen Description BLOOD RIGHT ANTECUBITAL  Final   Special Requests NONE  Final   Culture   Final    NO GROWTH < 24 HOURS Performed at [facility], [address] [place], [place] [address]    Report Status PENDING  Incomplete  Culture, blood (routine x 2)     Status: None (Preliminary result)   Collection Time: 01/06/23 12:33 PM   Specimen: BLOOD  Result Value Ref Range Status   Specimen Description BLOOD LEFT ANTECUBITAL  Final   Special Requests   Final    BOTTLES DRAWN AEROBIC AND ANAEROBIC Blood Culture adequate volume   Culture   Final    NO GROWTH < 24 HOURS Performed at [facility], [address] [place], [place] [address]    Report Status PENDING  Incomplete    Labs: CBC: Recent Labs  Lab 01/06/23 1227 01/07/23 0508  WBC 3.1* 3.0*  NEUTROABS 1.5*  --   HGB 8.9* 8.9*  HCT 27.5* 28.4*  MCV 88.4 90.2  PLT 105* 83*   Basic Metabolic Panel: Recent Labs  Lab 01/05/23 1742 01/06/23 0929 01/07/23 0508  NA 138 135 139  K 3.8 4.0 4.1  CL 110 109 110  CO2 19* 18* 22  GLUCOSE 99 80 77  BUN 23* 20 16  CREATININE 1.85* 1.65* 1.39*  CALCIUM 8.5* 8.7* 8.3*   Liver Function Tests: Recent Labs  Lab 01/05/23 1742 01/06/23 0929  AST 41 48*  ALT 14 17  ALKPHOS 57 60  BILITOT 1.2 1.2  PROT 6.4* 6.2*  ALBUMIN 3.1* 3.1*   CBG: Recent Labs   Lab 01/05/23 1735  GLUCAP 100*    Discharge time spent: greater than 30 minutes.  Signed: Enedina Finner, MD [facility] 01/07/2023

## 2023-01-07 NOTE — Progress Notes (Signed)
Patient is disoriented to time. Day nurse reported that patient had been lethargic. He did eat dinner but became very sleepy afterwards and was unable to swallow meds due to inability to stay awake. He would respond to voice then fall right back asleep. Pupils are dilated to about a 7-8 mm. No pain indicators present. He has improved in mentation this morning. He called the nurse's station to request some crackers and a drink. Went into room and gave morning meds. He stated that he must've been really tired last night because he slept deeply. Took mneds without an issue. He denied pain and additional needs. Eyes remain dilated. Denied any additional needs.

## 2023-01-09 LAB — CULTURE, BLOOD (ROUTINE X 2)

## 2023-01-10 LAB — CULTURE, BLOOD (ROUTINE X 2): Special Requests: ADEQUATE

## 2023-01-11 ENCOUNTER — Encounter: Payer: Self-pay | Admitting: Physician Assistant

## 2023-01-11 ENCOUNTER — Ambulatory Visit (INDEPENDENT_AMBULATORY_CARE_PROVIDER_SITE_OTHER): Payer: 59 | Admitting: Physician Assistant

## 2023-01-11 VITALS — BP 119/71 | HR 88 | Temp 98.9°F | Ht 75.0 in | Wt 208.0 lb

## 2023-01-11 DIAGNOSIS — Z9889 Other specified postprocedural states: Secondary | ICD-10-CM

## 2023-01-11 DIAGNOSIS — L02416 Cutaneous abscess of left lower limb: Secondary | ICD-10-CM

## 2023-01-11 DIAGNOSIS — Z09 Encounter for follow-up examination after completed treatment for conditions other than malignant neoplasm: Secondary | ICD-10-CM

## 2023-01-11 LAB — CULTURE, BLOOD (ROUTINE X 2): Culture: NO GROWTH

## 2023-01-11 NOTE — Progress Notes (Signed)
[facility] POST-OP OFFICE VISIT  01/11/2023  HPI: Derrick Atkins is a 52 y.o. male 22 days s/p incision and drainage with excisional debridement of complex distal LLE abscess   Patient did have a recent admission to the hospital from 06/27 - 06/28 secondary to concerns over cellulitis and AKI. Reviewed work up in the ED to include normal WBC to 3.1K, Hgb 8.9, sCr - 1.85 (1.39 at DC), CT Left Tib/Fib which showed cellulitis without abscess, and Venous US without evidence of DVT. He was admitted to medicine. Discharge home on Keflex.   He presents again to clinic today due to complaints of not feeling good. He is overall a difficult historian given his psychiatric history. He is unable to illicit how long this has been going on for and states "no one at the home believes me." He continues to have quite significant edema to the bilateral lower extremities, now right > left, with venous stasis changes. His previous I&D wounds are healing appropriately. He denied any fever, chills, cough, CP, SOB, or urinary changes. I did discuss with group home member at bedside and he was previously on diuretic but this was stopped in the hospital due to AKI at the time.    Vital signs: BP 119/71   Pulse 88   Temp 98.9 F (37.2 C)   Ht 6\' 3"  (1.905 m)   Wt 208 lb (94.3 kg)   SpO2 97%   BMI 26.00 kg/m    Physical Exam: Constitutional: Well appearing male, NAD Pulmonary: Lungs are CTAB in all fields; normal effort, no respiratory distress Cardiac: HRRR, no murmurs  MSK: He has significant 2+ pitting edema to the RLE; 1+ to the LLE. There are venous stasis changes bilaterally. No calf pain, negative Homan's Sign  Skin: I&D incisions the LLE are healing expectedly, anticipate delayed healing given significant edema, I do not think there is active infection   Assessment/Plan: This is a 52 y.o. male 22 days s/p incision and drainage with excisional debridement of complex distal LLE abscess     - Presentation today secondary to generalized fatigue and significant edema to the lower extremities, worse on the right. I do not think there is any active infection, undrained abscess, or surgical etiology today. His wounds are healing appropriately; I do anticipate slow healing given his edema. Reasonable to complete most recent course of Abx. Continue elevation of extremities and compression wrapping. I do think he needs more urgently evaluation by his PCP and that is the more appropriate provider for this issue. Suspect he will need to go back on diuretic as long as kidney function can allows. Group home member at bedside working on getting an appointment. I do not think he needs to go to the ED as he is having no other symptoms and is hemodynamically stable for outpatient management. He, and group home member at bedside, are understanding and in agreement. I do not think there is much else to offer from surgical perspective but I will be available as needed.  -- Lynden Oxford, PA-C [facility] 01/11/2023, 4:04 PM M-F: 7am - 4pm

## 2023-01-11 NOTE — Patient Instructions (Addendum)
You need to be seen by your primary care doctor for the edema in your lower legs.   You may need to be placed on something to pull the fluid from your legs.  We don't see any infection in you leg.   Keep your legs elevated as much as possible. Drink plenty of water to help your kidney function.

## 2023-01-18 ENCOUNTER — Encounter: Payer: 59 | Admitting: Physician Assistant

## 2023-02-21 ENCOUNTER — Encounter: Payer: Self-pay | Admitting: Emergency Medicine

## 2023-02-21 ENCOUNTER — Emergency Department: Payer: 59

## 2023-02-21 ENCOUNTER — Inpatient Hospital Stay
Admission: EM | Admit: 2023-02-21 | Discharge: 2023-02-28 | DRG: 603 | Disposition: A | Discharge status: Skilled Nursing Facility | Payer: 59 | Attending: Internal Medicine | Admitting: Internal Medicine

## 2023-02-21 ENCOUNTER — Other Ambulatory Visit: Payer: Self-pay

## 2023-02-21 DIAGNOSIS — T502X5A Adverse effect of carbonic-anhydrase inhibitors, benzothiadiazides and other diuretics, initial encounter: Secondary | ICD-10-CM | POA: Diagnosis present

## 2023-02-21 DIAGNOSIS — Z91040 Latex allergy status: Secondary | ICD-10-CM | POA: Diagnosis not present

## 2023-02-21 DIAGNOSIS — F200 Paranoid schizophrenia: Secondary | ICD-10-CM

## 2023-02-21 DIAGNOSIS — L03116 Cellulitis of left lower limb: Principal | ICD-10-CM

## 2023-02-21 DIAGNOSIS — E860 Dehydration: Secondary | ICD-10-CM | POA: Diagnosis present

## 2023-02-21 DIAGNOSIS — R9431 Abnormal electrocardiogram [ECG] [EKG]: Secondary | ICD-10-CM | POA: Diagnosis present

## 2023-02-21 DIAGNOSIS — E119 Type 2 diabetes mellitus without complications: Secondary | ICD-10-CM

## 2023-02-21 DIAGNOSIS — I10 Essential (primary) hypertension: Secondary | ICD-10-CM | POA: Diagnosis present

## 2023-02-21 DIAGNOSIS — Z8249 Family history of ischemic heart disease and other diseases of the circulatory system: Secondary | ICD-10-CM | POA: Diagnosis not present

## 2023-02-21 DIAGNOSIS — T796XXA Traumatic ischemia of muscle, initial encounter: Secondary | ICD-10-CM | POA: Diagnosis present

## 2023-02-21 DIAGNOSIS — Z7982 Long term (current) use of aspirin: Secondary | ICD-10-CM | POA: Diagnosis not present

## 2023-02-21 DIAGNOSIS — R262 Difficulty in walking, not elsewhere classified: Secondary | ICD-10-CM | POA: Diagnosis present

## 2023-02-21 DIAGNOSIS — N179 Acute kidney failure, unspecified: Secondary | ICD-10-CM

## 2023-02-21 DIAGNOSIS — E11649 Type 2 diabetes mellitus with hypoglycemia without coma: Secondary | ICD-10-CM | POA: Diagnosis present

## 2023-02-21 DIAGNOSIS — D6959 Other secondary thrombocytopenia: Secondary | ICD-10-CM | POA: Diagnosis present

## 2023-02-21 DIAGNOSIS — E785 Hyperlipidemia, unspecified: Secondary | ICD-10-CM | POA: Diagnosis present

## 2023-02-21 DIAGNOSIS — Z5982 Transportation insecurity: Secondary | ICD-10-CM | POA: Diagnosis not present

## 2023-02-21 DIAGNOSIS — E162 Hypoglycemia, unspecified: Secondary | ICD-10-CM | POA: Diagnosis not present

## 2023-02-21 DIAGNOSIS — D696 Thrombocytopenia, unspecified: Secondary | ICD-10-CM | POA: Diagnosis not present

## 2023-02-21 DIAGNOSIS — F209 Schizophrenia, unspecified: Secondary | ICD-10-CM | POA: Diagnosis present

## 2023-02-21 DIAGNOSIS — Z79890 Hormone replacement therapy (postmenopausal): Secondary | ICD-10-CM | POA: Diagnosis not present

## 2023-02-21 DIAGNOSIS — G809 Cerebral palsy, unspecified: Secondary | ICD-10-CM | POA: Diagnosis present

## 2023-02-21 DIAGNOSIS — D649 Anemia, unspecified: Secondary | ICD-10-CM | POA: Insufficient documentation

## 2023-02-21 DIAGNOSIS — Z79899 Other long term (current) drug therapy: Secondary | ICD-10-CM | POA: Diagnosis not present

## 2023-02-21 DIAGNOSIS — W19XXXA Unspecified fall, initial encounter: Secondary | ICD-10-CM

## 2023-02-21 DIAGNOSIS — Y92009 Unspecified place in unspecified non-institutional (private) residence as the place of occurrence of the external cause: Secondary | ICD-10-CM

## 2023-02-21 DIAGNOSIS — R7989 Other specified abnormal findings of blood chemistry: Secondary | ICD-10-CM | POA: Diagnosis present

## 2023-02-21 DIAGNOSIS — M6282 Rhabdomyolysis: Secondary | ICD-10-CM | POA: Diagnosis present

## 2023-02-21 DIAGNOSIS — E039 Hypothyroidism, unspecified: Secondary | ICD-10-CM

## 2023-02-21 DIAGNOSIS — D509 Iron deficiency anemia, unspecified: Secondary | ICD-10-CM | POA: Diagnosis present

## 2023-02-21 DIAGNOSIS — Z888 Allergy status to other drugs, medicaments and biological substances status: Secondary | ICD-10-CM

## 2023-02-21 LAB — CBC
HCT: 30.3 % — ABNORMAL LOW (ref 39.0–52.0)
Hemoglobin: 10.5 g/dL — ABNORMAL LOW (ref 13.0–17.0)
MCH: 29.2 pg (ref 26.0–34.0)
MCHC: 34.7 g/dL (ref 30.0–36.0)
MCV: 84.2 fL (ref 80.0–100.0)
Platelets: 69 10*3/uL — ABNORMAL LOW (ref 150–400)
RBC: 3.6 MIL/uL — ABNORMAL LOW (ref 4.22–5.81)
RDW: 13.2 % (ref 11.5–15.5)
WBC: 5.9 10*3/uL (ref 4.0–10.5)
nRBC: 0 % (ref 0.0–0.2)

## 2023-02-21 LAB — COMPREHENSIVE METABOLIC PANEL
ALT: 69 U/L — ABNORMAL HIGH (ref 0–44)
AST: 325 U/L — ABNORMAL HIGH (ref 15–41)
Albumin: 3.6 g/dL (ref 3.5–5.0)
Alkaline Phosphatase: 57 U/L (ref 38–126)
Anion gap: 13 (ref 5–15)
BUN: 42 mg/dL — ABNORMAL HIGH (ref 6–20)
CO2: 27 mmol/L (ref 22–32)
Calcium: 8.9 mg/dL (ref 8.9–10.3)
Chloride: 96 mmol/L — ABNORMAL LOW (ref 98–111)
Creatinine, Ser: 2.2 mg/dL — ABNORMAL HIGH (ref 0.61–1.24)
GFR, Estimated: 36 mL/min — ABNORMAL LOW (ref >60)
Glucose, Bld: 73 mg/dL (ref 70–99)
Potassium: 3.6 mmol/L (ref 3.5–5.1)
Sodium: 136 mmol/L (ref 135–145)
Total Bilirubin: 1.3 mg/dL — ABNORMAL HIGH (ref 0.3–1.2)
Total Protein: 6.4 g/dL — ABNORMAL LOW (ref 6.5–8.1)

## 2023-02-21 LAB — CBG MONITORING, ED
Glucose-Capillary: 59 mg/dL — ABNORMAL LOW (ref 70–99)
Glucose-Capillary: 72 mg/dL (ref 70–99)
Glucose-Capillary: 69 mg/dL — ABNORMAL LOW (ref 70–99)
Glucose-Capillary: 79 mg/dL (ref 70–99)

## 2023-02-21 LAB — LACTIC ACID, PLASMA
Lactic Acid, Venous: 1.3 mmol/L (ref 0.5–1.9)
Lactic Acid, Venous: 1.9 mmol/L (ref 0.5–1.9)

## 2023-02-21 LAB — URINALYSIS, W/ REFLEX TO CULTURE (INFECTION SUSPECTED)
Bacteria, UA: NONE SEEN
Bilirubin Urine: NEGATIVE
Glucose, UA: NEGATIVE mg/dL
Hgb urine dipstick: NEGATIVE
Ketones, ur: NEGATIVE mg/dL
Leukocytes,Ua: NEGATIVE
Nitrite: NEGATIVE
Protein, ur: NEGATIVE mg/dL
Specific Gravity, Urine: 1.012 (ref 1.005–1.030)
Squamous Epithelial / HPF: NONE SEEN /HPF (ref 0–5)
WBC, UA: NONE SEEN WBC/hpf (ref 0–5)
pH: 5 (ref 5.0–8.0)

## 2023-02-21 LAB — MAGNESIUM: Magnesium: 2.3 mg/dL (ref 1.7–2.4)

## 2023-02-21 LAB — URINE DRUG SCREEN, QUALITATIVE (ARMC ONLY)
Amphetamines, Ur Screen: NOT DETECTED
Barbiturates, Ur Screen: NOT DETECTED
Benzodiazepine, Ur Scrn: NOT DETECTED
Cannabinoid 50 Ng, Ur ~~LOC~~: NOT DETECTED
Cocaine Metabolite,Ur ~~LOC~~: NOT DETECTED
MDMA (Ecstasy)Ur Screen: NOT DETECTED
Methadone Scn, Ur: NOT DETECTED
Opiate, Ur Screen: NOT DETECTED
Phencyclidine (PCP) Ur S: NOT DETECTED
Tricyclic, Ur Screen: POSITIVE — AB

## 2023-02-21 LAB — SEDIMENTATION RATE: Sed Rate: 5 mm/hr (ref 0–20)

## 2023-02-21 LAB — VALPROIC ACID LEVEL: Valproic Acid Lvl: 105 ug/mL — ABNORMAL HIGH (ref 50.0–100.0)

## 2023-02-21 LAB — ETHANOL: Alcohol, Ethyl (B): <10 mg/dL (ref <10)

## 2023-02-21 LAB — ACETAMINOPHEN LEVEL: Acetaminophen (Tylenol), Serum: <10 ug/mL — ABNORMAL LOW (ref 10–30)

## 2023-02-21 LAB — SALICYLATE LEVEL: Salicylate Lvl: <7 mg/dL — ABNORMAL LOW (ref 7.0–30.0)

## 2023-02-21 LAB — CK: Total CK: 8863 U/L — ABNORMAL HIGH (ref 49–397)

## 2023-02-21 LAB — GLUCOSE, CAPILLARY: Glucose-Capillary: 75 mg/dL (ref 70–99)

## 2023-02-21 MED ORDER — FENOFIBRATE 54 MG PO TABS
54.0000 mg | ORAL_TABLET | Freq: Every day | ORAL | Status: DC
  Administered 2023-02-22 – 2023-02-23 (×2): 54 mg via ORAL
  Filled 2023-02-21 (×3): qty 1

## 2023-02-21 MED ORDER — VITAMIN D 25 MCG (1000 UNIT) PO TABS
1000.0000 [IU] | ORAL_TABLET | Freq: Every day | ORAL | Status: DC
  Administered 2023-02-22 – 2023-02-28 (×7): 1000 [IU] via ORAL
  Filled 2023-02-21 (×7): qty 1

## 2023-02-21 MED ORDER — DEXTROSE 50 % IV SOLN
50.0000 mL | INTRAVENOUS | Status: DC | PRN
  Filled 2023-02-21: qty 50

## 2023-02-21 MED ORDER — BENZTROPINE MESYLATE 1 MG PO TABS
1.0000 mg | ORAL_TABLET | Freq: Two times a day (BID) | ORAL | Status: DC
  Administered 2023-02-21 – 2023-02-28 (×14): 1 mg via ORAL
  Filled 2023-02-21 (×14): qty 1

## 2023-02-21 MED ORDER — HYDROXYZINE HCL 50 MG PO TABS
50.0000 mg | ORAL_TABLET | Freq: Every day | ORAL | Status: DC | PRN
  Administered 2023-02-28: 50 mg via ORAL
  Filled 2023-02-21: qty 1

## 2023-02-21 MED ORDER — VANCOMYCIN VARIABLE DOSE PER UNSTABLE RENAL FUNCTION (PHARMACIST DOSING): Status: DC

## 2023-02-21 MED ORDER — SENNA 8.6 MG PO TABS
2.0000 | ORAL_TABLET | Freq: Every day | ORAL | Status: DC
  Administered 2023-02-21 – 2023-02-27 (×7): 17.2 mg via ORAL
  Filled 2023-02-21 (×7): qty 2

## 2023-02-21 MED ORDER — SODIUM CHLORIDE 0.9 % IV SOLN
2.0000 g | INTRAVENOUS | Status: DC
  Administered 2023-02-21 – 2023-02-24 (×4): 2 g via INTRAVENOUS
  Filled 2023-02-21 (×5): qty 20

## 2023-02-21 MED ORDER — VANCOMYCIN HCL 2000 MG/400ML IV SOLN
2000.0000 mg | Freq: Once | INTRAVENOUS | Status: AC
  Administered 2023-02-21: 2000 mg via INTRAVENOUS
  Filled 2023-02-21 (×2): qty 400

## 2023-02-21 MED ORDER — OXYCODONE HCL 5 MG PO TABS
5.0000 mg | ORAL_TABLET | Freq: Four times a day (QID) | ORAL | Status: DC | PRN
  Administered 2023-02-28: 5 mg via ORAL
  Filled 2023-02-21: qty 1

## 2023-02-21 MED ORDER — ASPIRIN 81 MG PO TBEC
81.0000 mg | DELAYED_RELEASE_TABLET | Freq: Every day | ORAL | Status: DC
  Administered 2023-02-22 – 2023-02-28 (×7): 81 mg via ORAL
  Filled 2023-02-21 (×7): qty 1

## 2023-02-21 MED ORDER — ROSUVASTATIN CALCIUM 5 MG PO TABS
5.0000 mg | ORAL_TABLET | Freq: Every day | ORAL | Status: DC
  Filled 2023-02-21: qty 1

## 2023-02-21 MED ORDER — ALBUTEROL SULFATE (2.5 MG/3ML) 0.083% IN NEBU: 2.5000 mg | INHALATION_SOLUTION | RESPIRATORY_TRACT | Status: DC | PRN

## 2023-02-21 MED ORDER — OLANZAPINE 10 MG PO TABS
20.0000 mg | ORAL_TABLET | Freq: Every day | ORAL | Status: DC
  Administered 2023-02-21 – 2023-02-22 (×2): 20 mg via ORAL
  Filled 2023-02-21 (×3): qty 2

## 2023-02-21 MED ORDER — DIPHENHYDRAMINE HCL 50 MG/ML IJ SOLN: 12.5000 mg | Freq: Three times a day (TID) | INTRAMUSCULAR | Status: DC | PRN

## 2023-02-21 MED ORDER — OLANZAPINE 10 MG PO TABS
10.0000 mg | ORAL_TABLET | Freq: Every day | ORAL | Status: DC
  Administered 2023-02-21 – 2023-02-22 (×2): 10 mg via ORAL
  Filled 2023-02-21 (×3): qty 1

## 2023-02-21 MED ORDER — SODIUM CHLORIDE 0.9 % IV BOLUS
500.0000 mL | Freq: Once | INTRAVENOUS | Status: AC
  Administered 2023-02-21: 500 mL via INTRAVENOUS

## 2023-02-21 MED ORDER — POLYSACCHARIDE IRON COMPLEX 150 MG PO CAPS
150.0000 mg | ORAL_CAPSULE | Freq: Every day | ORAL | Status: DC
  Administered 2023-02-22 – 2023-02-28 (×7): 150 mg via ORAL
  Filled 2023-02-21 (×7): qty 1

## 2023-02-21 MED ORDER — POLYVINYL ALCOHOL 1.4 % OP SOLN: 1.0000 [drp] | Freq: Three times a day (TID) | OPHTHALMIC | Status: DC | PRN

## 2023-02-21 MED ORDER — HYDRALAZINE HCL 20 MG/ML IJ SOLN: 5.0000 mg | INTRAMUSCULAR | Status: DC | PRN

## 2023-02-21 MED ORDER — VANCOMYCIN HCL 2000 MG/400ML IV SOLN
2000.0000 mg | Freq: Once | INTRAVENOUS | Status: DC
  Filled 2023-02-21: qty 400

## 2023-02-21 MED ORDER — LEVOTHYROXINE SODIUM 88 MCG PO TABS
88.0000 ug | ORAL_TABLET | Freq: Every day | ORAL | Status: DC
  Administered 2023-02-22 – 2023-02-28 (×7): 88 ug via ORAL
  Filled 2023-02-21 (×7): qty 1

## 2023-02-21 MED ORDER — LACTATED RINGERS IV BOLUS
1000.0000 mL | Freq: Once | INTRAVENOUS | Status: AC
  Administered 2023-02-21: 1000 mL via INTRAVENOUS

## 2023-02-21 MED ORDER — FOLIC ACID 1 MG PO TABS
1.0000 mg | ORAL_TABLET | Freq: Every day | ORAL | Status: DC
  Administered 2023-02-22 – 2023-02-28 (×7): 1 mg via ORAL
  Filled 2023-02-21 (×7): qty 1

## 2023-02-21 MED ORDER — VITAMIN C 500 MG PO TABS
500.0000 mg | ORAL_TABLET | Freq: Every day | ORAL | Status: DC
  Administered 2023-02-22 – 2023-02-28 (×7): 500 mg via ORAL
  Filled 2023-02-21 (×7): qty 1

## 2023-02-21 MED ORDER — POLYETHYLENE GLYCOL 3350 17 G PO PACK
17.0000 g | PACK | Freq: Every day | ORAL | Status: DC
  Administered 2023-02-23 – 2023-02-28 (×3): 17 g via ORAL
  Filled 2023-02-21 (×7): qty 1

## 2023-02-21 MED ORDER — SODIUM CHLORIDE 0.9 % IV SOLN: INTRAVENOUS | Status: DC

## 2023-02-21 NOTE — ED Notes (Signed)
Error in charting- patient not triaged by this RN. Pt triaged by Adonis Housekeeper, RN.

## 2023-02-21 NOTE — ED Notes (Signed)
MD informed of pt's CBG. Pt given chocolate milk and sandwich tray.

## 2023-02-21 NOTE — ED Notes (Signed)
First Nurse Note: Patient to ED via [facility] from group home. Patient brought in for bilateral leg pain. Currently receiving wound care on left leg. Also has right foot swelling. VS WNL with EMS

## 2023-02-21 NOTE — ED Provider Notes (Signed)
[facility] Provider Note    Event Date/Time   First MD Initiated Contact with Patient 02/21/23 1547     (approximate)   History   Chief Complaint: Toe Injury   HPI  Derrick Atkins is a 52 y.o. male with a history of hypertension, schizophrenia, diabetes, cerebral palsy who comes ED complaining of weakness in bilateral lower extremities resulting in fall today.  States that he has been feeling bad and having poor appetite.  Reports that his left lower extremity feels numb and his right lower extremity feels painful.  Lives in a group home.     Physical Exam   Triage Vital Signs: ED Triage Vitals  Encounter Vitals Group     BP 02/21/23 1423 131/75     Systolic BP Percentile --      Diastolic BP Percentile --      Pulse Rate 02/21/23 1423 (!) 101     Resp 02/21/23 1423 17     Temp 02/21/23 1423 97.7 F (36.5 C)     Temp Source 02/21/23 1423 Oral     SpO2 02/21/23 1423 100 %     Weight 02/21/23 1424 207 lb 14.3 oz (94.3 kg)     Height 02/21/23 1424 6\' 3"  (1.905 m)     Head Circumference --      Peak Flow --      Pain Score 02/21/23 1424 3     Pain Loc --      Pain Education --      Exclude from Growth Chart --     Most recent vital signs: Vitals:   02/21/23 1820 02/21/23 1830  BP: 97/73 117/75  Pulse: (!) 103 92  Resp: 19 20  Temp:    SpO2: 100% 100%    General: Awake, no distress.  CV:  Good peripheral perfusion.  Tachycardia heart rate 100 distal pulses, strong DP pulse bilaterally Resp:  Normal effort.  Clear to auscultation bilaterally. Abd:  No distention.  Soft nontender Other:  Dry oral mucosa.  Right lower extremity has 1+ edema and ecchymosis over the distal medial foot with tenderness.  Left lower extremity has normal foot, erythema extending from just below the knee to above the ankle with multiple scattered superficial wounds.  No crepitus or drainage.  The area is warm to the touch.   ED Results / Procedures /  Treatments   Labs (all labs ordered are listed, but only abnormal results are displayed) Labs Reviewed  COMPREHENSIVE METABOLIC PANEL - Abnormal; Notable for the following components:      Result Value   Chloride 96 (*)    BUN 42 (*)    Creatinine, Ser 2.20 (*)    Total Protein 6.4 (*)    AST 325 (*)    ALT 69 (*)    Total Bilirubin 1.3 (*)    GFR, Estimated 36 (*)    All other components within normal limits  CBC - Abnormal; Notable for the following components:   RBC 3.60 (*)    Hemoglobin 10.5 (*)    HCT 30.3 (*)    Platelets 69 (*)    All other components within normal limits  URINALYSIS, W/ REFLEX TO CULTURE (INFECTION SUSPECTED) - Abnormal; Notable for the following components:   Color, Urine YELLOW (*)    APPearance CLOUDY (*)    All other components within normal limits  URINE DRUG SCREEN, QUALITATIVE ([facility] ONLY) - Abnormal; Notable for the following components:   Tricyclic,  Ur Screen POSITIVE (*)    All other components within normal limits  ACETAMINOPHEN LEVEL - Abnormal; Notable for the following components:   Acetaminophen (Tylenol), Serum <10 (*)    All other components within normal limits  SALICYLATE LEVEL - Abnormal; Notable for the following components:   Salicylate Lvl <7.0 (*)    All other components within normal limits  CK - Abnormal; Notable for the following components:   Total CK 8,863 (*)    All other components within normal limits  VALPROIC ACID LEVEL - Abnormal; Notable for the following components:   Valproic Acid Lvl 105 (*)    All other components within normal limits  CBG MONITORING, ED - Abnormal; Notable for the following components:   Glucose-Capillary 59 (*)    All other components within normal limits  LACTIC ACID, PLASMA  ETHANOL  MAGNESIUM  LACTIC ACID, PLASMA  CBG MONITORING, ED     EKG Interpreted by me Sinus rhythm rate of 91.  Normal axis, normal intervals.  Normal QRS ST segments and T waves   RADIOLOGY X-ray right  foot interpreted by me, negative for fracture or subcutaneous emphysema.  Radiology report reviewed.   CT head and C-spine unremarkable  Ultrasound venous bilateral lower extremities negative for DVT  PROCEDURES:  Procedures   MEDICATIONS ORDERED IN ED: Medications  lactated ringers bolus 1,000 mL (has no administration in time range)  cefTRIAXone (ROCEPHIN) 2 g in sodium chloride 0.9 % 100 mL IVPB (2 g Intravenous New Bag/Given 02/21/23 1839)     IMPRESSION / MDM / ASSESSMENT AND PLAN / ED COURSE  I reviewed the triage vital signs and the nursing notes.  DDx: Cellulitis, foot fracture, DVT, intracranial hemorrhage, C-spine fracture, AKI, dehydration, electrolyte abnormality, anemia, UTI, rhabdomyolysis  Patient's presentation is most consistent with acute presentation with potential threat to life or bodily function.  Patient presents with fall and bruising of the right foot likely due to blunt trauma.  X-ray negative for fracture.  Left lower extremity is concerning for an enlarged area of cellulitis containing multiple skin wounds.  With his comorbidities including schizophrenia, diabetes, living in a group home, I think he will require hospitalization to adequately treat this even though he is not currently septic.    Labs also reveal AKI and elevated CK of 8800 concerning for rhabdomyolysis.  Compartments are soft.  Doubt necrotizing fasciitis or osteomyelitis.   ----------------------------------------- 6:55 PM on 02/21/2023 ----------------------------------------- Case discussed with hospitalist       FINAL CLINICAL IMPRESSION(S) / ED DIAGNOSES   Final diagnoses:  Cellulitis of left lower extremity  Chronic schizophrenia (HCC)  AKI (acute kidney injury) (HCC)  Type 2 diabetes mellitus without complication, without long-term current use of insulin (HCC)     Rx / DC Orders   ED Discharge Orders     None        Note:  This document was prepared using  Dragon voice recognition software and may include unintentional dictation errors.   Sharman Cheek, MD 02/21/23 [phone]

## 2023-02-21 NOTE — H&P (Addendum)
History and Physical    Derrick Atkins JYN:[id number] DOB: 07-08-1971 DOA: 02/21/2023  Referring MD/NP/PA:   PCP: Gracelyn Nurse, MD   Patient coming from:  The patient is coming from group home.     Chief Complaint: left leg pain and fall  HPI: Derrick Atkins is a 52 y.o. male with medical history significant of schizophrenia, hypertension, hyperlipidemia, diabetes mellitus, hypothyroidism, anemia, cerebral palsy, aggressive behavior, thrombocytopenia, left lower leg cellulitis with abscess, who presents with pain in left lower leg.   Patient had history of left lower leg cellulitis with abscess, and admitted twice in June. Patient was s/p of I&D. Pt has worsening pain in left lower leg in the past several days. His left lower leg is swollen and erythematous.  His pain seems to be at least moderate.  He has multiple small wounds in left lower leg. Patient has been taking Bactrim since 8/9 without improvement. He is lethargic today, cannot provide detailed information to further characterize his pain.  Patient falls asleep quickly during the interview, but is easily arousable.  He is orientated x 3 and moves all extremities. No fever or chills.  Patient has generalized weakness.  Patient has poor appetite and decreased oral intake.  Patient fell today.  No head or neck injury.  He injured his right foot, with some right foot pain.  No chest pain, cough, shortness of breath.  No active nausea, vomiting, diarrhea or abdominal pain.   Data reviewed independently and ED Course: pt was found to have WBC 5.9, lactic acid 1.3, negative urinalysis, CK 80 800, AKI with creatinine 2.20, BUN 42, GFR 36 (baseline creatinine 0.87 on 12/23/2022), abnormal liver function (ALP 57, AST 325, ALT 69, total bilirubin 1.3), Tylenol level less than 10, salicylate level less than 7, magnesium 2.3, valproic acid level 105 which is slightly supratherapeutic. X-ray of right foot is negative for fracture.  CT of head  and CT of C-spine negative for acute issues.  Lower extremity venous Doppler negative for DVT.  Patient is admitted to telemetry bed as inpatient.   EKG: I have personally reviewed.  Sinus rhythm, QTc 578, early R wave progression, nonspecific T wave change.   Review of Systems:   General: no fevers, chills, no body weight gain, has poor appetite, has fatigue HEENT: no blurry vision, hearing changes or sore throat Respiratory: no dyspnea, coughing, wheezing CV: no chest pain, no palpitations GI: no nausea, vomiting, abdominal pain, diarrhea, constipation GU: no dysuria, burning on urination, increased urinary frequency, hematuria  Ext: has leg edema and pain in left lower leg Neuro: no unilateral weakness, numbness, or tingling, no vision change or hearing loss. Has fall and lethargy Skin: has multiple small wounds in left lower leg MSK: No muscle spasm, no deformity, no limitation of range of movement in spin Heme: No easy bruising.  Travel history: No recent long distant travel.   Allergy:  Allergies  Allergen Reactions   Phenytoin Sodium Extended Other (See Comments) and Nausea And Vomiting   Prednisone Hives and Nausea And Vomiting   Latex Hives, Nausea And Vomiting and Rash    Past Medical History:  Diagnosis Date   Cerebral palsy (HCC)    Depression    Diabetes mellitus without complication (HCC)    Hypertension    Kidney stones    Schizoaffective disorder (HCC)    Scoliosis     Past Surgical History:  Procedure Laterality Date   BOWEL RESECTION     CATARACT  EXTRACTION W/PHACO Right 09/15/2022   Procedure: CATARACT EXTRACTION PHACO AND INTRAOCULAR LENS PLACEMENT (IOC) RIGHT DIABETIC VISION BLUE HEALON 5  15.79  01:31.9;  Surgeon: Lockie Mola, MD;  Location: [facility];  Service: Ophthalmology;  Laterality: Right;  Latex Diabetic   INCISION AND DRAINAGE ABSCESS Left 12/20/2022   Procedure: INCISION AND DRAINAGE ABSCESS;  Surgeon: Campbell Lerner,  MD;  Location: [facility];  Service: General;  Laterality: Left;   LAPAROTOMY N/A 05/15/2020   Procedure: EXPLORATORY LAPAROTOMY;  Surgeon: Henrene Dodge, MD;  Location: [facility];  Service: General;  Laterality: N/A;    Social History:  reports that he has never smoked. He has never been exposed to tobacco smoke. He has never used smokeless tobacco. He reports that he does not drink alcohol and does not use drugs.  Family History:  Family History  Problem Relation Age of Onset   Heart disease Father    Heart attack Father      Prior to Admission medications   Medication Sig Start Date End Date Taking? Authorizing Provider  ABILIFY MAINTENA 400 MG PRSY prefilled syringe Inject 400 mg into the muscle every 28 (twenty-eight) days.   Yes [provider]  acetaminophen (TYLENOL) 500 MG tablet Take 1,000 mg by mouth 3 (three) times daily as needed for mild pain or moderate pain.   Yes [provider]  ascorbic acid (VITAMIN C) 500 MG tablet Take 1 tablet (500 mg total) by mouth daily. 12/24/22  Yes Marcelino Duster, MD  aspirin EC 81 MG tablet Take 1 tablet (81 mg total) by mouth daily. Swallow whole. 04/09/22  Yes [name], [name], MD  benztropine (COGENTIN) 1 MG tablet Take 1 tablet (1 mg total) by mouth daily. Patient taking differently: Take 1 mg by mouth 2 (two) times daily. 04/08/22  Yes [name], [name], MD  busPIRone (BUSPAR) 15 MG tablet Take 15 mg by mouth 3 (three) times daily. 12/11/22  Yes [provider]  carboxymethylcellulose (REFRESH PLUS) 0.5 % SOLN Place 1 drop into both eyes 3 (three) times daily as needed (dry eyes).   Yes [provider]  cholecalciferol (VITAMIN D3) 25 MCG (1000 UNIT) tablet Take 1,000 Units by mouth daily.   Yes [provider]  divalproex (DEPAKOTE) 250 MG DR tablet Take 3 tablets (750 mg total) by mouth every 12 (twelve) hours. 04/08/22  Yes [name], [name], MD  fenofibrate 54 MG tablet Take 54 mg by mouth daily.    Yes [provider]  folic acid (FOLVITE) 1 MG tablet Take 1 tablet (1 mg total) by mouth daily. 12/24/22  Yes Marcelino Duster, MD  furosemide (LASIX) 20 MG tablet Take 20 mg by mouth in the morning.   Yes [provider]  gemfibrozil (LOPID) 600 MG tablet Take 1 tablet (600 mg total) by mouth 2 (two) times daily. 04/08/22  Yes [name], [name], MD  hydrOXYzine (ATARAX) 50 MG tablet Take 1 tablet (50 mg total) by mouth every 4 (four) hours as needed for anxiety. Patient taking differently: Take 50 mg by mouth daily as needed for anxiety or itching. 04/08/22  Yes [name], [name], MD  iron polysaccharides (NIFEREX) 150 MG capsule Take 1 capsule (150 mg total) by mouth daily. 12/24/22  Yes Marcelino Duster, MD  levothyroxine (SYNTHROID) 88 MCG tablet Take 1 tablet (88 mcg total) by mouth daily at 6 (six) AM. 04/09/22  Yes [name], [name], MD  loperamide (IMODIUM) 2 MG capsule Take 2 mg by mouth 4 (  four) times daily as needed for diarrhea or loose stools.   Yes [provider]  OLANZapine (ZYPREXA) 10 MG tablet Take 10 mg by mouth at bedtime. (Take with 20mg  tablet to equal 30mg  total)   Yes [provider]  OLANZapine (ZYPREXA) 20 MG tablet Take 1 tablet (20 mg total) by mouth at bedtime. Patient taking differently: Take 20 mg by mouth at bedtime. (Take with 10mg  tablet to equal 30mg  total) 04/08/22  Yes [name], [name], MD  polyethylene glycol (MIRALAX / GLYCOLAX) 17 g packet Take 17 g by mouth daily.   Yes [provider]  potassium chloride (KLOR-CON) 10 MEQ tablet Take 10 mEq by mouth daily.   Yes [provider]  rosuvastatin (CRESTOR) 5 MG tablet Take 1 tablet (5 mg total) by mouth at bedtime. 01/07/23  Yes Enedina Finner, MD  senna (SENOKOT) 8.6 MG TABS tablet Take 2 tablets by mouth at bedtime.   Yes [provider]  sulfamethoxazole-trimethoprim (BACTRIM DS) 800-160 MG tablet Take 1 tablet by mouth 2 (two) times daily. 02/18/23  02/28/23 Yes [provider]  traZODone (DESYREL) 100 MG tablet Take 100 mg by mouth at bedtime.   Yes [provider]  albuterol (VENTOLIN HFA) 108 (90 Base) MCG/ACT inhaler Inhale 1-2 puffs into the lungs every 4 (four) hours as needed. Patient not taking: Reported on 02/21/2023 08/16/22   [provider]    Physical Exam: Vitals:   02/21/23 1424 02/21/23 1630 02/21/23 1820 02/21/23 1830  BP:  127/71 97/73 117/75  Pulse:  93 (!) 103 92  Resp:  15 19 20   Temp:      TempSrc:      SpO2:  100% 100% 100%  Weight: 94.3 kg     Height: 6\' 3"  (1.905 m)      General: Not in acute distress HEENT:       Eyes: PERRL, EOMI, no jaundice       ENT: No discharge from the ears and nose, no pharynx injection, no tonsillar enlargement.        Neck: No JVD, no bruit, no mass felt. Heme: No neck lymph node enlargement. Cardiac: S1/S2, RRR, No murmurs, No gallops or rubs. Respiratory: No rales, wheezing, rhonchi or rubs. GI: Soft, nondistended, nontender, no rebound pain, no organomegaly, BS present. GU: No hematuria Ext: has swelling, erythema, tenderness, warmth in left lower leg, with multiple small wounds in left lower leg.  Has mild swelling in right lower leg.  Has bruise in right foot.     Musculoskeletal: No joint deformities, No joint redness or warmth, no limitation of ROM in spin. Has right foot pain Skin: No rashes.  Neuro: Patient is lethargic, but easily arousable, oriented X3, cranial nerves II-XII grossly intact, moves all extremities normally.  Psych: Patient is not psychotic, no suicidal or hemocidal ideation. Pt is calm now  Labs on Admission: I have personally reviewed following labs and imaging studies  CBC: Recent Labs  Lab 02/21/23 1551  WBC 5.9  HGB 10.5*  HCT 30.3*  MCV 84.2  PLT 69*   Basic Metabolic Panel: Recent Labs  Lab 02/21/23 1551 02/21/23 1607  NA 136  --   K 3.6  --   CL 96*  --   CO2 27  --   GLUCOSE 73  --   BUN 42*   --   CREATININE 2.20*  --   CALCIUM 8.9  --   MG  --  2.3   GFR: Estimated  Creatinine Clearance: 48 mL/min (A) (by C-G formula based on SCr of 2.2 mg/dL (H)). Liver Function Tests: Recent Labs  Lab 02/21/23 1551  AST 325*  ALT 69*  ALKPHOS 57  BILITOT 1.3*  PROT 6.4*  ALBUMIN 3.6   No results for input(s): "LIPASE", "AMYLASE" in the last 168 hours. No results for input(s): "AMMONIA" in the last 168 hours. Coagulation Profile: No results for input(s): "INR", "PROTIME" in the last 168 hours. Cardiac Enzymes: Recent Labs  Lab 02/21/23 1607  CKTOTAL 8,863*   BNP (last 3 results) No results for input(s): "PROBNP" in the last 8760 hours. HbA1C: No results for input(s): "HGBA1C" in the last 72 hours. CBG: Recent Labs  Lab 02/21/23 1641 02/21/23 1712  GLUCAP 59* 72   Lipid Profile: No results for input(s): "CHOL", "HDL", "LDLCALC", "TRIG", "CHOLHDL", "LDLDIRECT" in the last 72 hours. Thyroid Function Tests: No results for input(s): "TSH", "T4TOTAL", "FREET4", "T3FREE", "THYROIDAB" in the last 72 hours. Anemia Panel: No results for input(s): "VITAMINB12", "FOLATE", "FERRITIN", "TIBC", "IRON", "RETICCTPCT" in the last 72 hours. Urine analysis:    Component Value Date/Time   COLORURINE YELLOW (A) 02/21/2023 1551   APPEARANCEUR CLOUDY (A) 02/21/2023 1551   APPEARANCEUR Clear 12/14/2012 0619   LABSPEC 1.012 02/21/2023 1551   LABSPEC 1.030 12/14/2012 0619   PHURINE 5.0 02/21/2023 1551   GLUCOSEU NEGATIVE 02/21/2023 1551   GLUCOSEU 50 mg/dL 16/04/9603 5409   HGBUR NEGATIVE 02/21/2023 1551   BILIRUBINUR NEGATIVE 02/21/2023 1551   BILIRUBINUR Negative 12/14/2012 0619   KETONESUR NEGATIVE 02/21/2023 1551   PROTEINUR NEGATIVE 02/21/2023 1551   NITRITE NEGATIVE 02/21/2023 1551   LEUKOCYTESUR NEGATIVE 02/21/2023 1551   LEUKOCYTESUR Negative 12/14/2012 0619   Sepsis Labs: @LABRCNTIP (procalcitonin:4,lacticidven:4) )No results found for this or any previous visit (from the  past 240 hour(s)).   Radiological Exams on Admission: CT Head Wo Contrast  Result Date: 02/21/2023 CLINICAL DATA:  Mental status change, unknown cause; Neck trauma, intoxicated or obtunded (Age >= 16y) fall EXAM: CT HEAD WITHOUT CONTRAST CT CERVICAL SPINE WITHOUT CONTRAST TECHNIQUE: Multidetector CT imaging of the head and cervical spine was performed following the standard protocol without intravenous contrast. Multiplanar CT image reconstructions of the cervical spine were also generated. RADIATION DOSE REDUCTION: This exam was performed according to the departmental dose-optimization program which includes automated exposure control, adjustment of the mA and/or kV according to patient size and/or use of iterative reconstruction technique. COMPARISON:  CT head 01/18/22 FINDINGS: CT HEAD FINDINGS Brain: No evidence of acute infarction, hemorrhage, hydrocephalus, extra-axial collection or mass lesion/mass effect. Vascular: No hyperdense vessel or unexpected calcification. Skull: Normal. Negative for fracture or focal lesion. Sinuses/Orbits: No middle ear or mastoid effusion. Paranasal sinuses are clear. Orbits are right lens replacement. Other: None. CT CERVICAL SPINE FINDINGS Alignment: Trace anterolisthesis of C4 on C5. Skull base and vertebrae: No acute fracture. No primary bone lesion or focal pathologic process. Soft tissues and spinal canal: No prevertebral fluid or swelling. No visible canal hematoma. Disc levels:  No evidence high-grade spinal canal stenosis. Upper chest: Negative. Other: None IMPRESSION: 1. No acute intracranial abnormality. 2. No acute fracture or traumatic subluxation of the cervical spine. Electronically Signed   By: Lorenza Cambridge M.D.   On: 02/21/2023 18:16   CT Cervical Spine Wo Contrast  Result Date: 02/21/2023 CLINICAL DATA:  Mental status change, unknown cause; Neck trauma, intoxicated or obtunded (Age >= 16y) fall EXAM: CT HEAD WITHOUT CONTRAST CT CERVICAL SPINE WITHOUT  CONTRAST TECHNIQUE: Multidetector CT imaging of the  head and cervical spine was performed following the standard protocol without intravenous contrast. Multiplanar CT image reconstructions of the cervical spine were also generated. RADIATION DOSE REDUCTION: This exam was performed according to the departmental dose-optimization program which includes automated exposure control, adjustment of the mA and/or kV according to patient size and/or use of iterative reconstruction technique. COMPARISON:  CT head 01/18/22 FINDINGS: CT HEAD FINDINGS Brain: No evidence of acute infarction, hemorrhage, hydrocephalus, extra-axial collection or mass lesion/mass effect. Vascular: No hyperdense vessel or unexpected calcification. Skull: Normal. Negative for fracture or focal lesion. Sinuses/Orbits: No middle ear or mastoid effusion. Paranasal sinuses are clear. Orbits are right lens replacement. Other: None. CT CERVICAL SPINE FINDINGS Alignment: Trace anterolisthesis of C4 on C5. Skull base and vertebrae: No acute fracture. No primary bone lesion or focal pathologic process. Soft tissues and spinal canal: No prevertebral fluid or swelling. No visible canal hematoma. Disc levels:  No evidence high-grade spinal canal stenosis. Upper chest: Negative. Other: None IMPRESSION: 1. No acute intracranial abnormality. 2. No acute fracture or traumatic subluxation of the cervical spine. Electronically Signed   By: Lorenza Cambridge M.D.   On: 02/21/2023 18:16   US Venous Img Lower Bilateral  Result Date: 02/21/2023 CLINICAL DATA:  Bilateral lower extremity edema EXAM: Bilateral LOWER EXTREMITY VENOUS DOPPLER ULTRASOUND TECHNIQUE: Gray-scale sonography with compression, as well as color and duplex ultrasound, were performed to evaluate the deep venous system(s) from the level of the common femoral vein through the popliteal and proximal calf veins. COMPARISON:  None Available. FINDINGS: VENOUS Normal compressibility of the common femoral,  superficial femoral, and popliteal veins, as well as the visualized calf veins. Visualized portions of profunda femoral vein and great saphenous vein unremarkable. No filling defects to suggest DVT on grayscale or color Doppler imaging. Doppler waveforms show normal direction of venous flow, normal respiratory plasticity and response to augmentation. Limited views of the contralateral common femoral vein are unremarkable. OTHER Bilateral calf edema. Limitations: none IMPRESSION: Negative for acute DVT. Electronically Signed   By: Minerva Fester M.D.   On: 02/21/2023 17:56   DG Foot 2 Views Right  Result Date: 02/21/2023 CLINICAL DATA:  Pain and bruised toes. EXAM: RIGHT FOOT - 2 VIEW COMPARISON:  None Available. FINDINGS: Two views of the right foot. No evidence of fracture or dislocation. There is no evidence of arthropathy or other focal bone abnormality. Soft tissues are unremarkable. IMPRESSION: Negative right foot radiographs. Electronically Signed   By: Orvan Falconer M.D.   On: 02/21/2023 15:20      Assessment/Plan Principal Problem:   Cellulitis of left lower extremity Active Problems:   Fall at home, initial encounter   AKI (acute kidney injury) (HCC)   Rhabdomyolysis   Type 2 diabetes mellitus without complications (HCC)   Hypoglycemia   Essential hypertension   Thrombocytopenia (HCC)   Hypothyroidism   Dyslipidemia   Iron deficiency anemia   Abnormal LFTs   Schizophrenia, paranoid, chronic (HCC)   Long QT interval   Assessment and Plan:   Cellulitis of left lower extremity: No fever or leukocytosis, lactic acid is normal 1.3.  Clinically not septic.  Lower extremity venous Doppler negative for DVT.   - will admit tele bed as inpt - Empiric antimicrobial treatment with vancomycin, Rocephin - PRN oxycodone for pain - Blood cultures x 2  - ESR and CRP - wound care consult - IVF: 1. 5L of NS bolus, then 125 cc/h  Fall: no acute injury on images including CT of  head,  CT of C-spine, x-ray of right foot -Fall precaution -PT/OT   Diet controled type 2 diabetes mellitus without complications (HCC): Recent A1c 4.7.  Well-controlled.  Patient not taking medications currently.  Has hypoglycemia today. -No treatment needed now  Hypoglycemia: Blood sugar 59, which improved to 72 -Check CBG every 2 hour -D50 prn   AKI (acute kidney injury) (HCC): Likely multifactorial etiology including dehydration, Bactrim use, continuation of Lasix -Avoid using renal toxic medications. -Hold Lasix and Bactrim -IV fluid as above   Essential hypertension: Blood pressure 117/75. Patient is taking Lasix -IV hydralazine as needed -Hold Lasix due to worsening renal function   Schizophrenia, paranoid, chronic (HCC): Patient is calm now.  Depakote level is 105, which is slightly supratherapeutic -Continue home medications, hydroxyzine, olanzapine, Cogentin -pt is also on Abilify injection every 28 days -will hold buspar and trazodone due to QT prolongation (578) -Hold the Depakote, and repeat Depakote level in AM   Hypothyroidism -Synthroid   Dyslipidemia -Fenofibrate -hold Crestor due to abnormal LFT and rhabdomyolysis   Iron deficiency anemia: stable HH. Hemoglobin 10.5 (8.9 on 01/07/2023) -Continue home iron supplement  Rhabdomyolysis: CK 8800 -IVF as above  Thrombocytopenia (HCC): This is chronic issue.  Platelets 69 (83 recently).  No active bleeding. -f/u by CBC  Abnormal LFTs: Tylenol level less than 10.  Possibly due to ongoing infection -Check hepatitis panel -Avoid using Tylenol -hold Crestor  Long QT interval: QTc 578 -Hold BuSpar and trazodone -Avoid using Zofran       DVT ppx: SCD  Code Status: Full code   Family Communication:  Yes, patient's brother by phone  Disposition Plan:  Anticipate discharge back to previous environment, group home  Consults called:  none  Admission status and Level of care: Telemetry Medical:    as inpt     Dispo: The patient is from: Group home              Anticipated d/c is to: Group home              Anticipated d/c date is: 2 days              Patient currently is not medically stable to d/c.    Severity of Illness:  The appropriate patient status for this patient is INPATIENT. Inpatient status is judged to be reasonable and necessary in order to provide the required intensity of service to ensure the patient's safety. The patient's presenting symptoms, physical exam findings, and initial radiographic and laboratory data in the context of their chronic comorbidities is felt to place them at high risk for further clinical deterioration. Furthermore, it is not anticipated that the patient will be medically stable for discharge from the hospital within 2 midnights of admission.   * I certify that at the point of admission it is my clinical judgment that the patient will require inpatient hospital care spanning beyond 2 midnights from the point of admission due to high intensity of service, high risk for further deterioration and high frequency of surveillance required.*       Date of Service 02/21/2023    Lorretta Harp [facility]   If 7PM-7AM, please contact night-coverage [email or web address] 02/21/2023, 7:53 PM

## 2023-02-21 NOTE — Consult Note (Addendum)
Pharmacy Antibiotic Note  ASSESSMENT: 52 y.o. male with PMH cellulitis, diabetes, schizoprenia is presenting with cellulitis. Patient is complaining of toe pain. Foot xray is negative for acute findings. He is afebrile and WBCs are within normal limits. Pharmacy has been consulted to manage vancomycin dosing. Patient presenting with AKI so pharmacy to dose by level for now.  Patient measurements: Height: 6\' 3"  (190.5 cm) Weight: 94.3 kg (207 lb 14.3 oz) IBW/kg (Calculated) : 84.5  Vital signs: Temp: 97.7 F (36.5 C) (08/12 1423) Temp Source: Oral (08/12 1423) BP: 117/75 (08/12 1830) Pulse Rate: 92 (08/12 1830) Recent Labs  Lab 02/21/23 1551  WBC 5.9  CREATININE 2.20*   Estimated Creatinine Clearance: 48 mL/min (A) (by C-G formula based on SCr of 2.2 mg/dL (H)).  Allergies: Allergies  Allergen Reactions   Phenytoin Sodium Extended Other (See Comments) and Nausea And Vomiting   Prednisone Hives and Nausea And Vomiting   Latex Hives, Nausea And Vomiting and Rash    Antimicrobials this admission: Ceftriaxone 8/12 >>  Vancomycin 8/12 >>  Dose adjustments this admission: N/A  Microbiology results: N/A   PLAN: Administer vancomycin 2000 mg IV x 1 as a load Vancomycin random level ordered for 8/13 @ 1000 which will reflect a 12-hr level Follow up culture results (if obtained) to assess for antibiotic optimization. Monitor renal function to assess for any necessary antibiotic dosing changes.   Thank you for allowing pharmacy to be a part of this patient's care.  Will M. Dareen Piano, PharmD Clinical Pharmacist 02/21/2023 7:27 PM

## 2023-02-21 NOTE — ED Provider Notes (Signed)
[facility] Provider Note    Event Date/Time   First MD Initiated Contact with Patient 02/21/23 1512     (approximate)   History   Toe Injury   HPI  Derrick Atkins is a 52 y.o. male with a past medical history of cellulitis, schizophrenia, diabetes who presents today via EMS for chief complaint of toe pain.  However, when I walked into the room, patient is difficult to awaken and unable to provide a history.  Patient Active Problem List   Diagnosis Date Noted   Cellulitis of left lower extremity 01/06/2023   Iron deficiency anemia 01/06/2023   Cellulitis and abscess of left lower extremity 01/06/2023   Nausea & vomiting 01/06/2023   AKI (acute kidney injury) (HCC) 12/13/2022   Dyslipidemia 12/13/2022   Schizophrenia (HCC) 03/23/2022   Aggressive behavior    Schizophrenia, paranoid, chronic (HCC)    Long QT interval 04/16/2014   Hypothyroidism 12/25/2013   Essential hypertension 05/03/2007   Type 2 diabetes mellitus without complications (HCC) 05/03/2007          Physical Exam   Triage Vital Signs: ED Triage Vitals  Encounter Vitals Group     BP 02/21/23 1423 131/75     Systolic BP Percentile --      Diastolic BP Percentile --      Pulse Rate 02/21/23 1423 (!) 101     Resp 02/21/23 1423 17     Temp 02/21/23 1423 97.7 F (36.5 C)     Temp Source 02/21/23 1423 Oral     SpO2 02/21/23 1423 100 %     Weight 02/21/23 1424 207 lb 14.3 oz (94.3 kg)     Height 02/21/23 1424 6\' 3"  (1.905 m)     Head Circumference --      Peak Flow --      Pain Score 02/21/23 1424 3     Pain Loc --      Pain Education --      Exclude from Growth Chart --     Most recent vital signs: Vitals:   02/21/23 1630 02/21/23 1820  BP: 127/71 97/73  Pulse: 93 (!) 103  Resp: 15 19  Temp:    SpO2: 100% 100%    Physical Exam Vitals and nursing note reviewed.  Constitutional:      General: Extremely somnolent, flutters eyes to loud voice and sternal rub,  then promptly falls back to sleep.  Pale    Appearance: Normal appearance. The patient is normal weight.  HENT:     Head: Normocephalic and atraumatic.     Mouth: Mucous membranes are moist.  Eyes:     General: PERRL.       Right eye: No discharge.        Left eye: No discharge.     Conjunctiva/sclera: Conjunctivae normal.  Cardiovascular:     Rate and Rhythm: Normal rate and regular rhythm.     Pulses: Normal pulses.  Pulmonary:     Effort: Pulmonary effort is normal. No respiratory distress.  Abdominal:     Abdomen is soft. There is no abdominal tenderness. No rebound or guarding. No distention. Musculoskeletal:        General: No swelling. Normal range of motion.     Cervical back: Normal range of motion and neck supple.  Skin:    General: Skin is warm and dry.     Capillary Refill: Capillary refill takes less than 2 seconds.     Findings:  Bandage to left lower extremity with multiple abrasions with surrounding erythema.  Patient also has multiple bruises to his bilateral upper extremities.  Neurological:     Mental Status: Somnolent, not following commands     ED Results / Procedures / Treatments   Labs (all labs ordered are listed, but only abnormal results are displayed) Labs Reviewed  COMPREHENSIVE METABOLIC PANEL - Abnormal; Notable for the following components:      Result Value   Chloride 96 (*)    BUN 42 (*)    Creatinine, Ser 2.20 (*)    Total Protein 6.4 (*)    AST 325 (*)    ALT 69 (*)    Total Bilirubin 1.3 (*)    GFR, Estimated 36 (*)    All other components within normal limits  CBC - Abnormal; Notable for the following components:   RBC 3.60 (*)    Hemoglobin 10.5 (*)    HCT 30.3 (*)    Platelets 69 (*)    All other components within normal limits  URINALYSIS, W/ REFLEX TO CULTURE (INFECTION SUSPECTED) - Abnormal; Notable for the following components:   Color, Urine YELLOW (*)    APPearance CLOUDY (*)    All other components within normal limits   CK - Abnormal; Notable for the following components:   Total CK 8,863 (*)    All other components within normal limits  CBG MONITORING, ED - Abnormal; Notable for the following components:   Glucose-Capillary 59 (*)    All other components within normal limits  LACTIC ACID, PLASMA  MAGNESIUM  URINE DRUG SCREEN, QUALITATIVE ([facility] ONLY)  LACTIC ACID, PLASMA  ETHANOL  ACETAMINOPHEN LEVEL  SALICYLATE LEVEL  VALPROIC ACID LEVEL  CBG MONITORING, ED     EKG     RADIOLOGY     PROCEDURES:  Critical Care performed:   Procedures   MEDICATIONS ORDERED IN ED: Medications - No data to display   IMPRESSION / MDM / ASSESSMENT AND PLAN / ED COURSE  I reviewed the triage vital signs and the nursing notes.   Differential diagnosis includes, but is not limited to, hypoglycemia, sepsis/SIRS, intracranial hemorrhage, substance abuse, endocrine crisis.  When I walked into the room, patient is sleeping and difficult to awaken.  He is hemodynamically stable and afebrile.  He awakens to loud voice and sternal rub, but then quickly falls back to sleep.  I reviewed the patient's chart.  Patient had a recent admission in June of this year for hypoglycemia, as well as increased leg pain and swelling.  He was started on IV antibiotics and admitted for further management.  Discussed with Dr. Modesto Charon, who recommends moving the patient to the main side of the ER.  Pass off was given to to Dr. Scotty Court.   Patient's presentation is most consistent with acute presentation with potential threat to life or bodily function.    FINAL CLINICAL IMPRESSION(S) / ED DIAGNOSES   Final diagnoses:  None     Rx / DC Orders   ED Discharge Orders     None        Note:  This document was prepared using Dragon voice recognition software and may include unintentional dictation errors.   Keturah Shavers 02/21/23 1823    Pilar Jarvis, MD 02/21/23 2251

## 2023-02-21 NOTE — ED Notes (Signed)
Pt eating dinner at this time

## 2023-02-21 NOTE — ED Notes (Signed)
Patient transported to X-ray 

## 2023-02-21 NOTE — ED Triage Notes (Addendum)
Charted in error.

## 2023-02-21 NOTE — ED Notes (Signed)
CBG recheck 72, 8 oz orange juice given and consumed by pt

## 2023-02-22 DIAGNOSIS — L03116 Cellulitis of left lower limb: Secondary | ICD-10-CM | POA: Diagnosis not present

## 2023-02-22 LAB — GLUCOSE, CAPILLARY
Glucose-Capillary: 78 mg/dL (ref 70–99)
Glucose-Capillary: 66 mg/dL — ABNORMAL LOW (ref 70–99)
Glucose-Capillary: 67 mg/dL — ABNORMAL LOW (ref 70–99)
Glucose-Capillary: 76 mg/dL (ref 70–99)
Glucose-Capillary: 102 mg/dL — ABNORMAL HIGH (ref 70–99)
Glucose-Capillary: 98 mg/dL (ref 70–99)
Glucose-Capillary: 101 mg/dL — ABNORMAL HIGH (ref 70–99)
Glucose-Capillary: 102 mg/dL — ABNORMAL HIGH (ref 70–99)

## 2023-02-22 MED ORDER — MORPHINE SULFATE (PF) 2 MG/ML IV SOLN: 1.0000 mg | INTRAVENOUS | Status: DC | PRN

## 2023-02-22 MED ORDER — DEXTROSE-SODIUM CHLORIDE 5-0.9 % IV SOLN: INTRAVENOUS | Status: DC

## 2023-02-22 MED ORDER — POTASSIUM CHLORIDE CRYS ER 20 MEQ PO TBCR
20.0000 meq | EXTENDED_RELEASE_TABLET | Freq: Once | ORAL | Status: AC
  Administered 2023-02-22: 20 meq via ORAL
  Filled 2023-02-22: qty 1

## 2023-02-22 MED ORDER — DIVALPROEX SODIUM 500 MG PO DR TAB
750.0000 mg | DELAYED_RELEASE_TABLET | Freq: Two times a day (BID) | ORAL | Status: DC
  Administered 2023-02-22 – 2023-02-28 (×13): 750 mg via ORAL
  Filled 2023-02-22 (×13): qty 1

## 2023-02-22 NOTE — Consult Note (Signed)
WOC Nurse Consult Note: Reason for Consult: several full and partial thickness wounds to left pretibial area. Consult performed remotely following review of EMR including photographs. Wound type:infectious Pressure Injury POA: N/A Measurement: Bedside RN to measure distal and largest skin injury and document measurements on nursing flow sheet with next dressing application Wound bed:red, dry. See also photodocumentation. Drainage (amount, consistency, odor) None Periwound:erythematous, intact Dressing procedure/placement/frequency:I have provided Nursing with guidance for the topical care of these wounds using a daily cleanse with NS followed by pat dry and placement of antimicrobial nonadherent gauze, xeroform. This is to be topped with ABD pad and secured with a fw turns of Kerlix roll gauze for atraumatic changes.  WOC nursing team will not follow, but will remain available to this patient, the nursing and medical teams.  Please re-consult if needed.  Thank you for inviting [place] to participate in this patient's Plan of Care.  Ladona Mow, MSN, RN, CNS, GNP, Leda Min, [employer], [employer] phone:  [phone]

## 2023-02-22 NOTE — TOC PASRR Note (Signed)
RE: Derrick Atkins  Date of Birth: Nov 29, 1970 Date: 02/22/2023     To Whom It May Concern:   Please be advised that the above-named patient will require a short-term nursing home stay - anticipated 30 days or less for rehabilitation and strengthening.  The plan is for return home

## 2023-02-22 NOTE — TOC Initial Note (Addendum)
Transition of Care [facility]) - Initial/Assessment Note    Patient Details  Name: Derrick Atkins MRN: [id number] Date of Birth: 1970-11-06  [facility]) CM/SW Contact:    Allena Katz, LCSW Phone Number: 02/22/2023, 1:42 PM  Clinical Narrative:   CSW LVM with patients guardian candace gobble regarding SNF.                3:19pm  CSW spoke with Candace who is agreeable to rehab for patient. Candace would like workup to be started in nearby area and for TOC to call once offers for rehab come in.        Patient Goals and CMS Choice            Expected Discharge Plan and Services                                              Prior Living Arrangements/Services                       Activities of Daily Living Home Assistive Devices/Equipment: None ADL Screening (condition at time of admission) Patient's cognitive ability adequate to safely complete daily activities?: No Is the patient deaf or have difficulty hearing?: No Does the patient have difficulty seeing, even when wearing glasses/contacts?: No Does the patient have difficulty concentrating, remembering, or making decisions?: Yes Patient able to express need for assistance with ADLs?: Yes Does the patient have difficulty dressing or bathing?: No Independently performs ADLs?: Yes (appropriate for developmental age) Does the patient have difficulty walking or climbing stairs?: No Weakness of Legs: Left Weakness of Arms/Hands: Right  Permission Sought/Granted                  Emotional Assessment              Admission diagnosis:  Chronic schizophrenia (HCC) [F20.9] Cellulitis of left lower extremity [L03.116] AKI (acute kidney injury) (HCC) [N17.9] Type 2 diabetes mellitus without complication, without long-term current use of insulin (HCC) [E11.9] Patient Active Problem List   Diagnosis Date Noted   Thrombocytopenia (HCC) 02/21/2023   Rhabdomyolysis 02/21/2023    Hypoglycemia 02/21/2023   Abnormal LFTs 02/21/2023   Fall at home, initial encounter 02/21/2023   Cellulitis of left lower extremity 01/06/2023   Iron deficiency anemia 01/06/2023   Cellulitis and abscess of left lower extremity 01/06/2023   Nausea & vomiting 01/06/2023   AKI (acute kidney injury) (HCC) 12/13/2022   Dyslipidemia 12/13/2022   Schizophrenia (HCC) 03/23/2022   Aggressive behavior    Schizophrenia, paranoid, chronic (HCC)    Long QT interval 04/16/2014   Hypothyroidism 12/25/2013   Essential hypertension 05/03/2007   Type 2 diabetes mellitus without complications (HCC) 05/03/2007   PCP:  Gracelyn Nurse, MD Pharmacy:   [facility] - [place], [place] - [address] [place] [address] Phone: [phone] Fax: [phone]  [facility] - [place], [place] - [address] [place] [address] Phone: [phone] Fax: [phone]     Social Determinants of Health (SDOH) Social History: SDOH Screenings   Food Insecurity: No Food Insecurity (02/21/2023)  Recent Concern: Food Insecurity - Food Insecurity Present (01/06/2023)  Housing: Low Risk  (02/21/2023)  Transportation Needs: Unmet Transportation Needs (02/21/2023)  Utilities: Not At Risk (02/21/2023)  Alcohol Screen: Low  Risk  (03/23/2022)  Tobacco Use: Low Risk  (02/21/2023)   SDOH Interventions:     Readmission Risk Interventions     No data to display

## 2023-02-22 NOTE — Evaluation (Signed)
Physical Therapy Evaluation Patient Details Name: Derrick Atkins MRN: [id number] DOB: [birth year] Today's Date: 02/22/2023  History of Present Illness  Pt is a 52 y.o. male with medical history significant of schizophrenia, hypertension, hyperlipidemia, diabetes mellitus, hypothyroidism, anemia, cerebral palsy, aggressive behavior, thrombocytopenia, left lower leg cellulitis with abscess, who presents with pain in left lower leg. Fell and hurt RLE.  Clinical Impression   Pt received in bed, he is agreeable to PT session. Pt performs bed mobility max A x2 and transfer mod A x1. Pt able to follow commands but requires cuing for hand placement for transfers. Pt required mod A x1 for seated balance at EOB but improved with time to CGA. Pt able to perform STS with RW and SPT from bed to recliner mod A x1, respectively, with R knee block for safety and cuing for hand placement, forward weight shift and standing upright. Pt in recliner at end of session, finishing his breakfast, but appeared slightly tired. RN was notified of Pt's status. Pt would benefit from skilled PT to address above deficits and promote optimal return to PLOF.        If plan is discharge home, recommend the following: A lot of help with walking and/or transfers;A lot of help with bathing/dressing/bathroom;Assistance with cooking/housework;Direct supervision/assist for medications management;Assist for transportation;Help with stairs or ramp for entrance   Can travel by private vehicle   No    Equipment Recommendations None recommended by PT  Recommendations for Other Services       Functional Status Assessment Patient has had a recent decline in their functional status and demonstrates the ability to make significant improvements in function in a reasonable and predictable amount of time.     Precautions / Restrictions Precautions Precautions: Fall Restrictions Weight Bearing Restrictions: No      Mobility  Bed  Mobility Overal bed mobility: Needs Assistance Bed Mobility: Supine to Sit     Supine to sit: Max assist, +2 for physical assistance     General bed mobility comments: Able to follow commands and initiate bed mobility but requires max A x2 due to weakness    Transfers Overall transfer level: Needs assistance Equipment used: Standard walker Transfers: Sit to/from Stand, Bed to chair/wheelchair/BSC Sit to Stand: Mod assist Stand pivot transfers: Mod assist         General transfer comment: Performed 1 STS on elevated surface with RW mod A x1 with cuing for forward lean and hand placement. RLE knee block due to buckling. Performed Stand pivot transfer mod A x1 from bed to recliner with cuing to stand upright, forward weight shift, hip facilitation for weight shift, and stepping.    Ambulation/Gait               General Gait Details: NT due to fatigue  Stairs            Wheelchair Mobility     Tilt Bed    Modified Rankin (Stroke Patients Only)       Balance Overall balance assessment: Needs assistance Sitting-balance support: Feet supported, Single extremity supported Sitting balance-Leahy Scale: Good Sitting balance - Comments: Pt required mod A x1 for forward weight shift to sit EOB initially but able to progress to CGA following 1 min of sitting. Able to sit EOB with 1 UE support on bed and occasional no BUE support. Postural control: Posterior lean Standing balance support: Bilateral upper extremity supported, During functional activity, Reliant on assistive device for balance Standing balance-Leahy Scale: Fair  Standing balance comment: Able to stand with BUE support on RW and PT but requires mod A x1 for safety and generalized BLE weakness                             Pertinent Vitals/Pain Pain Assessment Pain Assessment: Faces Faces Pain Scale: Hurts a little bit Pain Location: RLE Pain Descriptors / Indicators: Guarding, Aching Pain  Intervention(s): Limited activity within patient's tolerance, Monitored during session    Home Living Family/patient expects to be discharged to:: Group home Living Arrangements: Group Home Available Help at Discharge: Available PRN/intermittently Type of Home: Group Home Home Access: Level entry       Home Layout: One level Home Equipment: Rolling Walker (2 wheels);Grab bars - tub/shower Additional Comments: Pt has difficulty detailing home set-up but is able to answer to yes/no questions.    Prior Function Prior Level of Function : Needs assist             Mobility Comments: Use of RW in the community, hx of several falls ADLs Comments: Pt reports doing ADLs independently with no support     Extremity/Trunk Assessment   Upper Extremity Assessment Upper Extremity Assessment: Generalized weakness (grip strength on RUE weaker than LUE)    Lower Extremity Assessment Lower Extremity Assessment: Generalized weakness (Gross 3/5 MMT, able to follow commands for testing)       Communication   Communication Communication: Difficulty communicating thoughts/reduced clarity of speech;Difficulty following commands/understanding Following commands: Follows multi-step commands with increased time;Follows multi-step commands inconsistently Cueing Techniques: Verbal cues;Tactile cues  Cognition Arousal: Alert Behavior During Therapy: Flat affect Overall Cognitive Status: Impaired/Different from baseline                                 General Comments: Pt was AO x 4 but became more lethargic throughout session        General Comments      Exercises     Assessment/Plan    PT Assessment Patient needs continued PT services  PT Problem List Decreased strength;Decreased mobility;Decreased activity tolerance;Decreased balance;Pain       PT Treatment Interventions DME instruction;Gait training;Functional mobility training;Balance training;Therapeutic activities     PT Goals (Current goals can be found in the Care Plan section)  Acute Rehab PT Goals Patient Stated Goal: to get better PT Goal Formulation: With patient Time For Goal Achievement: 03/08/23 Potential to Achieve Goals: Good    Frequency Min 1X/week     Co-evaluation               AM-PAC PT "6 Clicks" Mobility  Outcome Measure Help needed turning from your back to your side while in a flat bed without using bedrails?: A Lot Help needed moving from lying on your back to sitting on the side of a flat bed without using bedrails?: A Lot Help needed moving to and from a bed to a chair (including a wheelchair)?: A Lot Help needed standing up from a chair using your arms (e.g., wheelchair or bedside chair)?: A Lot Help needed to walk in hospital room?: Total Help needed climbing 3-5 steps with a railing? : Total 6 Click Score: 10    End of Session Equipment Utilized During Treatment: Gait belt Activity Tolerance: Patient limited by fatigue;Patient limited by lethargy Patient left: in chair;with call bell/phone within reach;with chair alarm set Nurse Communication: Mobility status  PT Visit Diagnosis: Unsteadiness on feet (R26.81);History of falling (Z91.81);Muscle weakness (generalized) (M62.81);Repeated falls (R29.6);Pain Pain - Right/Left: Right Pain - part of body: Leg    Time: 1002-1030 PT Time Calculation (min) (ACUTE ONLY): 28 min   Charges:                 Elmon Else, SPT   Zohair Epp 02/22/2023, 12:17 PM

## 2023-02-22 NOTE — Progress Notes (Signed)
PROGRESS NOTE    Derrick Atkins  BJY:[id number] DOB: [birth year] DOA: 02/21/2023 PCP: Gracelyn Nurse, MD   Assessment & Plan:   Principal Problem:   Cellulitis of left lower extremity Active Problems:   Fall at home, initial encounter   AKI (acute kidney injury) (HCC)   Rhabdomyolysis   Type 2 diabetes mellitus without complications (HCC)   Hypoglycemia   Essential hypertension   Thrombocytopenia (HCC)   Hypothyroidism   Dyslipidemia   Iron deficiency anemia   Abnormal LFTs   Schizophrenia, paranoid, chronic (HCC)   Long QT interval  Assessment and Plan:  Cellulitis of left lower extremity: continue on IV rocephin, vanco. Blood cxs NGTD. Wound care consulted. Continue on IVFs. Oxycodone, morphine prn for pain  Fall: no acute injury on images including CT head, CT of C-spine, x-ray of right foot. PT/OT consulted    DM2: well controlled, HbA1c 4.7. Diet controlled.    Hypoglycemia: changed IVFs to D5NS   AKI: Likely multifactorial etiology including dehydration, bactrim & lasix use. Holding lasix, bactrim. Continue on IVFs   HTN: BP is WNL. Holding lasix. IV hydralazine prn    Schizophrenia: paranoid & chronic. Continue on home dose of cogentin, olanzapine, hydroxyzine. Will restart home dose of depakote. Holding buspar & trazodone secondary to QTC prolongation. Abilify q28 days   Hypothyroidism: continue on home dose of synthroid    HLD: continue on fenofibrate. Holding statin    IDA: continue on iron supplement. No need for a transfusion currently    Rhabdomyolysis: CK 8800. Continue on IVFs. Holding statin    Thrombocytopenia: chronic. Will continue to monitor   Transaminitis: etiology unclear. Holding statin. Hepatitis panel is neg   Long QT interval: holding buspar, trazodone. No zofran        DVT prophylaxis: no lovenox or heparin secondary to thrombocytopenia (trending down) Code Status: full  Family Communication:  Disposition Plan: likely d/c  back to group home   Level of care: Telemetry Medical Status is: Inpatient Remains inpatient appropriate because: severity of illness, requiring IV abxs     Consultants:    Procedures:   Antimicrobials: rocephin, vanco    Subjective: Pt is lethargic   Objective: Vitals:   02/21/23 2121 02/22/23 0039 02/22/23 0540 02/22/23 0734  BP: 121/71 122/67 122/67 134/63  Pulse: 98 81 78 84  Resp: 18 17 18 18   Temp: 97.8 F (36.6 C) 98 F (36.7 C) 97.9 F (36.6 C) 97.6 F (36.4 C)  TempSrc: Oral Oral Oral   SpO2: 100% 98% 98% 100%  Weight:      Height:        Intake/Output Summary (Last 24 hours) at 02/22/2023 0802 Last data filed at 02/22/2023 0601 Gross per 24 hour  Intake 1205.27 ml  Output --  Net 1205.27 ml   Filed Weights   02/21/23 1424  Weight: 94.3 kg    Examination:  General exam: Appears lethargic  Respiratory system: Clear to auscultation. Respiratory effort normal. Cardiovascular system: S1 & S2+. No rubs, gallops or clicks.  Gastrointestinal system: Abdomen is nondistended, soft and nontender. Normal bowel sounds heard. Central nervous system: lethargic. Moves all extremities  Psychiatry: Judgement and insight appears poor. Flat mood and affect.     Data Reviewed: I have personally reviewed following labs and imaging studies  CBC: Recent Labs  Lab 02/21/23 1551 02/22/23 0449  WBC 5.9 4.0  HGB 10.5* 9.3*  HCT 30.3* 27.7*  MCV 84.2 85.5  PLT 69* 58*   Basic  Metabolic Panel: Recent Labs  Lab 02/21/23 1551 02/21/23 1607 02/22/23 0449  NA 136  --  138  K 3.6  --  3.2*  CL 96*  --  102  CO2 27  --  28  GLUCOSE 73  --  66*  BUN 42*  --  34*  CREATININE 2.20*  --  1.74*  CALCIUM 8.9  --  8.3*  MG  --  2.3  --    GFR: Estimated Creatinine Clearance: 60.7 mL/min (A) (by C-G formula based on SCr of 1.74 mg/dL (H)). Liver Function Tests: Recent Labs  Lab 02/21/23 1551  AST 325*  ALT 69*  ALKPHOS 57  BILITOT 1.3*  PROT 6.4*   ALBUMIN 3.6   No results for input(s): "LIPASE", "AMYLASE" in the last 168 hours. No results for input(s): "AMMONIA" in the last 168 hours. Coagulation Profile: No results for input(s): "INR", "PROTIME" in the last 168 hours. Cardiac Enzymes: Recent Labs  Lab 02/21/23 1607 02/22/23 0449  CKTOTAL 8,863* 3,737*   BNP (last 3 results) No results for input(s): "PROBNP" in the last 8760 hours. HbA1C: No results for input(s): "HGBA1C" in the last 72 hours. CBG: Recent Labs  Lab 02/22/23 0541 02/22/23 0641 02/22/23 0653 02/22/23 0706 02/22/23 0736  GLUCAP 78 67* 66* 76 102*   Lipid Profile: No results for input(s): "CHOL", "HDL", "LDLCALC", "TRIG", "CHOLHDL", "LDLDIRECT" in the last 72 hours. Thyroid Function Tests: No results for input(s): "TSH", "T4TOTAL", "FREET4", "T3FREE", "THYROIDAB" in the last 72 hours. Anemia Panel: No results for input(s): "VITAMINB12", "FOLATE", "FERRITIN", "TIBC", "IRON", "RETICCTPCT" in the last 72 hours. Sepsis Labs: Recent Labs  Lab 02/21/23 1551 02/21/23 2147  LATICACIDVEN 1.3 1.9    No results found for this or any previous visit (from the past 240 hour(s)).       Radiology Studies: CT Head Wo Contrast  Result Date: 02/21/2023 CLINICAL DATA:  Mental status change, unknown cause; Neck trauma, intoxicated or obtunded (Age >= 16y) fall EXAM: CT HEAD WITHOUT CONTRAST CT CERVICAL SPINE WITHOUT CONTRAST TECHNIQUE: Multidetector CT imaging of the head and cervical spine was performed following the standard protocol without intravenous contrast. Multiplanar CT image reconstructions of the cervical spine were also generated. RADIATION DOSE REDUCTION: This exam was performed according to the departmental dose-optimization program which includes automated exposure control, adjustment of the mA and/or kV according to patient size and/or use of iterative reconstruction technique. COMPARISON:  CT head 01/18/22 FINDINGS: CT HEAD FINDINGS Brain: No  evidence of acute infarction, hemorrhage, hydrocephalus, extra-axial collection or mass lesion/mass effect. Vascular: No hyperdense vessel or unexpected calcification. Skull: Normal. Negative for fracture or focal lesion. Sinuses/Orbits: No middle ear or mastoid effusion. Paranasal sinuses are clear. Orbits are right lens replacement. Other: None. CT CERVICAL SPINE FINDINGS Alignment: Trace anterolisthesis of C4 on C5. Skull base and vertebrae: No acute fracture. No primary bone lesion or focal pathologic process. Soft tissues and spinal canal: No prevertebral fluid or swelling. No visible canal hematoma. Disc levels:  No evidence high-grade spinal canal stenosis. Upper chest: Negative. Other: None IMPRESSION: 1. No acute intracranial abnormality. 2. No acute fracture or traumatic subluxation of the cervical spine. Electronically Signed   By: Lorenza Cambridge M.D.   On: 02/21/2023 18:16   CT Cervical Spine Wo Contrast  Result Date: 02/21/2023 CLINICAL DATA:  Mental status change, unknown cause; Neck trauma, intoxicated or obtunded (Age >= 16y) fall EXAM: CT HEAD WITHOUT CONTRAST CT CERVICAL SPINE WITHOUT CONTRAST TECHNIQUE: Multidetector CT imaging of the  head and cervical spine was performed following the standard protocol without intravenous contrast. Multiplanar CT image reconstructions of the cervical spine were also generated. RADIATION DOSE REDUCTION: This exam was performed according to the departmental dose-optimization program which includes automated exposure control, adjustment of the mA and/or kV according to patient size and/or use of iterative reconstruction technique. COMPARISON:  CT head 01/18/22 FINDINGS: CT HEAD FINDINGS Brain: No evidence of acute infarction, hemorrhage, hydrocephalus, extra-axial collection or mass lesion/mass effect. Vascular: No hyperdense vessel or unexpected calcification. Skull: Normal. Negative for fracture or focal lesion. Sinuses/Orbits: No middle ear or mastoid effusion.  Paranasal sinuses are clear. Orbits are right lens replacement. Other: None. CT CERVICAL SPINE FINDINGS Alignment: Trace anterolisthesis of C4 on C5. Skull base and vertebrae: No acute fracture. No primary bone lesion or focal pathologic process. Soft tissues and spinal canal: No prevertebral fluid or swelling. No visible canal hematoma. Disc levels:  No evidence high-grade spinal canal stenosis. Upper chest: Negative. Other: None IMPRESSION: 1. No acute intracranial abnormality. 2. No acute fracture or traumatic subluxation of the cervical spine. Electronically Signed   By: Lorenza Cambridge M.D.   On: 02/21/2023 18:16   US Venous Img Lower Bilateral  Result Date: 02/21/2023 CLINICAL DATA:  Bilateral lower extremity edema EXAM: Bilateral LOWER EXTREMITY VENOUS DOPPLER ULTRASOUND TECHNIQUE: Gray-scale sonography with compression, as well as color and duplex ultrasound, were performed to evaluate the deep venous system(s) from the level of the common femoral vein through the popliteal and proximal calf veins. COMPARISON:  None Available. FINDINGS: VENOUS Normal compressibility of the common femoral, superficial femoral, and popliteal veins, as well as the visualized calf veins. Visualized portions of profunda femoral vein and great saphenous vein unremarkable. No filling defects to suggest DVT on grayscale or color Doppler imaging. Doppler waveforms show normal direction of venous flow, normal respiratory plasticity and response to augmentation. Limited views of the contralateral common femoral vein are unremarkable. OTHER Bilateral calf edema. Limitations: none IMPRESSION: Negative for acute DVT. Electronically Signed   By: Minerva Fester M.D.   On: 02/21/2023 17:56   DG Foot 2 Views Right  Result Date: 02/21/2023 CLINICAL DATA:  Pain and bruised toes. EXAM: RIGHT FOOT - 2 VIEW COMPARISON:  None Available. FINDINGS: Two views of the right foot. No evidence of fracture or dislocation. There is no evidence of  arthropathy or other focal bone abnormality. Soft tissues are unremarkable. IMPRESSION: Negative right foot radiographs. Electronically Signed   By: Orvan Falconer M.D.   On: 02/21/2023 15:20        Scheduled Meds:  ascorbic acid  500 mg Oral Daily   aspirin EC  81 mg Oral Daily   benztropine  1 mg Oral BID   cholecalciferol  1,000 Units Oral Daily   fenofibrate  54 mg Oral Daily   folic acid  1 mg Oral Daily   iron polysaccharides  150 mg Oral Daily   levothyroxine  88 mcg Oral Q0600   OLANZapine  10 mg Oral QHS   OLANZapine  20 mg Oral QHS   polyethylene glycol  17 g Oral Daily   senna  2 tablet Oral QHS   vancomycin variable dose per unstable renal function (pharmacist dosing)   Does not apply See admin instructions   Continuous Infusions:  cefTRIAXone (ROCEPHIN)  IV Stopped (02/21/23 1910)   dextrose 5 % and 0.9 % NaCl       LOS: 1 day    Time spent: 35 mins  Charise Killian, MD [facility] Pager [phone]  If 7PM-7AM, please contact night-coverage [email or web address] 02/22/2023, 8:02 AM

## 2023-02-22 NOTE — [place] FL2 (Addendum)
[place] MEDICAID FL2 LEVEL OF CARE FORM     IDENTIFICATION  Patient Name: Derrick Atkins Birthdate: August 17, 1970 Sex: male Admission Date (Current Location): 02/21/2023  [facility] and [place] Number:  Chiropodist and Address:  [facility], [address], [place], [place] [address]      Provider Number: [id number]  Attending Physician Name and Address:  Charise Killian, MD  Relative Name and Phone Number:  Lenn Sink (Legal Guardian)  [phone] (    Current Level of Care: Hospital Recommended Level of Care: Skilled Nursing Facility Prior Approval Number:    Date Approved/Denied:   PASRR Number: pending  Discharge Plan:      Current Diagnoses: Patient Active Problem List   Diagnosis Date Noted   Thrombocytopenia (HCC) 02/21/2023   Rhabdomyolysis 02/21/2023   Hypoglycemia 02/21/2023   Abnormal LFTs 02/21/2023   Fall at home, initial encounter 02/21/2023   Cellulitis of left lower extremity 01/06/2023   Iron deficiency anemia 01/06/2023   Cellulitis and abscess of left lower extremity 01/06/2023   Nausea & vomiting 01/06/2023   AKI (acute kidney injury) (HCC) 12/13/2022   Dyslipidemia 12/13/2022   Schizophrenia (HCC) 03/23/2022   Aggressive behavior    Schizophrenia, paranoid, chronic (HCC)    Long QT interval 04/16/2014   Hypothyroidism 12/25/2013   Essential hypertension 05/03/2007   Type 2 diabetes mellitus without complications (HCC) 05/03/2007    Orientation RESPIRATION BLADDER Height & Weight     Time, Self, Situation, Place    Incontinent Weight: 207 lb 14.3 oz (94.3 kg) Height:  6\' 3"  (190.5 cm)  BEHAVIORAL SYMPTOMS/MOOD NEUROLOGICAL BOWEL NUTRITION STATUS      Incontinent, Continent    AMBULATORY STATUS COMMUNICATION OF NEEDS Skin   Limited Assist Verbally  (Pretibial leg wound)                       Personal Care Assistance Level of Assistance  Bathing, Feeding, Dressing Bathing Assistance:  Limited assistance Feeding assistance: Independent Dressing Assistance: Limited assistance     Functional Limitations Info  Sight, Hearing, Speech Sight Info: Adequate Hearing Info: Adequate Speech Info: Adequate    SPECIAL CARE FACTORS FREQUENCY  PT (By licensed PT), OT (By licensed OT)     PT Frequency: 5 times a week OT Frequency: 5 times a week            Contractures Contractures Info: Not present    Additional Factors Info  Code Status, Allergies Code Status Info: FULL Allergies Info: Phenytoin Sodium Extended  Prednisone  Latex           Current Medications (02/22/2023):  This is the current hospital active medication list Current Facility-Administered Medications  Medication Dose Route Frequency Provider Last Rate Last Admin   albuterol (PROVENTIL) (2.5 MG/3ML) 0.083% nebulizer solution 2.5 mg  2.5 mg Inhalation Q4H PRN Lorretta Harp, MD       ascorbic acid (VITAMIN C) tablet 500 mg  500 mg Oral Daily Lorretta Harp, MD   500 mg at 02/22/23 8413   aspirin EC tablet 81 mg  81 mg Oral Daily Lorretta Harp, MD   81 mg at 02/22/23 0914   benztropine (COGENTIN) tablet 1 mg  1 mg Oral BID Lorretta Harp, MD   1 mg at 02/22/23 0912   cefTRIAXone (ROCEPHIN) 2 g in sodium chloride 0.9 % 100 mL IVPB  2 g Intravenous Q24H Sharman Cheek, MD   Stopped at 02/21/23 1910   cholecalciferol (  VITAMIN D3) 25 MCG (1000 UNIT) tablet 1,000 Units  1,000 Units Oral Daily Lorretta Harp, MD   1,000 Units at 02/22/23 0914   dextrose 5 %-0.9 % sodium chloride infusion   Intravenous Continuous Charise Killian, MD 100 mL/hr at 02/22/23 0911 New Bag at 02/22/23 0911   dextrose 50 % solution 50 mL  50 mL Intravenous PRN Lorretta Harp, MD       diphenhydrAMINE (BENADRYL) injection 12.5 mg  12.5 mg Intravenous Q8H PRN Lorretta Harp, MD       divalproex (DEPAKOTE) DR tablet 750 mg  750 mg Oral Q12H Charise Killian, MD   750 mg at 02/22/23 1427   fenofibrate tablet 54 mg  54 mg Oral Daily Lorretta Harp, MD   54 mg  at 02/22/23 7564   folic acid (FOLVITE) tablet 1 mg  1 mg Oral Daily Lorretta Harp, MD   1 mg at 02/22/23 3329   hydrALAZINE (APRESOLINE) injection 5 mg  5 mg Intravenous Q2H PRN Lorretta Harp, MD       hydrOXYzine (ATARAX) tablet 50 mg  50 mg Oral Daily PRN Lorretta Harp, MD       iron polysaccharides (NIFEREX) capsule 150 mg  150 mg Oral Daily Lorretta Harp, MD   150 mg at 02/22/23 0914   levothyroxine (SYNTHROID) tablet 88 mcg  88 mcg Oral Q0600 Lorretta Harp, MD   88 mcg at 02/22/23 0518   morphine (PF) 2 MG/ML injection 1 mg  1 mg Intravenous Q4H PRN Charise Killian, MD       OLANZapine (ZYPREXA) tablet 10 mg  10 mg Oral QHS Lorretta Harp, MD   10 mg at 02/21/23 2149   OLANZapine (ZYPREXA) tablet 20 mg  20 mg Oral QHS Lorretta Harp, MD   20 mg at 02/21/23 2151   oxyCODONE (Oxy IR/ROXICODONE) immediate release tablet 5 mg  5 mg Oral Q6H PRN Lorretta Harp, MD       polyethylene glycol (MIRALAX / GLYCOLAX) packet 17 g  17 g Oral Daily Lorretta Harp, MD       polyvinyl alcohol (LIQUIFILM TEARS) 1.4 % ophthalmic solution 1 drop  1 drop Both Eyes TID PRN Lorretta Harp, MD       senna (SENOKOT) tablet 17.2 mg  2 tablet Oral QHS Lorretta Harp, MD   17.2 mg at 02/21/23 2149     Discharge Medications: Please see discharge summary for a list of discharge medications.  Relevant Imaging Results:  Relevant Lab Results:   Additional Information SS #: [id number]   , LCSW

## 2023-02-22 NOTE — Evaluation (Signed)
Occupational Therapy Evaluation Patient Details Name: Derrick Atkins MRN: [id number] DOB: 1971/06/19 Today's Date: 02/22/2023   History of Present Illness Pt is a 52 y.o. male with medical history significant of schizophrenia, hypertension, hyperlipidemia, diabetes mellitus, hypothyroidism, anemia, cerebral palsy, aggressive behavior, thrombocytopenia, left lower leg cellulitis with abscess, who presents with pain in left lower leg. Fell and hurt RLE.   Clinical Impression   Pt was seen for OT evaluation this date. Prior to hospital admission, pt reports he was using a walker for mobility, endorses several falls due to mopped floors at this group home, and denies assist for ADL tasks. When asked if the pt had assist from the group home for medication management, pt stated, "I think so." Pt endorses discomfort with BLE, R heel and great toe painful to touch/light pressure. Pt educated in positioning for BLE to float heels, pillows positioned and pt noting improved comfort. Pt required setup for snack items, setup for washing his face, and anticipate MAX A for LB ADL tasks. Pt required assist for ordering lunch. Pt able to scan menu and identify items he preferred however required VC to open menu completely in order to see the lunch/dinner items and not just the breakfast items. When attempting to dial the phone number pt demonstrated repeated difficulty with dialing the correct number despite visual aid. Pt required 1 step commands to successfully turn phone on and then dial correct number broken into first 3 digits, then 2 +2 digits to ensure accuracy with pressing correct buttons.  Pt presents to acute OT demonstrating impaired ADL performance and functional mobility 2/2 cognition, impaired strength, activity tolerance, and BLE pain (See OT problem list for additional functional deficits).  Pt would benefit from skilled OT services to address noted impairments and functional limitations (see below for  any additional details) in order to maximize safety and independence while minimizing falls risk and caregiver burden.     If plan is discharge home, recommend the following: Two people to help with walking and/or transfers;A lot of help with bathing/dressing/bathroom;Assistance with feeding;Help with stairs or ramp for entrance;Assist for transportation;Direct supervision/assist for medications management;Assistance with cooking/housework    Functional Status Assessment  Patient has had a recent decline in their functional status and demonstrates the ability to make significant improvements in function in a reasonable and predictable amount of time.  Equipment Recommendations  Other (comment);BSC/3in1 (defer to next venue, anticipate need for 2WW and [facility])    Recommendations for Other Services       Precautions / Restrictions Precautions Precautions: Fall Restrictions Weight Bearing Restrictions: No      Mobility Bed Mobility               General bed mobility comments: deferred 2/2 BLE pain/discomfort    Transfers                          Balance                                           ADL either performed or assessed with clinical judgement   ADL                                         General ADL Comments: Pt  currently requiring MAX A for LB ADL, anticipate MAX A +2 for bed mobility, setup for grooming long sitting in bed, setup assist for self feeding, and anticipate MOD A for toilet transfers.     Vision         Perception         Praxis         Pertinent Vitals/Pain Pain Assessment Pain Assessment: Faces Faces Pain Scale: Hurts a little bit Pain Location: R heel with pressure Pain Descriptors / Indicators: Guarding, Aching, Grimacing Pain Intervention(s): Limited activity within patient's tolerance, Monitored during session, Repositioned     Extremity/Trunk Assessment Upper Extremity  Assessment Upper Extremity Assessment: Generalized weakness;Left hand dominant (RUE weaker than LUE pt citing recent fall as reason, pain limited on R side)   Lower Extremity Assessment Lower Extremity Assessment: Defer to PT evaluation;Generalized weakness (LLE bandaged, pt noting pain on R heel with pressure, also R great toe red/purple, appears somewhat swollen, and painful to touch. Pt reports RN is aware.)       Communication Communication Communication: Difficulty communicating thoughts/reduced clarity of speech;Difficulty following commands/understanding Following commands: Follows one step commands inconsistently;Follows one step commands with increased time Cueing Techniques: Verbal cues;Tactile cues   Cognition Arousal: Alert Behavior During Therapy: Flat affect Overall Cognitive Status: No family/caregiver present to determine baseline cognitive functioning                                 General Comments: Pt alert and oriented x4, slow processing, some difficulty with recall and scattered thoughts requiring additional VC during session     General Comments       Exercises Other Exercises Other Exercises: Pt required assist for ordering lunch. Pt able to scan menu and identify items he preferred however required VC to open menu completely in order to see the lunch/dinner items and not just the breakfast items. When attempting to dial the phone number pt demonstrated repeated difficulty with dialing the correct number despite visual aid. Pt required 1 step commands to successfully turn phone on and then dial correct number broken into first 3 digits, then 2 +2 digits to ensure accuracy with pressing correct buttons. Other Exercises: Pt educated in positioning for improved comfort for BLE to float heels with pillows positioned.   Shoulder Instructions      Home Living Family/patient expects to be discharged to:: Group home Living Arrangements: Group  Home Available Help at Discharge: Available PRN/intermittently Type of Home: Group Home Home Access: Level entry     Home Layout: One level     Bathroom Shower/Tub: Chief Strategy Officer: Standard     Home Equipment: Agricultural consultant (2 wheels);Grab bars - tub/shower   Additional Comments: Pt has difficulty detailing home set-up but is able to answer to yes/no questions.      Prior Functioning/Environment Prior Level of Function : Needs assist             Mobility Comments: Use of RW in the community, hx of several falls, citing the floors being slick after mopping as reason for falls ADLs Comments: Pt reports doing ADLs independently with no support        OT Problem List: Decreased strength;Pain;Decreased cognition;Decreased safety awareness;Decreased activity tolerance;Impaired balance (sitting and/or standing);Decreased knowledge of use of DME or AE      OT Treatment/Interventions: Self-care/ADL training;Therapeutic exercise;Therapeutic activities;Cognitive remediation/compensation;DME and/or AE instruction;Patient/family education;Balance training  OT Goals(Current goals can be found in the care plan section) Acute Rehab OT Goals Patient Stated Goal: be in a good mood again OT Goal Formulation: With patient Time For Goal Achievement: 03/08/23 Potential to Achieve Goals: Good ADL Goals Pt Will Perform Grooming: sitting;with supervision;with set-up Pt Will Perform Lower Body Dressing: sit to/from stand;with mod assist Pt Will Transfer to Toilet: with mod assist;bedside commode;stand pivot transfer (LRAD) Pt Will Perform Toileting - Clothing Manipulation and hygiene: sitting/lateral leans;with contact guard assist;with set-up Additional ADL Goal #1: Pt will verbalize plan to implement at least 1 learned falls prevention strategy to maximize safety.  OT Frequency: Min 1X/week    Co-evaluation              AM-PAC OT "6 Clicks" Daily Activity      Outcome Measure Help from another person eating meals?: None Help from another person taking care of personal grooming?: A Little Help from another person toileting, which includes using toliet, bedpan, or urinal?: A Lot Help from another person bathing (including washing, rinsing, drying)?: A Lot Help from another person to put on and taking off regular upper body clothing?: A Lot Help from another person to put on and taking off regular lower body clothing?: A Lot 6 Click Score: 15   End of Session    Activity Tolerance: Patient tolerated treatment well Patient left: in bed;with call bell/phone within reach;with bed alarm set  OT Visit Diagnosis: Other abnormalities of gait and mobility (R26.89);Repeated falls (R29.6);Muscle weakness (generalized) (M62.81);Pain Pain - Right/Left: Right Pain - part of body: Ankle and joints of foot                Time: 1433-1500 OT Time Calculation (min): 27 min Charges:  OT General Charges $OT Visit: 1 Visit OT Evaluation $OT Eval Moderate Complexity: 1 Mod OT Treatments $Self Care/Home Management : 8-22 mins  Arman Filter., MPH, MS, OTR/L ascom [phone] 02/22/23, 3:16 PM

## 2023-02-22 NOTE — Plan of Care (Signed)
  Problem: Activity: Goal: Risk for activity intolerance will decrease Outcome: Progressing   Problem: Nutrition: Goal: Adequate nutrition will be maintained Outcome: Progressing   Problem: Elimination: Goal: Will not experience complications related to bowel motility Outcome: Progressing Goal: Will not experience complications related to urinary retention Outcome: Progressing   Problem: Pain Managment: Goal: General experience of comfort will improve Outcome: Progressing   Problem: Safety: Goal: Ability to remain free from injury will improve Outcome: Progressing   

## 2023-02-23 DIAGNOSIS — L03116 Cellulitis of left lower limb: Secondary | ICD-10-CM | POA: Diagnosis not present

## 2023-02-23 LAB — GLUCOSE, CAPILLARY
Glucose-Capillary: 107 mg/dL — ABNORMAL HIGH (ref 70–99)
Glucose-Capillary: 79 mg/dL (ref 70–99)
Glucose-Capillary: 95 mg/dL (ref 70–99)
Glucose-Capillary: 86 mg/dL (ref 70–99)
Glucose-Capillary: 61 mg/dL — ABNORMAL LOW (ref 70–99)

## 2023-02-23 MED ORDER — POTASSIUM CHLORIDE CRYS ER 20 MEQ PO TBCR
40.0000 meq | EXTENDED_RELEASE_TABLET | Freq: Once | ORAL | Status: AC
  Administered 2023-02-23: 40 meq via ORAL
  Filled 2023-02-23: qty 2

## 2023-02-23 MED ORDER — OLANZAPINE 10 MG PO TABS
30.0000 mg | ORAL_TABLET | Freq: Every day | ORAL | Status: DC
  Administered 2023-02-23 – 2023-02-27 (×5): 30 mg via ORAL
  Filled 2023-02-23 (×5): qty 3

## 2023-02-23 MED ORDER — POTASSIUM CHLORIDE CRYS ER 20 MEQ PO TBCR
40.0000 meq | EXTENDED_RELEASE_TABLET | Freq: Once | ORAL | Status: DC
  Filled 2023-02-23: qty 2

## 2023-02-23 NOTE — Progress Notes (Signed)
   02/23/23 2000  Spiritual Encounters  Type of Visit Initial  Care provided to: Patient  Conversation partners present during encounter Nurse  Referral source Patient request  Reason for visit Urgent spiritual support  OnCall Visit Yes   This Derrick Atkins responded to page. Pt expressed struggle to understand why God cannot reach down and touch the "good people on earth" He states feeling like Jonah running away from God but also feeling like "Job" because of his issues in his life. Chap provided reflective listening as well as a prayer as requested by the Patient.

## 2023-02-23 NOTE — Plan of Care (Signed)
Patient alert oriented to self and place. Took all scheduled medications and repositioned regularly. PT visit stand pivot to [facility]. In bed locked in lowest position, nursing will continue to monitor.  Problem: Education: Goal: Knowledge of General Education information will improve Description: Including pain rating scale, medication(s)/side effects and non-pharmacologic comfort measures Outcome: Progressing   Problem: Clinical Measurements: Goal: Ability to maintain clinical measurements within normal limits will improve Outcome: Progressing Goal: Will remain free from infection Outcome: Progressing   Problem: Nutrition: Goal: Adequate nutrition will be maintained Outcome: Progressing

## 2023-02-23 NOTE — Plan of Care (Signed)

## 2023-02-23 NOTE — Progress Notes (Signed)
PROGRESS NOTE   HPI was taken from Dr. Clyde Lundborg: Derrick Atkins is a 52 y.o. male with medical history significant of schizophrenia, hypertension, hyperlipidemia, diabetes mellitus, hypothyroidism, anemia, cerebral palsy, aggressive behavior, thrombocytopenia, left lower leg cellulitis with abscess, who presents with pain in left lower leg.    Patient had history of left lower leg cellulitis with abscess, and admitted twice in June. Patient was s/p of I&D. Pt has worsening pain in left lower leg in the past several days. His left lower leg is swollen and erythematous.  His pain seems to be at least moderate.  He has multiple small wounds in left lower leg. Patient has been taking Bactrim since 8/9 without improvement. He is lethargic today, cannot provide detailed information to further characterize his pain.  Patient falls asleep quickly during the interview, but is easily arousable.  He is orientated x 3 and moves all extremities. No fever or chills.  Patient has generalized weakness.  Patient has poor appetite and decreased oral intake.  Patient fell today.  No head or neck injury.  He injured his right foot, with some right foot pain.  No chest pain, cough, shortness of breath.  No active nausea, vomiting, diarrhea or abdominal pain.    Data reviewed independently and ED Course: pt was found to have WBC 5.9, lactic acid 1.3, negative urinalysis, CK 80 800, AKI with creatinine 2.20, BUN 42, GFR 36 (baseline creatinine 0.87 on 12/23/2022), abnormal liver function (ALP 57, AST 325, ALT 69, total bilirubin 1.3), Tylenol level less than 10, salicylate level less than 7, magnesium 2.3, valproic acid level 105 which is slightly supratherapeutic. X-ray of right foot is negative for fracture.  CT of head and CT of C-spine negative for acute issues.  Lower extremity venous Doppler negative for DVT.  Patient is admitted to telemetry bed as inpatient    Derrick Atkins  OAC:[id number] DOB: 03-Oct-1970 DOA:  02/21/2023 PCP: Gracelyn Nurse, MD   Assessment & Plan:   Principal Problem:   Cellulitis of left lower extremity Active Problems:   Fall at home, initial encounter   AKI (acute kidney injury) (HCC)   Rhabdomyolysis   Type 2 diabetes mellitus without complications (HCC)   Hypoglycemia   Essential hypertension   Thrombocytopenia (HCC)   Hypothyroidism   Dyslipidemia   Iron deficiency anemia   Abnormal LFTs   Schizophrenia, paranoid, chronic (HCC)   Long QT interval  Assessment and Plan:  Cellulitis of left lower extremity: continue on IV rocephin. D/c vanco. Continue w/ wound care.Oxycodone, morphine prn for pain  Fall: no acute injury on images including CT head, CT of C-spine, x-ray of right foot. PT/OT recs SNF    DM2: HbA1c 4.7, well controlled. Diet constolled    Hypoglycemia: no hypoglycemic episodes today so far. Continue on D5NS  AKI: Likely multifactorial etiology including rhabdomyolysis, dehydration, bactrim & lasix use. Holding lasix, bactrim. Continue on IVFs  Rhabdomyolysis: traumatic & secondary to fall. CK is still elevated but trending down. Continue on IVFs    HTN: BP is WNL. Holding lasix. IV hydralazine prn    Schizophrenia: paranoid & chronic. Continue on home dose of depakote, cogentin, olanzapine, hydroxyzine. Abilify Q28 days   Hypothyroidism: continue on levothyroxine    HLD: continue on fenofibrate. Holding statin  continue on fenofibrate. Holding statin    IDA: continue on iron supplement   Thrombocytopenia: chronic. Trending down. Will continue to monitor   Transaminitis: etiology unclear. Hepatitis panel is neg. Holding statin  Long QT interval: holding buspar, trazodone. No zofran        DVT prophylaxis: no lovenox or heparin secondary to thrombocytopenia (trending down) Code Status: full  Family Communication:  Disposition Plan: likely d/c back to group home   Level of care: Telemetry Medical Status is: Inpatient Remains  inpatient appropriate because: severity of illness, requiring IV abxs     Consultants:    Procedures:   Antimicrobials: rocephin   Subjective: Pt c/o leg pain    Objective: Vitals:   02/22/23 1942 02/23/23 0007 02/23/23 0434 02/23/23 0807  BP: (!) 108/56 125/60 (!) 110/58 (!) 108/54  Pulse: 72 82 83 75  Resp: 19  18 16   Temp: 98.2 F (36.8 C) 98.4 F (36.9 C) 98.3 F (36.8 C) 97.7 F (36.5 C)  TempSrc: Oral Oral Oral Oral  SpO2: 98% 99% 97% 98%  Weight:      Height:        Intake/Output Summary (Last 24 hours) at 02/23/2023 0808 Last data filed at 02/23/2023 0547 Gross per 24 hour  Intake 578.13 ml  Output 900 ml  Net -321.87 ml   Filed Weights   02/21/23 1424  Weight: 94.3 kg    Examination:  General exam: Appears calm & comfortable  Respiratory system: decreased breath sounds b/l  Cardiovascular system: S1/S2+. No rubs or clicks.  Gastrointestinal system: Abd is soft, NT, ND & hypoactive bowel sounds Central nervous system: Awake and alert. Moves all extremities  Psychiatry: judgement and insight appears improved. Flat mood and affect     Data Reviewed: I have personally reviewed following labs and imaging studies  CBC: Recent Labs  Lab 02/21/23 1551 02/22/23 0449  WBC 5.9 4.0  HGB 10.5* 9.3*  HCT 30.3* 27.7*  MCV 84.2 85.5  PLT 69* 58*   Basic Metabolic Panel: Recent Labs  Lab 02/21/23 1551 02/21/23 1607 02/22/23 0449  NA 136  --  138  K 3.6  --  3.2*  CL 96*  --  102  CO2 27  --  28  GLUCOSE 73  --  66*  BUN 42*  --  34*  CREATININE 2.20*  --  1.74*  CALCIUM 8.9  --  8.3*  MG  --  2.3  --    GFR: Estimated Creatinine Clearance: 60.7 mL/min (A) (by C-G formula based on SCr of 1.74 mg/dL (H)). Liver Function Tests: Recent Labs  Lab 02/21/23 1551  AST 325*  ALT 69*  ALKPHOS 57  BILITOT 1.3*  PROT 6.4*  ALBUMIN 3.6   No results for input(s): "LIPASE", "AMYLASE" in the last 168 hours. No results for input(s): "AMMONIA" in  the last 168 hours. Coagulation Profile: No results for input(s): "INR", "PROTIME" in the last 168 hours. Cardiac Enzymes: Recent Labs  Lab 02/21/23 1607 02/22/23 0449  CKTOTAL 8,863* 3,737*   BNP (last 3 results) No results for input(s): "PROBNP" in the last 8760 hours. HbA1C: No results for input(s): "HGBA1C" in the last 72 hours. CBG: Recent Labs  Lab 02/22/23 1714 02/22/23 1944 02/22/23 2338 02/23/23 0436 02/23/23 0806  GLUCAP 101* 102* 123* 107* 79   Lipid Profile: No results for input(s): "CHOL", "HDL", "LDLCALC", "TRIG", "CHOLHDL", "LDLDIRECT" in the last 72 hours. Thyroid Function Tests: No results for input(s): "TSH", "T4TOTAL", "FREET4", "T3FREE", "THYROIDAB" in the last 72 hours. Anemia Panel: No results for input(s): "VITAMINB12", "FOLATE", "FERRITIN", "TIBC", "IRON", "RETICCTPCT" in the last 72 hours. Sepsis Labs: Recent Labs  Lab 02/21/23 1551 02/21/23 2147  LATICACIDVEN 1.3  1.9    No results found for this or any previous visit (from the past 240 hour(s)).       Radiology Studies: CT Head Wo Contrast  Result Date: 02/21/2023 CLINICAL DATA:  Mental status change, unknown cause; Neck trauma, intoxicated or obtunded (Age >= 16y) fall EXAM: CT HEAD WITHOUT CONTRAST CT CERVICAL SPINE WITHOUT CONTRAST TECHNIQUE: Multidetector CT imaging of the head and cervical spine was performed following the standard protocol without intravenous contrast. Multiplanar CT image reconstructions of the cervical spine were also generated. RADIATION DOSE REDUCTION: This exam was performed according to the departmental dose-optimization program which includes automated exposure control, adjustment of the mA and/or kV according to patient size and/or use of iterative reconstruction technique. COMPARISON:  CT head 01/18/22 FINDINGS: CT HEAD FINDINGS Brain: No evidence of acute infarction, hemorrhage, hydrocephalus, extra-axial collection or mass lesion/mass effect. Vascular: No  hyperdense vessel or unexpected calcification. Skull: Normal. Negative for fracture or focal lesion. Sinuses/Orbits: No middle ear or mastoid effusion. Paranasal sinuses are clear. Orbits are right lens replacement. Other: None. CT CERVICAL SPINE FINDINGS Alignment: Trace anterolisthesis of C4 on C5. Skull base and vertebrae: No acute fracture. No primary bone lesion or focal pathologic process. Soft tissues and spinal canal: No prevertebral fluid or swelling. No visible canal hematoma. Disc levels:  No evidence high-grade spinal canal stenosis. Upper chest: Negative. Other: None IMPRESSION: 1. No acute intracranial abnormality. 2. No acute fracture or traumatic subluxation of the cervical spine. Electronically Signed   By: Lorenza Cambridge M.D.   On: 02/21/2023 18:16   CT Cervical Spine Wo Contrast  Result Date: 02/21/2023 CLINICAL DATA:  Mental status change, unknown cause; Neck trauma, intoxicated or obtunded (Age >= 16y) fall EXAM: CT HEAD WITHOUT CONTRAST CT CERVICAL SPINE WITHOUT CONTRAST TECHNIQUE: Multidetector CT imaging of the head and cervical spine was performed following the standard protocol without intravenous contrast. Multiplanar CT image reconstructions of the cervical spine were also generated. RADIATION DOSE REDUCTION: This exam was performed according to the departmental dose-optimization program which includes automated exposure control, adjustment of the mA and/or kV according to patient size and/or use of iterative reconstruction technique. COMPARISON:  CT head 01/18/22 FINDINGS: CT HEAD FINDINGS Brain: No evidence of acute infarction, hemorrhage, hydrocephalus, extra-axial collection or mass lesion/mass effect. Vascular: No hyperdense vessel or unexpected calcification. Skull: Normal. Negative for fracture or focal lesion. Sinuses/Orbits: No middle ear or mastoid effusion. Paranasal sinuses are clear. Orbits are right lens replacement. Other: None. CT CERVICAL SPINE FINDINGS Alignment: Trace  anterolisthesis of C4 on C5. Skull base and vertebrae: No acute fracture. No primary bone lesion or focal pathologic process. Soft tissues and spinal canal: No prevertebral fluid or swelling. No visible canal hematoma. Disc levels:  No evidence high-grade spinal canal stenosis. Upper chest: Negative. Other: None IMPRESSION: 1. No acute intracranial abnormality. 2. No acute fracture or traumatic subluxation of the cervical spine. Electronically Signed   By: Lorenza Cambridge M.D.   On: 02/21/2023 18:16   US Venous Img Lower Bilateral  Result Date: 02/21/2023 CLINICAL DATA:  Bilateral lower extremity edema EXAM: Bilateral LOWER EXTREMITY VENOUS DOPPLER ULTRASOUND TECHNIQUE: Gray-scale sonography with compression, as well as color and duplex ultrasound, were performed to evaluate the deep venous system(s) from the level of the common femoral vein through the popliteal and proximal calf veins. COMPARISON:  None Available. FINDINGS: VENOUS Normal compressibility of the common femoral, superficial femoral, and popliteal veins, as well as the visualized calf veins. Visualized portions of profunda femoral  vein and great saphenous vein unremarkable. No filling defects to suggest DVT on grayscale or color Doppler imaging. Doppler waveforms show normal direction of venous flow, normal respiratory plasticity and response to augmentation. Limited views of the contralateral common femoral vein are unremarkable. OTHER Bilateral calf edema. Limitations: none IMPRESSION: Negative for acute DVT. Electronically Signed   By: Minerva Fester M.D.   On: 02/21/2023 17:56   DG Foot 2 Views Right  Result Date: 02/21/2023 CLINICAL DATA:  Pain and bruised toes. EXAM: RIGHT FOOT - 2 VIEW COMPARISON:  None Available. FINDINGS: Two views of the right foot. No evidence of fracture or dislocation. There is no evidence of arthropathy or other focal bone abnormality. Soft tissues are unremarkable. IMPRESSION: Negative right foot radiographs.  Electronically Signed   By: Orvan Falconer M.D.   On: 02/21/2023 15:20        Scheduled Meds:  ascorbic acid  500 mg Oral Daily   aspirin EC  81 mg Oral Daily   benztropine  1 mg Oral BID   cholecalciferol  1,000 Units Oral Daily   divalproex  750 mg Oral Q12H   fenofibrate  54 mg Oral Daily   folic acid  1 mg Oral Daily   iron polysaccharides  150 mg Oral Daily   levothyroxine  88 mcg Oral Q0600   OLANZapine  10 mg Oral QHS   OLANZapine  20 mg Oral QHS   polyethylene glycol  17 g Oral Daily   senna  2 tablet Oral QHS   Continuous Infusions:  cefTRIAXone (ROCEPHIN)  IV 2 g (02/22/23 1813)   dextrose 5 % and 0.9 % NaCl 100 mL/hr at 02/23/23 0545     LOS: 2 days    Time spent: 35 mins     Charise Killian, MD [facility] Pager [phone]  If 7PM-7AM, please contact night-coverage [email or web address] 02/23/2023, 8:08 AM

## 2023-02-23 NOTE — Progress Notes (Signed)
Per Dr Mayford Knife, dc tele monitoring

## 2023-02-23 NOTE — TOC Progression Note (Signed)
Transition of Care [facility]) - Progression Note    Patient Details  Name: Derrick Atkins MRN: [id number] Date of Birth: 02/27/1971  [facility]) CM/SW Contact  Allena Katz, LCSW Phone Number: 02/23/2023, 3:43 PM  Clinical Narrative:   CSW left voice mail for guardian candace to discuss bed offer.         Expected Discharge Plan and Services                                               Social Determinants of Health (SDOH) Interventions SDOH Screenings   Food Insecurity: No Food Insecurity (02/21/2023)  Recent Concern: Food Insecurity - Food Insecurity Present (01/06/2023)  Housing: Low Risk  (02/21/2023)  Transportation Needs: Unmet Transportation Needs (02/21/2023)  Utilities: Not At Risk (02/21/2023)  Alcohol Screen: Low Risk  (03/23/2022)  Tobacco Use: Low Risk  (02/21/2023)    Readmission Risk Interventions     No data to display

## 2023-02-23 NOTE — Progress Notes (Signed)
Physical Therapy Treatment Patient Details Name: Derrick Atkins MRN: [id number] DOB: 1971/05/29 Today's Date: 02/23/2023   History of Present Illness Pt is a 52 y.o. male with medical history significant of schizophrenia, hypertension, hyperlipidemia, diabetes mellitus, hypothyroidism, anemia, cerebral palsy, aggressive behavior, thrombocytopenia, left lower leg cellulitis with abscess, who presents with pain in left lower leg. Multiple recent falls.    PT Comments  Pt pleasant and eager to work with PT.  He struggled with bed mobility, but showed improved effort and decreased assist requirement with multiple sit to stand efforts (bed, recliner, BSC) as well as showing the ability to maintain "prolonged" time in standing with repeated forward/backward ambulation with heavy UE reliance on walker but no buckling or overt LOBs issues.  Pt lacks TKE b/l, encouraged quad sets and purposeful positioning to facilitate passive ROM/stretch.  Pt with nice improvement, still with functional limitations needing skilled PT, continue with POC.    If plan is discharge home, recommend the following: A lot of help with walking and/or transfers;A lot of help with bathing/dressing/bathroom;Assistance with cooking/housework;Direct supervision/assist for medications management;Assist for transportation;Help with stairs or ramp for entrance   Can travel by private vehicle     Yes  Equipment Recommendations  None recommended by PT    Recommendations for Other Services       Precautions / Restrictions Precautions Precautions: Fall Restrictions Weight Bearing Restrictions: No     Mobility  Bed Mobility Overal bed mobility: Needs Assistance Bed Mobility: Supine to Sit     Supine to sit: Mod assist     General bed mobility comments: Pt showed good effort trying to get to sitting EOB but ultimately struggled to roll toward side or functionally move LE.  Pt assist with LE toward EOB and with HHA he was  able to effortfully pull himself to to sitting.    Transfers Overall transfer level: Needs assistance Equipment used: Rolling walker (2 wheels) Transfers: Sit to/from Stand, Bed to chair/wheelchair/BSC Sit to Stand: Mod assist Stand pivot transfers: Min assist         General transfer comment: pt unable to rise on his own power from standard height bed, however with plenty of encouargement and cuing for set up/sequencing he was able to rise with modA.  Pt did rise from recliner (pillow for raised surface) and later the BCS with heavy b/l UE use but only incidental minA to insure continuance of forward/upward shift.    Ambulation/Gait Ambulation/Gait assistance: Contact guard assist, Min assist Gait Distance (Feet): 25 Feet Assistive device: Rolling walker (2 wheels)         General Gait Details: Pt was able to do 3 bouts of forward/backward stepping X6 w/o rest break.  Pt unable to achieve TKE in either knee but did not have any overt buckling.  Pt with some fatigue with the effort but overall felt good and did not need direct intervention for LOBs or overt unsteadiness.  Pt also took a few steps to and from [facility] with the walker, not needing direct assist.   Stairs             Wheelchair Mobility     Tilt Bed    Modified Rankin (Stroke Patients Only)       Balance Overall balance assessment: Needs assistance Sitting-balance support: Feet supported, Single extremity supported Sitting balance-Leahy Scale: Good       Standing balance-Leahy Scale: Fair Standing balance comment: Able to stand with BUE support on RW and PT but  requires mod A x1 for safety and generalized BLE weakness                            Cognition Arousal: Alert Behavior During Therapy: WFL for tasks assessed/performed Overall Cognitive Status: Within Functional Limits for tasks assessed                                 General Comments: Pt alert and oriented x4,  slow processing, some difficulty with recall and scattered thoughts requiring additional VC during session        Exercises Other Exercises Other Exercises: performed and educated on Quad sets as b/l knees lack TKE ROM (c/o h/s and posterior knee pain with PROM nearing extension).  On arrival pt with b/l knees propped on pillows, educated on passive stretch/positioning to encourge TKE and appropraite resting LE postures    General Comments        Pertinent Vitals/Pain Pain Assessment Pain Assessment: 0-10 Pain Location: minimal R leg/foot pain    Home Living                          Prior Function            PT Goals (current goals can now be found in the care plan section) Progress towards PT goals: Progressing toward goals    Frequency    Min 1X/week      PT Plan      Co-evaluation              AM-PAC PT "6 Clicks" Mobility   Outcome Measure  Help needed turning from your back to your side while in a flat bed without using bedrails?: A Lot Help needed moving from lying on your back to sitting on the side of a flat bed without using bedrails?: A Lot Help needed moving to and from a bed to a chair (including a wheelchair)?: A Little Help needed standing up from a chair using your arms (e.g., wheelchair or bedside chair)?: A Lot Help needed to walk in hospital room?: A Little Help needed climbing 3-5 steps with a railing? : A Lot 6 Click Score: 14    End of Session Equipment Utilized During Treatment: Gait belt Activity Tolerance: Patient limited by fatigue;Patient limited by lethargy Patient left: in chair;with call bell/phone within reach;with chair alarm set Nurse Communication: Mobility status PT Visit Diagnosis: Unsteadiness on feet (R26.81);History of falling (Z91.81);Muscle weakness (generalized) (M62.81);Repeated falls (R29.6);Pain Pain - Right/Left: Right Pain - part of body: Leg     Time: 3086-5784 PT Time Calculation (min) (ACUTE  ONLY): 33 min  Charges:    $Gait Training: 8-22 mins $Therapeutic Exercise: 8-22 mins PT General Charges $$ ACUTE PT VISIT: 1 Visit                     Malachi Pro, DPT 02/23/2023, 6:12 PM

## 2023-02-24 LAB — CBC
HCT: 29.1 % — ABNORMAL LOW (ref 39.0–52.0)
Hemoglobin: 9.6 g/dL — ABNORMAL LOW (ref 13.0–17.0)
MCH: 28.7 pg (ref 26.0–34.0)
MCHC: 33 g/dL (ref 30.0–36.0)
MCV: 87.1 fL (ref 80.0–100.0)
Platelets: 72 10*3/uL — ABNORMAL LOW (ref 150–400)
RBC: 3.34 MIL/uL — ABNORMAL LOW (ref 4.22–5.81)
RDW: 13.1 % (ref 11.5–15.5)
WBC: 3.7 10*3/uL — ABNORMAL LOW (ref 4.0–10.5)
nRBC: 0 % (ref 0.0–0.2)

## 2023-02-24 LAB — GLUCOSE, CAPILLARY
Glucose-Capillary: 108 mg/dL — ABNORMAL HIGH (ref 70–99)
Glucose-Capillary: 118 mg/dL — ABNORMAL HIGH (ref 70–99)
Glucose-Capillary: 145 mg/dL — ABNORMAL HIGH (ref 70–99)
Glucose-Capillary: 74 mg/dL (ref 70–99)
Glucose-Capillary: 94 mg/dL (ref 70–99)
Glucose-Capillary: 96 mg/dL (ref 70–99)

## 2023-02-24 LAB — COMPREHENSIVE METABOLIC PANEL
ALT: 31 U/L (ref 0–44)
AST: 70 U/L — ABNORMAL HIGH (ref 15–41)
Albumin: 2.6 g/dL — ABNORMAL LOW (ref 3.5–5.0)
Alkaline Phosphatase: 47 U/L (ref 38–126)
Anion gap: 5 (ref 5–15)
BUN: 17 mg/dL (ref 6–20)
CO2: 28 mmol/L (ref 22–32)
Calcium: 8.3 mg/dL — ABNORMAL LOW (ref 8.9–10.3)
Chloride: 106 mmol/L (ref 98–111)
Creatinine, Ser: 0.9 mg/dL (ref 0.61–1.24)
GFR, Estimated: >60 mL/min (ref >60)
Glucose, Bld: 104 mg/dL — ABNORMAL HIGH (ref 70–99)
Potassium: 4.1 mmol/L (ref 3.5–5.1)
Sodium: 139 mmol/L (ref 135–145)
Total Bilirubin: 0.7 mg/dL (ref 0.3–1.2)
Total Protein: 5 g/dL — ABNORMAL LOW (ref 6.5–8.1)

## 2023-02-24 LAB — CK: Total CK: 791 U/L — ABNORMAL HIGH (ref 49–397)

## 2023-02-24 NOTE — TOC Progression Note (Signed)
Transition of Care [facility]) - Progression Note    Patient Details  Name: Derrick Atkins MRN: [id number] Date of Birth: Oct 05, 1970  [facility]) CM/SW Contact  Colette Ribas, [place] Phone Number: 02/24/2023, 9:15 AM  Clinical Narrative:    CSW spoke with patient's guardian, She accepted the bed offer. Provided her the name of the facility and the reason behind PT recommendations for SNF. Will submit Autho for patient, system access disabled currently.        Expected Discharge Plan and Services                                               Social Determinants of Health (SDOH) Interventions SDOH Screenings   Food Insecurity: No Food Insecurity (02/21/2023)  Recent Concern: Food Insecurity - Food Insecurity Present (01/06/2023)  Housing: Low Risk  (02/21/2023)  Transportation Needs: Unmet Transportation Needs (02/21/2023)  Utilities: Not At Risk (02/21/2023)  Alcohol Screen: Low Risk  (03/23/2022)  Tobacco Use: Low Risk  (02/21/2023)    Readmission Risk Interventions     No data to display

## 2023-02-24 NOTE — Care Management Important Message (Signed)
Important Message  Patient Details  Name: Derrick Atkins MRN: [id number] Date of Birth: [birth year]   Medicare Important Message Given:  N/A - LOS <3 / Initial given by admissions     Olegario Messier A Yuri Flener 02/24/2023, 7:53 AM

## 2023-02-24 NOTE — Progress Notes (Signed)
Physical Therapy Treatment Patient Details Name: Derrick Atkins MRN: [id number] DOB: Feb 09, 1971 Today's Date: 02/24/2023   History of Present Illness Pt is a 52 y.o. male with medical history significant of schizophrenia, hypertension, hyperlipidemia, diabetes mellitus, hypothyroidism, anemia, cerebral palsy, aggressive behavior, thrombocytopenia, left lower leg cellulitis with abscess, who presents with pain in left lower leg. Multiple recent falls.    PT Comments  Pt progressing steadily toward PT goals. Pt received in bed, he is agreeable to PT session. Pt performs bed mobility with use of bed railing and min A and transfers min-mod A from elevated surface for safety. Pt able to stand pivot transfer into recliner using RW min A with cuing for sequencing, but overall improving. Pt performs 1x STS from recliner mod A and able to perform standing exercises. Pt cont to demonstrate BLE generalized weakness and decreased TKE. Pt would benefit from cont skilled PT to address above deficits and promote optimal return to PLOF.    If plan is discharge home, recommend the following: A lot of help with walking and/or transfers;A lot of help with bathing/dressing/bathroom;Assistance with cooking/housework;Direct supervision/assist for medications management;Assist for transportation;Help with stairs or ramp for entrance   Can travel by private vehicle     No  Equipment Recommendations  None recommended by PT    Recommendations for Other Services       Precautions / Restrictions Precautions Precautions: Fall Restrictions Weight Bearing Restrictions: No     Mobility  Bed Mobility Overal bed mobility: Needs Assistance Bed Mobility: Supine to Sit     Supine to sit: Min assist     General bed mobility comments: VC for hand placement, use of bed rails    Transfers Overall transfer level: Needs assistance Equipment used: Rolling walker (2 wheels) Transfers: Bed to chair/wheelchair/BSC,  Sit to/from Stand Sit to Stand: Mod assist Stand pivot transfers: Min assist         General transfer comment: Frequent cuing required throughout transfers for initiation, sequencing, and safety. Pt cont to demonstrate good effort    Ambulation/Gait               General Gait Details: NT due to fatigue   Stairs             Wheelchair Mobility     Tilt Bed    Modified Rankin (Stroke Patients Only)       Balance Overall balance assessment: Needs assistance Sitting-balance support: Feet supported, Single extremity supported, No upper extremity supported Sitting balance-Leahy Scale: Good Sitting balance - Comments: Pt able to maintain seated balance at EOB with intermittent 1 UE support on bed rail CGA Postural control: Posterior lean Standing balance support: Bilateral upper extremity supported, During functional activity, Reliant on assistive device for balance Standing balance-Leahy Scale: Fair Standing balance comment: Able to stand with BUE support on RW mod A but able to progress to min A for safety and generalized BLE weakness                            Cognition Arousal: Alert Behavior During Therapy: WFL for tasks assessed/performed Overall Cognitive Status: Within Functional Limits for tasks assessed                                 General Comments: Pt alert and oriented x4, slow processing and frequent VC for initiation of tasks and throughout  session for hand placement and performing tasks        Exercises Total Joint Exercises Quad Sets: AROM, Both, 10 reps, Seated Marching in Standing: AROM, Both, 10 reps    General Comments        Pertinent Vitals/Pain Pain Assessment Pain Assessment: No/denies pain    Home Living                          Prior Function            PT Goals (current goals can now be found in the care plan section) Acute Rehab PT Goals PT Goal Formulation: With patient Time  For Goal Achievement: 03/08/23 Potential to Achieve Goals: Good Progress towards PT goals: Progressing toward goals    Frequency    Min 1X/week      PT Plan      Co-evaluation              AM-PAC PT "6 Clicks" Mobility   Outcome Measure  Help needed turning from your back to your side while in a flat bed without using bedrails?: A Little Help needed moving from lying on your back to sitting on the side of a flat bed without using bedrails?: A Little   Help needed standing up from a chair using your arms (e.g., wheelchair or bedside chair)?: A Lot Help needed to walk in hospital room?: A Lot Help needed climbing 3-5 steps with a railing? : A Lot 6 Click Score: 12    End of Session Equipment Utilized During Treatment: Gait belt Activity Tolerance: Patient limited by fatigue;Patient limited by lethargy Patient left: in chair;with call bell/phone within reach;with chair alarm set Nurse Communication: Mobility status PT Visit Diagnosis: Unsteadiness on feet (R26.81);History of falling (Z91.81);Muscle weakness (generalized) (M62.81);Repeated falls (R29.6);Pain     Time: 9147-8295 PT Time Calculation (min) (ACUTE ONLY): 16 min  Charges:    $Therapeutic Activity: 8-22 mins PT General Charges $$ ACUTE PT VISIT: 1 Visit                     Elmon Else, SPT    Judea Fennimore 02/24/2023, 3:43 PM

## 2023-02-24 NOTE — Plan of Care (Signed)

## 2023-02-24 NOTE — Progress Notes (Signed)
Occupational Therapy Treatment Patient Details Name: MCCORMICK RACHLIN MRN: [id number] DOB: 01/15/1971 Today's Date: 02/24/2023   History of present illness Pt is a 52 y.o. male with medical history significant of schizophrenia, hypertension, hyperlipidemia, diabetes mellitus, hypothyroidism, anemia, cerebral palsy, aggressive behavior, thrombocytopenia, left lower leg cellulitis with abscess, who presents with pain in left lower leg. Multiple recent falls.   OT comments  Pt seen for OT tx. Pt agreeable to participate despite feeling a bit sleepy, per pt report. Pt required MIN-MOD A for bed mobility with VC for hand placement and sequencing. Once EOB pt completed several grooming tasks with set up, supv for safety, and intermittent VC to support initiation of tasks. Slow processing noted. Pt demonstrating progress towards goals and improved motivation this date.       If plan is discharge home, recommend the following:  A lot of help with bathing/dressing/bathroom;Assistance with feeding;Help with stairs or ramp for entrance;Assist for transportation;Direct supervision/assist for medications management;Assistance with cooking/housework;A lot of help with walking and/or transfers   Equipment Recommendations  Other (comment);BSC/3in1 (defer to next venue, anticipate need for [facility] and [facility])    Recommendations for Other Services      Precautions / Restrictions Precautions Precautions: Fall Restrictions Weight Bearing Restrictions: No       Mobility Bed Mobility Overal bed mobility: Needs Assistance Bed Mobility: Supine to Sit, Sit to Supine     Supine to sit: Mod assist, Min assist Sit to supine: Mod assist   General bed mobility comments: VC for hand placement, use of bed rails    Transfers Overall transfer level: Needs assistance Equipment used: None Transfers: Bed to chair/wheelchair/BSC            Lateral/Scoot Transfers: Mod assist General transfer comment: MAX VC for  initiation and sequencing with good effort     Balance Overall balance assessment: Needs assistance Sitting-balance support: Feet supported, Single extremity supported, No upper extremity supported Sitting balance-Leahy Scale: Good                                     ADL either performed or assessed with clinical judgement   ADL Overall ADL's : Needs assistance/impaired     Grooming: Sitting;Supervision/safety;Set up;Wash/dry hands;Wash/dry face;Oral care;Applying deodorant;Cueing for sequencing Grooming Details (indicate cue type and reason): intermittent VC to initiate ("what's next?)                                    Extremity/Trunk Assessment              Vision       Perception     Praxis      Cognition Arousal: Alert Behavior During Therapy: WFL for tasks assessed/performed Overall Cognitive Status: Within Functional Limits for tasks assessed                                 General Comments: Pt alert and oriented x4, slow processing and VC PRN for initiation of tasks, some difficulty with recall and scattered thoughts requiring additional VC during session        Exercises      Shoulder Instructions       General Comments      Pertinent Vitals/ Pain       Pain  Assessment Pain Assessment: Faces Faces Pain Scale: Hurts a little bit Pain Location: R great toe Pain Descriptors / Indicators: Aching Pain Intervention(s): Limited activity within patient's tolerance, Monitored during session, Repositioned  Home Living                                          Prior Functioning/Environment              Frequency  Min 1X/week        Progress Toward Goals  OT Goals(current goals can now be found in the care plan section)  Progress towards OT goals: Progressing toward goals  Acute Rehab OT Goals Patient Stated Goal: be in a good mood again OT Goal Formulation: With patient Time  For Goal Achievement: 03/08/23 Potential to Achieve Goals: Good  Plan      Co-evaluation                 AM-PAC OT "6 Clicks" Daily Activity     Outcome Measure   Help from another person eating meals?: None Help from another person taking care of personal grooming?: A Little Help from another person toileting, which includes using toliet, bedpan, or urinal?: A Lot Help from another person bathing (including washing, rinsing, drying)?: A Lot Help from another person to put on and taking off regular upper body clothing?: A Lot Help from another person to put on and taking off regular lower body clothing?: A Lot 6 Click Score: 15    End of Session    OT Visit Diagnosis: Other abnormalities of gait and mobility (R26.89);Repeated falls (R29.6);Muscle weakness (generalized) (M62.81);Pain Pain - Right/Left: Right Pain - part of body: Ankle and joints of foot   Activity Tolerance Patient tolerated treatment well   Patient Left in bed;with call bell/phone within reach;with bed alarm set   Nurse Communication          Time: 4098-1191 OT Time Calculation (min): 18 min  Charges: OT General Charges $OT Visit: 1 Visit OT Treatments $Self Care/Home Management : 8-22 mins  Arman Filter., MPH, MS, OTR/L ascom [phone] 02/24/23, 1:34 PM

## 2023-02-24 NOTE — Progress Notes (Signed)
Progress Note   Patient: Derrick Atkins UEA:[id number] DOB: [birth year] DOA: 02/21/2023     3 DOS: the patient was seen and examined on 02/24/2023   Brief hospital course: 89M, hx of schizophrenia, HTN, HLD, diet-controled DM, hypothyroidism, anemia, cerebral palsy, aggressive behavior, thrombocytopenia, With history of left lower leg cellulitis with abscess, and admitted twice in June. Patient was s/p of I&D, presents with recurrent left lower extremity cellulitis.  No fever or leukocytosis.  Clinically not septic.  Negative lower extremity venous Doppler for DVT.  Continue on IV Rocephin.  Has rhabdomyolysis and AKI with CK 8800. On IVFs   Assessment and Plan:   Left lower extremity cellulitis:  Continue on IV rocephin.  Discontinued vancomycin Wound care. Pain control with oxycodone, morphine prn    Status post fall: CT head, CT of C-spine, x-ray of right foot unremarkable for any fractures/dislocation.  PT/OT recommends SNF    Type 2 diabetes: HbA1c 4.7, well controlled.    Hypoglycemia: Resolved, blood sugars on the lower side Reduce D5 NS IV fluid rate to 50 cc/h   Acute renal failure likely secondary to rhabdomyolysis, dehydration, bactrim & lasix use: Almost Resolved Reduce D5 NS IV fluid rate to 50 cc/h Holding lasix, bactrim  Rhabdomyolysis: traumatic & secondary to fall. CK is still elevated but trending down.  Reduce D5 NS IV fluid rate to 50 cc/h resolved   HTN: Soft.  Holding lasix.  IV hydralazine prn    Schizophrenia: paranoid & chronic. Stable Continue on home dose of depakote, cogentin, olanzapine, hydroxyzine. Abilify Q28 days   Hypothyroidism: continue on levothyroxine    HLD: Hold fenofibrate.  Hold statin  IDA: continue on iron supplement   Thrombocytopenia: chronic. Improving this AM 72K. Will continue to monitor    Transaminitis: etiology unclear.  Hepatitis panel is neg.  Continue to hold statin    Long QT interval: Continue to buspar,  trazodone. No zofran       Subjective: Reported pain and swelling in left lower leg  Physical Exam:  Physical Exam Constitutional:      General: He is not in acute distress.    Appearance: Normal appearance. He is ill-appearing and toxic-appearing.  HENT:     Head: Normocephalic and atraumatic.     Nose: Nose normal. No congestion.     Mouth/Throat:     Mouth: Mucous membranes are moist.     Pharynx: Oropharynx is clear. No oropharyngeal exudate.  Eyes:     Extraocular Movements: Extraocular movements intact.     Conjunctiva/sclera: Conjunctivae normal.     Pupils: Pupils are equal, round, and reactive to light.  Cardiovascular:     Rate and Rhythm: Normal rate and regular rhythm.     Pulses: Normal pulses.     Heart sounds: Normal heart sounds. No murmur heard. Pulmonary:     Effort: Pulmonary effort is normal.     Breath sounds: Normal breath sounds.  Abdominal:     General: Abdomen is flat. Bowel sounds are normal.     Palpations: Abdomen is soft.  Musculoskeletal:        General: Swelling and tenderness present.     Cervical back: Normal range of motion and neck supple.     Left lower leg: Edema present.     Comments: LLE findings  Skin:    General: Skin is warm.     Capillary Refill: Capillary refill takes 2 to 3 seconds.     Findings: Erythema and lesion present.  Neurological:     Mental Status: Mental status is at baseline.     Cranial Nerves: No cranial nerve deficit.     Sensory: No sensory deficit.  Psychiatric:     Comments: Low mood     Vitals:   02/23/23 0807 02/23/23 1546 02/23/23 2037 02/24/23 0405  BP: (!) 108/54 (!) 109/54 131/78 (!) 110/54  Pulse: 75  85 87  Resp: 16 16 19    Temp: 97.7 F (36.5 C) 97.7 F (36.5 C) 97.9 F (36.6 C) (!) 97.5 F (36.4 C)  TempSrc: Oral Oral  Oral  SpO2: 98% 100% 100% 100%  Weight:      Height:        Data Reviewed:  Latest Reference Range & Units 02/24/23 04:24  Sodium 135 - 145 mmol/L 139   Potassium 3.5 - 5.1 mmol/L 4.1  Chloride 98 - 111 mmol/L 106  CO2 22 - 32 mmol/L 28  Glucose 70 - 99 mg/dL 884 (H)  BUN 6 - 20 mg/dL 17  Creatinine 1.66 - 0.63 mg/dL 0.16  Calcium 8.9 - 01.0 mg/dL 8.3 (L)  Anion gap 5 - 15  5  Alkaline Phosphatase 38 - 126 U/L 47  Albumin 3.5 - 5.0 g/dL 2.6 (L)  AST 15 - 41 U/L 70 (H)  ALT 0 - 44 U/L 31  Total Protein 6.5 - 8.1 g/dL 5.0 (L)  Total Bilirubin 0.3 - 1.2 mg/dL 0.7  GFR, Estimated >93 mL/min >60  CK Total 49 - 397 U/L 791 (H)  WBC 4.0 - 10.5 K/uL 3.7 (L)  RBC 4.22 - 5.81 MIL/uL 3.34 (L)  Hemoglobin 13.0 - 17.0 g/dL 9.6 (L)  HCT 23.5 - 57.3 % 29.1 (L)  MCV 80.0 - 100.0 fL 87.1  MCH 26.0 - 34.0 pg 28.7  MCHC 30.0 - 36.0 g/dL 22.0  RDW 25.4 - 27.0 % 13.1  Platelets 150 - 400 K/uL 72 (L)  nRBC 0.0 - 0.2 % 0.0  (H): Data is abnormally high (L): Data is abnormally low  Family Communication: none  Disposition: Status is: Inpatient   Planned Discharge Destination: Skilled nursing facility    Time spent: 35 minutes  Author: Ernestene Mention, MD 02/24/2023 7:30 AM  For on call review [email or web address].

## 2023-02-24 NOTE — Plan of Care (Signed)

## 2023-02-25 LAB — GLUCOSE, CAPILLARY
Glucose-Capillary: 100 mg/dL — ABNORMAL HIGH (ref 70–99)
Glucose-Capillary: 145 mg/dL — ABNORMAL HIGH (ref 70–99)
Glucose-Capillary: 156 mg/dL — ABNORMAL HIGH (ref 70–99)
Glucose-Capillary: 160 mg/dL — ABNORMAL HIGH (ref 70–99)
Glucose-Capillary: 172 mg/dL — ABNORMAL HIGH (ref 70–99)
Glucose-Capillary: 77 mg/dL (ref 70–99)
Glucose-Capillary: 89 mg/dL (ref 70–99)

## 2023-02-25 MED ORDER — CLINDAMYCIN HCL 150 MG PO CAPS
300.0000 mg | ORAL_CAPSULE | Freq: Three times a day (TID) | ORAL | Status: DC
  Administered 2023-02-25 – 2023-02-28 (×9): 300 mg via ORAL
  Filled 2023-02-25 (×10): qty 2

## 2023-02-25 MED ORDER — FUROSEMIDE 20 MG PO TABS
20.0000 mg | ORAL_TABLET | Freq: Every day | ORAL | Status: DC
  Administered 2023-02-25 – 2023-02-28 (×4): 20 mg via ORAL
  Filled 2023-02-25 (×4): qty 1

## 2023-02-25 NOTE — Plan of Care (Signed)
  Problem: Education: Goal: Knowledge of General Education information will improve Description: Including pain rating scale, medication(s)/side effects and non-pharmacologic comfort measures Outcome: Progressing   Problem: Clinical Measurements: Goal: Ability to maintain clinical measurements within normal limits will improve Outcome: Progressing   Problem: Activity: Goal: Risk for activity intolerance will decrease Outcome: Progressing   Problem: Nutrition: Goal: Adequate nutrition will be maintained Outcome: Progressing   Problem: Coping: Goal: Level of anxiety will decrease Outcome: Progressing   Problem: Elimination: Goal: Will not experience complications related to bowel motility Outcome: Progressing   Problem: Safety: Goal: Ability to remain free from injury will improve Outcome: Progressing   Problem: Skin Integrity: Goal: Risk for impaired skin integrity will decrease Outcome: Progressing   

## 2023-02-25 NOTE — Progress Notes (Signed)
Patient is alert and oriented X 2. Patient removed his I/v by himself.MD made aware and ok to not get new I/v on him.

## 2023-02-25 NOTE — Plan of Care (Signed)

## 2023-02-25 NOTE — Progress Notes (Signed)
Progress Note   Patient: Derrick Atkins:[id number] DOB: 1971-05-21 DOA: 02/21/2023     4 DOS: the patient was seen and examined on 02/25/2023   Brief hospital course: 77M, hx of schizophrenia, HTN, HLD, diet-controled DM, hypothyroidism, anemia, cerebral palsy, aggressive behavior, thrombocytopenia, With history of left lower leg cellulitis with abscess, and admitted twice in June. Patient was s/p of I&D, presents with recurrent left lower extremity cellulitis.  No fever or leukocytosis.  Clinically not septic.  Negative lower extremity venous Doppler for DVT.  Continue on IV Rocephin.  Has rhabdomyolysis and AKI with CK 8800. On IVFs   Assessment and Plan:   Left lower extremity cellulitis:  Change IV rocephin to Oral clindamycin Q8hrs.   Discontinued vancomycin Wound care. Pain control with oxycodone prn.  Discontinue morphine IV prn   Status post fall: CT head, CT of C-spine, x-ray of right foot unremarkable for any fractures/dislocation.  PT/OT recommends SNF    Type 2 diabetes: HbA1c 4.7, well controlled.    Hypoglycemia: Resolved, blood sugars on the lower side Stop D5 NS IV fluids Encourage PO intake   Acute renal failure likely secondary to rhabdomyolysis, dehydration, bactrim & lasix use: Almost Resolved Stop IV fluids Encourage PO fluid intake Holding lasix, bactrim  Rhabdomyolysis: traumatic & secondary to fall. CK is still elevated but trending down.  Stop IV fluids Encourage PO fluid intake Repeat CPK in AM   HTN: Soft.  Resume lasix.  IV hydralazine prn    Schizophrenia: paranoid & chronic. Stable Continue on home dose of depakote, cogentin, olanzapine, hydroxyzine. Abilify Q28 days   Hypothyroidism: continue on levothyroxine    HLD: Hold fenofibrate.  Hold statin  IDA: continue on iron supplement   Thrombocytopenia: chronic. Improving, Repeat CBC in am   Transaminitis: etiology unclear.  Hepatitis panel is neg.  Continue to hold statin     Long QT interval: Continue to buspar, trazodone. No zofran       Subjective: Reported pain and swelling in left lower leg  Physical Exam:  Physical Exam Constitutional:      General: He is not in acute distress.    Appearance: Normal appearance. He is not ill-appearing.  HENT:     Head: Normocephalic and atraumatic.     Nose: Nose normal. No congestion.     Mouth/Throat:     Mouth: Mucous membranes are moist.     Pharynx: Oropharynx is clear. No oropharyngeal exudate.  Eyes:     Extraocular Movements: Extraocular movements intact.     Conjunctiva/sclera: Conjunctivae normal.     Pupils: Pupils are equal, round, and reactive to light.  Cardiovascular:     Rate and Rhythm: Normal rate and regular rhythm.     Pulses: Normal pulses.     Heart sounds: Normal heart sounds. No murmur heard. Pulmonary:     Effort: Pulmonary effort is normal.     Breath sounds: Normal breath sounds.  Abdominal:     General: Abdomen is flat. Bowel sounds are normal.     Palpations: Abdomen is soft.  Musculoskeletal:        General: Swelling and tenderness present.     Cervical back: Normal range of motion and neck supple.     Left lower leg: Edema present.     Comments: LLE findings  Skin:    General: Skin is warm.     Capillary Refill: Capillary refill takes 2 to 3 seconds.     Findings: Erythema and lesion present.  Neurological:     Mental Status: He is alert. Mental status is at baseline.     Cranial Nerves: No cranial nerve deficit.     Sensory: No sensory deficit.  Psychiatric:     Comments: Low mood     Vitals:   02/24/23 1533 02/24/23 2016 02/25/23 0435 02/25/23 0745  BP: 118/68 133/70 127/67 126/77  Pulse: 80 75 91 88  Resp:  20 20 18   Temp:  98.4 F (36.9 C) 97.7 F (36.5 C) 98 F (36.7 C)  TempSrc:  Oral Oral Axillary  SpO2: 100% 100% 97% 100%  Weight:      Height:        Data Reviewed:  Latest Reference Range & Units 02/24/23 04:24  Sodium 135 - 145 mmol/L 139   Potassium 3.5 - 5.1 mmol/L 4.1  Chloride 98 - 111 mmol/L 106  CO2 22 - 32 mmol/L 28  Glucose 70 - 99 mg/dL 161 (H)  BUN 6 - 20 mg/dL 17  Creatinine 0.96 - 0.45 mg/dL 4.09  Calcium 8.9 - 81.1 mg/dL 8.3 (L)  Anion gap 5 - 15  5  Alkaline Phosphatase 38 - 126 U/L 47  Albumin 3.5 - 5.0 g/dL 2.6 (L)  AST 15 - 41 U/L 70 (H)  ALT 0 - 44 U/L 31  Total Protein 6.5 - 8.1 g/dL 5.0 (L)  Total Bilirubin 0.3 - 1.2 mg/dL 0.7  GFR, Estimated >91 mL/min >60  CK Total 49 - 397 U/L 791 (H)  WBC 4.0 - 10.5 K/uL 3.7 (L)  RBC 4.22 - 5.81 MIL/uL 3.34 (L)  Hemoglobin 13.0 - 17.0 g/dL 9.6 (L)  HCT 47.8 - 29.5 % 29.1 (L)  MCV 80.0 - 100.0 fL 87.1  MCH 26.0 - 34.0 pg 28.7  MCHC 30.0 - 36.0 g/dL 62.1  RDW 30.8 - 65.7 % 13.1  Platelets 150 - 400 K/uL 72 (L)  nRBC 0.0 - 0.2 % 0.0  (H): Data is abnormally high (L): Data is abnormally low  Family Communication: none  Disposition: Status is: Inpatient   Planned Discharge Destination: Skilled nursing facility, CSW has reached out to [employer] to see about this. Once this is determined we can start insurance authorization for patient to go to [place].     Time spent: 35 minutes  Author: Ernestene Mention, MD 02/25/2023 11:36 AM  For on call review [email or web address].

## 2023-02-25 NOTE — TOC Progression Note (Addendum)
Transition of Care [facility]) - Progression Note    Patient Details  Name: Derrick Atkins MRN: [id number] Date of Birth: Apr 24, 1971  [facility] of [facility]) CM/SW Contact  Allena Katz, LCSW Phone Number: 02/25/2023, 10:20 AM  Clinical Narrative:   Per Madilyn Hook with DSS pt's abilify shot is due on August 21. Facility unable to pay for this and is asking if DSS can provide. CSW has reached out to [employer] to see about this. Once this is determined we can start insurance authorization for patient to go to [place].          Expected Discharge Plan and Services                                               Social Determinants of Health (SDOH) Interventions SDOH Screenings   Food Insecurity: No Food Insecurity (02/21/2023)  Recent Concern: Food Insecurity - Food Insecurity Present (01/06/2023)  Housing: Low Risk  (02/21/2023)  Transportation Needs: Unmet Transportation Needs (02/21/2023)  Utilities: Not At Risk (02/21/2023)  Alcohol Screen: Low Risk  (03/23/2022)  Tobacco Use: Low Risk  (02/21/2023)    Readmission Risk Interventions     No data to display

## 2023-02-25 NOTE — Progress Notes (Addendum)
Physical Therapy Treatment Patient Details Name: Derrick Atkins MRN: [id number] DOB: 07/20/1970 Today's Date: 02/25/2023   History of Present Illness Pt is a 52 y.o. male with medical history significant of schizophrenia, hypertension, hyperlipidemia, diabetes mellitus, hypothyroidism, anemia, cerebral palsy, aggressive behavior, thrombocytopenia, left lower leg cellulitis with abscess, who presents with pain in left lower leg. Multiple recent falls.    PT Comments  Pt is progressing towards PT goals. Pt is received in bed, he is agreeable to PT session. Pt performs bed mobility min A, transfers and amb CGA for safety. Pt able to perform series of standing exercises focusing on strength and balance with use of RW and ADLs min occasional min A for maintaining balance and safety. No reports of increased pain throughout session. Pt cont to demonstrate increase motivation and improvements with overall strength. Pt would benefit from cont skilled PT to address above deficits and promote optimal return to PLOF.    If plan is discharge home, recommend the following: Assistance with cooking/housework;Direct supervision/assist for medications management;Assist for transportation;Help with stairs or ramp for entrance;A little help with walking and/or transfers;A little help with bathing/dressing/bathroom   Can travel by private vehicle     No  Equipment Recommendations  None recommended by PT    Recommendations for Other Services       Precautions / Restrictions Precautions Precautions: Fall Restrictions Weight Bearing Restrictions: No     Mobility  Bed Mobility Overal bed mobility: Needs Assistance Bed Mobility: Supine to Sit     Supine to sit: Min assist Sit to supine: Min assist   General bed mobility comments: Able to perform bed mobility with min cuing and assistance    Transfers Overall transfer level: Needs assistance Equipment used: Rolling walker (2 wheels) Transfers: Sit  to/from Stand Sit to Stand: Contact guard assist           General transfer comment: cuing for hand placement for repeated STS from EOB x5    Ambulation/Gait Ambulation/Gait assistance: Contact guard assist Gait Distance (Feet): 25 Feet Assistive device: Rolling walker (2 wheels) Gait Pattern/deviations: Step-through pattern, Decreased step length - right, Decreased step length - left, Decreased stride length Gait velocity: decreased     General Gait Details: Able to amb with RW with cuing for safety   Stairs             Wheelchair Mobility     Tilt Bed    Modified Rankin (Stroke Patients Only)       Balance Overall balance assessment: Needs assistance Sitting-balance support: Feet supported, No upper extremity supported, Single extremity supported Sitting balance-Leahy Scale: Good Sitting balance - Comments: Pt able to maintain seated balance at EOB with intermittent 1 UE support on bed rail CGA   Standing balance support: Bilateral upper extremity supported, During functional activity, Reliant on assistive device for balance, No upper extremity supported Standing balance-Leahy Scale: Good Standing balance comment: Able to maintain static standing balance with BUE support on RW min A for safety, but overall improving                            Cognition Arousal: Alert Behavior During Therapy: WFL for tasks assessed/performed Overall Cognitive Status: Within Functional Limits for tasks assessed                                 General Comments: slow  processing although improving        Exercises Other Exercises Other Exercises: Standing marches with RW 2x10, static standing balance 3x30 sec, standing alt 1UE support 2x10, 1x15 ft side stepping around bed frame with RW Other Exercises: Toileting, min A with hygiene care for balance. Had incontinenece episode and cuing required due to decreased awareness. Cuing and facilitation  required with proper hygiene including wiping and hand washing. Linens were replaced due to them being soiled.    General Comments        Pertinent Vitals/Pain Pain Assessment Pain Assessment: No/denies pain    Home Living                          Prior Function            PT Goals (current goals can now be found in the care plan section) Acute Rehab PT Goals Patient Stated Goal: to get better PT Goal Formulation: With patient Time For Goal Achievement: 03/08/23 Potential to Achieve Goals: Good Progress towards PT goals: Progressing toward goals    Frequency    Min 1X/week      PT Plan      Co-evaluation              AM-PAC PT "6 Clicks" Mobility   Outcome Measure  Help needed turning from your back to your side while in a flat bed without using bedrails?: A Little Help needed moving from lying on your back to sitting on the side of a flat bed without using bedrails?: A Little Help needed moving to and from a bed to a chair (including a wheelchair)?: A Little Help needed standing up from a chair using your arms (e.g., wheelchair or bedside chair)?: A Little Help needed to walk in hospital room?: A Little Help needed climbing 3-5 steps with a railing? : A Lot 6 Click Score: 17    End of Session Equipment Utilized During Treatment: Gait belt Activity Tolerance: Patient tolerated treatment well Patient left: in bed;with call bell/phone within reach;with bed alarm set Nurse Communication: Mobility status PT Visit Diagnosis: Unsteadiness on feet (R26.81);History of falling (Z91.81);Muscle weakness (generalized) (M62.81);Repeated falls (R29.6);Pain     Time: 1453-1520 PT Time Calculation (min) (ACUTE ONLY): 27 min  Charges:    $Gait Training: 8-22 mins $Therapeutic Activity: 8-22 mins PT General Charges $$ ACUTE PT VISIT: 1 Visit                     Elmon Else, SPT   Vito Beg 02/25/2023, 4:19 PM

## 2023-02-25 NOTE — Care Management Important Message (Signed)
Important Message  Patient Details  Name: Derrick Atkins MRN: [id number] Date of Birth: 02-Feb-1971   Medicare Important Message Given:  Yes  I called [phone]) the patient's Legal Guardian, Lenn Sink with DSS and reviewed the Important Message from Medicare with her and she stated she understood these rights. I wished her a good day and thanked her for her time.   Olegario Messier A Kaely Hollan 02/25/2023, 10:18 AM

## 2023-02-25 NOTE — Progress Notes (Signed)
Occupational Therapy Treatment Patient Details Name: Derrick Atkins MRN: [id number] DOB: January 22, 1971 Today's Date: 02/25/2023   History of present illness Pt is a 52 y.o. male with medical history significant of schizophrenia, hypertension, hyperlipidemia, diabetes mellitus, hypothyroidism, anemia, cerebral palsy, aggressive behavior, thrombocytopenia, left lower leg cellulitis with abscess, who presents with pain in left lower leg. Multiple recent falls.   OT comments  Pt seen for OT tx. Pt received in recliner, agreeable to session. Pt setup for seated grooming tasks with PRN VC for sequencing/initiation despite visual cues. Pt completed 5 STS transfers from recliner with RW and decreasing VC for hand placement with repetition. Pt required MIN A for initial STS, improving to CGA for the 4 subsequent transfers with improved hand placement with each attempt. Noted mild improvement in pt's ability to visually scan and select appropriate items from paper menu for lunch. OT required to dial phone number and with visual cue of items written down on paper, pt able to utilize visual aide to order his meal. Pt progressing well towards goals.       If plan is discharge home, recommend the following:  A lot of help with bathing/dressing/bathroom;Assistance with feeding;Help with stairs or ramp for entrance;Assist for transportation;Direct supervision/assist for medications management;Assistance with cooking/housework;A little help with walking and/or transfers   Equipment Recommendations  BSC/3in1;Other (comment) (defer to next venue, anticipate need for [facility] and [facility])    Recommendations for Other Services      Precautions / Restrictions Precautions Precautions: Fall Restrictions Weight Bearing Restrictions: No       Mobility Bed Mobility               General bed mobility comments: NT, in recliner    Transfers Overall transfer level: Needs assistance Equipment used: Rolling walker (2  wheels) Transfers: Sit to/from Stand Sit to Stand: Contact guard assist           General transfer comment: frequent VC for hand placement for repeated STS from recliner x5     Balance Overall balance assessment: Needs assistance Sitting-balance support: Feet supported, Single extremity supported, No upper extremity supported Sitting balance-Leahy Scale: Good     Standing balance support: Bilateral upper extremity supported, During functional activity, Reliant on assistive device for balance Standing balance-Leahy Scale: Fair                             ADL either performed or assessed with clinical judgement   ADL Overall ADL's : Needs assistance/impaired     Grooming: Sitting;Set up;Oral care;Supervision/safety;Cueing for sequencing Grooming Details (indicate cue type and reason): intermittent VC to initiate/sequence                                    Extremity/Trunk Assessment              Vision       Perception     Praxis      Cognition Arousal: Alert Behavior During Therapy: WFL for tasks assessed/performed Overall Cognitive Status: Within Functional Limits for tasks assessed                                 General Comments: slow processing and PRN VC for initiation of tasks        Exercises Other Exercises Other Exercises:  Pt required some VC to support meal selection for lunch. Improved ability to order after phone number dialed with visual cue to assist in recall of items he wanted to order.    Shoulder Instructions       General Comments      Pertinent Vitals/ Pain       Pain Assessment Pain Assessment: No/denies pain  Home Living                                          Prior Functioning/Environment              Frequency  Min 1X/week        Progress Toward Goals  OT Goals(current goals can now be found in the care plan section)  Progress towards OT goals:  Progressing toward goals  Acute Rehab OT Goals Patient Stated Goal: be in a good mood again OT Goal Formulation: With patient Time For Goal Achievement: 03/08/23 Potential to Achieve Goals: Good  Plan      Co-evaluation                 AM-PAC OT "6 Clicks" Daily Activity     Outcome Measure   Help from another person eating meals?: None Help from another person taking care of personal grooming?: A Little Help from another person toileting, which includes using toliet, bedpan, or urinal?: A Lot Help from another person bathing (including washing, rinsing, drying)?: A Lot Help from another person to put on and taking off regular upper body clothing?: A Little Help from another person to put on and taking off regular lower body clothing?: A Lot 6 Click Score: 16    End of Session Equipment Utilized During Treatment: Rolling walker (2 wheels)  OT Visit Diagnosis: Other abnormalities of gait and mobility (R26.89);Repeated falls (R29.6);Muscle weakness (generalized) (M62.81);Pain Pain - Right/Left: Right Pain - part of body: Ankle and joints of foot   Activity Tolerance Patient tolerated treatment well   Patient Left in chair;with call bell/phone within reach;with chair alarm set   Nurse Communication          Time: 2952-8413 OT Time Calculation (min): 14 min  Charges: OT General Charges $OT Visit: 1 Visit OT Treatments $Self Care/Home Management : 8-22 mins  Arman Filter., MPH, MS, OTR/L ascom [phone] 02/25/23, 1:07 PM

## 2023-02-25 NOTE — TOC Progression Note (Signed)
Transition of Care [facility]) - Progression Note    Patient Details  Name: Derrick Atkins MRN: [id number] Date of Birth: 03-31-1971  Transition of Care [facility]) CM/SW Contact  Allena Katz, LCSW Phone Number: 02/25/2023, 2:52 PM  Clinical Narrative:   [facility]erk[facility] Harvey started for [facility]. Revonda Standard can take Saturday or Sunday if we get auth.         Expected Discharge Plan and Services                                               Social Determinants of Health (SDOH) Interventions SDOH Screenings   Food Insecurity: No Food Insecurity (02/21/2023)  Recent Concern: Food Insecurity - Food Insecurity Present (01/06/2023)  Housing: Low Risk  (02/21/2023)  Transportation Needs: Unmet Transportation Needs (02/21/2023)  Utilities: Not At Risk (02/21/2023)  Alcohol Screen: Low Risk  (03/23/2022)  Tobacco Use: Low Risk  (02/21/2023)    Readmission Risk Interventions     No data to display

## 2023-02-26 DIAGNOSIS — R262 Difficulty in walking, not elsewhere classified: Secondary | ICD-10-CM | POA: Insufficient documentation

## 2023-02-26 DIAGNOSIS — R9431 Abnormal electrocardiogram [ECG] [EKG]: Secondary | ICD-10-CM | POA: Insufficient documentation

## 2023-02-26 DIAGNOSIS — E785 Hyperlipidemia, unspecified: Secondary | ICD-10-CM | POA: Insufficient documentation

## 2023-02-26 DIAGNOSIS — D649 Anemia, unspecified: Secondary | ICD-10-CM | POA: Insufficient documentation

## 2023-02-26 DIAGNOSIS — L03116 Cellulitis of left lower limb: Secondary | ICD-10-CM | POA: Diagnosis not present

## 2023-02-26 DIAGNOSIS — E119 Type 2 diabetes mellitus without complications: Secondary | ICD-10-CM

## 2023-02-26 LAB — BASIC METABOLIC PANEL
Anion gap: 5 (ref 5–15)
BUN: 17 mg/dL (ref 6–20)
CO2: 26 mmol/L (ref 22–32)
Calcium: 8.7 mg/dL — ABNORMAL LOW (ref 8.9–10.3)
Chloride: 109 mmol/L (ref 98–111)
Creatinine, Ser: 0.8 mg/dL (ref 0.61–1.24)
GFR, Estimated: >60 mL/min (ref >60)
Glucose, Bld: 103 mg/dL — ABNORMAL HIGH (ref 70–99)
Potassium: 3.8 mmol/L (ref 3.5–5.1)
Sodium: 140 mmol/L (ref 135–145)

## 2023-02-26 LAB — CBC
HCT: 29.4 % — ABNORMAL LOW (ref 39.0–52.0)
Hemoglobin: 9.9 g/dL — ABNORMAL LOW (ref 13.0–17.0)
MCH: 28.5 pg (ref 26.0–34.0)
MCHC: 33.7 g/dL (ref 30.0–36.0)
MCV: 84.7 fL (ref 80.0–100.0)
Platelets: 97 10*3/uL — ABNORMAL LOW (ref 150–400)
RBC: 3.47 MIL/uL — ABNORMAL LOW (ref 4.22–5.81)
RDW: 13.2 % (ref 11.5–15.5)
WBC: 5 10*3/uL (ref 4.0–10.5)
nRBC: 0 % (ref 0.0–0.2)

## 2023-02-26 LAB — GLUCOSE, CAPILLARY
Glucose-Capillary: 102 mg/dL — ABNORMAL HIGH (ref 70–99)
Glucose-Capillary: 90 mg/dL (ref 70–99)
Glucose-Capillary: 123 mg/dL — ABNORMAL HIGH (ref 70–99)
Glucose-Capillary: 152 mg/dL — ABNORMAL HIGH (ref 70–99)
Glucose-Capillary: 168 mg/dL — ABNORMAL HIGH (ref 70–99)

## 2023-02-26 LAB — CK: Total CK: 260 U/L (ref 49–397)

## 2023-02-26 MED ORDER — POTASSIUM CHLORIDE 20 MEQ PO PACK
20.0000 meq | PACK | Freq: Every day | ORAL | Status: DC
  Administered 2023-02-26 – 2023-02-28 (×3): 20 meq via ORAL
  Filled 2023-02-26 (×3): qty 1

## 2023-02-26 MED ORDER — CLINDAMYCIN HCL 300 MG PO CAPS: 300.0000 mg | ORAL_CAPSULE | Freq: Three times a day (TID) | ORAL | 0 refills | Status: AC

## 2023-02-26 MED ORDER — POTASSIUM CHLORIDE 20 MEQ PO PACK: 20.0000 meq | PACK | Freq: Every day | ORAL | 0 refills | Status: DC

## 2023-02-26 NOTE — Assessment & Plan Note (Signed)
DM2, as per report A1c 4.7 had hypoglycemia during stay , resolved Not D/C on medications for PCP follow-up

## 2023-02-26 NOTE — Progress Notes (Signed)
Progress Note   Patient: Derrick Atkins XBJ:[id number] DOB: 06-16-1971 DOA: 02/21/2023     5 DOS: the patient was seen and examined on 02/26/2023   Brief hospital course: 35M, hx of schizophrenia, HTN, HLD, diet-controled DM, hypothyroidism, anemia, cerebral palsy, aggressive behavior, thrombocytopenia, With history of left lower leg cellulitis with abscess, and admitted twice in June. Patient was s/p of I&D, presents with recurrent left lower extremity cellulitis.  Patient managed intially on IV Vancomycin and Ceftriaxone then Clindamycin, total 4 days IV Abx as inpatient so far  with marked clinical imporovement. Also noted to have rhabdomyolysis likely secondary to fall , possible  anti-lipid agent contition which resolved prior to discharge. Plan is to Discharge on PO Clindamycin to SNF.  Assessment and Plan: * Cellulitis of left lower extremity Left Lower Extremity Cellulitis  Afebrile and WBC 5 today Patient managed intially on IV Vancomycin and Ceftriaxone then Clindamycin, total 4 days IV Abx as inpatient with marked clinical imporovement. Of note the patient was last seen for wound check following I+D as outpatient on 01/11/2023 with note at that time noting delayed wound healing but no acute concerns. No signs of collection currently or indication for repeat advanced imaging, improving on PO Clindamycin, will continue despite C. Diff risk given he has had such a good clinical response and difficulty with bactrim in past and doxy resistance patterns. Completed 4 days between IV and PO so far inpatient and should complete additional 6 days PO Clindamycin for a total of 10 days therapy  Rhabdomyolysis Rhabdomyolysis, suspect primarily traumatic Stopped anti-lipid agents as well during acute event Follow-up with PCP for interval challenge Resolved, currently 260    AKI (acute kidney injury) (HCC)   Acute Kidney Injury Secondary to over-diuresis and Bactrim Cr 0.8  today  Thrombocytopenia (HCC) Anemia and Thrombocytopenia The patient is noted to have chronic thrombocytopenia Platelets noted 97 from 72 today Follow for interval addition of DVT Ppx given Plts seem to be stabilizing >50  Hypothyroidism  Hypothyroidism Continue Levothyroxine  Long QT interval Prolonged QT Follow Lytes, ordered a.m. magnesium Of note patient on buspar and trazadone  Normocytic anemia The patient is noted to have chronic thrombocytopenia as well as anemia Repeat CBC in AM and PCP Follow-up Hemoglobin noted 9.9 from 9.6 today  Prolonged QT interval Prolonged QT Follow Lytes, ordered a.m. magnesium Of note patient on buspar and trazadone  Hyperlipidemia Hyperlipidemia Meds on Hold as per above in setting of acute rhabdo Can challenge as outpatient   DM2 (diabetes mellitus, type 2) (HCC) DM2, as per report A1c 4.7 had hypoglycemia during stay , resolved Not D/C on medications for PCP follow-up  Ambulatory dysfunction   Ambulatory Dysfunction to SNF as per PT/OT: [facility] CT Head and C-Spine unremarkable Discharged today 100 by lack of transportation collaborated closely with multidisciplinary team    Schizophrenia (HCC) Schizophrenia Behaviors currently well-controlled Continue Depakote 750 PO BID       Subjective: Patient originally planned for discharge today, unfortunately transportation could not be arranged, patient denies any fever or chills overnight, discharge summary and discharge order voided after discussion with multidisciplinary team    Physical Exam: Vitals:   02/25/23 2014 02/26/23 0421 02/26/23 0743 02/26/23 1044  BP: 113/61 128/66 132/65 136/76  Pulse: 85 78 80 99  Resp: 16 20 18 20   Temp: (!) 97.4 F (36.3 C) 97.8 F (36.6 C) 97.8 F (36.6 C) (!) 97.3 F (36.3 C)  TempSrc:  SpO2: 100% 99% 100% 100%  Weight:      Height:       Patient seen initially at 9:30 AM and then secondarily had 12:34 PM  at the request of nursing staff given the patient's displeasure with not being discharged today Constitutional:  Vital Signs as per [facility] than three noted] No Acute Distress Eyes:  Pink Conjunctiva and no Ptosis Respiratory:      Respiratory Effort Normal: No Use of Respiratory Muscles,No  Intercostal Retractions             Lungs Clear to Auscultation Bilaterally Cardiovascular:   Heart Auscultated: Regular Regular without any added sounds or murmurs              B/L LE edema with significant hemosiderin deposition, no cellulitis, wound of LLE anterior shin above gater region Gastrointestinal:  Abdomen soft and nontender without palpable masses, guarding or rebound  No Palpable Splenomegaly or Hepatomegaly  Data Reviewed:  There are no new results to review at this time. Labs Noted in A/P  Family Communication: N/A  Disposition: Status is: Inpatient   Planned Discharge Destination: Skilled nursing facility Not on Pharm DVT PPX given his intermittent Plts   Author: Princess Bruins, MD 02/26/2023 1:10 PM  For on call review [email or web address].

## 2023-02-26 NOTE — Assessment & Plan Note (Signed)
Left Lower Extremity Cellulitis  Afebrile and WBC 5 today Patient managed intially on IV Vancomycin and Ceftriaxone then Clindamycin, total 4 days IV Abx as inpatient with marked clinical imporovement. Of note the patient was last seen for wound check following I+D as outpatient on 01/11/2023 with note at that time noting delayed wound healing but no acute concerns. No signs of collection currently or indication for repeat advanced imaging, improving on PO Clindamycin, will continue despite C. Diff risk given he has had such a good clinical response and difficulty with bactrim in past and doxy resistance patterns. Completed 4 days between IV and PO so far inpatient and should complete additional 6 days PO Clindamycin for a total of 10 days therapy

## 2023-02-26 NOTE — Assessment & Plan Note (Signed)
Prolonged QT Follow Lytes, ordered a.m. magnesium Of note patient on buspar and trazadone

## 2023-02-26 NOTE — TOC Transition Note (Addendum)
Transition of Care [facility]) - CM/SW Discharge Note   Patient Details  Name: LAYMAN ALTIER MRN: [id number] Date of Birth: May 29, 1971  Transition of Care [facility]) CM/SW Contact:  Allena Katz, LCSW Phone Number: 02/26/2023, 9:47 AM   Clinical Narrative:   Pt has orders to discharge to [facility] and rehab. Revonda Standard notified and agreeable. RN given  number for report. DC summary to be sent once in. Medical necessity printed to the unit.  Sentrell with DSS notified.     Final next level of care: Skilled Nursing Facility Barriers to Discharge: Barriers Resolved   Patient Goals and CMS Choice CMS Medicare.gov Compare Post Acute Care list provided to:: Legal Guardian    Discharge Placement                  Patient to be transferred to facility by: [facility] Name of family member notified: sentrell with dss Patient and family notified of of transfer: 02/26/23  Discharge Plan and Services Additional resources added to the After Visit Summary for                                       Social Determinants of Health (SDOH) Interventions SDOH Screenings   Food Insecurity: No Food Insecurity (02/21/2023)  Recent Concern: Food Insecurity - Food Insecurity Present (01/06/2023)  Housing: Low Risk  (02/21/2023)  Transportation Needs: Unmet Transportation Needs (02/21/2023)  Utilities: Not At Risk (02/21/2023)  Alcohol Screen: Low Risk  (03/23/2022)  Tobacco Use: Low Risk  (02/21/2023)     Readmission Risk Interventions     No data to display

## 2023-02-26 NOTE — TOC Progression Note (Signed)
Transition of Care [facility]) - Progression Note    Patient Details  Name: Derrick Atkins MRN: [id number] Date of Birth: 19-Sep-1970  [facility] of [facility] CM/SW Contact  Allena Katz, LCSW Phone Number: 02/26/2023, 11:49 AM  Clinical Narrative:   [facility] unable to transport states for [place] to try again tomorrow. Safe transport will not transport neither will [facility]. Facility notified.       Barriers to Discharge: Barriers Resolved  Expected Discharge Plan and Services         Expected Discharge Date: 02/26/23                                     Social Determinants of Health (SDOH) Interventions SDOH Screenings   Food Insecurity: No Food Insecurity (02/21/2023)  Recent Concern: Food Insecurity - Food Insecurity Present (01/06/2023)  Housing: Low Risk  (02/21/2023)  Transportation Needs: Unmet Transportation Needs (02/21/2023)  Utilities: Not At Risk (02/21/2023)  Alcohol Screen: Low Risk  (03/23/2022)  Tobacco Use: Low Risk  (02/21/2023)    Readmission Risk Interventions     No data to display

## 2023-02-26 NOTE — Progress Notes (Signed)
Patient reports given to LPN Alisha. All questions are addressed via phone.

## 2023-02-26 NOTE — Progress Notes (Signed)
Mobility Specialist - Progress Note   02/26/23 1025  Mobility  Activity Ambulated with assistance in hallway  Level of Assistance Standby assist, set-up cues, supervision of patient - no hands on  Assistive Device Front wheel walker  Distance Ambulated (ft) 180 ft  Activity Response Tolerated well  Mobility Referral Yes  $Mobility charge 1 Mobility  Mobility Specialist Start Time (ACUTE ONLY) [id number]  Mobility Specialist Stop Time (ACUTE ONLY) 1007  Mobility Specialist Time Calculation (min) (ACUTE ONLY) 16 min   Pt sitting in recliner on RA upon arrival. Pt STS and ambulates in hallway SBA with no LOB noted. Pt returns to recliner with needs in reach and chair alarm activated.   Terrilyn Saver  Mobility Specialist  02/26/23 10:26 AM

## 2023-02-26 NOTE — Plan of Care (Signed)

## 2023-02-26 NOTE — Assessment & Plan Note (Signed)
Rhabdomyolysis, suspect primarily traumatic Stopped anti-lipid agents as well during acute event Follow-up with PCP for interval challenge Resolved, currently 260

## 2023-02-26 NOTE — Assessment & Plan Note (Signed)
Anemia and Thrombocytopenia The patient is noted to have chronic thrombocytopenia Platelets noted 97 from 72 today Follow for interval addition of DVT Ppx given Plts seem to be stabilizing >50

## 2023-02-26 NOTE — Assessment & Plan Note (Signed)
  Acute Kidney Injury Secondary to over-diuresis and Bactrim Cr 0.8 today

## 2023-02-26 NOTE — Assessment & Plan Note (Signed)
The patient is noted to have chronic thrombocytopenia as well as anemia Repeat CBC in AM and PCP Follow-up Hemoglobin noted 9.9 from 9.6 today

## 2023-02-26 NOTE — Assessment & Plan Note (Signed)
  Hypothyroidism Continue Levothyroxine 

## 2023-02-26 NOTE — Assessment & Plan Note (Signed)
  Ambulatory Dysfunction to SNF as per PT/OT: [facility] CT Head and C-Spine unremarkable Discharged today 100 by lack of transportation collaborated closely with multidisciplinary team

## 2023-02-26 NOTE — Assessment & Plan Note (Signed)
Hyperlipidemia Meds on Hold as per above in setting of acute rhabdo Can challenge as outpatient

## 2023-02-26 NOTE — Discharge Summary (Deleted)
Physician Discharge Summary   Patient: Derrick Atkins MRN: [id number] DOB: July 22, 1970  Admit date:     02/21/2023  Discharge date: 02/26/23  Discharge Physician: Doy Hutching Monty Spicher   PCP: Gracelyn Nurse, MD   Recommendations at discharge:   Complete an additional 6 days of PO Clindamycin Follow-up with PCP along with repeat BMP and CBC Follow-up with Manus Rudd, [place] regarding I+D wound Wound care to full and partial thickness wounds on left LE pretibial area:  Cleanse with NS, pat dry. Cover with folded layers of xeroform gauze [place] # [id number]). Top with ABD pad and secure with a few turns of Kerlix roll gauze/paper tape. Change daily.   Discharge Diagnoses: Principal Problem:   Cellulitis of left lower extremity Active Problems:   Fall at home, initial encounter   AKI (acute kidney injury) (HCC)   Rhabdomyolysis   Type 2 diabetes mellitus without complications (HCC)   Hypoglycemia   Essential hypertension   Thrombocytopenia (HCC)   Hypothyroidism   Dyslipidemia   Iron deficiency anemia   Abnormal LFTs   Schizophrenia, paranoid, chronic (HCC)   Long QT interval  Resolved Problems:   * No resolved hospital problems. [facility] Course: 30M, hx of schizophrenia, HTN, HLD, diet-controled DM, hypothyroidism, anemia, cerebral palsy, aggressive behavior, thrombocytopenia, With history of left lower leg cellulitis with abscess, and admitted twice in June. Patient was s/p of I&D, presents with recurrent left lower extremity cellulitis.  Patient managed intially on IV Vancomycin and Ceftriaxone then Clindamycin, total 4 days IV Abx as inpatient with marked clinical imporovement. Also noted to have rhabdomyolysis likely secondary to fall , possible  anti-lipid agent contition which resolved prior to discharge. Discharged on PO Clindamycin to SNF.  Assessment and Plan: Left Lower Extremity Cellulitis  Patient managed intially on IV Vancomycin and Ceftriaxone then Clindamycin, total 4 days IV  Abx as inpatient with marked clinical imporovement. Of note the patient was last seen for wound check following I+D as outpatient on 01/11/2023 with note at that time noting delayed wound healing but no acute concerns. No signs of collection currently or indication for repeat advanced imaging, improving on PO Clindamycin, will continue despite C. Diff risk given he has had such a good clinical response and difficulty with bactrim in past and doxy resistance patterns. Completed 4 days between IV and PO inpatient and should complete additional 6 days PO Clindamycin.   Ambulatory Dysfunction to SNF as per PT/OT: [facility] CT Head and C-Spine unremarkable  DM2, as per report A1c 4.7 had hypoglycemia during stay , resolved Not D/C on medications for PCP follow-up  Acute Kidney Injury Secondary to over-diuresis and Bactrim Cr 0.8 on day of discharge  Rhabdomyolysis, suspect primarily traumatic Stopped anti-lipid agents as well during acute event Follow-up with PCP for interval challenge Resolved prior to discharge   Hypothyroidism Continue Levothyroxine  Hyperlipidemia Meds on Hold as per above  Transaminitis Improved, PCP follow-up  Prolonged QT Follow Lytes as outpatient Of note patient on buspar and trazadone  Schizophrenia Continue Depakote 750 PO BID  Anemia and Thrombocytopenia The patient is noted to have chronic thrombocytopenia as well as anemia, numbers stable at discharge. Patient should PCP along with repeat BMP and CBC.  Consultants: N/A Procedures performed: N/A Disposition: Skilled nursing facility Diet recommendation:  Thin Heart Healthy Carb Modified  DISCHARGE MEDICATION: Allergies as of 02/26/2023       Reactions   Phenytoin Sodium Extended Other (See Comments), Nausea And Vomiting   Prednisone  Hives, Nausea And Vomiting   Latex Hives, Nausea And Vomiting, Rash        Medication List     STOP taking these medications    fenofibrate 54 MG  tablet   gemfibrozil 600 MG tablet Commonly known as: LOPID   loperamide 2 MG capsule Commonly known as: IMODIUM   potassium chloride 10 MEQ tablet Commonly known as: KLOR-CON Replaced by: potassium chloride 20 MEQ packet   rosuvastatin 5 MG tablet Commonly known as: CRESTOR   sulfamethoxazole-trimethoprim 800-160 MG tablet Commonly known as: BACTRIM DS       TAKE these medications    Abilify Maintena 400 MG Prsy prefilled syringe Generic drug: ARIPiprazole ER Inject 400 mg into the muscle every 28 (twenty-eight) days.   acetaminophen 500 MG tablet Commonly known as: TYLENOL Take 1,000 mg by mouth 3 (three) times daily as needed for mild pain or moderate pain.   albuterol 108 (90 Base) MCG/ACT inhaler Commonly known as: VENTOLIN HFA Inhale 1-2 puffs into the lungs every 4 (four) hours as needed.   ascorbic acid 500 MG tablet Commonly known as: VITAMIN C Take 1 tablet (500 mg total) by mouth daily.   aspirin EC 81 MG tablet Take 1 tablet (81 mg total) by mouth daily. Swallow whole.   benztropine 1 MG tablet Commonly known as: COGENTIN Take 1 tablet (1 mg total) by mouth daily. What changed: when to take this   busPIRone 15 MG tablet Commonly known as: BUSPAR Take 15 mg by mouth 3 (three) times daily.   carboxymethylcellulose 0.5 % Soln Commonly known as: REFRESH PLUS Place 1 drop into both eyes 3 (three) times daily as needed (dry eyes).   cholecalciferol 25 MCG (1000 UNIT) tablet Commonly known as: VITAMIN D3 Take 1,000 Units by mouth daily.   clindamycin 300 MG capsule Commonly known as: CLEOCIN Take 1 capsule (300 mg total) by mouth every 8 (eight) hours for 6 days.   divalproex 250 MG DR tablet Commonly known as: DEPAKOTE Take 3 tablets (750 mg total) by mouth every 12 (twelve) hours.   folic acid 1 MG tablet Commonly known as: FOLVITE Take 1 tablet (1 mg total) by mouth daily.   furosemide 20 MG tablet Commonly known as: LASIX Take 20 mg by  mouth in the morning.   hydrOXYzine 50 MG tablet Commonly known as: ATARAX Take 1 tablet (50 mg total) by mouth every 4 (four) hours as needed for anxiety. What changed:  when to take this reasons to take this   iron polysaccharides 150 MG capsule Commonly known as: NIFEREX Take 1 capsule (150 mg total) by mouth daily.   levothyroxine 88 MCG tablet Commonly known as: SYNTHROID Take 1 tablet (88 mcg total) by mouth daily at 6 (six) AM.   OLANZapine 10 MG tablet Commonly known as: ZYPREXA Take 10 mg by mouth at bedtime. (Take with 20mg  tablet to equal 30mg  total) What changed: Another medication with the same name was changed. Make sure you understand how and when to take each.   OLANZapine 20 MG tablet Commonly known as: ZYPREXA Take 1 tablet (20 mg total) by mouth at bedtime. What changed: additional instructions   polyethylene glycol 17 g packet Commonly known as: MIRALAX / GLYCOLAX Take 17 g by mouth daily.   potassium chloride 20 MEQ packet Commonly known as: KLOR-CON Take 20 mEq by mouth daily. Replaces: potassium chloride 10 MEQ tablet   senna 8.6 MG Tabs tablet Commonly known as: SENOKOT Take 2 tablets  by mouth at bedtime.   traZODone 100 MG tablet Commonly known as: DESYREL Take 100 mg by mouth at bedtime.        Follow-up Information     Gracelyn Nurse, MD. Schedule an appointment as soon as possible for a visit in 3 day(s).   Specialty: Internal Medicine Contact information: [address] [place] [address] [phone]         Donovan Kail, PA-C. Schedule an appointment as soon as possible for a visit in 3 day(s).   Specialty: Physician Assistant Why: Follow-up I+D Wound Contact information: [address] [place] [address] [phone]                Discharge Exam: Ceasar Mons Weights   02/21/23 1424  Weight: 94.3 kg  Constitutional:  Vital Signs as per [facility] than three noted] No Acute  Distress Eyes:  Pink Conjunctiva and no Ptosis Respiratory:   Respiratory Effort Normal: No Use of Respiratory Muscles,No  Intercostal Retractions             Lungs Clear to Auscultation Bilaterally Cardiovascular:   Heart Auscultated: Regular Regular without any added sounds or murmurs              B/L LE edema with significant hemosiderin deposition, no cellulitis, wound of LLE anterior shin above gater region Gastrointestinal:  Abdomen soft and nontender without palpable masses, guarding or rebound  No Palpable Splenomegaly or Hepatomegaly   Condition at discharge: stable  The results of significant diagnostics from this hospitalization (including imaging, microbiology, ancillary and laboratory) are listed below for reference.   Imaging Studies: CT Head Wo Contrast  Result Date: 02/21/2023 CLINICAL DATA:  Mental status change, unknown cause; Neck trauma, intoxicated or obtunded (Age >= 16y) fall EXAM: CT HEAD WITHOUT CONTRAST CT CERVICAL SPINE WITHOUT CONTRAST TECHNIQUE: Multidetector CT imaging of the head and cervical spine was performed following the standard protocol without intravenous contrast. Multiplanar CT image reconstructions of the cervical spine were also generated. RADIATION DOSE REDUCTION: This exam was performed according to the departmental dose-optimization program which includes automated exposure control, adjustment of the mA and/or kV according to patient size and/or use of iterative reconstruction technique. COMPARISON:  CT head 01/18/22 FINDINGS: CT HEAD FINDINGS Brain: No evidence of acute infarction, hemorrhage, hydrocephalus, extra-axial collection or mass lesion/mass effect. Vascular: No hyperdense vessel or unexpected calcification. Skull: Normal. Negative for fracture or focal lesion. Sinuses/Orbits: No middle ear or mastoid effusion. Paranasal sinuses are clear. Orbits are right lens replacement. Other: None. CT CERVICAL SPINE FINDINGS Alignment: Trace  anterolisthesis of C4 on C5. Skull base and vertebrae: No acute fracture. No primary bone lesion or focal pathologic process. Soft tissues and spinal canal: No prevertebral fluid or swelling. No visible canal hematoma. Disc levels:  No evidence high-grade spinal canal stenosis. Upper chest: Negative. Other: None IMPRESSION: 1. No acute intracranial abnormality. 2. No acute fracture or traumatic subluxation of the cervical spine. Electronically Signed   By: Lorenza Cambridge M.D.   On: 02/21/2023 18:16   CT Cervical Spine Wo Contrast  Result Date: 02/21/2023 CLINICAL DATA:  Mental status change, unknown cause; Neck trauma, intoxicated or obtunded (Age >= 16y) fall EXAM: CT HEAD WITHOUT CONTRAST CT CERVICAL SPINE WITHOUT CONTRAST TECHNIQUE: Multidetector CT imaging of the head and cervical spine was performed following the standard protocol without intravenous contrast. Multiplanar CT image reconstructions of the cervical spine were also generated. RADIATION DOSE REDUCTION: This exam was performed according to  the departmental dose-optimization program which includes automated exposure control, adjustment of the mA and/or kV according to patient size and/or use of iterative reconstruction technique. COMPARISON:  CT head 01/18/22 FINDINGS: CT HEAD FINDINGS Brain: No evidence of acute infarction, hemorrhage, hydrocephalus, extra-axial collection or mass lesion/mass effect. Vascular: No hyperdense vessel or unexpected calcification. Skull: Normal. Negative for fracture or focal lesion. Sinuses/Orbits: No middle ear or mastoid effusion. Paranasal sinuses are clear. Orbits are right lens replacement. Other: None. CT CERVICAL SPINE FINDINGS Alignment: Trace anterolisthesis of C4 on C5. Skull base and vertebrae: No acute fracture. No primary bone lesion or focal pathologic process. Soft tissues and spinal canal: No prevertebral fluid or swelling. No visible canal hematoma. Disc levels:  No evidence high-grade spinal canal  stenosis. Upper chest: Negative. Other: None IMPRESSION: 1. No acute intracranial abnormality. 2. No acute fracture or traumatic subluxation of the cervical spine. Electronically Signed   By: Lorenza Cambridge M.D.   On: 02/21/2023 18:16   US Venous Img Lower Bilateral  Result Date: 02/21/2023 CLINICAL DATA:  Bilateral lower extremity edema EXAM: Bilateral LOWER EXTREMITY VENOUS DOPPLER ULTRASOUND TECHNIQUE: Gray-scale sonography with compression, as well as color and duplex ultrasound, were performed to evaluate the deep venous system(s) from the level of the common femoral vein through the popliteal and proximal calf veins. COMPARISON:  None Available. FINDINGS: VENOUS Normal compressibility of the common femoral, superficial femoral, and popliteal veins, as well as the visualized calf veins. Visualized portions of profunda femoral vein and great saphenous vein unremarkable. No filling defects to suggest DVT on grayscale or color Doppler imaging. Doppler waveforms show normal direction of venous flow, normal respiratory plasticity and response to augmentation. Limited views of the contralateral common femoral vein are unremarkable. OTHER Bilateral calf edema. Limitations: none IMPRESSION: Negative for acute DVT. Electronically Signed   By: Minerva Fester M.D.   On: 02/21/2023 17:56   DG Foot 2 Views Right  Result Date: 02/21/2023 CLINICAL DATA:  Pain and bruised toes. EXAM: RIGHT FOOT - 2 VIEW COMPARISON:  None Available. FINDINGS: Two views of the right foot. No evidence of fracture or dislocation. There is no evidence of arthropathy or other focal bone abnormality. Soft tissues are unremarkable. IMPRESSION: Negative right foot radiographs. Electronically Signed   By: Orvan Falconer M.D.   On: 02/21/2023 15:20    Microbiology: Results for orders placed or performed during the hospital encounter of 01/06/23  Culture, blood (routine x 2)     Status: None   Collection Time: 01/06/23 12:27 PM   Specimen:  BLOOD  Result Value Ref Range Status   Specimen Description BLOOD RIGHT ANTECUBITAL  Final   Special Requests NONE  Final   Culture   Final    NO GROWTH 5 DAYS Performed at [facility], [address], [place], [place] [address]    Report Status 01/11/2023 FINAL  Final  Culture, blood (routine x 2)     Status: None   Collection Time: 01/06/23 12:33 PM   Specimen: BLOOD  Result Value Ref Range Status   Specimen Description BLOOD LEFT ANTECUBITAL  Final   Special Requests   Final    BOTTLES DRAWN AEROBIC AND ANAEROBIC Blood Culture adequate volume   Culture   Final    NO GROWTH 5 DAYS Performed at [facility], [address] [place], [place] [address]    Report Status 01/11/2023 FINAL  Final    Labs: CBC: Recent Labs  Lab 02/21/23 1551 02/22/23 0449 02/24/23  0424 02/26/23 0413  WBC 5.9 4.0 3.7* 5.0  HGB 10.5* 9.3* 9.6* 9.9*  HCT 30.3* 27.7* 29.1* 29.4*  MCV 84.2 85.5 87.1 84.7  PLT 69* 58* 72* 97*   Basic Metabolic Panel: Recent Labs  Lab 02/21/23 1551 02/21/23 1607 02/22/23 0449 02/24/23 0424 02/26/23 0413  NA 136  --  138 139 140  K 3.6  --  3.2* 4.1 3.8  CL 96*  --  102 106 109  CO2 27  --  28 28 26   GLUCOSE 73  --  66* 104* 103*  BUN 42*  --  34* 17 17  CREATININE 2.20*  --  1.74* 0.90 0.80  CALCIUM 8.9  --  8.3* 8.3* 8.7*  MG  --  2.3  --   --   --    Liver Function Tests: Recent Labs  Lab 02/21/23 1551 02/24/23 0424  AST 325* 70*  ALT 69* 31  ALKPHOS 57 47  BILITOT 1.3* 0.7  PROT 6.4* 5.0*  ALBUMIN 3.6 2.6*   CBG: Recent Labs  Lab 02/25/23 1622 02/25/23 2016 02/25/23 2350 02/26/23 0423 02/26/23 0740  GLUCAP 89 145* 160* 102* 90    Discharge time spent: 35 Minutes.  Signed: Princess Bruins, MD [facility] 02/26/2023

## 2023-02-26 NOTE — Assessment & Plan Note (Signed)
Schizophrenia Behaviors currently well-controlled Continue Depakote 750 PO BID

## 2023-02-27 DIAGNOSIS — L03116 Cellulitis of left lower limb: Secondary | ICD-10-CM | POA: Diagnosis not present

## 2023-02-27 LAB — BASIC METABOLIC PANEL
Anion gap: 7 (ref 5–15)
BUN: 16 mg/dL (ref 6–20)
CO2: 25 mmol/L (ref 22–32)
Calcium: 8.8 mg/dL — ABNORMAL LOW (ref 8.9–10.3)
Chloride: 105 mmol/L (ref 98–111)
Creatinine, Ser: 0.77 mg/dL (ref 0.61–1.24)
GFR, Estimated: >60 mL/min (ref >60)
Glucose, Bld: 133 mg/dL — ABNORMAL HIGH (ref 70–99)
Potassium: 3.7 mmol/L (ref 3.5–5.1)
Sodium: 137 mmol/L (ref 135–145)

## 2023-02-27 LAB — GLUCOSE, CAPILLARY
Glucose-Capillary: 133 mg/dL — ABNORMAL HIGH (ref 70–99)
Glucose-Capillary: 145 mg/dL — ABNORMAL HIGH (ref 70–99)
Glucose-Capillary: 124 mg/dL — ABNORMAL HIGH (ref 70–99)
Glucose-Capillary: 110 mg/dL — ABNORMAL HIGH (ref 70–99)
Glucose-Capillary: 173 mg/dL — ABNORMAL HIGH (ref 70–99)

## 2023-02-27 LAB — CBC
HCT: 31.9 % — ABNORMAL LOW (ref 39.0–52.0)
Hemoglobin: 10.9 g/dL — ABNORMAL LOW (ref 13.0–17.0)
MCH: 29.2 pg (ref 26.0–34.0)
MCHC: 34.2 g/dL (ref 30.0–36.0)
MCV: 85.5 fL (ref 80.0–100.0)
Platelets: 101 10*3/uL — ABNORMAL LOW (ref 150–400)
RBC: 3.73 MIL/uL — ABNORMAL LOW (ref 4.22–5.81)
RDW: 13.4 % (ref 11.5–15.5)
WBC: 6.1 10*3/uL (ref 4.0–10.5)
nRBC: 0 % (ref 0.0–0.2)

## 2023-02-27 LAB — MAGNESIUM: Magnesium: 2 mg/dL (ref 1.7–2.4)

## 2023-02-27 NOTE — TOC Progression Note (Signed)
Transition of Care [facility]) - Progression Note    Patient Details  Name: Derrick Atkins MRN: [id number] Date of Birth: 26-May-1971  [facility] CM/SW Contact  Susa Simmonds, [place] Phone Number: 02/27/2023, 8:58 AM  Clinical Narrative:   CSW contacted Revonda Standard with [facility] who stated she can take patient today if EMS can transport. CSW contacted [facility] emergency and was told they can only do out of county transfer Monday- Friday.       Barriers to Discharge: Barriers Resolved  Expected Discharge Plan and Services         Expected Discharge Date: 02/26/23                                     Social Determinants of Health (SDOH) Interventions SDOH Screenings   Food Insecurity: No Food Insecurity (02/21/2023)  Recent Concern: Food Insecurity - Food Insecurity Present (01/06/2023)  Housing: Low Risk  (02/21/2023)  Transportation Needs: Unmet Transportation Needs (02/21/2023)  Utilities: Not At Risk (02/21/2023)  Alcohol Screen: Low Risk  (03/23/2022)  Tobacco Use: Low Risk  (02/21/2023)    Readmission Risk Interventions     No data to display

## 2023-02-27 NOTE — Progress Notes (Signed)
Progress Note   Patient: Derrick Atkins XBM:[id number] DOB: [birth year] DOA: 02/21/2023     6 DOS: the patient was seen and examined on 02/27/2023            Subjective:  Patient seen and examined at bedside this morning He was walking about in his room with a walker and left mid his way to the chair I informed him about the fact that transportation is not available for him today until tomorrow. I discussed with transition of care manager today. Patient denies nausea vomiting chest pain or cough  Brief hospital course: 82M, hx of schizophrenia, HTN, HLD, diet-controled DM, hypothyroidism, anemia, cerebral palsy, aggressive behavior, thrombocytopenia, With history of left lower leg cellulitis with abscess, and admitted twice in June. Patient was s/p of I&D, presents with recurrent left lower extremity cellulitis.  Patient managed intially on IV Vancomycin and Ceftriaxone then Clindamycin, total 4 days IV Abx as inpatient so far  with marked clinical imporovement. Also noted to have rhabdomyolysis likely secondary to fall , possible  anti-lipid agent contition which resolved prior to discharge. Plan is to Discharge on PO Clindamycin to SNF.   Assessment and Plan: * Cellulitis of left lower extremity Left Lower Extremity Cellulitis  Patient managed intially on IV Vancomycin and Ceftriaxone then Clindamycin, total 4 days IV Abx as inpatient with marked clinical imporovement. Of note the patient was last seen for wound check following I+D as outpatient on 01/11/2023 with note at that time noting delayed wound healing but no acute concerns. No signs of collection currently or indication for repeat advanced imaging, improving on PO Clindamycin, will continue despite C. Diff risk given he has had such a good clinical response and difficulty with bactrim in past and doxy resistance patterns. Completed 4 days between IV and PO so far inpatient and should complete additional 6 days PO Clindamycin for a  total of 10 days therapy Continue wound care Continue current antibiotics Patient pending skilled nursing facility placement after transportation is ready by tomorrow   Rhabdomyolysis-resolved Rhabdomyolysis, suspect primarily traumatic Stopped anti-lipid agents as well during acute event Follow-up with PCP for interval challenge Resolved, currently 260     AKI (acute kidney injury) (HCC) Resolved We will continue to monitor renal function intermittently   Thrombocytopenia (HCC) Anemia and Thrombocytopenia The patient is noted to have chronic thrombocytopenia Follow for interval addition of DVT Ppx given Plts seem to be stabilizing >50 No evidence of bleeding noted Continue to monitor  Hypothyroidism Continue levothyroxine   Long QT interval Prolonged QT Continue to monitor electrolytes and correct as needed Of note patient is on BuSpar and trazodone   Normocytic anemia Monitor CBC closely Hemoglobin today 10.9   Prolonged QT interval Prolonged QT Follow Lytes, ordered a.m. magnesium Of note patient on buspar and trazadone   Hyperlipidemia Hyperlipidemia Meds on Hold as per above in setting of acute rhabdo Can challenge as outpatient    DM2 (diabetes mellitus, type 2) (HCC) DM2, as per report A1c 4.7 had hypoglycemia during stay , resolved Not D/C on medications for PCP follow-up   Ambulatory dysfunction   Ambulatory Dysfunction to SNF as per PT/OT: [facility] CT Head and C-Spine unremarkable Patient was planned for discharge yesterday however not possible due to lack of transportation Transition of care manager working on this     Schizophrenia [facility]) Schizophrenia Behaviors currently well-controlled Continue Depakote 750 PO BID       Physical Exam:  Constitutional: Not in acute distress  was seen ambulating about with a walker in the room  Respiratory Effort Normal: In no acute respiratory distress normal breath sounds Cardiovascular:  S1-S2 present no murmur Gastrointestinal: Nontender soft moves with respiration Musculoskeletal: Dressing noted to the left leg clean and dry   Data Reviewed: I have reviewed patient's medical records including previous provider documentation, I have discussed the case with transition of care manager and reviewed GI documentation.  I have reviewed the vitals below as well as below labs as noted      Latest Ref Rng & Units 02/27/2023    4:38 AM 02/26/2023    4:13 AM 02/24/2023    4:24 AM  CBC  WBC 4.0 - 10.5 K/uL 6.1  5.0  3.7   Hemoglobin 13.0 - 17.0 g/dL 16.1  9.9  9.6   Hematocrit 39.0 - 52.0 % 31.9  29.4  29.1   Platelets 150 - 400 K/uL 101  97  72        Latest Ref Rng & Units 02/27/2023    4:38 AM 02/26/2023    4:13 AM 02/24/2023    4:24 AM  BMP  Glucose 70 - 99 mg/dL 096  045  409   BUN 6 - 20 mg/dL 16  17  17    Creatinine 0.61 - 1.24 mg/dL 8.11  9.14  7.82   Sodium 135 - 145 mmol/L 137  140  139   Potassium 3.5 - 5.1 mmol/L 3.7  3.8  4.1   Chloride 98 - 111 mmol/L 105  109  106   CO2 22 - 32 mmol/L 25  26  28    Calcium 8.9 - 10.3 mg/dL 8.8  8.7  8.3     Family Communication: N/A   Disposition: Status is: Inpatient    Planned Discharge Destination: Skilled nursing facility  Vitals:   02/26/23 1544 02/26/23 2003 02/27/23 0432 02/27/23 0732  BP: 127/76 127/74 132/68 (!) 117/59  Pulse: 92 91 85 89  Resp: 18 20 20 16   Temp: 98 F (36.7 C) 97.6 F (36.4 C) 99.2 F (37.3 C) 97.9 F (36.6 C)  TempSrc:  Oral Oral   SpO2: 100% 100% 100% 100%  Weight:      Height:        Author: Loyce Dys, MD 02/27/2023 10:35 AM  For on call review [email or web address].

## 2023-02-27 NOTE — Progress Notes (Signed)
Mobility Specialist - Progress Note   02/27/23 0825  Mobility  Activity Ambulated with assistance in hallway  Level of Assistance Standby assist, set-up cues, supervision of patient - no hands on  Assistive Device Front wheel walker  Distance Ambulated (ft) 180 ft  Activity Response Tolerated well  Mobility Referral Yes  $Mobility charge 1 Mobility  Mobility Specialist Start Time (ACUTE ONLY) 0801  Mobility Specialist Stop Time (ACUTE ONLY) 0815  Mobility Specialist Time Calculation (min) (ACUTE ONLY) 14 min   Pt sitting in recliner on RA upon arrival. Pt STS and ambulates in hallway SBA with no LOB noted. Pt does need VC for safe RW management. (tends to veer to right). Pt returns to recliner with needs in reach and chair alarm on.   Terrilyn Saver  Mobility Specialist  02/27/23 8:26 AM

## 2023-02-28 DIAGNOSIS — L03116 Cellulitis of left lower limb: Secondary | ICD-10-CM | POA: Diagnosis not present

## 2023-02-28 LAB — BASIC METABOLIC PANEL
Anion gap: 7 (ref 5–15)
BUN: 14 mg/dL (ref 6–20)
CO2: 27 mmol/L (ref 22–32)
Calcium: 8.8 mg/dL — ABNORMAL LOW (ref 8.9–10.3)
Chloride: 106 mmol/L (ref 98–111)
Creatinine, Ser: 0.83 mg/dL (ref 0.61–1.24)
GFR, Estimated: >60 mL/min (ref >60)
Glucose, Bld: 136 mg/dL — ABNORMAL HIGH (ref 70–99)
Potassium: 3.8 mmol/L (ref 3.5–5.1)
Sodium: 140 mmol/L (ref 135–145)

## 2023-02-28 LAB — CBC WITH DIFFERENTIAL/PLATELET
Abs Immature Granulocytes: 0.04 10*3/uL (ref 0.00–0.07)
Basophils Absolute: 0 10*3/uL (ref 0.0–0.1)
Basophils Relative: 1 %
Eosinophils Absolute: 0.6 10*3/uL — ABNORMAL HIGH (ref 0.0–0.5)
Eosinophils Relative: 11 %
HCT: 28.9 % — ABNORMAL LOW (ref 39.0–52.0)
Hemoglobin: 9.5 g/dL — ABNORMAL LOW (ref 13.0–17.0)
Immature Granulocytes: 1 %
Lymphocytes Relative: 38 %
Lymphs Abs: 2.1 10*3/uL (ref 0.7–4.0)
MCH: 28.4 pg (ref 26.0–34.0)
MCHC: 32.9 g/dL (ref 30.0–36.0)
MCV: 86.5 fL (ref 80.0–100.0)
Monocytes Absolute: 0.6 10*3/uL (ref 0.1–1.0)
Monocytes Relative: 11 %
Neutro Abs: 2.1 10*3/uL (ref 1.7–7.7)
Neutrophils Relative %: 38 %
Platelets: 105 10*3/uL — ABNORMAL LOW (ref 150–400)
RBC: 3.34 MIL/uL — ABNORMAL LOW (ref 4.22–5.81)
RDW: 13.3 % (ref 11.5–15.5)
WBC: 5.4 10*3/uL (ref 4.0–10.5)
nRBC: 0 % (ref 0.0–0.2)

## 2023-02-28 LAB — GLUCOSE, CAPILLARY
Glucose-Capillary: 112 mg/dL — ABNORMAL HIGH (ref 70–99)
Glucose-Capillary: 139 mg/dL — ABNORMAL HIGH (ref 70–99)
Glucose-Capillary: 162 mg/dL — ABNORMAL HIGH (ref 70–99)

## 2023-02-28 NOTE — TOC Transition Note (Signed)
Transition of Care [facility]) - CM/SW Discharge Note   Patient Details  Name: Derrick Atkins MRN: [id number] Date of Birth: [birth year]  Transition of Care [facility]) CM/SW Contact:  Allena Katz, LCSW Phone Number: 02/28/2023, 9:34 AM   Clinical Narrative:   Pt has orders to discharge to [facility] and rehab. Revonda Standard notified. Sentrell with dss notified. Medical necessity printed to unit. DC summary to be sent once in.     Final next level of care: Skilled Nursing Facility Barriers to Discharge: Barriers Resolved   Patient Goals and CMS Choice CMS Medicare.gov Compare Post Acute Care list provided to:: Legal Guardian    Discharge Placement                  Patient to be transferred to facility by: [facility] Name of family member notified: sentrell with dss Patient and family notified of of transfer: 02/26/23  Discharge Plan and Services Additional resources added to the After Visit Summary for                                       Social Determinants of Health (SDOH) Interventions SDOH Screenings   Food Insecurity: No Food Insecurity (02/21/2023)  Recent Concern: Food Insecurity - Food Insecurity Present (01/06/2023)  Housing: Low Risk  (02/21/2023)  Transportation Needs: Unmet Transportation Needs (02/21/2023)  Utilities: Not At Risk (02/21/2023)  Alcohol Screen: Low Risk  (03/23/2022)  Tobacco Use: Low Risk  (02/21/2023)     Readmission Risk Interventions     No data to display

## 2023-02-28 NOTE — Discharge Summary (Signed)
Physician Discharge Summary   Patient: Derrick Atkins MRN: [id number] DOB: [birth year]  Admit date:     02/21/2023  Discharge date: 02/28/23  Discharge Physician: Loyce Dys   PCP: Gracelyn Nurse, MD   Recommendations at discharge:  daily cleanse with NS followed by pat dry and placement of antimicrobial nonadherent gauze, xeroform. This is to be topped with ABD pad and secured with a fw turns of Kerlix roll gauze for atraumatic changes   Discharge Diagnoses: Cellulitis of left lower extremity Rhabdomyolysis-resolved AKI (acute kidney injury) (HCC) Thrombocytopenia (HCC) Hypothyroidism Long QT interval Normocytic anemia Prolonged QT interval Hyperlipidemia DM2 (diabetes mellitus, type 2) (HCC) Ambulatory dysfunction Schizophrenia [facility])    Hospital Course: 89M, hx of schizophrenia, HTN, HLD, diet-controled DM, hypothyroidism, anemia, cerebral palsy, aggressive behavior, thrombocytopenia, With history of left lower leg cellulitis with abscess, and admitted twice in June. Patient was s/p of I&D, presents with recurrent left lower extremity cellulitis. Patient managed intially on IV Vancomycin and Ceftriaxone then Clindamycin, total 4 days IV Abx as inpatient so far with marked clinical imporovement. Also noted to have rhabdomyolysis likely secondary to fall , possible anti-lipid agent contition which resolved prior to discharge.  Patient is to complete a total of 10-day course of antibiotics.  Last day of antibiotics will be 03/05/2023. Patient is also to follow-up with general surgery.   Consultants: Surgery Procedures performed: Incision and drainage Disposition: Skilled nursing facility Diet recommendation:  Discharge Diet Orders (From admission, onward)     Start     Ordered   02/28/23 0000  Diet - low sodium heart healthy        02/28/23 1000           Cardiac diet DISCHARGE MEDICATION: Allergies as of 02/28/2023       Reactions   Phenytoin Sodium Extended  Other (See Comments), Nausea And Vomiting   Prednisone Hives, Nausea And Vomiting   Latex Hives, Nausea And Vomiting, Rash        Medication List     STOP taking these medications    fenofibrate 54 MG tablet   gemfibrozil 600 MG tablet Commonly known as: LOPID   loperamide 2 MG capsule Commonly known as: IMODIUM   potassium chloride 10 MEQ tablet Commonly known as: KLOR-CON Replaced by: potassium chloride 20 MEQ packet   rosuvastatin 5 MG tablet Commonly known as: CRESTOR   sulfamethoxazole-trimethoprim 800-160 MG tablet Commonly known as: BACTRIM DS       TAKE these medications    Abilify Maintena 400 MG Prsy prefilled syringe Generic drug: ARIPiprazole ER Inject 400 mg into the muscle every 28 (twenty-eight) days.   acetaminophen 500 MG tablet Commonly known as: TYLENOL Take 1,000 mg by mouth 3 (three) times daily as needed for mild pain or moderate pain.   albuterol 108 (90 Base) MCG/ACT inhaler Commonly known as: VENTOLIN HFA Inhale 1-2 puffs into the lungs every 4 (four) hours as needed.   ascorbic acid 500 MG tablet Commonly known as: VITAMIN C Take 1 tablet (500 mg total) by mouth daily.   aspirin EC 81 MG tablet Take 1 tablet (81 mg total) by mouth daily. Swallow whole.   benztropine 1 MG tablet Commonly known as: COGENTIN Take 1 tablet (1 mg total) by mouth daily. What changed: when to take this   busPIRone 15 MG tablet Commonly known as: BUSPAR Take 15 mg by mouth 3 (three) times daily.   carboxymethylcellulose 0.5 % Soln Commonly known as: REFRESH PLUS Place  1 drop into both eyes 3 (three) times daily as needed (dry eyes).   cholecalciferol 25 MCG (1000 UNIT) tablet Commonly known as: VITAMIN D3 Take 1,000 Units by mouth daily.   clindamycin 300 MG capsule Commonly known as: CLEOCIN Take 1 capsule (300 mg total) by mouth every 8 (eight) hours for 6 days.   divalproex 250 MG DR tablet Commonly known as: DEPAKOTE Take 3 tablets  (750 mg total) by mouth every 12 (twelve) hours.   folic acid 1 MG tablet Commonly known as: FOLVITE Take 1 tablet (1 mg total) by mouth daily.   furosemide 20 MG tablet Commonly known as: LASIX Take 20 mg by mouth in the morning.   hydrOXYzine 50 MG tablet Commonly known as: ATARAX Take 1 tablet (50 mg total) by mouth every 4 (four) hours as needed for anxiety. What changed:  when to take this reasons to take this   iron polysaccharides 150 MG capsule Commonly known as: NIFEREX Take 1 capsule (150 mg total) by mouth daily.   levothyroxine 88 MCG tablet Commonly known as: SYNTHROID Take 1 tablet (88 mcg total) by mouth daily at 6 (six) AM.   OLANZapine 10 MG tablet Commonly known as: ZYPREXA Take 10 mg by mouth at bedtime. (Take with 20mg  tablet to equal 30mg  total) What changed: Another medication with the same name was changed. Make sure you understand how and when to take each.   OLANZapine 20 MG tablet Commonly known as: ZYPREXA Take 1 tablet (20 mg total) by mouth at bedtime. What changed: additional instructions   polyethylene glycol 17 g packet Commonly known as: MIRALAX / GLYCOLAX Take 17 g by mouth daily.   potassium chloride 20 MEQ packet Commonly known as: KLOR-CON Take 20 mEq by mouth daily. Replaces: potassium chloride 10 MEQ tablet   senna 8.6 MG Tabs tablet Commonly known as: SENOKOT Take 2 tablets by mouth at bedtime.   traZODone 100 MG tablet Commonly known as: DESYREL Take 100 mg by mouth at bedtime.               Discharge Care Instructions  (From admission, onward)           Start     Ordered   02/28/23 0000  Change dressing (specify)       Comments: daily cleanse with NS followed by pat dry and placement of antimicrobial nonadherent gauze, xeroform. This is to be topped with ABD pad and secured with a fw turns of Kerlix roll gauze for atraumatic changes   02/28/23 1000            Contact information for follow-up  providers     Gracelyn Nurse, MD. Schedule an appointment as soon as possible for a visit in 3 day(s).   Specialty: Internal Medicine Contact information: [address] [place] [address] [phone]         Donovan Kail, PA-C. Schedule an appointment as soon as possible for a visit in 3 day(s).   Specialty: Physician Assistant Why: Follow-up I+D Wound Contact information: [address] [place] [address] [phone]              Contact information for after-discharge care     Destination     [facility] .   Service: Skilled Paramedic information: [address] [place] [address] [phone]  Discharge Exam: Filed Weights   02/21/23 1424  Weight: 94.3 kg   Constitutional: Not in acute distress was seen ambulating about with a walker in the room  Respiratory Effort Normal: In no acute respiratory distress normal breath sounds Cardiovascular: S1-S2 present no murmur Gastrointestinal: Nontender soft moves with respiration Musculoskeletal: Dressing noted to the left leg clean and dry  Condition at discharge: good    Discharge time spent:  35 minutes.  Signed: Loyce Dys, MD [facility] 02/28/2023

## 2023-02-28 NOTE — Progress Notes (Signed)
Occupational Therapy Treatment Patient Details Name: Derrick Atkins MRN: [id number] DOB: [birth year] Today's Date: 02/28/2023   History of present illness Pt is a 52 y.o. male with medical history significant of schizophrenia, hypertension, hyperlipidemia, diabetes mellitus, hypothyroidism, anemia, cerebral palsy, aggressive behavior, thrombocytopenia, left lower leg cellulitis with abscess, who presents with pain in left lower leg. Multiple recent falls.   OT comments  Pt seen for OT tx. Pt eager to participate and reports being eager to discharge. Pt completed toileting in bathroom with supervision for transfer and clothing mgt, mod indep for hygiene, requiring 1 VC for RW mgt as he attempted to leave the RW to the side. Pt ambulated 160x2 with 2 brief standing rest breaks and presented with negotiating various obstacles during dual tasking requiring supervision and PRN VC for safety with RW. Pt demonstrating improvement towards goals and continues to benefit from skilled OT Services to maximize safety and independence. Discharge recommendations remain appropriate.       If plan is discharge home, recommend the following:  Assistance with feeding;Help with stairs or ramp for entrance;Assist for transportation;Direct supervision/assist for medications management;Assistance with cooking/housework;A little help with walking and/or transfers;A little help with bathing/dressing/bathroom   Equipment Recommendations  BSC/3in1;Other (comment) (defer to next venue, anticipate need for RW and [facility])    Recommendations for Other Services      Precautions / Restrictions Precautions Precautions: Fall Restrictions Weight Bearing Restrictions: No       Mobility Bed Mobility               General bed mobility comments: NT, in recliner at start and end of session    Transfers Overall transfer level: Needs assistance Equipment used: Rolling walker (2 wheels) Transfers: Sit to/from  Stand Sit to Stand: Supervision           General transfer comment: pt able to demo proper hand placement without VC     Balance Overall balance assessment: Needs assistance Sitting-balance support: Feet supported, No upper extremity supported Sitting balance-Leahy Scale: Good     Standing balance support: Bilateral upper extremity supported, During functional activity, Reliant on assistive device for balance, No upper extremity supported Standing balance-Leahy Scale: Good                             ADL either performed or assessed with clinical judgement   ADL Overall ADL's : Needs assistance/impaired                     Lower Body Dressing: Sit to/from stand;Supervision/safety   Toilet Transfer: BSC/3in1;Regular Toilet;Rolling walker (2 wheels);Supervision/safety Statistician Details (indicate cue type and reason): BSC over toilet Toileting- Clothing Manipulation and Hygiene: Supervision/safety;Modified independent Toileting - Clothing Manipulation Details (indicate cue type and reason): hygiene, supv for STS clothing mgt     Functional mobility during ADLs: Supervision/safety;Cueing for safety;Rolling walker (2 wheels)      Extremity/Trunk Assessment              Vision       Perception     Praxis      Cognition Arousal: Alert Behavior During Therapy: WFL for tasks assessed/performed Overall Cognitive Status: Within Functional Limits for tasks assessed  Exercises Other Exercises Other Exercises: Pt ambulated 160x2 with 2 brief standing rest breaks and presented with negotiating various obstacles during dual tasking requiring supervision and PRN VC for safety with RW.    Shoulder Instructions       General Comments      Pertinent Vitals/ Pain       Pain Assessment Pain Assessment: 0-10 Pain Score: 2  Pain Location: R great toe Pain Intervention(s): Limited  activity within patient's tolerance, Monitored during session, Premedicated before session, Repositioned  Home Living                                          Prior Functioning/Environment              Frequency  Min 1X/week        Progress Toward Goals  OT Goals(current goals can now be found in the care plan section)  Progress towards OT goals: Progressing toward goals  Acute Rehab OT Goals Patient Stated Goal: be in a good mood again OT Goal Formulation: With patient Time For Goal Achievement: 03/08/23 Potential to Achieve Goals: Good  Plan      Co-evaluation                 AM-PAC OT "6 Clicks" Daily Activity     Outcome Measure   Help from another person eating meals?: None Help from another person taking care of personal grooming?: A Little Help from another person toileting, which includes using toliet, bedpan, or urinal?: A Little Help from another person bathing (including washing, rinsing, drying)?: A Lot Help from another person to put on and taking off regular upper body clothing?: A Little Help from another person to put on and taking off regular lower body clothing?: A Lot 6 Click Score: 17    End of Session Equipment Utilized During Treatment: Rolling walker (2 wheels)  OT Visit Diagnosis: Other abnormalities of gait and mobility (R26.89);Repeated falls (R29.6);Muscle weakness (generalized) (M62.81);Pain Pain - Right/Left: Right Pain - part of body: Ankle and joints of foot   Activity Tolerance Patient tolerated treatment well   Patient Left in chair;with call bell/phone within reach;with chair alarm set   Nurse Communication          Time: 2361089623 OT Time Calculation (min): 19 min  Charges: OT General Charges $OT Visit: 1 Visit OT Treatments $Therapeutic Activity: 8-22 mins  Arman Filter., MPH, MS, OTR/L ascom [phone] 02/28/23, 9:36 AM

## 2023-02-28 NOTE — Progress Notes (Signed)
Mobility Specialist - Progress Note   02/28/23 0801  Mobility  Activity Ambulated with assistance in hallway  Level of Assistance Standby assist, set-up cues, supervision of patient - no hands on  Assistive Device Front wheel walker  Distance Ambulated (ft) 180 ft  Activity Response Tolerated well  Mobility Referral Yes  $Mobility charge 1 Mobility  Mobility Specialist Start Time (ACUTE ONLY) 0744  Mobility Specialist Stop Time (ACUTE ONLY) 0801  Mobility Specialist Time Calculation (min) (ACUTE ONLY) 17 min   Pt sitting in recliner on RA upon arrival. Pt STS and ambulates in hallway Supervision with no physical assistance needed. Pt needs VC for safe RW managament.Pt returns to recliner with needs in reach and bed alarm activated.   Terrilyn Saver  Mobility Specialist  02/28/23 8:03 AM

## 2023-03-10 ENCOUNTER — Ambulatory Visit: Payer: 59 | Admitting: Physician Assistant

## 2023-03-17 ENCOUNTER — Ambulatory Visit (INDEPENDENT_AMBULATORY_CARE_PROVIDER_SITE_OTHER): Payer: 59 | Admitting: Physician Assistant

## 2023-03-17 ENCOUNTER — Encounter: Payer: Self-pay | Admitting: Physician Assistant

## 2023-03-17 VITALS — BP 130/83 | HR 112 | Temp 98.2°F | Ht 75.0 in | Wt 198.8 lb

## 2023-03-17 DIAGNOSIS — Z09 Encounter for follow-up examination after completed treatment for conditions other than malignant neoplasm: Secondary | ICD-10-CM

## 2023-03-17 DIAGNOSIS — L03116 Cellulitis of left lower limb: Secondary | ICD-10-CM

## 2023-03-17 DIAGNOSIS — L02416 Cutaneous abscess of left lower limb: Secondary | ICD-10-CM

## 2023-03-17 NOTE — Patient Instructions (Signed)
The wounds on your leg are well healed. No need for any further dressings.    Follow-up with our office as needed.  Please call and ask to speak with a nurse if you develop questions or concerns.

## 2023-03-17 NOTE — Progress Notes (Signed)
[facility] NOTE  03/22/2023  History of Present Illness: Derrick Atkins is a 52 y.o. male known to our service following an initial presentation for left lower leg cellulitis in June of 2024. He did require I&D on 12/20/2022 with Dr Claudine Mouton. He initially did well in follow up; last seen 07/02. Unfortunately, he was readmitted from 08/12 - 08/19 with recurrent LLE cellulitis. He was treated with IV Vancomycin and Ceftriaxone then Clindamycin. During this admission he was seen by wound care with the following recommendations, "Nursing with guidance for the topical care of these wounds using a daily cleanse with NS followed by pat dry and placement of antimicrobial nonadherent gauze, xeroform. This is to be topped with ABD pad and secured with a fw turns of Kerlix roll gauze for atraumatic changes." He did not require any additional debridements. Surgery was not consulted during this admission.  He presents today for follow up of his LLE cellulitis and previous wounds. He has been doing well at his group home. They have been doing dressing changes for him. He believes these wounds have healed. No fever, chills. Previous LLE swelling improved. He is on Lasix. No other complaints.    Past Medical History: Past Medical History:  Diagnosis Date   Cerebral palsy (HCC)    Depression    Diabetes mellitus without complication (HCC)    Hypertension    Kidney stones    Schizoaffective disorder (HCC)    Scoliosis      Past Surgical History: Past Surgical History:  Procedure Laterality Date   BOWEL RESECTION     CATARACT EXTRACTION W/PHACO Right 09/15/2022   Procedure: CATARACT EXTRACTION PHACO AND INTRAOCULAR LENS PLACEMENT (IOC) RIGHT DIABETIC VISION BLUE HEALON 5  15.79  01:31.9;  Surgeon: Lockie Mola, MD;  Location: [facility];  Service: Ophthalmology;  Laterality: Right;  Latex Diabetic   INCISION AND DRAINAGE ABSCESS Left 12/20/2022    Procedure: INCISION AND DRAINAGE ABSCESS;  Surgeon: Campbell Lerner, MD;  Location: [facility];  Service: General;  Laterality: Left;   LAPAROTOMY N/A 05/15/2020   Procedure: EXPLORATORY LAPAROTOMY;  Surgeon: Henrene Dodge, MD;  Location: [facility];  Service: General;  Laterality: N/A;    Home Medications: Prior to Admission medications   Medication Sig Start Date End Date Taking? Authorizing Provider  ABILIFY MAINTENA 400 MG PRSY prefilled syringe Inject 400 mg into the muscle every 28 (twenty-eight) days.   Yes [provider]  acetaminophen (TYLENOL) 500 MG tablet Take 1,000 mg by mouth 3 (three) times daily as needed for mild pain or moderate pain.   Yes [provider]  ascorbic acid (VITAMIN C) 500 MG tablet Take 1 tablet (500 mg total) by mouth daily. 12/24/22  Yes Marcelino Duster, MD  aspirin EC 81 MG tablet Take 1 tablet (81 mg total) by mouth daily. Swallow whole. 04/09/22  Yes [name], [name], MD  benztropine (COGENTIN) 1 MG tablet Take 1 tablet (1 mg total) by mouth daily. Patient taking differently: Take 1 mg by mouth 2 (two) times daily. 04/08/22  Yes [name], [name], MD  busPIRone (BUSPAR) 15 MG tablet Take 15 mg by mouth 3 (three) times daily. 12/11/22  Yes [provider]  cholecalciferol (VITAMIN D3) 25 MCG (1000 UNIT) tablet Take 1,000 Units by mouth daily.   Yes [provider]  divalproex (DEPAKOTE) 250 MG DR tablet Take 3 tablets (750 mg total) by mouth every 12 (twelve) hours. 04/08/22  Yes [name], [name], MD  fenofibrate 54 MG tablet Take 54 mg by mouth daily.   Yes [provider]  folic acid (FOLVITE) 1 MG tablet Take 1 tablet (1 mg total) by mouth daily. 12/24/22  Yes Marcelino Duster, MD  furosemide (LASIX) 20 MG tablet Take 20 mg by mouth in the morning.   Yes [provider]  gemfibrozil (LOPID) 600 MG tablet Take 600 mg by mouth 2 (two) times daily before a meal.   Yes [provider]  iron  polysaccharides (NIFEREX) 150 MG capsule Take 1 capsule (150 mg total) by mouth daily. 12/24/22  Yes Marcelino Duster, MD  levothyroxine (SYNTHROID) 88 MCG tablet Take 1 tablet (88 mcg total) by mouth daily at 6 (six) AM. 04/09/22  Yes [name], [name], MD  OLANZapine (ZYPREXA) 10 MG tablet Take 10 mg by mouth at bedtime. (Take with 20mg  tablet to equal 30mg  total)   Yes [provider]  OLANZapine (ZYPREXA) 20 MG tablet Take 1 tablet (20 mg total) by mouth at bedtime. Patient taking differently: Take 20 mg by mouth at bedtime. (Take with 10mg  tablet to equal 30mg  total) 04/08/22  Yes [name], [name], MD  polyethylene glycol (MIRALAX / GLYCOLAX) 17 g packet Take 17 g by mouth daily.   Yes [provider]  potassium chloride (KLOR-CON) 20 MEQ packet Take 20 mEq by mouth daily. 02/26/23 03/28/23 Yes Core, Doy Hutching, MD  rosuvastatin (CRESTOR) 5 MG tablet Take 5 mg by mouth daily.   Yes [provider]  senna (SENOKOT) 8.6 MG TABS tablet Take 2 tablets by mouth at bedtime.   Yes [provider]  traZODone (DESYREL) 100 MG tablet Take 100 mg by mouth at bedtime.   Yes [provider]    Allergies: Allergies  Allergen Reactions   Phenytoin Sodium Extended Other (See Comments) and Nausea And Vomiting   Prednisone Hives and Nausea And Vomiting   Latex Hives, Nausea And Vomiting and Rash    Review of Systems: Review of Systems  Constitutional:  Negative for chills and fever.  HENT:  Negative for congestion and sore throat.   Cardiovascular:  Negative for chest pain and palpitations.  Gastrointestinal:  Negative for nausea and vomiting.  Skin:  Negative for itching and rash.       + LLE Cellulitis (resolved)  All other systems reviewed and are negative.   Physical Exam BP 130/83   Pulse (!) 112   Temp 98.2 F (36.8 C)   Ht 6\' 3"  (1.905 m)   Wt 198 lb 12.8 oz (90.2 kg)   SpO2 100%   BMI 24.85 kg/m   Physical Exam Vitals and nursing note  reviewed. Exam conducted with a chaperone present.  Constitutional:      General: He is not in acute distress.    Appearance: Normal appearance. He is normal weight.     Comments: NAD; Group home member at bedside   HENT:     Head: Normocephalic and atraumatic.  Eyes:     General: No scleral icterus.    Conjunctiva/sclera: Conjunctivae normal.  Cardiovascular:     Rate and Rhythm: Regular rhythm. Tachycardia present.     Pulses: Normal pulses.  Pulmonary:     Effort: Pulmonary effort is normal. No respiratory distress.  Genitourinary:    Comments: Deferred Musculoskeletal:     Right lower leg: Edema (Trace) present.     Left lower leg: Edema (Trace) present.  Skin:    Comments: Previous LLE wounds are now completely healed;  no erythema. Swelling previously noted is markedly improved.   Neurological:     General: No focal deficit present.     Mental Status: He is alert and oriented to person, place, and time. Mental status is at baseline.     Labs/Imaging: No new pertinent imaging studies   Assessment and Plan: This is a 52 y.o. male now resolved LLE cellulitis   - LLE cellulitis resolved; all wounds are now healed  - No longer any need for dressing changes  - Would continue diuretic to manage lower extremity swelling  - Nothing further needed from surgical perspective. We will be happy to see him on as needed basis.   Face-to-face time spent with the patient and care providers was 20 minutes, with more than 50% of the time spent counseling, educating, and coordinating care of the patient.     Lynden Oxford, PA-C [facility] 03/22/2023, 2:31 PM M-F: 7am - 4pm

## 2023-04-06 ENCOUNTER — Other Ambulatory Visit: Payer: Self-pay

## 2023-04-06 ENCOUNTER — Encounter: Payer: Self-pay | Admitting: *Deleted

## 2023-04-06 ENCOUNTER — Emergency Department
Admission: EM | Admit: 2023-04-06 | Discharge: 2023-04-06 | Disposition: A | Discharge status: Home / Self Care | Payer: 59 | Attending: Emergency Medicine | Admitting: Emergency Medicine

## 2023-04-06 DIAGNOSIS — L219 Seborrheic dermatitis, unspecified: Secondary | ICD-10-CM | POA: Insufficient documentation

## 2023-04-06 DIAGNOSIS — R21 Rash and other nonspecific skin eruption: Secondary | ICD-10-CM | POA: Diagnosis present

## 2023-04-06 DIAGNOSIS — I10 Essential (primary) hypertension: Secondary | ICD-10-CM | POA: Diagnosis not present

## 2023-04-06 DIAGNOSIS — E119 Type 2 diabetes mellitus without complications: Secondary | ICD-10-CM | POA: Insufficient documentation

## 2023-04-06 NOTE — ED Triage Notes (Signed)
Pt is here for evaluation of rash, brought in by his Child psychotherapist.  Pt reports that he has had similar rashes since he was a child.  No fever

## 2023-04-06 NOTE — Discharge Instructions (Addendum)
Your rash is consistent with seborrheic dermatitis, commonly known as dandruff.  Please wash the affected area with a shampoo containing ketoconazole, common brands include Selsun Blue and Nizoral.  This will need to be done consistently for symptom resolution.

## 2023-04-06 NOTE — ED Provider Notes (Signed)
[facility] Provider Note    Event Date/Time   First MD Initiated Contact with Patient 04/06/23 1128     (approximate)   History   Rash   HPI   Derrick Atkins is a 52 y.o. male with PMH of cerebral palsy, diabetes, hypertension, depression and schizoaffective disorder presents for evaluation of a rash.  Patient states is not something that he has had all his life that occasionally flares.  Patient states the rash is on his feet and it is itchy.  Patient's social worker said that they tried an antibacterial soap yesterday to see if that would help.  No fevers with the rash.     Physical Exam   Triage Vital Signs: ED Triage Vitals  Encounter Vitals Group     BP 04/06/23 1118 (!) 170/75     Systolic BP Percentile --      Diastolic BP Percentile --      Pulse Rate 04/06/23 1118 90     Resp 04/06/23 1118 16     Temp 04/06/23 1118 97.8 F (36.6 C)     Temp Source 04/06/23 1118 Oral     SpO2 04/06/23 1118 97 %     Weight 04/06/23 1129 198 lb 10.2 oz (90.1 kg)     Height 04/06/23 1129 6\' 3"  (1.905 m)     Head Circumference --      Peak Flow --      Pain Score 04/06/23 1119 0     Pain Loc --      Pain Education --      Exclude from Growth Chart --     Most recent vital signs: Vitals:   04/06/23 1118  BP: (!) 170/75  Pulse: 90  Resp: 16  Temp: 97.8 F (36.6 C)  SpO2: 97%    General: Awake, no distress.  CV:  Good peripheral perfusion.  RRR. Resp:  Normal effort.  CTAB. Abd:  No distention.  Other:  Well-demarcated, erythematous plaque with greasy yellowish scales within patient's facial hair and extending onto his scalp.   ED Results / Procedures / Treatments   Labs (all labs ordered are listed, but only abnormal results are displayed) Labs Reviewed - No data to display  PROCEDURES:  Critical Care performed: No  Procedures   MEDICATIONS ORDERED IN ED: Medications - No data to display   IMPRESSION / MDM / ASSESSMENT AND  PLAN / ED COURSE  I reviewed the triage vital signs and the nursing notes.                             52 year old male presents for evaluation of a facial rash.  Vital signs stable in triage aside from an elevated blood pressure, but patient does have a history of hypertension.  Patient NAD on exam.  Differential diagnosis includes, but is not limited to, seborrheic dermatitis, psoriasis, allergic dermatitis, contact dermatitis.  Patient's presentation is most consistent with acute, uncomplicated illness.  Patient's rash is consistent with seborrheic dermatitis.  I recommended patient use a ketoconazole based shampoo.  Patient and social worker voiced understanding, all questions were answered and he was stable at discharge.      FINAL CLINICAL IMPRESSION(S) / ED DIAGNOSES   Final diagnoses:  Seborrheic dermatitis     Rx / DC Orders   ED Discharge Orders     None        Note:  This document  was prepared using Conservation officer, historic buildings and may include unintentional dictation errors.   Cameron Ali, PA-C 04/06/23 1156    Jene Every, MD 04/06/23 1220

## 2023-04-12 ENCOUNTER — Other Ambulatory Visit: Payer: Self-pay

## 2023-04-12 ENCOUNTER — Emergency Department: Payer: 59

## 2023-04-12 ENCOUNTER — Inpatient Hospital Stay: Payer: 59

## 2023-04-12 ENCOUNTER — Inpatient Hospital Stay
Admission: EM | Admit: 2023-04-12 | Discharge: 2023-04-18 | DRG: 637 | Disposition: A | Discharge status: Skilled Nursing Facility | Payer: 59 | Attending: Internal Medicine | Admitting: Internal Medicine

## 2023-04-12 DIAGNOSIS — Z8249 Family history of ischemic heart disease and other diseases of the circulatory system: Secondary | ICD-10-CM | POA: Diagnosis not present

## 2023-04-12 DIAGNOSIS — Z7982 Long term (current) use of aspirin: Secondary | ICD-10-CM | POA: Diagnosis not present

## 2023-04-12 DIAGNOSIS — R4182 Altered mental status, unspecified: Secondary | ICD-10-CM

## 2023-04-12 DIAGNOSIS — Z888 Allergy status to other drugs, medicaments and biological substances status: Secondary | ICD-10-CM | POA: Diagnosis not present

## 2023-04-12 DIAGNOSIS — R112 Nausea with vomiting, unspecified: Principal | ICD-10-CM

## 2023-04-12 DIAGNOSIS — I10 Essential (primary) hypertension: Secondary | ICD-10-CM | POA: Diagnosis present

## 2023-04-12 DIAGNOSIS — E039 Hypothyroidism, unspecified: Secondary | ICD-10-CM | POA: Diagnosis present

## 2023-04-12 DIAGNOSIS — Z7984 Long term (current) use of oral hypoglycemic drugs: Secondary | ICD-10-CM

## 2023-04-12 DIAGNOSIS — E162 Hypoglycemia, unspecified: Secondary | ICD-10-CM | POA: Diagnosis present

## 2023-04-12 DIAGNOSIS — F200 Paranoid schizophrenia: Secondary | ICD-10-CM | POA: Diagnosis present

## 2023-04-12 DIAGNOSIS — I700 Atherosclerosis of aorta: Secondary | ICD-10-CM | POA: Diagnosis present

## 2023-04-12 DIAGNOSIS — Z79890 Hormone replacement therapy (postmenopausal): Secondary | ICD-10-CM

## 2023-04-12 DIAGNOSIS — G9341 Metabolic encephalopathy: Secondary | ICD-10-CM | POA: Diagnosis present

## 2023-04-12 DIAGNOSIS — Z9049 Acquired absence of other specified parts of digestive tract: Secondary | ICD-10-CM | POA: Diagnosis not present

## 2023-04-12 DIAGNOSIS — M7989 Other specified soft tissue disorders: Secondary | ICD-10-CM | POA: Diagnosis present

## 2023-04-12 DIAGNOSIS — F32A Depression, unspecified: Secondary | ICD-10-CM | POA: Diagnosis present

## 2023-04-12 DIAGNOSIS — E785 Hyperlipidemia, unspecified: Secondary | ICD-10-CM | POA: Diagnosis present

## 2023-04-12 DIAGNOSIS — N200 Calculus of kidney: Secondary | ICD-10-CM | POA: Diagnosis present

## 2023-04-12 DIAGNOSIS — G9340 Encephalopathy, unspecified: Secondary | ICD-10-CM | POA: Diagnosis not present

## 2023-04-12 DIAGNOSIS — G809 Cerebral palsy, unspecified: Secondary | ICD-10-CM | POA: Diagnosis present

## 2023-04-12 DIAGNOSIS — D696 Thrombocytopenia, unspecified: Secondary | ICD-10-CM | POA: Diagnosis present

## 2023-04-12 DIAGNOSIS — E11649 Type 2 diabetes mellitus with hypoglycemia without coma: Principal | ICD-10-CM | POA: Diagnosis present

## 2023-04-12 DIAGNOSIS — D61818 Other pancytopenia: Secondary | ICD-10-CM | POA: Diagnosis present

## 2023-04-12 DIAGNOSIS — R9431 Abnormal electrocardiogram [ECG] [EKG]: Secondary | ICD-10-CM | POA: Diagnosis not present

## 2023-04-12 DIAGNOSIS — F209 Schizophrenia, unspecified: Secondary | ICD-10-CM | POA: Diagnosis present

## 2023-04-12 DIAGNOSIS — N179 Acute kidney failure, unspecified: Secondary | ICD-10-CM | POA: Diagnosis present

## 2023-04-12 DIAGNOSIS — Z91040 Latex allergy status: Secondary | ICD-10-CM

## 2023-04-12 DIAGNOSIS — E1165 Type 2 diabetes mellitus with hyperglycemia: Secondary | ICD-10-CM | POA: Diagnosis not present

## 2023-04-12 DIAGNOSIS — Z79899 Other long term (current) drug therapy: Secondary | ICD-10-CM | POA: Diagnosis not present

## 2023-04-12 DIAGNOSIS — E119 Type 2 diabetes mellitus without complications: Secondary | ICD-10-CM

## 2023-04-12 LAB — AMMONIA: Ammonia: 175 umol/L — ABNORMAL HIGH (ref 9–35)

## 2023-04-12 LAB — CBC WITH DIFFERENTIAL/PLATELET
Abs Immature Granulocytes: 0.02 10*3/uL (ref 0.00–0.07)
Basophils Absolute: 0 10*3/uL (ref 0.0–0.1)
Basophils Relative: 1 %
Eosinophils Absolute: 0.1 10*3/uL (ref 0.0–0.5)
Eosinophils Relative: 1 %
HCT: 36.2 % — ABNORMAL LOW (ref 39.0–52.0)
Hemoglobin: 12.3 g/dL — ABNORMAL LOW (ref 13.0–17.0)
Immature Granulocytes: 1 %
Lymphocytes Relative: 40 %
Lymphs Abs: 1.4 10*3/uL (ref 0.7–4.0)
MCH: 28.2 pg (ref 26.0–34.0)
MCHC: 34 g/dL (ref 30.0–36.0)
MCV: 83 fL (ref 80.0–100.0)
Monocytes Absolute: 0.3 10*3/uL (ref 0.1–1.0)
Monocytes Relative: 8 %
Neutro Abs: 1.8 10*3/uL (ref 1.7–7.7)
Neutrophils Relative %: 49 %
Platelets: 34 10*3/uL — ABNORMAL LOW (ref 150–400)
RBC: 4.36 MIL/uL (ref 4.22–5.81)
RDW: 13.7 % (ref 11.5–15.5)
Smear Review: NORMAL
WBC: 3.6 10*3/uL — ABNORMAL LOW (ref 4.0–10.5)
nRBC: 0 % (ref 0.0–0.2)

## 2023-04-12 LAB — HEPATIC FUNCTION PANEL
ALT: 10 U/L (ref 0–44)
AST: 23 U/L (ref 15–41)
Albumin: 3.7 g/dL (ref 3.5–5.0)
Alkaline Phosphatase: 62 U/L (ref 38–126)
Bilirubin, Direct: 0.8 mg/dL — ABNORMAL HIGH (ref 0.0–0.2)
Indirect Bilirubin: 1.4 mg/dL — ABNORMAL HIGH (ref 0.3–0.9)
Total Bilirubin: 2.2 mg/dL — ABNORMAL HIGH (ref 0.3–1.2)
Total Protein: 6.5 g/dL (ref 6.5–8.1)

## 2023-04-12 LAB — CBC
HCT: 36.9 % — ABNORMAL LOW (ref 39.0–52.0)
Hemoglobin: 12.2 g/dL — ABNORMAL LOW (ref 13.0–17.0)
MCH: 27.8 pg (ref 26.0–34.0)
MCHC: 33.1 g/dL (ref 30.0–36.0)
MCV: 84.1 fL (ref 80.0–100.0)
Platelets: 35 10*3/uL — ABNORMAL LOW (ref 150–400)
RBC: 4.39 MIL/uL (ref 4.22–5.81)
RDW: 13.6 % (ref 11.5–15.5)
WBC: 3.7 10*3/uL — ABNORMAL LOW (ref 4.0–10.5)
nRBC: 0 % (ref 0.0–0.2)

## 2023-04-12 LAB — CBG MONITORING, ED
Glucose-Capillary: 106 mg/dL — ABNORMAL HIGH (ref 70–99)
Glucose-Capillary: 96 mg/dL (ref 70–99)
Glucose-Capillary: 70 mg/dL (ref 70–99)
Glucose-Capillary: 33 mg/dL — CL (ref 70–99)
Glucose-Capillary: 151 mg/dL — ABNORMAL HIGH (ref 70–99)
Glucose-Capillary: 106 mg/dL — ABNORMAL HIGH (ref 70–99)
Glucose-Capillary: 108 mg/dL — ABNORMAL HIGH (ref 70–99)

## 2023-04-12 LAB — BASIC METABOLIC PANEL
Anion gap: 15 (ref 5–15)
BUN: 69 mg/dL — ABNORMAL HIGH (ref 6–20)
CO2: 30 mmol/L (ref 22–32)
Calcium: 9.8 mg/dL (ref 8.9–10.3)
Chloride: 96 mmol/L — ABNORMAL LOW (ref 98–111)
Creatinine, Ser: 3.64 mg/dL — ABNORMAL HIGH (ref 0.61–1.24)
GFR, Estimated: 19 mL/min — ABNORMAL LOW (ref >60)
Glucose, Bld: 38 mg/dL — CL (ref 70–99)
Potassium: 5 mmol/L (ref 3.5–5.1)
Sodium: 141 mmol/L (ref 135–145)

## 2023-04-12 LAB — GLUCOSE, RANDOM: Glucose, Bld: 130 mg/dL — ABNORMAL HIGH (ref 70–99)

## 2023-04-12 LAB — MAGNESIUM: Magnesium: 2.8 mg/dL — ABNORMAL HIGH (ref 1.7–2.4)

## 2023-04-12 LAB — TROPONIN I (HIGH SENSITIVITY)
Troponin I (High Sensitivity): 10 ng/L (ref <18)
Troponin I (High Sensitivity): 6 ng/L (ref <18)

## 2023-04-12 LAB — PHOSPHORUS: Phosphorus: 5 mg/dL — ABNORMAL HIGH (ref 2.5–4.6)

## 2023-04-12 LAB — GLUCOSE, CAPILLARY: Glucose-Capillary: 114 mg/dL — ABNORMAL HIGH (ref 70–99)

## 2023-04-12 LAB — IMMATURE PLATELET FRACTION: Immature Platelet Fraction: 4.7 % (ref 1.2–8.6)

## 2023-04-12 LAB — CK: Total CK: 98 U/L (ref 49–397)

## 2023-04-12 MED ORDER — TRAZODONE HCL 100 MG PO TABS
100.0000 mg | ORAL_TABLET | Freq: Every day | ORAL | Status: DC
  Administered 2023-04-13: 100 mg via ORAL
  Filled 2023-04-12: qty 1

## 2023-04-12 MED ORDER — CHLORHEXIDINE GLUCONATE CLOTH 2 % EX PADS
6.0000 | MEDICATED_PAD | Freq: Every day | CUTANEOUS | Status: DC
  Administered 2023-04-13 – 2023-04-15 (×3): 6 via TOPICAL

## 2023-04-12 MED ORDER — OLANZAPINE 10 MG PO TABS: 20.0000 mg | ORAL_TABLET | Freq: Every day | ORAL | Status: DC

## 2023-04-12 MED ORDER — ACETAMINOPHEN 325 MG PO TABS
650.0000 mg | ORAL_TABLET | Freq: Four times a day (QID) | ORAL | Status: DC | PRN
  Administered 2023-04-14 – 2023-04-17 (×3): 650 mg via ORAL
  Filled 2023-04-12 (×3): qty 2

## 2023-04-12 MED ORDER — BENZTROPINE MESYLATE 1 MG PO TABS
1.0000 mg | ORAL_TABLET | Freq: Every day | ORAL | Status: DC
  Administered 2023-04-13: 1 mg via ORAL
  Filled 2023-04-12 (×2): qty 1

## 2023-04-12 MED ORDER — ACETAMINOPHEN 650 MG RE SUPP: 650.0000 mg | Freq: Four times a day (QID) | RECTAL | Status: DC | PRN

## 2023-04-12 MED ORDER — VITAMIN C 500 MG PO TABS
500.0000 mg | ORAL_TABLET | Freq: Every day | ORAL | Status: DC
  Administered 2023-04-13 – 2023-04-18 (×5): 500 mg via ORAL
  Filled 2023-04-12 (×5): qty 1

## 2023-04-12 MED ORDER — DEXTROSE 50 % IV SOLN
INTRAVENOUS | Status: AC
  Administered 2023-04-12: 50 mL via INTRAVENOUS
  Filled 2023-04-12: qty 50

## 2023-04-12 MED ORDER — ONDANSETRON HCL 4 MG/2ML IJ SOLN
4.0000 mg | Freq: Four times a day (QID) | INTRAMUSCULAR | Status: AC | PRN
  Administered 2023-04-16: 4 mg via INTRAVENOUS
  Filled 2023-04-12: qty 2

## 2023-04-12 MED ORDER — DEXTROSE 50 % IV SOLN: 1.0000 | Freq: Once | INTRAVENOUS | Status: AC

## 2023-04-12 MED ORDER — ROSUVASTATIN CALCIUM 10 MG PO TABS
5.0000 mg | ORAL_TABLET | Freq: Every day | ORAL | Status: DC
  Administered 2023-04-13 – 2023-04-18 (×5): 5 mg via ORAL
  Filled 2023-04-12 (×6): qty 1

## 2023-04-12 MED ORDER — VITAMIN D 25 MCG (1000 UNIT) PO TABS
1000.0000 [IU] | ORAL_TABLET | Freq: Every day | ORAL | Status: DC
  Administered 2023-04-13 – 2023-04-18 (×5): 1000 [IU] via ORAL
  Filled 2023-04-12 (×5): qty 1

## 2023-04-12 MED ORDER — OLANZAPINE 10 MG PO TABS
30.0000 mg | ORAL_TABLET | Freq: Every day | ORAL | Status: DC
  Administered 2023-04-13 (×2): 30 mg via ORAL
  Filled 2023-04-12 (×3): qty 3

## 2023-04-12 MED ORDER — ONDANSETRON HCL 4 MG PO TABS: 4.0000 mg | ORAL_TABLET | Freq: Four times a day (QID) | ORAL | Status: AC | PRN

## 2023-04-12 MED ORDER — DEXTROSE 10 % IV SOLN: INTRAVENOUS | Status: DC

## 2023-04-12 MED ORDER — SODIUM CHLORIDE 0.9 % IV BOLUS
1000.0000 mL | Freq: Once | INTRAVENOUS | Status: AC
  Administered 2023-04-12: 1000 mL via INTRAVENOUS

## 2023-04-12 MED ORDER — SENNOSIDES-DOCUSATE SODIUM 8.6-50 MG PO TABS
1.0000 | ORAL_TABLET | Freq: Every evening | ORAL | Status: DC | PRN
  Administered 2023-04-14: 1 via ORAL
  Filled 2023-04-12: qty 1

## 2023-04-12 MED ORDER — HYDRALAZINE HCL 20 MG/ML IJ SOLN: 5.0000 mg | Freq: Four times a day (QID) | INTRAMUSCULAR | Status: AC | PRN

## 2023-04-12 MED ORDER — ONDANSETRON HCL 4 MG/2ML IJ SOLN
4.0000 mg | INTRAMUSCULAR | Status: AC
  Administered 2023-04-12: 4 mg via INTRAVENOUS
  Filled 2023-04-12: qty 2

## 2023-04-12 MED ORDER — LEVOTHYROXINE SODIUM 88 MCG PO TABS
88.0000 ug | ORAL_TABLET | Freq: Every day | ORAL | Status: DC
  Administered 2023-04-13 – 2023-04-18 (×5): 88 ug via ORAL
  Filled 2023-04-12 (×6): qty 1

## 2023-04-12 MED ORDER — DIVALPROEX SODIUM 250 MG PO DR TAB
750.0000 mg | DELAYED_RELEASE_TABLET | Freq: Two times a day (BID) | ORAL | Status: DC
  Administered 2023-04-13: 750 mg via ORAL
  Filled 2023-04-12 (×2): qty 3

## 2023-04-12 MED ORDER — MORPHINE SULFATE (PF) 4 MG/ML IV SOLN
4.0000 mg | Freq: Once | INTRAVENOUS | Status: AC
  Administered 2023-04-12: 4 mg via INTRAVENOUS
  Filled 2023-04-12: qty 1

## 2023-04-12 NOTE — ED Provider Notes (Signed)
[facility] Provider Note    Event Date/Time   First MD Initiated Contact with Patient 04/12/23 1616     (approximate)   History   Vomiting  HPI  EM caveat, some limitation due to patient history of cerebral palsy cognitive  Derrick Atkins is a 52 y.o. male with a history of schizophrenia, acute kidney injury, cellulitis, thrombocytopenia, rhabdomyolysis, diabetes  Patient reports that he felt sick on his stomach, nauseated and has vomited several times.  When asked about having chest pain he reports he had discomfort earlier and points towards his epigastrium.  He does not seem to have any overt or obvious ongoing chest pain.  He reports he is not having chest pain now.  He does report his stomach is sore and painful.  Of note he is actively vomiting ascitic appearing yellowish stomach content  I called, left a HIPAA compliant message for Mrs. Candace Gobble guardian.  Requested return call back  When asked if he is staying well-hydrated and eating, he reports that he is only eating at 1 place every now and then.  He has not anything to eat or drink recently     Physical Exam   Triage Vital Signs: ED Triage Vitals  Encounter Vitals Group     BP 04/12/23 1252 (!) 146/112     Systolic BP Percentile --      Diastolic BP Percentile --      Pulse Rate 04/12/23 1252 80     Resp 04/12/23 1252 17     Temp 04/12/23 1252 98.6 F (37 C)     Temp Source 04/12/23 1252 Oral     SpO2 04/12/23 1252 100 %     Weight 04/12/23 1255 198 lb 10.2 oz (90.1 kg)     Height 04/12/23 1255 6\' 3"  (1.905 m)     Head Circumference --      Peak Flow --      Pain Score --      Pain Loc --      Pain Education --      Exclude from Growth Chart --     Most recent vital signs: Vitals:   04/12/23 1733 04/12/23 1800  BP:  (!) 144/66  Pulse:  78  Resp:  (!) 27  Temp:    SpO2: 93% 92%     General: Awake, no distress.  Peers somewhat ill, seated at the side of the  stretcher active vomiting of yellowish emesis CV:  Good peripheral perfusion.  Normal tones Resp:  Normal effort.  Clear bilateral Abd:  No distention.  Soft reports moderate tenderness throughout but no focal tenderness.  No obvious peritonitis in any quadrant Other:  Lower extremities notable for venous congestion, no cellulitic findings of the extremities.   ED Results / Procedures / Treatments   Labs (all labs ordered are listed, but only abnormal results are displayed) Labs Reviewed  BASIC METABOLIC PANEL - Abnormal; Notable for the following components:      Result Value   Chloride 96 (*)    Glucose, Bld 38 (*)    BUN 69 (*)    Creatinine, Ser 3.64 (*)    GFR, Estimated 19 (*)    All other components within normal limits  CBC - Abnormal; Notable for the following components:   WBC 3.7 (*)    Hemoglobin 12.2 (*)    HCT 36.9 (*)    Platelets 35 (*)    All other components within normal  limits  CBC WITH DIFFERENTIAL/PLATELET - Abnormal; Notable for the following components:   WBC 3.6 (*)    Hemoglobin 12.3 (*)    HCT 36.2 (*)    Platelets 34 (*)    All other components within normal limits  CBG MONITORING, ED - Abnormal; Notable for the following components:   Glucose-Capillary 106 (*)    All other components within normal limits  CBG MONITORING, ED - Abnormal; Notable for the following components:   Glucose-Capillary 33 (*)    All other components within normal limits  CBG MONITORING, ED - Abnormal; Notable for the following components:   Glucose-Capillary 151 (*)    All other components within normal limits  CK  PHOSPHORUS  MAGNESIUM  BASIC METABOLIC PANEL  CBC  IMMATURE PLATELET FRACTION  CBG MONITORING, ED  CBG MONITORING, ED  CBG MONITORING, ED  CBG MONITORING, ED  CBG MONITORING, ED  CBG MONITORING, ED  CBG MONITORING, ED  TROPONIN I (HIGH SENSITIVITY)  TROPONIN I (HIGH SENSITIVITY)     EKG  EKG interpreted by me at 1300 heart rate 80 QRS 90 QTc  460 Normal sinus rhythm, mild nonspecific T wave abnormality felt likely related to artifact.  No evidence of obvious frank ischemia no significantly prolonged QT   RADIOLOGY  Chest x-ray interpreted by me as negative for acute  CT ABDOMEN PELVIS WO CONTRAST  Result Date: 04/12/2023 CLINICAL DATA:  Nausea short of breath EXAM: CT ABDOMEN AND PELVIS WITHOUT CONTRAST TECHNIQUE: Multidetector CT imaging of the abdomen and pelvis was performed following the standard protocol without IV contrast. RADIATION DOSE REDUCTION: This exam was performed according to the departmental dose-optimization program which includes automated exposure control, adjustment of the mA and/or kV according to patient size and/or use of iterative reconstruction technique. COMPARISON:  CT 01/06/2023 FINDINGS: Lower chest: Lung bases demonstrate no acute airspace disease. Hepatobiliary: No focal liver abnormality is seen. No gallstones, gallbladder wall thickening, or biliary dilatation. Pancreas: Atrophic. No pancreatic ductal dilatation or surrounding inflammatory changes. Spleen: Normal in size without focal abnormality. Adrenals/Urinary Tract: Adrenal glands are within normal limits. Kidneys show no hydronephrosis. Punctate nonobstructing kidney stones. The bladder is slightly thick walled. Stomach/Bowel: The stomach is nonenlarged. 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 Large stool in the colon. No acute bowel wall thickening or obstructive features. Moderate rectal distension by feces Vascular/Lymphatic: Mild aortic atherosclerosis. No aneurysm. No suspicious lymph nodes. Reproductive: Prostate is unremarkable. Other: Negative for pelvic effusion or free air Musculoskeletal: No acute or suspicious abnormality. IMPRESSION: 1. Negative for hydronephrosis. Punctate nonobstructing  kidney stones. 2. No evidence for bowel obstruction or bowel wall thickening. 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 3. Slightly thick-walled bladder, correlate with urinalysis. 4. Aortic atherosclerosis. Aortic Atherosclerosis (ICD10-I70.0). Electronically Signed   By: Jasmine Pang M.D.   On: 04/12/2023 19:14   DG Chest 2 View  Result Date: 04/12/2023 CLINICAL DATA:  Chest pain. EXAM: CHEST - 2 VIEW COMPARISON:  04/11/2022. FINDINGS: Patchy areas of scarring/atelectasis noted at the right lung base. Bilateral lung fields are otherwise clear. No acute consolidation or lung collapse. Bilateral costophrenic angles are clear. Normal cardio-mediastinal silhouette. No acute osseous abnormalities. The soft tissues are within normal limits. IMPRESSION: No active cardiopulmonary disease. Electronically Signed   By: Rhea Belton  Ramiro Harvest M.D.   On: 04/12/2023 14:30      PROCEDURES:  Critical Care performed: Yes, see critical care procedure note(s)  CRITICAL CARE Performed by: Sharyn Creamer   Total critical care time: 30 minutes  Critical care time was exclusive of separately billable procedures and treating other patients.  Critical care was necessary to treat or prevent imminent or life-threatening deterioration.  Critical care was time spent personally by me on the following activities: development of treatment plan with patient and/or surrogate as well as nursing, discussions with consultants, evaluation of patient's response to treatment, examination of patient, obtaining history from patient or surrogate, ordering and performing treatments and interventions, ordering and review of laboratory studies, ordering and review of radiographic studies, pulse oximetry and re-evaluation of patient's condition.   Procedures   MEDICATIONS ORDERED IN ED: Medications  acetaminophen (TYLENOL) tablet 650 mg (has no  administration in time range)    Or  acetaminophen (TYLENOL) suppository 650 mg (has no administration in time range)  ondansetron (ZOFRAN) tablet 4 mg (has no administration in time range)    Or  ondansetron (ZOFRAN) injection 4 mg (has no administration in time range)  senna-docusate (Senokot-S) tablet 1 tablet (has no administration in time range)  dextrose 10 % infusion ( Intravenous New Bag/Given 04/12/23 2033)  rosuvastatin (CRESTOR) tablet 5 mg (has no administration in time range)  traZODone (DESYREL) tablet 100 mg (has no administration in time range)  levothyroxine (SYNTHROID) tablet 88 mcg (has no administration in time range)  divalproex (DEPAKOTE) DR tablet 750 mg (has no administration in time range)  ascorbic acid (VITAMIN C) tablet 500 mg (has no administration in time range)  cholecalciferol (VITAMIN D3) 25 MCG (1000 UNIT) tablet 1,000 Units (has no administration in time range)  benztropine (COGENTIN) tablet 1 mg (has no administration in time range)  OLANZapine (ZYPREXA) tablet 10 mg (has no administration in time range)  OLANZapine (ZYPREXA) tablet 20 mg (has no administration in time range)  ondansetron (ZOFRAN) injection 4 mg (4 mg Intravenous Given 04/12/23 1645)  sodium chloride 0.9 % bolus 1,000 mL (0 mLs Intravenous Stopped 04/12/23 1935)  morphine (PF) 4 MG/ML injection 4 mg (4 mg Intravenous Given 04/12/23 1644)  dextrose 50 % solution 50 mL (50 mLs Intravenous Given 04/12/23 1934)     IMPRESSION / MDM / ASSESSMENT AND PLAN / ED COURSE  I reviewed the triage vital signs and the nursing notes.                              Differential diagnosis includes, but is not limited to, dehydration, acute intra-abdominal process, infectious causation, etc.  He is chest pain that he describes does not seem to represent obvious chest pain.  ECG reassuring and his troponin is normal.  He does describe however abdominal discomfort active vomiting.  His labs demonstrated  leukopenia, thrombocytopenia which appears quite severe  Patient's presentation is most consistent with acute presentation with potential threat to life or bodily function.   The patient is on the cardiac monitor to evaluate for evidence of arrhythmia and/or significant heart rate changes.  Clinical Course as of 04/12/23 2034  Tue Apr 12, 2023  1904 Lenn Sink affirms guardian and advises ok for admission.  [MQ]    Clinical Course User Index [MQ] Sharyn Creamer, MD   ----------------------------------------- 7:28 PM on 04/12/2023 ----------------------------------------- Glucose has fallen once again, now at 33.  Will give 1 amp D50.  Patient is resting without obvious distress at this time.  Recurrent hypoglycemia.  Patient is noted to have diet-controlled type 2 diabetes.  At this juncture given his recurrent episodes of hypoglycemia, severe thrombocytopenia, I am concerned about other acute underlying process.  I have ordered peripheral smear though the patient does have a history of thrombocytopenia in the past is noted to be chronic per previous documentation.  Is no obvious evidence of acute infection or cellulitic findings.  Patient's legs back arms, no clear evidence of acute infection.  He is awake and alert his nausea and vomiting have improved.  Will trial orange juice, also gave 1 amp of D50  Patient will require further evaluation and admission to the hospital, multiple concerns   Discussed case clinical history pending tests including peripheral smear and differential with Dr. Sedalia Muta.  Dr. Sedalia Muta excepting to the hospitalist service for further care and treatment  FINAL CLINICAL IMPRESSION(S) / ED DIAGNOSES   Final diagnoses:  Nausea and vomiting, unspecified vomiting type  Hypoglycemia  Thrombocytopenia (HCC)  AKI (acute kidney injury) (HCC)     Rx / DC Orders   ED Discharge Orders     None        Note:  This document was prepared using Dragon voice  recognition software and may include unintentional dictation errors.   Sharyn Creamer, MD 04/12/23 2034

## 2023-04-12 NOTE — Assessment & Plan Note (Addendum)
Suspect secondary to prerenal in setting of nausea and vomiting and poor p.o. intake Will check BMP in a.m. Status post sodium chloride 1 L bolus per EDP Continue to D10 infusion, 1 day ordered

## 2023-04-12 NOTE — Assessment & Plan Note (Signed)
-   Levothyroxine 88 mcg daily resumed 

## 2023-04-12 NOTE — ED Notes (Signed)
Patient in [place]. AAOx3.  Skin warm and dry.  Moved to triage area and orange juice, graham crackers and peanut butter given.  Patient states he has not eaten since early this morning.  Will continue to monitor.

## 2023-04-12 NOTE — Assessment & Plan Note (Signed)
Trace bilateral lower extremity swelling Check LFTs

## 2023-04-12 NOTE — Assessment & Plan Note (Signed)
Pending complete med reconciliation On review does not appear to be on an antihypertensive medication Hydralazine 5 mg IV every 6 hours as needed for SBP greater 165, 4 days ordered

## 2023-04-12 NOTE — Assessment & Plan Note (Addendum)
Baseline platelet since August 2024 has been 58-105 Immature platelet fraction has been ordered to verify platelet count Check CBC in the a.m.

## 2023-04-12 NOTE — Assessment & Plan Note (Addendum)
A1c on 12/13/2022 was 4.3.  No insulin SSI with at bedtime coverage ordered

## 2023-04-12 NOTE — Assessment & Plan Note (Signed)
With history of schizoaffective disorder Home Depakote 750 mg twice daily, olanzapine 30 mg nightly resumed Benztropine 1 mg daily resumed

## 2023-04-12 NOTE — ED Notes (Signed)
Lunch tray given to patient.  Patient remains AAOx3.  Skin warm and dry.  Warm blankets given.  Patient eating lunch tray and drinking orange juice.

## 2023-04-12 NOTE — Hospital Course (Signed)
Mr. Derrick Atkins is a 52 year old male with history of cerebral palsy, depression, non-insulin-dependent diabetes mellitus, hypertension, schizoaffective disorder, scoliosis, who presents emergency department for chief concerns of nausea, vomiting, shortness of breath.  Vitals in the ED showed temperature of 98.2, respiration rate of 17, heart rate of 81, blood pressure 163/85, SpO2 95% on room air.  Serum sodium is 141, potassium 5.0, chloride 96, bicarb 30, BUN of 69, serum creatinine of 3.64, EGFR of 19, nonfasting blood glucose 38, WBC 3.7, hemoglobin 12.2, platelets of 35.  High sensitive troponin was 10 and on repeat was 6.  CK was 98.  CT abdomen pelvis wo contrast: Was read as negative for hydronephrosis.  Punctate nonobstructing kidney stones.  No evidence of bowel obstruction or bowel wall thickening.  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  Aortic atherosclerosis.  ED treatment: D5 1 amp one-time dose, morphine 4 mg IV one-time dose, ondansetron 4 mg IV, sodium chloride 1 L bolus.

## 2023-04-12 NOTE — ED Triage Notes (Signed)
Pt here from [facility]. Pt also had nausea and SOB. Pt does not have a cardiac hx. Pt received 324 mg of aspirin, pt states cp resolved and is now c/o throat pain.   140/96 78 98% RA

## 2023-04-12 NOTE — Assessment & Plan Note (Signed)
-   Rosuvastatin 5 mg daily resumed 

## 2023-04-12 NOTE — ED Notes (Signed)
Pt here today and reports N/V, states his chest pain went away. Vomited 2x at bedside while this RN was placing IV.

## 2023-04-12 NOTE — Assessment & Plan Note (Addendum)
D10 infusion, 1 day ordered CBG monitoring every 2 hours, 3 times check ordered on admission Admit to stepdown, inpatient

## 2023-04-12 NOTE — H&P (Addendum)
History and Physical   Derrick Atkins FAO:[id number] DOB: 1971-02-08 DOA: 04/12/2023  PCP: Derrick Bound, NP  Patient coming from: Group home  I have personally briefly reviewed patient's old medical records in [facility] EMR.  Chief Concern: Nausea, vomiting, shortness of breath  HPI: Mr. Derrick Atkins is a 52 year old male with history of cerebral palsy, depression, non-insulin-dependent diabetes mellitus, hypertension, schizoaffective disorder, scoliosis, who presents emergency department for chief concerns of nausea, vomiting, shortness of breath.  Vitals in the ED showed temperature of 98.2, respiration rate of 17, heart rate of 81, blood pressure 163/85, SpO2 95% on room air.  Serum sodium is 141, potassium 5.0, chloride 96, bicarb 30, BUN of 69, serum creatinine of 3.64, EGFR of 19, nonfasting blood glucose 38, WBC 3.7, hemoglobin 12.2, platelets of 35.  High sensitive troponin was 10 and on repeat was 6.  CK was 98.  CT abdomen pelvis wo contrast: Was read as negative for hydronephrosis.  Punctate nonobstructing kidney stones.  No evidence of bowel obstruction or bowel wall thickening.  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  Aortic atherosclerosis.  ED treatment: D5 1 amp one-time dose, morphine 4 mg IV one-time dose, ondansetron 4 mg IV, sodium chloride 1 L bolus. ------------------------------- At bedside, patient patient was able to weakly tell me his first and last name, age, location, current calendar year of 46.  Reports that he lives in a group home because he cannot take care of herself.  When prompted to explain further, he just closes eyes to go back to sleep.  He states that normally he can walk however is not able to explain to me why he feels so weak right now.  He does attempt to lift up his hips and does move his toes and ankles, though attempts were  weak.  Social history: He lives in a group home.  He denies tobacco, EtOH, recreational drug use.  He is disabled and does not work.  ROS: Unable to fully complete due to patient acuity  ED Course: Discussed with emergency medicine provider, patient requiring hospitalization for chief concerns of hypoglycemia.  Assessment/Plan  Principal Problem:   Hypoglycemia Active Problems:   Altered mental status   AKI (acute kidney injury) (HCC)   Type 2 diabetes mellitus without complications (HCC)   Essential hypertension   Thrombocytopenia (HCC)   Hypothyroidism   Dyslipidemia   Schizophrenia, paranoid, chronic (HCC)   Schizophrenia (HCC)   DM2 (diabetes mellitus, type 2) (HCC)   Hyperlipidemia   Leg swelling   Assessment and Plan:  * Hypoglycemia D10 infusion, 1 day ordered CBG monitoring every 2 hours, 3 times check ordered on admission Admit to stepdown, inpatient  Altered mental status Etiology workup in progress, initially suspect secondary to hypoglycemia however patient last CBG showed glucose of 151 And patient appears lethargic at bedside, I do not know patient's baseline mentation Patient states that he can walk normally however appears weak in bilateral extremities CT head without contrast ordered Check LFTs, procalcitonin, ammonia level B12 level was considered however patient recently had a B12 check at 12/15/2022 which resulted at: 1208  AKI (acute kidney injury) (HCC) Suspect secondary to prerenal in setting of nausea and vomiting and poor p.o. intake Will check BMP in a.m. Status post sodium chloride 1 L bolus per EDP Continue to D10 infusion, 1 day ordered  Essential hypertension Pending complete med reconciliation On review does not  appear to be on an antihypertensive medication Hydralazine 5 mg IV every 6 hours as needed for SBP greater 165, 4 days ordered  Thrombocytopenia (HCC) Baseline platelet since August 2024 has been 58-105 Immature platelet  fraction has been ordered to verify platelet count Check CBC in the a.m.  Hypothyroidism Levothyroxine 88 mcg daily resumed  Dyslipidemia Rosuvastatin 5 mg daily resumed  Schizophrenia, paranoid, chronic (HCC) With history of schizoaffective disorder Home Depakote 750 mg twice daily, olanzapine 30 mg nightly resumed Benztropine 1 mg daily resumed  Leg swelling Trace bilateral lower extremity swelling Check LFTs  DM2 (diabetes mellitus, type 2) (HCC) A1c on 12/13/2022 was 4.3.  No insulin SSI with at bedtime coverage ordered  Chart reviewed.   DVT prophylaxis: TED hose; pending CT head Code Status: Full code Diet: Regular diet Family Communication: No, patient is a ward of the state Disposition Plan: Pending clinical course Consults called: None at this time Admission status: Stepdown, inpatient  Past Medical History:  Diagnosis Date   Cerebral palsy (HCC)    Depression    Diabetes mellitus without complication (HCC)    Hypertension    Kidney stones    Schizoaffective disorder (HCC)    Scoliosis    Past Surgical History:  Procedure Laterality Date   BOWEL RESECTION     CATARACT EXTRACTION W/PHACO Right 09/15/2022   Procedure: CATARACT EXTRACTION PHACO AND INTRAOCULAR LENS PLACEMENT (IOC) RIGHT DIABETIC VISION BLUE HEALON 5  15.79  01:31.9;  Surgeon: Lockie Mola, MD;  Location: [facility];  Service: Ophthalmology;  Laterality: Right;  Latex Diabetic   INCISION AND DRAINAGE ABSCESS Left 12/20/2022   Procedure: INCISION AND DRAINAGE ABSCESS;  Surgeon: Campbell Lerner, MD;  Location: [facility];  Service: General;  Laterality: Left;   LAPAROTOMY N/A 05/15/2020   Procedure: EXPLORATORY LAPAROTOMY;  Surgeon: Henrene Dodge, MD;  Location: [facility];  Service: General;  Laterality: N/A;   Social History:  reports that he has never smoked. He has never been exposed to tobacco smoke. He has never used smokeless tobacco. He reports that he does not drink alcohol and  does not use drugs.  Allergies  Allergen Reactions   Phenytoin Sodium Extended Other (See Comments) and Nausea And Vomiting   Prednisone Hives and Nausea And Vomiting   Latex Hives, Nausea And Vomiting and Rash   Family History  Problem Relation Age of Onset   Heart disease Father    Heart attack Father    Family history: Family history reviewed and not pertinent.  Prior to Admission medications   Medication Sig Start Date End Date Taking? Authorizing Provider  ABILIFY MAINTENA 400 MG PRSY prefilled syringe Inject 400 mg into the muscle every 28 (twenty-eight) days.    [provider]  acetaminophen (TYLENOL) 500 MG tablet Take 1,000 mg by mouth 3 (three) times daily as needed for mild pain or moderate pain.    [provider]  ascorbic acid (VITAMIN C) 500 MG tablet Take 1 tablet (500 mg total) by mouth daily. 12/24/22   Marcelino Duster, MD  aspirin EC 81 MG tablet Take 1 tablet (81 mg total) by mouth daily. Swallow whole. 04/09/22   [name], [name], MD  benztropine (COGENTIN) 1 MG tablet Take 1 tablet (1 mg total) by mouth daily. Patient taking differently: Take 1 mg by mouth 2 (two) times daily. 04/08/22   [name], [name], MD  busPIRone (BUSPAR) 15 MG tablet Take 15 mg by mouth 3 (three) times daily. 12/11/22   [provider]  cholecalciferol (VITAMIN D3) 25 MCG (1000 UNIT) tablet Take 1,000 Units by mouth daily.    [provider]  divalproex (DEPAKOTE) 250 MG DR tablet Take 3 tablets (750 mg total) by mouth every 12 (twelve) hours. 04/08/22   [name], [name], MD  fenofibrate 54 MG tablet Take 54 mg by mouth daily.    [provider]  folic acid (FOLVITE) 1 MG tablet Take 1 tablet (1 mg total) by mouth daily. 12/24/22   Marcelino Duster, MD  furosemide (LASIX) 20 MG tablet Take 20 mg by mouth in the morning.    [provider]  gemfibrozil (LOPID) 600 MG tablet Take 600 mg by mouth 2 (two) times daily before a meal.     [provider]  iron polysaccharides (NIFEREX) 150 MG capsule Take 1 capsule (150 mg total) by mouth daily. 12/24/22   Marcelino Duster, MD  levothyroxine (SYNTHROID) 88 MCG tablet Take 1 tablet (88 mcg total) by mouth daily at 6 (six) AM. 04/09/22   [name], [name], MD  OLANZapine (ZYPREXA) 10 MG tablet Take 10 mg by mouth at bedtime. (Take with 20mg  tablet to equal 30mg  total)    [provider]  OLANZapine (ZYPREXA) 20 MG tablet Take 1 tablet (20 mg total) by mouth at bedtime. Patient taking differently: Take 20 mg by mouth at bedtime. (Take with 10mg  tablet to equal 30mg  total) 04/08/22   [name], [name], MD  polyethylene glycol (MIRALAX / GLYCOLAX) 17 g packet Take 17 g by mouth daily.    [provider]  potassium chloride (KLOR-CON) 20 MEQ packet Take 20 mEq by mouth daily. 02/26/23 03/28/23  Core, Doy Hutching, MD  rosuvastatin (CRESTOR) 5 MG tablet Take 5 mg by mouth daily.    [provider]  senna (SENOKOT) 8.6 MG TABS tablet Take 2 tablets by mouth at bedtime.    [provider]  traZODone (DESYREL) 100 MG tablet Take 100 mg by mouth at bedtime.    [provider]   Physical Exam: Vitals:   04/12/23 1731 04/12/23 1732 04/12/23 1733 04/12/23 1800  BP:  (!) 150/76  (!) 144/66  Pulse:  (!) 36  78  Resp:  (!) 26  (!) 27  Temp:      TempSrc:      SpO2: 93%  93% 92%  Weight:      Height:       Constitutional: appears age-appropriate, frail, chronically ill, acutely weak and lethargic Eyes: PERRL, lids and conjunctivae normal ENMT: Mucous membranes are dry. Posterior pharynx clear of any exudate or lesions. Age-appropriate dentition. Hearing appropriate Neck: normal, supple, no masses, no thyromegaly Respiratory: clear to auscultation bilaterally, no wheezing, no crackles. Normal respiratory effort. No accessory muscle use.  Cardiovascular: Regular rate and rhythm, no murmurs / rubs / gallops.  Vital lateral lower extremity edema.  2+ pedal pulses. No carotid bruits.  Abdomen: no tenderness, no masses palpated, no hepatosplenomegaly. Bowel sounds positive.  Musculoskeletal: no clubbing / cyanosis. No joint deformity upper and lower extremities.  General decrease ROM, no contractures, no atrophy.  Decreased muscle tone.  General weakness Skin: no rashes, lesions, ulcers. No induration Neurologic: Sensation intact.  No decrease in strength, no focal deficits Psychiatric: Unable to assess judgment and insight. Alert and oriented x 3.  Depressed mood.  Flat affect  EKG: independently reviewed, showing sinus rhythm with rate of 80, QTc 465  Chest x-ray on Admission: I personally reviewed and I agree with radiologist  reading as below.  CT ABDOMEN PELVIS WO CONTRAST  Result Date: 04/12/2023 CLINICAL DATA:  Nausea short of breath EXAM: CT ABDOMEN AND PELVIS WITHOUT CONTRAST TECHNIQUE: Multidetector CT imaging of the abdomen and pelvis was performed following the standard protocol without IV contrast. RADIATION DOSE REDUCTION: This exam was performed according to the departmental dose-optimization program which includes automated exposure control, adjustment of the mA and/or kV according to patient size and/or use of iterative reconstruction technique. COMPARISON:  CT 01/06/2023 FINDINGS: Lower chest: Lung bases demonstrate no acute airspace disease. Hepatobiliary: No focal liver abnormality is seen. No gallstones, gallbladder wall thickening, or biliary dilatation. Pancreas: Atrophic. No pancreatic ductal dilatation or surrounding inflammatory changes. Spleen: Normal in size without focal abnormality. Adrenals/Urinary Tract: Adrenal glands are within normal limits. Kidneys show no hydronephrosis. Punctate nonobstructing kidney stones. The bladder is slightly thick walled. Stomach/Bowel: The stomach is nonenlarged. 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 Large stool in the colon. No acute bowel wall thickening or obstructive features. Moderate rectal distension by feces Vascular/Lymphatic: Mild aortic atherosclerosis. No aneurysm. No suspicious lymph nodes. Reproductive: Prostate is unremarkable. Other: Negative for pelvic effusion or free air Musculoskeletal: No acute or suspicious abnormality. IMPRESSION: 1. Negative for hydronephrosis. Punctate nonobstructing kidney stones. 2. No evidence for bowel obstruction or bowel wall thickening. 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 3. Slightly thick-walled bladder, correlate with urinalysis. 4. Aortic atherosclerosis. Aortic Atherosclerosis (ICD10-I70.0). Electronically Signed   By: Jasmine Pang M.D.   On: 04/12/2023 19:14   DG Chest 2 View  Result Date: 04/12/2023 CLINICAL DATA:  Chest pain. EXAM: CHEST - 2 VIEW COMPARISON:  04/11/2022. FINDINGS: Patchy areas of scarring/atelectasis noted at the right lung base. Bilateral lung fields are otherwise clear. No acute consolidation or lung collapse. Bilateral costophrenic angles are clear. Normal cardio-mediastinal silhouette. No acute osseous abnormalities. The soft tissues are within normal limits. IMPRESSION: No active cardiopulmonary disease. Electronically Signed   By: Jules Schick M.D.   On: 04/12/2023 14:30    Labs on Admission: I have personally reviewed following labs  CBC: Recent Labs  Lab 04/12/23 1351  WBC 3.6*  3.7*  NEUTROABS 1.8  HGB 12.3*  12.2*  HCT 36.2*  36.9*  MCV 83.0  84.1  PLT 34*  35*   Basic Metabolic Panel: Recent Labs  Lab 04/12/23 1351 04/12/23 2027  NA 141  --   K 5.0  --   CL 96*  --   CO2 30  --   GLUCOSE 38*  --   BUN 69*  --   CREATININE 3.64*  --   CALCIUM 9.8  --   MG  --  2.8*  PHOS  --  5.0*   GFR: Estimated Creatinine Clearance:  29 mL/min (A) (by C-G formula based on SCr of 3.64 mg/dL (H)).  Cardiac Enzymes: Recent Labs  Lab 04/12/23 1351  CKTOTAL 98   CBG: Recent Labs  Lab 04/12/23 1516 04/12/23 1647 04/12/23 1816 04/12/23 1926 04/12/23 1954  GLUCAP 106* 96 70 33* 151*   Urine analysis:    Component Value Date/Time   COLORURINE YELLOW (A) 02/21/2023 1551   APPEARANCEUR CLOUDY (A) 02/21/2023 1551   APPEARANCEUR Clear 12/14/2012 0619   LABSPEC 1.012 02/21/2023 1551   LABSPEC 1.030 12/14/2012 4098  PHURINE 5.0 02/21/2023 1551   GLUCOSEU NEGATIVE 02/21/2023 1551   GLUCOSEU 50 mg/dL 16/04/9603 5409   HGBUR NEGATIVE 02/21/2023 1551   BILIRUBINUR NEGATIVE 02/21/2023 1551   BILIRUBINUR Negative 12/14/2012 0619   KETONESUR NEGATIVE 02/21/2023 1551   PROTEINUR NEGATIVE 02/21/2023 1551   NITRITE NEGATIVE 02/21/2023 1551   LEUKOCYTESUR NEGATIVE 02/21/2023 1551   LEUKOCYTESUR Negative 12/14/2012 0619   CRITICAL CARE Performed by: Dr. Sedalia Muta  Total critical care time: 32 minutes  Critical care time was exclusive of separately billable procedures and treating other patients.  Critical care was necessary to treat or prevent imminent or life-threatening deterioration.  Critical care was time spent personally by me on the following activities: development of treatment plan with patient and/or surrogate as well as nursing, discussions with consultants, evaluation of patient's response to treatment, examination of patient, obtaining history from patient or surrogate, ordering and performing treatments and interventions, ordering and review of laboratory studies, ordering and review of radiographic studies, pulse oximetry and re-evaluation of patient's condition.  This document was prepared using Dragon Voice Recognition software and may include unintentional dictation errors.  Dr. Sedalia Muta [facility]  If 7PM-7AM, please contact overnight-coverage provider If 7AM-7PM, please contact day attending  provider [email or web address]  04/12/2023, 9:07 PM

## 2023-04-12 NOTE — Assessment & Plan Note (Signed)
Etiology workup in progress, initially suspect secondary to hypoglycemia however patient last CBG showed glucose of 151 And patient appears lethargic at bedside, I do not know patient's baseline mentation Patient states that he can walk normally however appears weak in bilateral extremities CT head without contrast ordered Check LFTs, procalcitonin, ammonia level B12 level was considered however patient recently had a B12 check at 12/15/2022 which resulted at: 1208

## 2023-04-12 NOTE — ED Notes (Addendum)
ED TO INPATIENT HANDOFF REPORT  ED Nurse Name and Phone #: Horatio Pel, RN   S Name/Age/Gender Derrick Atkins 52 y.o. male Room/Bed: ED19A/ED19A  Code Status   Code Status: Full Code  Home/SNF/Other Nursing Home Patient oriented to: self, place, time, and situation Is this baseline? Yes   Triage Complete: Triage complete  Chief Complaint Hypoglycemia [E16.2]  Triage Note Pt here from [facility]. Pt also had nausea and SOB. Pt does not have a cardiac hx. Pt received 324 mg of aspirin, pt states cp resolved and is now c/o throat pain.   140/96 78 98% RA    Allergies Allergies  Allergen Reactions   Phenytoin Sodium Extended Other (See Comments) and Nausea And Vomiting   Prednisone Hives and Nausea And Vomiting   Latex Hives, Nausea And Vomiting and Rash    Level of Care/Admitting Diagnosis ED Disposition     ED Disposition  Admit   Condition  --   Comment  Hospital Area: [facility] [[id number]]  Level of Care: Stepdown [14]  Covid Evaluation: Asymptomatic - no recent exposure (last 10 days) testing not required  Diagnosis: Hypoglycemia [242204]  Admitting Physician: Lovenia Kim [[id number]]  Attending Physician: COX, AMY N [id number]  Certification:: I certify this patient will need inpatient services for at least 2 midnights  Expected Medical Readiness: 04/15/2023          B Medical/Surgery History Past Medical History:  Diagnosis Date   Cerebral palsy (HCC)    Depression    Diabetes mellitus without complication (HCC)    Hypertension    Kidney stones    Schizoaffective disorder (HCC)    Scoliosis    Past Surgical History:  Procedure Laterality Date   BOWEL RESECTION     CATARACT EXTRACTION W/PHACO Right 09/15/2022   Procedure: CATARACT EXTRACTION PHACO AND INTRAOCULAR LENS PLACEMENT (IOC) RIGHT DIABETIC VISION BLUE HEALON 5  15.79  01:31.9;  Surgeon: Lockie Mola, MD;  Location: [facility];  Service:  Ophthalmology;  Laterality: Right;  Latex Diabetic   INCISION AND DRAINAGE ABSCESS Left 12/20/2022   Procedure: INCISION AND DRAINAGE ABSCESS;  Surgeon: Campbell Lerner, MD;  Location: [facility];  Service: General;  Laterality: Left;   LAPAROTOMY N/A 05/15/2020   Procedure: EXPLORATORY LAPAROTOMY;  Surgeon: Henrene Dodge, MD;  Location: [facility];  Service: General;  Laterality: N/A;     A IV Location/Drains/Wounds Patient Lines/Drains/Airways Status     Active Line/Drains/Airways     Name Placement date Placement time Site Days   Peripheral IV 04/12/23 Posterior;Right Hand 04/12/23  1644  Hand  less than 1   Wound / Incision (Open or Dehisced) 12/13/22 Non-pressure wound Leg Anterior;Left;Distal I&D in ED for abcess 12/13/22  1025  Leg  120   Wound / Incision (Open or Dehisced) 01/06/23 Pretibial Left;Proximal 01/06/23  1500  Pretibial  96   Wound / Incision (Open or Dehisced) 02/21/23 Pretibial Left Left Leg Wounds 02/21/23  2230  Pretibial  50            Intake/Output Last 24 hours  Intake/Output Summary (Last 24 hours) at 04/12/2023 1958 Last data filed at 04/12/2023 1935 Gross per 24 hour  Intake 1000 ml  Output --  Net 1000 ml    Labs/Imaging Results for orders placed or performed during the hospital encounter of 04/12/23 (from the past 48 hour(s))  Basic metabolic panel     Status: Abnormal   Collection Time: 04/12/23  1:51 PM  Result Value Ref Range   Sodium 141 135 - 145 mmol/L   Potassium 5.0 3.5 - 5.1 mmol/L   Chloride 96 (L) 98 - 111 mmol/L   CO2 30 22 - 32 mmol/L   Glucose, Bld 38 (LL) 70 - 99 mg/dL    Comment: CRITICAL RESULT CALLED TO, READ BACK BY AND VERIFIED WITH JANE RYAN 1435 04/12/23 MU Glucose reference range applies only to samples taken after fasting for at least 8 hours.    BUN 69 (H) 6 - 20 mg/dL   Creatinine, Ser 6.04 (H) 0.61 - 1.24 mg/dL   Calcium 9.8 8.9 - 54.0 mg/dL   GFR, Estimated 19 (L) >60 mL/min    Comment: (NOTE) Calculated using  the CKD-EPI Creatinine Equation (2021)    Anion gap 15 5 - 15    Comment: Performed at [facility], [address] [place], [place] [address]  CBC     Status: Abnormal   Collection Time: 04/12/23  1:51 PM  Result Value Ref Range   WBC 3.7 (L) 4.0 - 10.5 K/uL   RBC 4.39 4.22 - 5.81 MIL/uL   Hemoglobin 12.2 (L) 13.0 - 17.0 g/dL   HCT 14.7 (L) 82.9 - 56.2 %   MCV 84.1 80.0 - 100.0 fL   MCH 27.8 26.0 - 34.0 pg   MCHC 33.1 30.0 - 36.0 g/dL   RDW 13.0 86.5 - 78.4 %   Platelets 35 (L) 150 - 400 K/uL    Comment: SPECIMEN CHECKED FOR CLOTS Immature Platelet Fraction may be clinically indicated, consider ordering this additional test [id number] REPEATED TO VERIFY PLATELET COUNT CONFIRMED BY SMEAR    nRBC 0.0 0.0 - 0.2 %    Comment: Performed at [facility], [address] [place], [place] [address]  Troponin I (High Sensitivity)     Status: None   Collection Time: 04/12/23  1:51 PM  Result Value Ref Range   Troponin I (High Sensitivity) 10 <18 ng/L    Comment: (NOTE) Elevated high sensitivity troponin I (hsTnI) values and significant  changes across serial measurements may suggest ACS but many other  chronic and acute conditions are known to elevate hsTnI results.  Refer to the "Links" section for chest pain algorithms and additional  guidance. Performed at [facility], [address] [place], [place] [address]   CK     Status: None   Collection Time: 04/12/23  1:51 PM  Result Value Ref Range   Total CK 98 49 - 397 U/L    Comment: Performed at [facility], [address] [place], [place] [address]  CBC with Differential/Platelet     Status: Abnormal   Collection Time: 04/12/23  1:51 PM  Result Value Ref Range   WBC 3.6 (L) 4.0 - 10.5 K/uL   RBC 4.36 4.22 - 5.81 MIL/uL   Hemoglobin 12.3 (L) 13.0 - 17.0 g/dL   HCT 64.4 (L) 03.4 - 74.2 %   MCV 83.0 80.0 - 100.0 fL   MCH 28.2 26.0 - 34.0 pg   MCHC 34.0 30.0 - 36.0 g/dL   RDW 59.5  63.8 - 75.6 %   Platelets 34 (L) 150 - 400 K/uL    Comment: Immature Platelet Fraction may be clinically indicated, consider ordering this additional test [id number] PLATELET COUNT CONFIRMED BY SMEAR    nRBC 0.0 0.0 - 0.2 %   Neutrophils Relative % 49 %   Neutro Abs 1.8 1.7 - 7.7 K/uL   Lymphocytes Relative 40 %  Lymphs Abs 1.4 0.7 - 4.0 K/uL   Monocytes Relative 8 %   Monocytes Absolute 0.3 0.1 - 1.0 K/uL   Eosinophils Relative 1 %   Eosinophils Absolute 0.1 0.0 - 0.5 K/uL   Basophils Relative 1 %   Basophils Absolute 0.0 0.0 - 0.1 K/uL   WBC Morphology MORPHOLOGY UNREMARKABLE    RBC Morphology MIXED RBC MORPHOLOGY    Smear Review Normal platelet morphology    Immature Granulocytes 1 %   Abs Immature Granulocytes 0.02 0.00 - 0.07 K/uL    Comment: Performed at [facility], [address] [place], [place] [address]  CBG monitoring, ED     Status: Abnormal   Collection Time: 04/12/23  3:16 PM  Result Value Ref Range   Glucose-Capillary 106 (H) 70 - 99 mg/dL    Comment: Glucose reference range applies only to samples taken after fasting for at least 8 hours.  POC CBG, ED     Status: None   Collection Time: 04/12/23  4:47 PM  Result Value Ref Range   Glucose-Capillary 96 70 - 99 mg/dL    Comment: Glucose reference range applies only to samples taken after fasting for at least 8 hours.  Troponin I (High Sensitivity)     Status: None   Collection Time: 04/12/23  4:49 PM  Result Value Ref Range   Troponin I (High Sensitivity) 6 <18 ng/L    Comment: (NOTE) Elevated high sensitivity troponin I (hsTnI) values and significant  changes across serial measurements may suggest ACS but many other  chronic and acute conditions are known to elevate hsTnI results.  Refer to the "Links" section for chest pain algorithms and additional  guidance. Performed at [facility], [address] [place], [place] [address]   POC CBG, ED     Status: None   Collection Time:  04/12/23  6:16 PM  Result Value Ref Range   Glucose-Capillary 70 70 - 99 mg/dL    Comment: Glucose reference range applies only to samples taken after fasting for at least 8 hours.  POC CBG, ED     Status: Abnormal   Collection Time: 04/12/23  7:26 PM  Result Value Ref Range   Glucose-Capillary 33 (LL) 70 - 99 mg/dL    Comment: Glucose reference range applies only to samples taken after fasting for at least 8 hours.   CT ABDOMEN PELVIS WO CONTRAST  Result Date: 04/12/2023 CLINICAL DATA:  Nausea short of breath EXAM: CT ABDOMEN AND PELVIS WITHOUT CONTRAST TECHNIQUE: Multidetector CT imaging of the abdomen and pelvis was performed following the standard protocol without IV contrast. RADIATION DOSE REDUCTION: This exam was performed according to the departmental dose-optimization program which includes automated exposure control, adjustment of the mA and/or kV according to patient size and/or use of iterative reconstruction technique. COMPARISON:  CT 01/06/2023 FINDINGS: Lower chest: Lung bases demonstrate no acute airspace disease. Hepatobiliary: No focal liver abnormality is seen. No gallstones, gallbladder wall thickening, or biliary dilatation. Pancreas: Atrophic. No pancreatic ductal dilatation or surrounding inflammatory changes. Spleen: Normal in size without focal abnormality. Adrenals/Urinary Tract: Adrenal glands are within normal limits. Kidneys show no hydronephrosis. Punctate nonobstructing kidney stones. The bladder is slightly thick walled. Stomach/Bowel: The stomach is nonenlarged. 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 Large stool in the colon. No acute bowel wall thickening or obstructive  features. Moderate rectal distension by feces Vascular/Lymphatic: Mild aortic atherosclerosis. No aneurysm. No suspicious lymph nodes. Reproductive:  Prostate is unremarkable. Other: Negative for pelvic effusion or free air Musculoskeletal: No acute or suspicious abnormality. IMPRESSION: 1. Negative for hydronephrosis. Punctate nonobstructing kidney stones. 2. No evidence for bowel obstruction or bowel wall thickening. 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 3. Slightly thick-walled bladder, correlate with urinalysis. 4. Aortic atherosclerosis. Aortic Atherosclerosis (ICD10-I70.0). Electronically Signed   By: Jasmine Pang M.D.   On: 04/12/2023 19:14   DG Chest 2 View  Result Date: 04/12/2023 CLINICAL DATA:  Chest pain. EXAM: CHEST - 2 VIEW COMPARISON:  04/11/2022. FINDINGS: Patchy areas of scarring/atelectasis noted at the right lung base. Bilateral lung fields are otherwise clear. No acute consolidation or lung collapse. Bilateral costophrenic angles are clear. Normal cardio-mediastinal silhouette. No acute osseous abnormalities. The soft tissues are within normal limits. IMPRESSION: No active cardiopulmonary disease. Electronically Signed   By: Jules Schick M.D.   On: 04/12/2023 14:30    Pending Labs Unresulted Labs (From admission, onward)     Start     Ordered   04/13/23 0500  Basic metabolic panel  Tomorrow morning,   R        04/12/23 1950   04/13/23 0500  CBC  Tomorrow morning,   R        04/12/23 1950   04/12/23 1950  Phosphorus  ONCE - STAT,   STAT        04/12/23 1950   04/12/23 1950  Magnesium  ONCE - STAT,   STAT        04/12/23 1950            Vitals/Pain Today's Vitals   04/12/23 1731 04/12/23 1732 04/12/23 1733 04/12/23 1800  BP:  (!) 150/76  (!) 144/66  Pulse:  (!) 36  78  Resp:  (!) 26  (!) 27  Temp:      TempSrc:      SpO2: 93%  93% 92%  Weight:      Height:      PainSc:        Isolation Precautions No active isolations  Medications Medications  acetaminophen (TYLENOL) tablet 650 mg (has no  administration in time range)    Or  acetaminophen (TYLENOL) suppository 650 mg (has no administration in time range)  ondansetron (ZOFRAN) tablet 4 mg (has no administration in time range)    Or  ondansetron (ZOFRAN) injection 4 mg (has no administration in time range)  senna-docusate (Senokot-S) tablet 1 tablet (has no administration in time range)  dextrose 10 % infusion (has no administration in time range)  rosuvastatin (CRESTOR) tablet 5 mg (has no administration in time range)  traZODone (DESYREL) tablet 100 mg (has no administration in time range)  levothyroxine (SYNTHROID) tablet 88 mcg (has no administration in time range)  divalproex (DEPAKOTE) DR tablet 750 mg (has no administration in time range)  ascorbic acid (VITAMIN C) tablet 500 mg (has no administration in time range)  cholecalciferol (VITAMIN D3) 25 MCG (1000 UNIT) tablet 1,000 Units (has no administration in time range)  benztropine (COGENTIN) tablet 1 mg (has no administration in time range)  OLANZapine (ZYPREXA) tablet 10 mg (has no administration in time range)  OLANZapine (ZYPREXA) tablet 20 mg (has no administration in time range)  ondansetron (ZOFRAN) injection 4 mg (4 mg Intravenous Given 04/12/23 1645)  sodium chloride 0.9 % bolus  1,000 mL (0 mLs Intravenous Stopped 04/12/23 1935)  morphine (PF) 4 MG/ML injection 4 mg (4 mg Intravenous Given 04/12/23 1644)  dextrose 50 % solution 50 mL (50 mLs Intravenous Given 04/12/23 1934)    Mobility walks with person assist     Focused Assessments Neuro Assessment Handoff:  Hypoglycemic - CBG checks. A/ox4, alert.  GI - came in with vomiting had one episode here in ED. Given medications feeling better.  Swallow screen pass? Yes  Cardiac Rhythm: Sinus tachycardia       Neuro Assessment: Within Defined Limits Neuro Checks:      Has TPA been given? No If patient is a Neuro Trauma and patient is going to OR before floor call report to 4N Charge nurse: [phone]  or [phone]   R Recommendations: See Admitting Provider Note  Report given to:   Additional Notes:

## 2023-04-13 ENCOUNTER — Other Ambulatory Visit: Payer: Self-pay

## 2023-04-13 ENCOUNTER — Ambulatory Visit: Payer: 59 | Admitting: Podiatry

## 2023-04-13 ENCOUNTER — Ambulatory Visit: Payer: 59

## 2023-04-13 DIAGNOSIS — R9431 Abnormal electrocardiogram [ECG] [EKG]: Secondary | ICD-10-CM | POA: Diagnosis not present

## 2023-04-13 DIAGNOSIS — E785 Hyperlipidemia, unspecified: Secondary | ICD-10-CM

## 2023-04-13 DIAGNOSIS — G9341 Metabolic encephalopathy: Secondary | ICD-10-CM

## 2023-04-13 DIAGNOSIS — F200 Paranoid schizophrenia: Secondary | ICD-10-CM

## 2023-04-13 DIAGNOSIS — R4182 Altered mental status, unspecified: Secondary | ICD-10-CM

## 2023-04-13 DIAGNOSIS — N179 Acute kidney failure, unspecified: Secondary | ICD-10-CM

## 2023-04-13 DIAGNOSIS — I10 Essential (primary) hypertension: Secondary | ICD-10-CM

## 2023-04-13 DIAGNOSIS — E039 Hypothyroidism, unspecified: Secondary | ICD-10-CM

## 2023-04-13 DIAGNOSIS — D61818 Other pancytopenia: Secondary | ICD-10-CM

## 2023-04-13 DIAGNOSIS — G9340 Encephalopathy, unspecified: Secondary | ICD-10-CM | POA: Diagnosis not present

## 2023-04-13 DIAGNOSIS — D696 Thrombocytopenia, unspecified: Secondary | ICD-10-CM

## 2023-04-13 DIAGNOSIS — E162 Hypoglycemia, unspecified: Secondary | ICD-10-CM | POA: Diagnosis not present

## 2023-04-13 LAB — CBC
HCT: 26.9 % — ABNORMAL LOW (ref 39.0–52.0)
Hemoglobin: 9.3 g/dL — ABNORMAL LOW (ref 13.0–17.0)
MCH: 27.8 pg (ref 26.0–34.0)
MCHC: 34.6 g/dL (ref 30.0–36.0)
MCV: 80.5 fL (ref 80.0–100.0)
Platelets: 21 10*3/uL — CL (ref 150–400)
RBC: 3.34 MIL/uL — ABNORMAL LOW (ref 4.22–5.81)
RDW: 13.7 % (ref 11.5–15.5)
WBC: 2.4 10*3/uL — ABNORMAL LOW (ref 4.0–10.5)
nRBC: 0 % (ref 0.0–0.2)

## 2023-04-13 LAB — GLUCOSE, CAPILLARY
Glucose-Capillary: 101 mg/dL — ABNORMAL HIGH (ref 70–99)
Glucose-Capillary: 123 mg/dL — ABNORMAL HIGH (ref 70–99)
Glucose-Capillary: 147 mg/dL — ABNORMAL HIGH (ref 70–99)
Glucose-Capillary: 126 mg/dL — ABNORMAL HIGH (ref 70–99)
Glucose-Capillary: 119 mg/dL — ABNORMAL HIGH (ref 70–99)
Glucose-Capillary: 150 mg/dL — ABNORMAL HIGH (ref 70–99)
Glucose-Capillary: 149 mg/dL — ABNORMAL HIGH (ref 70–99)
Glucose-Capillary: 106 mg/dL — ABNORMAL HIGH (ref 70–99)
Glucose-Capillary: 98 mg/dL (ref 70–99)
Glucose-Capillary: 138 mg/dL — ABNORMAL HIGH (ref 70–99)

## 2023-04-13 LAB — BASIC METABOLIC PANEL
Anion gap: 9 (ref 5–15)
BUN: 59 mg/dL — ABNORMAL HIGH (ref 6–20)
CO2: 31 mmol/L (ref 22–32)
Calcium: 8.7 mg/dL — ABNORMAL LOW (ref 8.9–10.3)
Chloride: 97 mmol/L — ABNORMAL LOW (ref 98–111)
Creatinine, Ser: 3.01 mg/dL — ABNORMAL HIGH (ref 0.61–1.24)
GFR, Estimated: 24 mL/min — ABNORMAL LOW (ref >60)
Glucose, Bld: 123 mg/dL — ABNORMAL HIGH (ref 70–99)
Potassium: 4.5 mmol/L (ref 3.5–5.1)
Sodium: 137 mmol/L (ref 135–145)

## 2023-04-13 LAB — MRSA NEXT GEN BY PCR, NASAL: MRSA by PCR Next Gen: NOT DETECTED

## 2023-04-13 LAB — GLUCOSE, RANDOM: Glucose, Bld: 119 mg/dL — ABNORMAL HIGH (ref 70–99)

## 2023-04-13 LAB — PROCALCITONIN: Procalcitonin: <0.1 ng/mL

## 2023-04-13 MED ORDER — DIVALPROEX SODIUM 250 MG PO DR TAB
750.0000 mg | DELAYED_RELEASE_TABLET | Freq: Two times a day (BID) | ORAL | Status: DC
  Filled 2023-04-13: qty 3

## 2023-04-13 MED ORDER — DEXTROSE 5 % IV SOLN: INTRAVENOUS | Status: DC

## 2023-04-13 MED ORDER — LACTULOSE 10 GM/15ML PO SOLN
20.0000 g | Freq: Three times a day (TID) | ORAL | Status: DC
  Administered 2023-04-13 (×2): 20 g via ORAL
  Filled 2023-04-13 (×2): qty 30

## 2023-04-13 NOTE — Procedures (Signed)
History: 52 year old presenting with encephalopathy  Sedation: None  Technique: This EEG was acquired with electrodes placed according to the International 10-20 electrode system (including Fp1, Fp2, F3, F4, C3, C4, P3, P4, O1, O2, T3, T4, T5, T6, A1, A2, Fz, Cz, Pz). The following electrodes were missing or displaced: none.   Background: The background is dominated by low voltage generalized irregular slow activity, predominantly delta range with superimposed 5-6 hz theta.  With stimulation, there is briefly seen posterior dominant rhythm achieving a frequency of 7 to 8 Hz, but this is poorly sustained.  Photic stimulation: Physiologic driving is not performed  EEG Abnormalities: 1) generalized irregular slow activity  Clinical Interpretation: This EEG is consistent with a generalized nonspecific cerebral dysfunction (encephalopathy). There was no seizure or seizure predisposition recorded on this study. Please note that lack of epileptiform activity on EEG does not preclude the possibility of epilepsy.   Ritta Slot, MD [facility] [phone]  If 7pm- 7am, please page neurology on call as listed in AMION.

## 2023-04-13 NOTE — Plan of Care (Signed)
Patient has been very sleepy but oriented to person and place.  Lactulose started for ammonia and D10 reduced to D5 at 75.  CBGs have been controled to this point.

## 2023-04-13 NOTE — Progress Notes (Signed)
Progress Note   Patient: Derrick Atkins XBJ:[id number] DOB: Feb 20, 1971 DOA: 04/12/2023     1 DOS: the patient was seen and examined on 04/13/2023   Brief hospital course: Derrick Atkins is a 52 year old male with history of cerebral palsy, depression, non-insulin-dependent diabetes mellitus, hypertension, schizoaffective disorder, scoliosis, who presents emergency department for chief concerns of nausea, vomiting, shortness of breath. Patient's blood sugars found to be very low at 35, renal function worsening 3.6 platelet count low at 35 admitted to hospitalist service for further management evaluation of altered mental status, hypoglycemia, acute kidney injury.   Assessment and Plan: * Hypoglycemia In the setting of poor oral intake, nausea, vomiting. Blood sugars improved.  Will change fluids to D5 at 75 mill per hour. Continue to monitor blood sugars every 4 hourly then ACHS once eating good. If stable we will transfer him to progressive care unit.  Metabolic encephalopathy Patient presented with very low sugars, acute kidney injury, high ammonia level.  CT head unremarkable. Patient is on Depakote which can increase ammonia level.  Discussed with neurologist who will adjust the dose of Depakote. Patient seems to be on Depakote for schizoaffective disorder. Lactulose 3 times daily, titrate to 2-3 bowel movements. Continue neurochecks every 4 hourly. Fall, aspiration precautions.  AKI (acute kidney injury) (HCC) Suspect secondary to prerenal in setting of nausea and vomiting and poor p.o. intake. He probably has urinary retention.  RN did in and out with 750 mL urine. Bladder scan per floor protocol and if continues to retain we will place Foley ordered. Monitor daily renal function.  Essential hypertension IV Hydralazine 5 mg every 6 hours as needed for SBP greater 165, 4 days ordered. If patient is more alert and awake we will start his home regimen.  Thrombocytopenia  (HCC) Platelet count dropped to 20.  No active bleeding noted. Depakote can cause thrombocytopenia. Will stop Depakote for now. Continue to monitor CBC daily.  Hypothyroidism Levothyroxine 88 mcg daily resumed  Dyslipidemia Rosuvastatin 5 mg daily resumed  Schizophrenia, paranoid, chronic (HCC) With history of schizoaffective disorder Depakote held due to adverse effects, allergy list updated. Zyprexa, Cogentin 1 mg daily resumed  Leg swelling Trace bilateral lower extremity swelling Check LFTs  DM2 (diabetes mellitus, type 2) with hypoglycemia. A1c on 12/13/2022 was 4.3.   No need for sliding scale insulin. Encourage oral diet, supplements. Continue D5 at 75 mL.     Out of bed to chair. Incentive spirometry. Nursing supportive care. Fall, aspiration precautions. DVT prophylaxis   Code Status: Full Code Subjective: Patient is seen and examined today.  He is able to answer me appropriately but sleeps easily.  As per RN more awake this a.m., he got trazodone last night, was able to eat his breakfast.  Physical Exam: Vitals:   04/13/23 0700 04/13/23 0800 04/13/23 0900 04/13/23 1000  BP: 126/73 (!) 145/75 (!) 141/74 128/66  Pulse: 77 78 78 76  Resp: 17 14 15 17   Temp:  98 F (36.7 C)    TempSrc:  Oral    SpO2: 100% 100% 100% 100%  Weight:      Height:        General - Middle aged Caucasian male, sleeping lethargic, no acute distress HEENT - PERRLA, EOMI, atraumatic head, non tender sinuses. Lung - Clear, diffuse rales, no rhonchi, wheezes. Heart - S1, S2 heard, no murmurs, rubs, trace pedal edema. Abdomen - Soft, non tender nondistended, bowel sounds good Neuro -lethargic, easily arousable, non focal exam. Skin -  Warm and dry.  Data Reviewed:      Latest Ref Rng & Units 04/13/2023    5:58 AM 04/12/2023    1:51 PM 02/28/2023    4:08 AM  CBC  WBC 4.0 - 10.5 K/uL 2.4  3.7    3.6  5.4   Hemoglobin 13.0 - 17.0 g/dL 9.3  84.1    66.0  9.5   Hematocrit 39.0 -  52.0 % 26.9  36.9    36.2  28.9   Platelets 150 - 400 K/uL 21  35    34  105       Latest Ref Rng & Units 04/13/2023    5:58 AM 04/13/2023   12:51 AM 04/12/2023   10:36 PM  BMP  Glucose 70 - 99 mg/dL 630  160  109   BUN 6 - 20 mg/dL 59     Creatinine 3.23 - 1.24 mg/dL 5.57     Sodium 322 - 025 mmol/L 137     Potassium 3.5 - 5.1 mmol/L 4.5     Chloride 98 - 111 mmol/L 97     CO2 22 - 32 mmol/L 31     Calcium 8.9 - 10.3 mg/dL 8.7      CT HEAD WO CONTRAST ( )  Result Date: 04/12/2023 CLINICAL DATA:  Altered mental status. EXAM: CT HEAD WITHOUT CONTRAST TECHNIQUE: Contiguous axial images were obtained from the base of the skull through the vertex without intravenous contrast. RADIATION DOSE REDUCTION: This exam was performed according to the departmental dose-optimization program which includes automated exposure control, adjustment of the mA and/or kV according to patient size and/or use of iterative reconstruction technique. COMPARISON:  Head CT dated 02/21/2023. FINDINGS: Brain: The ventricles and sulci are appropriate size for the patient's age. The gray-white matter discrimination is preserved. There is no acute intracranial hemorrhage. No mass effect or midline shift. No extra-axial fluid collection. Vascular: No hyperdense vessel or unexpected calcification. Skull: Normal. Negative for fracture or focal lesion. Sinuses/Orbits: No acute finding. Other: None IMPRESSION: Unremarkable noncontrast CT of the brain. Electronically Signed   By: Elgie Collard M.D.   On: 04/12/2023 22:19   CT ABDOMEN PELVIS WO CONTRAST  Result Date: 04/12/2023 CLINICAL DATA:  Nausea short of breath EXAM: CT ABDOMEN AND PELVIS WITHOUT CONTRAST TECHNIQUE: Multidetector CT imaging of the abdomen and pelvis was performed following the standard protocol without IV contrast. RADIATION DOSE REDUCTION: This exam was performed according to the departmental dose-optimization program which includes automated exposure  control, adjustment of the mA and/or kV according to patient size and/or use of iterative reconstruction technique. COMPARISON:  CT 01/06/2023 FINDINGS: Lower chest: Lung bases demonstrate no acute airspace disease. Hepatobiliary: No focal liver abnormality is seen. No gallstones, gallbladder wall thickening, or biliary dilatation. Pancreas: Atrophic. No pancreatic ductal dilatation or surrounding inflammatory changes. Spleen: Normal in size without focal abnormality. Adrenals/Urinary Tract: Adrenal glands are within normal limits. Kidneys show no hydronephrosis. Punctate nonobstructing kidney stones. The bladder is slightly thick walled. Stomach/Bowel: The stomach is nonenlarged. 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 Large stool in the colon. No acute bowel wall thickening or obstructive features. Moderate rectal distension by feces Vascular/Lymphatic: Mild aortic atherosclerosis. No aneurysm. No suspicious lymph nodes. Reproductive: Prostate is unremarkable. Other: Negative for pelvic effusion or free air Musculoskeletal: No acute or suspicious abnormality. IMPRESSION: 1. Negative  for hydronephrosis. Punctate nonobstructing kidney stones. 2. No evidence for bowel obstruction or bowel wall thickening. 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 3. Slightly thick-walled bladder, correlate with urinalysis. 4. Aortic atherosclerosis. Aortic Atherosclerosis (ICD10-I70.0). Electronically Signed   By: Jasmine Pang M.D.   On: 04/12/2023 19:14   DG Chest 2 View  Result Date: 04/12/2023 CLINICAL DATA:  Chest pain. EXAM: CHEST - 2 VIEW COMPARISON:  04/11/2022. FINDINGS: Patchy areas of scarring/atelectasis noted at the right lung base. Bilateral lung fields are otherwise  clear. No acute consolidation or lung collapse. Bilateral costophrenic angles are clear. Normal cardio-mediastinal silhouette. No acute osseous abnormalities. The soft tissues are within normal limits. IMPRESSION: No active cardiopulmonary disease. Electronically Signed   By: Jules Schick M.D.   On: 04/12/2023 14:30     Family Communication: No family at bedside.  Will call family to update regarding his current care.   Disposition: Status is: Inpatient Remains inpatient appropriate because: Neurology workup, hypoglycemia, acute kidney injury  Planned Discharge Destination:  Group home     Time spent: 42 minutes  Author: Marcelino Duster, MD 04/13/2023 1:00 PM Secure chat 7am to 7pm For on call review [email or web address].

## 2023-04-13 NOTE — Consult Note (Signed)
Neurology Consultation Reason for Consult: Encephalopathy Referring Physician: Faye Ramsay  CC: Encephalopathy  History is obtained from: Chart review, patient  HPI: Derrick Atkins is a 52 y.o. male with a history of diabetes, schizoaffective disorder, hypertension who presents with encephalopathy.  He was noted to have extremely low glucose and his encephalopathy was initially attributed to this.  Despite improvement in his glucose, however he continued to be encephalopathic therefore an ammonia was checked which is 175.  This morning, when he arrived to the floor at almost 5 AM some medications including trazodone were given.  Neurology has been consulted to comment on his encephalopathy.  Of note, he has cerebral palsy and has always had right-sided weakness and numbness.  Past Medical History:  Diagnosis Date   Cerebral palsy (HCC)    Depression    Diabetes mellitus without complication (HCC)    Hypertension    Kidney stones    Schizoaffective disorder (HCC)    Scoliosis      Family History  Problem Relation Age of Onset   Heart disease Father    Heart attack Father      Social History:  reports that he has never smoked. He has never been exposed to tobacco smoke. He has never used smokeless tobacco. He reports that he does not drink alcohol and does not use drugs.   Exam: Current vital signs: BP 128/66   Pulse 76   Temp 98 F (36.7 C) (Oral)   Resp 17   Ht 6\' 3"  (1.905 m)   Wt 90.1 kg   SpO2 100%   BMI 24.83 kg/m  Vital signs in last 24 hours: Temp:  [97.9 F (36.6 C)-98.2 F (36.8 C)] 98 F (36.7 C) (10/02 0800) Pulse Rate:  [36-90] 76 (10/02 1000) Resp:  [11-27] 17 (10/02 1000) BP: (124-163)/(66-85) 128/66 (10/02 1000) SpO2:  [92 %-100 %] 100 % (10/02 1000)   Physical Exam  Appears well-developed and well-nourished.   Neuro: Mental Status: Patient is lethargic but arousable, oriented to person, place, year, but gives month as  September. Patient is able to give a clear and coherent history. No signs of aphasia or neglect Cranial Nerves: II: Visual Fields are full. Pupils are equal, round, and reactive to light.   III,IV, VI: EOMI without ptosis or diploplia.  V: Facial sensation is reduced on right VII: Facial  VIII: hearing is intact to voice X: Uvula elevates symmetrically XI: Shoulder shrug is symmetric. XII: tongue is midline without atrophy or fasciculations.  Motor: Tone is normal. Bulk is normal. 5/5 strength was present on the left, 4/5 on the right Sensory: Diminished on the right  Cerebellar: No ataxia  I have reviewed labs in epic and the results pertinent to this consultation are: Ammonia 175 Creatinine 3.01 with a BUN of 59 Lowest glucose was 38  I have reviewed the images obtained: CT head is negative  Impression: 52 year old male with improving encephalopathy which is likely toxic/metabolic due to AKI, hypoglycemia, and hyperammonemia.  Given his report that he has had an episode of hyperammonemia previously, I would consider Depakote contraindicated from this point moving forward.  I have added this to his allergies.  He does not have a history of seizures, but is on it for mood stabilization.  Could consider discussing other mood stabilizers with psychiatry.  Recommendations: 1) discontinue Depakote 2) workup for other causes of AKI and hyperammonemia per internal medicine 3) neurology will be available as needed   Ritta Slot, MD [facility] [phone]  If 7pm- 7am, please page neurology on call as listed in AMION.

## 2023-04-13 NOTE — Plan of Care (Signed)

## 2023-04-13 NOTE — Progress Notes (Signed)
Eeg done 

## 2023-04-14 ENCOUNTER — Encounter: Payer: Self-pay | Admitting: Internal Medicine

## 2023-04-14 DIAGNOSIS — D61818 Other pancytopenia: Secondary | ICD-10-CM | POA: Diagnosis not present

## 2023-04-14 DIAGNOSIS — N179 Acute kidney failure, unspecified: Secondary | ICD-10-CM | POA: Diagnosis not present

## 2023-04-14 DIAGNOSIS — E162 Hypoglycemia, unspecified: Secondary | ICD-10-CM | POA: Diagnosis not present

## 2023-04-14 DIAGNOSIS — G9341 Metabolic encephalopathy: Secondary | ICD-10-CM | POA: Diagnosis not present

## 2023-04-14 LAB — CBC WITH DIFFERENTIAL/PLATELET
Abs Immature Granulocytes: 0.01 10*3/uL (ref 0.00–0.07)
Basophils Absolute: 0 10*3/uL (ref 0.0–0.1)
Basophils Relative: 1 %
Eosinophils Absolute: 0.1 10*3/uL (ref 0.0–0.5)
Eosinophils Relative: 3 %
HCT: 29.1 % — ABNORMAL LOW (ref 39.0–52.0)
Hemoglobin: 9.8 g/dL — ABNORMAL LOW (ref 13.0–17.0)
Immature Granulocytes: 0 %
Lymphocytes Relative: 39 %
Lymphs Abs: 1.2 10*3/uL (ref 0.7–4.0)
MCH: 28.1 pg (ref 26.0–34.0)
MCHC: 33.7 g/dL (ref 30.0–36.0)
MCV: 83.4 fL (ref 80.0–100.0)
Monocytes Absolute: 0.4 10*3/uL (ref 0.1–1.0)
Monocytes Relative: 13 %
Neutro Abs: 1.4 10*3/uL — ABNORMAL LOW (ref 1.7–7.7)
Neutrophils Relative %: 44 %
Platelets: 24 10*3/uL — CL (ref 150–400)
RBC: 3.49 MIL/uL — ABNORMAL LOW (ref 4.22–5.81)
RDW: 13.4 % (ref 11.5–15.5)
WBC: 3.2 10*3/uL — ABNORMAL LOW (ref 4.0–10.5)
nRBC: 0 % (ref 0.0–0.2)

## 2023-04-14 LAB — HEPATIC FUNCTION PANEL
ALT: 12 U/L (ref 0–44)
AST: 25 U/L (ref 15–41)
Albumin: 3.6 g/dL (ref 3.5–5.0)
Alkaline Phosphatase: 75 U/L (ref 38–126)
Bilirubin, Direct: 0.8 mg/dL — ABNORMAL HIGH (ref 0.0–0.2)
Indirect Bilirubin: 1.3 mg/dL — ABNORMAL HIGH (ref 0.3–0.9)
Total Bilirubin: 2.1 mg/dL — ABNORMAL HIGH (ref 0.3–1.2)
Total Protein: 6.2 g/dL — ABNORMAL LOW (ref 6.5–8.1)

## 2023-04-14 LAB — MAGNESIUM: Magnesium: 2.9 mg/dL — ABNORMAL HIGH (ref 1.7–2.4)

## 2023-04-14 LAB — BASIC METABOLIC PANEL
Anion gap: 14 (ref 5–15)
BUN: 45 mg/dL — ABNORMAL HIGH (ref 6–20)
CO2: 29 mmol/L (ref 22–32)
Calcium: 9.1 mg/dL (ref 8.9–10.3)
Chloride: 94 mmol/L — ABNORMAL LOW (ref 98–111)
Creatinine, Ser: 2.68 mg/dL — ABNORMAL HIGH (ref 0.61–1.24)
GFR, Estimated: 28 mL/min — ABNORMAL LOW (ref >60)
Glucose, Bld: 142 mg/dL — ABNORMAL HIGH (ref 70–99)
Potassium: 4.2 mmol/L (ref 3.5–5.1)
Sodium: 137 mmol/L (ref 135–145)

## 2023-04-14 LAB — GLUCOSE, CAPILLARY
Glucose-Capillary: 121 mg/dL — ABNORMAL HIGH (ref 70–99)
Glucose-Capillary: 196 mg/dL — ABNORMAL HIGH (ref 70–99)
Glucose-Capillary: 139 mg/dL — ABNORMAL HIGH (ref 70–99)
Glucose-Capillary: 129 mg/dL — ABNORMAL HIGH (ref 70–99)
Glucose-Capillary: 162 mg/dL — ABNORMAL HIGH (ref 70–99)

## 2023-04-14 LAB — PHOSPHORUS: Phosphorus: 3 mg/dL (ref 2.5–4.6)

## 2023-04-14 LAB — TROPONIN I (HIGH SENSITIVITY)
Troponin I (High Sensitivity): 4 ng/L (ref <18)
Troponin I (High Sensitivity): 7 ng/L (ref <18)

## 2023-04-14 LAB — AMMONIA: Ammonia: 113 umol/L — ABNORMAL HIGH (ref 9–35)

## 2023-04-14 MED ORDER — LACTULOSE 10 GM/15ML PO SOLN
20.0000 g | Freq: Three times a day (TID) | ORAL | Status: DC
  Administered 2023-04-14 (×2): 20 g via ORAL
  Filled 2023-04-14 (×3): qty 30

## 2023-04-14 MED ORDER — LACTULOSE ENEMA
300.0000 mL | Freq: Two times a day (BID) | ORAL | Status: DC
  Filled 2023-04-14: qty 300

## 2023-04-14 MED ORDER — LACTULOSE ENEMA
300.0000 mL | Freq: Two times a day (BID) | ORAL | Status: DC
  Filled 2023-04-14 (×3): qty 300

## 2023-04-14 NOTE — Progress Notes (Signed)
Patient has went back and forth of being Alert and sleepy throughout shift. Patient able to nod and answer person,birthdate and place even while sleepy.Patient awoken several times throughout the night to eat a dinner tray, snacks and soda through out shift. Blood glucose stable. Patient able to empty bladder throughout shift using purewick. Patients vitals stable throughout shift.

## 2023-04-14 NOTE — Progress Notes (Signed)
Tele monitor alarming for ST elevation. Patients vitals stable no complaints of chest pain. EKG obtained and Dr Arville Care contacted.  Dr Arville Care replied We can get 2 sets of troponin I but the EKG does not show significant ST segment elevation .

## 2023-04-14 NOTE — Progress Notes (Signed)
Progress Note   Patient: Derrick Atkins ZOX:[id number] DOB: 01/09/1971 DOA: 04/12/2023     2 DOS: the patient was seen and examined on 04/14/2023   Brief hospital course: Mr. Derrick Atkins is a 52 year old male with history of cerebral palsy, depression, non-insulin-dependent diabetes mellitus, hypertension, schizoaffective disorder, scoliosis, who presents emergency department for chief concerns of nausea, vomiting, shortness of breath. Patient's blood sugars found to be very low at 35, renal function worsening 3.6, platelet count low at 35 admitted to hospitalist service for further management evaluation of altered mental status, hypoglycemia, acute kidney injury.  Assessment and Plan: * Hypoglycemia In the setting of poor oral intake, nausea, vomiting. Blood sugars improved.   Continue fluids to D5 at 75 mill per hour if not eating. Continue to monitor blood sugars ACHS once eating good. If stable we will transfer him to progressive care unit.  Metabolic encephalopathy Patient presented with very low sugars, acute kidney injury, high ammonia level vs schizophrenia medications.  CT head unremarkable. He is still lethargic. Patient is on Depakote which can increase ammonia level.   Stop depakote. Will also hold Zyprexa, cogentin given his lethargy. Patient seems to be on Depakote for schizoaffective disorder. Lactulose 3 times daily, titrate to 2-3 bowel movements changed to rectal if needed. Continue neurochecks every 4 hourly. Fall, aspiration precautions.  AKI (acute kidney injury) (HCC) Suspect secondary to prerenal in setting of nausea and vomiting and poor p.o. intake. He probably has urinary retention.  He has good urine output today. Kidney function improving. No urinary retention. Avoid nephrotoxic drugs. Monitor daily renal function.  Essential hypertension IV Hydralazine 5 mg every 6 hours as needed for SBP greater 165, 4 days ordered. If patient is more alert and  awake we will start his home regimen.  Pancytytopenia Thrombocytopenia (HCC) Hb 9.8. WBC 3.2, Platelets around 24.  No active bleeding noted. He has h/o ETOH, Depakote can also cause thrombocytopenia.  Depakote stopped for now. Continue to monitor CBC daily.  Hypothyroidism Levothyroxine 88 mcg daily resumed  Dyslipidemia Rosuvastatin 5 mg daily if he is awake.  Schizophrenia, paranoid, chronic (HCC) With history of schizoaffective disorder Depakote held due to adverse effects, allergy list updated. Stopped Zyprexa, Cogentin due to his lethargy.  Leg swelling Trace bilateral lower extremity swelling. Leg elevation. SCD.     Out of bed to chair. Incentive spirometry. Nursing supportive care. Fall, aspiration precautions. DVT prophylaxis   Code Status: Full Code  Subjective: Patient is seen and examined today.  He is sleepy, unable to open eyes. During afternoon RN notified that he is more awake.   Physical Exam: Vitals:   04/14/23 1000 04/14/23 1100 04/14/23 1200 04/14/23 1300  BP: 115/61 127/66 123/62 123/68  Pulse: 74 74 72 68  Resp: 19 14 16 20   Temp:   (!) 97.5 F (36.4 C)   TempSrc:   Oral   SpO2: 99% 100% 99% 100%  Weight:      Height:        General - Middle aged Caucasian male, sleeping lethargic, no acute distress HEENT - PERRLA, EOMI, atraumatic head, non tender sinuses. Lung - Clear, diffuse rales, no rhonchi, wheezes. Heart - S1, S2 heard, no murmurs, rubs, trace pedal edema. Abdomen - Soft, non tender nondistended, bowel sounds good Neuro -lethargic, unable to do full neuro exam. Skin - Warm and dry.  Data Reviewed:      Latest Ref Rng & Units 04/14/2023    6:09 AM 04/13/2023  5:58 AM 04/12/2023    1:51 PM  CBC  WBC 4.0 - 10.5 K/uL 3.2  2.4  3.7    3.6   Hemoglobin 13.0 - 17.0 g/dL 9.8  9.3  47.8    29.5   Hematocrit 39.0 - 52.0 % 29.1  26.9  36.9    36.2   Platelets 150 - 400 K/uL 24  21  35    34       Latest Ref Rng & Units  04/14/2023    1:48 AM 04/13/2023    5:58 AM 04/13/2023   12:51 AM  BMP  Glucose 70 - 99 mg/dL 621  308  657   BUN 6 - 20 mg/dL 45  59    Creatinine 8.46 - 1.24 mg/dL 9.62  9.52    Sodium 841 - 145 mmol/L 137  137    Potassium 3.5 - 5.1 mmol/L 4.2  4.5    Chloride 98 - 111 mmol/L 94  97    CO2 22 - 32 mmol/L 29  31    Calcium 8.9 - 10.3 mg/dL 9.1  8.7     EEG adult  Result Date: 04/13/2023 Rejeana Brock, MD     04/13/2023  3:52 PM History: 52 year old presenting with encephalopathy Sedation: None Technique: This EEG was acquired with electrodes placed according to the International 10-20 electrode system (including Fp1, Fp2, F3, F4, C3, C4, P3, P4, O1, O2, T3, T4, T5, T6, A1, A2, Fz, Cz, Pz). The following electrodes were missing or displaced: none. Background: The background is dominated by low voltage generalized irregular slow activity, predominantly delta range with superimposed 5-6 hz theta.  With stimulation, there is briefly seen posterior dominant rhythm achieving a frequency of 7 to 8 Hz, but this is poorly sustained. Photic stimulation: Physiologic driving is not performed EEG Abnormalities: 1) generalized irregular slow activity Clinical Interpretation: This EEG is consistent with a generalized nonspecific cerebral dysfunction (encephalopathy). There was no seizure or seizure predisposition recorded on this study. Please note that lack of epileptiform activity on EEG does not preclude the possibility of epilepsy. Ritta Slot, MD [facility] [phone] If 7pm- 7am, please page neurology on call as listed in AMION.  CT HEAD WO CONTRAST ( )  Result Date: 04/12/2023 CLINICAL DATA:  Altered mental status. EXAM: CT HEAD WITHOUT CONTRAST TECHNIQUE: Contiguous axial images were obtained from the base of the skull through the vertex without intravenous contrast. RADIATION DOSE REDUCTION: This exam was performed according to the departmental dose-optimization program  which includes automated exposure control, adjustment of the mA and/or kV according to patient size and/or use of iterative reconstruction technique. COMPARISON:  Head CT dated 02/21/2023. FINDINGS: Brain: The ventricles and sulci are appropriate size for the patient's age. The gray-white matter discrimination is preserved. There is no acute intracranial hemorrhage. No mass effect or midline shift. No extra-axial fluid collection. Vascular: No hyperdense vessel or unexpected calcification. Skull: Normal. Negative for fracture or focal lesion. Sinuses/Orbits: No acute finding. Other: None IMPRESSION: Unremarkable noncontrast CT of the brain. Electronically Signed   By: Elgie Collard M.D.   On: 04/12/2023 22:19   CT ABDOMEN PELVIS WO CONTRAST  Result Date: 04/12/2023 CLINICAL DATA:  Nausea short of breath EXAM: CT ABDOMEN AND PELVIS WITHOUT CONTRAST TECHNIQUE: Multidetector CT imaging of the abdomen and pelvis was performed following the standard protocol without IV contrast. RADIATION DOSE REDUCTION: This exam was performed according to the departmental dose-optimization program which includes automated exposure control, adjustment of the mA  and/or kV according to patient size and/or use of iterative reconstruction technique. COMPARISON:  CT 01/06/2023 FINDINGS: Lower chest: Lung bases demonstrate no acute airspace disease. Hepatobiliary: No focal liver abnormality is seen. No gallstones, gallbladder wall thickening, or biliary dilatation. Pancreas: Atrophic. No pancreatic ductal dilatation or surrounding inflammatory changes. Spleen: Normal in size without focal abnormality. Adrenals/Urinary Tract: Adrenal glands are within normal limits. Kidneys show no hydronephrosis. Punctate nonobstructing kidney stones. The bladder is slightly thick walled. Stomach/Bowel: The stomach is nonenlarged. 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 Large stool in the colon. No acute bowel wall thickening or obstructive features. Moderate rectal distension by feces Vascular/Lymphatic: Mild aortic atherosclerosis. No aneurysm. No suspicious lymph nodes. Reproductive: Prostate is unremarkable. Other: Negative for pelvic effusion or free air Musculoskeletal: No acute or suspicious abnormality. IMPRESSION: 1. Negative for hydronephrosis. Punctate nonobstructing kidney stones. 2. No evidence for bowel obstruction or bowel wall thickening. 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 3. Slightly thick-walled bladder, correlate with urinalysis. 4. Aortic atherosclerosis. Aortic Atherosclerosis (ICD10-I70.0). Electronically Signed   By: Jasmine Pang M.D.   On: 04/12/2023 19:14     Family Communication: No family at bedside.  Will call family to update regarding his current care.   Disposition: Status is: Inpatient Remains inpatient appropriate because: lethargy, pancytopenia, acute kidney injury work up and monitoring.  Planned Discharge Destination:  Group home     Time spent: 40 minutes  Author: Marcelino Duster, MD 04/14/2023 1:48 PM Secure chat 7am to 7pm For on call review [email or web address].

## 2023-04-14 NOTE — Plan of Care (Signed)

## 2023-04-15 DIAGNOSIS — E162 Hypoglycemia, unspecified: Secondary | ICD-10-CM | POA: Diagnosis not present

## 2023-04-15 DIAGNOSIS — D61818 Other pancytopenia: Secondary | ICD-10-CM | POA: Diagnosis not present

## 2023-04-15 DIAGNOSIS — G9341 Metabolic encephalopathy: Secondary | ICD-10-CM | POA: Diagnosis not present

## 2023-04-15 DIAGNOSIS — N179 Acute kidney failure, unspecified: Secondary | ICD-10-CM | POA: Diagnosis not present

## 2023-04-15 LAB — PROCALCITONIN: Procalcitonin: <0.1 ng/mL

## 2023-04-15 LAB — CBC
HCT: 33.4 % — ABNORMAL LOW (ref 39.0–52.0)
Hemoglobin: 11.6 g/dL — ABNORMAL LOW (ref 13.0–17.0)
MCH: 28.1 pg (ref 26.0–34.0)
MCHC: 34.7 g/dL (ref 30.0–36.0)
MCV: 80.9 fL (ref 80.0–100.0)
Platelets: 28 10*3/uL — CL (ref 150–400)
RBC: 4.13 MIL/uL — ABNORMAL LOW (ref 4.22–5.81)
RDW: 13.1 % (ref 11.5–15.5)
WBC: 6.6 10*3/uL (ref 4.0–10.5)
nRBC: 0 % (ref 0.0–0.2)

## 2023-04-15 LAB — COMPREHENSIVE METABOLIC PANEL
ALT: 12 U/L (ref 0–44)
AST: 22 U/L (ref 15–41)
Albumin: 3.6 g/dL (ref 3.5–5.0)
Alkaline Phosphatase: 88 U/L (ref 38–126)
Anion gap: 11 (ref 5–15)
BUN: 37 mg/dL — ABNORMAL HIGH (ref 6–20)
CO2: 30 mmol/L (ref 22–32)
Calcium: 9.3 mg/dL (ref 8.9–10.3)
Chloride: 95 mmol/L — ABNORMAL LOW (ref 98–111)
Creatinine, Ser: 1.99 mg/dL — ABNORMAL HIGH (ref 0.61–1.24)
GFR, Estimated: 40 mL/min — ABNORMAL LOW (ref >60)
Glucose, Bld: 133 mg/dL — ABNORMAL HIGH (ref 70–99)
Potassium: 3.6 mmol/L (ref 3.5–5.1)
Sodium: 136 mmol/L (ref 135–145)
Total Bilirubin: 1.6 mg/dL — ABNORMAL HIGH (ref 0.3–1.2)
Total Protein: 6.5 g/dL (ref 6.5–8.1)

## 2023-04-15 LAB — GLUCOSE, CAPILLARY
Glucose-Capillary: 145 mg/dL — ABNORMAL HIGH (ref 70–99)
Glucose-Capillary: 189 mg/dL — ABNORMAL HIGH (ref 70–99)

## 2023-04-15 LAB — HEMOGLOBIN A1C
Hgb A1c MFr Bld: 5.1 % (ref 4.8–5.6)
Mean Plasma Glucose: 99.67 mg/dL

## 2023-04-15 LAB — AMMONIA: Ammonia: 25 umol/L (ref 9–35)

## 2023-04-15 MED ORDER — CHLORHEXIDINE GLUCONATE CLOTH 2 % EX PADS
6.0000 | MEDICATED_PAD | Freq: Every day | CUTANEOUS | Status: DC
  Administered 2023-04-16 – 2023-04-17 (×2): 6 via TOPICAL

## 2023-04-15 MED ORDER — ALUM & MAG HYDROXIDE-SIMETH 200-200-20 MG/5ML PO SUSP: 30.0000 mL | ORAL | Status: DC | PRN

## 2023-04-15 MED ORDER — FUROSEMIDE 20 MG PO TABS: 20.0000 mg | ORAL_TABLET | Freq: Every morning | ORAL | Status: DC

## 2023-04-15 MED ORDER — ALBUTEROL SULFATE (2.5 MG/3ML) 0.083% IN NEBU: 3.0000 mL | INHALATION_SOLUTION | RESPIRATORY_TRACT | Status: DC | PRN

## 2023-04-15 MED ORDER — GEMFIBROZIL 600 MG PO TABS
600.0000 mg | ORAL_TABLET | Freq: Two times a day (BID) | ORAL | Status: DC
  Administered 2023-04-15 – 2023-04-18 (×6): 600 mg via ORAL
  Filled 2023-04-15 (×7): qty 1

## 2023-04-15 MED ORDER — BENZTROPINE MESYLATE 0.5 MG PO TABS
1.0000 mg | ORAL_TABLET | Freq: Every day | ORAL | Status: DC
  Administered 2023-04-15 – 2023-04-18 (×4): 1 mg via ORAL
  Filled 2023-04-15: qty 2
  Filled 2023-04-15: qty 1
  Filled 2023-04-15: qty 2
  Filled 2023-04-15: qty 1

## 2023-04-15 MED ORDER — ASPIRIN 81 MG PO TBEC
81.0000 mg | DELAYED_RELEASE_TABLET | Freq: Every day | ORAL | Status: DC
  Administered 2023-04-15 – 2023-04-18 (×4): 81 mg via ORAL
  Filled 2023-04-15 (×4): qty 1

## 2023-04-15 MED ORDER — SENNA 8.6 MG PO TABS
2.0000 | ORAL_TABLET | Freq: Every day | ORAL | Status: DC
  Administered 2023-04-15 – 2023-04-17 (×2): 17.2 mg via ORAL
  Filled 2023-04-15 (×2): qty 2

## 2023-04-15 MED ORDER — OLANZAPINE 10 MG PO TABS
30.0000 mg | ORAL_TABLET | Freq: Every day | ORAL | Status: DC
  Administered 2023-04-15 – 2023-04-17 (×3): 30 mg via ORAL
  Filled 2023-04-15 (×4): qty 3

## 2023-04-15 MED ORDER — FOLIC ACID 1 MG PO TABS
1.0000 mg | ORAL_TABLET | Freq: Every day | ORAL | Status: DC
  Administered 2023-04-15 – 2023-04-18 (×4): 1 mg via ORAL
  Filled 2023-04-15 (×4): qty 1

## 2023-04-15 MED ORDER — POLYSACCHARIDE IRON COMPLEX 150 MG PO CAPS
150.0000 mg | ORAL_CAPSULE | Freq: Every day | ORAL | Status: DC
  Administered 2023-04-15 – 2023-04-18 (×4): 150 mg via ORAL
  Filled 2023-04-15 (×4): qty 1

## 2023-04-15 NOTE — Progress Notes (Signed)
       CROSS COVER NOTE  NAME: Derrick Atkins MRN: [id number] DOB : Sep 29, 1970    Concern as stated by nurse / staff   Patient was started on D5 oin admission due to hy[oglycemia. Patient with cbg stable for 24 hours; eating and drinking well     Pertinent findings on chart review:   Assessment and  Interventions   Assessment: CBG (last 3)  Recent Labs    04/14/23 1130 04/14/23 1609 04/14/23 2159  GLUCAP 139* 129* 162*    Plan: Discontinue D5       Donnie Mesa NP [facility] Cross Cover 7pm-7am - check amion for availability Pager [phone]

## 2023-04-15 NOTE — Evaluation (Signed)
Occupational Therapy Evaluation Patient Details Name: Derrick Atkins MRN: [id number] DOB: 1971-02-28 Today's Date: 04/15/2023   History of Present Illness Rand Etchison is a 52 year old male with history of cerebral palsy, depression, non-insulin-dependent diabetes mellitus, hypertension, schizoaffective disorder, scoliosis, who presents emergency department for chief concerns of nausea, vomiting, shortness of breath. Patient's blood sugars found to be very low at 35 and admitted for altered mental status, hypoglycemia, acute kidney injury.   Clinical Impression   Mr Rosevear was seen for OT evaluation this date. Prior to hospital admission, pt was MOD I for mobility and ADLs. Pt lives at a group home. Pt currently requires MIN A + RW for bed>chair step pivot t/f. MIN A don gown in sitting, assist for LUE. Anticipate MAX A for LB access. Pt would benefit from skilled OT to address noted impairments and functional limitations (see below for any additional details). Upon hospital discharge, recommend OT follow up and +1 assist for all mobility needs.    If plan is discharge home, recommend the following: A little help with walking and/or transfers;A little help with bathing/dressing/bathroom;Help with stairs or ramp for entrance    Functional Status Assessment  Patient has had a recent decline in their functional status and demonstrates the ability to make significant improvements in function in a reasonable and predictable amount of time.  Equipment Recommendations  BSC/3in1    Recommendations for Other Services       Precautions / Restrictions Precautions Precautions: Fall Restrictions Weight Bearing Restrictions: No      Mobility Bed Mobility Overal bed mobility: Needs Assistance Bed Mobility: Supine to Sit     Supine to sit: Supervision     General bed mobility comments: cues to initiate and sequence    Transfers Overall transfer level: Needs assistance Equipment used:  Rolling walker (2 wheels) Transfers: Sit to/from Stand, Bed to chair/wheelchair/BSC Sit to Stand: Min assist     Step pivot transfers: Min assist            Balance Overall balance assessment: Needs assistance Sitting-balance support: No upper extremity supported, Feet supported Sitting balance-Leahy Scale: Good     Standing balance support: No upper extremity supported, During functional activity Standing balance-Leahy Scale: Poor Standing balance comment: posterior lean, unable to reach outside BOS                           ADL either performed or assessed with clinical judgement   ADL Overall ADL's : Needs assistance/impaired                                       General ADL Comments: MIN A + RW for simulated BSC t/f. MIN A don gown in sitting, assist for LUE. SETUP self-feeding in sitting      Pertinent Vitals/Pain Pain Assessment Pain Assessment: No/denies pain     Extremity/Trunk Assessment Upper Extremity Assessment Upper Extremity Assessment: Generalized weakness;Left hand dominant;LUE deficits/detail LUE Deficits / Details: hx of Cerebal Palsy with high tone noted   Lower Extremity Assessment Lower Extremity Assessment: Generalized weakness       Communication Communication Communication: Difficulty communicating thoughts/reduced clarity of speech;Difficulty following commands/understanding Following commands: Follows one step commands consistently;Follows one step commands with increased time   Cognition Arousal: Alert Behavior During Therapy: Flat affect Overall Cognitive Status: History of cognitive impairments - at baseline  Home Living Family/patient expects to be discharged to:: Group home Living Arrangements: Group Home   Type of Home: Group Home Home Access: Level entry     Home Layout: One level     Bathroom Shower/Tub: Tub/shower unit          Home Equipment: Agricultural consultant (2 wheels);Grab bars - tub/shower   Additional Comments: home setup per chart      Prior Functioning/Environment Prior Level of Function : Needs assist;History of Falls (last six months)             Mobility Comments: endorses multiple falls >10 in the last month          OT Problem List: Decreased strength;Decreased range of motion;Decreased activity tolerance;Impaired balance (sitting and/or standing);Decreased safety awareness      OT Treatment/Interventions: Therapeutic exercise;Self-care/ADL training;Energy conservation;DME and/or AE instruction;Therapeutic activities;Balance training;Patient/family education    OT Goals(Current goals can be found in the care plan section) Acute Rehab OT Goals Patient Stated Goal: to walk OT Goal Formulation: With patient Time For Goal Achievement: 04/29/23 Potential to Achieve Goals: Good ADL Goals Pt Will Perform Grooming: with modified independence;standing Pt Will Perform Lower Body Dressing: with modified independence;sit to/from stand Pt Will Transfer to Toilet: with modified independence;ambulating;regular height toilet Pt Will Perform Toileting - Clothing Manipulation and hygiene: with modified independence;sitting/lateral leans;sit to/from stand  OT Frequency: Min 1X/week    Co-evaluation              AM-PAC OT "6 Clicks" Daily Activity     Outcome Measure Help from another person eating meals?: None Help from another person taking care of personal grooming?: A Little Help from another person toileting, which includes using toliet, bedpan, or urinal?: A Lot Help from another person bathing (including washing, rinsing, drying)?: A Little Help from another person to put on and taking off regular upper body clothing?: A Little Help from another person to put on and taking off regular lower body clothing?: A Lot 6 Click Score: 17   End of Session Nurse Communication: Mobility  status  Activity Tolerance: Patient tolerated treatment well Patient left: in chair;with call bell/phone within reach;with chair alarm set  OT Visit Diagnosis: Other abnormalities of gait and mobility (R26.89);Muscle weakness (generalized) (M62.81)                Time: 7829-5621 OT Time Calculation (min): 34 min Charges:  OT General Charges $OT Visit: 1 Visit OT Evaluation $OT Eval Moderate Complexity: 1 Mod OT Treatments $Self Care/Home Management : 23-37 mins  Kathie Dike, M.S. OTR/L  04/15/23, 3:57 PM  ascom [phone]

## 2023-04-15 NOTE — TOC Initial Note (Signed)
Transition of Care [facility]) - Initial/Assessment Note    Patient Details  Name: Derrick Atkins MRN: [id number] Date of Birth: Jul 26, 1970  Transition of [facility]) CM/SW Contact:    Chapman Fitch, RN Phone Number: 04/15/2023, 11:04 AM  Clinical Narrative:                  Patient admitted for hypoglycemia  Per Chart patient admitted from [facility] = Voicemail left for [facility] group home owner [phone]  Patient's Guardian listed as Lenn Sink with [place] [employer] [phone].   Voicemail left  Per Quincy Carnes patient active with [facility] home health.  Message sent to [facility] with [facility] to determine what services patient is active with  Expected Discharge Plan: Group Home (with home health)     Patient Goals and CMS Choice            Expected Discharge Plan and Services                                              Prior Living Arrangements/Services                       Activities of Daily Living   ADL Screening (condition at time of admission) Independently performs ADLs?: Yes (appropriate for developmental age) Is the patient deaf or have difficulty hearing?: No Does the patient have difficulty seeing, even when wearing glasses/contacts?: No Does the patient have difficulty concentrating, remembering, or making decisions?: Yes  Permission Sought/Granted                  Emotional Assessment              Admission diagnosis:  Hypoglycemia [E16.2] Thrombocytopenia (HCC) [D69.6] AKI (acute kidney injury) (HCC) [N17.9] Nausea and vomiting, unspecified vomiting type [R11.2] Patient Active Problem List   Diagnosis Date Noted   Altered mental status 04/12/2023   Leg swelling 04/12/2023   Ambulatory dysfunction 02/26/2023   DM2 (diabetes mellitus, type 2) (HCC) 02/26/2023   Hyperlipidemia 02/26/2023   Prolonged QT interval 02/26/2023   Normocytic anemia 02/26/2023   Thrombocytopenia (HCC) 02/21/2023    Rhabdomyolysis 02/21/2023   Hypoglycemia 02/21/2023   Abnormal LFTs 02/21/2023   Fall at home, initial encounter 02/21/2023   Cellulitis of left lower extremity 01/06/2023   Iron deficiency anemia 01/06/2023   Cellulitis and abscess of left lower extremity 01/06/2023   Nausea & vomiting 01/06/2023   AKI (acute kidney injury) (HCC) 12/13/2022   Dyslipidemia 12/13/2022   Schizophrenia (HCC) 03/23/2022   Aggressive behavior    Schizophrenia, paranoid, chronic (HCC)    Long QT interval 04/16/2014   Hypothyroidism 12/25/2013   Essential hypertension 05/03/2007   Type 2 diabetes mellitus without complications (HCC) 05/03/2007   PCP:  Koren Bound, NP Pharmacy:   [facility] - [place], [place] - [address] [place] [address] Phone: [phone] Fax: [phone]  [facility] - [place], [place] - [address] [place] [address] Phone: [phone] Fax: [phone]     Social Determinants of Health (SDOH) Social History: SDOH Screenings   Food Insecurity: No Food Insecurity (04/14/2023)  Housing: Low Risk  (04/14/2023)  Transportation Needs: Unmet Transportation Needs (04/14/2023)  Utilities: Not At Risk (04/14/2023)  Alcohol Screen: Low Risk  (03/23/2022)  Tobacco Use: Low Risk  (04/14/2023)   SDOH Interventions:     Readmission Risk Interventions    04/15/2023   11:03 AM  Readmission Risk Prevention Plan  Transportation Screening Complete  HRI or Home Care Consult Complete  Social Work Consult for Recovery Care Planning/Counseling Complete  Medication Review Oceanographer) Complete

## 2023-04-15 NOTE — Progress Notes (Addendum)
Patient was visited by his social worker this am. Name: Janie Morning: [phone]. Discussed with social worker that patient reports that he (and other residents) are not treated well at the home. Per SW he does have disagreements with staff at the home and yells at them frequently. Patient verbalized to this RN that he sometimes needs help with toileting at the group home and sometimes wets himself  He also reports that he falls sometimes; denies that he uses a walker or a cane because he doesn't want to appear weak. There is transition of care consult in place to address placement. Incontinent of urine x 2 at 6 am and 1030 am. PT and OT consults are also in place.

## 2023-04-15 NOTE — Progress Notes (Signed)
Progress Note   Patient: Derrick Atkins ZOX:[id number] DOB: 02/15/1971 DOA: 04/12/2023     3 DOS: the patient was seen and examined on 04/15/2023   Brief hospital course: Mr. Jairus Tonne is a 52 year old male with history of cerebral palsy, depression, non-insulin-dependent diabetes mellitus, hypertension, schizoaffective disorder, scoliosis, who presents emergency department for chief concerns of nausea, vomiting, shortness of breath. Patient's blood sugars found to be very low at 35, renal function worsening 3.6, platelet count low at 35 admitted to hospitalist service for further management evaluation of altered mental status, hypoglycemia, acute kidney injury.  Assessment and Plan: * Hypoglycemia - resolved. States he is eating poor at the facility. Blood sugars improved now. Eating better. Stop IV fluids. Check A1c.  Continue to monitor blood sugars ACHS. Will transfer him to med surg floor.  Metabolic encephalopathy Multifactorial- initial presentation with very low sugars, acute kidney injury, high ammonia level and he is on schizophrenia medications.  CT head unremarkable. He is still lethargic. Stopped Depakote as it can increase ammonia level.   Zyprexa, cogentin held given his lethargy. Will resume if mental status better. Lactulose 3 times daily, titrate to 2-3 bowel movements. Continue neurochecks. Fall, aspiration precautions.  AKI (acute kidney injury) (HCC) Suspect secondary to prerenal in setting of nausea and vomiting and poor p.o. intake. He has good urine output today. Kidney function improving. No urinary retention. Avoid nephrotoxic drugs. Monitor daily renal function.  Essential hypertension IV Hydralazine 5 mg every 6 hours as needed for SBP greater 165. Will start metoprolol 25 bid.  Pancytytopenia Thrombocytopenia (HCC) Platelets around 28.  WBC, Hb improved. He has h/o ETOH, Depakote stopped cause of thrombocytopenia. Continue to monitor CBC  daily.  Hypothyroidism Continue Levothyroxine 88 mcg daily   Dyslipidemia Continue Rosuvastatin, Lopid.  Schizophrenia, paranoid, chronic (HCC) With history of schizoaffective disorder Depakote held due to adverse effects, allergy list updated. Stopped Zyprexa, Cogentin due to his lethargy.   PT/ OT evaluation. Out of bed to chair. Incentive spirometry. Nursing supportive care. Fall, aspiration precautions. DVT prophylaxis   Code Status: Full Code  Subjective: Patient is seen and examined today.  He is more alert and awake. Back to baseline per DSS social worker at bedside. He is eating better. Sugars improved.  Physical Exam: Vitals:   04/15/23 0800 04/15/23 0900 04/15/23 1000 04/15/23 1100  BP: (!) 140/91 (!) 142/86 (!) 137/102 (!) 150/127  Pulse: 91 92 95 97  Resp: 18 17 (!) 25 (!) 24  Temp:      TempSrc:      SpO2: 100% 100% 100% 100%  Weight:      Height:        General - Middle aged Caucasian male, no acute distress HEENT - PERRLA, EOMI, atraumatic head, non tender sinuses. Lung - Clear, diffuse rales, no rhonchi, wheezes. Heart - S1, S2 heard, no murmurs, rubs, trace pedal edema. Abdomen - Soft, non tender nondistended, bowel sounds good Neuro -alert, awake, able to follow commands, has speech difficulty at baseline. Skin - Warm and dry.  Data Reviewed:      Latest Ref Rng & Units 04/15/2023    5:08 AM 04/14/2023    6:09 AM 04/13/2023    5:58 AM  CBC  WBC 4.0 - 10.5 K/uL 6.6  3.2  2.4   Hemoglobin 13.0 - 17.0 g/dL 81.1  9.8  9.3   Hematocrit 39.0 - 52.0 % 33.4  29.1  26.9   Platelets 150 - 400 K/uL 28  24  21       Latest Ref Rng & Units 04/15/2023    5:08 AM 04/14/2023    1:48 AM 04/13/2023    5:58 AM  BMP  Glucose 70 - 99 mg/dL 161  096  045   BUN 6 - 20 mg/dL 37  45  59   Creatinine 0.61 - 1.24 mg/dL 4.09  8.11  9.14   Sodium 135 - 145 mmol/L 136  137  137   Potassium 3.5 - 5.1 mmol/L 3.6  4.2  4.5   Chloride 98 - 111 mmol/L 95  94  97   CO2  22 - 32 mmol/L 30  29  31    Calcium 8.9 - 10.3 mg/dL 9.3  9.1  8.7    EEG adult  Result Date: 04/13/2023 Rejeana Brock, MD     04/13/2023  3:52 PM History: 52 year old presenting with encephalopathy Sedation: None Technique: This EEG was acquired with electrodes placed according to the International 10-20 electrode system (including Fp1, Fp2, F3, F4, C3, C4, P3, P4, O1, O2, T3, T4, T5, T6, A1, A2, Fz, Cz, Pz). The following electrodes were missing or displaced: none. Background: The background is dominated by low voltage generalized irregular slow activity, predominantly delta range with superimposed 5-6 hz theta.  With stimulation, there is briefly seen posterior dominant rhythm achieving a frequency of 7 to 8 Hz, but this is poorly sustained. Photic stimulation: Physiologic driving is not performed EEG Abnormalities: 1) generalized irregular slow activity Clinical Interpretation: This EEG is consistent with a generalized nonspecific cerebral dysfunction (encephalopathy). There was no seizure or seizure predisposition recorded on this study. Please note that lack of epileptiform activity on EEG does not preclude the possibility of epilepsy. Ritta Slot, MD [facility] [phone] If 7pm- 7am, please page neurology on call as listed in [facility].    Family Communication:  DSS Child psychotherapist at bedside updated.   Disposition: Status is: Inpatient Remains inpatient appropriate because: lethargy, pancytopenia, acute kidney injury work up and monitoring.  Planned Discharge Destination:  Group home     Time spent: 38 minutes  Author: Marcelino Duster, MD 04/15/2023 11:33 AM Secure chat 7am to 7pm For on call review [email or web address].

## 2023-04-16 DIAGNOSIS — G9341 Metabolic encephalopathy: Secondary | ICD-10-CM | POA: Diagnosis not present

## 2023-04-16 DIAGNOSIS — E162 Hypoglycemia, unspecified: Secondary | ICD-10-CM | POA: Diagnosis not present

## 2023-04-16 DIAGNOSIS — N179 Acute kidney failure, unspecified: Secondary | ICD-10-CM | POA: Diagnosis not present

## 2023-04-16 DIAGNOSIS — D61818 Other pancytopenia: Secondary | ICD-10-CM | POA: Diagnosis not present

## 2023-04-16 LAB — BASIC METABOLIC PANEL
Anion gap: 10 (ref 5–15)
BUN: 34 mg/dL — ABNORMAL HIGH (ref 6–20)
CO2: 29 mmol/L (ref 22–32)
Calcium: 9.3 mg/dL (ref 8.9–10.3)
Chloride: 97 mmol/L — ABNORMAL LOW (ref 98–111)
Creatinine, Ser: 1.68 mg/dL — ABNORMAL HIGH (ref 0.61–1.24)
GFR, Estimated: 49 mL/min — ABNORMAL LOW (ref >60)
Glucose, Bld: 146 mg/dL — ABNORMAL HIGH (ref 70–99)
Potassium: 3.3 mmol/L — ABNORMAL LOW (ref 3.5–5.1)
Sodium: 136 mmol/L (ref 135–145)

## 2023-04-16 LAB — CBC
HCT: 35.9 % — ABNORMAL LOW (ref 39.0–52.0)
Hemoglobin: 12.3 g/dL — ABNORMAL LOW (ref 13.0–17.0)
MCH: 27.9 pg (ref 26.0–34.0)
MCHC: 34.3 g/dL (ref 30.0–36.0)
MCV: 81.4 fL (ref 80.0–100.0)
Platelets: 35 10*3/uL — ABNORMAL LOW (ref 150–400)
RBC: 4.41 MIL/uL (ref 4.22–5.81)
RDW: 13.2 % (ref 11.5–15.5)
WBC: 10.1 10*3/uL (ref 4.0–10.5)
nRBC: 0 % (ref 0.0–0.2)

## 2023-04-16 LAB — GLUCOSE, CAPILLARY
Glucose-Capillary: 143 mg/dL — ABNORMAL HIGH (ref 70–99)
Glucose-Capillary: 184 mg/dL — ABNORMAL HIGH (ref 70–99)
Glucose-Capillary: 212 mg/dL — ABNORMAL HIGH (ref 70–99)
Glucose-Capillary: 351 mg/dL — ABNORMAL HIGH (ref 70–99)
Glucose-Capillary: 231 mg/dL — ABNORMAL HIGH (ref 70–99)

## 2023-04-16 MED ORDER — POTASSIUM CHLORIDE 20 MEQ PO PACK
40.0000 meq | PACK | Freq: Once | ORAL | Status: AC
  Administered 2023-04-16: 40 meq via ORAL
  Filled 2023-04-16: qty 2

## 2023-04-16 MED ORDER — METOPROLOL TARTRATE 25 MG PO TABS
25.0000 mg | ORAL_TABLET | Freq: Two times a day (BID) | ORAL | Status: DC
  Administered 2023-04-16 – 2023-04-18 (×4): 25 mg via ORAL
  Filled 2023-04-16 (×4): qty 1

## 2023-04-16 NOTE — Progress Notes (Signed)
Report called to Lacie Draft, receiving nurse for room 129.

## 2023-04-16 NOTE — Progress Notes (Signed)
Had episode of vomiting x1, states he thinks it is all the peanut butter he ate. Suggested he not anything else until breakfast. Cleaned patient of vomit and changed gown and sheets, administered dose of zofran.

## 2023-04-16 NOTE — Progress Notes (Signed)
Progress Note   Patient: Derrick Atkins BJY:[id number] DOB: 09/20/1970 DOA: 04/12/2023     4 DOS: the patient was seen and examined on 04/16/2023   Brief hospital course: Derrick Atkins is a 52 year old male with history of cerebral palsy, depression, non-insulin-dependent diabetes mellitus, hypertension, schizoaffective disorder, scoliosis, who presents emergency department for chief concerns of nausea, vomiting, shortness of breath. Patient's blood sugars found to be very low at 35, renal function worsening 3.6, platelet count low at 35 admitted to hospitalist service for further management evaluation of altered mental status, hypoglycemia, acute kidney injury.  Assessment and Plan: * Hypoglycemia - resolved. States he is eating poor at the facility. Blood sugars improved now. Eating better. Stop IV fluids. A1c.  5.1 Continue to monitor blood sugars ACHS.  Metabolic encephalopathy - resolved Multifactorial- initial presentation with very low sugars, acute kidney injury, high ammonia level and he is on schizophrenia medications.  CT head unremarkable. He is still lethargic. Stopped Depakote as it can increase ammonia level.   Zyprexa, cogentin held given his lethargy. Will resume if mental status better. Lactulose 3 times daily, titrate to 2-3 bowel movements. Continue neurochecks. Fall, aspiration precautions.  AKI (acute kidney injury) (HCC) Suspect secondary to prerenal in setting of nausea and vomiting and poor p.o. intake. He has good urine output today. Kidney function improving. No urinary retention. Avoid nephrotoxic drugs. Monitor daily renal function.  Essential hypertension IV Hydralazine 5 mg every 6 hours as needed for SBP greater 165. Started metoprolol 25 bid.  Pancytytopenia Thrombocytopenia (HCC) Platelets around 35.  WBC, Hb improved. He has h/o ETOH, Depakote stopped cause of thrombocytopenia. Continue to monitor CBC daily.  Hypothyroidism Continue  Levothyroxine 88 mcg daily   Dyslipidemia Continue Rosuvastatin, Lopid.  Schizophrenia, paranoid, chronic (HCC) With history of schizoaffective disorder Depakote held due to adverse effects, allergy list updated. Restarted Zyprexa, Cogentin. He has ambulatory dysfunction, balance issues. PT evaluation for safe discharge plan.   PT/ OT evaluation. Out of bed to chair. Incentive spirometry. Nursing supportive care. Fall, aspiration precautions. DVT prophylaxis   Code Status: Full Code  Subjective: Patient is seen and examined today.  He is more alert and awake. He is eating better. Sugars improved. Overnight he was asking to return to group home.  Physical Exam: Vitals:   04/16/23 1100 04/16/23 1200 04/16/23 1300 04/16/23 1400  BP: 128/79 137/77 138/76 (!) 156/89  Pulse: (!) 106 (!) 108 (!) 102 (!) 109  Resp: 18 (!) 24 (!) 21 (!) 21  Temp:      TempSrc:      SpO2: 100% 100% 95% 100%  Weight:      Height:        General - Middle aged Caucasian male, no acute distress HEENT - PERRLA, EOMI, atraumatic head, non tender sinuses. Lung - Clear, diffuse rales, no rhonchi, wheezes. Heart - S1, S2 heard, no murmurs, rubs, trace pedal edema. Abdomen - Soft, non tender nondistended, bowel sounds good Neuro -alert, awake, able to follow commands, has speech difficulty at baseline. Skin - Warm and dry.  Data Reviewed:      Latest Ref Rng & Units 04/16/2023    3:14 AM 04/15/2023    5:08 AM 04/14/2023    6:09 AM  CBC  WBC 4.0 - 10.5 K/uL 10.1  6.6  3.2   Hemoglobin 13.0 - 17.0 g/dL 08.6  57.8  9.8   Hematocrit 39.0 - 52.0 % 35.9  33.4  29.1   Platelets 150 - 400  K/uL 35  28  24       Latest Ref Rng & Units 04/16/2023    3:14 AM 04/15/2023    5:08 AM 04/14/2023    1:48 AM  BMP  Glucose 70 - 99 mg/dL 161  096  045   BUN 6 - 20 mg/dL 34  37  45   Creatinine 0.61 - 1.24 mg/dL 4.09  8.11  9.14   Sodium 135 - 145 mmol/L 136  136  137   Potassium 3.5 - 5.1 mmol/L 3.3  3.6  4.2    Chloride 98 - 111 mmol/L 97  95  94   CO2 22 - 32 mmol/L 29  30  29    Calcium 8.9 - 10.3 mg/dL 9.3  9.3  9.1    No results found.   Family Communication:  group home, DSS updated.   Disposition: Status is: Inpatient Remains inpatient appropriate because: kidney function, cbc monitoring. PT evaluation  Planned Discharge Destination:  Group home     Time spent: 38 minutes  Author: Marcelino Duster, MD 04/16/2023 3:13 PM Secure chat 7am to 7pm For on call review [email or web address].

## 2023-04-16 NOTE — Plan of Care (Signed)

## 2023-04-16 NOTE — Plan of Care (Signed)

## 2023-04-16 NOTE — Progress Notes (Signed)
Has asked numerous times throughout the night if he can go back to his group home. States he does not want to be here anymore.

## 2023-04-16 NOTE — Evaluation (Signed)
Physical Therapy Evaluation Patient Details Name: Derrick Atkins MRN: [id number] DOB: 1971/05/25 Today's Date: 04/16/2023  History of Present Illness  Derrick Atkins is a 52 year old male with history of cerebral palsy, depression, non-insulin-dependent diabetes mellitus, hypertension, schizoaffective disorder, scoliosis, who presents emergency department for chief concerns of nausea, vomiting, shortness of breath. Patient's blood sugars found to be very low at 35 and admitted for altered mental status, hypoglycemia, acute kidney injury.   Clinical Impression  Pt alert, agreeable to PT with time. Noted to follow one step commands with extended time and sometimes with repetition. Pt reported that he lives at group home, has multiple falls (unable to quantify how many in the last month) denied use of AD.   He was able to perform supine <> sit with minA and cues for initiation/technique. Fair sitting balance. Sit <> stand with RW and minA. He was able to take a few steps but exhibited unsteadiness as well as posterior lean needing modA to correct. Pt also reported fatigue quickly. Returned to supine, and pt assisted with lunch set up.  Overall the patient demonstrated deficits (see "PT Problem List") that impede the patient's functional abilities, safety, and mobility and would benefit from skilled PT intervention.        If plan is discharge home, recommend the following: A lot of help with walking and/or transfers;A lot of help with bathing/dressing/bathroom;Assist for transportation;Assistance with cooking/housework;Help with stairs or ramp for entrance;Direct supervision/assist for medications management   Can travel by private vehicle   No    Equipment Recommendations Other (comment) (TBD at next venue of care)  Recommendations for Other Services       Functional Status Assessment Patient has had a recent decline in their functional status and demonstrates the ability to make  significant improvements in function in a reasonable and predictable amount of time.     Precautions / Restrictions Precautions Precautions: Fall Restrictions Weight Bearing Restrictions: No      Mobility  Bed Mobility Overal bed mobility: Needs Assistance Bed Mobility: Supine to Sit, Sit to Supine     Supine to sit: Min assist Sit to supine: Min assist   General bed mobility comments: cues to initiate and sequence    Transfers Overall transfer level: Needs assistance Equipment used: Rolling walker (2 wheels) Transfers: Sit to/from Stand Sit to Stand: Min assist                Ambulation/Gait Ambulation/Gait assistance: Min Chemical engineer (Feet): 3 Feet Assistive device: Rolling walker (2 wheels)         General Gait Details: posterior lean and general unsteadiness noted. pt reported fatigue quickly  Stairs            Wheelchair Mobility     Tilt Bed    Modified Rankin (Stroke Patients Only)       Balance Overall balance assessment: Needs assistance Sitting-balance support: No upper extremity supported, Feet supported Sitting balance-Leahy Scale: Fair     Standing balance support: Bilateral upper extremity supported Standing balance-Leahy Scale: Poor                               Pertinent Vitals/Pain Pain Assessment Pain Assessment: Faces Faces Pain Scale: Hurts little more Pain Location: back, with mobility Pain Descriptors / Indicators: Aching, Grimacing Pain Intervention(s): Limited activity within patient's tolerance, Monitored during session, Repositioned    Home Living   Living Arrangements: Group Home  Type of Home: Group Home Home Access: Level entry       Home Layout: One level Home Equipment: Agricultural consultant (2 wheels);Grab bars - tub/shower Additional Comments: pt able to provide some details such as multiple falls at home, no use of AD    Prior Function Prior Level of Function : Needs  assist;History of Falls (last six months)             Mobility Comments: endorses multiple falls >10 in the last month       Extremity/Trunk Assessment   Upper Extremity Assessment Upper Extremity Assessment: Defer to OT evaluation    Lower Extremity Assessment Lower Extremity Assessment: Generalized weakness (able to lift BLE against gravity, LLE easier than RLE)       Communication   Communication Communication: Difficulty communicating thoughts/reduced clarity of speech;Difficulty following commands/understanding Following commands: Follows one step commands consistently;Follows one step commands with increased time  Cognition Arousal: Alert Behavior During Therapy: Flat affect Overall Cognitive Status: History of cognitive impairments - at baseline                                          General Comments      Exercises     Assessment/Plan    PT Assessment Patient needs continued PT services  PT Problem List Decreased strength;Pain;Decreased range of motion;Decreased activity tolerance;Decreased balance;Decreased mobility;Decreased safety awareness;Decreased knowledge of use of DME       PT Treatment Interventions DME instruction;Neuromuscular re-education;Gait training;Stair training;Patient/family education;Functional mobility training;Therapeutic activities;Therapeutic exercise;Balance training    PT Goals (Current goals can be found in the Care Plan section)  Acute Rehab PT Goals Patient Stated Goal: to be stronger PT Goal Formulation: With patient Time For Goal Achievement: 04/30/23 Potential to Achieve Goals: Good    Frequency Min 1X/week     Co-evaluation               AM-PAC PT "6 Clicks" Mobility  Outcome Measure Help needed turning from your back to your side while in a flat bed without using bedrails?: A Little Help needed moving from lying on your back to sitting on the side of a flat bed without using bedrails?: A  Little Help needed moving to and from a bed to a chair (including a wheelchair)?: A Little Help needed standing up from a chair using your arms (e.g., wheelchair or bedside chair)?: A Little Help needed to walk in hospital room?: A Lot Help needed climbing 3-5 steps with a railing? : A Lot 6 Click Score: 16    End of Session Equipment Utilized During Treatment: Gait belt Activity Tolerance: Patient tolerated treatment well Patient left: in bed;with call bell/phone within reach;with bed alarm set Nurse Communication: Mobility status PT Visit Diagnosis: Other abnormalities of gait and mobility (R26.89);Muscle weakness (generalized) (M62.81);Repeated falls (R29.6);Difficulty in walking, not elsewhere classified (R26.2)    Time: 4098-1191 PT Time Calculation (min) (ACUTE ONLY): 16 min   Charges:   PT Evaluation $PT Eval Low Complexity: 1 Low PT Treatments $Therapeutic Activity: 8-22 mins PT General Charges $$ ACUTE PT VISIT: 1 Visit        Olga Coaster PT, DPT 2:22 PM,04/16/23

## 2023-04-16 NOTE — TOC Progression Note (Signed)
Transition of Care [facility]) - Progression Note    Patient Details  Name: Derrick Atkins MRN: [id number] Date of Birth: 03-28-1971  [facility]) CM/SW Contact  Colette Ribas, [place] Phone Number: 04/16/2023, 1:20 PM  Clinical Narrative:    CSW spoke with Siri Cole [phone]. She confirmed patient can return at DC if he is capable of ambulating on his own. I advised we are awaiting PT updates.   Expected Discharge Plan: Group Home (with home health)    Expected Discharge Plan and Services                                               Social Determinants of Health (SDOH) Interventions SDOH Screenings   Food Insecurity: No Food Insecurity (04/14/2023)  Housing: Low Risk  (04/14/2023)  Transportation Needs: Unmet Transportation Needs (04/14/2023)  Utilities: Not At Risk (04/14/2023)  Alcohol Screen: Low Risk  (03/23/2022)  Tobacco Use: Low Risk  (04/14/2023)    Readmission Risk Interventions    04/15/2023   11:03 AM  Readmission Risk Prevention Plan  Transportation Screening Complete  HRI or Home Care Consult Complete  Social Work Consult for Recovery Care Planning/Counseling Complete  Medication Review Oceanographer) Complete

## 2023-04-17 DIAGNOSIS — D61818 Other pancytopenia: Secondary | ICD-10-CM | POA: Diagnosis not present

## 2023-04-17 DIAGNOSIS — E162 Hypoglycemia, unspecified: Secondary | ICD-10-CM | POA: Diagnosis not present

## 2023-04-17 DIAGNOSIS — G9341 Metabolic encephalopathy: Secondary | ICD-10-CM | POA: Diagnosis not present

## 2023-04-17 DIAGNOSIS — N179 Acute kidney failure, unspecified: Secondary | ICD-10-CM | POA: Diagnosis not present

## 2023-04-17 LAB — CBC
HCT: 29.8 % — ABNORMAL LOW (ref 39.0–52.0)
Hemoglobin: 10.4 g/dL — ABNORMAL LOW (ref 13.0–17.0)
MCH: 28 pg (ref 26.0–34.0)
MCHC: 34.9 g/dL (ref 30.0–36.0)
MCV: 80.1 fL (ref 80.0–100.0)
Platelets: 40 10*3/uL — ABNORMAL LOW (ref 150–400)
RBC: 3.72 MIL/uL — ABNORMAL LOW (ref 4.22–5.81)
RDW: 13.2 % (ref 11.5–15.5)
WBC: 11.1 10*3/uL — ABNORMAL HIGH (ref 4.0–10.5)
nRBC: 0 % (ref 0.0–0.2)

## 2023-04-17 LAB — BASIC METABOLIC PANEL
Anion gap: 10 (ref 5–15)
BUN: 34 mg/dL — ABNORMAL HIGH (ref 6–20)
CO2: 23 mmol/L (ref 22–32)
Calcium: 9 mg/dL (ref 8.9–10.3)
Chloride: 101 mmol/L (ref 98–111)
Creatinine, Ser: 1.33 mg/dL — ABNORMAL HIGH (ref 0.61–1.24)
GFR, Estimated: >60 mL/min (ref >60)
Glucose, Bld: 199 mg/dL — ABNORMAL HIGH (ref 70–99)
Potassium: 3.5 mmol/L (ref 3.5–5.1)
Sodium: 134 mmol/L — ABNORMAL LOW (ref 135–145)

## 2023-04-17 LAB — GLUCOSE, CAPILLARY
Glucose-Capillary: 130 mg/dL — ABNORMAL HIGH (ref 70–99)
Glucose-Capillary: 157 mg/dL — ABNORMAL HIGH (ref 70–99)
Glucose-Capillary: 213 mg/dL — ABNORMAL HIGH (ref 70–99)
Glucose-Capillary: 199 mg/dL — ABNORMAL HIGH (ref 70–99)
Glucose-Capillary: 205 mg/dL — ABNORMAL HIGH (ref 70–99)
Glucose-Capillary: 205 mg/dL — ABNORMAL HIGH (ref 70–99)

## 2023-04-17 MED ORDER — INSULIN ASPART 100 UNIT/ML IJ SOLN
0.0000 [IU] | Freq: Three times a day (TID) | INTRAMUSCULAR | Status: DC
  Administered 2023-04-17: 1 [IU] via SUBCUTANEOUS
  Administered 2023-04-18: 2 [IU] via SUBCUTANEOUS
  Filled 2023-04-17 (×2): qty 1

## 2023-04-17 MED ORDER — INSULIN ASPART 100 UNIT/ML IJ SOLN
0.0000 [IU] | Freq: Every day | INTRAMUSCULAR | Status: DC
  Administered 2023-04-17: 2 [IU] via SUBCUTANEOUS
  Filled 2023-04-17: qty 1

## 2023-04-17 NOTE — Progress Notes (Signed)
Progress Note   Patient: Derrick Atkins XBJ:[id number] DOB: Dec 03, 1970 DOA: 04/12/2023     5 DOS: the patient was seen and examined on 04/17/2023   Brief hospital course: Mr. Braydn Carneiro is a 52 year old male with history of cerebral palsy, depression, non-insulin-dependent diabetes mellitus, hypertension, schizoaffective disorder, scoliosis, who presents emergency department for chief concerns of nausea, vomiting, shortness of breath. Patient's blood sugars found to be very low at 35, renal function worsening 3.6, platelet count low at 35 admitted to hospitalist service for further management evaluation of altered mental status, hypoglycemia, acute kidney injury.  Assessment and Plan: * Hypoglycemia - resolved. States he is eating poor at the facility. Blood sugars improved now. Eating better. Stopped IV fluids. A1c.  5.1. Blood sugars around 180. Diet changed to carb consistent Continue to monitor blood sugars ACHS. If persistently elevated above 200 will start sliding scale.  Metabolic encephalopathy - resolved Multifactorial- initial presentation with very low sugars, acute kidney injury, high ammonia level and he is on schizophrenia medications.  CT head unremarkable. He is still lethargic. Stopped Depakote as it can increase ammonia level.   Zyprexa, cogentin held given his lethargy. Will resume if mental status better. Lactulose 3 times daily, titrate to 2-3 bowel movements. Continue neurochecks. Fall, aspiration precautions.  AKI (acute kidney injury) (HCC) Suspect secondary to prerenal in setting of nausea and vomiting and poor p.o. intake. He has good urine output today. Kidney function improving. No urinary retention. Avoid nephrotoxic drugs. Monitor daily renal function.  Essential hypertension IV Hydralazine 5 mg every 6 hours as needed for SBP greater 165. Started metoprolol 25 bid.  Pancytytopenia Thrombocytopenia (HCC) Platelets around 40.  WBC, Hb  improved. He has h/o ETOH, Depakote stopped cause of thrombocytopenia. Continue to monitor CBC daily.  Hypothyroidism Continue Levothyroxine 88 mcg daily   Dyslipidemia Continue Rosuvastatin, Lopid.  Schizophrenia, paranoid, chronic (HCC) With history of schizoaffective disorder Depakote held due to adverse effects, allergy list updated. Restarted Zyprexa, Cogentin.   PT advised SNF placement. Can't go back to group home.  Discussed with TOC, FL2 signed.   PT/ OT evaluation. Out of bed to chair. Incentive spirometry. Nursing supportive care. Fall, aspiration precautions. DVT prophylaxis   Code Status: Full Code  Subjective: Patient is seen and examined today.  He is sleeping arousable. Asked if he can go back to group home. Asks for ice cream, advised to avoid. He walked 3 feet, will need SNF placement.   Physical Exam: Vitals:   04/16/23 1930 04/16/23 2000 04/17/23 0434 04/17/23 0832  BP:  (!) 153/68 118/70 115/78  Pulse:  81 91 100  Resp:  (!) 22 18 17   Temp: 97.9 F (36.6 C)  98 F (36.7 C) 98.1 F (36.7 C)  TempSrc: Oral   Oral  SpO2:  100% 100% 100%  Weight:      Height:        General - Middle aged Caucasian male, no acute distress HEENT - PERRLA, EOMI, atraumatic head, non tender sinuses. Lung - Clear, diffuse rales, no rhonchi, wheezes. Heart - S1, S2 heard, no murmurs, rubs, trace pedal edema. Abdomen - Soft, non tender nondistended, bowel sounds good Neuro - sleeping, arousable, able to follow commands, has speech difficulty at baseline. Skin - Warm and dry.  Data Reviewed:      Latest Ref Rng & Units 04/17/2023    3:56 AM 04/16/2023    3:14 AM 04/15/2023    5:08 AM  CBC  WBC 4.0 - 10.5  K/uL 11.1  10.1  6.6   Hemoglobin 13.0 - 17.0 g/dL 32.4  40.1  02.7   Hematocrit 39.0 - 52.0 % 29.8  35.9  33.4   Platelets 150 - 400 K/uL 40  35  28       Latest Ref Rng & Units 04/17/2023    3:56 AM 04/16/2023    3:14 AM 04/15/2023    5:08 AM  BMP  Glucose  70 - 99 mg/dL 253  664  403   BUN 6 - 20 mg/dL 34  34  37   Creatinine 0.61 - 1.24 mg/dL 4.74  2.59  5.63   Sodium 135 - 145 mmol/L 134  136  136   Potassium 3.5 - 5.1 mmol/L 3.5  3.3  3.6   Chloride 98 - 111 mmol/L 101  97  95   CO2 22 - 32 mmol/L 23  29  30    Calcium 8.9 - 10.3 mg/dL 9.0  9.3  9.3    No results found.   Family Communication:  group home, DSS updated.   Disposition: Status is: Inpatient Remains inpatient appropriate because: SNF placement  Planned Discharge Destination: Skilled nursing facility     Time spent: 37 minutes  Author: Marcelino Duster, MD 04/17/2023 12:22 PM Secure chat 7am to 7pm For on call review [email or web address].

## 2023-04-17 NOTE — TOC Progression Note (Addendum)
Transition of Care [facility]) - Progression Note    Patient Details  Name: Derrick Atkins MRN: [id number] Date of Birth: 1970-07-16  Transition of Care [facility]) CM/SW Contact  [name], LCSW Phone Number: 04/17/2023, 10:06 AM  Clinical Narrative:    PT is recommending SNF. Called DSS Guardian Candace - Candace states she agrees with SNF rec. She prefers [facility] if available as patient has been there recently. SNF workup started. PASRR is pending. Additional information uploaded as requested.   Expected Discharge Plan: Group Home (with home health)    Expected Discharge Plan and Services                                               Social Determinants of Health (SDOH) Interventions SDOH Screenings   Food Insecurity: No Food Insecurity (04/14/2023)  Housing: Low Risk  (04/14/2023)  Transportation Needs: Unmet Transportation Needs (04/14/2023)  Utilities: Not At Risk (04/14/2023)  Alcohol Screen: Low Risk  (03/23/2022)  Tobacco Use: Low Risk  (04/14/2023)    Readmission Risk Interventions    04/15/2023   11:03 AM  Readmission Risk Prevention Plan  Transportation Screening Complete  HRI or Home Care Consult Complete  Social Work Consult for Recovery Care Planning/Counseling Complete  Medication Review Oceanographer) Complete

## 2023-04-17 NOTE — Progress Notes (Signed)
Mobility Specialist - Progress Note     04/17/23 1222  Mobility  Activity Ambulated with assistance in room  Level of Assistance Minimal assist, patient does 75% or more  Assistive Device Front wheel walker  Distance Ambulated (ft) 6 ft  Range of Motion/Exercises Active  Activity Response Tolerated well  Mobility Referral Yes  $Mobility charge 1 Mobility  Mobility Specialist Start Time (ACUTE ONLY) 1200  Mobility Specialist Stop Time (ACUTE ONLY) 1225  Mobility Specialist Time Calculation (min) (ACUTE ONLY) 25 min   Pt resting in bed on RA upon entry. Pt STS MinA and ambulates in the room CGA. Pt performs assisted pericare in standing with RW. Pt returned to recliner and left with needs in reach. Chair alarm activated.   Johnathan Hausen Mobility Specialist 04/17/23, 1:43 PM

## 2023-04-17 NOTE — [place] FL2 (Signed)
[place] MEDICAID FL2 LEVEL OF CARE FORM     IDENTIFICATION  Patient Name: Derrick Atkins Birthdate: October 16, 1970 Sex: male Admission Date (Current Location): 04/12/2023  [facility] and [place] Number:  Chiropodist and Address:  [facility], [address] [place], [place] [address]      Provider Number: [id number]  Attending Physician Name and Address:  Marcelino Duster, MD  Relative Name and Phone Number:  Lenn Sink (Legal Guardian)  [phone] (Home Phone)    Current Level of Care: Hospital Recommended Level of Care: Skilled Nursing Facility Prior Approval Number:    Date Approved/Denied:   PASRR Number: PENDING**  Discharge Plan:      Current Diagnoses: Patient Active Problem List   Diagnosis Date Noted   Altered mental status 04/12/2023   Leg swelling 04/12/2023   Ambulatory dysfunction 02/26/2023   DM2 (diabetes mellitus, type 2) (HCC) 02/26/2023   Hyperlipidemia 02/26/2023   Prolonged QT interval 02/26/2023   Normocytic anemia 02/26/2023   Thrombocytopenia (HCC) 02/21/2023   Rhabdomyolysis 02/21/2023   Hypoglycemia 02/21/2023   Abnormal LFTs 02/21/2023   Fall at home, initial encounter 02/21/2023   Cellulitis of left lower extremity 01/06/2023   Iron deficiency anemia 01/06/2023   Cellulitis and abscess of left lower extremity 01/06/2023   Nausea & vomiting 01/06/2023   AKI (acute kidney injury) (HCC) 12/13/2022   Dyslipidemia 12/13/2022   Schizophrenia (HCC) 03/23/2022   Aggressive behavior    Schizophrenia, paranoid, chronic (HCC)    Long QT interval 04/16/2014   Hypothyroidism 12/25/2013   Essential hypertension 05/03/2007   Type 2 diabetes mellitus without complications (HCC) 05/03/2007    Orientation RESPIRATION BLADDER Height & Weight     Self, Time, Situation, Place  Normal Incontinent Weight: 198 lb 10.2 oz (90.1 kg) Height:  6\' 3"  (190.5 cm)  BEHAVIORAL SYMPTOMS/MOOD NEUROLOGICAL BOWEL  NUTRITION STATUS      Continent Diet (regular)  AMBULATORY STATUS COMMUNICATION OF NEEDS Skin   Limited Assist Verbally Bruising, Other (Comment) (stage 2 sacrum)                       Personal Care Assistance Level of Assistance  Bathing, Feeding, Dressing Bathing Assistance: Limited assistance Feeding assistance: Limited assistance Dressing Assistance: Limited assistance     Functional Limitations Info             SPECIAL CARE FACTORS FREQUENCY  PT (By licensed PT), OT (By licensed OT)     PT Frequency: 5 times per week OT Frequency: 5 times per week            Contractures      Additional Factors Info  Code Status, Allergies Code Status Info: full Allergies Info: Valproic Acid And Related, Phenytoin Sodium Extended, Prednisone, Latex           Current Medications (04/17/2023):  This is the current hospital active medication list Current Facility-Administered Medications  Medication Dose Route Frequency Provider Last Rate Last Admin   acetaminophen (TYLENOL) tablet 650 mg  650 mg Oral Q6H PRN Cox, Amy N, DO   650 mg at 04/16/23 0902   Or   acetaminophen (TYLENOL) suppository 650 mg  650 mg Rectal Q6H PRN Cox, Amy N, DO       albuterol (PROVENTIL) (2.5 MG/3ML) 0.083% nebulizer solution 3 mL  3 mL Inhalation Q4H PRN Marcelino Duster, MD       alum & mag hydroxide-simeth (MAALOX/MYLANTA) 200-200-20 MG/5ML suspension 30 mL  30  mL Oral Q4H PRN Marcelino Duster, MD       ascorbic acid (VITAMIN C) tablet 500 mg  500 mg Oral Daily Cox, Amy N, DO   500 mg at 04/17/23 0946   aspirin EC tablet 81 mg  81 mg Oral Daily Marcelino Duster, MD   81 mg at 04/17/23 0946   benztropine (COGENTIN) tablet 1 mg  1 mg Oral Daily Marcelino Duster, MD   1 mg at 04/17/23 0946   Chlorhexidine Gluconate Cloth 2 % PADS 6 each  6 each Topical Q2200 Marcelino Duster, MD   6 each at 04/16/23 2309   cholecalciferol (VITAMIN D3) 25 MCG (1000 UNIT) tablet 1,000 Units   1,000 Units Oral Daily Cox, Amy N, DO   1,000 Units at 04/17/23 0946   folic acid (FOLVITE) tablet 1 mg  1 mg Oral Daily Marcelino Duster, MD   1 mg at 04/17/23 0946   gemfibrozil (LOPID) tablet 600 mg  600 mg Oral BID AC Sreeram, Lynne Logan, MD   600 mg at 04/17/23 0946   iron polysaccharides (NIFEREX) capsule 150 mg  150 mg Oral Daily Marcelino Duster, MD   150 mg at 04/17/23 0946   levothyroxine (SYNTHROID) tablet 88 mcg  88 mcg Oral Q0600 Cox, Amy N, DO   88 mcg at 04/17/23 0549   metoprolol tartrate (LOPRESSOR) tablet 25 mg  25 mg Oral BID Marcelino Duster, MD   25 mg at 04/17/23 0946   OLANZapine (ZYPREXA) tablet 30 mg  30 mg Oral QHS Marcelino Duster, MD   30 mg at 04/16/23 2308   ondansetron (ZOFRAN) tablet 4 mg  4 mg Oral Q6H PRN Cox, Amy N, DO       Or   ondansetron (ZOFRAN) injection 4 mg  4 mg Intravenous Q6H PRN Cox, Amy N, DO   4 mg at 04/16/23 0458   rosuvastatin (CRESTOR) tablet 5 mg  5 mg Oral Daily Cox, Amy N, DO   5 mg at 04/17/23 0946   senna (SENOKOT) tablet 17.2 mg  2 tablet Oral QHS Marcelino Duster, MD   17.2 mg at 04/15/23 2134   senna-docusate (Senokot-S) tablet 1 tablet  1 tablet Oral QHS PRN Cox, Amy N, DO   1 tablet at 04/14/23 2158     Discharge Medications: Please see discharge summary for a list of discharge medications.  Relevant Imaging Results:  Relevant Lab Results:   Additional Information SS #: [id number]  Tej Murdaugh E Zaylei Mullane, LCSW

## 2023-04-17 NOTE — TOC PASRR Note (Signed)
RE: Derrick Atkins. [place]ie[place] Date of Birth:  07-29-1970 Date:04/17/2023   To Whom It May Concern:  Please be advised that the above-named patient will require a short-term nursing home stay - anticipated 30 days or less for rehabilitation and strengthening.  The plan is for return home.

## 2023-04-17 NOTE — Plan of Care (Signed)

## 2023-04-17 NOTE — TOC Progression Note (Signed)
Transition of Care [facility]) - Progression Note    Patient Details  Name: Derrick Atkins MRN: [id number] Date of Birth: [birth year]  Transition [facility] Care [facility]) CM/SW Contact  [name], LCSW Phone Number: 04/17/2023, 4:25 PM  Clinical Narrative:    Notified by RN that patient's brother Derrick Atkins is requesting a call with an update.  Called and spoke with Derrick Atkins with DSS who confirms its ok to update Derrick Atkins, but he cannot make any decisions for the patient as DSS is Legal Guardian. Derrick Atkins states Derrick Atkins is patient's assigned Guardianship SW. It is ok to contact Derrick Atkins or Derrick for patient questions/updates.   Called and provided update to [facility] regarding SNF search for short term rehab. Derrick Atkins states he has some questions and concerns about the patient and wants to be kept updated about the patient. CSW called back Derrick Atkins with DSS, left a message requesting Derrick Atkins or Derrick call Derrick Atkins when able.  Expected Discharge Plan: Group Home (with home health)    Expected Discharge Plan and Services                                               Social Determinants of Health (SDOH) Interventions SDOH Screenings   Food Insecurity: No Food Insecurity (04/14/2023)  Housing: Low Risk  (04/14/2023)  Transportation Needs: Unmet Transportation Needs (04/14/2023)  Utilities: Not At Risk (04/14/2023)  Alcohol Screen: Low Risk  (03/23/2022)  Tobacco Use: Low Risk  (04/14/2023)    Readmission Risk Interventions    04/15/2023   11:03 AM  Readmission Risk Prevention Plan  Transportation Screening Complete  HRI or Home Care Consult Complete  Social Work Consult for Recovery Care Planning/Counseling Complete  Medication Review Oceanographer) Complete

## 2023-04-17 NOTE — Plan of Care (Signed)
  Problem: Education: Goal: Knowledge of General Education information will improve Description: Including pain rating scale, medication(s)/side effects and non-pharmacologic comfort measures Outcome: Progressing   Problem: Clinical Measurements: Goal: Respiratory complications will improve Outcome: Progressing   Problem: Activity: Goal: Risk for activity intolerance will decrease Outcome: Progressing   

## 2023-04-18 DIAGNOSIS — F200 Paranoid schizophrenia: Secondary | ICD-10-CM | POA: Diagnosis not present

## 2023-04-18 DIAGNOSIS — E039 Hypothyroidism, unspecified: Secondary | ICD-10-CM | POA: Diagnosis not present

## 2023-04-18 DIAGNOSIS — E162 Hypoglycemia, unspecified: Secondary | ICD-10-CM | POA: Diagnosis not present

## 2023-04-18 DIAGNOSIS — D696 Thrombocytopenia, unspecified: Secondary | ICD-10-CM | POA: Diagnosis not present

## 2023-04-18 LAB — GLUCOSE, CAPILLARY
Glucose-Capillary: 126 mg/dL — ABNORMAL HIGH (ref 70–99)
Glucose-Capillary: 130 mg/dL — ABNORMAL HIGH (ref 70–99)
Glucose-Capillary: 221 mg/dL — ABNORMAL HIGH (ref 70–99)

## 2023-04-18 LAB — BASIC METABOLIC PANEL
Anion gap: 6 (ref 5–15)
BUN: 32 mg/dL — ABNORMAL HIGH (ref 6–20)
CO2: 23 mmol/L (ref 22–32)
Calcium: 9.2 mg/dL (ref 8.9–10.3)
Chloride: 109 mmol/L (ref 98–111)
Creatinine, Ser: 1.24 mg/dL (ref 0.61–1.24)
GFR, Estimated: >60 mL/min (ref >60)
Glucose, Bld: 160 mg/dL — ABNORMAL HIGH (ref 70–99)
Potassium: 3.5 mmol/L (ref 3.5–5.1)
Sodium: 138 mmol/L (ref 135–145)

## 2023-04-18 LAB — CBC
HCT: 27.2 % — ABNORMAL LOW (ref 39.0–52.0)
Hemoglobin: 9.4 g/dL — ABNORMAL LOW (ref 13.0–17.0)
MCH: 27.6 pg (ref 26.0–34.0)
MCHC: 34.6 g/dL (ref 30.0–36.0)
MCV: 79.8 fL — ABNORMAL LOW (ref 80.0–100.0)
Platelets: 66 10*3/uL — ABNORMAL LOW (ref 150–400)
RBC: 3.41 MIL/uL — ABNORMAL LOW (ref 4.22–5.81)
RDW: 13.2 % (ref 11.5–15.5)
WBC: 9 10*3/uL (ref 4.0–10.5)
nRBC: 0 % (ref 0.0–0.2)

## 2023-04-18 MED ORDER — METOPROLOL TARTRATE 25 MG PO TABS: 25.0000 mg | ORAL_TABLET | Freq: Two times a day (BID) | ORAL | 2 refills | Status: DC

## 2023-04-18 NOTE — Discharge Summary (Addendum)
Physician Discharge Summary   Patient: Derrick Atkins MRN: [id number] DOB: 07/01/1971  Admit date:     04/12/2023  Discharge date: 04/18/23  Discharge Physician: Marcelino Duster   PCP: Koren Bound, NP   Recommendations at discharge:    PCP follow up in 1 week Psychiatry follow up as scheduled. Follow weekly CBC as he is on Olanzapine therapy  Discharge Diagnoses: Principal Problem:   Hypoglycemia Active Problems:   Altered mental status   AKI (acute kidney injury) (HCC)   Type 2 diabetes mellitus without complications (HCC)   Essential hypertension   Thrombocytopenia (HCC)   Hypothyroidism   Dyslipidemia   Schizophrenia, paranoid, chronic (HCC)   Schizophrenia (HCC)   DM2 (diabetes mellitus, type 2) (HCC)   Hyperlipidemia   Leg swelling  Resolved Problems:   * No resolved hospital problems. *  Hospital Course: [name] is a 52 year old male with history of cerebral palsy, depression, non-insulin-dependent diabetes mellitus, hypertension, schizoaffective disorder, scoliosis, who presents emergency department for chief concerns of nausea, vomiting, shortness of breath. Patient's blood sugars found to be very low at 35, renal function worsening 3.6, platelet count low at 35 admitted to hospitalist service for further management evaluation of altered mental status, hypoglycemia, acute kidney injury.   Assessment and Plan: * Hypoglycemia - resolved. States he is eating poor at the facility. Blood sugars improved now. Eating better. Stopped IV fluids. A1c 5.1 but due to him being on psych meds he is prone for hyperglycemia. Blood sugars 200s, encourage Carbohydrate consistent diet.  Metabolic encephalopathy- resolved Multifactorial- initial presentation with very low sugars, acute kidney injury, high ammonia level and he is on schizophrenia medications.  CT head unremarkable.  He is back to baseline mental status. Per neurology - stopped Depakote as it can  increase ammonia level.   Zyprexa, cogentin resumed. Continue neurochecks. Fall, aspiration precautions.   AKI (acute kidney injury) (HCC)- resolved Suspect secondary to prerenal in setting of nausea and vomiting and poor p.o. intake. He has good urine output today. Kidney function improved. No urinary retention. Avoid nephrotoxic drugs. Monitor renal function as outpatient.   Essential hypertension BP improved with metoprolol 25 bid.   Pancytytopenia - improved Thrombocytopenia (HCC)- chronic Platelets around 28.  WBC, Hb improved. He has h/o ETOH, Depakote stopped cause of thrombocytopenia. Continue to monitor weekly CBC outpatient due to his psychiatry medications.   Hypothyroidism Continue Levothyroxine 88 mcg daily    Dyslipidemia Continue Rosuvastatin, Lopid.   Schizophrenia, paranoid, chronic (HCC) With history of schizoaffective disorder Depakote held due to adverse effects, allergy list updated. Resumed Zyprexa, Cogentin as his lethargy improved. He need to follow up with Psychiatry as outpatient.   Generalized weakness- Worked with PT who advised SNF placement.      Consultants: Neurology Procedures performed: none  Disposition: Skilled nursing facility Diet recommendation:  Discharge Diet Orders (From admission, onward)     Start     Ordered   04/18/23 0000  Diet - low sodium heart healthy        04/18/23 1514           Cardiac and Carb modified diet DISCHARGE MEDICATION: Allergies as of 04/18/2023       Reactions   Valproic Acid And Related Other (See Comments)   Hyperammonemic encephalopathy   Phenytoin Sodium Extended Other (See Comments), Nausea And Vomiting   Prednisone Hives, Nausea And Vomiting   Latex Hives, Nausea And Vomiting, Rash        Medication List  STOP taking these medications    busPIRone 15 MG tablet Commonly known as: BUSPAR   divalproex 250 MG DR tablet Commonly known as: DEPAKOTE   potassium chloride 20  MEQ packet Commonly known as: KLOR-CON   traZODone 100 MG tablet Commonly known as: DESYREL       TAKE these medications    Abilify Maintena 400 MG Prsy prefilled syringe Generic drug: ARIPiprazole ER Inject 400 mg into the muscle every 28 (twenty-eight) days.   acetaminophen 500 MG tablet Commonly known as: TYLENOL Take 1,000 mg by mouth 3 (three) times daily as needed for mild pain or moderate pain.   ascorbic acid 500 MG tablet Commonly known as: VITAMIN C Take 1 tablet (500 mg total) by mouth daily.   aspirin EC 81 MG tablet Take 1 tablet (81 mg total) by mouth daily. Swallow whole.   benztropine 1 MG tablet Commonly known as: COGENTIN Take 1 tablet (1 mg total) by mouth daily.   cholecalciferol 25 MCG (1000 UNIT) tablet Commonly known as: VITAMIN D3 Take 1,000 Units by mouth daily.   fenofibrate 54 MG tablet Take 54 mg by mouth daily.   folic acid 1 MG tablet Commonly known as: FOLVITE Take 1 tablet (1 mg total) by mouth daily.   furosemide 20 MG tablet Commonly known as: LASIX Take 20 mg by mouth in the morning.   gemfibrozil 600 MG tablet Commonly known as: LOPID Take 600 mg by mouth 2 (two) times daily before a meal.   hydrOXYzine 50 MG tablet Commonly known as: ATARAX Take 50 mg by mouth 3 (three) times daily.   iron polysaccharides 150 MG capsule Commonly known as: NIFEREX Take 1 capsule (150 mg total) by mouth daily.   levothyroxine 88 MCG tablet Commonly known as: SYNTHROID Take 1 tablet (88 mcg total) by mouth daily at 6 (six) AM.   loperamide 2 MG capsule Commonly known as: IMODIUM Take 4 mg by mouth 2 (two) times daily.   metoprolol tartrate 25 MG tablet Commonly known as: LOPRESSOR Take 1 tablet (25 mg total) by mouth 2 (two) times daily.   OLANZapine 10 MG tablet Commonly known as: ZYPREXA Take 10 mg by mouth at bedtime. (Take with 20mg  tablet to equal 30mg  total) What changed: Another medication with the same name was changed.  Make sure you understand how and when to take each.   OLANZapine 20 MG tablet Commonly known as: ZYPREXA Take 1 tablet (20 mg total) by mouth at bedtime. What changed: additional instructions   polyethylene glycol 17 g packet Commonly known as: MIRALAX / GLYCOLAX Take 17 g by mouth daily.   potassium chloride SA 20 MEQ tablet Commonly known as: KLOR-CON M Take 20 mEq by mouth daily.   rosuvastatin 5 MG tablet Commonly known as: CRESTOR Take 5 mg by mouth daily.   senna 8.6 MG Tabs tablet Commonly known as: SENOKOT Take 2 tablets by mouth at bedtime.   Ventolin HFA 108 (90 Base) MCG/ACT inhaler Generic drug: albuterol Inhale 2 puffs into the lungs every 4 (four) hours as needed.               Discharge Care Instructions  (From admission, onward)           Start     Ordered   04/18/23 0000  Discharge wound care:       Comments: Keep your skin clean and dry, and use barrier creams to protect areas where pressure ulcers can develop. Out of bed  to chair,   04/18/23 1514            Contact information for after-discharge care     Destination     [facility] .   Service: Skilled Nursing Contact information: [address] [place] [address] [phone]                    Discharge Exam: Ceasar Mons Weights   04/12/23 1255  Weight: 90.1 kg   General - Middle aged Caucasian male, no acute distress HEENT - PERRLA, EOMI, atraumatic head, non tender sinuses. Lung - Clear, diffuse rales, no rhonchi, wheezes. Heart - S1, S2 heard, no murmurs, rubs, trace pedal edema. Abdomen - Soft, non tender nondistended, bowel sounds good Neuro -alert, awake, able to follow commands, has speech difficulty at baseline. Skin - Warm and dry.  Condition at discharge: stable  The results of significant diagnostics from this hospitalization (including imaging, microbiology, ancillary and laboratory) are listed below for  reference.   Imaging Studies: EEG adult  Result Date: 2023/04/28 Rejeana Brock, MD     04/28/2023  3:52 PM History: 52 year old presenting with encephalopathy Sedation: None Technique: This EEG was acquired with electrodes placed according to the International 10-20 electrode system (including Fp1, Fp2, F3, F4, C3, C4, P3, P4, O1, O2, T3, T4, T5, T6, A1, A2, Fz, Cz, Pz). The following electrodes were missing or displaced: none. Background: The background is dominated by low voltage generalized irregular slow activity, predominantly delta range with superimposed 5-6 hz theta.  With stimulation, there is briefly seen posterior dominant rhythm achieving a frequency of 7 to 8 Hz, but this is poorly sustained. Photic stimulation: Physiologic driving is not performed EEG Abnormalities: 1) generalized irregular slow activity Clinical Interpretation: This EEG is consistent with a generalized nonspecific cerebral dysfunction (encephalopathy). There was no seizure or seizure predisposition recorded on this study. Please note that lack of epileptiform activity on EEG does not preclude the possibility of epilepsy. Ritta Slot, MD [facility] [phone] If 7pm- 7am, please page neurology on call as listed in AMION.  CT HEAD WO CONTRAST ( )  Result Date: 04/12/2023 CLINICAL DATA:  Altered mental status. EXAM: CT HEAD WITHOUT CONTRAST TECHNIQUE: Contiguous axial images were obtained from the base of the skull through the vertex without intravenous contrast. RADIATION DOSE REDUCTION: This exam was performed according to the departmental dose-optimization program which includes automated exposure control, adjustment of the mA and/or kV according to patient size and/or use of iterative reconstruction technique. COMPARISON:  Head CT dated 02/21/2023. FINDINGS: Brain: The ventricles and sulci are appropriate size for the patient's age. The gray-white matter discrimination is preserved. There is  no acute intracranial hemorrhage. No mass effect or midline shift. No extra-axial fluid collection. Vascular: No hyperdense vessel or unexpected calcification. Skull: Normal. Negative for fracture or focal lesion. Sinuses/Orbits: No acute finding. Other: None IMPRESSION: Unremarkable noncontrast CT of the brain. Electronically Signed   By: Elgie Collard M.D.   On: 04/12/2023 22:19   CT ABDOMEN PELVIS WO CONTRAST  Result Date: 04/12/2023 CLINICAL DATA:  Nausea short of breath EXAM: CT ABDOMEN AND PELVIS WITHOUT CONTRAST TECHNIQUE: Multidetector CT imaging of the abdomen and pelvis was performed following the standard protocol without IV contrast. RADIATION DOSE REDUCTION: This exam was performed according to the departmental dose-optimization program which includes automated exposure control, adjustment of the mA and/or kV according to patient size and/or use of iterative reconstruction technique. COMPARISON:  CT 01/06/2023 FINDINGS: Lower  chest: Lung bases demonstrate no acute airspace disease. Hepatobiliary: No focal liver abnormality is seen. No gallstones, gallbladder wall thickening, or biliary dilatation. Pancreas: Atrophic. No pancreatic ductal dilatation or surrounding inflammatory changes. Spleen: Normal in size without focal abnormality. Adrenals/Urinary Tract: Adrenal glands are within normal limits. Kidneys show no hydronephrosis. Punctate nonobstructing kidney stones. The bladder is slightly thick walled. Stomach/Bowel: The stomach is nonenlarged. 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 Large stool in the colon. No acute bowel wall thickening or obstructive features. Moderate rectal distension by feces Vascular/Lymphatic: Mild aortic atherosclerosis. No aneurysm. No suspicious lymph nodes. Reproductive: Prostate is unremarkable. Other: Negative  for pelvic effusion or free air Musculoskeletal: No acute or suspicious abnormality. IMPRESSION: 1. Negative for hydronephrosis. Punctate nonobstructing kidney stones. 2. No evidence for bowel obstruction or bowel wall thickening. 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 3. Slightly thick-walled bladder, correlate with urinalysis. 4. Aortic atherosclerosis. Aortic Atherosclerosis (ICD10-I70.0). Electronically Signed   By: Jasmine Pang M.D.   On: 04/12/2023 19:14   DG Chest 2 View  Result Date: 04/12/2023 CLINICAL DATA:  Chest pain. EXAM: CHEST - 2 VIEW COMPARISON:  04/11/2022. FINDINGS: Patchy areas of scarring/atelectasis noted at the right lung base. Bilateral lung fields are otherwise clear. No acute consolidation or lung collapse. Bilateral costophrenic angles are clear. Normal cardio-mediastinal silhouette. No acute osseous abnormalities. The soft tissues are within normal limits. IMPRESSION: No active cardiopulmonary disease. Electronically Signed   By: Jules Schick M.D.   On: 04/12/2023 14:30    Microbiology: Results for orders placed or performed during the hospital encounter of 04/12/23  MRSA Next Gen by PCR, Nasal     Status: None   Collection Time: 04/12/23 10:21 PM   Specimen: Nasal Mucosa; Nasal Swab  Result Value Ref Range Status   MRSA by PCR Next Gen NOT DETECTED NOT DETECTED Final    Comment: (NOTE) The GeneXpert MRSA Assay (FDA approved for NASAL specimens only), is one component of a comprehensive MRSA colonization surveillance program. It is not intended to diagnose MRSA infection nor to guide or monitor treatment for MRSA infections. Test performance is not FDA approved in patients less than 38 years old. Performed at [facility], [address] [place], [place] [address]     Labs: CBC: Recent Labs  Lab 04/12/23 1351 04/13/23 0558 04/14/23 4696  04/15/23 0508 04/16/23 0314 04/17/23 0356 04/18/23 0440  WBC 3.6*  3.7*   < > 3.2* 6.6 10.1 11.1* 9.0  NEUTROABS 1.8  --  1.4*  --   --   --   --   HGB 12.3*  12.2*   < > 9.8* 11.6* 12.3* 10.4* 9.4*  HCT 36.2*  36.9*   < > 29.1* 33.4* 35.9* 29.8* 27.2*  MCV 83.0  84.1   < > 83.4 80.9 81.4 80.1 79.8*  PLT 34*  35*   < > 24* 28* 35* 40* 66*   < > = values in this interval not displayed.   Basic Metabolic Panel: Recent Labs  Lab 04/12/23 2027 04/12/23 2236 04/14/23 0148 04/15/23 0508 04/16/23 0314 04/17/23 0356 04/18/23 0440  NA  --    < > 137 136 136 134* 138  K  --    < > 4.2 3.6 3.3* 3.5 3.5  CL  --    < > 94* 95* 97* 101 109  CO2  --    < > 29 30 29 23 23   GLUCOSE  --    < > 142* 133* 146* 199* 160*  BUN  --    < > 45* 37* 34* 34* 32*  CREATININE  --    < > 2.68* 1.99* 1.68* 1.33* 1.24  CALCIUM  --    < > 9.1 9.3 9.3 9.0 9.2  MG 2.8*  --  2.9*  --   --   --   --   PHOS 5.0*  --  3.0  --   --   --   --    < > = values in this interval not displayed.   Liver Function Tests: Recent Labs  Lab 04/12/23 2236 04/14/23 0148 04/15/23 0508  AST 23 25 22   ALT 10 12 12   ALKPHOS 62 75 88  BILITOT 2.2* 2.1* 1.6*  PROT 6.5 6.2* 6.5  ALBUMIN 3.7 3.6 3.6   CBG: Recent Labs  Lab 04/17/23 1609 04/17/23 2150 04/18/23 0733 04/18/23 0805 04/18/23 1134  GLUCAP 199* 205* 126* 130* 221*    Discharge time spent: 42 minutes.  Signed: Marcelino Duster, MD [facility] 04/18/2023

## 2023-04-18 NOTE — Plan of Care (Signed)

## 2023-04-18 NOTE — Progress Notes (Signed)
Occupational Therapy Treatment Patient Details Name: Derrick Atkins MRN: [id number] DOB: Nov 19, 1970 Today's Date: 04/18/2023   History of present illness Derrick Atkins is a 52 year old male with history of cerebral palsy, depression, non-insulin-dependent diabetes mellitus, hypertension, schizoaffective disorder, scoliosis, who presents emergency department for chief concerns of nausea, vomiting, shortness of breath. Patient's blood sugars found to be very low at 35 and admitted for altered mental status, hypoglycemia, acute kidney injury.   OT comments  Derrick Atkins was seen for OT treatment on this date. Upon arrival to room pt reclined in bed, agreeable to tx. Pt requires MIN A bed mobility. MIN A + RW for ADL t/f, initial posterior lean with assist to correct. Multiple standing rest breaks to walk >100 ft. MIN A don/doff gown in sitting. Pt making good progress toward goals, will continue to follow POC. Discharge recommendation remains appropriate.        If plan is discharge home, recommend the following:  A little help with walking and/or transfers;A little help with bathing/dressing/bathroom;Help with stairs or ramp for entrance   Equipment Recommendations  BSC/3in1    Recommendations for Other Services      Precautions / Restrictions Precautions Precautions: Fall Restrictions Weight Bearing Restrictions: No       Mobility Bed Mobility Overal bed mobility: Needs Assistance Bed Mobility: Supine to Sit, Sit to Supine     Supine to sit: Min assist Sit to supine: Contact guard assist        Transfers Overall transfer level: Needs assistance Equipment used: Rolling walker (2 wheels) Transfers: Sit to/from Stand Sit to Stand: Min assist                 Balance Overall balance assessment: Needs assistance Sitting-balance support: No upper extremity supported, Feet supported Sitting balance-Leahy Scale: Fair     Standing balance support: Bilateral upper  extremity supported Standing balance-Leahy Scale: Fair Standing balance comment: initial posterior LOB                           ADL either performed or assessed with clinical judgement   ADL Overall ADL's : Needs assistance/impaired                                       General ADL Comments: MIN A + RW for ADL t/f. MIN A don/doff gown in sitting      Cognition Arousal: Alert Behavior During Therapy: WFL for tasks assessed/performed Overall Cognitive Status: History of cognitive impairments - at baseline                                                     Pertinent Vitals/ Pain       Pain Assessment Pain Assessment: No/denies pain   Frequency  Min 1X/week        Progress Toward Goals  OT Goals(current goals can now be found in the care plan section)  Progress towards OT goals: Progressing toward goals  Acute Rehab OT Goals Patient Stated Goal: to walk OT Goal Formulation: With patient Time For Goal Achievement: 04/29/23 Potential to Achieve Goals: Good ADL Goals Pt Will Perform Grooming: with modified independence;standing Pt Will Perform Lower Body Dressing: with modified independence;sit  to/from stand Pt Will Transfer to Toilet: with modified independence;ambulating;regular height toilet Pt Will Perform Toileting - Clothing Manipulation and hygiene: with modified independence;sitting/lateral leans;sit to/from stand  Plan      Co-evaluation                 AM-PAC OT "6 Clicks" Daily Activity     Outcome Measure   Help from another person eating meals?: None Help from another person taking care of personal grooming?: A Little Help from another person toileting, which includes using toliet, bedpan, or urinal?: A Lot Help from another person bathing (including washing, rinsing, drying)?: A Little Help from another person to put on and taking off regular upper body clothing?: A Little Help from another  person to put on and taking off regular lower body clothing?: A Lot 6 Click Score: 17    End of Session    OT Visit Diagnosis: Other abnormalities of gait and mobility (R26.89);Muscle weakness (generalized) (M62.81)   Activity Tolerance Patient tolerated treatment well   Patient Left in bed;with bed alarm set;with call bell/phone within reach   Nurse Communication Mobility status        Time: 1610-9604 OT Time Calculation (min): 25 min  Charges: OT General Charges $OT Visit: 1 Visit OT Treatments $Self Care/Home Management : 8-22 mins $Therapeutic Activity: 8-22 mins  Kathie Dike, M.S. OTR/L  04/18/23, 11:19 AM  ascom [phone]

## 2023-04-18 NOTE — TOC Transition Note (Signed)
Transition of Care [facility]) - CM/SW Discharge Note   Patient Details  Name: Derrick Atkins MRN: [id number] Date of Birth: 13-Oct-1970  [facility]) CM/SW Contact:  Chapman Fitch, RN Phone Number: 04/18/2023, 3:35 PM   Clinical Narrative:     Patient will DC to: [facility] Anticipated DC date: 04/18/23  Family notified: Senrell with DSS.  Sentrell to update Candice with DSS Transport by: [facility]  Per MD patient ready for DC to . RN, patient, patient's legal guardian , and facility notified of DC. Discharge Summary sent to facility. RN given number for report. DC packet on chart. Ambulance transport requested for patient.  TOC signing off.         Patient Goals and CMS Choice      Discharge Placement                         Discharge Plan and Services Additional resources added to the After Visit Summary for                                       Social Determinants of Health (SDOH) Interventions SDOH Screenings   Food Insecurity: No Food Insecurity (04/14/2023)  Housing: Low Risk  (04/14/2023)  Transportation Needs: Unmet Transportation Needs (04/14/2023)  Utilities: Not At Risk (04/14/2023)  Alcohol Screen: Low Risk  (03/23/2022)  Tobacco Use: Low Risk  (04/14/2023)     Readmission Risk Interventions    04/15/2023   11:03 AM  Readmission Risk Prevention Plan  Transportation Screening Complete  HRI or Home Care Consult Complete  Social Work Consult for Recovery Care Planning/Counseling Complete  Medication Review Oceanographer) Complete

## 2023-04-18 NOTE — TOC Progression Note (Addendum)
Transition of Care [facility]) - Progression Note    Patient Details  Name: Derrick Atkins MRN: [id number] Date of Birth: [birth year]  Transition [facility]) CM/SW Contact  Chapman Fitch, RN Phone Number: 04/18/2023, 10:08 AM  Clinical Narrative:     [phone] E valid through 05/18/23  No bed offers at this time Referral sent to [facility] rehab, and requested Revonda Standard at [facility] to review.    Sentrell with DSS updated    1215 pm - bed offers presented to [facility] with [facility].  [facility] rehab accepted.  Accepted in [place] and notified Revonda Standard at [facility].  Auth started in navi portal   Expected Discharge Plan: Group Home (with home health)    Expected Discharge Plan and Services                                               Social Determinants of Health (SDOH) Interventions SDOH Screenings   Food Insecurity: No Food Insecurity (04/14/2023)  Housing: Low Risk  (04/14/2023)  Transportation Needs: Unmet Transportation Needs (04/14/2023)  Utilities: Not At Risk (04/14/2023)  Alcohol Screen: Low Risk  (03/23/2022)  Tobacco Use: Low Risk  (04/14/2023)    Readmission Risk Interventions    04/15/2023   11:03 AM  Readmission Risk Prevention Plan  Transportation Screening Complete  HRI or Home Care Consult Complete  Social Work Consult for Recovery Care Planning/Counseling Complete  Medication Review Oceanographer) Complete

## 2023-04-18 NOTE — Care Management Important Message (Signed)
Important Message  Patient Details  Name: SION REINDERS MRN: [id number] Date of Birth: 03/29/1971   Important Message Given:  Yes - Medicare IM  I reviewed the Important Message from Medicare with the patient's legal guardian, Lenn Sink by phone [phone] and she stated she understood these rights. I thanked her for time and wished her a good afternoon.   Olegario Messier A Amour Cutrone 04/18/2023, 1:52 PM

## 2023-04-18 NOTE — Progress Notes (Signed)
Discharge instructions reviewed with patient and RN from receiving facility. Questions were encourage and answered. IV was taken out. Belongings were placed in pt's belonging bag and transported by EMS with patient

## 2023-04-27 ENCOUNTER — Ambulatory Visit: Payer: 59 | Admitting: Podiatry

## 2023-05-12 ENCOUNTER — Ambulatory Visit (INDEPENDENT_AMBULATORY_CARE_PROVIDER_SITE_OTHER): Payer: 59 | Admitting: Podiatry

## 2023-05-12 ENCOUNTER — Encounter: Payer: Self-pay | Admitting: Podiatry

## 2023-05-12 VITALS — Ht 75.0 in | Wt 198.0 lb

## 2023-05-12 DIAGNOSIS — M79674 Pain in right toe(s): Secondary | ICD-10-CM

## 2023-05-12 DIAGNOSIS — B351 Tinea unguium: Secondary | ICD-10-CM | POA: Diagnosis not present

## 2023-05-12 DIAGNOSIS — M79675 Pain in left toe(s): Secondary | ICD-10-CM | POA: Diagnosis not present

## 2023-05-12 NOTE — Progress Notes (Signed)
  Subjective:  Patient ID: Derrick Atkins, male    DOB: 03-15-1971,  MRN: [id number]  Chief Complaint  Patient presents with   Nail Problem    Patient is here for RFC   52 y.o. male returns for the above complaint.  Patient presents with thickened elongated dystrophic mycotic toenails x 10 mild pain on palpation worse with ambulation worse with pressure patient would like to have a debride and is not able to do it himself.  Denies any other acute complaints  Objective:  There were no vitals filed for this visit. Podiatric Exam: Vascular: dorsalis pedis and posterior tibial pulses are palpable bilateral. Capillary return is immediate. Temperature gradient is WNL. Skin turgor WNL  Sensorium: Normal Semmes Weinstein monofilament test. Normal tactile sensation bilaterally. Nail Exam: Pt has thick disfigured discolored nails with subungual debris noted bilateral entire nail hallux through fifth toenails.  Pain on palpation to the nails. Ulcer Exam: There is no evidence of ulcer or pre-ulcerative changes or infection. Orthopedic Exam: Muscle tone and strength are WNL. No limitations in general ROM. No crepitus or effusions noted.  Skin: No Porokeratosis. No infection or ulcers    Assessment & Plan:   1. Pain due to onychomycosis of toenails of both feet     Patient was evaluated and treated and all questions answered.  Onychomycosis with pain  -Nails palliatively debrided as below. -Educated on self-care  Procedure: Nail Debridement Rationale: pain  Type of Debridement: manual, sharp debridement. Instrumentation: Nail nipper, rotary burr. Number of Nails: 10  Procedures and Treatment: Consent by patient was obtained for treatment procedures. The patient understood the discussion of treatment and procedures well. All questions were answered thoroughly reviewed. Debridement of mycotic and hypertrophic toenails, 1 through 5 bilateral and clearing of subungual debris. No ulceration, no  infection noted.  Return Visit-Office Procedure: Patient instructed to return to the office for a follow up visit 3 months for continued evaluation and treatment.  Nicholes Rough, DPM    No follow-ups on file.

## 2023-05-24 ENCOUNTER — Emergency Department
Admission: EM | Admit: 2023-05-24 | Discharge: 2023-05-24 | Disposition: A | Discharge status: Home / Self Care | Payer: 59 | Source: Home / Self Care | Attending: Emergency Medicine | Admitting: Emergency Medicine

## 2023-05-24 ENCOUNTER — Other Ambulatory Visit: Payer: Self-pay

## 2023-05-24 DIAGNOSIS — R109 Unspecified abdominal pain: Secondary | ICD-10-CM | POA: Insufficient documentation

## 2023-05-24 DIAGNOSIS — R531 Weakness: Secondary | ICD-10-CM | POA: Diagnosis not present

## 2023-05-24 DIAGNOSIS — R112 Nausea with vomiting, unspecified: Secondary | ICD-10-CM | POA: Insufficient documentation

## 2023-05-24 DIAGNOSIS — N179 Acute kidney failure, unspecified: Secondary | ICD-10-CM | POA: Diagnosis not present

## 2023-05-24 DIAGNOSIS — F200 Paranoid schizophrenia: Secondary | ICD-10-CM | POA: Diagnosis not present

## 2023-05-24 LAB — COMPREHENSIVE METABOLIC PANEL
ALT: 10 U/L (ref 0–44)
AST: 17 U/L (ref 15–41)
Albumin: 4.2 g/dL (ref 3.5–5.0)
Alkaline Phosphatase: 53 U/L (ref 38–126)
Anion gap: 12 (ref 5–15)
BUN: 36 mg/dL — ABNORMAL HIGH (ref 6–20)
CO2: 22 mmol/L (ref 22–32)
Calcium: 9.2 mg/dL (ref 8.9–10.3)
Chloride: 108 mmol/L (ref 98–111)
Creatinine, Ser: 1.83 mg/dL — ABNORMAL HIGH (ref 0.61–1.24)
GFR, Estimated: 44 mL/min — ABNORMAL LOW (ref >60)
Glucose, Bld: 71 mg/dL (ref 70–99)
Potassium: 3.6 mmol/L (ref 3.5–5.1)
Sodium: 142 mmol/L (ref 135–145)
Total Bilirubin: 0.9 mg/dL (ref <1.2)
Total Protein: 6.9 g/dL (ref 6.5–8.1)

## 2023-05-24 LAB — CBC
HCT: 29.9 % — ABNORMAL LOW (ref 39.0–52.0)
Hemoglobin: 9.7 g/dL — ABNORMAL LOW (ref 13.0–17.0)
MCH: 28.4 pg (ref 26.0–34.0)
MCHC: 32.4 g/dL (ref 30.0–36.0)
MCV: 87.4 fL (ref 80.0–100.0)
Platelets: 135 10*3/uL — ABNORMAL LOW (ref 150–400)
RBC: 3.42 MIL/uL — ABNORMAL LOW (ref 4.22–5.81)
RDW: 15 % (ref 11.5–15.5)
WBC: 5 10*3/uL (ref 4.0–10.5)
nRBC: 0 % (ref 0.0–0.2)

## 2023-05-24 LAB — URINALYSIS, ROUTINE W REFLEX MICROSCOPIC
Bilirubin Urine: NEGATIVE
Glucose, UA: NEGATIVE mg/dL
Hgb urine dipstick: NEGATIVE
Ketones, ur: NEGATIVE mg/dL
Leukocytes,Ua: NEGATIVE
Nitrite: NEGATIVE
Protein, ur: NEGATIVE mg/dL
Specific Gravity, Urine: 1.013 (ref 1.005–1.030)
pH: 5 (ref 5.0–8.0)

## 2023-05-24 LAB — LIPASE, BLOOD: Lipase: 26 U/L (ref 11–51)

## 2023-05-24 NOTE — ED Triage Notes (Signed)
Pt arrives EMS from group home with reports of generalized abdominal pain and vomiting since last night.

## 2023-05-24 NOTE — ED Notes (Signed)
Pt requesting ice cream and stating he does not feel nauseous at this time.

## 2023-05-24 NOTE — ED Provider Notes (Signed)
[facility] Provider Note    Event Date/Time   First MD Initiated Contact with Patient 05/24/23 0701     (approximate)   History   Abdominal Pain and Emesis   HPI  Derrick Atkins is a 52 y.o. male  who presents to the emergency department today because of concerns for abdominal pain and nausea.  However by the time of my exam the patient states his symptoms have improved and he is asking to eat and drink.  Symptoms had started last night.  He states that the pain was located primarily in his upper abdomen.  He denies any diarrhea or change in his stool.  Denies any fevers or chills.     Physical Exam   Triage Vital Signs: ED Triage Vitals  Encounter Vitals Group     BP 05/24/23 0643 (!) 140/70     Systolic BP Percentile --      Diastolic BP Percentile --      Pulse Rate 05/24/23 0643 86     Resp 05/24/23 0643 18     Temp 05/24/23 0643 97.8 F (36.6 C)     Temp src --      SpO2 05/24/23 0643 100 %     Weight 05/24/23 0644 195 lb (88.5 kg)     Height --      Head Circumference --      Peak Flow --      Pain Score 05/24/23 0643 7     Pain Loc --      Pain Education --      Exclude from Growth Chart --     Most recent vital signs: Vitals:   05/24/23 0643  BP: (!) 140/70  Pulse: 86  Resp: 18  Temp: 97.8 F (36.6 C)  SpO2: 100%   General: Awake, alert, oriented. CV:  Good peripheral perfusion. Regular rate and rhythm. Resp:  Normal effort. Lungs clear. Abd:  No distention. Non tender.  ED Results / Procedures / Treatments   Labs (all labs ordered are listed, but only abnormal results are displayed) Labs Reviewed  COMPREHENSIVE METABOLIC PANEL - Abnormal; Notable for the following components:      Result Value   BUN 36 (*)    Creatinine, Ser 1.83 (*)    GFR, Estimated 44 (*)    All other components within normal limits  CBC - Abnormal; Notable for the following components:   RBC 3.42 (*)    Hemoglobin 9.7 (*)    HCT 29.9 (*)     Platelets 135 (*)    All other components within normal limits  LIPASE, BLOOD  URINALYSIS, ROUTINE W REFLEX MICROSCOPIC     EKG  None   RADIOLOGY None   PROCEDURES:  Critical Care performed: No   MEDICATIONS ORDERED IN ED: Medications - No data to display   IMPRESSION / MDM / ASSESSMENT AND PLAN / ED COURSE  I reviewed the triage vital signs and the nursing notes.                              Differential diagnosis includes, but is not limited to, pancreatitis, gastroenteritis, gastritis  Patient's presentation is most consistent with acute presentation with potential threat to life or bodily function.   Patient presented to the emergency department today because of concerns for abdominal pain, nausea that had resolved by the time my exam.  Blood work without concerning leukocytosis.  Abdomen is benign.  Patient did asked to eat and drink and was able to tolerate p.o. without any difficulty.  At this time I do not think emergent imaging is necessary.  Will plan on discharging.     FINAL CLINICAL IMPRESSION(S) / ED DIAGNOSES   Final diagnoses:  Abdominal pain, unspecified abdominal location       Note:  This document was prepared using Dragon voice recognition software and may include unintentional dictation errors.    Phineas Semen, MD 05/24/23 [phone]

## 2023-05-24 NOTE — ED Notes (Signed)
Spoke with [facility] in regards to Pt

## 2023-05-24 NOTE — ED Notes (Signed)
Pt states he does not have ABD pain or nausea at this time and is requesting ice cream, awaiting provider to see Pt

## 2023-05-24 NOTE — ED Notes (Signed)
Spoke with group home nurse and Candice from [facility] about Pts discharge. Pt tolerated 2 gatorades and ice cream with no bout of emesis or nausea. VSS

## 2023-05-24 NOTE — Discharge Instructions (Signed)
Please seek medical attention for any high fevers, chest pain, shortness of breath, change in behavior, persistent vomiting, bloody stool or any other new or concerning symptoms.  

## 2023-05-24 NOTE — ED Notes (Signed)
Called group home for update on ride. They stated they are sending a [employer] taxi and it should be there now. Placed Pt in wheelchair and transferred to car with minimal assistance.

## 2023-05-25 ENCOUNTER — Inpatient Hospital Stay: Payer: 59

## 2023-05-25 ENCOUNTER — Emergency Department: Payer: 59

## 2023-05-25 ENCOUNTER — Encounter: Payer: Self-pay | Admitting: Emergency Medicine

## 2023-05-25 ENCOUNTER — Other Ambulatory Visit: Payer: Self-pay

## 2023-05-25 ENCOUNTER — Inpatient Hospital Stay
Admission: EM | Admit: 2023-05-25 | Discharge: 2023-05-28 | DRG: 682 | Disposition: A | Discharge status: Other Acute Inpatient Hospital | Payer: 59 | Attending: Internal Medicine | Admitting: Internal Medicine

## 2023-05-25 DIAGNOSIS — Z7982 Long term (current) use of aspirin: Secondary | ICD-10-CM | POA: Diagnosis not present

## 2023-05-25 DIAGNOSIS — F200 Paranoid schizophrenia: Secondary | ICD-10-CM | POA: Diagnosis present

## 2023-05-25 DIAGNOSIS — Z8249 Family history of ischemic heart disease and other diseases of the circulatory system: Secondary | ICD-10-CM | POA: Diagnosis not present

## 2023-05-25 DIAGNOSIS — G809 Cerebral palsy, unspecified: Secondary | ICD-10-CM | POA: Diagnosis present

## 2023-05-25 DIAGNOSIS — D509 Iron deficiency anemia, unspecified: Secondary | ICD-10-CM | POA: Diagnosis present

## 2023-05-25 DIAGNOSIS — Z79890 Hormone replacement therapy (postmenopausal): Secondary | ICD-10-CM | POA: Diagnosis not present

## 2023-05-25 DIAGNOSIS — F29 Unspecified psychosis not due to a substance or known physiological condition: Secondary | ICD-10-CM | POA: Diagnosis not present

## 2023-05-25 DIAGNOSIS — T43595A Adverse effect of other antipsychotics and neuroleptics, initial encounter: Secondary | ICD-10-CM | POA: Diagnosis present

## 2023-05-25 DIAGNOSIS — D696 Thrombocytopenia, unspecified: Secondary | ICD-10-CM | POA: Diagnosis present

## 2023-05-25 DIAGNOSIS — R112 Nausea with vomiting, unspecified: Secondary | ICD-10-CM | POA: Diagnosis present

## 2023-05-25 DIAGNOSIS — E11649 Type 2 diabetes mellitus with hypoglycemia without coma: Secondary | ICD-10-CM | POA: Diagnosis present

## 2023-05-25 DIAGNOSIS — N2889 Other specified disorders of kidney and ureter: Secondary | ICD-10-CM | POA: Diagnosis present

## 2023-05-25 DIAGNOSIS — E876 Hypokalemia: Secondary | ICD-10-CM | POA: Diagnosis present

## 2023-05-25 DIAGNOSIS — E785 Hyperlipidemia, unspecified: Secondary | ICD-10-CM | POA: Diagnosis present

## 2023-05-25 DIAGNOSIS — Z79899 Other long term (current) drug therapy: Secondary | ICD-10-CM

## 2023-05-25 DIAGNOSIS — R4182 Altered mental status, unspecified: Secondary | ICD-10-CM | POA: Diagnosis not present

## 2023-05-25 DIAGNOSIS — R262 Difficulty in walking, not elsewhere classified: Secondary | ICD-10-CM | POA: Diagnosis present

## 2023-05-25 DIAGNOSIS — E872 Acidosis, unspecified: Secondary | ICD-10-CM | POA: Diagnosis present

## 2023-05-25 DIAGNOSIS — G9341 Metabolic encephalopathy: Secondary | ICD-10-CM | POA: Diagnosis present

## 2023-05-25 DIAGNOSIS — D72819 Decreased white blood cell count, unspecified: Secondary | ICD-10-CM

## 2023-05-25 DIAGNOSIS — G9340 Encephalopathy, unspecified: Secondary | ICD-10-CM | POA: Insufficient documentation

## 2023-05-25 DIAGNOSIS — E162 Hypoglycemia, unspecified: Secondary | ICD-10-CM | POA: Diagnosis present

## 2023-05-25 DIAGNOSIS — R569 Unspecified convulsions: Secondary | ICD-10-CM | POA: Diagnosis not present

## 2023-05-25 DIAGNOSIS — E039 Hypothyroidism, unspecified: Secondary | ICD-10-CM | POA: Diagnosis present

## 2023-05-25 DIAGNOSIS — E860 Dehydration: Secondary | ICD-10-CM | POA: Diagnosis present

## 2023-05-25 DIAGNOSIS — Z91040 Latex allergy status: Secondary | ICD-10-CM

## 2023-05-25 DIAGNOSIS — I10 Essential (primary) hypertension: Secondary | ICD-10-CM | POA: Diagnosis present

## 2023-05-25 DIAGNOSIS — N179 Acute kidney failure, unspecified: Secondary | ICD-10-CM | POA: Diagnosis present

## 2023-05-25 DIAGNOSIS — E119 Type 2 diabetes mellitus without complications: Secondary | ICD-10-CM

## 2023-05-25 DIAGNOSIS — R443 Hallucinations, unspecified: Secondary | ICD-10-CM | POA: Diagnosis present

## 2023-05-25 DIAGNOSIS — R531 Weakness: Secondary | ICD-10-CM | POA: Diagnosis present

## 2023-05-25 DIAGNOSIS — D649 Anemia, unspecified: Secondary | ICD-10-CM | POA: Diagnosis present

## 2023-05-25 DIAGNOSIS — Z888 Allergy status to other drugs, medicaments and biological substances status: Secondary | ICD-10-CM

## 2023-05-25 LAB — URINALYSIS, ROUTINE W REFLEX MICROSCOPIC
Bilirubin Urine: NEGATIVE
Glucose, UA: NEGATIVE mg/dL
Hgb urine dipstick: NEGATIVE
Ketones, ur: NEGATIVE mg/dL
Leukocytes,Ua: NEGATIVE
Nitrite: NEGATIVE
Protein, ur: NEGATIVE mg/dL
Specific Gravity, Urine: 1.016 (ref 1.005–1.030)
pH: 5 (ref 5.0–8.0)

## 2023-05-25 LAB — RESPIRATORY PANEL BY PCR

## 2023-05-25 LAB — CBC WITH DIFFERENTIAL/PLATELET
Abs Immature Granulocytes: 0.01 10*3/uL (ref 0.00–0.07)
Basophils Absolute: 0 10*3/uL (ref 0.0–0.1)
Basophils Relative: 0 %
Eosinophils Absolute: 0.1 10*3/uL (ref 0.0–0.5)
Eosinophils Relative: 1 %
HCT: 30 % — ABNORMAL LOW (ref 39.0–52.0)
Hemoglobin: 9.7 g/dL — ABNORMAL LOW (ref 13.0–17.0)
Immature Granulocytes: 0 %
Lymphocytes Relative: 32 %
Lymphs Abs: 1.2 10*3/uL (ref 0.7–4.0)
MCH: 28.5 pg (ref 26.0–34.0)
MCHC: 32.3 g/dL (ref 30.0–36.0)
MCV: 88.2 fL (ref 80.0–100.0)
Monocytes Absolute: 0.2 10*3/uL (ref 0.1–1.0)
Monocytes Relative: 5 %
Neutro Abs: 2.2 10*3/uL (ref 1.7–7.7)
Neutrophils Relative %: 62 %
Platelets: 126 10*3/uL — ABNORMAL LOW (ref 150–400)
RBC: 3.4 MIL/uL — ABNORMAL LOW (ref 4.22–5.81)
RDW: 14.6 % (ref 11.5–15.5)
WBC: 3.6 10*3/uL — ABNORMAL LOW (ref 4.0–10.5)
nRBC: 0 % (ref 0.0–0.2)

## 2023-05-25 LAB — COMPREHENSIVE METABOLIC PANEL
ALT: 9 U/L (ref 0–44)
AST: 14 U/L — ABNORMAL LOW (ref 15–41)
Albumin: 4.1 g/dL (ref 3.5–5.0)
Alkaline Phosphatase: 51 U/L (ref 38–126)
Anion gap: 11 (ref 5–15)
BUN: 32 mg/dL — ABNORMAL HIGH (ref 6–20)
CO2: 24 mmol/L (ref 22–32)
Calcium: 9.2 mg/dL (ref 8.9–10.3)
Chloride: 107 mmol/L (ref 98–111)
Creatinine, Ser: 1.6 mg/dL — ABNORMAL HIGH (ref 0.61–1.24)
GFR, Estimated: 52 mL/min — ABNORMAL LOW (ref >60)
Glucose, Bld: 94 mg/dL (ref 70–99)
Potassium: 3.8 mmol/L (ref 3.5–5.1)
Sodium: 142 mmol/L (ref 135–145)
Total Bilirubin: 0.8 mg/dL (ref <1.2)
Total Protein: 7 g/dL (ref 6.5–8.1)

## 2023-05-25 LAB — AMMONIA: Ammonia: 52 umol/L — ABNORMAL HIGH (ref 9–35)

## 2023-05-25 LAB — HEMOGLOBIN AND HEMATOCRIT, BLOOD
HCT: 28.8 % — ABNORMAL LOW (ref 39.0–52.0)
Hemoglobin: 9.7 g/dL — ABNORMAL LOW (ref 13.0–17.0)

## 2023-05-25 LAB — MRSA NEXT GEN BY PCR, NASAL: MRSA by PCR Next Gen: NOT DETECTED

## 2023-05-25 LAB — RESP PANEL BY RT-PCR (RSV, FLU A&B, COVID)  RVPGX2
Influenza A by PCR: NEGATIVE
Influenza B by PCR: NEGATIVE
Resp Syncytial Virus by PCR: NEGATIVE
SARS Coronavirus 2 by RT PCR: NEGATIVE

## 2023-05-25 LAB — PROCALCITONIN: Procalcitonin: <0.1 ng/mL

## 2023-05-25 LAB — LACTIC ACID, PLASMA
Lactic Acid, Venous: 2.9 mmol/L (ref 0.5–1.9)
Lactic Acid, Venous: 2.9 mmol/L (ref 0.5–1.9)

## 2023-05-25 LAB — TROPONIN I (HIGH SENSITIVITY)
Troponin I (High Sensitivity): 6 ng/L (ref <18)
Troponin I (High Sensitivity): 6 ng/L (ref <18)

## 2023-05-25 LAB — CBG MONITORING, ED: Glucose-Capillary: 91 mg/dL (ref 70–99)

## 2023-05-25 LAB — LIPASE, BLOOD: Lipase: 64 U/L — ABNORMAL HIGH (ref 11–51)

## 2023-05-25 LAB — GLUCOSE, CAPILLARY: Glucose-Capillary: 120 mg/dL — ABNORMAL HIGH (ref 70–99)

## 2023-05-25 LAB — MAGNESIUM: Magnesium: 2.4 mg/dL (ref 1.7–2.4)

## 2023-05-25 LAB — VALPROIC ACID LEVEL: Valproic Acid Lvl: 73 ug/mL (ref 50.0–100.0)

## 2023-05-25 MED ORDER — DEXTROSE 50 % IV SOLN: 25.0000 mL | INTRAVENOUS | Status: DC | PRN

## 2023-05-25 MED ORDER — OLANZAPINE 5 MG PO TABS
10.0000 mg | ORAL_TABLET | Freq: Every day | ORAL | Status: DC
  Administered 2023-05-25 – 2023-05-26 (×2): 10 mg via ORAL
  Filled 2023-05-25 (×2): qty 2

## 2023-05-25 MED ORDER — VITAMIN C 500 MG PO TABS
500.0000 mg | ORAL_TABLET | Freq: Every day | ORAL | Status: DC
  Administered 2023-05-25 – 2023-05-28 (×4): 500 mg via ORAL
  Filled 2023-05-25 (×4): qty 1

## 2023-05-25 MED ORDER — BENZTROPINE MESYLATE 1 MG PO TABS
1.0000 mg | ORAL_TABLET | Freq: Every day | ORAL | Status: DC
Start: 2023-05-26 — End: 2023-05-28
  Administered 2023-05-26 – 2023-05-28 (×3): 1 mg via ORAL
  Filled 2023-05-25 (×3): qty 1

## 2023-05-25 MED ORDER — ONDANSETRON HCL 4 MG/2ML IJ SOLN
4.0000 mg | Freq: Four times a day (QID) | INTRAMUSCULAR | Status: DC | PRN
Start: 2023-05-25 — End: 2023-05-27
  Administered 2023-05-25: 4 mg via INTRAVENOUS
  Filled 2023-05-25: qty 2

## 2023-05-25 MED ORDER — SODIUM CHLORIDE 0.9 % IV SOLN
2.0000 g | Freq: Three times a day (TID) | INTRAVENOUS | Status: DC
  Administered 2023-05-25 – 2023-05-26 (×3): 2 g via INTRAVENOUS
  Filled 2023-05-25 (×3): qty 12.5

## 2023-05-25 MED ORDER — SENNOSIDES-DOCUSATE SODIUM 8.6-50 MG PO TABS: 1.0000 | ORAL_TABLET | Freq: Every evening | ORAL | Status: DC | PRN

## 2023-05-25 MED ORDER — IOHEXOL 300 MG/ML  SOLN
100.0000 mL | Freq: Once | INTRAMUSCULAR | Status: AC | PRN
  Administered 2023-05-25: 100 mL via INTRAVENOUS

## 2023-05-25 MED ORDER — METOPROLOL TARTRATE 25 MG PO TABS
25.0000 mg | ORAL_TABLET | Freq: Two times a day (BID) | ORAL | Status: DC
  Administered 2023-05-25 – 2023-05-28 (×6): 25 mg via ORAL
  Filled 2023-05-25 (×6): qty 1

## 2023-05-25 MED ORDER — SENNA 8.6 MG PO TABS
2.0000 | ORAL_TABLET | Freq: Every day | ORAL | Status: DC
  Administered 2023-05-25 – 2023-05-27 (×2): 17.2 mg via ORAL
  Filled 2023-05-25 (×3): qty 2

## 2023-05-25 MED ORDER — LACTATED RINGERS IV BOLUS
1000.0000 mL | Freq: Once | INTRAVENOUS | Status: AC
  Administered 2023-05-25: 1000 mL via INTRAVENOUS

## 2023-05-25 MED ORDER — HEPARIN SODIUM (PORCINE) 5000 UNIT/ML IJ SOLN
5000.0000 [IU] | Freq: Three times a day (TID) | INTRAMUSCULAR | Status: DC
  Administered 2023-05-25 – 2023-05-26 (×2): 5000 [IU] via SUBCUTANEOUS
  Filled 2023-05-25 (×2): qty 1

## 2023-05-25 MED ORDER — ACETAMINOPHEN 325 MG PO TABS
650.0000 mg | ORAL_TABLET | Freq: Four times a day (QID) | ORAL | Status: DC | PRN
Start: 2023-05-25 — End: 2023-05-28

## 2023-05-25 MED ORDER — ALBUTEROL SULFATE (2.5 MG/3ML) 0.083% IN NEBU: 2.5000 mg | INHALATION_SOLUTION | RESPIRATORY_TRACT | Status: DC | PRN

## 2023-05-25 MED ORDER — POLYETHYLENE GLYCOL 3350 17 G PO PACK
17.0000 g | PACK | Freq: Two times a day (BID) | ORAL | Status: DC
  Administered 2023-05-25 – 2023-05-28 (×5): 17 g via ORAL
  Filled 2023-05-25 (×6): qty 1

## 2023-05-25 MED ORDER — FENOFIBRATE 54 MG PO TABS
54.0000 mg | ORAL_TABLET | Freq: Every day | ORAL | Status: DC
  Administered 2023-05-26 – 2023-05-28 (×3): 54 mg via ORAL
  Filled 2023-05-25 (×3): qty 1

## 2023-05-25 MED ORDER — VITAMIN D 25 MCG (1000 UNIT) PO TABS
1000.0000 [IU] | ORAL_TABLET | Freq: Every day | ORAL | Status: DC
  Administered 2023-05-26 – 2023-05-28 (×3): 1000 [IU] via ORAL
  Filled 2023-05-25 (×3): qty 1

## 2023-05-25 MED ORDER — POLYSACCHARIDE IRON COMPLEX 150 MG PO CAPS
150.0000 mg | ORAL_CAPSULE | Freq: Every day | ORAL | Status: DC
  Administered 2023-05-26 – 2023-05-27 (×2): 150 mg via ORAL
  Filled 2023-05-25 (×3): qty 1

## 2023-05-25 MED ORDER — PROCHLORPERAZINE EDISYLATE 10 MG/2ML IJ SOLN
10.0000 mg | Freq: Once | INTRAMUSCULAR | Status: AC
  Administered 2023-05-25: 10 mg via INTRAVENOUS
  Filled 2023-05-25: qty 2

## 2023-05-25 MED ORDER — ONDANSETRON HCL 4 MG PO TABS: 4.0000 mg | ORAL_TABLET | Freq: Four times a day (QID) | ORAL | Status: DC | PRN

## 2023-05-25 MED ORDER — VANCOMYCIN HCL 1750 MG/350ML IV SOLN
1750.0000 mg | Freq: Once | INTRAVENOUS | Status: AC
  Administered 2023-05-25: 1750 mg via INTRAVENOUS
  Filled 2023-05-25: qty 350

## 2023-05-25 MED ORDER — SODIUM CHLORIDE 0.9 % IV SOLN
INTRAVENOUS | Status: DC
Start: 2023-05-25 — End: 2023-05-26

## 2023-05-25 MED ORDER — ROSUVASTATIN CALCIUM 5 MG PO TABS
5.0000 mg | ORAL_TABLET | Freq: Every day | ORAL | Status: DC
Start: 2023-05-25 — End: 2023-05-28
  Administered 2023-05-25 – 2023-05-27 (×3): 5 mg via ORAL
  Filled 2023-05-25 (×3): qty 1

## 2023-05-25 MED ORDER — FOLIC ACID 1 MG PO TABS
1.0000 mg | ORAL_TABLET | Freq: Every day | ORAL | Status: DC
  Administered 2023-05-25 – 2023-05-28 (×4): 1 mg via ORAL
  Filled 2023-05-25 (×4): qty 1

## 2023-05-25 MED ORDER — LEVOTHYROXINE SODIUM 88 MCG PO TABS
88.0000 ug | ORAL_TABLET | Freq: Every day | ORAL | Status: DC
  Administered 2023-05-26 – 2023-05-28 (×3): 88 ug via ORAL
  Filled 2023-05-25 (×3): qty 1

## 2023-05-25 MED ORDER — ACETAMINOPHEN 650 MG RE SUPP: 650.0000 mg | Freq: Four times a day (QID) | RECTAL | Status: DC | PRN

## 2023-05-25 MED ORDER — HYDROXYZINE HCL 50 MG PO TABS: 50.0000 mg | ORAL_TABLET | Freq: Three times a day (TID) | ORAL | Status: DC

## 2023-05-25 MED ORDER — ASPIRIN 81 MG PO TBEC
81.0000 mg | DELAYED_RELEASE_TABLET | Freq: Every day | ORAL | Status: DC
  Administered 2023-05-26 – 2023-05-28 (×3): 81 mg via ORAL
  Filled 2023-05-25 (×3): qty 1

## 2023-05-25 MED ORDER — GEMFIBROZIL 600 MG PO TABS
600.0000 mg | ORAL_TABLET | Freq: Two times a day (BID) | ORAL | Status: DC
  Administered 2023-05-26 – 2023-05-28 (×5): 600 mg via ORAL
  Filled 2023-05-25 (×6): qty 1

## 2023-05-25 NOTE — Assessment & Plan Note (Signed)
Resumed home iron supplementation

## 2023-05-25 NOTE — Assessment & Plan Note (Signed)
Home rosuvastatin 5 mg nightly resumed

## 2023-05-25 NOTE — Assessment & Plan Note (Addendum)
History of hypoglycemia, D50 solution and 25 mL, 25 mL IV as needed for low blood sugar ordered

## 2023-05-25 NOTE — H&P (Addendum)
History and Physical   Derrick Atkins WGN:[id number] DOB: 1970/09/21 DOA: 05/25/2023  PCP: Koren Bound, NP  Outpatient Specialists: Dr. Inez Pilgrim, ophthalmology Patient coming from: [facility] via EMS  I have personally briefly reviewed patient's old medical records in [facility] EMR.  Chief Concern: Intractable nausea and vomiting  HPI: Mr. Derrick Atkins is a 52 year old male with history of dyslipidemia, chronic paranoid schizophrenia, chronic generalized weakness, pancytopenia, chronic thrombocytopenia, hypertension, who she emergency department from [facility] group home for chief concerns of intractable nausea and vomiting.  Vitals in the ED showed temperature of 97.8, respiration rate of 20, heart rate 77, blood pressure 143/84, SpO2 100% on room air.  Serum sodium is 142, potassium 3.8, chloride 107, bicarb 24, BUN 32, serum creatinine 1.60, EGFR 52, nonfasting glucose 94, WBC 3.6, hemoglobin 9.7, platelets of 126.  Ammonia level was 52.  Lipase was 64.  UA was negative for leukocytes and nitrates.  ED treatment: Compazine 10 mg IV one-time dose, LR 1 L bolus. ----------------------------------- At bedside, patient was able to tell me his first and last name, his age, the current calendar year 71, his current location of [facility].  Patient is eating spaghetti with red sauce and broccoli at bedside.  He does not appear to be in acute distress.  He reports the food taste very good as his maternal great great grandmother with [place].  He denies chest pain, shortness of breath, current nausea, dysuria, hematuria, diarrhea, swelling in his legs, syncope, loss of consciousness, fever, chills, cough.  He reports he vomited 1 time last night and states that it tasted really disgusting.  He also states 1 time this morning while in the EMS van on his way back to the hospital.  He does not remember what he ate last night.  He reports poor p.o. intake over the  last couple of days at the group home.  He endorses that in general the group home food is not very tasty so he does not enjoy eating there.  He does not know why he vomited last night and this morning.  He denies known sick contacts.  Social history: Patient lives in a group home.  He denies tobacco, EtOH, recreational drug use.  He is on disability and previously worked as an Ship broker at [employer].  ROS: Constitutional: no weight change, no fever ENT/Mouth: no sore throat, no rhinorrhea Eyes: no eye pain, no vision changes Cardiovascular: no chest pain, no dyspnea,  no edema, no palpitations Respiratory: no cough, no sputum, no wheezing Gastrointestinal: + nausea, + vomiting, no diarrhea, no constipation Genitourinary: no urinary incontinence, no dysuria, no hematuria Musculoskeletal: no arthralgias, no myalgias Skin: no skin lesions, no pruritus, Neuro: + weakness, no loss of consciousness, no syncope Psych: no anxiety, no depression, + decrease appetite Heme/Lymph: no bruising, no bleeding  ED Course: Discussed with EDP, patient requiring hospitalization for chief concerns of intractable nausea and vomiting.  Assessment/Plan  Principal Problem:   Intractable nausea and vomiting Active Problems:   AKI (acute kidney injury) (HCC)   Hypoglycemia   Leukopenia   Essential hypertension   Thrombocytopenia (HCC)   Schizophrenia, paranoid, chronic (HCC)   Hypothyroidism   Iron deficiency anemia   Hyperlipidemia   Type 2 diabetes mellitus without complications (HCC)   Ambulatory dysfunction   Normocytic anemia   Right sided weakness   Acute encephalopathy   Assessment and Plan:  * Intractable nausea and vomiting Etiology workup in progress, differentials include  infectious etiology versus Depakote side effects versus cardiac etiology Check procalcitonin, lactic acid, high sensitive troponin, stat portable chest x-ray Check COVID/flu A/flu B/RSV and 20 respiratory  pathogen panel order Fall precaution and aspiration precautions ordered Admit to telemetry medical, inpatient  Leukopenia In setting of intractable nausea and vomiting, renal involvement, with patient's WBC in the range of 5.0-11.1 over the last month, infectious etiology cannot be excluded at this time Check lactic acid on admission, procalcitonin If lactic acid is elevated, we will proceed with getting blood cultures and then initiation of broad-spectrum antibiotic  Hypoglycemia History of hypoglycemia, D50 solution and 25 mL, 25 mL IV as needed for low blood sugar ordered  AKI (acute kidney injury) (HCC) Suspect prerenal secondary to intractable nausea and vomiting, patient's baseline serum creatinine range is 1.24-1.33/eGFR greater than 60 about 1 month ago Status post LR 1 L bolus per EDP Continue with sodium chloride infusion at 125 mL/h, 1 day ordered  Thrombocytopenia (HCC) Improved from baseline range of 35-66  Essential hypertension Home metoprolol tartrate 25 mg p.o. twice daily resumed  Hyperlipidemia Home rosuvastatin 5 mg nightly resumed  Iron deficiency anemia Resumed home iron supplementation  Hypothyroidism Home levothyroxine 88 mcg daily resumed  Schizophrenia, paranoid, chronic (HCC) Per discharge summary on 04/18/2023, patient was discontinued on Depakote due to adverse effect: Hyperammonemic encephalopathy From group home, patient is currently on Depakote again for unknown reasons We will hold Depakote on admission Resumed home Zyprexa 10 mg nightly, benztropine 1 mg daily resumed for 05/26/2023  Acute encephalopathy Suspect metabolic encephalopathy secondary to Depakote use Valproic acid level was 73 on admission Of note, per discharge instruction on 04/18/2023, Depakote should be discontinued due to causing hyperammonemia encephalopathy Continue aggressive fluid hydration  Right sided weakness At baseline with ambulatory dysfunction and per patient  secondary to cerebral palsy PT, OT consulted for strength maintenance  Normocytic anemia At baseline  Ambulatory dysfunction Fall precaution  Chart reviewed.   DVT prophylaxis: Heparin 5000 units subcutaneous every 8 hours,  Code Status: Full code Diet: Clear liquid diet Family Communication: A phone call was offered, patient states that he does not really have any family members.  He states he has a brother however he does not keep in touch with his brother. Disposition Plan: Pending clinical course, guarded prognosis Consults called: PT, OT Admission status: Telemetry medical, inpatient  Past Medical History:  Diagnosis Date   Cerebral palsy (HCC)    Depression    Diabetes mellitus without complication (HCC)    Hypertension    Kidney stones    Schizoaffective disorder (HCC)    Scoliosis    Past Surgical History:  Procedure Laterality Date   BOWEL RESECTION     CATARACT EXTRACTION W/PHACO Right 09/15/2022   Procedure: CATARACT EXTRACTION PHACO AND INTRAOCULAR LENS PLACEMENT (IOC) RIGHT DIABETIC VISION BLUE HEALON 5  15.79  01:31.9;  Surgeon: Lockie Mola, MD;  Location: [facility];  Service: Ophthalmology;  Laterality: Right;  Latex Diabetic   INCISION AND DRAINAGE ABSCESS Left 12/20/2022   Procedure: INCISION AND DRAINAGE ABSCESS;  Surgeon: Campbell Lerner, MD;  Location: [facility];  Service: General;  Laterality: Left;   LAPAROTOMY N/A 05/15/2020   Procedure: EXPLORATORY LAPAROTOMY;  Surgeon: Henrene Dodge, MD;  Location: [facility];  Service: General;  Laterality: N/A;   Social History:  reports that he has never smoked. He has never been exposed to tobacco smoke. He has never used smokeless tobacco. He reports that he does not drink alcohol and  does not use drugs.  Allergies  Allergen Reactions   Valproic Acid And Related Other (See Comments)    Hyperammonemic encephalopathy   Phenytoin Sodium Extended Other (See Comments) and Nausea And Vomiting    Prednisone Hives and Nausea And Vomiting   Latex Hives, Nausea And Vomiting and Rash   Family History  Problem Relation Age of Onset   Heart disease Father    Heart attack Father    Family history: Family history reviewed and not pertinent.  Prior to Admission medications   Medication Sig Start Date End Date Taking? Authorizing Provider  ABILIFY MAINTENA 400 MG PRSY prefilled syringe Inject 400 mg into the muscle every 28 (twenty-eight) days.    [provider]  acetaminophen (TYLENOL) 500 MG tablet Take 1,000 mg by mouth 3 (three) times daily as needed for mild pain or moderate pain.    [provider]  ascorbic acid (VITAMIN C) 500 MG tablet Take 1 tablet (500 mg total) by mouth daily. 12/24/22   Marcelino Duster, MD  aspirin EC 81 MG tablet Take 1 tablet (81 mg total) by mouth daily. Swallow whole. 04/09/22   [name], [name], MD  benztropine (COGENTIN) 1 MG tablet Take 1 tablet (1 mg total) by mouth daily. 04/08/22   [name], [name], MD  cholecalciferol (VITAMIN D3) 25 MCG (1000 UNIT) tablet Take 1,000 Units by mouth daily.    [provider]  fenofibrate 54 MG tablet Take 54 mg by mouth daily.    [provider]  folic acid (FOLVITE) 1 MG tablet Take 1 tablet (1 mg total) by mouth daily. 12/24/22   Marcelino Duster, MD  furosemide (LASIX) 20 MG tablet Take 20 mg by mouth in the morning.    [provider]  gemfibrozil (LOPID) 600 MG tablet Take 600 mg by mouth 2 (two) times daily before a meal.    [provider]  hydrOXYzine (ATARAX) 50 MG tablet Take 50 mg by mouth 3 (three) times daily. 03/17/23   [provider]  iron polysaccharides (NIFEREX) 150 MG capsule Take 1 capsule (150 mg total) by mouth daily. 12/24/22   Marcelino Duster, MD  levothyroxine (SYNTHROID) 88 MCG tablet Take 1 tablet (88 mcg total) by mouth daily at 6 (six) AM. 04/09/22   [name], [name], MD  loperamide (IMODIUM) 2 MG capsule Take 4 mg by  mouth 2 (two) times daily. 03/17/23   [provider]  metoprolol tartrate (LOPRESSOR) 25 MG tablet Take 1 tablet (25 mg total) by mouth 2 (two) times daily. 04/18/23   Marcelino Duster, MD  OLANZapine (ZYPREXA) 10 MG tablet Take 10 mg by mouth at bedtime. (Take with 20mg  tablet to equal 30mg  total)    [provider]  OLANZapine (ZYPREXA) 20 MG tablet Take 1 tablet (20 mg total) by mouth at bedtime. Patient taking differently: Take 20 mg by mouth at bedtime. (Take with 10mg  tablet to equal 30mg  total) 04/08/22   [name], [name], MD  polyethylene glycol (MIRALAX / GLYCOLAX) 17 g packet Take 17 g by mouth daily.    [provider]  potassium chloride SA (KLOR-CON M) 20 MEQ tablet Take 20 mEq by mouth daily. 03/29/23   [provider]  rosuvastatin (CRESTOR) 5 MG tablet Take 5 mg by mouth daily.    [provider]  senna (SENOKOT) 8.6 MG TABS tablet Take 2 tablets by mouth at bedtime.    [provider]  VENTOLIN HFA 108 (90 Base) MCG/ACT inhaler Inhale  2 puffs into the lungs every 4 (four) hours as needed. 03/17/23   [provider]   Physical Exam: Vitals:   05/25/23 1300 05/25/23 1323 05/25/23 1434 05/25/23 1600  BP: 131/77  137/73 127/70  Pulse: 68  84 81  Resp: 18  16 16   Temp:  98 F (36.7 C) (!) 97.5 F (36.4 C) (!) 97.5 F (36.4 C)  TempSrc:  Oral    SpO2: 100%  100% 100%  Weight:      Height:       Constitutional: appears older than chronological age, frail, chronically ill, NAD, calm Eyes: PERRL, lids and conjunctivae normal ENMT: Mucous membranes are dry. Posterior pharynx clear of any exudate or lesions.  Poor dentition. Hearing appropriate Neck: normal, supple, no masses, no thyromegaly Respiratory: clear to auscultation bilaterally, no wheezing, no crackles. Normal respiratory effort. No accessory muscle use.  Cardiovascular: Regular rate and rhythm, no murmurs / rubs / gallops. No extremity edema. 2+ pedal  pulses. No carotid bruits.  Abdomen: Obese abdomen, no tenderness, no masses palpated, no hepatosplenomegaly. Bowel sounds positive.  Musculoskeletal: no clubbing / cyanosis. No joint deformity upper and lower extremities. Good ROM, no contractures, no atrophy. Normal muscle tone.  Skin: no rashes, lesions, ulcers. No induration.  Vertical scar in mid abdomen consistent with history of bowel surgery.  This vertical scar appears well-healed and chronic. Neurologic: Sensation intact. Strength 5/5 in left upper and left lower extremity.  Patient is weak at baseline on the right upper and right lower extremities.  Patient has difficulty grasping my fingers with his right hand.  Patient states this is baseline. Psychiatric: Normal judgment and insight. Alert and oriented x 3.  Depressed mood.  Flat affect  EKG: independently reviewed, showing sinus rhythm with rate of 77, QTc 496  Chest x-ray on Admission: I personally reviewed and I agree with radiologist reading as below.  DG Chest Port 1 View  Result Date: 05/25/2023 CLINICAL DATA:  Intractable nausea and vomiting. EXAM: PORTABLE CHEST 1 VIEW COMPARISON:  04/12/2023 and CT chest 09/23/2005. FINDINGS: Trachea is midline. Heart size normal. Lungs are somewhat low in volume with minimal left lower lobe subsegmental atelectasis. No dense airspace consolidation or pleural fluid. IMPRESSION: Low lung volumes with subsegmental left lower lobe atelectasis. Electronically Signed   By: Leanna Battles M.D.   On: 05/25/2023 16:21   CT ABDOMEN PELVIS W CONTRAST  Result Date: 05/25/2023 CLINICAL DATA:  Abdominal pain and nausea for several months. EXAM: CT ABDOMEN AND PELVIS WITH CONTRAST TECHNIQUE: Multidetector CT imaging of the abdomen and pelvis was performed using the standard protocol following bolus administration of intravenous contrast. RADIATION DOSE REDUCTION: This exam was performed according to the departmental dose-optimization program which  includes automated exposure control, adjustment of the mA and/or kV according to patient size and/or use of iterative reconstruction technique. CONTRAST:  OMNIPAQUE IOHEXOL 300 MG/ML  SOLN COMPARISON:  04/12/2023 FINDINGS: Lower Chest: No acute findings. Hepatobiliary: No suspicious hepatic masses identified. Gallbladder is unremarkable. No evidence of biliary ductal dilatation. Pancreas:  No mass or inflammatory changes. Spleen: Within normal limits in size and appearance. Adrenals/Urinary Tract: Possible small hyperenhancing mass measuring 10 mm is seen in the upper pole of the left kidney. No evidence of ureteral calculi or hydronephrosis. Stomach/Bowel: Congenital bowel malrotation again noted. Surgical clips again noted within the mesentery. No evidence of obstruction, inflammatory process or abnormal fluid collections. Vascular/Lymphatic: No pathologically enlarged lymph nodes. No acute vascular findings. Reproductive:  No mass  or other significant abnormality. Other:  None. Musculoskeletal:  No suspicious bone lesions identified. IMPRESSION: No acute findings. Possible small hyperenhancing mass in upper pole of left kidney. Recommend nonemergent outpatient abdomen MRI without and with contrast for further evaluation. Electronically Signed   By: Danae Orleans M.D.   On: 05/25/2023 12:17   CT Head Wo Contrast  Result Date: 05/25/2023 CLINICAL DATA:  Mental status change, unknown cause. EXAM: CT HEAD WITHOUT CONTRAST TECHNIQUE: Contiguous axial images were obtained from the base of the skull through the vertex without intravenous contrast. RADIATION DOSE REDUCTION: This exam was performed according to the departmental dose-optimization program which includes automated exposure control, adjustment of the mA and/or kV according to patient size and/or use of iterative reconstruction technique. COMPARISON:  Head CT 04/12/2023 FINDINGS: Brain: There is no evidence of an acute infarct, intracranial  hemorrhage, mass, midline shift, or extra-axial fluid collection. There is unchanged ex vacuo dilatation of the body of the left lateral ventricle secondary to a remote deep white matter insult. Vascular: No hyperdense vessel. Skull: No acute fracture or suspicious osseous lesion. Sinuses/Orbits: Visualized paranasal sinuses and mastoid air cells are clear. Right cataract extraction. Other: None. IMPRESSION: No evidence of acute intracranial abnormality. Electronically Signed   By: Sebastian Ache M.D.   On: 05/25/2023 10:31    Labs on Admission: I have personally reviewed following labs CBC: Recent Labs  Lab 05/24/23 0654 05/25/23 0846  WBC 5.0 3.6*  NEUTROABS  --  2.2  HGB 9.7* 9.7*  HCT 29.9* 30.0*  MCV 87.4 88.2  PLT 135* 126*   Basic Metabolic Panel: Recent Labs  Lab 05/24/23 0654 05/25/23 0846  NA 142 142  K 3.6 3.8  CL 108 107  CO2 22 24  GLUCOSE 71 94  BUN 36* 32*  CREATININE 1.83* 1.60*  CALCIUM 9.2 9.2  MG  --  2.4   GFR: Estimated Creatinine Clearance: 65.3 mL/min (A) (by C-G formula based on SCr of 1.6 mg/dL (H)).  Liver Function Tests: Recent Labs  Lab 05/24/23 0654 05/25/23 0846  AST 17 14*  ALT 10 9  ALKPHOS 53 51  BILITOT 0.9 0.8  PROT 6.9 7.0  ALBUMIN 4.2 4.1   Recent Labs  Lab 05/24/23 0654 05/25/23 0846  LIPASE 26 64*   Recent Labs  Lab 05/25/23 0941  AMMONIA 52*   CBG: Recent Labs  Lab 05/25/23 0849 05/25/23 1606  GLUCAP 91 120*   Urine analysis:    Component Value Date/Time   COLORURINE YELLOW (A) 05/25/2023 1026   APPEARANCEUR CLEAR (A) 05/25/2023 1026   APPEARANCEUR Clear 12/14/2012 0619   LABSPEC 1.016 05/25/2023 1026   LABSPEC 1.030 12/14/2012 0619   PHURINE 5.0 05/25/2023 1026   GLUCOSEU NEGATIVE 05/25/2023 1026   GLUCOSEU 50 mg/dL 16/04/9603 5409   HGBUR NEGATIVE 05/25/2023 1026   BILIRUBINUR NEGATIVE 05/25/2023 1026   BILIRUBINUR Negative 12/14/2012 0619   KETONESUR NEGATIVE 05/25/2023 1026   PROTEINUR NEGATIVE  05/25/2023 1026   NITRITE NEGATIVE 05/25/2023 1026   LEUKOCYTESUR NEGATIVE 05/25/2023 1026   LEUKOCYTESUR Negative 12/14/2012 0619   This document was prepared using Dragon Voice Recognition software and may include unintentional dictation errors.  Dr. Sedalia Muta [facility]  If 7PM-7AM, please contact overnight-coverage provider If 7AM-7PM, please contact day attending provider [email or web address]  05/25/2023, 4:42 PM

## 2023-05-25 NOTE — Plan of Care (Signed)
  Problem: Education: Goal: Knowledge of General Education information will improve Description: Including pain rating scale, medication(s)/side effects and non-pharmacologic comfort measures Outcome: Progressing   Problem: Clinical Measurements: Goal: Ability to maintain clinical measurements within normal limits will improve Outcome: Progressing Goal: Diagnostic test results will improve Outcome: Progressing   Problem: Nutrition: Goal: Adequate nutrition will be maintained Outcome: Progressing   

## 2023-05-25 NOTE — Assessment & Plan Note (Signed)
Home levothyroxine 88 mcg daily resumed

## 2023-05-25 NOTE — Assessment & Plan Note (Addendum)
Per discharge summary on 04/18/2023, patient was discontinued on Depakote due to adverse effect: Hyperammonemic encephalopathy From group home, patient is currently on Depakote again for unknown reasons We will hold Depakote on admission Resumed home Zyprexa 10 mg nightly, benztropine 1 mg daily resumed for 05/26/2023

## 2023-05-25 NOTE — Progress Notes (Addendum)
[facility] Note  Received message from nursing staff stating the critical value for lactic acid is: 2.9.  At bedside re-evaluation: he is able to tell me his first name, age, [birth year]25, and current location: [facility].   T 97.5, RR 16, 81, 127/20 (Map 85), spO2 is 100%. He denies being in pain. He states he is sleepy and nauseous. He reports that he does not get a lot of sleep at the facility because he is a nocturnal person.   He is arousable, he denies chest pain, shortness of breath.  He reports chills.  # Elevated lactic acid with mild acute kidney injury in setting of intractable nausea and vomiting # Leukopenia - Differentials include inflammatory process reactive secondary to dehydration in setting of intractable nausea and vomiting versus infectious etiology - Respiratory panel for COVID/influenza A/influenza B/RSV and 20 pathogen respiratory panel by PCR have been ordered and pending collection at the time of this dictation-nursing just received the patient in she states she will get these swab done - Blood cultures x 2 ordered, cefepime and vancomycin per pharmacy - Vital check stat now; goal MAP is greater than 65 - Vitals every 2 hours, 2 checks ordered - Continue to trend lactic acid when due for collection - LR 1 L bolus ordered; continue sodium chloride infusion at 150 mL/h, 1 day ordered from 125 mL/h - ordered stat H/H, MRI brain w/o contrast, EEG - Continue telemetry - Discussed the above with nursing  # Metabolic encephalopathy suspect secondary to valproic acid use causing hyperammonemia encephalopathy  Diet changed to NPO except for sips with meds and ice chips.  Procalcitonin is in process at the time of this dictation; if positive we will also add atypical pneumonia coverage with doxycycline IV given patient has history of elevated Qtc  Discussed with nursing whether or not patient will need to be transferred to another floor, pending response from charge  nurse.         Dr. Sedalia Muta         CRITICAL CARE Performed by: Dr. Sedalia Muta  Total critical care time: 32 minutes  Critical care time was exclusive of separately billable procedures and treating other patients.  Critical care was necessary to treat or prevent imminent or life-threatening deterioration.  Critical care was time spent personally by me on the following activities: development of treatment plan with patient and/or surrogate as well as nursing, discussions with consultants, evaluation of patient's response to treatment, examination of patient, obtaining history from patient or surrogate, ordering and performing treatments and interventions, ordering and review of laboratory studies, ordering and review of radiographic studies, pulse oximetry and re-evaluation of patient's condition.

## 2023-05-25 NOTE — Hospital Course (Addendum)
  Brief hospital course:  52 year old male with history of dyslipidemia, chronic paranoid schizophrenia, chronic generalized weakness, pancytopenia, chronic thrombocytopenia, hypertension, who she emergency department from [facility] group home for chief concerns of intractable nausea and vomiting.  CT abdomen pelvis was unremarkable.  UDS negative for THC.       Assessment and Plan:   Intractable nausea and vomiting  Unknown etiology.  CT abdomen pelvis negative.  UDS is also overall unremarkable.  Currently on PPI Diet as tolerated.   Lactic acidosis  Resolved with IV fluids   AKI  Admission creatinine 1.8, resolved with IV fluids.  Now at baseline 0.8  Metabolic encephalopathy Elevated ammonia - EEG shows generalized slowing, MRI brain overall unremarkable.  Ammonia levels improving, continue lactulose.  TSH normal   Hypoglycemia, Resolved  Resolved  Hypophosphatemia - Repletion   Leukopenia  could be secondary to olanzapine.  Monitor   HTN and HLD Continue Lopressor, fenofibrate, gemfibrozil and Crestor IV as needed   Hypothyroid Continue Synthroid   Chronic paranoid schizophrenia Continue Zyprexa 30 mg p.o. nightly Continue supportive care   Iron deficiency  continue oral iron supplement with vit. C   Left kidney mass  incidental finding Follow with PCP as an outpatient for dedicated renal MRI to rule out malignancy.  Diet: Soft diet DVT Prophylaxis: Subcutaneous Lovenox  Advance goals of care discussion: Full code  Family Communication:   Disposition:  Continue hospital stay until mentation has been stable Eventually will return to group home   Subjective:  Seen at bedside, no complaints.  Mentation slowly improving   Physical Exam: Constitutional: Not in acute distress Respiratory: Clear to auscultation bilaterally Cardiovascular: Normal sinus rhythm, no rubs Abdomen: Nontender nondistended good bowel sounds Musculoskeletal: No edema  noted Skin: No rashes seen Neurologic: CN 2-12 grossly intact.  And nonfocal Psychiatric: Normal judgment and insight.  Slightly sluggish in his responses     Body mass index is 24.25 kg/m.  Interventions:   Pressure Injury 04/12/23 Sacrum Stage 2 -  Partial thickness loss of dermis presenting as a shallow open injury with a red, pink wound bed without slough. open area 2x2 to sacrum (Active)  04/12/23 2230  Location: Sacrum  Location Orientation:   Staging: Stage 2 -  Partial thickness loss of dermis presenting as a shallow open injury with a red, pink wound bed without slough.  Wound Description (Comments): open area 2x2 to sacrum  Present on Admission: Yes

## 2023-05-25 NOTE — Assessment & Plan Note (Addendum)
Improved from baseline range of 35-66

## 2023-05-25 NOTE — Assessment & Plan Note (Signed)
Home metoprolol tartrate 25 mg p.o. twice daily resumed

## 2023-05-25 NOTE — Assessment & Plan Note (Signed)
Suspect metabolic encephalopathy secondary to Depakote use Valproic acid level was 73 on admission Of note, per discharge instruction on 04/18/2023, Depakote should be discontinued due to causing hyperammonemia encephalopathy Continue aggressive fluid hydration

## 2023-05-25 NOTE — ED Triage Notes (Signed)
Patient to ED via [facility] from [facility] for nausea. Seen here for last night for same. Care home sent him back due to him still having nausea but at baseline per staff. Did not eat breakfast. Denies diarrhea. Denies urinary difficulties.

## 2023-05-25 NOTE — Assessment & Plan Note (Signed)
 -  Fall precaution

## 2023-05-25 NOTE — ED Notes (Signed)
DSS at bedside with patient

## 2023-05-25 NOTE — Assessment & Plan Note (Addendum)
At baseline with ambulatory dysfunction and per patient secondary to cerebral palsy PT, OT consulted for strength maintenance

## 2023-05-25 NOTE — Assessment & Plan Note (Addendum)
Etiology workup in progress, differentials include infectious etiology versus Depakote side effects versus cardiac etiology Check procalcitonin, lactic acid, high sensitive troponin, stat portable chest x-ray Check COVID/flu A/flu B/RSV and 20 respiratory pathogen panel order Fall precaution and aspiration precautions ordered Admit to telemetry medical, inpatient

## 2023-05-25 NOTE — Assessment & Plan Note (Signed)
In setting of intractable nausea and vomiting, renal involvement, with patient's WBC in the range of 5.0-11.1 over the last month, infectious etiology cannot be excluded at this time Check lactic acid on admission, procalcitonin If lactic acid is elevated, we will proceed with getting blood cultures and then initiation of broad-spectrum antibiotic

## 2023-05-25 NOTE — Plan of Care (Signed)

## 2023-05-25 NOTE — Progress Notes (Signed)
Patient is refusing TED hose. MD notified and made aware. Patient educated and refusal documented.

## 2023-05-25 NOTE — Progress Notes (Signed)
Pharmacy Antibiotic Note  Derrick Atkins is a 52 y.o. male admitted on 05/25/2023 with sepsis. Patient presenting with intractable nausea and vomiting c/f infectious etiology. Patient remains afebrile with lactic acid of 2.9. Pharmacy has been consulted for vancomycin and cefepime dosing.   AKI on admission likely 2/2 to nausea and vomiting. Patient receiving IVF and Scr continuing to trend back to baseline of ~1.2-1.4.   Plan: Start cefepime 2 g IV every 8 hours based on current renal function Give vancomycin load of 1750 mg IV x1 Anticipating renal function to be back to baseline in morning, will re-check renal function in AM and determine appropriate vancomycin maintenance dose. CTM clinical status and LOT  Height: 6\' 3"  (190.5 cm) Weight: 88 kg (194 lb 0.1 oz) IBW/kg (Calculated) : 84.5  Temp (24hrs), Avg:97.8 F (36.6 C), Min:97.5 F (36.4 C), Max:98 F (36.7 C)  Recent Labs  Lab 05/24/23 0654 05/25/23 0846 05/25/23 1435  WBC 5.0 3.6*  --   CREATININE 1.83* 1.60*  --   LATICACIDVEN  --   --  2.9*    Estimated Creatinine Clearance: 65.3 mL/min (A) (by C-G formula based on SCr of 1.6 mg/dL (H)).    Allergies  Allergen Reactions   Valproic Acid And Related Other (See Comments)    Hyperammonemic encephalopathy   Phenytoin Sodium Extended Other (See Comments) and Nausea And Vomiting   Prednisone Hives and Nausea And Vomiting   Latex Hives, Nausea And Vomiting and Rash   Antimicrobials this admission: vancomycin 11/13 >>  cefepime 11/13 >>   Dose adjustments this admission: N/A  Microbiology results: 11/13 BCx: pending 11/13 MRSA PCR: ordered  Thank you for involving pharmacy in this patient's care.   Rockwell Alexandria, PharmD Clinical Pharmacist 05/25/2023 3:57 PM

## 2023-05-25 NOTE — Assessment & Plan Note (Signed)
Suspect prerenal secondary to intractable nausea and vomiting, patient's baseline serum creatinine range is 1.24-1.33/eGFR greater than 60 about 1 month ago Status post LR 1 L bolus per EDP Continue with sodium chloride infusion at 125 mL/h, 1 day ordered

## 2023-05-25 NOTE — Assessment & Plan Note (Signed)
At baseline 

## 2023-05-25 NOTE — ED Provider Notes (Signed)
[facility] Provider Note    Event Date/Time   First MD Initiated Contact with Patient 05/25/23 [phone]     (approximate)   History   Chief Complaint Nausea   HPI  Derrick Atkins is a 52 y.o. male with past medical history of hypertension, diabetes, cerebral palsy, and schizoaffective disorder who presents to the ED complaining of nausea and vomiting.  Per EMS, patient was seen in the ED yesterday for nausea and vomiting, subsequently discharged home following benign workup.  Since returning to his group home, patient has been unable to eat or drink anything, continues to complain of ongoing nausea with multiple episodes of vomiting.  He denies any diarrhea but does complain of diffuse abdominal pain.  Staff at his facility also reported that he "seemed off" from his baseline.  Patient denies any dysuria or flank pain, has not had any chest pain or shortness of breath.     Physical Exam   Triage Vital Signs: ED Triage Vitals  Encounter Vitals Group     BP --      Systolic BP Percentile --      Diastolic BP Percentile --      Pulse Rate 05/25/23 0843 77     Resp 05/25/23 0843 20     Temp 05/25/23 0843 97.8 F (36.6 C)     Temp Source 05/25/23 0843 Axillary     SpO2 05/25/23 0843 100 %     Weight 05/25/23 0840 194 lb 0.1 oz (88 kg)     Height 05/25/23 0840 6\' 3"  (1.905 m)     Head Circumference --      Peak Flow --      Pain Score --      Pain Loc --      Pain Education --      Exclude from Growth Chart --     Most recent vital signs: Vitals:   05/25/23 0945 05/25/23 1033  BP: 138/82 133/82  Pulse: 67 73  Resp: 13 11  Temp:    SpO2: 100% 100%    Constitutional: Somnolent but arousable to voice, oriented. Eyes: Conjunctivae are normal. Head: Atraumatic. Nose: No congestion/rhinnorhea. Mouth/Throat: Mucous membranes are moist.  Cardiovascular: Normal rate, regular rhythm. Grossly normal heart sounds.  2+ radial pulses  bilaterally. Respiratory: Normal respiratory effort.  No retractions. Lungs CTAB. Gastrointestinal: Soft and diffusely tender to palpation with no rebound or guarding. No distention. Musculoskeletal: No lower extremity tenderness nor edema.  Neurologic:  Normal speech and language. No gross focal neurologic deficits are appreciated.    ED Results / Procedures / Treatments   Labs (all labs ordered are listed, but only abnormal results are displayed) Labs Reviewed  CBC WITH DIFFERENTIAL/PLATELET - Abnormal; Notable for the following components:      Result Value   WBC 3.6 (*)    RBC 3.40 (*)    Hemoglobin 9.7 (*)    HCT 30.0 (*)    Platelets 126 (*)    All other components within normal limits  COMPREHENSIVE METABOLIC PANEL - Abnormal; Notable for the following components:   BUN 32 (*)    Creatinine, Ser 1.60 (*)    AST 14 (*)    GFR, Estimated 52 (*)    All other components within normal limits  LIPASE, BLOOD - Abnormal; Notable for the following components:   Lipase 64 (*)    All other components within normal limits  URINALYSIS, ROUTINE W REFLEX MICROSCOPIC - Abnormal; Notable  for the following components:   Color, Urine YELLOW (*)    APPearance CLEAR (*)    All other components within normal limits  AMMONIA - Abnormal; Notable for the following components:   Ammonia 52 (*)    All other components within normal limits  MAGNESIUM  VALPROIC ACID LEVEL  PROCALCITONIN  CBG MONITORING, ED  TROPONIN I (HIGH SENSITIVITY)     EKG  ED ECG REPORT I, Chesley Noon, the attending physician, personally viewed and interpreted this ECG.   Date: 05/25/2023  EKG Time: 8:43  Rate: 77  Rhythm: normal sinus rhythm  Axis: Normal  Intervals: Borderline prolonged QT  ST&T Change: None  RADIOLOGY CT head reviewed and interpreted by me with no hemorrhage or midline shift.  PROCEDURES:  Critical Care performed: No  Procedures   MEDICATIONS ORDERED IN ED: Medications   acetaminophen (TYLENOL) tablet 650 mg (has no administration in time range)    Or  acetaminophen (TYLENOL) suppository 650 mg (has no administration in time range)  ondansetron (ZOFRAN) tablet 4 mg (has no administration in time range)    Or  ondansetron (ZOFRAN) injection 4 mg (has no administration in time range)  heparin injection 5,000 Units (has no administration in time range)  senna-docusate (Senokot-S) tablet 1 tablet (has no administration in time range)  lactated ringers bolus 1,000 mL (0 mLs Intravenous Stopped 05/25/23 1200)  prochlorperazine (COMPAZINE) injection 10 mg (10 mg Intravenous Given 05/25/23 0942)  iohexol (OMNIPAQUE) 300 MG/ML solution 100 mL (100 mLs Intravenous Contrast Given 05/25/23 0952)     IMPRESSION / MDM / ASSESSMENT AND PLAN / ED COURSE  I reviewed the triage vital signs and the nursing notes.                              52 y.o. male with past medical history of hypertension, diabetes, cerebral palsy, and schizoaffective disorder who presents to the ED complaining of persistent nausea and vomiting with diffuse abdominal pain.  Patient's presentation is most consistent with acute presentation with potential threat to life or bodily function.  Differential diagnosis includes, but is not limited to, bowel obstruction, gastroenteritis, appendicitis, diverticulitis, cholecystitis, hepatitis, UTI, medication effect, electrolyte abnormality, AKI.  Patient chronically ill but nontoxic-appearing and in no acute distress, vital signs are unremarkable.  He is somnolent but easily arousable and does not seem to have any focal neurologic deficits.  Abdomen is soft but does seem diffusely tender to palpation and given his intractable vomiting with change in mental status, we will check CT head as well as CT of his abdomen/pelvis.  I reviewed his ED visit from yesterday which showed mild AKI, otherwise unremarkable.  Labs and urinalysis are pending, we will hydrate  with IV fluids and treat with IV Compazine given EKG shows borderline prolonged QT and Compazine with less QT prolongation than Zofran.  CT head is negative for acute process, CT of abdomen/pelvis is also unremarkable.  Renal function appears comparable to yesterday with no acute electrolyte abnormality, LFTs are unremarkable.  No significant anemia or leukocytosis noted.  It does appear he was restarted on Depakote at his group home despite this being stopped on his last admission due to concern it was contributing to his similar symptoms at that time.  Case discussed with hospitalist for admission.      FINAL CLINICAL IMPRESSION(S) / ED DIAGNOSES   Final diagnoses:  Intractable nausea and vomiting  Generalized weakness  Rx / DC Orders   ED Discharge Orders     None        Note:  This document was prepared using Dragon voice recognition software and may include unintentional dictation errors.   Chesley Noon, MD 05/25/23 1323

## 2023-05-26 DIAGNOSIS — R112 Nausea with vomiting, unspecified: Secondary | ICD-10-CM

## 2023-05-26 LAB — BASIC METABOLIC PANEL
Anion gap: 8 (ref 5–15)
BUN: 24 mg/dL — ABNORMAL HIGH (ref 6–20)
CO2: 23 mmol/L (ref 22–32)
Calcium: 8.7 mg/dL — ABNORMAL LOW (ref 8.9–10.3)
Chloride: 112 mmol/L — ABNORMAL HIGH (ref 98–111)
Creatinine, Ser: 1.18 mg/dL (ref 0.61–1.24)
GFR, Estimated: >60 mL/min (ref >60)
Glucose, Bld: 89 mg/dL (ref 70–99)
Potassium: 3.8 mmol/L (ref 3.5–5.1)
Sodium: 143 mmol/L (ref 135–145)

## 2023-05-26 LAB — URINE DRUG SCREEN, QUALITATIVE (ARMC ONLY)
Amphetamines, Ur Screen: NOT DETECTED
Barbiturates, Ur Screen: NOT DETECTED
Benzodiazepine, Ur Scrn: NOT DETECTED
Cannabinoid 50 Ng, Ur ~~LOC~~: NOT DETECTED
Cocaine Metabolite,Ur ~~LOC~~: NOT DETECTED
MDMA (Ecstasy)Ur Screen: NOT DETECTED
Methadone Scn, Ur: NOT DETECTED
Opiate, Ur Screen: NOT DETECTED
Phencyclidine (PCP) Ur S: NOT DETECTED
Tricyclic, Ur Screen: POSITIVE — AB

## 2023-05-26 LAB — LACTIC ACID, PLASMA: Lactic Acid, Venous: 2 mmol/L (ref 0.5–1.9)

## 2023-05-26 LAB — CBC
HCT: 25 % — ABNORMAL LOW (ref 39.0–52.0)
Hemoglobin: 8.5 g/dL — ABNORMAL LOW (ref 13.0–17.0)
MCH: 28.8 pg (ref 26.0–34.0)
MCHC: 34 g/dL (ref 30.0–36.0)
MCV: 84.7 fL (ref 80.0–100.0)
Platelets: 95 10*3/uL — ABNORMAL LOW (ref 150–400)
RBC: 2.95 MIL/uL — ABNORMAL LOW (ref 4.22–5.81)
RDW: 14.6 % (ref 11.5–15.5)
WBC: 3 10*3/uL — ABNORMAL LOW (ref 4.0–10.5)
nRBC: 0 % (ref 0.0–0.2)

## 2023-05-26 LAB — PHOSPHORUS: Phosphorus: 2.9 mg/dL (ref 2.5–4.6)

## 2023-05-26 LAB — GLUCOSE, CAPILLARY: Glucose-Capillary: 78 mg/dL (ref 70–99)

## 2023-05-26 LAB — AMMONIA: Ammonia: 118 umol/L — ABNORMAL HIGH (ref 9–35)

## 2023-05-26 LAB — MAGNESIUM: Magnesium: 2 mg/dL (ref 1.7–2.4)

## 2023-05-26 MED ORDER — PROCHLORPERAZINE EDISYLATE 10 MG/2ML IJ SOLN: 10.0000 mg | Freq: Four times a day (QID) | INTRAMUSCULAR | Status: DC | PRN

## 2023-05-26 MED ORDER — DEXTROSE-SODIUM CHLORIDE 5-0.9 % IV SOLN
INTRAVENOUS | Status: AC
Start: 2023-05-26 — End: 2023-05-27

## 2023-05-26 MED ORDER — ENOXAPARIN SODIUM 40 MG/0.4ML IJ SOSY
40.0000 mg | PREFILLED_SYRINGE | Freq: Every evening | INTRAMUSCULAR | Status: DC
  Administered 2023-05-26 – 2023-05-27 (×2): 40 mg via SUBCUTANEOUS
  Filled 2023-05-26 (×2): qty 0.4

## 2023-05-26 MED ORDER — PANTOPRAZOLE SODIUM 40 MG PO TBEC: 40.0000 mg | DELAYED_RELEASE_TABLET | Freq: Every day | ORAL | Status: DC

## 2023-05-26 MED ORDER — PANTOPRAZOLE SODIUM 40 MG PO TBEC
40.0000 mg | DELAYED_RELEASE_TABLET | Freq: Two times a day (BID) | ORAL | Status: DC
  Administered 2023-05-26 – 2023-05-28 (×5): 40 mg via ORAL
  Filled 2023-05-26 (×5): qty 1

## 2023-05-26 MED ORDER — ARIPIPRAZOLE ER 400 MG IM PRSY
400.0000 mg | PREFILLED_SYRINGE | INTRAMUSCULAR | Status: DC
  Administered 2023-05-27: 400 mg via INTRAMUSCULAR
  Filled 2023-05-26: qty 400

## 2023-05-26 NOTE — TOC Progression Note (Signed)
Transition of Care [facility]) - Progression Note    Patient Details  Name: ANEIL ANGELINI MRN: [id number] Date of Birth: 20-Jul-1970  Transition of Care [facility]) CM/SW Contact  Marlowe Sax, RN Phone Number: 05/26/2023, 4:50 PM  Clinical Narrative:     Merry Proud with the group home called and stated that he is already set up with [facility] and they would like to continue, I notified Elnita Maxwell With [facility]  Expected Discharge Plan: Home w Home Health Services Barriers to Discharge: No Barriers Identified  Expected Discharge Plan and Services   Discharge Planning Services: CM Consult   Living arrangements for the past 2 months: Group Home                 DME Arranged: N/A DME Agency: NA         HH Agency: [facility] Date [facility] Agency Contacted: 05/26/23 Time HH Agency Contacted: 1649 Representative spoke with at [facility] Agency: cheryl   Social Determinants of Health (SDOH) Interventions SDOH Screenings   Food Insecurity: No Food Insecurity (05/25/2023)  Housing: Low Risk  (05/25/2023)  Transportation Needs: No Transportation Needs (05/25/2023)  Recent Concern: Transportation Needs - Unmet Transportation Needs (04/14/2023)  Utilities: Not At Risk (05/25/2023)  Alcohol Screen: Low Risk  (03/23/2022)  Tobacco Use: Low Risk  (05/25/2023)    Readmission Risk Interventions    04/15/2023   11:03 AM  Readmission Risk Prevention Plan  Transportation Screening Complete  HRI or Home Care Consult Complete  Social Work Consult for Recovery Care Planning/Counseling Complete  Medication Review Oceanographer) Complete

## 2023-05-26 NOTE — TOC Progression Note (Signed)
Transition of Care [facility]) - Progression Note    Patient Details  Name: RAKAN LIMES MRN: [id number] Date of Birth: April 30, 1971  Transition of Care [facility]) CM/SW Contact  Marlowe Sax, RN Phone Number: 05/26/2023, 4:26 PM  Clinical Narrative:     Spoke with Candice the legal guardian, she stated that the group home will provide transportation for him to return to the group home They do not have a preference for [facility] and are agreeable to me calling the group home [facility] to get information about their preference, he has DME at the group home  [facility] [phone] The owner of the group home and left a vm for a call back        Expected Discharge Plan and Services                                               Social Determinants of Health (SDOH) Interventions SDOH Screenings   Food Insecurity: No Food Insecurity (05/25/2023)  Housing: Low Risk  (05/25/2023)  Transportation Needs: No Transportation Needs (05/25/2023)  Recent Concern: Transportation Needs - Unmet Transportation Needs (04/14/2023)  Utilities: Not At Risk (05/25/2023)  Alcohol Screen: Low Risk  (03/23/2022)  Tobacco Use: Low Risk  (05/25/2023)    Readmission Risk Interventions    04/15/2023   11:03 AM  Readmission Risk Prevention Plan  Transportation Screening Complete  HRI or Home Care Consult Complete  Social Work Consult for Recovery Care Planning/Counseling Complete  Medication Review Oceanographer) Complete

## 2023-05-26 NOTE — Procedures (Signed)
Patient Name: ROWDY BELGARDE  MRN: [id number]  Epilepsy Attending: Charlsie Quest  Referring Physician/Provider: Lovenia Kim, DO  Date: 05/27/2023 Duration: 33.31 mins  Patient history: 52yo M with ams getting eeg to evaluate for seizure  Level of alertness: Awake, asleep  AEDs during EEG study: None  Technical aspects: This EEG study was done with scalp electrodes positioned according to the 10-20 International system of electrode placement. Electrical activity was reviewed with band pass filter of 1-70Hz , sensitivity of 7 uV/mm, display speed of 42mm/sec with a 60Hz  notched filter applied as appropriate. EEG data were recorded continuously and digitally stored.  Video monitoring was available and reviewed as appropriate.  Description: The posterior dominant rhythm consists of 7.5 Hz activity of moderate voltage (25-35 uV) seen predominantly in posterior head regions, symmetric and reactive to eye opening and eye closing. Sleep was characterized by vertex waves, sleep spindles (12 to 14 Hz), maximal frontocentral region. EEG showed continuous generalized 5 to 7 Hz theta slowing. Hyperventilation and photic stimulation were not performed.     ABNORMALITY - Continuous slow, generalized  IMPRESSION: This study is suggestive of mild to moderate diffuse encephalopathy. No seizures or epileptiform discharges were seen throughout the recording.  Evah Rashid Annabelle Harman

## 2023-05-26 NOTE — Evaluation (Signed)
Physical Therapy Evaluation Patient Details Name: Derrick Atkins MRN: [id number] DOB: 1971-04-23 Today's Date: 05/26/2023  History of Present Illness  Mr. Derrick Atkins is a 52 year old male with history of dyslipidemia, chronic paranoid schizophrenia, chronic generalized weakness, pancytopenia, chronic thrombocytopenia, hypertension, and cerebral palsy presented to emergency department from [facility] group home for chief concerns of intractable nausea and vomiting.  Clinical Impression  Pt in recliner on arrival, lethargic and slow to respond with most questions but generally pleasant and willing to participate.  He subjectively reports nausea and dizziness but did not show any overt adverse symptoms t/o session. Pt with baseline CP associated weakness but was able to rise w/o assist, ambulate w/ and w/o AD and get into bed all w/o direct assist.  He reports "60%" of his normal and states he can normally be up and active at the group home. Pt is not at baseline, will benefit from continued PT her and on return to group home.        If plan is discharge home, recommend the following: A little help with walking and/or transfers;Assistance with cooking/housework;Assist for transportation;Help with stairs or ramp for entrance   Can travel by private vehicle        Equipment Recommendations None recommended by PT  Recommendations for Other Services       Functional Status Assessment Patient has had a recent decline in their functional status and demonstrates the ability to make significant improvements in function in a reasonable and predictable amount of time.     Precautions / Restrictions Precautions Precautions: Fall Restrictions Weight Bearing Restrictions: No      Mobility  Bed Mobility Overal bed mobility: Modified Independent             General bed mobility comments: in recliner on arrival, gets himself supine in bed w/o assist    Transfers Overall  transfer level: Needs assistance Equipment used: None Transfers: Sit to/from Stand Sit to Stand: Contact guard assist           General transfer comment: expected reliance on UEs, unable to attain TKE (baseline) but rises w/o assist or safety concerns    Ambulation/Gait Ambulation/Gait assistance: Supervision Gait Distance (Feet): 70 Feet Assistive device: Rolling walker (2 wheels), None         General Gait Details: 35 ft with walker, 35 w/o.  Pt with slow, shuffling, flat-footed, knee flexed ambulation.  No buckling despite lack of TKE - minimal change in cadence w/ vs w/o AD.  Pt endorses fatigue/weakness but that his gait is not too far from baseline.  Stairs            Wheelchair Mobility     Tilt Bed    Modified Rankin (Stroke Patients Only)       Balance Overall balance assessment: Needs assistance Sitting-balance support: No upper extremity supported, Feet supported Sitting balance-Leahy Scale: Good     Standing balance support: No upper extremity supported, During functional activity Standing balance-Leahy Scale: Fair Standing balance comment: No LOBs or overt buckling, etc but baseline knee/posture is not ideal - h/o falls that he reports are usually happening at night                             Pertinent Vitals/Pain Pain Assessment Pain Assessment: No/denies pain    Home Living Family/patient expects to be discharged to:: Group home Living Arrangements: Group Home Available Help at Discharge: Available  PRN/intermittently Type of Home: Group Home Home Access: Ramped entrance       Home Layout: One level Home Equipment: Agricultural consultant (2 wheels);Grab bars - tub/shower      Prior Function Prior Level of Function : Needs assist;History of Falls (last six months) (reports ~17fall/mo)             Mobility Comments: endorses falls hx, unable to state quantity       Extremity/Trunk Assessment   Upper Extremity  Assessment Upper Extremity Assessment: Generalized weakness (baseline CP-associated weakness/wasting/positioning - L stronger than R)    Lower Extremity Assessment Lower Extremity Assessment: Generalized weakness (baseline CP-associated weakness/wasting/positioning)       Communication   Communication Communication: Difficulty communicating thoughts/reduced clarity of speech;Difficulty following commands/understanding Following commands: Follows one step commands inconsistently Cueing Techniques: Verbal cues  Cognition Arousal: Lethargic Behavior During Therapy: WFL for tasks assessed/performed Overall Cognitive Status: Within Functional Limits for tasks assessed                                 General Comments: similar presentation as prior admission with delayed/partial responses to questions, need to re-frame        General Comments General comments (skin integrity, edema, etc.): Pt endorses vague nasuea and dizziness during session but did not display any overt systems    Exercises     Assessment/Plan    PT Assessment Patient needs continued PT services  PT Problem List Decreased strength;Decreased range of motion;Decreased activity tolerance;Decreased balance       PT Treatment Interventions DME instruction;Gait training;Functional mobility training;Therapeutic activities;Therapeutic exercise;Balance training;Patient/family education    PT Goals (Current goals can be found in the Care Plan section)  Acute Rehab PT Goals Patient Stated Goal: Go home PT Goal Formulation: With patient Time For Goal Achievement: 06/08/23 Potential to Achieve Goals: Good    Frequency Min 1X/week     Co-evaluation               AM-PAC PT "6 Clicks" Mobility  Outcome Measure Help needed turning from your back to your side while in a flat bed without using bedrails?: None Help needed moving from lying on your back to sitting on the side of a flat bed without  using bedrails?: None Help needed moving to and from a bed to a chair (including a wheelchair)?: None Help needed standing up from a chair using your arms (e.g., wheelchair or bedside chair)?: None Help needed to walk in hospital room?: A Little Help needed climbing 3-5 steps with a railing? : A Little 6 Click Score: 22    End of Session Equipment Utilized During Treatment: Gait belt Activity Tolerance: Patient tolerated treatment well Patient left: with bed alarm set;with call bell/phone within reach Nurse Communication: Mobility status PT Visit Diagnosis: Muscle weakness (generalized) (M62.81);Difficulty in walking, not elsewhere classified (R26.2);Dizziness and giddiness (R42)    Time: 7829-5621 PT Time Calculation (min) (ACUTE ONLY): 39 min   Charges:   PT Evaluation $PT Eval Low Complexity: 1 Low PT Treatments $Gait Training: 8-22 mins PT General Charges $$ ACUTE PT VISIT: 1 Visit         Malachi Pro, DPT 05/26/2023, 1:15 PM

## 2023-05-26 NOTE — TOC Initial Note (Signed)
Transition of Care [facility]) - Initial/Assessment Note    Patient Details  Name: Derrick Atkins MRN: [id number] Date of Birth: 08-01-1970  Transition of Care [facility]) CM/SW Contact:    Marlowe Sax, RN Phone Number: 05/26/2023, 3:51 PM  Clinical Narrative:                 Patient lives at group home [facility]  [place] [address]    Has a legal guardian    Lenn Sink  [phone]  He has Rolling Walker (2 wheels);Grab bars - tub/shower  and a ramped entrance Called legal guardian and left a VM asking for a return call to discuss DC plan and needs  Patient Goals and CMS Choice            Expected Discharge Plan and Services                                              Prior Living Arrangements/Services                       Activities of Daily Living   ADL Screening (condition at time of admission) Independently performs ADLs?: Yes (appropriate for developmental age) Is the patient deaf or have difficulty hearing?: No Does the patient have difficulty seeing, even when wearing glasses/contacts?: No Does the patient have difficulty concentrating, remembering, or making decisions?: Yes  Permission Sought/Granted                  Emotional Assessment              Admission diagnosis:  Generalized weakness [R53.1] Intractable nausea and vomiting [R11.2] Patient Active Problem List   Diagnosis Date Noted   Intractable nausea and vomiting 05/25/2023   Leukopenia 05/25/2023   Right sided weakness 05/25/2023   Acute encephalopathy 05/25/2023   Altered mental status 04/12/2023   Leg swelling 04/12/2023   Ambulatory dysfunction 02/26/2023   DM2 (diabetes mellitus, type 2) (HCC) 02/26/2023   Hyperlipidemia 02/26/2023   Prolonged QT interval 02/26/2023   Normocytic anemia 02/26/2023   Thrombocytopenia (HCC) 02/21/2023   Rhabdomyolysis 02/21/2023   Hypoglycemia 02/21/2023   Abnormal LFTs 02/21/2023   Fall  at home, initial encounter 02/21/2023   Cellulitis of left lower extremity 01/06/2023   Iron deficiency anemia 01/06/2023   Cellulitis and abscess of left lower extremity 01/06/2023   Nausea & vomiting 01/06/2023   AKI (acute kidney injury) (HCC) 12/13/2022   Dyslipidemia 12/13/2022   Schizophrenia (HCC) 03/23/2022   Aggressive behavior    Schizophrenia, paranoid, chronic (HCC)    Hypothyroidism 12/25/2013   Essential hypertension 05/03/2007   Type 2 diabetes mellitus without complications (HCC) 05/03/2007   PCP:  Koren Bound, NP Pharmacy:   [facility] - [place], [place] - [address] [place] [address] Phone: [phone] Fax: [phone]  [facility] - [place], [place] - [address] [place] [address] Phone: [phone] Fax: [phone]     Social Determinants of Health (SDOH) Social History: SDOH Screenings   Food Insecurity: No Food Insecurity (05/25/2023)  Housing: Low Risk  (05/25/2023)  Transportation Needs: No Transportation Needs (05/25/2023)  Recent Concern: Transportation Needs - Unmet Transportation Needs (04/14/2023)  Utilities: Not At Risk (05/25/2023)  Alcohol Screen: Low Risk  (  03/23/2022)  Tobacco Use: Low Risk  (05/25/2023)   SDOH Interventions:     Readmission Risk Interventions    04/15/2023   11:03 AM  Readmission Risk Prevention Plan  Transportation Screening Complete  HRI or Home Care Consult Complete  Social Work Consult for Recovery Care Planning/Counseling Complete  Medication Review Oceanographer) Complete

## 2023-05-26 NOTE — Evaluation (Signed)
Occupational Therapy Evaluation Patient Details Name: MAVERIC PILTZ MRN: [id number] DOB: Apr 10, 1971 Today's Date: 05/26/2023   History of Present Illness Mr. Soren Fazzolari is a 52 year old male with history of dyslipidemia, chronic paranoid schizophrenia, chronic generalized weakness, pancytopenia, chronic thrombocytopenia, hypertension, and cerebral palsy presented to emergency department from [facility] group [facility] for chief concerns of intractable nausea and vomiting.   Clinical Impression   Mr Ralston was seen for OT evaluation this date. Prior to hospital admission, pt was at group home, reports falls hx. Pt oriented to self, date as Nov, and location as [facility]. Pt currently requires no assist bed mobility, initial MIN A + RW sit<>stand 2/2 posterior lean, on 3rd attempt pt stands no assist. CGA for toilet t/f and standing grooming tasks with no AD use (pt elects not to use RW). Pt would benefit from skilled OT to address noted impairments and functional limitations (see below for any additional details). Upon hospital discharge, recommend OT follow up.    If plan is discharge home, recommend the following: Supervision due to cognitive status;Help with stairs or ramp for entrance;Direct supervision/assist for medications management;Direct supervision/assist for financial management    Functional Status Assessment  Patient has had a recent decline in their functional status and demonstrates the ability to make significant improvements in function in a reasonable and predictable amount of time.  Equipment Recommendations  None recommended by OT    Recommendations for Other Services       Precautions / Restrictions Precautions Precautions: Fall Restrictions Weight Bearing Restrictions: No      Mobility Bed Mobility Overal bed mobility: Modified Independent             General bed mobility comments: rail use    Transfers Overall transfer level: Needs  assistance Equipment used: Rolling walker (2 wheels) Transfers: Sit to/from Stand Sit to Stand: Contact guard assist           General transfer comment: initial MIN A 2/2 posterior lean, on 3rd attempt pt stands no assist      Balance Overall balance assessment: Needs assistance Sitting-balance support: No upper extremity supported, Feet supported Sitting balance-Leahy Scale: Good     Standing balance support: No upper extremity supported, During functional activity Standing balance-Leahy Scale: Fair                             ADL either performed or assessed with clinical judgement   ADL Overall ADL's : Needs assistance/impaired                                       General ADL Comments: CGA for toilet t/f and standing grooming tasks. MOD I don/doff B socks in sitting      Pertinent Vitals/Pain Pain Assessment Pain Assessment: No/denies pain     Extremity/Trunk Assessment Upper Extremity Assessment Upper Extremity Assessment: Overall WFL for tasks assessed   Lower Extremity Assessment Lower Extremity Assessment: Generalized weakness       Communication Communication Communication: Difficulty communicating thoughts/reduced clarity of speech;Difficulty following commands/understanding Following commands: Follows one step commands with increased time Cueing Techniques: Verbal cues   Cognition Arousal: Alert Behavior During Therapy: WFL for tasks assessed/performed Overall Cognitive Status: Within Functional Limits for tasks assessed  Home Living Family/patient expects to be discharged to:: Group home       Home Access: Level entry     Home Layout: One level     Bathroom Shower/Tub: Tub/shower unit         Home Equipment: Agricultural consultant (2 wheels);Grab bars - tub/shower          Prior Functioning/Environment Prior Level of Function : Needs assist;History of  Falls (last six months)             Mobility Comments: endorses falls hx, unable to state quantity          OT Problem List: Decreased strength;Decreased range of motion;Decreased activity tolerance;Impaired balance (sitting and/or standing)      OT Treatment/Interventions: Self-care/ADL training;Therapeutic exercise;Energy conservation;DME and/or AE instruction;Therapeutic activities    OT Goals(Current goals can be found in the care plan section) Acute Rehab OT Goals Patient Stated Goal: to go home OT Goal Formulation: With patient Time For Goal Achievement: 06/09/23 Potential to Achieve Goals: Good ADL Goals Pt Will Perform Grooming: Independently;standing Pt Will Perform Lower Body Dressing: Independently;sit to/from stand Pt Will Transfer to Toilet: Independently;ambulating;regular height toilet  OT Frequency: Min 1X/week    Co-evaluation              AM-PAC OT "6 Clicks" Daily Activity     Outcome Measure Help from another person eating meals?: None Help from another person taking care of personal grooming?: A Little Help from another person toileting, which includes using toliet, bedpan, or urinal?: A Little Help from another person bathing (including washing, rinsing, drying)?: A Little Help from another person to put on and taking off regular upper body clothing?: None Help from another person to put on and taking off regular lower body clothing?: A Little 6 Click Score: 20   End of Session Equipment Utilized During Treatment: Gait belt;Rolling walker (2 wheels)  Activity Tolerance: Patient tolerated treatment well Patient left: in chair;with call bell/phone within reach;with chair alarm set  OT Visit Diagnosis: Unsteadiness on feet (R26.81)                Time: 1884-1660 OT Time Calculation (min): 28 min Charges:  OT General Charges $OT Visit: 1 Visit OT Evaluation $OT Eval Moderate Complexity: 1 Mod OT Treatments $Self Care/Home Management : 8-22  mins  Kathie Dike, M.S. OTR/L  05/26/23, 9:55 AM  ascom [phone]

## 2023-05-26 NOTE — Progress Notes (Signed)
[facility] Progress Note  Patient: Derrick Atkins    JXB:[id number]  DOA: 05/25/2023     Date of Service: the patient was seen and examined on 05/26/2023  Chief Complaint  Patient presents with   Nausea   Brief hospital course: Mr. Derrick Atkins is a 52 year old male with history of dyslipidemia, chronic paranoid schizophrenia, chronic generalized weakness, pancytopenia, chronic thrombocytopenia, hypertension, who she emergency department from [facility] group home for chief concerns of intractable nausea and vomiting.   Vitals in the ED showed temperature of 97.8, respiration rate of 20, heart rate 77, blood pressure 143/84, SpO2 100% on room air.   Serum sodium is 142, potassium 3.8, chloride 107, bicarb 24, BUN 32, serum creatinine 1.60, EGFR 52, nonfasting glucose 94, WBC 3.6, hemoglobin 9.7, platelets of 126.   Ammonia level was 52.  Lipase was 64.  UA was negative for leukocytes and nitrates.   ED treatment: Compazine 10 mg IV one-time dose, LR 1 L bolus.   Assessment and Plan:   # Intractable nausea and vomiting  Unknown cause Continue symptomatic treatment for nausea and vomiting Check UDS to rule out THC induced cyclic nausea and vomiting Started pantoprazole 40 mg p.o. twice daily for 3 days followed by PPI daily  # Lactic acidosis most likely due to intractable nausea and vomiting IV fluid was given, empirically patient was given antibiotics Procalcitonin negative, antibiotic discontinued, no signs of infection. Continue IV fluid for hydration Follow repeat lactic acid level  # AKI due to dehydration. Resolved  Creatinine 1.83----1.18 improved after IV fluid AKI resolved.  Monitor renal functions and urine output  # Hypoglycemia, Resolved  History of hypoglycemia, D50 solution and 25 mL, 25 mL IV as needed for low blood sugar ordered  # Leukopenia, could be secondary to olanzapine No signs of infection, neutrophils within normal range No need of  antibiotics at this time Monitor CBC with differential Consider psych consult if no improvement in the WBC count  # HTN and HLD Continue Lopressor 25 p.o. twice daily, fenofibrate and gemfibrozil home dose Continued Crestor Monitor BP and titrate medications accordingly  # Hypothyroid, continue Synthroid  # Chronic paranoid schizophrenia Continue Zyprexa 10 mg p.o. nightly Continue supportive care  # Iron deficiency, continue oral iron supplement with vit. C  # Left kidney mass, incidental finding Follow with PCP as an outpatient for dedicated renal MRI to rule out malignancy.   Body mass index is 24.25 kg/m.  Interventions:  Pressure Injury 04/12/23 Sacrum Stage 2 -  Partial thickness loss of dermis presenting as a shallow open injury with a red, pink wound bed without slough. open area 2x2 to sacrum (Active)  04/12/23 2230  Location: Sacrum  Location Orientation:   Staging: Stage 2 -  Partial thickness loss of dermis presenting as a shallow open injury with a red, pink wound bed without slough.  Wound Description (Comments): open area 2x2 to sacrum  Present on Admission: Yes     Diet: Soft diet DVT Prophylaxis: Subcutaneous Lovenox   Advance goals of care discussion: Full code  Family Communication: family was not present at bedside, at the time of interview.  The pt provided permission to discuss medical plan with the family. Opportunity was given to ask question and all questions were answered satisfactorily.   Disposition:  Pt is from Group home, admitted with tractable nausea and vomiting, still has electrolyte imbalance, diet resumed, patient is on IV fluid, which precludes a safe discharge. Discharge to  group home, when stable most likely in 1 to 2 days. Follow PT/OT eval and TOC for DC plan  Subjective: No significant events overnight, patient was sitting comfortably on the recliner.  AOx3, little slow to response and felt nausea in the morning time, no  vomiting after eating breakfast.  Patient is passing gas, no BM yet but he feels that he needs to go for BM.  Denied any other complaints.  Physical Exam: General: NAD, lying comfortably Appear in no distress, affect appropriate Eyes: PERRLA ENT: Oral Mucosa Clear, moist  Neck: no JVD,  Cardiovascular: S1 and S2 Present, no Murmur,  Respiratory: good respiratory effort, Bilateral Air entry equal and Decreased, no Crackles, no wheezes Abdomen: Bowel Sound present, Soft and no tenderness,  Skin: no rashes Extremities: no Pedal edema, no calf tenderness Neurologic: without any new focal findings Gait not checked due to patient safety concerns  Vitals:   05/25/23 2332 05/26/23 0045 05/26/23 0319 05/26/23 0738  BP: 124/70 129/70 132/69 138/68  Pulse: 67 60 63 64  Resp: 18 18 18 16   Temp: 97.6 F (36.4 C) 98.4 F (36.9 C) 98.2 F (36.8 C) 98.3 F (36.8 C)  TempSrc:    Oral  SpO2: 100% 100% 100% 100%  Weight:      Height:        Intake/Output Summary (Last 24 hours) at 05/26/2023 1059 Last data filed at 05/26/2023 1016 Gross per 24 hour  Intake 1761.66 ml  Output 1225 ml  Net 536.66 ml   Filed Weights   05/25/23 0840  Weight: 88 kg    Data Reviewed: I have personally reviewed and interpreted daily labs, tele strips, imagings as discussed above. I reviewed all nursing notes, pharmacy notes, vitals, pertinent old records I have discussed plan of care as described above with RN and patient/family.  CBC: Recent Labs  Lab 05/24/23 0654 05/25/23 0846 05/25/23 1631 05/26/23 0605  WBC 5.0 3.6*  --  3.0*  NEUTROABS  --  2.2  --   --   HGB 9.7* 9.7* 9.7* 8.5*  HCT 29.9* 30.0* 28.8* 25.0*  MCV 87.4 88.2  --  84.7  PLT 135* 126*  --  95*   Basic Metabolic Panel: Recent Labs  Lab 05/24/23 0654 05/25/23 0846 05/26/23 0605  NA 142 142 143  K 3.6 3.8 3.8  CL 108 107 112*  CO2 22 24 23   GLUCOSE 71 94 89  BUN 36* 32* 24*  CREATININE 1.83* 1.60* 1.18  CALCIUM 9.2 9.2  8.7*  MG  --  2.4 2.0  PHOS  --   --  2.9    Studies: MR BRAIN WO CONTRAST  Result Date: 05/26/2023 CLINICAL DATA:  Neuro deficit with acute stroke suspected. Mental status change. EXAM: MRI HEAD WITHOUT CONTRAST TECHNIQUE: Multiplanar, multiecho pulse sequences of the brain and surrounding structures were obtained without intravenous contrast. COMPARISON:  Head CT from earlier today FINDINGS: Brain: No acute infarction, hemorrhage, hydrocephalus, extra-axial collection or mass lesion. Periventricular insult with gliosis and volume loss at the body of the left lateral ventricle. Generalized cerebral volume loss greater than expected for age. Vascular: Normal flow voids. Skull and upper cervical spine: Normal marrow signal. Sinuses/Orbits: Negative. IMPRESSION: 1. No acute finding. 2. Atrophy and remote left periventricular insult. Electronically Signed   By: Tiburcio Pea M.D.   On: 05/26/2023 06:25   DG Chest Port 1 View  Result Date: 05/25/2023 CLINICAL DATA:  Intractable nausea and vomiting. EXAM: PORTABLE CHEST 1 VIEW COMPARISON:  04/12/2023 and CT chest 09/23/2005. FINDINGS: Trachea is midline. Heart size normal. Lungs are somewhat low in volume with minimal left lower lobe subsegmental atelectasis. No dense airspace consolidation or pleural fluid. IMPRESSION: Low lung volumes with subsegmental left lower lobe atelectasis. Electronically Signed   By: Leanna Battles M.D.   On: 05/25/2023 16:21    Scheduled Meds:  ascorbic acid  500 mg Oral Daily   aspirin EC  81 mg Oral Daily   benztropine  1 mg Oral Daily   cholecalciferol  1,000 Units Oral Daily   fenofibrate  54 mg Oral Daily   folic acid  1 mg Oral Daily   gemfibrozil  600 mg Oral BID AC   heparin  5,000 Units Subcutaneous Q8H   iron polysaccharides  150 mg Oral Daily   levothyroxine  88 mcg Oral Q0600   metoprolol tartrate  25 mg Oral BID   OLANZapine  10 mg Oral QHS   pantoprazole  40 mg Oral BID   Followed by   Melene Muller ON  05/29/2023] pantoprazole  40 mg Oral Daily   polyethylene glycol  17 g Oral BID   rosuvastatin  5 mg Oral QHS   senna  2 tablet Oral QHS   Continuous Infusions:  dextrose 5 % and 0.9 % NaCl 125 mL/hr at 05/26/23 0108   PRN Meds: acetaminophen **OR** acetaminophen, albuterol, dextrose, ondansetron **OR** ondansetron (ZOFRAN) IV, prochlorperazine, senna-docusate  Time spent: 35 minutes  Author: Gillis Santa. MD [facility] 05/26/2023 10:59 AM  To reach On-call, see care teams to locate the attending and reach out to them via [email or web address]. If 7PM-7AM, please contact night-coverage If you still have difficulty reaching the attending provider, please page the [facility] (Director on Call) for [facility] on amion for assistance.

## 2023-05-27 ENCOUNTER — Ambulatory Visit: Payer: 59

## 2023-05-27 DIAGNOSIS — R569 Unspecified convulsions: Secondary | ICD-10-CM | POA: Diagnosis not present

## 2023-05-27 DIAGNOSIS — R4182 Altered mental status, unspecified: Secondary | ICD-10-CM | POA: Diagnosis not present

## 2023-05-27 DIAGNOSIS — R112 Nausea with vomiting, unspecified: Secondary | ICD-10-CM | POA: Diagnosis not present

## 2023-05-27 LAB — BASIC METABOLIC PANEL
Anion gap: 8 (ref 5–15)
BUN: 13 mg/dL (ref 6–20)
CO2: 22 mmol/L (ref 22–32)
Calcium: 8.7 mg/dL — ABNORMAL LOW (ref 8.9–10.3)
Chloride: 111 mmol/L (ref 98–111)
Creatinine, Ser: 0.82 mg/dL (ref 0.61–1.24)
GFR, Estimated: >60 mL/min (ref >60)
Glucose, Bld: 110 mg/dL — ABNORMAL HIGH (ref 70–99)
Potassium: 3.6 mmol/L (ref 3.5–5.1)
Sodium: 141 mmol/L (ref 135–145)

## 2023-05-27 LAB — CBC WITH DIFFERENTIAL/PLATELET
Abs Immature Granulocytes: 0.01 10*3/uL (ref 0.00–0.07)
Basophils Absolute: 0 10*3/uL (ref 0.0–0.1)
Basophils Relative: 0 %
Eosinophils Absolute: 0.1 10*3/uL (ref 0.0–0.5)
Eosinophils Relative: 2 %
HCT: 25.4 % — ABNORMAL LOW (ref 39.0–52.0)
Hemoglobin: 8.6 g/dL — ABNORMAL LOW (ref 13.0–17.0)
Immature Granulocytes: 0 %
Lymphocytes Relative: 45 %
Lymphs Abs: 1.5 10*3/uL (ref 0.7–4.0)
MCH: 28.5 pg (ref 26.0–34.0)
MCHC: 33.9 g/dL (ref 30.0–36.0)
MCV: 84.1 fL (ref 80.0–100.0)
Monocytes Absolute: 0.2 10*3/uL (ref 0.1–1.0)
Monocytes Relative: 7 %
Neutro Abs: 1.5 10*3/uL — ABNORMAL LOW (ref 1.7–7.7)
Neutrophils Relative %: 46 %
Platelets: 91 10*3/uL — ABNORMAL LOW (ref 150–400)
RBC: 3.02 MIL/uL — ABNORMAL LOW (ref 4.22–5.81)
RDW: 14.2 % (ref 11.5–15.5)
WBC: 3.4 10*3/uL — ABNORMAL LOW (ref 4.0–10.5)
nRBC: 0 % (ref 0.0–0.2)

## 2023-05-27 LAB — LIPASE, BLOOD: Lipase: 50 U/L (ref 11–51)

## 2023-05-27 LAB — LACTIC ACID, PLASMA: Lactic Acid, Venous: 0.9 mmol/L (ref 0.5–1.9)

## 2023-05-27 LAB — HEPATIC FUNCTION PANEL
ALT: 7 U/L (ref 0–44)
AST: 13 U/L — ABNORMAL LOW (ref 15–41)
Albumin: 3.2 g/dL — ABNORMAL LOW (ref 3.5–5.0)
Alkaline Phosphatase: 53 U/L (ref 38–126)
Bilirubin, Direct: 0.2 mg/dL (ref 0.0–0.2)
Indirect Bilirubin: 0.5 mg/dL (ref 0.3–0.9)
Total Bilirubin: 0.7 mg/dL (ref <1.2)
Total Protein: 5.6 g/dL — ABNORMAL LOW (ref 6.5–8.1)

## 2023-05-27 LAB — TSH: TSH: 2.941 u[IU]/mL (ref 0.350–4.500)

## 2023-05-27 LAB — AMMONIA: Ammonia: 56 umol/L — ABNORMAL HIGH (ref 9–35)

## 2023-05-27 LAB — MAGNESIUM: Magnesium: 1.8 mg/dL (ref 1.7–2.4)

## 2023-05-27 LAB — PHOSPHORUS: Phosphorus: 2.1 mg/dL — ABNORMAL LOW (ref 2.5–4.6)

## 2023-05-27 MED ORDER — HYDRALAZINE HCL 20 MG/ML IJ SOLN: 10.0000 mg | INTRAMUSCULAR | Status: DC | PRN

## 2023-05-27 MED ORDER — K PHOS MONO-SOD PHOS DI & MONO 155-852-130 MG PO TABS
500.0000 mg | ORAL_TABLET | Freq: Once | ORAL | Status: AC
  Administered 2023-05-27: 500 mg via ORAL
  Filled 2023-05-27: qty 2

## 2023-05-27 MED ORDER — LOPERAMIDE HCL 2 MG PO CAPS
4.0000 mg | ORAL_CAPSULE | Freq: Two times a day (BID) | ORAL | Status: DC
  Administered 2023-05-27: 4 mg via ORAL
  Filled 2023-05-27: qty 2

## 2023-05-27 MED ORDER — IPRATROPIUM-ALBUTEROL 0.5-2.5 (3) MG/3ML IN SOLN: 3.0000 mL | RESPIRATORY_TRACT | Status: DC | PRN

## 2023-05-27 MED ORDER — METOPROLOL TARTRATE 5 MG/5ML IV SOLN: 5.0000 mg | INTRAVENOUS | Status: DC | PRN

## 2023-05-27 MED ORDER — OLANZAPINE 5 MG PO TABS
20.0000 mg | ORAL_TABLET | Freq: Every day | ORAL | Status: DC
  Administered 2023-05-27: 20 mg via ORAL
  Filled 2023-05-27: qty 4

## 2023-05-27 MED ORDER — ONDANSETRON HCL 4 MG/2ML IJ SOLN: 4.0000 mg | Freq: Four times a day (QID) | INTRAMUSCULAR | Status: DC | PRN

## 2023-05-27 MED ORDER — LAMOTRIGINE 25 MG PO TABS
25.0000 mg | ORAL_TABLET | Freq: Two times a day (BID) | ORAL | Status: DC
  Administered 2023-05-27 – 2023-05-28 (×3): 25 mg via ORAL
  Filled 2023-05-27 (×3): qty 1

## 2023-05-27 MED ORDER — TRAZODONE HCL 50 MG PO TABS
50.0000 mg | ORAL_TABLET | Freq: Every evening | ORAL | Status: DC | PRN
  Administered 2023-05-27: 50 mg via ORAL
  Filled 2023-05-27: qty 1

## 2023-05-27 MED ORDER — LACTULOSE 10 GM/15ML PO SOLN
30.0000 g | Freq: Two times a day (BID) | ORAL | Status: DC
  Administered 2023-05-27 – 2023-05-28 (×3): 30 g via ORAL
  Filled 2023-05-27 (×3): qty 60

## 2023-05-27 MED ORDER — OLANZAPINE 5 MG PO TABS
10.0000 mg | ORAL_TABLET | Freq: Every day | ORAL | Status: DC
  Administered 2023-05-27: 10 mg via ORAL
  Filled 2023-05-27: qty 2

## 2023-05-27 NOTE — Care Management Important Message (Signed)
Important Message  Patient Details  Name: KASIR HEINL MRN: [id number] Date of Birth: [birth year]   Important Message Given:  N/A - LOS <3 / Initial given by admissions     Olegario Messier A Ladaysha Soutar 05/27/2023, 10:48 AM

## 2023-05-27 NOTE — Plan of Care (Signed)

## 2023-05-27 NOTE — Progress Notes (Signed)
PROGRESS NOTE    Derrick Atkins  ZOX:[id number] DOB: October 31, 1970 DOA: 05/25/2023 PCP: Koren Bound, NP     Brief hospital course:  52 year old male with history of dyslipidemia, chronic paranoid schizophrenia, chronic generalized weakness, pancytopenia, chronic thrombocytopenia, hypertension, who she emergency department from [facility] group home for chief concerns of intractable nausea and vomiting.  CT abdomen pelvis was unremarkable.  UDS negative for THC.       Assessment and Plan:   Intractable nausea and vomiting  Unknown etiology.  CT abdomen pelvis negative.  UDS is also overall unremarkable.  Currently on PPI Diet as tolerated.   Lactic acidosis  Resolved with IV fluids   AKI  Admission creatinine 1.8, resolved with IV fluids.  Now at baseline 0.8  Metabolic encephalopathy Elevated ammonia - EEG shows generalized slowing, MRI brain overall unremarkable.  Ammonia levels improving, continue lactulose.  TSH normal   Hypoglycemia, Resolved  Resolved  Hypophosphatemia - Repletion   Leukopenia  could be secondary to olanzapine.  Monitor   HTN and HLD Continue Lopressor, fenofibrate, gemfibrozil and Crestor IV as needed   Hypothyroid Continue Synthroid   Chronic paranoid schizophrenia Continue Zyprexa 30 mg p.o. nightly Continue supportive care   Iron deficiency  continue oral iron supplement with vit. C   Left kidney mass  incidental finding Follow with PCP as an outpatient for dedicated renal MRI to rule out malignancy.  Diet: Soft diet DVT Prophylaxis: Subcutaneous Lovenox  Advance goals of care discussion: Full code  Family Communication:   Disposition:  Continue hospital stay until mentation has been stable Eventually will return to group home   Subjective:  Seen at bedside, no complaints.  Mentation slowly improving   Physical Exam: Constitutional: Not in acute distress Respiratory: Clear to auscultation  bilaterally Cardiovascular: Normal sinus rhythm, no rubs Abdomen: Nontender nondistended good bowel sounds Musculoskeletal: No edema noted Skin: No rashes seen Neurologic: CN 2-12 grossly intact.  And nonfocal Psychiatric: Normal judgment and insight.  Slightly sluggish in his responses     Body mass index is 24.25 kg/m.  Interventions:   Pressure Injury 04/12/23 Sacrum Stage 2 -  Partial thickness loss of dermis presenting as a shallow open injury with a red, pink wound bed without slough. open area 2x2 to sacrum (Active)  04/12/23 2230  Location: Sacrum  Location Orientation:   Staging: Stage 2 -  Partial thickness loss of dermis presenting as a shallow open injury with a red, pink wound bed without slough.  Wound Description (Comments): open area 2x2 to sacrum  Present on Admission: Yes                    Diet Orders (From admission, onward)     Start     Ordered   05/26/23 0815  DIET SOFT Room service appropriate? Yes; Fluid consistency: Thin  Diet effective now       Question Answer Comment  Room service appropriate? Yes   Fluid consistency: Thin      05/26/23 0814            Objective: Vitals:   05/26/23 1547 05/26/23 2123 05/27/23 0018 05/27/23 0834  BP: 135/76 134/76 (!) 153/73 131/79  Pulse: 67 66 63 (!) 58  Resp: 16  18 16   Temp:   (!) 97.5 F (36.4 C) 97.9 F (36.6 C)  TempSrc:   Oral   SpO2: 100%  100% 100%  Weight:      Height:  Intake/Output Summary (Last 24 hours) at 05/27/2023 1341 Last data filed at 05/27/2023 1020 Gross per 24 hour  Intake 360 ml  Output 1750 ml  Net -1390 ml   Filed Weights   05/25/23 0840  Weight: 88 kg    Scheduled Meds:  ARIPiprazole ER  400 mg Intramuscular Q28 days   ascorbic acid  500 mg Oral Daily   aspirin EC  81 mg Oral Daily   benztropine  1 mg Oral Daily   cholecalciferol  1,000 Units Oral Daily   enoxaparin (LOVENOX) injection  40 mg Subcutaneous QPM   fenofibrate  54 mg Oral  Daily   folic acid  1 mg Oral Daily   gemfibrozil  600 mg Oral BID AC   iron polysaccharides  150 mg Oral Daily   lactulose  30 g Oral BID   lamoTRIgine  25 mg Oral BID   levothyroxine  88 mcg Oral Q0600   loperamide  4 mg Oral BID   metoprolol tartrate  25 mg Oral BID   OLANZapine  10 mg Oral QHS   OLANZapine  20 mg Oral QHS   pantoprazole  40 mg Oral BID   Followed by   Melene Muller ON 05/29/2023] pantoprazole  40 mg Oral Daily   polyethylene glycol  17 g Oral BID   rosuvastatin  5 mg Oral QHS   senna  2 tablet Oral QHS   Continuous Infusions:  Nutritional status     Body mass index is 24.25 kg/m.  Data Reviewed:   CBC: Recent Labs  Lab 05/24/23 0654 05/25/23 0846 05/25/23 1631 05/26/23 0605 05/27/23 0625  WBC 5.0 3.6*  --  3.0* 3.4*  NEUTROABS  --  2.2  --   --  1.5*  HGB 9.7* 9.7* 9.7* 8.5* 8.6*  HCT 29.9* 30.0* 28.8* 25.0* 25.4*  MCV 87.4 88.2  --  84.7 84.1  PLT 135* 126*  --  95* 91*   Basic Metabolic Panel: Recent Labs  Lab 05/24/23 0654 05/25/23 0846 05/26/23 0605 05/27/23 0625  NA 142 142 143 141  K 3.6 3.8 3.8 3.6  CL 108 107 112* 111  CO2 22 24 23 22   GLUCOSE 71 94 89 110*  BUN 36* 32* 24* 13  CREATININE 1.83* 1.60* 1.18 0.82  CALCIUM 9.2 9.2 8.7* 8.7*  MG  --  2.4 2.0 1.8  PHOS  --   --  2.9 2.1*   GFR: Estimated Creatinine Clearance: 127.4 mL/min (by C-G formula based on SCr of 0.82 mg/dL). Liver Function Tests: Recent Labs  Lab 05/24/23 0654 05/25/23 0846 05/27/23 0625  AST 17 14* 13*  ALT 10 9 7   ALKPHOS 53 51 53  BILITOT 0.9 0.8 0.7  PROT 6.9 7.0 5.6*  ALBUMIN 4.2 4.1 3.2*   Recent Labs  Lab 05/24/23 0654 05/25/23 0846 05/27/23 0625  LIPASE 26 64* 50   Recent Labs  Lab 05/25/23 0941 05/26/23 0605 05/27/23 0845  AMMONIA 52* 118* 56*   Coagulation Profile: No results for input(s): "INR", "PROTIME" in the last 168 hours. Cardiac Enzymes: No results for input(s): "CKTOTAL", "CKMB", "CKMBINDEX", "TROPONINI" in the last  168 hours. BNP (last 3 results) No results for input(s): "PROBNP" in the last 8760 hours. HbA1C: No results for input(s): "HGBA1C" in the last 72 hours. CBG: Recent Labs  Lab 05/25/23 0849 05/25/23 1606 05/26/23 0038  GLUCAP 91 120* 78   Lipid Profile: No results for input(s): "CHOL", "HDL", "LDLCALC", "TRIG", "CHOLHDL", "LDLDIRECT" in the last 72 hours.  Thyroid Function Tests: Recent Labs    05/27/23 0845  TSH 2.941   Anemia Panel: No results for input(s): "VITAMINB12", "FOLATE", "FERRITIN", "TIBC", "IRON", "RETICCTPCT" in the last 72 hours. Sepsis Labs: Recent Labs  Lab 05/25/23 1435 05/25/23 1631 05/26/23 1136 05/27/23 0625  PROCALCITON <0.10  --   --   --   LATICACIDVEN 2.9* 2.9* 2.0* 0.9    Recent Results (from the past 240 hour(s))  Resp panel by RT-PCR (RSV, Flu A&B, Covid) Anterior Nasal Swab     Status: None   Collection Time: 05/25/23  4:04 PM   Specimen: Anterior Nasal Swab  Result Value Ref Range Status   SARS Coronavirus 2 by RT PCR NEGATIVE NEGATIVE Final    Comment: (NOTE) SARS-CoV-2 target nucleic acids are NOT DETECTED.  The SARS-CoV-2 RNA is generally detectable in upper respiratory specimens during the acute phase of infection. The lowest concentration of SARS-CoV-2 viral copies this assay can detect is 138 copies/mL. A negative result does not preclude SARS-Cov-2 infection and should not be used as the sole basis for treatment or other patient management decisions. A negative result may occur with  improper specimen collection/handling, submission of specimen other than nasopharyngeal swab, presence of viral mutation(s) within the areas targeted by this assay, and inadequate number of viral copies(<138 copies/mL). A negative result must be combined with clinical observations, patient history, and epidemiological information. The expected result is Negative.  Fact Sheet for Patients:  BloggerCourse.com  Fact Sheet  for Healthcare Providers:  SeriousBroker.it  This test is no t yet approved or cleared by the [place] FDA and  has been authorized for detection and/or diagnosis of SARS-CoV-2 by FDA under an Emergency Use Authorization (EUA). This EUA will remain  in effect (meaning this test can be used) for the duration of the COVID-19 declaration under Section 564(b)(1) of the Act, 21 U.S.C.section 360bbb-3(b)(1), unless the authorization is terminated  or revoked sooner.       Influenza A by PCR NEGATIVE NEGATIVE Final   Influenza B by PCR NEGATIVE NEGATIVE Final    Comment: (NOTE) The Xpert Xpress SARS-CoV-2/FLU/RSV plus assay is intended as an aid in the diagnosis of influenza from Nasopharyngeal swab specimens and should not be used as a sole basis for treatment. Nasal washings and aspirates are unacceptable for Xpert Xpress SARS-CoV-2/FLU/RSV testing.  Fact Sheet for Patients: BloggerCourse.com  Fact Sheet for Healthcare Providers: SeriousBroker.it  This test is not yet approved or cleared by the [place] FDA and has been authorized for detection and/or diagnosis of SARS-CoV-2 by FDA under an Emergency Use Authorization (EUA). This EUA will remain in effect (meaning this test can be used) for the duration of the COVID-19 declaration under Section 564(b)(1) of the Act, 21 U.S.C. section 360bbb-3(b)(1), unless the authorization is terminated or revoked.     Resp Syncytial Virus by PCR NEGATIVE NEGATIVE Final    Comment: (NOTE) Fact Sheet for Patients: BloggerCourse.com  Fact Sheet for Healthcare Providers: SeriousBroker.it  This test is not yet approved or cleared by the [place] FDA and has been authorized for detection and/or diagnosis of SARS-CoV-2 by FDA under an Emergency Use Authorization (EUA). This EUA will remain in effect (meaning  this test can be used) for the duration of the COVID-19 declaration under Section 564(b)(1) of the Act, 21 U.S.C. section 360bbb-3(b)(1), unless the authorization is terminated or revoked.  Performed at [facility], [address] [place], [place] [address]   Respiratory (~20 pathogens) panel by  PCR     Status: None   Collection Time: 05/25/23  4:04 PM   Specimen: Anterior Nasal Swab; Respiratory  Result Value Ref Range Status   Adenovirus NOT DETECTED NOT DETECTED Final   Coronavirus 229E NOT DETECTED NOT DETECTED Final    Comment: (NOTE) The Coronavirus on the Respiratory Panel, DOES NOT test for the novel  Coronavirus (2019 nCoV)    Coronavirus HKU1 NOT DETECTED NOT DETECTED Final   Coronavirus NL63 NOT DETECTED NOT DETECTED Final   Coronavirus OC43 NOT DETECTED NOT DETECTED Final   Metapneumovirus NOT DETECTED NOT DETECTED Final   Rhinovirus / Enterovirus NOT DETECTED NOT DETECTED Final   Influenza A NOT DETECTED NOT DETECTED Final   Influenza B NOT DETECTED NOT DETECTED Final   Parainfluenza Virus 1 NOT DETECTED NOT DETECTED Final   Parainfluenza Virus 2 NOT DETECTED NOT DETECTED Final   Parainfluenza Virus 3 NOT DETECTED NOT DETECTED Final   Parainfluenza Virus 4 NOT DETECTED NOT DETECTED Final   Respiratory Syncytial Virus NOT DETECTED NOT DETECTED Final   Bordetella pertussis NOT DETECTED NOT DETECTED Final   Bordetella Parapertussis NOT DETECTED NOT DETECTED Final   Chlamydophila pneumoniae NOT DETECTED NOT DETECTED Final   Mycoplasma pneumoniae NOT DETECTED NOT DETECTED Final    Comment: Performed at [facility], [address], [place], [place] [address]  MRSA Next Gen by PCR, Nasal     Status: None   Collection Time: 05/25/23  4:04 PM   Specimen: Nasal Mucosa; Nasal Swab  Result Value Ref Range Status   MRSA by PCR Next Gen NOT DETECTED NOT DETECTED Final    Comment: (NOTE) The GeneXpert MRSA Assay (FDA approved for NASAL specimens only), is  one component of a comprehensive MRSA colonization surveillance program. It is not intended to diagnose MRSA infection nor to guide or monitor treatment for MRSA infections. Test performance is not FDA approved in patients less than 71 years old. Performed at [facility], [address] [place], [place] [address]   Culture, blood (Routine X 2) w Reflex to ID Panel     Status: None (Preliminary result)   Collection Time: 05/25/23  4:31 PM   Specimen: BLOOD  Result Value Ref Range Status   Specimen Description BLOOD BLOOD RIGHT HAND  Final   Special Requests   Final    BOTTLES DRAWN AEROBIC AND ANAEROBIC Blood Culture results may not be optimal due to an inadequate volume of blood received in culture bottles   Culture   Final    NO GROWTH 2 DAYS Performed at [facility], [address] [place], [place] [address]    Report Status PENDING  Incomplete  Culture, blood (Routine X 2) w Reflex to ID Panel     Status: None (Preliminary result)   Collection Time: 05/25/23  6:10 PM   Specimen: BLOOD  Result Value Ref Range Status   Specimen Description BLOOD BLOOD RIGHT ARM  Final   Special Requests   Final    BOTTLES DRAWN AEROBIC AND ANAEROBIC Blood Culture adequate volume   Culture   Final    NO GROWTH 2 DAYS Performed at [facility], [address], [place], [place] [address]    Report Status PENDING  Incomplete         Radiology Studies: EEG adult  Result Date: 2023-06-03 Charlsie Quest, MD     2023/06/03 11:46 AM Patient Name: Derrick Atkins MRN: [id number] Epilepsy Attending: Charlsie Quest Referring  Physician/Provider: Cox, Nadyne Coombes, DO Date: 05/27/2023 Duration: 33.31 mins Patient history: 52yo M with ams getting eeg to evaluate for seizure Level of alertness: Awake, asleep AEDs during EEG study: None Technical aspects: This EEG study was done with scalp electrodes positioned according to the 10-20 International system of electrode  placement. Electrical activity was reviewed with band pass filter of 1-70Hz , sensitivity of 7 uV/mm, display speed of 86mm/sec with a 60Hz  notched filter applied as appropriate. EEG data were recorded continuously and digitally stored.  Video monitoring was available and reviewed as appropriate. Description: The posterior dominant rhythm consists of 7.5 Hz activity of moderate voltage (25-35 uV) seen predominantly in posterior head regions, symmetric and reactive to eye opening and eye closing. Sleep was characterized by vertex waves, sleep spindles (12 to 14 Hz), maximal frontocentral region. EEG showed continuous generalized 5 to 7 Hz theta slowing. Hyperventilation and photic stimulation were not performed.   ABNORMALITY - Continuous slow, generalized IMPRESSION: This study is suggestive of mild to moderate diffuse encephalopathy. No seizures or epileptiform discharges were seen throughout the recording. Charlsie Quest   MR BRAIN WO CONTRAST  Result Date: 05/26/2023 CLINICAL DATA:  Neuro deficit with acute stroke suspected. Mental status change. EXAM: MRI HEAD WITHOUT CONTRAST TECHNIQUE: Multiplanar, multiecho pulse sequences of the brain and surrounding structures were obtained without intravenous contrast. COMPARISON:  Head CT from earlier today FINDINGS: Brain: No acute infarction, hemorrhage, hydrocephalus, extra-axial collection or mass lesion. Periventricular insult with gliosis and volume loss at the body of the left lateral ventricle. Generalized cerebral volume loss greater than expected for age. Vascular: Normal flow voids. Skull and upper cervical spine: Normal marrow signal. Sinuses/Orbits: Negative. IMPRESSION: 1. No acute finding. 2. Atrophy and remote left periventricular insult. Electronically Signed   By: Tiburcio Pea M.D.   On: 05/26/2023 06:25   DG Chest Port 1 View  Result Date: 05/25/2023 CLINICAL DATA:  Intractable nausea and vomiting. EXAM: PORTABLE CHEST 1 VIEW COMPARISON:   04/12/2023 and CT chest 09/23/2005. FINDINGS: Trachea is midline. Heart size normal. Lungs are somewhat low in volume with minimal left lower lobe subsegmental atelectasis. No dense airspace consolidation or pleural fluid. IMPRESSION: Low lung volumes with subsegmental left lower lobe atelectasis. Electronically Signed   By: Leanna Battles M.D.   On: 05/25/2023 16:21           LOS: 2 days   Time spent= 35 mins    Miguel Rota, MD [facility]  If 7PM-7AM, please contact night-coverage  05/27/2023, 1:41 PM

## 2023-05-27 NOTE — Progress Notes (Signed)
PHARMACY CONSULT NOTE - FOLLOW UP  Pharmacy Consult for Electrolyte Monitoring and Replacement   Recent Labs: Potassium (mmol/L)  Date Value  05/27/2023 3.6  12/14/2012 4.0   Magnesium (mg/dL)  Date Value  16/04/9603 1.8   Calcium (mg/dL)  Date Value  54/03/8118 8.7 (L)   Calcium, Total (mg/dL)  Date Value  14/78/2956 8.7   Albumin (g/dL)  Date Value  21/30/8657 3.2 (L)  12/14/2012 3.4   Phosphorus (mg/dL)  Date Value  84/69/6295 2.1 (L)   Sodium (mmol/L)  Date Value  05/27/2023 141  12/14/2012 139     Assessment: 52 year old male admitted with intractable nausea and vomiting and AKI. PMH includes dyslipidemia, chronic paranoid schizophrenia, chronic generalized weakness, pancytopenia, chronic thrombocytopenia, hypertension.  Goal of Therapy:  Electrolytes within normal limits  Plan:  Phos 2.1. Kphos Neutral PO 500mg  x 1 dose ordered by medical team.  Follow up BMP, Phos, Mag tomorrow AM   Elliot Gurney, PharmD, BCPS Clinical Pharmacist  05/27/2023 8:37 AM

## 2023-05-27 NOTE — Progress Notes (Signed)
PROGRESS NOTE    TAWAN TABET  OZD:[id number] DOB: 07-22-1970 DOA: 05/25/2023 PCP: Koren Bound, NP     Brief hospital course:  52 year old male with history of dyslipidemia, chronic paranoid schizophrenia, chronic generalized weakness, pancytopenia, chronic thrombocytopenia, hypertension, who she emergency department from [facility] group [facility] for chief concerns of intractable nausea and vomiting.  CT abdomen pelvis was unremarkable.  UDS negative for THC.       Assessment and Plan:   Intractable nausea and vomiting  Unknown etiology.  CT abdomen pelvis negative.  UDS is also overall unremarkable.  Currently on PPI Diet as tolerated.   Lactic acidosis  Resolved with IV fluids   AKI  Admission creatinine 1.8, resolved with IV fluids.  Now at baseline 0.8  Metabolic encephalopathy Elevated ammonia - EEG shows generalized slowing, MRI brain overall unremarkable.  Ammonia levels improving, continue lactulose.  TSH normal   Hypoglycemia, Resolved  Resolved  Hypophosphatemia - Repletion   Leukopenia  could be secondary to olanzapine.  Monitor   HTN and HLD Continue Lopressor, fenofibrate, gemfibrozil and Crestor IV as needed   Hypothyroid Continue Synthroid   Chronic paranoid schizophrenia Continue Zyprexa 10 mg p.o. nightly Continue supportive care   Iron deficiency  continue oral iron supplement with vit. C   Left kidney mass  incidental finding Follow with PCP as an outpatient for dedicated renal MRI to rule out malignancy.  Diet: Soft diet DVT Prophylaxis: Subcutaneous Lovenox  Advance goals of care discussion: Full code  Family Communication:   Disposition:  Continue hospital stay until mentation has been stable Eventually will return to group home   Subjective:  Seen at bedside, no complaints.  Mentation slowly improving   Physical Exam: Constitutional: Not in acute distress Respiratory: Clear to auscultation  bilaterally Cardiovascular: Normal sinus rhythm, no rubs Abdomen: Nontender nondistended good bowel sounds Musculoskeletal: No edema noted Skin: No rashes seen Neurologic: CN 2-12 grossly intact.  And nonfocal Psychiatric: Normal judgment and insight.  Slightly sluggish in his responses     Body mass index is 24.25 kg/m.  Interventions:   Pressure Injury 04/12/23 Sacrum Stage 2 -  Partial thickness loss of dermis presenting as a shallow open injury with a red, pink wound bed without slough. open area 2x2 to sacrum (Active)  04/12/23 2230  Location: Sacrum  Location Orientation:   Staging: Stage 2 -  Partial thickness loss of dermis presenting as a shallow open injury with a red, pink wound bed without slough.  Wound Description (Comments): open area 2x2 to sacrum  Present on Admission: Yes                    Diet Orders (From admission, onward)     Start     Ordered   05/26/23 0815  DIET SOFT Room service appropriate? Yes; Fluid consistency: Thin  Diet effective now       Question Answer Comment  Room service appropriate? Yes   Fluid consistency: Thin      05/26/23 0814            Objective: Vitals:   05/26/23 1547 05/26/23 2123 05/27/23 0018 05/27/23 0834  BP: 135/76 134/76 (!) 153/73 131/79  Pulse: 67 66 63 (!) 58  Resp: 16  18 16   Temp:   (!) 97.5 F (36.4 C) 97.9 F (36.6 C)  TempSrc:   Oral   SpO2: 100%  100% 100%  Weight:      Height:  Intake/Output Summary (Last 24 hours) at 05/27/2023 1307 Last data filed at 05/27/2023 1020 Gross per 24 hour  Intake 360 ml  Output 1750 ml  Net -1390 ml   Filed Weights   05/25/23 0840  Weight: 88 kg    Scheduled Meds:  ARIPiprazole ER  400 mg Intramuscular Q28 days   ascorbic acid  500 mg Oral Daily   aspirin EC  81 mg Oral Daily   benztropine  1 mg Oral Daily   cholecalciferol  1,000 Units Oral Daily   enoxaparin (LOVENOX) injection  40 mg Subcutaneous QPM   fenofibrate  54 mg Oral  Daily   folic acid  1 mg Oral Daily   gemfibrozil  600 mg Oral BID AC   iron polysaccharides  150 mg Oral Daily   lactulose  30 g Oral BID   levothyroxine  88 mcg Oral Q0600   metoprolol tartrate  25 mg Oral BID   OLANZapine  10 mg Oral QHS   pantoprazole  40 mg Oral BID   Followed by   Melene Muller ON 05/29/2023] pantoprazole  40 mg Oral Daily   polyethylene glycol  17 g Oral BID   rosuvastatin  5 mg Oral QHS   senna  2 tablet Oral QHS   Continuous Infusions:  Nutritional status     Body mass index is 24.25 kg/m.  Data Reviewed:   CBC: Recent Labs  Lab 05/24/23 0654 05/25/23 0846 05/25/23 1631 05/26/23 0605 05/27/23 0625  WBC 5.0 3.6*  --  3.0* 3.4*  NEUTROABS  --  2.2  --   --  1.5*  HGB 9.7* 9.7* 9.7* 8.5* 8.6*  HCT 29.9* 30.0* 28.8* 25.0* 25.4*  MCV 87.4 88.2  --  84.7 84.1  PLT 135* 126*  --  95* 91*   Basic Metabolic Panel: Recent Labs  Lab 05/24/23 0654 05/25/23 0846 05/26/23 0605 05/27/23 0625  NA 142 142 143 141  K 3.6 3.8 3.8 3.6  CL 108 107 112* 111  CO2 22 24 23 22   GLUCOSE 71 94 89 110*  BUN 36* 32* 24* 13  CREATININE 1.83* 1.60* 1.18 0.82  CALCIUM 9.2 9.2 8.7* 8.7*  MG  --  2.4 2.0 1.8  PHOS  --   --  2.9 2.1*   GFR: Estimated Creatinine Clearance: 127.4 mL/min (by C-G formula based on SCr of 0.82 mg/dL). Liver Function Tests: Recent Labs  Lab 05/24/23 0654 05/25/23 0846 05/27/23 0625  AST 17 14* 13*  ALT 10 9 7   ALKPHOS 53 51 53  BILITOT 0.9 0.8 0.7  PROT 6.9 7.0 5.6*  ALBUMIN 4.2 4.1 3.2*   Recent Labs  Lab 05/24/23 0654 05/25/23 0846 05/27/23 0625  LIPASE 26 64* 50   Recent Labs  Lab 05/25/23 0941 05/26/23 0605 05/27/23 0845  AMMONIA 52* 118* 56*   Coagulation Profile: No results for input(s): "INR", "PROTIME" in the last 168 hours. Cardiac Enzymes: No results for input(s): "CKTOTAL", "CKMB", "CKMBINDEX", "TROPONINI" in the last 168 hours. BNP (last 3 results) No results for input(s): "PROBNP" in the last 8760  hours. HbA1C: No results for input(s): "HGBA1C" in the last 72 hours. CBG: Recent Labs  Lab 05/25/23 0849 05/25/23 1606 05/26/23 0038  GLUCAP 91 120* 78   Lipid Profile: No results for input(s): "CHOL", "HDL", "LDLCALC", "TRIG", "CHOLHDL", "LDLDIRECT" in the last 72 hours. Thyroid Function Tests: Recent Labs    05/27/23 0845  TSH 2.941   Anemia Panel: No results for input(s): "VITAMINB12", "FOLATE", "FERRITIN", "  TIBC", "IRON", "RETICCTPCT" in the last 72 hours. Sepsis Labs: Recent Labs  Lab 05/25/23 1435 05/25/23 1631 05/26/23 1136 05/27/23 0625  PROCALCITON <0.10  --   --   --   LATICACIDVEN 2.9* 2.9* 2.0* 0.9    Recent Results (from the past 240 hour(s))  Resp panel by RT-PCR (RSV, Flu A&B, Covid) Anterior Nasal Swab     Status: None   Collection Time: 05/25/23  4:04 PM   Specimen: Anterior Nasal Swab  Result Value Ref Range Status   SARS Coronavirus 2 by RT PCR NEGATIVE NEGATIVE Final    Comment: (NOTE) SARS-CoV-2 target nucleic acids are NOT DETECTED.  The SARS-CoV-2 RNA is generally detectable in upper respiratory specimens during the acute phase of infection. The lowest concentration of SARS-CoV-2 viral copies this assay can detect is 138 copies/mL. A negative result does not preclude SARS-Cov-2 infection and should not be used as the sole basis for treatment or other patient management decisions. A negative result may occur with  improper specimen collection/handling, submission of specimen other than nasopharyngeal swab, presence of viral mutation(s) within the areas targeted by this assay, and inadequate number of viral copies(<138 copies/mL). A negative result must be combined with clinical observations, patient history, and epidemiological information. The expected result is Negative.  Fact Sheet for Patients:  BloggerCourse.com  Fact Sheet for Healthcare Providers:  SeriousBroker.it  This test is  no t yet approved or cleared by the [place] FDA and  has been authorized for detection and/or diagnosis of SARS-CoV-2 by FDA under an Emergency Use Authorization (EUA). This EUA will remain  in effect (meaning this test can be used) for the duration of the COVID-19 declaration under Section 564(b)(1) of the Act, 21 U.S.C.section 360bbb-3(b)(1), unless the authorization is terminated  or revoked sooner.       Influenza A by PCR NEGATIVE NEGATIVE Final   Influenza B by PCR NEGATIVE NEGATIVE Final    Comment: (NOTE) The Xpert Xpress SARS-CoV-2/FLU/RSV plus assay is intended as an aid in the diagnosis of influenza from Nasopharyngeal swab specimens and should not be used as a sole basis for treatment. Nasal washings and aspirates are unacceptable for Xpert Xpress SARS-CoV-2/FLU/RSV testing.  Fact Sheet for Patients: BloggerCourse.com  Fact Sheet for Healthcare Providers: SeriousBroker.it  This test is not yet approved or cleared by the [place] FDA and has been authorized for detection and/or diagnosis of SARS-CoV-2 by FDA under an Emergency Use Authorization (EUA). This EUA will remain in effect (meaning this test can be used) for the duration of the COVID-19 declaration under Section 564(b)(1) of the Act, 21 U.S.C. section 360bbb-3(b)(1), unless the authorization is terminated or revoked.     Resp Syncytial Virus by PCR NEGATIVE NEGATIVE Final    Comment: (NOTE) Fact Sheet for Patients: BloggerCourse.com  Fact Sheet for Healthcare Providers: SeriousBroker.it  This test is not yet approved or cleared by the [place] FDA and has been authorized for detection and/or diagnosis of SARS-CoV-2 by FDA under an Emergency Use Authorization (EUA). This EUA will remain in effect (meaning this test can be used) for the duration of the COVID-19 declaration under Section  564(b)(1) of the Act, 21 U.S.C. section 360bbb-3(b)(1), unless the authorization is terminated or revoked.  Performed at [facility], [address] [place], [place] [address]   Respiratory (~20 pathogens) panel by PCR     Status: None   Collection Time: 05/25/23  4:04 PM   Specimen: Anterior Nasal Swab; Respiratory  Result  Value Ref Range Status   Adenovirus NOT DETECTED NOT DETECTED Final   Coronavirus 229E NOT DETECTED NOT DETECTED Final    Comment: (NOTE) The Coronavirus on the Respiratory Panel, DOES NOT test for the novel  Coronavirus (2019 nCoV)    Coronavirus HKU1 NOT DETECTED NOT DETECTED Final   Coronavirus NL63 NOT DETECTED NOT DETECTED Final   Coronavirus OC43 NOT DETECTED NOT DETECTED Final   Metapneumovirus NOT DETECTED NOT DETECTED Final   Rhinovirus / Enterovirus NOT DETECTED NOT DETECTED Final   Influenza A NOT DETECTED NOT DETECTED Final   Influenza B NOT DETECTED NOT DETECTED Final   Parainfluenza Virus 1 NOT DETECTED NOT DETECTED Final   Parainfluenza Virus 2 NOT DETECTED NOT DETECTED Final   Parainfluenza Virus 3 NOT DETECTED NOT DETECTED Final   Parainfluenza Virus 4 NOT DETECTED NOT DETECTED Final   Respiratory Syncytial Virus NOT DETECTED NOT DETECTED Final   Bordetella pertussis NOT DETECTED NOT DETECTED Final   Bordetella Parapertussis NOT DETECTED NOT DETECTED Final   Chlamydophila pneumoniae NOT DETECTED NOT DETECTED Final   Mycoplasma pneumoniae NOT DETECTED NOT DETECTED Final    Comment: Performed at [facility], [address], [place], [place] [address]  MRSA Next Gen by PCR, Nasal     Status: None   Collection Time: 05/25/23  4:04 PM   Specimen: Nasal Mucosa; Nasal Swab  Result Value Ref Range Status   MRSA by PCR Next Gen NOT DETECTED NOT DETECTED Final    Comment: (NOTE) The GeneXpert MRSA Assay (FDA approved for NASAL specimens only), is one component of a comprehensive MRSA colonization surveillance program. It is not  intended to diagnose MRSA infection nor to guide or monitor treatment for MRSA infections. Test performance is not FDA approved in patients less than 76 years old. Performed at [facility], [address] [place], [place] [address]   Culture, blood (Routine X 2) w Reflex to ID Panel     Status: None (Preliminary result)   Collection Time: 05/25/23  4:31 PM   Specimen: BLOOD  Result Value Ref Range Status   Specimen Description BLOOD BLOOD RIGHT HAND  Final   Special Requests   Final    BOTTLES DRAWN AEROBIC AND ANAEROBIC Blood Culture results may not be optimal due to an inadequate volume of blood received in culture bottles   Culture   Final    NO GROWTH 2 DAYS Performed at [name], [address] [place], [place] [address]    Report Status PENDING  Incomplete  Culture, blood (Routine X 2) w Reflex to ID Panel     Status: None (Preliminary result)   Collection Time: 05/25/23  6:10 PM   Specimen: BLOOD  Result Value Ref Range Status   Specimen Description BLOOD BLOOD RIGHT ARM  Final   Special Requests   Final    BOTTLES DRAWN AEROBIC AND ANAEROBIC Blood Culture adequate volume   Culture   Final    NO GROWTH 2 DAYS Performed at [facility], [address], [place], [place] [address]    Report Status PENDING  Incomplete         Radiology Studies: EEG adult  Result Date: 04-Jun-2023 Charlsie Quest, MD     June 04, 2023 11:46 AM Patient Name: MAHENDER SLIDER MRN: [id number] Epilepsy Attending: Charlsie Quest Referring Physician/Provider: Lovenia Kim, DO Date: 04-Jun-2023 Duration: 33.31 mins Patient history: 52yo M with ams getting eeg to evaluate for seizure Level of  alertness: Awake, asleep AEDs during EEG study: None Technical aspects: This EEG study was done with scalp electrodes positioned according to the 10-20 International system of electrode placement. Electrical activity was reviewed with band pass filter of 1-70Hz , sensitivity of 7  uV/mm, display speed of 53mm/sec with a 60Hz  notched filter applied as appropriate. EEG data were recorded continuously and digitally stored.  Video monitoring was available and reviewed as appropriate. Description: The posterior dominant rhythm consists of 7.5 Hz activity of moderate voltage (25-35 uV) seen predominantly in posterior head regions, symmetric and reactive to eye opening and eye closing. Sleep was characterized by vertex waves, sleep spindles (12 to 14 Hz), maximal frontocentral region. EEG showed continuous generalized 5 to 7 Hz theta slowing. Hyperventilation and photic stimulation were not performed.   ABNORMALITY - Continuous slow, generalized IMPRESSION: This study is suggestive of mild to moderate diffuse encephalopathy. No seizures or epileptiform discharges were seen throughout the recording. Charlsie Quest   MR BRAIN WO CONTRAST  Result Date: 05/26/2023 CLINICAL DATA:  Neuro deficit with acute stroke suspected. Mental status change. EXAM: MRI HEAD WITHOUT CONTRAST TECHNIQUE: Multiplanar, multiecho pulse sequences of the brain and surrounding structures were obtained without intravenous contrast. COMPARISON:  Head CT from earlier today FINDINGS: Brain: No acute infarction, hemorrhage, hydrocephalus, extra-axial collection or mass lesion. Periventricular insult with gliosis and volume loss at the body of the left lateral ventricle. Generalized cerebral volume loss greater than expected for age. Vascular: Normal flow voids. Skull and upper cervical spine: Normal marrow signal. Sinuses/Orbits: Negative. IMPRESSION: 1. No acute finding. 2. Atrophy and remote left periventricular insult. Electronically Signed   By: Tiburcio Pea M.D.   On: 05/26/2023 06:25   DG Chest Port 1 View  Result Date: 05/25/2023 CLINICAL DATA:  Intractable nausea and vomiting. EXAM: PORTABLE CHEST 1 VIEW COMPARISON:  04/12/2023 and CT chest 09/23/2005. FINDINGS: Trachea is midline. Heart size normal. Lungs are  somewhat low in volume with minimal left lower lobe subsegmental atelectasis. No dense airspace consolidation or pleural fluid. IMPRESSION: Low lung volumes with subsegmental left lower lobe atelectasis. Electronically Signed   By: Leanna Battles M.D.   On: 05/25/2023 16:21           LOS: 2 days   Time spent= 35 mins    Miguel Rota, MD [facility]  If 7PM-7AM, please contact night-coverage  05/27/2023, 1:07 PM

## 2023-05-27 NOTE — Progress Notes (Signed)
Eeg done 

## 2023-05-27 NOTE — TOC Progression Note (Signed)
Transition of Care [facility]) - Progression Note    Patient Details  Name: Derrick Atkins MRN: [id number] Date of Birth: August 02, 1970  Transition of Care [facility]) CM/SW Contact  Marlowe Sax, RN Phone Number: 05/27/2023, 10:52 AM  Clinical Narrative:    Received a call from Briana from the group home, she needed to return the call to the pharmacist to review his meds, I provided her with the pharmacy number Braiana from the group home is who will need to be contacted at DC, her number is [phone]   Expected Discharge Plan: Home w Home Health Services Barriers to Discharge: No Barriers Identified  Expected Discharge Plan and Services   Discharge Planning Services: CM Consult   Living arrangements for the past 2 months: Group Home                 DME Arranged: N/A DME Agency: NA         HH Agency: [facility] Date [facility] Agency Contacted: 05/26/23 Time HH Agency Contacted: 1649 Representative spoke with at [facility] Agency: cheryl   Social Determinants of Health (SDOH) Interventions SDOH Screenings   Food Insecurity: No Food Insecurity (05/25/2023)  Housing: Low Risk  (05/25/2023)  Transportation Needs: No Transportation Needs (05/25/2023)  Recent Concern: Transportation Needs - Unmet Transportation Needs (04/14/2023)  Utilities: Not At Risk (05/25/2023)  Alcohol Screen: Low Risk  (03/23/2022)  Tobacco Use: Low Risk  (05/25/2023)    Readmission Risk Interventions    04/15/2023   11:03 AM  Readmission Risk Prevention Plan  Transportation Screening Complete  HRI or Home Care Consult Complete  Social Work Consult for Recovery Care Planning/Counseling Complete  Medication Review Oceanographer) Complete

## 2023-05-28 ENCOUNTER — Other Ambulatory Visit: Payer: Self-pay

## 2023-05-28 ENCOUNTER — Emergency Department
Admission: EM | Admit: 2023-05-28 | Discharge: 2023-05-29 | Disposition: A | Discharge status: Home / Self Care | Payer: 59 | Attending: Emergency Medicine | Admitting: Emergency Medicine

## 2023-05-28 DIAGNOSIS — N179 Acute kidney failure, unspecified: Secondary | ICD-10-CM | POA: Diagnosis present

## 2023-05-28 DIAGNOSIS — F200 Paranoid schizophrenia: Secondary | ICD-10-CM | POA: Diagnosis not present

## 2023-05-28 DIAGNOSIS — R112 Nausea with vomiting, unspecified: Secondary | ICD-10-CM | POA: Insufficient documentation

## 2023-05-28 DIAGNOSIS — R4689 Other symptoms and signs involving appearance and behavior: Secondary | ICD-10-CM | POA: Diagnosis present

## 2023-05-28 DIAGNOSIS — F29 Unspecified psychosis not due to a substance or known physiological condition: Secondary | ICD-10-CM | POA: Diagnosis not present

## 2023-05-28 DIAGNOSIS — R443 Hallucinations, unspecified: Secondary | ICD-10-CM

## 2023-05-28 LAB — CBC
HCT: 24.6 % — ABNORMAL LOW (ref 39.0–52.0)
HCT: 28.5 % — ABNORMAL LOW (ref 39.0–52.0)
Hemoglobin: 8.5 g/dL — ABNORMAL LOW (ref 13.0–17.0)
Hemoglobin: 9.6 g/dL — ABNORMAL LOW (ref 13.0–17.0)
MCH: 28.9 pg (ref 26.0–34.0)
MCH: 28.8 pg (ref 26.0–34.0)
MCHC: 34.6 g/dL (ref 30.0–36.0)
MCHC: 33.7 g/dL (ref 30.0–36.0)
MCV: 83.7 fL (ref 80.0–100.0)
MCV: 85.6 fL (ref 80.0–100.0)
Platelets: 107 10*3/uL — ABNORMAL LOW (ref 150–400)
Platelets: 135 10*3/uL — ABNORMAL LOW (ref 150–400)
RBC: 2.94 MIL/uL — ABNORMAL LOW (ref 4.22–5.81)
RBC: 3.33 MIL/uL — ABNORMAL LOW (ref 4.22–5.81)
RDW: 14.3 % (ref 11.5–15.5)
RDW: 14.3 % (ref 11.5–15.5)
WBC: 4 10*3/uL (ref 4.0–10.5)
WBC: 5.7 10*3/uL (ref 4.0–10.5)
nRBC: 0 % (ref 0.0–0.2)
nRBC: 0 % (ref 0.0–0.2)

## 2023-05-28 LAB — BASIC METABOLIC PANEL
Anion gap: 8 (ref 5–15)
BUN: 9 mg/dL (ref 6–20)
CO2: 23 mmol/L (ref 22–32)
Calcium: 8.6 mg/dL — ABNORMAL LOW (ref 8.9–10.3)
Chloride: 110 mmol/L (ref 98–111)
Creatinine, Ser: 0.91 mg/dL (ref 0.61–1.24)
GFR, Estimated: >60 mL/min (ref >60)
Glucose, Bld: 125 mg/dL — ABNORMAL HIGH (ref 70–99)
Potassium: 3.6 mmol/L (ref 3.5–5.1)
Sodium: 141 mmol/L (ref 135–145)

## 2023-05-28 LAB — COMPREHENSIVE METABOLIC PANEL
ALT: 9 U/L (ref 0–44)
AST: 15 U/L (ref 15–41)
Albumin: 3.6 g/dL (ref 3.5–5.0)
Alkaline Phosphatase: 58 U/L (ref 38–126)
Anion gap: 6 (ref 5–15)
BUN: 11 mg/dL (ref 6–20)
CO2: 24 mmol/L (ref 22–32)
Calcium: 8.7 mg/dL — ABNORMAL LOW (ref 8.9–10.3)
Chloride: 109 mmol/L (ref 98–111)
Creatinine, Ser: 0.93 mg/dL (ref 0.61–1.24)
GFR, Estimated: >60 mL/min (ref >60)
Glucose, Bld: 128 mg/dL — ABNORMAL HIGH (ref 70–99)
Potassium: 3.9 mmol/L (ref 3.5–5.1)
Sodium: 139 mmol/L (ref 135–145)
Total Bilirubin: 0.7 mg/dL (ref <1.2)
Total Protein: 6.3 g/dL — ABNORMAL LOW (ref 6.5–8.1)

## 2023-05-28 LAB — ACETAMINOPHEN LEVEL: Acetaminophen (Tylenol), Serum: <10 ug/mL — ABNORMAL LOW (ref 10–30)

## 2023-05-28 LAB — ETHANOL: Alcohol, Ethyl (B): <10 mg/dL (ref <10)

## 2023-05-28 LAB — MAGNESIUM: Magnesium: 1.9 mg/dL (ref 1.7–2.4)

## 2023-05-28 LAB — SALICYLATE LEVEL: Salicylate Lvl: <7 mg/dL — ABNORMAL LOW (ref 7.0–30.0)

## 2023-05-28 LAB — PHOSPHORUS: Phosphorus: 1.7 mg/dL — ABNORMAL LOW (ref 2.5–4.6)

## 2023-05-28 MED ORDER — K PHOS MONO-SOD PHOS DI & MONO 155-852-130 MG PO TABS
500.0000 mg | ORAL_TABLET | ORAL | Status: DC
  Administered 2023-05-28: 500 mg via ORAL
  Filled 2023-05-28: qty 2

## 2023-05-28 MED ORDER — PANTOPRAZOLE SODIUM 40 MG PO TBEC: 40.0000 mg | DELAYED_RELEASE_TABLET | Freq: Every day | ORAL | 0 refills | Status: DC

## 2023-05-28 NOTE — Plan of Care (Signed)

## 2023-05-28 NOTE — BH Assessment (Signed)
This Clinical research associate spoke with Derrick Atkins [phone] who reported that transportation will not be available to pick the pt up until after 8 AM 05/29/23.

## 2023-05-28 NOTE — TOC Progression Note (Signed)
Transition of Care [facility]) - Progression Note    Patient Details  Name: Derrick Atkins MRN: [id number] Date of Birth: [birth year]  Transition [facility] Care [facility]) CM/SW Contact  Ashley Royalty Lutricia Feil, RN Phone Number: 05/28/2023, 8:48 AM  Clinical Narrative:     TOC left message with Candice (Guardian) concerning pt's d/c today back to the group home. Also contacted the group home  and left a message for Shirlean Mylar [phone] requesting pick for transport.  Addendum: Arrangements made for transport to the group home with a taxi arranged by the group home. Floor nurse will send the discharge summary as requested.   TOC will continue to follow up according. Team is aware of this delay.   Expected Discharge Plan: Home w Home Health Services Barriers to Discharge: No Barriers Identified  Expected Discharge Plan and Services   Discharge Planning Services: CM Consult   Living arrangements for the past 2 months: Group Home                 DME Arranged: N/A DME Agency: NA         HH Agency: [facility] Date [facility] Agency Contacted: 05/26/23 Time HH Agency Contacted: 1649 Representative spoke with at [facility] Agency: cheryl   Social Determinants of Health (SDOH) Interventions SDOH Screenings   Food Insecurity: No Food Insecurity (05/25/2023)  Housing: Low Risk  (05/25/2023)  Transportation Needs: No Transportation Needs (05/25/2023)  Recent Concern: Transportation Needs - Unmet Transportation Needs (04/14/2023)  Utilities: Not At Risk (05/25/2023)  Alcohol Screen: Low Risk  (03/23/2022)  Tobacco Use: Low Risk  (05/25/2023)    Readmission Risk Interventions    04/15/2023   11:03 AM  Readmission Risk Prevention Plan  Transportation Screening Complete  HRI or Home Care Consult Complete  Social Work Consult for Recovery Care Planning/Counseling Complete  Medication Review Oceanographer) Complete

## 2023-05-28 NOTE — Progress Notes (Signed)
PHARMACY CONSULT NOTE - FOLLOW UP  Pharmacy Consult for Electrolyte Monitoring and Replacement   Recent Labs: Potassium (mmol/L)  Date Value  05/28/2023 3.6  12/14/2012 4.0   Magnesium (mg/dL)  Date Value  95/63/8756 1.9   Calcium (mg/dL)  Date Value  43/32/9518 8.6 (L)   Calcium, Total (mg/dL)  Date Value  84/16/6063 8.7   Albumin (g/dL)  Date Value  01/60/1093 3.2 (L)  12/14/2012 3.4   Phosphorus (mg/dL)  Date Value  23/55/7322 1.7 (L)   Sodium (mmol/L)  Date Value  05/28/2023 141  12/14/2012 139     Assessment: 52 year old male admitted with intractable nausea and vomiting and AKI. PMH includes dyslipidemia, chronic paranoid schizophrenia, chronic generalized weakness, pancytopenia, chronic thrombocytopenia, hypertension.  Diet: Soft  Goal of Therapy:  Electrolytes within normal limits  Plan:  Phos 1.7, K 3.6   Will order Kphos Neutral PO 500mg  po x 2  Follow up BMP, Phos, Mag tomorrow AM   Bari Mantis PharmD Clinical Pharmacist 05/28/2023  8:37 AM

## 2023-05-28 NOTE — ED Triage Notes (Signed)
Pt to ed from group home on [address] for :"well check". Per EMS pt states he is having hallucinations and seeing and hearing things.

## 2023-05-28 NOTE — Discharge Summary (Signed)
Physician Discharge Summary  Derrick Atkins:[id number] DOB: 30-Aug-1970 DOA: 05/25/2023  PCP: Derrick Bound, NP  Admit date: 05/25/2023 Discharge date: 05/28/2023  Admitted From: Group home Disposition:  Group home  Recommendations for Outpatient Follow-up:  Follow up with PCP in 1-2 weeks Please obtain BMP/CBC in one week your next doctors visit.  PPI daily   Discharge Condition: Stable CODE STATUS: Full Diet recommendation: Regular    Brief hospital course:  52 year old male with history of dyslipidemia, chronic paranoid schizophrenia, chronic generalized weakness, pancytopenia, chronic thrombocytopenia, hypertension, who she emergency department from [facility] group home for chief concerns of intractable nausea and vomiting.  CT abdomen pelvis was unremarkable.  UDS negative for THC.  During hospitalization slowly nausea and vomiting resolved Encephalopathy Improved As Well As Patient Received Lactulose for Elevated Ammonia Levels.  Patient Was Tolerating P.O. without Any Issues.  Electrolytes Were Repleted.  Eventually Was Determined to Transition Patient Back to His Group Home As He Remained Clinically Stable.       Assessment and Plan:   Intractable nausea and vomiting  Unknown etiology.  CT abdomen pelvis negative.  UDS is also overall unremarkable.  Currently on PPI Diet as tolerated.   Lactic acidosis  Resolved with IV fluids   AKI  Admission creatinine 1.8, resolved with IV fluids.  Now at baseline 0.8  Metabolic encephalopathy Elevated ammonia - EEG shows generalized slowing, MRI brain overall unremarkable.  Ammonia levels improved with lactulose.    Hypoglycemia, Resolved  Resolved  Hypophosphatemia/hypokalemia - Repletion   Leukopenia  could be secondary to olanzapine.  Monitor   HTN and HLD Continue Lopressor, fenofibrate, gemfibrozil and Crestor    Hypothyroid Continue Synthroid   Chronic paranoid schizophrenia Continue Zyprexa  30 mg p.o. nightly Continue supportive care   Iron deficiency  continue oral iron supplement with vit. C   Left kidney mass  incidental finding Follow with PCP as an outpatient for dedicated renal MRI to rule out malignancy.  Diet: Soft diet DVT Prophylaxis: Subcutaneous Lovenox  Advance goals of care discussion: Full code  Family Communication:   Disposition:  Hopefully return to group home today   Subjective:  No complaints, doing well Sitting up in the recliner.    Physical Exam: Constitutional: Not in acute distress Respiratory: Clear to auscultation bilaterally Cardiovascular: Normal sinus rhythm, no rubs Abdomen: Nontender nondistended good bowel sounds Musculoskeletal: No edema noted Skin: No rashes seen Neurologic: CN 2-12 grossly intact.  And nonfocal Psychiatric: Normal judgment and insight.  Slightly sluggish in his responses     Body mass index is 24.25 kg/m.  Interventions:   Pressure Injury 04/12/23 Sacrum Stage 2 -  Partial thickness loss of dermis presenting as a shallow open injury with a red, pink wound bed without slough. open area 2x2 to sacrum (Active)  04/12/23 2230  Location: Sacrum  Location Orientation:   Staging: Stage 2 -  Partial thickness loss of dermis presenting as a shallow open injury with a red, pink wound bed without slough.  Wound Description (Comments): open area 2x2 to sacrum  Present on Admission: Yes        Discharge Diagnoses:  Principal Problem:   Intractable nausea and vomiting Active Problems:   AKI (acute kidney injury) (HCC)   Hypoglycemia   Leukopenia   Essential hypertension   Thrombocytopenia (HCC)   Schizophrenia, paranoid, chronic (HCC)   Hypothyroidism   Iron deficiency anemia   Hyperlipidemia   Type 2 diabetes mellitus without complications (HCC)  Ambulatory dysfunction   Normocytic anemia   Right sided weakness   Acute encephalopathy       Discharge Exam: Vitals:   05/27/23 2312 05/28/23  0825  BP: 124/80 128/73  Pulse: 76 73  Resp: 18 16  Temp: 97.9 F (36.6 C) 97.8 F (36.6 C)  SpO2: 100% 100%   Vitals:   05/27/23 1719 05/27/23 2113 05/27/23 2312 05/28/23 0825  BP: 117/84 137/77 124/80 128/73  Pulse: 72 77 76 73  Resp: 18  18 16   Temp: 98 F (36.7 C)  97.9 F (36.6 C) 97.8 F (36.6 C)  TempSrc:   Oral   SpO2: 100%  100% 100%  Weight:      Height:         Discharge Instructions   Allergies as of 05/28/2023       Reactions   Valproic Acid And Related Other (See Comments)   Hyperammonemic encephalopathy   Phenytoin Sodium Extended Other (See Comments), Nausea And Vomiting   Prednisone Hives, Nausea And Vomiting   Latex Hives, Nausea And Vomiting, Rash        Medication List     TAKE these medications    Abilify Maintena 400 MG Prsy prefilled syringe Generic drug: ARIPiprazole ER Inject 400 mg into the muscle every 28 (twenty-eight) days. Due 11/15-11/20   acetaminophen 500 MG tablet Commonly known as: TYLENOL Take 1,000 mg by mouth 3 (three) times daily as needed for mild pain or moderate pain.   ascorbic acid 500 MG tablet Commonly known as: VITAMIN C Take 1 tablet (500 mg total) by mouth daily.   aspirin EC 81 MG tablet Take 1 tablet (81 mg total) by mouth daily. Swallow whole.   benztropine 1 MG tablet Commonly known as: COGENTIN Take 1 tablet (1 mg total) by mouth daily.   cholecalciferol 25 MCG (1000 UNIT) tablet Commonly known as: VITAMIN D3 Take 1,000 Units by mouth daily.   fenofibrate 54 MG tablet Take 54 mg by mouth daily.   folic acid 1 MG tablet Commonly known as: FOLVITE Take 1 tablet (1 mg total) by mouth daily.   furosemide 20 MG tablet Commonly known as: LASIX Take 20 mg by mouth in the morning.   gemfibrozil 600 MG tablet Commonly known as: LOPID Take 600 mg by mouth 2 (two) times daily before a meal.   hydrOXYzine 50 MG tablet Commonly known as: ATARAX Take 50 mg by mouth 3 (three) times daily.    iron polysaccharides 150 MG capsule Commonly known as: NIFEREX Take 1 capsule (150 mg total) by mouth daily.   lamoTRIgine 25 MG tablet Commonly known as: LAMICTAL Take 25 mg by mouth 2 (two) times daily.   levothyroxine 88 MCG tablet Commonly known as: SYNTHROID Take 1 tablet (88 mcg total) by mouth daily at 6 (six) AM.   loperamide 2 MG capsule Commonly known as: IMODIUM Take 4 mg by mouth 2 (two) times daily.   metoprolol tartrate 25 MG tablet Commonly known as: LOPRESSOR Take 1 tablet (25 mg total) by mouth 2 (two) times daily. What changed: additional instructions   OLANZapine 10 MG tablet Commonly known as: ZYPREXA Take 10 mg by mouth at bedtime. (Take with 20mg  tablet to equal 30mg  total) What changed: Another medication with the same name was changed. Make sure you understand how and when to take each.   OLANZapine 20 MG tablet Commonly known as: ZYPREXA Take 1 tablet (20 mg total) by mouth at bedtime. What changed: additional instructions  pantoprazole 40 MG tablet Commonly known as: PROTONIX Take 1 tablet (40 mg total) by mouth daily before breakfast.   polyethylene glycol 17 g packet Commonly known as: MIRALAX / GLYCOLAX Take 17 g by mouth daily.   potassium chloride SA 20 MEQ tablet Commonly known as: KLOR-CON M Take 20 mEq by mouth daily.   rosuvastatin 5 MG tablet Commonly known as: CRESTOR Take 5 mg by mouth daily.   senna 8.6 MG Tabs tablet Commonly known as: SENOKOT Take 2 tablets by mouth at bedtime.   Ventolin HFA 108 (90 Base) MCG/ACT inhaler Generic drug: albuterol Inhale 2 puffs into the lungs every 4 (four) hours as needed.        Allergies  Allergen Reactions   Valproic Acid And Related Other (See Comments)    Hyperammonemic encephalopathy   Phenytoin Sodium Extended Other (See Comments) and Nausea And Vomiting   Prednisone Hives and Nausea And Vomiting   Latex Hives, Nausea And Vomiting and Rash    You were cared for by a  hospitalist during your hospital stay. If you have any questions about your discharge medications or the care you received while you were in the hospital after you are discharged, you can call the unit and asked to speak with the hospitalist on call if the hospitalist that took care of you is not available. Once you are discharged, your primary care physician will handle any further medical issues. Please note that no refills for any discharge medications will be authorized once you are discharged, as it is imperative that you return to your primary care physician (or establish a relationship with a primary care physician if you do not have one) for your aftercare needs so that they can reassess your need for medications and monitor your lab values.  You were cared for by a hospitalist during your hospital stay. If you have any questions about your discharge medications or the care you received while you were in the hospital after you are discharged, you can call the unit and asked to speak with the hospitalist on call if the hospitalist that took care of you is not available. Once you are discharged, your primary care physician will handle any further medical issues. Please note that NO REFILLS for any discharge medications will be authorized once you are discharged, as it is imperative that you return to your primary care physician (or establish a relationship with a primary care physician if you do not have one) for your aftercare needs so that they can reassess your need for medications and monitor your lab values.  Please request your Prim.MD to go over all Hospital Tests and Procedure/Radiological results at the follow up, please get all Hospital records sent to your Prim MD by signing hospital release before you go home.  Get CBC, CMP, 2 view Chest X ray checked  by Primary MD during your next visit or SNF MD in 5-7 days ( we routinely change or add medications that can affect your baseline labs and  fluid status, therefore we recommend that you get the mentioned basic workup next visit with your PCP, your PCP may decide not to get them or add new tests based on their clinical decision)  On your next visit with your primary care physician please Get Medicines reviewed and adjusted.  If you experience worsening of your admission symptoms, develop shortness of breath, life threatening emergency, suicidal or homicidal thoughts you must seek medical attention immediately by calling 911 or calling  your MD immediately  if symptoms less severe.  You Must read complete instructions/literature along with all the possible adverse reactions/side effects for all the Medicines you take and that have been prescribed to you. Take any new Medicines after you have completely understood and accpet all the possible adverse reactions/side effects.   Do not drive, operate heavy machinery, perform activities at heights, swimming or participation in water activities or provide baby sitting services if your were admitted for syncope or siezures until you have seen by Primary MD or a Neurologist and advised to do so again.  Do not drive when taking Pain medications.   Procedures/Studies: EEG adult  Result Date: 2023-06-06 Charlsie Quest, MD     06-Jun-2023 11:46 AM Patient Name: Derrick Atkins MRN: [id number] Epilepsy Attending: Charlsie Quest Referring Physician/Provider: Lovenia Kim, DO Date: 06-Jun-2023 Duration: 33.31 mins Patient history: 52yo M with ams getting eeg to evaluate for seizure Level of alertness: Awake, asleep AEDs during EEG study: None Technical aspects: This EEG study was done with scalp electrodes positioned according to the 10-20 International system of electrode placement. Electrical activity was reviewed with band pass filter of 1-70Hz , sensitivity of 7 uV/mm, display speed of 65mm/sec with a 60Hz  notched filter applied as appropriate. EEG data were recorded continuously and digitally stored.   Video monitoring was available and reviewed as appropriate. Description: The posterior dominant rhythm consists of 7.5 Hz activity of moderate voltage (25-35 uV) seen predominantly in posterior head regions, symmetric and reactive to eye opening and eye closing. Sleep was characterized by vertex waves, sleep spindles (12 to 14 Hz), maximal frontocentral region. EEG showed continuous generalized 5 to 7 Hz theta slowing. Hyperventilation and photic stimulation were not performed.   ABNORMALITY - Continuous slow, generalized IMPRESSION: This study is suggestive of mild to moderate diffuse encephalopathy. No seizures or epileptiform discharges were seen throughout the recording. Charlsie Quest   MR BRAIN WO CONTRAST  Result Date: 05/26/2023 CLINICAL DATA:  Neuro deficit with acute stroke suspected. Mental status change. EXAM: MRI HEAD WITHOUT CONTRAST TECHNIQUE: Multiplanar, multiecho pulse sequences of the brain and surrounding structures were obtained without intravenous contrast. COMPARISON:  Head CT from earlier today FINDINGS: Brain: No acute infarction, hemorrhage, hydrocephalus, extra-axial collection or mass lesion. Periventricular insult with gliosis and volume loss at the body of the left lateral ventricle. Generalized cerebral volume loss greater than expected for age. Vascular: Normal flow voids. Skull and upper cervical spine: Normal marrow signal. Sinuses/Orbits: Negative. IMPRESSION: 1. No acute finding. 2. Atrophy and remote left periventricular insult. Electronically Signed   By: Tiburcio Pea M.D.   On: 05/26/2023 06:25   DG Chest Port 1 View  Result Date: 05/25/2023 CLINICAL DATA:  Intractable nausea and vomiting. EXAM: PORTABLE CHEST 1 VIEW COMPARISON:  04/12/2023 and CT chest 09/23/2005. FINDINGS: Trachea is midline. Heart size normal. Lungs are somewhat low in volume with minimal left lower lobe subsegmental atelectasis. No dense airspace consolidation or pleural fluid. IMPRESSION:  Low lung volumes with subsegmental left lower lobe atelectasis. Electronically Signed   By: Leanna Battles M.D.   On: 05/25/2023 16:21   CT ABDOMEN PELVIS W CONTRAST  Result Date: 05/25/2023 CLINICAL DATA:  Abdominal pain and nausea for several months. EXAM: CT ABDOMEN AND PELVIS WITH CONTRAST TECHNIQUE: Multidetector CT imaging of the abdomen and pelvis was performed using the standard protocol following bolus administration of intravenous contrast. RADIATION DOSE REDUCTION: This exam was performed according to the departmental dose-optimization program which  includes automated exposure control, adjustment of the mA and/or kV according to patient size and/or use of iterative reconstruction technique. CONTRAST:  OMNIPAQUE IOHEXOL 300 MG/ML  SOLN COMPARISON:  04/12/2023 FINDINGS: Lower Chest: No acute findings. Hepatobiliary: No suspicious hepatic masses identified. Gallbladder is unremarkable. No evidence of biliary ductal dilatation. Pancreas:  No mass or inflammatory changes. Spleen: Within normal limits in size and appearance. Adrenals/Urinary Tract: Possible small hyperenhancing mass measuring 10 mm is seen in the upper pole of the left kidney. No evidence of ureteral calculi or hydronephrosis. Stomach/Bowel: Congenital bowel malrotation again noted. Surgical clips again noted within the mesentery. No evidence of obstruction, inflammatory process or abnormal fluid collections. Vascular/Lymphatic: No pathologically enlarged lymph nodes. No acute vascular findings. Reproductive:  No mass or other significant abnormality. Other:  None. Musculoskeletal:  No suspicious bone lesions identified. IMPRESSION: No acute findings. Possible small hyperenhancing mass in upper pole of left kidney. Recommend nonemergent outpatient abdomen MRI without and with contrast for further evaluation. Electronically Signed   By: Danae Orleans M.D.   On: 05/25/2023 12:17   CT Head Wo Contrast  Result Date:  05/25/2023 CLINICAL DATA:  Mental status change, unknown cause. EXAM: CT HEAD WITHOUT CONTRAST TECHNIQUE: Contiguous axial images were obtained from the base of the skull through the vertex without intravenous contrast. RADIATION DOSE REDUCTION: This exam was performed according to the departmental dose-optimization program which includes automated exposure control, adjustment of the mA and/or kV according to patient size and/or use of iterative reconstruction technique. COMPARISON:  Head CT 04/12/2023 FINDINGS: Brain: There is no evidence of an acute infarct, intracranial hemorrhage, mass, midline shift, or extra-axial fluid collection. There is unchanged ex vacuo dilatation of the body of the left lateral ventricle secondary to a remote deep white matter insult. Vascular: No hyperdense vessel. Skull: No acute fracture or suspicious osseous lesion. Sinuses/Orbits: Visualized paranasal sinuses and mastoid air cells are clear. Right cataract extraction. Other: None. IMPRESSION: No evidence of acute intracranial abnormality. Electronically Signed   By: Sebastian Ache M.D.   On: 05/25/2023 10:31     The results of significant diagnostics from this hospitalization (including imaging, microbiology, ancillary and laboratory) are listed below for reference.     Microbiology: Recent Results (from the past 240 hour(s))  Resp panel by RT-PCR (RSV, Flu A&B, Covid) Anterior Nasal Swab     Status: None   Collection Time: 05/25/23  4:04 PM   Specimen: Anterior Nasal Swab  Result Value Ref Range Status   SARS Coronavirus 2 by RT PCR NEGATIVE NEGATIVE Final    Comment: (NOTE) SARS-CoV-2 target nucleic acids are NOT DETECTED.  The SARS-CoV-2 RNA is generally detectable in upper respiratory specimens during the acute phase of infection. The lowest concentration of SARS-CoV-2 viral copies this assay can detect is 138 copies/mL. A negative result does not preclude SARS-Cov-2 infection and should not be used as the  sole basis for treatment or other patient management decisions. A negative result may occur with  improper specimen collection/handling, submission of specimen other than nasopharyngeal swab, presence of viral mutation(s) within the areas targeted by this assay, and inadequate number of viral copies(<138 copies/mL). A negative result must be combined with clinical observations, patient history, and epidemiological information. The expected result is Negative.  Fact Sheet for Patients:  BloggerCourse.com  Fact Sheet for Healthcare Providers:  SeriousBroker.it  This test is no t yet approved or cleared by the [place] FDA and  has been authorized for detection and/or diagnosis  of SARS-CoV-2 by FDA under an Emergency Use Authorization (EUA). This EUA will remain  in effect (meaning this test can be used) for the duration of the COVID-19 declaration under Section 564(b)(1) of the Act, 21 U.S.C.section 360bbb-3(b)(1), unless the authorization is terminated  or revoked sooner.       Influenza A by PCR NEGATIVE NEGATIVE Final   Influenza B by PCR NEGATIVE NEGATIVE Final    Comment: (NOTE) The Xpert Xpress SARS-CoV-2/FLU/RSV plus assay is intended as an aid in the diagnosis of influenza from Nasopharyngeal swab specimens and should not be used as a sole basis for treatment. Nasal washings and aspirates are unacceptable for Xpert Xpress SARS-CoV-2/FLU/RSV testing.  Fact Sheet for Patients: BloggerCourse.com  Fact Sheet for Healthcare Providers: SeriousBroker.it  This test is not yet approved or cleared by the [place] FDA and has been authorized for detection and/or diagnosis of SARS-CoV-2 by FDA under an Emergency Use Authorization (EUA). This EUA will remain in effect (meaning this test can be used) for the duration of the COVID-19 declaration under Section 564(b)(1) of the  Act, 21 U.S.C. section 360bbb-3(b)(1), unless the authorization is terminated or revoked.     Resp Syncytial Virus by PCR NEGATIVE NEGATIVE Final    Comment: (NOTE) Fact Sheet for Patients: BloggerCourse.com  Fact Sheet for Healthcare Providers: SeriousBroker.it  This test is not yet approved or cleared by the [place] FDA and has been authorized for detection and/or diagnosis of SARS-CoV-2 by FDA under an Emergency Use Authorization (EUA). This EUA will remain in effect (meaning this test can be used) for the duration of the COVID-19 declaration under Section 564(b)(1) of the Act, 21 U.S.C. section 360bbb-3(b)(1), unless the authorization is terminated or revoked.  Performed at [facility], [address] [place], [place] [address]   Respiratory (~20 pathogens) panel by PCR     Status: None   Collection Time: 05/25/23  4:04 PM   Specimen: Anterior Nasal Swab; Respiratory  Result Value Ref Range Status   Adenovirus NOT DETECTED NOT DETECTED Final   Coronavirus 229E NOT DETECTED NOT DETECTED Final    Comment: (NOTE) The Coronavirus on the Respiratory Panel, DOES NOT test for the novel  Coronavirus (2019 nCoV)    Coronavirus HKU1 NOT DETECTED NOT DETECTED Final   Coronavirus NL63 NOT DETECTED NOT DETECTED Final   Coronavirus OC43 NOT DETECTED NOT DETECTED Final   Metapneumovirus NOT DETECTED NOT DETECTED Final   Rhinovirus / Enterovirus NOT DETECTED NOT DETECTED Final   Influenza A NOT DETECTED NOT DETECTED Final   Influenza B NOT DETECTED NOT DETECTED Final   Parainfluenza Virus 1 NOT DETECTED NOT DETECTED Final   Parainfluenza Virus 2 NOT DETECTED NOT DETECTED Final   Parainfluenza Virus 3 NOT DETECTED NOT DETECTED Final   Parainfluenza Virus 4 NOT DETECTED NOT DETECTED Final   Respiratory Syncytial Virus NOT DETECTED NOT DETECTED Final   Bordetella pertussis NOT DETECTED NOT DETECTED Final   Bordetella  Parapertussis NOT DETECTED NOT DETECTED Final   Chlamydophila pneumoniae NOT DETECTED NOT DETECTED Final   Mycoplasma pneumoniae NOT DETECTED NOT DETECTED Final    Comment: Performed at [facility], [address], [place], [place] [address]  MRSA Next Gen by PCR, Nasal     Status: None   Collection Time: 05/25/23  4:04 PM   Specimen: Nasal Mucosa; Nasal Swab  Result Value Ref Range Status   MRSA by PCR Next Gen NOT DETECTED NOT DETECTED Final    Comment: (  NOTE) The GeneXpert MRSA Assay (FDA approved for NASAL specimens only), is one component of a comprehensive MRSA colonization surveillance program. It is not intended to diagnose MRSA infection nor to guide or monitor treatment for MRSA infections. Test performance is not FDA approved in patients less than 98 years old. Performed at [facility], [address] [place], [place] [address]   Culture, blood (Routine X 2) w Reflex to ID Panel     Status: None (Preliminary result)   Collection Time: 05/25/23  4:31 PM   Specimen: BLOOD  Result Value Ref Range Status   Specimen Description BLOOD BLOOD RIGHT HAND  Final   Special Requests   Final    BOTTLES DRAWN AEROBIC AND ANAEROBIC Blood Culture results may not be optimal due to an inadequate volume of blood received in culture bottles   Culture   Final    NO GROWTH 3 DAYS Performed at [facility], [address] [place], [place] [address]    Report Status PENDING  Incomplete  Culture, blood (Routine X 2) w Reflex to ID Panel     Status: None (Preliminary result)   Collection Time: 05/25/23  6:10 PM   Specimen: BLOOD  Result Value Ref Range Status   Specimen Description BLOOD BLOOD RIGHT ARM  Final   Special Requests   Final    BOTTLES DRAWN AEROBIC AND ANAEROBIC Blood Culture adequate volume   Culture   Final    NO GROWTH 3 DAYS Performed at [facility], [address] [place], [place] [address]    Report Status PENDING  Incomplete      Labs: BNP (last 3 results) Recent Labs    12/13/22 1358 01/06/23 1742  BNP 212.2* 69.2   Basic Metabolic Panel: Recent Labs  Lab 05/24/23 0654 05/25/23 0846 05/26/23 0605 05/27/23 0625 05/28/23 0353  NA 142 142 143 141 141  K 3.6 3.8 3.8 3.6 3.6  CL 108 107 112* 111 110  CO2 22 24 23 22 23   GLUCOSE 71 94 89 110* 125*  BUN 36* 32* 24* 13 9  CREATININE 1.83* 1.60* 1.18 0.82 0.91  CALCIUM 9.2 9.2 8.7* 8.7* 8.6*  MG  --  2.4 2.0 1.8 1.9  PHOS  --   --  2.9 2.1* 1.7*   Liver Function Tests: Recent Labs  Lab 05/24/23 0654 05/25/23 0846 05/27/23 0625  AST 17 14* 13*  ALT 10 9 7   ALKPHOS 53 51 53  BILITOT 0.9 0.8 0.7  PROT 6.9 7.0 5.6*  ALBUMIN 4.2 4.1 3.2*   Recent Labs  Lab 05/24/23 0654 05/25/23 0846 05/27/23 0625  LIPASE 26 64* 50   Recent Labs  Lab 05/25/23 0941 05/26/23 0605 05/27/23 0845  AMMONIA 52* 118* 56*   CBC: Recent Labs  Lab 05/24/23 0654 05/25/23 0846 05/25/23 1631 05/26/23 0605 05/27/23 0625 05/28/23 0353  WBC 5.0 3.6*  --  3.0* 3.4* 4.0  NEUTROABS  --  2.2  --   --  1.5*  --   HGB 9.7* 9.7* 9.7* 8.5* 8.6* 8.5*  HCT 29.9* 30.0* 28.8* 25.0* 25.4* 24.6*  MCV 87.4 88.2  --  84.7 84.1 83.7  PLT 135* 126*  --  95* 91* 107*   Cardiac Enzymes: No results for input(s): "CKTOTAL", "CKMB", "CKMBINDEX", "TROPONINI" in the last 168 hours. BNP: Invalid input(s): "POCBNP" CBG: Recent Labs  Lab 05/25/23 0849 05/25/23 1606 05/26/23 0038  GLUCAP 91 120* 78   D-Dimer No results for input(s): "DDIMER" in  the last 72 hours. Hgb A1c No results for input(s): "HGBA1C" in the last 72 hours. Lipid Profile No results for input(s): "CHOL", "HDL", "LDLCALC", "TRIG", "CHOLHDL", "LDLDIRECT" in the last 72 hours. Thyroid function studies Recent Labs    05/27/23 0845  TSH 2.941   Anemia work up No results for input(s): "VITAMINB12", "FOLATE", "FERRITIN", "TIBC", "IRON", "RETICCTPCT" in the last 72 hours. Urinalysis    Component Value  Date/Time   COLORURINE YELLOW (A) 05/25/2023 1026   APPEARANCEUR CLEAR (A) 05/25/2023 1026   APPEARANCEUR Clear 12/14/2012 0619   LABSPEC 1.016 05/25/2023 1026   LABSPEC 1.030 12/14/2012 0619   PHURINE 5.0 05/25/2023 1026   GLUCOSEU NEGATIVE 05/25/2023 1026   GLUCOSEU 50 mg/dL 81/19/1478 2956   HGBUR NEGATIVE 05/25/2023 1026   BILIRUBINUR NEGATIVE 05/25/2023 1026   BILIRUBINUR Negative 12/14/2012 0619   KETONESUR NEGATIVE 05/25/2023 1026   PROTEINUR NEGATIVE 05/25/2023 1026   NITRITE NEGATIVE 05/25/2023 1026   LEUKOCYTESUR NEGATIVE 05/25/2023 1026   LEUKOCYTESUR Negative 12/14/2012 0619   Sepsis Labs Recent Labs  Lab 05/25/23 0846 05/26/23 0605 05/27/23 0625 05/28/23 0353  WBC 3.6* 3.0* 3.4* 4.0   Microbiology Recent Results (from the past 240 hour(s))  Resp panel by RT-PCR (RSV, Flu A&B, Covid) Anterior Nasal Swab     Status: None   Collection Time: 05/25/23  4:04 PM   Specimen: Anterior Nasal Swab  Result Value Ref Range Status   SARS Coronavirus 2 by RT PCR NEGATIVE NEGATIVE Final    Comment: (NOTE) SARS-CoV-2 target nucleic acids are NOT DETECTED.  The SARS-CoV-2 RNA is generally detectable in upper respiratory specimens during the acute phase of infection. The lowest concentration of SARS-CoV-2 viral copies this assay can detect is 138 copies/mL. A negative result does not preclude SARS-Cov-2 infection and should not be used as the sole basis for treatment or other patient management decisions. A negative result may occur with  improper specimen collection/handling, submission of specimen other than nasopharyngeal swab, presence of viral mutation(s) within the areas targeted by this assay, and inadequate number of viral copies(<138 copies/mL). A negative result must be combined with clinical observations, patient history, and epidemiological information. The expected result is Negative.  Fact Sheet for Patients:   BloggerCourse.com  Fact Sheet for Healthcare Providers:  SeriousBroker.it  This test is no t yet approved or cleared by the [place] FDA and  has been authorized for detection and/or diagnosis of SARS-CoV-2 by FDA under an Emergency Use Authorization (EUA). This EUA will remain  in effect (meaning this test can be used) for the duration of the COVID-19 declaration under Section 564(b)(1) of the Act, 21 U.S.C.section 360bbb-3(b)(1), unless the authorization is terminated  or revoked sooner.       Influenza A by PCR NEGATIVE NEGATIVE Final   Influenza B by PCR NEGATIVE NEGATIVE Final    Comment: (NOTE) The Xpert Xpress SARS-CoV-2/FLU/RSV plus assay is intended as an aid in the diagnosis of influenza from Nasopharyngeal swab specimens and should not be used as a sole basis for treatment. Nasal washings and aspirates are unacceptable for Xpert Xpress SARS-CoV-2/FLU/RSV testing.  Fact Sheet for Patients: BloggerCourse.com  Fact Sheet for Healthcare Providers: SeriousBroker.it  This test is not yet approved or cleared by the [place] FDA and has been authorized for detection and/or diagnosis of SARS-CoV-2 by FDA under an Emergency Use Authorization (EUA). This EUA will remain in effect (meaning this test can be used) for the duration of the COVID-19 declaration under Section 564(b)(1) of  the Act, 21 U.S.C. section 360bbb-3(b)(1), unless the authorization is terminated or revoked.     Resp Syncytial Virus by PCR NEGATIVE NEGATIVE Final    Comment: (NOTE) Fact Sheet for Patients: BloggerCourse.com  Fact Sheet for Healthcare Providers: SeriousBroker.it  This test is not yet approved or cleared by the [place] FDA and has been authorized for detection and/or diagnosis of SARS-CoV-2 by FDA under an Emergency Use  Authorization (EUA). This EUA will remain in effect (meaning this test can be used) for the duration of the COVID-19 declaration under Section 564(b)(1) of the Act, 21 U.S.C. section 360bbb-3(b)(1), unless the authorization is terminated or revoked.  Performed at [facility], [address] [place], [place] [address]   Respiratory (~20 pathogens) panel by PCR     Status: None   Collection Time: 05/25/23  4:04 PM   Specimen: Anterior Nasal Swab; Respiratory  Result Value Ref Range Status   Adenovirus NOT DETECTED NOT DETECTED Final   Coronavirus 229E NOT DETECTED NOT DETECTED Final    Comment: (NOTE) The Coronavirus on the Respiratory Panel, DOES NOT test for the novel  Coronavirus (2019 nCoV)    Coronavirus HKU1 NOT DETECTED NOT DETECTED Final   Coronavirus NL63 NOT DETECTED NOT DETECTED Final   Coronavirus OC43 NOT DETECTED NOT DETECTED Final   Metapneumovirus NOT DETECTED NOT DETECTED Final   Rhinovirus / Enterovirus NOT DETECTED NOT DETECTED Final   Influenza A NOT DETECTED NOT DETECTED Final   Influenza B NOT DETECTED NOT DETECTED Final   Parainfluenza Virus 1 NOT DETECTED NOT DETECTED Final   Parainfluenza Virus 2 NOT DETECTED NOT DETECTED Final   Parainfluenza Virus 3 NOT DETECTED NOT DETECTED Final   Parainfluenza Virus 4 NOT DETECTED NOT DETECTED Final   Respiratory Syncytial Virus NOT DETECTED NOT DETECTED Final   Bordetella pertussis NOT DETECTED NOT DETECTED Final   Bordetella Parapertussis NOT DETECTED NOT DETECTED Final   Chlamydophila pneumoniae NOT DETECTED NOT DETECTED Final   Mycoplasma pneumoniae NOT DETECTED NOT DETECTED Final    Comment: Performed at [facility], [address] [place], [place] [address]  MRSA Next Gen by PCR, Nasal     Status: None   Collection Time: 05/25/23  4:04 PM   Specimen: Nasal Mucosa; Nasal Swab  Result Value Ref Range Status   MRSA by PCR Next Gen NOT DETECTED NOT DETECTED Final    Comment: (NOTE) The  GeneXpert MRSA Assay (FDA approved for NASAL specimens only), is one component of a comprehensive MRSA colonization surveillance program. It is not intended to diagnose MRSA infection nor to guide or monitor treatment for MRSA infections. Test performance is not FDA approved in patients less than 18 years old. Performed at [facility], [address] [place], [place] [address]   Culture, blood (Routine X 2) w Reflex to ID Panel     Status: None (Preliminary result)   Collection Time: 05/25/23  4:31 PM   Specimen: BLOOD  Result Value Ref Range Status   Specimen Description BLOOD BLOOD RIGHT HAND  Final   Special Requests   Final    BOTTLES DRAWN AEROBIC AND ANAEROBIC Blood Culture results may not be optimal due to an inadequate volume of blood received in culture bottles   Culture   Final    NO GROWTH 3 DAYS Performed at [facility], [address], [place], [place] [address]    Report Status PENDING  Incomplete  Culture, blood (Routine X 2) w Reflex  to ID Panel     Status: None (Preliminary result)   Collection Time: 05/25/23  6:10 PM   Specimen: BLOOD  Result Value Ref Range Status   Specimen Description BLOOD BLOOD RIGHT ARM  Final   Special Requests   Final    BOTTLES DRAWN AEROBIC AND ANAEROBIC Blood Culture adequate volume   Culture   Final    NO GROWTH 3 DAYS Performed at [facility], [address] [place], [place] [address]    Report Status PENDING  Incomplete     Time coordinating discharge:  I have spent 35 minutes face to face with the patient and on the ward discussing the patients care, assessment, plan and disposition with other care givers. >50% of the time was devoted counseling the patient about the risks and benefits of treatment/Discharge disposition and coordinating care.   SIGNED:   Miguel Rota, MD  [facility] 05/28/2023, 12:25 PM   If 7PM-7AM, please contact night-coverage

## 2023-05-28 NOTE — ED Provider Notes (Signed)
[facility] Provider Note    Event Date/Time   First MD Initiated Contact with Patient 05/28/23 2205     (approximate)   History   Psychiatric Evaluation   HPI  Derrick Atkins is a 52 y.o. male reports he is here due to hallucinations.  He reports that he is have these visions like "and times" that will come and go.  They have been present since 2022 but seems like they are getting worse.  He went to his group home today and he reports that he also vomited once, but now he is hungry would like something to eat and is not nauseated any longer.  He is in no pain or discomfort.  He left the hospital today.  He reports he is "just a man with cerebral palsy".  He feels like his thoughts are not very clear, he feels like he is having increased degree of hallucinations the last few days, given having these while in the hospital   Reviewed discharge summaries of the day the patient underwent a fairly extensive workup for encephalopathy including EEG MRI.  He was noted to have high per ammonemia.  He was admitted initially for intractable nausea and vomiting  Physical Exam   Triage Vital Signs: ED Triage Vitals  Encounter Vitals Group     BP 05/28/23 2203 120/67     Systolic BP Percentile --      Diastolic BP Percentile --      Pulse Rate 05/28/23 2203 74     Resp 05/28/23 2203 16     Temp 05/28/23 2203 98 F (36.7 C)     Temp Source 05/28/23 2203 Oral     SpO2 05/28/23 2203 100 %     Weight --      Height 05/28/23 2157 6\' 3"  (1.905 m)     Head Circumference --      Peak Flow --      Pain Score 05/28/23 2157 0     Pain Loc --      Pain Education --      Exclude from Growth Chart --     Most recent vital signs: Vitals:   05/28/23 2203 05/29/23 0727  BP: 120/67 120/69  Pulse: 74 (!) 105  Resp: 16 16  Temp: 98 F (36.7 C) 97.7 F (36.5 C)  SpO2: 100% 98%     General: Awake, no distress.  Able to sit up, sit at the side the bed lays down  without difficulty.  Very flat affect does not appear to be responding to external stimuli.  He is pleasant.  Mucous membranes slightly dry CV:  Good peripheral perfusion.  Normal tones and rate Resp:  Normal effort.  Clear bilateral Abd:  No distention.  Soft nontender nondistended Other:     ED Results / Procedures / Treatments   Labs (all labs ordered are listed, but only abnormal results are displayed) Labs Reviewed  COMPREHENSIVE METABOLIC PANEL - Abnormal; Notable for the following components:      Result Value   Glucose, Bld 128 (*)    Calcium 8.7 (*)    Total Protein 6.3 (*)    All other components within normal limits  SALICYLATE LEVEL - Abnormal; Notable for the following components:   Salicylate Lvl <7.0 (*)    All other components within normal limits  ACETAMINOPHEN LEVEL - Abnormal; Notable for the following components:   Acetaminophen (Tylenol), Serum <10 (*)    All other components  within normal limits  CBC - Abnormal; Notable for the following components:   RBC 3.33 (*)    Hemoglobin 9.6 (*)    HCT 28.5 (*)    Platelets 135 (*)    All other components within normal limits  ETHANOL  AMMONIA   All lab results are reassuring.  Normal ammonia level.  Mild chronic anemia.  Negative acetaminophen and salicylate levels.   PROCEDURES:  Critical Care performed: No  Procedures   MEDICATIONS ORDERED IN ED: Medications - No data to display   IMPRESSION / MDM / ASSESSMENT AND PLAN / ED COURSE  I reviewed the triage vital signs and the nursing notes.                              Differential diagnosis includes, but is not limited to, possible recurrent hyperammonemia, chronic longstanding hallucinations or underlying cognitive condition, psychosis, medication effect, metabolic abnormality etc.  Given the patient's discharge from the hospital today with ongoing as he describes symptoms but no active emesis and currently hungry reassuring abdominal exam with no  tenderness pain or discomfort; I suspect that he does not likely have an acute change in medical condition but wish to review his labs from this evening as well as his ammonia level  He is awake alert requesting to eat, no active vomiting in the ER.  He does also report longstanding history of hallucinations and would like to speak to a psychiatrist.  I placed a psychiatry consult.  Does not appear to be a high risk for suicide, he reports that these intrusive hallucinations have been present for a couple of years.  Does not have a plan to harm himself.  Patient's presentation is most consistent with acute complicated illness / injury requiring diagnostic workup.   Patient was seen and evaluated by psychiatry, advised patient may be discharged back to his group home.  TTS staff to assist in contacting group home, assist with returning to his group home.  Ongoing care assigned to oncoming physician to follow-up on final plan for disposition and safe return to his group home facility       FINAL CLINICAL IMPRESSION(S) / ED DIAGNOSES   Final diagnoses:  Hallucinations     Rx / DC Orders   ED Discharge Orders     None        Note:  This document was prepared using Dragon voice recognition software and may include unintentional dictation errors.   Sharyn Creamer, MD 06/15/23 [phone]

## 2023-05-28 NOTE — BH Assessment (Signed)
Writer spoke with the patient to complete an assessment. Patient denies SI/HI. Patient does not meet inpatient criteria and can return to group home.   Writer attempted to [employer] of [employer] [facility] Izetta Dakin [phone]); however, there was no answer.A HIPAA Compliant voicemail was left requesting a callback.  Writer also attempted to reach [employer] [phone] (Legal Guardian); A HIPAA Compliant voicemail was left requesting a callback.

## 2023-05-28 NOTE — ED Notes (Addendum)
Pt dressed out :  Black shoes Blue socks Blue jeans Grey tshirt [employer]

## 2023-05-28 NOTE — Consult Note (Signed)
Telepsych Consultation   Reason for Consult:  Psych evaluation Referring Physician:  Dr. Fanny Bien Location of Patient: [facility] Location of Provider: Behavioral Health TTS Department  Patient Identification: Derrick Atkins MRN:  [id number] Principal Diagnosis: <principal problem not specified> Diagnosis:  Active Problems:   * No active hospital problems. *   Total Time spent with patient: 30 minutes  Subjective:  "Says he's here because he has been throwing up and having hallucination"   HPI:  Tele psych Assessment   Derrick Atkins, 52 y.o., male patient with history of dyslipidemia, chronic paranoid schizophrenia, chronic generalized weakness, pancytopenia, chronic thrombocytopenia, hypertension, seen via tele health by TTS and this provider; chart reviewed and consulted with Dr. Fanny Bien on 05/28/23.  Per chart review, triage note states Pt to ed from group home on [address] for :"well check". Per EMS pt states he is having hallucinations and seeing and hearing things. On evaluation Derrick Atkins reports that he has been having stomach issues and has been vomiting for a few days.  When asked about his hallucinations, he says that they are "hard to explain". Per chart review, patient was dc'd from the hospital today for abdominal pains and vomiting. When asked how he's doing at the group home, he reports, "I hate it there, Derrick Atkins is a bitch".  He states that Derrick Atkins doesn't believe his ailments and that he was born with cerebral palsey.  After therapeutic communication with the psych team, patient is agreeable to going back to the group home.  He says he will talk with his actt team and that the group home staff has set up a meeting for Monday.  During evaluation Derrick Atkins is sitting on his bed fully engaged in the assessment and answering questions appropriately; he is alert/oriented x 4; calm/cooperative; and mood congruent with affect.  Patient is speaking in a clear tone at  moderate volume, and normal pace; with good eye contact.  His thought process is coherent and relevant; There is no indication that he is currently responding to internal/external stimuli or experiencing delusional thought content.  Patient denies suicidal/self-harm/homicidal ideation, psychosis, and paranoia.  Patient has remained calm throughout assessment and has answered questions appropriately.      Recommendations: Patient is psych cleared and is able to be discharged back to his group home    Dr. Fanny Bien informed of above recommendation and disposition  Past Psychiatric History: Schizophrenia  Risk to Self:   Risk to Others:   Prior Inpatient Therapy:   Prior Outpatient Therapy:    Past Medical History:  Past Medical History:  Diagnosis Date   Cerebral palsy (HCC)    Depression    Diabetes mellitus without complication (HCC)    Hypertension    Kidney stones    Schizoaffective disorder (HCC)    Scoliosis     Past Surgical History:  Procedure Laterality Date   BOWEL RESECTION     CATARACT EXTRACTION W/PHACO Right 09/15/2022   Procedure: CATARACT EXTRACTION PHACO AND INTRAOCULAR LENS PLACEMENT (IOC) RIGHT DIABETIC VISION BLUE HEALON 5  15.79  01:31.9;  Surgeon: Lockie Mola, MD;  Location: [facility];  Service: Ophthalmology;  Laterality: Right;  Latex Diabetic   INCISION AND DRAINAGE ABSCESS Left 12/20/2022   Procedure: INCISION AND DRAINAGE ABSCESS;  Surgeon: Campbell Lerner, MD;  Location: [facility];  Service: General;  Laterality: Left;   LAPAROTOMY N/A 05/15/2020   Procedure: EXPLORATORY LAPAROTOMY;  Surgeon: Henrene Dodge, MD;  Location: [facility];  Service:  General;  Laterality: N/A;   Family History:  Family History  Problem Relation Age of Onset   Heart disease Father    Heart attack Father    Family Psychiatric  History: unknown Social History:  Social History   Substance and Sexual Activity  Alcohol Use No     Social History   Substance  and Sexual Activity  Drug Use No    Social History   Socioeconomic History   Marital status: Single    Spouse name: Not on file   Number of children: Not on file   Years of education: Not on file   Highest education level: Not on file  Occupational History   Not on file  Tobacco Use   Smoking status: Never    Passive exposure: Never   Smokeless tobacco: Never  Vaping Use   Vaping status: Never Used  Substance and Sexual Activity   Alcohol use: No   Drug use: No   Sexual activity: Not Currently  Other Topics Concern   Not on file  Social History Narrative   Not on file   Social Determinants of Health   Financial Resource Strain: Not on file  Food Insecurity: No Food Insecurity (05/25/2023)   Hunger Vital Sign    Worried About Running Out of Food in the Last Year: Never true    Ran Out of Food in the Last Year: Never true  Transportation Needs: No Transportation Needs (05/25/2023)   PRAPARE - Administrator, Civil Service (Medical): No    Lack of Transportation (Non-Medical): No  Recent Concern: Transportation Needs - Unmet Transportation Needs (04/14/2023)   PRAPARE - Administrator, Civil Service (Medical): Yes    Lack of Transportation (Non-Medical): Yes  Physical Activity: Not on file  Stress: Not on file  Social Connections: Not on file   Additional Social History:    Allergies:   Allergies  Allergen Reactions   Valproic Acid And Related Other (See Comments)    Hyperammonemic encephalopathy   Phenytoin Sodium Extended Other (See Comments) and Nausea And Vomiting   Prednisone Hives and Nausea And Vomiting   Latex Hives, Nausea And Vomiting and Rash    Labs:  Results for orders placed or performed during the hospital encounter of 05/25/23 (from the past 48 hour(s))  Lipase, blood     Status: None   Collection Time: 05/27/23  6:25 AM  Result Value Ref Range   Lipase 50 11 - 51 U/L    Comment: Performed at [facility],  [address], [place], [place] [address]  Basic metabolic panel     Status: Abnormal   Collection Time: 05/27/23  6:25 AM  Result Value Ref Range   Sodium 141 135 - 145 mmol/L   Potassium 3.6 3.5 - 5.1 mmol/L   Chloride 111 98 - 111 mmol/L   CO2 22 22 - 32 mmol/L   Glucose, Bld 110 (H) 70 - 99 mg/dL    Comment: Glucose reference range applies only to samples taken after fasting for at least 8 hours.   BUN 13 6 - 20 mg/dL   Creatinine, Ser 6.04 0.61 - 1.24 mg/dL   Calcium 8.7 (L) 8.9 - 10.3 mg/dL   GFR, Estimated >54 >09 mL/min    Comment: (NOTE) Calculated using the CKD-EPI Creatinine Equation (2021)    Anion gap 8 5 - 15    Comment: Performed at [facility], [address], [place], [place] [address]  Phosphorus     Status: Abnormal   Collection Time: 05/27/23  6:25 AM  Result Value Ref Range   Phosphorus 2.1 (L) 2.5 - 4.6 mg/dL    Comment: Performed at [facility], [address] [place], [place] [address]  Magnesium     Status: None   Collection Time: 05/27/23  6:25 AM  Result Value Ref Range   Magnesium 1.8 1.7 - 2.4 mg/dL    Comment: Performed at [facility], [address] [place], [place] [address]  Hepatic function panel     Status: Abnormal   Collection Time: 05/27/23  6:25 AM  Result Value Ref Range   Total Protein 5.6 (L) 6.5 - 8.1 g/dL   Albumin 3.2 (L) 3.5 - 5.0 g/dL   AST 13 (L) 15 - 41 U/L   ALT 7 0 - 44 U/L   Alkaline Phosphatase 53 38 - 126 U/L   Total Bilirubin 0.7 <1.2 mg/dL   Bilirubin, Direct 0.2 0.0 - 0.2 mg/dL   Indirect Bilirubin 0.5 0.3 - 0.9 mg/dL    Comment: Performed at [facility], [address] [place], [place] [address]  CBC with Differential/Platelet     Status: Abnormal   Collection Time: 05/27/23  6:25 AM  Result Value Ref Range   WBC 3.4 (L) 4.0 - 10.5 K/uL   RBC 3.02 (L) 4.22 - 5.81 MIL/uL   Hemoglobin 8.6 (L) 13.0 - 17.0 g/dL   HCT 91.4 (L) 78.2 - 95.6 %   MCV 84.1 80.0 - 100.0 fL    MCH 28.5 26.0 - 34.0 pg   MCHC 33.9 30.0 - 36.0 g/dL   RDW 21.3 08.6 - 57.8 %   Platelets 91 (L) 150 - 400 K/uL    Comment: Immature Platelet Fraction may be clinically indicated, consider ordering this additional test [id number]    nRBC 0.0 0.0 - 0.2 %   Neutrophils Relative % 46 %   Neutro Abs 1.5 (L) 1.7 - 7.7 K/uL   Lymphocytes Relative 45 %   Lymphs Abs 1.5 0.7 - 4.0 K/uL   Monocytes Relative 7 %   Monocytes Absolute 0.2 0.1 - 1.0 K/uL   Eosinophils Relative 2 %   Eosinophils Absolute 0.1 0.0 - 0.5 K/uL   Basophils Relative 0 %   Basophils Absolute 0.0 0.0 - 0.1 K/uL   Immature Granulocytes 0 %   Abs Immature Granulocytes 0.01 0.00 - 0.07 K/uL    Comment: Performed at [facility], [address] [place], [place] [address]  Lactic acid, plasma     Status: None   Collection Time: 05/27/23  6:25 AM  Result Value Ref Range   Lactic Acid, Venous 0.9 0.5 - 1.9 mmol/L    Comment: Performed at [facility], [address] [place], [place] [address]  Ammonia     Status: Abnormal   Collection Time: 05/27/23  8:45 AM  Result Value Ref Range   Ammonia 56 (H) 9 - 35 umol/L    Comment: Performed at [facility], [address] [place], [place] [address]  TSH     Status: None   Collection Time: 05/27/23  8:45 AM  Result Value Ref Range   TSH 2.941 0.350 - 4.500 uIU/mL    Comment: Performed by a 3rd Generation assay with a functional sensitivity of <=0.01 uIU/mL. Performed at [facility], [address] [place], [place] [address]   Basic metabolic panel     Status: Abnormal  Collection Time: 05/28/23  3:53 AM  Result Value Ref Range   Sodium 141 135 - 145 mmol/L   Potassium 3.6 3.5 - 5.1 mmol/L   Chloride 110 98 - 111 mmol/L   CO2 23 22 - 32 mmol/L   Glucose, Bld 125 (H) 70 - 99 mg/dL    Comment: Glucose reference range applies only to samples taken after fasting for at least 8 hours.   BUN 9 6 - 20 mg/dL   Creatinine, Ser 1.61  0.61 - 1.24 mg/dL   Calcium 8.6 (L) 8.9 - 10.3 mg/dL   GFR, Estimated >09 >60 mL/min    Comment: (NOTE) Calculated using the CKD-EPI Creatinine Equation (2021)    Anion gap 8 5 - 15    Comment: Performed at [facility], [address] [place], [place] [address]  CBC     Status: Abnormal   Collection Time: 05/28/23  3:53 AM  Result Value Ref Range   WBC 4.0 4.0 - 10.5 K/uL   RBC 2.94 (L) 4.22 - 5.81 MIL/uL   Hemoglobin 8.5 (L) 13.0 - 17.0 g/dL   HCT 81.1 (L) 91.4 - 78.2 %   MCV 83.7 80.0 - 100.0 fL   MCH 28.9 26.0 - 34.0 pg   MCHC 34.6 30.0 - 36.0 g/dL   RDW 95.6 21.3 - 08.6 %   Platelets 107 (L) 150 - 400 K/uL   nRBC 0.0 0.0 - 0.2 %    Comment: Performed at [facility], [address], [place], [place] [address]  Magnesium     Status: None   Collection Time: 05/28/23  3:53 AM  Result Value Ref Range   Magnesium 1.9 1.7 - 2.4 mg/dL    Comment: Performed at [facility], [address] [place], [place] [address]  Phosphorus     Status: Abnormal   Collection Time: 05/28/23  3:53 AM  Result Value Ref Range   Phosphorus 1.7 (L) 2.5 - 4.6 mg/dL    Comment: Performed at [facility], [address] [place], [place] [address]    Medications:  No current facility-administered medications for this encounter.   Current Outpatient Medications  Medication Sig Dispense Refill   ABILIFY MAINTENA 400 MG PRSY prefilled syringe Inject 400 mg into the muscle every 28 (twenty-eight) days. Due 11/15-11/20     acetaminophen (TYLENOL) 500 MG tablet Take 1,000 mg by mouth 3 (three) times daily as needed for mild pain or moderate pain.     ascorbic acid (VITAMIN C) 500 MG tablet Take 1 tablet (500 mg total) by mouth daily. 30 tablet 3   aspirin EC 81 MG tablet Take 1 tablet (81 mg total) by mouth daily. Swallow whole. 30 tablet 12   benztropine (COGENTIN) 1 MG tablet Take 1 tablet (1 mg total) by mouth daily. 30 tablet 1   cholecalciferol (VITAMIN D3)  25 MCG (1000 UNIT) tablet Take 1,000 Units by mouth daily.     fenofibrate 54 MG tablet Take 54 mg by mouth daily.     folic acid (FOLVITE) 1 MG tablet Take 1 tablet (1 mg total) by mouth daily. 30 tablet 3   furosemide (LASIX) 20 MG tablet Take 20 mg by mouth in the morning.     gemfibrozil (LOPID) 600 MG tablet Take 600 mg by mouth 2 (two) times daily before a meal.     hydrOXYzine (ATARAX) 50 MG tablet Take 50 mg by mouth 3 (three) times daily.     iron polysaccharides (  NIFEREX) 150 MG capsule Take 1 capsule (150 mg total) by mouth daily. 30 capsule 3   lamoTRIgine (LAMICTAL) 25 MG tablet Take 25 mg by mouth 2 (two) times daily.     levothyroxine (SYNTHROID) 88 MCG tablet Take 1 tablet (88 mcg total) by mouth daily at 6 (six) AM. 30 tablet 1   loperamide (IMODIUM) 2 MG capsule Take 4 mg by mouth 2 (two) times daily.     metoprolol tartrate (LOPRESSOR) 25 MG tablet Take 1 tablet (25 mg total) by mouth 2 (two) times daily. (Patient taking differently: Take 25 mg by mouth 2 (two) times daily. Hold if SBP < 110 or DBP < 60) 60 tablet 2   OLANZapine (ZYPREXA) 10 MG tablet Take 10 mg by mouth at bedtime. (Take with 20mg  tablet to equal 30mg  total)     OLANZapine (ZYPREXA) 20 MG tablet Take 1 tablet (20 mg total) by mouth at bedtime. (Patient taking differently: Take 20 mg by mouth at bedtime. (Take with 10mg  tablet to equal 30mg  total)) 30 tablet 1   pantoprazole (PROTONIX) 40 MG tablet Take 1 tablet (40 mg total) by mouth daily before breakfast. 30 tablet 0   polyethylene glycol (MIRALAX / GLYCOLAX) 17 g packet Take 17 g by mouth daily.     potassium chloride SA (KLOR-CON M) 20 MEQ tablet Take 20 mEq by mouth daily.     rosuvastatin (CRESTOR) 5 MG tablet Take 5 mg by mouth daily.     senna (SENOKOT) 8.6 MG TABS tablet Take 2 tablets by mouth at bedtime.     VENTOLIN HFA 108 (90 Base) MCG/ACT inhaler Inhale 2 puffs into the lungs every 4 (four) hours as needed.      Musculoskeletal: Strength &  Muscle Tone: within normal limits Gait & Station: normal Patient leans: N/A          Psychiatric Specialty Exam:  Presentation  General Appearance:  Appropriate for Environment  Eye Contact: Good  Speech: Clear and Coherent  Speech Volume: Normal  Handedness:No data recorded  Mood and Affect  Mood: Euthymic (at baseline)  Affect: Congruent (at baseline)   Thought Process  Thought Processes: Goal Directed  Descriptions of Associations:Intact  Orientation:Full (Time, Place and Person)  Thought Content:WDL (at baseline)  History of Schizophrenia/Schizoaffective disorder:No data recorded Duration of Psychotic Symptoms:No data recorded Hallucinations:No data recorded Ideas of Reference:None  Suicidal Thoughts:No data recorded Homicidal Thoughts:No data recorded  Sensorium  Memory: Immediate Good  Judgment: Poor  Insight: Poor   Executive Functions  Concentration: Fair  Attention Span: Fair  Recall: Fair  Fund of Knowledge: Fair  Language: Fair   Psychomotor Activity  Psychomotor Activity:No data recorded  Assets  Assets: Communication Skills; Desire for Improvement; Financial Resources/Insurance; Housing; Social Support; Resilience; Physical Health   Sleep  Sleep:No data recorded   Physical Exam: Physical Exam Vitals and nursing note reviewed.    ROS Blood pressure 120/67, pulse 74, temperature 98 F (36.7 C), temperature source Oral, resp. rate 16, height 6\' 3"  (1.905 m), SpO2 100%. Body mass index is 24.25 kg/m.  Treatment Plan Summary: Plan group home will pick patient up in the am  Disposition: No evidence of imminent risk to self or others at present.   Patient does not meet criteria for psychiatric inpatient admission. Discussed crisis plan, support from social network, calling 911, coming to the Emergency Department, and calling Suicide Hotline.  This service was provided via telemedicine using a 2-way,  interactive audio and video technology.  Jearld Lesch, NP 05/28/2023 10:33 PM

## 2023-05-28 NOTE — ED Notes (Signed)
Unable to get blood in triage x2 sticks. Will make RN aware that lab is called.

## 2023-05-28 NOTE — ED Triage Notes (Addendum)
Pt states he is suicidal. But then states "I have vomiting and need a well visit ". Pt is caox4, in no acute distress in triage.   [address]  EMS said staff at facility would come pick him up.

## 2023-05-29 LAB — AMMONIA: Ammonia: 24 umol/L (ref 9–35)

## 2023-05-29 NOTE — ED Notes (Signed)
Pt provided breakfast tray, ate 100% of meal

## 2023-05-29 NOTE — ED Notes (Addendum)
Called [facility] about picking up pt, [facility] stated they would call the owner to pick up pt.

## 2023-05-29 NOTE — Discharge Instructions (Signed)
   Please see your primary doctor this week for a follow up appointment.   If you have any new, worsening, or unexpected symptoms call your doctor right away or come back to the emergency department for reevaluation.

## 2023-05-29 NOTE — ED Notes (Signed)
Pt up to use restroom.

## 2023-05-29 NOTE — ED Notes (Signed)
Derrick Atkins from the group home [facility] called and stated she is one of [name]'s caregivers and there is no one at the group home that can pick the pt up tonight upon discharge. Per Nurse Shawna Orleans I let the caregiver know that the pt would be kept in the ED until morning , but transportation needed to be provided by the group home because EMS will not be provided because the pt can walk.

## 2023-05-29 NOTE — ED Notes (Signed)
Spoke with [facility] staff to come get patient after discharge, stated they could not come get him and to send him by ambulance, this RN explained patient does not qualify for ambulance transport. [facility] staff stated she would call the owner and call us back

## 2023-05-29 NOTE — ED Notes (Signed)
ED secretary Christen Bame called to inform [facility] is on the way to pick up pt. Pt given clothes to change in to.

## 2023-05-30 LAB — CULTURE, BLOOD (ROUTINE X 2)
Culture: NO GROWTH
Culture: NO GROWTH
Special Requests: ADEQUATE

## 2023-08-26 ENCOUNTER — Emergency Department: Payer: Medicare Other

## 2023-08-26 ENCOUNTER — Other Ambulatory Visit: Payer: Self-pay

## 2023-08-26 DIAGNOSIS — S0990XA Unspecified injury of head, initial encounter: Secondary | ICD-10-CM | POA: Diagnosis not present

## 2023-08-26 DIAGNOSIS — F200 Paranoid schizophrenia: Secondary | ICD-10-CM | POA: Diagnosis not present

## 2023-08-26 DIAGNOSIS — W010XXA Fall on same level from slipping, tripping and stumbling without subsequent striking against object, initial encounter: Secondary | ICD-10-CM | POA: Diagnosis not present

## 2023-08-26 DIAGNOSIS — F419 Anxiety disorder, unspecified: Secondary | ICD-10-CM | POA: Diagnosis present

## 2023-08-26 DIAGNOSIS — M7918 Myalgia, other site: Secondary | ICD-10-CM | POA: Diagnosis not present

## 2023-08-26 DIAGNOSIS — S01112A Laceration without foreign body of left eyelid and periocular area, initial encounter: Secondary | ICD-10-CM | POA: Diagnosis present

## 2023-08-26 NOTE — ED Triage Notes (Signed)
[facility] via [facility].  Patient fell walking on the side of the street vs middle of the road.  Fall, hit head.  Unknown if patient takes blood thinners. Lives at a group home.  Not found far from group home. Patient C?O left eyebrow laceration. Abrasions to bilateral knee, and c/o right wrist pain. Also c/o head pain.  VS wnl.

## 2023-08-26 NOTE — ED Triage Notes (Signed)
Refer to first nurse note

## 2023-08-26 NOTE — ED Provider Triage Note (Signed)
Emergency Medicine Provider Triage Evaluation Note  Derrick Atkins , a 53 y.o. male  was evaluated in triage.  Pt complains of concerns of fall.  Larey Seat tripped while walking.  Injured head no LOC also right hand pain..  Review of Systems  Positive:  Negative:   Physical Exam  BP 131/68 (BP Location: Left Arm)   Pulse 92   Temp 97.9 F (36.6 C) (Oral)   Resp 17   Ht 6\' 4"  (1.93 m)   Wt 79.4 kg   BMI 21.30 kg/m  Gen:   Awake, no distress   Resp:  Normal effort  MSK:   Moves extremities without difficulty  Other:    Medical Decision Making  Medically screening exam initiated at 5:54 PM.  Appropriate orders placed.  [name] was informed that the remainder of the evaluation will be completed by another provider, this initial triage assessment does not replace that evaluation, and the importance of remaining in the ED until their evaluation is complete.  Head CT and hand x-ray.   Christen Bame, [place] 08/26/23 1755

## 2023-08-26 NOTE — ED Notes (Addendum)
Pt asking for pain meds. Pt is asking to leave. Pt was offered tylenol. And educated about wait.

## 2023-08-26 NOTE — ED Notes (Signed)
Derrick Atkins (DSS) [phone]-- Patient guardian

## 2023-08-27 ENCOUNTER — Emergency Department
Admission: EM | Admit: 2023-08-27 | Discharge: 2023-08-27 | Disposition: A | Discharge status: Home / Self Care | Payer: Medicare Other | Attending: Emergency Medicine | Admitting: Emergency Medicine

## 2023-08-27 ENCOUNTER — Emergency Department: Payer: Medicare Other

## 2023-08-27 ENCOUNTER — Emergency Department (EMERGENCY_DEPARTMENT_HOSPITAL)
Admission: EM | Admit: 2023-08-27 | Discharge: 2023-08-28 | Disposition: A | Discharge status: Home / Self Care | Payer: Medicare Other | Attending: Emergency Medicine | Admitting: Emergency Medicine

## 2023-08-27 ENCOUNTER — Other Ambulatory Visit: Payer: Self-pay

## 2023-08-27 DIAGNOSIS — M7918 Myalgia, other site: Secondary | ICD-10-CM

## 2023-08-27 DIAGNOSIS — S0081XA Abrasion of other part of head, initial encounter: Secondary | ICD-10-CM

## 2023-08-27 DIAGNOSIS — F419 Anxiety disorder, unspecified: Secondary | ICD-10-CM | POA: Insufficient documentation

## 2023-08-27 DIAGNOSIS — W19XXXA Unspecified fall, initial encounter: Secondary | ICD-10-CM

## 2023-08-27 DIAGNOSIS — E119 Type 2 diabetes mellitus without complications: Secondary | ICD-10-CM

## 2023-08-27 DIAGNOSIS — F200 Paranoid schizophrenia: Secondary | ICD-10-CM | POA: Diagnosis present

## 2023-08-27 DIAGNOSIS — G9340 Encephalopathy, unspecified: Secondary | ICD-10-CM | POA: Diagnosis present

## 2023-08-27 DIAGNOSIS — S01112A Laceration without foreign body of left eyelid and periocular area, initial encounter: Secondary | ICD-10-CM | POA: Diagnosis not present

## 2023-08-27 LAB — CBC
HCT: 29.1 % — ABNORMAL LOW (ref 39.0–52.0)
Hemoglobin: 9.9 g/dL — ABNORMAL LOW (ref 13.0–17.0)
MCH: 28.5 pg (ref 26.0–34.0)
MCHC: 34 g/dL (ref 30.0–36.0)
MCV: 83.9 fL (ref 80.0–100.0)
Platelets: 178 10*3/uL (ref 150–400)
RBC: 3.47 MIL/uL — ABNORMAL LOW (ref 4.22–5.81)
RDW: 15.8 % — ABNORMAL HIGH (ref 11.5–15.5)
WBC: 7.4 10*3/uL (ref 4.0–10.5)
nRBC: 0 % (ref 0.0–0.2)

## 2023-08-27 LAB — COMPREHENSIVE METABOLIC PANEL
ALT: 21 U/L (ref 0–44)
AST: 27 U/L (ref 15–41)
Albumin: 3.9 g/dL (ref 3.5–5.0)
Alkaline Phosphatase: 60 U/L (ref 38–126)
Anion gap: 13 (ref 5–15)
BUN: 25 mg/dL — ABNORMAL HIGH (ref 6–20)
CO2: 18 mmol/L — ABNORMAL LOW (ref 22–32)
Calcium: 9.6 mg/dL (ref 8.9–10.3)
Chloride: 107 mmol/L (ref 98–111)
Creatinine, Ser: 1.1 mg/dL (ref 0.61–1.24)
GFR, Estimated: >60 mL/min (ref >60)
Glucose, Bld: 162 mg/dL — ABNORMAL HIGH (ref 70–99)
Potassium: 3.7 mmol/L (ref 3.5–5.1)
Sodium: 138 mmol/L (ref 135–145)
Total Bilirubin: 1.1 mg/dL (ref 0.0–1.2)
Total Protein: 6.8 g/dL (ref 6.5–8.1)

## 2023-08-27 LAB — ETHANOL: Alcohol, Ethyl (B): <10 mg/dL (ref <10)

## 2023-08-27 LAB — SALICYLATE LEVEL: Salicylate Lvl: <7 mg/dL — ABNORMAL LOW (ref 7.0–30.0)

## 2023-08-27 LAB — ACETAMINOPHEN LEVEL: Acetaminophen (Tylenol), Serum: <10 ug/mL — ABNORMAL LOW (ref 10–30)

## 2023-08-27 MED ORDER — IBUPROFEN 600 MG PO TABS
600.0000 mg | ORAL_TABLET | Freq: Once | ORAL | Status: AC
  Administered 2023-08-27: 600 mg via ORAL
  Filled 2023-08-27: qty 1

## 2023-08-27 NOTE — ED Provider Notes (Signed)
 [facility] Provider Note    Event Date/Time   First MD Initiated Contact with Patient 08/27/23 0011     (approximate)   History   Fall   HPI Derrick Atkins is a 53 y.o. male from a local group home who presents after a fall.  He states that he ran away from the group home because they are being mean to him and not giving him enough to eat and making fun of him.  He was crossing the street and tripped and fell, scraping his hand on the ground, landing on his right knee, and also striking the left side of his forehead.  He said that is where it hurts the most but his right knee also hurts.  No chest pain or shortness of breath.  No suicidal ideation or homicidal ideation and he states he knows he needs to go back to his group home.     Physical Exam   Triage Vital Signs: ED Triage Vitals  Encounter Vitals Group     BP 08/26/23 1751 131/68     Systolic BP Percentile --      Diastolic BP Percentile --      Pulse Rate 08/26/23 1751 92     Resp 08/26/23 1751 17     Temp 08/26/23 1751 97.9 F (36.6 C)     Temp Source 08/26/23 1751 Oral     SpO2 08/26/23 1755 100 %     Weight 08/26/23 1752 79.4 kg (175 lb)     Height 08/26/23 1752 1.93 m (6\' 4" )     Head Circumference --      Peak Flow --      Pain Score 08/26/23 1751 5     Pain Loc --      Pain Education --      Exclude from Growth Chart --     Most recent vital signs: Vitals:   08/26/23 1751 08/26/23 1755  BP: 131/68   Pulse: 92   Resp: 17   Temp: 97.9 F (36.6 C)   SpO2:  100%    General: Awake, anxious but generally at his baseline. CV:  Good peripheral perfusion.  Regular rate and rhythm. Resp:  Normal effort. Speaking easily and comfortably, no accessory muscle usage nor intercostal retractions.   Abd:  No distention.  Other:  Patient has an abrasion along the lateral aspect of his left eyebrow with a small laceration but mostly abrasion consistent with "road rash".  This is a small  area about 2 cm x 2 cm and the actual laceration is less than a centimeter long and is embedded within the abrasion.  He has some missing skin on his knuckles where he also abraded them and he has pain with flexion extension of the right knee but no effusion and visible or palpable deformity to the knee.   ED Results / Procedures / Treatments   Labs (all labs ordered are listed, but only abnormal results are displayed) Labs Reviewed - No data to display   RADIOLOGY I viewed and interpreted the patient's head CT, cervical spine CT, right hand x-rays, and right knee x-rays.  There is no acute injury or fracture/dislocation identified on any of the imaging and no acute intracranial hemorrhage.  Radiologist confirms normal images.   PROCEDURES:  Critical Care performed: No  .Laceration Repair  Date/Time: 08/27/2023 1:37 AM  Performed by: Loleta Rose, MD Authorized by: Loleta Rose, MD   Consent:    Consent  obtained:  Verbal   Consent given by:  Patient   Risks discussed:  Infection, poor cosmetic result and poor wound healing Universal protocol:    Patient identity confirmed:  Verbally with patient and arm band Anesthesia:    Anesthesia method:  None Laceration details:    Location:  Face   Face location:  L eyebrow   Length (cm):  1 Skin repair:    Repair method:  Tissue adhesive (surrounding area is abraded) Approximation:    Approximation:  Close Post-procedure details:    Procedure completion:  Tolerated well, no immediate complications     IMPRESSION / MDM / ASSESSMENT AND PLAN / ED COURSE  I reviewed the triage vital signs and the nursing notes.                              Differential diagnosis includes, but is not limited to, fall, abrasion, contusion, fracture, dislocation, intracranial bleed.  Patient's presentation is most consistent with acute presentation with potential threat to life or bodily function.  Labs/studies ordered: Knee x-rays, head CT,  cervical spine CT, hand x-rays  Interventions/Medications given:  Medications  ibuprofen (ADVIL) tablet 600 mg (600 mg Oral Given 08/27/23 0050)    (Note:  hospital course my include additional interventions and/or labs/studies not listed above.)   Patient more or less at his baseline.  He is anxious but not expressing any suicidal ideation or homicidal ideation and acknowledges that he needs to go back to his group home.  His imaging is all reassuring and he is ambulatory.  No indication for lab work given the mechanical nature of his fall.  There is no indication that he has an emergent medical condition that requires additional evaluation or treatment at this time.  I gave my usual and customary follow-up recommendations and return precautions.      FINAL CLINICAL IMPRESSION(S) / ED DIAGNOSES   Final diagnoses:  Fall, initial encounter  Abrasion of forehead, initial encounter  Musculoskeletal pain     Rx / DC Orders   ED Discharge Orders     None        Note:  This document was prepared using Dragon voice recognition software and may include unintentional dictation errors.   Loleta Rose, MD 08/27/23 [phone]

## 2023-08-27 NOTE — ED Notes (Signed)
 spoke with Jimmye Norman at [facility].  [phone]  Informed Lawanda patient was being d/c and they needed to come pick up patient, she member told this RN they might not come get him because they want him admitted, this RN explained he was not being admitted and they legally had to come this patient, Rowan Blase said I needed to speak wit his guardian, this RN told Rowan Blase she would be informing guardian of d/c but they still had to come get patient, staff member was rude and hateful and hung up phone

## 2023-08-27 NOTE — ED Triage Notes (Signed)
 Pt to ed from streets via [employer] for the pt running away from the group home 3 x this week. Pt is caox4, in no acute distress and ambulatory in triage. Pt denies any SI or HI at this time.

## 2023-08-27 NOTE — ED Provider Notes (Signed)
 [facility] Provider Note    Event Date/Time   First MD Initiated Contact with Patient 08/27/23 1802     (approximate)   History   Psychiatric Evaluation   HPI  Derrick Atkins is a 53 year old male presenting to the ER for anxious mood.  Patient was brought in by police after running away from his group home 3 times this week.  Patient reports that he is "worrying about things I should not".  Reports that this causes him to run away.  Had a fall yesterday, seen in our ER with reassuring imaging.  He denies SI, HI, AVH.    Physical Exam   Triage Vital Signs: ED Triage Vitals [08/27/23 1749]  Encounter Vitals Group     BP 131/78     Systolic BP Percentile      Diastolic BP Percentile      Pulse Rate 96     Resp 16     Temp 98 F (36.7 C)     Temp Source Oral     SpO2 100 %     Weight 174 lb 2.6 oz (79 kg)     Height 6\' 4"  (1.93 m)     Head Circumference      Peak Flow      Pain Score 0     Pain Loc      Pain Education      Exclude from Growth Chart     Most recent vital signs: Vitals:   08/27/23 1749 08/27/23 1951  BP: 131/78 139/81  Pulse: 96 (!) 102  Resp: 16 18  Temp: 98 F (36.7 C) 98.2 F (36.8 C)  SpO2: 100% 99%     General: Awake, interactive  CV:  Regular rate, good peripheral perfusion.  Resp:  Unlabored respirations.  Abd:  Nondistended.  Neuro:  Symmetric facial movement, fluid speech   ED Results / Procedures / Treatments   Labs (all labs ordered are listed, but only abnormal results are displayed) Labs Reviewed  COMPREHENSIVE METABOLIC PANEL - Abnormal; Notable for the following components:      Result Value   CO2 18 (*)    Glucose, Bld 162 (*)    BUN 25 (*)    All other components within normal limits  SALICYLATE LEVEL - Abnormal; Notable for the following components:   Salicylate Lvl <7.0 (*)    All other components within normal limits  ACETAMINOPHEN LEVEL - Abnormal; Notable for the following  components:   Acetaminophen (Tylenol), Serum <10 (*)    All other components within normal limits  CBC - Abnormal; Notable for the following components:   RBC 3.47 (*)    Hemoglobin 9.9 (*)    HCT 29.1 (*)    RDW 15.8 (*)    All other components within normal limits  ETHANOL  URINE DRUG SCREEN, QUALITATIVE ([facility] ONLY)     EKG EKG independently reviewed interpreted by myself (ER attending) demonstrates:    RADIOLOGY Imaging independently reviewed and interpreted by myself demonstrates:    PROCEDURES:  Critical Care performed: No  Procedures   MEDICATIONS ORDERED IN ED: Medications - No data to display   IMPRESSION / MDM / ASSESSMENT AND PLAN / ED COURSE  I reviewed the triage vital signs and the nursing notes.  Differential diagnosis includes, but is not limited to, decompensated primary psychiatric disorder, substance-induced mood disorder, acute stress response  Patient's presentation is most consistent with acute presentation with potential threat to life or  bodily function.  53 year old male presenting with anxious mood, repeatedly running away from group home.  Not reporting any acute SI or HI.  No indication for IVC, but with worsening mood and history of primary psychiatric disorder, will consult psychiatry and CT yes for further recommendations.  The patient has been placed in psychiatric observation due to the need to provide a safe environment for the patient while obtaining psychiatric consultation and evaluation, as well as ongoing medical and medication management to treat the patient's condition.  The patient has not been placed under full IVC at this time.  11:58 PM Notified by NP Durwin Nora that she had evaluated patient and he was cleared for discharge from psychiatric perspective.  Labs without critical derangement, patient is stable medically for discharge.  Patient discharged in stable condition.      FINAL CLINICAL IMPRESSION(S) / ED DIAGNOSES    Final diagnoses:  Anxious mood     Rx / DC Orders   ED Discharge Orders     None        Note:  This document was prepared using Dragon voice recognition software and may include unintentional dictation errors.   Trinna Post, MD 08/27/23 [phone]

## 2023-08-27 NOTE — ED Notes (Signed)
 [facility] staff called back and stated they would come get patient immediately

## 2023-08-27 NOTE — ED Notes (Signed)
 MD Ray to triage 3 to clear pt to go to the [facility].

## 2023-08-27 NOTE — Consult Note (Signed)
 [facility] Psychiatric Consult Initial  Patient Name: .Derrick Atkins  MRN: [id number]  DOB: 31-Dec-1970  Consult Order details:  Orders (From admission, onward)     Start     Ordered   08/27/23 1815  IP CONSULT TO PSYCHIATRY       Ordering Provider: Trinna Post, MD  Provider:  (Not yet assigned)  Question Answer Comment  Consult Timeframe ROUTINE - requires response within 24 hours   Reason for Consult? Consult for medication management   Contact phone number where the requesting provider can be reached [phone]      08/27/23 1814   08/27/23 1801  CONSULT TO CALL ACT TEAM       Ordering Provider: Trinna Post, MD  Provider:  (Not yet assigned)  Question:  Reason for Consult?  Answer:  psych   08/27/23 1800             Mode of Visit:     Psychiatry Consult Evaluation  Service Date: August 27, 2023 LOS:  LOS: 0 days  Chief Complaint Behavioral concerns  Primary Psychiatric Diagnoses  Principal Problem: Schizophrenia, paranoid, chronic (HCC)   Assessment  Derrick Atkins, 53 y.o., male patient seen via tele health by TTS and this provider; chart reviewed and consulted with Dr. Rosalia Hammers on 08/27/23. Presented to the ED for consistently running away from the group home and placing him in potentially dangerous situations. He carries the psychiatric diagnoses of schizophrenia and has a past medical history of  acute encephalitis. On evaluation Derrick Atkins reports that he left the group home to go where he used to live.  He denies SI/HI/AVH.  He denies having issues at the group home.  He says he runs away from the group home to be alone.  He says he bored at the group home since his computer broke. He denies depression, denies issues with sleep, and appetite. Patient reports that he is taking his medication as prescribed.  His main complaint is not being able to relate to the residents there and boredom.  He says all he does is twiddle his thumbs since his computer became  inoperable. He says he used to watch [employer] and play video games but now he does nothing.   He says he would nit run away again if he were to return back to the group home.    Per ed nurse note, RN spoke with Derrick [facility] at [facility].  [address] [phone] Per her, the patient has been running away, running out into the road in the middle of traffic. He has had his meds adjusted but they feel it needs to be done again as they are not controlling his behaviour and they cannot lock him in their facility or watch him 100% of the time. They feel he needs a psych eval before being sent back to their facility.  Pt is a ward of the state and under DSS as his guardian    Recommendations:  patient is psych cleared and is able to safely return to the group home.    Dr. Rosalia Hammers informed of above recommendation and disposition    Plan   ## Psychiatric Medication Recommendations:  Continue with medication as prescribed and follow-up with psychiatrist for med changes  ## Medical Decision Making Capacity: Not specifically addressed in this encounter  ## Further Work-up:  Derrick Atkins was admitted to [facility] ER for Schizophrenia, paranoid, chronic (HCC), crisis management, and stabilization. Routine labs ordered,  which include  Lab Orders         Comprehensive metabolic panel         Salicylate level         Acetaminophen level         cbc         Urine Drug Screen, Qualitative         Ethanol    Medication Management: Medications started  Will maintain observation checks every 15 minutes for safety. Psychosocial education regarding relapse prevention and self-care; social and communication  Social work will consult with family for collateral information and discuss discharge and follow up plan.   ## Disposition:-- There are no psychiatric contraindications to discharge at this time  ## Behavioral / Environmental: - No specific recommendations at this time.     ##  Safety and Observation Level:  - Based on my clinical evaluation, I estimate the patient to be at low risk of self harm in the current setting. - At this time, we recommend  routine. This decision is based on my review of the chart including patient's history and current presentation, interview of the patient, mental status examination, and consideration of suicide risk including evaluating suicidal ideation, plan, intent, suicidal or self-harm behaviors, risk factors, and protective factors. This judgment is based on our ability to directly address suicide risk, implement suicide prevention strategies, and develop a safety plan while the patient is in the clinical setting. Please contact our team if there is a concern that risk level has changed.  CSSR Risk Category:C-SSRS RISK CATEGORY: No Risk  Suicide Risk Assessment: Patient has following modifiable risk factors for suicide: social isolation, which we are addressing by discharging back to the group home. Patient has following non-modifiable or demographic risk factors for suicide: male gender and psychiatric hospitalization Patient has the following protective factors against suicide: Access to outpatient mental health care and Frustration tolerance  Thank you for this consult request. Recommendations have been communicated to the primary team.  We will recommend discharge  at this time.   Jearld Lesch, NP       History of Present Illness  Relevant Aspects of [facility] ED Course:  Admitted on 08/27/2023 for running away from the group home.   Patient Report:  Patient reports that he left the group home to go where he used to live.  Patient reports that he is taking his medication.  He denies SI/HI/AVH.  He denies having issues at the group home.  He says he runs away from the group home to be alone.  He says he bored at the group home since his computer broke. He denies depression, denies issues with sleep, and appetite.  His main  complaint is not being able to relate to the residents there and boredom.  He says all he does is twiddle his thumbs since his computer broke. He says he used to watch [employer] and play video games.    Psych ROS:  Depression: denies Anxiety:  denies    Review of Systems  Psychiatric/Behavioral:  Negative for depression, hallucinations, substance abuse and suicidal ideas. The patient is not nervous/anxious and does not have insomnia.   All other systems reviewed and are negative.    Psychiatric and Social History  Psychiatric History:  Information collected from patient and chart review   Exam Findings  Physical Exam:  Vital Signs:  Temp:  [98 F (36.7 C)-98.2 F (36.8 C)] 98.2 F (36.8 C) (02/15 1951) Pulse Rate:  [  96-102] 102 (02/15 1951) Resp:  [16-18] 18 (02/15 1951) BP: (131-139)/(78-81) 139/81 (02/15 1951) SpO2:  [99 %-100 %] 99 % (02/15 1951) Weight:  [79 kg] 79 kg (02/15 1749) Blood pressure 139/81, pulse (!) 102, temperature 98.2 F (36.8 C), temperature source Oral, resp. rate 18, height 6\' 4"  (1.93 m), weight 79 kg, SpO2 99%. Body mass index is 21.2 kg/m.  Physical Exam Vitals and nursing note reviewed.  Constitutional:      Appearance: Normal appearance.  HENT:     Head: Normocephalic and atraumatic.     Nose: Nose normal.     Mouth/Throat:     Pharynx: Oropharynx is clear.  Eyes:     Pupils: Pupils are equal, round, and reactive to light.  Pulmonary:     Effort: Pulmonary effort is normal.  Musculoskeletal:        General: Normal range of motion.     Cervical back: Normal range of motion.  Skin:    General: Skin is dry.  Neurological:     Mental Status: He is oriented to person, place, and time.  Psychiatric:        Attention and Perception: Attention and perception normal.        Mood and Affect: Mood and affect normal.        Speech: Speech normal.        Behavior: Behavior normal. Behavior is cooperative.        Thought Content: Thought  content normal.        Cognition and Memory: Cognition and memory normal.        Judgment: Judgment is impulsive.     Mental Status Exam: General Appearance: Casual  Orientation:  Full (Time, Place, and Person)  Memory:  Immediate;   Fair Recent;   Fair  Concentration:  Concentration: Good and Attention Span: Good  Recall:  Good  Attention  Good  Eye Contact:  Good  Speech:  Normal Rate  Language:  Fair  Volume:  Normal  Mood: euthymic  Affect:  Appropriate and Congruent  Thought Process:  Coherent  Thought Content:  Logical  Suicidal Thoughts:  No  Homicidal Thoughts:  No  Judgement:  Fair  Insight:  Fair  Psychomotor Activity:  Normal  Akathisia:  NA  Fund of Knowledge:  Fair      Assets:  Solicitor Social Support  Cognition:  WNL  ADL's:  Intact  AIMS (if indicated):        Other History   These have been pulled in through the EMR, reviewed, and updated if appropriate.  Family History:  The patient's family history includes Heart attack in his father; Heart disease in his father.  Medical History: Past Medical History:  Diagnosis Date   Cerebral palsy (HCC)    Depression    Diabetes mellitus without complication (HCC)    Hypertension    Kidney stones    Schizoaffective disorder (HCC)    Scoliosis     Surgical History: Past Surgical History:  Procedure Laterality Date   BOWEL RESECTION     CATARACT EXTRACTION W/PHACO Right 09/15/2022   Procedure: CATARACT EXTRACTION PHACO AND INTRAOCULAR LENS PLACEMENT (IOC) RIGHT DIABETIC VISION BLUE HEALON 5  15.79  01:31.9;  Surgeon: Lockie Mola, MD;  Location: [facility];  Service: Ophthalmology;  Laterality: Right;  Latex Diabetic   INCISION AND DRAINAGE ABSCESS Left 12/20/2022   Procedure: INCISION AND DRAINAGE ABSCESS;  Surgeon: Campbell Lerner, MD;  Location: [facility];  Service: General;  Laterality: Left;   LAPAROTOMY N/A 05/15/2020    Procedure: EXPLORATORY LAPAROTOMY;  Surgeon: Henrene Dodge, MD;  Location: [facility];  Service: General;  Laterality: N/A;     Medications:  No current facility-administered medications for this encounter.  Current Outpatient Medications:    ABILIFY MAINTENA 400 MG PRSY prefilled syringe, Inject 400 mg into the muscle every 28 (twenty-eight) days. Due 11/15-11/20, Disp: , Rfl:    acetaminophen (TYLENOL) 500 MG tablet, Take 1,000 mg by mouth 3 (three) times daily as needed for mild pain or moderate pain., Disp: , Rfl:    ascorbic acid (VITAMIN C) 500 MG tablet, Take 1 tablet (500 mg total) by mouth daily., Disp: 30 tablet, Rfl: 3   aspirin EC 81 MG tablet, Take 1 tablet (81 mg total) by mouth daily. Swallow whole., Disp: 30 tablet, Rfl: 12   benztropine (COGENTIN) 1 MG tablet, Take 1 tablet (1 mg total) by mouth daily., Disp: 30 tablet, Rfl: 1   cholecalciferol (VITAMIN D3) 25 MCG (1000 UNIT) tablet, Take 1,000 Units by mouth daily., Disp: , Rfl:    fenofibrate 54 MG tablet, Take 54 mg by mouth daily., Disp: , Rfl:    folic acid (FOLVITE) 1 MG tablet, Take 1 tablet (1 mg total) by mouth daily., Disp: 30 tablet, Rfl: 3   furosemide (LASIX) 20 MG tablet, Take 20 mg by mouth in the morning., Disp: , Rfl:    gemfibrozil (LOPID) 600 MG tablet, Take 600 mg by mouth 2 (two) times daily before a meal., Disp: , Rfl:    hydrOXYzine (ATARAX) 50 MG tablet, Take 50 mg by mouth 3 (three) times daily., Disp: , Rfl:    iron polysaccharides (NIFEREX) 150 MG capsule, Take 1 capsule (150 mg total) by mouth daily., Disp: 30 capsule, Rfl: 3   lamoTRIgine (LAMICTAL) 25 MG tablet, Take 25 mg by mouth 2 (two) times daily., Disp: , Rfl:    levothyroxine (SYNTHROID) 88 MCG tablet, Take 1 tablet (88 mcg total) by mouth daily at 6 (six) AM., Disp: 30 tablet, Rfl: 1   loperamide (IMODIUM) 2 MG capsule, Take 4 mg by mouth 2 (two) times daily., Disp: , Rfl:    metoprolol tartrate (LOPRESSOR) 25 MG tablet, Take 1 tablet (25 mg  total) by mouth 2 (two) times daily. (Patient taking differently: Take 25 mg by mouth 2 (two) times daily. Hold if SBP < 110 or DBP < 60), Disp: 60 tablet, Rfl: 2   OLANZapine (ZYPREXA) 10 MG tablet, Take 10 mg by mouth at bedtime. (Take with 20mg  tablet to equal 30mg  total), Disp: , Rfl:    OLANZapine (ZYPREXA) 20 MG tablet, Take 1 tablet (20 mg total) by mouth at bedtime. (Patient taking differently: Take 20 mg by mouth at bedtime. (Take with 10mg  tablet to equal 30mg  total)), Disp: 30 tablet, Rfl: 1   pantoprazole (PROTONIX) 40 MG tablet, Take 1 tablet (40 mg total) by mouth daily before breakfast., Disp: 30 tablet, Rfl: 0   polyethylene glycol (MIRALAX / GLYCOLAX) 17 g packet, Take 17 g by mouth daily., Disp: , Rfl:    potassium chloride SA (KLOR-CON M) 20 MEQ tablet, Take 20 mEq by mouth daily., Disp: , Rfl:    rosuvastatin (CRESTOR) 5 MG tablet, Take 5 mg by mouth daily., Disp: , Rfl:    senna (SENOKOT) 8.6 MG TABS tablet, Take 2 tablets by mouth at bedtime., Disp: , Rfl:    VENTOLIN HFA 108 (90 Base) MCG/ACT inhaler, Inhale 2 puffs  into the lungs every 4 (four) hours as needed., Disp: , Rfl:   Allergies: Allergies  Allergen Reactions   Valproic Acid And Related Other (See Comments)    Hyperammonemic encephalopathy   Phenytoin Sodium Extended Other (See Comments) and Nausea And Vomiting   Prednisone Hives and Nausea And Vomiting   Latex Hives, Nausea And Vomiting and Rash    Jakeb Lamping Damaris Hippo, NP

## 2023-08-27 NOTE — Discharge Instructions (Signed)
 You have been seen in the Emergency Department (ED) today for a fall.  Your work up does not show any concerning injuries.  Please take over-the-counter ibuprofen and/or Tylenol as needed for your pain (unless you have an allergy or your doctor as told you not to take them), or take any prescribed medication as instructed.  Please keep your forehead wound clean and dry.  It will need time to heal on its own.  We recommend you put a bandage over it and change the bandage twice a day.  It is okay to shower and wash the wound gently.  There is a little bit of skin glue on it so you should not use antibiotic ointment.  Please follow up with your doctor regarding today's Emergency Department (ED) visit and your recent fall.    Return to the ED if you have any headache, confusion, slurred speech, weakness/numbness of any arm or leg, or any increased pain.

## 2023-08-27 NOTE — ED Notes (Signed)
 Spoke with guardian (DSS on call) and informed of d/c, Dss guardian [place] Dragon stated the [facility] was required to come pick up patient

## 2023-08-27 NOTE — ED Notes (Addendum)
 This RN spoke with Derrick Atkins at [facility].  [address] [phone]  Per her, the patient has been running away, running out into the road in the middle of traffic. He has had his meds adjusted but they feel it needs to be done again as they are not controlling his behaviour and they cannot lock him in their facility or watch him 100% of the time. They feel he needs a psych eval before being sent back to their facility.  Pt is a ward of the state and under DSS as his guardian.

## 2023-08-27 NOTE — ED Notes (Signed)
 DSS called for [facility]

## 2023-08-27 NOTE — ED Notes (Signed)
 ED Provider at bedside.

## 2023-08-27 NOTE — ED Notes (Signed)
 Attempted to call [facility], no answer

## 2023-08-27 NOTE — ED Notes (Signed)
 Fluids provided

## 2023-08-27 NOTE — ED Notes (Signed)
 Derma-Bond pen provided to MD York Cerise.

## 2023-08-27 NOTE — ED Notes (Signed)
 Attempted to call [facility] about d/c and pickup

## 2023-08-27 NOTE — ED Notes (Signed)
 Call placed to patients group home with no answer.  Unable to leave message.  Will continue to try to reach group home.

## 2023-08-27 NOTE — ED Notes (Addendum)
 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

## 2023-08-27 NOTE — ED Notes (Signed)
 Spoke with Micael Hampshire from patients group home who states she is calling a cab to transport patient from ED to group home.

## 2023-08-27 NOTE — Discharge Instructions (Signed)
 Follow-up with your outpatient doctors as directed.  Return to the ER for new or worsening symptoms.

## 2023-08-28 NOTE — ED Notes (Signed)
 [facility] called to say they are outside , will walk patient out

## 2023-08-29 ENCOUNTER — Emergency Department
Admission: EM | Admit: 2023-08-29 | Discharge: 2023-08-31 | Disposition: A | Discharge status: Home / Self Care | Payer: Medicare Other | Attending: Emergency Medicine | Admitting: Emergency Medicine

## 2023-08-29 ENCOUNTER — Emergency Department: Payer: Medicare Other

## 2023-08-29 DIAGNOSIS — R4689 Other symptoms and signs involving appearance and behavior: Secondary | ICD-10-CM | POA: Diagnosis not present

## 2023-08-29 DIAGNOSIS — F0390 Unspecified dementia without behavioral disturbance: Secondary | ICD-10-CM | POA: Diagnosis not present

## 2023-08-29 DIAGNOSIS — F4325 Adjustment disorder with mixed disturbance of emotions and conduct: Secondary | ICD-10-CM | POA: Insufficient documentation

## 2023-08-29 DIAGNOSIS — F200 Paranoid schizophrenia: Secondary | ICD-10-CM | POA: Diagnosis present

## 2023-08-29 DIAGNOSIS — R443 Hallucinations, unspecified: Secondary | ICD-10-CM | POA: Insufficient documentation

## 2023-08-29 LAB — URINE DRUG SCREEN, QUALITATIVE (ARMC ONLY)
Amphetamines, Ur Screen: NOT DETECTED
Barbiturates, Ur Screen: NOT DETECTED
Benzodiazepine, Ur Scrn: NOT DETECTED
Cannabinoid 50 Ng, Ur ~~LOC~~: NOT DETECTED
Cocaine Metabolite,Ur ~~LOC~~: NOT DETECTED
MDMA (Ecstasy)Ur Screen: NOT DETECTED
Methadone Scn, Ur: NOT DETECTED
Opiate, Ur Screen: NOT DETECTED
Phencyclidine (PCP) Ur S: NOT DETECTED
Tricyclic, Ur Screen: POSITIVE — AB

## 2023-08-29 LAB — CBC
HCT: 28 % — ABNORMAL LOW (ref 39.0–52.0)
Hemoglobin: 9.5 g/dL — ABNORMAL LOW (ref 13.0–17.0)
MCH: 28.4 pg (ref 26.0–34.0)
MCHC: 33.9 g/dL (ref 30.0–36.0)
MCV: 83.8 fL (ref 80.0–100.0)
Platelets: 171 10*3/uL (ref 150–400)
RBC: 3.34 MIL/uL — ABNORMAL LOW (ref 4.22–5.81)
RDW: 15.7 % — ABNORMAL HIGH (ref 11.5–15.5)
WBC: 6.8 10*3/uL (ref 4.0–10.5)
nRBC: 0 % (ref 0.0–0.2)

## 2023-08-29 LAB — COMPREHENSIVE METABOLIC PANEL
ALT: 18 U/L (ref 0–44)
AST: 27 U/L (ref 15–41)
Albumin: 4 g/dL (ref 3.5–5.0)
Alkaline Phosphatase: 51 U/L (ref 38–126)
Anion gap: 13 (ref 5–15)
BUN: 22 mg/dL — ABNORMAL HIGH (ref 6–20)
CO2: 19 mmol/L — ABNORMAL LOW (ref 22–32)
Calcium: 9.5 mg/dL (ref 8.9–10.3)
Chloride: 106 mmol/L (ref 98–111)
Creatinine, Ser: 1.08 mg/dL (ref 0.61–1.24)
GFR, Estimated: >60 mL/min (ref >60)
Glucose, Bld: 103 mg/dL — ABNORMAL HIGH (ref 70–99)
Potassium: 3.9 mmol/L (ref 3.5–5.1)
Sodium: 138 mmol/L (ref 135–145)
Total Bilirubin: 1.1 mg/dL (ref 0.0–1.2)
Total Protein: 6.9 g/dL (ref 6.5–8.1)

## 2023-08-29 LAB — ETHANOL: Alcohol, Ethyl (B): <10 mg/dL (ref <10)

## 2023-08-29 LAB — SALICYLATE LEVEL: Salicylate Lvl: <7 mg/dL — ABNORMAL LOW (ref 7.0–30.0)

## 2023-08-29 LAB — ACETAMINOPHEN LEVEL: Acetaminophen (Tylenol), Serum: <10 ug/mL — ABNORMAL LOW (ref 10–30)

## 2023-08-29 NOTE — ED Provider Notes (Signed)
 [facility] Provider Note   Event Date/Time   First MD Initiated Contact with Patient 08/29/23 1535     (approximate) History  Aggressive Behavior and IVC  HPI Derrick Atkins is a 53 y.o. male with past medical history of dementia schizophrenia who presents under involuntary commitment from a group home after patient was attempting to flee from the group home.  Patient has also reportedly been refusing treatments and had delusions and hallucinations however staff cannot explain what these delusions or hallucinations have been.  Per IVC, patient has also been threatening staff with violence. ROS: Unable to assess   Physical Exam  Triage Vital Signs: ED Triage Vitals  Encounter Vitals Group     BP 08/29/23 1504 120/69     Systolic BP Percentile --      Diastolic BP Percentile --      Pulse Rate 08/29/23 1504 74     Resp 08/29/23 1504 16     Temp 08/29/23 1504 97.9 F (36.6 C)     Temp Source 08/29/23 1504 Oral     SpO2 08/29/23 1504 100 %     Weight 08/29/23 1512 174 lb (78.9 kg)     Height 08/29/23 1512 6\' 4"  (1.93 m)     Head Circumference --      Peak Flow --      Pain Score 08/29/23 1505 0     Pain Loc --      Pain Education --      Exclude from Growth Chart --    Most recent vital signs: Vitals:   08/29/23 1504  BP: 120/69  Pulse: 74  Resp: 16  Temp: 97.9 F (36.6 C)  SpO2: 100%   General: Awake, cooperative CV:  Good peripheral perfusion.  Resp:  Normal effort.  Abd:  No distention.  Other:  Disheveled middle-aged Caucasian male resting comfortably in no acute distress.  2 Centimeter superficial abrasion to the left lateral eyebrow ED Results / Procedures / Treatments  Labs (all labs ordered are listed, but only abnormal results are displayed) Labs Reviewed  COMPREHENSIVE METABOLIC PANEL - Abnormal; Notable for the following components:      Result Value   CO2 19 (*)    Glucose, Bld 103 (*)    BUN 22 (*)    All other components  within normal limits  SALICYLATE LEVEL - Abnormal; Notable for the following components:   Salicylate Lvl <7.0 (*)    All other components within normal limits  ACETAMINOPHEN LEVEL - Abnormal; Notable for the following components:   Acetaminophen (Tylenol), Serum <10 (*)    All other components within normal limits  CBC - Abnormal; Notable for the following components:   RBC 3.34 (*)    Hemoglobin 9.5 (*)    HCT 28.0 (*)    RDW 15.7 (*)    All other components within normal limits  URINE DRUG SCREEN, QUALITATIVE ([facility] ONLY) - Abnormal; Notable for the following components:   Tricyclic, Ur Screen POSITIVE (*)    All other components within normal limits  ETHANOL  PROCEDURES: Critical Care performed: No Procedures MEDICATIONS ORDERED IN ED: Medications - No data to display IMPRESSION / MDM / ASSESSMENT AND PLAN / ED COURSE  I reviewed the triage vital signs and the nursing notes.                             The patient is on  the cardiac monitor to evaluate for evidence of arrhythmia and/or significant heart rate changes. Patient's presentation is most consistent with acute presentation with potential threat to life or bodily function. Patient presents under IVC for hallucinations/delusions. Thoughts are disorganized. No history of prior suicide attempt, and no SI or HI at this time. Clinically w/ no overt toxidrome, low suspicion for ingestion given hx and exam Thoughts unlikely 2/2 anemia, hypothyroidism, infection, or ICH. Patient's decision making capacity is compromised and they are unable to perform all ADL's (additionally they are without appropriate caretakers to assist through this deficit).  Consult: Psychiatry to evaluate patient for grave disability Disposition: Pending psychiatric evaluation  Care of this patient will be signed out the oncoming physician.  All pertinent patient formation is conveyed and all questions answered.  All further care and disposition decisions  will be made by the oncoming physician.   FINAL CLINICAL IMPRESSION(S) / ED DIAGNOSES   Final diagnoses:  Aggressive behavior  Hallucinations   Rx / DC Orders   ED Discharge Orders     None      Note:  This document was prepared using Dragon voice recognition software and may include unintentional dictation errors.   Merwyn Katos, MD 08/29/23 2213

## 2023-08-29 NOTE — Consult Note (Signed)
 [facility] Consult Note  Patient Name: Derrick Atkins MRN: [id number] DOB: 05-Jun-1971 DATE OF Consult: 08/29/2023  PRIMARY PSYCHIATRIC DIAGNOSES  1.  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  Paranoid Schizophrenia    RECOMMENDATIONS  Discharge back to group home Medication recommendations: Continue home meds as prescribed. continue medications as prescribed Non-Medication/therapeutic recommendations: Follow up with outpatient provider Communication: Treatment team members (and family members if applicable) who were involved in treatment/care discussions and planning, and with whom we spoke or engaged with via secure text/chat, include the following:  patient's treatment team. Thank you for involving [place] in the care of this patient. If you have any additional questions or concerns, please call [phone] and ask for me or the provider on-call.  TELEPSYCHIATRY ATTESTATION & CONSENT  As the provider for this telehealth consult, I attest that I verified the patient's identity using two separate identifiers, introduced myself to the patient, provided my credentials, disclosed my location, and performed this encounter via a HIPAA-compliant, real-time, face-to-face, two-way, interactive audio and video platform and with the full consent and agreement of the patient (or guardian as applicable.)  Patient physical location: [place]. Telehealth provider physical location: home office in state of [place].  Video start time: 19-Sep-2203 [facility] Time) Video end time: 2218/09/18 [facility] Time)  IDENTIFYING DATA  Derrick Atkins is a 53 y.o. year-old male for whom a psychiatric consultation has been ordered by the primary provider. The patient was identified using two separate identifiers.  CHIEF COMPLAINT/REASON FOR CONSULT  "I ran away because the lady there was mean to me"  HISTORY OF PRESENT ILLNESS (HPI)  Per ER notes, patient, a 53 year old male with a history of dementia schizophrenia  who presents under involuntary commitment from a group home after patient was attempting to flee from the group home.  Patient has also reportedly been refusing treatments and had delusions and hallucinations however staff cannot explain what these delusions or hallucinations have been.  Per IVC, patient has also been threatening staff with violence.  During this evaluation, patient presented with confusion regarding his current living situation, expressing a desire to return to an apartment he had vacated two years prior. It was noted that he attempted to return to this previous residence, unaware that it no longer belonged to him, indicating possible disorientation or memory issues. He mentioned living in a group home for the past two years, which he attempted to leave, suggesting dissatisfaction or discomfort with his current living arrangements. He reports that staff at the group name Nicholos Johns or Myriam Jacobson is very mean to him and that why he was running away.  During the session, the patient was able to state the date and the President of the [place]. He reported a recent fall while attempting to cross the street, resulting in an injury to his right hand, though he was uncertain if it was sprained or broken and bruising around his left eye. This incident occurred while he was trying to return to his former residence.  When questioned about auditory or visual hallucinations, the patient initially denied hearing voices but later mentioned experiencing dreams and visions, which he described as lacking copyright, suggesting possible delusional thinking or misinterpretation of reality. He denied any current suicidal ideations or thoughts of harming others, though he acknowledged a previous hospitalization in 09/19/2003 following the death of his father, during which he experienced significant sadness.  The patient reported a significant weight loss, now weighing approximately 174 pounds, which he attributed to  inadequate nutrition at the group home. He expressed dissatisfaction with the food provided, noting a lack of snacks and a preference for vegetables, which may indicate dietary restrictions or preferences not being met.  Throughout the conversation, the patient was reminded of the consequences of repeatedly leaving the group home, including the potential for placement in a locked unit. He expressed a desire to return home, indicating some level of compliance with the current living situation, though underlying issues with the group home environment remain apparent. The evaluation concluded with a plan for the patient to return to the group home, with an emphasis on the importance of not absconding to avoid further complications.  PAST PSYCHIATRIC HISTORY  - Admitted to mental health hospital in 2005/03/14following father's death - No history of self-harm or suicidal attempts - Group staff had reported patient being violent but he denies - He denies history of suicide attempt  PAST MEDICAL HISTORY  Past Medical History:  Diagnosis Date   Cerebral palsy (HCC)    Depression    Diabetes mellitus without complication (HCC)    Hypertension    Kidney stones    Schizoaffective disorder (HCC)    Scoliosis      HOME MEDICATIONS  PTA Medications  Medication Sig   aspirin EC 81 MG tablet Take 1 tablet (81 mg total) by mouth daily. Swallow whole.   OLANZapine (ZYPREXA) 20 MG tablet Take 1 tablet (20 mg total) by mouth at bedtime. (Patient taking differently: Take 20 mg by mouth at bedtime. (Take with 10mg  tablet to equal 30mg  total))   levothyroxine (SYNTHROID) 88 MCG tablet Take 1 tablet (88 mcg total) by mouth daily at 6 (six) AM.   benztropine (COGENTIN) 1 MG tablet Take 1 tablet (1 mg total) by mouth daily.   ABILIFY MAINTENA 400 MG PRSY prefilled syringe Inject 400 mg into the muscle every 28 (twenty-eight) days. Due 11/15-11/20   polyethylene glycol (MIRALAX / GLYCOLAX) 17 g packet Take 17 g  by mouth daily.   cholecalciferol (VITAMIN D3) 25 MCG (1000 UNIT) tablet Take 1,000 Units by mouth daily.   OLANZapine (ZYPREXA) 10 MG tablet Take 10 mg by mouth at bedtime. (Take with 20mg  tablet to equal 30mg  total)   folic acid (FOLVITE) 1 MG tablet Take 1 tablet (1 mg total) by mouth daily.   iron polysaccharides (NIFEREX) 150 MG capsule Take 1 capsule (150 mg total) by mouth daily.   ascorbic acid (VITAMIN C) 500 MG tablet Take 1 tablet (500 mg total) by mouth daily.   senna (SENOKOT) 8.6 MG TABS tablet Take 2 tablets by mouth at bedtime.   acetaminophen (TYLENOL) 500 MG tablet Take 1,000 mg by mouth 3 (three) times daily as needed for mild pain or moderate pain.   fenofibrate 54 MG tablet Take 54 mg by mouth daily.   rosuvastatin (CRESTOR) 5 MG tablet Take 5 mg by mouth daily.   gemfibrozil (LOPID) 600 MG tablet Take 600 mg by mouth 2 (two) times daily before a meal.   VENTOLIN HFA 108 (90 Base) MCG/ACT inhaler Inhale into the lungs every 4 (four) hours as needed.   loperamide (IMODIUM) 2 MG capsule Take 4 mg by mouth 2 (two) times daily.   potassium chloride SA (KLOR-CON M) 20 MEQ tablet Take 20 mEq by mouth daily.   metoprolol tartrate (LOPRESSOR) 25 MG tablet Take 1 tablet (25 mg total) by mouth 2 (two) times daily.   lamoTRIgine (LAMICTAL) 25 MG tablet Take 25 mg by mouth  2 (two) times daily.   pantoprazole (PROTONIX) 40 MG tablet Take 1 tablet (40 mg total) by mouth daily before breakfast.   Cholecalciferol 25 MCG (1000 UT) TBDP Take 1 tablet by mouth daily.   linaclotide (LINZESS) 145 MCG CAPS capsule Take 145 mcg by mouth daily before breakfast.   FLUoxetine (PROZAC) 20 MG capsule Take 20 mg by mouth daily.   furosemide (LASIX) 20 MG tablet Take 20 mg by mouth in the morning. (Patient not taking: Reported on 08/29/2023)   hydrOXYzine (ATARAX) 50 MG tablet Take 50 mg by mouth 3 (three) times daily. (Patient not taking: Reported on 08/29/2023)     ALLERGIES  Allergies  Allergen  Reactions   Valproic Acid And Related Other (See Comments)    Hyperammonemic encephalopathy   Phenytoin Sodium Extended Other (See Comments) and Nausea And Vomiting   Prednisone Hives and Nausea And Vomiting   Latex Hives, Nausea And Vomiting and Rash    SOCIAL & SUBSTANCE USE HISTORY  Social History   Socioeconomic History   Marital status: Single    Spouse name: Not on file   Number of children: Not on file   Years of education: Not on file   Highest education level: Not on file  Occupational History   Not on file  Tobacco Use   Smoking status: Never    Passive exposure: Never   Smokeless tobacco: Never  Vaping Use   Vaping status: Never Used  Substance and Sexual Activity   Alcohol use: No   Drug use: No   Sexual activity: Not Currently  Other Topics Concern   Not on file  Social History Narrative   Not on file   Social Drivers of Health   Financial Resource Strain: Not on file  Food Insecurity: No Food Insecurity (05/25/2023)   Hunger Vital Sign    Worried About Running Out of Food in the Last Year: Never true    Ran Out of Food in the Last Year: Never true  Transportation Needs: No Transportation Needs (05/25/2023)   PRAPARE - Administrator, Civil Service (Medical): No    Lack of Transportation (Non-Medical): No  Recent Concern: Transportation Needs - Unmet Transportation Needs (04/14/2023)   PRAPARE - Administrator, Civil Service (Medical): Yes    Lack of Transportation (Non-Medical): Yes  Physical Activity: Not on file  Stress: Not on file  Social Connections: Not on file   Social History   Tobacco Use  Smoking Status Never   Passive exposure: Never  Smokeless Tobacco Never   Social History   Substance and Sexual Activity  Alcohol Use No   Social History   Substance and Sexual Activity  Drug Use No    Additional pertinent information .  FAMILY HISTORY  Family History  Problem Relation Age of Onset   Heart disease  Father    Heart attack Father    Family Psychiatric History (if known):  unknown  MENTAL STATUS EXAM (MSE)  Mental Status Exam: General Appearance: Disheveled  Orientation:  Full (Time, Place, and Person)  Memory:  Immediate;   Fair Recent;   Fair Remote;   Fair  Concentration:  Concentration: Poor and Attention Span: Poor  Recall:  Poor  Attention  Poor  Eye Contact:  Fair  Speech:  Clear and Coherent  Language:  Fair  Volume:  Normal  Mood: "okay"  Affect:  Blunt  Thought Process:  Coherent  Thought Content:  revealed a preoccupation with  returning to his previous apartment, which he no longer owns, and there was mention of experiencing dreams and visions, which he described as lacking copyright, suggesting possible delusional thinking or misinterpretation of reality  Suicidal Thoughts:  No  Homicidal Thoughts:  No  Judgement:  limited  Insight:  limited  Psychomotor Activity:  unable to assess  Akathisia:    Fund of Knowledge:  Poor    Assets:  Housing  Cognition:  Impaired,  Moderate  ADL's:  Impaired  AIMS (if indicated):       VITALS  Blood pressure 120/69, pulse 74, temperature 97.9 F (36.6 C), temperature source Oral, resp. rate 16, height 6\' 4"  (1.93 m), weight 78.9 kg, SpO2 100%.  LABS  Admission on 08/29/2023  Component Date Value Ref Range Status   Sodium 08/29/2023 138  135 - 145 mmol/L Final   Potassium 08/29/2023 3.9  3.5 - 5.1 mmol/L Final   Chloride 08/29/2023 106  98 - 111 mmol/L Final   CO2 08/29/2023 19 (L)  22 - 32 mmol/L Final   Glucose, Bld 08/29/2023 103 (H)  70 - 99 mg/dL Final   Glucose reference range applies only to samples taken after fasting for at least 8 hours.   BUN 08/29/2023 22 (H)  6 - 20 mg/dL Final   Creatinine, Ser 08/29/2023 1.08  0.61 - 1.24 mg/dL Final   Calcium 78/29/5621 9.5  8.9 - 10.3 mg/dL Final   Total Protein 30/86/5784 6.9  6.5 - 8.1 g/dL Final   Albumin 69/62/9528 4.0  3.5 - 5.0 g/dL Final   AST 41/32/4401 27  15  - 41 U/L Final   ALT 08/29/2023 18  0 - 44 U/L Final   Alkaline Phosphatase 08/29/2023 51  38 - 126 U/L Final   Total Bilirubin 08/29/2023 1.1  0.0 - 1.2 mg/dL Final   GFR, Estimated 08/29/2023 >60  >60 mL/min Final   Comment: (NOTE) Calculated using the CKD-EPI Creatinine Equation (2021)    Anion gap 08/29/2023 13  5 - 15 Final   Performed at [facility], [address] [place], [place] [address]   Alcohol, Ethyl (B) 08/29/2023 <10  <10 mg/dL Final   Comment: (NOTE) Lowest detectable limit for serum alcohol is 10 mg/dL.  For medical purposes only. Performed at [facility], [address] [place], [place] [address]    Salicylate Lvl 08/29/2023 <7.0 (L)  7.0 - 30.0 mg/dL Final   Performed at [facility], [address] [place], [place] [address]   Acetaminophen (Tylenol), Serum 08/29/2023 <10 (L)  10 - 30 ug/mL Final   Comment: (NOTE) Therapeutic concentrations vary significantly. A range of 10-30 ug/mL  may be an effective concentration for many patients. However, some  are best treated at concentrations outside of this range. Acetaminophen concentrations >150 ug/mL at 4 hours after ingestion  and >50 ug/mL at 12 hours after ingestion are often associated with  toxic reactions.  Performed at [facility], [address] [place], [place] [address]    WBC 08/29/2023 6.8  4.0 - 10.5 K/uL Final   RBC 08/29/2023 3.34 (L)  4.22 - 5.81 MIL/uL Final   Hemoglobin 08/29/2023 9.5 (L)  13.0 - 17.0 g/dL Final   HCT 38/75/6433 28.0 (L)  39.0 - 52.0 % Final   MCV 08/29/2023 83.8  80.0 - 100.0 fL Final   MCH 08/29/2023 28.4  26.0 - 34.0 pg Final   MCHC 08/29/2023 33.9  30.0 - 36.0 g/dL Final  RDW 08/29/2023 15.7 (H)  11.5 - 15.5 % Final   Platelets 08/29/2023 171  150 - 400 K/uL Final   nRBC 08/29/2023 0.0  0.0 - 0.2 % Final   Performed at [facility], [address] [place], [place] [address]   Tricyclic, Ur Screen 08/29/2023  POSITIVE (A)  NONE DETECTED Final   Amphetamines, Ur Screen 08/29/2023 NONE DETECTED  NONE DETECTED Final   MDMA (Ecstasy)Ur Screen 08/29/2023 NONE DETECTED  NONE DETECTED Final   Cocaine Metabolite,Ur [place] 08/29/2023 NONE DETECTED  NONE DETECTED Final   Opiate, Ur Screen 08/29/2023 NONE DETECTED  NONE DETECTED Final   Phencyclidine (PCP) Ur S 08/29/2023 NONE DETECTED  NONE DETECTED Final   Cannabinoid 50 Ng, Ur [place] 08/29/2023 NONE DETECTED  NONE DETECTED Final   Barbiturates, Ur Screen 08/29/2023 NONE DETECTED  NONE DETECTED Final   Benzodiazepine, Ur Scrn 08/29/2023 NONE DETECTED  NONE DETECTED Final   Methadone Scn, Ur 08/29/2023 NONE DETECTED  NONE DETECTED Final   Comment: (NOTE) Tricyclics + metabolites, urine    Cutoff 1000 ng/mL Amphetamines + metabolites, urine  Cutoff 1000 ng/mL MDMA (Ecstasy), urine              Cutoff 500 ng/mL Cocaine Metabolite, urine          Cutoff 300 ng/mL Opiate + metabolites, urine        Cutoff 300 ng/mL Phencyclidine (PCP), urine         Cutoff 25 ng/mL Cannabinoid, urine                 Cutoff 50 ng/mL Barbiturates + metabolites, urine  Cutoff 200 ng/mL Benzodiazepine, urine              Cutoff 200 ng/mL Methadone, urine                   Cutoff 300 ng/mL  The urine drug screen provides only a preliminary, unconfirmed analytical test result and should not be used for non-medical purposes. Clinical consideration and professional judgment should be applied to any positive drug screen result due to possible interfering substances. A more specific alternate chemical method must be used in order to obtain a confirmed analytical result. Gas chromatography / mass spectrometry (GC/MS) is the preferred confirm                          atory method. Performed at [facility], [address], [place], [place] [address]     PSYCHIATRIC REVIEW OF SYSTEMS (ROS)  ROS: Notable for the following relevant positive findings: ROS  Additional  findings:      Musculoskeletal: Impaired      Gait & Station: Laying/Sitting      Pain Screening: Denies      Nutrition & Dental Concerns: Decrease in food intake and/or loss of appetite  RISK FORMULATION/ASSESSMENT  Is the patient experiencing any suicidal or homicidal ideations: No       Explain if yes:  Protective factors considered for safety management: access to appropriate clinical intervention  Risk factors/concerns considered for safety management:  Depression Impulsivity Male gender Unmarried  Is there a safety management plan with the patient and treatment team to minimize risk factors and promote protective factors: Yes           Explain: outpatient services Is crisis care placement or psychiatric hospitalization recommended: No     Based on my current evaluation and risk assessment, patient is  determined at this time to be at:  Moderate Risk  *RISK ASSESSMENT Risk assessment is a dynamic process; it is possible that this patient's condition, and risk level, may change. This should be re-evaluated and managed over time as appropriate. Please re-consult psychiatric consult services if additional assistance is needed in terms of risk assessment and management. If your team decides to discharge this patient, please advise the patient how to best access emergency psychiatric services, or to call 911, if their condition worsens or they feel unsafe in any way.   Norval Morton, NP Telepsychiatry Consult Services

## 2023-08-29 NOTE — ED Notes (Signed)
 Lab at the bedside for blood draw

## 2023-08-29 NOTE — ED Notes (Signed)
 Patient to CT via wheelchair.

## 2023-08-29 NOTE — ED Notes (Signed)
 Pt asked for snack of chocolate ice cream; Pt tolerated well; Waste disposed of properly.

## 2023-08-29 NOTE — ED Triage Notes (Signed)
 Pt presents w/ [employer] d/t running away from Group Home and refusing treatment.  Pt will answer limited questions.  Bruising and laceration noted to L eye.  Pt reports he hit his face when he fell in the road.  Sts he was trying to cross the street and fell.   Denies pain.   Per IVC paperwork,  Pt has been dementia and schizophrenia.  He has started wandering away from the Group Home and fell in the roadway.  Pt has started refusing treatments.  He has become aggressive w/ staff.  He has been yelling and cussing and telling them that he was going to hit them.  He has been having delusions, hallucinations, and erratic behavior.   Pt noted to be calm and cooperative w/ PD and ED staff.

## 2023-08-29 NOTE — ED Notes (Signed)
 This tech dressed patient out; Belongings included:  1 pair gray shoes 1 pair blue hospital socks 1 pair dark gray sweat pants 1 dark blue tshirt 1 black puffy coat 1 brown fuzzy hat

## 2023-08-29 NOTE — ED Notes (Signed)
 IVC PENDING  CONSULT ?

## 2023-08-29 NOTE — ED Notes (Signed)
 Pt asked could he have something to eat. This tech gave pt [place] sandwich tray and sprite to drink. Pt tolerated well; waste disposed of properly.

## 2023-08-29 NOTE — ED Notes (Signed)
 Patient ambulatory to the bathroom for specimen and back to bed.

## 2023-08-29 NOTE — ED Notes (Signed)
 Social Services staff at the bedside asking for details as to why the patient was brought to the ER. Update given best to the this nurses gatherings. Patient is able to answer questions and speaking with SS staff.

## 2023-08-29 NOTE — ED Notes (Signed)
Patient in no distress at this time.  Will continue to monitor.

## 2023-08-29 NOTE — ED Provider Triage Note (Signed)
 Emergency Medicine Provider Triage Evaluation Note  Derrick Atkins , a 53 y.o. male  was evaluated in triage.  Pt in police custody and here for IVC. He ran away from his group home and fell in the road. He hit his face and now has bruising and a small laceration to the left eyebrow. He is under IVC for reported agression to staff, yelling and cursing with threats to hit them.  Physical Exam  BP 120/69 (BP Location: Right Arm)   Pulse 74   Temp 97.9 F (36.6 C) (Oral)   Resp 16   Ht 6\' 4"  (1.93 m)   Wt 78.9 kg   SpO2 100%   BMI 21.18 kg/m  Gen:   Awake, no distress   Resp:  Normal effort  MSK:   Moves extremities without difficulty  Other:    Medical Decision Making  Medically screening exam initiated at 3:13 PM.  Appropriate orders placed.  [name] was informed that the remainder of the evaluation will be completed by another provider, this initial triage assessment does not replace that evaluation, and the importance of remaining in the ED until their evaluation is complete.  CT imaging ordered in addition to behavioral health protocols.   Chinita Pester, FNP 08/29/23 1520

## 2023-08-29 NOTE — BH Assessment (Signed)
 Comprehensive Clinical Assessment (CCA) Note  08/29/2023 Derrick Atkins [id number]  Chief Complaint: Patient is a 53 year old male presenting to [facility] ED under IVC. Per triage note Pt presents w/ [employer] d/t running away from Group Home and refusing treatment.  Pt will answer limited questions.  Bruising and laceration noted to L eye.  Pt reports he hit his face when he fell in the road.  Sts he was trying to cross the street and fell.  Per IVC paperwork,  Pt has dementia and schizophrenia.  He has started wandering away from the Group Home and fell in the roadway.  Pt has started refusing treatments.  He has become aggressive w/ staff.  He has been yelling and cussing and telling them that he was going to hit them.  He has been having delusions, hallucinations, and erratic behavior. During assessment patient appears alert and oriented x4, calm and cooperative. Patient recently presented to this ED just 2 days on 08/27/23 due to running way from his group home, he had reported then that he has been bored in his group home and used to play video games but can't anymore because his computer broke. Patient is currently presenting with the same issues and reports "I ran away because I want to run away." However today patient reports that he feels like the nurses at the group home "they talk about me." It was reported that the patient is refusing his treatments but patient reports that he is taking his medications. Patient denies SI/HI/AH/VH Chief Complaint  Patient presents with   Aggressive Behavior   IVC   Visit Diagnosis: Schizophrenia    CCA Screening, Triage and Referral (STR)  Patient Reported Information How did you hear about [place]? Legal System  Referral name: No data recorded Referral phone number: No data recorded  Whom do you see for routine medical problems? No data recorded Practice/Facility Name: No data recorded Practice/Facility Phone Number: No data recorded Name of Contact: No data  recorded Contact Number: No data recorded Contact Fax Number: No data recorded Prescriber Name: No data recorded Prescriber Address (if known): No data recorded  What Is the Reason for Your Visit/Call Today? Pt presents w/ [employer] d/t running away from Group Home and refusing treatment.  Pt will answer limited questions.  Bruising and laceration noted to L eye.  Pt reports he hit his face when he fell in the road.  Sts he was trying to cross the street and fell.   Denies pain.      Per IVC paperwork,  Pt has been dementia and schizophrenia.  He has started wandering away from the Group Home and fell in the roadway.  Pt has started refusing treatments.  He has become aggressive w/ staff.  He has been yelling and cussing and telling them that he was going to hit them.  He has been having delusions, hallucinations, and erratic behavior.      Pt noted to be calm and cooperative w/ PD and ED staff  How Long Has This Been Causing You Problems? > than 6 months  What Do You Feel Would Help You the Most Today? No data recorded  Have You Recently Been in Any Inpatient Treatment (Hospital/Detox/Crisis Center/28-Day Program)? No data recorded Name/Location of Program/Hospital:No data recorded How Long Were You There? No data recorded When Were You Discharged? No data recorded  Have You Ever Received Services From [facility] Before? No data recorded Who Do You See at [facility]? No data  recorded  Have You Recently Had Any Thoughts About Hurting Yourself? No  Are You Planning to Commit Suicide/Harm Yourself At This time? No   Have you Recently Had Thoughts About Hurting Someone [name]? No  Explanation: No data recorded  Have You Used Any Alcohol or Drugs in the Past 24 Hours? No  How Long Ago Did You Use Drugs or Alcohol? No data recorded What Did You Use and How Much? No data recorded  Do You Currently Have a Therapist/Psychiatrist? Yes  Name of Therapist/Psychiatrist: No data recorded  Have You  Been Recently Discharged From Any Office Practice or Programs? No  Explanation of Discharge From Practice/Program: No data recorded    CCA Screening Triage Referral Assessment Type of Contact: Face-to-Face  Is this Initial or Reassessment? No data recorded Date Telepsych consult ordered in [facility]:  No data recorded Time Telepsych consult ordered in [facility]:  No data recorded  Patient Reported Information Reviewed? No data recorded Patient Left Without Being Seen? No data recorded Reason for Not Completing Assessment: No data recorded  Collateral Involvement: No data recorded  Does Patient Have a Court Appointed Legal Guardian? No data recorded Name and Contact of Legal Guardian: No data recorded If Minor and Not Living with Parent(s), Who has Custody? No data recorded Is CPS involved or ever been involved? Never  Is APS involved or ever been involved? Never   Patient Determined To Be At Risk for Harm To Self or Others Based on Review of Patient Reported Information or Presenting Complaint? No  Method: No data recorded Availability of Means: No data recorded Intent: No data recorded Notification Required: No data recorded Additional Information for Danger to Others Potential: No data recorded Additional Comments for Danger to Others Potential: No data recorded Are There Guns or Other Weapons in Your Home? No  Types of Guns/Weapons: No data recorded Are These Weapons Safely Secured?                            No data recorded Who Could Verify You Are Able To Have These Secured: No data recorded Do You Have any Outstanding Charges, Pending Court Dates, Parole/Probation? No data recorded Contacted To Inform of Risk of Harm To Self or Others: No data recorded  Location of Assessment: [facility] ED   Does Patient Present under Involuntary Commitment? Yes  IVC Papers Initial File Date: No data recorded  [place] of Residence: [place]   Patient Currently Receiving the Following  Services: Medication Management; Group Home   Determination of Need: Emergent (2 hours)   Options For Referral: No data recorded    CCA Biopsychosocial Intake/Chief Complaint:  No data recorded Current Symptoms/Problems: No data recorded  Patient Reported Schizophrenia/Schizoaffective Diagnosis in Past: Yes   Strengths: Patient is able to communicate his needs  Preferences: No data recorded Abilities: No data recorded  Type of Services Patient Feels are Needed: No data recorded  Initial Clinical Notes/Concerns: No data recorded  Mental Health Symptoms Depression:  Change in energy/activity; Fatigue   Duration of Depressive symptoms: Greater than two weeks   Mania:  None   Anxiety:   None   Psychosis:  Affective flattening/alogia/avolition   Duration of Psychotic symptoms: Greater than six months   Trauma:  None   Obsessions:  None   Compulsions:  Poor Insight; Repeated behaviors/mental acts   Inattention:  None   Hyperactivity/Impulsivity:  None   Oppositional/Defiant Behaviors:  None   Emotional Irregularity:  None   Other Mood/Personality Symptoms:  No data recorded   Mental Status Exam Appearance and self-care  Stature:  Tall   Weight:  Average weight   Clothing:  Casual   Grooming:  Normal   Cosmetic use:  None   Posture/gait:  Normal   Motor activity:  Not Remarkable   Sensorium  Attention:  Normal   Concentration:  Normal   Orientation:  X5   Recall/memory:  Normal   Affect and Mood  Affect:  Appropriate   Mood:  Depressed   Relating  Eye contact:  Normal   Facial expression:  Responsive   Attitude toward examiner:  Cooperative   Thought and Language  Speech flow: Clear and Coherent   Thought content:  Appropriate to Mood and Circumstances   Preoccupation:  None   Hallucinations:  None   Organization:  No data recorded  [employer] of Knowledge:  Fair   Intelligence:  Average   Abstraction:   Functional   Judgement:  Poor   Reality Testing:  Adequate   Insight:  Lacking; Poor   Decision Making:  Impulsive   Social Functioning  Social Maturity:  Impulsive   Social Judgement:  Heedless   Stress  Stressors:  Housing   Coping Ability:  Exhausted   Skill Deficits:  None   Supports:  Friends/Service system     Religion: Religion/Spirituality Are You A Religious Person?: No  Leisure/Recreation: Leisure / Recreation Do You Have Hobbies?: No  Exercise/Diet: Exercise/Diet Do You Exercise?: No Have You Gained or Lost A Significant Amount of Weight in the Past Six Months?: No Do You Follow a Special Diet?: No Do You Have Any Trouble Sleeping?: No   CCA Employment/Education Employment/Work Situation: Employment / Work Systems developer: On disability Why is Patient on Disability: Mental Health How Long has Patient Been on Disability: Unknown Patient's Job has Been Impacted by Current Illness: No Has Patient ever Been in the [employer]?: No  Education: Education Did You Have An Individualized Education Program (IIEP): No Did You Have Any Difficulty At [employer]?: No Patient's Education Has Been Impacted by Current Illness: No   CCA Family/Childhood History Family and Relationship History: Family history Marital status: Single Does patient have children?: No  Childhood History:  Childhood History Did patient suffer any verbal/emotional/physical/sexual abuse as a child?: No Did patient suffer from severe childhood neglect?: No Has patient ever been sexually abused/assaulted/raped as an adolescent or adult?: No Was the patient ever a victim of a crime or a disaster?: No Witnessed domestic violence?: No Has patient been affected by domestic violence as an adult?: No  Child/Adolescent Assessment:     CCA Substance Use Alcohol/Drug Use: Alcohol / Drug Use Pain Medications: see mar Prescriptions: see mar Over the Counter: see  mar History of alcohol / drug use?: No history of alcohol / drug abuse                         ASAM's:  Six Dimensions of Multidimensional Assessment  Dimension 1:  Acute Intoxication and/or Withdrawal Potential:      Dimension 2:  Biomedical Conditions and Complications:      Dimension 3:  Emotional, Behavioral, or Cognitive Conditions and Complications:     Dimension 4:  Readiness to Change:     Dimension 5:  Relapse, Continued use, or Continued Problem Potential:     Dimension 6:  Recovery/Living Environment:     ASAM Severity Score:  ASAM Recommended Level of Treatment:     Substance use Disorder (SUD)    Recommendations for Services/Supports/Treatments:    DSM5 Diagnoses: Patient Active Problem List   Diagnosis Date Noted   Intractable nausea and vomiting 05/25/2023   Leukopenia 05/25/2023   Right sided weakness 05/25/2023   Acute encephalopathy 05/25/2023   Altered mental status 04/12/2023   Leg swelling 04/12/2023   Ambulatory dysfunction 02/26/2023   DM2 (diabetes mellitus, type 2) (HCC) 02/26/2023   Hyperlipidemia 02/26/2023   Prolonged QT interval 02/26/2023   Normocytic anemia 02/26/2023   Thrombocytopenia (HCC) 02/21/2023   Rhabdomyolysis 02/21/2023   Hypoglycemia 02/21/2023   Abnormal LFTs 02/21/2023   Fall at home, initial encounter 02/21/2023   Cellulitis of left lower extremity 01/06/2023   Iron deficiency anemia 01/06/2023   Cellulitis and abscess of left lower extremity 01/06/2023   Nausea & vomiting 01/06/2023   AKI (acute kidney injury) (HCC) 12/13/2022   Dyslipidemia 12/13/2022   Schizophrenia (HCC) 03/23/2022   Aggressive behavior    Schizophrenia, paranoid, chronic (HCC)    Hypothyroidism 12/25/2013   Essential hypertension 05/03/2007   Type 2 diabetes mellitus without complications (HCC) 05/03/2007    Patient Centered Plan: Patient is on the following Treatment Plan(s):  Impulse Control   Referrals to Alternative  Service(s): Referred to Alternative Service(s):   Place:   Date:   Time:    Referred to Alternative Service(s):   Place:   Date:   Time:    Referred to Alternative Service(s):   Place:   Date:   Time:    Referred to Alternative Service(s):   Place:   Date:   Time:      @[facility]COLLABOFCARE @  [employer], LCAS-A

## 2023-08-30 DIAGNOSIS — F200 Paranoid schizophrenia: Secondary | ICD-10-CM | POA: Diagnosis not present

## 2023-08-30 LAB — URINALYSIS, ROUTINE W REFLEX MICROSCOPIC
Bilirubin Urine: NEGATIVE
Glucose, UA: NEGATIVE mg/dL
Hgb urine dipstick: NEGATIVE
Ketones, ur: NEGATIVE mg/dL
Leukocytes,Ua: NEGATIVE
Nitrite: NEGATIVE
Protein, ur: NEGATIVE mg/dL
Specific Gravity, Urine: 1.01 (ref 1.005–1.030)
pH: 5 (ref 5.0–8.0)

## 2023-08-30 NOTE — ED Notes (Signed)
 Shon Baton, on call for DSS notified of the patient being discharged.

## 2023-08-30 NOTE — ED Notes (Signed)
 Called Lawanda Sales promotion account executive of group home), left voicemail for return call.

## 2023-08-30 NOTE — ED Notes (Signed)
 Dinner tray provided for pt

## 2023-08-30 NOTE — ED Notes (Signed)
VOL  PENDING D/C 

## 2023-08-30 NOTE — ED Notes (Signed)
 On call social worker to be contacted via communications regarding DC.

## 2023-08-30 NOTE — ED Provider Notes (Signed)
-----------------------------------------   1:50 AM on 08/30/2023 ----------------------------------------- Patient evaluated by psychiatry and cleared for discharge home.  On my assessment, he is calm and cooperative and denies any suicidal or homicidal ideation.  IVC was rescinded and patient agreeable to discharge back to his group home.   Chesley Noon, MD 08/30/23 [phone]

## 2023-08-30 NOTE — ED Notes (Signed)
 Called [name] (legal guardian), left voicemail to return call.

## 2023-08-30 NOTE — ED Notes (Signed)
 IVC PAPERS  RESCINDED  PER D R  JESSUP  MD

## 2023-08-30 NOTE — TOC Progression Note (Signed)
 Transition of Care [facility]) - Progression Note    Patient Details  Name: Derrick Atkins MRN: [id number] Date of Birth: Nov 12, 1970  Transition of Care [facility]) CM/SW Contact  Erin Sons, [place] Phone Number: 08/30/2023, 1:59 PM  Clinical Narrative:     CSW called [name] (Legal Guardian Supervisor - [phone]). She states that they have not spoken to any providers from the hospital. She expresses concern about pt being safe to DC back to group home.  She is requesting to speak with medical provider as well as psych provider. She is interested in pt receiving urinalysis to rule out UTI. CSW notified RN; she will try to get an MD to sign into pt.                        Social Determinants of Health (SDOH) Interventions SDOH Screenings   Food Insecurity: No Food Insecurity (05/25/2023)  Housing: Low Risk  (05/25/2023)  Transportation Needs: No Transportation Needs (05/25/2023)  Recent Concern: Transportation Needs - Unmet Transportation Needs (04/14/2023)  Utilities: Not At Risk (05/25/2023)  Alcohol Screen: Low Risk  (03/23/2022)  Tobacco Use: Low Risk  (08/29/2023)    Readmission Risk Interventions    04/15/2023   11:03 AM  Readmission Risk Prevention Plan  Transportation Screening Complete  HRI or Home Care Consult Complete  Social Work Consult for Recovery Care Planning/Counseling Complete  Medication Review Oceanographer) Complete

## 2023-08-30 NOTE — ED Notes (Signed)
 Patient received hospital lunch tray.

## 2023-08-30 NOTE — ED Notes (Signed)
 PT  NOW  VOL

## 2023-08-30 NOTE — ED Notes (Signed)
 Urine sent to lab

## 2023-08-30 NOTE — ED Notes (Signed)
 Called [facility] group home, notified them that we were trying to get in touch with Lawanda and to please have her call us back. Lawanda's number was verified as correct in the chart.

## 2023-08-30 NOTE — ED Notes (Addendum)
 Spoke to [employer], Interior and spatial designer of the patients group home and she stated she does not feel comfortable taking the patient back at this time until she speaks with [name], legal guardian. Charge, RN Erie Noe made aware and to follow back up with the on call DSS [place]. [place].

## 2023-08-30 NOTE — ED Notes (Signed)
 Patient was recommended to be discharged back to group home this morning.  According to notes, Luz Lex, RN spoke to Group Home Director, Jimmye Norman, who shared that she did not feel comfortable taking the patient back at group home and wanted to speak with Legal Guardian first.  [facility] followed up with Rowan Blase [phone]) later this morning. A call was made to [name] (Legal Guardian Supervisor - [phone]), however unable to reach.  Rowan Blase shared she spoke to [name] earlier and they both feel that the patient need to be re-evaluated for inpatient treatment and is too dangerous to be discharged back to group home.  They are hoping to speak to a Provider to share their concerns.  [facility] shared this info in a secure chat with Annice Pih, NP and Christiane Ha, NP.  Christiane Ha noted that the patient was psych cleared by I[facility] with thorough notes and advised he would put in a TOC order if they were unwilling to accept him back.   Graylon Good, [facility]

## 2023-08-31 NOTE — ED Notes (Signed)
 Moved to BHU 2/ VOL pending D/C

## 2023-08-31 NOTE — ED Notes (Signed)
 Pt discharging back into care of DSS assigned SW [name] [phone] to be transported back to [facility]  Pt denies all SI/HI and stated he has no current A/V hallucinations.  All belonging accounted for and returned back to Mr [place]coat.  Pt left ambulatory via POV.

## 2023-08-31 NOTE — ED Provider Notes (Addendum)
 Emergency Medicine Observation Re-evaluation Note  Derrick Atkins is a 53 y.o. male, seen on rounds today.  Pt initially presented to the ED for complaints of Aggressive Behavior and IVC  Currently, the patient is resting in bed. No reported issues from nursing team.   Physical Exam  BP (!) 145/56 (BP Location: Right Arm)   Pulse (!) 106   Temp 98.5 F (36.9 C) (Oral)   Resp 18   Ht 6\' 4"  (1.93 m)   Wt 78.9 kg   SpO2 98%   BMI 21.18 kg/m  Physical Exam General: Resting in bed  ED Course / MDM   Urinalysis performed yesterday without evidence of infection, no other labs last 24 hours  Plan  Current plan is for dispo per social work .    Trinna Post, MD 08/31/23 [phone]  8:53 AM Notified by RN that patient had been ready for discharge, was awaiting transportation which is now on the way.  Patient discharged in stable condition.     Trinna Post, MD 08/31/23 [phone]

## 2023-08-31 NOTE — ED Notes (Signed)
 Spoke to [name] (Legal Guardian Supervisor [phone]) she is sending a SW to p/u Mr Skog to transport back to his GH this A.M.  will be here by 10a.m.

## 2023-08-31 NOTE — ED Notes (Signed)
 Hospital meal provided.  100% consumed, pt tolerated w/o complaints.  Waste discarded appropriately.

## 2023-08-31 NOTE — ED Notes (Signed)
 Call [facility] non-emergency communications number @ [phone], had the emergency on-call c/b due to incoming inclement weather, and that this patient as been ready for discharge.  The ED staff has spoken with several persons at [facility] co in reference to this patient.  Additional medical test had been ran at their request w/ all results being WNL having no indication that this patient has a need for continued stay.  [facility] staff now awaiting return call from supervisor Toniann Fail to arrange transportation back to group home.

## 2023-10-26 ENCOUNTER — Emergency Department
Admission: EM | Admit: 2023-10-26 | Discharge: 2023-10-26 | Disposition: A | Discharge status: Home / Self Care | Attending: Emergency Medicine | Admitting: Emergency Medicine

## 2023-10-26 ENCOUNTER — Other Ambulatory Visit: Payer: Self-pay

## 2023-10-26 DIAGNOSIS — R45851 Suicidal ideations: Secondary | ICD-10-CM | POA: Diagnosis not present

## 2023-10-26 DIAGNOSIS — Z7982 Long term (current) use of aspirin: Secondary | ICD-10-CM | POA: Diagnosis not present

## 2023-10-26 DIAGNOSIS — G809 Cerebral palsy, unspecified: Secondary | ICD-10-CM | POA: Diagnosis not present

## 2023-10-26 DIAGNOSIS — F258 Other schizoaffective disorders: Secondary | ICD-10-CM | POA: Diagnosis not present

## 2023-10-26 DIAGNOSIS — F259 Schizoaffective disorder, unspecified: Secondary | ICD-10-CM | POA: Diagnosis not present

## 2023-10-26 DIAGNOSIS — I10 Essential (primary) hypertension: Secondary | ICD-10-CM | POA: Diagnosis not present

## 2023-10-26 DIAGNOSIS — F4323 Adjustment disorder with mixed anxiety and depressed mood: Secondary | ICD-10-CM | POA: Diagnosis not present

## 2023-10-26 DIAGNOSIS — R4689 Other symptoms and signs involving appearance and behavior: Secondary | ICD-10-CM | POA: Diagnosis present

## 2023-10-26 DIAGNOSIS — Z79899 Other long term (current) drug therapy: Secondary | ICD-10-CM | POA: Insufficient documentation

## 2023-10-26 DIAGNOSIS — F4322 Adjustment disorder with anxiety: Secondary | ICD-10-CM | POA: Insufficient documentation

## 2023-10-26 DIAGNOSIS — E119 Type 2 diabetes mellitus without complications: Secondary | ICD-10-CM | POA: Diagnosis not present

## 2023-10-26 LAB — COMPREHENSIVE METABOLIC PANEL WITH GFR
ALT: 15 U/L (ref 0–44)
AST: 22 U/L (ref 15–41)
Albumin: 4.2 g/dL (ref 3.5–5.0)
Alkaline Phosphatase: 73 U/L (ref 38–126)
Anion gap: 8 (ref 5–15)
BUN: 13 mg/dL (ref 6–20)
CO2: 21 mmol/L — ABNORMAL LOW (ref 22–32)
Calcium: 9.7 mg/dL (ref 8.9–10.3)
Chloride: 112 mmol/L — ABNORMAL HIGH (ref 98–111)
Creatinine, Ser: 0.99 mg/dL (ref 0.61–1.24)
GFR, Estimated: >60 mL/min (ref >60)
Glucose, Bld: 82 mg/dL (ref 70–99)
Potassium: 3.9 mmol/L (ref 3.5–5.1)
Sodium: 141 mmol/L (ref 135–145)
Total Bilirubin: 0.7 mg/dL (ref 0.0–1.2)
Total Protein: 6.7 g/dL (ref 6.5–8.1)

## 2023-10-26 LAB — URINE DRUG SCREEN, QUALITATIVE (ARMC ONLY)
Amphetamines, Ur Screen: NOT DETECTED
Barbiturates, Ur Screen: NOT DETECTED
Benzodiazepine, Ur Scrn: NOT DETECTED
Cannabinoid 50 Ng, Ur ~~LOC~~: NOT DETECTED
Cocaine Metabolite,Ur ~~LOC~~: NOT DETECTED
MDMA (Ecstasy)Ur Screen: NOT DETECTED
Methadone Scn, Ur: NOT DETECTED
Opiate, Ur Screen: NOT DETECTED
Phencyclidine (PCP) Ur S: NOT DETECTED
Tricyclic, Ur Screen: NOT DETECTED

## 2023-10-26 LAB — CBC
HCT: 33 % — ABNORMAL LOW (ref 39.0–52.0)
Hemoglobin: 11.1 g/dL — ABNORMAL LOW (ref 13.0–17.0)
MCH: 29.2 pg (ref 26.0–34.0)
MCHC: 33.6 g/dL (ref 30.0–36.0)
MCV: 86.8 fL (ref 80.0–100.0)
Platelets: 163 10*3/uL (ref 150–400)
RBC: 3.8 MIL/uL — ABNORMAL LOW (ref 4.22–5.81)
RDW: 15.3 % (ref 11.5–15.5)
WBC: 7.1 10*3/uL (ref 4.0–10.5)
nRBC: 0 % (ref 0.0–0.2)

## 2023-10-26 LAB — SALICYLATE LEVEL: Salicylate Lvl: <7 mg/dL — ABNORMAL LOW (ref 7.0–30.0)

## 2023-10-26 LAB — ETHANOL: Alcohol, Ethyl (B): <10 mg/dL (ref <10)

## 2023-10-26 LAB — ACETAMINOPHEN LEVEL: Acetaminophen (Tylenol), Serum: <10 ug/mL — ABNORMAL LOW (ref 10–30)

## 2023-10-26 MED ORDER — OLANZAPINE 5 MG PO TABS: ORAL_TABLET | ORAL | 2 refills | Status: DC

## 2023-10-26 NOTE — Discharge Instructions (Addendum)
 Take 25 mg of olanzapine nightly as discussed with the psychiatrist.  Follow-up with your primary doctor

## 2023-10-26 NOTE — Consult Note (Signed)
 [facility] Consult Note  Patient Name: Derrick Atkins MRN: [id number] DOB: 08-26-1970 DATE OF Consult: 10/26/2023  PRIMARY PSYCHIATRIC DIAGNOSES  1.  Adjustment disorder with anxiety 2.  Schizoaffective disorder 3.  Cerebral palsy  RECOMMENDATIONS  Recommendations:  Medication recommendations: consider titrating olanzapine to 25 mg (from 20 mg) po nightly for mood, anxiety, schizoaffective d/o  Non-Medication/therapeutic recommendations: continue GH placement  Is inpatient psychiatric hospitalization recommended for this patient? No (Explain why): no immediate danger to self or others  Follow-Up Telepsychiatry C/L services: We will sign off for now. Please re-consult our service if needed for any concerning changes in the patient's condition, discharge planning, or questions.  Communication: Treatment team members (and family members if applicable) who were involved in treatment/care discussions and planning, and with whom we spoke or engaged with via secure text/chat, include the following: EDMD, EDRN  Thank you for involving [place] in the care of this patient. If you have any additional questions or concerns, please call [phone] and ask for me or the provider on-call.  TELEPSYCHIATRY ATTESTATION & CONSENT  As the provider for this telehealth consult, I attest that I verified the patient's identity using two separate identifiers, introduced myself to the patient, provided my credentials, disclosed my location, and performed this encounter via a HIPAA-compliant, real-time, face-to-face, two-way, interactive audio and video platform and with the full consent and agreement of the patient (or guardian as applicable.)  Patient physical location: ED at [facility]. Telehealth provider physical location: home office in state of [place].  Video start time: 428a CST (Central Time) Video end time: 441 a CST (Central Time)  IDENTIFYING DATA  Derrick Atkins is a 53 y.o.  year-old male for whom a psychiatric consultation has been ordered by the primary provider. The patient was identified using two separate identifiers.  CHIEF COMPLAINT/REASON FOR CONSULT   Suicidal ideation; schizoaffective disorder   HISTORY OF PRESENT ILLNESS (HPI)  The patient is a 53 year old man with history of cerebral palsy, schizoaffective disorder; residing at group home; brought to ED with c/o now-resolved suicidal ideation in context of frustrations with current placement. He has presented before with similar circumstances however compared to then, there is no current reported history of aggressive behavior.  Patient when seen reports he feels much better after having slept. He reports feeling nervous now but intensely nervous earlier related to stressors pertaining to his guardian and he planning to look into other group home placement options. He reports last Psych admission was ~19-20 years ago. He reports olanzapine has been helpful though he reports feeling thirsty often. I discussed titrating from 20 to 25 mg as he endorses ongoing AVH (non-command) and for his anxiety. He was amenable.  Patient reports improving and he denies SI, HI. I do not believe he's an immediate danger to himself or others and does not currently require Psychiatric admission.     PAST PSYCHIATRIC HISTORY  Psych admissions: Yes, last approximately two decades ago Suicide attempts: No Aggression: Reported prior history of violent behavior by chart history Prior med trials; Yes, on olanzapine now   Otherwise as per HPI above.  PAST MEDICAL HISTORY  Past Medical History:  Diagnosis Date   Cerebral palsy (HCC)    Depression    Diabetes mellitus without complication (HCC)    Hypertension    Kidney stones    Schizoaffective disorder (HCC)    Scoliosis      HOME MEDICATIONS  PTA Medications  Medication Sig   aspirin EC  81 MG tablet Take 1 tablet (81 mg total) by mouth daily. Swallow whole.    OLANZapine (ZYPREXA) 20 MG tablet Take 1 tablet (20 mg total) by mouth at bedtime. (Patient taking differently: Take 20 mg by mouth at bedtime. (Take with 10mg  tablet to equal 30mg  total))   levothyroxine (SYNTHROID) 88 MCG tablet Take 1 tablet (88 mcg total) by mouth daily at 6 (six) AM.   benztropine (COGENTIN) 1 MG tablet Take 1 tablet (1 mg total) by mouth daily.   ABILIFY MAINTENA 400 MG PRSY prefilled syringe Inject 400 mg into the muscle every 28 (twenty-eight) days. Due 11/15-11/20   polyethylene glycol (MIRALAX / GLYCOLAX) 17 g packet Take 17 g by mouth daily.   cholecalciferol (VITAMIN D3) 25 MCG (1000 UNIT) tablet Take 1,000 Units by mouth daily.   OLANZapine (ZYPREXA) 10 MG tablet Take 10 mg by mouth at bedtime. (Take with 20mg  tablet to equal 30mg  total)   folic acid (FOLVITE) 1 MG tablet Take 1 tablet (1 mg total) by mouth daily.   iron polysaccharides (NIFEREX) 150 MG capsule Take 1 capsule (150 mg total) by mouth daily.   ascorbic acid (VITAMIN C) 500 MG tablet Take 1 tablet (500 mg total) by mouth daily.   senna (SENOKOT) 8.6 MG TABS tablet Take 2 tablets by mouth at bedtime.   acetaminophen (TYLENOL) 500 MG tablet Take 1,000 mg by mouth 3 (three) times daily as needed for mild pain or moderate pain.   furosemide (LASIX) 20 MG tablet Take 20 mg by mouth in the morning. (Patient not taking: Reported on 08/29/2023)   fenofibrate 54 MG tablet Take 54 mg by mouth daily.   rosuvastatin (CRESTOR) 5 MG tablet Take 5 mg by mouth daily.   gemfibrozil (LOPID) 600 MG tablet Take 600 mg by mouth 2 (two) times daily before a meal.   VENTOLIN HFA 108 (90 Base) MCG/ACT inhaler Inhale into the lungs every 4 (four) hours as needed.   hydrOXYzine (ATARAX) 50 MG tablet Take 50 mg by mouth 3 (three) times daily. (Patient not taking: Reported on 08/29/2023)   loperamide (IMODIUM) 2 MG capsule Take 4 mg by mouth 2 (two) times daily.   potassium chloride SA (KLOR-CON M) 20 MEQ tablet Take 20 mEq by mouth  daily.   metoprolol tartrate (LOPRESSOR) 25 MG tablet Take 1 tablet (25 mg total) by mouth 2 (two) times daily.   lamoTRIgine (LAMICTAL) 25 MG tablet Take 25 mg by mouth 2 (two) times daily.   pantoprazole (PROTONIX) 40 MG tablet Take 1 tablet (40 mg total) by mouth daily before breakfast.   Cholecalciferol 25 MCG (1000 UT) TBDP Take 1 tablet by mouth daily.   linaclotide (LINZESS) 145 MCG CAPS capsule Take 145 mcg by mouth daily before breakfast.   hydrOXYzine (ATARAX) 10 MG tablet Take 10 mg by mouth daily. At noon   FLUoxetine (PROZAC) 20 MG capsule Take 20 mg by mouth daily.     ALLERGIES  Allergies  Allergen Reactions   Valproic Acid And Related Other (See Comments)    Hyperammonemic encephalopathy   Phenytoin Sodium Extended Other (See Comments) and Nausea And Vomiting   Prednisone Hives and Nausea And Vomiting   Latex Hives, Nausea And Vomiting and Rash    SOCIAL & SUBSTANCE USE HISTORY  Social History   Socioeconomic History   Marital status: Single    Spouse name: Not on file   Number of children: Not on file   Years of education: Not on file  Highest education level: Not on file  Occupational History   Not on file  Tobacco Use   Smoking status: Never    Passive exposure: Never   Smokeless tobacco: Never  Vaping Use   Vaping status: Never Used  Substance and Sexual Activity   Alcohol use: No   Drug use: No   Sexual activity: Not Currently  Other Topics Concern   Not on file  Social History Narrative   Not on file   Social Drivers of Health   Financial Resource Strain: Not on file  Food Insecurity: No Food Insecurity (05/25/2023)   Hunger Vital Sign    Worried About Running Out of Food in the Last Year: Never true    Ran Out of Food in the Last Year: Never true  Transportation Needs: No Transportation Needs (05/25/2023)   PRAPARE - Administrator, Civil Service (Medical): No    Lack of Transportation (Non-Medical): No  Recent Concern:  Transportation Needs - Unmet Transportation Needs (04/14/2023)   PRAPARE - Administrator, Civil Service (Medical): Yes    Lack of Transportation (Non-Medical): Yes  Physical Activity: Not on file  Stress: Not on file  Social Connections: Not on file   Social History   Tobacco Use  Smoking Status Never   Passive exposure: Never  Smokeless Tobacco Never   Social History   Substance and Sexual Activity  Alcohol Use No   Social History   Substance and Sexual Activity  Drug Use No    Additional pertinent information: Resides at group home Pending search for other placements with guardian.  FAMILY HISTORY  Family History  Problem Relation Age of Onset   Heart disease Father    Heart attack Father    Family Psychiatric History (if known):  denies  MENTAL STATUS EXAM (MSE)  Mental Status Exam: General Appearance: hospital attire  Orientation:  oriented to self, place, context  Memory:  fair  Concentration:  fair  Recall:  no immediately apparent deficits  Attention  fair  Eye Contact:  evasive  Speech:  normal rate, rhythm  Language:  fluent english  Volume:  normal  Mood: "I was just really nervous" - patient  Affect:  constricted  Thought Process:  linear, concrete  Thought Content:  reports AVH, chronic  Suicidal Thoughts:  denies  Homicidal Thoughts:  denies  Judgement:  fair  Insight:  fair  Psychomotor Activity:  no overt PMA/PMR  Akathisia:  none evident  Fund of Knowledge:  fair      VITALS  Blood pressure 129/74, pulse 80, temperature 97.6 F (36.4 C), resp. rate 18, height 6\' 4"  (1.93 m), weight 79.4 kg, SpO2 100%.  LABS  Admission on 10/26/2023  Component Date Value Ref Range Status   Sodium 10/26/2023 141  135 - 145 mmol/L Final   Potassium 10/26/2023 3.9  3.5 - 5.1 mmol/L Final   Chloride 10/26/2023 112 (H)  98 - 111 mmol/L Final   CO2 10/26/2023 21 (L)  22 - 32 mmol/L Final   Glucose, Bld 10/26/2023 82  70 - 99 mg/dL Final    Glucose reference range applies only to samples taken after fasting for at least 8 hours.   BUN 10/26/2023 13  6 - 20 mg/dL Final   Creatinine, Ser 10/26/2023 0.99  0.61 - 1.24 mg/dL Final   Calcium 16/04/9603 9.7  8.9 - 10.3 mg/dL Final   Total Protein 54/03/8118 6.7  6.5 - 8.1 g/dL Final  Albumin 10/26/2023 4.2  3.5 - 5.0 g/dL Final   AST 11/91/4782 22  15 - 41 U/L Final   ALT 10/26/2023 15  0 - 44 U/L Final   Alkaline Phosphatase 10/26/2023 73  38 - 126 U/L Final   Total Bilirubin 10/26/2023 0.7  0.0 - 1.2 mg/dL Final   GFR, Estimated 10/26/2023 >60  >60 mL/min Final   Comment: (NOTE) Calculated using the CKD-EPI Creatinine Equation (2021)    Anion gap 10/26/2023 8  5 - 15 Final   Performed at [facility], [address] [place], [place] [address]   Alcohol, Ethyl (B) 10/26/2023 <10  <10 mg/dL Final   Comment: (NOTE) Lowest detectable limit for serum alcohol is 10 mg/dL.  For medical purposes only. Performed at [facility], [address] [place], [place] [address]    Salicylate Lvl 10/26/2023 <7.0 (L)  7.0 - 30.0 mg/dL Final   Performed at [facility], [address] [place], [place] [address]   Acetaminophen (Tylenol), Serum 10/26/2023 <10 (L)  10 - 30 ug/mL Final   Comment: (NOTE) Therapeutic concentrations vary significantly. A range of 10-30 ug/mL  may be an effective concentration for many patients. However, some  are best treated at concentrations outside of this range. Acetaminophen concentrations >150 ug/mL at 4 hours after ingestion  and >50 ug/mL at 12 hours after ingestion are often associated with  toxic reactions.  Performed at [facility], [address] [place], [place] [address]    WBC 10/26/2023 7.1  4.0 - 10.5 K/uL Final   RBC 10/26/2023 3.80 (L)  4.22 - 5.81 MIL/uL Final   Hemoglobin 10/26/2023 11.1 (L)  13.0 - 17.0 g/dL Final   HCT 84/13/2440 33.0 (L)  39.0 - 52.0 % Final   MCV 10/26/2023 86.8  80.0 -  100.0 fL Final   MCH 10/26/2023 29.2  26.0 - 34.0 pg Final   MCHC 10/26/2023 33.6  30.0 - 36.0 g/dL Final   RDW 05/08/2535 15.3  11.5 - 15.5 % Final   Platelets 10/26/2023 163  150 - 400 K/uL Final   nRBC 10/26/2023 0.0  0.0 - 0.2 % Final   Performed at [facility], [address] [place], [place] [address]   Tricyclic, Ur Screen 10/26/2023 NONE DETECTED  NONE DETECTED Final   Amphetamines, Ur Screen 10/26/2023 NONE DETECTED  NONE DETECTED Final   MDMA (Ecstasy)Ur Screen 10/26/2023 NONE DETECTED  NONE DETECTED Final   Cocaine Metabolite,Ur [place] 10/26/2023 NONE DETECTED  NONE DETECTED Final   Opiate, Ur Screen 10/26/2023 NONE DETECTED  NONE DETECTED Final   Phencyclidine (PCP) Ur S 10/26/2023 NONE DETECTED  NONE DETECTED Final   Cannabinoid 50 Ng, Ur [place] 10/26/2023 NONE DETECTED  NONE DETECTED Final   Barbiturates, Ur Screen 10/26/2023 NONE DETECTED  NONE DETECTED Final   Benzodiazepine, Ur Scrn 10/26/2023 NONE DETECTED  NONE DETECTED Final   Methadone Scn, Ur 10/26/2023 NONE DETECTED  NONE DETECTED Final   Comment: (NOTE) Tricyclics + metabolites, urine    Cutoff 1000 ng/mL Amphetamines + metabolites, urine  Cutoff 1000 ng/mL MDMA (Ecstasy), urine              Cutoff 500 ng/mL Cocaine Metabolite, urine          Cutoff 300 ng/mL Opiate + metabolites, urine        Cutoff 300 ng/mL Phencyclidine (PCP), urine         Cutoff 25 ng/mL Cannabinoid, urine  Cutoff 50 ng/mL Barbiturates + metabolites, urine  Cutoff 200 ng/mL Benzodiazepine, urine              Cutoff 200 ng/mL Methadone, urine                   Cutoff 300 ng/mL  The urine drug screen provides only a preliminary, unconfirmed analytical test result and should not be used for non-medical purposes. Clinical consideration and professional judgment should be applied to any positive drug screen result due to possible interfering substances. A more specific alternate chemical method must be used in order to  obtain a confirmed analytical result. Gas chromatography / mass spectrometry (GC/MS) is the preferred confirm                          atory method. Performed at [facility], [address] [place], [place] [address]     PSYCHIATRIC REVIEW OF SYSTEMS (ROS)  ROS: Notable for the following relevant positive findings: ROS  Additional findings:      Musculoskeletal: No abnormal movements observed      Gait & Station: Laying/Sitting      Pain Screening: Denies   RISK FORMULATION/ASSESSMENT  Is the patient experiencing any suicidal or homicidal ideations: No  Protective factors considered for safety management: future oriented, help-seeking, denial of SI, supervised living setting  Risk factors/concerns considered for safety management:  Depression Male gender  Is there a safety management plan with the patient and treatment team to minimize risk factors and promote protective factors: Yes           Explain: continued group home supervision Is crisis care placement or psychiatric hospitalization recommended: No     Based on my current evaluation and risk assessment, patient is determined at this time to be at:  Low risk  *RISK ASSESSMENT Risk assessment is a dynamic process; it is possible that this patient's condition, and risk level, may change. This should be re-evaluated and managed over time as appropriate. Please re-consult psychiatric consult services if additional assistance is needed in terms of risk assessment and management. If your team decides to discharge this patient, please advise the patient how to best access emergency psychiatric services, or to call 911, if their condition worsens or they feel unsafe in any way.   Alferd Angers, MD Telepsychiatry Consult Services

## 2023-10-26 NOTE — ED Notes (Signed)
 Pt belongings:  Black shoes Grey socks Red shirt Black pants Grey underwear

## 2023-10-26 NOTE — BH Assessment (Signed)
 Comprehensive Clinical Assessment (CCA) Note  10/26/2023 Derrick Atkins [id number]  Chief Complaint: Patient is a 53 year old male presenting to [facility] ED voluntarily. Per triage note Pt states he was dropped off at ER by [employer] that picked him up from [facility], pt states he went to [facility] because people are not treating him fairly at his group home and that is making him suicidal, pt states he resides at [facility] Group home in [place]. Pt calm and cooperative. Pt states he has plan to run in front of a car. During assessment patient appears alert and oriented x4, calm and cooperative. Patient reports "I'm not feeling good." Patient often presents to this ED after running away from his group home, tonight patient reports "I don't like it there, I don't like the person running the group home because they have favorites." When asked if he has expressed this concern with this guardian he reports "yes, we are supposed to go look at some places at the end of the week." Patient reports that he is taking his medications as prescribed and reports having good sleep and a good appetite. Patient currently denies SI/HI/VH, he reports some AH where he reports his voices "are condemning me." Patient not responding to any internal or external stimuli.  Chief Complaint  Patient presents with   Psychiatric Evaluation   Visit Diagnosis: Schizophrenia, paranoid type    CCA Screening, Triage and Referral (STR)  Patient Reported Information How did you hear about [place]? Other (Comment)  Referral name: No data recorded Referral phone number: No data recorded  Whom do you see for routine medical problems? No data recorded Practice/Facility Name: No data recorded Practice/Facility Phone Number: No data recorded Name of Contact: No data recorded Contact Number: No data recorded Contact Fax Number: No data recorded Prescriber Name: No data recorded Prescriber Address (if known): No data recorded  What Is the Reason for Your  Visit/Call Today? Pt states he was dropped off at ER by [employer] that picked him up from [facility], pt states he went to [facility] because people are not treating him fairly at his group home and that is making him suicidal, pt states he resides at [facility] home in [place]. Pt calm and cooperative. Pt states he has plan to run in front of a car.  How Long Has This Been Causing You Problems? > than 6 months  What Do You Feel Would Help You the Most Today? No data recorded  Have You Recently Been in Any Inpatient Treatment (Hospital/Detox/Crisis Center/28-Day Program)? No data recorded Name/Location of Program/Hospital:No data recorded How Long Were You There? No data recorded When Were You Discharged? No data recorded  Have You Ever Received Services From [facility] Before? No data recorded Who Do You See at [facility]? No data recorded  Have You Recently Had Any Thoughts About Hurting Yourself? No  Are You Planning to Commit Suicide/Harm Yourself At This time? No   Have you Recently Had Thoughts About Hurting Someone [name]? No  Explanation: No data recorded  Have You Used Any Alcohol or Drugs in the Past 24 Hours? No  How Long Ago Did You Use Drugs or Alcohol? No data recorded What Did You Use and How Much? No data recorded  Do You Currently Have a Therapist/Psychiatrist? Yes  Name of Therapist/Psychiatrist: No data recorded  Have You Been Recently Discharged From Any Office Practice or Programs? No  Explanation of Discharge From Practice/Program: No data recorded    CCA Screening  Triage Referral Assessment Type of Contact: Face-to-Face  Is this Initial or Reassessment? No data recorded Date Telepsych consult ordered in [facility]:  No data recorded Time Telepsych consult ordered in [facility]:  No data recorded  Patient Reported Information Reviewed? No data recorded Patient Left Without Being Seen? No data recorded Reason for Not Completing Assessment: No data recorded  Collateral  Involvement: No data recorded  Does Patient Have a Court Appointed Legal Guardian? No data recorded Name and Contact of Legal Guardian: No data recorded If Minor and Not Living with Parent(s), Who has Custody? No data recorded Is CPS involved or ever been involved? Never  Is APS involved or ever been involved? Never   Patient Determined To Be At Risk for Harm To Self or Others Based on Review of Patient Reported Information or Presenting Complaint? No  Method: No data recorded Availability of Means: No data recorded Intent: No data recorded Notification Required: No data recorded Additional Information for Danger to Others Potential: No data recorded Additional Comments for Danger to Others Potential: No data recorded Are There Guns or Other Weapons in Your Home? No  Types of Guns/Weapons: No data recorded Are These Weapons Safely Secured?                            No data recorded Who Could Verify You Are Able To Have These Secured: No data recorded Do You Have any Outstanding Charges, Pending Court Dates, Parole/Probation? No data recorded Contacted To Inform of Risk of Harm To Self or Others: No data recorded  Location of Assessment: [facility] ED   Does Patient Present under Involuntary Commitment? No  IVC Papers Initial File Date: No data recorded  [place] of Residence: [place]   Patient Currently Receiving the Following Services: Group Home; Medication Management   Determination of Need: Emergent (2 hours)   Options For Referral: No data recorded    CCA Biopsychosocial Intake/Chief Complaint:  No data recorded Current Symptoms/Problems: No data recorded  Patient Reported Schizophrenia/Schizoaffective Diagnosis in Past: Yes   Strengths: Patient is able to communicate his needs  Preferences: No data recorded Abilities: No data recorded  Type of Services Patient Feels are Needed: No data recorded  Initial Clinical Notes/Concerns: No data recorded  Mental  Health Symptoms Depression:  Change in energy/activity; Fatigue   Duration of Depressive symptoms: Greater than two weeks   Mania:  None   Anxiety:   None   Psychosis:  Affective flattening/alogia/avolition   Duration of Psychotic symptoms: Greater than six months   Trauma:  None   Obsessions:  Poor insight; Recurrent & persistent thoughts/impulses/images   Compulsions:  Poor Insight; Repeated behaviors/mental acts   Inattention:  None   Hyperactivity/Impulsivity:  None   Oppositional/Defiant Behaviors:  None   Emotional Irregularity:  Chronic feelings of emptiness; Recurrent suicidal behaviors/gestures/threats   Other Mood/Personality Symptoms:  No data recorded   Mental Status Exam Appearance and self-care  Stature:  Tall   Weight:  Average weight   Clothing:  Casual   Grooming:  Normal   Cosmetic use:  None   Posture/gait:  Normal   Motor activity:  Not Remarkable   Sensorium  Attention:  Normal   Concentration:  Normal   Orientation:  X5   Recall/memory:  Normal   Affect and Mood  Affect:  Appropriate   Mood:  Depressed   Relating  Eye contact:  Normal   Facial expression:  Responsive  Attitude toward examiner:  Cooperative   Thought and Language  Speech flow: Clear and Coherent   Thought content:  Appropriate to Mood and Circumstances   Preoccupation:  None   Hallucinations:  None   Organization:  No data recorded  [employer] of Knowledge:  Fair   Intelligence:  Average   Abstraction:  Functional   Judgement:  Poor   Reality Testing:  Adequate   Insight:  Lacking; Poor   Decision Making:  Impulsive   Social Functioning  Social Maturity:  Impulsive   Social Judgement:  Heedless   Stress  Stressors:  Housing   Coping Ability:  Exhausted   Skill Deficits:  None   Supports:  Friends/Service system     Religion: Religion/Spirituality Are You A Religious Person?:  No  Leisure/Recreation: Leisure / Recreation Do You Have Hobbies?: No  Exercise/Diet: Exercise/Diet Do You Exercise?: No Have You Gained or Lost A Significant Amount of Weight in the Past Six Months?: No Do You Follow a Special Diet?: No Do You Have Any Trouble Sleeping?: No   CCA Employment/Education Employment/Work Situation: Employment / Work Situation Employment Situation: On disability Why is Patient on Disability: Mental Health How Long has Patient Been on Disability: Unknown Patient's Job has Been Impacted by Current Illness: No Has Patient ever Been in the [employer]?: No  Education: Education Did You Have An Individualized Education Program (IIEP): No Did You Have Any Difficulty At School?: No Patient's Education Has Been Impacted by Current Illness: No   CCA Family/Childhood History Family and Relationship History: Family history Marital status: Single Does patient have children?: No  Childhood History:  Childhood History Did patient suffer any verbal/emotional/physical/sexual abuse as a child?: No Did patient suffer from severe childhood neglect?: No Has patient ever been sexually abused/assaulted/raped as an adolescent or adult?: No Was the patient ever a victim of a crime or a disaster?: No Witnessed domestic violence?: No Has patient been affected by domestic violence as an adult?: No  Child/Adolescent Assessment:     CCA Substance Use Alcohol/Drug Use: Alcohol / Drug Use Pain Medications: see mar Prescriptions: see mar Over the Counter: see mar History of alcohol / drug use?: No history of alcohol / drug abuse                         ASAM's:  Six Dimensions of Multidimensional Assessment  Dimension 1:  Acute Intoxication and/or Withdrawal Potential:      Dimension 2:  Biomedical Conditions and Complications:      Dimension 3:  Emotional, Behavioral, or Cognitive Conditions and Complications:     Dimension 4:  Readiness to  Change:     Dimension 5:  Relapse, Continued use, or Continued Problem Potential:     Dimension 6:  Recovery/Living Environment:     ASAM Severity Score:    ASAM Recommended Level of Treatment:     Substance use Disorder (SUD)    Recommendations for Services/Supports/Treatments:    DSM5 Diagnoses: Patient Active Problem List   Diagnosis Date Noted   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   Intractable nausea and vomiting 05/25/2023   Leukopenia 05/25/2023   Right sided weakness 05/25/2023   Acute encephalopathy 05/25/2023   Altered mental status 04/12/2023   Leg swelling 04/12/2023   Ambulatory dysfunction 02/26/2023   DM2 (diabetes mellitus, type 2) (HCC) 02/26/2023   Hyperlipidemia 02/26/2023   Prolonged QT interval 02/26/2023   Normocytic  anemia 02/26/2023   Thrombocytopenia (HCC) 02/21/2023   Rhabdomyolysis 02/21/2023   Hypoglycemia 02/21/2023   Abnormal LFTs 02/21/2023   Fall at home, initial encounter 02/21/2023   Cellulitis of left lower extremity 01/06/2023   Iron deficiency anemia 01/06/2023   Cellulitis and abscess of left lower extremity 01/06/2023   Nausea & vomiting 01/06/2023   AKI (acute kidney injury) (HCC) 12/13/2022   Dyslipidemia 12/13/2022   Schizophrenia (HCC) 03/23/2022   Aggressive behavior    Schizophrenia, paranoid, chronic (HCC)    Hypothyroidism 12/25/2013   Essential hypertension 05/03/2007   Type 2 diabetes mellitus without complications (HCC) 05/03/2007    Patient Centered Plan: Patient is on the following Treatment Plan(s):  Depression and Impulse Control   Referrals to Alternative Service(s): Referred to Alternative Service(s):   Place:   Date:   Time:    Referred to Alternative Service(s):   Place:   Date:   Time:    Referred to Alternative Service(s):   Place:   Date:   Time:    Referred to Alternative Service(s):   Place:   Date:   Time:      @BHCOLLABOFCARE @  [employer], LCAS-A

## 2023-10-26 NOTE — ED Notes (Signed)
 Patient discharged with ride to group home with [employer] taxi service to return to [address] [place]. Patient's guardian aware and agreeable with plan.

## 2023-10-26 NOTE — ED Triage Notes (Addendum)
 Pt states he was dropped off at ER by [employer] that picked him up from [facility], pt states he went to [facility] because people are not treating him fairly at his group home and that is making him suicidal, pt states he resides at [facility] home in [place]. Pt calm and cooperative. Pt states he has plan to run in front of a car.

## 2023-10-26 NOTE — BH Assessment (Signed)
 This Clinical research associate contacted [facility] to request an assessment for this patient, patient's assessment is currently pending.

## 2023-10-26 NOTE — ED Provider Notes (Signed)
 [facility] Provider Note    Event Date/Time   First MD Initiated Contact with Patient 10/26/23 0033     (approximate)   History   Psychiatric Evaluation   HPI  Derrick Atkins is a 53 y.o. male   Past medical history of cerebral palsy, depression, schizoaffective, diabetes and hypertension who presents with Suicidal ideation.  He is here voluntarily.  He is here from a group home and states that he is feeling like the nurse is not paying enough attention to him.  He has other vague stressors as well.  He denies any ingestions or self-harm attempts.  He states that he has chronic hallucinations including visual and auditory disturbances though he does not state that there are any command hallucinations.  He has a blister on his toe that is healing otherwise he has no other acute medical complaints.  When asked about a old appearing bruise on his left eye he states that this was acquired many weeks ago as he was running away from his group home and fell.  He has no acute injuries.   External Medical Documents Reviewed: Psychiatry documentation from February 2025 documenting chronic schizophrenia      Physical Exam   Triage Vital Signs: ED Triage Vitals  Encounter Vitals Group     BP 10/26/23 0017 129/74     Systolic BP Percentile --      Diastolic BP Percentile --      Pulse Rate 10/26/23 0017 80     Resp 10/26/23 0017 18     Temp 10/26/23 0018 97.6 F (36.4 C)     Temp src --      SpO2 10/26/23 0017 100 %     Weight 10/26/23 0017 175 lb (79.4 kg)     Height 10/26/23 0017 6\' 4"  (1.93 m)     Head Circumference --      Peak Flow --      Pain Score 10/26/23 0016 7     Pain Loc --      Pain Education --      Exclude from Growth Chart --     Most recent vital signs: Vitals:   10/26/23 0017 10/26/23 0018  BP: 129/74   Pulse: 80   Resp: 18   Temp:  97.6 F (36.4 C)  SpO2: 100%     General: Awake, no distress.  CV:  Good  peripheral perfusion.  Resp:  Normal effort.  Abd:  No distention.  Other:  Awake alert cooperative moving all extremities, old appearing bruise underneath the left eye, small healing blister to the medial side of the great toe without surrounding infectious changes, normal vital signs.  Soft nontender abdomen no rigidity or guarding, no muscle rigidity, and clear lungs.   ED Results / Procedures / Treatments   Labs (all labs ordered are listed, but only abnormal results are displayed) Labs Reviewed  COMPREHENSIVE METABOLIC PANEL WITH GFR - Abnormal; Notable for the following components:      Result Value   Chloride 112 (*)    CO2 21 (*)    All other components within normal limits  SALICYLATE LEVEL - Abnormal; Notable for the following components:   Salicylate Lvl <7.0 (*)    All other components within normal limits  ACETAMINOPHEN LEVEL - Abnormal; Notable for the following components:   Acetaminophen (Tylenol), Serum <10 (*)    All other components within normal limits  CBC - Abnormal; Notable for the following components:  RBC 3.80 (*)    Hemoglobin 11.1 (*)    HCT 33.0 (*)    All other components within normal limits  ETHANOL  URINE DRUG SCREEN, QUALITATIVE ([facility] ONLY)     I ordered and reviewed the above labs they are notable for cell counts unremarkable   PROCEDURES:  Critical Care performed: No  Procedures   MEDICATIONS ORDERED IN ED: Medications - No data to display   IMPRESSION / MDM / ASSESSMENT AND PLAN / ED COURSE  I reviewed the triage vital signs and the nursing notes.                                Patient's presentation is most consistent with acute presentation with potential threat to life or bodily function.  Differential diagnosis includes, but is not limited to, suicidal ideation, acute decompensated psychiatric illness   The patient is on the cardiac monitor to evaluate for evidence of arrhythmia and/or significant heart rate  changes.  MDM:    Is a patient with chronic schizophrenia here with suicidal ideation in the setting of frustrations at his group home here voluntarily.  Obtain basic labs toxicologic labs and otherwise he appears well enough for medical clearance and psychiatric evaluation.  He has suicidal thoughts and chronic schizophrenia with chronic audiovisual hallucinations, is cooperative and willing to stay voluntarily at this time so I do not see a need to commit him involuntarily at this time.         FINAL CLINICAL IMPRESSION(S) / ED DIAGNOSES   Final diagnoses:  Suicidal ideation     Rx / DC Orders   ED Discharge Orders     None        Note:  This document was prepared using Dragon voice recognition software and may include unintentional dictation errors.    Buell Carmin, MD 10/26/23 [phone]

## 2023-10-26 NOTE — ED Notes (Signed)
 Writer spoke with [employer] patient guardian with [place] [employer].  Advised that patient is currently here in the ED and currently up for discharge.  Candice states ok for patient to return to group home and will have social work follow up at the group home.

## 2023-10-31 ENCOUNTER — Other Ambulatory Visit

## 2023-11-02 ENCOUNTER — Other Ambulatory Visit

## 2023-11-03 ENCOUNTER — Emergency Department
Admission: EM | Admit: 2023-11-03 | Discharge: 2023-11-03 | Disposition: A | Discharge status: Skilled Nursing Facility | Attending: Emergency Medicine | Admitting: Emergency Medicine

## 2023-11-03 ENCOUNTER — Other Ambulatory Visit: Payer: Self-pay

## 2023-11-03 DIAGNOSIS — Z79899 Other long term (current) drug therapy: Secondary | ICD-10-CM | POA: Diagnosis not present

## 2023-11-03 DIAGNOSIS — I10 Essential (primary) hypertension: Secondary | ICD-10-CM | POA: Diagnosis not present

## 2023-11-03 DIAGNOSIS — R45851 Suicidal ideations: Secondary | ICD-10-CM | POA: Diagnosis not present

## 2023-11-03 DIAGNOSIS — F209 Schizophrenia, unspecified: Secondary | ICD-10-CM | POA: Diagnosis not present

## 2023-11-03 DIAGNOSIS — F259 Schizoaffective disorder, unspecified: Secondary | ICD-10-CM | POA: Insufficient documentation

## 2023-11-03 DIAGNOSIS — E119 Type 2 diabetes mellitus without complications: Secondary | ICD-10-CM | POA: Diagnosis not present

## 2023-11-03 DIAGNOSIS — Z91040 Latex allergy status: Secondary | ICD-10-CM | POA: Insufficient documentation

## 2023-11-03 DIAGNOSIS — F4325 Adjustment disorder with mixed disturbance of emotions and conduct: Secondary | ICD-10-CM | POA: Diagnosis present

## 2023-11-03 DIAGNOSIS — E039 Hypothyroidism, unspecified: Secondary | ICD-10-CM | POA: Insufficient documentation

## 2023-11-03 LAB — ETHANOL: Alcohol, Ethyl (B): <15 mg/dL (ref <15)

## 2023-11-03 LAB — COMPREHENSIVE METABOLIC PANEL WITH GFR
ALT: 11 U/L (ref 0–44)
AST: 15 U/L (ref 15–41)
Albumin: 3.8 g/dL (ref 3.5–5.0)
Alkaline Phosphatase: 61 U/L (ref 38–126)
Anion gap: 7 (ref 5–15)
BUN: 13 mg/dL (ref 6–20)
CO2: 25 mmol/L (ref 22–32)
Calcium: 9.3 mg/dL (ref 8.9–10.3)
Chloride: 111 mmol/L (ref 98–111)
Creatinine, Ser: 0.96 mg/dL (ref 0.61–1.24)
GFR, Estimated: >60 mL/min (ref >60)
Glucose, Bld: 104 mg/dL — ABNORMAL HIGH (ref 70–99)
Potassium: 3.9 mmol/L (ref 3.5–5.1)
Sodium: 143 mmol/L (ref 135–145)
Total Bilirubin: 0.6 mg/dL (ref 0.0–1.2)
Total Protein: 6.2 g/dL — ABNORMAL LOW (ref 6.5–8.1)

## 2023-11-03 LAB — CBC
HCT: 31.4 % — ABNORMAL LOW (ref 39.0–52.0)
Hemoglobin: 10.6 g/dL — ABNORMAL LOW (ref 13.0–17.0)
MCH: 28.9 pg (ref 26.0–34.0)
MCHC: 33.8 g/dL (ref 30.0–36.0)
MCV: 85.6 fL (ref 80.0–100.0)
Platelets: 140 10*3/uL — ABNORMAL LOW (ref 150–400)
RBC: 3.67 MIL/uL — ABNORMAL LOW (ref 4.22–5.81)
RDW: 15.6 % — ABNORMAL HIGH (ref 11.5–15.5)
WBC: 4.7 10*3/uL (ref 4.0–10.5)
nRBC: 0 % (ref 0.0–0.2)

## 2023-11-03 LAB — URINE DRUG SCREEN, QUALITATIVE (ARMC ONLY)
Amphetamines, Ur Screen: NOT DETECTED
Barbiturates, Ur Screen: NOT DETECTED
Benzodiazepine, Ur Scrn: NOT DETECTED
Cannabinoid 50 Ng, Ur ~~LOC~~: NOT DETECTED
Cocaine Metabolite,Ur ~~LOC~~: NOT DETECTED
MDMA (Ecstasy)Ur Screen: NOT DETECTED
Methadone Scn, Ur: NOT DETECTED
Opiate, Ur Screen: NOT DETECTED
Phencyclidine (PCP) Ur S: NOT DETECTED
Tricyclic, Ur Screen: NOT DETECTED

## 2023-11-03 LAB — SALICYLATE LEVEL: Salicylate Lvl: <7 mg/dL — ABNORMAL LOW (ref 7.0–30.0)

## 2023-11-03 LAB — ACETAMINOPHEN LEVEL: Acetaminophen (Tylenol), Serum: <10 ug/mL — ABNORMAL LOW (ref 10–30)

## 2023-11-03 NOTE — ED Notes (Signed)
 RN called, Amparo Balk Legal Guardian Emergency Contact [phone]   No answer, left VM.  RN additionally called non emergency number for [employer] to request to speak with on call social worker for [employer] as [place] listed for guardianship of patient. Left return phone number.

## 2023-11-03 NOTE — ED Triage Notes (Addendum)
 BIB [facility] from [facility] where he resides. Pt found with pillow over head, stating that he wanted to kill himself. Hx of schizophrenia. VSS with EMS, pt calm and cooperative. C/o chronic pack pain. Pt alert and oriented X4, cooperative, RR even and unlabored, color WNL. Pt in NAD.  Per [facility] provided from [facility], olanzapine  3.75mg  tablet at bedtime started 11/01/22, pt states that this is a new medication. Pt also recently placed at [facility] and reports mixed feelings about the facility.

## 2023-11-03 NOTE — ED Notes (Signed)
VOL/ Psych Consult ordered 

## 2023-11-03 NOTE — BH Assessment (Signed)
 Comprehensive Clinical Assessment (CCA) Note  11/03/2023 Derrick Atkins [id number]  Chief Complaint:  Chief Complaint  Patient presents with   Suicidal   Visit Diagnosis: Schizophrenia   Derrick Atkins is a 52-year male who presents to the ER due to having thoughts of ending his life. Last night he used a pillow to cover his face to end his life. He states he is hearing voices that are saying bad things about him and this have gone on for approximately two years. He currently denies SI/HI and V/H. He states she don't know what cause the change but want to return to the care facility.  During the interview, the patient was calm, cooperative and pleasant. He was able to provide appropriate answers to the questions.  He currently denies SI/HI and V/H.  CCA Screening, Triage and Referral (STR)  Patient Reported Information How did you hear about us ? Self  What Is the Reason for Your Visit/Call Today? Patient from Care Home do to having thoughts of ending his life. He states he put a pillow over his face to end his life.  How Long Has This Been Causing You Problems? 1 wk - 1 month  What Do You Feel Would Help You the Most Today? Treatment for Depression or other mood problem   Have You Recently Had Any Thoughts About Hurting Yourself? Yes  Are You Planning to Commit Suicide/Harm Yourself At This time? Yes   Flowsheet Row ED from 11/03/2023 in [facility] Emergency Department at [facility] ED from 10/26/2023 in [facility] Emergency Department at [facility] ED from 08/29/2023 in [facility] Emergency Department at [facility]  C-SSRS RISK CATEGORY High Risk High Risk No Risk       Have you Recently Had Thoughts About Hurting Someone Derrick Atkins? No  Are You Planning to Harm Someone at This Time? No  Explanation: No data recorded  Have You Used Any Alcohol  or Drugs in the Past 24 Hours? No  How Long Ago Did You Use Drugs or Alcohol ? No data recorded What Did You  Use and How Much? No data recorded  Do You Currently Have a Therapist/Psychiatrist? Yes  Name of Therapist/Psychiatrist: Name of Therapist/Psychiatrist: Via the care home   Have You Been Recently Discharged From Any Office Practice or Programs? No  Explanation of Discharge From Practice/Program: No data recorded    CCA Screening Triage Referral Assessment Type of Contact: Face-to-Face  Telemedicine Service Delivery:   Is this Initial or Reassessment?   Date Telepsych consult ordered in [facility]:    Time Telepsych consult ordered in [facility]:    Location of Assessment: [facility] ED  Provider Location: [facility] ED   Collateral Involvement: No data recorded  Does Patient Have a Court Appointed Legal Guardian? Yes Other:  Legal Guardian Contact Information: Amparo Balk  Legal Guardian  Emergency Contact  [phone]  Copy of Legal Guardianship Form: No - copy requested  Legal Guardian Notified of Arrival: Successfully notified Isa Manuel)  Legal Guardian Notified of Pending Discharge: Successfully notified  If Minor and Not Living with Parent(s), Who has Custody? No data recorded Is CPS involved or ever been involved? Never  Is APS involved or ever been involved? Never   Patient Determined To Be At Risk for Harm To Self or Others Based on Review of Patient Reported Information or Presenting Complaint? Yes, for Self-Harm  Method: No data recorded Availability of Means: No data recorded Intent: No data recorded Notification Required: No data recorded Additional Information for  Danger to Others Potential: No data recorded Additional Comments for Danger to Others Potential: No data recorded Are There Guns or Other Weapons in Your Home? No  Types of Guns/Weapons: No data recorded Are These Weapons Safely Secured?                            No  Who Could Verify You Are Able To Have These Secured: No data recorded Do You Have any Outstanding Charges, Pending Court Dates, Parole/Probation?  No data recorded Contacted To Inform of Risk of Harm To Self or Others: No data recorded   Does Patient Present under Involuntary Commitment? No   [place] of Residence: [place]   Patient Currently Receiving the Following Services: Medication Management   Determination of Need: Emergent (2 hours)   Options For Referral: ED Visit   CCA Biopsychosocial Patient Reported Schizophrenia/Schizoaffective Diagnosis in Past: No   Strengths: Stable housing, some insight and pleasant.   Mental Health Symptoms Depression:  Change in energy/activity; Difficulty Concentrating; Worthlessness   Duration of Depressive symptoms: Duration of Depressive Symptoms: Greater than two weeks   Mania:  N/A   Anxiety:   N/A   Psychosis:  None   Duration of Psychotic symptoms: Duration of Psychotic Symptoms: N/A   Trauma:  N/A   Obsessions:  N/A   Compulsions:  N/A   Inattention:  N/A   Hyperactivity/Impulsivity:  N/A   Oppositional/Defiant Behaviors:  N/A   Emotional Irregularity:  N/A   Other Mood/Personality Symptoms:  No data recorded   Mental Status Exam Appearance and self-care  Stature:  Average   Weight:  Average weight   Clothing:  Neat/clean; Age-appropriate   Grooming:  Normal   Cosmetic use:  None   Posture/gait:  Normal   Motor activity:  -- (Within normal range)   Sensorium  Attention:  Normal   Concentration:  Normal   Orientation:  X5   Recall/memory:  Normal   Affect and Mood  Affect:  Appropriate; Depressed; Full Range   Mood:  Depressed   Relating  Eye contact:  Normal   Facial expression:  Responsive   Attitude toward examiner:  Cooperative   Thought and Language  Speech flow: Clear and Coherent   Thought content:  Appropriate to Mood and Circumstances   Preoccupation:  None   Hallucinations:  Auditory   Organization:  Coherent; Intact   [employer] of Knowledge:  Fair   Intelligence:  Average    Abstraction:  Functional   Judgement:  Poor   Reality Testing:  Adequate   Insight:  Fair   Decision Making:  Impulsive   Social Functioning  Social Maturity:  Impulsive   Social Judgement:  Heedless   Stress  Stressors:  Transitions   Coping Ability:  Exhausted   Skill Deficits:  None   Supports:  Friends/Service system; Support needed     Religion: Religion/Spirituality Are You A Religious Person?: No  Leisure/Recreation: Leisure / Recreation Do You Have Hobbies?: No  Exercise/Diet: Exercise/Diet Do You Exercise?: No Have You Gained or Lost A Significant Amount of Weight in the Past Six Months?: No Do You Follow a Special Diet?: No Do You Have Any Trouble Sleeping?: No   CCA Employment/Education Employment/Work Situation: Employment / Work Situation Employment Situation: On disability Why is Patient on Disability: Mental Health How Long has Patient Been on Disability: Unable to quantify Patient's Job has Been Impacted by Current Illness: No Has  Patient ever Been in the Military?: No  Education: Education Is Patient Currently Attending School?: No Did You Attend College?: No Did You Have An Individualized Education Program (IIEP): No Did You Have Any Difficulty At School?: No Patient's Education Has Been Impacted by Current Illness: No   CCA Family/Childhood History Family and Relationship History: Family history Marital status: Single Does patient have children?: No  Childhood History:  Childhood History By whom was/is the patient raised?: Both parents Did patient suffer any verbal/emotional/physical/sexual abuse as a child?: No Did patient suffer from severe childhood neglect?: No Has patient ever been sexually abused/assaulted/raped as an adolescent or adult?: No Was the patient ever a victim of a crime or a disaster?: No Witnessed domestic violence?: No Has patient been affected by domestic violence as an adult?: No   CCA Substance  Use Alcohol /Drug Use: Alcohol  / Drug Use Pain Medications: See MAR Prescriptions: See MAR Over the Counter: See MAR History of alcohol  / drug use?: No history of alcohol  / drug abuse Longest period of sobriety (when/how long): N/A    ASAM's:  Six Dimensions of Multidimensional Assessment  Dimension 1:  Acute Intoxication and/or Withdrawal Potential:      Dimension 2:  Biomedical Conditions and Complications:      Dimension 3:  Emotional, Behavioral, or Cognitive Conditions and Complications:     Dimension 4:  Readiness to Change:     Dimension 5:  Relapse, Continued use, or Continued Problem Potential:     Dimension 6:  Recovery/Living Environment:     ASAM Severity Score:    ASAM Recommended Level of Treatment:     Substance use Disorder (SUD)    Recommendations for Services/Supports/Treatments:    Disposition Recommendation per psychiatric provider: There are no psychiatric contraindications to discharge at this time   DSM5 Diagnoses: Patient Active Problem List   Diagnosis Date Noted   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   Intractable nausea and vomiting 05/25/2023   Leukopenia 05/25/2023   Right sided weakness 05/25/2023   Acute encephalopathy 05/25/2023   Altered mental status 04/12/2023   Leg swelling 04/12/2023   Ambulatory dysfunction 02/26/2023   DM2 (diabetes mellitus, type 2) (HCC) 02/26/2023   Hyperlipidemia 02/26/2023   Prolonged QT interval 02/26/2023   Normocytic anemia 02/26/2023   Thrombocytopenia (HCC) 02/21/2023   Rhabdomyolysis 02/21/2023   Hypoglycemia 02/21/2023   Abnormal LFTs 02/21/2023   Fall at home, initial encounter 02/21/2023   Cellulitis of left lower extremity 01/06/2023   Iron  deficiency anemia 01/06/2023   Cellulitis and abscess of left lower extremity 01/06/2023   Nausea & vomiting 01/06/2023   AKI (acute kidney injury) (HCC) 12/13/2022   Dyslipidemia 12/13/2022   Schizophrenia (HCC)  03/23/2022   Aggressive behavior    Schizophrenia, paranoid, chronic (HCC)    Hypothyroidism 12/25/2013   Essential hypertension 05/03/2007   Type 2 diabetes mellitus without complications (HCC) 05/03/2007     Referrals to Alternative Service(s): Referred to Alternative Service(s):   Place:   Date:   Time:    Referred to Alternative Service(s):   Place:   Date:   Time:    Referred to Alternative Service(s):   Place:   Date:   Time:    Referred to Alternative Service(s):   Place:   Date:   Time:     Bryce Captain MS, LCAS, [facility], [facility] Therapeutic Triage Specialist 11/03/2023 10:20 AM

## 2023-11-03 NOTE — ED Notes (Addendum)
 Dressed out into behavioral scrubs by RN and EDT. Belongings placed in 1 labeled belonging bag include jeans, belt, socks, shoes and one shirt.   RN provided water, blanket, and pillow. Pt is unable to provide urine sample at this time.

## 2023-11-03 NOTE — ED Notes (Addendum)
 Attempted to call [facility] with no answer; voicemail box is full so unable to leave message.

## 2023-11-03 NOTE — ED Provider Notes (Signed)
 Emergency department handoff note  Care of this patient was signed out to me at the end of the previous provider shift.  All pertinent patient information was conveyed and all questions were answered.  Patient pending psychiatric evaluation that is found that patient is stable for discharge at this time and follow-up with outpatient resources.  The patient has been reexamined and is ready to be discharged.  All diagnostic results have been reviewed and discussed with the patient/family.  Care plan has been outlined and the patient/family understands all current diagnoses, results, and treatment plans.  There are no new complaints, changes, or physical findings at this time.  All questions have been addressed and answered.  Patient was instructed to, and agrees to follow-up with their primary care physician as well as return to the emergency department if any new or worsening symptoms develop.   Breelyn Icard K, MD 11/03/23 [phone]

## 2023-11-03 NOTE — ED Notes (Signed)
Pt given breakfast tray and beverage.  

## 2023-11-03 NOTE — Discharge Instructions (Addendum)
 Outpatient psychiatric Services  Walk in hours for medication management Monday, Wednesday, Thursday, and Friday from 8:00 AM to 11:00 AM Recommend arriving by by 7:30 AM.  It is first come first serve.    Walk in hours for therapy intake Monday and Wednesday only 8:00 AM to 11:00 AM Encouraged to arrive by 7:30 AM.  It is first come first serve   Inpatient patient psychiatric services The [facility] Crisis [facility] offers comprehensive behavioral heath care services for mental health and substance abuse treatment.  Social work can also assist with referral to or getting you into a rehabilitation program short or long term  Set designer Resources:  Intensive Outpatient Programs: [employer]      [address] [place], [place] [phone] Both a day and evening program       [facility] Outpatient     [address]        [place], [place] [address] [phone]         ADS: [facility] [address] [place] [phone]  [facility] ACCESS LINE: [phone] or [phone] [address] [place], [place] [address] [email or web address]   Substance Abuse Resources: [facility]  [phone] [facility] [phone] [facility] [place] [phone] Floydene Flock [phone] Residential & Outpatient Substance Abuse Program  [phone]  Psychological Services: [facility]  [phone] [facility]  [phone] [facility], [phone] [place]. [address], [place], ACCESS LINE: [phone] or [phone], [email or web address]  Mobile Crisis Teams:                                        Therapeutic Alternatives         Mobile Crisis Care Unit [phone]             [facility] [address] Ginette Otto [phone]                                          Interventionist [address] [place] [phone]  Self-Help/Support Groups: [facility] Health [facility]oc[facility] of [employer] of support groups [phone] (call for more info)  Narcotics Anonymous (NA) [facility] [address] [place] - 2 meetings at this location  Residential Treatment Programs:  [facility]      [address]        [place]       [phone]         [facility] [address], [place] [address]80 [place], [place]  [address] [phone]  [facility] Treatment Facility  [address] [place], [place] [address] [phone] Admissions: 8am-3pm M-F  [facility]     [address]        [place], [place] [address]       [phone]         [facility] [address] Starling Manns [place], [place] [phone]  [facility] [address] [place], [place] [phone]  [facility] - Intensive Outpatient      [address]     [place], [place]       [phone]  [facility] ([facility]     [address] [place], [place] [phone] or [phone]  Residential Treatment Services (RTS), Medicaid [address] [place], [place] [phone]  [facility] [address]                                               [address] [place] [phone]  [facility] Resources: [phone]               General Therapy                                                Angie Fava, PhD        [address] [place], [place] [address]         [phone]   Insurance  [facility] Behavioral   [address] [place], [place] [address] [phone]  [facility] Recovery [address] [place], [place] [address] [phone] Insurance/Medicaid/sponsorship through [facility]                                               [address]                                        [place], [place] [address]    Therapy/tele-psych/case         [phone]          [facility] [address], [place]  [address]  Adolescent/group home/case management [phone]                                           Creola Corn PhD       General therapy       Insurance   [phone]         Dr. Lolly Mustache, [place], M-F [phone]  [facility]  [employer] [facility] Dept. [address]                 [address]         [address] [place] [address]  [place]                                               Cristobal Goldmann Phone:  [phone]  Phone:  [phone]                   Phone:  [phone]  [facility], [phone] [facility]- [phone]       -     [facility] in [place], [address],             [phone], Insurance

## 2023-11-03 NOTE — ED Notes (Signed)
 Return phone call from Isa Manuel with [place] [facility]. RN notified of pt's arrival.

## 2023-11-03 NOTE — ED Notes (Signed)
Calvin at the bedside for pt evaluation  

## 2023-11-03 NOTE — ED Notes (Signed)
 Attempted to call [facility] to update of pending discharge, arrange for transportation and give report to facility. No answer at this time.

## 2023-11-03 NOTE — ED Notes (Signed)
 Patient provided snack at appropriate snack time.  Pt consumed 100% of snack provided, tolerated well w/o complaints

## 2023-11-03 NOTE — ED Notes (Signed)
 Spoke with pt LG to inform her of pt plan of care. Verbalized understanding and asked for out patient resources for pt reoccurring SI.

## 2023-11-03 NOTE — ED Provider Notes (Signed)
 [facility] Provider Note    Event Date/Time   First MD Initiated Contact with Patient 11/03/23 [phone]     (approximate)   History   Suicidal   HPI  Derrick Atkins is a 53 y.o. male brought to the ED via EMS from [facility] with a chief complaint of suicidal gesture.  Patient was found with a pillow over his head, stating he wanted to kill himself.  Patient is a new resident to [facility] and reports mixed feelings about the facility.  Denies HI/AH/VH.  Voices no medical complaints.     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   Intractable nausea and vomiting 05/25/2023   Leukopenia 05/25/2023   Right sided weakness 05/25/2023   Acute encephalopathy 05/25/2023   Altered mental status 04/12/2023   Leg swelling 04/12/2023   Ambulatory dysfunction 02/26/2023   DM2 (diabetes mellitus, type 2) (HCC) 02/26/2023   Hyperlipidemia 02/26/2023   Prolonged QT interval 02/26/2023   Normocytic anemia 02/26/2023   Thrombocytopenia (HCC) 02/21/2023   Rhabdomyolysis 02/21/2023   Hypoglycemia 02/21/2023   Abnormal LFTs 02/21/2023   Fall at home, initial encounter 02/21/2023   Cellulitis of left lower extremity 01/06/2023   Iron  deficiency anemia 01/06/2023   Cellulitis and abscess of left lower extremity 01/06/2023   Nausea & vomiting 01/06/2023   AKI (acute kidney injury) (HCC) 12/13/2022   Dyslipidemia 12/13/2022   Schizophrenia (HCC) 03/23/2022   Aggressive behavior    Schizophrenia, paranoid, chronic (HCC)    Hypothyroidism 12/25/2013   Essential hypertension 05/03/2007   Type 2 diabetes  mellitus without complications (HCC) 05/03/2007     Past Surgical History   Past Surgical History:  Procedure Laterality Date   BOWEL RESECTION     CATARACT EXTRACTION W/PHACO Right 09/15/2022   Procedure: CATARACT EXTRACTION PHACO AND INTRAOCULAR LENS PLACEMENT (IOC) RIGHT DIABETIC VISION BLUE HEALON 5  15.79  01:31.9;  Surgeon: Annell Kidney, MD;  Location: [facility];  Service: Ophthalmology;  Laterality: Right;  Latex Diabetic   INCISION AND DRAINAGE ABSCESS Left 12/20/2022   Procedure: INCISION AND DRAINAGE ABSCESS;  Surgeon: Flynn Hylan, MD;  Location: [facility] OR[facility];  Service: General;  Laterality: Left;   LAPAROTOMY N/A 05/15/2020   Procedure: EXPLORATORY LAPAROTOMY;  Surgeon: Emmalene Hare, MD;  Location: [facility] ORS;  Service: General;  Laterality: N/A;     Home Medications   Prior to Admission medications   Medication Sig Start Date End Date Taking? Authorizing Provider  ABILIFY  MAINTENA 400 MG PRSY prefilled syringe Inject 400 mg into the muscle every 28 (twenty-eight) days. Due 11/15-11/20    [provider]  acetaminophen  (TYLENOL ) 500 MG tablet Take 1,000 mg by mouth 3 (three) times daily as needed for mild pain or moderate pain.    [provider]  ascorbic acid  (VITAMIN C ) 500 MG tablet Take 1 tablet (500 mg total) by mouth daily. 12/24/22   Aisha Hove, MD  aspirin  EC 81 MG tablet Take 1 tablet (81 mg total) by mouth daily. Swallow whole. 04/09/22   [name], Elida Grounds, MD  benztropine  (COGENTIN ) 1 MG tablet Take 1 tablet (1 mg total) by mouth daily. 04/08/22   [name], Elida Grounds, MD  cholecalciferol  (VITAMIN D3) 25 MCG (1000 UNIT) tablet Take 1,000 Units by mouth daily.    [provider]  Cholecalciferol  25 MCG (1000 UT) TBDP Take 1 tablet by mouth daily. 05/02/23   [provider]  fenofibrate  54 MG tablet Take 54 mg by mouth daily.    [provider]  FLUoxetine (PROZAC) 20 MG capsule Take 20 mg by mouth daily.     [provider]  folic acid  (FOLVITE ) 1 MG tablet Take 1 tablet (1 mg total) by mouth daily. 12/24/22   Aisha Hove, MD  furosemide  (LASIX ) 20 MG tablet Take 20 mg by mouth in the morning. Patient not taking: Reported on 08/29/2023    [provider]  gemfibrozil  (LOPID ) 600 MG tablet Take 600 mg by mouth 2 (two) times daily before a meal.    [provider]  hydrOXYzine  (ATARAX ) 10 MG tablet Take 10 mg by mouth daily. At noon    [provider]  hydrOXYzine  (ATARAX ) 50 MG tablet Take 50 mg by mouth 3 (three) times daily. Patient not taking: Reported on 08/29/2023 03/17/23   [provider]  iron  polysaccharides (NIFEREX) 150 MG capsule Take 1 capsule (150 mg total) by mouth daily. 12/24/22   Aisha Hove, MD  lamoTRIgine  (LAMICTAL ) 25 MG tablet Take 25 mg by mouth 2 (two) times daily.    [provider]  levothyroxine  (SYNTHROID ) 88 MCG tablet Take 1 tablet (88 mcg total) by mouth daily at 6 (six) AM. 04/09/22   [name], Elida Grounds, MD  linaclotide (LINZESS) 145 MCG CAPS capsule Take 145 mcg by mouth daily before breakfast.    [provider]  loperamide  (IMODIUM ) 2 MG capsule Take 4 mg by mouth 2 (two) times daily. 03/17/23   [provider]  metoprolol  tartrate (LOPRESSOR ) 25 MG tablet Take 1 tablet (25 mg total) by mouth 2 (two) times daily. 04/18/23   Aisha Hove, MD  OLANZapine  (ZYPREXA ) 10 MG tablet Take 10 mg by mouth at bedtime. (Take with 20mg  tablet to equal 30mg  total)    [provider]  OLANZapine  (ZYPREXA ) 20 MG tablet Take 1 tablet (20 mg total) by mouth at bedtime. Patient taking differently: Take 20 mg by mouth at bedtime. (Take with 10mg  tablet to equal 30mg  total) 04/08/22   [name], Elida Grounds, MD  OLANZapine  (ZYPREXA ) 5 MG tablet Add to your existing 20 mg dose of olanzapine  for nightly dose of 25 mg total 10/26/23   Buell Carmin, MD  pantoprazole  (PROTONIX ) 40 MG tablet Take 1 tablet  (40 mg total) by mouth daily before breakfast. 05/28/23   Amin, Ankit C, MD  polyethylene glycol (MIRALAX  / GLYCOLAX ) 17 g packet Take 17 g by mouth daily.    [provider]  potassium chloride  SA (KLOR-CON  M) 20 MEQ tablet Take 20 mEq by mouth daily. 03/29/23   [provider]  rosuvastatin  (CRESTOR ) 5 MG tablet Take 5 mg by mouth daily.    [provider]  senna (SENOKOT) 8.6 MG TABS tablet Take 2 tablets by mouth at bedtime.    [provider]  VENTOLIN  HFA 108 (90 Base) MCG/ACT inhaler Inhale into the lungs every 4 (four) hours as needed. 03/17/23   [provider]     Allergies  Valproic acid  and related, Phenytoin sodium extended, Prednisone, and Latex   Family History   Family  History  Problem Relation Age of Onset   Heart disease Father    Heart attack Father      Physical Exam  Triage Vital Signs: ED Triage Vitals [11/03/23 0501]  Encounter Vitals Group     BP 119/73     Systolic BP Percentile      Diastolic BP Percentile      Pulse Rate 66     Resp 18     Temp 97.8 F (36.6 C)     Temp Source Oral     SpO2 100 %     Weight 174 lb 2.6 oz (79 kg)     Height 6\' 4"  (1.93 m)     Head Circumference      Peak Flow      Pain Score 4     Pain Loc      Pain Education      Exclude from Growth Chart     Updated Vital Signs: BP 119/73 (BP Location: Right Arm)   Pulse 66   Temp 97.8 F (36.6 C) (Oral)   Resp 18   Ht 6\' 4"  (1.93 m)   Wt 79 kg   SpO2 100%   BMI 21.20 kg/m    General: Awake, no distress.  CV:  RRR.  Good peripheral perfusion.  Resp:  Normal effort.  CTAB. Abd:  No distention.  Other:  Flat affect.  Cooperative.   ED Results / Procedures / Treatments  Labs (all labs ordered are listed, but only abnormal results are displayed) Labs Reviewed  CBC  COMPREHENSIVE METABOLIC PANEL WITH GFR  ACETAMINOPHEN  LEVEL  SALICYLATE LEVEL  ETHANOL  URINE DRUG SCREEN, QUALITATIVE ([facility] ONLY)      EKG  None   RADIOLOGY None   Official radiology report(s): No results found.   PROCEDURES:  Critical Care performed: No  Procedures   MEDICATIONS ORDERED IN ED: Medications - No data to display   IMPRESSION / MDM / ASSESSMENT AND PLAN / ED COURSE  I reviewed the triage vital signs and the nursing notes.                             53 year old male who presents with suicidal gesture, placing pillow over his face and expressing suicidal ideation.  Contracts for safety while in the emergency department.  Will obtain basic lab work, consult psychiatry for evaluation and disposition.  Patient's presentation is most consistent with exacerbation of chronic illness.  The patient has been placed in psychiatric observation due to the need to provide a safe environment for the patient while obtaining psychiatric consultation and evaluation, as well as ongoing medical and medication management to treat the patient's condition.  The patient has not been placed under full IVC at this time.    FINAL CLINICAL IMPRESSION(S) / ED DIAGNOSES   Final diagnoses:  Suicidal ideation  Schizoaffective disorder, unspecified type (HCC)     Rx / DC Orders   ED Discharge Orders     None        Note:  This document was prepared using Dragon voice recognition software and may include unintentional dictation errors.   Shakeeta Godette J, MD 11/03/23 [id number]

## 2023-11-03 NOTE — ED Notes (Signed)
Pt provided with lunch tray and beverage  

## 2023-11-03 NOTE — ED Notes (Signed)
 Attempted to call [facility] and operator answered. Informed of pending discharge. Placed on hold so nursing staff could be made aware.

## 2023-11-03 NOTE — Consult Note (Signed)
 [facility] Psychiatric Consult Initial  Patient Name: .Derrick Atkins  MRN: [id number]  DOB: [birth year]  Consult Order details:  Orders (From admission, onward)     Start     Ordered   11/03/23 0508  IP CONSULT TO PSYCHIATRY       Ordering Provider: Norlene Beavers, MD  Provider:  (Not yet assigned)  Question Answer Comment  Place call to: psychiatry   Reason for Consult Consult   Diagnosis/Clinical Info for Consult: SI      11/03/23 0507   11/03/23 0507  CONSULT TO CALL ACT TEAM       Ordering Provider: Norlene Beavers, MD  Provider:  (Not yet assigned)  Question:  Reason for Consult?  Answer:  Psych consult   11/03/23 0507             Mode of Visit: Tele-visit Virtual Statement:TELE PSYCHIATRY ATTESTATION & CONSENT As the provider for this telehealth consult, I attest that I verified the patient's identity using two separate identifiers, introduced myself to the patient, provided my credentials, disclosed my location, and performed this encounter via a HIPAA-compliant, real-time, face-to-face, two-way, interactive audio and video platform and with the full consent and agreement of the patient (or guardian as applicable.) Patient physical location: [place] [facility]. Telehealth provider physical location: home office in state of [place].   Video start time: 1000 Video end time: 1020    Psychiatry Consult Evaluation  Service Date: November 03, 2023 LOS:  LOS: 0 days  Chief Complaint "I was upset"  Primary Psychiatric Diagnoses  Adjustment disorder with mixed disturbance of emotions and conduct 2.  schizophrenia 3.    Assessment  Derrick Atkins is a 53 y.o. male admitted: Presented to the EDfor 11/03/2023  4:57 AM for making a suicidal gesture at his group home. He carries the psychiatric diagnoses of adjustment disorder, MDD, schizophrenia.   Please see plan below for detailed recommendations.   Diagnoses:  Active Hospital problems: Principal Problem:   Adjustment disorder with  mixed disturbance of emotions and conduct    Plan   ## Psychiatric Medication Recommendations:  - continue home medication, no changes at this time  ## Medical Decision Making Capacity: Patient has a guardian and has thus been adjudicated incompetent; please involve patients guardian in medical decision making   ## Disposition:-- There are no psychiatric contraindications to discharge at this time  ## Behavioral / Environmental: - No specific recommendations at this time.     ## Safety and Observation Level:  - Based on my clinical evaluation, I estimate the patient to be at low risk of self harm in the current setting. - At this time, we recommend  routine. This decision is based on my review of the chart including patient's history and current presentation, interview of the patient, mental status examination, and consideration of suicide risk including evaluating suicidal ideation, plan, intent, suicidal or self-harm behaviors, risk factors, and protective factors. This judgment is based on our ability to directly address suicide risk, implement suicide prevention strategies, and develop a safety plan while the patient is in the clinical setting. Please contact our team if there is a concern that risk level has changed.  CSSR Risk Category:C-SSRS RISK CATEGORY: High Risk  Suicide Risk Assessment: Patient has following modifiable risk factors for suicide: social isolation, which we are addressing by resources provided in AVS. Patient has following non-modifiable or demographic risk factors for suicide: male gender Patient has the following protective factors against suicide:  Access to outpatient mental health care, Supportive family, and Cultural, spiritual, or religious beliefs that discourage suicide  Thank you for this consult request. Recommendations have been communicated to the primary team.  We will psych clear for discharge at this time.   Roise Cleaver, NP       History  of Present Illness  Relevant Aspects of Hospital ED Course:  Derrick Atkins is a 53 y.o. male brought to the ED via EMS from [facility] with a chief complaint of suicidal gesture.  Patient was found with a pillow over his head, stating he wanted to kill himself.  Patient is a new resident to [facility] and reports mixed feelings about the facility.  Denies HI/AH/VH.  Voices no medical complaints.   Patient Report:  Pt seen at [facility] for psychiatric evaluation.  Patient explains he was recently placed in [facility], the group home, last week.  Patient stated he was assigned a new roommate yesterday and he felt like the roommate was not happy about him being married.  This triggered the patient becoming upset that he started having suicidal ideations.  Patient stated he put the pillow over his head and said that he was homicidal.  The staff member sent him to the hospital for an evaluation.  Today patient is feeling better.  He had no acute events overnight.  Today he denies having any suicidal ideations.  Denies HI.  Denies any access to weapons or firearms.  Denies AVH.  He denies any abuse or neglect at his new group home. He does feel safe returning back to the group home today. Tried to discuss different coping skills he can utilize when he feels upset over changes that occur. He does mention coping skills of trying to talk to staff, breathing exercises, journaling. Pt does report medication compliance. He denies any illicit substances or alcohol . He is wanting to discharge at this time.   During evaluation Derrick Atkins is sitting in no acute distress.  He is alert, oriented x 4, calm, cooperative and attentive.  Hisr mood is euthymic with congruent affect.  He has normal speech, and behavior.  Objectively there is no evidence of psychosis/mania or delusional thinking.  Patient is able to converse coherently, goal directed thoughts, no distractibility, or pre-occupation.  He also denies  suicidal/self-harm/homicidal ideation, psychosis, and paranoia.  Patient answered question appropriately.     I attempted to call his Alonna James, at [phone] with no response. It does appear patient has had similar episodes like this presentation before, and is usually discharged with safety plans in place. Will psych clear the patient. If LG has any concerns please reach out to psychiatry as well will try and contact her again to discuss any concerns with discharge.     Review of Systems  Psychiatric/Behavioral:  Positive for depression.   All other systems reviewed and are negative.    Psychiatric and Social History  Psychiatric History:  Information collected from patient, chart  Prev Dx/Sx: adjustment disorder, MDD, schizophrenia Current Psych Provider: yes Home Meds (current): see mar Previous Med Trials: unknown Therapy: yes  Prior Psych Hospitalization: yes  Prior Self Harm: denies Prior Violence: denies   Social History:   Living Situation: [facility] group home Spiritual Hx: yes Access to weapons/lethal means: denies   Substance History Alcohol : denies  Tobacco: denies Illicit drugs: denies   Exam Findings  Physical Exam:  Vital Signs:  Temp:  [97.8 F (36.6 C)-97.9  F (36.6 C)] 97.9 F (36.6 C) (04/24 0714) Pulse Rate:  [57-66] 57 (04/24 0714) Resp:  [18-19] 19 (04/24 0714) BP: (107-119)/(63-73) 107/63 (04/24 0714) SpO2:  [99 %-100 %] 99 % (04/24 0714) Weight:  [79 kg] 79 kg (04/24 0501) Blood pressure 107/63, pulse (!) 57, temperature 97.9 F (36.6 C), temperature source Oral, resp. rate 19, height 6\' 4"  (1.93 m), weight 79 kg, SpO2 99%. Body mass index is 21.2 kg/m.  Physical Exam Vitals and nursing note reviewed.  Neurological:     Mental Status: He is alert and oriented to person, place, and time.     Mental Status Exam: General Appearance: Fairly Groomed  Orientation:  Full (Time, Place, and Person)  Memory:  Immediate;    Good Recent;   Good  Concentration:  Concentration: Good  Recall:  Good  Attention  Fair  Eye Contact:  Good  Speech:  Clear and Coherent  Language:  Good  Volume:  Normal  Mood: "good"  Affect:  Appropriate  Thought Process:  Goal Directed  Thought Content:  WDL  Suicidal Thoughts:  No  Homicidal Thoughts:  No  Judgement:  Fair  Insight:  Fair  Psychomotor Activity:  Normal  Akathisia:  No  Fund of Knowledge:  Fair      Assets:  Engineer, maintenance Physical Health  Cognition:  WNL  ADL's:  Intact  AIMS (if indicated):        Other History   These have been pulled in through the EMR, reviewed, and updated if appropriate.  Family History:  The patient's family history includes Heart attack in his father; Heart disease in his father.  Medical History: Past Medical History:  Diagnosis Date   Cerebral palsy (HCC)    Depression    Diabetes mellitus without complication (HCC)    Hypertension    Kidney stones    Schizoaffective disorder (HCC)    Scoliosis     Surgical History: Past Surgical History:  Procedure Laterality Date   BOWEL RESECTION     CATARACT EXTRACTION W/PHACO Right 09/15/2022   Procedure: CATARACT EXTRACTION PHACO AND INTRAOCULAR LENS PLACEMENT (IOC) RIGHT DIABETIC VISION BLUE HEALON 5  15.79  01:31.9;  Surgeon: Annell Kidney, MD;  Location: [facility];  Service: Ophthalmology;  Laterality: Right;  Latex Diabetic   INCISION AND DRAINAGE ABSCESS Left 12/20/2022   Procedure: INCISION AND DRAINAGE ABSCESS;  Surgeon: Flynn Hylan, MD;  Location: [facility];  Service: General;  Laterality: Left;   LAPAROTOMY N/A 05/15/2020   Procedure: EXPLORATORY LAPAROTOMY;  Surgeon: Emmalene Hare, MD;  Location: [facility];  Service: General;  Laterality: N/A;     Medications:  No current facility-administered medications for this encounter.  Current Outpatient Medications:    ABILIFY  MAINTENA 400 MG PRSY prefilled syringe, Inject 400 mg  into the muscle every 28 (twenty-eight) days. Due 11/15-11/20, Disp: , Rfl:    acetaminophen  (TYLENOL ) 500 MG tablet, Take 1,000 mg by mouth 3 (three) times daily as needed for mild pain or moderate pain., Disp: , Rfl:    ascorbic acid  (VITAMIN C ) 500 MG tablet, Take 1 tablet (500 mg total) by mouth daily., Disp: 30 tablet, Rfl: 3   aspirin  EC 81 MG tablet, Take 1 tablet (81 mg total) by mouth daily. Swallow whole., Disp: 30 tablet, Rfl: 12   benztropine  (COGENTIN ) 1 MG tablet, Take 1 tablet (1 mg total) by mouth daily., Disp: 30 tablet, Rfl: 1   cholecalciferol  (VITAMIN D3) 25 MCG (1000 UNIT) tablet, Take  1,000 Units by mouth daily., Disp: , Rfl:    Cholecalciferol  25 MCG (1000 UT) TBDP, Take 1 tablet by mouth daily., Disp: , Rfl:    fenofibrate  54 MG tablet, Take 54 mg by mouth daily., Disp: , Rfl:    FLUoxetine (PROZAC) 20 MG capsule, Take 20 mg by mouth daily., Disp: , Rfl:    folic acid  (FOLVITE ) 1 MG tablet, Take 1 tablet (1 mg total) by mouth daily., Disp: 30 tablet, Rfl: 3   furosemide  (LASIX ) 20 MG tablet, Take 20 mg by mouth in the morning. (Patient not taking: Reported on 08/29/2023), Disp: , Rfl:    gemfibrozil  (LOPID ) 600 MG tablet, Take 600 mg by mouth 2 (two) times daily before a meal., Disp: , Rfl:    hydrOXYzine  (ATARAX ) 10 MG tablet, Take 10 mg by mouth daily. At noon, Disp: , Rfl:    hydrOXYzine  (ATARAX ) 50 MG tablet, Take 50 mg by mouth 3 (three) times daily. (Patient not taking: Reported on 08/29/2023), Disp: , Rfl:    iron  polysaccharides (NIFEREX) 150 MG capsule, Take 1 capsule (150 mg total) by mouth daily., Disp: 30 capsule, Rfl: 3   lamoTRIgine  (LAMICTAL ) 25 MG tablet, Take 25 mg by mouth 2 (two) times daily., Disp: , Rfl:    levothyroxine  (SYNTHROID ) 88 MCG tablet, Take 1 tablet (88 mcg total) by mouth daily at 6 (six) AM., Disp: 30 tablet, Rfl: 1   linaclotide (LINZESS) 145 MCG CAPS capsule, Take 145 mcg by mouth daily before breakfast., Disp: , Rfl:    loperamide   (IMODIUM ) 2 MG capsule, Take 4 mg by mouth 2 (two) times daily., Disp: , Rfl:    metoprolol  tartrate (LOPRESSOR ) 25 MG tablet, Take 1 tablet (25 mg total) by mouth 2 (two) times daily., Disp: 60 tablet, Rfl: 2   OLANZapine  (ZYPREXA ) 10 MG tablet, Take 10 mg by mouth at bedtime. (Take with 20mg  tablet to equal 30mg  total), Disp: , Rfl:    OLANZapine  (ZYPREXA ) 20 MG tablet, Take 1 tablet (20 mg total) by mouth at bedtime. (Patient taking differently: Take 20 mg by mouth at bedtime. (Take with 10mg  tablet to equal 30mg  total)), Disp: 30 tablet, Rfl: 1   OLANZapine  (ZYPREXA ) 5 MG tablet, Add to your existing 20 mg dose of olanzapine  for nightly dose of 25 mg total, Disp: 30 tablet, Rfl: 2   pantoprazole  (PROTONIX ) 40 MG tablet, Take 1 tablet (40 mg total) by mouth daily before breakfast., Disp: 30 tablet, Rfl: 0   polyethylene glycol (MIRALAX  / GLYCOLAX ) 17 g packet, Take 17 g by mouth daily., Disp: , Rfl:    potassium chloride  SA (KLOR-CON  M) 20 MEQ tablet, Take 20 mEq by mouth daily., Disp: , Rfl:    rosuvastatin  (CRESTOR ) 5 MG tablet, Take 5 mg by mouth daily., Disp: , Rfl:    senna (SENOKOT) 8.6 MG TABS tablet, Take 2 tablets by mouth at bedtime., Disp: , Rfl:    VENTOLIN  HFA 108 (90 Base) MCG/ACT inhaler, Inhale into the lungs every 4 (four) hours as needed., Disp: , Rfl:   Allergies: Allergies  Allergen Reactions   Valproic Acid  And Related Other (See Comments)    Hyperammonemic encephalopathy   Phenytoin Sodium Extended Other (See Comments) and Nausea And Vomiting   Prednisone Hives and Nausea And Vomiting   Latex Hives, Nausea And Vomiting and Rash    Roise Cleaver, NP

## 2023-11-03 NOTE — ED Notes (Signed)
 Amparo Balk Legal Guardian Emergency Contact [phone] Legal guardian  Returned VM left earlier and gives consent for pt to be treated in the ED. She reports she is best point of contact for further consents needed for patient.

## 2023-11-29 ENCOUNTER — Other Ambulatory Visit: Payer: Self-pay

## 2023-11-29 ENCOUNTER — Emergency Department

## 2023-11-29 DIAGNOSIS — I10 Essential (primary) hypertension: Secondary | ICD-10-CM | POA: Insufficient documentation

## 2023-11-29 DIAGNOSIS — M25571 Pain in right ankle and joints of right foot: Secondary | ICD-10-CM | POA: Insufficient documentation

## 2023-11-29 DIAGNOSIS — M25471 Effusion, right ankle: Secondary | ICD-10-CM | POA: Diagnosis not present

## 2023-11-29 DIAGNOSIS — E119 Type 2 diabetes mellitus without complications: Secondary | ICD-10-CM | POA: Diagnosis not present
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
## 2023-11-29 NOTE — Group Note (Deleted)
 Date:  11/29/2023 Time:  9:45 PM  Group Topic/Focus:  Wrap-Up Group:   The focus of this group is to help patients review their daily goal of treatment and discuss progress on daily workbooks.     Participation Level:  {BHH PARTICIPATION EAVWU:98119}  Participation Quality:  {BHH PARTICIPATION QUALITY:22265}  Affect:  {BHH AFFECT:22266}  Cognitive:  {BHH COGNITIVE:22267}  Insight: {BHH Insight2:20797}  Engagement in Group:  {BHH ENGAGEMENT IN JYNWG:95621}  Modes of Intervention:  {BHH MODES OF INTERVENTION:22269}  Additional Comments:  ***  Maglione,Preciosa Bundrick E 11/29/2023, 9:45 PM

## 2023-11-30 ENCOUNTER — Emergency Department
Admission: EM | Admit: 2023-11-30 | Discharge: 2023-11-30 | Disposition: A | Discharge status: Home / Self Care | Attending: Emergency Medicine | Admitting: Emergency Medicine

## 2023-11-30 DIAGNOSIS — M25571 Pain in right ankle and joints of right foot: Secondary | ICD-10-CM

## 2023-11-30 NOTE — ED Notes (Signed)
 Pt provided with crackers, Ginger Ale, and ice cream. Pt ABCs intact. RR even and unlabored. Pt in NAD. Bed in lowest locked position. Call bell in reach. Denies further needs at this time.

## 2023-11-30 NOTE — ED Notes (Signed)
 Awaiting transportation back to pt facility with [facility].

## 2023-11-30 NOTE — Discharge Instructions (Signed)
 Use the brace for stability and comfort and call either Dr. Larey Plenty [facility]) or [facility]) for an appointment.  Both our foot and ankle specialist.  Take acetaminophen  650 mg and ibuprofen  400 mg every 6 hours for pain.  Take with food. Thank you for choosing us  for your health care today!  Please see your primary doctor this week for a follow up appointment.   If you have any new, worsening, or unexpected symptoms call your doctor right away or come back to the emergency department for reevaluation.  It was my pleasure to care for you today.   Arron Large Margery Sheets, MD

## 2023-11-30 NOTE — ED Notes (Signed)
 Pt legal guardian Candice Gobble called at this time to see if she could pick up patient. Pt made aware that the pt is currently living at [facility] and RN will need to contact them for transportation and report.

## 2023-11-30 NOTE — ED Provider Notes (Signed)
 [facility] Provider Note    Event Date/Time   First MD Initiated Contact with Patient 11/30/23 0016     (approximate)   History   Foot Pain   HPI  Derrick Atkins is a 53 y.o. male   Past medical history of cerebral palsy, depression, diabetes, hypertension and schizoaffective disorder who presents to the Emergency Department with a right ankle swelling and pain after an injury sustained due to some localized trauma and perhaps some twisting injury.  He notes that the ankle appears swollen but he is able to walk on it.  No other injuries or acute medical complaints.   External Medical Documents Reviewed: Outpatient notes from an internal medicine visit from January 24      Physical Exam   Triage Vital Signs: ED Triage Vitals  Encounter Vitals Group     BP 11/29/23 2153 136/70     Systolic BP Percentile --      Diastolic BP Percentile --      Pulse Rate 11/29/23 2153 67     Resp 11/29/23 2153 19     Temp 11/29/23 2153 98 F (36.7 C)     Temp Source 11/29/23 2153 Oral     SpO2 11/29/23 2153 100 %     Weight 11/29/23 2154 174 lb 2.6 oz (79 kg)     Height 11/29/23 2154 6\' 4"  (1.93 m)     Head Circumference --      Peak Flow --      Pain Score 11/29/23 2154 10     Pain Loc --      Pain Education --      Exclude from Growth Chart --     Most recent vital signs: Vitals:   11/29/23 2153  BP: 136/70  Pulse: 67  Resp: 19  Temp: 98 F (36.7 C)  SpO2: 100%    General: Awake, no distress.  CV:  Good peripheral perfusion.  Resp:  Normal effort.  Abd:  No distention.  Other:  Awake alert comfortable appearing with normal vital signs.  The affected ankle appears swollen but there is no bony point tenderness no warmth or erythema.  He is neurovascular intact and able to range with both dorsiflexion and plantarflexion.   ED Results / Procedures / Treatments   Labs (all labs ordered are listed, but only abnormal results are  displayed) Labs Reviewed - No data to display    RADIOLOGY I independently reviewed and interpreted ankle x-ray and I see no obvious fracture or dislocation I also reviewed radiologist's formal read.   PROCEDURES:  Critical Care performed: No  Procedures   MEDICATIONS ORDERED IN ED: Medications - No data to display   IMPRESSION / MDM / ASSESSMENT AND PLAN / ED COURSE  I reviewed the triage vital signs and the nursing notes.                                Patient's presentation is most consistent with acute presentation with potential threat to life or bodily function.  Differential diagnosis includes, but is not limited to, fracture or dislocation of the ankle, ligamentous injury, ankle sprain, considered but less likely inflammatory or septic arthritis   The patient is on the cardiac monitor to evaluate for evidence of arrhythmia and/or significant heart rate changes.  MDM:    No evidence of infection of the ankle, and after minor trauma had some  swelling and pain with negative x-rays.  Will provide support with Ace wrap or ankle brace, he is ambulatory and weightbearing, and can follow-up with podiatry for which have given him referral information.         FINAL CLINICAL IMPRESSION(S) / ED DIAGNOSES   Final diagnoses:  Acute right ankle pain     Rx / DC Orders   ED Discharge Orders     None        Note:  This document was prepared using Dragon voice recognition software and may include unintentional dictation errors.    Buell Carmin, MD 11/30/23 [phone]

## 2023-11-30 NOTE — ED Notes (Signed)
 [name], PCA Aid was made aware that [facility] intends to arrive in roughly 1 hour to transport pt back to [facility].

## 2023-11-30 NOTE — ED Notes (Signed)
 RN called [facility] at this time to update on patients treatment and discharge at [phone], but no response.

## 2023-11-30 NOTE — ED Notes (Signed)
 Called to [facility] Per RN Alphonzia @306  am/Transport to [facility].
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
## 2023-11-30 NOTE — ED Notes (Addendum)
 [facility] services at the bedside. Report given as well as discharge paperwork and med necessity transfer documentation. Pt ABCs intact. RR even and unlabored. Pt in NAD. Denies needs at this time. Pt transported back to [facility] at this time.

## 2023-11-30 NOTE — ED Notes (Signed)
 This RN spoke with [facility] and gave a report on the patients status. Staff made aware of pts clearance for discharge. [facility] does not have transport servicing at this time.

## 2023-12-17 ENCOUNTER — Emergency Department (EMERGENCY_DEPARTMENT_HOSPITAL)
Admission: EM | Admit: 2023-12-17 | Discharge: 2023-12-18 | Disposition: A | Discharge status: Other Acute Inpatient Hospital | Source: Home / Self Care | Attending: Emergency Medicine | Admitting: Emergency Medicine

## 2023-12-17 ENCOUNTER — Other Ambulatory Visit: Payer: Self-pay

## 2023-12-17 DIAGNOSIS — Z7982 Long term (current) use of aspirin: Secondary | ICD-10-CM | POA: Insufficient documentation

## 2023-12-17 DIAGNOSIS — Z87442 Personal history of urinary calculi: Secondary | ICD-10-CM | POA: Insufficient documentation

## 2023-12-17 DIAGNOSIS — E119 Type 2 diabetes mellitus without complications: Secondary | ICD-10-CM | POA: Insufficient documentation

## 2023-12-17 DIAGNOSIS — Z79899 Other long term (current) drug therapy: Secondary | ICD-10-CM | POA: Insufficient documentation

## 2023-12-17 DIAGNOSIS — I10 Essential (primary) hypertension: Secondary | ICD-10-CM | POA: Insufficient documentation

## 2023-12-17 DIAGNOSIS — F32A Depression, unspecified: Secondary | ICD-10-CM | POA: Insufficient documentation

## 2023-12-17 DIAGNOSIS — R440 Auditory hallucinations: Secondary | ICD-10-CM

## 2023-12-17 DIAGNOSIS — F209 Schizophrenia, unspecified: Secondary | ICD-10-CM | POA: Insufficient documentation

## 2023-12-17 DIAGNOSIS — R45851 Suicidal ideations: Secondary | ICD-10-CM

## 2023-12-17 LAB — CBC
HCT: 36.4 % — ABNORMAL LOW (ref 39.0–52.0)
Hemoglobin: 12 g/dL — ABNORMAL LOW (ref 13.0–17.0)
MCH: 28.8 pg (ref 26.0–34.0)
MCHC: 33 g/dL (ref 30.0–36.0)
MCV: 87.3 fL (ref 80.0–100.0)
Platelets: 184 10*3/uL (ref 150–400)
RBC: 4.17 MIL/uL — ABNORMAL LOW (ref 4.22–5.81)
RDW: 14.2 % (ref 11.5–15.5)
WBC: 6.7 10*3/uL (ref 4.0–10.5)
nRBC: 0 % (ref 0.0–0.2)

## 2023-12-17 LAB — COMPREHENSIVE METABOLIC PANEL WITH GFR
ALT: 13 U/L (ref 0–44)
AST: 18 U/L (ref 15–41)
Albumin: 4.4 g/dL (ref 3.5–5.0)
Alkaline Phosphatase: 99 U/L (ref 38–126)
Anion gap: 7 (ref 5–15)
BUN: 15 mg/dL (ref 6–20)
CO2: 21 mmol/L — ABNORMAL LOW (ref 22–32)
Calcium: 9.5 mg/dL (ref 8.9–10.3)
Chloride: 112 mmol/L — ABNORMAL HIGH (ref 98–111)
Creatinine, Ser: 1.13 mg/dL (ref 0.61–1.24)
GFR, Estimated: >60 mL/min (ref >60)
Glucose, Bld: 107 mg/dL — ABNORMAL HIGH (ref 70–99)
Potassium: 4 mmol/L (ref 3.5–5.1)
Sodium: 140 mmol/L (ref 135–145)
Total Bilirubin: 0.8 mg/dL (ref 0.0–1.2)
Total Protein: 7.1 g/dL (ref 6.5–8.1)

## 2023-12-17 LAB — URINE DRUG SCREEN, QUALITATIVE (ARMC ONLY)
Amphetamines, Ur Screen: NOT DETECTED
Barbiturates, Ur Screen: NOT DETECTED
Benzodiazepine, Ur Scrn: NOT DETECTED
Cannabinoid 50 Ng, Ur ~~LOC~~: NOT DETECTED
Cocaine Metabolite,Ur ~~LOC~~: NOT DETECTED
MDMA (Ecstasy)Ur Screen: NOT DETECTED
Methadone Scn, Ur: NOT DETECTED
Opiate, Ur Screen: NOT DETECTED
Phencyclidine (PCP) Ur S: NOT DETECTED
Tricyclic, Ur Screen: NOT DETECTED

## 2023-12-17 LAB — ETHANOL: Alcohol, Ethyl (B): <15 mg/dL (ref <15)

## 2023-12-17 NOTE — BH Assessment (Signed)
 Comprehensive Clinical Assessment (CCA) Screening, Triage and Referral Note  12/17/2023 Derrick Atkins [id number]  Chief Complaint:  Patient presents to the ER voluntarily due to worsening symptoms of depression and auditory hallucinations. He states he is hearing voices that are saying bad things about him and this have gone on for approximately two years. He currently denies SI/HI and V/H. He states he don't know what cause the change but says he does not want to return to the care facility or be alone.   During the interview, the patient was calm, cooperative and pleasant. He was able to provide appropriate answers to the questions.  He currently denies SI/HI and V/H.  Visit Diagnosis: Depression  Patient Reported Information How did you hear about us ? Self  What Is the Reason for Your Visit/Call Today? Depression  How Long Has This Been Causing You Problems? 1-6 months  What Do You Feel Would Help You the Most Today? Treatment for Depression or other mood problem; Social Support   Have You Recently Had Any Thoughts About Hurting Yourself? No  Are You Planning to Commit Suicide/Harm Yourself At This time? No   Have you Recently Had Thoughts About Hurting Someone Marigene Shoulder? No  Are You Planning to Harm Someone at This Time? No  Explanation: No data recorded  Have You Used Any Alcohol  or Drugs in the Past 24 Hours? No  How Long Ago Did You Use Drugs or Alcohol ? No data recorded What Did You Use and How Much? No data recorded  Do You Currently Have a Therapist/Psychiatrist? No  Name of Therapist/Psychiatrist: Via the care home   Have You Been Recently Discharged From Any Office Practice or Programs? No  Explanation of Discharge From Practice/Program: No data recorded   CCA Screening Triage Referral Assessment Type of Contact: Face-to-Face  Telemedicine Service Delivery:   Is this Initial or Reassessment?   Date Telepsych consult ordered in [facility]:    Time Telepsych consult  ordered in [facility]:    Location of Assessment: [facility] ED  Provider Location: [facility] ED    Collateral Involvement: None   Does Patient Have a Court Appointed Legal Guardian? No data recorded Name and Contact of Legal Guardian: No data recorded If Minor and Not Living with Parent(s), Who has Custody? No data recorded Is CPS involved or ever been involved? Never  Is APS involved or ever been involved? Never   Patient Determined To Be At Risk for Harm To Self or Others Based on Review of Patient Reported Information or Presenting Complaint? No  Method: No Plan  Availability of Means: No access or NA  Intent: Vague intent or NA  Notification Required: No need or identified person  Additional Information for Danger to Others Potential: No data recorded Additional Comments for Danger to Others Potential: No data recorded Are There Guns or Other Weapons in Your Home? No  Types of Guns/Weapons: No data recorded Are These Weapons Safely Secured?                            No  Who Could Verify You Are Able To Have These Secured: No data recorded Do You Have any Outstanding Charges, Pending Court Dates, Parole/Probation? None  Contacted To Inform of Risk of Harm To Self or Others: No data recorded  Does Patient Present under Involuntary Commitment? No    [place] of Residence: [place]   Patient Currently Receiving the Following Services: Group Home   Determination  of Need: Emergent (2 hours)   Options For Referral: Medication Management; Inpatient Hospitalization   Disposition Recommendation per psychiatric provider: There are no psychiatric contraindications to discharge at this time  Paris Hohn C Danikah Budzik, Counselor

## 2023-12-17 NOTE — ED Notes (Signed)
 Ambulated pt to the bathroom by wheelchair due to being unsteady with walking. Pt stated his ankle was hurting at this time. Notified nurse of this complaint at this time.

## 2023-12-17 NOTE — ED Triage Notes (Signed)
 BIB leo voluntary from [facility] for thoughts of self harm.  Per leo "he has been depressed because his family hasn't been to see him in awhile."  Per pt "I'm also hearing intense voices."  A&Ox4, NADN

## 2023-12-17 NOTE — ED Notes (Signed)
 Chaplain at bedside

## 2023-12-17 NOTE — ED Notes (Signed)
 Pt belongings in labeled bag, dressed out by this RN and NT Paige  1 gray tee shirt, 1 pair jeans, 1 pair gray shoes, 2 black socks, 1 brown wallet, 1 black belt, blue underwear

## 2023-12-17 NOTE — Progress Notes (Signed)
   12/17/23 2110  Spiritual Encounters  Type of Visit Initial  Care provided to: Patient  Referral source Nurse (RN/NT/LPN)  Reason for visit Routine spiritual support  OnCall Visit No   Chaplain responded to a call for support. I met with the patient, Derrick Atkins who shared some of his story and his concerns. Through listening and asking open ended questions Derrick Atkins considered some ideas about his uncomfortableness. Derrick Atkins could carry a conversation reflecting back to his childhood but struggles with the now. He referred to where he is he has no one to talk with, however he likes it better than were he was. He may be searching but he did not indicate that he was.  As I departed I encouraged him to try and rest.   [facility]  [phone]

## 2023-12-17 NOTE — ED Notes (Signed)
 Patient to ED with [facility] for voluntary psych eval.  Per Officer Doraine Gallon patient called them himself stating he was having thoughts of self harm.

## 2023-12-17 NOTE — ED Provider Notes (Signed)
   [facility] Provider Note    Event Date/Time   First MD Initiated Contact with Patient 12/17/23 2017     (approximate)   History   Psychiatric Evaluation   HPI  Derrick Atkins is a 53 y.o. male with a history of cerebral palsy, diabetes, hypertension, depression, and schizoaffective disorder who presents with  I reviewed the past medical records.  The patient was most recently evaluated by psychiatry on 4/24 after presenting due to a suicidal gesture.  He was diagnosed with adjustment disorder but was not recommended for inpatient admission at that time.   Physical Exam   Triage Vital Signs: ED Triage Vitals [12/17/23 1955]  Encounter Vitals Group     BP (!) 198/73     Systolic BP Percentile      Diastolic BP Percentile      Pulse Rate 80     Resp 16     Temp 98 F (36.7 C)     Temp Source Oral     SpO2 97 %     Weight 190 lb (86.2 kg)     Height 6\' 4"  (1.93 m)     Head Circumference      Peak Flow      Pain Score      Pain Loc      Pain Education      Exclude from Growth Chart     Most recent vital signs: Vitals:   12/17/23 1955  BP: (!) 198/73  Pulse: 80  Resp: 16  Temp: 98 F (36.7 C)  SpO2: 97%    {Only need to document appropriate and relevant physical exam:1} General: Awake, no distress. *** CV:  Good peripheral perfusion. *** Resp:  Normal effort. *** Abd:  No distention. *** Other:  ***   ED Results / Procedures / Treatments   Labs (all labs ordered are listed, but only abnormal results are displayed) Labs Reviewed  COMPREHENSIVE METABOLIC PANEL WITH GFR  ETHANOL  CBC  URINE DRUG SCREEN, QUALITATIVE ([facility] ONLY)     EKG  ***   RADIOLOGY *** {USE THE WORD "INTERPRETED"!! You MUST document your own interpretation of imaging, as well as the fact that you reviewed the radiologist's report!:1}   PROCEDURES:  Critical Care performed: {CriticalCareYesNo:19197::"Yes, see critical care procedure  note(s)","No"}  Procedures   MEDICATIONS ORDERED IN ED: Medications - No data to display   IMPRESSION / MDM / ASSESSMENT AND PLAN / ED COURSE  I reviewed the triage vital signs and the nursing notes.                              Differential diagnosis includes, but is not limited to, ***  Patient's presentation is most consistent with {EM COPA:27473}  *** {If the patient is on the monitor, remove the brackets and asterisks on the sentence below and remember to document it as a Procedure as well. Otherwise delete the sentence below:1} {**The patient is on the cardiac monitor to evaluate for evidence of arrhythmia and/or significant heart rate changes.     FINAL CLINICAL IMPRESSION(S) / ED DIAGNOSES   Final diagnoses:  None     Rx / DC Orders   ED Discharge Orders     None        Note:  This document was prepared using Dragon voice recognition software and may include unintentional dictation errors.

## 2023-12-17 NOTE — ED Notes (Signed)
 Provided pt with snack tray.

## 2023-12-18 ENCOUNTER — Inpatient Hospital Stay
Admission: AD | Admit: 2023-12-18 | Discharge: 2024-01-05 | DRG: 885 | Disposition: A | Discharge status: Home / Self Care | Source: Intra-hospital | Attending: Psychiatry | Admitting: Psychiatry

## 2023-12-18 ENCOUNTER — Encounter: Payer: Self-pay | Admitting: Psychiatric/Mental Health

## 2023-12-18 ENCOUNTER — Encounter: Payer: Self-pay | Admitting: Adult Health

## 2023-12-18 DIAGNOSIS — R634 Abnormal weight loss: Secondary | ICD-10-CM | POA: Diagnosis present

## 2023-12-18 DIAGNOSIS — F259 Schizoaffective disorder, unspecified: Principal | ICD-10-CM | POA: Diagnosis present

## 2023-12-18 DIAGNOSIS — G809 Cerebral palsy, unspecified: Secondary | ICD-10-CM | POA: Diagnosis present

## 2023-12-18 DIAGNOSIS — Z6821 Body mass index (BMI) 21.0-21.9, adult: Secondary | ICD-10-CM | POA: Diagnosis not present

## 2023-12-18 DIAGNOSIS — E119 Type 2 diabetes mellitus without complications: Secondary | ICD-10-CM | POA: Diagnosis present

## 2023-12-18 DIAGNOSIS — Z91040 Latex allergy status: Secondary | ICD-10-CM

## 2023-12-18 DIAGNOSIS — Z87442 Personal history of urinary calculi: Secondary | ICD-10-CM | POA: Diagnosis not present

## 2023-12-18 DIAGNOSIS — F4321 Adjustment disorder with depressed mood: Secondary | ICD-10-CM | POA: Diagnosis present

## 2023-12-18 DIAGNOSIS — M419 Scoliosis, unspecified: Secondary | ICD-10-CM | POA: Diagnosis present

## 2023-12-18 DIAGNOSIS — F419 Anxiety disorder, unspecified: Secondary | ICD-10-CM | POA: Diagnosis present

## 2023-12-18 DIAGNOSIS — Z599 Problem related to housing and economic circumstances, unspecified: Secondary | ICD-10-CM

## 2023-12-18 DIAGNOSIS — F200 Paranoid schizophrenia: Secondary | ICD-10-CM | POA: Diagnosis not present

## 2023-12-18 DIAGNOSIS — G4700 Insomnia, unspecified: Secondary | ICD-10-CM | POA: Diagnosis present

## 2023-12-18 DIAGNOSIS — Z7982 Long term (current) use of aspirin: Secondary | ICD-10-CM

## 2023-12-18 DIAGNOSIS — Z8249 Family history of ischemic heart disease and other diseases of the circulatory system: Secondary | ICD-10-CM

## 2023-12-18 DIAGNOSIS — Z9151 Personal history of suicidal behavior: Secondary | ICD-10-CM

## 2023-12-18 DIAGNOSIS — F339 Major depressive disorder, recurrent, unspecified: Secondary | ICD-10-CM | POA: Diagnosis present

## 2023-12-18 DIAGNOSIS — I10 Essential (primary) hypertension: Secondary | ICD-10-CM | POA: Diagnosis present

## 2023-12-18 DIAGNOSIS — G8929 Other chronic pain: Secondary | ICD-10-CM | POA: Diagnosis present

## 2023-12-18 DIAGNOSIS — Z888 Allergy status to other drugs, medicaments and biological substances status: Secondary | ICD-10-CM

## 2023-12-18 DIAGNOSIS — Z5982 Transportation insecurity: Secondary | ICD-10-CM

## 2023-12-18 DIAGNOSIS — F209 Schizophrenia, unspecified: Principal | ICD-10-CM | POA: Diagnosis present

## 2023-12-18 DIAGNOSIS — Z79890 Hormone replacement therapy (postmenopausal): Secondary | ICD-10-CM | POA: Diagnosis not present

## 2023-12-18 DIAGNOSIS — Z79899 Other long term (current) drug therapy: Secondary | ICD-10-CM

## 2023-12-18 DIAGNOSIS — R45851 Suicidal ideations: Secondary | ICD-10-CM | POA: Diagnosis present

## 2023-12-18 DIAGNOSIS — Z634 Disappearance and death of family member: Secondary | ICD-10-CM | POA: Diagnosis not present

## 2023-12-18 MED ORDER — VITAMIN B-12 1000 MCG PO TABS
1000.0000 ug | ORAL_TABLET | Freq: Every day | ORAL | Status: DC
Start: 2023-12-19 — End: 2024-01-05
  Administered 2023-12-19 – 2024-01-05 (×18): 1000 ug via ORAL
  Filled 2023-12-18 (×18): qty 1

## 2023-12-18 MED ORDER — PANTOPRAZOLE SODIUM 40 MG PO TBEC
40.0000 mg | DELAYED_RELEASE_TABLET | Freq: Every day | ORAL | Status: DC
  Administered 2023-12-19 – 2024-01-05 (×18): 40 mg via ORAL
  Filled 2023-12-18 (×18): qty 1

## 2023-12-18 MED ORDER — ALBUTEROL SULFATE HFA 108 (90 BASE) MCG/ACT IN AERS: 2.0000 | INHALATION_SPRAY | RESPIRATORY_TRACT | Status: DC | PRN

## 2023-12-18 MED ORDER — OLANZAPINE 10 MG IM SOLR: 10.0000 mg | Freq: Three times a day (TID) | INTRAMUSCULAR | Status: DC | PRN

## 2023-12-18 MED ORDER — VITAMIN D 25 MCG (1000 UNIT) PO TABS
1000.0000 [IU] | ORAL_TABLET | Freq: Every day | ORAL | Status: DC
  Administered 2023-12-18: 1000 [IU] via ORAL
  Filled 2023-12-18: qty 1

## 2023-12-18 MED ORDER — LEVOTHYROXINE SODIUM 88 MCG PO TABS: 88.0000 ug | ORAL_TABLET | Freq: Every day | ORAL | Status: DC

## 2023-12-18 MED ORDER — METOPROLOL TARTRATE 25 MG PO TABS
25.0000 mg | ORAL_TABLET | Freq: Two times a day (BID) | ORAL | Status: DC
  Filled 2023-12-18: qty 1

## 2023-12-18 MED ORDER — VITAMIN C 500 MG PO TABS
500.0000 mg | ORAL_TABLET | Freq: Every day | ORAL | Status: DC
  Administered 2023-12-18: 500 mg via ORAL
  Filled 2023-12-18: qty 1

## 2023-12-18 MED ORDER — VITAMIN C 500 MG PO TABS
500.0000 mg | ORAL_TABLET | Freq: Every day | ORAL | Status: DC
  Administered 2023-12-19 – 2024-01-05 (×18): 500 mg via ORAL
  Filled 2023-12-18 (×19): qty 1

## 2023-12-18 MED ORDER — OLANZAPINE 5 MG PO TBDP
5.0000 mg | ORAL_TABLET | Freq: Three times a day (TID) | ORAL | Status: DC | PRN
  Administered 2023-12-25 – 2023-12-31 (×3): 5 mg via ORAL
  Filled 2023-12-18 (×3): qty 1

## 2023-12-18 MED ORDER — LINACLOTIDE 145 MCG PO CAPS
290.0000 ug | ORAL_CAPSULE | Freq: Every day | ORAL | Status: DC
  Administered 2023-12-19 – 2024-01-05 (×18): 290 ug via ORAL
  Filled 2023-12-18 (×18): qty 2

## 2023-12-18 MED ORDER — BUSPIRONE HCL 5 MG PO TABS
15.0000 mg | ORAL_TABLET | Freq: Three times a day (TID) | ORAL | Status: DC
  Administered 2023-12-19 – 2024-01-05 (×52): 15 mg via ORAL
  Filled 2023-12-18 (×52): qty 3

## 2023-12-18 MED ORDER — SENNA 8.6 MG PO TABS
2.0000 | ORAL_TABLET | Freq: Every day | ORAL | Status: DC
  Administered 2023-12-19 – 2024-01-04 (×17): 17.2 mg via ORAL
  Filled 2023-12-18 (×18): qty 2

## 2023-12-18 MED ORDER — ROSUVASTATIN CALCIUM 5 MG PO TABS
5.0000 mg | ORAL_TABLET | Freq: Every day | ORAL | Status: DC
  Administered 2023-12-19 – 2024-01-05 (×17): 5 mg via ORAL
  Filled 2023-12-18 (×18): qty 1

## 2023-12-18 MED ORDER — FLUOXETINE HCL 20 MG PO CAPS
20.0000 mg | ORAL_CAPSULE | Freq: Every day | ORAL | Status: DC
  Administered 2023-12-19 – 2024-01-05 (×18): 20 mg via ORAL
  Filled 2023-12-18 (×18): qty 1

## 2023-12-18 MED ORDER — POTASSIUM CHLORIDE CRYS ER 20 MEQ PO TBCR
20.0000 meq | EXTENDED_RELEASE_TABLET | Freq: Every day | ORAL | Status: DC
Start: 2023-12-18 — End: 2023-12-18
  Administered 2023-12-18: 20 meq via ORAL
  Filled 2023-12-18: qty 1

## 2023-12-18 MED ORDER — POLYETHYLENE GLYCOL 3350 17 G PO PACK
17.0000 g | PACK | Freq: Every day | ORAL | Status: DC
Start: 2023-12-18 — End: 2023-12-18
  Filled 2023-12-18: qty 1

## 2023-12-18 MED ORDER — ALUM & MAG HYDROXIDE-SIMETH 200-200-20 MG/5ML PO SUSP
30.0000 mL | ORAL | Status: DC | PRN
  Administered 2024-01-04: 30 mL via ORAL
  Filled 2023-12-18: qty 30

## 2023-12-18 MED ORDER — FENOFIBRATE 54 MG PO TABS
54.0000 mg | ORAL_TABLET | Freq: Every day | ORAL | Status: DC
  Administered 2023-12-19 – 2024-01-05 (×18): 54 mg via ORAL
  Filled 2023-12-18 (×18): qty 1

## 2023-12-18 MED ORDER — ENSURE PLUS HIGH PROTEIN PO LIQD
237.0000 mL | Freq: Two times a day (BID) | ORAL | Status: DC
  Administered 2023-12-19 – 2023-12-20 (×4): 237 mL via ORAL

## 2023-12-18 MED ORDER — PANTOPRAZOLE SODIUM 40 MG PO TBEC: 40.0000 mg | DELAYED_RELEASE_TABLET | Freq: Every day | ORAL | Status: DC

## 2023-12-18 MED ORDER — VITAMIN D 25 MCG (1000 UNIT) PO TABS
1000.0000 [IU] | ORAL_TABLET | Freq: Every day | ORAL | Status: DC
Start: 2023-12-19 — End: 2024-01-05
  Administered 2023-12-19 – 2024-01-05 (×18): 1000 [IU] via ORAL
  Filled 2023-12-18 (×18): qty 1

## 2023-12-18 MED ORDER — OLANZAPINE 10 MG IM SOLR: 10.0000 mg | Freq: Once | INTRAMUSCULAR | Status: DC | PRN

## 2023-12-18 MED ORDER — GEMFIBROZIL 600 MG PO TABS
600.0000 mg | ORAL_TABLET | Freq: Two times a day (BID) | ORAL | Status: DC
  Administered 2023-12-18: 600 mg via ORAL
  Filled 2023-12-18: qty 1

## 2023-12-18 MED ORDER — HYDROXYZINE HCL 10 MG PO TABS
10.0000 mg | ORAL_TABLET | Freq: Every day | ORAL | Status: DC
  Administered 2023-12-19 – 2024-01-02 (×15): 10 mg via ORAL
  Filled 2023-12-18 (×15): qty 1

## 2023-12-18 MED ORDER — ASPIRIN 81 MG PO TBEC
81.0000 mg | DELAYED_RELEASE_TABLET | Freq: Every day | ORAL | Status: DC
  Administered 2023-12-18: 81 mg via ORAL
  Filled 2023-12-18: qty 1

## 2023-12-18 MED ORDER — GEMFIBROZIL 600 MG PO TABS: 600.0000 mg | ORAL_TABLET | Freq: Two times a day (BID) | ORAL | Status: DC

## 2023-12-18 MED ORDER — BENZTROPINE MESYLATE 1 MG PO TABS
1.0000 mg | ORAL_TABLET | Freq: Every day | ORAL | Status: DC
  Administered 2023-12-19 – 2024-01-05 (×18): 1 mg via ORAL
  Filled 2023-12-18 (×18): qty 1

## 2023-12-18 MED ORDER — FOLIC ACID 1 MG PO TABS
1.0000 mg | ORAL_TABLET | Freq: Every day | ORAL | Status: DC
  Administered 2023-12-19 – 2024-01-05 (×18): 1 mg via ORAL
  Filled 2023-12-18 (×18): qty 1

## 2023-12-18 MED ORDER — CYANOCOBALAMIN 500 MCG PO TABS
1000.0000 ug | ORAL_TABLET | Freq: Every day | ORAL | Status: DC
  Administered 2023-12-18: 1000 ug via ORAL
  Filled 2023-12-18: qty 2

## 2023-12-18 MED ORDER — POLYETHYLENE GLYCOL 3350 17 G PO PACK
17.0000 g | PACK | Freq: Every day | ORAL | Status: DC
  Administered 2023-12-19 – 2024-01-05 (×16): 17 g via ORAL
  Filled 2023-12-18 (×17): qty 1

## 2023-12-18 MED ORDER — ASPIRIN 81 MG PO TBEC
81.0000 mg | DELAYED_RELEASE_TABLET | Freq: Every day | ORAL | Status: DC
  Administered 2023-12-19 – 2024-01-05 (×18): 81 mg via ORAL
  Filled 2023-12-18 (×18): qty 1

## 2023-12-18 MED ORDER — ROSUVASTATIN CALCIUM 5 MG PO TABS
5.0000 mg | ORAL_TABLET | Freq: Every day | ORAL | Status: DC
  Administered 2023-12-18: 5 mg via ORAL
  Filled 2023-12-18: qty 1

## 2023-12-18 MED ORDER — BUSPIRONE HCL 5 MG PO TABS
15.0000 mg | ORAL_TABLET | Freq: Three times a day (TID) | ORAL | Status: DC
  Administered 2023-12-18: 15 mg via ORAL
  Filled 2023-12-18: qty 3

## 2023-12-18 MED ORDER — MAGNESIUM HYDROXIDE 400 MG/5ML PO SUSP: 30.0000 mL | Freq: Every day | ORAL | Status: DC | PRN

## 2023-12-18 MED ORDER — FENOFIBRATE 54 MG PO TABS
54.0000 mg | ORAL_TABLET | Freq: Every day | ORAL | Status: DC
  Administered 2023-12-18: 54 mg via ORAL
  Filled 2023-12-18: qty 1

## 2023-12-18 MED ORDER — HYDROXYZINE HCL 10 MG PO TABS
10.0000 mg | ORAL_TABLET | Freq: Every day | ORAL | Status: DC
  Administered 2023-12-18: 10 mg via ORAL
  Filled 2023-12-18: qty 1

## 2023-12-18 MED ORDER — FOLIC ACID 1 MG PO TABS
1.0000 mg | ORAL_TABLET | Freq: Every day | ORAL | Status: DC
  Administered 2023-12-18: 1 mg via ORAL
  Filled 2023-12-18: qty 1

## 2023-12-18 MED ORDER — OLANZAPINE 7.5 MG PO TABS
3.7500 mg | ORAL_TABLET | Freq: Every day | ORAL | Status: DC
  Filled 2023-12-18: qty 0.5

## 2023-12-18 MED ORDER — POLYSACCHARIDE IRON COMPLEX 150 MG PO CAPS
150.0000 mg | ORAL_CAPSULE | Freq: Every day | ORAL | Status: DC
  Administered 2023-12-18: 150 mg via ORAL
  Filled 2023-12-18: qty 1

## 2023-12-18 MED ORDER — ACETAMINOPHEN 500 MG PO TABS
1000.0000 mg | ORAL_TABLET | Freq: Four times a day (QID) | ORAL | Status: DC | PRN
  Administered 2023-12-20 – 2023-12-28 (×13): 1000 mg via ORAL
  Filled 2023-12-18 (×14): qty 2

## 2023-12-18 MED ORDER — HYDROXYZINE HCL 25 MG PO TABS
25.0000 mg | ORAL_TABLET | Freq: Three times a day (TID) | ORAL | Status: DC | PRN
  Administered 2023-12-21 – 2024-01-04 (×7): 25 mg via ORAL
  Filled 2023-12-18 (×7): qty 1

## 2023-12-18 MED ORDER — POTASSIUM CHLORIDE CRYS ER 20 MEQ PO TBCR
20.0000 meq | EXTENDED_RELEASE_TABLET | Freq: Every day | ORAL | Status: DC
Start: 2023-12-19 — End: 2024-01-05
  Administered 2023-12-19 – 2024-01-05 (×18): 20 meq via ORAL
  Filled 2023-12-18 (×18): qty 1

## 2023-12-18 MED ORDER — FLUOXETINE HCL 20 MG PO CAPS
20.0000 mg | ORAL_CAPSULE | Freq: Every day | ORAL | Status: DC
  Administered 2023-12-18: 20 mg via ORAL
  Filled 2023-12-18: qty 1

## 2023-12-18 MED ORDER — BENZTROPINE MESYLATE 1 MG PO TABS
1.0000 mg | ORAL_TABLET | Freq: Every day | ORAL | Status: DC
  Administered 2023-12-18: 1 mg via ORAL
  Filled 2023-12-18: qty 1

## 2023-12-18 MED ORDER — OLANZAPINE 10 MG IM SOLR: 5.0000 mg | Freq: Three times a day (TID) | INTRAMUSCULAR | Status: DC | PRN

## 2023-12-18 MED ORDER — OLANZAPINE 7.5 MG PO TABS
3.7500 mg | ORAL_TABLET | Freq: Every day | ORAL | Status: DC
  Filled 2023-12-18 (×2): qty 0.5

## 2023-12-18 MED ORDER — LAMOTRIGINE 25 MG PO TABS
25.0000 mg | ORAL_TABLET | Freq: Two times a day (BID) | ORAL | Status: DC
  Administered 2023-12-18: 25 mg via ORAL
  Filled 2023-12-18: qty 1

## 2023-12-18 MED ORDER — LINACLOTIDE 145 MCG PO CAPS
290.0000 ug | ORAL_CAPSULE | Freq: Every day | ORAL | Status: DC
Start: 2023-12-18 — End: 2023-12-18
  Administered 2023-12-18: 290 ug via ORAL
  Filled 2023-12-18: qty 1
  Filled 2023-12-18: qty 2

## 2023-12-18 MED ORDER — HYDROXYZINE HCL 25 MG PO TABS: 25.0000 mg | ORAL_TABLET | Freq: Three times a day (TID) | ORAL | Status: DC | PRN

## 2023-12-18 MED ORDER — LEVOTHYROXINE SODIUM 88 MCG PO TABS
88.0000 ug | ORAL_TABLET | Freq: Every day | ORAL | Status: DC
  Administered 2023-12-19 – 2024-01-05 (×17): 88 ug via ORAL
  Filled 2023-12-18 (×18): qty 1

## 2023-12-18 MED ORDER — POLYSACCHARIDE IRON COMPLEX 150 MG PO CAPS
150.0000 mg | ORAL_CAPSULE | Freq: Every day | ORAL | Status: DC
  Administered 2023-12-19 – 2024-01-05 (×18): 150 mg via ORAL
  Filled 2023-12-18 (×19): qty 1

## 2023-12-18 MED ORDER — LAMOTRIGINE 25 MG PO TABS
25.0000 mg | ORAL_TABLET | Freq: Two times a day (BID) | ORAL | Status: DC
  Administered 2023-12-19 (×2): 25 mg via ORAL
  Filled 2023-12-18 (×2): qty 1

## 2023-12-18 MED ORDER — SENNA 8.6 MG PO TABS: 2.0000 | ORAL_TABLET | Freq: Every day | ORAL | Status: DC

## 2023-12-18 MED ORDER — ACETAMINOPHEN 500 MG PO TABS
1000.0000 mg | ORAL_TABLET | Freq: Four times a day (QID) | ORAL | Status: DC | PRN
  Administered 2023-12-18 (×2): 1000 mg via ORAL
  Filled 2023-12-18 (×2): qty 2

## 2023-12-18 MED ORDER — METOPROLOL TARTRATE 25 MG PO TABS
25.0000 mg | ORAL_TABLET | Freq: Two times a day (BID) | ORAL | Status: DC
  Administered 2023-12-19 – 2024-01-05 (×35): 25 mg via ORAL
  Filled 2023-12-18 (×36): qty 1

## 2023-12-18 NOTE — ED Notes (Signed)
 Pt ambulatory to restroom with steady gait and no assistance at this time.

## 2023-12-18 NOTE — ED Notes (Signed)
 Pt given food tray.

## 2023-12-18 NOTE — ED Notes (Signed)
 Pt belongings sent with tech transporting pt to [facility]

## 2023-12-18 NOTE — ED Notes (Signed)
 A care taker (Roxanna) from [facility] called this RN for updates on pt. This RN informed her that the pt is still awaiting a psych consult. Roxanna asked for an update once consult has been done. Call back number for [facility] is [phone]

## 2023-12-18 NOTE — Group Note (Signed)
 Date:  12/18/2023 Time:  9:09 PM  Group Topic/Focus:  Wrap-Up Group:   The focus of this group is to help patients review their daily goal of treatment and discuss progress on daily workbooks.    Participation Level:  Did Not Attend  Derrick Atkins 12/18/2023, 9:09 PM

## 2023-12-18 NOTE — Plan of Care (Signed)
  Problem: Education: Goal: Knowledge of [facility] Education information/materials will improve Outcome: Not Progressing Goal: Emotional status will improve Outcome: Not Progressing Goal: Mental status will improve Outcome: Not Progressing Goal: Verbalization of understanding the information provided will improve Outcome: Not Progressing   Problem: Activity: Goal: Interest or engagement in activities will improve Outcome: Not Progressing Goal: Sleeping patterns will improve Outcome: Not Progressing   Problem: Coping: Goal: Ability to verbalize frustrations and anger appropriately will improve Outcome: Not Progressing Goal: Ability to demonstrate self-control will improve Outcome: Not Progressing   Problem: Health Behavior/Discharge Planning: Goal: Identification of resources available to assist in meeting health care needs will improve Outcome: Not Progressing Goal: Compliance with treatment plan for underlying cause of condition will improve Outcome: Not Progressing   Problem: Physical Regulation: Goal: Ability to maintain clinical measurements within normal limits will improve Outcome: Not Progressing   Problem: Safety: Goal: Periods of time without injury will increase Outcome: Not Progressing   Problem: Education: Goal: Utilization of techniques to improve thought processes will improve Outcome: Not Progressing Goal: Knowledge of the prescribed therapeutic regimen will improve Outcome: Not Progressing   Problem: Activity: Goal: Interest or engagement in leisure activities will improve Outcome: Not Progressing Goal: Imbalance in normal sleep/wake cycle will improve Outcome: Not Progressing   Problem: Coping: Goal: Coping ability will improve Outcome: Not Progressing Goal: Will verbalize feelings Outcome: Not Progressing   Problem: Health Behavior/Discharge Planning: Goal: Ability to make decisions will improve Outcome: Not Progressing Goal:  Compliance with therapeutic regimen will improve Outcome: Not Progressing   Problem: Role Relationship: Goal: Will demonstrate positive changes in social behaviors and relationships Outcome: Not Progressing   Problem: Safety: Goal: Ability to disclose and discuss suicidal ideas will improve Outcome: Not Progressing Goal: Ability to identify and utilize support systems that promote safety will improve Outcome: Not Progressing   Problem: Self-Concept: Goal: Will verbalize positive feelings about self Outcome: Not Progressing Goal: Level of anxiety will decrease Outcome: Not Progressing   Problem: Education: Goal: Ability to make informed decisions regarding treatment will improve Outcome: Not Progressing   Problem: Coping: Goal: Coping ability will improve Outcome: Not Progressing   Problem: Health Behavior/Discharge Planning: Goal: Identification of resources available to assist in meeting health care needs will improve Outcome: Not Progressing   Problem: Medication: Goal: Compliance with prescribed medication regimen will improve Outcome: Not Progressing   Problem: Self-Concept: Goal: Ability to disclose and discuss suicidal ideas will improve Outcome: Not Progressing Goal: Will verbalize positive feelings about self Outcome: Not Progressing Note: Patient is initiating therapy. Patient will work on increased adherence

## 2023-12-18 NOTE — Progress Notes (Signed)
   12/18/23 1800  Psych Admission Type (Psych Patients Only)  Admission Status Voluntary  Psychosocial Assessment  Patient Complaints Anxiety;Depression;Appetite increase;Panic attack;Sadness;Self-harm thoughts;Sleep disturbance  Eye Contact Fair  Facial Expression Flat  Affect Apathetic;Anxious  Speech Soft  Interaction Cautious  Motor Activity Slow;Unsteady  Appearance/Hygiene Disheveled  Behavior Characteristics Cooperative  Mood Depressed  Thought Process  Coherency WDL  Content WDL  Delusions None reported or observed  Perception WDL  Hallucination Auditory  Judgment Limited  Confusion None  Danger to Self  Current suicidal ideation? Passive  Self-Injurious Behavior No self-injurious ideation or behavior indicators observed or expressed   Agreement Not to Harm Self Yes  Description of Agreement verbal  Danger to Others  Danger to Others None reported or observed

## 2023-12-18 NOTE — Progress Notes (Signed)
   12/18/23 1800  Charting Type  Charting Type Admission  Safety Check Verification  Has the RN verified the 15 minute safety check completion? Yes  Neurological  Neuro (WDL) WDL  HEENT  HEENT (WDL) WDL  Respiratory  Respiratory (WDL) WDL  Cardiac  Cardiac (WDL) WDL  Vascular  Vascular (WDL) WDL  Integumentary  Integumentary (WDL) WDL  Staff Member Assisting with Skin Assessment on Admission Vansh Reckart RN  Braden Scale (Ages 8 and up)  Sensory Perceptions 4  Moisture 4  Activity 3  Mobility 3  Nutrition 2  Friction and Shear 3  Braden Scale Score 19  Musculoskeletal  Musculoskeletal (WDL) X  Musculoskeletal Details  RLE Limited movement  Right Ankle Limited movement;Swelling;Weakness;Injury/trauma  Left Ankle Limited movement;Swelling;Weakness;Injury/trauma  Gastrointestinal  Gastrointestinal (WDL) WDL (w)  GU Assessment  Genitourinary (WDL) WDL  Genitalia  Male Genitalia Intact  Neurological  Level of Consciousness Alert

## 2023-12-18 NOTE — Plan of Care (Signed)
   Problem: Education: Goal: Knowledge of [facility] [facility] Education information/materials will improve Outcome: Not Progressing Goal: Emotional status will improve Outcome: Not Progressing Goal: Mental status will improve Outcome: Not Progressing Goal: Verbalization of understanding the information provided will improve Outcome: Not Progressing

## 2023-12-18 NOTE — Final Consult Note (Signed)
 Per Kathryn Parish, [facility], patient accepted to [facility] BMU unit, [address] for today.   Accepting is Orvil Bland NP; Attending is Belvie Boyers MD. Dx MDD.

## 2023-12-18 NOTE — ED Notes (Signed)
 Pt LG, [place] [facility], calling to get update on pt. Update given by this RN

## 2023-12-18 NOTE — ED Notes (Signed)
 Provided pt with dinner tray and ginger ale at this time.

## 2023-12-18 NOTE — ED Notes (Signed)
 Pt taken to interview room for tele psych conference

## 2023-12-18 NOTE — Consult Note (Addendum)
 [facility] Consult Note  Patient Name: Derrick Atkins MRN: [id number] DOB: 03/06/1971 DATE OF Consult: 12/18/2023  PRIMARY PSYCHIATRIC DIAGNOSES  1.  Schizophrenia, chronic condition, acute exacerbation 2.  Suicidal Ideations   RECOMMENDATIONS  Recommendations: Medication recommendations:  -- Pharmacy to complete medication reconciliation of home regimen -- Start Hydroxyzine  25mg  po TID PRN for anxiety -- Olanzapine  10mg  PO/IM Q6H PRN for acute agitation  Non-Medication/therapeutic recommendations: -- Monitor QTC with baseline EKG. -- VOL admission, meets criteria for IVC if needed -- Inpatient hospitalization -- Safety Precautions  Is inpatient psychiatric hospitalization recommended for this patient? Yes (Explain why): Patient meets criteria for inpatient admission due to worsening depression, ongoing hallucinations, passive SI with intent, lack of outpatient treatment. Patient agreeable to VOL admission. He currently meets criteria for IVC if needed.   Follow-Up Telepsychiatry C/L services: We will sign off for now. Please re-consult our service if needed for any concerning changes in the patient's condition, discharge planning, or questions.  Communication: Treatment team members (and family members if applicable) who were involved in treatment/care discussions and planning, and with whom we spoke or engaged with via secure text/chat, include the following: Dr. Author Board and ED Team  Thank you for involving us  in the care of this patient. If you have any additional questions or concerns, please call [phone] and ask for me or the provider on-call.  TELEPSYCHIATRY ATTESTATION & CONSENT  As the provider for this telehealth consult, I attest that I verified the patient's identity using two separate identifiers, introduced myself to the patient, provided my credentials, disclosed my location, and performed this encounter via a HIPAA-compliant, real-time, face-to-face, two-way,  interactive audio and video platform and with the full consent and agreement of the patient (or guardian as applicable.)  Patient physical location: ED in [facility]. Telehealth provider physical location: home office in state of [place]   Video start time: 0846 [facility] Time) Video end time: 0903 [facility] Time)  IDENTIFYING DATA  Derrick Atkins is a 53 y.o. year-old male for whom a psychiatric consultation has been ordered by the primary provider. The patient was identified using two separate identifiers.  CHIEF COMPLAINT/REASON FOR CONSULT  Depression, SI, Hallucinations    HISTORY OF PRESENT ILLNESS (HPI)  Derrick Atkins is a 53yo male with past psychiatric history of Schizophrenia, MDD, Adjustment Disorder, who presented to the emergency department via [facility], voluntarily, with concerns for worsening depression, hallucinations, and self-harming thoughts.  Upon evaluation, patient is calm and cooperative, alert and oriented x4. Patient states he came to he hospital because he had been experiencing chronic pain along with "not doing well". Patient states he has been having trouble making friends at [facility] and is experiencing hallucinations. Patient endorse derogatory auditory hallucinations that put him down or say mean things about him. He reports hearing these voices daily, have been ongoing for the last 2 years since his mother passed away. Denies command type hallucinations. Patient also endorses seeing "visions" and the ability to read peoples minds. Patient starts talking about his father passing away in 2003 and then jumps to talking about his old high school yearbooks and how peers would make fun of him for his learning disabilities. Patient then shares he feels the whole world is against him.   Patient endorses unstable mood, has ups and down. Reports insomnia. Appetite intact. Endorsed passive SI with intent stating he feels very "hopeless". No specific  plan. No active SI/HI. Prior attempt.   Patient with tangential thought  process at times, flight of ideas.     PAST PSYCHIATRIC HISTORY  Patient psychiatric history of Schizophrenia, MDD, Adjustment Disorder Prior psychiatric hospitalizations: Yes Prior Suicide Attempts: Yes  Current Outpatient Treatment: None  Current Psychotropic Medications: Unable to recall certain medications he is currently taking.  Prior Medications: Abilify  Maintena, Cogentin , Buspar , Prozac, Haldol , Hydroxyzine , Lamictal , Olanzapine ,   Otherwise as per HPI above.  PAST MEDICAL HISTORY  Past Medical History:  Diagnosis Date   Cerebral palsy (HCC)    Depression    Diabetes mellitus without complication (HCC)    Hypertension    Kidney stones    Schizoaffective disorder (HCC)    Scoliosis      HOME MEDICATIONS  Facility Ordered Medications  Medication   acetaminophen  (TYLENOL ) tablet 1,000 mg   PTA Medications  Medication Sig   aspirin  EC 81 MG tablet Take 1 tablet (81 mg total) by mouth daily. Swallow whole.   OLANZapine  (ZYPREXA ) 20 MG tablet Take 1 tablet (20 mg total) by mouth at bedtime. (Patient taking differently: Take 20 mg by mouth at bedtime. (Take with 10mg  tablet to equal 30mg  total))   levothyroxine  (SYNTHROID ) 88 MCG tablet Take 1 tablet (88 mcg total) by mouth daily at 6 (six) AM.   benztropine  (COGENTIN ) 1 MG tablet Take 1 tablet (1 mg total) by mouth daily.   ABILIFY  MAINTENA 400 MG PRSY prefilled syringe Inject 400 mg into the muscle every 28 (twenty-eight) days.   polyethylene glycol (MIRALAX  / GLYCOLAX ) 17 g packet Take 17 g by mouth daily.   cholecalciferol  (VITAMIN D3) 25 MCG (1000 UNIT) tablet Take 1,000 Units by mouth daily.   OLANZapine  (ZYPREXA ) 10 MG tablet Take 10 mg by mouth at bedtime. (Take with 20mg  tablet to equal 30mg  total) (Patient not taking: Reported on 11/03/2023)   folic acid  (FOLVITE ) 1 MG tablet Take 1 tablet (1 mg total) by mouth daily.   iron  polysaccharides  (NIFEREX) 150 MG capsule Take 1 capsule (150 mg total) by mouth daily.   ascorbic acid  (VITAMIN C ) 500 MG tablet Take 1 tablet (500 mg total) by mouth daily.   senna (SENOKOT) 8.6 MG TABS tablet Take 2 tablets by mouth at bedtime.   acetaminophen  (TYLENOL ) 500 MG tablet Take 1,000 mg by mouth 3 (three) times daily as needed for mild pain or moderate pain.   fenofibrate  54 MG tablet Take 54 mg by mouth daily.   rosuvastatin  (CRESTOR ) 5 MG tablet Take 5 mg by mouth daily.   gemfibrozil  (LOPID ) 600 MG tablet Take 600 mg by mouth 2 (two) times daily before a meal.   VENTOLIN  HFA 108 (90 Base) MCG/ACT inhaler Inhale into the lungs every 4 (four) hours as needed.   loperamide  (IMODIUM ) 2 MG capsule Take 4 mg by mouth 2 (two) times daily.   potassium chloride  SA (KLOR-CON  M) 20 MEQ tablet Take 20 mEq by mouth daily.   metoprolol  tartrate (LOPRESSOR ) 25 MG tablet Take 1 tablet (25 mg total) by mouth 2 (two) times daily.   lamoTRIgine  (LAMICTAL ) 25 MG tablet Take 25 mg by mouth 2 (two) times daily.   pantoprazole  (PROTONIX ) 40 MG tablet Take 1 tablet (40 mg total) by mouth daily before breakfast.   linaclotide (LINZESS) 145 MCG CAPS capsule Take 145 mcg by mouth daily before breakfast.   hydrOXYzine  (ATARAX ) 10 MG tablet Take 10 mg by mouth daily. At noon   FLUoxetine (PROZAC) 20 MG capsule Take 20 mg by mouth daily.   OLANZapine  (ZYPREXA ) 5 MG  tablet Add to your existing 20 mg dose of olanzapine  for nightly dose of 25 mg total (Patient not taking: Reported on 11/03/2023)   busPIRone  (BUSPAR ) 15 MG tablet Take 15 mg by mouth 2 (two) times daily.   haloperidol  (HALDOL ) 5 MG tablet Take 5 mg by mouth 2 (two) times daily.   OLANZapine  (ZYPREXA ) 7.5 MG tablet Take 3.75 mg by mouth at bedtime.     ALLERGIES  Allergies  Allergen Reactions   Valproic Acid  And Related Other (See Comments)    Hyperammonemic encephalopathy   Phenytoin Sodium Extended Other (See Comments) and Nausea And Vomiting   Prednisone  Hives and Nausea And Vomiting   Latex Hives, Nausea And Vomiting and Rash    SOCIAL & SUBSTANCE USE HISTORY  Social History   Socioeconomic History   Marital status: Single    Spouse name: Not on file   Number of children: Not on file   Years of education: Not on file   Highest education level: Not on file  Occupational History   Not on file  Tobacco Use   Smoking status: Never    Passive exposure: Never   Smokeless tobacco: Never  Vaping Use   Vaping status: Never Used  Substance and Sexual Activity   Alcohol  use: No   Drug use: No   Sexual activity: Not Currently  Other Topics Concern   Not on file  Social History Narrative   Not on file   Social Drivers of Health   Financial Resource Strain: Not on file  Food Insecurity: No Food Insecurity (05/25/2023)   Hunger Vital Sign    Worried About Running Out of Food in the Last Year: Never true    Ran Out of Food in the Last Year: Never true  Transportation Needs: No Transportation Needs (05/25/2023)   PRAPARE - Administrator, Civil Service (Medical): No    Lack of Transportation (Non-Medical): No  Recent Concern: Transportation Needs - Unmet Transportation Needs (04/14/2023)   PRAPARE - Administrator, Civil Service (Medical): Yes    Lack of Transportation (Non-Medical): Yes  Physical Activity: Not on file  Stress: Not on file  Social Connections: Not on file   Social History   Tobacco Use  Smoking Status Never   Passive exposure: Never  Smokeless Tobacco Never   Social History   Substance and Sexual Activity  Alcohol  Use No   Social History   Substance and Sexual Activity  Drug Use No    Additional pertinent information: Publishing rights manager   FAMILY HISTORY  Family History  Problem Relation Age of Onset   Heart disease Father    Heart attack Father    Family Psychiatric History (if known):  None reported at this time   MENTAL STATUS EXAM (MSE)  Mental Status Exam: General  Appearance: Unkept, hospital scrubs  Orientation:  Full (Time, Place, and Person)  Memory:  Immediate;   Good Recent;   Good  Concentration:  Concentration: Fair  Recall:  Fair  Attention  Good  Eye Contact:  Good  Speech:  Clear and Coherent  Language:  Good  Volume:  Decreased  Mood: Depressed   Affect:  Congruent  Thought Process:  Disorganized  Thought Content:  Hallucinations: Auditory Visual, Paranoid Ideation, and Tangential  Suicidal Thoughts:  Passive SI without plan  Homicidal Thoughts:  No  Judgement:  Impaired  Insight:  Fair  Psychomotor Activity:  Normal  Akathisia:  No  Fund of Knowledge:  Fair    Assets:  Manufacturing systems engineer Desire for Improvement Housing Physical Health  Cognition:  WNL  ADL's:  Intact  AIMS (if indicated):       VITALS  Blood pressure 135/72, pulse 66, temperature 98 F (36.7 C), temperature source Oral, resp. rate 18, height 6\' 4"  (1.93 m), weight 86.2 kg, SpO2 100%.  LABS  Admission on 12/17/2023  Component Date Value Ref Range Status   Sodium 12/17/2023 140  135 - 145 mmol/L Final   Potassium 12/17/2023 4.0  3.5 - 5.1 mmol/L Final   Chloride 12/17/2023 112 (H)  98 - 111 mmol/L Final   CO2 12/17/2023 21 (L)  22 - 32 mmol/L Final   Glucose, Bld 12/17/2023 107 (H)  70 - 99 mg/dL Final   Glucose reference range applies only to samples taken after fasting for at least 8 hours.   BUN 12/17/2023 15  6 - 20 mg/dL Final   Creatinine, Ser 12/17/2023 1.13  0.61 - 1.24 mg/dL Final   Calcium  12/17/2023 9.5  8.9 - 10.3 mg/dL Final   Total Protein 44/07/270 7.1  6.5 - 8.1 g/dL Final   Albumin  12/17/2023 4.4  3.5 - 5.0 g/dL Final   AST 53/66/4403 18  15 - 41 U/L Final   ALT 12/17/2023 13  0 - 44 U/L Final   Alkaline Phosphatase 12/17/2023 99  38 - 126 U/L Final   Total Bilirubin 12/17/2023 0.8  0.0 - 1.2 mg/dL Final   GFR, Estimated 12/17/2023 >60  >60 mL/min Final   Comment: (NOTE) Calculated using the CKD-EPI Creatinine Equation  (2021)    Anion gap 12/17/2023 7  5 - 15 Final   Performed at [facility], [address] [place], [place] [address]   Alcohol , Ethyl (B) 12/17/2023 <15  <15 mg/dL Final   Comment: (NOTE) For medical purposes only. Performed at [facility], [address] [place], [place] [address]    WBC 12/17/2023 6.7  4.0 - 10.5 K/uL Final   RBC 12/17/2023 4.17 (L)  4.22 - 5.81 MIL/uL Final   Hemoglobin 12/17/2023 12.0 (L)  13.0 - 17.0 g/dL Final   HCT 75/64/3329 36.4 (L)  39.0 - 52.0 % Final   MCV 12/17/2023 87.3  80.0 - 100.0 fL Final   MCH 12/17/2023 28.8  26.0 - 34.0 pg Final   MCHC 12/17/2023 33.0  30.0 - 36.0 g/dL Final   RDW 51/88/4166 14.2  11.5 - 15.5 % Final   Platelets 12/17/2023 184  150 - 400 K/uL Final   nRBC 12/17/2023 0.0  0.0 - 0.2 % Final   Performed at [facility], [address] [place], [place] [address]   Tricyclic, Ur Screen 12/17/2023 NONE DETECTED  NONE DETECTED Final   Amphetamines, Ur Screen 12/17/2023 NONE DETECTED  NONE DETECTED Final   MDMA (Ecstasy)Ur Screen 12/17/2023 NONE DETECTED  NONE DETECTED Final   Cocaine Metabolite,Ur [place] 12/17/2023 NONE DETECTED  NONE DETECTED Final   Opiate, Ur Screen 12/17/2023 NONE DETECTED  NONE DETECTED Final   Phencyclidine (PCP) Ur S 12/17/2023 NONE DETECTED  NONE DETECTED Final   Cannabinoid 50 Ng, Ur [place] 12/17/2023 NONE DETECTED  NONE DETECTED Final   Barbiturates, Ur Screen 12/17/2023 NONE DETECTED  NONE DETECTED Final   Benzodiazepine, Ur Scrn 12/17/2023 NONE DETECTED  NONE DETECTED Final   Methadone Scn, Ur 12/17/2023 NONE DETECTED  NONE DETECTED Final   Comment: (NOTE) Tricyclics + metabolites, urine    Cutoff 1000 ng/mL Amphetamines + metabolites, urine  Cutoff 1000 ng/mL MDMA (Ecstasy), urine              Cutoff 500 ng/mL Cocaine Metabolite, urine          Cutoff 300 ng/mL Opiate + metabolites, urine        Cutoff 300 ng/mL Phencyclidine (PCP), urine         Cutoff 25 ng/mL Cannabinoid,  urine                 Cutoff 50 ng/mL Barbiturates + metabolites, urine  Cutoff 200 ng/mL Benzodiazepine, urine              Cutoff 200 ng/mL Methadone, urine                   Cutoff 300 ng/mL  The urine drug screen provides only a preliminary, unconfirmed analytical test result and should not be used for non-medical purposes. Clinical consideration and professional judgment should be applied to any positive drug screen result due to possible interfering substances. A more specific alternate chemical method must be used in order to obtain a confirmed analytical result. Gas chromatography / mass spectrometry (GC/MS) is the preferred confirm                          atory method. Performed at [facility], [address] [place], [place] [address]     PSYCHIATRIC REVIEW OF SYSTEMS (ROS)  ROS: Notable for the following relevant positive findings: Review of Systems  Psychiatric/Behavioral:  Positive for depression, hallucinations and suicidal ideas. Negative for substance abuse. The patient has insomnia.     Additional findings:      Musculoskeletal: No abnormal movements observed      Gait & Station: Laying/Sitting      Pain Screening: Present - mild to moderate      Nutrition & Dental Concerns: No concerns at this time  RISK FORMULATION/ASSESSMENT  Is the patient experiencing any suicidal or homicidal ideations: Yes       Explain if yes: Passive SI without plan  Protective factors considered for safety management: Access to care, housing, social support  Risk factors/concerns considered for safety management: acute psychosis  Prior attempt Depression Physical illness/chronic pain Hopelessness Male gender Unmarried  Is there a Astronomer plan with the patient and treatment team to minimize risk factors and promote protective factors: Yes           Explain: currently in the ED, safety precautions, inpatient hospitalization  Is crisis care placement or  psychiatric hospitalization recommended: Yes     Based on my current evaluation and risk assessment, patient is determined at this time to be at:  High risk  *RISK ASSESSMENT Risk assessment is a dynamic process; it is possible that this patient's condition, and risk level, may change. This should be re-evaluated and managed over time as appropriate. Please re-consult psychiatric consult services if additional assistance is needed in terms of risk assessment and management. If your team decides to discharge this patient, please advise the patient how to best access emergency psychiatric services, or to call 911, if their condition worsens or they feel unsafe in any way.   Corynne Scibilia E Yashar Inclan, NP Telepsychiatry Consult Services

## 2023-12-18 NOTE — ED Notes (Signed)
 Attempted to call report at [facility] but no answer at this time

## 2023-12-18 NOTE — ED Notes (Signed)
Pt provided with breakfast tray and beverage

## 2023-12-18 NOTE — ED Provider Notes (Signed)
 Emergency Medicine Observation Re-evaluation Note  JULIAN MEDINA is a 53 y.o. male, seen on rounds today.  Pt initially presented to the ED for complaints of Psychiatric Evaluation and Depression  Currently, the patient is is no acute distress. Denies any concerns at this time.  Physical Exam  Blood pressure 120/61, pulse 72, temperature 98 F (36.7 C), temperature source Oral, resp. rate 17, height 6\' 4"  (1.93 m), weight 86.2 kg, SpO2 97%.  Physical Exam: General: No apparent distress Pulm: Normal WOB Neuro: Moving all extremities Psych: Resting comfortably     ED Course / MDM     I have reviewed the labs performed to date as well as medications administered while in observation.  Recent changes in the last 24 hours include: No acute events overnight.  Plan   Current plan: Patient awaiting psychiatric disposition.    Kalena Mander, Clover Dao, DO 12/18/23 [phone]

## 2023-12-19 DIAGNOSIS — F209 Schizophrenia, unspecified: Secondary | ICD-10-CM

## 2023-12-19 MED ORDER — ADULT MULTIVITAMIN W/MINERALS CH
1.0000 | ORAL_TABLET | Freq: Every day | ORAL | Status: DC
  Administered 2023-12-19 – 2024-01-05 (×18): 1 via ORAL
  Filled 2023-12-19 (×18): qty 1

## 2023-12-19 MED ORDER — OLANZAPINE 10 MG PO TABS
10.0000 mg | ORAL_TABLET | Freq: Every day | ORAL | Status: DC
  Administered 2023-12-19 – 2023-12-20 (×2): 10 mg via ORAL
  Filled 2023-12-19 (×2): qty 1

## 2023-12-19 NOTE — [facility] Suicide Risk Assessment (Signed)
 [facility] INPATIENT:  Family/Significant Other Suicide Prevention Education  Suicide Prevention Education:  Education Completed; Coralee Derby, guardian [facility], [phone],  (name of family member/significant other) has been identified by the patient as the family member/significant other with whom the patient will be residing, and identified as the person(s) who will aid the patient in the event of a mental health crisis (suicidal ideations/suicide attempt).  With written consent from the patient, the family member/significant other has been provided the following suicide prevention education, prior to the and/or following the discharge of the patient.  The suicide prevention education provided includes the following: Suicide risk factors Suicide prevention and interventions National Suicide Hotline telephone number [facility] assessment telephone number [facility] Emergency Assistance 911 [facility] and/or Residential Mobile Crisis Unit telephone number  Request made of family/significant other to: Remove weapons (e.g., guns, rifles, knives), all items previously/currently identified as safety concern.   Remove drugs/medications (over-the-counter, prescriptions, illicit drugs), all items previously/currently identified as a safety concern.  The family member/significant other verbalizes understanding of the suicide prevention education information provided.  The family member/significant other agrees to remove the items of safety concern listed above.  Derrick Atkins 12/19/2023, 11:49 AM
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
## 2023-12-19 NOTE — BHH Counselor (Addendum)
 CSW spoke with Coralee Derby, guardian [facility], [phone].  She reports that Amey Ka is primary social worker, [phone].  Jenette Mitchell reports that patient has been depressed "at the facility and they would know more".  She reports that that patient "doesn't have any visitors and isn't happy".  She reports that patient has been "coming back and forth to the hospital"  CSW attempted to call Fredrik Jensen and let HIPAA compliant voicemail.  Shasta Deist, MSW, LCSW 12/19/2023 11:53 AM

## 2023-12-19 NOTE — Group Note (Signed)
 Recreation Therapy Group Note   Group Topic:Healthy Support Systems  Group Date: 12/19/2023 Start Time: 1040 End Time: 1130 Facilitators: Deatrice Factor, LRT, CTRS Location: Craft Room  Group Description: Straw Bridge. In groups or individually, patients were given 10 plastic drinking straws and an equal length of masking tape. Using the materials provided, patients were instructed to build a free-standing bridge-like structure to suspend an everyday item (ex: deck of cards) off the floor or table surface. All materials were required to be used in Secondary school teacher. LRT facilitated post-activity discussion reviewing the importance of having strong and healthy support systems in our lives. LRT discussed how the people in our lives serve as the tape and the deck of cards we placed on top of our straw structure are the stressors we face in daily life. LRT and pts discussed what happens in our life when things get too heavy for us , and we don't have strong supports outside of the hospital. Pt shared 2 of their healthy supports in their life aloud in the group.   Goal Area(s) Addressed:  Patient will identify 2 healthy supports in their life. Patient will identify skills to successfully complete activity. Patient will identify correlation of this activity to life post-discharge.  Patient will build on frustration tolerance skills. Patient will increase team building and communication skills.    Affect/Mood: Flat   Participation Level: Minimal    Clinical Observations/Individualized Feedback: Ryu was minimally active in their participation of session activities and group discussion. Pt identified "me, myself, and I" as his healthy supports. Pt shared he didn't have any coping skills that served as support. Pt did not partake in making a straw structure. Pt minimally interacted with LRT and peers while in group.    Plan: Continue to engage patient in RT group sessions 2-3x/week.   Deatrice Factor, LRT, CTRS 12/19/2023 1:10 PM

## 2023-12-19 NOTE — Plan of Care (Signed)
   Problem: Education: Goal: Emotional status will improve Outcome: Not Progressing Goal: Mental status will improve Outcome: Not Progressing Goal: Verbalization of understanding the information provided will improve Outcome: Not Progressing

## 2023-12-19 NOTE — [facility] Counselor (Signed)
 CSW attempted to reach the patient's guardian.  CSW left HIPAA compliant voicemail.  Shasta Deist, MSW, LCSW 12/19/2023 11:47 AM

## 2023-12-19 NOTE — Progress Notes (Signed)
 This Clinical research associate attempted to call patient's legal guardian, Derrick Atkins, to obtain verbal consent for treatment. However, there was no answer. This Clinical research associate left a HIPAA compliant voicemail for her to give the [facility] a call back at her earliest convenience.

## 2023-12-19 NOTE — Plan of Care (Signed)

## 2023-12-19 NOTE — Progress Notes (Signed)
 This Clinical research associate just spoke with Amparo Balk, patient guardian, and received verbal consent for Voluntary admission onto the unit.

## 2023-12-19 NOTE — Group Note (Signed)
 Date:  12/19/2023 Time:  5:19 PM  Group Topic/Focus:  Activity Group: The main focus of the group is to promote activity for the patients and encourage them to go outside to the courtyard and get some fresh air and some exercise.    Participation Level:  Active  Participation Quality:  Appropriate  Affect:  Appropriate  Cognitive:  Appropriate  Insight: Appropriate  Engagement in Group:  Engaged  Modes of Intervention:  Activity  Additional Comments:    Derrick Atkins [name] 12/19/2023, 5:19 PM

## 2023-12-19 NOTE — Group Note (Signed)
 Date:  12/19/2023 Time:  8:57 PM  Group Topic/Focus:  Coping With Mental Health Crisis:   The purpose of this group is to help patients identify strategies for coping with mental health crisis.  Group discusses possible causes of crisis and ways to manage them effectively. Self Care:   The focus of this group is to help patients understand the importance of self-care in order to improve or restore emotional, physical, spiritual, interpersonal, and financial health.    Participation Level:  None  Participation Quality:  Resistant  Affect:  Flat  Cognitive:  Lacking  Insight: None  Engagement in Group:  None  Modes of Intervention:  Discussion and Support  Additional Comments:  Patient arrived late to group and chose to remain outside (in hallway) for the entirety of group.    Armonee Bojanowski L 12/19/2023, 8:57 PM

## 2023-12-19 NOTE — Group Note (Signed)
 Date:  12/19/2023 Time:  11:44 AM  Group Topic/Focus:  Wellness Toolbox:   The focus of this group is to discuss various aspects of wellness, balancing those aspects and exploring ways to increase the ability to experience wellness.  Patients will create a wellness toolbox for use upon discharge.    Participation Level:  Did Not Attend   [name] Almadelia Looman 12/19/2023, 11:44 AM

## 2023-12-19 NOTE — Plan of Care (Signed)
  Problem: Education: Goal: Emotional status will improve Outcome: Progressing   Problem: Education: Goal: Mental status will improve Outcome: Progressing   Problem: Education: Goal: Verbalization of understanding the information provided will improve Outcome: Progressing   

## 2023-12-19 NOTE — Progress Notes (Signed)
 NUTRITION ASSESSMENT  Pt identified as at risk on the Malnutrition Screen Tool  INTERVENTION:  -Continue regular diet -MVI with minerals daily -Ensure Plus High Protein po BID, each supplement provides 350 kcal and 20 grams of protein.   NUTRITION DIAGNOSIS: Unintentional weight loss related to sub-optimal intake as evidenced by pt report.   Goal: Pt to meet >/= 90% of their estimated nutrition needs.  Monitor:  PO intake  Assessment:   Pt with past psychiatric history of Schizophrenia, MDD, Adjustment Disorder, who presented with concerns for worsening depression, hallucinations, and self-harming thoughts.   Pt currently on a regular diet. No meal completion data available to assess at this time.   Reviewed wt hx; pt has experienced a 8.2% wt loss over the past 7 months, which is not significant for time frame.   Medications reviewed and include vitamin C , buspar , vitamin D3, vitamin B-12, folic acid , protonix , potassium chloride , and senokot.   Labs reviewed.   53 y.o. male  Height: Ht Readings from Last 1 Encounters:  12/18/23 6\' 4"  (1.93 m)    Weight: Wt Readings from Last 1 Encounters:  12/18/23 80.7 kg    Weight Hx: Wt Readings from Last 10 Encounters:  12/18/23 80.7 kg  12/17/23 86.2 kg  11/29/23 79 kg  11/03/23 79 kg  10/26/23 79.4 kg  08/29/23 78.9 kg  08/27/23 79 kg  08/26/23 79.4 kg  05/25/23 88 kg  05/24/23 88.5 kg    BMI:  Body mass index is 21.67 kg/m. BMI WDL.   Estimated Nutritional Needs: Kcal: 25-30 kcal/kg Protein: > 1 gram protein/kg Fluid: 1 ml/kcal  Diet Order:  Diet Order             Diet regular Room service appropriate? Yes; Fluid consistency: Thin  Diet effective now                  Pt is also offered choice of unit snacks mid-morning and mid-afternoon.  Pt is eating as desired.   Lab results and medications reviewed.   Herschel Lords, RD, LDN, CDCES Registered Dietitian III Certified Diabetes Care and  Education Specialist If unable to reach this RD, please use "RD Inpatient" group chat on secure chat between hours of 8am-4 pm daily

## 2023-12-19 NOTE — H&P (Addendum)
 Psychiatric Admission Assessment Adult  Patient Identification: Derrick Atkins MRN:  [id number] Date of Evaluation:  12/19/2023 Chief Complaint:  Schizophrenia (HCC) [F20.9]   History of Present Illness: Derrick Atkins is a 53yo male with a past psychiatric history of schizophrenia, major depressive disorder, adjustment disorder who presented to the emergency department via law enforcement voluntarily with concerns of worsening depression, hallucinations and thoughts to self-harm.  Per chart review patient voluntarily reported to the hospital with report of chronic pain and difficulty making friends at [facility] as well as experiencing auditory hallucinations that put him down.  He reported hearing these voices daily and has been ongoing for the last 2 years since his mother passed away he also endorsed visions and inability to read people's minds  Patient seen today for initial psychiatric evaluation.  Following introductions and education related to the purpose of this interview we reviewed the events leading to this admission to where patient reports that he has been feeling hopeless.  He has been isolating to self and withdrawal when he as he is feeling uncomfortable at current residence of [place] [facility].  He reports very poor sleep and has been losing weight.  He reports there is discord on the home between he and peers.  Patient reports he has been compliant with current medications however he is unable to produce medication list.  We reviewed historical medications reported to include Haldol  as well as Zyprexa  however he remains unsure as to what current medications are.  He reports he has chronic auditory hallucinations that have been worsening in the context of chronic stress.  He reports related to mood decompensation and increase in auditory hallucinations he has had suicidal thoughts of not wanting to live.  When asked if he has had a plan or intent he states I do not know.  He  reports most recently tried to suffocate self with pillow.   Total Time spent with patient: 45 minutes Sleep  Sleep:No data recorded Past Psychiatric History: Patient reports this is the second admission to [facility] for behavioral health, reports many past longtime inpatient admissions including [facility], reports history of at support, reports most prevalent diagnoses schizoaffective disorder, he is unable to provide current medication list, he reports past suicidal attempts including most recent I tried to suffocate myself with a pillow , he has history of psychosis endorsing current symptoms of hallucinations Previous Med Trials: Abilify  Maintena, Cogentin , Buspar , Prozac , Haldol , Hydroxyzine , Lamictal , Olanzapine     Social History:  Born in [place] , raised by mother and father, has 1 brother who is 8 years younger, parents are now deceased, never married, no children, high school graduation certificate, he currently has a guardian  Substance History Patient denies history of substance abuse denies alcohol  use, tobacco use, denies abuse of illegal as well as illegal substances Is the patient at risk to self? Yes.    Has the patient been a risk to self in the past 6 months? Yes.    Has the patient been a risk to self within the distant past? Yes.    Is the patient a risk to others? Yes.    Has the patient been a risk to others in the past 6 months? Yes.    Has the patient been a risk to others within the distant past? Yes.     [place] Scale:  Flowsheet Row Admission (Current) from 12/18/2023 in [facility] INPATIENT BEH[facility]AL MEDICINE ED from 12/17/2023 in [facility] Emergency Department at  [facility] from 11/30/2023 in [facility] Emergency Department at [facility]  C-SSRS RISK CATEGORY Low Risk  [Simultaneous filing. User may not have seen previous data.] High Risk No Risk        Past Medical History:  Past Medical History:   Diagnosis Date   Cerebral palsy (HCC)    Depression    Diabetes mellitus without complication (HCC)    Hypertension    Kidney stones    Schizoaffective disorder (HCC)    Scoliosis     Past Surgical History:  Procedure Laterality Date   BOWEL RESECTION     CATARACT EXTRACTION W/PHACO Right 09/15/2022   Procedure: CATARACT EXTRACTION PHACO AND INTRAOCULAR LENS PLACEMENT (IOC) RIGHT DIABETIC VISION BLUE [place] 5  15.79  01:31.9;  Surgeon: Mittie Gaskin, MD;  Location: [facility];  Service: Ophthalmology;  Laterality: Right;  Latex Diabetic   INCISION AND DRAINAGE ABSCESS Left 12/20/2022   Procedure: INCISION AND DRAINAGE ABSCESS;  Surgeon: Lane Shope, MD;  Location: [facility];  Service: General;  Laterality: Left;   LAPAROTOMY N/A 05/15/2020   Procedure: EXPLORATORY LAPAROTOMY;  Surgeon: Desiderio Schanz, MD;  Location: [facility];  Service: General;  Laterality: N/A;   Family History:  Family History  Problem Relation Age of Onset   Heart disease Father    Heart attack Father     Social History:  Social History   Substance and Sexual Activity  Alcohol  Use No     Social History   Substance and Sexual Activity  Drug Use No      Allergies:   Allergies  Allergen Reactions   Valproic Acid  And Related Other (See Comments)    Hyperammonemic encephalopathy   Phenytoin Sodium Extended Other (See Comments) and Nausea And Vomiting   Prednisone Hives and Nausea And Vomiting   Latex Hives, Nausea And Vomiting and Rash   Lab Results:  Results for orders placed or performed during the hospital encounter of 12/17/23 (from the past 48 hours)  Urine Drug Screen, Qualitative     Status: None   Collection Time: 12/17/23  8:00 PM  Result Value Ref Range   Tricyclic, Ur Screen NONE DETECTED NONE DETECTED   Amphetamines, Ur Screen NONE DETECTED NONE DETECTED   MDMA (Ecstasy)Ur Screen NONE DETECTED NONE DETECTED   Cocaine Metabolite,Ur [place] NONE DETECTED NONE DETECTED    Opiate, Ur Screen NONE DETECTED NONE DETECTED   Phencyclidine (PCP) Ur S NONE DETECTED NONE DETECTED   Cannabinoid 50 Ng, Ur [place] NONE DETECTED NONE DETECTED   Barbiturates, Ur Screen NONE DETECTED NONE DETECTED   Benzodiazepine, Ur Scrn NONE DETECTED NONE DETECTED   Methadone Scn, Ur NONE DETECTED NONE DETECTED    Comment: (NOTE) Tricyclics + metabolites, urine    Cutoff 1000 ng/mL Amphetamines + metabolites, urine  Cutoff 1000 ng/mL MDMA (Ecstasy), urine              Cutoff 500 ng/mL Cocaine Metabolite, urine          Cutoff 300 ng/mL Opiate + metabolites, urine        Cutoff 300 ng/mL Phencyclidine (PCP), urine         Cutoff 25 ng/mL Cannabinoid, urine                 Cutoff 50 ng/mL Barbiturates + metabolites, urine  Cutoff 200 ng/mL Benzodiazepine, urine              Cutoff 200 ng/mL Methadone, urine  Cutoff 300 ng/mL  The urine drug screen provides only a preliminary, unconfirmed analytical test result and should not be used for non-medical purposes. Clinical consideration and professional judgment should be applied to any positive drug screen result due to possible interfering substances. A more specific alternate chemical method must be used in order to obtain a confirmed analytical result. Gas chromatography / mass spectrometry (GC/MS) is the preferred confirm atory method. Performed at [facility], [address] [place], [place] [address]   Comprehensive metabolic panel     Status: Abnormal   Collection Time: 12/17/23  8:01 PM  Result Value Ref Range   Sodium 140 135 - 145 mmol/L   Potassium 4.0 3.5 - 5.1 mmol/L   Chloride 112 (H) 98 - 111 mmol/L   CO2 21 (L) 22 - 32 mmol/L   Glucose, Bld 107 (H) 70 - 99 mg/dL    Comment: Glucose reference range applies only to samples taken after fasting for at least 8 hours.   BUN 15 6 - 20 mg/dL   Creatinine, Ser 8.86 0.61 - 1.24 mg/dL   Calcium  9.5 8.9 - 10.3 mg/dL   Total Protein 7.1 6.5 - 8.1 g/dL    Albumin  4.4 3.5 - 5.0 g/dL   AST 18 15 - 41 U/L   ALT 13 0 - 44 U/L   Alkaline Phosphatase 99 38 - 126 U/L   Total Bilirubin 0.8 0.0 - 1.2 mg/dL   GFR, Estimated >39 >39 mL/min    Comment: (NOTE) Calculated using the CKD-EPI Creatinine Equation (2021)    Anion gap 7 5 - 15    Comment: Performed at [facility], [address] [place], [place] [address]  Ethanol     Status: None   Collection Time: 12/17/23  8:01 PM  Result Value Ref Range   Alcohol , Ethyl (B) <15 <15 mg/dL    Comment: (NOTE) For medical purposes only. Performed at [facility], [address] [place], [place] [address]   cbc     Status: Abnormal   Collection Time: 12/17/23  8:01 PM  Result Value Ref Range   WBC 6.7 4.0 - 10.5 K/uL   RBC 4.17 (L) 4.22 - 5.81 MIL/uL   Hemoglobin 12.0 (L) 13.0 - 17.0 g/dL   HCT 63.5 (L) 60.9 - 47.9 %   MCV 87.3 80.0 - 100.0 fL   MCH 28.8 26.0 - 34.0 pg   MCHC 33.0 30.0 - 36.0 g/dL   RDW 85.7 88.4 - 84.4 %   Platelets 184 150 - 400 K/uL   nRBC 0.0 0.0 - 0.2 %    Comment: Performed at [facility], [address] [place], [place] [address]    Blood Alcohol  level:  Lab Results  Component Value Date   [facility] <15 12/17/2023   ETH <15 11/03/2023    Metabolic Disorder Labs:  Lab Results  Component Value Date   HGBA1C 5.1 04/15/2023   MPG 99.67 04/15/2023   MPG 88 12/13/2022   No results found for: PROLACTIN Lab Results  Component Value Date   CHOL 95 03/25/2022   TRIG 52 03/25/2022   HDL 42 03/25/2022   CHOLHDL 2.3 03/25/2022   VLDL 10 03/25/2022   LDLCALC 43 03/25/2022   LDLCALC 42 02/08/2022    Current Medications: Current Facility-Administered Medications  Medication Dose Route Frequency Provider Last Rate Last Admin   acetaminophen  (TYLENOL ) tablet 1,000 mg  1,000 mg Oral Q6H PRN Mills, Shnese E, NP  albuterol  (VENTOLIN  HFA) 108 (90 Base) MCG/ACT inhaler 2 puff  2 puff Inhalation Q4H PRN Mills, Shnese E, NP       alum  & mag hydroxide-simeth (MAALOX/MYLANTA) 200-200-20 MG/5ML suspension 30 mL  30 mL Oral Q4H PRN Mills, Shnese E, NP       ascorbic acid  (VITAMIN C ) tablet 500 mg  500 mg Oral Daily Mills, Shnese E, NP   500 mg at 12/19/23 0817   aspirin  EC tablet 81 mg  81 mg Oral Daily Mills, Shnese E, NP   81 mg at 12/19/23 9182   benztropine  (COGENTIN ) tablet 1 mg  1 mg Oral Daily Mills, Shnese E, NP   1 mg at 12/19/23 9180   busPIRone  (BUSPAR ) tablet 15 mg  15 mg Oral TID Mills, Shnese E, NP   15 mg at 12/19/23 1714   cholecalciferol  (VITAMIN D3) 25 MCG (1000 UNIT) tablet 1,000 Units  1,000 Units Oral Daily Mills, Shnese E, NP   1,000 Units at 12/19/23 9182   cyanocobalamin  (VITAMIN B12) tablet 1,000 mcg  1,000 mcg Oral Daily Mills, Shnese E, NP   1,000 mcg at 12/19/23 0819   feeding supplement (ENSURE PLUS HIGH PROTEIN) liquid 237 mL  237 mL Oral BID BM Margerie Fraiser, MD   237 mL at 12/19/23 1352   fenofibrate  tablet 54 mg  54 mg Oral Daily Mills, Shnese E, NP   54 mg at 12/19/23 9180   FLUoxetine  (PROZAC ) capsule 20 mg  20 mg Oral Daily Mills, Shnese E, NP   20 mg at 12/19/23 0818   folic acid  (FOLVITE ) tablet 1 mg  1 mg Oral Daily Mills, Shnese E, NP   1 mg at 12/19/23 0820   hydrOXYzine  (ATARAX ) tablet 10 mg  10 mg Oral Daily Mills, Shnese E, NP   10 mg at 12/19/23 0818   hydrOXYzine  (ATARAX ) tablet 25 mg  25 mg Oral TID PRN Mills, Shnese E, NP       iron  polysaccharides (NIFEREX) capsule 150 mg  150 mg Oral Daily Mills, Shnese E, NP   150 mg at 12/19/23 0819   levothyroxine  (SYNTHROID ) tablet 88 mcg  88 mcg Oral Q0600 Mills, Shnese E, NP   88 mcg at 12/19/23 0818   linaclotide  (LINZESS ) capsule 290 mcg  290 mcg Oral Daily Mills, Shnese E, NP   290 mcg at 12/19/23 0818   magnesium  hydroxide (MILK OF MAGNESIA) suspension 30 mL  30 mL Oral Daily PRN Mills, Shnese E, NP       metoprolol  tartrate (LOPRESSOR ) tablet 25 mg  25 mg Oral BID Mills, Shnese E, NP   25 mg at 12/19/23 1713   multivitamin with minerals  tablet 1 tablet  1 tablet Oral Daily Hanzel Pizzo, MD   1 tablet at 12/19/23 1018   OLANZapine  (ZYPREXA ) injection 10 mg  10 mg Intramuscular TID PRN Mills, Shnese E, NP       OLANZapine  (ZYPREXA ) injection 5 mg  5 mg Intramuscular TID PRN Mills, Shnese E, NP       OLANZapine  (ZYPREXA ) tablet 10 mg  10 mg Oral QHS Cleotilde Hoy HERO, NP       OLANZapine  zydis (ZYPREXA ) disintegrating tablet 5 mg  5 mg Oral TID PRN Moishe Bernadette BRAVO, NP       pantoprazole  (PROTONIX ) EC tablet 40 mg  40 mg Oral QAC breakfast Mills, Shnese E, NP   40 mg at 12/19/23 0818   polyethylene glycol (MIRALAX  / GLYCOLAX ) packet 17 g  17 g Oral Daily Mills, Shnese E, NP   17 g at 12/19/23 9183   potassium chloride  SA (KLOR-CON  M) CR tablet 20 mEq  20 mEq Oral Daily Moishe Burton E, NP   20 mEq at 12/19/23 9182   rosuvastatin  (CRESTOR ) tablet 5 mg  5 mg Oral Daily Mills, Shnese E, NP   5 mg at 12/19/23 9180   senna (SENOKOT) tablet 17.2 mg  2 tablet Oral QHS Mills, Shnese E, NP       PTA Medications: Medications Prior to Admission  Medication Sig Dispense Refill Last Dose/Taking   acetaminophen  (TYLENOL ) 500 MG tablet Take 1,000 mg by mouth 3 (three) times daily as needed for mild pain or moderate pain.   Taking As Needed   ascorbic acid  (VITAMIN C ) 500 MG tablet Take 1 tablet (500 mg total) by mouth daily. 30 tablet 3 Past Week   aspirin  EC 81 MG tablet Take 1 tablet (81 mg total) by mouth daily. Swallow whole. 30 tablet 12 Past Week   benztropine  (COGENTIN ) 1 MG tablet Take 1 tablet (1 mg total) by mouth daily. 30 tablet 1 Past Week   busPIRone  (BUSPAR ) 15 MG tablet Take 15 mg by mouth 2 (two) times daily.   Past Week   cholecalciferol  (VITAMIN D3) 25 MCG (1000 UNIT) tablet Take 1,000 Units by mouth daily.   Past Week   Cyanocobalamin  (B-12) 1000 MCG CAPS Take 1 capsule by mouth daily.   Past Week   fenofibrate  54 MG tablet Take 54 mg by mouth daily.   Past Week   FLUoxetine  (PROZAC ) 20 MG capsule Take 20 mg by mouth  daily.   Past Week   gemfibrozil  (LOPID ) 600 MG tablet Take 600 mg by mouth 2 (two) times daily before a meal.   Past Week   haloperidol  (HALDOL ) 5 MG tablet Take 5 mg by mouth 2 (two) times daily.   Past Week   hydrOXYzine  (ATARAX ) 10 MG tablet Take 10 mg by mouth daily. At noon   Past Week   iron  polysaccharides (NIFEREX) 150 MG capsule Take 1 capsule (150 mg total) by mouth daily. 30 capsule 3 Past Week   lamoTRIgine  (LAMICTAL ) 25 MG tablet Take 25 mg by mouth 2 (two) times daily.   Past Week   levothyroxine  (SYNTHROID ) 88 MCG tablet Take 1 tablet (88 mcg total) by mouth daily at 6 (six) AM. 30 tablet 1 Past Week   LINZESS  290 MCG CAPS capsule Take 290 mcg by mouth daily.   Past Week   loperamide  (IMODIUM ) 2 MG capsule Take 4 mg by mouth 2 (two) times daily.   Taking   OLANZapine  (ZYPREXA ) 7.5 MG tablet Take 3.75 mg by mouth at bedtime.   Past Week   pantoprazole  (PROTONIX ) 40 MG tablet Take 1 tablet (40 mg total) by mouth daily before breakfast. 30 tablet 0 Past Week   polyethylene glycol (MIRALAX  / GLYCOLAX ) 17 g packet Take 17 g by mouth daily.   Past Week   potassium chloride  SA (KLOR-CON  M) 20 MEQ tablet Take 20 mEq by mouth daily.   Past Week   rosuvastatin  (CRESTOR ) 5 MG tablet Take 5 mg by mouth daily.   Past Week   senna (SENOKOT) 8.6 MG TABS tablet Take 2 tablets by mouth at bedtime.   Past Week   VENTOLIN  HFA 108 (90 Base) MCG/ACT inhaler Inhale into the lungs every 4 (four) hours as needed.   Taking As Needed   ABILIFY  MAINTENA 400 MG PRSY  prefilled syringe Inject 400 mg into the muscle every 28 (twenty-eight) days.      folic acid  (FOLVITE ) 1 MG tablet Take 1 tablet (1 mg total) by mouth daily. (Patient not taking: Reported on 12/19/2023) 30 tablet 3 Not Taking   metoprolol  tartrate (LOPRESSOR ) 25 MG tablet Take 1 tablet (25 mg total) by mouth 2 (two) times daily. (Patient not taking: Reported on 12/19/2023) 60 tablet 2 Not Taking   OLANZapine  (ZYPREXA ) 10 MG tablet Take 10 mg by mouth  at bedtime. (Take with 20mg  tablet to equal 30mg  total) (Patient not taking: Reported on 11/03/2023)      OLANZapine  (ZYPREXA ) 20 MG tablet Take 1 tablet (20 mg total) by mouth at bedtime. (Patient not taking: Reported on 12/18/2023) 30 tablet 1    OLANZapine  (ZYPREXA ) 5 MG tablet Add to your existing 20 mg dose of olanzapine  for nightly dose of 25 mg total (Patient not taking: Reported on 11/03/2023) 30 tablet 2     Psychiatric Specialty Exam: patient presents depressed with flat affect.  He is not overtly psychotic and thoughts are linear.  He is able to produce goal related answers and engages well in conversation.  There were no self-harm or aggressive behaviors noted at this time.  Presentation  General Appearance: Appropriate for Environment  Eye Contact:Fair  Speech:Slow  Speech Volume:Decreased    Mood and Affect  Mood:Depressed  Affect:Flat   Thought Process  Thought Processes:Linear  Descriptions of Associations:Intact  Orientation:Full (Time, Place and Person)  Thought Content:Logical  Hallucinations:No data recorded Ideas of Reference:No data recorded Suicidal Thoughts:Suicidal Thoughts: Yes, Passive SI Passive Intent and/or Plan: Without Intent; Without Plan  Homicidal Thoughts:Homicidal Thoughts: No   Sensorium  Memory:Recent Good  Judgment:Impaired  Insight:Fair   Executive Functions  Concentration:No data recorded Attention Span:No data recorded Recall:No data recorded Fund of Knowledge:No data recorded Language:No data recorded  Psychomotor Activity  Psychomotor Activity:No data recorded  Assets  Assets:No data recorded   Musculoskeletal: Strength & Muscle Tone: within normal limits Gait & Station: normal  Physical Exam: Physical Exam Vitals and nursing note reviewed.  HENT:     Head: Normocephalic.  Cardiovascular:     Rate and Rhythm: Normal rate.  Musculoskeletal:     Cervical back: Normal range of motion.  Neurological:      Mental Status: He is alert.    Review of Systems  Constitutional: Negative.   HENT: Negative.    Eyes: Negative.   Cardiovascular: Negative.   Skin: Negative.    Blood pressure 137/68, pulse 92, temperature (!) 97.4 F (36.3 C), resp. rate 18, height 6' 4 (1.93 m), weight 80.7 kg, SpO2 98%. Body mass index is 21.67 kg/m.  Principal Diagnosis: Schizophrenia (HCC) Diagnosis:  Principal Problem:   Schizophrenia [facility])   Clinical Decision Making:   Provider has called [facility] as well as taled with social work and nursing to obtain current medication list which has remained unavailable thus far today.  Patient has history of and reports has tolerated Zyprexa  well in the past so we will start Zyprexa  at 10 mg by mouth at bedtime as well as well as provide order for as needed dosing for agitation/psychosis  Patient requires inpatient psychiatric treatment related to thoughts to harm self in the context of decompensated mood and increased psychosis. Presentation is consistent with historical diagnosis of Schizoaffective Disorder so will carry forward.   Treatment Plan Summary:  Safety and Monitoring:             --  Voluntary admission to inpatient psychiatric unit for safety, stabilization and treatment             -- Daily contact with patient to assess and evaluate symptoms and progress in treatment             -- Patient's case to be discussed in multi-disciplinary team meeting             -- Observation Level: q15 minute checks             -- Vital signs:  q12 hours             -- Precautions: suicide, elopement, and assault   2. Psychiatric Diagnoses and Treatment:                --begin Zyprexa  10mg  po at bedtime, will start at this beginning dose out of abundance of caution until at home medication list can be obtained    -- The risks/benefits/side-effects/alternatives to this medication were discussed in detail with the patient and time was given for questions. The  patient consents to medication trial.                -- Metabolic profile and EKG monitoring obtained while on an atypical antipsychotic (BMI: Lipid Panel: HbgA1c: QTc:)              -- Encouraged patient to participate in unit milieu and in scheduled group therapies                            3. Medical Issues Being Addressed: pt has been medically cleared for inpatient psychiatric treatment     4. Discharge Planning:              -- Social work and case management to assist with discharge planning and identification of hospital follow-up needs prior to discharge             -- Estimated LOS: 5-7 days             -- Discharge Concerns: Need to establish a safety plan; Medication compliance and effectiveness             -- Discharge Goals: Return home with outpatient referrals follow ups  Physician Treatment Plan for Primary Diagnosis: Schizophrenia (HCC) Long Term Goal(s): Improvement in symptoms so as ready for discharge  Short Term Goals: Ability to identify changes in lifestyle to reduce recurrence of condition will improve  Physician Treatment Plan for Secondary Diagnosis: Principal Problem:   Schizophrenia (HCC)  Long Term Goal(s): Improvement in symptoms so as ready for discharge  Short Term Goals: Ability to identify changes in lifestyle to reduce recurrence of condition will improve  I certify that inpatient services furnished can reasonably be expected to improve the patient's condition.    Hoy CHRISTELLA Pinal, NP 6/9/20255:47 PM

## 2023-12-19 NOTE — [facility] Counselor (Signed)
 CSW spoke with the pts guardian to request medication list.   She reports that she will send [facility] medication list.    CSW is awaiting the contact information at this time.   Shasta Deist, MSW, LCSW 12/19/2023 2:57 PM

## 2023-12-19 NOTE — BH IP Treatment Plan (Signed)
 Interdisciplinary Treatment and Diagnostic Plan Update  12/19/2023 Time of Session: 10:37AM DENT PLANTZ MRN: [id number]  Principal Diagnosis: <principal problem not specified>  Secondary Diagnoses: Active Problems:   Schizophrenia (HCC)   Current Medications:  Current Facility-Administered Medications  Medication Dose Route Frequency Provider Last Rate Last Admin   acetaminophen  (TYLENOL ) tablet 1,000 mg  1,000 mg Oral Q6H PRN Mills, Shnese E, NP       albuterol  (VENTOLIN  HFA) 108 (90 Base) MCG/ACT inhaler 2 puff  2 puff Inhalation Q4H PRN Mills, Shnese E, NP       alum & mag hydroxide-simeth (MAALOX/MYLANTA) 200-200-20 MG/5ML suspension 30 mL  30 mL Oral Q4H PRN Mills, Shnese E, NP       ascorbic acid  (VITAMIN C ) tablet 500 mg  500 mg Oral Daily Mills, Shnese E, NP   500 mg at 12/19/23 0817   aspirin  EC tablet 81 mg  81 mg Oral Daily Mills, Shnese E, NP   81 mg at 12/19/23 0263   benztropine  (COGENTIN ) tablet 1 mg  1 mg Oral Daily Mills, Shnese E, NP   1 mg at 12/19/23 7858   busPIRone  (BUSPAR ) tablet 15 mg  15 mg Oral TID Mills, Shnese E, NP   15 mg at 12/19/23 0817   cholecalciferol  (VITAMIN D3) 25 MCG (1000 UNIT) tablet 1,000 Units  1,000 Units Oral Daily Mills, Shnese E, NP   1,000 Units at 12/19/23 0817   cyanocobalamin (VITAMIN B12) tablet 1,000 mcg  1,000 mcg Oral Daily Mills, Shnese E, NP   1,000 mcg at 12/19/23 0819   feeding supplement (ENSURE PLUS HIGH PROTEIN) liquid 237 mL  237 mL Oral BID BM Jadapalle, Sree, MD   237 mL at 12/19/23 1024   fenofibrate  tablet 54 mg  54 mg Oral Daily Mills, Shnese E, NP   54 mg at 12/19/23 0819   FLUoxetine (PROZAC) capsule 20 mg  20 mg Oral Daily Mills, Shnese E, NP   20 mg at 12/19/23 0818   folic acid  (FOLVITE ) tablet 1 mg  1 mg Oral Daily Mills, Shnese E, NP   1 mg at 12/19/23 0820   hydrOXYzine  (ATARAX ) tablet 10 mg  10 mg Oral Daily Mills, Shnese E, NP   10 mg at 12/19/23 0818   hydrOXYzine  (ATARAX ) tablet 25 mg  25 mg Oral TID PRN  Mills, Shnese E, NP       iron  polysaccharides (NIFEREX) capsule 150 mg  150 mg Oral Daily Mills, Shnese E, NP   150 mg at 12/19/23 8502   lamoTRIgine  (LAMICTAL ) tablet 25 mg  25 mg Oral BID Mills, Shnese E, NP   25 mg at 12/19/23 0819   levothyroxine  (SYNTHROID ) tablet 88 mcg  88 mcg Oral Q0600 Mills, Shnese E, NP   88 mcg at 12/19/23 0818   linaclotide (LINZESS) capsule 290 mcg  290 mcg Oral Daily Mills, Shnese E, NP   290 mcg at 12/19/23 0818   magnesium  hydroxide (MILK OF MAGNESIA) suspension 30 mL  30 mL Oral Daily PRN Mills, Shnese E, NP       metoprolol  tartrate (LOPRESSOR ) tablet 25 mg  25 mg Oral BID Mills, Shnese E, NP   25 mg at 12/19/23 7741   multivitamin with minerals tablet 1 tablet  1 tablet Oral Daily Jadapalle, Sree, MD   1 tablet at 12/19/23 1018   OLANZapine  (ZYPREXA ) injection 10 mg  10 mg Intramuscular TID PRN Doneen Fuelling, NP       OLANZapine  (ZYPREXA )  injection 5 mg  5 mg Intramuscular TID PRN Mills, Shnese E, NP       OLANZapine  (ZYPREXA ) tablet 3.75 mg  3.75 mg Oral QHS Mills, Shnese E, NP       OLANZapine  zydis (ZYPREXA ) disintegrating tablet 5 mg  5 mg Oral TID PRN Mills, Shnese E, NP       pantoprazole  (PROTONIX ) EC tablet 40 mg  40 mg Oral QAC breakfast Mills, Shnese E, NP   40 mg at 12/19/23 0818   polyethylene glycol (MIRALAX  / GLYCOLAX ) packet 17 g  17 g Oral Daily Mills, Shnese E, NP   17 g at 12/19/23 5638   potassium chloride  SA (KLOR-CON  M) CR tablet 20 mEq  20 mEq Oral Daily Orvil Bland E, NP   20 mEq at 12/19/23 7564   rosuvastatin  (CRESTOR ) tablet 5 mg  5 mg Oral Daily Mills, Shnese E, NP   5 mg at 12/19/23 3329   senna (SENOKOT) tablet 17.2 mg  2 tablet Oral QHS Mills, Shnese E, NP       PTA Medications: Medications Prior to Admission  Medication Sig Dispense Refill Last Dose/Taking   acetaminophen  (TYLENOL ) 500 MG tablet Take 1,000 mg by mouth 3 (three) times daily as needed for mild pain or moderate pain.   Taking As Needed   ascorbic acid   (VITAMIN C ) 500 MG tablet Take 1 tablet (500 mg total) by mouth daily. 30 tablet 3 Past Week   aspirin  EC 81 MG tablet Take 1 tablet (81 mg total) by mouth daily. Swallow whole. 30 tablet 12 Past Week   benztropine  (COGENTIN ) 1 MG tablet Take 1 tablet (1 mg total) by mouth daily. 30 tablet 1 Past Week   busPIRone  (BUSPAR ) 15 MG tablet Take 15 mg by mouth 2 (two) times daily.   Past Week   cholecalciferol  (VITAMIN D3) 25 MCG (1000 UNIT) tablet Take 1,000 Units by mouth daily.   Past Week   Cyanocobalamin (B-12) 1000 MCG CAPS Take 1 capsule by mouth daily.   Past Week   fenofibrate  54 MG tablet Take 54 mg by mouth daily.   Past Week   FLUoxetine (PROZAC) 20 MG capsule Take 20 mg by mouth daily.   Past Week   gemfibrozil  (LOPID ) 600 MG tablet Take 600 mg by mouth 2 (two) times daily before a meal.   Past Week   haloperidol  (HALDOL ) 5 MG tablet Take 5 mg by mouth 2 (two) times daily.   Past Week   hydrOXYzine  (ATARAX ) 10 MG tablet Take 10 mg by mouth daily. At noon   Past Week   iron  polysaccharides (NIFEREX) 150 MG capsule Take 1 capsule (150 mg total) by mouth daily. 30 capsule 3 Past Week   lamoTRIgine  (LAMICTAL ) 25 MG tablet Take 25 mg by mouth 2 (two) times daily.   Past Week   levothyroxine  (SYNTHROID ) 88 MCG tablet Take 1 tablet (88 mcg total) by mouth daily at 6 (six) AM. 30 tablet 1 Past Week   LINZESS 290 MCG CAPS capsule Take 290 mcg by mouth daily.   Past Week   loperamide  (IMODIUM ) 2 MG capsule Take 4 mg by mouth 2 (two) times daily.   Taking   OLANZapine  (ZYPREXA ) 7.5 MG tablet Take 3.75 mg by mouth at bedtime.   Past Week   pantoprazole  (PROTONIX ) 40 MG tablet Take 1 tablet (40 mg total) by mouth daily before breakfast. 30 tablet 0 Past Week   polyethylene glycol (MIRALAX  / GLYCOLAX ) 17 g packet Take  17 g by mouth daily.   Past Week   potassium chloride  SA (KLOR-CON  M) 20 MEQ tablet Take 20 mEq by mouth daily.   Past Week   rosuvastatin  (CRESTOR ) 5 MG tablet Take 5 mg by mouth daily.    Past Week   senna (SENOKOT) 8.6 MG TABS tablet Take 2 tablets by mouth at bedtime.   Past Week   VENTOLIN  HFA 108 (90 Base) MCG/ACT inhaler Inhale into the lungs every 4 (four) hours as needed.   Taking As Needed   ABILIFY  MAINTENA 400 MG PRSY prefilled syringe Inject 400 mg into the muscle every 28 (twenty-eight) days.      folic acid  (FOLVITE ) 1 MG tablet Take 1 tablet (1 mg total) by mouth daily. (Patient not taking: Reported on 12/19/2023) 30 tablet 3 Not Taking   metoprolol  tartrate (LOPRESSOR ) 25 MG tablet Take 1 tablet (25 mg total) by mouth 2 (two) times daily. (Patient not taking: Reported on 12/19/2023) 60 tablet 2 Not Taking   OLANZapine  (ZYPREXA ) 10 MG tablet Take 10 mg by mouth at bedtime. (Take with 20mg  tablet to equal 30mg  total) (Patient not taking: Reported on 11/03/2023)      OLANZapine  (ZYPREXA ) 20 MG tablet Take 1 tablet (20 mg total) by mouth at bedtime. (Patient not taking: Reported on 12/18/2023) 30 tablet 1    OLANZapine  (ZYPREXA ) 5 MG tablet Add to your existing 20 mg dose of olanzapine  for nightly dose of 25 mg total (Patient not taking: Reported on 11/03/2023) 30 tablet 2     Patient Stressors:    Patient Strengths:    Treatment Modalities: Medication Management, Group therapy, Case management,  1 to 1 session with clinician, Psychoeducation, Recreational therapy.   Physician Treatment Plan for Primary Diagnosis: <principal problem not specified> Long Term Goal(s):     Short Term Goals:    Medication Management: Evaluate patient's response, side effects, and tolerance of medication regimen.  Therapeutic Interventions: 1 to 1 sessions, Unit Group sessions and Medication administration.  Evaluation of Outcomes: Not Met  Physician Treatment Plan for Secondary Diagnosis: Active Problems:   Schizophrenia (HCC)  Long Term Goal(s):     Short Term Goals:       Medication Management: Evaluate patient's response, side effects, and tolerance of medication  regimen.  Therapeutic Interventions: 1 to 1 sessions, Unit Group sessions and Medication administration.  Evaluation of Outcomes: Not Met   RN Treatment Plan for Primary Diagnosis: <principal problem not specified> Long Term Goal(s): Knowledge of disease and therapeutic regimen to maintain health will improve  Short Term Goals: Ability to demonstrate self-control, Ability to participate in decision making will improve, Ability to verbalize feelings will improve, Ability to disclose and discuss suicidal ideas, Ability to identify and develop effective coping behaviors will improve, and Compliance with prescribed medications will improve  Medication Management: RN will administer medications as ordered by provider, will assess and evaluate patient's response and provide education to patient for prescribed medication. RN will report any adverse and/or side effects to prescribing provider.  Therapeutic Interventions: 1 on 1 counseling sessions, Psychoeducation, Medication administration, Evaluate responses to treatment, Monitor vital signs and CBGs as ordered, Perform/monitor CIWA, COWS, AIMS and Fall Risk screenings as ordered, Perform wound care treatments as ordered.  Evaluation of Outcomes: Not Met   LCSW Treatment Plan for Primary Diagnosis: <principal problem not specified> Long Term Goal(s): Safe transition to appropriate next level of care at discharge, Engage patient in therapeutic group addressing interpersonal concerns.  Short Term Goals:  Engage patient in aftercare planning with referrals and resources, Increase social support, Increase ability to appropriately verbalize feelings, Increase emotional regulation, Facilitate acceptance of mental health diagnosis and concerns, and Increase skills for wellness and recovery  Therapeutic Interventions: Assess for all discharge needs, 1 to 1 time with Social worker, Explore available resources and support systems, Assess for adequacy in  community support network, Educate family and significant other(s) on suicide prevention, Complete Psychosocial Assessment, Interpersonal group therapy.  Evaluation of Outcomes: Not Met   Progress in Treatment: Attending groups: No. Participating in groups: No. Taking medication as prescribed: Yes. Toleration medication: Yes. Family/Significant other contact made: No, will contact:  CSW to contact the patient's guardian.  Patient understands diagnosis: Yes. Discussing patient identified problems/goals with staff: Yes. Medical problems stabilized or resolved: Yes. Denies suicidal/homicidal ideation: Yes. Issues/concerns per patient self-inventory: No. Other: non  New problem(s) identified: No, Describe:  none  New Short Term/Long Term Goal(s): detox, elimination of symptoms of psychosis, medication management for mood stabilization; elimination of SI thoughts; development of comprehensive mental wellness/sobriety plan.   Patient Goals:  "get a good nights rest"  Discharge Plan or Barriers: CSW to assist in the development of appropriate discharge plans.  Patient reports a desire to find a new placement.  CSW to reach out to the patient's guardian.    Reason for Continuation of Hospitalization: Anxiety Depression Medication stabilization Suicidal ideation  Estimated Length of Stay:  1-7 days  Last 3 [place] Suicide Severity Risk Score: Flowsheet Row Admission (Current) from 12/18/2023 in [facility] INPATIENT BEHAVIORAL MEDICINE ED from 12/17/2023 in [facility] Emergency Department at [facility] ED from 11/30/2023 in [facility] Department at [facility]  C-SSRS RISK CATEGORY Low Risk  [Simultaneous filing. User may not have seen previous data.] High Risk No Risk       Last PHQ 2/9 Scores:     No data to display          Scribe for Treatment Team: KENSINGTON DUERST, LCSW 12/19/2023 11:06 AM

## 2023-12-19 NOTE — Progress Notes (Addendum)
   12/19/23 1000  Spiritual Encounters  Type of Visit Initial  Care provided to: Patient  Referral source Patient request  Reason for visit Routine spiritual support  OnCall Visit Yes  Spiritual Framework  Presenting Themes Meaning/purpose/sources of inspiration  Interventions  Spiritual Care Interventions Made Established relationship of care and support;Compassionate presence  Intervention Outcomes  Outcomes Connection to spiritual care   Chaplain provided compassionate presence. Chaplain listen and encouraged the patient. Patient is wanting to go to a facility that will give them the proper care to continue journey toward wellness.

## 2023-12-19 NOTE — Progress Notes (Signed)
   12/18/23 2000  Psych Admission Type (Psych Patients Only)  Admission Status Voluntary  Psychosocial Assessment  Patient Complaints Anxiety;Restlessness  Eye Contact Fair;Brief  Facial Expression Flat  Affect Apprehensive  Speech Soft  Interaction Assertive  Motor Activity Slow;Unsteady  Appearance/Hygiene Disheveled  Behavior Characteristics Cooperative  Mood Depressed  Thought Process  Coherency WDL  Content WDL  Delusions None reported or observed  Perception WDL  Hallucination Auditory  Judgment Impaired  Confusion None  Danger to Self  Current suicidal ideation? Denies   Patient denies SI/HI/AVH, alert and oriented to person and place, he forwards very little information,he appears responding to internal stimuli, no distress noted, 15 minutes safety checks maintained.

## 2023-12-19 NOTE — Progress Notes (Signed)
   12/19/23 0921  Psych Admission Type (Psych Patients Only)  Admission Status Voluntary  Psychosocial Assessment  Patient Complaints None  Eye Contact Fair  Facial Expression Flat  Affect Flat  Speech Logical/coherent  Interaction Assertive  Motor Activity Slow  Appearance/Hygiene Disheveled  Behavior Characteristics Cooperative  Mood Pleasant  Thought Process  Coherency WDL  Content WDL  Delusions None reported or observed  Perception WDL  Hallucination None reported or observed  Judgment Impaired  Confusion None  Danger to Self  Current suicidal ideation? Denies  Self-Injurious Behavior No self-injurious ideation or behavior indicators observed or expressed   Agreement Not to Harm Self Yes  Description of Agreement verbal  Danger to Others  Danger to Others None reported or observed

## 2023-12-19 NOTE — Progress Notes (Signed)
   12/19/23 2100  Psych Admission Type (Psych Patients Only)  Admission Status Voluntary  Psychosocial Assessment  Patient Complaints None  Eye Contact Fair  Facial Expression Flat  Affect Flat  Speech Logical/coherent  Interaction Assertive  Motor Activity Slow  Appearance/Hygiene Disheveled  Behavior Characteristics Cooperative;Appropriate to situation  Mood Pleasant  Thought Process  Coherency WDL  Content WDL  Delusions None reported or observed  Perception WDL  Hallucination None reported or observed  Judgment Impaired  Confusion None  Danger to Self  Current suicidal ideation? Denies  Self-Injurious Behavior No self-injurious ideation or behavior indicators observed or expressed   Agreement Not to Harm Self Yes  Description of Agreement verbal

## 2023-12-20 MED ORDER — LAMOTRIGINE 25 MG PO TABS
25.0000 mg | ORAL_TABLET | Freq: Two times a day (BID) | ORAL | Status: DC
  Administered 2023-12-20 – 2024-01-02 (×26): 25 mg via ORAL
  Filled 2023-12-20 (×26): qty 1

## 2023-12-20 MED ORDER — ENSURE PLUS HIGH PROTEIN PO LIQD
237.0000 mL | Freq: Three times a day (TID) | ORAL | Status: DC
  Administered 2023-12-21 – 2024-01-05 (×44): 237 mL via ORAL

## 2023-12-20 NOTE — Group Note (Signed)
 Date:  12/20/2023 Time:  11:08 AM  Group Topic/Focus:  Wellness Toolbox:   The focus of this group is to discuss various aspects of wellness, balancing those aspects and exploring ways to increase the ability to experience wellness.  Patients will create a wellness toolbox for use upon discharge.    Participation Level:  Did Not Attend  Participation Quality:    Affect:    Cognitive:    Insight:   Engagement in Group:    Modes of Intervention:    Additional Comments:    Darly Fails 12/20/2023, 11:08 AM

## 2023-12-20 NOTE — Group Note (Signed)
 LCSW Group Therapy Note   Group Date: 12/20/2023 Start Time: 1300 End Time: 1400   Type of Therapy and Topic:  Group Therapy: Challenging Core Beliefs  Participation Level:  Did Not Attend  Description of Group:  Patients were educated about core beliefs and asked to identify one harmful core belief that they have. Patients were asked to explore from where those beliefs originate. Patients were asked to discuss how those beliefs make them feel and the resulting behaviors of those beliefs. They were then be asked if those beliefs are true and, if so, what evidence they have to support them. Lastly, group members were challenged to replace those negative core beliefs with helpful beliefs.   Therapeutic Goals:   1. Patient will identify harmful core beliefs and explore the origins of such beliefs. 2. Patient will identify feelings and behaviors that result from those core beliefs. 3. Patient will discuss whether such beliefs are true. 4.  Patient will replace harmful core beliefs with helpful ones.  Summary of Patient Progress:  Patient did not attend.   Therapeutic Modalities: Cognitive Behavioral Therapy; Solution-Focused Therapy   Derrick Atkins M Derrick Atkins, LCSWA 12/20/2023  1:45 PM
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
## 2023-12-20 NOTE — Plan of Care (Signed)

## 2023-12-20 NOTE — Group Note (Signed)
 Recreation Therapy Group Note   Group Topic:Goal Setting  Group Date: 12/20/2023 Start Time: 1005 End Time: 1055 Facilitators: Deatrice Factor, LRT, CTRS Location: Craft Room  Group Description: Product/process development scientist. Patients were given many different magazines, a glue stick, markers, and a piece of cardstock paper. LRT and pts discussed the importance of having goals in life. LRT and pts discussed the difference between short-term and long-term goals, as well as what a SMART goal is. LRT encouraged pts to create a vision board, with images they picked and then cut out with safety scissors from the magazine, for themselves, that capture their short and long-term goals. LRT encouraged pts to show and explain their vision board to the group.   Goal Area(s) Addressed:  Patient will gain knowledge of short vs. long term goals.  Patient will identify goals for themselves. Patient will practice setting SMART goals. Patient will verbalize their goals to LRT and peers.   Affect/Mood: Flat   Participation Level: Minimal    Clinical Observations/Individualized Feedback: [place] was present in the craft room at the time of group. Pt chose not to make a vision board, but shared that his goal is "to write a novel". Pt minimally interacted with LRT and peers while in group.   Plan: Continue to engage patient in RT group sessions 2-3x/week.   Deatrice Factor, LRT, CTRS 12/20/2023 1:07 PM

## 2023-12-20 NOTE — BHH Counselor (Signed)
 CSW contacted [employer], [place] [phone].  CSW called and left a VM message requesting a return phone call.  Shasta Deist, MSW, LCSW 12/20/2023 8:25 AM

## 2023-12-20 NOTE — Progress Notes (Signed)
   12/20/23 1000  Spiritual Encounters  Type of Visit Attempt (pt unavailable)  Care provided to: Pt not available  Conversation partners present during encounter Social worker/Care management/TOC  Reason for visit Routine spiritual support  OnCall Visit No   Chaplain attempted to visit patient for a [employer] for prayer in the EPIC system.  Patient wasn't in his room and was likely in Group.  Chaplain shared with on-call Chaplain to see if evening PRN Chaplain could attempt to visit patient and offer prayer as requested.    Rev. Rana M. Nolon Baxter, M.Div. Chaplain Resident [facility]

## 2023-12-20 NOTE — Group Note (Signed)
 Date:  12/20/2023 Time:  8:49 PM  Group Topic/Focus:  Wrap-Up Group:   The focus of this group is to help patients review their daily goal of treatment and discuss progress on daily workbooks.    Participation Level:  Active  Participation Quality:  Appropriate and Attentive  Affect:  Appropriate  Cognitive:  Alert and Appropriate  Insight: Appropriate  Engagement in Group:  None  Modes of Intervention:  Discussion and Support  Additional Comments:     Maglione,Labrittany Wechter E 12/20/2023, 8:49 PM

## 2023-12-20 NOTE — Progress Notes (Signed)
   12/20/23 1200  Psych Admission Type (Psych Patients Only)  Admission Status Voluntary  Psychosocial Assessment  Patient Complaints Self-harm thoughts (Pain)  Eye Contact Fair  Facial Expression Sad  Affect Sad;Apprehensive  Speech Logical/coherent;Soft  Interaction Assertive  Motor Activity Slow  Appearance/Hygiene Disheveled  Behavior Characteristics Cooperative  Mood Sad;Pleasant  Thought Process  Coherency WDL  Content WDL  Delusions None reported or observed  Perception WDL  Hallucination None reported or observed  Judgment Poor  Confusion None  Danger to Self  Current suicidal ideation? Passive  Self-Injurious Behavior No self-injurious ideation or behavior indicators observed or expressed   Agreement Not to Harm Self Yes  Description of Agreement Verbal  Danger to Others  Danger to Others None reported or observed

## 2023-12-20 NOTE — Group Note (Signed)
 Date:  12/20/2023 Time:  6:30 PM  Group Topic/Focus:  Wellness Toolbox:   The focus of this group is to discuss various aspects of wellness, balancing those aspects and exploring ways to increase the ability to experience wellness.  Patients will create a wellness toolbox for use upon discharge.    Participation Level:  Active  Participation Quality:  Appropriate  Affect:  Appropriate  Cognitive:  Appropriate  Insight: Appropriate  Engagement in Group:  Engaged  Modes of Intervention:  Activity  Additional Comments:    Derrick Atkins 12/20/2023, 6:30 PM

## 2023-12-20 NOTE — Group Note (Unsigned)
 Date:  12/20/2023 Time:  9:33 AM  Group Topic/Focus:  Goals Group:   The focus of this group is to help patients establish daily goals to achieve during treatment and discuss how the patient can incorporate goal setting into their daily lives to aide in recovery.     Participation Level:  {BHH PARTICIPATION GEXBM:84132}  Participation Quality:  {BHH PARTICIPATION QUALITY:22265}  Affect:  {BHH AFFECT:22266}  Cognitive:  {BHH COGNITIVE:22267}  Insight: {BHH Insight2:20797}  Engagement in Group:  {BHH ENGAGEMENT IN GMWNU:27253}  Modes of Intervention:  {BHH MODES OF INTERVENTION:22269}  Additional Comments:  ***  Juanisha Bautch A Donae Kueker 12/20/2023, 9:33 AM

## 2023-12-20 NOTE — Progress Notes (Signed)
 Nutrition Follow-up  DOCUMENTATION CODES:   Not applicable  INTERVENTION:   -Continue regular diet -Continue MVI with minerals daily -Increase Ensure Plus High Protein po to TID, each supplement provides 350 kcal and 20 grams of protein -Magic cup TID with meals, each supplement provides 290 kcal and 9 grams of protein   NUTRITION DIAGNOSIS:   Inadequate oral intake related to poor appetite as evidenced by per patient/family report.  Ongoing  GOAL:   Patient will meet greater than or equal to 90% of their needs  Progressing   MONITOR:   PO intake, Supplement acceptance  REASON FOR ASSESSMENT:   Consult Assessment of nutrition requirement/status, Poor PO  ASSESSMENT:   Pt with past psychiatric history of Schizophrenia, MDD, Adjustment Disorder, who presented with concerns for worsening depression, hallucinations, and self-harming thoughts.  Pt unavailable at time of visit. RD went down to unit to visit pt, however, pt was not in room at time of visit.   Pt on a regular diet. No meal completion data available to assess at this time. Pt is drinking Ensure supplements.   Pt with variable participation in group sessions.   Per consult note, pt reports a 20# wt loss over the past few months. RD reviewed wt hx; pt has experienced a 8.8% wt loss over the past 7 months, which is not significant for time frame. Per nursing assessment, pt with moderate edema, which may be masking true weight loss as well as fat and muscle depletions.   Medications reviewed and include vitamin C , buspar , vitamin D3, fenofibrate , folic acid , protonix , miralax , and senokot.   Per social work notes, pt was from [employer], however, cannot return secondary to appropriate behaviors.   Labs reviewed: CBGS: 78 (inpatient orders for glycemic control are none).    Diet Order:   Diet Order             Diet regular Room service appropriate? Yes; Fluid consistency: Thin  Diet effective now                    EDUCATION NEEDS:   No education needs have been identified at this time  Skin:  Skin Assessment: Reviewed RN Assessment  Last BM:  12/18/23  Height:   Ht Readings from Last 1 Encounters:  12/18/23 6\' 4"  (1.93 m)    Weight:   Wt Readings from Last 1 Encounters:  12/18/23 80.7 kg    Ideal Body Weight:  91.8 kg  BMI:  Body mass index is 21.67 kg/m.  Estimated Nutritional Needs:   Kcal:  2200-2400  Protein:  120-135 grams  Fluid:  2.0-2.2 L    Herschel Lords, RD, LDN, CDCES Registered Dietitian III Certified Diabetes Care and Education Specialist If unable to reach this RD, please use "RD Inpatient" group chat on secure chat between hours of 8am-4 pm daily

## 2023-12-21 MED ORDER — OLANZAPINE 5 MG PO TABS
15.0000 mg | ORAL_TABLET | Freq: Every day | ORAL | Status: DC
  Administered 2023-12-21 – 2024-01-04 (×15): 15 mg via ORAL
  Filled 2023-12-21 (×15): qty 1

## 2023-12-21 NOTE — Group Note (Signed)
 [facility] LCSW Group Therapy Note   Group Date: 12/21/2023 Start Time: 1300 End Time: 1400   Type of Therapy/Topic:  Group Therapy:  Emotion Regulation  Participation Level:  Did Not Attend   Mood:  Description of Group:    The purpose of this group is to assist patients in learning to regulate negative emotions and experience positive emotions. Patients will be guided to discuss ways in which they have been vulnerable to their negative emotions. These vulnerabilities will be juxtaposed with experiences of positive emotions or situations, and patients challenged to use positive emotions to combat negative ones. Special emphasis will be placed on coping with negative emotions in conflict situations, and patients will process healthy conflict resolution skills.  Therapeutic Goals: Patient will identify two positive emotions or experiences to reflect on in order to balance out negative emotions:  Patient will label two or more emotions that they find the most difficult to experience:  Patient will be able to demonstrate positive conflict resolution skills through discussion or role plays:   Summary of Patient Progress: Patient declined to attend treatment team.    Therapeutic Modalities:   Cognitive Behavioral Therapy Feelings Identification Dialectical Behavioral Therapy   Larri Ply, LCSW

## 2023-12-21 NOTE — Progress Notes (Signed)
  Chaplain On-Call responded to Spiritual Care Consult Order from Aurelio Blower, MD. The request was for prayer with the patient.  Chaplain met the patient and visited while he was eating his lunch. The patient spoke about the examples of a life of faith that he received from his parents. He stated also that he experiences prayer as talking with God, and that he receives inspiration from music and nature. He described that he felt overwhelmed with his life, and that prompted him to be in our hospital.  Chaplain provided spiritual and emotional support and prayer.  Chaplain Dean Every., [facility]

## 2023-12-21 NOTE — Plan of Care (Signed)
   Problem: Education: Goal: Knowledge of [facility] Education information/materials will improve Outcome: Progressing Goal: Emotional status will improve Outcome: Progressing Goal: Mental status will improve Outcome: Progressing

## 2023-12-21 NOTE — Group Note (Signed)
 Date:  12/21/2023 Time:  6:36 PM  Group Topic/Focus:  Activity Group: The focus of the group is to promote activity for the patients and to encourage them to go outside to the courtyard and get some fresh air and some exercise.    Participation Level:  Active  Participation Quality:  Appropriate  Affect:  Appropriate  Cognitive:  Appropriate  Insight: Appropriate  Engagement in Group:  Engaged  Modes of Intervention:  Activity  Additional Comments:    [name] Christropher Gintz 12/21/2023, 6:36 PM

## 2023-12-21 NOTE — Progress Notes (Signed)
   12/21/23 0000  Psych Admission Type (Psych Patients Only)  Admission Status Voluntary  Psychosocial Assessment  Patient Complaints None  Eye Contact Fair  Facial Expression Sad  Affect Sad  Speech Logical/coherent  Interaction Assertive  Motor Activity Slow  Appearance/Hygiene Disheveled  Behavior Characteristics Cooperative  Mood Sad  Thought Process  Coherency WDL  Content WDL  Delusions None reported or observed  Perception WDL  Hallucination None reported or observed  Judgment WDL  Confusion None  Danger to Self  Current suicidal ideation? Passive  Self-Injurious Behavior No self-injurious ideation or behavior indicators observed or expressed   Agreement Not to Harm Self Yes  Danger to Others  Danger to Others None reported or observed

## 2023-12-21 NOTE — Progress Notes (Signed)
 [facility] MD Progress Note  12/21/2023 11:51 AM [facility]  MRN:  [id number]   [facility] is a 53yo male with a past psychiatric history of schizophrenia, major depressive disorder, adjustment disorder who presented to the emergency department via law enforcement voluntarily with concerns of worsening depression, hallucinations and thoughts to self-harm.   Per chart review patient voluntarily reported to the hospital with report of chronic pain and difficulty making friends at [facility] as well as experiencing auditory hallucinations that put him down.  He reported hearing these voices daily and has been ongoing for the last 2 years since his mother passed away he also endorsed visions and inability to read people's minds  Subjective:  Chart reviewed, case discussed in multidisciplinary meeting, patient seen during rounds.   Patient seen today for follow-up psychiatric evaluation.  He is noted to be walking in the hall without distress and he is agreeable for interview.  In reviewing events leading to this admission he reports not wanting [employer] and finding it difficult to make friends in that setting.  He is currently very calm with no behaviors so this provider asked what prompted inappropriate and aggressive behaviors leading to this discharge to where he states I do not know I just blow up he denies any severe decompensation in mood or increase in psychotic symptoms stating I did not what came over may I really just do not like it there.  In review of psychiatric history patient reports many inpatient psychiatric admissions and long history of psychotic illness where he I heard voices all the time has been pretty bad.  We reviewed current medications as have received list from outpatient psych.  Reviewed currently prescribed Zyprexa  10 mg at night which he reports that really helped me sleep a lot I can tell a big difference.  Patient reports trouble with sleeping has been  ongoing in the outpatient setting as well as poor appetite some increase in depression.  We reviewed plan to hold Haldol  5 mg by mouth twice daily at this time in favor of using Zyprexa  as it is effective antipsychotic as well as has not side effect of providing for sleep.  He verbalizes understanding and agrees with this plan of care. Patient denies AVH paranoia and delusions. He denies SI/HI.   Sleep: Good  Appetite:  Fair  Past Psychiatric History: see h&P Family History:  Family History  Problem Relation Age of Onset   Heart disease Father    Heart attack Father    Social History:  Social History   Substance and Sexual Activity  Alcohol  Use No     Social History   Substance and Sexual Activity  Drug Use No    Social History   Socioeconomic History   Marital status: Single    Spouse name: Not on file   Number of children: Not on file   Years of education: Not on file   Highest education level: Not on file  Occupational History   Not on file  Tobacco Use   Smoking status: Never    Passive exposure: Never   Smokeless tobacco: Never  Vaping Use   Vaping status: Never Used  Substance and Sexual Activity   Alcohol  use: No   Drug use: No   Sexual activity: Not Currently  Other Topics Concern   Not on file  Social History Narrative   Not on file   Social Drivers of Health   Financial Resource Strain: Not on file  Food Insecurity: No Food Insecurity (12/18/2023)   Hunger Vital Sign    Worried About Running Out of Food in the Last Year: Never true    Ran Out of Food in the Last Year: Never true  Transportation Needs: Unmet Transportation Needs (12/18/2023)   PRAPARE - Administrator, Civil Service (Medical): Yes    Lack of Transportation (Non-Medical): Yes  Physical Activity: Not on file  Stress: Not on file  Social Connections: Not on file   Past Medical History:  Past Medical History:  Diagnosis Date   Cerebral palsy (HCC)    Depression     Diabetes mellitus without complication (HCC)    Hypertension    Kidney stones    Schizoaffective disorder (HCC)    Scoliosis     Past Surgical History:  Procedure Laterality Date   BOWEL RESECTION     CATARACT EXTRACTION W/PHACO Right 09/15/2022   Procedure: CATARACT EXTRACTION PHACO AND INTRAOCULAR LENS PLACEMENT (IOC) RIGHT DIABETIC VISION BLUE HEALON 5  15.79  01:31.9;  Surgeon: Annell Kidney, MD;  Location: [facility];  Service: Ophthalmology;  Laterality: Right;  Latex Diabetic   INCISION AND DRAINAGE ABSCESS Left 12/20/2022   Procedure: INCISION AND DRAINAGE ABSCESS;  Surgeon: Flynn Hylan, MD;  Location: [facility];  Service: General;  Laterality: Left;   LAPAROTOMY N/A 05/15/2020   Procedure: EXPLORATORY LAPAROTOMY;  Surgeon: Emmalene Hare, MD;  Location: [facility];  Service: General;  Laterality: N/A;    Current Medications: Current Facility-Administered Medications  Medication Dose Route Frequency Provider Last Rate Last Admin   acetaminophen  (TYLENOL ) tablet 1,000 mg  1,000 mg Oral Q6H PRN Mills, Shnese E, NP   1,000 mg at 12/20/23 2241   albuterol  (VENTOLIN  HFA) 108 (90 Base) MCG/ACT inhaler 2 puff  2 puff Inhalation Q4H PRN Mills, Shnese E, NP       alum & mag hydroxide-simeth (MAALOX/MYLANTA) 200-200-20 MG/5ML suspension 30 mL  30 mL Oral Q4H PRN Mills, Shnese E, NP       ascorbic acid  (VITAMIN C ) tablet 500 mg  500 mg Oral Daily Mills, Shnese E, NP   500 mg at 12/21/23 0826   aspirin  EC tablet 81 mg  81 mg Oral Daily Mills, Shnese E, NP   81 mg at 12/21/23 0824   benztropine  (COGENTIN ) tablet 1 mg  1 mg Oral Daily Mills, Shnese E, NP   1 mg at 12/21/23 0825   busPIRone  (BUSPAR ) tablet 15 mg  15 mg Oral TID Mills, Shnese E, NP   15 mg at 12/21/23 0825   cholecalciferol  (VITAMIN D3) 25 MCG (1000 UNIT) tablet 1,000 Units  1,000 Units Oral Daily Mills, Shnese E, NP   1,000 Units at 12/21/23 0825   cyanocobalamin (VITAMIN B12) tablet 1,000 mcg  1,000 mcg Oral Daily  Mills, Shnese E, NP   1,000 mcg at 12/21/23 0829   feeding supplement (ENSURE PLUS HIGH PROTEIN) liquid 237 mL  237 mL Oral TID BM Jadapalle, Sree, MD   237 mL at 12/21/23 0846   fenofibrate  tablet 54 mg  54 mg Oral Daily Mills, Shnese E, NP   54 mg at 12/21/23 0829   FLUoxetine (PROZAC) capsule 20 mg  20 mg Oral Daily Mills, Shnese E, NP   20 mg at 12/21/23 0825   folic acid  (FOLVITE ) tablet 1 mg  1 mg Oral Daily Mills, Shnese E, NP   1 mg at 12/21/23 0826   hydrOXYzine  (ATARAX ) tablet 10 mg  10 mg Oral  Daily Mills, Shnese E, NP   10 mg at 12/21/23 4010   hydrOXYzine  (ATARAX ) tablet 25 mg  25 mg Oral TID PRN Mills, Shnese E, NP       iron  polysaccharides (NIFEREX) capsule 150 mg  150 mg Oral Daily Mills, Shnese E, NP   150 mg at 12/21/23 0829   lamoTRIgine  (LAMICTAL ) tablet 25 mg  25 mg Oral BID Angelia Barcelona, NP   25 mg at 12/21/23 0826   levothyroxine  (SYNTHROID ) tablet 88 mcg  88 mcg Oral Q0600 Mills, Shnese E, NP   88 mcg at 12/21/23 2725   linaclotide (LINZESS) capsule 290 mcg  290 mcg Oral Daily Mills, Shnese E, NP   290 mcg at 12/21/23 3664   magnesium  hydroxide (MILK OF MAGNESIA) suspension 30 mL  30 mL Oral Daily PRN Mills, Shnese E, NP       metoprolol  tartrate (LOPRESSOR ) tablet 25 mg  25 mg Oral BID Mills, Shnese E, NP   25 mg at 12/21/23 4034   multivitamin with minerals tablet 1 tablet  1 tablet Oral Daily Jadapalle, Sree, MD   1 tablet at 12/21/23 7425   OLANZapine  (ZYPREXA ) injection 10 mg  10 mg Intramuscular TID PRN Doneen Fuelling, NP       OLANZapine  (ZYPREXA ) injection 5 mg  5 mg Intramuscular TID PRN Mills, Shnese E, NP       OLANZapine  (ZYPREXA ) tablet 15 mg  15 mg Oral QHS Angelia Barcelona, NP       OLANZapine  zydis (ZYPREXA ) disintegrating tablet 5 mg  5 mg Oral TID PRN Mills, Shnese E, NP       pantoprazole  (PROTONIX ) EC tablet 40 mg  40 mg Oral QAC breakfast Mills, Shnese E, NP   40 mg at 12/21/23 0826   polyethylene glycol (MIRALAX  / GLYCOLAX ) packet 17 g  17 g  Oral Daily Mills, Shnese E, NP   17 g at 12/20/23 0827   potassium chloride  SA (KLOR-CON  M) CR tablet 20 mEq  20 mEq Oral Daily Mills, Shnese E, NP   20 mEq at 12/21/23 9563   rosuvastatin  (CRESTOR ) tablet 5 mg  5 mg Oral Daily Mills, Shnese E, NP   5 mg at 12/20/23 0827   senna (SENOKOT) tablet 17.2 mg  2 tablet Oral QHS Mills, Shnese E, NP   17.2 mg at 12/20/23 2241    Lab Results: No results found for this or any previous visit (from the past 48 hours).  Blood Alcohol  level:  Lab Results  Component Value Date   [facility] <15 12/17/2023   ETH <15 11/03/2023    Metabolic Disorder Labs: Lab Results  Component Value Date   HGBA1C 5.1 04/15/2023   MPG 99.67 04/15/2023   MPG 88 12/13/2022   No results found for: PROLACTIN Lab Results  Component Value Date   CHOL 95 03/25/2022   TRIG 52 03/25/2022   HDL 42 03/25/2022   CHOLHDL 2.3 03/25/2022   VLDL 10 03/25/2022   LDLCALC 43 03/25/2022   LDLCALC 42 02/08/2022     Psychiatric Specialty Exam:  Presentation  General Appearance:  Appropriate for Environment  Eye Contact: Fair  Speech: Slow  Speech Volume: Decreased    Mood and Affect  Mood: Depressed  Affect: Flat   Thought Process  Thought Processes: Linear  Descriptions of Associations:Intact  Orientation:Full (Time, Place and Person)  Thought Content:Logical  Hallucinations:No data recorded Ideas of Reference:No data recorded Suicidal Thoughts:No data recorded Homicidal Thoughts:No data recorded  Sensorium  Memory: Recent Good  Judgment: Impaired  Insight: Fair   G[employer]er recorded Attention Span:No data recorded Recall:No data recorded [employer] of Knowledge:No data recorded Language:No data recorded  Psychomotor Activity  Psychomotor Activity:No data recorded Musculoskeletal: Strength & Muscle Tone: within normal limits Gait & Station: normal Assets  Assets:No data recorded   Physical  Exam: Physical Exam ROS Blood pressure (!) 117/52, pulse 83, temperature 97.7 F (36.5 C), temperature source Oral, resp. rate 12, height 6' 4 (1.93 m), weight 80.7 kg, SpO2 100%. Body mass index is 21.67 kg/m.  Diagnosis: Principal Problem:   Schizophrenia (HCC)   PLAN: Safety and Monitoring:  -- Voluntary admission to inpatient psychiatric unit for safety, stabilization and treatment  -- Daily contact with patient to assess and evaluate symptoms and progress in treatment  -- Patient's case to be discussed in multi-disciplinary team meeting  -- Observation Level : q15 minute checks  -- Vital signs:  q12 hours  -- Precautions: suicide, elopement, and assault -- Encouraged patient to participate in unit milieu and in scheduled group therapies  2. Psychiatric Diagnoses and Treatment:  Patient requires inpatient psychiatric treatment related to thoughts to harm self in the context of decompensated mood and increased psychosis. Presentation is consistent with historical diagnosis of Schizoaffective Disorder so will carry forward.        3. Medical Issues Being Addressed: Nutrition consult initiated related to weight loss     4. Discharge Planning:   -- Social work and case management to assist with discharge planning and identification of hospital follow-up needs prior to discharge  -- Estimated LOS: 3-4 days  Angelia Barcelona, NP 12/21/2023, 11:51 AM

## 2023-12-21 NOTE — Group Note (Signed)
 Date:  12/21/2023 Time:  9:54 PM  Group Topic/Focus:  Wellness Toolbox:   The focus of this group is to discuss various aspects of wellness, balancing those aspects and exploring ways to increase the ability to experience wellness.  Patients will create a wellness toolbox for use upon discharge.    Participation Level:  Active  Participation Quality:  Appropriate  Affect:  Appropriate  Cognitive:  Appropriate  Insight: Appropriate  Engagement in Group:  Limited  Modes of Intervention:  Discussion  Additional Comments:      [facility] 12/21/2023, 9:54 PM

## 2023-12-21 NOTE — Progress Notes (Signed)
 [facility] MD Progress Note  12/21/2023 12:23 PM Derrick Atkins  MRN:  [id number]   Derrick Atkins is a 53yo male with a past psychiatric history of schizophrenia, major depressive disorder, adjustment disorder who presented to the emergency department via law enforcement voluntarily with concerns of worsening depression, hallucinations and thoughts to self-harm.   Per chart review patient voluntarily reported to the hospital with report of chronic pain and difficulty making friends at [facility] as well as experiencing auditory hallucinations that put him down.  He reported hearing these voices daily and has been ongoing for the last 2 years since his mother passed away he also endorsed visions and inability to read people's minds  Subjective:  Chart reviewed, case discussed in multidisciplinary meeting, patient seen during rounds.   Patient seen today for follow-up psychiatric evaluation.  He reports increased depression today. He is laying in hospital bed with rather blunted affect. He reports there have been some AH today. He reports continues to sleep better, we discuss plan to further increase Zyprexa . Patient denies SI/HI and reports he is getting along well in this setting. Per SW he cannot return to [facility] and is not clear whether pt is aware of this or not. Will follow up with Lipid panel , TSH And Vit D. Have also ordered nutrition consult related to recent weight loss and poor appetite.    12/20/23:He is noted to be walking in the hall without distress and he is agreeable for interview.  In reviewing events leading to this admission he reports not wanting [employer] and finding it difficult to make friends in that setting.  He is currently very calm with no behaviors so this provider asked what prompted inappropriate and aggressive behaviors leading to this discharge to where he states I do not know I just blow up he denies any severe decompensation in mood or increase in  psychotic symptoms stating I did not what came over may I really just do not like it there.  In review of psychiatric history patient reports many inpatient psychiatric admissions and long history of psychotic illness where he I heard voices all the time has been pretty bad.  We reviewed current medications as have received list from outpatient psych.  Reviewed currently prescribed Zyprexa  10 mg at night which he reports that really helped me sleep a lot I can tell a big difference.  Patient reports trouble with sleeping has been ongoing in the outpatient setting as well as poor appetite some increase in depression.  We reviewed plan to hold Haldol  5 mg by mouth twice daily at this time in favor of using Zyprexa  as it is effective antipsychotic as well as has not side effect of providing for sleep.  He verbalizes understanding and agrees with this plan of care. Patient denies AVH paranoia and delusions. He denies SI/HI.   Sleep: Good  Appetite:  Fair  Past Psychiatric History: see h&P Family History:  Family History  Problem Relation Age of Onset   Heart disease Father    Heart attack Father    Social History:  Social History   Substance and Sexual Activity  Alcohol  Use No     Social History   Substance and Sexual Activity  Drug Use No    Social History   Socioeconomic History   Marital status: Single    Spouse name: Not on file   Number of children: Not on file   Years of education: Not on file  Highest education level: Not on file  Occupational History   Not on file  Tobacco Use   Smoking status: Never    Passive exposure: Never   Smokeless tobacco: Never  Vaping Use   Vaping status: Never Used  Substance and Sexual Activity   Alcohol  use: No   Drug use: No   Sexual activity: Not Currently  Other Topics Concern   Not on file  Social History Narrative   Not on file   Social Drivers of Health   Financial Resource Strain: Not on file  Food Insecurity: No Food  Insecurity (12/18/2023)   Hunger Vital Sign    Worried About Running Out of Food in the Last Year: Never true    Ran Out of Food in the Last Year: Never true  Transportation Needs: Unmet Transportation Needs (12/18/2023)   PRAPARE - Administrator, Civil Service (Medical): Yes    Lack of Transportation (Non-Medical): Yes  Physical Activity: Not on file  Stress: Not on file  Social Connections: Not on file   Past Medical History:  Past Medical History:  Diagnosis Date   Cerebral palsy (HCC)    Depression    Diabetes mellitus without complication (HCC)    Hypertension    Kidney stones    Schizoaffective disorder (HCC)    Scoliosis     Past Surgical History:  Procedure Laterality Date   BOWEL RESECTION     CATARACT EXTRACTION W/PHACO Right 09/15/2022   Procedure: CATARACT EXTRACTION PHACO AND INTRAOCULAR LENS PLACEMENT (IOC) RIGHT DIABETIC VISION BLUE HEALON 5  15.79  01:31.9;  Surgeon: Annell Kidney, MD;  Location: [facility];  Service: Ophthalmology;  Laterality: Right;  Latex Diabetic   INCISION AND DRAINAGE ABSCESS Left 12/20/2022   Procedure: INCISION AND DRAINAGE ABSCESS;  Surgeon: Flynn Hylan, MD;  Location: [facility];  Service: General;  Laterality: Left;   LAPAROTOMY N/A 05/15/2020   Procedure: EXPLORATORY LAPAROTOMY;  Surgeon: Emmalene Hare, MD;  Location: [facility];  Service: General;  Laterality: N/A;    Current Medications: Current Facility-Administered Medications  Medication Dose Route Frequency Provider Last Rate Last Admin   acetaminophen  (TYLENOL ) tablet 1,000 mg  1,000 mg Oral Q6H PRN Mills, Shnese E, NP   1,000 mg at 12/20/23 2241   albuterol  (VENTOLIN  HFA) 108 (90 Base) MCG/ACT inhaler 2 puff  2 puff Inhalation Q4H PRN Mills, Shnese E, NP       alum & mag hydroxide-simeth (MAALOX/MYLANTA) 200-200-20 MG/5ML suspension 30 mL  30 mL Oral Q4H PRN Mills, Shnese E, NP       ascorbic acid  (VITAMIN C ) tablet 500 mg  500 mg Oral Daily Mills,  Shnese E, NP   500 mg at 12/21/23 0826   aspirin  EC tablet 81 mg  81 mg Oral Daily Mills, Shnese E, NP   81 mg at 12/21/23 0824   benztropine  (COGENTIN ) tablet 1 mg  1 mg Oral Daily Mills, Shnese E, NP   1 mg at 12/21/23 0825   busPIRone  (BUSPAR ) tablet 15 mg  15 mg Oral TID Mills, Shnese E, NP   15 mg at 12/21/23 0825   cholecalciferol  (VITAMIN D3) 25 MCG (1000 UNIT) tablet 1,000 Units  1,000 Units Oral Daily Mills, Shnese E, NP   1,000 Units at 12/21/23 0825   cyanocobalamin (VITAMIN B12) tablet 1,000 mcg  1,000 mcg Oral Daily Mills, Shnese E, NP   1,000 mcg at 12/21/23 0829   feeding supplement (ENSURE PLUS HIGH PROTEIN) liquid 237 mL  237 mL Oral TID BM Jadapalle, Sree, MD   237 mL at 12/21/23 0846   fenofibrate  tablet 54 mg  54 mg Oral Daily Mills, Shnese E, NP   54 mg at 12/21/23 0829   FLUoxetine (PROZAC) capsule 20 mg  20 mg Oral Daily Mills, Shnese E, NP   20 mg at 12/21/23 0825   folic acid  (FOLVITE ) tablet 1 mg  1 mg Oral Daily Mills, Shnese E, NP   1 mg at 12/21/23 8295   hydrOXYzine  (ATARAX ) tablet 10 mg  10 mg Oral Daily Mills, Shnese E, NP   10 mg at 12/21/23 6213   hydrOXYzine  (ATARAX ) tablet 25 mg  25 mg Oral TID PRN Mills, Shnese E, NP       iron  polysaccharides (NIFEREX) capsule 150 mg  150 mg Oral Daily Mills, Shnese E, NP   150 mg at 12/21/23 0865   lamoTRIgine  (LAMICTAL ) tablet 25 mg  25 mg Oral BID Angelia Barcelona, NP   25 mg at 12/21/23 0826   levothyroxine  (SYNTHROID ) tablet 88 mcg  88 mcg Oral Q0600 Mills, Shnese E, NP   88 mcg at 12/21/23 7846   linaclotide (LINZESS) capsule 290 mcg  290 mcg Oral Daily Mills, Shnese E, NP   290 mcg at 12/21/23 9629   magnesium  hydroxide (MILK OF MAGNESIA) suspension 30 mL  30 mL Oral Daily PRN Mills, Shnese E, NP       metoprolol  tartrate (LOPRESSOR ) tablet 25 mg  25 mg Oral BID Mills, Shnese E, NP   25 mg at 12/21/23 5284   multivitamin with minerals tablet 1 tablet  1 tablet Oral Daily Jadapalle, Sree, MD   1 tablet at 12/21/23 1324    OLANZapine  (ZYPREXA ) injection 10 mg  10 mg Intramuscular TID PRN Mills, Shnese E, NP       OLANZapine  (ZYPREXA ) injection 5 mg  5 mg Intramuscular TID PRN Mills, Shnese E, NP       OLANZapine  (ZYPREXA ) tablet 15 mg  15 mg Oral QHS Angelia Barcelona, NP       OLANZapine  zydis (ZYPREXA ) disintegrating tablet 5 mg  5 mg Oral TID PRN Mills, Shnese E, NP       pantoprazole  (PROTONIX ) EC tablet 40 mg  40 mg Oral QAC breakfast Mills, Shnese E, NP   40 mg at 12/21/23 0826   polyethylene glycol (MIRALAX  / GLYCOLAX ) packet 17 g  17 g Oral Daily Mills, Shnese E, NP   17 g at 12/20/23 0827   potassium chloride  SA (KLOR-CON  M) CR tablet 20 mEq  20 mEq Oral Daily Mills, Shnese E, NP   20 mEq at 12/21/23 4010   rosuvastatin  (CRESTOR ) tablet 5 mg  5 mg Oral Daily Mills, Shnese E, NP   5 mg at 12/20/23 0827   senna (SENOKOT) tablet 17.2 mg  2 tablet Oral QHS Mills, Shnese E, NP   17.2 mg at 12/20/23 2241    Lab Results: No results found for this or any previous visit (from the past 48 hours).  Blood Alcohol  level:  Lab Results  Component Value Date   [facility] <15 12/17/2023   ETH <15 11/03/2023    Metabolic Disorder Labs: Lab Results  Component Value Date   HGBA1C 5.1 04/15/2023   MPG 99.67 04/15/2023   MPG 88 12/13/2022   No results found for: PROLACTIN Lab Results  Component Value Date   CHOL 95 03/25/2022   TRIG 52 03/25/2022   HDL 42 03/25/2022   CHOLHDL  2.3 03/25/2022   VLDL 10 03/25/2022   LDLCALC 43 03/25/2022   LDLCALC 42 02/08/2022     Psychiatric Specialty Exam:  Presentation  General Appearance:  Appropriate for Environment  Eye Contact: Fair  Speech: Slow  Speech Volume: Decreased    Mood and Affect  Mood: Depressed  Affect: Flat   Thought Process  Thought Processes: Linear  Descriptions of Associations:Intact  Orientation:Full (Time, Place and Person)  Thought Content:Logical  Hallucinations:No data recorded Ideas of Reference:No data  recorded Suicidal Thoughts:No data recorded Homicidal Thoughts:No data recorded  Sensorium  Memory: Recent Good  Judgment: Impaired  Insight: Fair   Art therapist  Concentration:No data recorded Attention Span:No data recorded Recall:No data recorded Fund of Knowledge:No data recorded Language:No data recorded  Psychomotor Activity  Psychomotor Activity:No data recorded Musculoskeletal: Strength & Muscle Tone: within normal limits Gait & Station: normal Assets  Assets:No data recorded   Physical Exam: Physical Exam ROS Blood pressure (!) 117/52, pulse 83, temperature 97.7 F (36.5 C), temperature source Oral, resp. rate 12, height 6' 4 (1.93 m), weight 80.7 kg, SpO2 100%. Body mass index is 21.67 kg/m.  Diagnosis: Principal Problem:   Schizophrenia (HCC)   PLAN: Safety and Monitoring:  -- Voluntary admission to inpatient psychiatric unit for safety, stabilization and treatment  -- Daily contact with patient to assess and evaluate symptoms and progress in treatment  -- Patient's case to be discussed in multi-disciplinary team meeting  -- Observation Level : q15 minute checks  -- Vital signs:  q12 hours  -- Precautions: suicide, elopement, and assault -- Encouraged patient to participate in unit milieu and in scheduled group therapies  2. Psychiatric Diagnoses and Treatment:  Patient requires inpatient psychiatric treatment related to thoughts to harm self in the context of decompensated mood and increased psychosis. Presentation is consistent with historical diagnosis of Schizoaffective Disorder so will carry forward.        3. Medical Issues Being Addressed: Nutrition consult initiated related to weight loss     4. Discharge Planning:   -- Social work and case management to assist with discharge planning and identification of hospital follow-up needs prior to discharge  -- Estimated LOS: 3-4 days  Angelia Barcelona, NP 12/21/2023, 12:23 PM

## 2023-12-21 NOTE — Plan of Care (Signed)
   Problem: Education: Goal: Emotional status will improve Outcome: Progressing

## 2023-12-21 NOTE — Group Note (Signed)
 Date:  12/21/2023 Time:  10:37 AM  Group Topic/Focus:  Emotional Education:   The focus of this group is to discuss what feelings/emotions are, and how they are experienced. Healthy Communication:   The focus of this group is to discuss communication, barriers to communication, as well as healthy ways to communicate with others. Personal Choices and Values:   The focus of this group is to help patients assess and explore the importance of values in their lives, how their values affect their decisions, how they express their values and what opposes their expression.    Participation Level:  Did Not Attend  Participation Quality:    Affect:    Cognitive:    Insight:   Engagement in Group:    Modes of Intervention:    Additional Comments:    Eveleigh Crumpler 12/21/2023, 10:37 AM

## 2023-12-22 LAB — LIPID PANEL
Cholesterol: 117 mg/dL (ref 0–200)
HDL: 62 mg/dL (ref >40)
LDL Cholesterol: 46 mg/dL (ref 0–99)
Total CHOL/HDL Ratio: 1.9 ratio
Triglycerides: 45 mg/dL (ref <150)
VLDL: 9 mg/dL (ref 0–40)

## 2023-12-22 LAB — TSH: TSH: 6.133 u[IU]/mL — ABNORMAL HIGH (ref 0.350–4.500)

## 2023-12-22 NOTE — Progress Notes (Signed)
   12/22/23 1000  Psych Admission Type (Psych Patients Only)  Admission Status Voluntary  Psychosocial Assessment  Patient Complaints Other (Comment) (somatic)  Eye Contact Fair  Facial Expression Sullen  Affect Labile  Speech Logical/coherent  Interaction Assertive  Motor Activity Slow  Appearance/Hygiene In scrubs  Behavior Characteristics Cooperative  Mood Anxious  Thought Process  Coherency WDL  Content WDL  Delusions None reported or observed  Perception WDL  Hallucination None reported or observed  Judgment WDL  Confusion None  Danger to Self  Current suicidal ideation? Denies  Self-Injurious Behavior No self-injurious ideation or behavior indicators observed or expressed   Agreement Not to Harm Self Yes  Description of Agreement verbal

## 2023-12-22 NOTE — Group Note (Signed)
 Date:  12/22/2023 Time:  10:14 AM  Group Topic/Focus:  Goals Group:   The focus of this group is to help patients establish daily goals to achieve during treatment and discuss how the patient can incorporate goal setting into their daily lives to aide in recovery.    Participation Level:  Active  Participation Quality:  Appropriate  Affect:  Appropriate  Cognitive:  Appropriate  Insight: Appropriate  Engagement in Group:  Engaged  Modes of Intervention:  Discussion, Education, and Support  Additional Comments:     Derrick Atkins 12/22/2023, 10:14 AM

## 2023-12-22 NOTE — Group Note (Signed)
 Date:  12/22/2023 Time:  8:58 PM  Group Topic/Focus:  Identifying Needs:   The focus of this group is to help patients identify their personal needs that have been historically problematic and identify healthy behaviors to address their needs.  MHT made introduction and discussed expections for the night. MHT explained there were 15 minute checks and techs would be as quite as possible, not to disturb anyone's sleep. MHT explained 15 minute checks were required so do not be alarmed if they wake up and someone is looking into the room. MHT explained the 15 minute check was to ensure they were breathing and some rooms are dark, so it takes a minute for eyes to adjust to see breathing. MHT reassured patients that would be the reason for a prolonged look in their rooms.  MHT discussed resources available upon discharge. MHT informed of the peer living room located at [employer]. MHT explained this was a free service available to all members in the community. MHT informed the peer living room was a place they could access the internet, get help completing applications, looking for housing, or other resources in the community. MHT reminded it was a free service and someone was always there to assist.  MHT informed of the 211 call line for resources. MHT offered to call to show how to utilize the line, but patients stated they were familiar with. MHT informed if they were looking for resources such as food, assistance with housing, medical care, etc they could call the 211 resource line. MHT explained they would provide the caller with resources available within a 100 mile radius.  MHT opened floor for discussion and answered questions as needed.  Participation Level:  Active  Participation Quality:  Appropriate  Affect:  Appropriate  Cognitive:  Appropriate  Insight: Appropriate  Engagement in Group:  Engaged  Modes of Intervention:  Discussion and Education  Additional Comments:    Titus Formosa 12/22/2023, 8:58 PM

## 2023-12-22 NOTE — Plan of Care (Signed)
   Problem: Education: Goal: Knowledge of [place] [facility] Education information/materials will improve Outcome: Progressing Goal: Emotional status will improve Outcome: Progressing

## 2023-12-22 NOTE — Group Note (Signed)
 Recreation Therapy Group Note   Group Topic:Health and Wellness  Group Date: 12/22/2023 Start Time: 1000 End Time: 1055 Facilitators: Deatrice Factor, LRT, CTRS Location: Courtyard  Group Description: [employer]. LRT and patients played games of basketball, drew with chalk, and played corn hole while outside in the courtyard while getting fresh air and sunlight. Music was being played in the background. LRT and peers conversed about different games they have played before, what they do in their free time and anything else that is on their minds. LRT encouraged pts to drink water after being outside, sweating and getting their heart rate up.  Goal Area(s) Addressed: Patient will build on frustration tolerance skills. Patients will partake in a competitive play game with peers. Patients will gain knowledge of new leisure interest/hobby.   Affect/Mood: Appropriate   Participation Level: Active and Engaged   Participation Quality: Independent   Behavior: Appropriate, Calm, and Cooperative   Speech/Thought Process: Coherent   Insight: Good   Judgement: Good   Modes of Intervention: Exploration, Music, Rapport Building, and Socialization   Patient Response to Interventions:  Attentive, Engaged, Interested , and Receptive   Education Outcome:  Acknowledges education   Clinical Observations/Individualized Feedback: Derrick Atkins was active in their participation of session activities and group discussion. Pt interacted well with LRT and peers duration of session. Pt shared different types of music he likes and shared that he has been studying music most of his life.   Plan: Continue to engage patient in RT group sessions 2-3x/week.   Deatrice Factor, LRT, CTRS 12/22/2023 1:24 PM

## 2023-12-22 NOTE — Group Note (Signed)
 Date:  12/22/2023 Time:  5:18 PM  Group Topic/Focus:  Wellness Toolbox:   The focus of this group is to discuss various aspects of wellness, balancing those aspects and exploring ways to increase the ability to experience wellness.  Patients will create a wellness toolbox for use upon discharge.    Participation Level:  Active  Participation Quality:  Appropriate  Affect:  Appropriate  Cognitive:  Appropriate  Insight: Appropriate  Engagement in Group:  Engaged  Modes of Intervention:  Activity and Socialization  Additional Comments:    Laverne Potter 12/22/2023, 5:18 PM

## 2023-12-22 NOTE — Progress Notes (Signed)
   12/21/23 1944  Psych Admission Type (Psych Patients Only)  Admission Status Voluntary  Psychosocial Assessment  Eye Contact Fair  Facial Expression Sad  Affect Sad  Speech Logical/coherent  Interaction Assertive  Motor Activity Slow  Appearance/Hygiene Disheveled  Behavior Characteristics Cooperative  Mood Sad  Thought Process  Coherency WDL  Content WDL  Delusions None reported or observed  Perception WDL  Hallucination None reported or observed  Judgment WDL  Confusion None  Danger to Self  Current suicidal ideation? Passive  Self-Injurious Behavior No self-injurious ideation or behavior indicators observed or expressed   Agreement Not to Harm Self Yes  Danger to Others  Danger to Others None reported or observed

## 2023-12-22 NOTE — Group Note (Signed)
 [facility] LCSW Group Therapy Note   Group Date: 12/22/2023 Start Time: 1300 End Time: 1330  Type of Therapy and Topic:  Group Therapy:  Feelings around Relapse and Recovery  Participation Level:  Minimal    Description of Group:    Patients in this group will discuss emotions they experience before and after a relapse. They will process how experiencing these feelings, or avoidance of experiencing them, relates to having a relapse. Facilitator will guide patients to explore emotions they have related to recovery. Patients will be encouraged to process which emotions are more powerful. They will be guided to discuss the emotional reaction significant others in their lives may have to patients' relapse or recovery. Patients will be assisted in exploring ways to respond to the emotions of others without this contributing to a relapse.  Therapeutic Goals: Patient will identify two or more emotions that lead to relapse for them:  Patient will identify two emotions that result when they relapse:  Patient will identify two emotions related to recovery:  Patient will demonstrate ability to communicate their needs through discussion and/or role plays.   Summary of Patient Progress: Patient was present for the entirety of the group process. Although he did not participate in the icebreaker, pt did share that one finds out who your real friends are when going through hard times. Patient appeared to attend to the conversation.    Therapeutic Modalities:   Cognitive Behavioral Therapy Solution-Focused Therapy Assertiveness Training Relapse Prevention Therapy   Randolm Butte, LCSW

## 2023-12-23 NOTE — Group Note (Signed)
 Recreation Therapy Group Note   Group Topic:Leisure Education  Group Date: 12/23/2023 Start Time: 1045 End Time: 1145 Facilitators: Deatrice Factor, LRT, CTRS Location: Craft Room  Group Description: Leisure. Patients were given the option to choose from singing karaoke, coloring mandalas, using oil pastels, journaling, or playing with play-doh. LRT and pts discussed the meaning of leisure, the importance of participating in leisure during their free time/when they're outside of the hospital, as well as how our leisure interests can also serve as coping skills.   Goal Area(s) Addressed:  Patient will identify a current leisure interest.  Patient will learn the definition of "leisure". Patient will practice making a positive decision. Patient will have the opportunity to try a new leisure activity. Patient will communicate with peers and LRT.    Affect/Mood: Appropriate   Participation Level: Active and Engaged   Participation Quality: Independent   Behavior: Appropriate, Calm, and Cooperative   Speech/Thought Process: Coherent   Insight: Good   Judgement: Good   Modes of Intervention: Clarification, Education, Exploration, and Music   Patient Response to Interventions:  Attentive, Engaged, Interested , and Receptive   Education Outcome:  Acknowledges education   Clinical Observations/Individualized Feedback: Derrick Atkins was active in their participation of session activities and group discussion. Pt identified play video games and listen to music as things he does in his free time. Pt chose to listen to music while in group. Pt interacted well with LRT and peers duration of session.    Plan: Continue to engage patient in RT group sessions 2-3x/week.   Deatrice Factor, LRT, CTRS 12/23/2023 1:14 PM

## 2023-12-23 NOTE — Progress Notes (Signed)
   12/23/23 1100  Psych Admission Type (Psych Patients Only)  Admission Status Voluntary  Psychosocial Assessment  Patient Complaints Anxiety  Eye Contact Fair  Facial Expression Sullen  Affect Sad  Speech Logical/coherent  Interaction Assertive  Motor Activity Slow  Appearance/Hygiene In scrubs  Behavior Characteristics Appropriate to situation;Cooperative  Mood Anxious  Thought Process  Coherency WDL  Content WDL  Delusions None reported or observed  Perception WDL  Hallucination None reported or observed  Judgment WDL  Confusion WDL  Danger to Self  Current suicidal ideation? Denies

## 2023-12-23 NOTE — Plan of Care (Signed)
   Problem: Education: Goal: Knowledge of [facility] Education information/materials will improve Outcome: Progressing Goal: Emotional status will improve Outcome: Progressing Goal: Mental status will improve Outcome: Progressing

## 2023-12-23 NOTE — Progress Notes (Signed)
   12/23/23 2200  Psych Admission Type (Psych Patients Only)  Admission Status Voluntary  Psychosocial Assessment  Patient Complaints Anxiety  Eye Contact Fair  Facial Expression Sullen  Affect Sad  Speech Logical/coherent  Interaction Assertive  Motor Activity Slow  Appearance/Hygiene In scrubs  Behavior Characteristics Appropriate to situation  Mood Anxious  Thought Process  Coherency WDL  Content WDL  Delusions None reported or observed  Perception WDL  Hallucination None reported or observed  Judgment WDL  Confusion WDL  Danger to Self  Current suicidal ideation? Denies  Danger to Others  Danger to Others None reported or observed

## 2023-12-23 NOTE — Plan of Care (Signed)
   Problem: Education: Goal: Knowledge of [place] [facility] Education information/materials will improve Outcome: Progressing Goal: Emotional status will improve Outcome: Progressing

## 2023-12-23 NOTE — Group Note (Signed)
 Date:  12/23/2023 Time:  8:44 PM  Group Topic/Focus:  Orientation:   The focus of this group is to educate the patient on the purpose and policies of crisis stabilization and provide a format to answer questions about their admission.  The group details unit policies and expectations of patients while admitted.    Participation Level:  Active  Participation Quality:  Appropriate, Attentive, and Sharing  Affect:  Appropriate  Cognitive:  Alert and Appropriate  Insight: Appropriate and Good  Engagement in Group:  Developing/Improving and Engaged  Modes of Intervention:  Discussion, Education, Orientation, Rapport Building, and Support  Additional Comments:     Bladen Umar 12/23/2023, 8:44 PM

## 2023-12-23 NOTE — Progress Notes (Signed)
   12/22/23 2010  Psych Admission Type (Psych Patients Only)  Admission Status Voluntary  Psychosocial Assessment  Patient Complaints None  Eye Contact Fair  Facial Expression Sad  Affect Sad  Speech Logical/coherent  Interaction Assertive  Motor Activity Slow  Appearance/Hygiene Disheveled  Behavior Characteristics Cooperative  Mood Anxious  Thought Process  Coherency WDL  Content WDL  Delusions None reported or observed  Perception WDL  Hallucination None reported or observed  Judgment WDL  Confusion None  Danger to Self  Current suicidal ideation? Passive  Self-Injurious Behavior No self-injurious ideation or behavior indicators observed or expressed   Agreement Not to Harm Self Yes  Danger to Others  Danger to Others None reported or observed

## 2023-12-23 NOTE — Plan of Care (Signed)
   Problem: Education: Goal: Emotional status will improve Outcome: Progressing   Problem: Education: Goal: Mental status will improve Outcome: Progressing

## 2023-12-23 NOTE — Group Note (Signed)
 Date:  12/23/2023 Time:  6:48 PM  Group Topic/Focus:  Dimensions of Wellness:   The focus of this group is to introduce the topic of wellness and discuss the role each dimension of wellness plays in total health.    Participation Level:  Active  Participation Quality:  Appropriate  Affect:  Appropriate  Cognitive:  Appropriate  Insight: Appropriate  Engagement in Group:  Engaged  Modes of Intervention:  Activity and Socialization  Additional Comments:    Laverne Potter 12/23/2023, 6:48 PM

## 2023-12-24 DIAGNOSIS — F200 Paranoid schizophrenia: Secondary | ICD-10-CM | POA: Diagnosis not present

## 2023-12-24 NOTE — Plan of Care (Signed)
  Problem: Education: Goal: Knowledge of [facility] Education information/materials will improve Outcome: Progressing Goal: Emotional status will improve Outcome: Progressing Goal: Mental status will improve Outcome: Progressing Goal: Verbalization of understanding the information provided will improve Outcome: Progressing   Problem: Activity: Goal: Interest or engagement in activities will improve Outcome: Progressing Goal: Sleeping patterns will improve Outcome: Progressing   Problem: Coping: Goal: Ability to verbalize frustrations and anger appropriately will improve Outcome: Progressing Goal: Ability to demonstrate self-control will improve Outcome: Progressing   Problem: Health Behavior/Discharge Planning: Goal: Identification of resources available to assist in meeting health care needs will improve Outcome: Progressing Goal: Compliance with treatment plan for underlying cause of condition will improve Outcome: Progressing   Problem: Physical Regulation: Goal: Ability to maintain clinical measurements within normal limits will improve Outcome: Progressing   Problem: Safety: Goal: Periods of time without injury will increase Outcome: Progressing   Problem: Education: Goal: Utilization of techniques to improve thought processes will improve Outcome: Progressing Goal: Knowledge of the prescribed therapeutic regimen will improve Outcome: Progressing   Problem: Activity: Goal: Interest or engagement in leisure activities will improve Outcome: Progressing Goal: Imbalance in normal sleep/wake cycle will improve Outcome: Progressing   Problem: Coping: Goal: Coping ability will improve Outcome: Progressing Goal: Will verbalize feelings Outcome: Progressing   Problem: Health Behavior/Discharge Planning: Goal: Ability to make decisions will improve Outcome: Progressing Goal: Compliance with therapeutic regimen will improve Outcome: Progressing    Problem: Role Relationship: Goal: Will demonstrate positive changes in social behaviors and relationships Outcome: Progressing   Problem: Safety: Goal: Ability to disclose and discuss suicidal ideas will improve Outcome: Progressing Goal: Ability to identify and utilize support systems that promote safety will improve Outcome: Progressing   Problem: Self-Concept: Goal: Will verbalize positive feelings about self Outcome: Progressing Goal: Level of anxiety will decrease Outcome: Progressing   Problem: Education: Goal: Ability to make informed decisions regarding treatment will improve Outcome: Progressing   Problem: Coping: Goal: Coping ability will improve Outcome: Progressing   Problem: Health Behavior/Discharge Planning: Goal: Identification of resources available to assist in meeting health care needs will improve Outcome: Progressing   Problem: Medication: Goal: Compliance with prescribed medication regimen will improve Outcome: Progressing   Problem: Self-Concept: Goal: Ability to disclose and discuss suicidal ideas will improve Outcome: Progressing Goal: Will verbalize positive feelings about self Outcome: Progressing Note: Patient is on track. Patient will maintain adherence

## 2023-12-24 NOTE — Progress Notes (Signed)
 Patient just reported to this writer that he is feeling better today, I'm not depressed.

## 2023-12-24 NOTE — Progress Notes (Signed)
   12/24/23 0810  Psych Admission Type (Psych Patients Only)  Admission Status Voluntary  Psychosocial Assessment  Patient Complaints Anxiety  Eye Contact Fair  Facial Expression Sullen;Flat  Affect Sad  Speech Logical/coherent  Interaction Assertive  Motor Activity Slow  Appearance/Hygiene In scrubs  Behavior Characteristics Cooperative;Appropriate to situation  Mood Anxious  Aggressive Behavior  Effect No apparent injury  Thought Process  Coherency WDL  Content WDL  Delusions None reported or observed  Perception WDL  Hallucination None reported or observed  Judgment WDL  Confusion WDL  Danger to Self  Current suicidal ideation? Denies  Self-Injurious Behavior No self-injurious ideation or behavior indicators observed or expressed   Agreement Not to Harm Self Yes  Description of Agreement verbal  Danger to Others  Danger to Others None reported or observed

## 2023-12-24 NOTE — Progress Notes (Addendum)
 [employer] MD Progress Note  12/24/2023 11:51 AM Derrick Atkins  MRN:  [id number]   Derrick Atkins is a 53yo male with a past psychiatric history of schizophrenia, major depressive disorder, adjustment disorder who presented to the emergency department via law enforcement voluntarily with concerns of worsening depression, hallucinations and thoughts to self-harm.   Per chart review patient voluntarily reported to the hospital with report of chronic pain and difficulty making friends at [facility] as well as experiencing auditory hallucinations that put him down.  He reported hearing these voices daily and has been ongoing for the last 2 years since his mother passed away he also endorsed visions and inability to read people's minds  Subjective:  Chart reviewed, case discussed in multidisciplinary meeting, patient seen during rounds.  Patient reports in rounds I am doing better and this provider notes more relaxed and brighter affect. He reports  I slept better that is making a difference. We review medication changes and he denies any further AH.  Patient is doing well with Zyprexa  15mg  at bedtime. This provider discontinued Haldol  5mg  BID scheduled on admission in favor of monotherapy. He denies suicidal ideation/homicidal ideation.  He is aware unable to return to [facility] and will be awaiting placement options.    Sleep: Good  Appetite:  Fair  Past Psychiatric History: see h&P Family History:  Family History  Problem Relation Age of Onset   Heart disease Father    Heart attack Father    Social History:  Social History   Substance and Sexual Activity  Alcohol  Use No     Social History   Substance and Sexual Activity  Drug Use No    Social History   Socioeconomic History   Marital status: Single    Spouse name: Not on file   Number of children: Not on file   Years of education: Not on file   Highest education level: Not on file  Occupational History   Not on file   Tobacco Use   Smoking status: Never    Passive exposure: Never   Smokeless tobacco: Never  Vaping Use   Vaping status: Never Used  Substance and Sexual Activity   Alcohol  use: No   Drug use: No   Sexual activity: Not Currently  Other Topics Concern   Not on file  Social History Narrative   Not on file   Social Drivers of Health   Financial Resource Strain: Not on file  Food Insecurity: No Food Insecurity (12/18/2023)   Hunger Vital Sign    Worried About Running Out of Food in the Last Year: Never true    Ran Out of Food in the Last Year: Never true  Transportation Needs: Unmet Transportation Needs (12/18/2023)   PRAPARE - Administrator, Civil Service (Medical): Yes    Lack of Transportation (Non-Medical): Yes  Physical Activity: Not on file  Stress: Not on file  Social Connections: Not on file   Past Medical History:  Past Medical History:  Diagnosis Date   Cerebral palsy (HCC)    Depression    Diabetes mellitus without complication (HCC)    Hypertension    Kidney stones    Schizoaffective disorder (HCC)    Scoliosis     Past Surgical History:  Procedure Laterality Date   BOWEL RESECTION     CATARACT EXTRACTION W/PHACO Right 09/15/2022   Procedure: CATARACT EXTRACTION PHACO AND INTRAOCULAR LENS PLACEMENT (IOC) RIGHT DIABETIC VISION BLUE HEALON 5  15.79  01:31.9;  Surgeon: Annell Kidney, MD;  Location: [facility];  Service: Ophthalmology;  Laterality: Right;  Latex Diabetic   INCISION AND DRAINAGE ABSCESS Left 12/20/2022   Procedure: INCISION AND DRAINAGE ABSCESS;  Surgeon: Flynn Hylan, MD;  Location: [facility];  Service: General;  Laterality: Left;   LAPAROTOMY N/A 05/15/2020   Procedure: EXPLORATORY LAPAROTOMY;  Surgeon: Emmalene Hare, MD;  Location: [facility];  Service: General;  Laterality: N/A;    Current Medications: Current Facility-Administered Medications  Medication Dose Route Frequency Provider Last Rate Last Admin    acetaminophen  (TYLENOL ) tablet 1,000 mg  1,000 mg Oral Q6H PRN Mills, Shnese E, NP   1,000 mg at 12/24/23 0917   albuterol  (VENTOLIN  HFA) 108 (90 Base) MCG/ACT inhaler 2 puff  2 puff Inhalation Q4H PRN Mills, Shnese E, NP       alum & mag hydroxide-simeth (MAALOX/MYLANTA) 200-200-20 MG/5ML suspension 30 mL  30 mL Oral Q4H PRN Mills, Shnese E, NP       ascorbic acid  (VITAMIN C ) tablet 500 mg  500 mg Oral Daily Mills, Shnese E, NP   500 mg at 12/24/23 0810   aspirin  EC tablet 81 mg  81 mg Oral Daily Mills, Shnese E, NP   81 mg at 12/24/23 3086   benztropine  (COGENTIN ) tablet 1 mg  1 mg Oral Daily Mills, Shnese E, NP   1 mg at 12/24/23 0810   busPIRone  (BUSPAR ) tablet 15 mg  15 mg Oral TID Mills, Shnese E, NP   15 mg at 12/24/23 0810   cholecalciferol  (VITAMIN D3) 25 MCG (1000 UNIT) tablet 1,000 Units  1,000 Units Oral Daily Mills, Shnese E, NP   1,000 Units at 12/24/23 0810   cyanocobalamin  (VITAMIN B12) tablet 1,000 mcg  1,000 mcg Oral Daily Mills, Shnese E, NP   1,000 mcg at 12/24/23 0815   feeding supplement (ENSURE PLUS HIGH PROTEIN) liquid 237 mL  237 mL Oral TID BM Jadapalle, Sree, MD   237 mL at 12/23/23 2126   fenofibrate  tablet 54 mg  54 mg Oral Daily Mills, Shnese E, NP   54 mg at 12/24/23 0815   FLUoxetine  (PROZAC ) capsule 20 mg  20 mg Oral Daily Mills, Shnese E, NP   20 mg at 12/24/23 0811   folic acid  (FOLVITE ) tablet 1 mg  1 mg Oral Daily Mills, Shnese E, NP   1 mg at 12/24/23 0817   hydrOXYzine  (ATARAX ) tablet 10 mg  10 mg Oral Daily Mills, Shnese E, NP   10 mg at 12/24/23 0809   hydrOXYzine  (ATARAX ) tablet 25 mg  25 mg Oral TID PRN Mills, Shnese E, NP   25 mg at 12/23/23 1712   iron  polysaccharides (NIFEREX) capsule 150 mg  150 mg Oral Daily Mills, Shnese E, NP   150 mg at 12/24/23 0819   lamoTRIgine  (LAMICTAL ) tablet 25 mg  25 mg Oral BID Angelia Barcelona, NP   25 mg at 12/24/23 0810   levothyroxine  (SYNTHROID ) tablet 88 mcg  88 mcg Oral Q0600 Mills, Shnese E, NP   88 mcg at  12/24/23 0641   linaclotide  (LINZESS ) capsule 290 mcg  290 mcg Oral Daily Mills, Shnese E, NP   290 mcg at 12/24/23 5784   magnesium  hydroxide (MILK OF MAGNESIA) suspension 30 mL  30 mL Oral Daily PRN Mills, Shnese E, NP       metoprolol  tartrate (LOPRESSOR ) tablet 25 mg  25 mg Oral BID Mills, Shnese E, NP   25 mg at 12/24/23  2956   multivitamin with minerals tablet 1 tablet  1 tablet Oral Daily Jadapalle, Sree, MD   1 tablet at 12/24/23 0810   OLANZapine  (ZYPREXA ) injection 10 mg  10 mg Intramuscular TID PRN Mills, Shnese E, NP       OLANZapine  (ZYPREXA ) injection 5 mg  5 mg Intramuscular TID PRN Mills, Shnese E, NP       OLANZapine  (ZYPREXA ) tablet 15 mg  15 mg Oral QHS Angelia Barcelona, NP   15 mg at 12/23/23 2117   OLANZapine  zydis (ZYPREXA ) disintegrating tablet 5 mg  5 mg Oral TID PRN Mills, Shnese E, NP       pantoprazole  (PROTONIX ) EC tablet 40 mg  40 mg Oral QAC breakfast Mills, Shnese E, NP   40 mg at 12/24/23 0810   polyethylene glycol (MIRALAX  / GLYCOLAX ) packet 17 g  17 g Oral Daily Mills, Shnese E, NP   17 g at 12/24/23 0809   potassium chloride  SA (KLOR-CON  M) CR tablet 20 mEq  20 mEq Oral Daily Mills, Shnese E, NP   20 mEq at 12/24/23 2130   rosuvastatin  (CRESTOR ) tablet 5 mg  5 mg Oral Daily Mills, Shnese E, NP   5 mg at 12/24/23 8657   senna (SENOKOT) tablet 17.2 mg  2 tablet Oral QHS Mills, Shnese E, NP   17.2 mg at 12/23/23 2117    Lab Results:  No results found for this or any previous visit (from the past 48 hours).   Blood Alcohol  level:  Lab Results  Component Value Date   [facility] <15 12/17/2023   ETH <15 11/03/2023    Metabolic Disorder Labs: Lab Results  Component Value Date   HGBA1C 5.1 04/15/2023   MPG 99.67 04/15/2023   MPG 88 12/13/2022   No results found for: PROLACTIN Lab Results  Component Value Date   CHOL 117 12/22/2023   TRIG 45 12/22/2023   HDL 62 12/22/2023   CHOLHDL 1.9 12/22/2023   VLDL 9 12/22/2023   LDLCALC 46 12/22/2023   LDLCALC  43 03/25/2022     Psychiatric Specialty Exam:  Presentation  General Appearance:  Appropriate for Environment  Eye Contact: Fair  Speech: Slow  Speech Volume: Decreased    Mood and Affect  Mood:-improved today Depressed  Affect: Flat   Thought Process  Thought Processes: Linear  Descriptions of Associations:Intact  Orientation:Full (Time, Place and Person)  Thought Content:Logical  Hallucinations: denies  Ideas of Reference:denies Suicidal Thoughts:denies Homicidal Thoughts:denies  Sensorium  Memory: Recent Good  Judgment: Impaired  Insight: Fair   Chief Operating Officer Attention Span:fair Recall:fair Fund of Knowledge:fair Language:fair  Psychomotor Activity  Psychomotor Activity:NL Musculoskeletal: Strength & Muscle Tone: within normal limits Gait & Station: normal Assets  Assets:resilience   Physical Exam: Physical Exam ROS Blood pressure 126/73, pulse 71, temperature 97.9 F (36.6 C), temperature source Oral, resp. rate 16, height 6' 4 (1.93 m), weight 80.7 kg, SpO2 100%. Body mass index is 21.67 kg/m.  Diagnosis: Principal Problem:   Schizophrenia (HCC)   PLAN: Safety and Monitoring:  -- Voluntary admission to inpatient psychiatric unit for safety, stabilization and treatment  -- Daily contact with patient to assess and evaluate symptoms and progress in treatment  -- Patient's case to be discussed in multi-disciplinary team meeting  -- Observation Level : q15 minute checks  -- Vital signs:  q12 hours  -- Precautions: suicide, elopement, and assault -- Encouraged patient to participate in unit milieu and in scheduled group therapies  2. Psychiatric Diagnoses and Treatment:  Zyprexa  15 mg at bedtime Prozac  20 mg daily Buspar  15 mg TID       3. Medical Issues Being Addressed: Nutrition consult initiated related to weight loss     4. Discharge Planning:   -- Social work and case management to  assist with discharge planning and identification of hospital follow-up needs prior to discharge  -- Estimated LOS: 3-4 days  Angelia Barcelona, NP 12/24/2023, 11:51 AM

## 2023-12-24 NOTE — Group Note (Signed)
 Date:  12/24/2023 Time:  6:12 PM  Group Topic/Focus:  Wellness Toolbox:   The focus of this group is to discuss various aspects of wellness, balancing those aspects and exploring ways to increase the ability to experience wellness.  Patients will create a wellness toolbox for use upon discharge.    Participation Level:  Active  Participation Quality:  Appropriate  Affect:  Appropriate  Cognitive:  Appropriate  Insight: Appropriate  Engagement in Group:  Engaged  Modes of Intervention:  Activity and Socialization  Additional Comments:    Laverne Potter 12/24/2023, 6:12 PM

## 2023-12-24 NOTE — Group Note (Signed)
 Date:  12/24/2023 Time:  11:18 AM  Group Topic/Focus:  Goals Group:   The focus of this group is to help patients establish daily goals to achieve during treatment and discuss how the patient can incorporate goal setting into their daily lives to aide in recovery.    Participation Level:  Active  Participation Quality:  Appropriate  Affect:  Appropriate  Cognitive:  Appropriate  Insight: Appropriate  Engagement in Group:  Engaged  Modes of Intervention:  Discussion, Education, and Support  Additional Comments:    Laverne Potter 12/24/2023, 11:18 AM

## 2023-12-24 NOTE — BH IP Treatment Plan (Signed)
 Interdisciplinary Treatment and Diagnostic Plan Update  12/24/2023 Time of Session: 9:40 am Derrick Atkins MRN: [id number]  Principal Diagnosis: Schizophrenia [facility])  Secondary Diagnoses: Principal Problem:   Schizophrenia (HCC)   Current Medications:  Current Facility-Administered Medications  Medication Dose Route Frequency Provider Last Rate Last Admin   acetaminophen  (TYLENOL ) tablet 1,000 mg  1,000 mg Oral Q6H PRN Mills, Shnese E, NP   1,000 mg at 12/24/23 0917   albuterol  (VENTOLIN  HFA) 108 (90 Base) MCG/ACT inhaler 2 puff  2 puff Inhalation Q4H PRN Mills, Shnese E, NP       alum & mag hydroxide-simeth (MAALOX/MYLANTA) 200-200-20 MG/5ML suspension 30 mL  30 mL Oral Q4H PRN Mills, Shnese E, NP       ascorbic acid  (VITAMIN C ) tablet 500 mg  500 mg Oral Daily Mills, Shnese E, NP   500 mg at 12/24/23 0810   aspirin  EC tablet 81 mg  81 mg Oral Daily Mills, Shnese E, NP   81 mg at 12/24/23 9147   benztropine  (COGENTIN ) tablet 1 mg  1 mg Oral Daily Mills, Shnese E, NP   1 mg at 12/24/23 0810   busPIRone  (BUSPAR ) tablet 15 mg  15 mg Oral TID Mills, Shnese E, NP   15 mg at 12/24/23 0810   cholecalciferol  (VITAMIN D3) 25 MCG (1000 UNIT) tablet 1,000 Units  1,000 Units Oral Daily Mills, Shnese E, NP   1,000 Units at 12/24/23 0810   cyanocobalamin  (VITAMIN B12) tablet 1,000 mcg  1,000 mcg Oral Daily Mills, Shnese E, NP   1,000 mcg at 12/24/23 0815   feeding supplement (ENSURE PLUS HIGH PROTEIN) liquid 237 mL  237 mL Oral TID BM Jadapalle, Sree, MD   237 mL at 12/23/23 2126   fenofibrate  tablet 54 mg  54 mg Oral Daily Mills, Shnese E, NP   54 mg at 12/24/23 0815   FLUoxetine  (PROZAC ) capsule 20 mg  20 mg Oral Daily Mills, Shnese E, NP   20 mg at 12/24/23 0811   folic acid  (FOLVITE ) tablet 1 mg  1 mg Oral Daily Mills, Shnese E, NP   1 mg at 12/24/23 0817   hydrOXYzine  (ATARAX ) tablet 10 mg  10 mg Oral Daily Mills, Shnese E, NP   10 mg at 12/24/23 0809   hydrOXYzine  (ATARAX ) tablet 25 mg  25 mg  Oral TID PRN Mills, Shnese E, NP   25 mg at 12/23/23 1712   iron  polysaccharides (NIFEREX) capsule 150 mg  150 mg Oral Daily Mills, Shnese E, NP   150 mg at 12/24/23 8295   lamoTRIgine  (LAMICTAL ) tablet 25 mg  25 mg Oral BID Angelia Barcelona, NP   25 mg at 12/24/23 0810   levothyroxine  (SYNTHROID ) tablet 88 mcg  88 mcg Oral Q0600 Doneen Fuelling, NP   88 mcg at 12/24/23 0641   linaclotide  (LINZESS ) capsule 290 mcg  290 mcg Oral Daily Mills, Shnese E, NP   290 mcg at 12/24/23 6213   magnesium  hydroxide (MILK OF MAGNESIA) suspension 30 mL  30 mL Oral Daily PRN Mills, Shnese E, NP       metoprolol  tartrate (LOPRESSOR ) tablet 25 mg  25 mg Oral BID Mills, Shnese E, NP   25 mg at 12/24/23 0865   multivitamin with minerals tablet 1 tablet  1 tablet Oral Daily Jadapalle, Sree, MD   1 tablet at 12/24/23 0810   OLANZapine  (ZYPREXA ) injection 10 mg  10 mg Intramuscular TID PRN Doneen Fuelling, NP  OLANZapine  (ZYPREXA ) injection 5 mg  5 mg Intramuscular TID PRN Mills, Shnese E, NP       OLANZapine  (ZYPREXA ) tablet 15 mg  15 mg Oral QHS Angelia Barcelona, NP   15 mg at 12/23/23 2117   OLANZapine  zydis (ZYPREXA ) disintegrating tablet 5 mg  5 mg Oral TID PRN Mills, Shnese E, NP       pantoprazole  (PROTONIX ) EC tablet 40 mg  40 mg Oral QAC breakfast Mills, Shnese E, NP   40 mg at 12/24/23 0810   polyethylene glycol (MIRALAX  / GLYCOLAX ) packet 17 g  17 g Oral Daily Mills, Shnese E, NP   17 g at 12/24/23 0809   potassium chloride  SA (KLOR-CON  M) CR tablet 20 mEq  20 mEq Oral Daily Orvil Bland E, NP   20 mEq at 12/24/23 6387   rosuvastatin  (CRESTOR ) tablet 5 mg  5 mg Oral Daily Mills, Shnese E, NP   5 mg at 12/24/23 5643   senna (SENOKOT) tablet 17.2 mg  2 tablet Oral QHS Mills, Shnese E, NP   17.2 mg at 12/23/23 2117   PTA Medications: Medications Prior to Admission  Medication Sig Dispense Refill Last Dose/Taking   acetaminophen  (TYLENOL ) 500 MG tablet Take 1,000 mg by mouth 3 (three) times daily as  needed for mild pain or moderate pain.   Taking As Needed   ascorbic acid  (VITAMIN C ) 500 MG tablet Take 1 tablet (500 mg total) by mouth daily. 30 tablet 3 Past Week   aspirin  EC 81 MG tablet Take 1 tablet (81 mg total) by mouth daily. Swallow whole. 30 tablet 12 Past Week   benztropine  (COGENTIN ) 1 MG tablet Take 1 tablet (1 mg total) by mouth daily. 30 tablet 1 Past Week   busPIRone  (BUSPAR ) 15 MG tablet Take 15 mg by mouth 2 (two) times daily.   Past Week   cholecalciferol  (VITAMIN D3) 25 MCG (1000 UNIT) tablet Take 1,000 Units by mouth daily.   Past Week   Cyanocobalamin  (B-12) 1000 MCG CAPS Take 1 capsule by mouth daily.   Past Week   fenofibrate  54 MG tablet Take 54 mg by mouth daily.   Past Week   FLUoxetine  (PROZAC ) 20 MG capsule Take 20 mg by mouth daily.   Past Week   gemfibrozil  (LOPID ) 600 MG tablet Take 600 mg by mouth 2 (two) times daily before a meal.   Past Week   haloperidol  (HALDOL ) 5 MG tablet Take 5 mg by mouth 2 (two) times daily.   Past Week   hydrOXYzine  (ATARAX ) 10 MG tablet Take 10 mg by mouth daily. At noon   Past Week   iron  polysaccharides (NIFEREX) 150 MG capsule Take 1 capsule (150 mg total) by mouth daily. 30 capsule 3 Past Week   lamoTRIgine  (LAMICTAL ) 25 MG tablet Take 25 mg by mouth 2 (two) times daily.   Past Week   levothyroxine  (SYNTHROID ) 88 MCG tablet Take 1 tablet (88 mcg total) by mouth daily at 6 (six) AM. 30 tablet 1 Past Week   LINZESS  290 MCG CAPS capsule Take 290 mcg by mouth daily.   Past Week   loperamide  (IMODIUM ) 2 MG capsule Take 4 mg by mouth 2 (two) times daily.   Taking   OLANZapine  (ZYPREXA ) 7.5 MG tablet Take 3.75 mg by mouth at bedtime.   Past Week   pantoprazole  (PROTONIX ) 40 MG tablet Take 1 tablet (40 mg total) by mouth daily before breakfast. 30 tablet 0 Past Week   polyethylene  glycol (MIRALAX  / GLYCOLAX ) 17 g packet Take 17 g by mouth daily.   Past Week   potassium chloride  SA (KLOR-CON  M) 20 MEQ tablet Take 20 mEq by mouth daily.    Past Week   rosuvastatin  (CRESTOR ) 5 MG tablet Take 5 mg by mouth daily.   Past Week   senna (SENOKOT) 8.6 MG TABS tablet Take 2 tablets by mouth at bedtime.   Past Week   VENTOLIN  HFA 108 (90 Base) MCG/ACT inhaler Inhale into the lungs every 4 (four) hours as needed.   Taking As Needed   ABILIFY  MAINTENA 400 MG PRSY prefilled syringe Inject 400 mg into the muscle every 28 (twenty-eight) days.      folic acid  (FOLVITE ) 1 MG tablet Take 1 tablet (1 mg total) by mouth daily. (Patient not taking: Reported on 12/19/2023) 30 tablet 3 Not Taking   metoprolol  tartrate (LOPRESSOR ) 25 MG tablet Take 1 tablet (25 mg total) by mouth 2 (two) times daily. (Patient not taking: Reported on 12/19/2023) 60 tablet 2 Not Taking   OLANZapine  (ZYPREXA ) 10 MG tablet Take 10 mg by mouth at bedtime. (Take with 20mg  tablet to equal 30mg  total) (Patient not taking: Reported on 11/03/2023)      OLANZapine  (ZYPREXA ) 20 MG tablet Take 1 tablet (20 mg total) by mouth at bedtime. (Patient not taking: Reported on 12/18/2023) 30 tablet 1    OLANZapine  (ZYPREXA ) 5 MG tablet Add to your existing 20 mg dose of olanzapine  for nightly dose of 25 mg total (Patient not taking: Reported on 11/03/2023) 30 tablet 2     Patient Stressors:    Patient Strengths:    Treatment Modalities: Medication Management, Group therapy, Case management,  1 to 1 session with clinician, Psychoeducation, Recreational therapy.   Physician Treatment Plan for Primary Diagnosis: Schizophrenia (HCC) Long Term Goal(s): Improvement in symptoms so as ready for discharge   Short Term Goals: Ability to identify changes in lifestyle to reduce recurrence of condition will improve  Medication Management: Evaluate patient's response, side effects, and tolerance of medication regimen.  Therapeutic Interventions: 1 to 1 sessions, Unit Group sessions and Medication administration.  Evaluation of Outcomes: Progressing  Physician Treatment Plan for Secondary Diagnosis:  Principal Problem:   Schizophrenia (HCC)  Long Term Goal(s): Improvement in symptoms so as ready for discharge   Short Term Goals: Ability to identify changes in lifestyle to reduce recurrence of condition will improve     Medication Management: Evaluate patient's response, side effects, and tolerance of medication regimen.  Therapeutic Interventions: 1 to 1 sessions, Unit Group sessions and Medication administration.  Evaluation of Outcomes: Progressing   RN Treatment Plan for Primary Diagnosis: Schizophrenia (HCC) Long Term Goal(s): Knowledge of disease and therapeutic regimen to maintain health will improve  Short Term Goals: Ability to remain free from injury will improve, Ability to verbalize frustration and anger appropriately will improve, Ability to demonstrate self-control, Ability to participate in decision making will improve, Ability to verbalize feelings will improve, Ability to disclose and discuss suicidal ideas, Ability to identify and develop effective coping behaviors will improve, and Compliance with prescribed medications will improve  Medication Management: RN will administer medications as ordered by provider, will assess and evaluate patient's response and provide education to patient for prescribed medication. RN will report any adverse and/or side effects to prescribing provider.  Therapeutic Interventions: 1 on 1 counseling sessions, Psychoeducation, Medication administration, Evaluate responses to treatment, Monitor vital signs and CBGs as ordered, Perform/monitor CIWA, COWS, AIMS and Fall  Risk screenings as ordered, Perform wound care treatments as ordered.  Evaluation of Outcomes: Progressing   LCSW Treatment Plan for Primary Diagnosis: Schizophrenia (HCC) Long Term Goal(s): Safe transition to appropriate next level of care at discharge, Engage patient in therapeutic group addressing interpersonal concerns.  Short Term Goals: Engage patient in aftercare  planning with referrals and resources, Increase social support, Increase ability to appropriately verbalize feelings, Increase emotional regulation, Facilitate acceptance of mental health diagnosis and concerns, and Increase skills for wellness and recovery  Therapeutic Interventions: Assess for all discharge needs, 1 to 1 time with Social worker, Explore available resources and support systems, Assess for adequacy in community support network, Educate family and significant other(s) on suicide prevention, Complete Psychosocial Assessment, Interpersonal group therapy.  Evaluation of Outcomes: Progressing   Progress in Treatment: Attending groups: Yes. and No. Participating in groups: Yes. Taking medication as prescribed: Yes. Toleration medication: Yes. Family/Significant other contact made: Yes, individual(s) contacted:   Coralee Derby, guardian [facility] of Social Services, [phone] Patient understands diagnosis: Yes. Discussing patient identified problems/goals with staff: Yes. Medical problems stabilized or resolved: Yes. Denies suicidal/homicidal ideation: Yes. Issues/concerns per patient self-inventory: No. Other: None  New problem(s) identified: No, Describe:  none  Update 12/24/2023: No changes at this time.   New Short Term/Long Term Goal(s): detox, elimination of symptoms of psychosis, medication management for mood stabilization; elimination of SI thoughts; development of comprehensive mental wellness/sobriety plan.  Update 12/24/2023: No changes at this time.   Patient Goals:  get a good nights rest  Update 12/24/2023: No changes at this time.   Discharge Plan or Barriers: CSW to assist in the development of appropriate discharge plans.  Patient reports a desire to find a new placement.  CSW to reach out to the patient's guardian.    Update 12/24/2023: CSW and DSS are looking into placement and possible group homes for the Pt.    Reason for Continuation of  Hospitalization: Anxiety Depression Medication stabilization Suicidal ideation   Estimated Length of Stay:  1-7 days Update 12/24/2023: TBD   Last 3 [place] Suicide Severity Risk Score: Flowsheet Row Admission (Current) from 12/18/2023 in [facility] INPATIENT BEHAV[facility]AL MEDICINE ED from 12/17/2023 in [facility] Emergency Department at [facility] ED from 11/30/2023 in [facility] Emergency Department at [facility]  C-SSRS RISK CATEGORY Low Risk  [Simultaneous filing. User may not have seen previous data.] High Risk No Risk    Last PHQ 2/9 Scores:     No data to display          Scribe for Treatment Team: Santina Cull, LCSW 12/24/2023 9:42 AM

## 2023-12-24 NOTE — Progress Notes (Signed)
 [facility] MD Progress Note  12/24/2023 12:46 AM Derrick Atkins  MRN:  [id number]   Derrick Atkins is a 53yo male with a past psychiatric history of schizophrenia, major depressive disorder, adjustment disorder who presented to the emergency department via law enforcement voluntarily with concerns of worsening depression, hallucinations and thoughts to self-harm.   Per chart review patient voluntarily reported to the hospital with report of chronic pain and difficulty making friends at [facility] as well as experiencing auditory hallucinations that put him down.  He reported hearing these voices daily and has been ongoing for the last 2 years since his mother passed away he also endorsed visions and inability to read people's minds  Subjective:  Chart reviewed, case discussed in multidisciplinary meeting, patient seen during rounds.  Patient is noted to be outdoors doing the recreational therapy he reports being upset about being moved from the group home to group home.  He reports chronic depression 10 out of 10 and is not able to identify the trigger.  He continues to endorse chronic auditory hallucinations of whispers.  He reports that he was at [facility] house but he has to move again.  He reports anxiety as 8 out of 10, 10 being the worst.  He denies suicidal ideation.  He reports that his guardian told him that he will be staying in the hospital for a long time until next placement.   Sleep: Good  Appetite:  Fair  Past Psychiatric History: see h&P Family History:  Family History  Problem Relation Age of Onset   Heart disease Father    Heart attack Father    Social History:  Social History   Substance and Sexual Activity  Alcohol  Use No     Social History   Substance and Sexual Activity  Drug Use No    Social History   Socioeconomic History   Marital status: Single    Spouse name: Not on file   Number of children: Not on file   Years of education: Not on file   Highest  education level: Not on file  Occupational History   Not on file  Tobacco Use   Smoking status: Never    Passive exposure: Never   Smokeless tobacco: Never  Vaping Use   Vaping status: Never Used  Substance and Sexual Activity   Alcohol  use: No   Drug use: No   Sexual activity: Not Currently  Other Topics Concern   Not on file  Social History Narrative   Not on file   Social Drivers of Health   Financial Resource Strain: Not on file  Food Insecurity: No Food Insecurity (12/18/2023)   Hunger Vital Sign    Worried About Running Out of Food in the Last Year: Never true    Ran Out of Food in the Last Year: Never true  Transportation Needs: Unmet Transportation Needs (12/18/2023)   PRAPARE - Administrator, Civil Service (Medical): Yes    Lack of Transportation (Non-Medical): Yes  Physical Activity: Not on file  Stress: Not on file  Social Connections: Not on file   Past Medical History:  Past Medical History:  Diagnosis Date   Cerebral palsy (HCC)    Depression    Diabetes mellitus without complication (HCC)    Hypertension    Kidney stones    Schizoaffective disorder (HCC)    Scoliosis     Past Surgical History:  Procedure Laterality Date   BOWEL RESECTION  CATARACT EXTRACTION W/PHACO Right 09/15/2022   Procedure: CATARACT EXTRACTION PHACO AND INTRAOCULAR LENS PLACEMENT (IOC) RIGHT DIABETIC VISION BLUE HEALON 5  15.79  01:31.9;  Surgeon: Annell Kidney, MD;  Location: [facility];  Service: Ophthalmology;  Laterality: Right;  Latex Diabetic   INCISION AND DRAINAGE ABSCESS Left 12/20/2022   Procedure: INCISION AND DRAINAGE ABSCESS;  Surgeon: Flynn Hylan, MD;  Location: [facility];  Service: General;  Laterality: Left;   LAPAROTOMY N/A 05/15/2020   Procedure: EXPLORATORY LAPAROTOMY;  Surgeon: Emmalene Hare, MD;  Location: [facility] OR[facility];  Service: General;  Laterality: N/A;    Current Medications: Current Facility-Administered Medications   Medication Dose Route Frequency Provider Last Rate Last Admin   acetaminophen  (TYLENOL ) tablet 1,000 mg  1,000 mg Oral Q6H PRN Mills, Shnese E, NP   1,000 mg at 12/23/23 2126   albuterol  (VENTOLIN  HFA) 108 (90 Base) MCG/ACT inhaler 2 puff  2 puff Inhalation Q4H PRN Mills, Shnese E, NP       alum & mag hydroxide-simeth (MAALOX/MYLANTA) 200-200-20 MG/5ML suspension 30 mL  30 mL Oral Q4H PRN Mills, Shnese E, NP       ascorbic acid  (VITAMIN C ) tablet 500 mg  500 mg Oral Daily Mills, Shnese E, NP   500 mg at 12/23/23 0844   aspirin  EC tablet 81 mg  81 mg Oral Daily Mills, Shnese E, NP   81 mg at 12/23/23 0844   benztropine  (COGENTIN ) tablet 1 mg  1 mg Oral Daily Mills, Shnese E, NP   1 mg at 12/23/23 0844   busPIRone  (BUSPAR ) tablet 15 mg  15 mg Oral TID Mills, Shnese E, NP   15 mg at 12/23/23 1712   cholecalciferol  (VITAMIN D3) 25 MCG (1000 UNIT) tablet 1,000 Units  1,000 Units Oral Daily Mills, Shnese E, NP   1,000 Units at 12/23/23 0845   cyanocobalamin  (VITAMIN B12) tablet 1,000 mcg  1,000 mcg Oral Daily Mills, Shnese E, NP   1,000 mcg at 12/23/23 0846   feeding supplement (ENSURE PLUS HIGH PROTEIN) liquid 237 mL  237 mL Oral TID BM Jaivian Battaglini, MD   237 mL at 12/23/23 2126   fenofibrate  tablet 54 mg  54 mg Oral Daily Mills, Shnese E, NP   54 mg at 12/23/23 0845   FLUoxetine  (PROZAC ) capsule 20 mg  20 mg Oral Daily Mills, Shnese E, NP   20 mg at 12/23/23 0844   folic acid  (FOLVITE ) tablet 1 mg  1 mg Oral Daily Mills, Shnese E, NP   1 mg at 12/23/23 0844   hydrOXYzine  (ATARAX ) tablet 10 mg  10 mg Oral Daily Mills, Shnese E, NP   10 mg at 12/23/23 0844   hydrOXYzine  (ATARAX ) tablet 25 mg  25 mg Oral TID PRN Mills, Shnese E, NP   25 mg at 12/23/23 1712   iron  polysaccharides (NIFEREX) capsule 150 mg  150 mg Oral Daily Mills, Shnese E, NP   150 mg at 12/23/23 0846   lamoTRIgine  (LAMICTAL ) tablet 25 mg  25 mg Oral BID Angelia Barcelona, NP   25 mg at 12/23/23 1712   levothyroxine  (SYNTHROID )  tablet 88 mcg  88 mcg Oral Q0600 Mills, Shnese E, NP   88 mcg at 12/23/23 0600   linaclotide  (LINZESS ) capsule 290 mcg  290 mcg Oral Daily Mills, Shnese E, NP   290 mcg at 12/23/23 0845   magnesium  hydroxide (MILK OF MAGNESIA) suspension 30 mL  30 mL Oral Daily PRN Mills, Shnese E, NP  metoprolol  tartrate (LOPRESSOR ) tablet 25 mg  25 mg Oral BID Mills, Shnese E, NP   25 mg at 12/23/23 1712   multivitamin with minerals tablet 1 tablet  1 tablet Oral Daily Isidor Bromell, MD   1 tablet at 12/23/23 0844   OLANZapine  (ZYPREXA ) injection 10 mg  10 mg Intramuscular TID PRN Mills, Shnese E, NP       OLANZapine  (ZYPREXA ) injection 5 mg  5 mg Intramuscular TID PRN Mills, Shnese E, NP       OLANZapine  (ZYPREXA ) tablet 15 mg  15 mg Oral QHS Angelia Barcelona, NP   15 mg at 12/23/23 2117   OLANZapine  zydis (ZYPREXA ) disintegrating tablet 5 mg  5 mg Oral TID PRN Mills, Shnese E, NP       pantoprazole  (PROTONIX ) EC tablet 40 mg  40 mg Oral QAC breakfast Mills, Shnese E, NP   40 mg at 12/23/23 0844   polyethylene glycol (MIRALAX  / GLYCOLAX ) packet 17 g  17 g Oral Daily Mills, Shnese E, NP   17 g at 12/22/23 4098   potassium chloride  SA (KLOR-CON  M) CR tablet 20 mEq  20 mEq Oral Daily Mills, Shnese E, NP   20 mEq at 12/23/23 0844   rosuvastatin  (CRESTOR ) tablet 5 mg  5 mg Oral Daily Mills, Shnese E, NP   5 mg at 12/23/23 0845   senna (SENOKOT) tablet 17.2 mg  2 tablet Oral QHS Mills, Shnese E, NP   17.2 mg at 12/23/23 2117    Lab Results:  Results for orders placed or performed during the hospital encounter of 12/18/23 (from the past 48 hours)  TSH     Status: Abnormal   Collection Time: 12/22/23  7:10 AM  Result Value Ref Range   TSH 6.133 (H) 0.350 - 4.500 uIU/mL    Comment: Performed by a 3rd Generation assay with a functional sensitivity of <=0.01 uIU/mL. Performed at [facility], [address] [place], [place] [address]   Lipid panel     Status: None   Collection Time: 12/22/23   7:10 AM  Result Value Ref Range   Cholesterol 117 0 - 200 mg/dL   Triglycerides 45 <782 mg/dL   HDL 62 >95 mg/dL   Total CHOL/HDL Ratio 1.9 RATIO   VLDL 9 0 - 40 mg/dL   LDL Cholesterol 46 0 - 99 mg/dL    Comment:        Total Cholesterol/HDL:CHD Risk Coronary Heart Disease Risk Table                     Men   Women  1/2 Average Risk   3.4   3.3  Average Risk       5.0   4.4  2 X Average Risk   9.6   7.1  3 X Average Risk  23.4   11.0        Use the calculated Patient Ratio above and the CHD Risk Table to determine the patient's CHD Risk.        ATP III CLASSIFICATION (LDL):  <100     mg/dL   Optimal  621-308  mg/dL   Near or Above                    Optimal  130-159  mg/dL   Borderline  657-846  mg/dL   High  >962     mg/dL   Very High Performed at [facility], [address] [place], [place] [address]     Blood Alcohol  level:  Lab Results  Component Value Date   [facility] <15 12/17/2023   ETH <15 11/03/2023    Metabolic Disorder Labs: Lab Results  Component Value Date   HGBA1C 5.1 04/15/2023   MPG 99.67 04/15/2023   MPG 88 12/13/2022   No results found for: PROLACTIN Lab Results  Component Value Date   CHOL 117 12/22/2023   TRIG 45 12/22/2023   HDL 62 12/22/2023   CHOLHDL 1.9 12/22/2023   VLDL 9 12/22/2023   LDLCALC 46 12/22/2023   LDLCALC 43 03/25/2022     Psychiatric Specialty Exam:  Presentation  General Appearance:  Appropriate for Environment  Eye Contact: Fair  Speech: Slow  Speech Volume: Decreased    Mood and Affect  Mood: Depressed  Affect: Flat   Thought Process  Thought Processes: Linear  Descriptions of Associations:Intact  Orientation:Full (Time, Place and Person)  Thought Content:Logical  Hallucinations: auditory whispers Ideas of Reference:denies Suicidal Thoughts:denies Homicidal Thoughts:denies  Sensorium  Memory: Recent Good  Judgment: Impaired  Insight: Fair   Quarry manager Attention Span:fair Recall:fair Fund of Knowledge:fair Language:fair  Psychomotor Activity  Psychomotor Activity:NL Musculoskeletal: Strength & Muscle Tone: within normal limits Gait & Station: normal Assets  Assets:resilience   Physical Exam: Physical Exam ROS Blood pressure 136/71, pulse 79, temperature 98 F (36.7 C), temperature source Oral, resp. rate 17, height 6' 4 (1.93 m), weight 80.7 kg, SpO2 100%. Body mass index is 21.67 kg/m.  Diagnosis: Principal Problem:   Schizophrenia (HCC)   PLAN: Safety and Monitoring:  -- Voluntary admission to inpatient psychiatric unit for safety, stabilization and treatment  -- Daily contact with patient to assess and evaluate symptoms and progress in treatment  -- Patient's case to be discussed in multi-disciplinary team meeting  -- Observation Level : q15 minute checks  -- Vital signs:  q12 hours  -- Precautions: suicide, elopement, and assault -- Encouraged patient to participate in unit milieu and in scheduled group therapies  2. Psychiatric Diagnoses and Treatment:  Zyprexa  15 mg at bedtime Prozac  20 mg daily Buspar  15 mg TID       3. Medical Issues Being Addressed: Nutrition consult initiated related to weight loss     4. Discharge Planning:   -- Social work and case management to assist with discharge planning and identification of hospital follow-up needs prior to discharge  -- Estimated LOS: 3-4 days  Aurelia Blotter, MD 12/24/2023, 12:46 AM

## 2023-12-25 NOTE — Plan of Care (Signed)

## 2023-12-25 NOTE — Plan of Care (Signed)
  Problem: Education: Goal: Emotional status will improve Outcome: Not Progressing Goal: Mental status will improve Outcome: Not Progressing   Problem: Activity: Goal: Interest or engagement in activities will improve Outcome: Progressing   Problem: Coping: Goal: Ability to verbalize frustrations and anger appropriately will improve Outcome: Progressing   Problem: Health Behavior/Discharge Planning: Goal: Compliance with treatment plan for underlying cause of condition will improve Outcome: Progressing   Problem: Physical Regulation: Goal: Ability to maintain clinical measurements within normal limits will improve Outcome: Progressing   Problem: Safety: Goal: Periods of time without injury will increase Outcome: Progressing

## 2023-12-25 NOTE — Progress Notes (Signed)
   12/25/23 1100  Psych Admission Type (Psych Patients Only)  Admission Status Voluntary  Psychosocial Assessment  Patient Complaints Anxiety;Depression;Worrying  Eye Contact Fair  Facial Expression Sullen;Worried  Affect Sullen  Systems analyst;Slow  Interaction Minimal  Motor Activity Slow  Appearance/Hygiene Unremarkable  Behavior Characteristics Cooperative  Mood Depressed;Apprehensive;Sullen  Thought Process  Coherency WDL  Content WDL  Delusions None reported or observed  Perception WDL  Hallucination None reported or observed  Judgment Limited  Confusion None  Danger to Self  Current suicidal ideation? Passive  Self-Injurious Behavior No self-injurious ideation or behavior indicators observed or expressed   Agreement Not to Harm Self Yes  Description of Agreement Verbal  Danger to Others  Danger to Others None reported or observed

## 2023-12-25 NOTE — Progress Notes (Signed)
 [facility] MD Progress Note  12/25/2023 8:47 AM Derrick Atkins  MRN:  [id number]   Derrick Atkins is a 53yo male with a past psychiatric history of schizophrenia, major depressive disorder, adjustment disorder who presented to the emergency department via law enforcement voluntarily with concerns of worsening depression, hallucinations and thoughts to self-harm.   Per chart review patient voluntarily reported to the hospital with report of chronic pain and difficulty making friends at [facility] as well as experiencing auditory hallucinations that put him down.  He reported hearing these voices daily and has been ongoing for the last 2 years since his mother passed away he also endorsed visions and inability to read people's minds  Subjective:  Chart reviewed, case discussed in multidisciplinary meeting, patient seen during rounds.  Patient engaged for rounds.  He has noted to not make eye contact with this provider which is not his normal.  In assessing symptoms he declines to elaborate checking his head.  In assessing for physical symptoms patient shakes his head no, he denies any thoughts to harm himself or anyone else, he denies hallucinations paranoia or delusions.  He is present for breakfast.  This provider asked related to his ankle which he states it is getting better.  Reviewed information patient injured ankle several months ago and injury has been x-rayed several times by foot specialist, he denies any new injury.  Mood is dysphoric today with blunted affect however patient is unable to verbalize why we will continue to monitor for symptoms.  He is not appearing psychotic.  There are no self-harm or aggressive behaviors noted. Review medication changes and he denies any further AH.  Patient is doing well with Zyprexa  15mg  at bedtime. This provider discontinued Haldol  5mg  BID scheduled on admission in favor of monotherapy. He denies suicidal ideation/homicidal ideation.  He is aware unable to  return to [facility] and will be awaiting placement options.    Sleep: Good  Appetite:  Fair  Past Psychiatric History: see h&P Family History:  Family History  Problem Relation Age of Onset   Heart disease Father    Heart attack Father    Social History:  Social History   Substance and Sexual Activity  Alcohol  Use No     Social History   Substance and Sexual Activity  Drug Use No    Social History   Socioeconomic History   Marital status: Single    Spouse name: Not on file   Number of children: Not on file   Years of education: Not on file   Highest education level: Not on file  Occupational History   Not on file  Tobacco Use   Smoking status: Never    Passive exposure: Never   Smokeless tobacco: Never  Vaping Use   Vaping status: Never Used  Substance and Sexual Activity   Alcohol  use: No   Drug use: No   Sexual activity: Not Currently  Other Topics Concern   Not on file  Social History Narrative   Not on file   Social Drivers of Health   Financial Resource Strain: Not on file  Food Insecurity: No Food Insecurity (12/18/2023)   Hunger Vital Sign    Worried About Running Out of Food in the Last Year: Never true    Ran Out of Food in the Last Year: Never true  Transportation Needs: Unmet Transportation Needs (12/18/2023)   PRAPARE - Transportation    Lack of Transportation (Medical): Yes    Lack  of Transportation (Non-Medical): Yes  Physical Activity: Not on file  Stress: Not on file  Social Connections: Not on file   Past Medical History:  Past Medical History:  Diagnosis Date   Cerebral palsy (HCC)    Depression    Diabetes mellitus without complication (HCC)    Hypertension    Kidney stones    Schizoaffective disorder (HCC)    Scoliosis     Past Surgical History:  Procedure Laterality Date   BOWEL RESECTION     CATARACT EXTRACTION W/PHACO Right 09/15/2022   Procedure: CATARACT EXTRACTION PHACO AND INTRAOCULAR LENS PLACEMENT (IOC) RIGHT  DIABETIC VISION BLUE HEALON 5  15.79  01:31.9;  Surgeon: Annell Kidney, MD;  Location: [facility];  Service: Ophthalmology;  Laterality: Right;  Latex Diabetic   INCISION AND DRAINAGE ABSCESS Left 12/20/2022   Procedure: INCISION AND DRAINAGE ABSCESS;  Surgeon: Flynn Hylan, MD;  Location: [facility];  Service: General;  Laterality: Left;   LAPAROTOMY N/A 05/15/2020   Procedure: EXPLORATORY LAPAROTOMY;  Surgeon: Emmalene Hare, MD;  Location: [facility];  Service: General;  Laterality: N/A;    Current Medications: Current Facility-Administered Medications  Medication Dose Route Frequency Provider Last Rate Last Admin   acetaminophen  (TYLENOL ) tablet 1,000 mg  1,000 mg Oral Q6H PRN Mills, Shnese E, NP   1,000 mg at 12/25/23 0813   albuterol  (VENTOLIN  HFA) 108 (90 Base) MCG/ACT inhaler 2 puff  2 puff Inhalation Q4H PRN Mills, Shnese E, NP       alum & mag hydroxide-simeth (MAALOX/MYLANTA) 200-200-20 MG/5ML suspension 30 mL  30 mL Oral Q4H PRN Mills, Shnese E, NP       ascorbic acid  (VITAMIN C ) tablet 500 mg  500 mg Oral Daily Mills, Shnese E, NP   500 mg at 12/25/23 0815   aspirin  EC tablet 81 mg  81 mg Oral Daily Mills, Shnese E, NP   81 mg at 12/25/23 1610   benztropine  (COGENTIN ) tablet 1 mg  1 mg Oral Daily Mills, Shnese E, NP   1 mg at 12/25/23 9604   busPIRone  (BUSPAR ) tablet 15 mg  15 mg Oral TID Mills, Shnese E, NP   15 mg at 12/25/23 0815   cholecalciferol  (VITAMIN D3) 25 MCG (1000 UNIT) tablet 1,000 Units  1,000 Units Oral Daily Mills, Shnese E, NP   1,000 Units at 12/25/23 0814   cyanocobalamin  (VITAMIN B12) tablet 1,000 mcg  1,000 mcg Oral Daily Mills, Shnese E, NP   1,000 mcg at 12/25/23 0814   feeding supplement (ENSURE PLUS HIGH PROTEIN) liquid 237 mL  237 mL Oral TID BM Jadapalle, Sree, MD   237 mL at 12/24/23 2113   fenofibrate  tablet 54 mg  54 mg Oral Daily Mills, Shnese E, NP   54 mg at 12/25/23 5409   FLUoxetine  (PROZAC ) capsule 20 mg  20 mg Oral Daily Mills,  Shnese E, NP   20 mg at 12/25/23 0815   folic acid  (FOLVITE ) tablet 1 mg  1 mg Oral Daily Mills, Shnese E, NP   1 mg at 12/25/23 0815   hydrOXYzine  (ATARAX ) tablet 10 mg  10 mg Oral Daily Mills, Shnese E, NP   10 mg at 12/25/23 8119   hydrOXYzine  (ATARAX ) tablet 25 mg  25 mg Oral TID PRN Mills, Shnese E, NP   25 mg at 12/23/23 1712   iron  polysaccharides (NIFEREX) capsule 150 mg  150 mg Oral Daily Mills, Shnese E, NP   150 mg at 12/25/23 0813   lamoTRIgine  (  LAMICTAL ) tablet 25 mg  25 mg Oral BID Angelia Barcelona, NP   25 mg at 12/25/23 0815   levothyroxine  (SYNTHROID ) tablet 88 mcg  88 mcg Oral Q0600 Mills, Shnese E, NP   88 mcg at 12/25/23 0611   linaclotide  (LINZESS ) capsule 290 mcg  290 mcg Oral Daily Mills, Shnese E, NP   290 mcg at 12/25/23 1610   magnesium  hydroxide (MILK OF MAGNESIA) suspension 30 mL  30 mL Oral Daily PRN Mills, Shnese E, NP       metoprolol  tartrate (LOPRESSOR ) tablet 25 mg  25 mg Oral BID Mills, Shnese E, NP   25 mg at 12/25/23 0815   multivitamin with minerals tablet 1 tablet  1 tablet Oral Daily Jadapalle, Sree, MD   1 tablet at 12/25/23 9604   OLANZapine  (ZYPREXA ) injection 10 mg  10 mg Intramuscular TID PRN Doneen Fuelling, NP       OLANZapine  (ZYPREXA ) injection 5 mg  5 mg Intramuscular TID PRN Mills, Shnese E, NP       OLANZapine  (ZYPREXA ) tablet 15 mg  15 mg Oral QHS Angelia Barcelona, NP   15 mg at 12/24/23 2047   OLANZapine  zydis (ZYPREXA ) disintegrating tablet 5 mg  5 mg Oral TID PRN Mills, Shnese E, NP       pantoprazole  (PROTONIX ) EC tablet 40 mg  40 mg Oral QAC breakfast Mills, Shnese E, NP   40 mg at 12/25/23 0814   polyethylene glycol (MIRALAX  / GLYCOLAX ) packet 17 g  17 g Oral Daily Mills, Shnese E, NP   17 g at 12/25/23 5409   potassium chloride  SA (KLOR-CON  M) CR tablet 20 mEq  20 mEq Oral Daily Mills, Shnese E, NP   20 mEq at 12/25/23 0815   rosuvastatin  (CRESTOR ) tablet 5 mg  5 mg Oral Daily Mills, Shnese E, NP   5 mg at 12/25/23 0813   senna  (SENOKOT) tablet 17.2 mg  2 tablet Oral QHS Mills, Shnese E, NP   17.2 mg at 12/24/23 2047    Lab Results:  No results found for this or any previous visit (from the past 48 hours).   Blood Alcohol  level:  Lab Results  Component Value Date   [facility] <15 12/17/2023   ETH <15 11/03/2023    Metabolic Disorder Labs: Lab Results  Component Value Date   HGBA1C 5.1 04/15/2023   MPG 99.67 04/15/2023   MPG 88 12/13/2022   No results found for: PROLACTIN Lab Results  Component Value Date   CHOL 117 12/22/2023   TRIG 45 12/22/2023   HDL 62 12/22/2023   CHOLHDL 1.9 12/22/2023   VLDL 9 12/22/2023   LDLCALC 46 12/22/2023   LDLCALC 43 03/25/2022     Psychiatric Specialty Exam:  Presentation  General Appearance:  Fairly Groomed  Eye Contact: Poor  Speech: Other (comment); Clear and Coherent (nonspontaneous)  Speech Volume: Other (comment) (low volume)    Mood and Affect  Mood:-improved today Dysphoric  Affect: Blunt   Thought Process  Thought Processes: Other (comment) (limited engagement today)  Descriptions of Associations:Intact  Orientation:Full (Time, Place and Person)  Thought Content:Logical  Hallucinations: denies  Ideas of Reference:denies Suicidal Thoughts:denies Homicidal Thoughts:denies  Sensorium  Memory: Recent Good  Judgment: Impaired  Insight: Fair   Chief Operating Officer Attention Span:fair Recall:fair Fund of Knowledge:fair Language:fair  Psychomotor Activity  Psychomotor Activity:NL Musculoskeletal: Strength & Muscle Tone: within normal limits Gait & Station: normal Assets  Assets:resilience   Physical Exam:  Physical Exam ROS Blood pressure 136/85, pulse 66, temperature (!) 97.4 F (36.3 C), resp. rate 16, height 6' 4 (1.93 m), weight 80.7 kg, SpO2 100%. Body mass index is 21.67 kg/m.  Diagnosis: Principal Problem:   Schizophrenia (HCC)   PLAN: Safety and Monitoring:  -- Voluntary  admission to inpatient psychiatric unit for safety, stabilization and treatment  -- Daily contact with patient to assess and evaluate symptoms and progress in treatment  -- Patient's case to be discussed in multi-disciplinary team meeting  -- Observation Level : q15 minute checks  -- Vital signs:  q12 hours  -- Precautions: suicide, elopement, and assault -- Encouraged patient to participate in unit milieu and in scheduled group therapies  2. Psychiatric Diagnoses and Treatment:  Zyprexa  15 mg at bedtime Prozac  20 mg daily Buspar  15 mg TID       3. Medical Issues Being Addressed: Nutrition consult initiated related to weight loss     4. Discharge Planning:   -- Social work and case management to assist with discharge planning and identification of hospital follow-up needs prior to discharge  -- Estimated LOS: 3-4 days  Angelia Barcelona, NP 12/25/2023, 8:47 AM

## 2023-12-25 NOTE — Group Note (Signed)
 Date:  12/25/2023 Time:  10:23 AM  Group Topic/Focus:  Goals Group:   The focus of this group is to help patients establish daily goals to achieve during treatment and discuss how the patient can incorporate goal setting into their daily lives to aide in recovery.    Participation Level:  Active  Participation Quality:  Appropriate  Affect:  Appropriate  Cognitive:  Appropriate  Insight: Appropriate  Engagement in Group:  Engaged  Modes of Intervention:  Activity  Additional Comments:    [name] Cassidee Deats 12/25/2023, 10:23 AM

## 2023-12-25 NOTE — Group Note (Signed)
 Date:  12/25/2023 Time:  4:37 AM  Group Topic/Focus:  Emotional Education:   The focus of this group is to discuss what feelings/emotions are, and how they are experienced.    Participation Level:  Minimal  Participation Quality:  Resistant  Affect:  Anxious, Depressed, and Resistant  Cognitive:  Alert  Insight: None  Engagement in Group:  None  Modes of Intervention:  Discussion, Education, and Support  Additional Comments:  n/a  Tyara Dassow L 12/25/2023, 4:37 AM

## 2023-12-25 NOTE — Group Note (Signed)
 Date:  12/25/2023 Time:  8:31 PM  Group Topic/Focus:  Wrap-Up Group:   The focus of this group is to help patients review their daily goal of treatment and discuss progress on daily workbooks.    Participation Level:  Minimal  Participation Quality:  Appropriate and Attentive  Affect:  Appropriate  Cognitive:  Alert and Appropriate  Insight: Appropriate  Engagement in Group:  None  Modes of Intervention:  Discussion  Additional Comments:     Maglione,Ginelle Bays E 12/25/2023, 8:31 PM

## 2023-12-25 NOTE — Group Note (Signed)
 Date:  12/25/2023 Time:  4:50 PM  Group Topic/Focus:  Activity Group: The focus of the group is to promote activity for the patients and to encourage them to go outside to the courtyard and get some fresh air and exercise.    Participation Level:  Active  Participation Quality:  Appropriate  Affect:  Appropriate  Cognitive:  Appropriate  Insight: Appropriate  Engagement in Group:  Engaged  Modes of Intervention:  Activity  Additional Comments:    [name] [name] 12/25/2023, 4:50 PM

## 2023-12-25 NOTE — Progress Notes (Signed)
   12/24/23 2200  Psych Admission Type (Psych Patients Only)  Admission Status Voluntary  Psychosocial Assessment  Patient Complaints Anxiety;Depression  Eye Contact Fair  Facial Expression Anxious  Affect Depressed  Speech Logical/coherent  Interaction Assertive  Motor Activity Slow  Appearance/Hygiene In scrubs  Behavior Characteristics Cooperative;Appropriate to situation  Mood Anxious  Thought Process  Coherency WDL  Content WDL  Delusions None reported or observed  Perception WDL  Hallucination None reported or observed  Judgment Poor  Confusion None  Danger to Self  Current suicidal ideation? Denies  Self-Injurious Behavior No self-injurious ideation or behavior indicators observed or expressed   Agreement Not to Harm Self Yes  Description of Agreement verbal  Danger to Others  Danger to Others None reported or observed

## 2023-12-26 ENCOUNTER — Inpatient Hospital Stay

## 2023-12-26 DIAGNOSIS — F200 Paranoid schizophrenia: Secondary | ICD-10-CM | POA: Diagnosis not present

## 2023-12-26 NOTE — Progress Notes (Addendum)
 Pt continues to have bizarre behaivors, he paces the hallways, stops and stares and at times seems to be stance and seems to be responding to internal stimuli but denies AVH. Pt also received an abrasion to his left arm while playing basketball, area cleaned ans bandaged applied and no further complaints. Pt also, had his left hand Xray today due to punching the door last evening., results pending.      12/26/23 1400  Psych Admission Type (Psych Patients Only)  Admission Status Voluntary  Psychosocial Assessment  Patient Complaints Anxiety  Eye Contact Staring  Facial Expression Anxious;Flat  Affect Labile  Speech Slow  Interaction Minimal  Motor Activity Slow  Appearance/Hygiene Poor hygiene  Behavior Characteristics Restless  Mood Depressed;Anxious  Thought Process  Coherency WDL  Content Delusions  Delusions Persecutory  Perception WDL  Hallucination Auditory  Judgment Poor  Confusion Mild  Danger to Self  Current suicidal ideation? Passive  Self-Injurious Behavior No self-injurious ideation or behavior indicators observed or expressed   Agreement Not to Harm Self Yes  Description of Agreement  (verbal)  Danger to Others  Danger to Others None reported or observed

## 2023-12-26 NOTE — Group Note (Signed)
 Recreation Therapy Group Note   Group Topic:General Recreation  Group Date: 12/26/2023 Start Time: 1500 End Time: 1600 Facilitators: Deatrice Factor, LRT, CTRS Location: Courtyard  Group Description: [employer]. LRT and patients played games of basketball, drew with chalk, and played corn hole while outside in the courtyard while getting fresh air and sunlight. Music was being played in the background. LRT and peers conversed about different games they have played before, what they do in their free time and anything else that is on their minds. LRT encouraged pts to drink water after being outside, sweating and getting their heart rate up.  Goal Area(s) Addressed: Patient will build on frustration tolerance skills. Patients will partake in a competitive play game with peers. Patients will gain knowledge of new leisure interest/hobby.    Affect/Mood: Flat   Participation Level: Non-verbal    Clinical Observations/Individualized Feedback: Rashed was in and out of group multiple times.   Plan: Continue to engage patient in RT group sessions 2-3x/week.   Deatrice Factor, LRT, CTRS 12/26/2023 5:17 PM
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
## 2023-12-26 NOTE — Progress Notes (Addendum)
 The patient attended the wrap-up group, consumed all prescribed HS snacks, and was compliant with evening medications. Patient denied pain, SI/HI/AVH, and stated his mood as I don't know. Later at bedtime, the patient appeared anxious, irritable, and agitated, and was observed in a catatonic stupor. He refused to respond to all assessment questions. Subsequently, the patient punched the exit door, resulting in a small laceration on the wrist with visible bleeding. The wound was cleaned, bandaged, and the patient was escorted to his room. He was administered PRN hydroxyzine  for anxiety and olanzapine  for agitation, both of which had a positive effect. The patient was later observed sleeping peacefully, with no further concerns or unsafe behaviors noted at that time.   12/26/23 0400  Psych Admission Type (Psych Patients Only)  Admission Status Voluntary  Psychosocial Assessment  Patient Complaints Agitation;Anxiety;Irritability  Eye Contact Fair  Facial Expression Anxious;Flat  Affect Depressed  Speech Slow;Pressured  Interaction Minimal  Motor Activity Slow  Appearance/Hygiene Poor hygiene;In scrubs  Behavior Characteristics Agitated;Anxious  Mood Anxious;Depressed  Thought Process  Coherency WDL  Content Delusions  Delusions Persecutory (Punching on the exit door)  Perception WDL  Hallucination None reported or observed  Judgment Poor  Confusion Mild  Danger to Self  Current suicidal ideation? Passive  Self-Injurious Behavior Actively performing acts reported or observed (Punching on the door)  Agreement Not to Harm Self Yes  Description of Agreement Verbal  Danger to Others  Danger to Others None reported or observed

## 2023-12-26 NOTE — Group Note (Signed)
 Recreation Therapy Group Note   Group Topic:Coping Skills  Group Date: 12/26/2023 Start Time: 1050 End Time: 1140 Facilitators: Deatrice Factor, LRT, CTRS Location: Craft Room  Group Description: Mind Map.  Patient was provided a blank template of a diagram with 32 blank boxes in a tiered system, branching from the center (similar to a bubble chart). LRT directed patients to label the middle of the diagram Coping Skills. LRT and patients then came up with 8 different coping skills as examples. Pt were directed to record their coping skills in the 2nd tier boxes closest to the center.  Patients would then share their coping skills with the group as LRT wrote them out. LRT gave a handout of 99 different coping skills at the end of group.   Goal Area(s) Addressed: Patients will be able to define "coping skills". Patient will identify new coping skills.  Patient will increase communication.   Affect/Mood: Flat   Participation Level: Non-verbal    Clinical Observations/Individualized Feedback: Derrick Atkins came late to group. Pt chose not to complete a worksheet. Pt did not interact with LRT or peers.   Plan: Continue to engage patient in RT group sessions 2-3x/week.   Deatrice Factor, LRT, CTRS 12/26/2023 1:16 PM

## 2023-12-26 NOTE — Group Note (Signed)
 Date:  12/26/2023 Time:  10:11 AM  Group Topic/Focus:  Coping With Mental Health Crisis:   The purpose of this group is to help patients identify strategies for coping with mental health crisis.  Group discusses possible causes of crisis and ways to manage them effectively. Conflict Resolution:   The focus of this group is to discuss the conflict resolution process and how it may be used upon discharge. Goals Group:   The focus of this group is to help patients establish daily goals to achieve during treatment and discuss how the patient can incorporate goal setting into their daily lives to aide in recovery.    Participation Level:  Active  Participation Quality:  Appropriate  Affect:  Appropriate  Cognitive:  Appropriate  Insight: Appropriate  Engagement in Group:  Engaged  Modes of Intervention:  Discussion  Additional Comments:    Shirl Weir L Vivian Okelley 12/26/2023, 10:11 AM

## 2023-12-26 NOTE — Plan of Care (Signed)
 The patient attended the wrap-up group, consumed all prescribed HS snacks, and was compliant with evening medications. Later at bedtime, the patient appeared anxious, irritable, and agitated, and was observed in a catatonic stupor. He refused to respond to all assessment questions. Subsequently, the patient punched the exit door, resulting in a small laceration on the wrist with visible bleeding. The wound was cleaned, bandaged, and the patient was escorted to his room. He was administered PRN hydroxyzine  for anxiety and olanzapine  for agitation, both of which had a positive effect. The patient was later observed sleeping peacefully, with no further concerns or unsafe behaviors noted at that time.  Problem: Education: Goal: Knowledge of [facility] information/materials will improve Outcome: Progressing Goal: Emotional status will improve Outcome: Progressing Goal: Mental status will improve Outcome: Progressing Goal: Verbalization of understanding the information provided will improve Outcome: Progressing   Problem: Activity: Goal: Interest or engagement in activities will improve Outcome: Progressing Goal: Sleeping patterns will improve Outcome: Progressing   Problem: Coping: Goal: Ability to verbalize frustrations and anger appropriately will improve Outcome: Progressing Goal: Ability to demonstrate self-control will improve Outcome: Progressing   Problem: Health Behavior/Discharge Planning: Goal: Identification of resources available to assist in meeting health care needs will improve Outcome: Progressing Goal: Compliance with treatment plan for underlying cause of condition will improve Outcome: Progressing   Problem: Physical Regulation: Goal: Ability to maintain clinical measurements within normal limits will improve Outcome: Progressing   Problem: Safety: Goal: Periods of time without injury will increase Outcome: Progressing   Problem:  Education: Goal: Utilization of techniques to improve thought processes will improve Outcome: Progressing Goal: Knowledge of the prescribed therapeutic regimen will improve Outcome: Progressing   Problem: Activity: Goal: Interest or engagement in leisure activities will improve Outcome: Progressing Goal: Imbalance in normal sleep/wake cycle will improve Outcome: Progressing   Problem: Coping: Goal: Coping ability will improve Outcome: Progressing Goal: Will verbalize feelings Outcome: Progressing   Problem: Health Behavior/Discharge Planning: Goal: Ability to make decisions will improve Outcome: Progressing Goal: Compliance with therapeutic regimen will improve Outcome: Progressing   Problem: Role Relationship: Goal: Will demonstrate positive changes in social behaviors and relationships Outcome: Progressing   Problem: Safety: Goal: Ability to disclose and discuss suicidal ideas will improve Outcome: Progressing Goal: Ability to identify and utilize support systems that promote safety will improve Outcome: Progressing   Problem: Self-Concept: Goal: Will verbalize positive feelings about self Outcome: Progressing Goal: Level of anxiety will decrease Outcome: Progressing   Problem: Education: Goal: Ability to make informed decisions regarding treatment will improve Outcome: Progressing   Problem: Coping: Goal: Coping ability will improve Outcome: Progressing   Problem: Health Behavior/Discharge Planning: Goal: Identification of resources available to assist in meeting health care needs will improve Outcome: Progressing   Problem: Medication: Goal: Compliance with prescribed medication regimen will improve Outcome: Progressing   Problem: Self-Concept: Goal: Ability to disclose and discuss suicidal ideas will improve Outcome: Progressing Goal: Will verbalize positive feelings about self Outcome: Progressing

## 2023-12-26 NOTE — Progress Notes (Signed)
 [facility] MD Progress Note  12/26/2023 4:36 PM Derrick Atkins  MRN:  [id number]   Derrick Atkins is a 53yo male with a past psychiatric history of schizophrenia, major depressive disorder, adjustment disorder who presented to the emergency department via law enforcement voluntarily with concerns of worsening depression, hallucinations and thoughts to self-harm.   Per chart review patient voluntarily reported to the hospital with report of chronic pain and difficulty making friends at [facility] as well as experiencing auditory hallucinations that put him down.  He reported hearing these voices daily and has been ongoing for the last 2 years since his mother passed away he also endorsed visions and inability to read people's minds  Subjective:  Chart reviewed, case discussed in multidisciplinary meeting, patient seen during rounds.  Patient engaged for rounds.   6/16: On exam patient makes minimal eye contact and appears disengaged.  He did appears internally preoccupied he continues to shake his head no 1 question about thoughts of harming himself or others.  Though he denies hallucinations he appears internally preoccupied he has been observed to be in the group room.  Staff note some concern for attention seeking behavior.  Will order x-ray for hand.  Mood was dysphoric with blunted affect.  Will continue current meds consider adding daytime dose of Zyprexa .  He notes no adverse effects of medication.  Does not observe to be aggressive or agitated.  He did require as needed Zyprexa  overnight.  6/15 He has noted to not make eye contact with this provider which is not his normal.  In assessing symptoms he declines to elaborate checking his head.  In assessing for physical symptoms patient shakes his head no, he denies any thoughts to harm himself or anyone else, he denies hallucinations paranoia or delusions.  He is present for breakfast.  This provider asked related to his ankle which he states it is  getting better.  Reviewed information patient injured ankle several months ago and injury has been x-rayed several times by foot specialist, he denies any new injury.  Mood is dysphoric today with blunted affect however patient is unable to verbalize why we will continue to monitor for symptoms.  He is not appearing psychotic.  There are no self-harm or aggressive behaviors noted. Review medication changes and he denies any further AH.  Patient is doing well with Zyprexa  15mg  at bedtime. This provider discontinued Haldol  5mg  BID scheduled on admission in favor of monotherapy. He denies suicidal ideation/homicidal ideation.  He is aware unable to return to [facility] and will be awaiting placement options.    Sleep: Good  Appetite:  Fair  Past Psychiatric History: see h&P Family History:  Family History  Problem Relation Age of Onset   Heart disease Father    Heart attack Father    Social History:  Social History   Substance and Sexual Activity  Alcohol  Use No     Social History   Substance and Sexual Activity  Drug Use No    Social History   Socioeconomic History   Marital status: Single    Spouse name: Not on file   Number of children: Not on file   Years of education: Not on file   Highest education level: Not on file  Occupational History   Not on file  Tobacco Use   Smoking status: Never    Passive exposure: Never   Smokeless tobacco: Never  Vaping Use   Vaping status: Never Used  Substance and Sexual  Activity   Alcohol  use: No   Drug use: No   Sexual activity: Not Currently  Other Topics Concern   Not on file  Social History Narrative   Not on file   Social Drivers of Health   Financial Resource Strain: Not on file  Food Insecurity: No Food Insecurity (12/18/2023)   Hunger Vital Sign    Worried About Running Out of Food in the Last Year: Never true    Ran Out of Food in the Last Year: Never true  Transportation Needs: Unmet Transportation Needs  (12/18/2023)   PRAPARE - Administrator, Civil Service (Medical): Yes    Lack of Transportation (Non-Medical): Yes  Physical Activity: Not on file  Stress: Not on file  Social Connections: Not on file   Past Medical History:  Past Medical History:  Diagnosis Date   Cerebral palsy (HCC)    Depression    Diabetes mellitus without complication (HCC)    Hypertension    Kidney stones    Schizoaffective disorder (HCC)    Scoliosis     Past Surgical History:  Procedure Laterality Date   BOWEL RESECTION     CATARACT EXTRACTION W/PHACO Right 09/15/2022   Procedure: CATARACT EXTRACTION PHACO AND INTRAOCULAR LENS PLACEMENT (IOC) RIGHT DIABETIC VISION BLUE HEALON 5  15.79  01:31.9;  Surgeon: Annell Kidney, MD;  Location: [facility];  Service: Ophthalmology;  Laterality: Right;  Latex Diabetic   INCISION AND DRAINAGE ABSCESS Left 12/20/2022   Procedure: INCISION AND DRAINAGE ABSCESS;  Surgeon: Flynn Hylan, MD;  Location: [facility];  Service: General;  Laterality: Left;   LAPAROTOMY N/A 05/15/2020   Procedure: EXPLORATORY LAPAROTOMY;  Surgeon: Emmalene Hare, MD;  Location: [facility];  Service: General;  Laterality: N/A;    Current Medications: Current Facility-Administered Medications  Medication Dose Route Frequency Provider Last Rate Last Admin   acetaminophen  (TYLENOL ) tablet 1,000 mg  1,000 mg Oral Q6H PRN Mills, Shnese E, NP   1,000 mg at 12/26/23 0829   albuterol  (VENTOLIN  HFA) 108 (90 Base) MCG/ACT inhaler 2 puff  2 puff Inhalation Q4H PRN Mills, Shnese E, NP       alum & mag hydroxide-simeth (MAALOX/MYLANTA) 200-200-20 MG/5ML suspension 30 mL  30 mL Oral Q4H PRN Mills, Shnese E, NP       ascorbic acid  (VITAMIN C ) tablet 500 mg  500 mg Oral Daily Mills, Shnese E, NP   500 mg at 12/26/23 0819   aspirin  EC tablet 81 mg  81 mg Oral Daily Mills, Shnese E, NP   81 mg at 12/26/23 1610   benztropine  (COGENTIN ) tablet 1 mg  1 mg Oral Daily Mills, Shnese E, NP   1 mg at  12/26/23 9604   busPIRone  (BUSPAR ) tablet 15 mg  15 mg Oral TID Mills, Shnese E, NP   15 mg at 12/26/23 1451   cholecalciferol  (VITAMIN D3) 25 MCG (1000 UNIT) tablet 1,000 Units  1,000 Units Oral Daily Mills, Shnese E, NP   1,000 Units at 12/26/23 5409   cyanocobalamin  (VITAMIN B12) tablet 1,000 mcg  1,000 mcg Oral Daily Mills, Shnese E, NP   1,000 mcg at 12/26/23 0805   feeding supplement (ENSURE PLUS HIGH PROTEIN) liquid 237 mL  237 mL Oral TID BM Jadapalle, Sree, MD   237 mL at 12/26/23 1452   fenofibrate  tablet 54 mg  54 mg Oral Daily Mills, Shnese E, NP   54 mg at 12/26/23 0805   FLUoxetine  (PROZAC ) capsule 20 mg  20 mg Oral Daily Mills, Shnese E, NP   20 mg at 12/26/23 8119   folic acid  (FOLVITE ) tablet 1 mg  1 mg Oral Daily Mills, Shnese E, NP   1 mg at 12/26/23 1478   hydrOXYzine  (ATARAX ) tablet 10 mg  10 mg Oral Daily Mills, Shnese E, NP   10 mg at 12/26/23 0805   hydrOXYzine  (ATARAX ) tablet 25 mg  25 mg Oral TID PRN Mills, Shnese E, NP   25 mg at 12/25/23 2332   iron  polysaccharides (NIFEREX) capsule 150 mg  150 mg Oral Daily Mills, Shnese E, NP   150 mg at 12/26/23 2956   lamoTRIgine  (LAMICTAL ) tablet 25 mg  25 mg Oral BID Angelia Barcelona, NP   25 mg at 12/26/23 2130   levothyroxine  (SYNTHROID ) tablet 88 mcg  88 mcg Oral Q0600 Mills, Shnese E, NP   88 mcg at 12/26/23 8657   linaclotide  (LINZESS ) capsule 290 mcg  290 mcg Oral Daily Mills, Shnese E, NP   290 mcg at 12/26/23 0805   magnesium  hydroxide (MILK OF MAGNESIA) suspension 30 mL  30 mL Oral Daily PRN Mills, Shnese E, NP       metoprolol  tartrate (LOPRESSOR ) tablet 25 mg  25 mg Oral BID Mills, Shnese E, NP   25 mg at 12/26/23 0818   multivitamin with minerals tablet 1 tablet  1 tablet Oral Daily Jadapalle, Sree, MD   1 tablet at 12/26/23 0805   OLANZapine  (ZYPREXA ) injection 10 mg  10 mg Intramuscular TID PRN Mills, Shnese E, NP       OLANZapine  (ZYPREXA ) injection 5 mg  5 mg Intramuscular TID PRN Mills, Shnese E, NP        OLANZapine  (ZYPREXA ) tablet 15 mg  15 mg Oral QHS Angelia Barcelona, NP   15 mg at 12/25/23 2135   OLANZapine  zydis (ZYPREXA ) disintegrating tablet 5 mg  5 mg Oral TID PRN Mills, Shnese E, NP   5 mg at 12/25/23 2332   pantoprazole  (PROTONIX ) EC tablet 40 mg  40 mg Oral QAC breakfast Mills, Shnese E, NP   40 mg at 12/26/23 0806   polyethylene glycol (MIRALAX  / GLYCOLAX ) packet 17 g  17 g Oral Daily Mills, Shnese E, NP   17 g at 12/26/23 8469   potassium chloride  SA (KLOR-CON  M) CR tablet 20 mEq  20 mEq Oral Daily Mills, Shnese E, NP   20 mEq at 12/26/23 6295   rosuvastatin  (CRESTOR ) tablet 5 mg  5 mg Oral Daily Mills, Shnese E, NP   5 mg at 12/26/23 0805   senna (SENOKOT) tablet 17.2 mg  2 tablet Oral QHS Mills, Shnese E, NP   17.2 mg at 12/25/23 2136    Lab Results:  No results found for this or any previous visit (from the past 48 hours).   Blood Alcohol  level:  Lab Results  Component Value Date   [facility] <15 12/17/2023   ETH <15 11/03/2023    Metabolic Disorder Labs: Lab Results  Component Value Date   HGBA1C 5.1 04/15/2023   MPG 99.67 04/15/2023   MPG 88 12/13/2022   No results found for: PROLACTIN Lab Results  Component Value Date   CHOL 117 12/22/2023   TRIG 45 12/22/2023   HDL 62 12/22/2023   CHOLHDL 1.9 12/22/2023   VLDL 9 12/22/2023   LDLCALC 46 12/22/2023   LDLCALC 43 03/25/2022     Psychiatric Specialty Exam:  Presentation  General Appearance:  Fairly Groomed  Eye  Contact: Poor  Speech: Clear and Coherent  Speech Volume: Decreased    Mood and Affect  Mood:-improved today Dysphoric  Affect: Blunt   Thought Process  Thought Processes: Disorganized  Descriptions of Associations:Loose  Orientation:Full (Time, Place and Person)  Thought Content:Logical  Hallucinations: denies  Ideas of Reference:denies Suicidal Thoughts:denies Homicidal Thoughts:denies  Sensorium  Memory: Recent  Good  Judgment: Impaired  Insight: Lacking   Executive Functions  Concentration:fair Attention Span:fair Recall:fair Fund of Knowledge:fair Language:fair  Psychomotor Activity  Psychomotor Activity:NL Musculoskeletal: Strength & Muscle Tone: within normal limits Gait & Station: normal Assets  Assets:resilience   Physical Exam: Physical Exam Vitals and nursing note reviewed.  HENT:     Head: Atraumatic.   Eyes:     Extraocular Movements: Extraocular movements intact.   Pulmonary:     Effort: Pulmonary effort is normal.   Neurological:     Mental Status: He is alert and oriented to person, place, and time.    Review of Systems  Psychiatric/Behavioral:  Negative for suicidal ideas.    Blood pressure (!) 152/65, pulse 87, temperature 97.7 F (36.5 C), resp. rate 16, height 6' 4 (1.93 m), weight 80.7 kg, SpO2 100%. Body mass index is 21.67 kg/m.  Diagnosis: Principal Problem:   Schizophrenia (HCC)   PLAN: Safety and Monitoring:  -- Voluntary admission to inpatient psychiatric unit for safety, stabilization and treatment  -- Daily contact with patient to assess and evaluate symptoms and progress in treatment  -- Patient's case to be discussed in multi-disciplinary team meeting  -- Observation Level : q15 minute checks  -- Vital signs:  q12 hours  -- Precautions: suicide, elopement, and assault -- Encouraged patient to participate in unit milieu and in scheduled group therapies  2. Psychiatric Diagnoses and Treatment:  Appears more disorganized today will monitor and consider morning zyprexa  if symptoms persist.   Continue Zyprexa  15 mg at bedtime Continue Prozac  20 mg daily Continue Buspar  15 mg TID The risks/benefits/side-effects/alternatives to this medication were discussed in detail with the patient and time was given for questions. The patient consents to medication trial. -- Metabolic profile and EKG monitoring obtained while on an atypical  antipsychotic  -- Encouraged patient to participate in unit milieu and in scheduled group therapies         3. Medical Issues Being Addressed: Nutrition consult initiated related to weight loss     4. Discharge Planning:   -- Social work and case management to assist with discharge planning and identification of hospital follow-up needs prior to discharge  -- Estimated LOS: 3-4 days  Fay Hoop, PA-C 12/26/2023, 4:36 PM

## 2023-12-26 NOTE — Group Note (Signed)
 LCSW Group Therapy Note   Group Date: 12/26/2023 Start Time: 1300 End Time: 1400   Type of Therapy and Topic:  Group Therapy: Challenging Core Beliefs  Participation Level:  Did Not Attend  Description of Group:  Patients were educated about core beliefs and asked to identify one harmful core belief that they have. Patients were asked to explore from where those beliefs originate. Patients were asked to discuss how those beliefs make them feel and the resulting behaviors of those beliefs. They were then be asked if those beliefs are true and, if so, what evidence they have to support them. Lastly, group members were challenged to replace those negative core beliefs with helpful beliefs.   Therapeutic Goals:   1. Patient will identify harmful core beliefs and explore the origins of such beliefs. 2. Patient will identify feelings and behaviors that result from those core beliefs. 3. Patient will discuss whether such beliefs are true. 4.  Patient will replace harmful core beliefs with helpful ones.  Summary of Patient Progress:  Patient did not attend.   Therapeutic Modalities: Cognitive Behavioral Therapy; Solution-Focused Therapy   Rita Vialpando M Haislee Corso, LCSWA 12/26/2023  1:55 PM

## 2023-12-26 NOTE — Plan of Care (Signed)
  Problem: Education: Goal: Emotional status will improve Outcome: Not Met (add Reason) Goal: Mental status will improve Outcome: Not Met (add Reason)   Problem: Activity: Goal: Sleeping patterns will improve Outcome: Progressing   Problem: Coping: Goal: Ability to verbalize frustrations and anger appropriately will improve Outcome: Progressing

## 2023-12-27 DIAGNOSIS — F200 Paranoid schizophrenia: Secondary | ICD-10-CM | POA: Diagnosis not present

## 2023-12-27 NOTE — Progress Notes (Signed)
 Patient stated to this writer, during evening med pass that I had a much better day today.

## 2023-12-27 NOTE — Plan of Care (Signed)
   Problem: Education: Goal: Knowledge of [facility] Education information/materials will improve Outcome: Progressing Goal: Emotional status will improve Outcome: Progressing Goal: Mental status will improve Outcome: Progressing

## 2023-12-27 NOTE — Group Note (Signed)
 Date:  12/27/2023 Time:  9:00 PM  Group Topic/Focus:  Wrap-Up Group:   The focus of this group is to help patients review their daily goal of treatment and discuss progress on daily workbooks.    Participation Level:  Did Not Attend   Maglione,Blasa Raisch E 12/27/2023, 9:00 PM

## 2023-12-27 NOTE — Group Note (Signed)
 Recreation Therapy Group Note   Group Topic:General Recreation  Group Date: 12/27/2023 Start Time: 1000 End Time: 1100 Facilitators: Deatrice Factor, LRT, CTRS Location: Courtyard  Group Description: [employer]. LRT and patients played games of basketball, drew with chalk, and played corn hole while outside in the courtyard while getting fresh air and sunlight. Music was being played in the background. LRT and peers conversed about different games they have played before, what they do in their free time and anything else that is on their minds. LRT encouraged pts to drink water after being outside, sweating and getting their heart rate up.  Goal Area(s) Addressed: Patient will build on frustration tolerance skills. Patients will partake in a competitive play game with peers. Patients will gain knowledge of new leisure interest/hobby.    Affect/Mood: Appropriate   Participation Level: Active   Participation Quality: Independent   Behavior: Appropriate   Speech/Thought Process: Coherent   Insight: Fair   Judgement: Fair    Modes of Intervention: Activity   Patient Response to Interventions:  Receptive   Education Outcome:  Acknowledges education   Clinical Observations/Individualized Feedback: Derrick Atkins was active in their participation of session activities and group discussion. Pt interacted well with LRT and peers duration of session.    Plan: Continue to engage patient in RT group sessions 2-3x/week.   Deatrice Factor, LRT, CTRS 12/27/2023 11:41 AM

## 2023-12-27 NOTE — Group Note (Signed)
 [facility] LCSW Group Therapy Note   Group Date: 12/27/2023 Start Time: 1315 End Time: 1435  Type of Therapy/Topic:  Group Therapy:  Feelings about Diagnosis  Participation Level:  Active   Description of Group:    This group will allow patients to explore their thoughts and feelings about diagnoses they have received. Patients will be guided to explore their level of understanding and acceptance of these diagnoses. Facilitator will encourage patients to process their thoughts and feelings about the reactions of others to their diagnosis, and will guide patients in identifying ways to discuss their diagnosis with significant others in their lives. This group will be process-oriented, with patients participating in exploration of their own experiences as well as giving and receiving support and challenge from other group members.   Therapeutic Goals: 1. Patient will demonstrate understanding of diagnosis as evidence by identifying two or more symptoms of the disorder:  2. Patient will be able to express two feelings regarding the diagnosis 3. Patient will demonstrate ability to communicate their needs through discussion and/or role plays  Summary of Patient Progress: Patient was present in group.  Patient discussed how his mother was a supportive person for him.  He reports that there was a difference between mental health and physical diagnosis.  He discussed the stigma attached to mental illness.  Patient was appropriate in group and supportive of other group members.  Patient displayed fair insight.   Therapeutic Modalities:   Cognitive Behavioral Therapy Brief Therapy Feelings Identification    Larri Ply, LCSW

## 2023-12-27 NOTE — Group Note (Signed)
 Date:  12/27/2023 Time:  12:35 PM  Group Topic/Focus:  Goals Group:   The focus of this group is to help patients establish daily goals to achieve during treatment and discuss how the patient can incorporate goal setting into their daily lives to aide in recovery.    Participation Level:  Active  Participation Quality:  Appropriate  Affect:  Appropriate  Cognitive:  Appropriate  Insight: Appropriate  Engagement in Group:  Engaged  Modes of Intervention:  Discussion, Education, and Support  Additional Comments:    Laverne Potter 12/27/2023, 12:35 PM

## 2023-12-27 NOTE — Progress Notes (Signed)
 Patient requested a visit from a Chaplain.  This RN paged Chaplain and received a return call.  Order also entered for Chaplain consult.  When questioned, patient voiced feeling like the other patients were not engaging with him or befriending him  Patient also stated, I dont know if I'm autistic but I feel like I am.  Someone out in the courtyard stated that the older generation doesn't understand mental illness and I was offended.  RN redirected conversation with observation that Derrick Atkins/patient was a very kind person and how hard it must be to be feeling this way.  Patient was visibly stressed and feeling depressed because as he stated, its the same thing that happened at the last place and they're trying to send me back there  This RN encouraged patient to express his concerns when social work or provider discusses his placement after discharge.  This RN offered patient a copy of the New Testament Psalms.  Patient declined offer.

## 2023-12-27 NOTE — Progress Notes (Signed)
   12/27/23 0813  Psych Admission Type (Psych Patients Only)  Admission Status Voluntary  Psychosocial Assessment  Patient Complaints Anxiety  Eye Contact Fair  Facial Expression Flat  Affect Labile  Speech Soft  Interaction Minimal  Motor Activity Slow  Appearance/Hygiene Poor hygiene  Behavior Characteristics Cooperative;Restless  Mood Depressed;Anxious  Aggressive Behavior  Effect No apparent injury  Thought Process  Coherency WDL  Content Delusions  Delusions Persecutory  Perception WDL  Hallucination Auditory  Judgment Poor  Confusion Mild  Danger to Self  Current suicidal ideation? Denies  Self-Injurious Behavior No self-injurious ideation or behavior indicators observed or expressed   Agreement Not to Harm Self Yes  Description of Agreement verbal  Danger to Others  Danger to Others None reported or observed

## 2023-12-27 NOTE — Group Note (Signed)
 Recreation Therapy Group Note   Group Topic:Other  Group Date: 12/27/2023 Start Time: 1530 End Time: 1620 Facilitators: Yvonna Herder, CTRS Location: Craft Room  Activity Description/Intervention: Therapeutic Drumming. Patients with peers and staff were given the opportunity to engage in a leader facilitated [facility] Group Empowerment Drumming Circle with staff from the [employer], in partnership with [employer]. Teaching laboratory technician and trained [employer], Kathlyne Parchment leading with LRT observing and documenting intervention and pt response. This evidenced-based practice targets 7 areas of health and wellbeing in the human experience including: stress-reduction, exercise, self-expression, camaraderie/support, nurturing, spirituality, and music-making (leisure).    Goal Area(s) Addresses:  Patient will engage in pro-social way in music group.  Patient will follow directions of drum leader on the first prompt. Patient will demonstrate no behavioral issues during group.  Patient will identify if a reduction in stress level occurs as a result of participation in therapeutic drum circle.     Affect/Mood: Flat   Participation Level: Non-verbal    Clinical Observations/Individualized Feedback: Kendrell was in and out of group multiple times. Pt did not interact with LRT or peers.   Plan: Continue to engage patient in RT group sessions 2-3x/week.   Deatrice Factor, LRT, CTRS 12/27/2023 5:26 PM

## 2023-12-27 NOTE — Progress Notes (Signed)
 [facility] MD Progress Note  12/27/2023 4:36 PM LINDA BIEHN  MRN:  [id number]   Derrick Atkins is a 53yo male with a past psychiatric history of schizophrenia, major depressive disorder, adjustment disorder who presented to the emergency department via law enforcement voluntarily with concerns of worsening depression, hallucinations and thoughts to self-harm.   Per chart review patient voluntarily reported to the hospital with report of chronic pain and difficulty making friends at [facility] as well as experiencing auditory hallucinations that put him down.  He reported hearing these voices daily and has been ongoing for the last 2 years since his mother passed away he also endorsed visions and inability to read people's minds  Subjective:  Chart reviewed, case discussed in multidisciplinary meeting, patient seen during rounds.  Patient engaged for rounds.   6/17: Patient is seen for rounds today.  They are alert and oriented to self and situation.  They are pleasant and cooperative.  They report they had a difficult day yesterday but are feeling better today.  They deny SI, HI, and AVH.  They do not appear internally preoccupied today they are observed interacting with staff and other patients.  They observed outside playing basketball.  Discussed x-ray results were unremarkable.  Denies adverse effects of medication.  Discussed that DSS will be visiting him to help with placement on Thursday.  He is not observed to be agitated or aggressive.  He voices no concerns or complaints at this time.  Encourage patient to continue drinking Ensure.  6/16: On exam patient makes minimal eye contact and appears disengaged.  He did appears internally preoccupied he continues to shake his head no 1 question about thoughts of harming himself or others.  Though he denies hallucinations he appears internally preoccupied he has been observed to be in the group room.  Staff note some concern for attention seeking  behavior.  Will order x-ray for hand.  Mood was dysphoric with blunted affect.  Will continue current meds consider adding daytime dose of Zyprexa .  He notes no adverse effects of medication.  Does not observe to be aggressive or agitated.  He did require as needed Zyprexa  overnight.  6/15 He has noted to not make eye contact with this provider which is not his normal.  In assessing symptoms he declines to elaborate checking his head.  In assessing for physical symptoms patient shakes his head no, he denies any thoughts to harm himself or anyone else, he denies hallucinations paranoia or delusions.  He is present for breakfast.  This provider asked related to his ankle which he states it is getting better.  Reviewed information patient injured ankle several months ago and injury has been x-rayed several times by foot specialist, he denies any new injury.  Mood is dysphoric today with blunted affect however patient is unable to verbalize why we will continue to monitor for symptoms.  He is not appearing psychotic.  There are no self-harm or aggressive behaviors noted. Review medication changes and he denies any further AH.  Patient is doing well with Zyprexa  15mg  at bedtime. This provider discontinued Haldol  5mg  BID scheduled on admission in favor of monotherapy. He denies suicidal ideation/homicidal ideation.  He is aware unable to return to [facility] and will be awaiting placement options.    Sleep: Good  Appetite:  Fair  Past Psychiatric History: see h&P Family History:  Family History  Problem Relation Age of Onset   Heart disease Father    Heart attack  Father    Social History:  Social History   Substance and Sexual Activity  Alcohol  Use No     Social History   Substance and Sexual Activity  Drug Use No    Social History   Socioeconomic History   Marital status: Single    Spouse name: Not on file   Number of children: Not on file   Years of education: Not on file    Highest education level: Not on file  Occupational History   Not on file  Tobacco Use   Smoking status: Never    Passive exposure: Never   Smokeless tobacco: Never  Vaping Use   Vaping status: Never Used  Substance and Sexual Activity   Alcohol  use: No   Drug use: No   Sexual activity: Not Currently  Other Topics Concern   Not on file  Social History Narrative   Not on file   Social Drivers of Health   Financial Resource Strain: Not on file  Food Insecurity: No Food Insecurity (12/18/2023)   Hunger Vital Sign    Worried About Running Out of Food in the Last Year: Never true    Ran Out of Food in the Last Year: Never true  Transportation Needs: Unmet Transportation Needs (12/18/2023)   PRAPARE - Administrator, Civil Service (Medical): Yes    Lack of Transportation (Non-Medical): Yes  Physical Activity: Not on file  Stress: Not on file  Social Connections: Not on file   Past Medical History:  Past Medical History:  Diagnosis Date   Cerebral palsy (HCC)    Depression    Diabetes mellitus without complication (HCC)    Hypertension    Kidney stones    Schizoaffective disorder (HCC)    Scoliosis     Past Surgical History:  Procedure Laterality Date   BOWEL RESECTION     CATARACT EXTRACTION W/PHACO Right 09/15/2022   Procedure: CATARACT EXTRACTION PHACO AND INTRAOCULAR LENS PLACEMENT (IOC) RIGHT DIABETIC VISION BLUE HEALON 5  15.79  01:31.9;  Surgeon: Annell Kidney, MD;  Location: [facility];  Service: Ophthalmology;  Laterality: Right;  Latex Diabetic   INCISION AND DRAINAGE ABSCESS Left 12/20/2022   Procedure: INCISION AND DRAINAGE ABSCESS;  Surgeon: Flynn Hylan, MD;  Location: [facility];  Service: General;  Laterality: Left;   LAPAROTOMY N/A 05/15/2020   Procedure: EXPLORATORY LAPAROTOMY;  Surgeon: Emmalene Hare, MD;  Location: [facility];  Service: General;  Laterality: N/A;    Current Medications: Current Facility-Administered Medications   Medication Dose Route Frequency Provider Last Rate Last Admin   acetaminophen  (TYLENOL ) tablet 1,000 mg  1,000 mg Oral Q6H PRN Mills, Shnese E, NP   1,000 mg at 12/27/23 0600   albuterol  (VENTOLIN  HFA) 108 (90 Base) MCG/ACT inhaler 2 puff  2 puff Inhalation Q4H PRN Mills, Shnese E, NP       alum & mag hydroxide-simeth (MAALOX/MYLANTA) 200-200-20 MG/5ML suspension 30 mL  30 mL Oral Q4H PRN Mills, Shnese E, NP       ascorbic acid  (VITAMIN C ) tablet 500 mg  500 mg Oral Daily Mills, Shnese E, NP   500 mg at 12/27/23 0813   aspirin  EC tablet 81 mg  81 mg Oral Daily Mills, Shnese E, NP   81 mg at 12/27/23 1610   benztropine  (COGENTIN ) tablet 1 mg  1 mg Oral Daily Mills, Shnese E, NP   1 mg at 12/27/23 0814   busPIRone  (BUSPAR ) tablet 15 mg  15 mg Oral  TID Mills, Shnese E, NP   15 mg at 12/27/23 1213   cholecalciferol  (VITAMIN D3) 25 MCG (1000 UNIT) tablet 1,000 Units  1,000 Units Oral Daily Mills, Shnese E, NP   1,000 Units at 12/27/23 1610   cyanocobalamin  (VITAMIN B12) tablet 1,000 mcg  1,000 mcg Oral Daily Mills, Shnese E, NP   1,000 mcg at 12/27/23 0814   feeding supplement (ENSURE PLUS HIGH PROTEIN) liquid 237 mL  237 mL Oral TID BM Jadapalle, Sree, MD   237 mL at 12/27/23 1100   fenofibrate  tablet 54 mg  54 mg Oral Daily Mills, Shnese E, NP   54 mg at 12/27/23 0813   FLUoxetine  (PROZAC ) capsule 20 mg  20 mg Oral Daily Mills, Shnese E, NP   20 mg at 12/27/23 9604   folic acid  (FOLVITE ) tablet 1 mg  1 mg Oral Daily Mills, Shnese E, NP   1 mg at 12/27/23 0813   hydrOXYzine  (ATARAX ) tablet 10 mg  10 mg Oral Daily Mills, Shnese E, NP   10 mg at 12/27/23 0813   hydrOXYzine  (ATARAX ) tablet 25 mg  25 mg Oral TID PRN Mills, Shnese E, NP   25 mg at 12/25/23 2332   iron  polysaccharides (NIFEREX) capsule 150 mg  150 mg Oral Daily Mills, Shnese E, NP   150 mg at 12/27/23 0813   lamoTRIgine  (LAMICTAL ) tablet 25 mg  25 mg Oral BID Angelia Barcelona, NP   25 mg at 12/27/23 0813   levothyroxine  (SYNTHROID )  tablet 88 mcg  88 mcg Oral Q0600 Mills, Shnese E, NP   88 mcg at 12/27/23 0557   linaclotide  (LINZESS ) capsule 290 mcg  290 mcg Oral Daily Mills, Shnese E, NP   290 mcg at 12/27/23 5409   magnesium  hydroxide (MILK OF MAGNESIA) suspension 30 mL  30 mL Oral Daily PRN Mills, Shnese E, NP       metoprolol  tartrate (LOPRESSOR ) tablet 25 mg  25 mg Oral BID Mills, Shnese E, NP   25 mg at 12/27/23 8119   multivitamin with minerals tablet 1 tablet  1 tablet Oral Daily Jadapalle, Sree, MD   1 tablet at 12/27/23 0813   OLANZapine  (ZYPREXA ) injection 10 mg  10 mg Intramuscular TID PRN Mills, Shnese E, NP       OLANZapine  (ZYPREXA ) injection 5 mg  5 mg Intramuscular TID PRN Mills, Shnese E, NP       OLANZapine  (ZYPREXA ) tablet 15 mg  15 mg Oral QHS Angelia Barcelona, NP   15 mg at 12/26/23 2148   OLANZapine  zydis (ZYPREXA ) disintegrating tablet 5 mg  5 mg Oral TID PRN Mills, Shnese E, NP   5 mg at 12/25/23 2332   pantoprazole  (PROTONIX ) EC tablet 40 mg  40 mg Oral QAC breakfast Orvil Bland E, NP   40 mg at 12/27/23 0813   polyethylene glycol (MIRALAX  / GLYCOLAX ) packet 17 g  17 g Oral Daily Mills, Shnese E, NP   17 g at 12/27/23 1478   potassium chloride  SA (KLOR-CON  M) CR tablet 20 mEq  20 mEq Oral Daily Orvil Bland E, NP   20 mEq at 12/27/23 2956   rosuvastatin  (CRESTOR ) tablet 5 mg  5 mg Oral Daily Mills, Shnese E, NP   5 mg at 12/27/23 0813   senna (SENOKOT) tablet 17.2 mg  2 tablet Oral QHS Orvil Bland E, NP   17.2 mg at 12/26/23 2148    Lab Results:  No results found for this or any  previous visit (from the past 48 hours).   Blood Alcohol  level:  Lab Results  Component Value Date   [facility] <15 12/17/2023   ETH <15 11/03/2023    Metabolic Disorder Labs: Lab Results  Component Value Date   HGBA1C 5.1 04/15/2023   MPG 99.67 04/15/2023   MPG 88 12/13/2022   No results found for: PROLACTIN Lab Results  Component Value Date   CHOL 117 12/22/2023   TRIG 45 12/22/2023   HDL 62 12/22/2023    CHOLHDL 1.9 12/22/2023   VLDL 9 12/22/2023   LDLCALC 46 12/22/2023   LDLCALC 43 03/25/2022     Psychiatric Specialty Exam:  Presentation  General Appearance:  Fairly Groomed  Eye Contact: Good  Speech: Clear and Coherent; Normal Rate  Speech Volume: Normal    Mood and Affect  Mood:-improved today Euthymic  Affect: Congruent   Thought Process  Thought Processes: Linear  Descriptions of Associations:Intact  Orientation:Full (Time, Place and Person)  Thought Content:Logical  Hallucinations: denies  Ideas of Reference:denies Suicidal Thoughts:denies Homicidal Thoughts:denies  Sensorium  Memory: Recent Fair  Judgment: Poor  Insight: Shallow   Chief Operating Officer Attention Span:fair Recall:fair Fund of Knowledge:fair Language:fair  Psychomotor Activity  Psychomotor Activity:NL Musculoskeletal: Strength & Muscle Tone: within normal limits Gait & Station: normal Assets  Assets:resilience   Physical Exam: Physical Exam Vitals and nursing note reviewed.  HENT:     Head: Atraumatic.   Eyes:     Extraocular Movements: Extraocular movements intact.   Pulmonary:     Effort: Pulmonary effort is normal.   Neurological:     Mental Status: He is alert.    Review of Systems  Psychiatric/Behavioral:  Positive for hallucinations. Negative for depression, substance abuse and suicidal ideas. The patient is not nervous/anxious and does not have insomnia.    Blood pressure 134/60, pulse 80, temperature 97.7 F (36.5 C), resp. rate 17, height 6' 4 (1.93 m), weight 80.7 kg, SpO2 100%. Body mass index is 21.67 kg/m.  Diagnosis: Principal Problem:   Schizophrenia (HCC)   PLAN: Safety and Monitoring:  -- Voluntary admission to inpatient psychiatric unit for safety, stabilization and treatment  -- Daily contact with patient to assess and evaluate symptoms and progress in treatment  -- Patient's case to be discussed in  multi-disciplinary team meeting  -- Observation Level : q15 minute checks  -- Vital signs:  q12 hours  -- Precautions: suicide, elopement, and assault -- Encouraged patient to participate in unit milieu and in scheduled group therapies  2. Psychiatric Diagnoses and Treatment:  Appears more linear today will monitor and consider morning zyprexa  if symptoms persist, denies hallucinations, per nursing notes appeared internally preoccupied at times.   Continue Zyprexa  15 mg at bedtime Continue Prozac  20 mg daily Continue Buspar  15 mg TID The risks/benefits/side-effects/alternatives to this medication were discussed in detail with the patient and time was given for questions. The patient consents to medication trial. -- Metabolic profile and EKG monitoring obtained while on an atypical antipsychotic  -- Encouraged patient to participate in unit milieu and in scheduled group therapies         3. Medical Issues Being Addressed: Nutrition consult initiated related to weight loss     4. Discharge Planning:   -- Social work and case management to assist with discharge planning and identification of hospital follow-up needs prior to discharge  -- Estimated LOS: 3-4 days  Fay Hoop, PA-C 12/27/2023, 4:36 PM

## 2023-12-27 NOTE — Plan of Care (Signed)
  Problem: Education: Goal: Knowledge of [facility] Education information/materials will improve Outcome: Progressing Goal: Emotional status will improve Outcome: Progressing Goal: Mental status will improve Outcome: Progressing Goal: Verbalization of understanding the information provided will improve Outcome: Progressing   Problem: Activity: Goal: Interest or engagement in activities will improve Outcome: Progressing Goal: Sleeping patterns will improve Outcome: Progressing   Problem: Coping: Goal: Ability to verbalize frustrations and anger appropriately will improve Outcome: Progressing Goal: Ability to demonstrate self-control will improve Outcome: Progressing   Problem: Health Behavior/Discharge Planning: Goal: Identification of resources available to assist in meeting health care needs will improve Outcome: Progressing Goal: Compliance with treatment plan for underlying cause of condition will improve Outcome: Progressing   Problem: Physical Regulation: Goal: Ability to maintain clinical measurements within normal limits will improve Outcome: Progressing   Problem: Safety: Goal: Periods of time without injury will increase Outcome: Progressing   Problem: Education: Goal: Utilization of techniques to improve thought processes will improve Outcome: Progressing Goal: Knowledge of the prescribed therapeutic regimen will improve Outcome: Progressing   Problem: Activity: Goal: Interest or engagement in leisure activities will improve Outcome: Progressing Goal: Imbalance in normal sleep/wake cycle will improve Outcome: Progressing   Problem: Coping: Goal: Coping ability will improve Outcome: Progressing Goal: Will verbalize feelings Outcome: Progressing   Problem: Health Behavior/Discharge Planning: Goal: Ability to make decisions will improve Outcome: Progressing Goal: Compliance with therapeutic regimen will improve Outcome: Progressing    Problem: Role Relationship: Goal: Will demonstrate positive changes in social behaviors and relationships Outcome: Progressing   Problem: Safety: Goal: Ability to disclose and discuss suicidal ideas will improve Outcome: Progressing Goal: Ability to identify and utilize support systems that promote safety will improve Outcome: Progressing   Problem: Self-Concept: Goal: Will verbalize positive feelings about self Outcome: Progressing Goal: Level of anxiety will decrease Outcome: Progressing   Problem: Education: Goal: Ability to make informed decisions regarding treatment will improve Outcome: Progressing   Problem: Coping: Goal: Coping ability will improve Outcome: Progressing   Problem: Health Behavior/Discharge Planning: Goal: Identification of resources available to assist in meeting health care needs will improve Outcome: Progressing   Problem: Medication: Goal: Compliance with prescribed medication regimen will improve Outcome: Progressing   Problem: Self-Concept: Goal: Ability to disclose and discuss suicidal ideas will improve Outcome: Progressing Goal: Will verbalize positive feelings about self Outcome: Progressing

## 2023-12-28 DIAGNOSIS — F200 Paranoid schizophrenia: Secondary | ICD-10-CM | POA: Diagnosis not present

## 2023-12-28 MED ORDER — ACETAMINOPHEN 500 MG PO TABS
1000.0000 mg | ORAL_TABLET | Freq: Four times a day (QID) | ORAL | Status: DC | PRN
  Administered 2023-12-29 – 2023-12-31 (×3): 1000 mg via ORAL
  Filled 2023-12-28 (×4): qty 2

## 2023-12-28 MED ORDER — IBUPROFEN 200 MG PO TABS
400.0000 mg | ORAL_TABLET | Freq: Four times a day (QID) | ORAL | Status: DC | PRN
  Administered 2023-12-28 – 2024-01-05 (×7): 400 mg via ORAL
  Filled 2023-12-28 (×8): qty 2

## 2023-12-28 NOTE — Group Note (Signed)
 Date:  12/28/2023 Time:  8:57 PM  Group Topic/Focus:  Wellness Toolbox:   The focus of this group is to discuss various aspects of wellness, balancing those aspects and exploring ways to increase the ability to experience wellness.  Patients will create a wellness toolbox for use upon discharge.    Participation Level:  Minimal  Participation Quality:  Appropriate  Affect:  Flat  Cognitive:  Appropriate  Insight: Limited  Engagement in Group:  Limited  Modes of Intervention:  Discussion  Additional Comments:     Derrick Atkins 12/28/2023, 8:57 PM

## 2023-12-28 NOTE — Group Note (Signed)

## 2023-12-28 NOTE — Group Note (Signed)
 Date:  12/28/2023 Time:  2:34 PM  Group Topic/Focus:  Goals Group:   The focus of this group is to help patients establish daily goals to achieve during treatment and discuss how the patient can incorporate goal setting into their daily lives to aide in recovery.    Participation Level:  Active  Participation Quality:  Appropriate  Affect:  Appropriate  Cognitive:  Appropriate  Insight: Appropriate  Engagement in Group:  Engaged  Modes of Intervention:  Discussion, Education, and Socialization  Additional Comments:    Laverne Potter 12/28/2023, 2:34 PM

## 2023-12-28 NOTE — Progress Notes (Signed)
   12/28/23 1700  Psych Admission Type (Psych Patients Only)  Admission Status Voluntary  Psychosocial Assessment  Patient Complaints Anxiety;Depression (patient reports depression and anxiety is still there.)  Eye Contact Fair;Watchful  Facial Expression Flat  Affect Blunted (patient not as talkative today and giving short answers)  Speech Logical/coherent;Soft  Interaction Minimal;Childlike  Motor Activity Slow;Shuffling  Appearance/Hygiene In scrubs  Behavior Characteristics Cooperative;Appropriate to situation  Mood Pleasant  Aggressive Behavior  Effect No apparent injury  Thought Process  Coherency WDL  Content WDL  Delusions None reported or observed  Perception WDL  Hallucination None reported or observed  Judgment Limited  Confusion None  Danger to Self  Current suicidal ideation? Denies  Self-Injurious Behavior No self-injurious ideation or behavior indicators observed or expressed   Agreement Not to Harm Self Yes  Description of Agreement Verbal  Danger to Others  Danger to Others None reported or observed

## 2023-12-28 NOTE — Plan of Care (Signed)
  Problem: Education: Goal: Knowledge of [facility] Education information/materials will improve Outcome: Progressing Goal: Emotional status will improve Outcome: Progressing Goal: Mental status will improve Outcome: Progressing Goal: Verbalization of understanding the information provided will improve Outcome: Progressing   Problem: Activity: Goal: Interest or engagement in activities will improve Outcome: Progressing Goal: Sleeping patterns will improve Outcome: Progressing   Problem: Coping: Goal: Ability to verbalize frustrations and anger appropriately will improve Outcome: Progressing Goal: Ability to demonstrate self-control will improve Outcome: Progressing   Problem: Health Behavior/Discharge Planning: Goal: Identification of resources available to assist in meeting health care needs will improve Outcome: Progressing Goal: Compliance with treatment plan for underlying cause of condition will improve Outcome: Progressing   Problem: Physical Regulation: Goal: Ability to maintain clinical measurements within normal limits will improve Outcome: Progressing   Problem: Safety: Goal: Periods of time without injury will increase Outcome: Progressing   Problem: Education: Goal: Utilization of techniques to improve thought processes will improve Outcome: Progressing Goal: Knowledge of the prescribed therapeutic regimen will improve Outcome: Progressing   Problem: Activity: Goal: Interest or engagement in leisure activities will improve Outcome: Progressing Goal: Imbalance in normal sleep/wake cycle will improve Outcome: Progressing   Problem: Coping: Goal: Coping ability will improve Outcome: Progressing Goal: Will verbalize feelings Outcome: Progressing   Problem: Health Behavior/Discharge Planning: Goal: Ability to make decisions will improve Outcome: Progressing Goal: Compliance with therapeutic regimen will improve Outcome: Progressing    Problem: Role Relationship: Goal: Will demonstrate positive changes in social behaviors and relationships Outcome: Progressing   Problem: Safety: Goal: Ability to disclose and discuss suicidal ideas will improve Outcome: Progressing Goal: Ability to identify and utilize support systems that promote safety will improve Outcome: Progressing   Problem: Self-Concept: Goal: Will verbalize positive feelings about self Outcome: Progressing Goal: Level of anxiety will decrease Outcome: Progressing   Problem: Education: Goal: Ability to make informed decisions regarding treatment will improve Outcome: Progressing   Problem: Coping: Goal: Coping ability will improve Outcome: Progressing   Problem: Health Behavior/Discharge Planning: Goal: Identification of resources available to assist in meeting health care needs will improve Outcome: Progressing   Problem: Medication: Goal: Compliance with prescribed medication regimen will improve Outcome: Progressing   Problem: Self-Concept: Goal: Ability to disclose and discuss suicidal ideas will improve Outcome: Progressing Goal: Will verbalize positive feelings about self Outcome: Progressing

## 2023-12-28 NOTE — Plan of Care (Signed)
  Problem: Activity: Goal: Sleeping patterns will improve Outcome: Progressing   Problem: Education: Goal: Knowledge of [facility] Education information/materials will improve Outcome: Progressing Goal: Emotional status will improve Outcome: Progressing Goal: Mental status will improve Outcome: Progressing

## 2023-12-28 NOTE — Progress Notes (Signed)
   12/28/23 0945  Spiritual Encounters  Type of Visit Initial  Care provided to: Patient  Conversation partners present during encounter Nurse  Referral source Nurse (RN/NT/LPN)  Reason for visit Routine spiritual support  OnCall Visit No   Chaplain visited patient in response to a  in EPIC.  Patient shared feelings of loneliness, loss, disappointment and social anxiety.  Patient is concerned about being a burden to others and his placement since he doesn't feel he will be going back to the facility he came from.  Patient said SW is scheduled to come speak with him tomorrow about placement.  Patient has fears of the unknown and change.  Chaplain celebrated the connections patient has made on the Unit.  Chaplain assessed patient's source of spiritual strength is his [employer] faith and he finds great meaning in music.  Chaplain played a song for patient that patient shared he likes and Chaplain and patient discussed the lyrics of the Christian Metal song and what they mean to him.    Rev. Rana M. Nolon Baxter, M.Div. Chaplain Resident [facility]

## 2023-12-28 NOTE — Group Note (Signed)
 Date:  12/28/2023 Time:  5:30 PM  Group Topic/Focus:  Wellness Toolbox:   The focus of this group is to discuss various aspects of wellness, balancing those aspects and exploring ways to increase the ability to experience wellness.  Patients will create a wellness toolbox for use upon discharge.    Participation Level:  Active  Participation Quality:  Appropriate  Affect:  Appropriate  Cognitive:  Appropriate  Insight: Appropriate  Engagement in Group:  Engaged  Modes of Intervention:  Activity and Socialization  Additional Comments:    Laverne Potter 12/28/2023, 5:30 PM

## 2023-12-28 NOTE — Progress Notes (Signed)
   12/27/23 2055  Psych Admission Type (Psych Patients Only)  Admission Status Voluntary  Psychosocial Assessment  Patient Complaints Anxiety  Eye Contact Staring  Facial Expression Anxious  Affect Labile  Speech Soft  Interaction Minimal  Motor Activity Slow  Appearance/Hygiene Poor hygiene  Behavior Characteristics Cooperative  Mood Depressed  Aggressive Behavior  Effect No apparent injury  Thought Process  Coherency WDL  Content Delusions  Delusions Persecutory  Perception WDL  Hallucination Auditory  Judgment Poor  Confusion Mild  Danger to Self  Current suicidal ideation? Passive  Agreement Not to Harm Self Yes  Danger to Others  Danger to Others None reported or observed

## 2023-12-28 NOTE — Progress Notes (Cosign Needed)
 [facility] MD Progress Note  12/28/2023 4:19 PM Derrick Atkins  MRN:  [id number]   Derrick Atkins is a 53yo male with a past psychiatric history of schizophrenia, major depressive disorder, adjustment disorder who presented to the emergency department via law enforcement voluntarily with concerns of worsening depression, hallucinations and thoughts to self-harm.   Per chart review patient voluntarily reported to the hospital with report of chronic pain and difficulty making friends at [facility] as well as experiencing auditory hallucinations that put him down.  He reported hearing these voices daily and has been ongoing for the last 2 years since his mother passed away he also endorsed visions and inability to read people's minds  Subjective:  Chart reviewed, case discussed in multidisciplinary meeting, patient seen during rounds.  Patient engaged for rounds.   6/18: Patient seen today for follow-up.  They are alert and oriented to self and situation.  They are pleasant and cooperative.  They request access to their tablet because it helps to manage their anxiety discussed that we cannot allow tablet use on the unit.  They have a meeting with DSS tomorrow they are somewhat upset that they have to find new housing but they are agreeable to participating in the meeting.  Continue to encourage to drink Ensure.  Denies SI, HI, and AVH.  Does not appear overtly manic or psychotic.  Voices no concerns or complaints at this time.  6/17: Patient is seen for rounds today.  They are alert and oriented to self and situation.  They are pleasant and cooperative.  They report they had a difficult day yesterday but are feeling better today.  They deny SI, HI, and AVH.  They do not appear internally preoccupied today they are observed interacting with staff and other patients.  They observed outside playing basketball.  Discussed x-ray results were unremarkable.  Denies adverse effects of medication.  Discussed that  DSS will be visiting him to help with placement on Thursday.  He is not observed to be agitated or aggressive.  He voices no concerns or complaints at this time.  Encourage patient to continue drinking Ensure.  6/16: On exam patient makes minimal eye contact and appears disengaged.  He did appears internally preoccupied he continues to shake his head no 1 question about thoughts of harming himself or others.  Though he denies hallucinations he appears internally preoccupied he has been observed to be in the group room.  Staff note some concern for attention seeking behavior.  Will order x-ray for hand.  Mood was dysphoric with blunted affect.  Will continue current meds consider adding daytime dose of Zyprexa .  He notes no adverse effects of medication.  Does not observe to be aggressive or agitated.  He did require as needed Zyprexa  overnight.  6/15 He has noted to not make eye contact with this provider which is not his normal.  In assessing symptoms he declines to elaborate checking his head.  In assessing for physical symptoms patient shakes his head no, he denies any thoughts to harm himself or anyone else, he denies hallucinations paranoia or delusions.  He is present for breakfast.  This provider asked related to his ankle which he states it is getting better.  Reviewed information patient injured ankle several months ago and injury has been x-rayed several times by foot specialist, he denies any new injury.  Mood is dysphoric today with blunted affect however patient is unable to verbalize why we will continue to monitor for  symptoms.  He is not appearing psychotic.  There are no self-harm or aggressive behaviors noted. Review medication changes and he denies any further AH.  Patient is doing well with Zyprexa  15mg  at bedtime. This provider discontinued Haldol  5mg  BID scheduled on admission in favor of monotherapy. He denies suicidal ideation/homicidal ideation.  He is aware unable to return to  [facility] and will be awaiting placement options.    Sleep: Good  Appetite:  Fair  Past Psychiatric History: see h&P Family History:  Family History  Problem Relation Age of Onset   Heart disease Father    Heart attack Father    Social History:  Social History   Substance and Sexual Activity  Alcohol  Use No     Social History   Substance and Sexual Activity  Drug Use No    Social History   Socioeconomic History   Marital status: Single    Spouse name: Not on file   Number of children: Not on file   Years of education: Not on file   Highest education level: Not on file  Occupational History   Not on file  Tobacco Use   Smoking status: Never    Passive exposure: Never   Smokeless tobacco: Never  Vaping Use   Vaping status: Never Used  Substance and Sexual Activity   Alcohol  use: No   Drug use: No   Sexual activity: Not Currently  Other Topics Concern   Not on file  Social History Narrative   Not on file   Social Drivers of Health   Financial Resource Strain: Not on file  Food Insecurity: No Food Insecurity (12/18/2023)   Hunger Vital Sign    Worried About Running Out of Food in the Last Year: Never true    Ran Out of Food in the Last Year: Never true  Transportation Needs: Unmet Transportation Needs (12/18/2023)   PRAPARE - Administrator, Civil Service (Medical): Yes    Lack of Transportation (Non-Medical): Yes  Physical Activity: Not on file  Stress: Not on file  Social Connections: Not on file   Past Medical History:  Past Medical History:  Diagnosis Date   Cerebral palsy (HCC)    Depression    Diabetes mellitus without complication (HCC)    Hypertension    Kidney stones    Schizoaffective disorder (HCC)    Scoliosis     Past Surgical History:  Procedure Laterality Date   BOWEL RESECTION     CATARACT EXTRACTION W/PHACO Right 09/15/2022   Procedure: CATARACT EXTRACTION PHACO AND INTRAOCULAR LENS PLACEMENT (IOC) RIGHT DIABETIC  VISION BLUE HEALON 5  15.79  01:31.9;  Surgeon: Annell Kidney, MD;  Location: [facility];  Service: Ophthalmology;  Laterality: Right;  Latex Diabetic   INCISION AND DRAINAGE ABSCESS Left 12/20/2022   Procedure: INCISION AND DRAINAGE ABSCESS;  Surgeon: Flynn Hylan, MD;  Location: [facility];  Service: General;  Laterality: Left;   LAPAROTOMY N/A 05/15/2020   Procedure: EXPLORATORY LAPAROTOMY;  Surgeon: Emmalene Hare, MD;  Location: [facility];  Service: General;  Laterality: N/A;    Current Medications: Current Facility-Administered Medications  Medication Dose Route Frequency Provider Last Rate Last Admin   acetaminophen  (TYLENOL ) tablet 1,000 mg  1,000 mg Oral Q6H PRN Leshea Jaggers E, PA-C       albuterol  (VENTOLIN  HFA) 108 (90 Base) MCG/ACT inhaler 2 puff  2 puff Inhalation Q4H PRN Mills, Shnese E, NP       alum & mag hydroxide-simeth (  MAALOX/MYLANTA) 200-200-20 MG/5ML suspension 30 mL  30 mL Oral Q4H PRN Mills, Shnese E, NP       ascorbic acid  (VITAMIN C ) tablet 500 mg  500 mg Oral Daily Mills, Shnese E, NP   500 mg at 12/28/23 0842   aspirin  EC tablet 81 mg  81 mg Oral Daily Mills, Shnese E, NP   81 mg at 12/28/23 2130   benztropine  (COGENTIN ) tablet 1 mg  1 mg Oral Daily Mills, Shnese E, NP   1 mg at 12/28/23 8657   busPIRone  (BUSPAR ) tablet 15 mg  15 mg Oral TID Mills, Shnese E, NP   15 mg at 12/28/23 1433   cholecalciferol  (VITAMIN D3) 25 MCG (1000 UNIT) tablet 1,000 Units  1,000 Units Oral Daily Mills, Shnese E, NP   1,000 Units at 12/28/23 8469   cyanocobalamin  (VITAMIN B12) tablet 1,000 mcg  1,000 mcg Oral Daily Mills, Shnese E, NP   1,000 mcg at 12/28/23 0842   feeding supplement (ENSURE PLUS HIGH PROTEIN) liquid 237 mL  237 mL Oral TID BM Jadapalle, Sree, MD   237 mL at 12/28/23 1434   fenofibrate  tablet 54 mg  54 mg Oral Daily Mills, Shnese E, NP   54 mg at 12/28/23 6295   FLUoxetine  (PROZAC ) capsule 20 mg  20 mg Oral Daily Mills, Shnese E, NP   20 mg at  12/28/23 2841   folic acid  (FOLVITE ) tablet 1 mg  1 mg Oral Daily Mills, Shnese E, NP   1 mg at 12/28/23 3244   hydrOXYzine  (ATARAX ) tablet 10 mg  10 mg Oral Daily Mills, Shnese E, NP   10 mg at 12/28/23 0102   hydrOXYzine  (ATARAX ) tablet 25 mg  25 mg Oral TID PRN Mills, Shnese E, NP   25 mg at 12/25/23 2332   ibuprofen  (ADVIL ) tablet 400 mg  400 mg Oral Q6H PRN Amir Glaus E, PA-C   400 mg at 12/28/23 1433   iron  polysaccharides (NIFEREX) capsule 150 mg  150 mg Oral Daily Mills, Shnese E, NP   150 mg at 12/28/23 7253   lamoTRIgine  (LAMICTAL ) tablet 25 mg  25 mg Oral BID Angelia Barcelona, NP   25 mg at 12/28/23 6644   levothyroxine  (SYNTHROID ) tablet 88 mcg  88 mcg Oral Q0600 Mills, Shnese E, NP   88 mcg at 12/27/23 0557   linaclotide  (LINZESS ) capsule 290 mcg  290 mcg Oral Daily Mills, Shnese E, NP   290 mcg at 12/28/23 0347   magnesium  hydroxide (MILK OF MAGNESIA) suspension 30 mL  30 mL Oral Daily PRN Mills, Shnese E, NP       metoprolol  tartrate (LOPRESSOR ) tablet 25 mg  25 mg Oral BID Mills, Shnese E, NP   25 mg at 12/28/23 4259   multivitamin with minerals tablet 1 tablet  1 tablet Oral Daily Jadapalle, Sree, MD   1 tablet at 12/28/23 5638   OLANZapine  (ZYPREXA ) injection 10 mg  10 mg Intramuscular TID PRN Doneen Fuelling, NP       OLANZapine  (ZYPREXA ) injection 5 mg  5 mg Intramuscular TID PRN Mills, Shnese E, NP       OLANZapine  (ZYPREXA ) tablet 15 mg  15 mg Oral QHS Angelia Barcelona, NP   15 mg at 12/27/23 2105   OLANZapine  zydis (ZYPREXA ) disintegrating tablet 5 mg  5 mg Oral TID PRN Mills, Shnese E, NP   5 mg at 12/25/23 2332   pantoprazole  (PROTONIX ) EC tablet 40 mg  40 mg  Oral QAC breakfast Orvil Bland E, NP   40 mg at 12/28/23 0843   polyethylene glycol (MIRALAX  / GLYCOLAX ) packet 17 g  17 g Oral Daily Mills, Shnese E, NP   17 g at 12/28/23 2130   potassium chloride  SA (KLOR-CON  M) CR tablet 20 mEq  20 mEq Oral Daily Orvil Bland E, NP   20 mEq at 12/28/23 8657    rosuvastatin  (CRESTOR ) tablet 5 mg  5 mg Oral Daily Mills, Shnese E, NP   5 mg at 12/28/23 8469   senna (SENOKOT) tablet 17.2 mg  2 tablet Oral QHS Mills, Shnese E, NP   17.2 mg at 12/27/23 2106    Lab Results:  No results found for this or any previous visit (from the past 48 hours).   Blood Alcohol  level:  Lab Results  Component Value Date   [facility] <15 12/17/2023   ETH <15 11/03/2023    Metabolic Disorder Labs: Lab Results  Component Value Date   HGBA1C 5.1 04/15/2023   MPG 99.67 04/15/2023   MPG 88 12/13/2022   No results found for: PROLACTIN Lab Results  Component Value Date   CHOL 117 12/22/2023   TRIG 45 12/22/2023   HDL 62 12/22/2023   CHOLHDL 1.9 12/22/2023   VLDL 9 12/22/2023   LDLCALC 46 12/22/2023   LDLCALC 43 03/25/2022     Psychiatric Specialty Exam:  Presentation  General Appearance:  Fairly Groomed  Eye Contact: Good  Speech: Clear and Coherent; Normal Rate  Speech Volume: Normal    Mood and Affect  Mood:-improved today Euthymic  Affect: Congruent   Thought Process  Thought Processes: Linear  Descriptions of Associations:Intact  Orientation:Full (Time, Place and Person)  Thought Content:Logical  Hallucinations: denies  Ideas of Reference:denies Suicidal Thoughts:denies Homicidal Thoughts:denies  Sensorium  Memory: Recent Fair  Judgment: Poor  Insight: Shallow   Chief Operating Officer Attention Span:fair Recall:fair Fund of Knowledge:fair Language:fair  Psychomotor Activity  Psychomotor Activity:NL Musculoskeletal: Strength & Muscle Tone: within normal limits Gait & Station: normal Assets  Assets:resilience   Physical Exam: Physical Exam Vitals and nursing note reviewed.  HENT:     Head: Atraumatic.   Eyes:     Extraocular Movements: Extraocular movements intact.   Pulmonary:     Effort: Pulmonary effort is normal.   Neurological:     Mental Status: He is alert.     Review of Systems  Psychiatric/Behavioral:  Positive for hallucinations. Negative for depression, substance abuse and suicidal ideas. The patient is not nervous/anxious and does not have insomnia.    Blood pressure 126/73, pulse 80, temperature (!) 97.3 F (36.3 C), resp. rate 16, height 6' 4 (1.93 m), weight 80.7 kg, SpO2 100%. Body mass index is 21.67 kg/m.  Diagnosis: Principal Problem:   Schizophrenia (HCC)   PLAN: Safety and Monitoring:  -- Voluntary admission to inpatient psychiatric unit for safety, stabilization and treatment  -- Daily contact with patient to assess and evaluate symptoms and progress in treatment  -- Patient's case to be discussed in multi-disciplinary team meeting  -- Observation Level : q15 minute checks  -- Vital signs:  q12 hours  -- Precautions: suicide, elopement, and assault -- Encouraged patient to participate in unit milieu and in scheduled group therapies  2. Psychiatric Diagnoses and Treatment:  Appears more linear today will monitor and consider morning zyprexa  if symptoms persist, denies hallucinations, per nursing notes appeared internally preoccupied at times.   Continue Zyprexa  15 mg at bedtime Continue Prozac  20  mg daily Continue Buspar  15 mg TID The risks/benefits/side-effects/alternatives to this medication were discussed in detail with the patient and time was given for questions. The patient consents to medication trial. -- Metabolic profile and EKG monitoring obtained while on an atypical antipsychotic  -- Encouraged patient to participate in unit milieu and in scheduled group therapies         3. Medical Issues Being Addressed: Nutrition consult initiated related to weight loss     4. Discharge Planning:   -- Social work and case management to assist with discharge planning and identification of hospital follow-up needs prior to discharge  -- Estimated LOS: 3-4 days  Fay Hoop, PA-C 12/28/2023, 4:19 PM

## 2023-12-28 NOTE — B[facility] Counselor (Addendum)
 CSW attempted to contact Amey Ka, [facility], [phone].  CSW left HIPAA compliant voicemail requesting a return phone call.   Shasta Deist, MSW, LCSW 12/28/2023 1:55 PM

## 2023-12-29 DIAGNOSIS — F200 Paranoid schizophrenia: Secondary | ICD-10-CM | POA: Diagnosis not present

## 2023-12-29 NOTE — Group Note (Signed)
 Date:  12/29/2023 Time:  6:04 PM  Group Topic/Focus:  Goals Group:   The focus of this group is to help patients establish daily goals to achieve during treatment and discuss how the patient can incorporate goal setting into their daily lives to aide in recovery.  Participation Level:  Active  Participation Quality:  Appropriate  Affect:  Appropriate  Cognitive:  Appropriate  Insight: Appropriate  Engagement in Group:  Engaged  Modes of Intervention:  Activity, Discussion, and Education  Additional Comments:    Shana Zavaleta A Taelyr Jantz 12/29/2023, 6:04 PM

## 2023-12-29 NOTE — Progress Notes (Signed)
   12/29/23 2000  Psych Admission Type (Psych Patients Only)  Admission Status Voluntary  Psychosocial Assessment  Patient Complaints Anxiety  Eye Contact Fair  Facial Expression Flat  Affect Blunted  Speech Logical/coherent  Interaction Minimal  Motor Activity Slow  Appearance/Hygiene In scrubs  Behavior Characteristics Appropriate to situation;Cooperative  Mood Preoccupied  Aggressive Behavior  Effect No apparent injury  Thought Process  Coherency WDL  Content WDL  Delusions None reported or observed  Perception WDL  Hallucination None reported or observed  Judgment Limited  Confusion Mild  Danger to Self  Current suicidal ideation? Denies  Agreement Not to Harm Self Yes  Description of Agreement verbal  Danger to Others  Danger to Others None reported or observed

## 2023-12-29 NOTE — Plan of Care (Signed)
  Problem: Education: Goal: Knowledge of [facility] Education information/materials will improve Outcome: Progressing Goal: Emotional status will improve Outcome: Progressing Goal: Mental status will improve Outcome: Progressing Goal: Verbalization of understanding the information provided will improve Outcome: Progressing   Problem: Activity: Goal: Interest or engagement in activities will improve Outcome: Progressing Goal: Sleeping patterns will improve Outcome: Progressing   Problem: Coping: Goal: Ability to verbalize frustrations and anger appropriately will improve Outcome: Progressing Goal: Ability to demonstrate self-control will improve Outcome: Progressing   Problem: Safety: Goal: Periods of time without injury will increase Outcome: Progressing

## 2023-12-29 NOTE — Plan of Care (Signed)
  Problem: Education: Goal: Emotional status will improve Outcome: Progressing Goal: Mental status will improve Outcome: Progressing   Problem: Coping: Goal: Ability to verbalize frustrations and anger appropriately will improve Outcome: Progressing   

## 2023-12-29 NOTE — Progress Notes (Signed)
 Pt requested chaplain  Order placed

## 2023-12-29 NOTE — Progress Notes (Signed)
 Pt visible in milieu with no peer interaction.  On engagement, Pt refused to process with staff and walked away.  Pt took HS medications. At about 2340 Pt OOB, c/o right foot pain 7/10; Ibuprofen  given.  During this time, Pt denied AVH, depression and SI/Hi; endorsed mild anxiety but was unable to identify trigger.    12/28/23 2200  Psych Admission Type (Psych Patients Only)  Admission Status Voluntary  Psychosocial Assessment  Patient Complaints Anxiety  Eye Contact Fair;Watchful  Facial Expression Flat  Affect Blunted  Speech Logical/coherent;Soft  Interaction Minimal;Childlike  Motor Activity Slow;Shuffling  Appearance/Hygiene In scrubs  Behavior Characteristics Cooperative  Mood Pleasant  Aggressive Behavior  Effect No apparent injury  Thought Process  Coherency WDL  Content WDL  Delusions None reported or observed  Perception WDL  Hallucination None reported or observed  Judgment Limited  Confusion None  Danger to Self  Current suicidal ideation? Denies  Agreement Not to Harm Self Yes  Description of Agreement Verbal  Danger to Others  Danger to Others None reported or observed    Problem: Education: Goal: Knowledge of [facility] Education information/materials will improve Outcome: Progressing Goal: Emotional status will improve Outcome: Progressing Goal: Mental status will improve Outcome: Progressing Goal: Verbalization of understanding the information provided will improve Outcome: Progressing   Problem: Activity: Goal: Interest or engagement in activities will improve Outcome: Progressing Goal: Sleeping patterns will improve Outcome: Progressing   Problem: Coping: Goal: Ability to verbalize frustrations and anger appropriately will improve Outcome: Progressing Goal: Ability to demonstrate self-control will improve Outcome: Progressing

## 2023-12-29 NOTE — Group Note (Signed)
 Recreation Therapy Group Note   Group Topic:Coping Skills  Group Date: 12/29/2023 Start Time: 1530 End Time: 1615 Facilitators: Yvonna Herder, CTRS Location: Craft Room  Group Description: Coping A-Z. LRT and patients engage in a guided discussion on what coping skills are and gave specific examples. LRT passed out a handout labeled Coping A-Z with blank spaces beside each letter. LRT prompted patients to come up with a coping skill for each of the letters. LRT and patients went over the handout and gave ideas for each letter if anyone had any blanks left on their paper. Patients kept this handout with them that listed 26 different coping skills.   Goal Area(s) Addressed: Patients will be able to define "coping skills". Patient will identify new coping skills.  Patient will increase communication.   Affect/Mood: N/A   Participation Level: Did not attend    Clinical Observations/Individualized Feedback: Patient did not attend group.   Plan: Continue to engage patient in RT group sessions 2-3x/week.   Deatrice Factor, LRT, CTRS 12/29/2023 5:26 PM

## 2023-12-29 NOTE — Progress Notes (Addendum)
 Pt condition remain unchanged. Pt pace the unit slowly, seems preoccupied and guarded. Pt is compliant with taking his medications, attends groups and other activities Pt is currently meeting with worker from DSS regarding placement Pt is been monitored on q 15 min rounds.Derrick Atkins       12/29/23 1400  Psych Admission Type (Psych Patients Only)  Admission Status Voluntary  Psychosocial Assessment  Patient Complaints Anxiety  Eye Contact Fair  Facial Expression Flat  Affect Blunted  Speech Logical/coherent  Interaction Minimal  Motor Activity Slow;Shuffling  Appearance/Hygiene In scrubs  Behavior Characteristics Cooperative  Mood Preoccupied  Thought Process  Coherency WDL  Content WDL  Delusions None reported or observed  Perception WDL  Hallucination None reported or observed  Judgment Limited  Confusion Mild  Danger to Self  Current suicidal ideation? Denies  Agreement Not to Harm Self Yes  Description of Agreement verbal  Danger to Others  Danger to Others None reported or observed

## 2023-12-29 NOTE — Progress Notes (Signed)
 [facility] MD Progress Note  12/29/2023 9:22 AM Derrick Atkins  MRN:  [id number]   Derrick Atkins is a 53yo male with a past psychiatric history of schizophrenia, major depressive disorder, adjustment disorder who presented to the emergency department via law enforcement voluntarily with concerns of worsening depression, hallucinations and thoughts to self-harm.   Per chart review patient voluntarily reported to the hospital with report of chronic pain and difficulty making friends at [facility] as well as experiencing auditory hallucinations that put him down.  He reported hearing these voices daily and has been ongoing for the last 2 years since his mother passed away he also endorsed visions and inability to read people's minds  Subjective:  Chart reviewed, case discussed in multidisciplinary meeting, patient seen during rounds.  Patient engaged for rounds.   6/19: Patient seen today for follow-up.  They are alert and oriented to self.  They are pleasant and cooperative.  They discussed their meeting this afternoon with DSS and are anxious about where they will be placed.  They are linear and logical on exam.  They deny SI, HI, and AVH.  They do not appear overtly manic or psychotic on exam.  They voiced no concerns or complaints at this time.  They are optimistic about the future.  Affect is bright.  They did not require psychiatric PRNs overnight.   6/18: Patient seen today for follow-up.  They are alert and oriented to self and situation.  They are pleasant and cooperative.  They request access to their tablet because it helps to manage their anxiety discussed that we cannot allow tablet use on the unit.  They have a meeting with DSS tomorrow they are somewhat upset that they have to find new housing but they are agreeable to participating in the meeting.  Continue to encourage to drink Ensure.  Denies SI, HI, and AVH.  Does not appear overtly manic or psychotic.  Voices no concerns or complaints  at this time.  6/17: Patient is seen for rounds today.  They are alert and oriented to self and situation.  They are pleasant and cooperative.  They report they had a difficult day yesterday but are feeling better today.  They deny SI, HI, and AVH.  They do not appear internally preoccupied today they are observed interacting with staff and other patients.  They observed outside playing basketball.  Discussed x-ray results were unremarkable.  Denies adverse effects of medication.  Discussed that DSS will be visiting him to help with placement on Thursday.  He is not observed to be agitated or aggressive.  He voices no concerns or complaints at this time.  Encourage patient to continue drinking Ensure.  6/16: On exam patient makes minimal eye contact and appears disengaged.  He did appears internally preoccupied he continues to shake his head no 1 question about thoughts of harming himself or others.  Though he denies hallucinations he appears internally preoccupied he has been observed to be in the group room.  Staff note some concern for attention seeking behavior.  Will order x-ray for hand.  Mood was dysphoric with blunted affect.  Will continue current meds consider adding daytime dose of Zyprexa .  He notes no adverse effects of medication.  Does not observe to be aggressive or agitated.  He did require as needed Zyprexa  overnight.  6/15 He has noted to not make eye contact with this provider which is not his normal.  In assessing symptoms he declines to elaborate checking  his head.  In assessing for physical symptoms patient shakes his head no, he denies any thoughts to harm himself or anyone else, he denies hallucinations paranoia or delusions.  He is present for breakfast.  This provider asked related to his ankle which he states it is getting better.  Reviewed information patient injured ankle several months ago and injury has been x-rayed several times by foot specialist, he denies any new injury.   Mood is dysphoric today with blunted affect however patient is unable to verbalize why we will continue to monitor for symptoms.  He is not appearing psychotic.  There are no self-harm or aggressive behaviors noted. Review medication changes and he denies any further AH.  Patient is doing well with Zyprexa  15mg  at bedtime. This provider discontinued Haldol  5mg  BID scheduled on admission in favor of monotherapy. He denies suicidal ideation/homicidal ideation.  He is aware unable to return to [facility] and will be awaiting placement options.    Sleep: Good  Appetite:  Fair  Past Psychiatric History: see h&P Family History:  Family History  Problem Relation Age of Onset   Heart disease Father    Heart attack Father    Social History:  Social History   Substance and Sexual Activity  Alcohol  Use No     Social History   Substance and Sexual Activity  Drug Use No    Social History   Socioeconomic History   Marital status: Single    Spouse name: Not on file   Number of children: Not on file   Years of education: Not on file   Highest education level: Not on file  Occupational History   Not on file  Tobacco Use   Smoking status: Never    Passive exposure: Never   Smokeless tobacco: Never  Vaping Use   Vaping status: Never Used  Substance and Sexual Activity   Alcohol  use: No   Drug use: No   Sexual activity: Not Currently  Other Topics Concern   Not on file  Social History Narrative   Not on file   Social Drivers of Health   Financial Resource Strain: Not on file  Food Insecurity: No Food Insecurity (12/18/2023)   Hunger Vital Sign    Worried About Running Out of Food in the Last Year: Never true    Ran Out of Food in the Last Year: Never true  Transportation Needs: Unmet Transportation Needs (12/18/2023)   PRAPARE - Administrator, Civil Service (Medical): Yes    Lack of Transportation (Non-Medical): Yes  Physical Activity: Not on file  Stress: Not  on file  Social Connections: Not on file   Past Medical History:  Past Medical History:  Diagnosis Date   Cerebral palsy (HCC)    Depression    Diabetes mellitus without complication (HCC)    Hypertension    Kidney stones    Schizoaffective disorder (HCC)    Scoliosis     Past Surgical History:  Procedure Laterality Date   BOWEL RESECTION     CATARACT EXTRACTION W/PHACO Right 09/15/2022   Procedure: CATARACT EXTRACTION PHACO AND INTRAOCULAR LENS PLACEMENT (IOC) RIGHT DIABETIC VISION BLUE HEALON 5  15.79  01:31.9;  Surgeon: Annell Kidney, MD;  Location: [facility];  Service: Ophthalmology;  Laterality: Right;  Latex Diabetic   INCISION AND DRAINAGE ABSCESS Left 12/20/2022   Procedure: INCISION AND DRAINAGE ABSCESS;  Surgeon: Flynn Hylan, MD;  Location: [facility];  Service: General;  Laterality: Left;  LAPAROTOMY N/A 05/15/2020   Procedure: EXPLORATORY LAPAROTOMY;  Surgeon: Emmalene Hare, MD;  Location: [facility];  Service: General;  Laterality: N/A;    Current Medications: Current Facility-Administered Medications  Medication Dose Route Frequency Provider Last Rate Last Admin   acetaminophen  (TYLENOL ) tablet 1,000 mg  1,000 mg Oral Q6H PRN Kenneisha Cochrane E, PA-C   1,000 mg at 12/29/23 0865   albuterol  (VENTOLIN  HFA) 108 (90 Base) MCG/ACT inhaler 2 puff  2 puff Inhalation Q4H PRN Mills, Shnese E, NP       alum & mag hydroxide-simeth (MAALOX/MYLANTA) 200-200-20 MG/5ML suspension 30 mL  30 mL Oral Q4H PRN Mills, Shnese E, NP       ascorbic acid  (VITAMIN C ) tablet 500 mg  500 mg Oral Daily Mills, Shnese E, NP   500 mg at 12/29/23 0814   aspirin  EC tablet 81 mg  81 mg Oral Daily Mills, Shnese E, NP   81 mg at 12/29/23 0813   benztropine  (COGENTIN ) tablet 1 mg  1 mg Oral Daily Mills, Shnese E, NP   1 mg at 12/29/23 7846   busPIRone  (BUSPAR ) tablet 15 mg  15 mg Oral TID Mills, Shnese E, NP   15 mg at 12/29/23 0814   cholecalciferol  (VITAMIN D3) 25 MCG (1000 UNIT)  tablet 1,000 Units  1,000 Units Oral Daily Mills, Shnese E, NP   1,000 Units at 12/29/23 9629   cyanocobalamin  (VITAMIN B12) tablet 1,000 mcg  1,000 mcg Oral Daily Mills, Shnese E, NP   1,000 mcg at 12/29/23 5284   feeding supplement (ENSURE PLUS HIGH PROTEIN) liquid 237 mL  237 mL Oral TID BM Jadapalle, Sree, MD   237 mL at 12/28/23 2107   fenofibrate  tablet 54 mg  54 mg Oral Daily Mills, Shnese E, NP   54 mg at 12/29/23 1324   FLUoxetine  (PROZAC ) capsule 20 mg  20 mg Oral Daily Mills, Shnese E, NP   20 mg at 12/29/23 0813   folic acid  (FOLVITE ) tablet 1 mg  1 mg Oral Daily Mills, Shnese E, NP   1 mg at 12/29/23 4010   hydrOXYzine  (ATARAX ) tablet 10 mg  10 mg Oral Daily Mills, Shnese E, NP   10 mg at 12/29/23 0813   hydrOXYzine  (ATARAX ) tablet 25 mg  25 mg Oral TID PRN Mills, Shnese E, NP   25 mg at 12/25/23 2332   ibuprofen  (ADVIL ) tablet 400 mg  400 mg Oral Q6H PRN Ardenia Stiner E, PA-C   400 mg at 12/28/23 2340   iron  polysaccharides (NIFEREX) capsule 150 mg  150 mg Oral Daily Mills, Shnese E, NP   150 mg at 12/29/23 0813   lamoTRIgine  (LAMICTAL ) tablet 25 mg  25 mg Oral BID Angelia Barcelona, NP   25 mg at 12/29/23 2725   levothyroxine  (SYNTHROID ) tablet 88 mcg  88 mcg Oral Q0600 Doneen Fuelling, NP   88 mcg at 12/29/23 3664   linaclotide  (LINZESS ) capsule 290 mcg  290 mcg Oral Daily Mills, Shnese E, NP   290 mcg at 12/29/23 4034   magnesium  hydroxide (MILK OF MAGNESIA) suspension 30 mL  30 mL Oral Daily PRN Mills, Shnese E, NP       metoprolol  tartrate (LOPRESSOR ) tablet 25 mg  25 mg Oral BID Mills, Shnese E, NP   25 mg at 12/29/23 7425   multivitamin with minerals tablet 1 tablet  1 tablet Oral Daily Jadapalle, Sree, MD   1 tablet at 12/29/23 0814   OLANZapine  (ZYPREXA ) injection  10 mg  10 mg Intramuscular TID PRN Mills, Shnese E, NP       OLANZapine  (ZYPREXA ) injection 5 mg  5 mg Intramuscular TID PRN Mills, Shnese E, NP       OLANZapine  (ZYPREXA ) tablet 15 mg  15 mg Oral QHS Angelia Barcelona, NP   15 mg at 12/28/23 2107   OLANZapine  zydis (ZYPREXA ) disintegrating tablet 5 mg  5 mg Oral TID PRN Mills, Shnese E, NP   5 mg at 12/25/23 2332   pantoprazole  (PROTONIX ) EC tablet 40 mg  40 mg Oral QAC breakfast Mills, Shnese E, NP   40 mg at 12/29/23 0814   polyethylene glycol (MIRALAX  / GLYCOLAX ) packet 17 g  17 g Oral Daily Mills, Shnese E, NP   17 g at 12/29/23 7829   potassium chloride  SA (KLOR-CON  M) CR tablet 20 mEq  20 mEq Oral Daily Orvil Bland E, NP   20 mEq at 12/29/23 5621   rosuvastatin  (CRESTOR ) tablet 5 mg  5 mg Oral Daily Mills, Shnese E, NP   5 mg at 12/29/23 0813   senna (SENOKOT) tablet 17.2 mg  2 tablet Oral QHS Mills, Shnese E, NP   17.2 mg at 12/28/23 2106    Lab Results:  No results found for this or any previous visit (from the past 48 hours).   Blood Alcohol  level:  Lab Results  Component Value Date   [facility] <15 12/17/2023   ETH <15 11/03/2023    Metabolic Disorder Labs: Lab Results  Component Value Date   HGBA1C 5.1 04/15/2023   MPG 99.67 04/15/2023   MPG 88 12/13/2022   No results found for: PROLACTIN Lab Results  Component Value Date   CHOL 117 12/22/2023   TRIG 45 12/22/2023   HDL 62 12/22/2023   CHOLHDL 1.9 12/22/2023   VLDL 9 12/22/2023   LDLCALC 46 12/22/2023   LDLCALC 43 03/25/2022     Psychiatric Specialty Exam:  Presentation  General Appearance:  Casual  Eye Contact: Fair  Speech: Clear and Coherent  Speech Volume: Normal    Mood and Affect  Mood:-improved today Euthymic  Affect: Congruent   Thought Process  Thought Processes: Coherent  Descriptions of Associations:Intact  Orientation:Full (Time, Place and Person)  Thought Content:Logical  Hallucinations: denies  Ideas of Reference:denies Suicidal Thoughts:denies Homicidal Thoughts:denies  Sensorium  Memory: Immediate Fair; Recent Fair  Judgment: -- (at baseline)  Insight: Shallow   Executive Functions   Concentration:fair Attention Span:fair Recall:fair Fund of Knowledge:fair Language:fair  Psychomotor Activity  Psychomotor Activity:NL Musculoskeletal: Strength & Muscle Tone: within normal limits Gait & Station: normal Assets  Assets:resilience   Physical Exam: Physical Exam Vitals and nursing note reviewed.  HENT:     Head: Atraumatic.   Eyes:     Extraocular Movements: Extraocular movements intact.   Pulmonary:     Effort: Pulmonary effort is normal.   Neurological:     Mental Status: He is alert and oriented to person, place, and time.    Review of Systems  Psychiatric/Behavioral:  Negative for depression, hallucinations, substance abuse and suicidal ideas. The patient is not nervous/anxious and does not have insomnia.    Blood pressure 139/82, pulse 80, temperature (!) 97.3 F (36.3 C), resp. rate 20, height 6' 4 (1.93 m), weight 80.7 kg, SpO2 100%. Body mass index is 21.67 kg/m.  Diagnosis: Principal Problem:   Schizophrenia (HCC)   PLAN: Safety and Monitoring:  -- Voluntary admission to inpatient psychiatric unit for safety, stabilization and  treatment  -- Daily contact with patient to assess and evaluate symptoms and progress in treatment  -- Patient's case to be discussed in multi-disciplinary team meeting  -- Observation Level : q15 minute checks  -- Vital signs:  q12 hours  -- Precautions: suicide, elopement, and assault -- Encouraged patient to participate in unit milieu and in scheduled group therapies  2. Psychiatric Diagnoses and Treatment:  Is doing well today. Meeting with DSS today concerning placement.   Continue Zyprexa  15 mg at bedtime Continue Prozac  20 mg daily Continue Buspar  15 mg TID The risks/benefits/side-effects/alternatives to this medication were discussed in detail with the patient and time was given for questions. The patient consents to medication trial. -- Metabolic profile and EKG monitoring obtained while on an atypical  antipsychotic  -- Encouraged patient to participate in unit milieu and in scheduled group therapies         3. Medical Issues Being Addressed: Nutrition consult initiated related to weight loss     4. Discharge Planning:   -- Social work and case management to assist with discharge planning and identification of hospital follow-up needs prior to discharge  -- Estimated LOS: 3-4 days  Fay Hoop, PA-C 12/29/2023, 9:22 AM

## 2023-12-29 NOTE — Group Note (Signed)
 [facility] LCSW Group Therapy Note   Group Date: 12/29/2023 Start Time: 1310 End Time: 1400   Type of Therapy/Topic:  Group Therapy:  Balance in Life  Participation Level:  Active   Description of Group:    This group will address the concept of balance and how it feels and looks when one is unbalanced. Patients will be encouraged to process areas in their lives that are out of balance, and identify reasons for remaining unbalanced. Facilitators will guide patients utilizing problem- solving interventions to address and correct the stressor making their life unbalanced. Understanding and applying boundaries will be explored and addressed for obtaining  and maintaining a balanced life. Patients will be encouraged to explore ways to assertively make their unbalanced needs known to significant others in their lives, using other group members and facilitator for support and feedback.  Therapeutic Goals: Patient will identify two or more emotions or situations they have that consume much of in their lives. Patient will identify signs/triggers that life has become out of balance:  Patient will identify two ways to set boundaries in order to achieve balance in their lives:  Patient will demonstrate ability to communicate their needs through discussion and/or role plays  Summary of Patient Progress: Patient was present in group.  Patient was active and supportive of other group members.  Patient shared how he has felt lonely most of my life.  He reports that he struggled with making peers.  He was receptive of feedback.  He did not seem to accept redirection.  He appeared to shutdown and did not participate in discussion further.   Therapeutic Modalities:   Cognitive Behavioral Therapy Solution-Focused Therapy Assertiveness Training   Larri Ply, LCSW

## 2023-12-29 NOTE — BHH Counselor (Signed)
 CSW spoke with Roberts, [phone], with [facility] and [facility].  She requests that H&P and FL2 be sent to [phone].  She reports that if accepted bed will not be available until after the 4th of July holiday.  CSW has sent fax and received confirmation that fax was successful.  CSW spoke with [facility] and was asked to send H&P and FL2 to [email or web address].  CSW has sent the requested information.  CSW spoke with DSS who report that they wish to visit the patient around 4PM.  CSW to assess if staff will be able to assist.   Shasta Deist, MSW, LCSW 12/29/2023 1:06 PM

## 2023-12-29 NOTE — BH IP Treatment Plan (Signed)
 Interdisciplinary Treatment and Diagnostic Plan Update  12/29/2023 Time of Session: 14:00 Derrick Atkins MRN: [id number]  Principal Diagnosis: Schizophrenia [facility])  Secondary Diagnoses: Principal Problem:   Schizophrenia (HCC)   Current Medications:  Current Facility-Administered Medications  Medication Dose Route Frequency Provider Last Rate Last Admin   acetaminophen  (TYLENOL ) tablet 1,000 mg  1,000 mg Oral Q6H PRN Millington, Matthew E, PA-C   1,000 mg at 12/29/23 4098   albuterol  (VENTOLIN  HFA) 108 (90 Base) MCG/ACT inhaler 2 puff  2 puff Inhalation Q4H PRN Mills, Shnese E, NP       alum & mag hydroxide-simeth (MAALOX/MYLANTA) 200-200-20 MG/5ML suspension 30 mL  30 mL Oral Q4H PRN Mills, Shnese E, NP       ascorbic acid  (VITAMIN C ) tablet 500 mg  500 mg Oral Daily Mills, Shnese E, NP   500 mg at 12/29/23 0814   aspirin  EC tablet 81 mg  81 mg Oral Daily Mills, Shnese E, NP   81 mg at 12/29/23 0813   benztropine  (COGENTIN ) tablet 1 mg  1 mg Oral Daily Mills, Shnese E, NP   1 mg at 12/29/23 1191   busPIRone  (BUSPAR ) tablet 15 mg  15 mg Oral TID Mills, Shnese E, NP   15 mg at 12/29/23 1239   cholecalciferol  (VITAMIN D3) 25 MCG (1000 UNIT) tablet 1,000 Units  1,000 Units Oral Daily Mills, Shnese E, NP   1,000 Units at 12/29/23 4782   cyanocobalamin  (VITAMIN B12) tablet 1,000 mcg  1,000 mcg Oral Daily Mills, Shnese E, NP   1,000 mcg at 12/29/23 9562   feeding supplement (ENSURE PLUS HIGH PROTEIN) liquid 237 mL  237 mL Oral TID BM Jadapalle, Sree, MD   237 mL at 12/29/23 1311   fenofibrate  tablet 54 mg  54 mg Oral Daily Mills, Shnese E, NP   54 mg at 12/29/23 1308   FLUoxetine  (PROZAC ) capsule 20 mg  20 mg Oral Daily Mills, Shnese E, NP   20 mg at 12/29/23 0813   folic acid  (FOLVITE ) tablet 1 mg  1 mg Oral Daily Mills, Shnese E, NP   1 mg at 12/29/23 6578   hydrOXYzine  (ATARAX ) tablet 10 mg  10 mg Oral Daily Mills, Shnese E, NP   10 mg at 12/29/23 0813   hydrOXYzine  (ATARAX ) tablet 25 mg   25 mg Oral TID PRN Mills, Shnese E, NP   25 mg at 12/25/23 2332   ibuprofen  (ADVIL ) tablet 400 mg  400 mg Oral Q6H PRN Millington, Matthew E, PA-C   400 mg at 12/28/23 2340   iron  polysaccharides (NIFEREX) capsule 150 mg  150 mg Oral Daily Mills, Shnese E, NP   150 mg at 12/29/23 0813   lamoTRIgine  (LAMICTAL ) tablet 25 mg  25 mg Oral BID Angelia Barcelona, NP   25 mg at 12/29/23 4696   levothyroxine  (SYNTHROID ) tablet 88 mcg  88 mcg Oral Q0600 Doneen Fuelling, NP   88 mcg at 12/29/23 2952   linaclotide  (LINZESS ) capsule 290 mcg  290 mcg Oral Daily Mills, Shnese E, NP   290 mcg at 12/29/23 8413   magnesium  hydroxide (MILK OF MAGNESIA) suspension 30 mL  30 mL Oral Daily PRN Mills, Shnese E, NP       metoprolol  tartrate (LOPRESSOR ) tablet 25 mg  25 mg Oral BID Mills, Shnese E, NP   25 mg at 12/29/23 2440   multivitamin with minerals tablet 1 tablet  1 tablet Oral Daily Jadapalle, Sree, MD   1 tablet  at 12/29/23 0814   OLANZapine  (ZYPREXA ) injection 10 mg  10 mg Intramuscular TID PRN Mills, Shnese E, NP       OLANZapine  (ZYPREXA ) injection 5 mg  5 mg Intramuscular TID PRN Mills, Shnese E, NP       OLANZapine  (ZYPREXA ) tablet 15 mg  15 mg Oral QHS Angelia Barcelona, NP   15 mg at 12/28/23 2107   OLANZapine  zydis (ZYPREXA ) disintegrating tablet 5 mg  5 mg Oral TID PRN Mills, Shnese E, NP   5 mg at 12/25/23 2332   pantoprazole  (PROTONIX ) EC tablet 40 mg  40 mg Oral QAC breakfast Mills, Shnese E, NP   40 mg at 12/29/23 0814   polyethylene glycol (MIRALAX  / GLYCOLAX ) packet 17 g  17 g Oral Daily Mills, Shnese E, NP   17 g at 12/29/23 2725   potassium chloride  SA (KLOR-CON  M) CR tablet 20 mEq  20 mEq Oral Daily Mills, Shnese E, NP   20 mEq at 12/29/23 3664   rosuvastatin  (CRESTOR ) tablet 5 mg  5 mg Oral Daily Mills, Shnese E, NP   5 mg at 12/29/23 0813   senna (SENOKOT) tablet 17.2 mg  2 tablet Oral QHS Mills, Shnese E, NP   17.2 mg at 12/28/23 2106   PTA Medications: Medications Prior to Admission   Medication Sig Dispense Refill Last Dose/Taking   acetaminophen  (TYLENOL ) 500 MG tablet Take 1,000 mg by mouth 3 (three) times daily as needed for mild pain or moderate pain.   Taking As Needed   ascorbic acid  (VITAMIN C ) 500 MG tablet Take 1 tablet (500 mg total) by mouth daily. 30 tablet 3 Past Week   aspirin  EC 81 MG tablet Take 1 tablet (81 mg total) by mouth daily. Swallow whole. 30 tablet 12 Past Week   benztropine  (COGENTIN ) 1 MG tablet Take 1 tablet (1 mg total) by mouth daily. 30 tablet 1 Past Week   busPIRone  (BUSPAR ) 15 MG tablet Take 15 mg by mouth 2 (two) times daily.   Past Week   cholecalciferol  (VITAMIN D3) 25 MCG (1000 UNIT) tablet Take 1,000 Units by mouth daily.   Past Week   Cyanocobalamin  (B-12) 1000 MCG CAPS Take 1 capsule by mouth daily.   Past Week   fenofibrate  54 MG tablet Take 54 mg by mouth daily.   Past Week   FLUoxetine  (PROZAC ) 20 MG capsule Take 20 mg by mouth daily.   Past Week   gemfibrozil  (LOPID ) 600 MG tablet Take 600 mg by mouth 2 (two) times daily before a meal.   Past Week   haloperidol  (HALDOL ) 5 MG tablet Take 5 mg by mouth 2 (two) times daily.   Past Week   hydrOXYzine  (ATARAX ) 10 MG tablet Take 10 mg by mouth daily. At noon   Past Week   iron  polysaccharides (NIFEREX) 150 MG capsule Take 1 capsule (150 mg total) by mouth daily. 30 capsule 3 Past Week   lamoTRIgine  (LAMICTAL ) 25 MG tablet Take 25 mg by mouth 2 (two) times daily.   Past Week   levothyroxine  (SYNTHROID ) 88 MCG tablet Take 1 tablet (88 mcg total) by mouth daily at 6 (six) AM. 30 tablet 1 Past Week   LINZESS  290 MCG CAPS capsule Take 290 mcg by mouth daily.   Past Week   loperamide  (IMODIUM ) 2 MG capsule Take 4 mg by mouth 2 (two) times daily.   Taking   OLANZapine  (ZYPREXA ) 7.5 MG tablet Take 3.75 mg by mouth at bedtime.  Past Week   pantoprazole  (PROTONIX ) 40 MG tablet Take 1 tablet (40 mg total) by mouth daily before breakfast. 30 tablet 0 Past Week   polyethylene glycol (MIRALAX  /  GLYCOLAX ) 17 g packet Take 17 g by mouth daily.   Past Week   potassium chloride  SA (KLOR-CON  M) 20 MEQ tablet Take 20 mEq by mouth daily.   Past Week   rosuvastatin  (CRESTOR ) 5 MG tablet Take 5 mg by mouth daily.   Past Week   senna (SENOKOT) 8.6 MG TABS tablet Take 2 tablets by mouth at bedtime.   Past Week   VENTOLIN  HFA 108 (90 Base) MCG/ACT inhaler Inhale into the lungs every 4 (four) hours as needed.   Taking As Needed   ABILIFY  MAINTENA 400 MG PRSY prefilled syringe Inject 400 mg into the muscle every 28 (twenty-eight) days.      folic acid  (FOLVITE ) 1 MG tablet Take 1 tablet (1 mg total) by mouth daily. (Patient not taking: Reported on 12/19/2023) 30 tablet 3 Not Taking   metoprolol  tartrate (LOPRESSOR ) 25 MG tablet Take 1 tablet (25 mg total) by mouth 2 (two) times daily. (Patient not taking: Reported on 12/19/2023) 60 tablet 2 Not Taking   OLANZapine  (ZYPREXA ) 10 MG tablet Take 10 mg by mouth at bedtime. (Take with 20mg  tablet to equal 30mg  total) (Patient not taking: Reported on 11/03/2023)      OLANZapine  (ZYPREXA ) 20 MG tablet Take 1 tablet (20 mg total) by mouth at bedtime. (Patient not taking: Reported on 12/18/2023) 30 tablet 1    OLANZapine  (ZYPREXA ) 5 MG tablet Add to your existing 20 mg dose of olanzapine  for nightly dose of 25 mg total (Patient not taking: Reported on 11/03/2023) 30 tablet 2     Patient Stressors:    Patient Strengths:    Treatment Modalities: Medication Management, Group therapy, Case management,  1 to 1 session with clinician, Psychoeducation, Recreational therapy.   Physician Treatment Plan for Primary Diagnosis: Schizophrenia (HCC) Long Term Goal(s): Improvement in symptoms so as ready for discharge   Short Term Goals: Ability to identify changes in lifestyle to reduce recurrence of condition will improve  Medication Management: Evaluate patient's response, side effects, and tolerance of medication regimen.  Therapeutic Interventions: 1 to 1 sessions, Unit  Group sessions and Medication administration.  Evaluation of Outcomes: Progressing  Physician Treatment Plan for Secondary Diagnosis: Principal Problem:   Schizophrenia (HCC)  Long Term Goal(s): Improvement in symptoms so as ready for discharge   Short Term Goals: Ability to identify changes in lifestyle to reduce recurrence of condition will improve     Medication Management: Evaluate patient's response, side effects, and tolerance of medication regimen.  Therapeutic Interventions: 1 to 1 sessions, Unit Group sessions and Medication administration.  Evaluation of Outcomes: Progressing   RN Treatment Plan for Primary Diagnosis: Schizophrenia (HCC) Long Term Goal(s): Knowledge of disease and therapeutic regimen to maintain health will improve  Short Term Goals: Ability to remain free from injury will improve, Ability to verbalize frustration and anger appropriately will improve, Ability to demonstrate self-control, Ability to participate in decision making will improve, Ability to verbalize feelings will improve, Ability to disclose and discuss suicidal ideas, Ability to identify and develop effective coping behaviors will improve, and Compliance with prescribed medications will improve  Medication Management: RN will administer medications as ordered by provider, will assess and evaluate patient's response and provide education to patient for prescribed medication. RN will report any adverse and/or side effects to prescribing provider.  Therapeutic Interventions: 1 on 1 counseling sessions, Psychoeducation, Medication administration, Evaluate responses to treatment, Monitor vital signs and CBGs as ordered, Perform/monitor CIWA, COWS, AIMS and Fall Risk screenings as ordered, Perform wound care treatments as ordered.  Evaluation of Outcomes: Progressing   LCSW Treatment Plan for Primary Diagnosis: Schizophrenia (HCC) Long Term Goal(s): Safe transition to appropriate next level of care  at discharge, Engage patient in therapeutic group addressing interpersonal concerns.  Short Term Goals: Engage patient in aftercare planning with referrals and resources, Increase social support, Increase ability to appropriately verbalize feelings, Increase emotional regulation, Facilitate acceptance of mental health diagnosis and concerns, and Increase skills for wellness and recovery  Therapeutic Interventions: Assess for all discharge needs, 1 to 1 time with Social worker, Explore available resources and support systems, Assess for adequacy in community support network, Educate family and significant other(s) on suicide prevention, Complete Psychosocial Assessment, Interpersonal group therapy.  Evaluation of Outcomes: Progressing   Progress in Treatment: Attending groups: Yes. and No. Participating in groups: No. Taking medication as prescribed: Yes. Toleration medication: Yes. Family/Significant other contact made: Yes, individual(s) contacted:  guardian, Coralee Derby. Patient understands diagnosis: Yes. Discussing patient identified problems/goals with staff: Yes. Medical problems stabilized or resolved: Yes. Denies suicidal/homicidal ideation: Yes. Issues/concerns per patient self-inventory: No. Other: none.   New problem(s) identified: No, Describe:  none  Update 12/24/2023: No changes at this time. Update 12/29/23: No changes at this time.    New Short Term/Long Term Goal(s): detox, elimination of symptoms of psychosis, medication management for mood stabilization; elimination of SI thoughts; development of comprehensive mental wellness/sobriety plan.  Update 12/24/2023: No changes at this time. Update 12/29/23: No changes at this time.   Patient Goals:  get a good nights rest  Update 12/24/2023: No changes at this time. Update 12/29/23: No changes at this time.   Discharge Plan or Barriers: CSW to assist in the development of appropriate discharge plans.  Patient reports a desire  to find a new placement.  CSW to reach out to the patient's guardian.    Update 12/24/2023: CSW and DSS are looking into placement and possible group homes for the Pt. Update 12/29/23: Pt has upcoming meeting with DSS this evening.    Reason for Continuation of Hospitalization: Anxiety Depression Medication stabilization Suicidal ideation   Estimated Length of Stay:  1-7 days Update 12/24/2023: TBD Update 12/29/23: No changes at this time.  Last 3 [place] Suicide Severity Risk Score: Flowsheet Row Admission (Current) from 12/18/2023 in [facility] ED from 12/17/2023 in [facility] Department at [facility] ED from 11/30/2023 in [facility] Emergency Department at [facility]  C-SSRS RISK CATEGORY Low Risk  [Simultaneous filing. User may not have seen previous data.] High Risk No Risk    Last PHQ 2/9 Scores:     No data to display          Scribe for Treatment Team: Randolm Butte, Buzz Cass 12/29/2023 3:27 PM

## 2023-12-29 NOTE — Group Note (Signed)
 Date:  12/29/2023 Time:  10:49 PM  Group Topic/Focus:  Personal Choices and Values:   The focus of this group is to help patients assess and explore the importance of values in their lives, how their values affect their decisions, how they express their values and what opposes their expression.    Participation Level:  Active  Participation Quality:  Appropriate, Attentive, Sharing, and Supportive  Affect:  Appropriate and Excited  Cognitive:  Alert and Appropriate  Insight: Appropriate, Good, and Improving  Engagement in Group:  Developing/Improving, Engaged, and Supportive  Modes of Intervention:  Activity, Discussion, Education, Exploration, Problem-solving, Rapport Building, Dance movement psychotherapist, Socialization, and Support  Additional Comments:     Derrick Atkins 12/29/2023, 10:49 PM

## 2023-12-29 NOTE — Group Note (Signed)
 Recreation Therapy Group Note   Group Topic:Health and Wellness  Group Date: 12/29/2023 Start Time: 1000 End Time: 1100 Facilitators: Deatrice Factor, LRT, CTRS Location: Courtyard  Group Description: [employer]. LRT and patients played games of basketball, drew with chalk, and played corn hole while outside in the courtyard while getting fresh air and sunlight. Music was being played in the background. LRT and peers conversed about different games they have played before, what they do in their free time and anything else that is on their minds. LRT encouraged pts to drink water after being outside, sweating and getting their heart rate up.  Goal Area(s) Addressed: Patient will build on frustration tolerance skills. Patients will partake in a competitive play game with peers. Patients will gain knowledge of new leisure interest/hobby.    Affect/Mood: Appropriate   Participation Level: Active   Participation Quality: Independent   Behavior: Appropriate   Speech/Thought Process: Coherent   Insight: Fair   Judgement: Fair    Modes of Intervention: Activity   Patient Response to Interventions:  Receptive   Education Outcome:  Acknowledges education   Clinical Observations/Individualized Feedback: Damain was active in their participation of session activities and group discussion. Pt interacted well with LRT and peers duration of session.    Plan: Continue to engage patient in RT group sessions 2-3x/week.   Deatrice Factor, LRT, CTRS 12/29/2023 11:32 AM

## 2023-12-29 NOTE — Plan of Care (Signed)
  Problem: Education: Goal: Knowledge of [facility] Education information/materials will improve Outcome: Not Progressing Goal: Emotional status will improve Outcome: Not Progressing Goal: Mental status will improve Outcome: Not Progressing Goal: Verbalization of understanding the information provided will improve Outcome: Not Progressing   Problem: Activity: Goal: Interest or engagement in activities will improve Outcome: Not Progressing Goal: Sleeping patterns will improve Outcome: Not Progressing   Problem: Coping: Goal: Ability to verbalize frustrations and anger appropriately will improve Outcome: Not Progressing Goal: Ability to demonstrate self-control will improve Outcome: Not Progressing   Problem: Health Behavior/Discharge Planning: Goal: Identification of resources available to assist in meeting health care needs will improve Outcome: Not Progressing Goal: Compliance with treatment plan for underlying cause of condition will improve Outcome: Not Progressing   Problem: Physical Regulation: Goal: Ability to maintain clinical measurements within normal limits will improve Outcome: Not Progressing   Problem: Safety: Goal: Periods of time without injury will increase Outcome: Not Progressing   Problem: Education: Goal: Utilization of techniques to improve thought processes will improve Outcome: Not Progressing Goal: Knowledge of the prescribed therapeutic regimen will improve Outcome: Not Progressing   Problem: Activity: Goal: Interest or engagement in leisure activities will improve Outcome: Not Progressing Goal: Imbalance in normal sleep/wake cycle will improve Outcome: Not Progressing   Problem: Coping: Goal: Coping ability will improve Outcome: Not Progressing Goal: Will verbalize feelings Outcome: Not Progressing   Problem: Health Behavior/Discharge Planning: Goal: Ability to make decisions will improve Outcome: Not Progressing Goal:  Compliance with therapeutic regimen will improve Outcome: Not Progressing   Problem: Role Relationship: Goal: Will demonstrate positive changes in social behaviors and relationships Outcome: Not Progressing   Problem: Safety: Goal: Ability to disclose and discuss suicidal ideas will improve Outcome: Not Progressing Goal: Ability to identify and utilize support systems that promote safety will improve Outcome: Not Progressing   Problem: Self-Concept: Goal: Will verbalize positive feelings about self Outcome: Not Progressing Goal: Level of anxiety will decrease Outcome: Not Progressing   Problem: Education: Goal: Ability to make informed decisions regarding treatment will improve Outcome: Not Progressing   Problem: Coping: Goal: Coping ability will improve Outcome: Not Progressing   Problem: Health Behavior/Discharge Planning: Goal: Identification of resources available to assist in meeting health care needs will improve Outcome: Not Progressing   Problem: Medication: Goal: Compliance with prescribed medication regimen will improve Outcome: Not Progressing   Problem: Self-Concept: Goal: Ability to disclose and discuss suicidal ideas will improve Outcome: Not Progressing Goal: Will verbalize positive feelings about self Outcome: Not Progressing   

## 2023-12-30 DIAGNOSIS — F200 Paranoid schizophrenia: Secondary | ICD-10-CM | POA: Diagnosis not present

## 2023-12-30 LAB — 25-HYDROXY VITAMIN D LCMS D2+D3
25-Hydroxy, Vitamin D-2: 1.2 ng/mL
25-Hydroxy, Vitamin D-3: 16 ng/mL
25-Hydroxy, Vitamin D: 17 ng/mL — ABNORMAL LOW

## 2023-12-30 NOTE — Plan of Care (Signed)
  Problem: Education: Goal: Knowledge of [facility] Education information/materials will improve Outcome: Not Progressing Goal: Emotional status will improve Outcome: Not Progressing Goal: Mental status will improve Outcome: Not Progressing Goal: Verbalization of understanding the information provided will improve Outcome: Not Progressing   Problem: Activity: Goal: Interest or engagement in activities will improve Outcome: Not Progressing Goal: Sleeping patterns will improve Outcome: Not Progressing   Problem: Coping: Goal: Ability to verbalize frustrations and anger appropriately will improve Outcome: Not Progressing Goal: Ability to demonstrate self-control will improve Outcome: Not Progressing   Problem: Health Behavior/Discharge Planning: Goal: Identification of resources available to assist in meeting health care needs will improve Outcome: Not Progressing Goal: Compliance with treatment plan for underlying cause of condition will improve Outcome: Not Progressing   Problem: Physical Regulation: Goal: Ability to maintain clinical measurements within normal limits will improve Outcome: Not Progressing   Problem: Safety: Goal: Periods of time without injury will increase Outcome: Not Progressing   Problem: Education: Goal: Utilization of techniques to improve thought processes will improve Outcome: Not Progressing Goal: Knowledge of the prescribed therapeutic regimen will improve Outcome: Not Progressing   Problem: Activity: Goal: Interest or engagement in leisure activities will improve Outcome: Not Progressing Goal: Imbalance in normal sleep/wake cycle will improve Outcome: Not Progressing   Problem: Coping: Goal: Coping ability will improve Outcome: Not Progressing Goal: Will verbalize feelings Outcome: Not Progressing   Problem: Health Behavior/Discharge Planning: Goal: Ability to make decisions will improve Outcome: Not Progressing Goal:  Compliance with therapeutic regimen will improve Outcome: Not Progressing   Problem: Role Relationship: Goal: Will demonstrate positive changes in social behaviors and relationships Outcome: Not Progressing   Problem: Safety: Goal: Ability to disclose and discuss suicidal ideas will improve Outcome: Not Progressing Goal: Ability to identify and utilize support systems that promote safety will improve Outcome: Not Progressing   Problem: Self-Concept: Goal: Will verbalize positive feelings about self Outcome: Not Progressing Goal: Level of anxiety will decrease Outcome: Not Progressing   Problem: Education: Goal: Ability to make informed decisions regarding treatment will improve Outcome: Not Progressing   Problem: Coping: Goal: Coping ability will improve Outcome: Not Progressing   Problem: Health Behavior/Discharge Planning: Goal: Identification of resources available to assist in meeting health care needs will improve Outcome: Not Progressing   Problem: Medication: Goal: Compliance with prescribed medication regimen will improve Outcome: Not Progressing   Problem: Self-Concept: Goal: Ability to disclose and discuss suicidal ideas will improve Outcome: Not Progressing Goal: Will verbalize positive feelings about self Outcome: Not Progressing   

## 2023-12-30 NOTE — Group Note (Signed)
 Date:  12/30/2023 Time:  3:04 PM  Group Topic/Focus:  Crisis Planning:   The purpose of this group is to help patients create a crisis plan for use upon discharge or in the future, as needed.    Participation Level:  Active  Participation Quality:  Appropriate  Affect:  Appropriate  Cognitive:  Appropriate  Insight: Appropriate  Engagement in Group:  Engaged  Modes of Intervention:  Activity  Additional Comments:    [name] Ahriyah Vannest 12/30/2023, 3:04 PM

## 2023-12-30 NOTE — Progress Notes (Signed)
 [facility] MD Progress Note  12/30/2023 7:31 PM Derrick Atkins  MRN:  [id number]   Derrick Atkins is a 53yo male with a past psychiatric history of schizophrenia, major depressive disorder, adjustment disorder who presented to the emergency department via law enforcement voluntarily with concerns of worsening depression, hallucinations and thoughts to self-harm.   Per chart review patient voluntarily reported to the hospital with report of chronic pain and difficulty making friends at [facility] as well as experiencing auditory hallucinations that put him down.  He reported hearing these voices daily and has been ongoing for the last 2 years since his mother passed away he also endorsed visions and inability to read people's minds  Subjective:  Chart reviewed, case discussed in multidisciplinary meeting, patient seen during rounds.  Patient engaged for rounds.   6/20: Patient seen for follow-up today.  They are alert and oriented to self and location.  They are pleasant and cooperative on exam.  They discussed that they met with DSS worker yesterday.  We discussed that DSS is working to help him find housing.  He feels that he is doing medically well.  Denies depression notes some anxiety pertaining to his unstable housing situation.  He denies SI, HI, and AVH.  They voiced no further concerns or complaints they were working on reading a book but had some difficulty and was trying to borrow reading glasses from another patient.  They note sleep and appetite are stable.  They do not require PRNs overnight.  6/19: Patient seen today for follow-up.  They are alert and oriented to self.  They are pleasant and cooperative.  They discussed their meeting this afternoon with DSS and are anxious about where they will be placed.  They are linear and logical on exam.  They deny SI, HI, and AVH.  They do not appear overtly manic or psychotic on exam.  They voiced no concerns or complaints at this time.  They are  optimistic about the future.  Affect is bright.  They did not require psychiatric PRNs overnight.   6/18: Patient seen today for follow-up.  They are alert and oriented to self and situation.  They are pleasant and cooperative.  They request access to their tablet because it helps to manage their anxiety discussed that we cannot allow tablet use on the unit.  They have a meeting with DSS tomorrow they are somewhat upset that they have to find new housing but they are agreeable to participating in the meeting.  Continue to encourage to drink Ensure.  Denies SI, HI, and AVH.  Does not appear overtly manic or psychotic.  Voices no concerns or complaints at this time.  6/17: Patient is seen for rounds today.  They are alert and oriented to self and situation.  They are pleasant and cooperative.  They report they had a difficult day yesterday but are feeling better today.  They deny SI, HI, and AVH.  They do not appear internally preoccupied today they are observed interacting with staff and other patients.  They observed outside playing basketball.  Discussed x-ray results were unremarkable.  Denies adverse effects of medication.  Discussed that DSS will be visiting him to help with placement on Thursday.  He is not observed to be agitated or aggressive.  He voices no concerns or complaints at this time.  Encourage patient to continue drinking Ensure.  6/16: On exam patient makes minimal eye contact and appears disengaged.  He did appears internally preoccupied he  continues to shake his head no 1 question about thoughts of harming himself or others.  Though he denies hallucinations he appears internally preoccupied he has been observed to be in the group room.  Staff note some concern for attention seeking behavior.  Will order x-ray for hand.  Mood was dysphoric with blunted affect.  Will continue current meds consider adding daytime dose of Zyprexa .  He notes no adverse effects of medication.  Does not observe  to be aggressive or agitated.  He did require as needed Zyprexa  overnight.  6/15 He has noted to not make eye contact with this provider which is not his normal.  In assessing symptoms he declines to elaborate checking his head.  In assessing for physical symptoms patient shakes his head no, he denies any thoughts to harm himself or anyone else, he denies hallucinations paranoia or delusions.  He is present for breakfast.  This provider asked related to his ankle which he states it is getting better.  Reviewed information patient injured ankle several months ago and injury has been x-rayed several times by foot specialist, he denies any new injury.  Mood is dysphoric today with blunted affect however patient is unable to verbalize why we will continue to monitor for symptoms.  He is not appearing psychotic.  There are no self-harm or aggressive behaviors noted. Review medication changes and he denies any further AH.  Patient is doing well with Zyprexa  15mg  at bedtime. This provider discontinued Haldol  5mg  BID scheduled on admission in favor of monotherapy. He denies suicidal ideation/homicidal ideation.  He is aware unable to return to [facility] and will be awaiting placement options.    Sleep: Good  Appetite:  Fair  Past Psychiatric History: see h&P Family History:  Family History  Problem Relation Age of Onset   Heart disease Father    Heart attack Father    Social History:  Social History   Substance and Sexual Activity  Alcohol  Use No     Social History   Substance and Sexual Activity  Drug Use No    Social History   Socioeconomic History   Marital status: Single    Spouse name: Not on file   Number of children: Not on file   Years of education: Not on file   Highest education level: Not on file  Occupational History   Not on file  Tobacco Use   Smoking status: Never    Passive exposure: Never   Smokeless tobacco: Never  Vaping Use   Vaping status: Never Used   Substance and Sexual Activity   Alcohol  use: No   Drug use: No   Sexual activity: Not Currently  Other Topics Concern   Not on file  Social History Narrative   Not on file   Social Drivers of Health   Financial Resource Strain: Not on file  Food Insecurity: No Food Insecurity (12/18/2023)   Hunger Vital Sign    Worried About Running Out of Food in the Last Year: Never true    Ran Out of Food in the Last Year: Never true  Transportation Needs: Unmet Transportation Needs (12/18/2023)   PRAPARE - Administrator, Civil Service (Medical): Yes    Lack of Transportation (Non-Medical): Yes  Physical Activity: Not on file  Stress: Not on file  Social Connections: Not on file   Past Medical History:  Past Medical History:  Diagnosis Date   Cerebral palsy (HCC)    Depression  Diabetes mellitus without complication (HCC)    Hypertension    Kidney stones    Schizoaffective disorder (HCC)    Scoliosis     Past Surgical History:  Procedure Laterality Date   BOWEL RESECTION     CATARACT EXTRACTION W/PHACO Right 09/15/2022   Procedure: CATARACT EXTRACTION PHACO AND INTRAOCULAR LENS PLACEMENT (IOC) RIGHT DIABETIC VISION BLUE HEALON 5  15.79  01:31.9;  Surgeon: Annell Kidney, MD;  Location: [facility];  Service: Ophthalmology;  Laterality: Right;  Latex Diabetic   INCISION AND DRAINAGE ABSCESS Left 12/20/2022   Procedure: INCISION AND DRAINAGE ABSCESS;  Surgeon: Flynn Hylan, MD;  Location: [facility];  Service: General;  Laterality: Left;   LAPAROTOMY N/A 05/15/2020   Procedure: EXPLORATORY LAPAROTOMY;  Surgeon: Emmalene Hare, MD;  Location: [facility];  Service: General;  Laterality: N/A;    Current Medications: Current Facility-Administered Medications  Medication Dose Route Frequency Provider Last Rate Last Admin   acetaminophen  (TYLENOL ) tablet 1,000 mg  1,000 mg Oral Q6H PRN Adrain Butrick E, PA-C   1,000 mg at 12/30/23 0618   albuterol  (VENTOLIN  HFA)  108 (90 Base) MCG/ACT inhaler 2 puff  2 puff Inhalation Q4H PRN Mills, Shnese E, NP       alum & mag hydroxide-simeth (MAALOX/MYLANTA) 200-200-20 MG/5ML suspension 30 mL  30 mL Oral Q4H PRN Mills, Shnese E, NP       ascorbic acid  (VITAMIN C ) tablet 500 mg  500 mg Oral Daily Mills, Shnese E, NP   500 mg at 12/30/23 0858   aspirin  EC tablet 81 mg  81 mg Oral Daily Mills, Shnese E, NP   81 mg at 12/30/23 4098   benztropine  (COGENTIN ) tablet 1 mg  1 mg Oral Daily Mills, Shnese E, NP   1 mg at 12/30/23 1191   busPIRone  (BUSPAR ) tablet 15 mg  15 mg Oral TID Mills, Shnese E, NP   15 mg at 12/30/23 1702   cholecalciferol  (VITAMIN D3) 25 MCG (1000 UNIT) tablet 1,000 Units  1,000 Units Oral Daily Mills, Shnese E, NP   1,000 Units at 12/30/23 4782   cyanocobalamin  (VITAMIN B12) tablet 1,000 mcg  1,000 mcg Oral Daily Mills, Shnese E, NP   1,000 mcg at 12/30/23 0841   feeding supplement (ENSURE PLUS HIGH PROTEIN) liquid 237 mL  237 mL Oral TID BM Jadapalle, Sree, MD   Given at 12/30/23 1703   fenofibrate  tablet 54 mg  54 mg Oral Daily Mills, Shnese E, NP   54 mg at 12/30/23 0841   FLUoxetine  (PROZAC ) capsule 20 mg  20 mg Oral Daily Mills, Shnese E, NP   20 mg at 12/30/23 0840   folic acid  (FOLVITE ) tablet 1 mg  1 mg Oral Daily Mills, Shnese E, NP   1 mg at 12/30/23 9562   hydrOXYzine  (ATARAX ) tablet 10 mg  10 mg Oral Daily Mills, Shnese E, NP   10 mg at 12/30/23 1308   hydrOXYzine  (ATARAX ) tablet 25 mg  25 mg Oral TID PRN Mills, Shnese E, NP   25 mg at 12/25/23 2332   ibuprofen  (ADVIL ) tablet 400 mg  400 mg Oral Q6H PRN Kymia Simi E, PA-C   400 mg at 12/28/23 2340   iron  polysaccharides (NIFEREX) capsule 150 mg  150 mg Oral Daily Mills, Shnese E, NP   150 mg at 12/30/23 0841   lamoTRIgine  (LAMICTAL ) tablet 25 mg  25 mg Oral BID Angelia Barcelona, NP   25 mg at 12/30/23 1703   levothyroxine  (  SYNTHROID ) tablet 88 mcg  88 mcg Oral Q0600 Mills, Shnese E, NP   88 mcg at 12/30/23 4098   linaclotide   (LINZESS ) capsule 290 mcg  290 mcg Oral Daily Mills, Shnese E, NP   290 mcg at 12/30/23 0840   magnesium  hydroxide (MILK OF MAGNESIA) suspension 30 mL  30 mL Oral Daily PRN Mills, Shnese E, NP       metoprolol  tartrate (LOPRESSOR ) tablet 25 mg  25 mg Oral BID Mills, Shnese E, NP   25 mg at 12/30/23 1703   multivitamin with minerals tablet 1 tablet  1 tablet Oral Daily Jadapalle, Sree, MD   1 tablet at 12/30/23 0857   OLANZapine  (ZYPREXA ) injection 10 mg  10 mg Intramuscular TID PRN Mills, Shnese E, NP       OLANZapine  (ZYPREXA ) injection 5 mg  5 mg Intramuscular TID PRN Mills, Shnese E, NP       OLANZapine  (ZYPREXA ) tablet 15 mg  15 mg Oral QHS Angelia Barcelona, NP   15 mg at 12/29/23 2126   OLANZapine  zydis (ZYPREXA ) disintegrating tablet 5 mg  5 mg Oral TID PRN Mills, Shnese E, NP   5 mg at 12/25/23 2332   pantoprazole  (PROTONIX ) EC tablet 40 mg  40 mg Oral QAC breakfast Mills, Shnese E, NP   40 mg at 12/30/23 0839   polyethylene glycol (MIRALAX  / GLYCOLAX ) packet 17 g  17 g Oral Daily Mills, Shnese E, NP   17 g at 12/30/23 0840   potassium chloride  SA (KLOR-CON  M) CR tablet 20 mEq  20 mEq Oral Daily Mills, Shnese E, NP   20 mEq at 12/30/23 1191   rosuvastatin  (CRESTOR ) tablet 5 mg  5 mg Oral Daily Mills, Shnese E, NP   5 mg at 12/30/23 0840   senna (SENOKOT) tablet 17.2 mg  2 tablet Oral QHS Mills, Shnese E, NP   17.2 mg at 12/29/23 2127    Lab Results:  No results found for this or any previous visit (from the past 48 hours).   Blood Alcohol  level:  Lab Results  Component Value Date   [facility] <15 12/17/2023   ETH <15 11/03/2023    Metabolic Disorder Labs: Lab Results  Component Value Date   HGBA1C 5.1 04/15/2023   MPG 99.67 04/15/2023   MPG 88 12/13/2022   No results found for: PROLACTIN Lab Results  Component Value Date   CHOL 117 12/22/2023   TRIG 45 12/22/2023   HDL 62 12/22/2023   CHOLHDL 1.9 12/22/2023   VLDL 9 12/22/2023   LDLCALC 46 12/22/2023   LDLCALC 43  03/25/2022     Psychiatric Specialty Exam:  Presentation  General Appearance:  Casual  Eye Contact: Fair  Speech: Clear and Coherent  Speech Volume: Normal    Mood and Affect  Mood:-improved today Euthymic  Affect: Congruent   Thought Process  Thought Processes: Coherent  Descriptions of Associations:Intact  Orientation:Full (Time, Place and Person)  Thought Content:Logical  Hallucinations: denies  Ideas of Reference:denies Suicidal Thoughts:denies Homicidal Thoughts:denies  Sensorium  Memory: Immediate Fair; Recent Fair  Judgment: -- (at baseline)  Insight: Shallow   Executive Functions  Concentration:fair Attention Span:fair Recall:fair Fund of Knowledge:fair Language:fair  Psychomotor Activity  Psychomotor Activity:NL Musculoskeletal: Strength & Muscle Tone: within normal limits Gait & Station: normal Assets  Assets:resilience   Physical Exam: Physical Exam Vitals and nursing note reviewed.  HENT:     Head: Atraumatic.   Eyes:     Extraocular Movements: Extraocular movements  intact.   Pulmonary:     Effort: Pulmonary effort is normal.   Neurological:     Mental Status: He is alert.    Review of Systems  Psychiatric/Behavioral:  Negative for depression, hallucinations, substance abuse and suicidal ideas. The patient does not have insomnia.    Blood pressure 129/66, pulse 87, temperature (!) 97.3 F (36.3 C), resp. rate 16, height 6' 4 (1.93 m), weight 80.7 kg, SpO2 100%. Body mass index is 21.67 kg/m.  Diagnosis: Principal Problem:   Schizophrenia (HCC)   PLAN: Safety and Monitoring:  -- Voluntary admission to inpatient psychiatric unit for safety, stabilization and treatment  -- Daily contact with patient to assess and evaluate symptoms and progress in treatment  -- Patient's case to be discussed in multi-disciplinary team meeting  -- Observation Level : q15 minute checks  -- Vital signs:  q12 hours  --  Precautions: suicide, elopement, and assault -- Encouraged patient to participate in unit milieu and in scheduled group therapies  2. Psychiatric Diagnoses and Treatment:  Is doing well today. Working with DSS to find placement.   Continue Zyprexa  15 mg at bedtime Continue Prozac  20 mg daily Continue Buspar  15 mg TID The risks/benefits/side-effects/alternatives to this medication were discussed in detail with the patient and time was given for questions. The patient consents to medication trial. -- Metabolic profile and EKG monitoring obtained while on an atypical antipsychotic  -- Encouraged patient to participate in unit milieu and in scheduled group therapies         3. Medical Issues Being Addressed: Nutrition consult initiated related to weight loss     4. Discharge Planning:   -- Social work and case management to assist with discharge planning and identification of hospital follow-up needs prior to discharge  -- Estimated LOS: 3-4 days  Fay Hoop, PA-C 12/30/2023, 7:31 PM

## 2023-12-30 NOTE — Group Note (Signed)
 Recreation Therapy Group Note   Group Topic:Stress Management  Group Date: 12/30/2023 Start Time: 1100 End Time: 1200 Facilitators: Deatrice Factor, LRT, CTRS Location: Courtyard  Group Description: [employer]. LRT and patients played games of basketball, drew with chalk, and played corn hole while outside in the courtyard while getting fresh air and sunlight. Music was being played in the background. LRT and peers conversed about different games they have played before, what they do in their free time and anything else that is on their minds. LRT encouraged pts to drink water after being outside, sweating and getting their heart rate up.  Goal Area(s) Addressed: Patient will build on frustration tolerance skills. Patients will partake in a competitive play game with peers. Patients will gain knowledge of new leisure interest/hobby.    Affect/Mood: Appropriate   Participation Level: Active   Participation Quality: Independent   Behavior: Appropriate   Speech/Thought Process: Coherent   Insight: Good   Judgement: Good   Modes of Intervention: Activity   Patient Response to Interventions:  Receptive   Education Outcome:  Acknowledges education   Clinical Observations/Individualized Feedback: Bijon was active in their participation of session activities and group discussion. Pt interacted well with LRT and peers duration of session.    Plan: Continue to engage patient in RT group sessions 2-3x/week.   [address] LRT, CTRS 12/30/2023 1:25 PM

## 2023-12-30 NOTE — Progress Notes (Signed)
 Pt was in group earlier today when this chaplain first attempted a visit-Chaplain was called to emergency in ED Have been unable to return-Chaplain services is aware of request for prayer. Chaplain Rana met with pt on 6/18 Myself or on call chaplain tomorrow 6/21 will meet with pt to spend time with him.

## 2023-12-30 NOTE — [facility] Counselor (Signed)
 CSW has sent email attempting to schedule interview for placement.  CSW awaiting response.   Shasta Deist, MSW, LCSW 12/30/2023 3:25 PM

## 2023-12-30 NOTE — Group Note (Signed)
 Recreation Therapy Group Note   Group Topic:Leisure Education  Group Date: 12/30/2023 Start Time: 1530 End Time: 1630 Facilitators: Yvonna Herder, CTRS Location: Craft Room  Group Description: Leisure. Patients were given the option to choose from singing karaoke, coloring mandalas, using oil pastels, journaling, or playing with play-doh. LRT and pts discussed the meaning of leisure, the importance of participating in leisure during their free time/when they're outside of the hospital, as well as how our leisure interests can also serve as coping skills.   Goal Area(s) Addressed:  Patient will identify a current leisure interest.  Patient will learn the definition of "leisure". Patient will practice making a positive decision. Patient will have the opportunity to try a new leisure activity. Patient will communicate with peers and LRT.    Affect/Mood: Appropriate   Participation Level: Active and Engaged   Participation Quality: Independent   Behavior: Alert and Appropriate   Speech/Thought Process: Coherent   Insight: Good   Judgement: Good   Modes of Intervention: Education, Exploration, and Music   Patient Response to Interventions:  Attentive, Engaged, Interested , and Receptive   Education Outcome:  Acknowledges education   Clinical Observations/Individualized Feedback: Shahil was active in their participation of session activities and group discussion. Pt identified video games and listening to music as things he does in his free time. Pt chose to sing karaoke while in group.    Plan: Continue to engage patient in RT group sessions 2-3x/week.   Deatrice Factor, LRT, CTRS 12/30/2023 5:33 PM

## 2023-12-30 NOTE — Progress Notes (Signed)
   12/30/23 0848  Psych Admission Type (Psych Patients Only)  Admission Status Voluntary  Psychosocial Assessment  Patient Complaints Anxiety  Eye Contact Fair  Facial Expression Flat  Affect Blunted  Speech Logical/coherent  Interaction Minimal  Motor Activity Slow  Appearance/Hygiene In scrubs  Behavior Characteristics Appropriate to situation  Mood Preoccupied  Aggressive Behavior  Effect No apparent injury  Thought Process  Coherency WDL  Content WDL  Delusions None reported or observed  Perception WDL  Hallucination None reported or observed  Judgment Limited  Confusion Mild  Danger to Self  Current suicidal ideation?  (Denies)  Self-Injurious Behavior No self-injurious ideation or behavior indicators observed or expressed   Agreement Not to Harm Self Yes  Description of Agreement verbal

## 2023-12-31 DIAGNOSIS — F200 Paranoid schizophrenia: Secondary | ICD-10-CM | POA: Diagnosis not present

## 2023-12-31 NOTE — Progress Notes (Signed)
 Mya, MHT just came up to this Clinical research associate and stated that patient came up to her and stated that he's going to kill himself tonight. MHT also stated that she asked patient if he wanted to talk and he just walked off from her and went back to this room. This Clinical research associate will pass this information along to the oncoming shift.

## 2023-12-31 NOTE — Progress Notes (Signed)
   12/31/23 1500  Psych Admission Type (Psych Patients Only)  Admission Status Voluntary  Psychosocial Assessment  Patient Complaints Depression;Anxiety;Hopelessness (I just have those feelings. States that it is getting better, but rates 10/10 depression and anxiety.)  Eye Contact Fair  Facial Expression Flat  Affect Blunted;Labile  Speech Logical/coherent  Interaction Childlike  Motor Activity Slow  Appearance/Hygiene In scrubs  Behavior Characteristics Agitated  Mood Labile;Anxious (See Assessment.)  Aggressive Behavior  Effect No apparent injury  Thought Process  Coherency WDL  Content WDL  Delusions None reported or observed  Perception WDL  Hallucination None reported or observed  Judgment Limited  Confusion Mild  Danger to Self  Current suicidal ideation? Denies  Self-Injurious Behavior No self-injurious ideation or behavior indicators observed or expressed   Agreement Not to Harm Self Yes  Description of Agreement Verbal

## 2023-12-31 NOTE — Progress Notes (Signed)
 [facility] MD Progress Note  12/31/2023 2:45 PM Derrick Atkins  MRN:  [id number]   Derrick Atkins is a 53yo male with a past psychiatric history of schizophrenia, major depressive disorder, adjustment disorder who presented to the emergency department via law enforcement voluntarily with concerns of worsening depression, hallucinations and thoughts to self-harm.   Per chart review patient voluntarily reported to the hospital with report of chronic pain and difficulty making friends at [facility] as well as experiencing auditory hallucinations that put him down.  He reported hearing these voices daily and has been ongoing for the last 2 years since his mother passed away he also endorsed visions and inability to read people's minds  Subjective:  Chart reviewed, case discussed in multidisciplinary meeting, patient seen during rounds.  Patient engaged for rounds.   6/21: Patient seen for follow-up today they are alert and oriented.  They are pleasant and cooperative.  They continue to have some anxiety about placement but are optimistic about finding placement and potentially getting a new tablet.  Discussed that we will continue to work with DSS and they plan to revisit patient again on Monday.  He continues to deny SI, HI, and AVH.  He feels he is doing well.  Notes some anxiety about how long his length of stay will be as he does not want to be here forever.  Notes stable mood appetite and sleep.  Voices no further concerns or complaints.  6/20: Patient seen for follow-up today.  They are alert and oriented to self and location.  They are pleasant and cooperative on exam.  They discussed that they met with DSS worker yesterday.  We discussed that DSS is working to help him find housing.  He feels that he is doing medically well.  Denies depression notes some anxiety pertaining to his unstable housing situation.  He denies SI, HI, and AVH.  They voiced no further concerns or complaints they were working  on reading a book but had some difficulty and was trying to borrow reading glasses from another patient.  They note sleep and appetite are stable.  They do not require PRNs overnight.  6/19: Patient seen today for follow-up.  They are alert and oriented to self.  They are pleasant and cooperative.  They discussed their meeting this afternoon with DSS and are anxious about where they will be placed.  They are linear and logical on exam.  They deny SI, HI, and AVH.  They do not appear overtly manic or psychotic on exam.  They voiced no concerns or complaints at this time.  They are optimistic about the future.  Affect is bright.  They did not require psychiatric PRNs overnight.   6/18: Patient seen today for follow-up.  They are alert and oriented to self and situation.  They are pleasant and cooperative.  They request access to their tablet because it helps to manage their anxiety discussed that we cannot allow tablet use on the unit.  They have a meeting with DSS tomorrow they are somewhat upset that they have to find new housing but they are agreeable to participating in the meeting.  Continue to encourage to drink Ensure.  Denies SI, HI, and AVH.  Does not appear overtly manic or psychotic.  Voices no concerns or complaints at this time.  6/17: Patient is seen for rounds today.  They are alert and oriented to self and situation.  They are pleasant and cooperative.  They report they had a  difficult day yesterday but are feeling better today.  They deny SI, HI, and AVH.  They do not appear internally preoccupied today they are observed interacting with staff and other patients.  They observed outside playing basketball.  Discussed x-ray results were unremarkable.  Denies adverse effects of medication.  Discussed that DSS will be visiting him to help with placement on Thursday.  He is not observed to be agitated or aggressive.  He voices no concerns or complaints at this time.  Encourage patient to continue  drinking Ensure.  6/16: On exam patient makes minimal eye contact and appears disengaged.  He did appears internally preoccupied he continues to shake his head no 1 question about thoughts of harming himself or others.  Though he denies hallucinations he appears internally preoccupied he has been observed to be in the group room.  Staff note some concern for attention seeking behavior.  Will order x-ray for hand.  Mood was dysphoric with blunted affect.  Will continue current meds consider adding daytime dose of Zyprexa .  He notes no adverse effects of medication.  Does not observe to be aggressive or agitated.  He did require as needed Zyprexa  overnight.  6/15 He has noted to not make eye contact with this provider which is not his normal.  In assessing symptoms he declines to elaborate checking his head.  In assessing for physical symptoms patient shakes his head no, he denies any thoughts to harm himself or anyone else, he denies hallucinations paranoia or delusions.  He is present for breakfast.  This provider asked related to his ankle which he states it is getting better.  Reviewed information patient injured ankle several months ago and injury has been x-rayed several times by foot specialist, he denies any new injury.  Mood is dysphoric today with blunted affect however patient is unable to verbalize why we will continue to monitor for symptoms.  He is not appearing psychotic.  There are no self-harm or aggressive behaviors noted. Review medication changes and he denies any further AH.  Patient is doing well with Zyprexa  15mg  at bedtime. This provider discontinued Haldol  5mg  BID scheduled on admission in favor of monotherapy. He denies suicidal ideation/homicidal ideation.  He is aware unable to return to [facility] and will be awaiting placement options.    Sleep: Good  Appetite:  Fair  Past Psychiatric History: see h&P Family History:  Family History  Problem Relation Age of Onset    Heart disease Father    Heart attack Father    Social History:  Social History   Substance and Sexual Activity  Alcohol  Use No     Social History   Substance and Sexual Activity  Drug Use No    Social History   Socioeconomic History   Marital status: Single    Spouse name: Not on file   Number of children: Not on file   Years of education: Not on file   Highest education level: Not on file  Occupational History   Not on file  Tobacco Use   Smoking status: Never    Passive exposure: Never   Smokeless tobacco: Never  Vaping Use   Vaping status: Never Used  Substance and Sexual Activity   Alcohol  use: No   Drug use: No   Sexual activity: Not Currently  Other Topics Concern   Not on file  Social History Narrative   Not on file   Social Drivers of Health   Financial Resource Strain: Not on  file  Food Insecurity: No Food Insecurity (12/18/2023)   Hunger Vital Sign    Worried About Running Out of Food in the Last Year: Never true    Ran Out of Food in the Last Year: Never true  Transportation Needs: Unmet Transportation Needs (12/18/2023)   PRAPARE - Administrator, Civil Service (Medical): Yes    Lack of Transportation (Non-Medical): Yes  Physical Activity: Not on file  Stress: Not on file  Social Connections: Not on file   Past Medical History:  Past Medical History:  Diagnosis Date   Cerebral palsy (HCC)    Depression    Diabetes mellitus without complication (HCC)    Hypertension    Kidney stones    Schizoaffective disorder (HCC)    Scoliosis     Past Surgical History:  Procedure Laterality Date   BOWEL RESECTION     CATARACT EXTRACTION W/PHACO Right 09/15/2022   Procedure: CATARACT EXTRACTION PHACO AND INTRAOCULAR LENS PLACEMENT (IOC) RIGHT DIABETIC VISION BLUE HEALON 5  15.79  01:31.9;  Surgeon: Mittie Gaskin, MD;  Location: [facility];  Service: Ophthalmology;  Laterality: Right;  Latex Diabetic   INCISION AND DRAINAGE  ABSCESS Left 12/20/2022   Procedure: INCISION AND DRAINAGE ABSCESS;  Surgeon: Lane Shope, MD;  Location: [facility];  Service: General;  Laterality: Left;   LAPAROTOMY N/A 05/15/2020   Procedure: EXPLORATORY LAPAROTOMY;  Surgeon: Desiderio Schanz, MD;  Location: [facility];  Service: General;  Laterality: N/A;    Current Medications: Current Facility-Administered Medications  Medication Dose Route Frequency Provider Last Rate Last Admin   acetaminophen  (TYLENOL ) tablet 1,000 mg  1,000 mg Oral Q6H PRN Debbie Donnice BRAVO, PA-C   1,000 mg at 12/31/23 0043   albuterol  (VENTOLIN  HFA) 108 (90 Base) MCG/ACT inhaler 2 puff  2 puff Inhalation Q4H PRN Mills, Shnese E, NP       alum & mag hydroxide-simeth (MAALOX/MYLANTA) 200-200-20 MG/5ML suspension 30 mL  30 mL Oral Q4H PRN Mills, Shnese E, NP       ascorbic acid  (VITAMIN C ) tablet 500 mg  500 mg Oral Daily Mills, Shnese E, NP   500 mg at 12/31/23 0910   aspirin  EC tablet 81 mg  81 mg Oral Daily Mills, Shnese E, NP   81 mg at 12/31/23 0908   benztropine  (COGENTIN ) tablet 1 mg  1 mg Oral Daily Mills, Shnese E, NP   1 mg at 12/31/23 0910   busPIRone  (BUSPAR ) tablet 15 mg  15 mg Oral TID Mills, Shnese E, NP   15 mg at 12/31/23 1243   cholecalciferol  (VITAMIN D3) 25 MCG (1000 UNIT) tablet 1,000 Units  1,000 Units Oral Daily Mills, Shnese E, NP   1,000 Units at 12/31/23 0910   cyanocobalamin  (VITAMIN B12) tablet 1,000 mcg  1,000 mcg Oral Daily Mills, Shnese E, NP   1,000 mcg at 12/31/23 0907   feeding supplement (ENSURE PLUS HIGH PROTEIN) liquid 237 mL  237 mL Oral TID BM Jadapalle, Sree, MD   237 mL at 12/31/23 0911   fenofibrate  tablet 54 mg  54 mg Oral Daily Mills, Shnese E, NP   54 mg at 12/31/23 0908   FLUoxetine  (PROZAC ) capsule 20 mg  20 mg Oral Daily Mills, Shnese E, NP   20 mg at 12/31/23 0910   folic acid  (FOLVITE ) tablet 1 mg  1 mg Oral Daily Mills, Shnese E, NP   1 mg at 12/31/23 0909   hydrOXYzine  (ATARAX ) tablet 10 mg  10 mg  Oral Daily Mills,  Shnese E, NP   10 mg at 12/31/23 9089   hydrOXYzine  (ATARAX ) tablet 25 mg  25 mg Oral TID PRN Mills, Shnese E, NP   25 mg at 12/31/23 0044   ibuprofen  (ADVIL ) tablet 400 mg  400 mg Oral Q6H PRN Thaddus Mcdowell E, PA-C   400 mg at 12/28/23 2340   iron  polysaccharides (NIFEREX) capsule 150 mg  150 mg Oral Daily Mills, Shnese E, NP   150 mg at 12/31/23 0908   lamoTRIgine  (LAMICTAL ) tablet 25 mg  25 mg Oral BID Cleotilde Hoy HERO, NP   25 mg at 12/31/23 9090   levothyroxine  (SYNTHROID ) tablet 88 mcg  88 mcg Oral Q0600 Moishe Bernadette BRAVO, NP   88 mcg at 12/31/23 1029   linaclotide  (LINZESS ) capsule 290 mcg  290 mcg Oral Daily Mills, Shnese E, NP   290 mcg at 12/31/23 9092   magnesium  hydroxide (MILK OF MAGNESIA) suspension 30 mL  30 mL Oral Daily PRN Mills, Shnese E, NP       metoprolol  tartrate (LOPRESSOR ) tablet 25 mg  25 mg Oral BID Mills, Shnese E, NP   25 mg at 12/31/23 0909   multivitamin with minerals tablet 1 tablet  1 tablet Oral Daily Jadapalle, Sree, MD   1 tablet at 12/31/23 9091   OLANZapine  (ZYPREXA ) injection 10 mg  10 mg Intramuscular TID PRN Mills, Shnese E, NP       OLANZapine  (ZYPREXA ) injection 5 mg  5 mg Intramuscular TID PRN Mills, Shnese E, NP       OLANZapine  (ZYPREXA ) tablet 15 mg  15 mg Oral QHS Cleotilde Hoy HERO, NP   15 mg at 12/30/23 2130   OLANZapine  zydis (ZYPREXA ) disintegrating tablet 5 mg  5 mg Oral TID PRN Mills, Shnese E, NP   5 mg at 12/31/23 1243   pantoprazole  (PROTONIX ) EC tablet 40 mg  40 mg Oral QAC breakfast Mills, Shnese E, NP   40 mg at 12/31/23 0910   polyethylene glycol (MIRALAX  / GLYCOLAX ) packet 17 g  17 g Oral Daily Mills, Shnese E, NP   17 g at 12/31/23 9092   potassium chloride  SA (KLOR-CON  M) CR tablet 20 mEq  20 mEq Oral Daily Moishe Bernadette E, NP   20 mEq at 12/31/23 0910   rosuvastatin  (CRESTOR ) tablet 5 mg  5 mg Oral Daily Mills, Shnese E, NP   5 mg at 12/31/23 0907   senna (SENOKOT) tablet 17.2 mg  2 tablet Oral QHS Mills, Shnese E, NP   17.2 mg  at 12/30/23 2131    Lab Results:  No results found for this or any previous visit (from the past 48 hours).   Blood Alcohol  level:  Lab Results  Component Value Date   [facility] <15 12/17/2023   ETH <15 11/03/2023    Metabolic Disorder Labs: Lab Results  Component Value Date   HGBA1C 5.1 04/15/2023   MPG 99.67 04/15/2023   MPG 88 12/13/2022   No results found for: PROLACTIN Lab Results  Component Value Date   CHOL 117 12/22/2023   TRIG 45 12/22/2023   HDL 62 12/22/2023   CHOLHDL 1.9 12/22/2023   VLDL 9 12/22/2023   LDLCALC 46 12/22/2023   LDLCALC 43 03/25/2022     Psychiatric Specialty Exam:  Presentation  General Appearance:  Casual  Eye Contact: Fair  Speech: Clear and Coherent  Speech Volume: Normal    Mood and Affect  Mood:-improved today Euthymic  Affect: Congruent  Thought Process  Thought Processes: Coherent  Descriptions of Associations:Intact  Orientation:Full (Time, Place and Person)  Thought Content:Logical  Hallucinations: denies  Ideas of Reference:denies Suicidal Thoughts:denies Homicidal Thoughts:denies  Sensorium  Memory: Immediate Fair; Recent Fair  Judgment: -- (at baseline)  Insight: Shallow   Executive Functions  Concentration:fair Attention Span:fair Recall:fair Fund of Knowledge:fair Language:fair  Psychomotor Activity  Psychomotor Activity:NL Musculoskeletal: Strength & Muscle Tone: within normal limits Gait & Station: normal Assets  Assets:resilience   Physical Exam: Physical Exam Vitals and nursing note reviewed.  HENT:     Head: Atraumatic.   Eyes:     Extraocular Movements: Extraocular movements intact.   Pulmonary:     Effort: Pulmonary effort is normal.   Neurological:     Mental Status: He is alert.    Review of Systems  Psychiatric/Behavioral:  Negative for depression, hallucinations, substance abuse and suicidal ideas. The patient does not have insomnia.    Blood  pressure 138/77, pulse 78, temperature (!) 97.3 F (36.3 C), resp. rate 16, height 6' 4 (1.93 m), weight 80.7 kg, SpO2 100%. Body mass index is 21.67 kg/m.  Diagnosis: Principal Problem:   Schizophrenia (HCC)   PLAN: Safety and Monitoring:  -- Voluntary admission to inpatient psychiatric unit for safety, stabilization and treatment  -- Daily contact with patient to assess and evaluate symptoms and progress in treatment  -- Patient's case to be discussed in multi-disciplinary team meeting  -- Observation Level : q15 minute checks  -- Vital signs:  q12 hours  -- Precautions: suicide, elopement, and assault -- Encouraged patient to participate in unit milieu and in scheduled group therapies  2. Psychiatric Diagnoses and Treatment:  Is doing well today. Working with DSS to find placement. DSS to meet with pt again 6/23  Continue Zyprexa  15 mg at bedtime Continue Prozac  20 mg daily Continue Buspar  15 mg TID The risks/benefits/side-effects/alternatives to this medication were discussed in detail with the patient and time was given for questions. The patient consents to medication trial. -- Metabolic profile and EKG monitoring obtained while on an atypical antipsychotic  -- Encouraged patient to participate in unit milieu and in scheduled group therapies         3. Medical Issues Being Addressed: Nutrition consult initiated related to weight loss     4. Discharge Planning:   -- Social work and case management to assist with discharge planning and identification of hospital follow-up needs prior to discharge  -- Estimated LOS: 3-4 days  Donnice FORBES Right, PA-C 12/31/2023, 2:45 PM

## 2023-12-31 NOTE — Progress Notes (Signed)
   12/31/23 1243  Complaints & Interventions  Complains of Agitation  Interventions Medication (see MAR)   Patient came up for afternoon medication and sat in the chair outside of the medication room. Next thing this Clinical research associate knew, patient threw the second chair to the other side of the hallway, walked off to his room and slammed the door. Patient stated to the security guard and nursing student that he was upset that nobody was talking to him.

## 2023-12-31 NOTE — Plan of Care (Signed)
 Patient started the day joking and with a positive mood and affect. This changed around 12:30, where he digressed into a foul mood, made angry faces at staff, and shoved chairs in the hallway before slamming his room door. Upon questioning, informed staff that he was upset that nobody was talking to him. He continued in this mood for the remainder of the shift. Accepted PRNs; effective at decreasing behavioral outbursts, but mood did not improve.  Problem: Education: Goal: Knowledge of [facility] Education information/materials will improve Outcome: Progressing Goal: Emotional status will improve Outcome: Progressing Goal: Mental status will improve Outcome: Progressing Goal: Verbalization of understanding the information provided will improve Outcome: Progressing   Problem: Activity: Goal: Interest or engagement in activities will improve Outcome: Progressing Goal: Sleeping patterns will improve Outcome: Progressing   Problem: Coping: Goal: Ability to verbalize frustrations and anger appropriately will improve Outcome: Progressing Goal: Ability to demonstrate self-control will improve Outcome: Progressing   Problem: Health Behavior/Discharge Planning: Goal: Identification of resources available to assist in meeting health care needs will improve Outcome: Progressing Goal: Compliance with treatment plan for underlying cause of condition will improve Outcome: Progressing   Problem: Physical Regulation: Goal: Ability to maintain clinical measurements within normal limits will improve Outcome: Progressing   Problem: Safety: Goal: Periods of time without injury will increase Outcome: Progressing   Problem: Education: Goal: Utilization of techniques to improve thought processes will improve Outcome: Progressing Goal: Knowledge of the prescribed therapeutic regimen will improve Outcome: Progressing   Problem: Activity: Goal: Interest or engagement in leisure activities  will improve Outcome: Progressing Goal: Imbalance in normal sleep/wake cycle will improve Outcome: Progressing   Problem: Coping: Goal: Coping ability will improve Outcome: Progressing Goal: Will verbalize feelings Outcome: Progressing   Problem: Health Behavior/Discharge Planning: Goal: Ability to make decisions will improve Outcome: Progressing Goal: Compliance with therapeutic regimen will improve Outcome: Progressing   Problem: Role Relationship: Goal: Will demonstrate positive changes in social behaviors and relationships Outcome: Progressing   Problem: Safety: Goal: Ability to disclose and discuss suicidal ideas will improve Outcome: Progressing Goal: Ability to identify and utilize support systems that promote safety will improve Outcome: Progressing   Problem: Self-Concept: Goal: Will verbalize positive feelings about self Outcome: Progressing Goal: Level of anxiety will decrease Outcome: Progressing   Problem: Education: Goal: Ability to make informed decisions regarding treatment will improve Outcome: Progressing   Problem: Coping: Goal: Coping ability will improve Outcome: Progressing   Problem: Health Behavior/Discharge Planning: Goal: Identification of resources available to assist in meeting health care needs will improve Outcome: Progressing   Problem: Medication: Goal: Compliance with prescribed medication regimen will improve Outcome: Progressing   Problem: Self-Concept: Goal: Ability to disclose and discuss suicidal ideas will improve Outcome: Progressing Goal: Will verbalize positive feelings about self Outcome: Progressing

## 2023-12-31 NOTE — Progress Notes (Signed)
 Patient was heard hollering out, while in his room with the door closed. This writer went down and asked if he was ok and he stated no. This Clinical research associate asked patient if he wanted to speak to me or a male nurse on the unit. Patient shook his head no. This Clinical research associate informed patient to notify staff if he needed anything and he verbalized understanding.

## 2023-12-31 NOTE — Group Note (Signed)
 Date:  12/31/2023 Time:  6:07 PM  Group Topic/Focus:  Coping With Mental Health Crisis:   The purpose of this group is to help patients identify strategies for coping with mental health crisis.  Group discusses possible causes of crisis and ways to manage them effectively. Healthy Communication:   The focus of this group is to discuss communication, barriers to communication, as well as healthy ways to communicate with others. Overcoming Stress:   The focus of this group is to define stress and help patients assess their triggers.    Participation Level:  Minimal  Participation Quality:  Appropriate  Affect:  Flat  Cognitive:  Appropriate  Insight: Appropriate  Engagement in Group:  Engaged  Modes of Intervention:  Discussion  Additional Comments:    Laportia Carley L Tywanda Rice 12/31/2023, 6:07 PM

## 2023-12-31 NOTE — Plan of Care (Signed)
  Problem: Activity: Goal: Interest or engagement in activities will improve Outcome: Progressing Goal: Sleeping patterns will improve Outcome: Progressing   Problem: Education: Goal: Knowledge of [facility] Education information/materials will improve Outcome: Progressing Goal: Emotional status will improve Outcome: Progressing Goal: Mental status will improve Outcome: Progressing Goal: Verbalization of understanding the information provided will improve Outcome: Progressing   Problem: Coping: Goal: Ability to verbalize frustrations and anger appropriately will improve Outcome: Progressing Goal: Ability to demonstrate self-control will improve Outcome: Progressing   Problem: Safety: Goal: Ability to disclose and discuss suicidal ideas will improve Outcome: Progressing Goal: Ability to identify and utilize support systems that promote safety will improve Outcome: Progressing

## 2023-12-31 NOTE — Group Note (Signed)
 Date:  12/31/2023 Time:  6:12 PM  Group Topic/Focus:  Coping With Mental Health Crisis:   The purpose of this group is to help patients identify strategies for coping with mental health crisis.  Group discusses possible causes of crisis and ways to manage them effectively. Self Care:   The focus of this group is to help patients understand the importance of self-care in order to improve or restore emotional, physical, spiritual, interpersonal, and financial health.    Participation Level:  Active  Participation Quality:  Appropriate  Affect:  Appropriate  Cognitive:  Appropriate  Insight: Appropriate  Engagement in Group:  Engaged  Modes of Intervention:  Activity  Additional Comments:    Aliou Mealey L Sari Cogan 12/31/2023, 6:12 PM

## 2023-12-31 NOTE — Progress Notes (Addendum)
 Pt was OOB after midnight c/o not being able to sleep and feeling confused. Pt also c/o ankle pain and requested Tylenol . Pt was also given PRN Zyprexa  and Vistaril , staff sat with patient for a brief period and he return to bed and is resting quietly.        12/31/23 0100  Psych Admission Type (Psych Patients Only)  Admission Status Voluntary  Psychosocial Assessment  Patient Complaints Anxiety  Eye Contact Fair  Facial Expression Flat  Affect Blunted  Speech Logical/coherent  Interaction Minimal  Motor Activity Slow  Appearance/Hygiene In scrubs  Behavior Characteristics Appropriate to situation  Mood Preoccupied;Anxious  Thought Process  Coherency WDL  Content WDL  Delusions None reported or observed  Perception WDL  Hallucination None reported or observed  Judgment Limited  Confusion Mild  Danger to Self  Current suicidal ideation? Denies  Self-Injurious Behavior No self-injurious ideation or behavior indicators observed or expressed   Agreement Not to Harm Self Yes  Description of Agreement verbal

## 2023-12-31 NOTE — Group Note (Signed)
 Date:  12/31/2023 Time:  3:39 AM  Group Topic/Focus:  Dimensions of Wellness:   The focus of this group is to introduce the topic of wellness and discuss the role each dimension of wellness plays in total health. Goals Group:   The focus of this group is to help patients establish daily goals to achieve during treatment and discuss how the patient can incorporate goal setting into their daily lives to aide in recovery. Self Care:   The focus of this group is to help patients understand the importance of self-care in order to improve or restore emotional, physical, spiritual, interpersonal, and financial health.    Participation Level:  Minimal  Participation Quality:  Appropriate  Affect:  Flat  Cognitive:  Appropriate and Oriented  Insight: Limited  Engagement in Group:  Limited  Modes of Intervention:  Discussion and Support  Additional Comments:  N/A  Kerri Katz 12/31/2023, 3:39 AM

## 2024-01-01 DIAGNOSIS — F200 Paranoid schizophrenia: Secondary | ICD-10-CM | POA: Diagnosis not present

## 2024-01-01 NOTE — Plan of Care (Signed)

## 2024-01-01 NOTE — Plan of Care (Signed)
  Problem: Education: Goal: Knowledge of [facility] Education information/materials will improve Outcome: Progressing   Problem: Education: Goal: Emotional status will improve Outcome: Progressing   Problem: Education: Goal: Mental status will improve Outcome: Progressing   

## 2024-01-01 NOTE — Group Note (Signed)
 Date:  01/01/2024 Time:  5:19 PM  Group Topic/Focus:  Diagnosis Education:   The focus of this group is to discuss the major disorders that patients maybe diagnosed with.  Group discusses the importance of knowing what one's diagnosis is so that one can understand treatment and better advocate for oneself.    Participation Level:  Active  Participation Quality:  Appropriate  Affect:  Appropriate  Cognitive:  Appropriate  Insight: Appropriate  Engagement in Group:  Engaged  Modes of Intervention:  Education  Additional Comments:  n/a  Ieshia Hatcher 01/01/2024, 5:19 PM

## 2024-01-01 NOTE — Group Note (Signed)
 Date:  01/01/2024 Time:  4:19 PM  Group Topic/Focus:  Making Healthy Choices:   The focus of this group is to help patients identify negative/unhealthy choices they were using prior to admission and identify positive/healthier coping strategies to replace them upon discharge.    Participation Level:  Active  Participation Quality:  Appropriate  Affect:  Appropriate and Flat  Cognitive:  Appropriate  Insight: Appropriate  Engagement in Group:  Engaged and Supportive  Modes of Intervention:  Socialization  Additional Comments:  n/a  Tyra Michelle 01/01/2024, 4:19 PM

## 2024-01-01 NOTE — Progress Notes (Signed)
 [facility] MD Progress Note  01/01/2024 2:24 PM Derrick Atkins  MRN:  [id number]   GLYN GERADS is a 53yo male with a past psychiatric history of schizophrenia, major depressive disorder, adjustment disorder who presented to the emergency department via law enforcement voluntarily with concerns of worsening depression, hallucinations and thoughts to self-harm.   Per chart review patient voluntarily reported to the hospital with report of chronic pain and difficulty making friends at [facility] as well as experiencing auditory hallucinations that put him down.  He reported hearing these voices daily and has been ongoing for the last 2 years since his mother passed away he also endorsed visions and inability to read people's minds  Subjective:  Chart reviewed, case discussed in multidisciplinary meeting, patient seen during rounds.  Patient engaged for rounds.   6/22: Patient seen for follow-up visit they are alert and oriented.  They are pleasant and cooperative.  They continue to have some anxiety about placement however overall feels they are doing well.  They are seen walking unit and engaging with the nursing students.  They do struggle to engage with the other patients at times.  They look forward to the conversation with DSS tomorrow and are open to relocating to [facility] they also feel that the brother would be willing to come visit them in neurology.  They note stable appetite, mood, and sleep.  He denies SI, HI, and AVH.  They voiced no concerns or complaints at this time.  They received as needed Zyprexa  yesterday.  6/21: Patient seen for follow-up today they are alert and oriented.  They are pleasant and cooperative.  They continue to have some anxiety about placement but are optimistic about finding placement and potentially getting a new tablet.  Discussed that we will continue to work with DSS and they plan to revisit patient again on Monday.  He continues to deny SI, HI, and AVH.  He  feels he is doing well.  Notes some anxiety about how long his length of stay will be as he does not want to be here forever.  Notes stable mood appetite and sleep.  Voices no further concerns or complaints.  6/20: Patient seen for follow-up today.  They are alert and oriented to self and location.  They are pleasant and cooperative on exam.  They discussed that they met with DSS worker yesterday.  We discussed that DSS is working to help him find housing.  He feels that he is doing medically well.  Denies depression notes some anxiety pertaining to his unstable housing situation.  He denies SI, HI, and AVH.  They voiced no further concerns or complaints they were working on reading a book but had some difficulty and was trying to borrow reading glasses from another patient.  They note sleep and appetite are stable.  They do not require PRNs overnight.  6/19: Patient seen today for follow-up.  They are alert and oriented to self.  They are pleasant and cooperative.  They discussed their meeting this afternoon with DSS and are anxious about where they will be placed.  They are linear and logical on exam.  They deny SI, HI, and AVH.  They do not appear overtly manic or psychotic on exam.  They voiced no concerns or complaints at this time.  They are optimistic about the future.  Affect is bright.  They did not require psychiatric PRNs overnight.   6/18: Patient seen today for follow-up.  They are alert and oriented  to self and situation.  They are pleasant and cooperative.  They request access to their tablet because it helps to manage their anxiety discussed that we cannot allow tablet use on the unit.  They have a meeting with DSS tomorrow they are somewhat upset that they have to find new housing but they are agreeable to participating in the meeting.  Continue to encourage to drink Ensure.  Denies SI, HI, and AVH.  Does not appear overtly manic or psychotic.  Voices no concerns or complaints at this  time.  6/17: Patient is seen for rounds today.  They are alert and oriented to self and situation.  They are pleasant and cooperative.  They report they had a difficult day yesterday but are feeling better today.  They deny SI, HI, and AVH.  They do not appear internally preoccupied today they are observed interacting with staff and other patients.  They observed outside playing basketball.  Discussed x-ray results were unremarkable.  Denies adverse effects of medication.  Discussed that DSS will be visiting him to help with placement on Thursday.  He is not observed to be agitated or aggressive.  He voices no concerns or complaints at this time.  Encourage patient to continue drinking Ensure.  6/16: On exam patient makes minimal eye contact and appears disengaged.  He did appears internally preoccupied he continues to shake his head no 1 question about thoughts of harming himself or others.  Though he denies hallucinations he appears internally preoccupied he has been observed to be in the group room.  Staff note some concern for attention seeking behavior.  Will order x-ray for hand.  Mood was dysphoric with blunted affect.  Will continue current meds consider adding daytime dose of Zyprexa .  He notes no adverse effects of medication.  Does not observe to be aggressive or agitated.  He did require as needed Zyprexa  overnight.  6/15 He has noted to not make eye contact with this provider which is not his normal.  In assessing symptoms he declines to elaborate checking his head.  In assessing for physical symptoms patient shakes his head no, he denies any thoughts to harm himself or anyone else, he denies hallucinations paranoia or delusions.  He is present for breakfast.  This provider asked related to his ankle which he states it is getting better.  Reviewed information patient injured ankle several months ago and injury has been x-rayed several times by foot specialist, he denies any new injury.  Mood is  dysphoric today with blunted affect however patient is unable to verbalize why we will continue to monitor for symptoms.  He is not appearing psychotic.  There are no self-harm or aggressive behaviors noted. Review medication changes and he denies any further AH.  Patient is doing well with Zyprexa  15mg  at bedtime. This provider discontinued Haldol  5mg  BID scheduled on admission in favor of monotherapy. He denies suicidal ideation/homicidal ideation.  He is aware unable to return to [facility] and will be awaiting placement options.    Sleep: Good  Appetite:  Fair  Past Psychiatric History: see h&P Family History:  Family History  Problem Relation Age of Onset   Heart disease Father    Heart attack Father    Social History:  Social History   Substance and Sexual Activity  Alcohol  Use No     Social History   Substance and Sexual Activity  Drug Use No    Social History   Socioeconomic History   Marital  status: Single    Spouse name: Not on file   Number of children: Not on file   Years of education: Not on file   Highest education level: Not on file  Occupational History   Not on file  Tobacco Use   Smoking status: Never    Passive exposure: Never   Smokeless tobacco: Never  Vaping Use   Vaping status: Never Used  Substance and Sexual Activity   Alcohol  use: No   Drug use: No   Sexual activity: Not Currently  Other Topics Concern   Not on file  Social History Narrative   Not on file   Social Drivers of Health   Financial Resource Strain: Not on file  Food Insecurity: No Food Insecurity (12/18/2023)   Hunger Vital Sign    Worried About Running Out of Food in the Last Year: Never true    Ran Out of Food in the Last Year: Never true  Transportation Needs: Unmet Transportation Needs (12/18/2023)   PRAPARE - Administrator, Civil Service (Medical): Yes    Lack of Transportation (Non-Medical): Yes  Physical Activity: Not on file  Stress: Not on file   Social Connections: Not on file   Past Medical History:  Past Medical History:  Diagnosis Date   Cerebral palsy (HCC)    Depression    Diabetes mellitus without complication (HCC)    Hypertension    Kidney stones    Schizoaffective disorder (HCC)    Scoliosis     Past Surgical History:  Procedure Laterality Date   BOWEL RESECTION     CATARACT EXTRACTION W/PHACO Right 09/15/2022   Procedure: CATARACT EXTRACTION PHACO AND INTRAOCULAR LENS PLACEMENT (IOC) RIGHT DIABETIC VISION BLUE HEALON 5  15.79  01:31.9;  Surgeon: Mittie Gaskin, MD;  Location: [facility];  Service: Ophthalmology;  Laterality: Right;  Latex Diabetic   INCISION AND DRAINAGE ABSCESS Left 12/20/2022   Procedure: INCISION AND DRAINAGE ABSCESS;  Surgeon: Lane Shope, MD;  Location: [facility];  Service: General;  Laterality: Left;   LAPAROTOMY N/A 05/15/2020   Procedure: EXPLORATORY LAPAROTOMY;  Surgeon: Desiderio Schanz, MD;  Location: [facility];  Service: General;  Laterality: N/A;    Current Medications: Current Facility-Administered Medications  Medication Dose Route Frequency Provider Last Rate Last Admin   acetaminophen  (TYLENOL ) tablet 1,000 mg  1,000 mg Oral Q6H PRN Debbie Donnice BRAVO, PA-C   1,000 mg at 12/31/23 0043   albuterol  (VENTOLIN  HFA) 108 (90 Base) MCG/ACT inhaler 2 puff  2 puff Inhalation Q4H PRN Mills, Shnese E, NP       alum & mag hydroxide-simeth (MAALOX/MYLANTA) 200-200-20 MG/5ML suspension 30 mL  30 mL Oral Q4H PRN Mills, Shnese E, NP       ascorbic acid  (VITAMIN C ) tablet 500 mg  500 mg Oral Daily Mills, Shnese E, NP   500 mg at 01/01/24 0803   aspirin  EC tablet 81 mg  81 mg Oral Daily Mills, Shnese E, NP   81 mg at 01/01/24 0804   benztropine  (COGENTIN ) tablet 1 mg  1 mg Oral Daily Mills, Shnese E, NP   1 mg at 01/01/24 0802   busPIRone  (BUSPAR ) tablet 15 mg  15 mg Oral TID Mills, Shnese E, NP   15 mg at 01/01/24 1153   cholecalciferol  (VITAMIN D3) 25 MCG (1000 UNIT) tablet 1,000  Units  1,000 Units Oral Daily Mills, Shnese E, NP   1,000 Units at 01/01/24 0802   cyanocobalamin  (VITAMIN B12) tablet 1,000  mcg  1,000 mcg Oral Daily Mills, Shnese E, NP   1,000 mcg at 01/01/24 0804   feeding supplement (ENSURE PLUS HIGH PROTEIN) liquid 237 mL  237 mL Oral TID BM Jadapalle, Sree, MD   237 mL at 01/01/24 9046   fenofibrate  tablet 54 mg  54 mg Oral Daily Mills, Shnese E, NP   54 mg at 01/01/24 9196   FLUoxetine  (PROZAC ) capsule 20 mg  20 mg Oral Daily Mills, Shnese E, NP   20 mg at 01/01/24 0802   folic acid  (FOLVITE ) tablet 1 mg  1 mg Oral Daily Mills, Shnese E, NP   1 mg at 01/01/24 9196   hydrOXYzine  (ATARAX ) tablet 10 mg  10 mg Oral Daily Mills, Shnese E, NP   10 mg at 01/01/24 9196   hydrOXYzine  (ATARAX ) tablet 25 mg  25 mg Oral TID PRN Mills, Shnese E, NP   25 mg at 12/31/23 0044   ibuprofen  (ADVIL ) tablet 400 mg  400 mg Oral Q6H PRN Shalayne Leach E, PA-C   400 mg at 01/01/24 1154   iron  polysaccharides (NIFEREX) capsule 150 mg  150 mg Oral Daily Mills, Shnese E, NP   150 mg at 01/01/24 0803   lamoTRIgine  (LAMICTAL ) tablet 25 mg  25 mg Oral BID Cleotilde Hoy HERO, NP   25 mg at 01/01/24 0803   levothyroxine  (SYNTHROID ) tablet 88 mcg  88 mcg Oral Q0600 Mills, Shnese E, NP   88 mcg at 01/01/24 0600   linaclotide  (LINZESS ) capsule 290 mcg  290 mcg Oral Daily Mills, Shnese E, NP   290 mcg at 01/01/24 9196   magnesium  hydroxide (MILK OF MAGNESIA) suspension 30 mL  30 mL Oral Daily PRN Mills, Shnese E, NP       metoprolol  tartrate (LOPRESSOR ) tablet 25 mg  25 mg Oral BID Mills, Shnese E, NP   25 mg at 01/01/24 0802   multivitamin with minerals tablet 1 tablet  1 tablet Oral Daily Jadapalle, Sree, MD   1 tablet at 01/01/24 0802   OLANZapine  (ZYPREXA ) injection 10 mg  10 mg Intramuscular TID PRN Mills, Shnese E, NP       OLANZapine  (ZYPREXA ) injection 5 mg  5 mg Intramuscular TID PRN Mills, Shnese E, NP       OLANZapine  (ZYPREXA ) tablet 15 mg  15 mg Oral QHS Cleotilde Hoy HERO,  NP   15 mg at 12/31/23 2145   OLANZapine  zydis (ZYPREXA ) disintegrating tablet 5 mg  5 mg Oral TID PRN Mills, Shnese E, NP   5 mg at 12/31/23 1243   pantoprazole  (PROTONIX ) EC tablet 40 mg  40 mg Oral QAC breakfast Mills, Shnese E, NP   40 mg at 01/01/24 0803   polyethylene glycol (MIRALAX  / GLYCOLAX ) packet 17 g  17 g Oral Daily Mills, Shnese E, NP   17 g at 01/01/24 0801   potassium chloride  SA (KLOR-CON  M) CR tablet 20 mEq  20 mEq Oral Daily Mills, Shnese E, NP   20 mEq at 01/01/24 9196   rosuvastatin  (CRESTOR ) tablet 5 mg  5 mg Oral Daily Mills, Shnese E, NP   5 mg at 01/01/24 0804   senna (SENOKOT) tablet 17.2 mg  2 tablet Oral QHS Mills, Shnese E, NP   17.2 mg at 12/31/23 2145    Lab Results:  No results found for this or any previous visit (from the past 48 hours).   Blood Alcohol  level:  Lab Results  Component Value Date   ETH <15  12/17/2023   ETH <15 11/03/2023    Metabolic Disorder Labs: Lab Results  Component Value Date   HGBA1C 5.1 04/15/2023   MPG 99.67 04/15/2023   MPG 88 12/13/2022   No results found for: PROLACTIN Lab Results  Component Value Date   CHOL 117 12/22/2023   TRIG 45 12/22/2023   HDL 62 12/22/2023   CHOLHDL 1.9 12/22/2023   VLDL 9 12/22/2023   LDLCALC 46 12/22/2023   LDLCALC 43 03/25/2022     Psychiatric Specialty Exam:  Presentation  General Appearance:  Casual  Eye Contact: Fair  Speech: Clear and Coherent  Speech Volume: Decreased    Mood and Affect  Mood:-improved today Euthymic  Affect: Congruent   Thought Process  Thought Processes: Coherent  Descriptions of Associations:Intact  Orientation:Full (Time, Place and Person)  Thought Content:WDL  Hallucinations: denies  Ideas of Reference:denies Suicidal Thoughts:denies Homicidal Thoughts:denies  Sensorium  Memory: Immediate Fair; Recent Fair  Judgment: -- (at baseline)  Insight: Shallow   Executive Functions  Concentration:fair Attention  Span:fair Recall:fair Fund of Knowledge:fair Language:fair  Psychomotor Activity  Psychomotor Activity:NL Musculoskeletal: Strength & Muscle Tone: within normal limits Gait & Station: normal Assets  Assets:resilience   Physical Exam: Physical Exam Vitals and nursing note reviewed.  HENT:     Head: Atraumatic.   Eyes:     Extraocular Movements: Extraocular movements intact.   Pulmonary:     Effort: Pulmonary effort is normal.   Neurological:     Mental Status: He is alert.    Review of Systems  Psychiatric/Behavioral:  Negative for depression, hallucinations, substance abuse and suicidal ideas. The patient does not have insomnia.    Blood pressure 134/67, pulse 63, temperature 98.1 F (36.7 C), resp. rate 18, height 6' 4 (1.93 m), weight 80.7 kg, SpO2 99%. Body mass index is 21.67 kg/m.  Diagnosis: Principal Problem:   Schizophrenia (HCC)   PLAN: Safety and Monitoring:  -- Voluntary admission to inpatient psychiatric unit for safety, stabilization and treatment  -- Daily contact with patient to assess and evaluate symptoms and progress in treatment  -- Patient's case to be discussed in multi-disciplinary team meeting  -- Observation Level : q15 minute checks  -- Vital signs:  q12 hours  -- Precautions: suicide, elopement, and assault -- Encouraged patient to participate in unit milieu and in scheduled group therapies  2. Psychiatric Diagnoses and Treatment:  Is doing well today. Working with DSS to find placement. DSS to meet with pt again 6/23  Continue Zyprexa  15 mg at bedtime Continue Prozac  20 mg daily Continue Buspar  15 mg TID The risks/benefits/side-effects/alternatives to this medication were discussed in detail with the patient and time was given for questions. The patient consents to medication trial. -- Metabolic profile and EKG monitoring obtained while on an atypical antipsychotic  -- Encouraged patient to participate in unit milieu and in  scheduled group therapies         3. Medical Issues Being Addressed: Nutrition consult initiated related to weight loss     4. Discharge Planning:     -- Social work and case management to assist with discharge planning and identification of hospital follow-up needs prior to discharge  -- Estimated LOS: 3-4 days  Donnice FORBES Right, PA-C 01/01/2024, 2:24 PM

## 2024-01-01 NOTE — Progress Notes (Signed)
   12/31/23 2000  Psych Admission Type (Psych Patients Only)  Admission Status Voluntary  Psychosocial Assessment  Patient Complaints Depression  Eye Contact Fair  Facial Expression Flat  Affect Depressed  Speech Logical/coherent  Interaction Minimal  Motor Activity Slow  Appearance/Hygiene In scrubs  Behavior Characteristics Cooperative;Appropriate to situation;Calm  Mood Pleasant  Aggressive Behavior  Effect No apparent injury  Thought Process  Coherency WDL  Content WDL  Delusions None reported or observed  Perception WDL  Hallucination None reported or observed  Judgment Limited  Confusion Mild  Danger to Self  Current suicidal ideation? Denies  Self-Injurious Behavior No self-injurious ideation or behavior indicators observed or expressed   Agreement Not to Harm Self Yes  Description of Agreement verbal  Danger to Others  Danger to Others None reported or observed   Patient is alert and oriented x 4, denies SI/HI/AVH interacting appropriately with peers and staff. Patietn is complaint with medication, thoughts are organized and coherent.

## 2024-01-01 NOTE — Progress Notes (Signed)
   01/01/24 1003  Psych Admission Type (Psych Patients Only)  Admission Status Voluntary  Psychosocial Assessment  Patient Complaints Irritability  Eye Contact Fair  Facial Expression Flat  Affect Anxious  Speech Logical/coherent  Interaction Guarded  Motor Activity Slow  Appearance/Hygiene In scrubs  Behavior Characteristics Cooperative  Mood Anxious  Thought Process  Coherency WDL  Content WDL  Delusions None reported or observed  Perception WDL  Hallucination None reported or observed  Judgment Limited  Confusion None  Danger to Self  Current suicidal ideation? Denies  Self-Injurious Behavior No self-injurious ideation or behavior indicators observed or expressed   Agreement Not to Harm Self Yes  Description of Agreement verbal  Danger to Others  Danger to Others None reported or observed

## 2024-01-02 NOTE — Group Note (Signed)
 Recreation Therapy Group Note   Group Topic:Health and Wellness  Group Date: 01/02/2024 Start Time: 1045 End Time: 1135 Facilitators: Celestia Jeoffrey BRAVO, LRT, CTRS Location: Courtyard  Group Description: [employer]. LRT and patients played games of basketball, drew with chalk, and played corn hole while outside in the courtyard while getting fresh air and sunlight. Music was being played in the background. LRT and peers conversed about different games they have played before, what they do in their free time and anything else that is on their minds. LRT encouraged pts to drink water after being outside, sweating and getting their heart rate up.  Goal Area(s) Addressed: Patient will build on frustration tolerance skills. Patients will partake in a competitive play game with peers. Patients will gain knowledge of new leisure interest/hobby.    Affect/Mood: Appropriate   Participation Level: Active and Engaged   Participation Quality: Independent   Behavior: Calm and Cooperative   Speech/Thought Process: Coherent   Insight: Good   Judgement: Good   Modes of Intervention: Exploration, Open Conversation, Socialization, and Support   Patient Response to Interventions:  Attentive, Engaged, Interested , and Receptive   Education Outcome:  Acknowledges education   Clinical Observations/Individualized Feedback: Jeanette was active in their participation of session activities and group discussion. Pt interacted well with LRT and peers duration of session.    Plan: Continue to engage patient in RT group sessions 2-3x/week.   [address], LRT, CTRS 01/02/2024 1:27 PM

## 2024-01-02 NOTE — Plan of Care (Signed)

## 2024-01-02 NOTE — Group Note (Signed)
 Date:  01/02/2024 Time:  7:31 AM  Group Topic/Focus:  Building Self Esteem:   The Focus of this group is helping patients become aware of the effects of self-esteem on their lives, the things they and others do that enhance or undermine their self-esteem, seeing the relationship between their level of self-esteem and the choices they make and learning ways to enhance self-esteem. Goals Group:   The focus of this group is to help patients establish daily goals to achieve during treatment and discuss how the patient can incorporate goal setting into their daily lives to aide in recovery.    Participation Level:  Active  Participation Quality:  Appropriate  Affect:  Anxious and Appropriate  Cognitive:  Alert, Appropriate, and Oriented  Insight: Appropriate  Engagement in Group:  Engaged and Improving  Modes of Intervention:  Discussion and Support  Additional Comments:  N/A  Derrick Atkins 01/02/2024, 7:31 AM

## 2024-01-02 NOTE — Group Note (Signed)
 Date:  01/02/2024 Time:  9:33 PM  Group Topic/Focus:  Wrap-Up Group:   The focus of this group is to help patients review their daily goal of treatment and discuss progress on daily workbooks.    Participation Level:  Minimal  Participation Quality:  Appropriate, Sharing, and Supportive  Affect:  Appropriate and Depressed  Cognitive:  Appropriate  Insight: Appropriate and Good  Engagement in Group:  Limited and Supportive  Modes of Intervention:  Discussion and Role-play  Additional Comments:     Kerri Katz 01/02/2024, 9:33 PM

## 2024-01-02 NOTE — Progress Notes (Signed)
   01/02/24 1048  Psych Admission Type (Psych Patients Only)  Admission Status Voluntary  Psychosocial Assessment  Patient Complaints Anxiety;Insomnia  Eye Contact Fair  Facial Expression Flat  Affect Anxious  Speech Logical/coherent  Interaction Guarded  Motor Activity Slow  Appearance/Hygiene In scrubs  Behavior Characteristics Cooperative  Mood Pleasant  Thought Process  Coherency WDL  Content WDL  Delusions None reported or observed  Perception WDL  Hallucination None reported or observed  Judgment Limited  Confusion None  Danger to Self  Current suicidal ideation? Denies  Self-Injurious Behavior No self-injurious ideation or behavior indicators observed or expressed   Agreement Not to Harm Self Yes  Description of Agreement verbal  Danger to Others  Danger to Others None reported or observed

## 2024-01-02 NOTE — Progress Notes (Signed)
 [facility] MD Progress Note  01/02/2024 12:18 PM Derrick Atkins  MRN:  [id number]   Derrick Atkins is a 53yo male with a past psychiatric history of schizophrenia, major depressive disorder, adjustment disorder who presented to the emergency department via law enforcement voluntarily with concerns of worsening depression, hallucinations and thoughts to self-harm.   Per chart review patient voluntarily reported to the hospital with report of chronic pain and difficulty making friends at [facility] as well as experiencing auditory hallucinations that put him down.  He reported hearing these voices daily and has been ongoing for the last 2 years since his mother passed away he also endorsed visions and inability to read people's minds  Subjective:  Chart reviewed, case discussed in multidisciplinary meeting, patient seen during rounds.  Patient engaged for rounds.   Patient seen for follow-up today.  Reviewed events of stay where he is waiting for placement. He reports mood, sleep and appetite are stable and denies AVH, SI/HI reporting feels safe in this setting. We discussed that DSS is working to help him find housing.  Reviewed taking scheduled lamictal  at 25mg  BID and hydroxyzine  10mg  daily. He is unaware of when medication was started or for what intent. Discuss plan to discontinue these to avoid polypharmacy as indication and necessity is unclear, he agrees and verbalizes understanding. There have been no inappropriate or aggressive behaviors noted in this setting. He is not appearing psychotic.     Sleep: Good  Appetite:  Fair  Past Psychiatric History: see h&P Family History:  Family History  Problem Relation Age of Onset   Heart disease Father    Heart attack Father    Social History:  Social History   Substance and Sexual Activity  Alcohol  Use No     Social History   Substance and Sexual Activity  Drug Use No    Social History   Socioeconomic History   Marital status:  Single    Spouse name: Not on file   Number of children: Not on file   Years of education: Not on file   Highest education level: Not on file  Occupational History   Not on file  Tobacco Use   Smoking status: Never    Passive exposure: Never   Smokeless tobacco: Never  Vaping Use   Vaping status: Never Used  Substance and Sexual Activity   Alcohol  use: No   Drug use: No   Sexual activity: Not Currently  Other Topics Concern   Not on file  Social History Narrative   Not on file   Social Drivers of Health   Financial Resource Strain: Not on file  Food Insecurity: No Food Insecurity (12/18/2023)   Hunger Vital Sign    Worried About Running Out of Food in the Last Year: Never true    Ran Out of Food in the Last Year: Never true  Transportation Needs: Unmet Transportation Needs (12/18/2023)   PRAPARE - Administrator, Civil Service (Medical): Yes    Lack of Transportation (Non-Medical): Yes  Physical Activity: Not on file  Stress: Not on file  Social Connections: Not on file   Past Medical History:  Past Medical History:  Diagnosis Date   Cerebral palsy (HCC)    Depression    Diabetes mellitus without complication (HCC)    Hypertension    Kidney stones    Schizoaffective disorder (HCC)    Scoliosis     Past Surgical History:  Procedure Laterality Date  BOWEL RESECTION     CATARACT EXTRACTION W/PHACO Right 09/15/2022   Procedure: CATARACT EXTRACTION PHACO AND INTRAOCULAR LENS PLACEMENT (IOC) RIGHT DIABETIC VISION BLUE HEALON 5  15.79  01:31.9;  Surgeon: Mittie Gaskin, MD;  Location: [facility];  Service: Ophthalmology;  Laterality: Right;  Latex Diabetic   INCISION AND DRAINAGE ABSCESS Left 12/20/2022   Procedure: INCISION AND DRAINAGE ABSCESS;  Surgeon: Lane Shope, MD;  Location: [facility];  Service: General;  Laterality: Left;   LAPAROTOMY N/A 05/15/2020   Procedure: EXPLORATORY LAPAROTOMY;  Surgeon: Desiderio Schanz, MD;  Location: [facility];   Service: General;  Laterality: N/A;    Current Medications: Current Facility-Administered Medications  Medication Dose Route Frequency Provider Last Rate Last Admin   acetaminophen  (TYLENOL ) tablet 1,000 mg  1,000 mg Oral Q6H PRN Millington, Matthew E, PA-C   1,000 mg at 12/31/23 0043   albuterol  (VENTOLIN  HFA) 108 (90 Base) MCG/ACT inhaler 2 puff  2 puff Inhalation Q4H PRN Mills, Shnese E, NP       alum & mag hydroxide-simeth (MAALOX/MYLANTA) 200-200-20 MG/5ML suspension 30 mL  30 mL Oral Q4H PRN Mills, Shnese E, NP       ascorbic acid  (VITAMIN C ) tablet 500 mg  500 mg Oral Daily Mills, Shnese E, NP   500 mg at 01/02/24 9157   aspirin  EC tablet 81 mg  81 mg Oral Daily Mills, Shnese E, NP   81 mg at 01/02/24 9157   benztropine  (COGENTIN ) tablet 1 mg  1 mg Oral Daily Mills, Shnese E, NP   1 mg at 01/02/24 9156   busPIRone  (BUSPAR ) tablet 15 mg  15 mg Oral TID Mills, Shnese E, NP   15 mg at 01/02/24 1216   cholecalciferol  (VITAMIN D3) 25 MCG (1000 UNIT) tablet 1,000 Units  1,000 Units Oral Daily Mills, Shnese E, NP   1,000 Units at 01/02/24 0843   cyanocobalamin  (VITAMIN B12) tablet 1,000 mcg  1,000 mcg Oral Daily Mills, Shnese E, NP   1,000 mcg at 01/02/24 0844   feeding supplement (ENSURE PLUS HIGH PROTEIN) liquid 237 mL  237 mL Oral TID BM Jadapalle, Sree, MD   237 mL at 01/02/24 1101   fenofibrate  tablet 54 mg  54 mg Oral Daily Mills, Shnese E, NP   54 mg at 01/02/24 9157   FLUoxetine  (PROZAC ) capsule 20 mg  20 mg Oral Daily Mills, Shnese E, NP   20 mg at 01/02/24 9156   folic acid  (FOLVITE ) tablet 1 mg  1 mg Oral Daily Mills, Shnese E, NP   1 mg at 01/02/24 9156   hydrOXYzine  (ATARAX ) tablet 25 mg  25 mg Oral TID PRN Mills, Shnese E, NP   25 mg at 12/31/23 0044   ibuprofen  (ADVIL ) tablet 400 mg  400 mg Oral Q6H PRN Millington, Matthew E, PA-C   400 mg at 01/02/24 1216   iron  polysaccharides (NIFEREX) capsule 150 mg  150 mg Oral Daily Mills, Shnese E, NP   150 mg at 01/02/24 0843    levothyroxine  (SYNTHROID ) tablet 88 mcg  88 mcg Oral Q0600 Moishe Bernadette BRAVO, NP   88 mcg at 01/02/24 0630   linaclotide  (LINZESS ) capsule 290 mcg  290 mcg Oral Daily Mills, Shnese E, NP   290 mcg at 01/02/24 9156   magnesium  hydroxide (MILK OF MAGNESIA) suspension 30 mL  30 mL Oral Daily PRN Mills, Shnese E, NP       metoprolol  tartrate (LOPRESSOR ) tablet 25 mg  25 mg Oral BID Moishe,  Bernadette BRAVO, NP   25 mg at 01/02/24 9157   multivitamin with minerals tablet 1 tablet  1 tablet Oral Daily Jadapalle, Sree, MD   1 tablet at 01/02/24 9157   OLANZapine  (ZYPREXA ) injection 10 mg  10 mg Intramuscular TID PRN Mills, Shnese E, NP       OLANZapine  (ZYPREXA ) injection 5 mg  5 mg Intramuscular TID PRN Mills, Shnese E, NP       OLANZapine  (ZYPREXA ) tablet 15 mg  15 mg Oral QHS Cleotilde Hoy HERO, NP   15 mg at 01/01/24 2137   OLANZapine  zydis (ZYPREXA ) disintegrating tablet 5 mg  5 mg Oral TID PRN Mills, Shnese E, NP   5 mg at 12/31/23 1243   pantoprazole  (PROTONIX ) EC tablet 40 mg  40 mg Oral QAC breakfast Mills, Shnese E, NP   40 mg at 01/02/24 0842   polyethylene glycol (MIRALAX  / GLYCOLAX ) packet 17 g  17 g Oral Daily Mills, Shnese E, NP   17 g at 01/02/24 0841   potassium chloride  SA (KLOR-CON  M) CR tablet 20 mEq  20 mEq Oral Daily Mills, Shnese E, NP   20 mEq at 01/02/24 9156   rosuvastatin  (CRESTOR ) tablet 5 mg  5 mg Oral Daily Mills, Shnese E, NP   5 mg at 01/02/24 9157   senna (SENOKOT) tablet 17.2 mg  2 tablet Oral QHS Mills, Shnese E, NP   17.2 mg at 01/01/24 2137    Lab Results:  No results found for this or any previous visit (from the past 48 hours).   Blood Alcohol  level:  Lab Results  Component Value Date   [facility] <15 12/17/2023   ETH <15 11/03/2023    Metabolic Disorder Labs: Lab Results  Component Value Date   HGBA1C 5.1 04/15/2023   MPG 99.67 04/15/2023   MPG 88 12/13/2022   No results found for: PROLACTIN Lab Results  Component Value Date   CHOL 117 12/22/2023   TRIG 45  12/22/2023   HDL 62 12/22/2023   CHOLHDL 1.9 12/22/2023   VLDL 9 12/22/2023   LDLCALC 46 12/22/2023   LDLCALC 43 03/25/2022     Psychiatric Specialty Exam:  Presentation  General Appearance:  Casual  Eye Contact: Fair  Speech: Clear and Coherent  Speech Volume: Decreased    Mood and Affect  Mood:-improved today Euthymic  Affect: Congruent   Thought Process  Thought Processes: Coherent  Descriptions of Associations:Intact  Orientation:Full (Time, Place and Person)  Thought Content:WDL  Hallucinations: denies  Ideas of Reference:denies Suicidal Thoughts:denies Homicidal Thoughts:denies  Sensorium  Memory: Immediate Fair; Recent Fair  Judgment: -- (at baseline)  Insight: Shallow   Executive Functions  Concentration:fair Attention Span:fair Recall:fair Fund of Knowledge:fair Language:fair  Psychomotor Activity  Psychomotor Activity:NL Musculoskeletal: Strength & Muscle Tone: within normal limits Gait & Station: normal Assets  Assets:resilience   Physical Exam: Physical Exam Vitals and nursing note reviewed.  HENT:     Head: Atraumatic.   Eyes:     Extraocular Movements: Extraocular movements intact.   Pulmonary:     Effort: Pulmonary effort is normal.   Neurological:     Mental Status: He is alert.    Review of Systems  Psychiatric/Behavioral:  Negative for depression, hallucinations, substance abuse and suicidal ideas. The patient does not have insomnia.    Blood pressure 130/75, pulse 78, temperature (!) 97.2 F (36.2 C), resp. rate 18, height 6' 4 (1.93 m), weight 80.7 kg, SpO2 100%. Body mass index is 21.67 kg/m.  Diagnosis: Principal Problem:   Schizophrenia (HCC)   PLAN: Safety and Monitoring:  -- Voluntary admission to inpatient psychiatric unit for safety, stabilization and treatment  -- Daily contact with patient to assess and evaluate symptoms and progress in treatment  -- Patient's case to be discussed  in multi-disciplinary team meeting  -- Observation Level : q15 minute checks  -- Vital signs:  q12 hours  -- Precautions: suicide, elopement, and assault -- Encouraged patient to participate in unit milieu and in scheduled group therapies  2. Psychiatric Diagnoses and Treatment:  Is doing well today. Working with DSS to find placement. DSS to meet with pt again 6/23  Continue Zyprexa  15 mg at bedtime Continue Prozac  20 mg daily Continue Buspar  15 mg TID DC lamictal  25mg  po daily DC hydroxyzine  10mg  po daily The risks/benefits/side-effects/alternatives to this medication were discussed in detail with the patient and time was given for questions. The patient consents to medication trial. -- Metabolic profile and EKG monitoring obtained while on an atypical antipsychotic  -- Encouraged patient to participate in unit milieu and in scheduled group therapies         3. Medical Issues Being Addressed: Nutrition consult initiated related to weight loss     4. Discharge Planning:     -- Social work and case management to assist with discharge planning and identification of hospital follow-up needs prior to discharge  -- needs appropriate and safe discharge disposition.   Hoy CHRISTELLA Pinal, NP 01/02/2024, 12:18 PM

## 2024-01-02 NOTE — Plan of Care (Signed)
  Problem: Education: Goal: Mental status will improve Outcome: Progressing   Problem: Education: Goal: Verbalization of understanding the information provided will improve Outcome: Progressing   Problem: Activity: Goal: Interest or engagement in activities will improve Outcome: Progressing   

## 2024-01-02 NOTE — Group Note (Signed)
 [facility] LCSW Group Therapy Note    Group Date: 01/02/2024 Start Time: 1300 End Time: 1400  Type of Therapy and Topic:  Group Therapy:  Overcoming Obstacles  Participation Level:  [facility] PARTICIPATION LEVEL: Did Not Attend   Description of Group:   In this group patients will be encouraged to explore what they see as obstacles to their own wellness and recovery. They will be guided to discuss their thoughts, feelings, and behaviors related to these obstacles. The group will process together ways to cope with barriers, with attention given to specific choices patients can make. Each patient will be challenged to identify changes they are motivated to make in order to overcome their obstacles. This group will be process-oriented, with patients participating in exploration of their own experiences as well as giving and receiving support and challenge from other group members.  Therapeutic Goals: 1. Patient will identify personal and current obstacles as they relate to admission. 2. Patient will identify barriers that currently interfere with their wellness or overcoming obstacles.  3. Patient will identify feelings, thought process and behaviors related to these barriers. 4. Patient will identify two changes they are willing to make to overcome these obstacles:    Summary of Patient Progress X   Therapeutic Modalities:   Cognitive Behavioral Therapy Solution Focused Therapy Motivational Interviewing Relapse Prevention Therapy   Nadara JONELLE Fam, LCSW

## 2024-01-02 NOTE — Group Note (Signed)
 Date:  01/02/2024 Time:  9:49 AM  Group Topic/Focus:  Personal Choices and Values:   The focus of this group is to help patients assess and explore the importance of values in their lives, how their values affect their decisions, how they express their values and what opposes their expression.    Participation Level:  Active  Participation Quality:    Affect:    Cognitive:    Insight:   Engagement in Group:    Modes of Intervention:    Additional Comments:    Derrick Atkins 01/02/2024, 9:49 AM

## 2024-01-02 NOTE — [facility] Counselor (Signed)
 CSW sat in with patient while he completed interview with Rosana.   Patient did well and was appropriate throughout the interview.   Rosana reports that he thinks patient will be a good fit, however, he will follow up with the patient's guardian.   Sherryle Margo, MSW, LCSW 01/02/2024 3:17 PM

## 2024-01-03 NOTE — Group Note (Signed)
 LCSW Group Therapy Note  Group Date: 01/03/2024 Start Time: 1300 End Time: 1400   Type of Therapy and Topic:  Group Therapy: Anger Cues and Responses  Participation Level:  None   Description of Group:   In this group, patients learned how to recognize the physical, cognitive, emotional, and behavioral responses they have to anger-provoking situations.  They identified a recent time they became angry and how they reacted.  They analyzed how their reaction was possibly beneficial and how it was possibly unhelpful.  The group discussed a variety of healthier coping skills that could help with such a situation in the future.  Focus was placed on how helpful it is to recognize the underlying emotions to our anger, because working on those can lead to a more permanent solution as well as our ability to focus on the important rather than the urgent.  Therapeutic Goals: Patients will remember their last incident of anger and how they felt emotionally and physically, what their thoughts were at the time, and how they behaved. Patients will identify how their behavior at that time worked for them, as well as how it worked against them. Patients will explore possible new behaviors to use in future anger situations. Patients will learn that anger itself is normal and cannot be eliminated, and that healthier reactions can assist with resolving conflict rather than worsening situations.  Summary of Patient Progress:   Patient was present in group, however, did not engage in group discussion.    Therapeutic Modalities:   Cognitive Behavioral Therapy    DOLAN XIA, LCSW 01/03/2024  4:01 PM

## 2024-01-03 NOTE — Group Note (Signed)
 Recreation Therapy Group Note   Group Topic:Coping Skills  Group Date: 01/03/2024 Start Time: 1005 End Time: 1045 Facilitators: Celestia Jeoffrey BRAVO, LRT, CTRS Location: Courtyard  Group Description: [employer]. LRT and patients played games of basketball, drew with chalk, and played corn hole while outside in the courtyard while getting fresh air and sunlight. Music was being played in the background. LRT and peers conversed about different games they have played before, what they do in their free time and anything else that is on their minds. LRT encouraged pts to drink water after being outside, sweating and getting their heart rate up.  Goal Area(s) Addressed: Patient will build on frustration tolerance skills. Patients will partake in a competitive play game with peers. Patients will gain knowledge of new leisure interest/hobby.    Affect/Mood: Appropriate   Participation Level: Active and Engaged   Participation Quality: Independent   Behavior: Appropriate, Calm, and Cooperative   Speech/Thought Process: Coherent   Insight: Good   Judgement: Good   Modes of Intervention: Rapport Building and Socialization   Patient Response to Interventions:  Attentive, Engaged, Interested , and Receptive   Education Outcome:  Acknowledges education   Clinical Observations/Individualized Feedback: Derrick Atkins was active in their participation of session activities and group discussion. Pt interacted well with LRT and peers duration of session.    Plan: Continue to engage patient in RT group sessions 2-3x/week.   Jeoffrey BRAVO Celestia, LRT, CTRS 01/03/2024 1:10 PM

## 2024-01-03 NOTE — Group Note (Signed)
 Date:  01/03/2024 Time:  2:23 PM  Group Topic/Focus:  Healthy Communication:   The focus of this group is to discuss communication, barriers to communication, as well as healthy ways to communicate with others.    Participation Level:  Active  Participation Quality:  Appropriate  Affect:  Appropriate  Cognitive:  Appropriate  Insight: Appropriate  Engagement in Group:  Engaged  Modes of Intervention:  Activity  Additional Comments:    Ashaun Gaughan 01/03/2024, 2:23 PM

## 2024-01-03 NOTE — Group Note (Signed)
 Date:  01/03/2024 Time:  2:49 PM  Group Topic/Focus:  Goals Group:   The focus of this group is to help patients establish daily goals to achieve during treatment and discuss how the patient can incorporate goal setting into their daily lives to aide in recovery.    Participation Level:  Did Not Attend  Participation Quality:    Affect:    Cognitive:    Insight:   Engagement in Group:    Modes of Intervention:    Additional Comments:    Tasharra Nodine 01/03/2024, 2:49 PM

## 2024-01-03 NOTE — Plan of Care (Signed)

## 2024-01-03 NOTE — Progress Notes (Signed)
   01/03/24 2215  Psych Admission Type (Psych Patients Only)  Admission Status Voluntary  Psychosocial Assessment  Patient Complaints None  Eye Contact Brief  Facial Expression Flat  Affect Appropriate to circumstance  Speech Logical/coherent  Interaction Guarded  Motor Activity Slow  Appearance/Hygiene In scrubs  Behavior Characteristics Cooperative;Appropriate to situation  Mood Pleasant  Aggressive Behavior  Effect No apparent injury  Thought Process  Coherency WDL  Content WDL  Delusions None reported or observed  Perception WDL  Hallucination None reported or observed  Judgment Limited  Confusion None  Danger to Self  Current suicidal ideation? Denies  Agreement Not to Harm Self Yes  Description of Agreement verbal  Danger to Others  Danger to Others None reported or observed

## 2024-01-03 NOTE — Progress Notes (Signed)
 [facility] MD Progress Note  01/03/2024 8:58 AM Derrick Atkins  MRN:  [id number]   Derrick Atkins is a 53yo male with a past psychiatric history of schizophrenia, major depressive disorder, adjustment disorder who presented to the emergency department via law enforcement voluntarily with concerns of worsening depression, hallucinations and thoughts to self-harm.   Per chart review patient voluntarily reported to the hospital with report of chronic pain and difficulty making friends at [facility] as well as experiencing auditory hallucinations that put him down.  He reported hearing these voices daily and has been ongoing for the last 2 years since his mother passed away he also endorsed visions and inability to read people's minds  Subjective:  Chart reviewed, case discussed in multidisciplinary meeting, patient seen during rounds.  Patient engaged for rounds.    01/03/24: Patient seen today denies any adverse effects to discontinuation of Lamictal  or Hydroxyzine . No changes in assessment. DSS continues to search for housing. Denies suicidal homicidal ideations. Denies hallucinations, paranoia, and delusions. He is appearing disheveled and depressed with flat affect. However, brightens when engages with peers. There have been no inappropriate or aggressive behaviors noted in this setting. Medications have been titrated to effective dose to address psychosis.    01/02/24: Patient seen for follow-up today.  Reviewed events of stay where he is waiting for placement. He reports mood, sleep and appetite are stable and denies AVH, SI/HI reporting feels safe in this setting. We discussed that DSS is working to help him find housing.  Reviewed taking scheduled lamictal  at 25mg  BID and hydroxyzine  10mg  daily. He is unaware of when medication was started or for what intent. Discuss plan to discontinue these to avoid polypharmacy as indication and necessity is unclear, he agrees and verbalizes understanding.  There have been no inappropriate or aggressive behaviors noted in this setting. He is not appearing psychotic.      Sleep: Good  Appetite:  Fair  Past Psychiatric History: see h&P Family History:  Family History  Problem Relation Age of Onset   Heart disease Father    Heart attack Father    Social History:  Social History   Substance and Sexual Activity  Alcohol  Use No     Social History   Substance and Sexual Activity  Drug Use No    Social History   Socioeconomic History   Marital status: Single    Spouse name: Not on file   Number of children: Not on file   Years of education: Not on file   Highest education level: Not on file  Occupational History   Not on file  Tobacco Use   Smoking status: Never    Passive exposure: Never   Smokeless tobacco: Never  Vaping Use   Vaping status: Never Used  Substance and Sexual Activity   Alcohol  use: No   Drug use: No   Sexual activity: Not Currently  Other Topics Concern   Not on file  Social History Narrative   Not on file   Social Drivers of Health   Financial Resource Strain: Not on file  Food Insecurity: No Food Insecurity (12/18/2023)   Hunger Vital Sign    Worried About Running Out of Food in the Last Year: Never true    Ran Out of Food in the Last Year: Never true  Transportation Needs: Unmet Transportation Needs (12/18/2023)   PRAPARE - Administrator, Civil Service (Medical): Yes    Lack of Transportation (Non-Medical): Yes  Physical  Activity: Not on file  Stress: Not on file  Social Connections: Not on file   Past Medical History:  Past Medical History:  Diagnosis Date   Cerebral palsy (HCC)    Depression    Diabetes mellitus without complication (HCC)    Hypertension    Kidney stones    Schizoaffective disorder (HCC)    Scoliosis     Past Surgical History:  Procedure Laterality Date   BOWEL RESECTION     CATARACT EXTRACTION W/PHACO Right 09/15/2022   Procedure: CATARACT EXTRACTION  PHACO AND INTRAOCULAR LENS PLACEMENT (IOC) RIGHT DIABETIC VISION BLUE HEALON 5  15.79  01:31.9;  Surgeon: Mittie Gaskin, MD;  Location: [facility];  Service: Ophthalmology;  Laterality: Right;  Latex Diabetic   INCISION AND DRAINAGE ABSCESS Left 12/20/2022   Procedure: INCISION AND DRAINAGE ABSCESS;  Surgeon: Lane Shope, MD;  Location: [facility];  Service: General;  Laterality: Left;   LAPAROTOMY N/A 05/15/2020   Procedure: EXPLORATORY LAPAROTOMY;  Surgeon: Desiderio Schanz, MD;  Location: [facility];  Service: General;  Laterality: N/A;    Current Medications: Current Facility-Administered Medications  Medication Dose Route Frequency Provider Last Rate Last Admin   acetaminophen  (TYLENOL ) tablet 1,000 mg  1,000 mg Oral Q6H PRN Debbie Donnice BRAVO, PA-C   1,000 mg at 12/31/23 0043   albuterol  (VENTOLIN  HFA) 108 (90 Base) MCG/ACT inhaler 2 puff  2 puff Inhalation Q4H PRN Mills, Shnese E, NP       alum & mag hydroxide-simeth (MAALOX/MYLANTA) 200-200-20 MG/5ML suspension 30 mL  30 mL Oral Q4H PRN Mills, Shnese E, NP       ascorbic acid  (VITAMIN C ) tablet 500 mg  500 mg Oral Daily Mills, Shnese E, NP   500 mg at 01/03/24 0851   aspirin  EC tablet 81 mg  81 mg Oral Daily Mills, Shnese E, NP   81 mg at 01/03/24 0850   benztropine  (COGENTIN ) tablet 1 mg  1 mg Oral Daily Mills, Shnese E, NP   1 mg at 01/03/24 0850   busPIRone  (BUSPAR ) tablet 15 mg  15 mg Oral TID Mills, Shnese E, NP   15 mg at 01/03/24 0850   cholecalciferol  (VITAMIN D3) 25 MCG (1000 UNIT) tablet 1,000 Units  1,000 Units Oral Daily Mills, Shnese E, NP   1,000 Units at 01/03/24 9148   cyanocobalamin  (VITAMIN B12) tablet 1,000 mcg  1,000 mcg Oral Daily Mills, Shnese E, NP   1,000 mcg at 01/03/24 0851   feeding supplement (ENSURE PLUS HIGH PROTEIN) liquid 237 mL  237 mL Oral TID BM Jadapalle, Sree, MD   237 mL at 01/02/24 2015   fenofibrate  tablet 54 mg  54 mg Oral Daily Moishe Burton E, NP   54 mg at 01/03/24 9148    FLUoxetine  (PROZAC ) capsule 20 mg  20 mg Oral Daily Mills, Shnese E, NP   20 mg at 01/03/24 0851   folic acid  (FOLVITE ) tablet 1 mg  1 mg Oral Daily Mills, Shnese E, NP   1 mg at 01/03/24 0851   hydrOXYzine  (ATARAX ) tablet 25 mg  25 mg Oral TID PRN Mills, Shnese E, NP   25 mg at 12/31/23 0044   ibuprofen  (ADVIL ) tablet 400 mg  400 mg Oral Q6H PRN Millington, Matthew E, PA-C   400 mg at 01/02/24 1216   iron  polysaccharides (NIFEREX) capsule 150 mg  150 mg Oral Daily Mills, Shnese E, NP   150 mg at 01/03/24 0851   levothyroxine  (SYNTHROID ) tablet 88 mcg  88 mcg Oral Q0600 Moishe Bernadette BRAVO, NP   88 mcg at 01/03/24 0525   linaclotide  (LINZESS ) capsule 290 mcg  290 mcg Oral Daily Mills, Shnese E, NP   290 mcg at 01/03/24 9148   magnesium  hydroxide (MILK OF MAGNESIA) suspension 30 mL  30 mL Oral Daily PRN Mills, Shnese E, NP       metoprolol  tartrate (LOPRESSOR ) tablet 25 mg  25 mg Oral BID Mills, Shnese E, NP   25 mg at 01/03/24 9149   multivitamin with minerals tablet 1 tablet  1 tablet Oral Daily Jadapalle, Sree, MD   1 tablet at 01/03/24 9149   OLANZapine  (ZYPREXA ) injection 10 mg  10 mg Intramuscular TID PRN Moishe Bernadette BRAVO, NP       OLANZapine  (ZYPREXA ) injection 5 mg  5 mg Intramuscular TID PRN Mills, Shnese E, NP       OLANZapine  (ZYPREXA ) tablet 15 mg  15 mg Oral QHS Cleotilde Hoy HERO, NP   15 mg at 01/02/24 2120   OLANZapine  zydis (ZYPREXA ) disintegrating tablet 5 mg  5 mg Oral TID PRN Mills, Shnese E, NP   5 mg at 12/31/23 1243   pantoprazole  (PROTONIX ) EC tablet 40 mg  40 mg Oral QAC breakfast Mills, Shnese E, NP   40 mg at 01/03/24 0851   polyethylene glycol (MIRALAX  / GLYCOLAX ) packet 17 g  17 g Oral Daily Mills, Shnese E, NP   17 g at 01/03/24 9150   potassium chloride  SA (KLOR-CON  M) CR tablet 20 mEq  20 mEq Oral Daily Moishe Bernadette E, NP   20 mEq at 01/03/24 0850   rosuvastatin  (CRESTOR ) tablet 5 mg  5 mg Oral Daily Mills, Shnese E, NP   5 mg at 01/03/24 0851   senna (SENOKOT) tablet  17.2 mg  2 tablet Oral QHS Mills, Shnese E, NP   17.2 mg at 01/02/24 2120    Lab Results:  No results found for this or any previous visit (from the past 48 hours).   Blood Alcohol  level:  Lab Results  Component Value Date   [facility] <15 12/17/2023   ETH <15 11/03/2023    Metabolic Disorder Labs: Lab Results  Component Value Date   HGBA1C 5.1 04/15/2023   MPG 99.67 04/15/2023   MPG 88 12/13/2022   No results found for: PROLACTIN Lab Results  Component Value Date   CHOL 117 12/22/2023   TRIG 45 12/22/2023   HDL 62 12/22/2023   CHOLHDL 1.9 12/22/2023   VLDL 9 12/22/2023   LDLCALC 46 12/22/2023   LDLCALC 43 03/25/2022     Psychiatric Specialty Exam:  Presentation  General Appearance:  Casual  Eye Contact: Fair  Speech: Clear and Coherent  Speech Volume: Decreased    Mood and Affect  Mood:-improved today Euthymic  Affect: Congruent   Thought Process  Thought Processes: Coherent  Descriptions of Associations:Intact  Orientation:Full (Time, Place and Person)  Thought Content:WDL  Hallucinations: denies  Ideas of Reference:denies Suicidal Thoughts:denies Homicidal Thoughts:denies  Sensorium  Memory: Immediate Fair; Recent Fair  Judgment: -- (at baseline)  Insight: Shallow   Executive Functions  Concentration:fair Attention Span:fair Recall:fair Fund of Knowledge:fair Language:fair  Psychomotor Activity  Psychomotor Activity:NL Musculoskeletal: Strength & Muscle Tone: within normal limits Gait & Station: normal Assets  Assets:resilience   Physical Exam: Physical Exam Vitals and nursing note reviewed.  HENT:     Head: Atraumatic.   Eyes:     Extraocular Movements: Extraocular movements intact.   Pulmonary:  Effort: Pulmonary effort is normal.   Neurological:     Mental Status: He is alert.    Review of Systems  Psychiatric/Behavioral:  Negative for depression, hallucinations, substance abuse and suicidal  ideas. The patient does not have insomnia.    Blood pressure 134/70, pulse 83, temperature 97.7 F (36.5 C), resp. rate 18, height 6' 4 (1.93 m), weight 80.7 kg, SpO2 100%. Body mass index is 21.67 kg/m.  Diagnosis: Principal Problem:   Schizophrenia (HCC)   PLAN: Safety and Monitoring:  -- Voluntary admission to inpatient psychiatric unit for safety, stabilization and treatment  -- Daily contact with patient to assess and evaluate symptoms and progress in treatment  -- Patient's case to be discussed in multi-disciplinary team meeting  -- Observation Level : q15 minute checks  -- Vital signs:  q12 hours  -- Precautions: suicide, elopement, and assault -- Encouraged patient to participate in unit milieu and in scheduled group therapies  2. Psychiatric Diagnoses and Treatment:  Is doing well today. Working with DSS to find placement. DSS to meet with pt again 6/23  Continue Zyprexa  15 mg at bedtime Continue Prozac  20 mg daily Continue Buspar  15 mg TID DC lamictal  25mg  po daily DC hydroxyzine  10mg  po daily The risks/benefits/side-effects/alternatives to this medication were discussed in detail with the patient and time was given for questions. The patient consents to medication trial. -- Metabolic profile and EKG monitoring obtained while on an atypical antipsychotic  -- Encouraged patient to participate in unit milieu and in scheduled group therapies         3. Medical Issues Being Addressed: Nutrition consult initiated related to weight loss     4. Discharge Planning:     -- Social work and case management to assist with discharge planning and identification of hospital follow-up needs prior to discharge  -- needs appropriate and safe discharge disposition.   Hoy CHRISTELLA Pinal, NP 01/03/2024, 8:58 AM

## 2024-01-03 NOTE — Plan of Care (Signed)

## 2024-01-03 NOTE — Plan of Care (Signed)
  Problem: Education: Goal: Knowledge of [facility] Education information/materials will improve Outcome: Progressing Goal: Emotional status will improve Outcome: Progressing Goal: Mental status will improve Outcome: Progressing Goal: Verbalization of understanding the information provided will improve Outcome: Progressing   Problem: Activity: Goal: Interest or engagement in activities will improve Outcome: Progressing Goal: Sleeping patterns will improve Outcome: Progressing   Problem: Coping: Goal: Ability to verbalize frustrations and anger appropriately will improve Outcome: Progressing Goal: Ability to demonstrate self-control will improve Outcome: Progressing   Problem: Health Behavior/Discharge Planning: Goal: Identification of resources available to assist in meeting health care needs will improve Outcome: Progressing Goal: Compliance with treatment plan for underlying cause of condition will improve Outcome: Progressing   Problem: Physical Regulation: Goal: Ability to maintain clinical measurements within normal limits will improve Outcome: Progressing   Problem: Safety: Goal: Periods of time without injury will increase Outcome: Progressing   Problem: Education: Goal: Utilization of techniques to improve thought processes will improve Outcome: Progressing Goal: Knowledge of the prescribed therapeutic regimen will improve Outcome: Progressing   Problem: Activity: Goal: Interest or engagement in leisure activities will improve Outcome: Progressing Goal: Imbalance in normal sleep/wake cycle will improve Outcome: Progressing   Problem: Coping: Goal: Coping ability will improve Outcome: Progressing Goal: Will verbalize feelings Outcome: Progressing   Problem: Health Behavior/Discharge Planning: Goal: Ability to make decisions will improve Outcome: Progressing Goal: Compliance with therapeutic regimen will improve Outcome: Progressing    Problem: Role Relationship: Goal: Will demonstrate positive changes in social behaviors and relationships Outcome: Progressing   Problem: Safety: Goal: Ability to disclose and discuss suicidal ideas will improve Outcome: Progressing Goal: Ability to identify and utilize support systems that promote safety will improve Outcome: Progressing   Problem: Self-Concept: Goal: Will verbalize positive feelings about self Outcome: Progressing Goal: Level of anxiety will decrease Outcome: Progressing   Problem: Education: Goal: Ability to make informed decisions regarding treatment will improve Outcome: Progressing   Problem: Coping: Goal: Coping ability will improve Outcome: Progressing   Problem: Health Behavior/Discharge Planning: Goal: Identification of resources available to assist in meeting health care needs will improve Outcome: Progressing   Problem: Medication: Goal: Compliance with prescribed medication regimen will improve Outcome: Progressing   Problem: Self-Concept: Goal: Ability to disclose and discuss suicidal ideas will improve Outcome: Progressing Goal: Will verbalize positive feelings about self Outcome: Progressing

## 2024-01-03 NOTE — Group Note (Signed)
 Date:  01/03/2024 Time:  8:57 PM  Group Topic/Focus:  Stages of Change:   The focus of this group is to explain the stages of change and help patients identify changes they want to make upon discharge. Wrap-Up Group:   The focus of this group is to help patients review their daily goal of treatment and discuss progress on daily workbooks.    Participation Level:  Minimal  Participation Quality:  Attentive and Resistant  Affect:  Defensive, Flat, and Resistant  Cognitive:  Alert and Oriented  Insight: Lacking  Engagement in Group:  Limited and Resistant  Modes of Intervention:  Activity, Discussion, and Socialization  Additional Comments:     Maglione,Jimy Gates E 01/03/2024, 8:57 PM

## 2024-01-03 NOTE — [facility] Counselor (Signed)
 Patient has been accepted at [facility], Elora is the contact person.  Address: [address], [place] [address]  Unclear on transition date, waiting on guardian and group home to work on financial details.  Sherryle Margo, MSW, LCSW 01/03/2024 4:16 PM

## 2024-01-03 NOTE — [facility] Counselor (Addendum)
 ADDENDUM [facility] reports that the patient has been ACCEPTED.  CSW provided contact information for the patient's guardian so financial details can be worked out.  CSW attempted to call the patient guardian who text that she will call this CSW later.  At time of this note, CSW has not received a call back.   Sherryle Margo, MSW, LCSW 01/03/2024 4:09 PM   ADDENDUM [facility] returned call to this CSW.  She wanted additional information on the patient's behaviors and any concerns with the patient having cerebral palsy.  She reports that that she will call this CSW back tomorrow, 01/04/24 with a decision.   Sherryle Margo, MSW, LCSW 01/03/2024 10:26 AM   CSW attempted to contact [place], [phone], with [facility] to follow up on referral.  CSW left HIPAA compliant voicemail.   CSW followed up with [facility], [phone].  She reports that she will call this CSW back once patient is further reviewed.   Sherryle Margo, MSW, LCSW 01/03/2024 10:13 AM

## 2024-01-03 NOTE — Progress Notes (Signed)
   01/03/24 1105  Psychosocial Assessment  Patient Complaints None  Eye Contact Brief  Facial Expression Flat  Affect Anxious  Speech Logical/coherent  Interaction Guarded  Motor Activity Slow  Appearance/Hygiene In scrubs  Behavior Characteristics Cooperative  Mood Pleasant  Thought Process  Coherency WDL  Content WDL  Delusions None reported or observed  Perception WDL  Hallucination None reported or observed  Judgment Limited  Confusion None  Danger to Self  Current suicidal ideation? Denies  Self-Injurious Behavior No self-injurious ideation or behavior indicators observed or expressed   Agreement Not to Harm Self Yes  Description of Agreement verbal  Danger to Others  Danger to Others None reported or observed

## 2024-01-04 NOTE — Group Note (Signed)
 Date:  01/04/2024 Time:  10:37 AM  Group Topic/Focus:  Building Self Esteem:   The Focus of this group is helping patients become aware of the effects of self-esteem on their lives, the things they and others do that enhance or undermine their self-esteem, seeing the relationship between their level of self-esteem and the choices they make and learning ways to enhance self-esteem.    Participation Level:  Active  Participation Quality:  Appropriate  Affect:  Appropriate  Cognitive:  Appropriate  Insight: Appropriate  Engagement in Group:  Engaged  Modes of Intervention:  Activity  Additional Comments:    Derrick Atkins 01/04/2024, 10:37 AM

## 2024-01-04 NOTE — Group Note (Signed)
 [facility] LCSW Group Therapy Note   Group Date: 01/04/2024 Start Time: 1300 End Time: 1400   Type of Therapy/Topic:  Group Therapy:  Emotion Regulation  Participation Level:  Minimal   Mood:  Description of Group:    The purpose of this group is to assist patients in learning to regulate negative emotions and experience positive emotions. Patients will be guided to discuss ways in which they have been vulnerable to their negative emotions. These vulnerabilities will be juxtaposed with experiences of positive emotions or situations, and patients challenged to use positive emotions to combat negative ones. Special emphasis will be placed on coping with negative emotions in conflict situations, and patients will process healthy conflict resolution skills.  Therapeutic Goals: Patient will identify two positive emotions or experiences to reflect on in order to balance out negative emotions:  Patient will label two or more emotions that they find the most difficult to experience:  Patient will be able to demonstrate positive conflict resolution skills through discussion or role plays:   Summary of Patient Progress:   The patient minimally participated in group, minimally sharing personal experiences related to managing complex triggers. Together with peers and the group facilitator, the patient engaged in brainstorming coping strategies addressing these triggers at both micro and macro levels. The group collaboratively explored various life domains--such as family, identity, career, religion, and social supports--identifying whether these areas placed them in the blue, green, yellow, or red emotional zones. For domains identified in the yellow or red zones, the group held in-depth discussions to recognize specific triggers and explore strategies for healthier coping and improved well-being.    Therapeutic Modalities:   Cognitive Behavioral Therapy Feelings Identification Dialectical Behavioral  Therapy   Derrick CHRISTELLA Kerns, LCSW

## 2024-01-04 NOTE — Progress Notes (Signed)
 Patient reported anxious about potentially leaving soon. He was able to talk through his feelings with this RN and then participate in a game with peers after. Patient denied SI/HI/AH/VH to this RN. PRN anxiety medication given today and patient compliant with all other medication.

## 2024-01-04 NOTE — Plan of Care (Signed)
   Problem: Education: Goal: Knowledge of [facility] Education information/materials will improve Outcome: Progressing Goal: Emotional status will improve Outcome: Progressing Goal: Mental status will improve Outcome: Progressing Goal: Verbalization of understanding the information provided will improve Outcome: Progressing

## 2024-01-04 NOTE — Progress Notes (Signed)
 [facility] MD Progress Note  01/04/2024 4:30 PM HILLARY SCHWEGLER  MRN:  [id number]   SHREYAN HINZ is a 53yo male with a past psychiatric history of schizophrenia, major depressive disorder, adjustment disorder who presented to the emergency department via law enforcement voluntarily with concerns of worsening depression, hallucinations and thoughts to self-harm.   Per chart review patient voluntarily reported to the hospital with report of chronic pain and difficulty making friends at [facility] as well as experiencing auditory hallucinations that put him down.  He reported hearing these voices daily and has been ongoing for the last 2 years since his mother passed away he also endorsed visions and inability to read people's minds  Subjective:  Chart reviewed, case discussed in multidisciplinary meeting, patient seen during rounds.  Patient engaged for rounds.   01/04/24: Patient noted on the unit multiple times today engaged appropriately. In interview he is pleased to have secured placement option. SW confirms patient has been accepted to residential site. He is appropriate for discharge given safe disposition. He denies SI/HI, denies AVH paranoia and delusions. He has tolerated medications without side effects and there has been no decompensation following discontinuation of scheduled hydroxyzine  and Lamictal    01/03/24: Patient seen today denies any adverse effects to discontinuation of Lamictal  or Hydroxyzine . No changes in assessment. DSS continues to search for housing. Denies suicidal homicidal ideations. Denies hallucinations, paranoia, and delusions. He is appearing disheveled and depressed with flat affect. However, brightens when engages with peers. There have been no inappropriate or aggressive behaviors noted in this setting. Medications have been titrated to effective dose to address psychosis.    01/02/24: Patient seen for follow-up today.  Reviewed events of stay where he is waiting  for placement. He reports mood, sleep and appetite are stable and denies AVH, SI/HI reporting feels safe in this setting. We discussed that DSS is working to help him find housing.  Reviewed taking scheduled lamictal  at 25mg  BID and hydroxyzine  10mg  daily. He is unaware of when medication was started or for what intent. Discuss plan to discontinue these to avoid polypharmacy as indication and necessity is unclear, he agrees and verbalizes understanding. There have been no inappropriate or aggressive behaviors noted in this setting. He is not appearing psychotic.      Sleep: Good  Appetite:  Fair  Past Psychiatric History: see h&P Family History:  Family History  Problem Relation Age of Onset   Heart disease Father    Heart attack Father    Social History:  Social History   Substance and Sexual Activity  Alcohol  Use No     Social History   Substance and Sexual Activity  Drug Use No    Social History   Socioeconomic History   Marital status: Single    Spouse name: Not on file   Number of children: Not on file   Years of education: Not on file   Highest education level: Not on file  Occupational History   Not on file  Tobacco Use   Smoking status: Never    Passive exposure: Never   Smokeless tobacco: Never  Vaping Use   Vaping status: Never Used  Substance and Sexual Activity   Alcohol  use: No   Drug use: No   Sexual activity: Not Currently  Other Topics Concern   Not on file  Social History Narrative   Not on file   Social Drivers of Health   Financial Resource Strain: Not on file  Food Insecurity:  No Food Insecurity (12/18/2023)   Hunger Vital Sign    Worried About Running Out of Food in the Last Year: Never true    Ran Out of Food in the Last Year: Never true  Transportation Needs: Unmet Transportation Needs (12/18/2023)   PRAPARE - Administrator, Civil Service (Medical): Yes    Lack of Transportation (Non-Medical): Yes  Physical Activity: Not on  file  Stress: Not on file  Social Connections: Not on file   Past Medical History:  Past Medical History:  Diagnosis Date   Cerebral palsy (HCC)    Depression    Diabetes mellitus without complication (HCC)    Hypertension    Kidney stones    Schizoaffective disorder (HCC)    Scoliosis     Past Surgical History:  Procedure Laterality Date   BOWEL RESECTION     CATARACT EXTRACTION W/PHACO Right 09/15/2022   Procedure: CATARACT EXTRACTION PHACO AND INTRAOCULAR LENS PLACEMENT (IOC) RIGHT DIABETIC VISION BLUE HEALON 5  15.79  01:31.9;  Surgeon: Mittie Gaskin, MD;  Location: [facility];  Service: Ophthalmology;  Laterality: Right;  Latex Diabetic   INCISION AND DRAINAGE ABSCESS Left 12/20/2022   Procedure: INCISION AND DRAINAGE ABSCESS;  Surgeon: Lane Shope, MD;  Location: [facility];  Service: General;  Laterality: Left;   LAPAROTOMY N/A 05/15/2020   Procedure: EXPLORATORY LAPAROTOMY;  Surgeon: Desiderio Schanz, MD;  Location: [facility];  Service: General;  Laterality: N/A;    Current Medications: Current Facility-Administered Medications  Medication Dose Route Frequency Provider Last Rate Last Admin   acetaminophen  (TYLENOL ) tablet 1,000 mg  1,000 mg Oral Q6H PRN Millington, Matthew E, PA-C   1,000 mg at 12/31/23 0043   albuterol  (VENTOLIN  HFA) 108 (90 Base) MCG/ACT inhaler 2 puff  2 puff Inhalation Q4H PRN Mills, Shnese E, NP       alum & mag hydroxide-simeth (MAALOX/MYLANTA) 200-200-20 MG/5ML suspension 30 mL  30 mL Oral Q4H PRN Mills, Shnese E, NP       ascorbic acid  (VITAMIN C ) tablet 500 mg  500 mg Oral Daily Mills, Shnese E, NP   500 mg at 01/04/24 0813   aspirin  EC tablet 81 mg  81 mg Oral Daily Mills, Shnese E, NP   81 mg at 01/04/24 9187   benztropine  (COGENTIN ) tablet 1 mg  1 mg Oral Daily Mills, Shnese E, NP   1 mg at 01/04/24 0813   busPIRone  (BUSPAR ) tablet 15 mg  15 mg Oral TID Mills, Shnese E, NP   15 mg at 01/04/24 1604   cholecalciferol  (VITAMIN D3) 25  MCG (1000 UNIT) tablet 1,000 Units  1,000 Units Oral Daily Mills, Shnese E, NP   1,000 Units at 01/04/24 0813   cyanocobalamin  (VITAMIN B12) tablet 1,000 mcg  1,000 mcg Oral Daily Mills, Shnese E, NP   1,000 mcg at 01/04/24 0814   feeding supplement (ENSURE PLUS HIGH PROTEIN) liquid 237 mL  237 mL Oral TID BM Jadapalle, Sree, MD   237 mL at 01/04/24 1205   fenofibrate  tablet 54 mg  54 mg Oral Daily Mills, Shnese E, NP   54 mg at 01/04/24 9185   FLUoxetine  (PROZAC ) capsule 20 mg  20 mg Oral Daily Mills, Shnese E, NP   20 mg at 01/04/24 9187   folic acid  (FOLVITE ) tablet 1 mg  1 mg Oral Daily Mills, Shnese E, NP   1 mg at 01/04/24 9187   hydrOXYzine  (ATARAX ) tablet 25 mg  25 mg Oral TID PRN  Moishe Burton E, NP   25 mg at 01/04/24 1451   ibuprofen  (ADVIL ) tablet 400 mg  400 mg Oral Q6H PRN Debbie Donnice BRAVO, PA-C   400 mg at 01/04/24 1451   iron  polysaccharides (NIFEREX) capsule 150 mg  150 mg Oral Daily Mills, Shnese E, NP   150 mg at 01/04/24 9185   levothyroxine  (SYNTHROID ) tablet 88 mcg  88 mcg Oral Q0600 Moishe Burton BRAVO, NP   88 mcg at 01/04/24 0605   linaclotide  (LINZESS ) capsule 290 mcg  290 mcg Oral Daily Mills, Shnese E, NP   290 mcg at 01/04/24 0813   magnesium  hydroxide (MILK OF MAGNESIA) suspension 30 mL  30 mL Oral Daily PRN Mills, Shnese E, NP       metoprolol  tartrate (LOPRESSOR ) tablet 25 mg  25 mg Oral BID Mills, Shnese E, NP   25 mg at 01/04/24 1604   multivitamin with minerals tablet 1 tablet  1 tablet Oral Daily Jadapalle, Sree, MD   1 tablet at 01/04/24 9187   OLANZapine  (ZYPREXA ) injection 10 mg  10 mg Intramuscular TID PRN Mills, Shnese E, NP       OLANZapine  (ZYPREXA ) injection 5 mg  5 mg Intramuscular TID PRN Mills, Shnese E, NP       OLANZapine  (ZYPREXA ) tablet 15 mg  15 mg Oral QHS Cleotilde Hoy HERO, NP   15 mg at 01/03/24 2049   OLANZapine  zydis (ZYPREXA ) disintegrating tablet 5 mg  5 mg Oral TID PRN Mills, Shnese E, NP   5 mg at 12/31/23 1243   pantoprazole  (PROTONIX )  EC tablet 40 mg  40 mg Oral QAC breakfast Mills, Shnese E, NP   40 mg at 01/04/24 0813   polyethylene glycol (MIRALAX  / GLYCOLAX ) packet 17 g  17 g Oral Daily Mills, Shnese E, NP   17 g at 01/04/24 9187   potassium chloride  SA (KLOR-CON  M) CR tablet 20 mEq  20 mEq Oral Daily Mills, Shnese E, NP   20 mEq at 01/04/24 9187   rosuvastatin  (CRESTOR ) tablet 5 mg  5 mg Oral Daily Mills, Shnese E, NP   5 mg at 01/04/24 0814   senna (SENOKOT) tablet 17.2 mg  2 tablet Oral QHS Moishe Burton E, NP   17.2 mg at 01/03/24 2048    Lab Results:  No results found for this or any previous visit (from the past 48 hours).   Blood Alcohol  level:  Lab Results  Component Value Date   [facility] <15 12/17/2023   ETH <15 11/03/2023    Metabolic Disorder Labs: Lab Results  Component Value Date   HGBA1C 5.1 04/15/2023   MPG 99.67 04/15/2023   MPG 88 12/13/2022   No results found for: PROLACTIN Lab Results  Component Value Date   CHOL 117 12/22/2023   TRIG 45 12/22/2023   HDL 62 12/22/2023   CHOLHDL 1.9 12/22/2023   VLDL 9 12/22/2023   LDLCALC 46 12/22/2023   LDLCALC 43 03/25/2022     Psychiatric Specialty Exam:  Presentation  General Appearance:  Casual  Eye Contact: Fair  Speech: Clear and Coherent  Speech Volume: Decreased    Mood and Affect  Mood:-improved today Euthymic  Affect: Congruent   Thought Process  Thought Processes: Coherent  Descriptions of Associations:Intact  Orientation:Full (Time, Place and Person)  Thought Content:WDL  Hallucinations: denies  Ideas of Reference:denies Suicidal Thoughts:denies Homicidal Thoughts:denies  Sensorium  Memory: Immediate Fair; Recent Fair  Judgment: -- (at baseline)  Insight: Shallow   Executive  Functions  Concentration:fair Attention Span:fair Recall:fair Fund of Knowledge:fair Language:fair  Psychomotor Activity  Psychomotor Activity:NL Musculoskeletal: Strength & Muscle Tone: within normal  limits Gait & Station: normal Assets  Assets:resilience   Physical Exam: Physical Exam Vitals and nursing note reviewed.  HENT:     Head: Atraumatic.   Eyes:     Extraocular Movements: Extraocular movements intact.   Pulmonary:     Effort: Pulmonary effort is normal.   Neurological:     Mental Status: He is alert.    Review of Systems  Psychiatric/Behavioral:  Negative for depression, hallucinations, substance abuse and suicidal ideas. The patient does not have insomnia.    Blood pressure 136/63, pulse 90, temperature (!) 97.5 F (36.4 C), resp. rate 14, height 6' 4 (1.93 m), weight 80.7 kg, SpO2 98%. Body mass index is 21.67 kg/m.  Diagnosis: Principal Problem:   Schizophrenia (HCC)   PLAN: Safety and Monitoring:  -- Voluntary admission to inpatient psychiatric unit for safety, stabilization and treatment  -- Daily contact with patient to assess and evaluate symptoms and progress in treatment  -- Patient's case to be discussed in multi-disciplinary team meeting  -- Observation Level : q15 minute checks  -- Vital signs:  q12 hours  -- Precautions: suicide, elopement, and assault -- Encouraged patient to participate in unit milieu and in scheduled group therapies  2. Psychiatric Diagnoses and Treatment:  Is doing well today. Working with DSS to find placement. DSS to meet with pt again 6/23  Continue Zyprexa  15 mg at bedtime Continue Prozac  20 mg daily Continue Buspar  15 mg TID DC lamictal  25mg  po daily DC hydroxyzine  10mg  po daily The risks/benefits/side-effects/alternatives to this medication were discussed in detail with the patient and time was given for questions. The patient consents to medication trial. -- Metabolic profile and EKG monitoring obtained while on an atypical antipsychotic  -- Encouraged patient to participate in unit milieu and in scheduled group therapies         3. Medical Issues Being Addressed: Nutrition consult initiated related to  weight loss     4. Discharge Planning:     -- Social work and case management to assist with discharge planning and identification of hospital follow-up needs prior to discharge  -- needs appropriate and safe discharge disposition.   Hoy CHRISTELLA Pinal, NP 01/04/2024, 4:30 PM

## 2024-01-04 NOTE — BH IP Treatment Plan (Signed)
 Interdisciplinary Treatment and Diagnostic Plan Update  01/04/2024 Time of Session: Derrick Atkins MRN: [id number]  Principal Diagnosis: Schizophrenia [facility])  Secondary Diagnoses: Principal Problem:   Schizophrenia (HCC)   Current Medications:  Current Facility-Administered Medications  Medication Dose Route Frequency Provider Last Rate Last Admin   acetaminophen  (TYLENOL ) tablet 1,000 mg  1,000 mg Oral Q6H PRN Millington, Matthew E, PA-C   1,000 mg at 12/31/23 0043   albuterol  (VENTOLIN  HFA) 108 (90 Base) MCG/ACT inhaler 2 puff  2 puff Inhalation Q4H PRN Mills, Shnese E, NP       alum & mag hydroxide-simeth (MAALOX/MYLANTA) 200-200-20 MG/5ML suspension 30 mL  30 mL Oral Q4H PRN Mills, Shnese E, NP       ascorbic acid  (VITAMIN C ) tablet 500 mg  500 mg Oral Daily Mills, Shnese E, NP   500 mg at 01/04/24 0813   aspirin  EC tablet 81 mg  81 mg Oral Daily Mills, Shnese E, NP   81 mg at 01/04/24 9187   benztropine  (COGENTIN ) tablet 1 mg  1 mg Oral Daily Mills, Shnese E, NP   1 mg at 01/04/24 0813   busPIRone  (BUSPAR ) tablet 15 mg  15 mg Oral TID Mills, Shnese E, NP   15 mg at 01/04/24 9187   cholecalciferol  (VITAMIN D3) 25 MCG (1000 UNIT) tablet 1,000 Units  1,000 Units Oral Daily Mills, Shnese E, NP   1,000 Units at 01/04/24 0813   cyanocobalamin  (VITAMIN B12) tablet 1,000 mcg  1,000 mcg Oral Daily Mills, Shnese E, NP   1,000 mcg at 01/04/24 0814   feeding supplement (ENSURE PLUS HIGH PROTEIN) liquid 237 mL  237 mL Oral TID BM Jadapalle, Sree, MD   237 mL at 01/04/24 0817   fenofibrate  tablet 54 mg  54 mg Oral Daily Mills, Shnese E, NP   54 mg at 01/04/24 9185   FLUoxetine  (PROZAC ) capsule 20 mg  20 mg Oral Daily Mills, Shnese E, NP   20 mg at 01/04/24 9187   folic acid  (FOLVITE ) tablet 1 mg  1 mg Oral Daily Mills, Shnese E, NP   1 mg at 01/04/24 9187   hydrOXYzine  (ATARAX ) tablet 25 mg  25 mg Oral TID PRN Mills, Shnese E, NP   25 mg at 12/31/23 0044   ibuprofen  (ADVIL ) tablet 400 mg  400  mg Oral Q6H PRN Millington, Matthew E, PA-C   400 mg at 01/02/24 1216   iron  polysaccharides (NIFEREX) capsule 150 mg  150 mg Oral Daily Mills, Shnese E, NP   150 mg at 01/04/24 9185   levothyroxine  (SYNTHROID ) tablet 88 mcg  88 mcg Oral Q0600 Moishe Bernadette BRAVO, NP   88 mcg at 01/04/24 9394   linaclotide  (LINZESS ) capsule 290 mcg  290 mcg Oral Daily Mills, Shnese E, NP   290 mcg at 01/04/24 0813   magnesium  hydroxide (MILK OF MAGNESIA) suspension 30 mL  30 mL Oral Daily PRN Mills, Shnese E, NP       metoprolol  tartrate (LOPRESSOR ) tablet 25 mg  25 mg Oral BID Mills, Shnese E, NP   25 mg at 01/04/24 0813   multivitamin with minerals tablet 1 tablet  1 tablet Oral Daily Jadapalle, Sree, MD   1 tablet at 01/04/24 9187   OLANZapine  (ZYPREXA ) injection 10 mg  10 mg Intramuscular TID PRN Mills, Shnese E, NP       OLANZapine  (ZYPREXA ) injection 5 mg  5 mg Intramuscular TID PRN Mills, Shnese E, NP  OLANZapine  (ZYPREXA ) tablet 15 mg  15 mg Oral QHS Cleotilde Hoy HERO, NP   15 mg at 01/03/24 2049   OLANZapine  zydis (ZYPREXA ) disintegrating tablet 5 mg  5 mg Oral TID PRN Mills, Shnese E, NP   5 mg at 12/31/23 1243   pantoprazole  (PROTONIX ) EC tablet 40 mg  40 mg Oral QAC breakfast Mills, Shnese E, NP   40 mg at 01/04/24 0813   polyethylene glycol (MIRALAX  / GLYCOLAX ) packet 17 g  17 g Oral Daily Mills, Shnese E, NP   17 g at 01/04/24 9187   potassium chloride  SA (KLOR-CON  M) CR tablet 20 mEq  20 mEq Oral Daily Moishe Burton E, NP   20 mEq at 01/04/24 9187   rosuvastatin  (CRESTOR ) tablet 5 mg  5 mg Oral Daily Mills, Shnese E, NP   5 mg at 01/04/24 0814   senna (SENOKOT) tablet 17.2 mg  2 tablet Oral QHS Mills, Shnese E, NP   17.2 mg at 01/03/24 2048   PTA Medications: Medications Prior to Admission  Medication Sig Dispense Refill Last Dose/Taking   acetaminophen  (TYLENOL ) 500 MG tablet Take 1,000 mg by mouth 3 (three) times daily as needed for mild pain or moderate pain.   Taking As Needed   ascorbic  acid (VITAMIN C ) 500 MG tablet Take 1 tablet (500 mg total) by mouth daily. 30 tablet 3 Past Week   aspirin  EC 81 MG tablet Take 1 tablet (81 mg total) by mouth daily. Swallow whole. 30 tablet 12 Past Week   benztropine  (COGENTIN ) 1 MG tablet Take 1 tablet (1 mg total) by mouth daily. 30 tablet 1 Past Week   busPIRone  (BUSPAR ) 15 MG tablet Take 15 mg by mouth 2 (two) times daily.   Past Week   cholecalciferol  (VITAMIN D3) 25 MCG (1000 UNIT) tablet Take 1,000 Units by mouth daily.   Past Week   Cyanocobalamin  (B-12) 1000 MCG CAPS Take 1 capsule by mouth daily.   Past Week   fenofibrate  54 MG tablet Take 54 mg by mouth daily.   Past Week   FLUoxetine  (PROZAC ) 20 MG capsule Take 20 mg by mouth daily.   Past Week   gemfibrozil  (LOPID ) 600 MG tablet Take 600 mg by mouth 2 (two) times daily before a meal.   Past Week   haloperidol  (HALDOL ) 5 MG tablet Take 5 mg by mouth 2 (two) times daily.   Past Week   hydrOXYzine  (ATARAX ) 10 MG tablet Take 10 mg by mouth daily. At noon   Past Week   iron  polysaccharides (NIFEREX) 150 MG capsule Take 1 capsule (150 mg total) by mouth daily. 30 capsule 3 Past Week   lamoTRIgine  (LAMICTAL ) 25 MG tablet Take 25 mg by mouth 2 (two) times daily.   Past Week   levothyroxine  (SYNTHROID ) 88 MCG tablet Take 1 tablet (88 mcg total) by mouth daily at 6 (six) AM. 30 tablet 1 Past Week   LINZESS  290 MCG CAPS capsule Take 290 mcg by mouth daily.   Past Week   loperamide  (IMODIUM ) 2 MG capsule Take 4 mg by mouth 2 (two) times daily.   Taking   OLANZapine  (ZYPREXA ) 7.5 MG tablet Take 3.75 mg by mouth at bedtime.   Past Week   pantoprazole  (PROTONIX ) 40 MG tablet Take 1 tablet (40 mg total) by mouth daily before breakfast. 30 tablet 0 Past Week   polyethylene glycol (MIRALAX  / GLYCOLAX ) 17 g packet Take 17 g by mouth daily.   Past Week  potassium chloride  SA (KLOR-CON  M) 20 MEQ tablet Take 20 mEq by mouth daily.   Past Week   rosuvastatin  (CRESTOR ) 5 MG tablet Take 5 mg by mouth  daily.   Past Week   senna (SENOKOT) 8.6 MG TABS tablet Take 2 tablets by mouth at bedtime.   Past Week   VENTOLIN  HFA 108 (90 Base) MCG/ACT inhaler Inhale into the lungs every 4 (four) hours as needed.   Taking As Needed   ABILIFY  MAINTENA 400 MG PRSY prefilled syringe Inject 400 mg into the muscle every 28 (twenty-eight) days.      folic acid  (FOLVITE ) 1 MG tablet Take 1 tablet (1 mg total) by mouth daily. (Patient not taking: Reported on 12/19/2023) 30 tablet 3 Not Taking   metoprolol  tartrate (LOPRESSOR ) 25 MG tablet Take 1 tablet (25 mg total) by mouth 2 (two) times daily. (Patient not taking: Reported on 12/19/2023) 60 tablet 2 Not Taking   OLANZapine  (ZYPREXA ) 10 MG tablet Take 10 mg by mouth at bedtime. (Take with 20mg  tablet to equal 30mg  total) (Patient not taking: Reported on 11/03/2023)      OLANZapine  (ZYPREXA ) 20 MG tablet Take 1 tablet (20 mg total) by mouth at bedtime. (Patient not taking: Reported on 12/18/2023) 30 tablet 1    OLANZapine  (ZYPREXA ) 5 MG tablet Add to your existing 20 mg dose of olanzapine  for nightly dose of 25 mg total (Patient not taking: Reported on 11/03/2023) 30 tablet 2     Patient Stressors:    Patient Strengths:    Treatment Modalities: Medication Management, Group therapy, Case management,  1 to 1 session with clinician, Psychoeducation, Recreational therapy.   Physician Treatment Plan for Primary Diagnosis: Schizophrenia (HCC) Long Term Goal(s): Improvement in symptoms so as ready for discharge   Short Term Goals: Ability to identify changes in lifestyle to reduce recurrence of condition will improve  Medication Management: Evaluate patient's response, side effects, and tolerance of medication regimen.  Therapeutic Interventions: 1 to 1 sessions, Unit Group sessions and Medication administration.  Evaluation of Outcomes: Progressing  Physician Treatment Plan for Secondary Diagnosis: Principal Problem:   Schizophrenia (HCC)  Long Term Goal(s):  Improvement in symptoms so as ready for discharge   Short Term Goals: Ability to identify changes in lifestyle to reduce recurrence of condition will improve     Medication Management: Evaluate patient's response, side effects, and tolerance of medication regimen.  Therapeutic Interventions: 1 to 1 sessions, Unit Group sessions and Medication administration.  Evaluation of Outcomes: Progressing   RN Treatment Plan for Primary Diagnosis: Schizophrenia (HCC) Long Term Goal(s): Knowledge of disease and therapeutic regimen to maintain health will improve  Short Term Goals: Ability to demonstrate self-control, Ability to participate in decision making will improve, Ability to verbalize feelings will improve, Ability to disclose and discuss suicidal ideas, Ability to identify and develop effective coping behaviors will improve, and Compliance with prescribed medications will improve  Medication Management: RN will administer medications as ordered by provider, will assess and evaluate patient's response and provide education to patient for prescribed medication. RN will report any adverse and/or side effects to prescribing provider.  Therapeutic Interventions: 1 on 1 counseling sessions, Psychoeducation, Medication administration, Evaluate responses to treatment, Monitor vital signs and CBGs as ordered, Perform/monitor CIWA, COWS, AIMS and Fall Risk screenings as ordered, Perform wound care treatments as ordered.  Evaluation of Outcomes: Progressing   LCSW Treatment Plan for Primary Diagnosis: Schizophrenia (HCC) Long Term Goal(s): Safe transition to appropriate next level of care  at discharge, Engage patient in therapeutic group addressing interpersonal concerns.  Short Term Goals: Engage patient in aftercare planning with referrals and resources, Increase social support, Increase ability to appropriately verbalize feelings, Increase emotional regulation, Facilitate acceptance of mental health  diagnosis and concerns, and Increase skills for wellness and recovery  Therapeutic Interventions: Assess for all discharge needs, 1 to 1 time with Social worker, Explore available resources and support systems, Assess for adequacy in community support network, Educate family and significant other(s) on suicide prevention, Complete Psychosocial Assessment, Interpersonal group therapy.  Evaluation of Outcomes: Progressing   Progress in Treatment: Attending groups: Yes. Participating in groups: Yes. Taking medication as prescribed: Yes. Toleration medication: Yes. Family/Significant other contact made: Yes, individual(s) contacted:  SPE completed with the patient's guardian. Patient understands diagnosis: Yes. Discussing patient identified problems/goals with staff: Yes. Medical problems stabilized or resolved: Yes. Denies suicidal/homicidal ideation: Yes. Issues/concerns per patient self-inventory: No. Other: none  New problem(s) identified: No, Describe:  none  Update 12/24/2023: No changes at this time. Update 12/29/23: No changes at this time.  Update 01/04/2024: No changes at this time.    New Short Term/Long Term Goal(s): detox, elimination of symptoms of psychosis, medication management for mood stabilization; elimination of SI thoughts; development of comprehensive mental wellness/sobriety plan.  Update 12/24/2023: No changes at this time. Update 12/29/23: No changes at this time.   Update 01/04/2024: No changes at this time.    Patient Goals:  get a good nights rest  Update 12/24/2023: No changes at this time. Update 12/29/23: No changes at this time.   Update 01/04/2024: No changes at this time.    Discharge Plan or Barriers: CSW to assist in the development of appropriate discharge plans.  Patient reports a desire to find a new placement.  CSW to reach out to the patient's guardian.    Update 12/24/2023: CSW and DSS are looking into placement and possible group homes for the Pt. Update  12/29/23: Pt has upcoming meeting with DSS this evening.  Update 01/04/2024: Patient has been ACCEPTED to [facility].  Guardian to work out the financial details with the provider.    Reason for Continuation of Hospitalization: Anxiety Depression Medication stabilization Suicidal ideation   Estimated Length of Stay:  1-7 days Update 12/24/2023: TBD Update 12/29/23: No changes at this time.   Update 01/04/2024: TBD  Last 3 [place] Suicide Severity Risk Score: Flowsheet Row Admission (Current) from 12/18/2023 in [facility]ICINE ED from 12/17/2023 in [facility] Emergency Department at [facility] ED from 11/30/2023 in [facility] Emergency Department at [facility]  C-SSRS RISK CATEGORY Low Risk  [Simultaneous filing. User may not have seen previous data.] High Risk No Risk    Last PHQ 2/9 Scores:     No data to display          Scribe for Treatment Team: CHEYENNE SCHUMM, LCSW 01/04/2024 8:40 AM

## 2024-01-04 NOTE — Group Note (Signed)
 Date:  01/04/2024 Time:  12:55 PM  Group Topic/Focus:  Dimensions of Wellness:   The focus of this group is to introduce the topic of wellness and discuss the role each dimension of wellness plays in total health.    Participation Level:  Active  Participation Quality:  Appropriate  Affect:  Appropriate  Cognitive:  Appropriate  Insight: Appropriate  Engagement in Group:  Engaged  Modes of Intervention:  Activity  Additional Comments:    Derrick Atkins Derrick Atkins 01/04/2024, 12:55 PM

## 2024-01-05 MED ORDER — LEVOTHYROXINE SODIUM 88 MCG PO TABS: 88.0000 ug | ORAL_TABLET | Freq: Every day | ORAL | 0 refills | Status: AC

## 2024-01-05 MED ORDER — METOPROLOL TARTRATE 25 MG PO TABS: 25.0000 mg | ORAL_TABLET | Freq: Two times a day (BID) | ORAL | 2 refills | Status: DC

## 2024-01-05 MED ORDER — ROSUVASTATIN CALCIUM 5 MG PO TABS: 5.0000 mg | ORAL_TABLET | Freq: Every day | ORAL | 0 refills | Status: AC

## 2024-01-05 MED ORDER — ALBUTEROL SULFATE HFA 108 (90 BASE) MCG/ACT IN AERS: 2.0000 | INHALATION_SPRAY | RESPIRATORY_TRACT | 0 refills | Status: AC | PRN

## 2024-01-05 MED ORDER — PANTOPRAZOLE SODIUM 40 MG PO TBEC: 40.0000 mg | DELAYED_RELEASE_TABLET | Freq: Every day | ORAL | 0 refills | Status: AC

## 2024-01-05 MED ORDER — ASPIRIN 81 MG PO TBEC: 81.0000 mg | DELAYED_RELEASE_TABLET | Freq: Every day | ORAL | 12 refills | Status: AC

## 2024-01-05 MED ORDER — OLANZAPINE 15 MG PO TABS: 15.0000 mg | ORAL_TABLET | Freq: Every day | ORAL | 0 refills | Status: AC

## 2024-01-05 MED ORDER — FLUOXETINE HCL 20 MG PO CAPS: 20.0000 mg | ORAL_CAPSULE | Freq: Every day | ORAL | 0 refills | Status: AC

## 2024-01-05 MED ORDER — FOLIC ACID 1 MG PO TABS: 1.0000 mg | ORAL_TABLET | Freq: Every day | ORAL | 3 refills | Status: DC

## 2024-01-05 MED ORDER — BENZTROPINE MESYLATE 1 MG PO TABS: 1.0000 mg | ORAL_TABLET | Freq: Every day | ORAL | 0 refills | Status: AC

## 2024-01-05 MED ORDER — VITAMIN B-12 1000 MCG PO TABS: 1000.0000 ug | ORAL_TABLET | Freq: Every day | ORAL | 0 refills | Status: AC

## 2024-01-05 MED ORDER — POTASSIUM CHLORIDE CRYS ER 20 MEQ PO TBCR: 20.0000 meq | EXTENDED_RELEASE_TABLET | Freq: Every day | ORAL | 0 refills | Status: AC

## 2024-01-05 MED ORDER — BUSPIRONE HCL 15 MG PO TABS: 15.0000 mg | ORAL_TABLET | Freq: Three times a day (TID) | ORAL | 0 refills | Status: DC

## 2024-01-05 MED ORDER — OLANZAPINE 15 MG PO TABS: 15.0000 mg | ORAL_TABLET | Freq: Every day | ORAL | 0 refills | Status: DC

## 2024-01-05 MED ORDER — BUSPIRONE HCL 15 MG PO TABS: 15.0000 mg | ORAL_TABLET | Freq: Three times a day (TID) | ORAL | 0 refills | Status: AC

## 2024-01-05 MED ORDER — VITAMIN D 25 MCG (1000 UNIT) PO TABS: 1000.0000 [IU] | ORAL_TABLET | Freq: Every day | ORAL | 0 refills | Status: AC

## 2024-01-05 MED ORDER — LINZESS 290 MCG PO CAPS: 290.0000 ug | ORAL_CAPSULE | Freq: Every day | ORAL | 0 refills | Status: AC

## 2024-01-05 MED ORDER — FOLIC ACID 1 MG PO TABS: 1.0000 mg | ORAL_TABLET | Freq: Every day | ORAL | 0 refills | Status: DC

## 2024-01-05 MED ORDER — FENOFIBRATE 54 MG PO TABS: 54.0000 mg | ORAL_TABLET | Freq: Every day | ORAL | 0 refills | Status: AC

## 2024-01-05 MED ORDER — ASCORBIC ACID 500 MG PO TABS: 500.0000 mg | ORAL_TABLET | Freq: Every day | ORAL | 3 refills | Status: AC

## 2024-01-05 MED ORDER — TRAZODONE HCL 50 MG PO TABS
50.0000 mg | ORAL_TABLET | Freq: Every evening | ORAL | Status: DC | PRN
  Administered 2024-01-05: 50 mg via ORAL

## 2024-01-05 MED ORDER — METOPROLOL TARTRATE 25 MG PO TABS: 25.0000 mg | ORAL_TABLET | Freq: Two times a day (BID) | ORAL | 0 refills | Status: AC

## 2024-01-05 MED ORDER — POLYETHYLENE GLYCOL 3350 17 G PO PACK: 17.0000 g | PACK | Freq: Every day | ORAL | 0 refills | Status: AC

## 2024-01-05 MED ORDER — B-12 1000 MCG PO CAPS: 1.0000 | ORAL_CAPSULE | Freq: Every day | ORAL | 0 refills | Status: DC

## 2024-01-05 MED ORDER — POLYSACCHARIDE IRON COMPLEX 150 MG PO CAPS: 150.0000 mg | ORAL_CAPSULE | Freq: Every day | ORAL | 0 refills | Status: AC

## 2024-01-05 NOTE — Progress Notes (Signed)
 Pt in dayroom at the start of shift listening to YouTube music which he and a peer randomly selected music to play.  Derrick Atkins got angry and screamed at a peer for changing the music because he wanted to only play heavy metal music but was not allowed as others wanted a different genre of music.  Derrick Atkins later left the dayroom and went into his room where he quietly sat on the bed.  He appeared preoccupied in thoughts on engagement.  He refused to process with staff.  He took all HS medications.    01/04/24 2200  Psych Admission Type (Psych Patients Only)  Admission Status Voluntary  Psychosocial Assessment  Patient Complaints None  Eye Contact Brief  Facial Expression Flat  Affect Angry;Preoccupied  Speech Logical/coherent  Interaction Guarded  Motor Activity Slow  Appearance/Hygiene In scrubs  Behavior Characteristics Cooperative  Mood Angry  Aggressive Behavior  Effect No apparent injury  Thought Process  Coherency WDL  Content WDL  Delusions None reported or observed  Perception WDL  Hallucination None reported or observed  Judgment Impaired  Confusion WDL  Danger to Self  Current suicidal ideation? Denies  Agreement Not to Harm Self Yes  Description of Agreement Verbal  Danger to Others  Danger to Others None reported or observed    Problem: Education: Goal: Knowledge of [place] [facility] Education information/materials will improve Outcome: Progressing Goal: Emotional status will improve Outcome: Progressing Goal: Mental status will improve Outcome: Progressing Goal: Verbalization of understanding the information provided will improve Outcome: Progressing   Problem: Activity: Goal: Interest or engagement in activities will improve Outcome: Progressing Goal: Sleeping patterns will improve Outcome: Progressing   Problem: Coping: Goal: Ability to verbalize frustrations and anger appropriately will improve Outcome: Progressing Goal: Ability to demonstrate  self-control will improve Outcome: Progressing

## 2024-01-05 NOTE — [facility] Suicide Risk Assessment (Signed)
 [facility] Discharge Suicide Risk Assessment   Principal Problem: Schizophrenia [facility]) Discharge Diagnoses: Principal Problem:   Schizophrenia (HCC)   Total Time spent with patient: 30 minutes  Musculoskeletal: Strength & Muscle Tone: within normal limits Gait & Station: normal   Psychiatric Specialty Exam  Presentation  General Appearance:  Casual  Eye Contact: Fair  Speech: Clear and Coherent  Speech Volume: Decreased    Mood and Affect  Mood: Euthymic  Duration of Depression Symptoms: Greater than two weeks  Affect: Congruent   Thought Process  Thought Processes: Coherent  Descriptions of Associations:Intact  Orientation:Full (Time, Place and Person)  Thought Content:WDL  History of Schizophrenia/Schizoaffective disorder:No  Duration of Psychotic Symptoms:N/A  Hallucinations:none Ideas of Reference:None  Suicidal Thoughts:denies  Homicidal Thoughts:denies  Sensorium  Memory: Immediate Fair; Recent Fair  Judgment: -- (at baseline)  Insight: Shallow   Executive Functions  Concentration: Fair  Attention Span: Fair  Recall: [employer] of Knowledge:good Language:wnl  Psychomotor Activity  Psychomotor Activity:No data recorded  Assets  Assets: Manufacturing systems engineer; Desire for Improvement   Sleep  Sleep:No data recorded Estimated Sleeping Duration (Last 24 Hours): 4.50-5.50 hours  Physical Exam: Physical Exam ROS Blood pressure 128/75, pulse 87, temperature (!) 97.5 F (36.4 C), resp. rate 16, height 6' 4 (1.93 m), weight 80.7 kg, SpO2 100%. Body mass index is 21.67 kg/m.  Mental Status Per Nursing Assessment::   On Admission:  Suicidal ideation indicated by patient  Demographic Factors:  Male and Low socioeconomic status  Loss Factors: Decrease in vocational status, Decline in physical health, and Financial problems/change in socioeconomic status  Historical Factors: History of aggression and psychosis  Risk  Reduction Factors:   Positive social support, Positive therapeutic relationship, and Positive coping skills or problem solving skills  Continued Clinical Symptoms:  Previous Psychiatric Diagnoses and Treatments  Cognitive Features That Contribute To Risk:  None    Suicide Risk:  Minimal: No identifiable suicidal ideation.  Patients presenting with no risk factors but with morbid ruminations; may be classified as minimal risk based on the severity of the depressive symptoms   Follow-up Information     [facility] Follow up.   Contact information: [address] [place], [place] [address]  [phone]                Hoy CHRISTELLA [place], NP 01/05/2024, 9:44 AM

## 2024-01-05 NOTE — Discharge Summary (Signed)
 Physician Discharge Summary Note  Patient:  Derrick Atkins is an 53 y.o., male MRN:  [id number] DOB:  November 01, 1970 Patient phone:  [phone] (home)  Patient address:   [address] [facility] [place] [address],    Date of Admission:  12/18/2023 Date of Discharge: 01/05/24  Reason for Admission:   Derrick Atkins is a 53yo male with a past psychiatric history of schizophrenia, major depressive disorder, adjustment disorder who presented to the emergency department via law enforcement voluntarily with concerns of worsening depression, hallucinations and thoughts to self-harm.   Per chart review patient voluntarily reported to the hospital with report of chronic pain and difficulty making friends at [facility] as well as experiencing auditory hallucinations that put him down.  He reported hearing these voices daily and has been ongoing for the last 2 years since his mother passed away he also endorsed visions and inability to read people's minds    Principal Problem: Schizophrenia [facility]) Discharge Diagnoses: Principal Problem:   Schizophrenia [facility])   Past Psychiatric History:  Patient reports this is the second admission to [facility] for behavioral health, reports many past longtime inpatient admissions including [facility], reports history of at support, reports most prevalent diagnoses schizoaffective disorder, he is unable to provide current medication list, he reports past suicidal attempts including most recent I tried to suffocate myself with a pillow , he has history of psychosis endorsing current symptoms of hallucinations Previous Med Trials: Abilify  Maintena, Cogentin , Buspar , Prozac , Haldol , Hydroxyzine , Lamictal , Olanzapine     Social History:  Social History   Substance and Sexual Activity  Alcohol  Use No     Social History   Substance and Sexual Activity  Drug Use No    Social History   Socioeconomic History    Marital status: Single    Spouse name: Not on file   Number of children: Not on file   Years of education: Not on file   Highest education level: Not on file  Occupational History   Not on file  Tobacco Use   Smoking status: Never    Passive exposure: Never   Smokeless tobacco: Never  Vaping Use   Vaping status: Never Used  Substance and Sexual Activity   Alcohol  use: No   Drug use: No   Sexual activity: Not Currently  Other Topics Concern   Not on file  Social History Narrative   Not on file   Social Drivers of Health   Financial Resource Strain: Not on file  Food Insecurity: No Food Insecurity (12/18/2023)   Hunger Vital Sign    Worried About Running Out of Food in the Last Year: Never true    Ran Out of Food in the Last Year: Never true  Transportation Needs: Unmet Transportation Needs (12/18/2023)   PRAPARE - Administrator, Civil Service (Medical): Yes    Lack of Transportation (Non-Medical): Yes  Physical Activity: Not on file  Stress: Not on file  Social Connections: Not on file   Past Medical History:  Past Medical History:  Diagnosis Date   Cerebral palsy (HCC)    Depression    Diabetes mellitus without complication (HCC)    Hypertension    Kidney stones    Schizoaffective disorder (HCC)    Scoliosis     Past Surgical History:  Procedure Laterality Date   BOWEL RESECTION     CATARACT EXTRACTION W/PHACO Right 09/15/2022   Procedure: CATARACT EXTRACTION PHACO  AND INTRAOCULAR LENS PLACEMENT (IOC) RIGHT DIABETIC VISION BLUE HEALON 5  15.79  01:31.9;  Surgeon: Mittie Gaskin, MD;  Location: [facility];  Service: Ophthalmology;  Laterality: Right;  Latex Diabetic   INCISION AND DRAINAGE ABSCESS Left 12/20/2022   Procedure: INCISION AND DRAINAGE ABSCESS;  Surgeon: Lane Shope, MD;  Location: [facility] ORS;  Service: General;  Laterality: Left;   LAPAROTOMY N/A 05/15/2020   Procedure: EXPLORATORY LAPAROTOMY;  Surgeon: Desiderio Schanz, MD;   Location: [facility] ORS;  Service: General;  Laterality: N/A;   Family History:  Family History  Problem Relation Age of Onset   Heart disease Father    Heart attack Father     Hospital Course:   53 year old male with a past psychiatric history of schizophrenia, major depressive disorder, and adjustment disorder presented to the emergency department via law enforcement voluntarily, due to worsening depression, auditory hallucinations, and thoughts of self-harm.  Course of Stay:Per chart review, the patient voluntarily presented to the hospital reporting chronic pain, difficulty forming friendships at [facility], and experiencing persistent auditory hallucinations, which he described as derogatory. He reported that these hallucinations have been ongoing for two years, beginning after the loss of his mother. He also endorsed visual disturbances and difficulty reading others' thoughts.  Upon admission, Haldol  5 mg BID was discontinued in favor of monotherapy with Zyprexa . Patient was started and maintained on the following regimen: Zyprexa  15 mg at bedtime, Prozac  20 mg daily, and Buspirone  15 mg TID. The patient demonstrated tolerance to all medications without reported or observed side effects. EKG and metabolic profile monitoring were completed while on atypical antipsychotic therapy.  Patient was encouraged to participate in unit milieu and group therapy sessions. He demonstrated clinical improvement and was calm, organized, and cooperative throughout the remainder of admission. DSS, worked closely with pt and social work team to secure safe discharge disposition.   Nutrition consult was initiated due to concern for weight loss, and follow-up recommendations were incorporated into discharge planning.  Diagnosis at Discharge:  Schizophrenia  Major Depressive Disorder, Recurrent  Adjustment Disorder with Depressed Mood  Medications at Discharge:  Zyprexa  15 mg PO QHS - for  psychosis  Prozac  20 mg PO daily - for depression  Buspirone  15 mg PO TID - for anxiety  The risks, benefits, side effects, and alternatives were discussed in detail with the patient. Time was given for questions, and the patient consented to continue treatment.  Risk Assessment at Discharge:Detailed risk assessment is complete.  Acute suicide risk: Low  Acute violence risk: Low  No behavioral concerns or safety issues during admission  Protective Factors:  Medication compliant  Voluntary engagement in treatment  Support from DSS and case management  Insight into illness  Risk Factors:  History of chronic psychosis  Ongoing hallucinations (improved)  Social isolation and housing instability  Past suicidal ideation  Discharge Plan and Education:Currently, all modifiable risk of harm to self/harm to others have been addressed through medication management, supportive therapy, and coordination with social services. Ongoing collaboration with DSS and continued psychiatric follow-up is recommended.  Medication education was provided to include risks, benefits, and side effects. Patient verbalized understanding and agrees to plan of care.      Psychiatric Specialty Exam:  Presentation  General Appearance:  Casual  Eye Contact: Fair  Speech: Clear and Coherent  Speech Volume: Decreased    Mood and Affect  Mood: Euthymic  Affect: Congruent   Thought Process  Thought Processes: Coherent  Descriptions of Associations:Intact  Orientation:Full (Time, Place and Person)  Thought Content:WDL  Hallucinations:No data recorded Ideas of Reference:None  Suicidal Thoughts:No data recorded Homicidal Thoughts:No data recorded  Sensorium  Memory: Immediate Fair; Recent Fair  Judgment: -- (at baseline)  Insight: Shallow   Executive Functions  Concentration: Fair  Attention Span: Fair  Recall: [employer] of Knowledge:No data  recorded Language:No data recorded  Psychomotor Activity  Psychomotor Activity:No data recorded Musculoskeletal: Strength & Muscle Tone: within normal limits Gait & Station: normal Assets  Assets: Manufacturing systems engineer; Desire for Improvement   Sleep  Sleep:No data recorded   Physical Exam: Physical Exam ROS Blood pressure 128/75, pulse 87, temperature (!) 97.5 F (36.4 C), resp. rate 16, height 6' 4 (1.93 m), weight 80.7 kg, SpO2 100%. Body mass index is 21.67 kg/m.   Social History   Tobacco Use  Smoking Status Never   Passive exposure: Never  Smokeless Tobacco Never   Tobacco Cessation:  N/A, patient does not currently use tobacco products   Blood Alcohol  level:  Lab Results  Component Value Date   [facility] <15 12/17/2023   ETH <15 11/03/2023    Metabolic Disorder Labs:  Lab Results  Component Value Date   HGBA1C 5.1 04/15/2023   MPG 99.67 04/15/2023   MPG 88 12/13/2022   No results found for: PROLACTIN Lab Results  Component Value Date   CHOL 117 12/22/2023   TRIG 45 12/22/2023   HDL 62 12/22/2023   CHOLHDL 1.9 12/22/2023   VLDL 9 12/22/2023   LDLCALC 46 12/22/2023   LDLCALC 43 03/25/2022    See Psychiatric Specialty Exam and Suicide Risk Assessment completed by Attending Physician prior to discharge.  Discharge destination:  Other:  residential  Is patient on multiple antipsychotic therapies at discharge:  no  Do you recommend tapering to monotherapy for antipsychotics?  No   Has Patient had three or more failed trials of antipsychotic monotherapy by history:  No   Discharge Instructions     Diet - low sodium heart healthy   Complete by: As directed    Increase activity slowly   Complete by: As directed       Allergies as of 01/05/2024       Reactions   Valproic Acid  And Related Other (See Comments)   Hyperammonemic encephalopathy   Phenytoin Sodium Extended Other (See Comments), Nausea And Vomiting   Prednisone Hives, Nausea And  Vomiting   Latex Hives, Nausea And Vomiting, Rash        Medication List     STOP taking these medications    Abilify  Maintena 400 MG Prsy prefilled syringe Generic drug: ARIPiprazole  ER   acetaminophen  500 MG tablet Commonly known as: TYLENOL    gemfibrozil  600 MG tablet Commonly known as: LOPID    haloperidol  5 MG tablet Commonly known as: HALDOL    hydrOXYzine  10 MG tablet Commonly known as: ATARAX    lamoTRIgine  25 MG tablet Commonly known as: LAMICTAL    loperamide  2 MG capsule Commonly known as: IMODIUM        TAKE these medications      Indication  ascorbic acid  500 MG tablet Commonly known as: VITAMIN C  Take 1 tablet (500 mg total) by mouth daily.  Indication: Inadequate Vitamin C    aspirin  EC 81 MG tablet Take 1 tablet (81 mg total) by mouth daily. Swallow whole. What changed: Another medication with the same name was added. Make sure you understand how and when to take each.  Indication: Disease involving a Thrombosis or an Embolism  aspirin  EC 81 MG tablet Take 1 tablet (81 mg total) by mouth daily. Swallow whole. Start taking on: January 06, 2024 What changed: You were already taking a medication with the same name, and this prescription was added. Make sure you understand how and when to take each.  Indication: Disease involving a Thrombosis or an Embolism   B-12 1000 MCG Caps Take 1 capsule by mouth daily. What changed: Another medication with the same name was added. Make sure you understand how and when to take each.  Indication: Inadequate Vitamin B12   cyanocobalamin  1000 MCG tablet Commonly known as: VITAMIN B12 Take 1 tablet (1,000 mcg total) by mouth daily. Start taking on: January 06, 2024 What changed: You were already taking a medication with the same name, and this prescription was added. Make sure you understand how and when to take each.  Indication: Inadequate Vitamin B12   benztropine  1 MG tablet Commonly known as: COGENTIN  Take 1  tablet (1 mg total) by mouth daily.  Indication: Extrapyramidal Reaction caused by Medications   busPIRone  15 MG tablet Commonly known as: BUSPAR  Take 1 tablet (15 mg total) by mouth 3 (three) times daily. What changed: when to take this  Indication: Anxiety Disorder   cholecalciferol  25 MCG (1000 UNIT) tablet Commonly known as: VITAMIN D3 Take 1 tablet (1,000 Units total) by mouth daily.  Indication: Vitamin D  Deficiency   fenofibrate  54 MG tablet Take 1 tablet (54 mg total) by mouth daily.  Indication: Primary Hypercholesterolemia   FLUoxetine  20 MG capsule Commonly known as: PROZAC  Take 1 capsule (20 mg total) by mouth daily.  Indication: Depression   folic acid  1 MG tablet Commonly known as: FOLVITE  Take 1 tablet (1 mg total) by mouth daily.  Indication: Anemia From Inadequate Folic Acid    iron  polysaccharides 150 MG capsule Commonly known as: NIFEREX Take 1 capsule (150 mg total) by mouth daily.  Indication: Anemia From Inadequate Iron  in the Body   levothyroxine  88 MCG tablet Commonly known as: SYNTHROID  Take 1 tablet (88 mcg total) by mouth daily at 6 (six) AM.  Indication: Underactive Thyroid    Linzess  290 MCG Caps capsule Generic drug: linaclotide  Take 1 capsule (290 mcg total) by mouth daily.  Indication: Chronic Constipation of Unknown Cause   metoprolol  tartrate 25 MG tablet Commonly known as: LOPRESSOR  Take 1 tablet (25 mg total) by mouth 2 (two) times daily. What changed: Another medication with the same name was added. Make sure you understand how and when to take each.  Indication: High Blood Pressure   metoprolol  tartrate 25 MG tablet Commonly known as: LOPRESSOR  Take 1 tablet (25 mg total) by mouth 2 (two) times daily. What changed: You were already taking a medication with the same name, and this prescription was added. Make sure you understand how and when to take each.  Indication: High Blood Pressure   OLANZapine  15 MG tablet Commonly  known as: ZYPREXA  Take 1 tablet (15 mg total) by mouth at bedtime. What changed:  medication strength how much to take Another medication with the same name was removed. Continue taking this medication, and follow the directions you see here.  Indication: Schizophrenia   pantoprazole  40 MG tablet Commonly known as: PROTONIX  Take 1 tablet (40 mg total) by mouth daily before breakfast.  Indication: Gastroesophageal Reflux Disease   polyethylene glycol 17 g packet Commonly known as: MIRALAX  / GLYCOLAX  Take 17 g by mouth daily. What changed: Another medication with the same name was added. Make  sure you understand how and when to take each.  Indication: Constipation   polyethylene glycol 17 g packet Commonly known as: MIRALAX  / GLYCOLAX  Take 17 g by mouth daily. Start taking on: January 06, 2024 What changed: You were already taking a medication with the same name, and this prescription was added. Make sure you understand how and when to take each.  Indication: Constipation   potassium chloride  SA 20 MEQ tablet Commonly known as: KLOR-CON  M Take 1 tablet (20 mEq total) by mouth daily.  Indication: Low Amount of Potassium in the Blood   rosuvastatin  5 MG tablet Commonly known as: CRESTOR  Take 1 tablet (5 mg total) by mouth daily.  Indication: High Amount of Fats in the Blood   senna 8.6 MG Tabs tablet Commonly known as: SENOKOT Take 2 tablets by mouth at bedtime.  Indication: Constipation   Ventolin  HFA 108 (90 Base) MCG/ACT inhaler Generic drug: albuterol  Inhale into the lungs every 4 (four) hours as needed. What changed: Another medication with the same name was added. Make sure you understand how and when to take each.  Indication: Asthma   albuterol  108 (90 Base) MCG/ACT inhaler Commonly known as: Ventolin  HFA Inhale 2 puffs into the lungs every 4 (four) hours as needed for shortness of breath or wheezing. What changed: You were already taking a medication with the same  name, and this prescription was added. Make sure you understand how and when to take each.  Indication: Asthma        Follow-up Information     [facility] Follow up.   Why: Group Home will follow up with scheduling. Contact information: [address] [place], [place] [address]  [phone]                   Signed: Hoy CHRISTELLA Pinal, NP 01/05/2024, 3:35 PM

## 2024-01-05 NOTE — Group Note (Signed)
 Date:  01/05/2024 Time:  12:24 AM  Group Topic/Focus:  Overcoming Stress:   The focus of this group is to define stress and help patients assess their triggers.    Participation Level:  Active  Participation Quality:  Appropriate and Attentive  Affect:  Appropriate  Cognitive:  Alert and Appropriate  Insight: Appropriate and Good  Engagement in Group:  Limited  Modes of Intervention:  Clarification, Discussion, Rapport Building, and Support  Additional Comments:     Gaylord Seydel 01/05/2024, 12:24 AM

## 2024-01-05 NOTE — Progress Notes (Signed)
 Patient left unit with belongings and discharge packet. Discharge instructions reviewed with patient and all questions answered. Denied SI/HI/AVH. Patient walked to [facility] entrance and introduced to group home staff. Patient discharged to group home with his belongings.

## 2024-01-05 NOTE — Progress Notes (Signed)
  [facility] Adult Case Management Discharge Plan :  Will you be returning to the same living situation after discharge:  No.Patient going to new group home. At discharge, do you have transportation home?: Yes,  [facility]. Do you have the ability to pay for your medications: Yes,  MEDICARE / MEDICARE PART A AND B  Release of information consent forms completed and in the chart;  Patient's signature needed at discharge.  Patient to Follow up at:  Follow-up Information     [facility] Follow up.   Why: Group Home will follow up with scheduling. Contact information: [address] [place], [place] [address]  [phone]                Next level of care provider has access to [facility] Link:no  Safety Planning and Suicide Prevention discussed: Yes,  SPE completed with the patient's guardian.      Has patient been referred to the Quitline?: Patient does not use tobacco/nicotine products  Patient has been referred for addiction treatment: No known substance use disorder.  TUNG PUSTEJOVSKY, LCSW 01/05/2024, 12:11 PM

## 2024-01-05 NOTE — Plan of Care (Signed)
 ?  Problem: Education: ?Goal: Knowledge of [place] [facility] Education information/materials will improve ?Outcome: Adequate for Discharge ?Goal: Emotional status will improve ?Outcome: Adequate for Discharge ?Goal: Mental status will improve ?Outcome: Adequate for Discharge ?Goal: Verbalization of understanding the information provided will improve ?Outcome: Adequate for Discharge ?  ?Problem: Activity: ?Goal: Interest or engagement in activities will improve ?Outcome: Adequate for Discharge ?Goal: Sleeping patterns will improve ?Outcome: Adequate for Discharge ?  ?Problem: Coping: ?Goal: Ability to verbalize frustrations and anger appropriately will improve ?Outcome: Adequate for Discharge ?Goal: Ability to demonstrate self-control will improve ?Outcome: Adequate for Discharge ?  ?Problem: Health Behavior/Discharge Planning: ?Goal: Identification of resources available to assist in meeting health care needs will improve ?Outcome: Adequate for Discharge ?Goal: Compliance with treatment plan for underlying cause of condition will improve ?Outcome: Adequate for Discharge ?  ?Problem: Physical Regulation: ?Goal: Ability to maintain clinical measurements within normal limits will improve ?Outcome: Adequate for Discharge ?  ?Problem: Safety: ?Goal: Periods of time without injury will increase ?Outcome: Adequate for Discharge ?  ?Problem: Education: ?Goal: Utilization of techniques to improve thought processes will improve ?Outcome: Adequate for Discharge ?Goal: Knowledge of the prescribed therapeutic regimen will improve ?Outcome: Adequate for Discharge ?  ?Problem: Activity: ?Goal: Interest or engagement in leisure activities will improve ?Outcome: Adequate for Discharge ?Goal: Imbalance in normal sleep/wake cycle will improve ?Outcome: Adequate for Discharge ?  ?Problem: Coping: ?Goal: Coping ability will improve ?Outcome: Adequate for Discharge ?Goal: Will verbalize feelings ?Outcome: Adequate for Discharge ?   ?Problem: Health Behavior/Discharge Planning: ?Goal: Ability to make decisions will improve ?Outcome: Adequate for Discharge ?Goal: Compliance with therapeutic regimen will improve ?Outcome: Adequate for Discharge ?  ?Problem: Role Relationship: ?Goal: Will demonstrate positive changes in social behaviors and relationships ?Outcome: Adequate for Discharge ?  ?Problem: Safety: ?Goal: Ability to disclose and discuss suicidal ideas will improve ?Outcome: Adequate for Discharge ?Goal: Ability to identify and utilize support systems that promote safety will improve ?Outcome: Adequate for Discharge ?  ?Problem: Self-Concept: ?Goal: Will verbalize positive feelings about self ?Outcome: Adequate for Discharge ?Goal: Level of anxiety will decrease ?Outcome: Adequate for Discharge ?  ?Problem: Education: ?Goal: Ability to make informed decisions regarding treatment will improve ?Outcome: Adequate for Discharge ?  ?Problem: Coping: ?Goal: Coping ability will improve ?Outcome: Adequate for Discharge ?  ?Problem: Health Behavior/Discharge Planning: ?Goal: Identification of resources available to assist in meeting health care needs will improve ?Outcome: Adequate for Discharge ?  ?Problem: Medication: ?Goal: Compliance with prescribed medication regimen will improve ?Outcome: Adequate for Discharge ?  ?Problem: Self-Concept: ?Goal: Ability to disclose and discuss suicidal ideas will improve ?Outcome: Adequate for Discharge ?Goal: Will verbalize positive feelings about self ?Outcome: Adequate for Discharge ?  ?

## 2024-01-05 NOTE — Care Management Important Message (Signed)
 Important Message  Patient Details  Name: Derrick Atkins MRN: [id number] Date of Birth: Aug 18, 1970   Important Message Given:  Yes - Medicare IM     DIMITRIOS BALESTRIERI, LCSW 01/05/2024, 11:30 AM

## 2024-01-05 NOTE — BHH Counselor (Signed)
 CSW spoke with the patient's guardian.  Guardian reports that the patient's group home can take the patient today.  She reports that [facility] SW Janija Kollore will provide transportation.  She requests that medications be called into [facility] in [place].    She report patient is going to all male facility, however, she does not want the patient to know that at this time, she wants to have the discussion with him.  CSW spoke with Rosana with the group home who confirmed that patient can come to the home.   He requests that appointment be set up with [facility], guardian requested that CSW contact Rosana for his preferred provider.   Name of group home is [facility].   Sherryle Margo, MSW, LCSW 01/05/2024 9:39 AM

## 2024-01-09 ENCOUNTER — Emergency Department
Admission: EM | Admit: 2024-01-09 | Discharge: 2024-01-10 | Disposition: A | Discharge status: Skilled Nursing Facility | Attending: Emergency Medicine | Admitting: Emergency Medicine

## 2024-01-09 DIAGNOSIS — F32A Depression, unspecified: Secondary | ICD-10-CM

## 2024-01-09 DIAGNOSIS — M549 Dorsalgia, unspecified: Secondary | ICD-10-CM | POA: Insufficient documentation

## 2024-01-09 DIAGNOSIS — F209 Schizophrenia, unspecified: Secondary | ICD-10-CM | POA: Diagnosis not present

## 2024-01-09 LAB — COMPREHENSIVE METABOLIC PANEL WITH GFR
ALT: 23 U/L (ref 0–44)
AST: 28 U/L (ref 15–41)
Albumin: 3.8 g/dL (ref 3.5–5.0)
Alkaline Phosphatase: 99 U/L (ref 38–126)
Anion gap: 9 (ref 5–15)
BUN: 12 mg/dL (ref 6–20)
CO2: 22 mmol/L (ref 22–32)
Calcium: 8.9 mg/dL (ref 8.9–10.3)
Chloride: 107 mmol/L (ref 98–111)
Creatinine, Ser: 0.81 mg/dL (ref 0.61–1.24)
GFR, Estimated: >60 mL/min (ref >60)
Glucose, Bld: 285 mg/dL — ABNORMAL HIGH (ref 70–99)
Potassium: 4 mmol/L (ref 3.5–5.1)
Sodium: 138 mmol/L (ref 135–145)
Total Bilirubin: 0.9 mg/dL (ref 0.0–1.2)
Total Protein: 6.1 g/dL — ABNORMAL LOW (ref 6.5–8.1)

## 2024-01-09 LAB — CBC
HCT: 34.7 % — ABNORMAL LOW (ref 39.0–52.0)
Hemoglobin: 11.4 g/dL — ABNORMAL LOW (ref 13.0–17.0)
MCH: 28.3 pg (ref 26.0–34.0)
MCHC: 32.9 g/dL (ref 30.0–36.0)
MCV: 86.1 fL (ref 80.0–100.0)
Platelets: 159 10*3/uL (ref 150–400)
RBC: 4.03 MIL/uL — ABNORMAL LOW (ref 4.22–5.81)
RDW: 13.6 % (ref 11.5–15.5)
WBC: 7.3 10*3/uL (ref 4.0–10.5)
nRBC: 0 % (ref 0.0–0.2)

## 2024-01-09 LAB — ETHANOL: Alcohol, Ethyl (B): <15 mg/dL (ref <15)

## 2024-01-09 MED ORDER — IBUPROFEN 600 MG PO TABS
600.0000 mg | ORAL_TABLET | Freq: Four times a day (QID) | ORAL | Status: DC | PRN
  Administered 2024-01-09: 600 mg via ORAL
  Filled 2024-01-09: qty 1

## 2024-01-09 NOTE — Discharge Instructions (Addendum)
You have been seen in the emergency department for a  psychiatric concern. You have been evaluated both medically as well as psychiatrically. Please follow-up with your outpatient resources provided. Return to the emergency department for any worsening symptoms, or any thoughts of hurting yourself or anyone else so that we may attempt to help you. 
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
## 2024-01-09 NOTE — ED Notes (Signed)
 RN called [facility] at this time and was made aware that pt is showing as  on leave and not currently staying at the residence. RN continues to search through pt notes for updated Group Home.

## 2024-01-09 NOTE — ED Notes (Signed)
 RN attempted to call [facility] 3 times to give report on pt and try to make aware of pts discharge status. No one available to answer phone.

## 2024-01-09 NOTE — ED Notes (Signed)
 After speaking with pt, pt and EMS unsure of name of Group Home where pt came from. Pt recently moved to new group home. Per pt, he is unhappy there because the nurse doesn't take care of me there, they should have never moved me.

## 2024-01-09 NOTE — ED Notes (Signed)
 RN able to get a response from [facility] to be made aware that the pt is not currently a resident there.

## 2024-01-09 NOTE — ED Provider Notes (Addendum)
 [facility] Provider Note    Event Date/Time   First MD Initiated Contact with Patient 01/09/24 2020     (approximate)  History   Chief Complaint: Back Pain  HPI  Derrick Atkins is a 53 y.o. male with a past medical history of cerebral palsy, depression, diabetes, schizophrenia, presents to the emergency department for evaluation.  When asked why the patient is here he just mumbles to himself I do not know.  Patient states he was just placed in a new group home 3 to 4 days ago.  Patient states he needs to get help and he has no friends or family.  When I inquired as to what we can help him with he continues to mumble to himself I do not know.  When asked if he would like to go back to the group home, he states I should just shoot myself.  When asked what we could do to help him he states I think I need to be in a hospital.  Patient is unable to qualify this any further.  Physical Exam   Triage Vital Signs: ED Triage Vitals  Encounter Vitals Group     BP 01/09/24 2007 (!) 160/72     Girls Systolic BP Percentile --      Girls Diastolic BP Percentile --      Boys Systolic BP Percentile --      Boys Diastolic BP Percentile --      Pulse Rate 01/09/24 2007 73     Resp 01/09/24 2007 18     Temp 01/09/24 2007 98.6 F (37 C)     Temp Source 01/09/24 2007 Oral     SpO2 01/09/24 2007 100 %     Weight --      Height --      Head Circumference --      Peak Flow --      Pain Score 01/09/24 2015 8     Pain Loc --      Pain Education --      Exclude from Growth Chart --     Most recent vital signs: Vitals:   01/09/24 2007  BP: (!) 160/72  Pulse: 73  Resp: 18  Temp: 98.6 F (37 C)  SpO2: 100%    General: Awake, no distress.  CV:  Good peripheral perfusion.  Regular rate and rhythm  Resp:  Normal effort.  Equal breath sounds bilaterally.  Abd:  No distention.  Soft  ED Results / Procedures / Treatments   MEDICATIONS ORDERED IN  ED: Medications - No data to display   IMPRESSION / MDM / ASSESSMENT AND PLAN / ED COURSE  I reviewed the triage vital signs and the nursing notes.  Patient's presentation is most consistent with acute presentation with potential threat to life or bodily function.  Patient presents to the emergency department for medical evaluation.  Patient is not entirely clear around what we can do to help him.  He is mumbling to himself at times.  Did make somewhat of a suicidal statement although then retracted it.  Mumbling to himself continues to repeat I do not know.  We will have psychiatry TTS evaluate.  Will check labs and continue to closely monitor.  Patient denies any medical complaints.  Contrary to the triage note patient denies any back pain any more than typical.  CBC reassuring, chemistry reassuring.  Ethanol negative.  Patient has been seen by psychiatry.  They believe the patient safe  for discharge home from psychiatric standpoint.  No concerning medical findings on workup.  Will discharge per psychiatric recommendation.  FINAL CLINICAL IMPRESSION(S) / ED DIAGNOSES   Psychiatric evaluation   Note:  This document was prepared using Dragon voice recognition software and may include unintentional dictation errors.   Dorothyann Drivers, MD 01/09/24 2030    Dorothyann Drivers, MD 01/09/24 2303

## 2024-01-09 NOTE — ED Notes (Signed)
 Pt walked to restroom, unable to provide urine sample at this time.

## 2024-01-09 NOTE — ED Triage Notes (Signed)
 Pt arriving from Group Home with [facility] for chronic back pain related to scoliosis and psych eval. Pt reports hearing voices in his head. Pt denies SI/HI from auditory hallucinations. Pt unable to describe what he is hearing. Pt ABCs intact. RR even and unlabored. Pt in NAD but looks uncomfortable.   Past Medical History:  Diagnosis Date   Cerebral palsy (HCC)    Depression    Diabetes mellitus without complication (HCC)    Hypertension    Kidney stones    Schizoaffective disorder (HCC)    Scoliosis

## 2024-01-09 NOTE — Consult Note (Signed)
 [facility] Psychiatric Consult Initial  Patient Name: [name]  MRN: [id number]  DOB: [birth year]  Consult Order details:  Orders (From admission, onward)     Start     Ordered   01/09/24 2026  CONSULT TO CALL ACT TEAM       Ordering Provider: Dorothyann Drivers, MD  Provider:  (Not yet assigned)  Question:  Reason for Consult?  Answer:  Psych consult   01/09/24 2025   01/09/24 2026  IP CONSULT TO PSYCHIATRY       Ordering Provider: Dorothyann Drivers, MD  Provider:  (Not yet assigned)  Question Answer Comment  Consult Timeframe ROUTINE - requires response within 24 hours   Reason for Consult? Consult for medication management   Contact phone number where the requesting provider can be reached [phone]      01/09/24 2025             Mode of Visit: Tele-visit Virtual Statement:TELE PSYCHIATRY ATTESTATION & CONSENT As the provider for this telehealth consult, I attest that I verified the patient's identity using two separate identifiers, introduced myself to the patient, provided my credentials, disclosed my location, and performed this encounter via a HIPAA-compliant, real-time, face-to-face, two-way, interactive audio and video platform and with the full consent and agreement of the patient (or guardian as applicable.) Patient physical location: [facility]. Telehealth provider physical location: home office in state of [place] .   Video start time:   Video end time:      Psychiatry Consult Evaluation  Service Date: January 09, 2024 LOS:  LOS: 0 days  Chief Complaint Auditory hallucinations frustrating  Primary Psychiatric Diagnoses  Schizophrenia  Assessment  Derrick Atkins is a 53 y.o. male admitted: Presented to the EDfor 01/09/2024  8:03 PM for evaluation of suicidal ideation. He carries the psychiatric diagnoses of Schizophrenia and Major Depressive Disorder and has a past medical history of cerebral palsy and Type 2 diabetes.  His current  presentation of chronic auditory hallucinations and interpersonal distress is most consistent with a stable psychiatric baseline complicated by psychosocial stress. He meets criteria for schizophrenia based on persistent hallucinations without acute safety risk or deterioration.  He was reportedly compliant with medications prior to admission as evidenced by recent 18-day inpatient psychiatric treatment. On initial examination, patient was alert, cooperative, frustrated, and fixated on a negative interaction with medical staff, but displayed no acute psychiatric or medical instability. Please see plan above for detailed recommendations.  Diagnoses:  Active Hospital problems: Active Problems:   * No active hospital problems. *    Plan   ## Psychiatric Medication Recommendations:  Continue current medication regimen  ## Medical Decision Making Capacity: Not specifically addressed in this encounter   ## Disposition:-- There are no psychiatric contraindications to discharge at this time  ## Behavioral / Environmental: - No specific recommendations at this time.     ## Safety and Observation Level:  - Based on my clinical evaluation, I estimate the patient to be at no risk of self harm in the current setting. - At this time, we recommend  routine. This decision is based on my review of the chart including patient's history and current presentation, interview of the patient, mental status examination, and consideration of suicide risk including evaluating suicidal ideation, plan, intent, suicidal or self-harm behaviors, risk factors, and protective factors. This judgment is based on our ability to directly address suicide risk, implement suicide prevention strategies, and develop a safety plan while the  patient is in the clinical setting. Please contact our team if there is a concern that risk level has changed.  CSSR Risk Category:C-SSRS RISK CATEGORY: Error: Question 6 not populated  Suicide  Risk Assessment: Patient has following modifiable risk factors for suicide: triggering events, which we are addressing by patient education. Patient has following non-modifiable or demographic risk factors for suicide: male gender Patient has the following protective factors against suicide: Access to outpatient mental health care  Thank you for this consult request. Recommendations have been communicated to the primary team.  We will psychiatrically clear the patient at this time.   Roniya Tetro, NP       History of Present Illness  Relevant Aspects of Hospital ED Course:  Admitted on 01/09/2024 for evaluation of suicidal ideation..  Patient Report:  Derrick Atkins is a 53 year old male with a psychiatric history of schizophrenia and depression, and a medical history significant for cerebral palsy and diabetes. He presented to the ED due to suicidal ideation. However, during evaluation, the patient clarified that although he does not like himself, he does not currently want to harm himself. He explicitly denies suicidal ideation (SI), homicidal ideation (HI), and visual hallucinations.  The patient reports experiencing ongoing auditory hallucinations, which he describes as frustrating, though he did not specify the content of the voices. He appeared preoccupied with a recent negative encounter with a nurse, stating the nurse refused to care for him and discriminated against him due to his physical disability. He recently completed an 18-day inpatient psychiatric admission. At present, the patient does not meet criteria for inpatient psychiatric hospitalization and is appropriate for psychiatric clearance and outpatient follow-up.  Psych ROS:  Depression: yes Anxiety:  no Mania (lifetime and current): no Psychosis: (lifetime and current):yes  Review of Systems  Constitutional: Negative.   HENT: Negative.    Eyes: Negative.   Respiratory: Negative.    Cardiovascular: Negative.    Gastrointestinal: Negative.   Genitourinary: Negative.   Musculoskeletal: Negative.   Skin: Negative.   Neurological: Negative.   Psychiatric/Behavioral:  Positive for hallucinations.      Psychiatric and Social History  Psychiatric History:  Information collected from patient and chart  Prev Dx/Sx: depression,Chronic Auditory hallucination Current Psych Provider: yes Home Meds (current): unknown Previous Med Trials: unknown Therapy: unknown  Prior Psych Hospitalization: yes  Prior Self Harm: unknown Prior Violence: unknown  Family Psych History: No pertinent family psychiatric history Family Hx suicide: No family history suicide  Social History:  Developmental Hx: unknown Educational Hx: unknown Occupational Hx: unknown Legal Hx: unknown Living Situation: unknown Spiritual Hx: unknown Access to weapons/lethal means: no   Substance History Alcohol : denies  Tobacco: denies Illicit drugs: denies Prescription drug abuse: denies Rehab hx: denies  Exam Findings   Vital Signs:  Temp:  [98.6 F (37 C)] 98.6 F (37 C) (06/30 2007) Pulse Rate:  [73] 73 (06/30 2007) Resp:  [18] 18 (06/30 2007) BP: (160)/(72) 160/72 (06/30 2007) SpO2:  [100 %] 100 % (06/30 2007) Blood pressure (!) 160/72, pulse 73, temperature 98.6 F (37 C), temperature source Oral, resp. rate 18, SpO2 100%. There is no height or weight on file to calculate BMI.  Physical Exam HENT:     Head: Normocephalic.     Nose: Nose normal.     Mouth/Throat:     Pharynx: Oropharynx is clear.  Pulmonary:     Effort: Pulmonary effort is normal.   Musculoskeletal:        General:  Normal range of motion.     Cervical back: Normal range of motion.   Skin:    General: Skin is dry.   Neurological:     Mental Status: He is alert.        Other History   These have been pulled in through the EMR, reviewed, and updated if appropriate.  Family History:  The patient's family history includes Heart  attack in his father; Heart disease in his father.  Medical History: Past Medical History:  Diagnosis Date   Cerebral palsy (HCC)    Depression    Diabetes mellitus without complication (HCC)    Hypertension    Kidney stones    Schizoaffective disorder (HCC)    Scoliosis     Surgical History: Past Surgical History:  Procedure Laterality Date   BOWEL RESECTION     CATARACT EXTRACTION W/PHACO Right 09/15/2022   Procedure: CATARACT EXTRACTION PHACO AND INTRAOCULAR LENS PLACEMENT (IOC) RIGHT DIABETIC VISION BLUE HEALON 5  15.79  01:31.9;  Surgeon: Mittie Gaskin, MD;  Location[facility];  Service: Ophthalmology;  Laterality: Right;  Latex Diabetic   INCISION AND DRAINAGE ABSCESS Left 12/20/2022   Procedure: INCISION AND DRAINAGE ABSCESS;  Surgeon: Lane Shope, MD;  Location: [facility];  Service: General;  Laterality: Left;   LAPAROTOMY N/A 05/15/2020   Procedure: EXPLORATORY LAPAROTOMY;  Surgeon: Desiderio Schanz, MD;  Location: [facility];  Service: General;  Laterality: N/A;     Medications:  No current facility-administered medications for this encounter.  Current Outpatient Medications:    albuterol  (VENTOLIN  HFA) 108 (90 Base) MCG/ACT inhaler, Inhale 2 puffs into the lungs every 4 (four) hours as needed for shortness of breath or wheezing., Disp: 17 each, Rfl: 0   ascorbic acid  (VITAMIN C ) 500 MG tablet, Take 1 tablet (500 mg total) by mouth daily., Disp: 30 tablet, Rfl: 3   aspirin  EC 81 MG tablet, Take 1 tablet (81 mg total) by mouth daily. Swallow whole., Disp: 30 tablet, Rfl: 12   aspirin  EC 81 MG tablet, Take 1 tablet (81 mg total) by mouth daily. Swallow whole., Disp: 30 tablet, Rfl: 12   benztropine  (COGENTIN ) 1 MG tablet, Take 1 tablet (1 mg total) by mouth daily., Disp: 30 tablet, Rfl: 0   busPIRone  (BUSPAR ) 15 MG tablet, Take 1 tablet (15 mg total) by mouth 3 (three) times daily., Disp: 90 tablet, Rfl: 0   cholecalciferol  (VITAMIN D3) 25 MCG (1000 UNIT)  tablet, Take 1 tablet (1,000 Units total) by mouth daily., Disp: 30 tablet, Rfl: 0   Cyanocobalamin  (B-12) 1000 MCG CAPS, Take 1 capsule by mouth daily., Disp: 30 capsule, Rfl: 0   cyanocobalamin  (VITAMIN B12) 1000 MCG tablet, Take 1 tablet (1,000 mcg total) by mouth daily., Disp: 30 tablet, Rfl: 0   fenofibrate  54 MG tablet, Take 1 tablet (54 mg total) by mouth daily., Disp: 30 tablet, Rfl: 0   FLUoxetine  (PROZAC ) 20 MG capsule, Take 1 capsule (20 mg total) by mouth daily., Disp: 30 capsule, Rfl: 0   folic acid  (FOLVITE ) 1 MG tablet, Take 1 tablet (1 mg total) by mouth daily., Disp: 30 tablet, Rfl: 0   iron  polysaccharides (NIFEREX) 150 MG capsule, Take 1 capsule (150 mg total) by mouth daily., Disp: 30 capsule, Rfl: 0   levothyroxine  (SYNTHROID ) 88 MCG tablet, Take 1 tablet (88 mcg total) by mouth daily at 6 (six) AM., Disp: 30 tablet, Rfl: 0   LINZESS  290 MCG CAPS capsule, Take 1 capsule (290 mcg total) by  mouth daily., Disp: 30 capsule, Rfl: 0   metoprolol  tartrate (LOPRESSOR ) 25 MG tablet, Take 1 tablet (25 mg total) by mouth 2 (two) times daily., Disp: 60 tablet, Rfl: 2   metoprolol  tartrate (LOPRESSOR ) 25 MG tablet, Take 1 tablet (25 mg total) by mouth 2 (two) times daily., Disp: 30 tablet, Rfl: 0   OLANZapine  (ZYPREXA ) 15 MG tablet, Take 1 tablet (15 mg total) by mouth at bedtime., Disp: 30 tablet, Rfl: 0   pantoprazole  (PROTONIX ) 40 MG tablet, Take 1 tablet (40 mg total) by mouth daily before breakfast., Disp: 30 tablet, Rfl: 0   polyethylene glycol (MIRALAX  / GLYCOLAX ) 17 g packet, Take 17 g by mouth daily., Disp: , Rfl:    polyethylene glycol (MIRALAX  / GLYCOLAX ) 17 g packet, Take 17 g by mouth daily., Disp: 30 packet, Rfl: 0   potassium chloride  SA (KLOR-CON  M) 20 MEQ tablet, Take 1 tablet (20 mEq total) by mouth daily., Disp: 30 tablet, Rfl: 0   rosuvastatin  (CRESTOR ) 5 MG tablet, Take 1 tablet (5 mg total) by mouth daily., Disp: 30 tablet, Rfl: 0   senna (SENOKOT) 8.6 MG TABS tablet,  Take 2 tablets by mouth at bedtime., Disp: , Rfl:    VENTOLIN  HFA 108 (90 Base) MCG/ACT inhaler, Inhale into the lungs every 4 (four) hours as needed., Disp: , Rfl:   Allergies: Allergies  Allergen Reactions   Valproic Acid  And Related Other (See Comments)    Hyperammonemic encephalopathy   Phenytoin Sodium Extended Other (See Comments) and Nausea And Vomiting   Prednisone Hives and Nausea And Vomiting   Latex Hives, Nausea And Vomiting and Rash    Ula Couvillon, NP

## 2024-01-09 NOTE — ED Notes (Signed)
 Pt legal guardian Candice was called at this time and made aware of pts visit to ED and clearance for discharge. Pt guardian not aware of new Group Home. To her knowledge, pt still at [facility].

## 2024-01-10 LAB — URINE DRUG SCREEN, QUALITATIVE (ARMC ONLY)
Amphetamines, Ur Screen: NOT DETECTED
Barbiturates, Ur Screen: NOT DETECTED
Benzodiazepine, Ur Scrn: NOT DETECTED
Cannabinoid 50 Ng, Ur ~~LOC~~: NOT DETECTED
Cocaine Metabolite,Ur ~~LOC~~: NOT DETECTED
MDMA (Ecstasy)Ur Screen: NOT DETECTED
Methadone Scn, Ur: NOT DETECTED
Opiate, Ur Screen: NOT DETECTED
Phencyclidine (PCP) Ur S: NOT DETECTED
Tricyclic, Ur Screen: NOT DETECTED

## 2024-01-10 NOTE — ED Notes (Signed)
 Pt requesting pain medicine for back pain. Provider made aware. Pt ABCs intact. RR even and unlabored. Pt in NAD. Bed in lowest locked position. Call bell in reach. Denies needs at this time.

## 2024-01-10 NOTE — ED Notes (Addendum)
 Writer asked to assist in locating and reaching pts current residence by assigned RN, AJ. Pt resides at group home [facility]. Writer called and spoke to [place] who was made aware of pts discharge and need for transportation. Per Rosana, he will be in route and will arrive around 0200 to pick pt up. Assigned RN called pts legal guardian and provided up date on plan for discharge.    Group Home: [facility] Address: [address]                  [place] # [id number]

## 2024-01-10 NOTE — ED Notes (Signed)
 Pt ABCs intact. RR even and unlabored. Pt in NAD. Bed in lowest locked position. Call bell in reach. Denies needs at this time.

## 2024-01-10 NOTE — ED Notes (Signed)
 This RN reviewed discharge instructions with Rosana Later, the [facility] Trainer who is taking over care of pt. Pt got into car safely and buckled himself in. Pt ABCs intact. RR even and unlabored. Pt in NAD. Denies needs at this time.

## 2024-01-10 NOTE — ED Notes (Signed)
vol/psych consult ordered/pending.. 

## 2024-01-10 NOTE — ED Notes (Signed)
 Charge RN able to get in contact and verify pts Group Home. Report was given to Group Home and staff member in route to pick pt up. Estimated ETA 0200. Pt aware and awaiting discharge.

## 2024-01-10 NOTE — ED Notes (Signed)
 Pt legal guardian aware of plan for discharge. Awaiting pt transportation to arrive for discharge of pt. Pt ABCs intact. RR even and unlabored. Pt in NAD. Bed in lowest locked position. Denies needs at this time.

## 2024-01-12 ENCOUNTER — Emergency Department
Admission: EM | Admit: 2024-01-12 | Discharge: 2024-01-12 | Disposition: A | Discharge status: Home / Self Care | Attending: Emergency Medicine | Admitting: Emergency Medicine

## 2024-01-12 ENCOUNTER — Other Ambulatory Visit: Payer: Self-pay

## 2024-01-12 DIAGNOSIS — F4325 Adjustment disorder with mixed disturbance of emotions and conduct: Secondary | ICD-10-CM | POA: Insufficient documentation

## 2024-01-12 DIAGNOSIS — I10 Essential (primary) hypertension: Secondary | ICD-10-CM | POA: Insufficient documentation

## 2024-01-12 DIAGNOSIS — R519 Headache, unspecified: Secondary | ICD-10-CM | POA: Insufficient documentation

## 2024-01-12 DIAGNOSIS — E119 Type 2 diabetes mellitus without complications: Secondary | ICD-10-CM | POA: Insufficient documentation

## 2024-01-12 DIAGNOSIS — R440 Auditory hallucinations: Secondary | ICD-10-CM | POA: Insufficient documentation

## 2024-01-12 DIAGNOSIS — G8929 Other chronic pain: Secondary | ICD-10-CM | POA: Diagnosis not present

## 2024-01-12 DIAGNOSIS — F4329 Adjustment disorder with other symptoms: Secondary | ICD-10-CM

## 2024-01-12 DIAGNOSIS — R45851 Suicidal ideations: Secondary | ICD-10-CM | POA: Diagnosis not present

## 2024-01-12 LAB — COMPREHENSIVE METABOLIC PANEL WITH GFR
ALT: 23 U/L (ref 0–44)
AST: 23 U/L (ref 15–41)
Albumin: 4 g/dL (ref 3.5–5.0)
Alkaline Phosphatase: 95 U/L (ref 38–126)
Anion gap: 7 (ref 5–15)
BUN: 14 mg/dL (ref 6–20)
CO2: 23 mmol/L (ref 22–32)
Calcium: 9.4 mg/dL (ref 8.9–10.3)
Chloride: 108 mmol/L (ref 98–111)
Creatinine, Ser: 0.71 mg/dL (ref 0.61–1.24)
GFR, Estimated: >60 mL/min (ref >60)
Glucose, Bld: 139 mg/dL — ABNORMAL HIGH (ref 70–99)
Potassium: 3.8 mmol/L (ref 3.5–5.1)
Sodium: 138 mmol/L (ref 135–145)
Total Bilirubin: 0.7 mg/dL (ref 0.0–1.2)
Total Protein: 6.5 g/dL (ref 6.5–8.1)

## 2024-01-12 LAB — URINE DRUG SCREEN, QUALITATIVE (ARMC ONLY)
Amphetamines, Ur Screen: NOT DETECTED
Barbiturates, Ur Screen: NOT DETECTED
Benzodiazepine, Ur Scrn: NOT DETECTED
Cannabinoid 50 Ng, Ur ~~LOC~~: NOT DETECTED
Cocaine Metabolite,Ur ~~LOC~~: NOT DETECTED
MDMA (Ecstasy)Ur Screen: NOT DETECTED
Methadone Scn, Ur: NOT DETECTED
Opiate, Ur Screen: NOT DETECTED
Phencyclidine (PCP) Ur S: NOT DETECTED
Tricyclic, Ur Screen: NOT DETECTED

## 2024-01-12 LAB — CBC
HCT: 37.1 % — ABNORMAL LOW (ref 39.0–52.0)
Hemoglobin: 12.2 g/dL — ABNORMAL LOW (ref 13.0–17.0)
MCH: 28.4 pg (ref 26.0–34.0)
MCHC: 32.9 g/dL (ref 30.0–36.0)
MCV: 86.3 fL (ref 80.0–100.0)
Platelets: 167 10*3/uL (ref 150–400)
RBC: 4.3 MIL/uL (ref 4.22–5.81)
RDW: 13.6 % (ref 11.5–15.5)
WBC: 7.8 10*3/uL (ref 4.0–10.5)
nRBC: 0 % (ref 0.0–0.2)

## 2024-01-12 LAB — ETHANOL: Alcohol, Ethyl (B): <15 mg/dL (ref <15)

## 2024-01-12 LAB — ACETAMINOPHEN LEVEL: Acetaminophen (Tylenol), Serum: <10 ug/mL — ABNORMAL LOW (ref 10–30)

## 2024-01-12 LAB — SALICYLATE LEVEL: Salicylate Lvl: <7 mg/dL — ABNORMAL LOW (ref 7.0–30.0)

## 2024-01-12 MED ORDER — OLANZAPINE 5 MG PO TBDP
5.0000 mg | ORAL_TABLET | Freq: Once | ORAL | Status: AC
  Administered 2024-01-12: 5 mg via ORAL
  Filled 2024-01-12: qty 1

## 2024-01-12 MED ORDER — TRAZODONE HCL 100 MG PO TABS: 100.0000 mg | ORAL_TABLET | Freq: Every day | ORAL | 1 refills | Status: DC

## 2024-01-12 MED ORDER — ACETAMINOPHEN 500 MG PO TABS
1000.0000 mg | ORAL_TABLET | Freq: Once | ORAL | Status: AC
  Administered 2024-01-12: 1000 mg via ORAL
  Filled 2024-01-12: qty 2

## 2024-01-12 MED ORDER — TRAZODONE HCL 50 MG PO TABS: 50.0000 mg | ORAL_TABLET | Freq: Every day | ORAL | 0 refills | Status: DC

## 2024-01-12 NOTE — ED Notes (Signed)
 Snacks and water given to pt

## 2024-01-12 NOTE — ED Notes (Signed)
VOL  pending  consult 

## 2024-01-12 NOTE — ED Notes (Signed)
 VOL moved to [facility] pending disposition

## 2024-01-12 NOTE — ED Notes (Signed)
 Pt hitting head against wall and hit head with fist. RN verbally deescalated pt and stops pt from physically harming self. Pt appears to be calmer and more cooperative after descalation tactics. Provider made aware.

## 2024-01-12 NOTE — ED Notes (Signed)
 Called group home and group home states they will not take him back. The group home is concerned for elopement wondering risks. This RN let the team taking care of this patient know including MD, TTS, Social work.

## 2024-01-12 NOTE — ED Triage Notes (Addendum)
 Pt to ED via [employer] VOL c/o auditory hallucinations and headache for several years. Pt states he is feeling SI

## 2024-01-12 NOTE — ED Provider Notes (Signed)
 [facility] Provider Note    Event Date/Time   First MD Initiated Contact with Patient 01/12/24 [phone]     (approximate)   History   Hallucinations   HPI  Derrick Atkins is a 53 y.o. male with history of schizophrenia, diabetes, hypertension, presenting with auditory hallucinations as well as SI.  States that he has been having headaches as well but no recent trauma or falls.  He denies any new weakness or numbness, vision changes.  States that he does not have a plan.  States that his voices are telling him to call 911 which he did.  Per independent history from PD, he comes in as a voluntary, is complaining about auditory hallucinations as well as headaches for several years, is also feeling passive SI.  On independent chart review, he was seen by psych at the end of June, is coming from a group home, reports hearing voices but denied SI or HI at that time, he was cleared for discharge.     Physical Exam   Triage Vital Signs: ED Triage Vitals  Encounter Vitals Group     BP 01/12/24 0327 132/83     Girls Systolic BP Percentile --      Girls Diastolic BP Percentile --      Boys Systolic BP Percentile --      Boys Diastolic BP Percentile --      Pulse Rate 01/12/24 0327 88     Resp 01/12/24 0327 18     Temp 01/12/24 0327 97.6 F (36.4 C)     Temp src --      SpO2 01/12/24 0327 99 %     Weight 01/12/24 0326 190 lb (86.2 kg)     Height 01/12/24 0326 6' 4 (1.93 m)     Head Circumference --      Peak Flow --      Pain Score 01/12/24 0325 9     Pain Loc --      Pain Education --      Exclude from Growth Chart --     Most recent vital signs: Vitals:   01/12/24 0327  BP: 132/83  Pulse: 88  Resp: 18  Temp: 97.6 F (36.4 C)  SpO2: 99%     General: Awake, no distress.  CV:  Good peripheral perfusion.  Resp:  Normal effort.  No tachypnea or respiratory distress Abd:  No distention.  Soft nontender Other:  Calm, no obvious external  trauma, pupils are equal and reactive, no obvious facial asymmetry, no obvious weakness or numbness   ED Results / Procedures / Treatments   Labs (all labs ordered are listed, but only abnormal results are displayed) Labs Reviewed  COMPREHENSIVE METABOLIC PANEL WITH GFR - Abnormal; Notable for the following components:      Result Value   Glucose, Bld 139 (*)    All other components within normal limits  CBC - Abnormal; Notable for the following components:   Hemoglobin 12.2 (*)    HCT 37.1 (*)    All other components within normal limits  SALICYLATE LEVEL - Abnormal; Notable for the following components:   Salicylate Lvl <7.0 (*)    All other components within normal limits  ACETAMINOPHEN  LEVEL - Abnormal; Notable for the following components:   Acetaminophen  (Tylenol ), Serum <10 (*)    All other components within normal limits  ETHANOL  URINE DRUG SCREEN, QUALITATIVE ([facility] ONLY)      PROCEDURES:  Critical Care  performed: No  Procedures   MEDICATIONS ORDERED IN ED: Medications - No data to display   IMPRESSION / MDM / ASSESSMENT AND PLAN / ED COURSE  I reviewed the triage vital signs and the nursing notes.                              Differential diagnosis includes, but is not limited to, headache appears to be chronic, no obvious new deficits, no recent trauma, suspect tension headache, migraines, will give him a Tylenol .  For the SI, he states passive SI with chronic auditory hallucinations, he is calm at this time, we will keep him as a voluntary since he is agreeable to stay for psychiatry.  Will get screening labs.  Patient's presentation is most consistent with acute presentation with potential threat to life or bodily function.  Independent interpretation of labs below.  Patient is medically cleared for psych.    Clinical Course as of 01/12/24 0411  Thu Jan 12, 2024  0410 Independent review of labs, Tylenol , salicylate, ethanol levels are not elevated,  no leukocytosis, electrolytes not severely deranged, LFTs are normal [TT]    Clinical Course User Index [TT] Waymond, Lorelle Cummins, MD     FINAL CLINICAL IMPRESSION(S) / ED DIAGNOSES   Final diagnoses:  Suicidal ideation  Chronic nonintractable headache, unspecified headache type  Auditory hallucinations     Rx / DC Orders   ED Discharge Orders     None        Note:  This document was prepared using Dragon voice recognition software and may include unintentional dictation errors.    Waymond Lorelle Cummins, MD 01/12/24 [phone]

## 2024-01-12 NOTE — ED Notes (Signed)
 Pt breakfast provided at bedside

## 2024-01-12 NOTE — ED Notes (Signed)
 Chaplain attempted to speak with patient, patient nodding off during interaction. Chaplain informed this RN to call back when patient awake if he is still wanting to speak with them.

## 2024-01-12 NOTE — ED Notes (Signed)
 All belongings returned to patient, walked to front door, to meet [facility] staff, discussed d/c paperwork with [facility] owner Rosana and patient

## 2024-01-12 NOTE — ED Notes (Signed)
 Legal guardian called and asked to speak with a social worker or TTS team member about patients plan of care. This RN reached out to Child psychotherapist and left the Engineer, manufacturing with call back number for legal guardian.

## 2024-01-12 NOTE — ED Notes (Signed)
 Spoke with Rosana from [facility], explained he will need to come get patient, informed that he cannot abandon the patient at the ER and he is cleared for d/c, informed Gh that it is their job to work with the guardian to find new placement for resident since he is not a danger to himself or staff he should be allowed to return, [facility] agreed to come get patient at 6pm

## 2024-01-12 NOTE — BH Assessment (Signed)
 Comprehensive Clinical Assessment (CCA) Screening, Triage and Referral Note  01/12/2024 Derrick Atkins [id number] Recommendations for Services/Supports/Treatments: Psych consult/Disposition pending. Derrick Atkins is a 53 year old Caucasian, Not Hispanic or Latino ethnicity, ENGLISH speaking male. Pt presented to [facility] ED voluntarily. Per triage note: Pt to ED via [facility] c/o auditory hallucinations and headache for several years. Pt states he is feeling SI. On assessment, the patient was visibly depressed and tearful. Pt was expansive about his reasons for presenting to the hospital explaining that he has been having issues with auditory hallucinations, feelings of sadness, and thoughts of passive SI. Pt explained that he doesn't have any joy, feels hopeless, and that his life is over. Pt also reported that he struggles with sleep. Pt reported that he feels hopeless and that he has nothing to live for due to having no family/friends. Pt complained of severe head/back pain throughout the assessment. Pt's speech was soft, but coherent. Pt had linear thoughts. Pt presented with a depressed mood; affect was congruent. The patient denied current HI, and V/H. Pt reports having command hallucinations. Pt agreed to speak with chaplain.  Chief Complaint:  Chief Complaint  Patient presents with   Hallucinations   Visit Diagnosis: Schizophrenia   Patient Reported Information How did you hear about us ? Other (Comment)  What Is the Reason for Your Visit/Call Today? Pt arriving from Group Home with [facility] for chronic back pain related to scoliosis and psych eval. Pt reports hearing voices in his head. Pt denies SI/HI from auditory hallucinations. Pt unable to describe what he is hearing. Pt ABCs intact. RR even and unlabored. Pt in NAD but looks uncomfortable  How Long Has This Been Causing You Problems? > than 6 months  What Do You Feel Would Help You the Most Today? Treatment for Depression or other mood problem; Social Support   Have You Recently Had Any Thoughts About Hurting Yourself? No  Are You Planning to Commit Suicide/Harm Yourself At This time? No   Have you Recently Had Thoughts About Hurting Someone [name]? No  Are You Planning to Harm Someone at This Time? No  Explanation: No data recorded  Have You Used Any Alcohol  or Drugs in the Past 24 Hours? No  How Long Ago Did You Use Drugs or Alcohol ? No data recorded What Did You Use and How Much? No data recorded  Do You Currently Have a Therapist/Psychiatrist? Yes  Name of Therapist/Psychiatrist: Via the care home   Have You Been Recently  Discharged From Any Office Practice or Programs? No  Explanation of Discharge From Practice/Program: No data recorded   CCA Screening Triage Referral Assessment Type of Contact: Face-to-Face  Telemedicine Service Delivery:   Is this Initial or Reassessment?   Date Telepsych consult ordered in [facility]:    Time Telepsych consult ordered in [facility]:    Location of Assessment: [facility] ED  Provider Location: [facility] ED    Collateral Involvement: None   Does Patient Have a Court Appointed Legal Guardian? No data recorded Name and Contact of Legal Guardian: No data recorded If Minor and Not Living with Parent(s), Who has Custody? No data recorded Is CPS involved or ever been involved? Never  Is APS involved or ever been involved? Never   Patient Determined To Be At Risk for Harm To Self or Others Based on Review of Patient Reported Information or Presenting Complaint? No  Method: No Plan  Availability of Means: No access or NA  Intent: Vague intent or NA  Notification Required: No need or identified person  Additional Information for Danger to Others Potential: No data recorded Additional Comments for Danger to Others Potential: No data recorded Are There Guns or Other Weapons in Your Home? No  Types of Guns/Weapons: No data recorded Are These Weapons Safely Secured?                            No  Who Could Verify You Are Able To Have These Secured: No data recorded Do You Have any Outstanding Charges, Pending Court Dates, Parole/Probation? None  Contacted To Inform of Risk of Harm To Self or Others: No data recorded  Does Patient Present under Involuntary Commitment? No    [place] of Residence: [place]   Patient Currently Receiving the Following Services: Group Home; Medication Management   Determination of Need: Emergent (2 hours)   Options For Referral: Medication Management; Inpatient Hospitalization   Disposition Recommendation per psychiatric provider: [place] consult  pending.   Derrick Atkins, LCAS

## 2024-01-12 NOTE — ED Notes (Signed)
 Provided patient with peaches and bagel and apple juice because patient stated he did not receive breakfast in the short term psych hold area before he was transferred to the long term psych area.

## 2024-01-12 NOTE — TOC CM/SW Note (Addendum)
 TOC received a request to call the patients legal guardian Derrick Atkins [phone]. Derrick Atkins requested additional information on patient's treatment plan. She advised that the patient left [facility] and can not go back there.  BH has cleared the patient. He is medically clear for discharge.  TOC outreached to Momence at the group home. Per Derrick Atkins the patient is too much of a risk to stay at the group home. The patient eloped from the group home and called the police unwarranted several nights this week. He advised he spoke with the legal guardian and she is in agreement that the patient needs a higher LOC. Derrick Atkins advised that he found out after he accepted the patient had eloped from 2 additional facilities.   TOC reached out to Derrick Atkins to advise the patient is clear for discharge. Per Derrick Atkins the patient doesn't have a place to go. TOC advised that the patient can not board in the ED, because ED beds are needed to care for patients with medical needs. TOC asked the guardian to come up with an alternate plan. TOC asked if a hotel would be acceptable and Derrick Atkins explained that an incompetent adult can't be discharged to a hotel. The guardian advised that they would look for a place next week due to tomorrow being a holiday. TOC advised that it is not appropriate for the patient to board in the ED. Derrick Atkins advised that she would have her supervisor call to speak with TOC.  TOC received a call from APS Supervisor, Derrick Atkins. Derrick Atkins advised that the patient should be admitted to psych. TOC shared the patient is here voluntary and has been cleared for discharge by Psych. Derrick Atkins insisted that the patient should be admitted to [facility]. TOC reiterated that Psych did not admit the patient and he is here voluntarily TOC advised them of their options.

## 2024-01-12 NOTE — ED Notes (Signed)
 Lunch tray provided to pt, with ginger ale

## 2024-01-12 NOTE — Progress Notes (Signed)
 Derrick Atkins requested spiritual care support. Upon arrival he struggled to be alert. He shared my life is over. He could not elaborate. I offered him spiritual words of assurance. Encouraged nurse to call [facility] when he is alert.     01/12/24 0700  Spiritual Encounters  Type of Visit Follow up  Care provided to: Patient  Conversation partners present during encounter Nurse

## 2024-01-12 NOTE — BH Assessment (Signed)
 Writer attempted to contact pt's legal guardian Coolidge Estrin [phone]; however, there was no answer. HIPAA compliant voicemail left requesting a call back.

## 2024-01-12 NOTE — Consult Note (Signed)
 [facility] Psychiatric Consult Initial  Patient Name: .Derrick Atkins  MRN: [id number]  DOB: April 07, 1971  Consult Order details:  Orders (From admission, onward)     Start     Ordered   01/12/24 0350  CONSULT TO CALL ACT TEAM       Ordering Provider: Waymond Lorelle Cummins, MD  Provider:  (Not yet assigned)  Question:  Reason for Consult?  Answer:  Psych consult   01/12/24 0349   01/12/24 0350  IP CONSULT TO PSYCHIATRY       Ordering Provider: Waymond Lorelle Cummins, MD  Provider:  (Not yet assigned)  Question Answer Comment  Consult Timeframe STAT - requires a response within one hour   STAT timeframe requires provider to provider communication, has the provider to provider communication been completed Yes   Reason for Consult? Consult for medication management   Contact phone number where the requesting provider can be reached [phone]      01/12/24 0349             Mode of Visit: In person    Psychiatry Consult Evaluation  Service Date: January 12, 2024 LOS:  LOS: 0 days  Chief Complaint I have nobody to talk to  Primary Psychiatric Diagnoses  Adjustment disorder with emotional disturbance 2.   3.    Assessment  Derrick Atkins is a 53 y.o. male admitted: with history of schizophrenia, diabetes, hypertension, presenting with auditory hallucinations as well as SI.  States that he has been having headaches as well but no recent trauma or falls.  He denies any new weakness or numbness, vision changes.  States that he does not have a plan.  States that his voices are telling him to call 911 which he did.  Psychiatry is consulted for safety evaluation   On assessment patient reports having chronic hallucinations and chronic passive thoughts of self-harm with no active plan or intent.  He is able to display future oriented nests as he wants to have friends and want to hang out from the group home.  He reports he is at the new group home and has nothing to do there.  Patient is well-known to the  emergency room and has multiple ED visits with similar presentation.  Inpatient psychiatric hospitalization will be a highly restrictive setting at this point.  Will recommend patient getting connected to IOP programs or PHP programs where he can stay busy and have friends.  Legal guardian is aware of the plan.  Patient is psychiatrically cleared for discharge. Diagnoses:  Active Hospital problems: Active Problems:   * No active hospital problems. * Adjustment disorder with emotional disturbance  Plan   ## Psychiatric Medication Recommendations:  Continue home medicine  ## Medical Decision Making Capacity: Not specifically addressed in this encounter  ## Further Work-up:  -- No further workup recommended at this time   ## Disposition:-- There are no psychiatric contraindications to discharge at this time  ## Behavioral / Environmental: -Utilize compassion and acknowledge the patient's experiences while setting clear and realistic expectations for care.    ## Safety and Observation Level:  - Based on my clinical evaluation, I estimate the patient to be at chronic low risk of self harm in the current setting. - At this time, we recommend  routine. This decision is based on my review of the chart including patient's history and current presentation, interview of the patient, mental status examination, and consideration of suicide risk including evaluating suicidal ideation, plan, intent,  suicidal or self-harm behaviors, risk factors, and protective factors. This judgment is based on our ability to directly address suicide risk, implement suicide prevention strategies, and develop a safety plan while the patient is in the clinical setting. Please contact our team if there is a concern that risk level has changed.  CSSR Risk Category:C-SSRS RISK CATEGORY: High Risk  Suicide Risk Assessment: Patient has following modifiable risk factors for suicide: recent psychiatric hospitalization, which  we are addressing by connecting him to intensive outpatient program or PHP and patient lives at the group home where safety protocol is well implemented as medications are locked up under distributed only by the staff members and patient has no access to any sharp objects. Patient has following non-modifiable or demographic risk factors for suicide: male gender Patient has the following protective factors against suicide: Access to outpatient mental health care  Thank you for this consult request. Recommendations have been communicated to the primary team.  We will sign off at this time.   Allyn Foil, MD       History of Present Illness  Derrick Atkins is a 53 y.o. male admitted: with history of schizophrenia, diabetes, hypertension, presenting with auditory hallucinations as well as SI.  States that he has been having headaches as well but no recent trauma or falls.  He denies any new weakness or numbness, vision changes.  States that he does not have a plan.  States that his voices are telling him to call 911 which he did.  Psychiatry is consulted for safety evaluation  On chart review patient was recently discharged from [facility] on [facility]/2024 after patient displayed safe behaviors throughout the admission and denied SI/HI/plan on the day of discharge. Patient Report:  Patient reports that since he went to the new group home there were only 3 people in the group home and he feels bored.  He reports feeling lonely and needing some people to hang out.  He complained that there is no activity and nobody is taking him outside.  He reports that he was not feeling good as he was having headaches and so decided to come to the emergency room.  He reports taking Zyprexa  to help with the sleep and depression.  He reports his mood as sad as he feels lonely.  He reports having chronic visions and hearing people talk to him about what will happen in future.  He denies any acute worsening of the symptoms and  reports he always had them.  He reports chronic thoughts of self-harm but has no intention or plan of acting on them.  He reports he always struggled with those thoughts when he feels lonely.  He reports that he is able to distract those thoughts when he plays music and video games and he did inform the provider that he has access to those videogames in the group home as he states he has an iPad in the group home.  He denies homicidal ideation.  He denies anxiety but has ongoing panic attacks.  He did not want to talk about his trauma but did endorse chronic nightmares.  He denies current use of alcohol  or drugs and smoking cigarettes.  He reports Ms. Lonell from [facility] is her guardian    Psychiatric and Social History  Psychiatric History:  Information collected from chart/patient Past Psychiatric History: Patient reports this is the second admission to [facility] for behavioral health, reports many past longtime inpatient admissions including [facility], reports history  of at support, reports most prevalent diagnoses schizoaffective disorder, he is unable to provide current medication list, he reports past suicidal attempts including most recent I tried to suffocate myself with a pillow , he has history of psychosis endorsing current symptoms of hallucinations Previous Med Trials: Abilify  Maintena, Cogentin , Buspar , Prozac , Haldol , Hydroxyzine , Lamictal , Olanzapine       Social History:  Born in [place] , raised by mother and father, has 1 brother who is 8 years younger, parents are now deceased, never married, no children, high school graduation certificate, he currently has a guardian   Substance History Patient denies history of substance abuse denies alcohol  use, tobacco use, denies abuse of illegal as well as illegal substances  Exam Findings  Physical Exam: Reviewed and agree with the physical exam findings conducted by the medical provider  Hello Vital Signs:  Temp:  [97.6 F (36.4 C)] 97.6 F (36.4 C) (07/03 0814) Pulse Rate:  [74-88] 74 (07/03 0814) Resp:  [18] 18 (07/03 0814) BP: (128-132)/(71-83) 128/71 (07/03 0814) SpO2:  [99 %-100 %] 100 % (07/03 0814) Weight:  [86.2 kg] 86.2 kg (07/03 0326) Blood pressure 128/71, pulse 74, temperature 97.6 F (36.4 C), temperature source Oral, resp. rate 18, height 6' 4 (1.93 m), weight 86.2 kg, SpO2 100%. Body mass index is 23.13 kg/m.    Mental Status Exam: General Appearance: Fairly Groomed  Orientation:  Full (Time, Place, and Person)  Memory:  Immediate;   Fair Recent;   Fair Remote;   Fair  Concentration:  Concentration: Fair and Attention Span: Fair  Recall:  Fair  Attention  Poor  Eye Contact:  Minimal  Speech:  Slow  Language:  Fair  Volume:  Decreased  Mood: sad  Affect:  Appropriate  Thought Process:  Irrelevant  Thought Content:  Illogical  Suicidal Thoughts:  No  Homicidal Thoughts:  No  Judgement:  Impaired  Insight:  Shallow  Psychomotor Activity:  Normal  Akathisia:  No  Fund of Knowledge:  Poor      Assets:  Desire for Improvement  Cognition:  WNL  ADL's:  Intact  AIMS (if indicated):        Other History   These have been pulled in through the EMR, reviewed, and updated if appropriate.  Family History:  The patient's family history includes Heart attack in his father; Heart disease in his father.  Medical History: Past Medical History:  Diagnosis Date   Cerebral palsy (HCC)    Depression    Diabetes mellitus without complication (HCC)    Hypertension    Kidney stones    Schizoaffective disorder (HCC)    Scoliosis     Surgical History: Past Surgical History:  Procedure Laterality Date   BOWEL RESECTION     CATARACT EXTRACTION W/PHACO Right 09/15/2022   Procedure: CATARACT EXTRACTION PHACO AND INTRAOCULAR LENS PLACEMENT (IOC) RIGHT DIABETIC VISION BLUE HEALON 5  15.79  01:31.9;  Surgeon: Mittie Gaskin, MD;  Location:  [facility];  Service: Ophthalmology;  Laterality: Right;  Latex Diabetic   INCISION AND DRAINAGE ABSCESS Left 12/20/2022   Procedure: INCISION AND DRAINAGE ABSCESS;  Surgeon: Lane Shope, MD;  Location: [facility];  Service: General;  Laterality: Left;   LAPAROTOMY N/A 05/15/2020   Procedure: EXPLORATORY LAPAROTOMY;  Surgeon: Desiderio Schanz, MD;  Location: [facility];  Service: General;  Laterality: N/A;     Medications:  No current facility-administered medications for this encounter.  Current Outpatient Medications:    albuterol  (VENTOLIN  HFA) 108 (90  Base) MCG/ACT inhaler, Inhale 2 puffs into the lungs every 4 (four) hours as needed for shortness of breath or wheezing., Disp: 17 each, Rfl: 0   ascorbic acid  (VITAMIN C ) 500 MG tablet, Take 1 tablet (500 mg total) by mouth daily., Disp: 30 tablet, Rfl: 3   aspirin  EC 81 MG tablet, Take 1 tablet (81 mg total) by mouth daily. Swallow whole., Disp: 30 tablet, Rfl: 12   benztropine  (COGENTIN ) 1 MG tablet, Take 1 tablet (1 mg total) by mouth daily., Disp: 30 tablet, Rfl: 0   busPIRone  (BUSPAR ) 15 MG tablet, Take 1 tablet (15 mg total) by mouth 3 (three) times daily., Disp: 90 tablet, Rfl: 0   cholecalciferol  (VITAMIN D3) 25 MCG (1000 UNIT) tablet, Take 1 tablet (1,000 Units total) by mouth daily., Disp: 30 tablet, Rfl: 0   cyanocobalamin  (VITAMIN B12) 1000 MCG tablet, Take 1 tablet (1,000 mcg total) by mouth daily., Disp: 30 tablet, Rfl: 0   fenofibrate  54 MG tablet, Take 1 tablet (54 mg total) by mouth daily., Disp: 30 tablet, Rfl: 0   FLUoxetine  (PROZAC ) 20 MG capsule, Take 1 capsule (20 mg total) by mouth daily., Disp: 30 capsule, Rfl: 0   folic acid  (FOLVITE ) 1 MG tablet, Take 1 tablet (1 mg total) by mouth daily., Disp: 30 tablet, Rfl: 0   iron  polysaccharides (NIFEREX) 150 MG capsule, Take 1 capsule (150 mg total) by mouth daily., Disp: 30 capsule, Rfl: 0   levothyroxine  (SYNTHROID ) 88 MCG tablet, Take 1 tablet (88 mcg total) by  mouth daily at 6 (six) AM., Disp: 30 tablet, Rfl: 0   LINZESS  290 MCG CAPS capsule, Take 1 capsule (290 mcg total) by mouth daily., Disp: 30 capsule, Rfl: 0   metoprolol  tartrate (LOPRESSOR ) 25 MG tablet, Take 1 tablet (25 mg total) by mouth 2 (two) times daily., Disp: 30 tablet, Rfl: 0   OLANZapine  (ZYPREXA ) 15 MG tablet, Take 1 tablet (15 mg total) by mouth at bedtime., Disp: 30 tablet, Rfl: 0   pantoprazole  (PROTONIX ) 40 MG tablet, Take 1 tablet (40 mg total) by mouth daily before breakfast., Disp: 30 tablet, Rfl: 0   polyethylene glycol (MIRALAX  / GLYCOLAX ) 17 g packet, Take 17 g by mouth daily., Disp: 30 packet, Rfl: 0   potassium chloride  SA (KLOR-CON  M) 20 MEQ tablet, Take 1 tablet (20 mEq total) by mouth daily., Disp: 30 tablet, Rfl: 0   rosuvastatin  (CRESTOR ) 5 MG tablet, Take 1 tablet (5 mg total) by mouth daily., Disp: 30 tablet, Rfl: 0   senna (SENOKOT) 8.6 MG TABS tablet, Take 2 tablets by mouth at bedtime., Disp: , Rfl:    VENTOLIN  HFA 108 (90 Base) MCG/ACT inhaler, Inhale into the lungs every 4 (four) hours as needed., Disp: , Rfl:    traZODone  (DESYREL ) 50 MG tablet, Take 1 tablet (50 mg total) by mouth at bedtime., Disp: 30 tablet, Rfl: 0  Allergies: Allergies  Allergen Reactions   Valproic Acid  And Related Other (See Comments)    Hyperammonemic encephalopathy   Phenytoin Sodium Extended Other (See Comments) and Nausea And Vomiting   Prednisone Hives and Nausea And Vomiting   Latex Hives, Nausea And Vomiting and Rash    Tremon Sainvil, MD

## 2024-01-12 NOTE — ED Notes (Addendum)
 Patient provided materials to take shower. Patient in shower.

## 2024-01-12 NOTE — ED Notes (Signed)
 Pt belongings:  Delores wallet [employer]

## 2024-01-13 ENCOUNTER — Telehealth: Payer: Self-pay | Admitting: Emergency Medicine

## 2024-01-13 MED ORDER — TRAZODONE HCL 50 MG PO TABS: 50.0000 mg | ORAL_TABLET | Freq: Every day | ORAL | 0 refills | Status: AC

## 2024-01-13 NOTE — Telephone Encounter (Addendum)
 Received phone call from patient's group home director.  He advises they are not able to fill prescription for trazodone  at the current pharmacy.  He would like for us  to send a prescription to [facility] pharmacy in [place] instead.  They will not fill the trazodone  prescription sent to [facility]  Notes yesterday:   traZODone  (DESYREL ) 50 MG tablet [[id number]]   Order Details Dose: 50 mg Route: Oral Frequency: Daily at bedtime  Dispense Quantity: 30 tablet (30 day supply) Refills: 0   Duration: -- Dispense As Written: No

## 2024-01-28 ENCOUNTER — Emergency Department
Admission: EM | Admit: 2024-01-28 | Discharge: 2024-02-02 | Disposition: A | Discharge status: Home / Self Care | Attending: Emergency Medicine | Admitting: Emergency Medicine

## 2024-01-28 ENCOUNTER — Other Ambulatory Visit: Payer: Self-pay

## 2024-01-28 DIAGNOSIS — F32A Depression, unspecified: Secondary | ICD-10-CM | POA: Insufficient documentation

## 2024-01-28 DIAGNOSIS — E119 Type 2 diabetes mellitus without complications: Secondary | ICD-10-CM | POA: Diagnosis not present

## 2024-01-28 DIAGNOSIS — Z79899 Other long term (current) drug therapy: Secondary | ICD-10-CM | POA: Diagnosis not present

## 2024-01-28 DIAGNOSIS — F419 Anxiety disorder, unspecified: Secondary | ICD-10-CM | POA: Insufficient documentation

## 2024-01-28 DIAGNOSIS — I10 Essential (primary) hypertension: Secondary | ICD-10-CM | POA: Insufficient documentation

## 2024-01-28 DIAGNOSIS — F250 Schizoaffective disorder, bipolar type: Secondary | ICD-10-CM | POA: Insufficient documentation

## 2024-01-28 LAB — CBC
HCT: 36.7 % — ABNORMAL LOW (ref 39.0–52.0)
Hemoglobin: 12.5 g/dL — ABNORMAL LOW (ref 13.0–17.0)
MCH: 28.3 pg (ref 26.0–34.0)
MCHC: 34.1 g/dL (ref 30.0–36.0)
MCV: 83.2 fL (ref 80.0–100.0)
Platelets: 147 K/uL — ABNORMAL LOW (ref 150–400)
RBC: 4.41 MIL/uL (ref 4.22–5.81)
RDW: 13.2 % (ref 11.5–15.5)
WBC: 7.9 K/uL (ref 4.0–10.5)
nRBC: 0 % (ref 0.0–0.2)

## 2024-01-28 LAB — COMPREHENSIVE METABOLIC PANEL WITH GFR
ALT: 16 U/L (ref 0–44)
AST: 19 U/L (ref 15–41)
Albumin: 4 g/dL (ref 3.5–5.0)
Alkaline Phosphatase: 111 U/L (ref 38–126)
Anion gap: 8 (ref 5–15)
BUN: 14 mg/dL (ref 6–20)
CO2: 23 mmol/L (ref 22–32)
Calcium: 9.1 mg/dL (ref 8.9–10.3)
Chloride: 110 mmol/L (ref 98–111)
Creatinine, Ser: 0.79 mg/dL (ref 0.61–1.24)
GFR, Estimated: >60 mL/min (ref >60)
Glucose, Bld: 164 mg/dL — ABNORMAL HIGH (ref 70–99)
Potassium: 4 mmol/L (ref 3.5–5.1)
Sodium: 141 mmol/L (ref 135–145)
Total Bilirubin: 0.9 mg/dL (ref 0.0–1.2)
Total Protein: 6.5 g/dL (ref 6.5–8.1)

## 2024-01-28 LAB — ETHANOL: Alcohol, Ethyl (B): <15 mg/dL (ref <15)
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
## 2024-01-28 NOTE — ED Notes (Signed)
 Lunch tray provided to pt.

## 2024-01-28 NOTE — ED Notes (Signed)
Pt provided sandwich tray 

## 2024-01-28 NOTE — ED Notes (Addendum)
 Pt belongings:  1 pair of slip on shoes 1 pair of white socks 1 pair of blue jeans 1 white shirt  1 brown wallet 1 pair of pattern boxers

## 2024-01-28 NOTE — ED Notes (Signed)
Snacks given to pt.
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
## 2024-01-28 NOTE — ED Notes (Signed)
 Pt given snack and cup of water at this time.

## 2024-01-28 NOTE — ED Notes (Signed)
 Pt provided cereal and milk upon request

## 2024-01-28 NOTE — ED Provider Notes (Signed)
   [facility] Provider Note    Event Date/Time   First MD Initiated Contact with Patient 01/28/24 [phone]     (approximate)  History   Chief Complaint: Suicidal  HPI  AWESOME JARED is a 53 y.o. male with a past medical history of cerebral palsy, diabetes, hypertension, schizophrenia, presents to the emergency department for worsening depression.  According to the patient he has been having worsening depression and anxiety.  States he has been having dreams of things happening.  Contrary to the triage note patient denies any plans to hurt himself.  Patient denies any medical complaints.  Physical Exam   Triage Vital Signs: ED Triage Vitals  Encounter Vitals Group     BP 01/28/24 0705 122/85     Girls Systolic BP Percentile --      Girls Diastolic BP Percentile --      Boys Systolic BP Percentile --      Boys Diastolic BP Percentile --      Pulse Rate 01/28/24 0705 84     Resp 01/28/24 0705 18     Temp 01/28/24 0705 97.9 F (36.6 C)     Temp Source 01/28/24 0705 Oral     SpO2 01/28/24 0705 100 %     Weight 01/28/24 0706 189 lb 9.5 oz (86 kg)     Height 01/28/24 0706 6' 4 (1.93 m)     Head Circumference --      Peak Flow --      Pain Score 01/28/24 0705 8     Pain Loc --      Pain Education --      Exclude from Growth Chart --     Most recent vital signs: Vitals:   01/28/24 0705  BP: 122/85  Pulse: 84  Resp: 18  Temp: 97.9 F (36.6 C)  SpO2: 100%    General: Awake, no distress.  CV:  Good peripheral perfusion.  Regular rate and rhythm  Resp:  Normal effort.  Equal breath sounds bilaterally.  Abd:  No distention.    ED Results / Procedures / Treatments   MEDICATIONS ORDERED IN ED: Medications - No data to display   IMPRESSION / MDM / ASSESSMENT AND PLAN / ED COURSE  I reviewed the triage vital signs and the nursing notes.  Patient's presentation is most consistent with acute presentation with potential threat to life or bodily  function.  Patient presents to the emergency department for worsening depression and anxiety.  Patient denies any alcohol  or drug use.  Patient denies any active plan to hurt himself but states he has been having visions of bad things happening to him.  Patient's medical workup is so far reassuring with reassuring vital signs normal CBC normal chemistry normal ethanol.  We will have psychiatry and TTS evaluate.  Patient denies any plan to hurt himself, does not appear to meet IVC criteria at this time.  FINAL CLINICAL IMPRESSION(S) / ED DIAGNOSES   Depression Anxiety   Note:  This document was prepared using Dragon voice recognition software and may include unintentional dictation errors.   Dorothyann Drivers, MD 01/28/24 [phone]

## 2024-01-28 NOTE — ED Notes (Signed)
 This RN contacted [name] (legal Guardian) to make her aware of pt being here, no answer, HIPAA message left and call back number given.

## 2024-01-28 NOTE — ED Notes (Signed)
 Pt Has been given his Psychologist, forensic

## 2024-01-28 NOTE — ED Triage Notes (Signed)
 Pt states he is feeling suicidal as well as depressed as anxious. Pt does endorse plan to run out in front of traffic. Pt is calm and cooperative in triage. Pt states he wants to speak to someone about all of these things.

## 2024-01-28 NOTE — ED Notes (Signed)
 Patient is vol pending result of his consult

## 2024-01-29 DIAGNOSIS — F32A Depression, unspecified: Secondary | ICD-10-CM | POA: Diagnosis not present

## 2024-01-29 MED ORDER — FLUOXETINE HCL 20 MG PO CAPS
20.0000 mg | ORAL_CAPSULE | Freq: Every day | ORAL | Status: DC
  Administered 2024-01-29 – 2024-02-02 (×5): 20 mg via ORAL
  Filled 2024-01-29 (×5): qty 1

## 2024-01-29 MED ORDER — POLYSACCHARIDE IRON COMPLEX 150 MG PO CAPS
150.0000 mg | ORAL_CAPSULE | Freq: Every day | ORAL | Status: DC
  Administered 2024-01-30 – 2024-02-02 (×4): 150 mg via ORAL
  Filled 2024-01-29 (×5): qty 1

## 2024-01-29 MED ORDER — LINACLOTIDE 145 MCG PO CAPS
290.0000 ug | ORAL_CAPSULE | Freq: Every day | ORAL | Status: DC
  Administered 2024-02-01 – 2024-02-02 (×2): 290 ug via ORAL
  Filled 2024-01-29 (×3): qty 2
  Filled 2024-01-29: qty 1
  Filled 2024-01-29: qty 2

## 2024-01-29 MED ORDER — ALBUTEROL SULFATE HFA 108 (90 BASE) MCG/ACT IN AERS: 2.0000 | INHALATION_SPRAY | RESPIRATORY_TRACT | Status: DC | PRN

## 2024-01-29 MED ORDER — LEVOTHYROXINE SODIUM 88 MCG PO TABS
88.0000 ug | ORAL_TABLET | Freq: Every day | ORAL | Status: DC
  Administered 2024-01-30 – 2024-02-02 (×4): 88 ug via ORAL
  Filled 2024-01-29 (×4): qty 1

## 2024-01-29 MED ORDER — METOPROLOL TARTRATE 50 MG PO TABS
25.0000 mg | ORAL_TABLET | Freq: Two times a day (BID) | ORAL | Status: DC
  Administered 2024-01-29 – 2024-02-02 (×8): 25 mg via ORAL
  Filled 2024-01-29 (×2): qty 1
  Filled 2024-01-29 (×8): qty 0.5

## 2024-01-29 MED ORDER — ASPIRIN 81 MG PO TBEC
81.0000 mg | DELAYED_RELEASE_TABLET | Freq: Every day | ORAL | Status: DC
  Administered 2024-01-29 – 2024-02-02 (×5): 81 mg via ORAL
  Filled 2024-01-29 (×5): qty 1

## 2024-01-29 MED ORDER — POTASSIUM CHLORIDE CRYS ER 20 MEQ PO TBCR
20.0000 meq | EXTENDED_RELEASE_TABLET | Freq: Every day | ORAL | Status: DC
  Administered 2024-01-29 – 2024-02-02 (×5): 20 meq via ORAL
  Filled 2024-01-29 (×5): qty 1

## 2024-01-29 MED ORDER — PANTOPRAZOLE SODIUM 40 MG PO TBEC
40.0000 mg | DELAYED_RELEASE_TABLET | Freq: Every day | ORAL | Status: DC
  Administered 2024-01-30 – 2024-02-02 (×4): 40 mg via ORAL
  Filled 2024-01-29 (×4): qty 1

## 2024-01-29 MED ORDER — ROSUVASTATIN CALCIUM 5 MG PO TABS
5.0000 mg | ORAL_TABLET | Freq: Every day | ORAL | Status: DC
  Administered 2024-01-30 – 2024-02-02 (×4): 5 mg via ORAL
  Filled 2024-01-29 (×7): qty 1

## 2024-01-29 MED ORDER — TRAZODONE HCL 50 MG PO TABS
50.0000 mg | ORAL_TABLET | Freq: Every day | ORAL | Status: DC
  Administered 2024-01-29 – 2024-02-01 (×4): 50 mg via ORAL
  Filled 2024-01-29 (×4): qty 1

## 2024-01-29 MED ORDER — OLANZAPINE 5 MG PO TABS
15.0000 mg | ORAL_TABLET | Freq: Every day | ORAL | Status: DC
  Administered 2024-01-29 – 2024-02-01 (×4): 15 mg via ORAL
  Filled 2024-01-29 (×4): qty 1

## 2024-01-29 MED ORDER — FENOFIBRATE 54 MG PO TABS
54.0000 mg | ORAL_TABLET | Freq: Every day | ORAL | Status: DC
  Administered 2024-01-30 – 2024-02-02 (×4): 54 mg via ORAL
  Filled 2024-01-29 (×5): qty 1

## 2024-01-29 MED ORDER — FOLIC ACID 1 MG PO TABS
1.0000 mg | ORAL_TABLET | Freq: Every day | ORAL | Status: DC
  Administered 2024-01-29 – 2024-02-02 (×5): 1 mg via ORAL
  Filled 2024-01-29 (×5): qty 1

## 2024-01-29 MED ORDER — BENZTROPINE MESYLATE 1 MG PO TABS
1.0000 mg | ORAL_TABLET | Freq: Every day | ORAL | Status: DC
  Administered 2024-01-29 – 2024-02-02 (×5): 1 mg via ORAL
  Filled 2024-01-29 (×5): qty 1

## 2024-01-29 MED ORDER — BUSPIRONE HCL 10 MG PO TABS
15.0000 mg | ORAL_TABLET | Freq: Three times a day (TID) | ORAL | Status: DC
  Administered 2024-01-29 – 2024-02-02 (×11): 15 mg via ORAL
  Filled 2024-01-29 (×11): qty 2

## 2024-01-29 MED ORDER — POLYETHYLENE GLYCOL 3350 17 G PO PACK
17.0000 g | PACK | Freq: Every day | ORAL | Status: DC
  Administered 2024-01-30 – 2024-02-02 (×4): 17 g via ORAL
  Filled 2024-01-29 (×4): qty 1

## 2024-01-29 NOTE — ED Notes (Signed)
 Assumed care of patient, pt awake, walking in hallway. Door to dayroom has been opened, pt in no distress

## 2024-01-29 NOTE — ED Notes (Signed)
 Patient is vol pending psych admit

## 2024-01-29 NOTE — ED Notes (Signed)
 Snacks given

## 2024-01-29 NOTE — ED Notes (Signed)
 Dinner tray provided to pt

## 2024-01-29 NOTE — ED Notes (Signed)
 Snacks and water given to pt

## 2024-01-29 NOTE — ED Notes (Signed)
 Lunch tray provided to pt.

## 2024-01-29 NOTE — ED Notes (Signed)
 Vol/consult done/recommended for inpatient psychiatric admission.

## 2024-01-29 NOTE — ED Notes (Signed)
 Pt is now out of shower and all items have been disposed of properly.

## 2024-01-29 NOTE — ED Notes (Signed)
 Pt provided with shower supplies including scrub pants and shirt, socks, underwear, towel, washcloths, and hygiene items. Pt is currently in shower.

## 2024-01-29 NOTE — ED Notes (Signed)
Pt provided with breakfast tray and beverage

## 2024-01-29 NOTE — Consult Note (Signed)
 Member faxed out per [facility]  Recipient Method Sent by Date [facility] Nechama Drucella BRAVO 01/29/2024  Fax: [phone] Phone: [phone]   [facility] Fax Nechama Drucella BRAVO 01/29/2024  Fax: [phone] Phone: [phone]   [facility] Fax [place], [place] E 01/29/2024  Fax: [phone] Phone: [phone]   [facility] [place], [place] E 01/29/2024  Fax: [phone] Phone: [phone]   [facility] [place], [place] E 01/29/2024  Fax: [phone] Phone: [phone]   [facility] Fax Nechama Drucella BRAVO 01/29/2024  Fax: [phone] Phone: [phone]   [facility] Fax Nechama Drucella BRAVO 01/29/2024  Fax: [phone] Phone: [phone]   [facility] [address] [place], [place] E 01/29/2024  Fax: [phone] Phone: [phone]   [facility]ax Nechama Drucella BRAVO 01/29/2024  Fax: [phone] Phone: [phone]   [facility] Fax Nechama Drucella BRAVO 01/29/2024  Fax: [phone] Phone: [phone]   [facility] Fax Nechama Drucella BRAVO 01/29/2024  Fax: [phone] Phone: [phone]   [facility] Patient Placement Fax Nechama Drucella BRAVO 01/29/2024  Fax: [phone] Phone: [phone]   [facility] [address] Nechama Drucella BRAVO 01/29/2024  Fax: [phone] Phone: [phone]

## 2024-01-29 NOTE — ED Notes (Signed)
 Pt provided with morning snack and water.

## 2024-01-29 NOTE — ED Provider Notes (Signed)
 Emergency Medicine Observation Re-evaluation Note  Derrick Atkins is a 53 y.o. male, seen on rounds today.  Pt initially presented to the ED for complaints of Suicidal  Currently, the patient is resting comfortably.  Physical Exam  BP (!) 144/75 (BP Location: Left Arm)   Pulse 78   Temp 98.1 F (36.7 C) (Oral)   Resp 18   Ht 6' 4 (1.93 m)   Wt 86 kg   SpO2 99%   BMI 23.08 kg/m  General: No acute distress Cardiac: Well-perfused extremities Lungs: No respiratory distress Psych: Appropriate mood and affect  ED Course / MDM  EKG:   I have reviewed the labs performed to date as well as medications administered while in observation.  Recent changes in the last 24 hours include none.  Plan  Current plan is for placement.   Jossie Artist POUR, MD 01/29/24 [phone]

## 2024-01-30 DIAGNOSIS — F32A Depression, unspecified: Secondary | ICD-10-CM | POA: Diagnosis not present

## 2024-01-30 LAB — URINE DRUG SCREEN, QUALITATIVE (ARMC ONLY)
Amphetamines, Ur Screen: NOT DETECTED
Barbiturates, Ur Screen: NOT DETECTED
Benzodiazepine, Ur Scrn: NOT DETECTED
Cannabinoid 50 Ng, Ur ~~LOC~~: NOT DETECTED
Cocaine Metabolite,Ur ~~LOC~~: NOT DETECTED
MDMA (Ecstasy)Ur Screen: NOT DETECTED
Methadone Scn, Ur: NOT DETECTED
Opiate, Ur Screen: NOT DETECTED
Phencyclidine (PCP) Ur S: NOT DETECTED
Tricyclic, Ur Screen: NOT DETECTED

## 2024-01-30 NOTE — ED Notes (Signed)
VOL/Pending Placement 

## 2024-01-30 NOTE — ED Notes (Signed)
 Assumed care of patient, pt awake and sitting in dayroom eating breakfast, no signs of distress noted

## 2024-01-30 NOTE — ED Notes (Signed)
 Hospital meal provided, pt tolerated w/o complaints.  Waste discarded appropriately.

## 2024-01-30 NOTE — ED Provider Notes (Signed)
 Emergency Medicine Observation Re-evaluation Note  Derrick Atkins is a 53 y.o. male, seen on rounds today.  Pt initially presented to the ED for complaints of Suicidal  Currently, the patient is walking around in the day room.  Physical Exam  BP 125/77 (BP Location: Right Arm)   Pulse 74   Temp 97.7 F (36.5 C) (Oral)   Resp 17   Ht 6' 4 (1.93 m)   Wt 86 kg   SpO2 100%   BMI 23.08 kg/m  Physical Exam General: No acute distress Cardiac: Well-perfused Lungs: Normal effort Psych: Calm and cooperative  ED Course / MDM   There have been no significant changes to the patient's clinical status in the last 24 hours.  Plan  Current plan is for placement.    Jacolyn Pae, MD 01/30/24 1428

## 2024-01-30 NOTE — ED Notes (Signed)
 IVC/Pending Placement

## 2024-01-31 DIAGNOSIS — F32A Depression, unspecified: Secondary | ICD-10-CM | POA: Diagnosis not present

## 2024-01-31 NOTE — ED Notes (Signed)
 Pt Given lunch at bedside

## 2024-01-31 NOTE — ED Notes (Signed)
 Pt breakfast provided at bedside

## 2024-01-31 NOTE — Progress Notes (Signed)
 Per TTS, patient has been referred to the following out of network facilities:   Service Provider Phone  [facility]  [phone]  [facility]  [phone]  [facility]  [phone]  [facility]  [phone]  [facility]  [phone]  [facility]  [phone]  [facility]  [phone]  [facility]  [phone]  [facility] [place]  [phone]  [facility] Patient Placement  [phone]  [facility]  [phone]  [facility]  [phone]  [facility]  [phone]  [facility]  [phone]  [facility]  [phone]  [facility]  [phone]  [facility]  [phone]  [facility]  [phone], [place] [phone]

## 2024-01-31 NOTE — ED Notes (Signed)
 Assumed care of patient, pt resting in bed in room with eyes shut, no distress noted, respirations even and unlabored

## 2024-01-31 NOTE — ED Provider Notes (Signed)
 Emergency Medicine Observation Re-evaluation Note  Derrick Atkins is a 53 y.o. male, seen on rounds today.  Pt initially presented to the ED for complaints of Suicidal  Currently, the patient is no acute distress.  Physical Exam  Blood pressure 130/66, pulse 68, temperature 97.6 F (36.4 C), temperature source Oral, resp. rate 16, height 6' 4 (1.93 m), weight 86 kg, SpO2 98%.  Physical Exam General: No apparent distress Pulm: Normal WOB Psych: resting     ED Course / MDM     I have reviewed the labs performed to date as well as medications administered while in observation.  Recent changes in the last 24 hours include none   Plan   Current plan is to continue to wait for psych plan/placement if felt warranted  Patient is not under full IVC at this time.   Ernest Ronal BRAVO, MD 01/31/24 1213

## 2024-01-31 NOTE — ED Notes (Signed)
VOl pending placement 

## 2024-01-31 NOTE — ED Notes (Signed)
Dinner meal provided for pt.

## 2024-02-01 DIAGNOSIS — F32A Depression, unspecified: Secondary | ICD-10-CM | POA: Diagnosis not present

## 2024-02-01 NOTE — ED Notes (Signed)
 Report given to Luke Gin, RN

## 2024-02-01 NOTE — ED Notes (Signed)
Snack was provided to pt.

## 2024-02-01 NOTE — ED Notes (Signed)
VOL  PENDING  PLACEMENT 

## 2024-02-01 NOTE — ED Provider Notes (Signed)
 Emergency Medicine Observation Re-evaluation Note  Derrick Atkins is a 53 y.o. male, seen on rounds today.  Pt initially presented to the ED for complaints of Suicidal  No events overnight  Physical Exam  BP (!) 142/74 (BP Location: Right Arm)   Pulse 72   Temp 97.6 F (36.4 C) (Oral)   Resp 18   Ht 1.93 m (6' 4)   Wt 86 kg   SpO2 95%   BMI 23.08 kg/m    ED Course / MDM    Plan   Plan is for inpatient psychiatric admission, team is working to find available room   Derrick Charleston, MD 02/01/24 [phone]

## 2024-02-01 NOTE — Progress Notes (Signed)
 Per TTS, re-faxed patient to the following out of network facilities:  Service Provider Phone  [facility]  [phone]  [facility]  [phone]  [facility]  [phone]  [facility]  [phone]  [facility]  [phone]  [facility]  [phone]  [facility]  [phone]  [facility] [place]  [phone]  [facility] Patient Placement  [phone]  [facility]  [phone]  [facility]  [phone]  [facility] [place]  [phone]  [facility]  [phone]  [facility]  [phone]  [facility]  [phone]  [facility]  [phone]  [facility]  [phone], [place] [phone]

## 2024-02-01 NOTE — ED Notes (Signed)
Pt asked for a cup of water. 

## 2024-02-02 DIAGNOSIS — F32A Depression, unspecified: Secondary | ICD-10-CM | POA: Diagnosis not present

## 2024-02-02 NOTE — ED Provider Notes (Signed)
 Emergency Medicine Observation Re-evaluation Note  Derrick Atkins is a 53 y.o. male, seen on rounds today.  Pt initially presented to the ED for complaints of Suicidal  Currently, the patient is no acute distress. NO issues per bhu nurse   Physical Exam  Blood pressure (!) 146/72, pulse 74, temperature 97.8 F (36.6 C), temperature source Oral, resp. rate 18, height 6' 4 (1.93 m), weight 86 kg, SpO2 97%.  Physical Exam General: No apparent distress Pulm: Normal WOB Psych: resting     ED Course / MDM     I have reviewed the labs performed to date as well as medications administered while in observation.  Recent changes in the last 24 hours include none   Plan   Current plan is to continue to wait for psych plan/placement if felt warranted  Patient is not under full IVC at this time.   Ernest Ronal BRAVO, MD 02/02/24 [phone]

## 2024-02-02 NOTE — ED Notes (Signed)
 Pt had a shower and then was given a snack.

## 2024-02-02 NOTE — ED Notes (Signed)
 Breakfast was provided to pt at bedside, Vitals where completed.

## 2024-02-02 NOTE — ED Notes (Signed)
 vol/pending placement.Marland Kitchen

## 2024-02-02 NOTE — Discharge Instructions (Signed)
 [place] by psych

## 2024-02-02 NOTE — ED Provider Notes (Signed)
 IVC reversed by psych.  Pt cleared for dc to group home.      Ernest Ronal BRAVO, MD 02/02/24 1120

## 2024-02-02 NOTE — ED Notes (Signed)
 Patient out and down in bed. Patient states he is hearing voices but they are not saying anything mean. Patient was frightened by other patient due to patient making loud noises and knocking of soft padded door off frame.

## 2024-02-02 NOTE — Progress Notes (Signed)
   02/02/24 1100  Spiritual Encounters  Type of Visit Initial  Care provided to: Patient  Conversation partners present during encounter Nurse  Reason for visit Routine spiritual support  OnCall Visit Yes   Chaplain visited with patient to offer spiritual care.  Patient was happy about being able to speak to his doctors, having meds regulated and that he was getting adequate rest. Chaplain celebrated these things that made the patient happy.  Patient said the music on the TV are only videos of secular music.  (Chaplain recalled from a previous admission that the patient likes [place].)  Patient mentioned possible discharge today.  Chaplain will check back later in the week to see if patient is still here and follow-up.    Rev. Rana M. Nicholaus, M.Div. Chaplain Resident [facility]

## 2024-02-12 ENCOUNTER — Emergency Department
Admission: EM | Admit: 2024-02-12 | Discharge: 2024-02-13 | Disposition: A | Discharge status: Home Health | Attending: Emergency Medicine | Admitting: Emergency Medicine

## 2024-02-12 ENCOUNTER — Other Ambulatory Visit: Payer: Self-pay

## 2024-02-12 DIAGNOSIS — I10 Essential (primary) hypertension: Secondary | ICD-10-CM | POA: Insufficient documentation

## 2024-02-12 DIAGNOSIS — Z79899 Other long term (current) drug therapy: Secondary | ICD-10-CM | POA: Insufficient documentation

## 2024-02-12 DIAGNOSIS — Z91040 Latex allergy status: Secondary | ICD-10-CM | POA: Insufficient documentation

## 2024-02-12 DIAGNOSIS — F32A Depression, unspecified: Secondary | ICD-10-CM | POA: Diagnosis present

## 2024-02-12 DIAGNOSIS — F209 Schizophrenia, unspecified: Secondary | ICD-10-CM | POA: Diagnosis not present

## 2024-02-12 DIAGNOSIS — E119 Type 2 diabetes mellitus without complications: Secondary | ICD-10-CM | POA: Diagnosis not present

## 2024-02-12 LAB — CBC
HCT: 38.7 % — ABNORMAL LOW (ref 39.0–52.0)
Hemoglobin: 13 g/dL (ref 13.0–17.0)
MCH: 27.7 pg (ref 26.0–34.0)
MCHC: 33.6 g/dL (ref 30.0–36.0)
MCV: 82.5 fL (ref 80.0–100.0)
Platelets: 148 K/uL — ABNORMAL LOW (ref 150–400)
RBC: 4.69 MIL/uL (ref 4.22–5.81)
RDW: 13.2 % (ref 11.5–15.5)
WBC: 6 K/uL (ref 4.0–10.5)
nRBC: 0 % (ref 0.0–0.2)

## 2024-02-12 LAB — ETHANOL: Alcohol, Ethyl (B): <15 mg/dL (ref <15)

## 2024-02-12 LAB — URINE DRUG SCREEN, QUALITATIVE (ARMC ONLY)
Amphetamines, Ur Screen: NOT DETECTED
Barbiturates, Ur Screen: NOT DETECTED
Benzodiazepine, Ur Scrn: NOT DETECTED
Cannabinoid 50 Ng, Ur ~~LOC~~: NOT DETECTED
Cocaine Metabolite,Ur ~~LOC~~: NOT DETECTED
MDMA (Ecstasy)Ur Screen: POSITIVE — AB
Methadone Scn, Ur: NOT DETECTED
Opiate, Ur Screen: NOT DETECTED
Phencyclidine (PCP) Ur S: NOT DETECTED
Tricyclic, Ur Screen: NOT DETECTED

## 2024-02-12 LAB — COMPREHENSIVE METABOLIC PANEL WITH GFR
ALT: 14 U/L (ref 0–44)
AST: 21 U/L (ref 15–41)
Albumin: 4.1 g/dL (ref 3.5–5.0)
Alkaline Phosphatase: 81 U/L (ref 38–126)
Anion gap: 12 (ref 5–15)
BUN: 14 mg/dL (ref 6–20)
CO2: 25 mmol/L (ref 22–32)
Calcium: 9.6 mg/dL (ref 8.9–10.3)
Chloride: 106 mmol/L (ref 98–111)
Creatinine, Ser: 0.85 mg/dL (ref 0.61–1.24)
GFR, Estimated: >60 mL/min (ref >60)
Glucose, Bld: 133 mg/dL — ABNORMAL HIGH (ref 70–99)
Potassium: 3.9 mmol/L (ref 3.5–5.1)
Sodium: 143 mmol/L (ref 135–145)
Total Bilirubin: 0.7 mg/dL (ref 0.0–1.2)
Total Protein: 6.8 g/dL (ref 6.5–8.1)

## 2024-02-12 MED ORDER — OLANZAPINE 5 MG PO TABS
5.0000 mg | ORAL_TABLET | Freq: Once | ORAL | Status: AC
  Administered 2024-02-12: 5 mg via ORAL
  Filled 2024-02-12: qty 1

## 2024-02-12 NOTE — ED Provider Notes (Signed)
 [facility] Provider Note    Event Date/Time   First MD Initiated Contact with Patient 02/12/24 [phone]     (approximate)   History   Psychiatric Evaluation   HPI  Derrick Atkins is a 53 y.o. male past medical history significant for schizophrenia, cerebral palsy, diabetes, hypertension, who presents to the emergency department with depression.  Patient states that he is tired of being at his group home and he does not want to be there any longer because he does not have anyone to talk to.  States that he does not have anywhere else to go.  Went to PD today and then came into the emergency department.  Denies any suicidal or homicidal ideation.  Does endorse ongoing auditory hallucinations.  Denies any drug or alcohol  use.  Requesting to talk to psychiatry.  States he feels depressed.     Physical Exam   Triage Vital Signs: ED Triage Vitals  Encounter Vitals Group     BP 02/12/24 0909 123/71     Girls Systolic BP Percentile --      Girls Diastolic BP Percentile --      Boys Systolic BP Percentile --      Boys Diastolic BP Percentile --      Pulse Rate 02/12/24 0909 76     Resp 02/12/24 0909 19     Temp 02/12/24 0909 98.3 F (36.8 C)     Temp src --      SpO2 02/12/24 0909 99 %     Weight 02/12/24 0910 189 lb 9.5 oz (86 kg)     Height 02/12/24 0910 6' 4 (1.93 m)     Head Circumference --      Peak Flow --      Pain Score 02/12/24 0910 0     Pain Loc --      Pain Education --      Exclude from Growth Chart --     Most recent vital signs: Vitals:   02/12/24 0909  BP: 123/71  Pulse: 76  Resp: 19  Temp: 98.3 F (36.8 C)  SpO2: 99%    Physical Exam Constitutional:      Appearance: He is well-developed.  HENT:     Head: Atraumatic.  Eyes:     Conjunctiva/sclera: Conjunctivae normal.  Cardiovascular:     Rate and Rhythm: Regular rhythm.  Pulmonary:     Effort: No respiratory distress.  Musculoskeletal:     Cervical back: Normal  range of motion.  Skin:    General: Skin is warm.  Neurological:     Mental Status: He is alert. Mental status is at baseline.  Psychiatric:        Attention and Perception: Attention normal.        Mood and Affect: Mood is depressed.        Speech: Speech normal.        Behavior: Behavior normal. Behavior is cooperative.        Thought Content: Thought content does not include homicidal or suicidal ideation. Thought content does not include homicidal or suicidal plan.     IMPRESSION / MDM / ASSESSMENT AND PLAN / ED COURSE  I reviewed the triage vital signs and the nursing notes.  Differential diagnosis including depression, medication side effect, electrolyte abnormality  LABS (all labs ordered are listed, but only abnormal results are displayed) Labs interpreted as -    Labs Reviewed  COMPREHENSIVE METABOLIC PANEL WITH GFR - Abnormal; Notable  for the following components:      Result Value   Glucose, Bld 133 (*)    All other components within normal limits  CBC - Abnormal; Notable for the following components:   HCT 38.7 (*)    Platelets 148 (*)    All other components within normal limits  ETHANOL  URINE DRUG SCREEN, QUALITATIVE ([facility] ONLY)    MDM  Patient presents to the emergency department with thoughts of depression.  Here voluntarily and asking to talk to psychiatry.  Comes from a group home.  Plan for evaluation by psychiatry  Lab work reassuring with no significant electrolyte abnormality.  Alcohol  level negative..    The patient has been placed in psychiatric observation due to the need to provide a safe environment for the patient while obtaining psychiatric consultation and evaluation, as well as ongoing medical and medication management to treat the patient's condition.  The patient has not been placed under full IVC at this time.   PROCEDURES:  Critical Care performed: No  Procedures  Patient's presentation is most consistent with acute presentation  with potential threat to life or bodily function.   MEDICATIONS ORDERED IN ED: Medications - No data to display  FINAL CLINICAL IMPRESSION(S) / ED DIAGNOSES   Final diagnoses:  Depression, unspecified depression type     Rx / DC Orders   ED Discharge Orders     None        Note:  This document was prepared using Dragon voice recognition software and may include unintentional dictation errors.   Suzanne Kirsch, MD 02/12/24 1014

## 2024-02-12 NOTE — ED Triage Notes (Addendum)
 Pt comes in via POV voluntarily for a psych evaluation. Pt is currently living in a group home, and is unable to sign himself out or leave alone. Pt walked up to the police department requesting assistance. Pt told law enforcement that he has been going thru some things he would like help with. Pt complains of back pain 8/10.  Pt has a history of scoliosis. Pt calm and cooperative in triage.    Derrick Atkins [phone] [address] [place], [place] Group home coordinator is requesting an update on pt, and will be the main point of contact.   Pt dressed out by this RN and EDT Kat  Pt Belongings: White socks White shirt Underwear [employer] of shoes

## 2024-02-12 NOTE — ED Notes (Signed)
 Pt came out of room asking for lunch. This RN told pt she would bring it once it arrived. Beverage was provided per request. NAD. Pt redirected to room.

## 2024-02-12 NOTE — ED Notes (Signed)
 Dinner tray given to pt

## 2024-02-12 NOTE — ED Notes (Signed)
 Pt given snack at this time. Pt calm and cooperative.

## 2024-02-12 NOTE — ED Notes (Signed)
 Pt called out due to hearing voices stating he is not the illuminator. Pt endorses R sided stiffness that come when he is tired. Pt stated he has slept all night. There is bilateral eye twitching below the eyes. NAD. Pt stated that nothing helps with the stiffness but to ride it out

## 2024-02-12 NOTE — Consult Note (Signed)
 [facility] Psychiatric Consult Initial  Patient Name: .Derrick Atkins  MRN: [id number]  DOB: 1971-05-31  Consult Order details:  Orders (From admission, onward)     Start     Ordered   02/12/24 1012  CONSULT TO CALL ACT TEAM       Ordering Provider: Suzanne Kirsch, MD  Provider:  (Not yet assigned)  Question:  Reason for Consult?  Answer:  Psych consult   02/12/24 1011   02/12/24 1012  IP CONSULT TO PSYCHIATRY       Ordering Provider: Suzanne Kirsch, MD  Provider:  (Not yet assigned)  Question Answer Comment  Consult Timeframe URGENT - requires response within 12 hours   URGENT timeframe requires provider to provider communication, has the provider to provider communication been completed Yes   Reason for Consult? Consult for medication management   Contact phone number where the requesting provider can be reached [phone]      02/12/24 1011             Mode of Visit: In person    Psychiatry Consult Evaluation  Service Date: February 12, 2024 LOS:  LOS: 0 days  Chief Complaint I feel alone, I don't have anyone to talk to  Primary Psychiatric Diagnoses  schiozophrenia   Assessment  Derrick Atkins is a 53 y.o. male admitted: Presented to the ED   EDP Note: Derrick Atkins is a 54 y.o. male past medical history significant for schizophrenia, cerebral palsy, diabetes, hypertension, who presents to the emergency department with depression.  Patient states that he is tired of being at his group home and he does not want to be there any longer because he does not have anyone to talk to.  States that he does not have anywhere else to go.  Went to PD today and then came into the emergency department.  Denies any suicidal or homicidal ideation.  Does endorse ongoing auditory hallucinations.  Denies any drug or alcohol  use.  Requesting to talk to psychiatry.  States he feels depressed.   Consult note: Patient is a 53 year old male who is well known to the emergency room with  similar presentation. Today, he again presented with depressed mood. He explains that his social worker Glendale Byes) is supposed to be working with him to get him more snacks and has not done so. He is somewhat food focused today as he explains that he has been in previous [facility] which he feels as though he was being starved and currently there is no snack schedule. He also states that his back pain is problematic. Over the past few days, he report increase in frequency of auditory hallucinations that has increased his anxiety. He states that hallucinations are running commentary regarding various things. He denies command hallucinations. Patient has chronic passive thoughts of self harm and today denies any thoughts. He is able to display future oriented goals of having more friends within the group home.   Diagnoses:  Active Hospital problems: Active Problems:   * No active hospital problems. *    Plan   ## Psychiatric Medication Recommendations:  Continue home medications and will re-evaluate in 24 hours.   ## Medical Decision Making Capacity: Patient has a guardian and has thus been adjudicated incompetent; please involve patients guardian in medical decision making  ## Further Work-up:   -- most recent EKG on 12/18/2023 had QtC of 431 -- Pertinent labwork reviewed earlier this admission includes: CBC, CMP unremarkable   ##  Disposition:-- There are no psychiatric contraindications to discharge at this time  ## Behavioral / Environmental: -Utilize compassion and acknowledge the patient's experiences while setting clear and realistic expectations for care.    ## Safety and Observation Level:  - Based on my clinical evaluation, I estimate the patient to be at low risk of self harm in the current setting. - At this time, we recommend  routine. This decision is based on my review of the chart including patient's history and current presentation, interview of the patient, mental status  examination, and consideration of suicide risk including evaluating suicidal ideation, plan, intent, suicidal or self-harm behaviors, risk factors, and protective factors. This judgment is based on our ability to directly address suicide risk, implement suicide prevention strategies, and develop a safety plan while the patient is in the clinical setting. Please contact our team if there is a concern that risk level has changed.  CSSR Risk Category:C-SSRS RISK CATEGORY: No Risk  Suicide Risk Assessment: Patient has following modifiable risk factors for suicide: social isolation, which we are addressing by encouraging outpatient day programs or PHP programs. Patient has following non-modifiable or demographic risk factors for suicide: male gender and psychiatric hospitalization Patient has the following protective factors against suicide: Access to outpatient mental health care  Thank you for this consult request. Recommendations have been communicated to the primary team.  We will follow up in 24 hours to reassess at this time.   Jerricka Carvey B Kaleth Koy, NP       History of Present Illness  Relevant Aspects of Hospital ED   Patient Report:  Patient is a 53 year old male who is well known to the emergency room with similar presentation. Today, he again presented with depressed mood. He explains that his social worker Glendale Byes) is supposed to be working with him to get him more snacks and has not done so. He is somewhat food focused today as he explains that he has been in previous [facility] which he feels as though he was being starved and currently there is no snack schedule. He also states that his back pain is problematic. Over the past few days, he report increase in frequency of auditory hallucinations that has increased his anxiety. He states that hallucinations are running commentary regarding various things. He denies command hallucinations. Patient has chronic passive thoughts of self harm and  today denies any thoughts. He is able to display future oriented goals of having more friends within the group home.   Psych ROS:  Depression: endorses  Anxiety:  endorses  Mania (lifetime and current): history of Psychosis: (lifetime and current): history of schizophrenia  Collateral information:  Unavailable. Attempted to contact [name] DSS guardian. Left message to contact Behavioral services.     Psychiatric and Social History  Psychiatric History:  Information collected from patient and chart  Prev Dx/Sx: schizophrenia Current Psych Provider: unknown Home Meds (current): olanzapine , abilify  maintena, benztropine , lamotrigine , buspirone , haloperidol  Previous Med Trials: unknown Therapy: unknown  Prior Psych Hospitalization: [facility]   Prior Self Harm: unknown Prior Violence: unknown  Family Psych History: unknown Family Hx suicide: unknown  Social History:   Educational Hx: unknown Occupational Hx: disabled Legal Hx: unknown Living Situation: [facility] Spiritual Hx: unknown Access to weapons/lethal means: denies   Substance History Alcohol : unknown  Type of alcohol  unknown Last Drink unknown Number of drinks per day unknown History of alcohol  withdrawal seizures unknown History of DT's unknown Tobacco: unknown Illicit drugs: unknown Prescription drug abuse: unknown  Rehab hx: unknown  Exam Findings  Physical Exam: I have reviewed and agree with initial provider exam Vital Signs:  Temp:  [98.3 F (36.8 C)] 98.3 F (36.8 C) (08/03 0909) Pulse Rate:  [76] 76 (08/03 0909) Resp:  [19] 19 (08/03 0909) BP: (123)/(71) 123/71 (08/03 0909) SpO2:  [99 %] 99 % (08/03 0909) Weight:  [86 kg] 86 kg (08/03 0910) Blood pressure 123/71, pulse 76, temperature 98.3 F (36.8 C), resp. rate 19, height 6' 4 (1.93 m), weight 86 kg, SpO2 99%. Body mass index is 23.08 kg/m.    Mental Status Exam: General Appearance: Fairly Groomed  Orientation:  Full (Time, Place, and  Person)  Memory:  Immediate;   Good Recent;   Good Remote;   Good  Concentration:  Concentration: Fair and Attention Span: Fair  Recall:  Fair  Attention  Fair  Eye Contact:  Minimal  Speech:  Clear and Coherent  Language:  Good  Volume:  Normal  Mood: anxious  Affect:  Appropriate and depressed  Thought Process:  Coherent  Thought Content:  WDL  Suicidal Thoughts:  No  Homicidal Thoughts:  No  Judgement:  Impaired  Insight:  Fair  Psychomotor Activity:  Normal  Akathisia:  No  Fund of Knowledge:  fair      Assets:  Housing Social Support  Cognition:  Impaired,  Mild  ADL's:  Intact  AIMS (if indicated):        Other History   These have been pulled in through the EMR, reviewed, and updated if appropriate.  Family History:  The patient's family history includes Heart attack in his father; Heart disease in his father.  Medical History: Past Medical History:  Diagnosis Date   Cerebral palsy (HCC)    Depression    Diabetes mellitus without complication (HCC)    Hypertension    Kidney stones    Schizoaffective disorder (HCC)    Scoliosis     Surgical History: Past Surgical History:  Procedure Laterality Date   BOWEL RESECTION     CATARACT EXTRACTION W/PHACO Right 09/15/2022   Procedure: CATARACT EXTRACTION PHACO AND INTRAOCULAR LENS PLACEMENT (IOC) RIGHT DIABETIC VISION BLUE HEALON 5  15.79  01:31.9;  Surgeon: Mittie Gaskin, MD;  Location: [facility];  Service: Ophthalmology;  Laterality: Right;  Latex Diabetic   INCISION AND DRAINAGE ABSCESS Left 12/20/2022   Procedure: INCISION AND DRAINAGE ABSCESS;  Surgeon: Lane Shope, MD;  Location: [facility];  Service: General;  Laterality: Left;   LAPAROTOMY N/A 05/15/2020   Procedure: EXPLORATORY LAPAROTOMY;  Surgeon: Desiderio Schanz, MD;  Location: [facility];  Service: General;  Laterality: N/A;     Medications:  No current facility-administered medications for this encounter.  Current Outpatient  Medications:    ABILIFY  MAINTENA 400 MG PRSY prefilled syringe, Inject 400 mg into the muscle every 28 (twenty-eight) days., Disp: , Rfl:    albuterol  (VENTOLIN  HFA) 108 (90 Base) MCG/ACT inhaler, Inhale 2 puffs into the lungs every 4 (four) hours as needed for shortness of breath or wheezing., Disp: 17 each, Rfl: 0   ascorbic acid  (VITAMIN C ) 500 MG tablet, Take 1 tablet (500 mg total) by mouth daily., Disp: 30 tablet, Rfl: 3   aspirin  EC 81 MG tablet, Take 1 tablet (81 mg total) by mouth daily. Swallow whole., Disp: 30 tablet, Rfl: 12   benztropine  (COGENTIN ) 1 MG tablet, Take 1 tablet (1 mg total) by mouth daily., Disp: 30 tablet, Rfl: 0   cholecalciferol  (VITAMIN D3) 25 MCG (  1000 UNIT) tablet, Take 1 tablet (1,000 Units total) by mouth daily., Disp: 30 tablet, Rfl: 0   cyanocobalamin  (VITAMIN B12) 1000 MCG tablet, Take 1 tablet (1,000 mcg total) by mouth daily., Disp: 30 tablet, Rfl: 0   fenofibrate  54 MG tablet, Take 1 tablet (54 mg total) by mouth daily., Disp: 30 tablet, Rfl: 0   FLUoxetine  (PROZAC ) 20 MG capsule, Take 1 capsule (20 mg total) by mouth daily., Disp: 30 capsule, Rfl: 0   folic acid  (FOLVITE ) 1 MG tablet, Take 1 tablet (1 mg total) by mouth daily., Disp: 30 tablet, Rfl: 0   iron  polysaccharides (NIFEREX) 150 MG capsule, Take 1 capsule (150 mg total) by mouth daily., Disp: 30 capsule, Rfl: 0   levothyroxine  (SYNTHROID ) 88 MCG tablet, Take 1 tablet (88 mcg total) by mouth daily at 6 (six) AM., Disp: 30 tablet, Rfl: 0   LINZESS  290 MCG CAPS capsule, Take 1 capsule (290 mcg total) by mouth daily., Disp: 30 capsule, Rfl: 0   metoprolol  tartrate (LOPRESSOR ) 25 MG tablet, Take 1 tablet (25 mg total) by mouth 2 (two) times daily., Disp: 30 tablet, Rfl: 0   OLANZapine  (ZYPREXA ) 15 MG tablet, Take 1 tablet (15 mg total) by mouth at bedtime., Disp: 30 tablet, Rfl: 0   pantoprazole  (PROTONIX ) 40 MG tablet, Take 1 tablet (40 mg total) by mouth daily before breakfast., Disp: 30 tablet, Rfl:  0   potassium chloride  SA (KLOR-CON  M) 20 MEQ tablet, Take 1 tablet (20 mEq total) by mouth daily., Disp: 30 tablet, Rfl: 0   rosuvastatin  (CRESTOR ) 5 MG tablet, Take 1 tablet (5 mg total) by mouth daily., Disp: 30 tablet, Rfl: 0   senna (SENOKOT) 8.6 MG TABS tablet, Take 2 tablets by mouth at bedtime., Disp: , Rfl:    traZODone  (DESYREL ) 50 MG tablet, Take 1 tablet (50 mg total) by mouth at bedtime., Disp: 30 tablet, Rfl: 0  Allergies: Allergies  Allergen Reactions   Valproic Acid  And Related Other (See Comments)    Hyperammonemic encephalopathy   Phenytoin Sodium Extended Other (See Comments) and Nausea And Vomiting   Prednisone Hives and Nausea And Vomiting   Latex Hives, Nausea And Vomiting and Rash    Daine KATHEE Ober, NP

## 2024-02-12 NOTE — ED Notes (Signed)
 Pt called out due to increased paranoid thoughts. Pt is expressing that he wants help it just becomes overwhelming MD aware of pt increased anxiety. See MAR fro intervention.

## 2024-02-12 NOTE — BH Assessment (Signed)
 Comprehensive Clinical Assessment (CCA) Screening, Triage and Referral Note  02/12/2024 Derrick Atkins [id number]  Chief Complaint:  Chief Complaint  Patient presents with   Psychiatric Evaluation   Visit Diagnosis: Schizophrenia  Patient came to the ER due to anxiety and frustration with his guardian. Patient states he's upset with the guardian for not getting him more snacks and the group home staff won't give him extra food. He also reports an increase in the frequency of A/H. He states they are telling him to go steal snacks. Patient denies SI/HI and V/H.  Patient Reported Information How did you hear about us ? Self  What Is the Reason for Your Visit/Call Today? Patient came to the ER due to anxiety and frustration with his guardian.  How Long Has This Been Causing You Problems? 1 wk - 1 month  What Do You Feel Would Help You the Most Today? Treatment for Depression or other mood problem   Have You Recently Had Any Thoughts About Hurting Yourself? No  Are You Planning to Commit Suicide/Harm Yourself At This time? No   Have you Recently Had Thoughts About Hurting Someone [name]? No  Are You Planning to Harm Someone at This Time? No  Explanation: Pt endorse vague SI.   Have You Used Any Alcohol  or Drugs in the Past 24 Hours? Yes  How Long Ago Did You Use Drugs or Alcohol ? No data recorded What Did You Use and How Much? No data recorded  Do You Currently Have a Therapist/Psychiatrist? No  Name of Therapist/Psychiatrist: Via the care home   Have You Been Recently Discharged From Any Office Practice or Programs? No  Explanation of Discharge From Practice/Program: No data recorded   CCA Screening Triage Referral Assessment Type of Contact: Face-to-Face  Telemedicine Service Delivery:   Is this Initial or Reassessment?   Date Telepsych consult ordered in [facility]:    Time Telepsych consult ordered in [facility]:    Location of Assessment[facility] ED  Provider Location: [facility]  ED    Collateral Involvement: None   Does Patient Have a Court Appointed Legal Guardian? No data recorded Name and Contact of Legal Guardian: No data recorded If Minor and Not Living with Parent(s), Who has Custody? n/a  Is CPS involved or ever been involved? Never  Is APS involved or ever been involved? Currently   Patient Determined To Be At Risk for Harm To Self or Others Based on Review of Patient Reported Information or Presenting Complaint? No  Method: No Plan  Availability of Means: No access or NA  Intent: Clearly intends on inflicting harm that could cause death  Notification Required: No need or identified person  Additional Information for Danger to Others Potential: Active psychosis  Additional Comments for Danger to Others Potential: n/a  Are There Guns or Other Weapons in Your Home? No  Types of Guns/Weapons: n/a  Are These Weapons Safely Secured?                            No  Who Could Verify You Are Able To Have These Secured: n/a  Do You Have any Outstanding Charges, Pending Court Dates, Parole/Probation? None reoprted  Contacted To Inform of Risk of Harm To Self or Others: Unable to Contact:   Does Patient Present under Involuntary Commitment? No   [place] of Residence: [place]   Patient Currently Receiving the Following Services: Medication Management   Determination of Need: Emergent (2 hours)  Options For Referral: Other: Comment   Disposition Recommendation per psychiatric provider: Observe overnight and reasses tomorwo (02/13/2024)   Kiki DOROTHA Barge MS, LCAS, [facility], [facility] Therapeutic Triage Specialist 02/12/2024 3:26 PM

## 2024-02-13 DIAGNOSIS — F32A Depression, unspecified: Secondary | ICD-10-CM | POA: Diagnosis not present

## 2024-02-13 MED ORDER — TRAZODONE HCL 50 MG PO TABS
150.0000 mg | ORAL_TABLET | Freq: Every evening | ORAL | Status: DC | PRN
  Administered 2024-02-13: 150 mg via ORAL
  Filled 2024-02-13: qty 1

## 2024-02-13 MED ORDER — METOPROLOL TARTRATE 25 MG PO TABS
25.0000 mg | ORAL_TABLET | Freq: Two times a day (BID) | ORAL | Status: DC
  Administered 2024-02-13: 25 mg via ORAL
  Filled 2024-02-13: qty 1

## 2024-02-13 MED ORDER — TRAZODONE HCL 50 MG PO TABS: 50.0000 mg | ORAL_TABLET | Freq: Every day | ORAL | Status: DC

## 2024-02-13 MED ORDER — FLUOXETINE HCL 20 MG PO CAPS
20.0000 mg | ORAL_CAPSULE | Freq: Every day | ORAL | Status: DC
  Administered 2024-02-13: 20 mg via ORAL
  Filled 2024-02-13: qty 1

## 2024-02-13 MED ORDER — BENZTROPINE MESYLATE 1 MG PO TABS
1.0000 mg | ORAL_TABLET | Freq: Every day | ORAL | Status: DC
  Administered 2024-02-13: 1 mg via ORAL
  Filled 2024-02-13: qty 1

## 2024-02-13 MED ORDER — FENOFIBRATE 54 MG PO TABS
54.0000 mg | ORAL_TABLET | Freq: Every day | ORAL | Status: DC
  Administered 2024-02-13: 54 mg via ORAL
  Filled 2024-02-13: qty 1

## 2024-02-13 MED ORDER — OLANZAPINE 5 MG PO TABS: 15.0000 mg | ORAL_TABLET | Freq: Every day | ORAL | Status: DC

## 2024-02-13 MED ORDER — LEVOTHYROXINE SODIUM 88 MCG PO TABS
88.0000 ug | ORAL_TABLET | Freq: Every day | ORAL | Status: DC
  Administered 2024-02-13: 88 ug via ORAL
  Filled 2024-02-13: qty 1

## 2024-02-13 MED ORDER — OLANZAPINE 5 MG PO TBDP
10.0000 mg | ORAL_TABLET | Freq: Once | ORAL | Status: AC
  Administered 2024-02-13: 10 mg via ORAL
  Filled 2024-02-13: qty 2

## 2024-02-13 MED ORDER — FOLIC ACID 1 MG PO TABS
1.0000 mg | ORAL_TABLET | Freq: Every day | ORAL | Status: DC
  Administered 2024-02-13: 1 mg via ORAL
  Filled 2024-02-13: qty 1

## 2024-02-13 NOTE — ED Provider Notes (Signed)
 Emergency Medicine Observation Re-evaluation Note  GUISEPPE FLANAGAN is a 53 y.o. male, seen on rounds today.  Pt initially presented to the ED for complaints of Psychiatric Evaluation  Currently, the patient is is no acute distress. Denies any concerns at this time.  Physical Exam  Blood pressure 136/61, pulse (!) 57, temperature 97.8 F (36.6 C), resp. rate 17, height 6' 4 (1.93 m), weight 86 kg, SpO2 100%.  Physical Exam: General: No apparent distress Pulm: Normal WOB Neuro: Moving all extremities Psych: Resting comfortably     ED Course / MDM     I have reviewed the labs performed to date as well as medications administered while in observation.  Recent changes in the last 24 hours include: No acute events overnight.  Plan   Current plan: Patient awaiting psychiatric disposition.    Aquanetta Schwarz, Josette SAILOR, DO 02/13/24 [phone]

## 2024-02-13 NOTE — ED Notes (Signed)
Pt was provided with a lunch tray.

## 2024-02-13 NOTE — ED Notes (Signed)
 Pt refused vitals d/t anxiety/mood

## 2024-02-13 NOTE — ED Notes (Signed)
 Pt given shower supplies and is showering at this time

## 2024-02-13 NOTE — ED Notes (Signed)
 PT anxious at the moment.

## 2024-02-13 NOTE — Progress Notes (Addendum)
 Per Dr. Jadapalle, patient is psych cleared for discharge back to group home today.  [facility] contacted Rosana [phone]) at [facility] and left a HIPAA compliant message for a call back.  UPDATE (1:27PM):  Teacher, adult education) called back and stated he is having issues with patient continuously wandering away from group home.  He states he will give DSS a call regarding the elopement concerns and will call back with an update on patient's return to group home.  3:30PM:  [facility] received a call from [place] stating that he has picked up patient but wanted to give an update.  Rosana states he has been working with DSS in an effort to find another placement for patient.  Patient has attempted elopement from now 3 facilities including current group home.  Rosana expressed to DSS in July that patient is a risk to himself and need to be in a locked facility.  Rosana states he has been in contact with Lonell at [facility] since 7/18 and was asked to keep patient for 30 days until DSS can find him placement at a locked facility.  Rosana states he advised DSS today that if patient decides to elope again, he would have no choice but to discharge immediately for the safety of the patient.   [place], [place] [phone]

## 2024-02-13 NOTE — ED Notes (Signed)
 This RN called Group home Rosana Later at [phone] for handoff and to come [/u Pt as he was d/c. Group home owner stated he was contacting DSS for f/u as Pt is an elopement risk. I asked if he could still p/u Pt while he is sorting out DSS concern as Pt d/c. Group home owner stated he will call us  back w/update. LCSW [employer] notified.

## 2024-02-13 NOTE — ED Provider Notes (Signed)
 Patient seen and evaluated by psychiatry, Dr. Donnelly, recommends return to group home and outpatient management.  Will discharge with return precautions   Claudene Rover, MD 02/13/24 1323

## 2024-02-13 NOTE — ED Notes (Signed)
 vol/no psychiatric contraindications to discharge at this time/reassess pending.

## 2024-02-13 NOTE — ED Notes (Signed)
 Breakfast tray given to pt

## 2024-02-14 ENCOUNTER — Other Ambulatory Visit: Payer: Self-pay

## 2024-02-14 ENCOUNTER — Emergency Department: Admission: EM | Admit: 2024-02-14 | Discharge: 2024-02-15 | Disposition: A | Discharge status: Home / Self Care

## 2024-02-14 DIAGNOSIS — G809 Cerebral palsy, unspecified: Secondary | ICD-10-CM | POA: Diagnosis not present

## 2024-02-14 DIAGNOSIS — I10 Essential (primary) hypertension: Secondary | ICD-10-CM | POA: Insufficient documentation

## 2024-02-14 DIAGNOSIS — G9340 Encephalopathy, unspecified: Secondary | ICD-10-CM | POA: Diagnosis not present

## 2024-02-14 DIAGNOSIS — F329 Major depressive disorder, single episode, unspecified: Secondary | ICD-10-CM | POA: Insufficient documentation

## 2024-02-14 DIAGNOSIS — F419 Anxiety disorder, unspecified: Secondary | ICD-10-CM | POA: Insufficient documentation

## 2024-02-14 DIAGNOSIS — F259 Schizoaffective disorder, unspecified: Secondary | ICD-10-CM | POA: Diagnosis not present

## 2024-02-14 DIAGNOSIS — E119 Type 2 diabetes mellitus without complications: Secondary | ICD-10-CM | POA: Diagnosis not present

## 2024-02-14 DIAGNOSIS — R4701 Aphasia: Secondary | ICD-10-CM

## 2024-02-14 DIAGNOSIS — F940 Selective mutism: Secondary | ICD-10-CM | POA: Insufficient documentation

## 2024-02-14 DIAGNOSIS — F32A Depression, unspecified: Secondary | ICD-10-CM | POA: Diagnosis not present

## 2024-02-14 DIAGNOSIS — M419 Scoliosis, unspecified: Secondary | ICD-10-CM | POA: Diagnosis not present

## 2024-02-14 LAB — COMPREHENSIVE METABOLIC PANEL WITH GFR
ALT: 15 U/L (ref 0–44)
AST: 27 U/L (ref 15–41)
Albumin: 4.4 g/dL (ref 3.5–5.0)
Alkaline Phosphatase: 97 U/L (ref 38–126)
Anion gap: 13 (ref 5–15)
BUN: 16 mg/dL (ref 6–20)
CO2: 20 mmol/L — ABNORMAL LOW (ref 22–32)
Calcium: 9.5 mg/dL (ref 8.9–10.3)
Chloride: 106 mmol/L (ref 98–111)
Creatinine, Ser: 0.81 mg/dL (ref 0.61–1.24)
GFR, Estimated: >60 mL/min (ref >60)
Glucose, Bld: 163 mg/dL — ABNORMAL HIGH (ref 70–99)
Potassium: 4.3 mmol/L (ref 3.5–5.1)
Sodium: 139 mmol/L (ref 135–145)
Total Bilirubin: 0.9 mg/dL (ref 0.0–1.2)
Total Protein: 7.4 g/dL (ref 6.5–8.1)

## 2024-02-14 LAB — CBC
HCT: 36.3 % — ABNORMAL LOW (ref 39.0–52.0)
Hemoglobin: 12.2 g/dL — ABNORMAL LOW (ref 13.0–17.0)
MCH: 28 pg (ref 26.0–34.0)
MCHC: 33.6 g/dL (ref 30.0–36.0)
MCV: 83.3 fL (ref 80.0–100.0)
Platelets: 130 K/uL — ABNORMAL LOW (ref 150–400)
RBC: 4.36 MIL/uL (ref 4.22–5.81)
RDW: 13.2 % (ref 11.5–15.5)
WBC: 6.3 K/uL (ref 4.0–10.5)
nRBC: 0 % (ref 0.0–0.2)

## 2024-02-14 LAB — SALICYLATE LEVEL: Salicylate Lvl: <7 mg/dL — ABNORMAL LOW (ref 7.0–30.0)

## 2024-02-14 LAB — ACETAMINOPHEN LEVEL: Acetaminophen (Tylenol), Serum: <10 ug/mL — ABNORMAL LOW (ref 10–30)

## 2024-02-14 LAB — ETHANOL: Alcohol, Ethyl (B): <15 mg/dL (ref <15)

## 2024-02-14 MED ORDER — VITAMIN C 500 MG PO TABS
500.0000 mg | ORAL_TABLET | Freq: Every day | ORAL | Status: DC
  Administered 2024-02-15: 500 mg via ORAL
  Filled 2024-02-14: qty 1

## 2024-02-14 MED ORDER — LINACLOTIDE 145 MCG PO CAPS
290.0000 ug | ORAL_CAPSULE | Freq: Every day | ORAL | Status: DC
  Administered 2024-02-15: 290 ug via ORAL
  Filled 2024-02-14: qty 1
  Filled 2024-02-14: qty 2

## 2024-02-14 MED ORDER — SENNA 8.6 MG PO TABS
2.0000 | ORAL_TABLET | Freq: Every day | ORAL | Status: DC
  Administered 2024-02-14: 17.2 mg via ORAL
  Filled 2024-02-14: qty 2

## 2024-02-14 MED ORDER — VITAMIN D 25 MCG (1000 UNIT) PO TABS
1000.0000 [IU] | ORAL_TABLET | Freq: Every day | ORAL | Status: DC
  Administered 2024-02-15: 1000 [IU] via ORAL
  Filled 2024-02-14: qty 1

## 2024-02-14 MED ORDER — POLYSACCHARIDE IRON COMPLEX 150 MG PO CAPS
150.0000 mg | ORAL_CAPSULE | Freq: Every day | ORAL | Status: DC
  Administered 2024-02-15: 150 mg via ORAL
  Filled 2024-02-14: qty 1

## 2024-02-14 MED ORDER — METOPROLOL TARTRATE 50 MG PO TABS
25.0000 mg | ORAL_TABLET | Freq: Two times a day (BID) | ORAL | Status: DC
  Administered 2024-02-14 – 2024-02-15 (×2): 25 mg via ORAL
  Filled 2024-02-14: qty 1
  Filled 2024-02-14: qty 0.5

## 2024-02-14 MED ORDER — ROSUVASTATIN CALCIUM 5 MG PO TABS
5.0000 mg | ORAL_TABLET | Freq: Every day | ORAL | Status: DC
  Administered 2024-02-15: 5 mg via ORAL
  Filled 2024-02-14: qty 1

## 2024-02-14 MED ORDER — TRAZODONE HCL 50 MG PO TABS
50.0000 mg | ORAL_TABLET | Freq: Every day | ORAL | Status: DC
  Administered 2024-02-14: 50 mg via ORAL
  Filled 2024-02-14: qty 1

## 2024-02-14 MED ORDER — PANTOPRAZOLE SODIUM 40 MG PO TBEC
40.0000 mg | DELAYED_RELEASE_TABLET | Freq: Every day | ORAL | Status: DC
  Administered 2024-02-15: 40 mg via ORAL
  Filled 2024-02-14: qty 1

## 2024-02-14 MED ORDER — FENOFIBRATE 54 MG PO TABS
54.0000 mg | ORAL_TABLET | Freq: Every day | ORAL | Status: DC
  Administered 2024-02-15: 54 mg via ORAL
  Filled 2024-02-14: qty 1

## 2024-02-14 MED ORDER — ALBUTEROL SULFATE HFA 108 (90 BASE) MCG/ACT IN AERS: 2.0000 | INHALATION_SPRAY | RESPIRATORY_TRACT | Status: DC | PRN

## 2024-02-14 MED ORDER — ASPIRIN 81 MG PO TBEC
81.0000 mg | DELAYED_RELEASE_TABLET | Freq: Every day | ORAL | Status: DC
  Administered 2024-02-15: 81 mg via ORAL
  Filled 2024-02-14: qty 1

## 2024-02-14 MED ORDER — VITAMIN B-12 1000 MCG PO TABS
1000.0000 ug | ORAL_TABLET | Freq: Every day | ORAL | Status: DC
  Administered 2024-02-15: 1000 ug via ORAL
  Filled 2024-02-14: qty 1

## 2024-02-14 MED ORDER — OLANZAPINE 5 MG PO TABS
15.0000 mg | ORAL_TABLET | Freq: Every day | ORAL | Status: DC
  Administered 2024-02-14: 15 mg via ORAL
  Filled 2024-02-14: qty 1

## 2024-02-14 MED ORDER — LEVOTHYROXINE SODIUM 88 MCG PO TABS
88.0000 ug | ORAL_TABLET | Freq: Every day | ORAL | Status: DC
  Administered 2024-02-15: 88 ug via ORAL
  Filled 2024-02-14 (×2): qty 1

## 2024-02-14 MED ORDER — BENZTROPINE MESYLATE 1 MG PO TABS
1.0000 mg | ORAL_TABLET | Freq: Every day | ORAL | Status: DC
  Administered 2024-02-15: 1 mg via ORAL
  Filled 2024-02-14: qty 1

## 2024-02-14 NOTE — ED Notes (Signed)
 Assumed care of patient, pt sitting on side of bed, no distress noted.

## 2024-02-14 NOTE — ED Provider Notes (Signed)
 [facility] Provider Note    Event Date/Time   First MD Initiated Contact with Patient 02/14/24 2041     (approximate)   History   Psychiatric Evaluation   HPI  Derrick Atkins is a 53 y.o. male pmh of history of cerebral palsy, diabetes, hypertension, schizophrenia who presents to our facility after discharge yesterday for increased anxiety and depression.  He presents voluntarily and per the triage note states that he is going to have a nervous breakdown.  Patient will not verbally talk to me or communicate with me.  But nods yes that he wants to be evaluated by psych and nods no to any pain or any suicidal ideation      Physical Exam   Triage Vital Signs: ED Triage Vitals  Encounter Vitals Group     BP 02/14/24 2014 (!) 146/72     Girls Systolic BP Percentile --      Girls Diastolic BP Percentile --      Boys Systolic BP Percentile --      Boys Diastolic BP Percentile --      Pulse Rate 02/14/24 2014 68     Resp 02/14/24 2014 19     Temp 02/14/24 2014 97.8 F (36.6 C)     Temp src --      SpO2 02/14/24 2014 97 %     Weight 02/14/24 2012 189 lb 9.5 oz (86 kg)     Height 02/14/24 2012 6' 4 (1.93 m)     Head Circumference --      Peak Flow --      Pain Score 02/14/24 2012 8     Pain Loc --      Pain Education --      Exclude from Growth Chart --     Most recent vital signs: Vitals:   02/14/24 2014  BP: (!) 146/72  Pulse: 68  Resp: 19  Temp: 97.8 F (36.6 C)  SpO2: 97%    Nursing Triage Note reviewed. Vital signs reviewed and patients oxygen saturation is normoxic  General: Patient is well nourished, well developed, awake and alert, resting comfortably in no acute distress Head: Normocephalic and atraumatic Eyes: Normal inspection, extraocular muscles intact, no conjunctival pallor Ear, nose, throat: Normal external exam Neck: Normal range of motion Respiratory: Patient is in no respiratory distress, lungs  CTAB Cardiovascular: Patient is not tachycardic, RRR without murmur appreciated GI: Abd SNT with no guarding or rebound  Back: Normal inspection of the back with good strength and range of motion throughout all ext Extremities: pulses intact with good cap refills, no LE pitting edema or calf tenderness Neuro: The patient is alert, not talking at this time. and time, appropriately conversive, with 5/5 bilat UE/LE strength, no gross motor or sensory defects noted. Coordination appears to be adequate. Skin: Warm, dry, and intact Psych: depressed mood, unable to assess anything else  ED Results / Procedures / Treatments   Labs (all labs ordered are listed, but only abnormal results are displayed) Labs Reviewed  COMPREHENSIVE METABOLIC PANEL WITH GFR - Abnormal; Notable for the following components:      Result Value   CO2 20 (*)    Glucose, Bld 163 (*)    All other components within normal limits  CBC - Abnormal; Notable for the following components:   Hemoglobin 12.2 (*)    HCT 36.3 (*)    Platelets 130 (*)    All other components within normal limits  SALICYLATE  LEVEL - Abnormal; Notable for the following components:   Salicylate Lvl <7.0 (*)    All other components within normal limits  ACETAMINOPHEN  LEVEL - Abnormal; Notable for the following components:   Acetaminophen  (Tylenol ), Serum <10 (*)    All other components within normal limits  ETHANOL  URINE DRUG SCREEN, QUALITATIVE ([facility] ONLY)     EKG None  RADIOLOGY None    PROCEDURES:  Critical Care performed: No  Procedures   MEDICATIONS ORDERED IN ED: Medications - No data to display   IMPRESSION / MDM / ASSESSMENT AND PLAN / ED COURSE                                Differential diagnosis includes, but is not limited to, organic psychiatric disease, electrolyte derangement anemia, substance use  ED course: Evaluation of this patient is difficult as he would not verbally talk to me at this time.  He is in  no distress and put nursing in triage he presented for anxiety. I do not think he meets criteria given what was documented during his last ED stay for an involuntary hold.  He has no profound electrolyte derangements anemia and no ethanol elevation.  He does want to stay for evaluation.  At this time he is medically cleared for psychiatric assessment  Risk: 5 This patient has a high risk of morbidity due to further diagnostic testing or treatment. Rationale: This patient's evaluation and management involve a high risk of morbidity due to the potential severity of presenting symptoms, need for diagnostic testing, and/or initiation of treatment that may require close monitoring. The differential includes conditions with potential for significant deterioration or requiring escalation of care. Treatment decisions in the ED, including medication administration, procedural interventions, or disposition planning, reflect this level of risk. Additional Support: -- Drug therapy requiring intensive monitoring for toxicity [ ]  -- Decision regarding elective major surgery with idenitified patient or procedure risk factors [ ]  -- Decision regarding hospitalization or escalation of hospital-level care [ ]  -- Decision not to resuscitate or to de-escalate care because of poor prognosis [ ]  -- Parental controlled substances [ ]   COPA: 5 The patient has a severe exacerbation, progression, or side effect of treatment of the following illness/illnesses: []  OR  The patient has the following acute or chronic illness/injury that poses a possible threat to life or bodily function: [X] : The patient has a potentially serious acute condition or an acute exacerbation of a chronic illness requiring urgent evaluation and management in the Emergency Department. The clinical presentation necessitates immediate consideration of life-threatening or function-threatening diagnoses, even if they are ultimately ruled out.  Data(2/3  categories following were performed): 5 I reviewed or ordered at least three unique tests, external notes, and/or the history required an independent historian as one of the three requirements as following: cbc, bmp, ethanol AND  I independently interpreted the following test: []  OR  I discussed the management of the patient with the following external physician or qualified healthcare provider: Psychiatry.     Suggested E/M Coding Level: 5, 99285, This has been selected based on the 03/15/22 CPT guidelines for E/M codes in the Emergency Department based on 2/3 of the CoPA, Data, and Risk.         FINAL CLINICAL IMPRESSION(S) / ED DIAGNOSES   Final diagnoses:  Anxiety  Muteness     Rx / DC Orders   ED Discharge Orders  None        Note:  This document was prepared using Dragon voice recognition software and may include unintentional dictation errors.   Nicholaus Rolland BRAVO, MD 02/14/24 2159

## 2024-02-14 NOTE — ED Notes (Signed)
 This RN received phone call from Rosana Chalet from patients group home. Per Mr. Chalet, pt will be moving to a special locked unit upon return to home and requires FL2 stating same.  Rosana Chalet [phone]

## 2024-02-14 NOTE — ED Notes (Signed)
 Pt given snack and drink. Pt calm and cooperative at this time.

## 2024-02-14 NOTE — ED Triage Notes (Signed)
 Pt to ED via [employer] [facility], pt reports he began to have a nervous breakdown today and that has led him to want to harm himself. Pt calm and cooperative.

## 2024-02-14 NOTE — ED Notes (Addendum)
 Pt belongings:  Plaid shirt Blue jeans Grey shoes Blue socks Blue plaid underwear

## 2024-02-15 DIAGNOSIS — F419 Anxiety disorder, unspecified: Secondary | ICD-10-CM | POA: Diagnosis not present

## 2024-02-15 NOTE — ED Notes (Signed)
 Patient had a snack at this time.

## 2024-02-15 NOTE — Consult Note (Signed)
 [facility] Psychiatric Consult Initial  Patient Name: .Derrick Atkins  MRN: [id number]  DOB: 1971/05/10  Consult Order details:  Orders (From admission, onward)     Start     Ordered   02/14/24 2200  CONSULT TO CALL ACT TEAM       Ordering Provider: Nicholaus Rolland BRAVO, MD  Provider:  (Not yet assigned)  Question:  Reason for Consult?  Answer:  Psych consult   02/14/24 2159   02/14/24 2200  IP CONSULT TO PSYCHIATRY       Ordering Provider: Nicholaus Rolland BRAVO, MD  Provider:  (Not yet assigned)  Question Answer Comment  Consult Timeframe URGENT - requires response within 12 hours   URGENT timeframe requires provider to provider communication, has the provider to provider communication been completed Yes   Reason for Consult? Consult for medication management   Contact phone number where the requesting provider can be reached [phone]      02/14/24 2159             Mode of Visit: Tele-visit Virtual Statement:TELE PSYCHIATRY ATTESTATION & CONSENT As the provider for this telehealth consult, I attest that I verified the patient's identity using two separate identifiers, introduced myself to the patient, provided my credentials, disclosed my location, and performed this encounter via a HIPAA-compliant, real-time, face-to-face, two-way, interactive audio and video platform and with the full consent and agreement of the patient (or guardian as applicable.) Patient physical location: [facility]. Telehealth provider physical location: home office in state of [place].   Video start time:   Video end time:      Psychiatry Consult Evaluation  Service Date: February 15, 2024 LOS:  LOS: 0 days  Chief Complaint Going to a have nervous breaking  Primary Psychiatric Diagnoses  Cerebral palsy   Assessment  Derrick Atkins is a 53 y.o. male admitted: Presented to the EDfor 02/14/2024  8:41 PM for anxiety and depression following a recent discharge from the facility. He  carries the psychiatric diagnoses of schizophrenia, anxiety disorder, and major depressive disorder, and has a past medical history of cerebral palsy, diabetes, and hypertension.  His current presentation of anxiety, minimal verbalization, and social withdrawal is most consistent with chronic schizophrenia and anxiety, without acute psychosis or safety risk. He meets criteria for outpatient psychiatric follow-up based on stable presentation, denial of SI/HI, and no evidence of decompensation requiring inpatient care.  Current outpatient psychotropic medications include none reported at this visit, and historically he has had a variable response to psychiatric medications. He was partially compliant with medications prior to admission as evidenced by recent discharge and return for evaluation. On initial examination, patient was alert, withdrawn, flat in affect, and  denying SI, HI, and AVH.  Diagnoses:  Active Hospital problems: Active Problems:   * No active hospital problems. *    Plan   ## Psychiatric Medication Recommendations:  none  ## Medical Decision Making Capacity: Not specifically addressed in this encounter    ## Disposition:-- There are no psychiatric contraindications to discharge at this time  ## Behavioral / Environmental: - No specific recommendations at this time.     ## Safety and Observation Level:  - Based on my clinical evaluation, I estimate the patient to be at no risk of self harm in the current setting. - At this time, we recommend  routine. This decision is based on my review of the chart including patient's history and current presentation, interview of the patient,  mental status examination, and consideration of suicide risk including evaluating suicidal ideation, plan, intent, suicidal or self-harm behaviors, risk factors, and protective factors. This judgment is based on our ability to directly address suicide risk, implement suicide prevention strategies,  and develop a safety plan while the patient is in the clinical setting. Please contact our team if there is a concern that risk level has changed.  CSSR Risk Category:C-SSRS RISK CATEGORY: High Risk  Suicide Risk Assessment: Patient has following modifiable risk factors for suicide: triggering events, which we are addressing by providing outpatient resources. Patient has following non-modifiable or demographic risk factors for suicide: male gender Patient has the following protective factors against suicide: Access to outpatient mental health care  Thank you for this consult request. Recommendations have been communicated to the primary team.  We will not recommend inpatient admission at this time.   Sherise Geerdes, NP       History of Present Illness  Relevant Aspects of Hospital ED Course:  Admitted on 02/14/2024 for depression and anxiety.   Patient Report:  Derrick Atkins. [name] is a 53 year old male with a past medical history of cerebral palsy, diabetes, hypertension, and schizophrenia who presented voluntarily to the ED after being discharged from the facility yesterday. Per triage, the patient stated that he was "going to have a nervous breakdown."  During evaluation, the patient talked nonstop. He denies suicidal ideation (SI), homicidal ideation (HI), and auditory/visual hallucinations (AVH).  He stated that he is afraid to trust people and is working on learning to like himself before he can be anyone's friend. He appears to need supportive interaction and someone to talk to. The patient has visited the ED a couple of days ago for anxiety.  Psych ROS:  Depression: denies Anxiety:  yes Mania (lifetime and current): no Psychosis: (lifetime and current): no   Review of Systems  Constitutional: Negative.   HENT: Negative.    Respiratory: Negative.    Cardiovascular: Negative.   Genitourinary: Negative.   Musculoskeletal: Negative.   Skin: Negative.   Neurological:  Negative.   Psychiatric/Behavioral:  Positive for depression.      Psychiatric and Social History  Psychiatric History:  Information collected from Patient  Prev Dx/Sx: schizophrenia Current Psych Provider: unknown Home Meds (current): unknown Previous Med Trials: unknown Therapy: denies  Prior Psych Hospitalization: unknown  Prior Self Harm: denies Prior Violence: denies  Family Psych History: No pertinent family psych history Family Hx suicide: No family hx of suicide  Social History:  Developmental Hx: unknown Educational Hx: unknown Occupational Hx: unknown Legal Hx: unknown Living Situation: group home Spiritual Hx: unknown Access to weapons/lethal means: no   Substance History Alcohol : denies  Tobacco: denies Illicit drugs: denies Prescription drug abuse: denies Rehab hx: unknown  Exam Findings   Vital Signs:  Temp:  [97.8 F (36.6 C)] 97.8 F (36.6 C) (08/05 2014) Pulse Rate:  [68] 68 (08/05 2014) Resp:  [19] 19 (08/05 2014) BP: (146)/(72) 146/72 (08/05 2014) SpO2:  [97 %] 97 % (08/05 2014) Weight:  [86 kg] 86 kg (08/05 2012) Blood pressure (!) 146/72, pulse 68, temperature 97.8 F (36.6 C), resp. rate 19, height 6' 4 (1.93 m), weight 86 kg, SpO2 97%. Body mass index is 23.08 kg/m.  Physical Exam Constitutional:      Appearance: He is normal weight.  HENT:     Head: Normocephalic.     Right Ear: Ear canal normal.     Mouth/Throat:     Pharynx: Oropharynx  is clear.  Eyes:     Extraocular Movements: Extraocular movements intact.  Pulmonary:     Effort: Pulmonary effort is normal.  Abdominal:     General: Abdomen is flat.  Musculoskeletal:     Cervical back: Normal range of motion.  Skin:    General: Skin is warm.         Other History   These have been pulled in through the EMR, reviewed, and updated if appropriate.  Family History:  The patient's family history includes Heart attack in his father; Heart disease in his  father.  Medical History: Past Medical History:  Diagnosis Date   Cerebral palsy (HCC)    Depression    Diabetes mellitus without complication (HCC)    Hypertension    Kidney stones    Schizoaffective disorder (HCC)    Scoliosis     Surgical History: Past Surgical History:  Procedure Laterality Date   BOWEL RESECTION     CATARACT EXTRACTION W/PHACO Right 09/15/2022   Procedure: CATARACT EXTRACTION PHACO AND INTRAOCULAR LENS PLACEMENT (IOC) RIGHT DIABETIC VISION BLUE HEALON 5  15.79  01:31.9;  Surgeon: Mittie Gaskin, MD;  Location: [facility];  Service: Ophthalmology;  Laterality: Right;  Latex Diabetic   INCISION AND DRAINAGE ABSCESS Left 12/20/2022   Procedure: INCISION AND DRAINAGE ABSCESS;  Surgeon: Lane Shope, MD;  Location: [facility];  Service: General;  Laterality: Left;   LAPAROTOMY N/A 05/15/2020   Procedure: EXPLORATORY LAPAROTOMY;  Surgeon: Desiderio Schanz, MD;  Location: [facility];  Service: General;  Laterality: N/A;     Medications:   Current Facility-Administered Medications:    albuterol  (VENTOLIN  HFA) 108 (90 Base) MCG/ACT inhaler 2 puff, 2 puff, Inhalation, Q4H PRN, Nicholaus Rolland BRAVO, MD   ascorbic acid  (VITAMIN C ) tablet 500 mg, 500 mg, Oral, Daily, Nicholaus Rolland E, MD   aspirin  EC tablet 81 mg, 81 mg, Oral, Daily, Nicholaus Rolland E, MD   benztropine  (COGENTIN ) tablet 1 mg, 1 mg, Oral, Daily, Nicholaus Rolland BRAVO, MD   cholecalciferol  (VITAMIN D3) 25 MCG (1000 UNIT) tablet 1,000 Units, 1,000 Units, Oral, Daily, Nicholaus Rolland E, MD   cyanocobalamin  (VITAMIN B12) tablet 1,000 mcg, 1,000 mcg, Oral, Daily, Nicholaus Rolland E, MD   fenofibrate  tablet 54 mg, 54 mg, Oral, Daily, Nicholaus Rolland E, MD   iron  polysaccharides (NIFEREX) capsule 150 mg, 150 mg, Oral, Daily, Nicholaus Rolland E, MD   levothyroxine  (SYNTHROID ) tablet 88 mcg, 88 mcg, Oral, Q0600, Nicholaus Rolland BRAVO, MD   linaclotide  (LINZESS ) capsule 290 mcg, 290 mcg, Oral, Daily, Nicholaus Rolland E, MD    metoprolol  tartrate (LOPRESSOR ) tablet 25 mg, 25 mg, Oral, BID, Nicholaus Rolland E, MD, 25 mg at 02/14/24 2334   OLANZapine  (ZYPREXA ) tablet 15 mg, 15 mg, Oral, QHS, Nicholaus Rolland E, MD, 15 mg at 02/14/24 2334   pantoprazole  (PROTONIX ) EC tablet 40 mg, 40 mg, Oral, QAC breakfast, Nicholaus Rolland E, MD   rosuvastatin  (CRESTOR ) tablet 5 mg, 5 mg, Oral, Daily, Davis, Hillary E, MD   senna (SENOKOT) tablet 17.2 mg, 2 tablet, Oral, QHS, Nicholaus Rolland E, MD, 17.2 mg at 02/14/24 2334   traZODone  (DESYREL ) tablet 50 mg, 50 mg, Oral, QHS, Nicholaus Rolland E, MD, 50 mg at 02/14/24 2337  Current Outpatient Medications:    ABILIFY  MAINTENA 400 MG PRSY prefilled syringe, Inject 400 mg into the muscle every 28 (twenty-eight) days., Disp: , Rfl:    albuterol  (VENTOLIN  HFA) 108 (90 Base) MCG/ACT inhaler, Inhale 2 puffs into the lungs every  4 (four) hours as needed for shortness of breath or wheezing., Disp: 17 each, Rfl: 0   ascorbic acid  (VITAMIN C ) 500 MG tablet, Take 1 tablet (500 mg total) by mouth daily., Disp: 30 tablet, Rfl: 3   aspirin  EC 81 MG tablet, Take 1 tablet (81 mg total) by mouth daily. Swallow whole., Disp: 30 tablet, Rfl: 12   benztropine  (COGENTIN ) 1 MG tablet, Take 1 tablet (1 mg total) by mouth daily., Disp: 30 tablet, Rfl: 0   cholecalciferol  (VITAMIN D3) 25 MCG (1000 UNIT) tablet, Take 1 tablet (1,000 Units total) by mouth daily., Disp: 30 tablet, Rfl: 0   cyanocobalamin  (VITAMIN B12) 1000 MCG tablet, Take 1 tablet (1,000 mcg total) by mouth daily., Disp: 30 tablet, Rfl: 0   fenofibrate  54 MG tablet, Take 1 tablet (54 mg total) by mouth daily., Disp: 30 tablet, Rfl: 0   FLUoxetine  (PROZAC ) 20 MG capsule, Take 1 capsule (20 mg total) by mouth daily., Disp: 30 capsule, Rfl: 0   folic acid  (FOLVITE ) 1 MG tablet, Take 1 tablet (1 mg total) by mouth daily., Disp: 30 tablet, Rfl: 0   iron  polysaccharides (NIFEREX) 150 MG capsule, Take 1 capsule (150 mg total) by mouth daily., Disp: 30 capsule, Rfl:  0   levothyroxine  (SYNTHROID ) 88 MCG tablet, Take 1 tablet (88 mcg total) by mouth daily at 6 (six) AM., Disp: 30 tablet, Rfl: 0   LINZESS  290 MCG CAPS capsule, Take 1 capsule (290 mcg total) by mouth daily., Disp: 30 capsule, Rfl: 0   metoprolol  tartrate (LOPRESSOR ) 25 MG tablet, Take 1 tablet (25 mg total) by mouth 2 (two) times daily., Disp: 30 tablet, Rfl: 0   OLANZapine  (ZYPREXA ) 15 MG tablet, Take 1 tablet (15 mg total) by mouth at bedtime., Disp: 30 tablet, Rfl: 0   pantoprazole  (PROTONIX ) 40 MG tablet, Take 1 tablet (40 mg total) by mouth daily before breakfast., Disp: 30 tablet, Rfl: 0   potassium chloride  SA (KLOR-CON  M) 20 MEQ tablet, Take 1 tablet (20 mEq total) by mouth daily., Disp: 30 tablet, Rfl: 0   rosuvastatin  (CRESTOR ) 5 MG tablet, Take 1 tablet (5 mg total) by mouth daily., Disp: 30 tablet, Rfl: 0   senna (SENOKOT) 8.6 MG TABS tablet, Take 2 tablets by mouth at bedtime., Disp: , Rfl:    traZODone  (DESYREL ) 50 MG tablet, Take 1 tablet (50 mg total) by mouth at bedtime., Disp: 30 tablet, Rfl: 0  Allergies: Allergies  Allergen Reactions   Valproic Acid  And Related Other (See Comments)    Hyperammonemic encephalopathy   Phenytoin Sodium Extended Other (See Comments) and Nausea And Vomiting   Prednisone Hives and Nausea And Vomiting   Latex Hives, Nausea And Vomiting and Rash    Anahis Furgeson, NP

## 2024-02-15 NOTE — ED Notes (Signed)
 Patient is in the dayroom at this time.

## 2024-02-15 NOTE — ED Notes (Signed)
 Pt. To BHU from ED ambulatory without difficulty, to room  BHU 4. Report from [facility]. Pt. Is alert and oriented, warm and dry in no distress. Pt. Denies SI, HI, and VH. Pt. Calm and cooperative. Pt states he is having AH and its just mumbling. Pt. Made aware of security cameras and Q15 minute rounds. Pt. Encouraged to let Nursing staff know of any concerns or needs.    ENVIRONMENTAL ASSESSMENT Potentially harmful objects out of patient reach: Yes.   Personal belongings secured: Yes.   Patient dressed in hospital provided attire only: Yes.   Plastic bags out of patient reach: Yes.   Patient care equipment (cords, cables, call bells, lines, and drains) shortened, removed, or accounted for: Yes.   Equipment and supplies removed from bottom of stretcher: Yes.   Potentially toxic materials out of patient reach: Yes.   Sharps container removed or out of patient reach: Yes.

## 2024-02-15 NOTE — ED Provider Notes (Signed)
 Emergency Medicine Observation Re-evaluation Note  RAMIREZ FULLBRIGHT is a 53 y.o. male, seen on rounds today.  Pt initially presented to the ED for complaints of Psychiatric Evaluation  Currently, the patient is calm, no acute complaints.  Physical Exam  Blood pressure 129/69, pulse 73, temperature 98.3 F (36.8 C), temperature source Oral, resp. rate 16, height 6' 4 (1.93 m), weight 86 kg, SpO2 98%. Physical Exam General: NAD Lungs: CTAB Psych: not agitated  ED Course / MDM  EKG:    I have reviewed the labs performed to date as well as medications administered while in observation.  Recent changes in the last 24 hours include no acute events overnight.  Seen and cleared by psychiatry.  Guardian coming to pick patient up today.  Plan  Current plan is for discharge with guardian. Patient is not under full IVC at this time.   Viviann Pastor, MD 02/15/24 1025

## 2024-02-15 NOTE — ED Notes (Signed)
 Pt discharged at this time. RN reviewed discharge instructions with pt. Pt verbalized understanding. Vital signs taken. RR even and unlabored. Pt denies any questions or needs at this time. Pt ambulatory at discharge with all belongings. Pt picked up by legal guardian and taken back to group home.

## 2024-02-15 NOTE — ED Notes (Signed)
 Pt given safe clean items for shower. Pt in shower at this time.

## 2024-02-15 NOTE — ED Notes (Signed)
 Assumed care of pt. Report received from previous RN. Pt alert and oriented. Pt RR even and unlabored. Pt denies pain at this time. Pt calm and cooperative. Pt denies any needs at this time.

## 2024-02-16 ENCOUNTER — Emergency Department: Admission: EM | Admit: 2024-02-16 | Discharge: 2024-03-13 | Disposition: A | Discharge status: Home / Self Care

## 2024-02-16 DIAGNOSIS — Z7982 Long term (current) use of aspirin: Secondary | ICD-10-CM | POA: Insufficient documentation

## 2024-02-16 DIAGNOSIS — D72829 Elevated white blood cell count, unspecified: Secondary | ICD-10-CM | POA: Insufficient documentation

## 2024-02-16 DIAGNOSIS — E119 Type 2 diabetes mellitus without complications: Secondary | ICD-10-CM | POA: Diagnosis not present

## 2024-02-16 DIAGNOSIS — D649 Anemia, unspecified: Secondary | ICD-10-CM | POA: Insufficient documentation

## 2024-02-16 DIAGNOSIS — I10 Essential (primary) hypertension: Secondary | ICD-10-CM | POA: Insufficient documentation

## 2024-02-16 DIAGNOSIS — S0990XA Unspecified injury of head, initial encounter: Secondary | ICD-10-CM | POA: Diagnosis present

## 2024-02-16 DIAGNOSIS — Z008 Encounter for other general examination: Secondary | ICD-10-CM | POA: Diagnosis not present

## 2024-02-16 DIAGNOSIS — S0083XA Contusion of other part of head, initial encounter: Secondary | ICD-10-CM | POA: Insufficient documentation

## 2024-02-16 DIAGNOSIS — Z79899 Other long term (current) drug therapy: Secondary | ICD-10-CM | POA: Diagnosis not present

## 2024-02-16 DIAGNOSIS — F209 Schizophrenia, unspecified: Secondary | ICD-10-CM | POA: Diagnosis not present

## 2024-02-16 DIAGNOSIS — G809 Cerebral palsy, unspecified: Secondary | ICD-10-CM | POA: Diagnosis not present

## 2024-02-16 DIAGNOSIS — R4689 Other symptoms and signs involving appearance and behavior: Secondary | ICD-10-CM

## 2024-02-16 DIAGNOSIS — X58XXXA Exposure to other specified factors, initial encounter: Secondary | ICD-10-CM | POA: Insufficient documentation

## 2024-02-16 LAB — COMPREHENSIVE METABOLIC PANEL WITH GFR
ALT: 17 U/L (ref 0–44)
AST: 27 U/L (ref 15–41)
Albumin: 4.6 g/dL (ref 3.5–5.0)
Alkaline Phosphatase: 82 U/L (ref 38–126)
Anion gap: 13 (ref 5–15)
BUN: 17 mg/dL (ref 6–20)
CO2: 23 mmol/L (ref 22–32)
Calcium: 9.5 mg/dL (ref 8.9–10.3)
Chloride: 106 mmol/L (ref 98–111)
Creatinine, Ser: 0.86 mg/dL (ref 0.61–1.24)
GFR, Estimated: >60 mL/min (ref >60)
Glucose, Bld: 127 mg/dL — ABNORMAL HIGH (ref 70–99)
Potassium: 3.7 mmol/L (ref 3.5–5.1)
Sodium: 142 mmol/L (ref 135–145)
Total Bilirubin: 1 mg/dL (ref 0.0–1.2)
Total Protein: 7.3 g/dL (ref 6.5–8.1)

## 2024-02-16 LAB — CBC
HCT: 38.7 % — ABNORMAL LOW (ref 39.0–52.0)
Hemoglobin: 12.8 g/dL — ABNORMAL LOW (ref 13.0–17.0)
MCH: 27.8 pg (ref 26.0–34.0)
MCHC: 33.1 g/dL (ref 30.0–36.0)
MCV: 84.1 fL (ref 80.0–100.0)
Platelets: 143 K/uL — ABNORMAL LOW (ref 150–400)
RBC: 4.6 MIL/uL (ref 4.22–5.81)
RDW: 13.3 % (ref 11.5–15.5)
WBC: 8.9 K/uL (ref 4.0–10.5)
nRBC: 0 % (ref 0.0–0.2)

## 2024-02-16 LAB — URINE DRUG SCREEN, QUALITATIVE (ARMC ONLY)
Amphetamines, Ur Screen: NOT DETECTED
Barbiturates, Ur Screen: NOT DETECTED
Benzodiazepine, Ur Scrn: NOT DETECTED
Cannabinoid 50 Ng, Ur ~~LOC~~: NOT DETECTED
Cocaine Metabolite,Ur ~~LOC~~: NOT DETECTED
MDMA (Ecstasy)Ur Screen: POSITIVE — AB
Methadone Scn, Ur: NOT DETECTED
Opiate, Ur Screen: NOT DETECTED
Phencyclidine (PCP) Ur S: NOT DETECTED
Tricyclic, Ur Screen: NOT DETECTED

## 2024-02-16 LAB — ETHANOL: Alcohol, Ethyl (B): <15 mg/dL (ref <15)

## 2024-02-16 NOTE — ED Notes (Signed)
 Patient was calm, depressed when this writer came into the room. Patient didn't answer questions. Patient later beat on the bed after this writer left. Patient was told to come to the doorway if he needed help. Patient didn't reply.

## 2024-02-16 NOTE — ED Notes (Signed)
Patient given snack.  

## 2024-02-16 NOTE — ED Triage Notes (Signed)
 Pt to ED with PD from group home for psychiatric evaluation VOL. Pt tearful in triage stating he ran away from group home and was picked up by PD. Pt refusing to state his needs in triage and is denying SI/HI. Pt is cooperative in triage.

## 2024-02-16 NOTE — ED Notes (Signed)
 Patient has a soft voice, appears depressed. Patient states he doesn't like his group home and feels physically threatened.

## 2024-02-16 NOTE — Consult Note (Signed)
 Patient is a 53 year old male who was just discharged from here today morning he reportedly has been in the group home patient has a diagnosis of schizophrenia and depression he reports that he feels overwhelmed because there is nobody there at the group home to talk to he is working with his Child psychotherapist for transfer he does not function well in that setting he feels he has been hearing voices but these perceptual disturbances are longstanding and talks about trying cognitive things currently he is denying any suicidal homicidal thoughts his main concern is placement in a setting where he feels like he has people to talk to.  Psych ROS:  Depression: endorses  Anxiety:  endorses  Mania (lifetime and current): history of Psychosis: (lifetime and current): history of schizophrenia   Collateral information:  Unavailable. Attempted to contact Derrick Atkins DSS guardian. Left message to contact Behavioral services.        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 rate 18, SpO2 100%.  Past Medical History:  Diagnosis Date   Cerebral palsy (HCC)    Depression    Diabetes mellitus without complication (HCC)    Hypertension    Kidney stones    Schizoaffective disorder (HCC)    Scoliosis      Past Surgical History:  Procedure Laterality Date   BOWEL RESECTION     CATARACT EXTRACTION W/PHACO Right 09/15/2022   Procedure: CATARACT EXTRACTION PHACO AND INTRAOCULAR LENS PLACEMENT (IOC) RIGHT DIABETIC VISION BLUE HEALON 5  15.79  01:31.9;  Surgeon: Mittie Gaskin, MD;  Location: [facility];  Service: Ophthalmology;  Laterality: Right;  Latex Diabetic   INCISION AND DRAINAGE ABSCESS Left 12/20/2022   Procedure: INCISION AND DRAINAGE ABSCESS;  Surgeon: Lane Shope, MD;  Location: [facility];  Service: General;  Laterality: Left;   LAPAROTOMY N/A 05/15/2020   Procedure: EXPLORATORY LAPAROTOMY;  Surgeon: Desiderio Schanz, MD;  Location: [facility];  Service: General;  Laterality: N/A;      Mental status examination 53 year old male who appears stated age thin build alert oriented x 2 mood is okay affect is constricted very selectively mute awake denies any suicidal thoughts vape or substance Insight basement questionable  Assessment schizophrenia paranoid type  Recommendations due to several visits in the emergency room and him coming right back I recommend social work team working with his outpatient social work team for placement and restart home medications.  From a psychiatric standpoint he does not meet the criteria for psychiatric inpatient    No current facility-administered medications for this encounter.   Current Outpatient Medications  Medication Sig Dispense Refill   ABILIFY  MAINTENA 400 MG PRSY prefilled syringe Inject 400 mg  into the muscle every 28 (twenty-eight) days.     albuterol  (VENTOLIN  HFA) 108 (90 Base) MCG/ACT inhaler Inhale 2 puffs into the lungs every 4 (four) hours as needed for  shortness of breath or wheezing. 17 each 0   ascorbic acid  (VITAMIN C ) 500 MG tablet Take 1 tablet (500 mg total) by mouth daily. 30 tablet 3   aspirin  EC 81 MG tablet Take 1 tablet (81 mg total) by mouth daily. Swallow whole. 30 tablet 12   benztropine  (COGENTIN ) 1 MG tablet Take 1 tablet (1 mg total) by mouth daily. 30 tablet 0   cholecalciferol  (VITAMIN D3) 25 MCG (1000 UNIT) tablet Take 1 tablet (1,000 Units total) by mouth daily. 30 tablet 0   cyanocobalamin  (VITAMIN B12) 1000 MCG tablet Take 1 tablet (1,000 mcg total) by mouth daily. 30 tablet 0   fenofibrate  54 MG tablet Take 1 tablet (54 mg total) by mouth daily. 30 tablet 0   FLUoxetine  (PROZAC ) 20 MG capsule Take 1 capsule (20 mg total) by mouth daily. 30 capsule 0   folic acid  (FOLVITE ) 1 MG tablet Take 1 tablet (1 mg total) by mouth daily. 30 tablet 0   iron  polysaccharides (NIFEREX) 150 MG capsule Take 1 capsule (150 mg total) by mouth daily. 30 capsule 0   levothyroxine  (SYNTHROID ) 88 MCG tablet Take 1 tablet (88 mcg total) by mouth daily at 6 (six) AM. 30 tablet 0   metoprolol  tartrate (LOPRESSOR ) 25 MG tablet Take 1 tablet (25 mg total) by mouth 2 (two) times daily. 30 tablet 0   OLANZapine  (ZYPREXA ) 15 MG tablet Take 1 tablet (15 mg total) by mouth at bedtime. 30 tablet 0   pantoprazole  (PROTONIX ) 40 MG tablet Take 1 tablet (40 mg total) by mouth daily before breakfast. 30 tablet 0   potassium chloride  SA (KLOR-CON  M) 20 MEQ tablet Take 1 tablet (20 mEq total) by mouth daily. 30 tablet 0   rosuvastatin  (CRESTOR ) 5 MG tablet Take 1 tablet (5 mg total) by mouth daily. 30 tablet 0   senna (SENOKOT) 8.6 MG TABS tablet Take 2 tablets by mouth at bedtime.     traZODone  (DESYREL ) 50 MG tablet Take 1 tablet (50 mg total) by mouth at bedtime. 30 tablet 0   LINZESS  290 MCG CAPS capsule Take 1 capsule (290 mcg total) by mouth daily. 30 capsule 0

## 2024-02-16 NOTE — ED Provider Notes (Signed)
 [facility] Provider Note    Event Date/Time   First MD Initiated Contact with Patient 02/16/24 1203     (approximate)   History   Psychiatric Evaluation  Pt to ED with PD from group home for psychiatric evaluation VOL. Pt tearful in triage stating he ran away from group home and was picked up by PD. Pt refusing to state his needs in triage and is denying SI/HI. Pt is cooperative in triage.    HPI Derrick Atkins is a 53 y.o. male PMH schizophrenia, T2DM, hypertension presents for behavioral issue - Patient states he ran away from his group home today because he does not have friends there and does not like it there.  Went walk around [employer].  Was probably picked up by police department and brought to emergency department for eval. - Patient does endorse hallucinations but does not describe them any further.  Denies SI, HI.  Per chart review, patient was last seen in our emergency department on 02/14/2024     Physical Exam   Triage Vital Signs: ED Triage Vitals [02/16/24 1145]  Encounter Vitals Group     BP (!) 163/72     Girls Systolic BP Percentile      Girls Diastolic BP Percentile      Boys Systolic BP Percentile      Boys Diastolic BP Percentile      Pulse Rate 82     Resp 18     Temp 97.8 F (36.6 C)     Temp Source Oral     SpO2 100 %     Weight      Height      Head Circumference      Peak Flow      Pain Score 0     Pain Loc      Pain Education      Exclude from Growth Chart     Most recent vital signs: Vitals:   02/16/24 1145  BP: (!) 163/72  Pulse: 82  Resp: 18  Temp: 97.8 F (36.6 C)  SpO2: 100%     General: Awake, no distress.  HEENT: Normocephalic, does have mild bruise to left cheek (nontender, maxilla stable)  CV:  Good peripheral perfusion. RRR, RP 2+ Resp:  Normal effort. CTAB Abd:  No distention. Nontender to deep palpation throughout Other:     ED Results / Procedures / Treatments   Labs (all labs  ordered are listed, but only abnormal results are displayed) Labs Reviewed  COMPREHENSIVE METABOLIC PANEL WITH GFR - Abnormal; Notable for the following components:      Result Value   Glucose, Bld 127 (*)    All other components within normal limits  CBC - Abnormal; Notable for the following components:   Hemoglobin 12.8 (*)    HCT 38.7 (*)    Platelets 143 (*)    All other components within normal limits  ETHANOL  URINE DRUG SCREEN, QUALITATIVE ([facility] ONLY)     EKG  N/a   RADIOLOGY N/a    PROCEDURES:  Critical Care performed: No  Procedures   MEDICATIONS ORDERED IN ED: Medications - No data to display   IMPRESSION / MDM / ASSESSMENT AND PLAN / ED COURSE  I reviewed the triage vital signs and the nursing notes.                              DDX/MDM/AP:  Differential diagnosis includes, but is not limited to, primary psychiatric disorder as well as social problem, does not clearly meet involuntary commitment criteria on my initial eval however endorsing only nonspecific hallucinations, is very calm and cooperative here with no SI/HI.  Do not suspect any underlying organic etiology, consider concomitant substance use.  Plan: -Labs - TTS and psychiatric consultation, will not place on IVC at this time  Patient's presentation is most consistent with exacerbation of chronic illness.   ED course below.  Patient medically cleared, UDS pending.  Psychiatry consultation pending.  Disposition per psychiatry and social work team.  Signed out to oncoming ED provider pending specialist recommendations.  Clinical Course as of 02/16/24 1440  Thu Feb 16, 2024  1338 CBC with mild stable anemia, leukocytosis  CMP reviewed, unremarkable  EtOH negative  UDS pending [MM]    Clinical Course User Index [MM] Clarine Ozell LABOR, MD     FINAL CLINICAL IMPRESSION(S) / ED DIAGNOSES   Final diagnoses:  Abnormal behavior  Schizophrenia, unspecified type (HCC)     Rx / DC  Orders   ED Discharge Orders     None        Note:  This document was prepared using Dragon voice recognition software and may include unintentional dictation errors.   Clarine Ozell LABOR, MD 02/16/24 1440

## 2024-02-16 NOTE — BH Assessment (Signed)
 Comprehensive Clinical Assessment (CCA) Screening, Triage and Referral Note  02/16/2024 [name] [id number]  Chief Complaint:  Chief Complaint  Patient presents with   Psychiatric Evaluation   Visit Diagnosis: Anxiety  Derrick Atkins. Derrick Atkins is a 53 year old male who presents to the ER due to anxiety. Patient reports, he feels he isn't doing good, because he isn't working with his DSS Child psychotherapist. He explained, she is working on getting him placed in a new group home but he isn't patient and it is the cause of his anxiety. He reports of having AV/H, which is common for him. Per his report, they are not usually but "pretty much the same." Patient denies SI/HI and also denies the use of any mind-altering symptoms.  Patient Reported Information How did you hear about us ? Self  What Is the Reason for Your Visit/Call Today? Patient reports he feel like he isn't doing well.  How Long Has This Been Causing You Problems? 1 wk - 1 month  What Do You Feel Would Help You the Most Today? Treatment for Depression or other mood problem   Have You Recently Had Any Thoughts About Hurting Yourself? No  Are You Planning to Commit Suicide/Harm Yourself At This time? No   Have you Recently Had Thoughts About Hurting Someone [name]? No  Are You Planning to Harm Someone at This Time? No  Explanation: Pt endorse vague SI.   Have You Used Any Alcohol  or Drugs in the Past 24 Hours? No  How Long Ago Did You Use Drugs or Alcohol ? No data recorded What Did You Use and How Much? No data recorded  Do You Currently Have a Therapist/Psychiatrist? Yes  Name of Therapist/Psychiatrist: [facility]   Have You Been Recently Discharged From Any Office Practice or Programs? No  Explanation of Discharge From Practice/Program: No data recorded   CCA Screening Triage Referral Assessment Type of Contact: Face-to-Face  Telemedicine Service Delivery:   Is this Initial or Reassessment?   Date Telepsych consult  ordered in [facility]:    Time Telepsych consult ordered in [facility]:    Location of Assessment: [facility] ED  Provider Location: [facility] ED    Collateral Involvement: None   Does Patient Have a Court Appointed Legal Guardian? No data recorded Name and Contact of Legal Guardian: No data recorded If Minor and Not Living with Parent(s), Who has Custody? n/a  Is CPS involved or ever been involved? Never  Is APS involved or ever been involved? Never   Patient Determined To Be At Risk for Harm To Self or Others Based on Review of Patient Reported Information or Presenting Complaint? No  Method: No Plan  Availability of Means: No access or NA  Intent: Clearly intends on inflicting harm that could cause death  Notification Required: No need or identified person  Additional Information for Danger to Others Potential: Active psychosis  Additional Comments for Danger to Others Potential: n/a  Are There Guns or Other Weapons in Your Home? No  Types of Guns/Weapons: n/a  Are These Weapons Safely Secured?                            No  Who Could Verify You Are Able To Have These Secured: n/a  Do You Have any Outstanding Charges, Pending Court Dates, Parole/Probation? None reoprted  Contacted To Inform of Risk of Harm To Self or Others: Unable to Contact:   Does Patient Present under Involuntary Commitment? No  [place] of Residence: [place]   Patient Currently Receiving the Following Services: Medication Management   Determination of Need: Emergent (2 hours)   Options For Referral: ED Visit   Disposition Recommendation per psychiatric provider: Pending Psych Consult  Kiki DOROTHA Barge MS, LCAS, [facility], [facility] Therapeutic Triage Specialist 02/16/2024 2:51 PM

## 2024-02-16 NOTE — ED Notes (Signed)
 Report given to [facility] RN in the [employer]

## 2024-02-16 NOTE — ED Notes (Signed)
 Pt had a Extremely elevated yell Stated to leave him alone as I approach the room to ask what's going on he stated he's hearing Voices pt then started to beat on the bed Nurse has been notified

## 2024-02-16 NOTE — ED Notes (Signed)
 Pt dressed out in triage:  Automotive engineer belt [employer]

## 2024-02-17 MED ORDER — TRAZODONE HCL 50 MG PO TABS
50.0000 mg | ORAL_TABLET | Freq: Every day | ORAL | Status: DC
  Administered 2024-02-17 – 2024-03-12 (×28): 50 mg via ORAL
  Filled 2024-02-17 (×25): qty 1

## 2024-02-17 MED ORDER — PANTOPRAZOLE SODIUM 40 MG PO TBEC
40.0000 mg | DELAYED_RELEASE_TABLET | Freq: Every day | ORAL | Status: DC
  Administered 2024-02-18 – 2024-03-13 (×28): 40 mg via ORAL
  Filled 2024-02-17 (×25): qty 1

## 2024-02-17 MED ORDER — OLANZAPINE 5 MG PO TABS
15.0000 mg | ORAL_TABLET | Freq: Every day | ORAL | Status: DC
  Administered 2024-02-17 – 2024-03-12 (×28): 15 mg via ORAL
  Filled 2024-02-17 (×25): qty 1

## 2024-02-17 MED ORDER — VITAMIN D 25 MCG (1000 UNIT) PO TABS
1000.0000 [IU] | ORAL_TABLET | Freq: Every day | ORAL | Status: DC
  Administered 2024-02-18 – 2024-03-13 (×28): 1000 [IU] via ORAL
  Filled 2024-02-17 (×25): qty 1

## 2024-02-17 MED ORDER — FENOFIBRATE 54 MG PO TABS
54.0000 mg | ORAL_TABLET | Freq: Every day | ORAL | Status: DC
  Administered 2024-02-18 – 2024-03-12 (×27): 54 mg via ORAL
  Filled 2024-02-17 (×26): qty 1

## 2024-02-17 MED ORDER — LEVOTHYROXINE SODIUM 88 MCG PO TABS
88.0000 ug | ORAL_TABLET | Freq: Every day | ORAL | Status: DC
  Administered 2024-02-18 – 2024-03-13 (×28): 88 ug via ORAL
  Filled 2024-02-17 (×25): qty 1

## 2024-02-17 MED ORDER — POLYSACCHARIDE IRON COMPLEX 150 MG PO CAPS
150.0000 mg | ORAL_CAPSULE | Freq: Every day | ORAL | Status: DC
  Administered 2024-02-18 – 2024-03-12 (×27): 150 mg via ORAL
  Filled 2024-02-17 (×26): qty 1

## 2024-02-17 MED ORDER — FLUOXETINE HCL 20 MG PO CAPS
20.0000 mg | ORAL_CAPSULE | Freq: Every day | ORAL | Status: DC
  Administered 2024-02-17 – 2024-03-13 (×29): 20 mg via ORAL
  Filled 2024-02-17 (×26): qty 1

## 2024-02-17 MED ORDER — POTASSIUM CHLORIDE CRYS ER 20 MEQ PO TBCR
20.0000 meq | EXTENDED_RELEASE_TABLET | Freq: Every day | ORAL | Status: DC
  Administered 2024-02-18 – 2024-03-13 (×28): 20 meq via ORAL
  Filled 2024-02-17 (×25): qty 1

## 2024-02-17 MED ORDER — BENZTROPINE MESYLATE 1 MG PO TABS
1.0000 mg | ORAL_TABLET | Freq: Every day | ORAL | Status: DC
  Administered 2024-02-17 – 2024-03-13 (×29): 1 mg via ORAL
  Filled 2024-02-17 (×26): qty 1

## 2024-02-17 MED ORDER — VITAMIN B-12 1000 MCG PO TABS
1000.0000 ug | ORAL_TABLET | Freq: Every day | ORAL | Status: DC
  Administered 2024-02-18 – 2024-03-13 (×28): 1000 ug via ORAL
  Filled 2024-02-17 (×25): qty 1

## 2024-02-17 MED ORDER — ZIPRASIDONE MESYLATE 20 MG IM SOLR
20.0000 mg | Freq: Once | INTRAMUSCULAR | Status: AC
  Administered 2024-02-17: 20 mg via INTRAMUSCULAR

## 2024-02-17 MED ORDER — METOPROLOL TARTRATE 50 MG PO TABS
25.0000 mg | ORAL_TABLET | Freq: Two times a day (BID) | ORAL | Status: DC
  Administered 2024-02-17 – 2024-03-13 (×56): 25 mg via ORAL
  Filled 2024-02-17: qty 1
  Filled 2024-02-17 (×19): qty 0.5
  Filled 2024-02-17: qty 1
  Filled 2024-02-17 (×33): qty 0.5

## 2024-02-17 MED ORDER — FOLIC ACID 1 MG PO TABS
1.0000 mg | ORAL_TABLET | Freq: Every day | ORAL | Status: DC
  Administered 2024-02-18 – 2024-03-13 (×28): 1 mg via ORAL
  Filled 2024-02-17 (×25): qty 1

## 2024-02-17 MED ORDER — ROSUVASTATIN CALCIUM 5 MG PO TABS
5.0000 mg | ORAL_TABLET | Freq: Every day | ORAL | Status: DC
  Administered 2024-02-18 – 2024-03-12 (×27): 5 mg via ORAL
  Filled 2024-02-17 (×27): qty 1

## 2024-02-17 MED ORDER — ASPIRIN 81 MG PO TBEC
81.0000 mg | DELAYED_RELEASE_TABLET | Freq: Every day | ORAL | Status: DC
  Administered 2024-02-17 – 2024-03-13 (×29): 81 mg via ORAL
  Filled 2024-02-17 (×26): qty 1

## 2024-02-17 MED ORDER — ZIPRASIDONE MESYLATE 20 MG IM SOLR
20.0000 mg | Freq: Once | INTRAMUSCULAR | Status: AC
  Administered 2024-02-17: 20 mg via INTRAMUSCULAR
  Filled 2024-02-17: qty 20

## 2024-02-17 NOTE — ED Notes (Signed)
 Pt seen standing in his room holding his pillow and hitting it with a closed fist multiple times for a couple minutes. Pt then sat on side of his bed with no distress noted.

## 2024-02-17 NOTE — ED Notes (Signed)
 Pt was calm and cooperative while receiving IM medication.

## 2024-02-17 NOTE — ED Notes (Signed)
 Patient reports feeling very anxious.  ED provider made aware for possible prn med.

## 2024-02-17 NOTE — ED Notes (Signed)
 [facility] received a call from Sari Kerns ([facility] / DSS Supervisor - [phone]).  Sari stated that DSS has been unsuccessful with finding placement for patient.  DSS is still working on finding the right locked facility placement but need more time.  For this reason, [facility] is unable to pick up patient.  [facility] updated Dr. Donnelly and Alfonso RN.  A TOC order will be placed for placement.  [place], [facility] [phone]

## 2024-02-17 NOTE — ED Notes (Signed)
 Patient given snack. No acute needs at this time.

## 2024-02-17 NOTE — ED Notes (Signed)
 Patient observed throwing empty cup across room, then stomped on it.  Patient then standing at nsg window yelling fuck you.

## 2024-02-17 NOTE — ED Notes (Addendum)
 [facility] spoke to Clemet [phone] -[facility]) to report patient is psych cleared for discharge. Clemet stated patient is no longer able to return to group home.  Patient is considered a danger to himself and wanders away from the facility.  Rosana states he has been working with [name] of DSS [phone]) since July in an effort to find another placement for patient. Patient has attempted elopement from now 3 facilities. Rosana expressed to DSS that patient is a risk to himself and need to be in a locked facility.   [facility] contacted legal guardian, Coolidge Estrin [phone]) and left a HIPAA compliant voicemail.  [facility] also spoke to [name] [phone]) who is speaking with her manager to determine immediate lock facility placement.  [facility] is awaiting an update from [place].   [place], [facility] [phone]

## 2024-02-17 NOTE — ED Notes (Signed)
 Patient yelling and cursing at security staff.

## 2024-02-17 NOTE — ED Notes (Signed)
 Upon intentional rounding, this tech heard pt yell out and smacking sounds. This tech went to check on pt. Pt stated I'm hearing voices. They keep telling me about an iPad and I only have 5 seconds to get to it, so I hit myself in the face. Pt denies SI/HI, RN made aware.

## 2024-02-17 NOTE — ED Notes (Signed)
 This RN advised MD Ward of self harming behavior r/t reported auditory hallucinations. Pt is laying in bed calmly requesting medication to manage symptoms.

## 2024-02-17 NOTE — ED Notes (Signed)
 Patient observed, taking paper trash bag from the day room and emptying all the trash on to the floor while yelling.

## 2024-02-17 NOTE — ED Notes (Signed)
 Patient given IM medications willing without need for security to hold.

## 2024-02-18 MED ORDER — ARIPIPRAZOLE ER 400 MG IM SRER: 400.0000 mg | Freq: Once | INTRAMUSCULAR | Status: DC

## 2024-02-18 MED ORDER — LORAZEPAM 2 MG PO TABS
2.0000 mg | ORAL_TABLET | Freq: Four times a day (QID) | ORAL | Status: DC | PRN
  Administered 2024-02-18 – 2024-03-07 (×17): 2 mg via ORAL
  Filled 2024-02-18 (×16): qty 1

## 2024-02-18 MED ORDER — LORAZEPAM 2 MG/ML IJ SOLN
2.0000 mg | Freq: Four times a day (QID) | INTRAMUSCULAR | Status: DC | PRN
  Administered 2024-02-21 – 2024-02-25 (×3): 2 mg via INTRAMUSCULAR
  Filled 2024-02-18 (×2): qty 1

## 2024-02-18 MED ORDER — ARIPIPRAZOLE ER 400 MG IM SRER
400.0000 mg | Freq: Once | INTRAMUSCULAR | Status: AC
  Administered 2024-02-19: 400 mg via INTRAMUSCULAR
  Filled 2024-02-18: qty 2

## 2024-02-18 NOTE — ED Notes (Signed)
 Pt was provided a lunch tray

## 2024-02-18 NOTE — ED Notes (Signed)
Pt was provided breakfast at bedside.

## 2024-02-18 NOTE — ED Notes (Signed)
 Patient observed sitting in dayroom hitting self on the side of the face and head.

## 2024-02-18 NOTE — ED Notes (Addendum)
 Pt observed hitting himself on the face and punching his bed. Staff went to rediret pt form this behavior and was talking to him but pt just yelled out to security get out. I don't want you to kill me. Security was not even near the pt.  Pt reassured that staff is here to help him and no one is trying to hurt him. Pt did agree to take Ativan  PO PRN per orders. Monitoring continues for safety.

## 2024-02-18 NOTE — TOC CM/SW Note (Signed)
..  Transition of Care [facility]) - Inpatient Brief Assessment   Patient Details  Name: Derrick Atkins MRN: [id number] Date of Birth: 30-Jul-1970  Transition of [facility]) CM/SW Contact:    [name], LCSW Phone Number: 02/18/2024, 10:35 AM   Clinical Narrative:  SW reviewed pt chart.  SW securely messaged DSS staff and [place] hospital staff regarding this pt. Pt is presumed difficult to place.  SW asking if respite is an option for this pt and if list of facilities that have declined placement can be provided.  Waiting on response.   Transition of Care Asessment:

## 2024-02-18 NOTE — ED Notes (Signed)
Pt was provided a dinner tray.

## 2024-02-18 NOTE — ED Provider Notes (Signed)
   [facility] Observation Note   ----------------------------------------- 9:45 AM on 02/18/2024 -----------------------------------------  Derrick Atkins is a 53 y.o. male currently boarding in the Emergency Department.  No acute events since last update.  Recent Vitals   Most recent vital signs: Vitals:   02/17/24 2206 02/18/24 0932  BP: 136/69 128/68  Pulse: 80 65  Resp: 15 18  Temp: 97.9 F (36.6 C) 98.6 F (37 C)  SpO2: 98% 100%    ED Results / Procedures / Treatments   Labs (all labs ordered are listed, but only abnormal results are displayed) Labs Reviewed  COMPREHENSIVE METABOLIC PANEL WITH GFR - Abnormal; Notable for the following components:      Result Value   Glucose, Bld 127 (*)    All other components within normal limits  CBC - Abnormal; Notable for the following components:   Hemoglobin 12.8 (*)    HCT 38.7 (*)    Platelets 143 (*)    All other components within normal limits  URINE DRUG SCREEN, QUALITATIVE ([facility] ONLY) - Abnormal; Notable for the following components:   MDMA (Ecstasy)Ur Screen POSITIVE (*)    All other components within normal limits  ETHANOL    MEDICATIONS ORDERED IN ED: Medications  aspirin  EC tablet 81 mg (81 mg Oral Given 02/17/24 2157)  benztropine  (COGENTIN ) tablet 1 mg (1 mg Oral Given 02/17/24 2157)  fenofibrate  tablet 54 mg (has no administration in time range)  cyanocobalamin  (VITAMIN B12) tablet 1,000 mcg (has no administration in time range)  cholecalciferol  (VITAMIN D3) 25 MCG (1000 UNIT) tablet 1,000 Units (has no administration in time range)  FLUoxetine  (PROZAC ) capsule 20 mg (20 mg Oral Given 02/17/24 2157)  levothyroxine  (SYNTHROID ) tablet 88 mcg (88 mcg Oral Given 02/18/24 0530)  iron  polysaccharides (NIFEREX) capsule 150 mg (has no administration in time range)  folic acid  (FOLVITE ) tablet 1 mg (has no administration in time range)  metoprolol  tartrate (LOPRESSOR ) tablet 25 mg (25 mg Oral  Given 02/17/24 2157)  OLANZapine  (ZYPREXA ) tablet 15 mg (15 mg Oral Given 02/17/24 2158)  pantoprazole  (PROTONIX ) EC tablet 40 mg (has no administration in time range)  potassium chloride  SA (KLOR-CON  M) CR tablet 20 mEq (has no administration in time range)  rosuvastatin  (CRESTOR ) tablet 5 mg (has no administration in time range)  traZODone  (DESYREL ) tablet 50 mg (50 mg Oral Given 02/17/24 2158)  LORazepam  (ATIVAN ) tablet 2 mg (has no administration in time range)    Or  LORazepam  (ATIVAN ) injection 2 mg (has no administration in time range)  ziprasidone  (GEODON ) injection 20 mg (20 mg Intramuscular Given 02/17/24 0459)  ziprasidone  (GEODON ) injection 20 mg (20 mg Intramuscular Given 02/17/24 2236)     ED Plan   Currently awaiting placement into an appropriate living facility.  Social work is working with the patient to help achieve this.      Dorothyann Drivers, MD 02/18/24 [phone]

## 2024-02-18 NOTE — ED Notes (Signed)
Snack was given. 

## 2024-02-18 NOTE — ED Notes (Signed)
Snack was given to pt. 

## 2024-02-18 NOTE — ED Notes (Signed)
 Gave pt water per pt request at this time.

## 2024-02-18 NOTE — ED Notes (Addendum)
 Vitals where completed.

## 2024-02-19 NOTE — ED Notes (Signed)
 Pt given breakfast tray and juice.

## 2024-02-19 NOTE — ED Notes (Signed)
 Pt up to restroom.

## 2024-02-19 NOTE — ED Notes (Signed)
VOL/pending placement 

## 2024-02-19 NOTE — ED Notes (Signed)
Provided pt with shower supplies.

## 2024-02-19 NOTE — ED Notes (Signed)
 This EDT gave the pt a pack of graham crackers.

## 2024-02-19 NOTE — ED Provider Notes (Signed)
 Emergency Medicine Observation Re-evaluation Note  Derrick Atkins is a 53 y.o. male, seen on rounds today.  Pt initially presented to the ED for complaints of Psychiatric Evaluation  Currently, the patient is resting  Physical Exam  Blood pressure (!) 141/67, pulse 71, temperature 97.6 F (36.4 C), temperature source Oral, resp. rate 17, SpO2 98%.  Physical Exam General: No apparent distress Pulm: Normal WOB Psych: resting     ED Course / MDM   Clinical Course as of 02/19/24 0739  Thu Feb 16, 2024  1338 CBC with mild stable anemia, leukocytosis  CMP reviewed, unremarkable  EtOH negative  UDS pending [MM]    Clinical Course User Index [MM] Clarine Ozell LABOR, MD    I have reviewed the labs performed to date as well as medications administered while in observation.  Recent changes in the last 24 hours include none   Plan   Current plan is to continue to wait for placement  Patient is not under full IVC at this time.   Derrick Ronal BRAVO, MD 02/19/24 [phone]

## 2024-02-19 NOTE — ED Notes (Signed)
 This EDT gave the pt orange sherbet ice cream and water as their snack.

## 2024-02-19 NOTE — ED Notes (Signed)
 Hospital meal provided, pt tolerated w/o complaints.  Waste discarded appropriately.

## 2024-02-19 NOTE — ED Notes (Addendum)
 This EDT gave the pt mini wheat's cereal with low fat milk.

## 2024-02-19 NOTE — ED Notes (Signed)
Patient is vol pending placement 

## 2024-02-19 NOTE — ED Notes (Signed)
 Patient up to nsg station states that he had been hitting himself and was starting to feel anxious/agitated again.  This RN to check prn medications.

## 2024-02-19 NOTE — ED Notes (Signed)
Lunch tray and drink provided to pt 

## 2024-02-19 NOTE — ED Notes (Signed)
 Pt stated he was feeling anxious and requesting his prn medication. See MAR.

## 2024-02-19 NOTE — ED Notes (Addendum)
 Pt taking shower. Pt was given hygiene items and the following, 1 clean top, 1 clean bottom, with 1 pair of disposable underwear.  Pt changed out into clean clothing.  Staff disposed of all shower supplies.

## 2024-02-20 ENCOUNTER — Emergency Department

## 2024-02-20 LAB — BASIC METABOLIC PANEL WITH GFR
Anion gap: 11 (ref 5–15)
BUN: 18 mg/dL (ref 6–20)
CO2: 25 mmol/L (ref 22–32)
Calcium: 9.7 mg/dL (ref 8.9–10.3)
Chloride: 104 mmol/L (ref 98–111)
Creatinine, Ser: 0.84 mg/dL (ref 0.61–1.24)
GFR, Estimated: >60 mL/min (ref >60)
Glucose, Bld: 205 mg/dL — ABNORMAL HIGH (ref 70–99)
Potassium: 4.2 mmol/L (ref 3.5–5.1)
Sodium: 140 mmol/L (ref 135–145)

## 2024-02-20 LAB — TROPONIN I (HIGH SENSITIVITY)
Troponin I (High Sensitivity): 4 ng/L (ref <18)
Troponin I (High Sensitivity): 5 ng/L (ref <18)

## 2024-02-20 LAB — CBC
HCT: 39.9 % (ref 39.0–52.0)
Hemoglobin: 13 g/dL (ref 13.0–17.0)
MCH: 27.9 pg (ref 26.0–34.0)
MCHC: 32.6 g/dL (ref 30.0–36.0)
MCV: 85.6 fL (ref 80.0–100.0)
Platelets: 153 K/uL (ref 150–400)
RBC: 4.66 MIL/uL (ref 4.22–5.81)
RDW: 13.2 % (ref 11.5–15.5)
WBC: 7.4 K/uL (ref 4.0–10.5)
nRBC: 0 % (ref 0.0–0.2)

## 2024-02-20 LAB — D-DIMER, QUANTITATIVE: D-Dimer, Quant: 0.35 ug{FEU}/mL (ref 0.00–0.50)

## 2024-02-20 NOTE — ED Provider Notes (Signed)
 9:59 PM Assumed care for off going team.   Blood pressure 138/67, pulse 66, temperature 98.3 F (36.8 C), temperature source Oral, resp. rate 18, SpO2 100%.  See their HPI for full report but in brief pedning blood work.    Trop negative Bmp nromal  Cbc normal  Ddimer negative Trop negative x2  IMPRESSION: No right lower extremity DVT.  Pt medically cleared    Ernest Ronal BRAVO, MD 02/20/24 2315

## 2024-02-20 NOTE — ED Notes (Signed)
 Pt was provided with a lunch tray

## 2024-02-20 NOTE — ED Notes (Signed)
 Pt reported to another patient on the unit that he was not feeling well and asked her to get the nurse. Pt sitting up in dayroom at the table. Pt had eaten his lunch and currently has no increased work of breathing or distress noted. Skin warm and dry. VS assessed by ED tech and MD notified.

## 2024-02-20 NOTE — ED Notes (Signed)
VOL/Pending Placement 

## 2024-02-20 NOTE — [place] FL2 (Addendum)
 [place]  MEDICAID FL2 LEVEL OF CARE FORM     IDENTIFICATION  Patient Name: Derrick Atkins Birthdate: Jun 22, 1971 Sex: male Admission Date (Current Location): 02/16/2024  [facility] and [place] Number:  Chiropodist and Address:  [facility], [address], [place], [place] [address]      Provider Number: [id number]  Attending Physician Name and Address:  No att. providers found  Relative Name and Phone Number:  [name] [phone] or Sentrell Allen-Byrd  [phone]    Current Level of Care: Hospital Recommended Level of Care: Nursing Facility, Skilled Nursing Facility, Assisted Living Facility, Family Care Home Prior Approval Number:    Date Approved/Denied:   PASRR Number:    Discharge Plan: Other (Comment) (Nursing facility, skilled nursing facility, alf, fch, group home)    Current Diagnoses: Patient Active Problem List   Diagnosis Date Noted   Suicidal ideation 12/18/2023   Schizophrenia, chronic condition with acute exacerbation (HCC) 12/18/2023   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   Intractable nausea and vomiting 05/25/2023   Leukopenia 05/25/2023   Right sided weakness 05/25/2023   Acute encephalopathy 05/25/2023   Altered mental status 04/12/2023   Leg swelling 04/12/2023   Ambulatory dysfunction 02/26/2023   DM2 (diabetes mellitus, type 2) (HCC) 02/26/2023   Hyperlipidemia 02/26/2023   Prolonged QT interval 02/26/2023   Normocytic anemia 02/26/2023   Thrombocytopenia (HCC) 02/21/2023   Rhabdomyolysis 02/21/2023   Hypoglycemia 02/21/2023   Abnormal LFTs 02/21/2023   Fall at home, initial encounter 02/21/2023   Cellulitis of left lower extremity 01/06/2023   Iron  deficiency anemia 01/06/2023   Cellulitis and abscess of left lower extremity 01/06/2023   Nausea & vomiting 01/06/2023   AKI (acute kidney injury) (HCC) 12/13/2022   Dyslipidemia 12/13/2022    Schizophrenia (HCC) 03/23/2022   Aggressive behavior    Schizophrenia, paranoid, chronic (HCC)    Hypothyroidism 12/25/2013   Essential hypertension 05/03/2007   Type 2 diabetes mellitus without complications (HCC) 05/03/2007    Orientation RESPIRATION BLADDER Height & Weight     Self  Normal Continent Weight:   Height:     BEHAVIORAL SYMPTOMS/MOOD NEUROLOGICAL BOWEL NUTRITION STATUS      Continent Diet  AMBULATORY STATUS COMMUNICATION OF NEEDS Skin   Independent Verbally Normal                       Personal Care Assistance Level of Assistance  Bathing, Feeding, Dressing Bathing Assistance: Independent Feeding assistance: Independent Dressing Assistance: Independent     Functional Limitations Info  Sight, Hearing, Speech Sight Info: Adequate Hearing Info: Adequate Speech Info: Adequate    SPECIAL CARE FACTORS FREQUENCY                       Contractures Contractures Info: Not present    Additional Factors Info  Code Status, Allergies Code Status Info: Full Allergies Info: Valproic Acid  And Related  Ondansetron   Phenytoin Sodium Extended  Prednisone  Latex           Current Medications (02/20/2024):  This is the current hospital active medication list Current Facility-Administered Medications  Medication Dose Route Frequency Provider Last Rate Last Admin   aspirin  EC tablet 81 mg  81 mg Oral Daily Kinner, Robert, MD   81 mg at 02/20/24 1047   benztropine  (COGENTIN ) tablet 1 mg  1 mg Oral Daily Kinner, Robert, MD   1 mg at 02/20/24 1047  cholecalciferol  (VITAMIN D3) 25 MCG (1000 UNIT) tablet 1,000 Units  1,000 Units Oral Daily [name], MD   1,000 Units at 02/20/24 1047   cyanocobalamin  (VITAMIN B12) tablet 1,000 mcg  1,000 mcg Oral Daily Kinner, Robert, MD   1,000 mcg at 02/20/24 1047   fenofibrate  tablet 54 mg  54 mg Oral Daily [name], MD   54 mg at 02/20/24 1048   FLUoxetine  (PROZAC ) capsule 20 mg  20 mg Oral Daily Kinner, Robert, MD    20 mg at 02/20/24 1047   folic acid  (FOLVITE ) tablet 1 mg  1 mg Oral Daily Kinner, Robert, MD   1 mg at 02/20/24 1047   iron  polysaccharides (NIFEREX) capsule 150 mg  150 mg Oral Daily Kinner, Robert, MD   150 mg at 02/20/24 1048   levothyroxine  (SYNTHROID ) tablet 88 mcg  88 mcg Oral Q0600 [name], MD   88 mcg at 02/20/24 9385   LORazepam  (ATIVAN ) tablet 2 mg  2 mg Oral Q6H PRN Paduchowski, Kevin, MD   2 mg at 02/19/24 1935   Or   LORazepam  (ATIVAN ) injection 2 mg  2 mg Intramuscular Q6H PRN Paduchowski, Kevin, MD       metoprolol  tartrate (LOPRESSOR ) tablet 25 mg  25 mg Oral BID Kinner, Robert, MD   25 mg at 02/20/24 1048   OLANZapine  (ZYPREXA ) tablet 15 mg  15 mg Oral QHS Kinner, Robert, MD   15 mg at 02/19/24 2129   pantoprazole  (PROTONIX ) EC tablet 40 mg  40 mg Oral QAC breakfast [name], MD   40 mg at 02/20/24 1047   potassium chloride  SA (KLOR-CON  M) CR tablet 20 mEq  20 mEq Oral Daily [name], MD   20 mEq at 02/20/24 1048   rosuvastatin  (CRESTOR ) tablet 5 mg  5 mg Oral Daily Kinner, Robert, MD   5 mg at 02/20/24 1048   traZODone  (DESYREL ) tablet 50 mg  50 mg Oral QHS Kinner, Robert, MD   50 mg at 02/19/24 2129   Current Outpatient Medications  Medication Sig Dispense Refill   ABILIFY  MAINTENA 400 MG PRSY prefilled syringe Inject 400 mg into the muscle every 28 (twenty-eight) days.     albuterol  (VENTOLIN  HFA) 108 (90 Base) MCG/ACT inhaler Inhale 2 puffs into the lungs every 4 (four) hours as needed for shortness of breath or wheezing. 17 each 0   ascorbic acid  (VITAMIN C ) 500 MG tablet Take 1 tablet (500 mg total) by mouth daily. 30 tablet 3   aspirin  EC 81 MG tablet Take 1 tablet (81 mg total) by mouth daily. Swallow whole. 30 tablet 12   benztropine  (COGENTIN ) 1 MG tablet Take 1 tablet (1 mg total) by mouth daily. 30 tablet 0   cholecalciferol  (VITAMIN D3) 25 MCG (1000 UNIT) tablet Take 1 tablet (1,000 Units total) by mouth daily. 30 tablet 0   cyanocobalamin   (VITAMIN B12) 1000 MCG tablet Take 1 tablet (1,000 mcg total) by mouth daily. 30 tablet 0   fenofibrate  54 MG tablet Take 1 tablet (54 mg total) by mouth daily. 30 tablet 0   FLUoxetine  (PROZAC ) 20 MG capsule Take 1 capsule (20 mg total) by mouth daily. 30 capsule 0   folic acid  (FOLVITE ) 1 MG tablet Take 1 tablet (1 mg total) by mouth daily. 30 tablet 0   iron  polysaccharides (NIFEREX) 150 MG capsule Take 1 capsule (150 mg total) by mouth daily. 30 capsule 0   levothyroxine  (SYNTHROID ) 88 MCG tablet Take 1 tablet (88 mcg  total) by mouth daily at 6 (six) AM. 30 tablet 0   metoprolol  tartrate (LOPRESSOR ) 25 MG tablet Take 1 tablet (25 mg total) by mouth 2 (two) times daily. 30 tablet 0   OLANZapine  (ZYPREXA ) 15 MG tablet Take 1 tablet (15 mg total) by mouth at bedtime. 30 tablet 0   pantoprazole  (PROTONIX ) 40 MG tablet Take 1 tablet (40 mg total) by mouth daily before breakfast. 30 tablet 0   potassium chloride  SA (KLOR-CON  M) 20 MEQ tablet Take 1 tablet (20 mEq total) by mouth daily. 30 tablet 0   rosuvastatin  (CRESTOR ) 5 MG tablet Take 1 tablet (5 mg total) by mouth daily. 30 tablet 0   senna (SENOKOT) 8.6 MG TABS tablet Take 2 tablets by mouth at bedtime.     traZODone  (DESYREL ) 50 MG tablet Take 1 tablet (50 mg total) by mouth at bedtime. 30 tablet 0   LINZESS  290 MCG CAPS capsule Take 1 capsule (290 mcg total) by mouth daily. 30 capsule 0     Discharge Medications: Please see discharge summary for a list of discharge medications.  Relevant Imaging Results:  Relevant Lab Results:   Additional Information SS #: [id number]  DOB: 1971-04-15  [name], LCSW

## 2024-02-20 NOTE — ED Notes (Signed)
Pt was provided with a snack.

## 2024-02-20 NOTE — ED Notes (Signed)
 VOL TOC placement

## 2024-02-20 NOTE — ED Notes (Signed)
 DSS with pt in day room for interview. No distress noted at this time. Pt cooperative.

## 2024-02-20 NOTE — ED Notes (Signed)
 The pt is sitting in the dayroom slapping himself in the face and head.

## 2024-02-20 NOTE — Progress Notes (Signed)
 Received call from [name] with DSS regarding placement. She stated [facility] is interested and needs the following information: Fl2, medication list and psych eval. [place] with TOC made aware.

## 2024-02-20 NOTE — TOC CM/SW Note (Addendum)
..  Transition of Care [facility]) - Inpatient Brief Assessment   Patient Details  Name: Derrick Atkins MRN: [id number] Date of Birth: [birth year]  Transition of [facility]) CM/SW Contact:    [name], LCSW Phone Number: 02/20/2024, 3:11 PM   Clinical Narrative:  SW started FL2.  PASRR pending possibly 5 days.  SW notified [name] with DSS  4:32pm- sw faxed info to [facility] at [phone] because sw not able to attached files in chart in [facility]  Transition of Care Asessment:

## 2024-02-20 NOTE — ED Notes (Signed)
 Patient appears not to be in any distress. Patient refusing to speak with Clinical research associate. Will continue to monitor.  ENVIRONMENTAL ASSESSMENT Potentially harmful objects out of patient reach: Yes.   Personal belongings secured: Yes.   Patient dressed in hospital provided attire only: Yes.   Plastic bags out of patient reach: Yes.   Patient care equipment (cords, cables, call bells, lines, and drains) shortened, removed, or accounted for: Yes.   Equipment and supplies removed from bottom of stretcher: Yes.   Potentially toxic materials out of patient reach: Yes.   Sharps container removed or out of patient reach: Yes.

## 2024-02-20 NOTE — ED Notes (Signed)
 Vitals are complete.

## 2024-02-20 NOTE — TOC CM/SW Note (Signed)
..  Transition of Care [facility]) - Inpatient Brief Assessment   Patient Details  Name: Derrick Atkins MRN: [id number] Date of Birth: [birth year]  [facility]) CM/SW Contact:    [name], LCSW Phone Number: 02/20/2024, 4:51 PM   Clinical Narrative:  Meeting was held today with DSS to discuss pt and placement.  [facility] to complete assessment this week.  LME is involved with pt as well.   Transition of Care Asessment:

## 2024-02-20 NOTE — ED Provider Notes (Signed)
 Emergency Medicine Observation Re-evaluation Note  KITO CUFFE is a 53 y.o. male, seen on rounds today.  Pt initially presented to the ED for complaints of Psychiatric Evaluation  Currently, the patient is ambulating in the room, no distress  Physical Exam   Vitals:   02/19/24 1926 02/19/24 2129  BP: (!) 143/75 (!) 143/75  Pulse: 79 79  Resp: 16   Temp: 97.8 F (36.6 C)   SpO2: 98%     Physical Exam General: No apparent distress Pulm: Normal WOB  Ambulatory in day room without distress     ED Course / MDM     I have reviewed the labs performed to date as well as medications administered while in observation.  Recent changes in the last 24 hours include none   Plan   Current plan is to continue to wait for placement  Patient is not under full IVC at this time.     [name], MD 02/20/24 [phone]

## 2024-02-20 NOTE — ED Notes (Signed)
 Pt given snack and drink at this time. Pt calm and cooperative.

## 2024-02-20 NOTE — ED Notes (Signed)
 Writer has asked patient multiple times to go to room.  Patient continues to pace around dayroom and looking into other patients' rooms. Writer moved other patients' door flaps up so patient cannot look into rooms.

## 2024-02-20 NOTE — ED Notes (Signed)
 Dinner was given at the bedside.

## 2024-02-20 NOTE — ED Notes (Signed)
Snack was given. 

## 2024-02-20 NOTE — ED Notes (Signed)
 Breakfast tray was provided at bedside.

## 2024-02-21 NOTE — ED Notes (Signed)
 Hospital meal provided, pt tolerated w/o complaints.  Waste discarded appropriately.

## 2024-02-21 NOTE — ED Notes (Signed)
 Patient was offered PO ativan  due to agitation.  Patient refused and smacked cup out of writers hand. Security called and IM ativan  given at [employer]. Security held patient for writer to administered medication in right deltoid. Patient was released as soon as IM was given.

## 2024-02-21 NOTE — ED Provider Notes (Signed)
 Emergency Medicine Observation Re-evaluation Note  STEPEHN Atkins is a 53 y.o. male, seen on rounds today.  Pt initially presented to the ED for complaints of Psychiatric Evaluation  Currently, the patient is is no acute distress. Denies any concerns at this time.  Physical Exam  Blood pressure (!) 144/82, pulse 88, temperature 98 F (36.7 C), temperature source Oral, resp. rate 20, SpO2 100%.  Physical Exam: General: No apparent distress Pulm: Normal WOB Neuro: Moving all extremities Psych: Resting comfortably     ED Course / MDM   Clinical Course as of 02/21/24 1343  Thu Feb 16, 2024  1338 CBC with mild stable anemia, leukocytosis  CMP reviewed, unremarkable  EtOH negative  UDS pending [MM]    Clinical Course User Index [MM] Clarine Ozell LABOR, MD    I have reviewed the labs performed to date as well as medications administered while in observation.  Recent changes in the last 24 hours include: No acute events overnight.  Plan   Current plan: Patient awaiting placement -social work helping to facilitate Patient is not under full IVC at this time.    Suzanne Kirsch, MD 02/21/24 1343

## 2024-02-21 NOTE — ED Notes (Signed)
 Vol/TOC pending placement

## 2024-02-21 NOTE — ED Notes (Signed)
 Patient appears agitated, pacing floors, walks to sally port attempting to open door. This RN offered PO ativan  to try to deescalate patient further, patient refused. This RN offered shot or pill. Patient states fuck a shot and presents me with his middle finger. New patient arrived for room 2 and Mr. Flett stood blocking hallway and refused to go to his room when asked. Security called for backup and accompanied patient back to his room.  Report given to [employer]

## 2024-02-21 NOTE — ED Notes (Signed)
 Nurse provided the patient with a pm snack

## 2024-02-21 NOTE — ED Notes (Signed)
 [place] DSS returned call. And Clinical research associate informed that patient was placed in manual hold to receive IM medication.

## 2024-02-21 NOTE — ED Notes (Signed)
VOL/TOC Pending Placement

## 2024-02-21 NOTE — ED Notes (Addendum)
 Writer attempted to call Legal Guardian [name].  HIPAA compliant message left for return call.  Writer also called [place] DSS and awaiting return call.

## 2024-02-21 NOTE — ED Notes (Signed)
 Patient moved to locked area due to behaviors. Patient went to area willingly with no issues. Security called for Production assistant, radio and patient.

## 2024-02-21 NOTE — ED Notes (Signed)
 Patient found eating food from trash can by [facility] NT. [place] can has been removed from dayroom.

## 2024-02-21 NOTE — ED Notes (Signed)
 Assumed care of patient, pt resting in bed with eyes shut, respirations even and unlabored, no distress noted

## 2024-02-22 NOTE — ED Provider Notes (Signed)
 Emergency Medicine Observation Re-evaluation Note  Derrick Atkins is a 53 y.o. male, seen on rounds today.  Pt initially presented to the ED for complaints of Psychiatric Evaluation  Currently, the patient is resting in bed. No reported issues from nursing team.   Physical Exam  BP (!) 130/90 (BP Location: Right Arm)   Pulse 78   Temp 98.1 F (36.7 C) (Oral)   Resp 16   SpO2 99%  Physical Exam General: Resting in bed  ED Course / MDM   No labs last 24 hours.  Plan  Current plan is for dispo per social work.    Levander Slate, MD 02/22/24 [phone]

## 2024-02-22 NOTE — ED Notes (Signed)
VOL/TOC Pending Placement

## 2024-02-22 NOTE — ED Notes (Signed)
 VOL/TOC PENDING PLACEMENT.

## 2024-02-22 NOTE — TOC CM/SW Note (Signed)
..  Transition of Care [facility]) - Inpatient Brief Assessment   Patient Details  Name: Derrick Atkins MRN: [id number] Date of Birth: Jul 02, 1971  [facility]) CM/SW Contact:    [name], LCSW Phone Number: 02/22/2024, 6:40 PM   Clinical Narrative:   SW check [facility] for update.  No update Transition of Care Asessment:

## 2024-02-22 NOTE — ED Notes (Signed)
 Breakfast tray and juice provided to patient.

## 2024-02-22 NOTE — ED Notes (Signed)
 Provided pt with shower supplies and clean scrubs.

## 2024-02-22 NOTE — Group Note (Deleted)
 Date:  02/22/2024 Time:  2:22 PM  Group Topic/Focus:  Wellness Toolbox:   The focus of this group is to discuss various aspects of wellness, balancing those aspects and exploring ways to increase the ability to experience wellness.  Patients will create a wellness toolbox for use upon discharge.     Participation Level:  {BHH PARTICIPATION OZCZO:77735}  Participation Quality:  {BHH PARTICIPATION QUALITY:22265}  Affect:  {BHH AFFECT:22266}  Cognitive:  {BHH COGNITIVE:22267}  Insight: {BHH Insight2:20797}  Engagement in Group:  {BHH ENGAGEMENT IN HMNLE:77731}  Modes of Intervention:  {BHH MODES OF INTERVENTION:22269}  Additional Comments:  ***  Derrick Atkins 02/22/2024, 2:22 PM

## 2024-02-22 NOTE — ED Notes (Signed)
 Patient received snack and water.

## 2024-02-22 NOTE — ED Notes (Signed)
 Patient asking questions about surgery on leg and needing another exam. Clearly delusional but behavior is appropriate. Patient states he feels anxious and has aggreed to PO ativan . PO ativan  admin per order.

## 2024-02-22 NOTE — ED Notes (Signed)
 Dinner tray provided for pt

## 2024-02-22 NOTE — ED Notes (Signed)
 Assumed care of patient, pt awake and sitting to side of bed, no distress noted

## 2024-02-22 NOTE — ED Notes (Signed)
 Patient allowed to dayroom to eat breakfast. Patient states I promise to be good. This RN had conversation with patient regarding good and bad behavior and the consequences of bad behavior.

## 2024-02-23 MED ORDER — IBUPROFEN 800 MG PO TABS
800.0000 mg | ORAL_TABLET | Freq: Once | ORAL | Status: AC
  Administered 2024-02-23: 800 mg via ORAL
  Filled 2024-02-23: qty 1

## 2024-02-23 NOTE — ED Provider Notes (Signed)
 Emergency Medicine Observation Re-evaluation Note  Derrick Atkins is a 53 y.o. male, seen on rounds today.  Pt initially presented to the ED for complaints of Psychiatric Evaluation  Currently, the patient is no acute distress. No isses per bhu nurse   Physical Exam  Blood pressure 118/74, pulse 82, temperature 98 F (36.7 C), temperature source Oral, resp. rate 18, SpO2 100%.  Physical Exam General: No apparent distress Pulm: Normal WOB Psych: resting     ED Course / MDM     I have reviewed the labs performed to date as well as medications administered while in observation.  Recent changes in the last 24 hours include prn ativan  given.  Plan   Current plan is to continue to wait for SW Patient is not under full IVC at this time.   Ernest Ronal BRAVO, MD 02/23/24 [phone]

## 2024-02-23 NOTE — ED Notes (Signed)
 Hospital meal provided, pt tolerated w/o complaints.  Waste discarded appropriately.

## 2024-02-23 NOTE — ED Notes (Signed)
 Patient reports abdominal pain. Patient had BM earlier this evening. Provider made aware.

## 2024-02-23 NOTE — TOC CM/SW Note (Signed)
..  Transition of Care [facility]) - Inpatient Brief Assessment   Patient Details  Name: ROSEMARY PENTECOST MRN: [id number] Date of Birth: September 01, 1970  Transition of Care [facility]) CM/SW Contact:    [name], LCSW Phone Number: 02/23/2024, 12:19 PM   Clinical Narrative:  SW securely emailed DSS pt FL2 for placement  Transition of Care Asessment:

## 2024-02-23 NOTE — ED Notes (Signed)
VOL/TOC Pending Placement

## 2024-02-23 NOTE — ED Notes (Signed)
 Patient C/O back pain R/T Scoliosis OTO of Advil  800 mg PO given. Patient in room appears confused asking if he was moving to second floor. Patient compliant with medication reassurance and support  given.

## 2024-02-23 NOTE — ED Notes (Signed)
Report received from RN including SBAR. Patient alert and oriented, warm and dry, and in no acute distress. Patient denies SI, HI, AVH and pain. Patient made aware of Q15 minute rounds and Rover and Officer presence for their safety. Patient instructed to come to this nurse with needs or concerns. 

## 2024-02-24 MED ORDER — LIDOCAINE 5 % EX PTCH
1.0000 | MEDICATED_PATCH | CUTANEOUS | Status: DC
  Administered 2024-02-24 – 2024-03-13 (×19): 1 via TRANSDERMAL
  Filled 2024-02-24 (×20): qty 1

## 2024-02-24 NOTE — ED Notes (Signed)
 Snacks provided to pt

## 2024-02-24 NOTE — Progress Notes (Signed)
   02/24/24 1500  Spiritual Encounters  Type of Visit Initial  Care provided to: Patient  Conversation partners present during encounter Nurse  Reason for visit Routine spiritual support  OnCall Visit Yes   Chaplain visited patient and provided a compassionate presence and reflective listening.  Patient shared that he's looking forward to moving into his group home.  Patient was joyful as he spoke of his niece and nephew, video games and music.    Rev. Rana M. Nicholaus, M.Div. Chaplain Resident [facility]

## 2024-02-24 NOTE — ED Notes (Addendum)
 Patient awake unable to sleep PRN Ativan  2 mg given. Patient required reassurance to go back to his room for sleep. Will continue to monitor.

## 2024-02-24 NOTE — ED Notes (Signed)
 Lunch tray provided to pt.

## 2024-02-24 NOTE — ED Notes (Signed)
 Pt sitting in dayroom with other patients at this time

## 2024-02-24 NOTE — ED Provider Notes (Signed)
 Emergency Medicine Observation Re-evaluation Note  DELORIS MITTAG is a 53 y.o. male, seen on rounds today.  Pt initially presented to the ED for complaints of Psychiatric Evaluation  Currently, the patient is no acute distress; Some mild low back pain secondary to his known scoliosis. Baseline pain for pt. Got ibuprofen  yesterday for it. No issues per bhu nurse   Physical Exam  Blood pressure 133/63, pulse 76, temperature 97.6 F (36.4 C), temperature source Oral, resp. rate 18, SpO2 100%.  Physical Exam General: No apparent distress Pulm: Normal WOB Psych: resting     ED Course / MDM   Clinical Course as of 02/24/24 0711  Thu Feb 16, 2024  1338 CBC with mild stable anemia, leukocytosis  CMP reviewed, unremarkable  EtOH negative  UDS pending [MM]    Clinical Course User Index [MM] Clarine Ozell LABOR, MD    I have reviewed the labs performed to date as well as medications administered while in observation.  Recent changes in the last 24 hours include prn ativan  to help with sleep last night.  Plan   Current plan is to continue to wait for SW Patient is not under full IVC at this time.   Ernest Ronal BRAVO, MD 02/24/24 [phone]

## 2024-02-24 NOTE — ED Notes (Signed)
 Dinner tray provided to pt

## 2024-02-24 NOTE — TOC CM/SW Note (Signed)
..  Transition of Care [facility]) - Inpatient Brief Assessment   Patient Details  Name: Derrick Atkins MRN: [id number] Date of Birth: 1970/07/16  Transition of [facility]) CM/SW Contact:    [name], LCSW Phone Number: 02/24/2024, 10:33 AM   Clinical Narrative:  SW received message from DSS that FL2 was received.  DSS will provid information to group home and notify SW when pt is ready for discharge.   Transition of Care Asessment:

## 2024-02-24 NOTE — ED Notes (Signed)
 Report given to [employer].

## 2024-02-25 NOTE — ED Notes (Signed)
 Patient ambulatory to restroom at this time.  No complaints voiced

## 2024-02-25 NOTE — ED Notes (Signed)
 Patient is vol pending placement

## 2024-02-25 NOTE — ED Notes (Signed)
 Pt sitting in day room, got up from chair and walked to door holding up middle finger at this RN, then walked to room.  Pt shortly returned to door asking for lunch and was told it would be provided shortly.  Pt now sitting in day room and began to yell loudly go to hell.  This RN to unit and offered pt medications for anxiety.  Pt did not respond to RN despite several attempts to ascertain the reason for pt's outburst.  Again offered medications, but continues to ignore RN.

## 2024-02-25 NOTE — ED Notes (Signed)
 Pt to day room pacing and becoming more agitated.  Pt tipped over two chairs and returned to his room.  Pt again offered PRN medications orally and refuses.  Pt to be given IM medications for agitation.  See MAR for doses and times.

## 2024-02-25 NOTE — ED Notes (Signed)
 Breakfast tray provided, pt in day room eating at this time.

## 2024-02-25 NOTE — ED Notes (Signed)
 Snacks provided.

## 2024-02-25 NOTE — ED Notes (Signed)
 Pt calmer at this time.  Afternoon snack provided.

## 2024-02-25 NOTE — ED Provider Notes (Signed)
 Emergency Medicine Observation Re-evaluation Note  Derrick Atkins is a 53 y.o. male, seen on rounds today.  Pt initially presented to the ED for complaints of Psychiatric Evaluation  Currently in no distress.  Ambulating in his room.  Discussed with nurse Delon, no noted issues overnight.  He did receive Ativan  as needed yesterday evening  Physical Exam   Vitals:   02/24/24 0714 02/24/24 1454  BP: 121/87 (!) 141/70  Pulse: 95 74  Resp: 17 18  Temp: (!) 97.5 F (36.4 C) 97.6 F (36.4 C)  SpO2: 100% 100%     Physical Exam General: No apparent distress Pulm: Normal WOB Psych: Ambulating in his room without distress     ED Course / MDM     I have reviewed the labs performed to date (none last few days) as well as medications administered while in observation.  Recent changes in the last 24 hours include prn ativan  to help with sleep last night.  Plan   Current plan is to continue to wait for SW DSS will provid information to group home and notify SW when pt is ready for discharge.  Patient is not under full IVC at this time.     Derrick Anes, MD 02/25/24 [phone]

## 2024-02-25 NOTE — ED Notes (Signed)
 Patient given PRN medication for increasing anxiety.

## 2024-02-25 NOTE — ED Notes (Signed)
 Pt provided shower supplies, pt to shower at this time.

## 2024-02-25 NOTE — ED Notes (Signed)
 Pt provided lunch tray at this time

## 2024-02-25 NOTE — ED Notes (Signed)
 Morning snacks provided.

## 2024-02-25 NOTE — ED Notes (Signed)
 Report received, care of pt assumed.  Pt ambulatory in unit, no c/o at this time.

## 2024-02-25 NOTE — ED Notes (Signed)
 Patient ambulatory to restroom at this time.  Requesting new scrubs.  States his current scrubs are wet.  New scrubs provided

## 2024-02-26 ENCOUNTER — Emergency Department

## 2024-02-26 MED ORDER — ACETAMINOPHEN 325 MG PO TABS
650.0000 mg | ORAL_TABLET | Freq: Four times a day (QID) | ORAL | Status: AC | PRN
  Administered 2024-02-26 – 2024-03-03 (×4): 650 mg via ORAL
  Filled 2024-02-26 (×5): qty 2

## 2024-02-26 NOTE — ED Notes (Signed)
 Pt stated he was hearing voices but did not elaborate further when questioned, rn notified

## 2024-02-26 NOTE — ED Provider Notes (Signed)
 Emergency Medicine Observation Re-evaluation Note  Derrick Atkins is a 53 y.o. male, seen on rounds today.  Pt initially presented to the ED for complaints of Psychiatric Evaluation  Currently, the patient is resting comfortably.  Physical Exam  BP (!) 150/65   Pulse 68   Temp 97.6 F (36.4 C) (Oral)   Resp 12   SpO2 100%  General: No acute distress Cardiac: Well-perfused extremities Lungs: No respiratory distress Psych: Appropriate mood and affect  ED Course / MDM  EKG:EKG Interpretation Date/Time:  Monday February 20 2024 17:04:30 EDT Ventricular Rate:  63 PR Interval:  136 QRS Duration:  96 QT Interval:  436 QTC Calculation: 446 R Axis:   28  Text Interpretation: Normal sinus rhythm Possible Left atrial enlargement Left ventricular hypertrophy with repolarization abnormality ( Cornell product ) Abnormal ECG When compared with ECG of 18-Dec-2023 13:19, ST no longer depressed in Inferior leads ST elevation now present in Anterior leads T wave inversion no longer evident in Inferior leads Confirmed by UNCONFIRMED, DOCTOR ([id number]), editor Alex Slough [phone]) on 02/21/2024 7:08:29 AM  I have reviewed the labs performed to date as well as medications administered while in observation.  Recent changes in the last 24 hours include none.  Plan  Current plan is for placement.   Alleen Kehm K, MD 02/26/24 [phone]

## 2024-02-26 NOTE — ED Notes (Signed)
 Pt requesting an xray; states he thinks his ribs might be broken. Denies any falls or injures. Carlin Palin, MD notified.

## 2024-02-26 NOTE — ED Notes (Signed)
VOL/TOC Pending Placement

## 2024-02-26 NOTE — ED Notes (Signed)
 Provided pt with dinner tray, along with a drink.

## 2024-02-26 NOTE — ED Notes (Signed)
 Pt requesting water and sleep meds. States his meds completely wore off and he is now wide awake

## 2024-02-26 NOTE — ED Notes (Addendum)
 This RN messaged messaged Carlin Palin, MD to notify him of pt c/o of right rib pain. Pt states pain started 10-15 minutes; denies any injury or SOB. Respirations are even & unlabored.

## 2024-02-26 NOTE — ED Notes (Signed)
 Hospital meal provided, pt tolerated w/o complaints.  Waste discarded appropriately.

## 2024-02-26 NOTE — ED Notes (Signed)
Snack was provided.

## 2024-02-26 NOTE — ED Notes (Signed)
 Pt taking shower. Pt was given hygiene items and the following, 1 clean top, 1 clean bottom, with 1 pair of disposable underwear.  Pt changed out into clean clothing.  Staff disposed of all shower supplies.

## 2024-02-26 NOTE — ED Notes (Signed)
 Lunch tray provided to pt, along with drink.

## 2024-02-26 NOTE — ED Notes (Signed)
 Patient given snack. No acute needs at this time.

## 2024-02-27 MED ORDER — POLYETHYLENE GLYCOL 3350 17 G PO PACK
17.0000 g | PACK | Freq: Once | ORAL | Status: AC
  Administered 2024-02-27: 17 g via ORAL
  Filled 2024-02-27: qty 1

## 2024-02-27 MED ORDER — POLYETHYLENE GLYCOL 3350 17 G PO PACK: 17.0000 g | PACK | Freq: Every day | ORAL | Status: DC

## 2024-02-27 MED ORDER — DOCUSATE SODIUM 100 MG PO CAPS
100.0000 mg | ORAL_CAPSULE | Freq: Once | ORAL | Status: AC
  Administered 2024-02-27: 100 mg via ORAL
  Filled 2024-02-27: qty 1

## 2024-02-27 MED ORDER — SENNOSIDES-DOCUSATE SODIUM 8.6-50 MG PO TABS
1.0000 | ORAL_TABLET | Freq: Every evening | ORAL | Status: DC | PRN
  Filled 2024-02-27: qty 1

## 2024-02-27 NOTE — ED Notes (Signed)
 Pt provided with lunch tray.

## 2024-02-27 NOTE — ED Notes (Signed)
 Patient requesting something for constipation.  MD Willo made aware.

## 2024-02-27 NOTE — ED Notes (Signed)
 Pt up to bathroom, alert and calm. Cup of water given per his request.

## 2024-02-27 NOTE — ED Notes (Signed)
 Assumed care of pt, pt resting in bed with eyes shut, respirations even and unlabored, no distress noted

## 2024-02-27 NOTE — ED Notes (Signed)
 Pt has been calm and cooperative today during this shift.  He was independent in his personal hygiene and took as shower and changed his scrubs, he ate both meals independently and was pleasant toward the other patients.  Pt denies any pain and asked for miralax  due to feelings of constipation (LBM yesterday), medication was given.

## 2024-02-27 NOTE — ED Notes (Signed)
 Patient received dinner at this time.

## 2024-02-27 NOTE — ED Notes (Signed)
 Patient expressed frustration with being here and not having any updates.  Patient informed that social work and case management was actively working on finding him placement that would better serve his needs.  Patient verbalized understanding and thankful for update.

## 2024-02-27 NOTE — Progress Notes (Signed)
   02/27/24 0945  Spiritual Encounters  Type of Visit Follow up  Care provided to: Patient  Conversation partners present during encounter Nurse  Reason for visit Routine spiritual support  OnCall Visit No   Chaplain visited with patient to follow-up on how he's doing.  Patient shared some new information about his family system that influences him today.  Patient suggested he may have benefited from meds earlier in life but he was always applauded for not being a person who complained.  Patient shared about what a hard worker his father was and how he was a disciplinarian when patient misbehaved.  Patient confirmed he would benefit from more talk therapy, whether one on one or in group.  Being alone and feeling lonely are things the patient struggles with.  Patient is hoping his next care home offers more of that.  Chaplain offered a compassionate presence and reflective listening and celebrated patient for his self-awareness and vulnerability.     Rev. Rana M. Nicholaus, M.Div. Chaplain Resident [facility]

## 2024-02-27 NOTE — ED Notes (Signed)
 Pt is visiting with Chaplain

## 2024-02-27 NOTE — ED Notes (Signed)
 Pt is requesting Miralax . He reports that he has attempted to use the bathroom a few times without success.  When asked about last BM he states that this was yesterday.  EDP notified by direct message.

## 2024-02-27 NOTE — ED Notes (Signed)
 Pt given snack and have had vital signs taken.

## 2024-02-27 NOTE — ED Notes (Signed)
 Resident up to bathroom, asked this RN Are you with the [employer]?  This RN explained to pt that I am the nurse and the FBI was not present.  Pt expressed frustration with constant visions  stating that he was seeing movies on the wall and hearing voices. Pt television not on at this time.Pt expressed that he was having trouble sleeping.  Ativan  offered by this RN. Pt accepted.

## 2024-02-27 NOTE — ED Notes (Signed)
Pt provided with a snack and water

## 2024-02-27 NOTE — ED Provider Notes (Signed)
 Emergency Medicine Observation Re-evaluation Note  Derrick Atkins is a 53 y.o. male, seen on rounds today.  Pt initially presented to the ED for complaints of Psychiatric Evaluation  Currently, the patient is no acute distress. NO issues per bhu nurse   Physical Exam  Blood pressure (!) 141/68, pulse 65, temperature 97.8 F (36.6 C), temperature source Oral, resp. rate 15, SpO2 99%.  Physical Exam General: No apparent distress Pulm: Normal WOB      ED Course / MDM   I have reviewed the labs performed to date as well as medications administered while in observation.  Recent changes in the last 24 hours include  prn ativan  for some visions and hearing voices.  Plan   Current plan is to continue to wait for  SW Patient is not under full IVC at this time.   Derrick Ronal BRAVO, MD 02/27/24 [phone]

## 2024-02-27 NOTE — ED Notes (Signed)
 Miralax  given in 8oz cranberry juice and an additional cup of water given and po intake encouraged. Pt is calm and cooperative in day room

## 2024-02-28 NOTE — ED Notes (Signed)
VOL/TOC Pending Placement

## 2024-02-28 NOTE — ED Provider Notes (Signed)
 Emergency Medicine Observation Re-evaluation Note  Derrick Atkins is a 53 y.o. male, seen on rounds today.  Pt initially presented to the ED for complaints of Psychiatric Evaluation  Currently, the patient is in no distress. No reported issues from nursing team.   Physical Exam  BP 125/69 (BP Location: Left Arm)   Pulse 74   Temp (!) 97.5 F (36.4 C) (Oral)   Resp 18   SpO2 98%  Physical Exam General: No distress  ED Course / MDM   No labs last 24 hours.  Plan  Current plan is for dispo per social work.    Levander Slate, MD 02/28/24 [phone]

## 2024-02-28 NOTE — Progress Notes (Signed)
   02/28/24 1100  Spiritual Encounters  Type of Visit Follow up  Care provided to: Patient  Conversation partners present during encounter Nurse  Reason for visit Routine spiritual support  OnCall Visit Yes   Chaplain visited with patient to follow-up.  Patient shared he was in good spirits but was wondering about if he's doing enough for God and was questioning what he's called to do.  Patient shared it's hard for him to stay focused on his thoughts so, he asked the Chaplain if she could bring him a Bible.  Chaplain got permission to do that from staff and brought that back to the patient.  Patient shared that he likes passages from Proverbs about using wisdom.  Patient shared that his mother had gotten a degree in theology or Biblical Studies and how smart she was.  Chaplain offered a compassionate presence and reflective listening.  Rev. Rana M. Nicholaus, M.Div. Chaplain Resident [facility]

## 2024-02-28 NOTE — TOC CM/SW Note (Addendum)
..  Transition of Care [facility]) - Inpatient Brief Assessment   Patient Details  Name: Derrick Atkins MRN: [id number] Date of Birth: 04-Oct-1970  Transition of Care [facility]) CM/SW Contact:    [name], LCSW Phone Number: 02/28/2024, 11:17 AM   Clinical Narrative:  SW reached out to DSS for update.  Waiting on response  Response- Good morning, I sent his FL2 out on Friday to different facilities. I have about 2/3 facilities that are reviewing his information and are planning to get back with me tomorrow to set up a virtual assessment. I am actually calling additional facilities at this moment. I will let you know once I hear back. 3:48pm- SW received response I have attached Ms. Christiana Ogidi to the email.The facility is [facility] [phone]). I do not have an address for the exact facility he will potentially be placed at.Ms. Hobert, could you provide us  with some days/times that will work with you to complete the virtual assessment? 4:34pm-The address will be [address] [place] [address]. I am waiting on my QP . Will need for her to be present for this assessment /meeting. She will get back to us  as soon as she can. Thanks for your patience.     Transition of Care Asessment:

## 2024-02-28 NOTE — ED Notes (Signed)
 Patient up to bathroom

## 2024-02-28 NOTE — ED Notes (Signed)
 Assumed care of patient, pt sitting in dayroom, alert and cooperative, waiting for his breakfast. No distress noted

## 2024-02-28 NOTE — ED Notes (Signed)
 Patient having an angry episode with verbal outburst. Pt requesting something to eat. This RN went into dayroom and spoke with patient after patient made yelling comment why don't you feed us  food instead of sitting there eating food yourself. Pt was referring to [facility] NT who was eating her lunch (at 6:30 pm). This RN reminded patient that he has had three meals today plus two snacks (one was double portion) and he is due for his nighttime snack around 8 pm. Patient angry, loud voice, and states  I am made, I am hungry. Patient states you want me to just sit here and wait for 6 hours to eat again? This RN reminded patient that snack is in 2 hours and that his comment regarding staff member eating was not appropriate. This RN offered anxiety medication to patient, patient refused.

## 2024-02-28 NOTE — ED Notes (Signed)
 Patient up to restroom.

## 2024-02-28 NOTE — ED Notes (Signed)
 Pt requested shower; provided clean hospital clothing. Sower setup provided with soap shampoo, toothbrush/toothpaste, and deodorant. Pt able to perform own ADL's with no assistance.

## 2024-02-28 NOTE — ED Notes (Signed)
 Patient currently sitting quietly in a chair in the common area.  Patient states that he is waiting on breakfast and would like to take a shower after breakfast.  Will pass along to day shift.

## 2024-02-28 NOTE — ED Notes (Signed)
 VOL/TOC Placement

## 2024-02-28 NOTE — ED Notes (Signed)
Snacks given to pt.

## 2024-02-28 NOTE — ED Notes (Signed)
 Hospital meal provided, pt tolerated w/o complaints.  Waste discarded appropriately.

## 2024-02-28 NOTE — ED Notes (Signed)
 Lunch tray provided to pt.

## 2024-02-29 NOTE — ED Notes (Addendum)
 Patient in dayroom and snack was given

## 2024-02-29 NOTE — ED Notes (Signed)
 Pt ambulated to and from bathroom, no assistance required.

## 2024-02-29 NOTE — TOC CM/SW Note (Signed)
..  Transition of Care [facility]) - Inpatient Brief Assessment   Patient Details  Name: Derrick Atkins MRN: [id number] Date of Birth: 1971-02-11  Transition of Care [facility]) CM/SW Contact:    [name], LCSW Phone Number: 02/29/2024, 9:52 AM   Clinical Narrative:  SW faxed requested documents to [facility] MUST for PASRR #  Transition of Care Asessment:

## 2024-02-29 NOTE — ED Notes (Signed)
 Pt given water

## 2024-02-29 NOTE — ED Notes (Signed)
VOL/TOC Pending Placement

## 2024-02-29 NOTE — ED Notes (Signed)
 Assumed care of patient, pt in dayroom requesting to shower. Shower supplies provided. Patient instructed to wash entire body today, not just his hair. No distress noted

## 2024-02-29 NOTE — ED Notes (Signed)
Lunch was given 

## 2024-02-29 NOTE — ED Notes (Signed)
 Pt provided snack by tech

## 2024-02-29 NOTE — ED Notes (Signed)
 VOL  TOC  PLACEMENT

## 2024-02-29 NOTE — ED Notes (Signed)
 Hospital meal provided, pt tolerated w/o complaints.  Waste discarded appropriately.

## 2024-02-29 NOTE — TOC CM/SW Note (Signed)
..  Transition of Care [facility]) - Inpatient Brief Assessment   Patient Details  Name: Derrick Atkins MRN: [id number] Date of Birth: 1971-06-15  Transition of Care [facility]) CM/SW Contact:    [name], LCSW Phone Number: 02/29/2024, 11:31 AM   Clinical Narrative:  Treatment/ Care Plan  team meeting for potential placement has been scheduled for 8/27 @ 10:30 via teams with hospital staff and Lonell Guy Social Worker III with DSS, Richardson Fickle BA, QP and Hobert Dubs with group home- [facility] [phone]); [address] [place] [address]  Transition of Care Asessment:

## 2024-02-29 NOTE — ED Notes (Signed)
Patient in assigned room.

## 2024-02-29 NOTE — ED Provider Notes (Signed)
 Emergency Medicine Observation Re-evaluation Note  Derrick Atkins is a 53 y.o. male, seen on rounds today.  Pt initially presented to the ED for complaints of Psychiatric Evaluation  Currently, the patient is no acute distress. No issues per nursing team  Physical Exam  Blood pressure (!) 132/57, pulse 74, temperature 97.9 F (36.6 C), resp. rate 17, SpO2 98%.  Physical Exam General: No apparent distress Pulm: Normal WOB Psych: sitting in common area      ED Course / MDM   Clinical Course as of 02/29/24 1102  Thu Feb 16, 2024  1338 CBC with mild stable anemia, leukocytosis  CMP reviewed, unremarkable  EtOH negative  UDS pending [MM]    Clinical Course User Index [MM] Clarine Ozell LABOR, MD   Plan   Current plan is to continue to wait for sw  Patient is not under full IVC at this time.   Derrick Ronal BRAVO, MD 02/29/24 [phone]

## 2024-03-01 NOTE — ED Notes (Signed)
 Pt up to restroom.

## 2024-03-01 NOTE — ED Provider Notes (Signed)
 Emergency Medicine Observation Re-evaluation Note  Derrick Atkins is a 53 y.o. male, seen on rounds today.  Pt initially presented to the ED for complaints of Psychiatric Evaluation  Currently, the patient is no acute distress. No issues per bhu nurse   Physical Exam  Blood pressure 136/82, pulse 72, temperature 98.3 F (36.8 C), temperature source Oral, resp. rate 16, SpO2 100%.  Physical Exam General: No apparent distress Pulm: Normal WOB      ED Course / MDM   Plan   Current plan is to continue to wait for Sw Patient is not under full IVC at this time.   Ernest Ronal BRAVO, MD 03/01/24 [phone]

## 2024-03-01 NOTE — ED Notes (Signed)
 Pt requested a shower. Pt provided with new paper scrubs, socks, soap, Deoderant, tooth brush, toothpaste, and towels. All items were disposed of appropriately. Pt also changed the linens on his bed.

## 2024-03-01 NOTE — ED Notes (Signed)
 Dinner tray given to pt

## 2024-03-01 NOTE — ED Notes (Signed)
Snacks given to pt.

## 2024-03-01 NOTE — ED Notes (Signed)
 Pt to the nurses station multiple times, pt anxious states I dont want Fairy Reynolds, my younger brother involved in any of my care and I just want to know what's going on Explained that at meeting was being set up sometime next week to get new placement, did not provide an actual date. Pt was reassured by still very anxious. Offered pt with PRN medication. See [facility]

## 2024-03-01 NOTE — ED Notes (Signed)
 Vol toc placement

## 2024-03-01 NOTE — ED Notes (Signed)
 Pt provided breakfast in the dayroom

## 2024-03-01 NOTE — ED Notes (Signed)
 Lunch tray provided to pt.

## 2024-03-01 NOTE — ED Notes (Signed)
 PT given night snack by Ed tech, [employer].

## 2024-03-01 NOTE — ED Notes (Signed)
VOL/TOC Pending Placement

## 2024-03-02 MED ORDER — SENNOSIDES-DOCUSATE SODIUM 8.6-50 MG PO TABS
1.0000 | ORAL_TABLET | Freq: Two times a day (BID) | ORAL | Status: DC | PRN
  Administered 2024-03-02 – 2024-03-08 (×2): 1 via ORAL
  Filled 2024-03-02: qty 1

## 2024-03-02 NOTE — ED Notes (Signed)
 Lunch tray provided to pt.

## 2024-03-02 NOTE — ED Notes (Signed)
 Pt sitting in the day room. Denies any needs at this time.

## 2024-03-02 NOTE — ED Notes (Signed)
Pt up to restroom and back to bed

## 2024-03-02 NOTE — ED Notes (Signed)
Snacks given to pt.

## 2024-03-02 NOTE — ED Notes (Signed)
 Pt up to restroom and back to his bed.

## 2024-03-02 NOTE — ED Notes (Signed)
Breakfast tray provided to pt.

## 2024-03-02 NOTE — ED Provider Notes (Signed)
 [facility] Observation Note   ----------------------------------------- 3:00 PM on 03/02/2024 -----------------------------------------  Derrick Atkins is a 53 y.o. male currently boarding in the Emergency Department.  No acute events since last update.  Recent Vitals   Most recent vital signs: Vitals:   03/01/24 2208 03/02/24 0941  BP: (!) 141/74 118/65  Pulse: 78 76  Resp: 18 17  Temp: 98.3 F (36.8 C) 98.3 F (36.8 C)  SpO2: 99% 95%    ED Results / Procedures / Treatments   Labs (all labs ordered are listed, but only abnormal results are displayed) Labs Reviewed  COMPREHENSIVE METABOLIC PANEL WITH GFR - Abnormal; Notable for the following components:      Result Value   Glucose, Bld 127 (*)    All other components within normal limits  CBC - Abnormal; Notable for the following components:   Hemoglobin 12.8 (*)    HCT 38.7 (*)    Platelets 143 (*)    All other components within normal limits  URINE DRUG SCREEN, QUALITATIVE ([facility] ONLY) - Abnormal; Notable for the following components:   MDMA (Ecstasy)Ur Screen POSITIVE (*)    All other components within normal limits  BASIC METABOLIC PANEL WITH GFR - Abnormal; Notable for the following components:   Glucose, Bld 205 (*)    All other components within normal limits  ETHANOL  CBC  D-DIMER, QUANTITATIVE (NOT AT [facility])  TROPONIN I (HIGH SENSITIVITY)  TROPONIN I (HIGH SENSITIVITY)    MEDICATIONS ORDERED IN ED: Medications  aspirin  EC tablet 81 mg (81 mg Oral Given 03/02/24 0903)  benztropine  (COGENTIN ) tablet 1 mg (1 mg Oral Given 03/02/24 0903)  fenofibrate  tablet 54 mg (54 mg Oral Given 03/02/24 0902)  cyanocobalamin  (VITAMIN B12) tablet 1,000 mcg (1,000 mcg Oral Given 03/02/24 0903)  cholecalciferol  (VITAMIN D3) 25 MCG (1000 UNIT) tablet 1,000 Units (1,000 Units Oral Given 03/02/24 0903)  FLUoxetine  (PROZAC ) capsule 20 mg (20 mg Oral Given 03/02/24 0903)  levothyroxine  (SYNTHROID )  tablet 88 mcg (88 mcg Oral Given 03/02/24 0520)  iron  polysaccharides (NIFEREX) capsule 150 mg (150 mg Oral Given 03/02/24 0903)  folic acid  (FOLVITE ) tablet 1 mg (1 mg Oral Given 03/02/24 0903)  metoprolol  tartrate (LOPRESSOR ) tablet 25 mg (25 mg Oral Given 03/02/24 0902)  OLANZapine  (ZYPREXA ) tablet 15 mg (15 mg Oral Given 03/01/24 2107)  pantoprazole  (PROTONIX ) EC tablet 40 mg (40 mg Oral Given 03/02/24 0903)  potassium chloride  SA (KLOR-CON  M) CR tablet 20 mEq (20 mEq Oral Given 03/02/24 0903)  rosuvastatin  (CRESTOR ) tablet 5 mg (5 mg Oral Given 03/02/24 0903)  traZODone  (DESYREL ) tablet 50 mg (50 mg Oral Given 03/01/24 2107)  LORazepam  (ATIVAN ) tablet 2 mg (2 mg Oral Given 03/02/24 1259)    Or  LORazepam  (ATIVAN ) injection 2 mg ( Intramuscular See Alternative 03/02/24 1259)  lidocaine  (LIDODERM ) 5 % 1 patch (1 patch Transdermal Patch Applied 03/02/24 0903)  acetaminophen  (TYLENOL ) tablet 650 mg (650 mg Oral Given 02/27/24 2116)  senna-docusate (Senokot-S) tablet 1 tablet (has no administration in time range)  ziprasidone  (GEODON ) injection 20 mg (20 mg Intramuscular Given 02/17/24 0459)  ziprasidone  (GEODON ) injection 20 mg (20 mg Intramuscular Given 02/17/24 2236)  ARIPiprazole  ER (ABILIFY  MAINTENA) injection 400 mg (400 mg Intramuscular Given 02/19/24 1021)  ibuprofen  (ADVIL ) tablet 800 mg (800 mg Oral Given 02/23/24 2104)  polyethylene glycol (MIRALAX  / GLYCOLAX ) packet 17 g (17 g Oral Given 02/27/24 1400)  docusate sodium  (COLACE) capsule 100 mg (100 mg Oral Given 02/27/24 2115)  ED Plan   Currently awaiting placement into an appropriate living facility.  Social work is working with the patient to help achieve this.      Dorothyann Drivers, MD 03/02/24 1500

## 2024-03-02 NOTE — ED Notes (Signed)
 Shower supplies,hospital scrubs and socks provided to pt

## 2024-03-02 NOTE — Progress Notes (Signed)
   03/02/24 1415  Spiritual Encounters  Type of Visit Follow up  Care provided to: Patient  Conversation partners present during encounter Nurse  Reason for visit Routine spiritual support  OnCall Visit Yes   Chaplain visited with patient to assess mood and see if spiritual care is needed.   Patient shared that he may be discharged next week and Chaplain celebrated with patient.    Rev. Rana M. Nicholaus, M.Div. Chaplain Resident [facility]

## 2024-03-02 NOTE — ED Notes (Signed)
 Dinner tray provided to pt

## 2024-03-02 NOTE — ED Notes (Signed)
 Snacks provided to pt

## 2024-03-03 NOTE — ED Notes (Signed)
 Dinner given to pt. Sitting in common area eating at this time.

## 2024-03-03 NOTE — ED Notes (Signed)
Pastor in to visit with patient.

## 2024-03-03 NOTE — ED Notes (Signed)
 Gave pt snack at this time.

## 2024-03-03 NOTE — ED Notes (Signed)
 Patient received dinner at 4:30pm.

## 2024-03-03 NOTE — ED Notes (Signed)
 Pt sitting in open area facing nurses station. Offered to watch TV. Pt declined.

## 2024-03-03 NOTE — ED Provider Notes (Signed)
 Emergency Medicine Observation Re-evaluation Note  Derrick Atkins is a 53 y.o. male, seen on rounds today.  Pt initially presented to the ED for complaints of Psychiatric Evaluation  No reported events overnight  Physical Exam  BP 139/78   Pulse 84   Temp 98 F (36.7 C) (Oral)   Resp 20   SpO2 97%  Physical Exam   ED Course / MDM  No acute events, no recent lab work for review, patient remains medically cleared    Plan  TOC plans meeting for placement on the 27th which is in 4 days  Treatment/ Care Plan  team meeting for potential placement has been scheduled for 8/27 @ 10:30 via teams with hospital staff and Lonell Stackhouse-Guardianship Social Worker III with DSS, Richardson Fickle BA, QP and Hobert Dubs with group home- [facility] [phone])    [name], MD 03/03/24 [phone]

## 2024-03-03 NOTE — ED Notes (Signed)
 Hospital meal provided, pt tolerated w/o complaints.  Waste discarded appropriately.

## 2024-03-03 NOTE — ED Notes (Signed)
 Pt complains of generalized body pain that sometimes is on left side and sometimes on right side. Denies any injury but possibly slept hard on side. Pt provided with tylenol  for pain

## 2024-03-04 NOTE — ED Notes (Signed)
Patient given afternoon snack.

## 2024-03-04 NOTE — ED Notes (Signed)
Patient given bedtime snack.

## 2024-03-04 NOTE — ED Provider Notes (Signed)
 Emergency Medicine Observation Re-evaluation Note  Derrick Atkins is a 53 y.o. male, seen on rounds today.  Pt initially presented to the ED for complaints of Psychiatric Evaluation Currently, the patient is resting.  Physical Exam  BP (!) 157/67 (BP Location: Right Arm)   Pulse 69   Temp 97.7 F (36.5 C) (Oral)   Resp 16   SpO2 96%   General: No distress   ED Course / MDM  EKG:EKG Interpretation Date/Time:  Sunday February 26 2024 12:12:22 EDT Ventricular Rate:  73 PR Interval:  142 QRS Duration:  92 QT Interval:  406 QTC Calculation: 447 R Axis:   33  Text Interpretation: Normal sinus rhythm Right atrial enlargement Borderline ECG When compared with ECG of 20-Feb-2024 17:04, No significant change was found Confirmed by UNCONFIRMED, DOCTOR ([id number]), editor Alex Slough [phone]) on 02/27/2024 9:38:38 AM  I have reviewed the labs performed to date as well as medications administered while in observation.  Recent changes in the last 24 hours include none.  Plan  Current plan is for placement.  Treatment/ Care Plan  team meeting for potential placement has been scheduled for 8/27 @ 10:30 via teams with hospital staff and Lonell Stackhouse-Guardianship Social Worker III with DSS, Richardson Fickle BA, QP and Hobert Dubs with group home- [facility] [phone])    Claudene Rover, MD 03/04/24 [phone]

## 2024-03-04 NOTE — ED Notes (Addendum)
 Meal given

## 2024-03-04 NOTE — ED Notes (Signed)
VOL/TOC Pending Placement

## 2024-03-04 NOTE — ED Notes (Signed)
 Patient is vol/TOC pending placement

## 2024-03-04 NOTE — ED Notes (Signed)
 Patient given dinner tray.

## 2024-03-04 NOTE — ED Notes (Addendum)
 Pt was given orange sherbet as their snack and sprite.

## 2024-03-04 NOTE — ED Notes (Signed)
Patient took shower.

## 2024-03-04 NOTE — ED Notes (Signed)
 Hospital meal provided, pt tolerated w/o complaints.  Waste discarded appropriately.

## 2024-03-05 NOTE — ED Provider Notes (Signed)
 Emergency Medicine Observation Re-evaluation Note  Derrick Atkins is a 53 y.o. male, seen on rounds today.  Pt initially presented to the ED for complaints of Psychiatric Evaluation Currently, the patient is sitting in day room, no issues overnight.  Physical Exam  BP (!) 144/66   Pulse 79   Temp 98.2 F (36.8 C) (Oral)   Resp 15   SpO2 98%  Physical Exam Patient appears well, no acute distress, normal WOB    ED Course / MDM  EKG:EKG Interpretation Date/Time:  Sunday February 26 2024 12:12:22 EDT Ventricular Rate:  73 PR Interval:  142 QRS Duration:  92 QT Interval:  406 QTC Calculation: 447 R Axis:   33  Text Interpretation: Normal sinus rhythm Right atrial enlargement Borderline ECG When compared with ECG of 20-Feb-2024 17:04, No significant change was found Confirmed by UNCONFIRMED, DOCTOR ([id number]), editor Alex Slough [phone]) on 02/27/2024 9:38:38 AM  I have reviewed the labs performed to date as well as medications administered while in observation.  Recent changes in the last 24 hours include none.  Plan  Current plan is for placement per social work.    Willo Dunnings, MD 03/05/24 [phone]

## 2024-03-05 NOTE — ED Notes (Signed)
Patient in day room. 

## 2024-03-05 NOTE — ED Notes (Signed)
 Vol toc placement

## 2024-03-05 NOTE — ED Notes (Signed)
 Pt was provided with supper tray and soda.

## 2024-03-05 NOTE — ED Notes (Signed)
 Pt given snack.

## 2024-03-05 NOTE — ED Notes (Signed)
 Meal provided

## 2024-03-05 NOTE — ED Notes (Signed)
 Tech provided pt with snack

## 2024-03-05 NOTE — Progress Notes (Signed)
   03/05/24 1430  Spiritual Encounters  Type of Visit Follow up  Care provided to: Patient  Conversation partners present during encounter Nurse  Reason for visit Routine spiritual support  OnCall Visit Yes   Chaplain visited patient and asked if he could describe how he's feeling in 1 word, what would that word be?  Patient chose the word, condemned.  Initially, patient said others were making him feel this way but after sitting with the patient and getting curious, Chaplain found that the patient is going through a cycle of negative self talk.  Patient shared he hears voices that whisper these things to him.  Patient also named some biblical characters who went through tough experiences.  Patient is prayerful that he won't always be so sensitive and sad.  Chaplain encouraged the patient by offering a compassionate presence and reflective listening.    Rev. Rana M. Nicholaus, M,Div. Chaplain Resident  [facility]

## 2024-03-06 NOTE — ED Notes (Signed)
 VOL/TOC Placement

## 2024-03-06 NOTE — ED Notes (Signed)
Snack was provided to pt.

## 2024-03-06 NOTE — ED Notes (Signed)
Pt in dayroom  

## 2024-03-06 NOTE — ED Notes (Signed)
 Pt breakfast Provided.

## 2024-03-06 NOTE — ED Notes (Signed)
 Pt Lunch Provided

## 2024-03-06 NOTE — ED Provider Notes (Signed)
 Emergency Medicine Observation Re-evaluation Note  Derrick Atkins is a 53 y.o. male, seen on rounds today.  Pt initially presented to the ED for complaints of Psychiatric Evaluation Currently, the patient is resting.  Physical Exam  BP (!) 140/83 (BP Location: Left Arm)   Pulse 75   Temp 97.9 F (36.6 C) (Oral)   Resp 16   SpO2 100%  Physical Exam .Gen:  No acute distress Resp:  Breathing easily and comfortably, no accessory muscle usage Neuro:  Moving all four extremities, no gross focal neuro deficits Psych:  Resting currently, calm when awake   ED Course / MDM    I have reviewed the labs performed to date as well as medications administered while in observation.  Recent changes in the last 24 hours include no acute events.  Plan  Current plan is for SW dispo.    Waymond Lorelle Cummins, MD 03/06/24 [phone]

## 2024-03-06 NOTE — ED Notes (Signed)
Pt given dinner tray and sprite.  

## 2024-03-07 LAB — RESP PANEL BY RT-PCR (RSV, FLU A&B, COVID)  RVPGX2
Influenza A by PCR: NEGATIVE
Influenza B by PCR: NEGATIVE
Resp Syncytial Virus by PCR: NEGATIVE
SARS Coronavirus 2 by RT PCR: NEGATIVE

## 2024-03-07 MED ORDER — TUBERCULIN PPD 5 UNIT/0.1ML ID SOLN
5.0000 [IU] | INTRADERMAL | Status: AC
  Administered 2024-03-07: 5 [IU] via INTRADERMAL
  Filled 2024-03-07: qty 0.1

## 2024-03-07 NOTE — ED Provider Notes (Signed)
 Emergency Medicine Observation Re-evaluation Note  Derrick Atkins is a 53 y.o. male, seen on rounds today.  Pt initially presented to the ED for complaints of Psychiatric Evaluation Currently, the patient is resting.  Physical Exam  BP 128/69   Pulse 70   Temp 98.3 F (36.8 C) (Oral)   Resp 16   SpO2 100%  Physical Exam .Gen:  No acute distress Resp:  Breathing easily and comfortably, no accessory muscle usage Neuro:  Moving all four extremities, no gross focal neuro deficits Psych:  Resting currently, calm when awake   ED Course / MDM   I have reviewed the labs performed to date as well as medications administered while in observation.  Recent changes in the last 24 hours include no acute events.  Plan  Current plan is for social work dispo.    Waymond Lorelle Cummins, MD 03/07/24 [phone]

## 2024-03-07 NOTE — ED Notes (Signed)
 Dinner tray provided to pt

## 2024-03-07 NOTE — ED Notes (Addendum)
lunch tray provided to pt.

## 2024-03-07 NOTE — ED Notes (Signed)
Snacks given to pt.

## 2024-03-07 NOTE — TOC Progression Note (Signed)
 Transition of Care [facility]) - Progression Note    Patient Details  Name: Derrick Atkins MRN: [id number] Date of Birth: [birth year]  Transition of Care [facility]) CM/SW Contact  Delphine KANDICE Bring, RN Phone Number: 03/07/2024, 11:31 AM  Clinical Narrative:    TEAMS meeting  this morning with patient, [name], [name], QP for the facility, Christiana Ogidi owner, Raquel Alm ED manager, and ICM Supervisor.  They spoke with patient and asked about medical question with ED management. They requested a PPD, COVID, new FL2, and d/c summary when discharge. They tentatively will admit patient on 9/2. CM asked about transportation at time of d/c. Legal guardian states that she will transport. CM asked  if she can be her by 9am on the 2nd. LG said yes.The owner of the facility and LG will discuss fee and paperwork.  Raquel will reach out to the provider for PPD and COVID test.                      Expected Discharge Plan and Services                                               Social Drivers of Health (SDOH) Interventions SDOH Screenings   Food Insecurity: No Food Insecurity (12/18/2023)  Housing: Low Risk  (12/18/2023)  Transportation Needs: Unmet Transportation Needs (12/18/2023)  Utilities: Not At Risk (12/18/2023)  Alcohol  Screen: Low Risk  (12/18/2023)  Tobacco Use: Low Risk  (02/16/2024)    Readmission Risk Interventions    04/15/2023   11:03 AM  Readmission Risk Prevention Plan  Transportation Screening Complete  HRI or Home Care Consult Complete  Social Work Consult for Recovery Care Planning/Counseling Complete  Medication Review Oceanographer) Complete

## 2024-03-07 NOTE — ED Notes (Signed)
Pt in dayroom  

## 2024-03-07 NOTE — ED Notes (Signed)
 Pt given breakfast tray and juice.

## 2024-03-08 NOTE — ED Notes (Signed)
 Assumed care of patient, pt awake and in dayroom, no distress noted

## 2024-03-08 NOTE — ED Notes (Signed)
 Patient has outside visitor Field seismologist)

## 2024-03-08 NOTE — ED Notes (Signed)
 Patient continues to accidentally urinate on floor in bathroom. This has caused some concern for patient safety due to reports from another patient. Patient has been given a urinal to now use in his room instead and has been instructed to bring urinal to us  to empty. This RN has notified security and nursing support staff of urinal in room. This RN has also notified charge RN of situation.

## 2024-03-08 NOTE — ED Notes (Signed)
 Pt provided meal tray and soda to drink

## 2024-03-08 NOTE — ED Notes (Signed)
Door unlocked for pt to use bathroom. Door locked once pt finished.  °

## 2024-03-08 NOTE — ED Notes (Signed)
 Pt given snack and drink at this time. Pt calm and cooperative.

## 2024-03-08 NOTE — ED Provider Notes (Signed)
 Emergency Medicine Observation Re-evaluation Note  Derrick Atkins is a 53 y.o. male, seen on rounds today.  Pt initially presented to the ED for complaints of Psychiatric Evaluation Currently, the patient is resting in the day room talking to a Engineer, materials.  Physical Exam  BP 139/74 (BP Location: Left Arm)   Pulse 86   Temp 98.3 F (36.8 C) (Oral)   Resp 18   SpO2 96%  Physical Exam General: No distress.  Calm and cooperative.  ED Course / MDM   There have been no significant changes to his clinical status in the last 24 hours.  Plan  Current plan is for disposition by TOC.    Jacolyn Pae, MD 03/08/24 [phone]

## 2024-03-08 NOTE — [place] FL2 (Cosign Needed)
 [place]  MEDICAID FL2 LEVEL OF CARE FORM     IDENTIFICATION  Patient Name: Derrick Atkins Birthdate: Nov 25, 1970 Sex: male Admission Date (Current Location): 02/16/2024  [facility] and [place] Number:  Chiropodist and Address:  [facility], [address], [place], [place] [address]      Provider Number: [id number]  Attending Physician Name and Address:  No att. 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   Intractable nausea and vomiting 05/25/2023   Leukopenia 05/25/2023   Right sided weakness 05/25/2023   Acute encephalopathy 05/25/2023   Altered mental status 04/12/2023   Leg swelling 04/12/2023   Ambulatory dysfunction 02/26/2023   DM2 (diabetes mellitus, type 2) (HCC) 02/26/2023   Hyperlipidemia 02/26/2023   Prolonged QT interval 02/26/2023   Normocytic anemia 02/26/2023   Thrombocytopenia (HCC) 02/21/2023   Rhabdomyolysis 02/21/2023   Hypoglycemia 02/21/2023   Abnormal LFTs 02/21/2023   Fall at home, initial encounter 02/21/2023   Cellulitis of left lower extremity 01/06/2023   Iron  deficiency anemia 01/06/2023   Cellulitis and abscess of left lower extremity 01/06/2023   Nausea & vomiting 01/06/2023   AKI (acute kidney injury) (HCC) 12/13/2022   Dyslipidemia 12/13/2022   Schizophrenia (HCC) 03/23/2022   Aggressive behavior    Schizophrenia, paranoid, chronic (HCC)    Hypothyroidism 12/25/2013   Essential hypertension  05/03/2007   Type 2 diabetes mellitus without complications (HCC) 05/03/2007    Orientation RESPIRATION BLADDER Height & Weight     Self, Time, Situation  Normal Continent Weight:   Height:     BEHAVIORAL SYMPTOMS/MOOD NEUROLOGICAL BOWEL NUTRITION STATUS      Continent Diet  AMBULATORY STATUS COMMUNICATION OF NEEDS Skin   Independent Verbally Normal                       Personal Care Assistance Level of Assistance  Bathing Bathing Assistance: Independent Feeding assistance: Independent Dressing Assistance: Independent     Functional Limitations Info  Sight, Hearing, Speech Sight Info: Adequate Hearing Info: Adequate Speech Info: Adequate    SPECIAL CARE FACTORS FREQUENCY                       Contractures Contractures Info: Present    Additional Factors Info  Code Status, Allergies Code Status Info: Full Code Allergies Info: Valproic Acid  And Related High  Other ,  Ondansetron , Phenytoin Sodium Extended,  Prednisone ,   Latex           Current Medications (03/08/2024):  This is the current hospital active medication list Current Facility-Administered Medications  Medication Dose Route Frequency Provider Last Rate Last Admin   aspirin  EC tablet 81 mg  81 mg Oral Daily Kinner, Robert, MD   81 mg at 03/08/24 9173   benztropine  (COGENTIN ) tablet 1 mg  1 mg Oral Daily Kinner, Robert, MD   1 mg at 03/08/24 0826   cholecalciferol  (VITAMIN D3) 25 MCG (1000 UNIT) tablet 1,000 Units  1,000 Units  Oral Daily [name], MD   1,000 Units at 03/08/24 9173   cyanocobalamin  (VITAMIN B12) tablet 1,000 mcg  1,000 mcg Oral Daily Kinner, Robert, MD   1,000 mcg at 03/08/24 9173   fenofibrate  tablet 54 mg  54 mg Oral Daily [name], MD   54 mg at 03/08/24 9166   FLUoxetine  (PROZAC ) capsule 20 mg  20 mg Oral Daily Kinner, Robert, MD   20 mg at 03/08/24 0827   folic acid  (FOLVITE ) tablet 1 mg  1 mg Oral Daily Kinner, Robert, MD   1 mg at 03/08/24 0827   iron   polysaccharides (NIFEREX) capsule 150 mg  150 mg Oral Daily Kinner, Robert, MD   150 mg at 03/08/24 9167   levothyroxine  (SYNTHROID ) tablet 88 mcg  88 mcg Oral Q0600 Kinner, Robert, MD   88 mcg at 03/08/24 0506   lidocaine  (LIDODERM ) 5 % 1 patch  1 patch Transdermal Q24H Ernest Ronal BRAVO, MD   1 patch at 03/08/24 9378   LORazepam  (ATIVAN ) tablet 2 mg  2 mg Oral Q6H PRN Paduchowski, Kevin, MD   2 mg at 03/07/24 1044   Or   LORazepam  (ATIVAN ) injection 2 mg  2 mg Intramuscular Q6H PRN Paduchowski, Kevin, MD   2 mg at 02/25/24 1324   metoprolol  tartrate (LOPRESSOR ) tablet 25 mg  25 mg Oral BID Kinner, Robert, MD   25 mg at 03/08/24 9167   OLANZapine  (ZYPREXA ) tablet 15 mg  15 mg Oral QHS Kinner, Robert, MD   15 mg at 03/07/24 2104   pantoprazole  (PROTONIX ) EC tablet 40 mg  40 mg Oral QAC breakfast [name], MD   40 mg at 03/08/24 9173   potassium chloride  SA (KLOR-CON  M) CR tablet 20 mEq  20 mEq Oral Daily [name], MD   20 mEq at 03/08/24 0827   rosuvastatin  (CRESTOR ) tablet 5 mg  5 mg Oral Daily Kinner, Robert, MD   5 mg at 03/08/24 9166   senna-docusate (Senokot-S) tablet 1 tablet  1 tablet Oral BID PRN Paduchowski, Kevin, MD   1 tablet at 03/08/24 0827   traZODone  (DESYREL ) tablet 50 mg  50 mg Oral QHS Kinner, Robert, MD   50 mg at 03/07/24 2104   tuberculin injection 5 Units  5 Units Intradermal STAT Waymond Lorelle Cummins, MD   5 Units at 03/07/24 1446   Current Outpatient Medications  Medication Sig Dispense Refill   ABILIFY  MAINTENA 400 MG PRSY prefilled syringe Inject 400 mg into the muscle every 28 (twenty-eight) days.     albuterol  (VENTOLIN  HFA) 108 (90 Base) MCG/ACT inhaler Inhale 2 puffs into the lungs every 4 (four) hours as needed for shortness of breath or wheezing. 17 each 0   ascorbic acid  (VITAMIN C ) 500 MG tablet Take 1 tablet (500 mg total) by mouth daily. 30 tablet 3   aspirin  EC 81 MG tablet Take 1 tablet (81 mg total) by mouth daily. Swallow whole. 30 tablet 12   benztropine   (COGENTIN ) 1 MG tablet Take 1 tablet (1 mg total) by mouth daily. 30 tablet 0   cholecalciferol  (VITAMIN D3) 25 MCG (1000 UNIT) tablet Take 1 tablet (1,000 Units total) by mouth daily. 30 tablet 0   cyanocobalamin  (VITAMIN B12) 1000 MCG tablet Take 1 tablet (1,000 mcg total) by mouth daily. 30 tablet 0   fenofibrate  54 MG tablet Take 1 tablet (54 mg total) by mouth daily. 30 tablet 0   FLUoxetine  (PROZAC ) 20 MG capsule Take 1 capsule (20 mg  total) by mouth daily. 30 capsule 0   folic acid  (FOLVITE ) 1 MG tablet Take 1 tablet (1 mg total) by mouth daily. 30 tablet 0   iron  polysaccharides (NIFEREX) 150 MG capsule Take 1 capsule (150 mg total) by mouth daily. 30 capsule 0   levothyroxine  (SYNTHROID ) 88 MCG tablet Take 1 tablet (88 mcg total) by mouth daily at 6 (six) AM. 30 tablet 0   metoprolol  tartrate (LOPRESSOR ) 25 MG tablet Take 1 tablet (25 mg total) by mouth 2 (two) times daily. 30 tablet 0   OLANZapine  (ZYPREXA ) 15 MG tablet Take 1 tablet (15 mg total) by mouth at bedtime. 30 tablet 0   pantoprazole  (PROTONIX ) 40 MG tablet Take 1 tablet (40 mg total) by mouth daily before breakfast. 30 tablet 0   potassium chloride  SA (KLOR-CON  M) 20 MEQ tablet Take 1 tablet (20 mEq total) by mouth daily. 30 tablet 0   rosuvastatin  (CRESTOR ) 5 MG tablet Take 1 tablet (5 mg total) by mouth daily. 30 tablet 0   senna (SENOKOT) 8.6 MG TABS tablet Take 2 tablets by mouth at bedtime.     traZODone  (DESYREL ) 50 MG tablet Take 1 tablet (50 mg total) by mouth at bedtime. 30 tablet 0   LINZESS  290 MCG CAPS capsule Take 1 capsule (290 mcg total) by mouth daily. 30 capsule 0     Discharge Medications: Please see discharge summary for a list of discharge medications.  Relevant Imaging Results:  Relevant Lab Results:   Additional Information SSN [id number]  Delphine KANDICE Bring, RN

## 2024-03-08 NOTE — ED Notes (Signed)
Patient in day room. 

## 2024-03-08 NOTE — Progress Notes (Signed)
   03/08/24 1330  Spiritual Encounters  Type of Visit Initial  Care provided to: Patient  Referral source Clinical staff  Reason for visit Routine spiritual support  OnCall Visit No   While rounding on unit, care team suggested visit with Mr. Derrick Atkins.  [name] welcomed my visit. We sat at open area table with security guard, Dorise, nearby.  [name] expressed desire to speak but initially was hesitant. I asked what would make it easier, and he shared that it would be nice if we could be in the chapel. I affirmed that desire while remaining present and gradually [name] to engage, sharing aspects of his family life. He processed feelings of loss for his parents with whom he described loving relationships (mom brought Neema along to a sign language class where he also learned to sign; dad was witty and loved). Relationship with his brother has been inconsistent. [name]'s uncle had a music store in downtown [place] and [name] shares a love of music. He also writes Curator. His name means gift. He misses the feeling of acceptance experienced when in those classes with disabled folks.  I provided non-anxious, relational support and compassionate presence. I engaged through active listening. I engaged [name]'s grief toward parents by fostering sense of ongoing connection (asking what would they say if they were here). I utilized music by playing [name] hip-hop group [name] shared. I affirmed [name]'s gifts, reflecting back what he shared about himself and also that I enjoyed the opportunity to get to know him.  Visit was appreciated. Spiritual care team will continue to follow. [name] [name] L. Delores HERO.Div

## 2024-03-08 NOTE — ED Notes (Signed)
 Door unlocked for pt to use bathroom.

## 2024-03-08 NOTE — [place] FL2 (Incomplete Revision)
 [place]  MEDICAID FL2 LEVEL OF CARE FORM     IDENTIFICATION  Patient Name: Derrick Atkins Birthdate: Nov 25, 1970 Sex: male Admission Date (Current Location): 02/16/2024  [facility] and [place] Number:  Chiropodist and Address:  [facility], [address], [place], [place] [address]      Provider Number: [id number]  Attending Physician Name and Address:  No att. 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   Intractable nausea and vomiting 05/25/2023   Leukopenia 05/25/2023   Right sided weakness 05/25/2023   Acute encephalopathy 05/25/2023   Altered mental status 04/12/2023   Leg swelling 04/12/2023   Ambulatory dysfunction 02/26/2023   DM2 (diabetes mellitus, type 2) (HCC) 02/26/2023   Hyperlipidemia 02/26/2023   Prolonged QT interval 02/26/2023   Normocytic anemia 02/26/2023   Thrombocytopenia (HCC) 02/21/2023   Rhabdomyolysis 02/21/2023   Hypoglycemia 02/21/2023   Abnormal LFTs 02/21/2023   Fall at home, initial encounter 02/21/2023   Cellulitis of left lower extremity 01/06/2023   Iron  deficiency anemia 01/06/2023   Cellulitis and abscess of left lower extremity 01/06/2023   Nausea & vomiting 01/06/2023   AKI (acute kidney injury) (HCC) 12/13/2022   Dyslipidemia 12/13/2022   Schizophrenia (HCC) 03/23/2022   Aggressive behavior    Schizophrenia, paranoid, chronic (HCC)    Hypothyroidism 12/25/2013   Essential hypertension  05/03/2007   Type 2 diabetes mellitus without complications (HCC) 05/03/2007    Orientation RESPIRATION BLADDER Height & Weight     Self, Time, Situation  Normal Continent Weight:   Height:     BEHAVIORAL SYMPTOMS/MOOD NEUROLOGICAL BOWEL NUTRITION STATUS      Continent Diet  AMBULATORY STATUS COMMUNICATION OF NEEDS Skin   Independent Verbally Normal                       Personal Care Assistance Level of Assistance  Bathing Bathing Assistance: Independent Feeding assistance: Independent Dressing Assistance: Independent     Functional Limitations Info  Sight, Hearing, Speech Sight Info: Adequate Hearing Info: Adequate Speech Info: Adequate    SPECIAL CARE FACTORS FREQUENCY                       Contractures Contractures Info: Present    Additional Factors Info  Code Status, Allergies Code Status Info: Full Code Allergies Info: Valproic Acid  And Related High  Other ,  Ondansetron , Phenytoin Sodium Extended,  Prednisone ,   Latex           Current Medications (03/08/2024):  This is the current hospital active medication list Current Facility-Administered Medications  Medication Dose Route Frequency Provider Last Rate Last Admin   aspirin  EC tablet 81 mg  81 mg Oral Daily Kinner, Robert, MD   81 mg at 03/08/24 9173   benztropine  (COGENTIN ) tablet 1 mg  1 mg Oral Daily Kinner, Robert, MD   1 mg at 03/08/24 0826   cholecalciferol  (VITAMIN D3) 25 MCG (1000 UNIT) tablet 1,000 Units  1,000 Units  Oral Daily [name], MD   1,000 Units at 03/08/24 9173   cyanocobalamin  (VITAMIN B12) tablet 1,000 mcg  1,000 mcg Oral Daily Kinner, Robert, MD   1,000 mcg at 03/08/24 9173   fenofibrate  tablet 54 mg  54 mg Oral Daily [name], MD   54 mg at 03/08/24 9166   FLUoxetine  (PROZAC ) capsule 20 mg  20 mg Oral Daily Kinner, Robert, MD   20 mg at 03/08/24 0827   folic acid  (FOLVITE ) tablet 1 mg  1 mg Oral Daily Kinner, Robert, MD   1 mg at 03/08/24 0827   iron   polysaccharides (NIFEREX) capsule 150 mg  150 mg Oral Daily Kinner, Robert, MD   150 mg at 03/08/24 9167   levothyroxine  (SYNTHROID ) tablet 88 mcg  88 mcg Oral Q0600 Kinner, Robert, MD   88 mcg at 03/08/24 0506   lidocaine  (LIDODERM ) 5 % 1 patch  1 patch Transdermal Q24H Ernest Ronal BRAVO, MD   1 patch at 03/08/24 9378   LORazepam  (ATIVAN ) tablet 2 mg  2 mg Oral Q6H PRN Paduchowski, Kevin, MD   2 mg at 03/07/24 1044   Or   LORazepam  (ATIVAN ) injection 2 mg  2 mg Intramuscular Q6H PRN Paduchowski, Kevin, MD   2 mg at 02/25/24 1324   metoprolol  tartrate (LOPRESSOR ) tablet 25 mg  25 mg Oral BID Kinner, Robert, MD   25 mg at 03/08/24 9167   OLANZapine  (ZYPREXA ) tablet 15 mg  15 mg Oral QHS Kinner, Robert, MD   15 mg at 03/07/24 2104   pantoprazole  (PROTONIX ) EC tablet 40 mg  40 mg Oral QAC breakfast [name], MD   40 mg at 03/08/24 9173   potassium chloride  SA (KLOR-CON  M) CR tablet 20 mEq  20 mEq Oral Daily [name], MD   20 mEq at 03/08/24 0827   rosuvastatin  (CRESTOR ) tablet 5 mg  5 mg Oral Daily Kinner, Robert, MD   5 mg at 03/08/24 9166   senna-docusate (Senokot-S) tablet 1 tablet  1 tablet Oral BID PRN Paduchowski, Kevin, MD   1 tablet at 03/08/24 0827   traZODone  (DESYREL ) tablet 50 mg  50 mg Oral QHS Kinner, Robert, MD   50 mg at 03/07/24 2104   tuberculin injection 5 Units  5 Units Intradermal STAT Waymond Lorelle Cummins, MD   5 Units at 03/07/24 1446   Current Outpatient Medications  Medication Sig Dispense Refill   ABILIFY  MAINTENA 400 MG PRSY prefilled syringe Inject 400 mg into the muscle every 28 (twenty-eight) days.     albuterol  (VENTOLIN  HFA) 108 (90 Base) MCG/ACT inhaler Inhale 2 puffs into the lungs every 4 (four) hours as needed for shortness of breath or wheezing. 17 each 0   ascorbic acid  (VITAMIN C ) 500 MG tablet Take 1 tablet (500 mg total) by mouth daily. 30 tablet 3   aspirin  EC 81 MG tablet Take 1 tablet (81 mg total) by mouth daily. Swallow whole. 30 tablet 12   benztropine   (COGENTIN ) 1 MG tablet Take 1 tablet (1 mg total) by mouth daily. 30 tablet 0   cholecalciferol  (VITAMIN D3) 25 MCG (1000 UNIT) tablet Take 1 tablet (1,000 Units total) by mouth daily. 30 tablet 0   cyanocobalamin  (VITAMIN B12) 1000 MCG tablet Take 1 tablet (1,000 mcg total) by mouth daily. 30 tablet 0   fenofibrate  54 MG tablet Take 1 tablet (54 mg total) by mouth daily. 30 tablet 0   FLUoxetine  (PROZAC ) 20 MG capsule Take 1 capsule (20 mg  total) by mouth daily. 30 capsule 0   folic acid  (FOLVITE ) 1 MG tablet Take 1 tablet (1 mg total) by mouth daily. 30 tablet 0   iron  polysaccharides (NIFEREX) 150 MG capsule Take 1 capsule (150 mg total) by mouth daily. 30 capsule 0   levothyroxine  (SYNTHROID ) 88 MCG tablet Take 1 tablet (88 mcg total) by mouth daily at 6 (six) AM. 30 tablet 0   metoprolol  tartrate (LOPRESSOR ) 25 MG tablet Take 1 tablet (25 mg total) by mouth 2 (two) times daily. 30 tablet 0   OLANZapine  (ZYPREXA ) 15 MG tablet Take 1 tablet (15 mg total) by mouth at bedtime. 30 tablet 0   pantoprazole  (PROTONIX ) 40 MG tablet Take 1 tablet (40 mg total) by mouth daily before breakfast. 30 tablet 0   potassium chloride  SA (KLOR-CON  M) 20 MEQ tablet Take 1 tablet (20 mEq total) by mouth daily. 30 tablet 0   rosuvastatin  (CRESTOR ) 5 MG tablet Take 1 tablet (5 mg total) by mouth daily. 30 tablet 0   senna (SENOKOT) 8.6 MG TABS tablet Take 2 tablets by mouth at bedtime.     traZODone  (DESYREL ) 50 MG tablet Take 1 tablet (50 mg total) by mouth at bedtime. 30 tablet 0   LINZESS  290 MCG CAPS capsule Take 1 capsule (290 mcg total) by mouth daily. 30 capsule 0     Discharge Medications: Please see discharge summary for a list of discharge medications.  Relevant Imaging Results:  Relevant Lab Results:   Additional Information SSN [id number]  Delphine KANDICE Bring, RN

## 2024-03-09 MED ORDER — OLANZAPINE 5 MG PO TABS: 15.0000 mg | ORAL_TABLET | Freq: Every day | ORAL | 0 refills | Status: AC

## 2024-03-09 MED ORDER — PANTOPRAZOLE SODIUM 40 MG PO TBEC: 40.0000 mg | DELAYED_RELEASE_TABLET | Freq: Every day | ORAL | 0 refills | Status: AC

## 2024-03-09 MED ORDER — LORAZEPAM 1 MG PO TABS: 2.0000 mg | ORAL_TABLET | Freq: Two times a day (BID) | ORAL | 0 refills | Status: AC | PRN

## 2024-03-09 MED ORDER — METOPROLOL TARTRATE 25 MG PO TABS: 25.0000 mg | ORAL_TABLET | Freq: Two times a day (BID) | ORAL | 0 refills | Status: AC

## 2024-03-09 MED ORDER — ROSUVASTATIN CALCIUM 5 MG PO TABS: 5.0000 mg | ORAL_TABLET | Freq: Every day | ORAL | 0 refills | Status: AC

## 2024-03-09 MED ORDER — FENOFIBRATE 48 MG PO TABS: 48.0000 mg | ORAL_TABLET | Freq: Every day | ORAL | 0 refills | Status: AC

## 2024-03-09 MED ORDER — FOLIC ACID 1 MG PO TABS: 1.0000 mg | ORAL_TABLET | Freq: Every day | ORAL | 0 refills | Status: AC

## 2024-03-09 MED ORDER — LIDOCAINE 5 % EX PTCH: 1.0000 | MEDICATED_PATCH | CUTANEOUS | 0 refills | Status: AC

## 2024-03-09 MED ORDER — TRAZODONE HCL 50 MG PO TABS: 50.0000 mg | ORAL_TABLET | Freq: Every day | ORAL | 0 refills | Status: AC

## 2024-03-09 MED ORDER — BENZTROPINE MESYLATE 1 MG PO TABS: 1.0000 mg | ORAL_TABLET | Freq: Every day | ORAL | 0 refills | Status: AC

## 2024-03-09 MED ORDER — LEVOTHYROXINE SODIUM 88 MCG PO TABS: 88.0000 ug | ORAL_TABLET | Freq: Every day | ORAL | 0 refills | Status: AC

## 2024-03-09 MED ORDER — ASPIRIN 81 MG PO TBEC: 81.0000 mg | DELAYED_RELEASE_TABLET | Freq: Every day | ORAL | 0 refills | Status: AC

## 2024-03-09 MED ORDER — DOCUSATE SODIUM 100 MG PO CAPS: 100.0000 mg | ORAL_CAPSULE | Freq: Two times a day (BID) | ORAL | 0 refills | Status: AC | PRN

## 2024-03-09 MED ORDER — LORAZEPAM 2 MG/ML IJ SOLN: 2.0000 mg | Freq: Every day | INTRAMUSCULAR | 2 refills | Status: AC | PRN

## 2024-03-09 MED ORDER — FLUOXETINE HCL 20 MG PO CAPS: 20.0000 mg | ORAL_CAPSULE | Freq: Every day | ORAL | 0 refills | Status: AC

## 2024-03-09 MED ORDER — POTASSIUM CHLORIDE CRYS ER 20 MEQ PO TBCR: 20.0000 meq | EXTENDED_RELEASE_TABLET | Freq: Every day | ORAL | 0 refills | Status: AC

## 2024-03-09 MED ORDER — VITAMIN B-12 1000 MCG PO TABS: 1000.0000 ug | ORAL_TABLET | Freq: Every day | ORAL | 0 refills | Status: AC

## 2024-03-09 NOTE — ED Notes (Signed)
Pt finished taking a shower

## 2024-03-09 NOTE — ED Notes (Signed)
 Lunch tray provided to pt.

## 2024-03-09 NOTE — ED Provider Notes (Signed)
-----------------------------------------   1:58 PM on 03/09/2024 ----------------------------------------- I have printed 30-day prescriptions of the patient's current medications including his p.o. and IM as needed Ativan  as requested by the facility considering acceptance.   Dorothyann Drivers, MD 03/09/24 1358

## 2024-03-09 NOTE — ED Notes (Signed)
 Snacks provided to pt

## 2024-03-09 NOTE — ED Notes (Signed)
 Pt given breakfast.

## 2024-03-09 NOTE — ED Notes (Signed)
 Vol toc placement

## 2024-03-09 NOTE — TOC Progression Note (Signed)
 Transition of Care [facility]) - Progression Note    Patient Details  Name: Derrick Atkins MRN: [id number] Date of Birth: [birth year]  [facility]) CM/SW Contact  Marinda Cooks, RN Phone Number: 03/09/2024, 1:27 PM  Clinical Narrative:    Per chart review pt awaiting dc to group home on 09/02. Pt has legal Guardian that is involved with dc plan . TOC will cont to follow dc planning / care coordination and update as applicable.                     Expected Discharge Plan and Services                                               Social Drivers of Health (SDOH) Interventions SDOH Screenings   Food Insecurity: No Food Insecurity (12/18/2023)  Housing: Low Risk  (12/18/2023)  Transportation Needs: Unmet Transportation Needs (12/18/2023)  Utilities: Not At Risk (12/18/2023)  Alcohol  Screen: Low Risk  (12/18/2023)  Tobacco Use: Low Risk  (02/16/2024)    Readmission Risk Interventions    04/15/2023   11:03 AM  Readmission Risk Prevention Plan  Transportation Screening Complete  HRI or Home Care Consult Complete  Social Work Consult for Recovery Care Planning/Counseling Complete  Medication Review Oceanographer) Complete

## 2024-03-09 NOTE — ED Notes (Signed)
 PPD to left forearm is negative.

## 2024-03-09 NOTE — TOC Progression Note (Signed)
 Transition of Care [facility]) - Progression Note    Patient Details  Name: Derrick Atkins MRN: [id number] Date of Birth: 08-27-1970  Transition of Care [facility]) CM/SW Contact  Delphine KANDICE Bring, RN Phone Number: 03/09/2024, 4:22 PM  Clinical Narrative:    JANAS LOWING , and PPD emailed to [facility].                     Expected Discharge Plan and Services                                               Social Drivers of Health (SDOH) Interventions SDOH Screenings   Food Insecurity: No Food Insecurity (12/18/2023)  Housing: Low Risk  (12/18/2023)  Transportation Needs: Unmet Transportation Needs (12/18/2023)  Utilities: Not At Risk (12/18/2023)  Alcohol  Screen: Low Risk  (12/18/2023)  Tobacco Use: Low Risk  (02/16/2024)    Readmission Risk Interventions    04/15/2023   11:03 AM  Readmission Risk Prevention Plan  Transportation Screening Complete  HRI or Home Care Consult Complete  Social Work Consult for Recovery Care Planning/Counseling Complete  Medication Review Oceanographer) Complete

## 2024-03-09 NOTE — Progress Notes (Signed)
   03/09/24 1515  Spiritual Encounters  Type of Visit Follow up  Care provided to: Patient  Conversation partners present during encounter Nurse  Reason for visit Routine spiritual support  OnCall Visit No   Chaplain visited patient while on the Unit and asked if he'd like to speak.  Patient declined spiritual care.    Rev. Rana M. Nicholaus, M.Div. Chaplain Resident [facility]

## 2024-03-09 NOTE — ED Provider Notes (Signed)
 [facility] Observation Note   ----------------------------------------- 9:20 AM on 03/09/2024 -----------------------------------------  Derrick Atkins is a 53 y.o. male currently boarding in the Emergency Department.  No acute events since last update.  Recent Vitals   Most recent vital signs: Vitals:   03/08/24 2157 03/09/24 0843  BP: 136/83 133/73  Pulse: 84 73  Resp: 18 17  Temp: 97.8 F (36.6 C) 97.9 F (36.6 C)  SpO2: 98% 100%    ED Results / Procedures / Treatments   Labs (all labs ordered are listed, but only abnormal results are displayed) Labs Reviewed  COMPREHENSIVE METABOLIC PANEL WITH GFR - Abnormal; Notable for the following components:      Result Value   Glucose, Bld 127 (*)    All other components within normal limits  CBC - Abnormal; Notable for the following components:   Hemoglobin 12.8 (*)    HCT 38.7 (*)    Platelets 143 (*)    All other components within normal limits  URINE DRUG SCREEN, QUALITATIVE ([facility] ONLY) - Abnormal; Notable for the following components:   MDMA (Ecstasy)Ur Screen POSITIVE (*)    All other components within normal limits  BASIC METABOLIC PANEL WITH GFR - Abnormal; Notable for the following components:   Glucose, Bld 205 (*)    All other components within normal limits  RESP PANEL BY RT-PCR (RSV, FLU A&B, COVID)  [id number]  ETHANOL  CBC  D-DIMER, QUANTITATIVE (NOT AT [facility])  TROPONIN I (HIGH SENSITIVITY)  TROPONIN I (HIGH SENSITIVITY)    MEDICATIONS ORDERED IN ED: Medications  aspirin  EC tablet 81 mg (81 mg Oral Given 03/08/24 0826)  benztropine  (COGENTIN ) tablet 1 mg (1 mg Oral Given 03/08/24 0826)  fenofibrate  tablet 54 mg (54 mg Oral Given 03/08/24 0833)  cyanocobalamin  (VITAMIN B12) tablet 1,000 mcg (1,000 mcg Oral Given 03/08/24 0826)  cholecalciferol  (VITAMIN D3) 25 MCG (1000 UNIT) tablet 1,000 Units (1,000 Units Oral Given 03/08/24 0826)  FLUoxetine  (PROZAC ) capsule 20 mg (20 mg Oral  Given 03/08/24 0827)  levothyroxine  (SYNTHROID ) tablet 88 mcg (88 mcg Oral Given 03/09/24 0624)  iron  polysaccharides (NIFEREX) capsule 150 mg (150 mg Oral Given 03/08/24 0832)  folic acid  (FOLVITE ) tablet 1 mg (1 mg Oral Given 03/08/24 0827)  metoprolol  tartrate (LOPRESSOR ) tablet 25 mg (25 mg Oral Given 03/08/24 2100)  OLANZapine  (ZYPREXA ) tablet 15 mg (15 mg Oral Given 03/08/24 2101)  pantoprazole  (PROTONIX ) EC tablet 40 mg (40 mg Oral Given 03/08/24 0826)  potassium chloride  SA (KLOR-CON  M) CR tablet 20 mEq (20 mEq Oral Given 03/08/24 0827)  rosuvastatin  (CRESTOR ) tablet 5 mg (5 mg Oral Given 03/08/24 0833)  traZODone  (DESYREL ) tablet 50 mg (50 mg Oral Given 03/08/24 2102)  LORazepam  (ATIVAN ) tablet 2 mg (2 mg Oral Given 03/07/24 1044)    Or  LORazepam  (ATIVAN ) injection 2 mg ( Intramuscular See Alternative 03/07/24 1044)  lidocaine  (LIDODERM ) 5 % 1 patch (1 patch Transdermal Patch Removed 03/08/24 1847)  acetaminophen  (TYLENOL ) tablet 650 mg (650 mg Oral Not Given 03/03/24 2116)  senna-docusate (Senokot-S) tablet 1 tablet (1 tablet Oral Given 03/08/24 0827)  tuberculin injection 5 Units (5 Units Intradermal Given 03/07/24 1446)  ziprasidone  (GEODON ) injection 20 mg (20 mg Intramuscular Given 02/17/24 0459)  ziprasidone  (GEODON ) injection 20 mg (20 mg Intramuscular Given 02/17/24 2236)  ARIPiprazole  ER (ABILIFY  MAINTENA) injection 400 mg (400 mg Intramuscular Given 02/19/24 1021)  ibuprofen  (ADVIL ) tablet 800 mg (800 mg Oral Given 02/23/24 2104)  polyethylene glycol (MIRALAX  / GLYCOLAX ) packet  17 g (17 g Oral Given 02/27/24 1400)  docusate sodium  (COLACE) capsule 100 mg (100 mg Oral Given 02/27/24 2115)     ED Plan   Currently awaiting placement into an appropriate living facility.  Social work is working with the patient to help achieve this.  I signed the patient's FL 2 form this morning to help aid with placement.    Dorothyann Drivers, MD 03/09/24 [phone]

## 2024-03-10 NOTE — ED Notes (Signed)
 Pt provided with shower supplies and clean scrubs.

## 2024-03-10 NOTE — ED Notes (Signed)
 Pt provided with breakfast tray.

## 2024-03-10 NOTE — ED Notes (Signed)
 Pt knocked on door asking if supper had come yet this tech let him know it hadnt arrived yet pt

## 2024-03-10 NOTE — ED Notes (Signed)
 Pt's pastor is visiting w/ pt.

## 2024-03-10 NOTE — ED Notes (Signed)
 Pt provided with snack

## 2024-03-10 NOTE — ED Provider Notes (Signed)
 Emergency Medicine Observation Re-evaluation Note  Derrick Atkins is a 53 y.o. male, seen on rounds today.  Pt initially presented to the ED for complaints of Psychiatric Evaluation Currently, the patient is ambulating in the unit without distress or concern..  Physical Exam  BP 133/62 (BP Location: Right Arm)   Pulse 64   Temp 97.7 F (36.5 C) (Oral)   Resp 17   SpO2 100%  Physical Exam General: Ambulatory, even unlabored respirations steady gait Cardiac:  Lungs: Normal even lungs respirations Psych: Calm, no agitation  ED Course / MDM   I have reviewed the labs performed to date as well as medications administered while in observation.    Plan   Currently awaiting placement into an appropriate living facility. Social work is working with the patient to help achieve this.     [name], MD 03/10/24 [phone]

## 2024-03-10 NOTE — ED Notes (Signed)
 Pt received lunch tray

## 2024-03-10 NOTE — ED Notes (Signed)
 This tech took pt to the bathroom.

## 2024-03-10 NOTE — ED Notes (Signed)
Pt received dinner. 

## 2024-03-11 NOTE — ED Notes (Signed)
 VOL/TOC Placement

## 2024-03-11 NOTE — ED Notes (Signed)
 Pt taking shower. Pt was given hygiene items and the following, 1 clean top, 1 clean bottom, with 1 pair of disposable underwear.  Pt changed out into clean clothing.  Staff disposed of all shower supplies.

## 2024-03-11 NOTE — ED Notes (Signed)
 Patient given snack. No acute needs at this time.

## 2024-03-11 NOTE — ED Notes (Signed)
Lunch tray and drink provided to pt 

## 2024-03-11 NOTE — ED Provider Notes (Signed)
 Emergency Medicine Observation Re-evaluation Note  Derrick Atkins is a 53 y.o. male, seen on rounds today.  Pt initially presented to the ED for complaints of Psychiatric Evaluation Currently, the patient is resting.  Physical Exam  BP (!) 147/65 (BP Location: Right Arm)   Pulse 76   Temp 97.9 F (36.6 C) (Oral)   Resp 18   SpO2 97%  Physical Exam .Gen:  No acute distress Resp:  Breathing easily and comfortably, no accessory muscle usage Neuro:  Moving all four extremities, no gross focal neuro deficits Psych:  Resting currently, calm when awake   ED Course / MDM    I have reviewed the labs performed to date as well as medications administered while in observation.  Recent changes in the last 24 hours include no acute events.  Plan  Current plan is for social work dispo.    Waymond Lorelle Cummins, MD 03/11/24 [phone]

## 2024-03-11 NOTE — ED Notes (Signed)
Snack was provided to pt.

## 2024-03-11 NOTE — ED Notes (Signed)
 Hospital meal provided, pt tolerated w/o complaints.  Waste discarded appropriately.

## 2024-03-12 NOTE — ED Notes (Signed)
Pt was provided breakfast tray

## 2024-03-12 NOTE — ED Notes (Signed)
 Snack given.
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
## 2024-03-12 NOTE — ED Notes (Signed)
 Patient is vol pending placement

## 2024-03-12 NOTE — ED Provider Notes (Signed)
 Emergency Medicine Observation Re-evaluation Note  Derrick Atkins is a 53 y.o. male, seen on rounds today.  Pt initially presented to the ED for complaints of Psychiatric Evaluation Currently, the patient is sleeping  Physical Exam  BP (!) 151/68 (BP Location: Right Arm)   Pulse 72   Temp 97.8 F (36.6 C) (Oral)   Resp 15   SpO2 98%  Physical Exam General: Resting comfortably Cardiac: Appears well-perfused Lungs: Equal chest rise no distress   ED Course / MDM  EKG:EKG Interpretation Date/Time:  Sunday February 26 2024 12:12:22 EDT Ventricular Rate:  73 PR Interval:  142 QRS Duration:  92 QT Interval:  406 QTC Calculation: 447 R Axis:   33  Text Interpretation: Normal sinus rhythm Right atrial enlargement Borderline ECG When compared with ECG of 20-Feb-2024 17:04, No significant change was found Confirmed by UNCONFIRMED, DOCTOR ([id number]), editor Alex Slough [phone]) on 02/27/2024 9:38:38 AM  I have reviewed the labs performed to date as well as medications administered while in observation.  Recent changes in the last 24 hours include no acute events  Plan  Current plan is for plan per social work    Nicholaus Rolland BRAVO, MD 03/12/24 [phone]

## 2024-03-12 NOTE — ED Notes (Signed)
 Patient requesting shower. Bathing supplies, towel/washcloth, and clean scrubs/underwear/socks given. Patient to shower room to bathe.

## 2024-03-12 NOTE — ED Notes (Signed)
 Snacks provided to pt

## 2024-03-12 NOTE — ED Notes (Signed)
 Lunch tray provided to pt.

## 2024-03-12 NOTE — ED Notes (Signed)
Vol  TOC  placement

## 2024-03-12 NOTE — ED Notes (Signed)
 Dinner tray provided to pt

## 2024-03-12 NOTE — ED Notes (Signed)
 Meal tray given

## 2024-03-13 NOTE — ED Notes (Signed)
 Vol/Toc/pending placement.

## 2024-03-13 NOTE — ED Provider Notes (Signed)
 I was told by the nursing staff and social worker that patient just needed discharge instruction and that patient was going to another group home and already had prescriptions printed and signed by another doctor   Derrick Ronal BRAVO, MD 03/13/24 [phone]

## 2024-03-13 NOTE — Discharge Instructions (Signed)
 You are being discharged to a new group home. Return to the ER for worsening symptoms or any other concern

## 2024-03-13 NOTE — ED Notes (Signed)
 Pt Breakfast provided at bedside

## 2024-03-13 NOTE — ED Provider Notes (Signed)
 Emergency Medicine Observation Re-evaluation Note  Derrick Atkins is a 53 y.o. male, seen on rounds today.  Pt initially presented to the ED for complaints of Psychiatric Evaluation  Currently, the patient is resting comfortably.  Physical Exam  BP 130/66 (BP Location: Right Arm)   Pulse 92   Temp 97.8 F (36.6 C) (Oral)   Resp 17   SpO2 98%  General: No acute distress Cardiac: Well-perfused extremities Lungs: No respiratory distress Psych: Appropriate mood and affect  ED Course / MDM  EKG:EKG Interpretation Date/Time:  Sunday February 26 2024 12:12:22 EDT Ventricular Rate:  73 PR Interval:  142 QRS Duration:  92 QT Interval:  406 QTC Calculation: 447 R Axis:   33  Text Interpretation: Normal sinus rhythm Right atrial enlargement Borderline ECG When compared with ECG of 20-Feb-2024 17:04, No significant change was found Confirmed by UNCONFIRMED, DOCTOR ([id number]), editor Alex Slough [phone]) on 02/27/2024 9:38:38 AM  I have reviewed the labs performed to date as well as medications administered while in observation.  Recent changes in the last 24 hours include none.  Plan  Current plan is for placement.   Jossie Artist POUR, MD 03/13/24 [phone]

## 2024-03-13 NOTE — ED Notes (Signed)
 DSS here to pick up pt to take to new facility.

## 2024-03-13 NOTE — ED Notes (Signed)
 Informed by [facility] Case Manager RN that [facility] would be here around 0930 to take pt to new [facility].
